# Patient Record
Sex: Female | Born: 1945
Health system: Southern US, Community
[De-identification: ages and names within clinical notes are randomized; demographics above are authoritative.]

## PROBLEM LIST (undated history)

## (undated) DIAGNOSIS — D751 Secondary polycythemia: Secondary | ICD-10-CM

## (undated) DIAGNOSIS — F329 Major depressive disorder, single episode, unspecified: Secondary | ICD-10-CM

## (undated) DIAGNOSIS — N1330 Unspecified hydronephrosis: Secondary | ICD-10-CM

## (undated) DIAGNOSIS — F419 Anxiety disorder, unspecified: Secondary | ICD-10-CM

## (undated) DIAGNOSIS — M549 Dorsalgia, unspecified: Secondary | ICD-10-CM

## (undated) DIAGNOSIS — M5020 Other cervical disc displacement, unspecified cervical region: Secondary | ICD-10-CM

## (undated) DIAGNOSIS — Z720 Tobacco use: Secondary | ICD-10-CM

## (undated) DIAGNOSIS — I10 Essential (primary) hypertension: Secondary | ICD-10-CM

## (undated) DIAGNOSIS — I519 Heart disease, unspecified: Secondary | ICD-10-CM

## (undated) DIAGNOSIS — I2510 Atherosclerotic heart disease of native coronary artery without angina pectoris: Secondary | ICD-10-CM

## (undated) DIAGNOSIS — I739 Peripheral vascular disease, unspecified: Secondary | ICD-10-CM

## (undated) DIAGNOSIS — E7800 Pure hypercholesterolemia, unspecified: Secondary | ICD-10-CM

## (undated) DIAGNOSIS — K635 Polyp of colon: Secondary | ICD-10-CM

## (undated) DIAGNOSIS — R7301 Impaired fasting glucose: Secondary | ICD-10-CM

## (undated) DIAGNOSIS — F32A Depression, unspecified: Secondary | ICD-10-CM

## (undated) DIAGNOSIS — K219 Gastro-esophageal reflux disease without esophagitis: Secondary | ICD-10-CM

## (undated) DIAGNOSIS — R7309 Other abnormal glucose: Secondary | ICD-10-CM

## (undated) DIAGNOSIS — B009 Herpesviral infection, unspecified: Secondary | ICD-10-CM

## (undated) HISTORY — DX: Unspecified hydronephrosis: N13.30

## (undated) HISTORY — PX: ABDOMINAL HYSTERECTOMY: SHX81

## (undated) HISTORY — DX: Heart disease, unspecified: I51.9

## (undated) HISTORY — DX: Anxiety disorder, unspecified: F41.9

## (undated) HISTORY — PX: PARTIAL HYSTERECTOMY: SHX80

## (undated) HISTORY — DX: Pure hypercholesterolemia, unspecified: E78.00

## (undated) HISTORY — DX: Essential (primary) hypertension: I10

## (undated) HISTORY — PX: CHOLECYSTECTOMY: SHX55

## (undated) HISTORY — DX: Dorsalgia, unspecified: M54.9

## (undated) HISTORY — DX: Polyp of colon: K63.5

## (undated) HISTORY — DX: Other abnormal glucose: R73.09

## (undated) HISTORY — DX: Major depressive disorder, single episode, unspecified: F32.9

## (undated) HISTORY — PX: OTHER SURGICAL HISTORY: SHX169

## (undated) HISTORY — DX: Secondary polycythemia: D75.1

## (undated) HISTORY — DX: Herpesviral infection, unspecified: B00.9

## (undated) HISTORY — DX: Atherosclerotic heart disease of native coronary artery without angina pectoris: I25.10

## (undated) HISTORY — DX: Impaired fasting glucose: R73.01

## (undated) HISTORY — DX: Other cervical disc displacement, unspecified cervical region: M50.20

## (undated) HISTORY — DX: Gastro-esophageal reflux disease without esophagitis: K21.9

## (undated) HISTORY — DX: Depression, unspecified: F32.A

## (undated) HISTORY — DX: Peripheral vascular disease, unspecified: I73.9

## (undated) HISTORY — DX: Tobacco use: Z72.0

## (undated) SURGERY — Surgical Case
Anesthesia: *Unknown

---

## 1973-03-25 HISTORY — PX: TOTAL ABDOMINAL HYSTERECTOMY W/ BILATERAL SALPINGOOPHORECTOMY: SHX83

## 1998-03-25 HISTORY — PX: OTHER SURGICAL HISTORY: SHX169

## 2000-10-08 ENCOUNTER — Emergency Department (HOSPITAL_COMMUNITY): Admission: EM | Admit: 2000-10-08 | Discharge: 2000-10-09 | Payer: Self-pay | Admitting: Emergency Medicine

## 2001-01-07 ENCOUNTER — Other Ambulatory Visit: Admission: RE | Admit: 2001-01-07 | Discharge: 2001-01-07 | Payer: Self-pay | Admitting: Family Medicine

## 2001-01-09 ENCOUNTER — Encounter: Payer: Self-pay | Admitting: Family Medicine

## 2001-01-09 ENCOUNTER — Ambulatory Visit (HOSPITAL_COMMUNITY): Admission: RE | Admit: 2001-01-09 | Discharge: 2001-01-09 | Payer: Self-pay | Admitting: Family Medicine

## 2001-03-18 ENCOUNTER — Emergency Department (HOSPITAL_COMMUNITY): Admission: EM | Admit: 2001-03-18 | Discharge: 2001-03-18 | Payer: Self-pay | Admitting: Emergency Medicine

## 2001-03-18 ENCOUNTER — Encounter: Payer: Self-pay | Admitting: Emergency Medicine

## 2001-07-05 ENCOUNTER — Emergency Department (HOSPITAL_COMMUNITY): Admission: EM | Admit: 2001-07-05 | Discharge: 2001-07-05 | Payer: Self-pay | Admitting: Emergency Medicine

## 2001-07-17 ENCOUNTER — Emergency Department (HOSPITAL_COMMUNITY): Admission: EM | Admit: 2001-07-17 | Discharge: 2001-07-17 | Payer: Self-pay | Admitting: Internal Medicine

## 2001-11-10 ENCOUNTER — Ambulatory Visit (HOSPITAL_COMMUNITY): Admission: RE | Admit: 2001-11-10 | Discharge: 2001-11-10 | Payer: Self-pay | Admitting: General Surgery

## 2001-11-15 ENCOUNTER — Emergency Department (HOSPITAL_COMMUNITY): Admission: EM | Admit: 2001-11-15 | Discharge: 2001-11-15 | Payer: Self-pay | Admitting: Internal Medicine

## 2002-03-22 ENCOUNTER — Encounter: Payer: Self-pay | Admitting: Family Medicine

## 2002-03-22 ENCOUNTER — Ambulatory Visit (HOSPITAL_COMMUNITY): Admission: RE | Admit: 2002-03-22 | Discharge: 2002-03-22 | Payer: Self-pay | Admitting: Family Medicine

## 2002-04-09 ENCOUNTER — Encounter: Payer: Self-pay | Admitting: Family Medicine

## 2002-04-09 ENCOUNTER — Ambulatory Visit (HOSPITAL_COMMUNITY): Admission: RE | Admit: 2002-04-09 | Discharge: 2002-04-09 | Payer: Self-pay | Admitting: Family Medicine

## 2002-05-04 ENCOUNTER — Emergency Department (HOSPITAL_COMMUNITY): Admission: EM | Admit: 2002-05-04 | Discharge: 2002-05-04 | Payer: Self-pay | Admitting: *Deleted

## 2002-05-04 ENCOUNTER — Encounter: Payer: Self-pay | Admitting: *Deleted

## 2002-05-05 ENCOUNTER — Emergency Department (HOSPITAL_COMMUNITY): Admission: EM | Admit: 2002-05-05 | Discharge: 2002-05-05 | Payer: Self-pay | Admitting: *Deleted

## 2002-05-05 ENCOUNTER — Encounter: Payer: Self-pay | Admitting: *Deleted

## 2002-07-19 ENCOUNTER — Encounter (HOSPITAL_COMMUNITY): Admission: RE | Admit: 2002-07-19 | Discharge: 2002-08-18 | Payer: Self-pay | Admitting: Family Medicine

## 2002-09-10 ENCOUNTER — Emergency Department (HOSPITAL_COMMUNITY): Admission: EM | Admit: 2002-09-10 | Discharge: 2002-09-10 | Payer: Self-pay | Admitting: *Deleted

## 2002-09-10 ENCOUNTER — Encounter: Payer: Self-pay | Admitting: Emergency Medicine

## 2002-09-12 ENCOUNTER — Encounter: Payer: Self-pay | Admitting: *Deleted

## 2002-09-12 ENCOUNTER — Emergency Department (HOSPITAL_COMMUNITY): Admission: EM | Admit: 2002-09-12 | Discharge: 2002-09-12 | Payer: Self-pay | Admitting: *Deleted

## 2002-09-17 ENCOUNTER — Ambulatory Visit (HOSPITAL_COMMUNITY): Admission: RE | Admit: 2002-09-17 | Discharge: 2002-09-17 | Payer: Self-pay | Admitting: Family Medicine

## 2002-09-17 ENCOUNTER — Encounter: Payer: Self-pay | Admitting: Family Medicine

## 2002-10-10 ENCOUNTER — Emergency Department (HOSPITAL_COMMUNITY): Admission: EM | Admit: 2002-10-10 | Discharge: 2002-10-10 | Payer: Self-pay | Admitting: Emergency Medicine

## 2002-10-10 ENCOUNTER — Encounter: Payer: Self-pay | Admitting: Emergency Medicine

## 2002-11-05 ENCOUNTER — Ambulatory Visit (HOSPITAL_COMMUNITY): Admission: RE | Admit: 2002-11-05 | Discharge: 2002-11-05 | Payer: Self-pay | Admitting: Urology

## 2002-11-05 ENCOUNTER — Encounter: Payer: Self-pay | Admitting: Urology

## 2003-02-14 ENCOUNTER — Inpatient Hospital Stay (HOSPITAL_COMMUNITY): Admission: EM | Admit: 2003-02-14 | Discharge: 2003-02-16 | Payer: Self-pay | Admitting: Emergency Medicine

## 2003-05-10 ENCOUNTER — Emergency Department (HOSPITAL_COMMUNITY): Admission: EM | Admit: 2003-05-10 | Discharge: 2003-05-10 | Payer: Self-pay | Admitting: Emergency Medicine

## 2003-07-28 ENCOUNTER — Ambulatory Visit (HOSPITAL_COMMUNITY): Admission: RE | Admit: 2003-07-28 | Discharge: 2003-07-28 | Payer: Self-pay | Admitting: Cardiology

## 2003-09-01 ENCOUNTER — Ambulatory Visit (HOSPITAL_COMMUNITY): Admission: RE | Admit: 2003-09-01 | Discharge: 2003-09-01 | Payer: Self-pay | Admitting: Family Medicine

## 2004-01-21 ENCOUNTER — Emergency Department (HOSPITAL_COMMUNITY): Admission: EM | Admit: 2004-01-21 | Discharge: 2004-01-21 | Payer: Self-pay | Admitting: Emergency Medicine

## 2004-03-06 ENCOUNTER — Ambulatory Visit: Payer: Self-pay | Admitting: Family Medicine

## 2004-04-07 ENCOUNTER — Emergency Department (HOSPITAL_COMMUNITY): Admission: EM | Admit: 2004-04-07 | Discharge: 2004-04-07 | Payer: Self-pay | Admitting: Emergency Medicine

## 2004-04-10 ENCOUNTER — Ambulatory Visit: Payer: Self-pay | Admitting: Family Medicine

## 2004-04-27 ENCOUNTER — Ambulatory Visit (HOSPITAL_COMMUNITY): Admission: RE | Admit: 2004-04-27 | Discharge: 2004-04-27 | Payer: Self-pay | Admitting: Family Medicine

## 2004-05-11 ENCOUNTER — Ambulatory Visit: Payer: Self-pay | Admitting: Family Medicine

## 2004-06-17 ENCOUNTER — Emergency Department (HOSPITAL_COMMUNITY): Admission: EM | Admit: 2004-06-17 | Discharge: 2004-06-17 | Payer: Self-pay | Admitting: Emergency Medicine

## 2004-07-10 ENCOUNTER — Ambulatory Visit: Payer: Self-pay | Admitting: Family Medicine

## 2004-07-30 ENCOUNTER — Emergency Department (HOSPITAL_COMMUNITY): Admission: EM | Admit: 2004-07-30 | Discharge: 2004-07-30 | Payer: Self-pay | Admitting: Emergency Medicine

## 2004-08-01 ENCOUNTER — Ambulatory Visit: Payer: Self-pay | Admitting: Family Medicine

## 2004-08-07 ENCOUNTER — Ambulatory Visit (HOSPITAL_COMMUNITY): Admission: RE | Admit: 2004-08-07 | Discharge: 2004-08-07 | Payer: Self-pay | Admitting: Family Medicine

## 2004-08-09 ENCOUNTER — Ambulatory Visit (HOSPITAL_COMMUNITY): Admission: RE | Admit: 2004-08-09 | Discharge: 2004-08-09 | Payer: Self-pay | Admitting: Family Medicine

## 2004-10-31 ENCOUNTER — Emergency Department (HOSPITAL_COMMUNITY): Admission: EM | Admit: 2004-10-31 | Discharge: 2004-10-31 | Payer: Self-pay | Admitting: Emergency Medicine

## 2004-12-13 ENCOUNTER — Ambulatory Visit: Payer: Self-pay | Admitting: Family Medicine

## 2005-02-04 ENCOUNTER — Ambulatory Visit: Payer: Self-pay | Admitting: Family Medicine

## 2005-03-01 ENCOUNTER — Ambulatory Visit: Payer: Self-pay | Admitting: Cardiology

## 2005-03-01 ENCOUNTER — Inpatient Hospital Stay (HOSPITAL_COMMUNITY): Admission: AD | Admit: 2005-03-01 | Discharge: 2005-03-02 | Payer: Self-pay | Admitting: Cardiology

## 2005-03-01 ENCOUNTER — Encounter: Payer: Self-pay | Admitting: Emergency Medicine

## 2005-03-06 ENCOUNTER — Ambulatory Visit: Payer: Self-pay | Admitting: Family Medicine

## 2005-04-05 ENCOUNTER — Ambulatory Visit: Payer: Self-pay | Admitting: Internal Medicine

## 2005-05-17 ENCOUNTER — Ambulatory Visit: Payer: Self-pay | Admitting: Family Medicine

## 2005-07-05 ENCOUNTER — Other Ambulatory Visit: Admission: RE | Admit: 2005-07-05 | Discharge: 2005-07-05 | Payer: Self-pay | Admitting: Family Medicine

## 2005-07-05 ENCOUNTER — Encounter: Payer: Self-pay | Admitting: Family Medicine

## 2005-07-05 ENCOUNTER — Ambulatory Visit: Payer: Self-pay | Admitting: Family Medicine

## 2005-08-05 ENCOUNTER — Ambulatory Visit (HOSPITAL_COMMUNITY): Admission: RE | Admit: 2005-08-05 | Discharge: 2005-08-05 | Payer: Self-pay | Admitting: Podiatry

## 2005-10-31 ENCOUNTER — Ambulatory Visit: Payer: Self-pay | Admitting: Family Medicine

## 2006-01-06 ENCOUNTER — Ambulatory Visit: Payer: Self-pay | Admitting: Family Medicine

## 2006-02-06 ENCOUNTER — Ambulatory Visit: Payer: Self-pay | Admitting: Family Medicine

## 2006-05-26 ENCOUNTER — Ambulatory Visit: Payer: Self-pay | Admitting: Family Medicine

## 2006-06-05 ENCOUNTER — Encounter: Payer: Self-pay | Admitting: Family Medicine

## 2006-06-05 ENCOUNTER — Ambulatory Visit (HOSPITAL_COMMUNITY): Admission: RE | Admit: 2006-06-05 | Discharge: 2006-06-05 | Payer: Self-pay | Admitting: Family Medicine

## 2006-06-05 LAB — CONVERTED CEMR LAB
Albumin: 4.5 g/dL (ref 3.5–5.2)
Alkaline Phosphatase: 115 units/L (ref 39–117)
BUN: 13 mg/dL (ref 6–23)
CO2: 27 meq/L (ref 19–32)
Total Bilirubin: 0.5 mg/dL (ref 0.3–1.2)
Triglycerides: 201 mg/dL — ABNORMAL HIGH (ref <150)

## 2006-06-23 ENCOUNTER — Ambulatory Visit (HOSPITAL_COMMUNITY): Admission: RE | Admit: 2006-06-23 | Discharge: 2006-06-23 | Payer: Self-pay | Admitting: Gastroenterology

## 2006-06-23 ENCOUNTER — Ambulatory Visit: Payer: Self-pay | Admitting: Gastroenterology

## 2006-06-23 HISTORY — PX: COLONOSCOPY: SHX174

## 2006-06-23 LAB — HM COLONOSCOPY: HM Colonoscopy: NORMAL

## 2006-09-29 ENCOUNTER — Ambulatory Visit: Payer: Self-pay | Admitting: Family Medicine

## 2006-10-13 ENCOUNTER — Ambulatory Visit (HOSPITAL_COMMUNITY): Admission: RE | Admit: 2006-10-13 | Discharge: 2006-10-13 | Payer: Self-pay | Admitting: Family Medicine

## 2006-11-28 ENCOUNTER — Encounter: Payer: Self-pay | Admitting: Family Medicine

## 2006-11-28 LAB — CONVERTED CEMR LAB
AST: 17 units/L (ref 0–37)
Albumin: 4.7 g/dL (ref 3.5–5.2)
Alkaline Phosphatase: 108 units/L (ref 39–117)
Bilirubin, Direct: 0.1 mg/dL (ref 0.0–0.3)
CO2: 25 meq/L (ref 19–32)
Cholesterol: 139 mg/dL (ref 0–200)
Creatinine, Ser: 0.92 mg/dL (ref 0.40–1.20)
Glucose, Bld: 98 mg/dL (ref 70–99)
Indirect Bilirubin: 0.4 mg/dL (ref 0.0–0.9)
Total Bilirubin: 0.5 mg/dL (ref 0.3–1.2)
Total CHOL/HDL Ratio: 3.7
Triglycerides: 254 mg/dL — ABNORMAL HIGH (ref <150)
VLDL: 51 mg/dL — ABNORMAL HIGH (ref 0–40)

## 2006-12-02 ENCOUNTER — Ambulatory Visit: Payer: Self-pay | Admitting: Family Medicine

## 2007-01-23 ENCOUNTER — Ambulatory Visit: Payer: Self-pay | Admitting: Family Medicine

## 2007-01-23 LAB — CONVERTED CEMR LAB: Troponin I: 0.04 ng/mL (ref <0.06)

## 2007-03-05 ENCOUNTER — Ambulatory Visit: Payer: Self-pay | Admitting: Family Medicine

## 2007-03-05 LAB — CONVERTED CEMR LAB
BUN: 12 mg/dL (ref 6–23)
CO2: 24 meq/L (ref 19–32)
Chloride: 102 meq/L (ref 96–112)
Cholesterol: 148 mg/dL (ref 0–200)
Glucose, Bld: 98 mg/dL (ref 70–99)
HDL: 54 mg/dL (ref >39)
LDL Cholesterol: 65 mg/dL (ref 0–99)
Potassium: 3.8 meq/L (ref 3.5–5.3)
Total Bilirubin: 0.5 mg/dL (ref 0.3–1.2)
Total CHOL/HDL Ratio: 2.7
Triglycerides: 147 mg/dL (ref <150)

## 2007-03-26 ENCOUNTER — Encounter: Payer: Self-pay | Admitting: Family Medicine

## 2007-06-03 ENCOUNTER — Ambulatory Visit: Payer: Self-pay | Admitting: Family Medicine

## 2007-06-03 LAB — CONVERTED CEMR LAB: Retic Ct Pct: 1.6 % (ref 0.4–3.1)

## 2007-06-12 ENCOUNTER — Encounter: Payer: Self-pay | Admitting: Family Medicine

## 2007-06-12 DIAGNOSIS — F329 Major depressive disorder, single episode, unspecified: Secondary | ICD-10-CM

## 2007-06-12 DIAGNOSIS — E785 Hyperlipidemia, unspecified: Secondary | ICD-10-CM

## 2007-06-12 DIAGNOSIS — M810 Age-related osteoporosis without current pathological fracture: Secondary | ICD-10-CM

## 2007-06-12 DIAGNOSIS — I10 Essential (primary) hypertension: Secondary | ICD-10-CM

## 2007-06-12 DIAGNOSIS — F419 Anxiety disorder, unspecified: Secondary | ICD-10-CM

## 2007-06-12 DIAGNOSIS — M549 Dorsalgia, unspecified: Secondary | ICD-10-CM | POA: Insufficient documentation

## 2007-09-07 ENCOUNTER — Ambulatory Visit: Payer: Self-pay | Admitting: Family Medicine

## 2007-09-07 ENCOUNTER — Encounter: Payer: Self-pay | Admitting: Family Medicine

## 2007-09-07 DIAGNOSIS — J301 Allergic rhinitis due to pollen: Secondary | ICD-10-CM | POA: Insufficient documentation

## 2007-09-07 LAB — CONVERTED CEMR LAB
ALT: 16 units/L (ref 0–35)
Albumin: 5 g/dL (ref 3.5–5.2)
Alkaline Phosphatase: 87 units/L (ref 39–117)
Chloride: 102 meq/L (ref 96–112)
Creatinine, Ser: 0.85 mg/dL (ref 0.40–1.20)
Glucose, Bld: 97 mg/dL (ref 70–99)
Indirect Bilirubin: 0.3 mg/dL (ref 0.0–0.9)
Potassium: 4.1 meq/L (ref 3.5–5.3)
Triglycerides: 203 mg/dL — ABNORMAL HIGH (ref <150)
VLDL: 41 mg/dL — ABNORMAL HIGH (ref 0–40)

## 2007-11-27 ENCOUNTER — Encounter: Payer: Self-pay | Admitting: Family Medicine

## 2007-12-08 ENCOUNTER — Ambulatory Visit: Payer: Self-pay | Admitting: Family Medicine

## 2007-12-09 ENCOUNTER — Encounter: Payer: Self-pay | Admitting: Family Medicine

## 2007-12-09 LAB — CONVERTED CEMR LAB
AST: 17 units/L (ref 0–37)
BUN: 13 mg/dL (ref 6–23)
Basophils Absolute: 0.1 10*3/uL (ref 0.0–0.1)
Basophils Relative: 1 % (ref 0–1)
Bilirubin, Direct: 0.1 mg/dL (ref 0.0–0.3)
CO2: 26 meq/L (ref 19–32)
Calcium: 11.3 mg/dL — ABNORMAL HIGH (ref 8.4–10.5)
Eosinophils Absolute: 0.2 10*3/uL (ref 0.0–0.7)
Eosinophils Relative: 2 % (ref 0–5)
Glucose, Bld: 108 mg/dL — ABNORMAL HIGH (ref 70–99)
HCT: 52.7 % — ABNORMAL HIGH (ref 36.0–46.0)
Hemoglobin: 17.5 g/dL — ABNORMAL HIGH (ref 12.0–15.0)
MCHC: 33.2 g/dL (ref 30.0–36.0)
MCV: 98.3 fL (ref 78.0–100.0)
Monocytes Absolute: 0.7 10*3/uL (ref 0.1–1.0)
Monocytes Relative: 6 % (ref 3–12)
Neutrophils Relative %: 56 % (ref 43–77)
RDW: 15 % (ref 11.5–15.5)
Sodium: 141 meq/L (ref 135–145)
Total Bilirubin: 0.5 mg/dL (ref 0.3–1.2)
Total CHOL/HDL Ratio: 6.9
WBC: 10.3 10*3/uL (ref 4.0–10.5)

## 2007-12-16 ENCOUNTER — Encounter: Payer: Self-pay | Admitting: Family Medicine

## 2007-12-16 ENCOUNTER — Ambulatory Visit: Payer: Self-pay | Admitting: Cardiology

## 2008-01-22 ENCOUNTER — Encounter: Payer: Self-pay | Admitting: Family Medicine

## 2008-01-25 ENCOUNTER — Telehealth: Payer: Self-pay | Admitting: Family Medicine

## 2008-04-12 ENCOUNTER — Ambulatory Visit: Payer: Self-pay | Admitting: Family Medicine

## 2008-04-13 ENCOUNTER — Encounter: Payer: Self-pay | Admitting: Family Medicine

## 2008-04-13 LAB — CONVERTED CEMR LAB
ALT: 17 units/L (ref 0–35)
AST: 19 units/L (ref 0–37)
Albumin: 4.6 g/dL (ref 3.5–5.2)
Alkaline Phosphatase: 126 units/L — ABNORMAL HIGH (ref 39–117)
BUN: 13 mg/dL (ref 6–23)
Bilirubin, Direct: 0.1 mg/dL (ref 0.0–0.3)
CO2: 25 meq/L (ref 19–32)
Calcium: 10.1 mg/dL (ref 8.4–10.5)
Cholesterol: 206 mg/dL — ABNORMAL HIGH (ref 0–200)
Total Bilirubin: 0.6 mg/dL (ref 0.3–1.2)
Triglycerides: 254 mg/dL — ABNORMAL HIGH (ref <150)

## 2008-04-18 ENCOUNTER — Encounter: Payer: Self-pay | Admitting: Family Medicine

## 2008-04-18 ENCOUNTER — Ambulatory Visit: Payer: Self-pay | Admitting: Cardiology

## 2008-04-25 ENCOUNTER — Ambulatory Visit: Payer: Self-pay

## 2008-04-25 ENCOUNTER — Encounter: Payer: Self-pay | Admitting: Family Medicine

## 2008-04-28 ENCOUNTER — Ambulatory Visit: Payer: Self-pay | Admitting: Cardiology

## 2008-04-28 ENCOUNTER — Encounter (HOSPITAL_COMMUNITY): Admission: RE | Admit: 2008-04-28 | Discharge: 2008-05-28 | Payer: Self-pay | Admitting: Cardiology

## 2008-04-28 ENCOUNTER — Encounter: Payer: Self-pay | Admitting: Cardiology

## 2008-05-10 ENCOUNTER — Ambulatory Visit: Payer: Self-pay | Admitting: Cardiology

## 2008-06-20 ENCOUNTER — Encounter: Payer: Self-pay | Admitting: Family Medicine

## 2008-06-21 ENCOUNTER — Encounter: Payer: Self-pay | Admitting: Family Medicine

## 2008-06-23 ENCOUNTER — Encounter: Payer: Self-pay | Admitting: Family Medicine

## 2008-07-12 ENCOUNTER — Ambulatory Visit: Payer: Self-pay | Admitting: Family Medicine

## 2008-07-12 ENCOUNTER — Telehealth: Payer: Self-pay | Admitting: Family Medicine

## 2008-07-18 ENCOUNTER — Encounter: Payer: Self-pay | Admitting: Family Medicine

## 2008-07-18 ENCOUNTER — Ambulatory Visit (HOSPITAL_COMMUNITY): Admission: RE | Admit: 2008-07-18 | Discharge: 2008-07-18 | Payer: Self-pay | Admitting: Family Medicine

## 2008-08-23 ENCOUNTER — Inpatient Hospital Stay (HOSPITAL_COMMUNITY): Admission: EM | Admit: 2008-08-23 | Discharge: 2008-08-26 | Payer: Self-pay | Admitting: Emergency Medicine

## 2008-08-23 ENCOUNTER — Ambulatory Visit: Payer: Self-pay | Admitting: Family Medicine

## 2008-09-01 ENCOUNTER — Ambulatory Visit: Payer: Self-pay | Admitting: Family Medicine

## 2008-09-05 ENCOUNTER — Ambulatory Visit: Payer: Self-pay | Admitting: Family Medicine

## 2008-09-05 ENCOUNTER — Telehealth: Payer: Self-pay | Admitting: Family Medicine

## 2008-09-12 ENCOUNTER — Encounter: Payer: Self-pay | Admitting: Family Medicine

## 2008-09-22 ENCOUNTER — Telehealth: Payer: Self-pay | Admitting: Family Medicine

## 2008-11-14 ENCOUNTER — Encounter: Payer: Self-pay | Admitting: Family Medicine

## 2008-12-15 ENCOUNTER — Encounter: Payer: Self-pay | Admitting: Family Medicine

## 2008-12-28 ENCOUNTER — Encounter (INDEPENDENT_AMBULATORY_CARE_PROVIDER_SITE_OTHER): Payer: Self-pay | Admitting: *Deleted

## 2008-12-28 ENCOUNTER — Ambulatory Visit: Payer: Self-pay | Admitting: Family Medicine

## 2008-12-28 LAB — CONVERTED CEMR LAB
AST: 19 units/L
Alkaline Phosphatase: 133 units/L
BUN: 15 mg/dL
Creatinine, Ser: 0.77 mg/dL
Eosinophils Absolute: 0.1 10*3/uL
Glucose, Bld: 94 mg/dL
HCT: 52 %
HDL: 44 mg/dL
Lymphocytes Relative: 43 %
MCV: 95.4 fL
Monocytes Absolute: 1 10*3/uL
Monocytes Relative: 7 %
Potassium: 5.2 meq/L
Sodium: 142 meq/L
Triglycerides: 290 mg/dL
WBC: 10.1 10*3/uL

## 2008-12-29 ENCOUNTER — Encounter: Payer: Self-pay | Admitting: Family Medicine

## 2008-12-30 HISTORY — DX: Hypercalcemia: E83.52

## 2008-12-30 LAB — CONVERTED CEMR LAB
Albumin: 4.7 g/dL (ref 3.5–5.2)
Alkaline Phosphatase: 133 units/L — ABNORMAL HIGH (ref 39–117)
CO2: 24 meq/L (ref 19–32)
Chloride: 104 meq/L (ref 96–112)
Creatinine, Ser: 0.77 mg/dL (ref 0.40–1.20)
Eosinophils Absolute: 0.1 10*3/uL (ref 0.0–0.7)
Eosinophils Relative: 1 % (ref 0–5)
HCT: 52 % — ABNORMAL HIGH (ref 36.0–46.0)
HDL: 44 mg/dL (ref >39)
Indirect Bilirubin: 0.3 mg/dL (ref 0.0–0.9)
Lymphocytes Relative: 43 % (ref 12–46)
Monocytes Absolute: 0.7 10*3/uL (ref 0.1–1.0)
Monocytes Relative: 7 % (ref 3–12)
Neutro Abs: 4.8 10*3/uL (ref 1.7–7.7)
Neutrophils Relative %: 48 % (ref 43–77)
RBC: 5.45 M/uL — ABNORMAL HIGH (ref 3.87–5.11)
RDW: 14.8 % (ref 11.5–15.5)
Total Bilirubin: 0.4 mg/dL (ref 0.3–1.2)
Total Protein: 7.8 g/dL (ref 6.0–8.3)
Triglycerides: 290 mg/dL — ABNORMAL HIGH (ref <150)
WBC: 10.1 10*3/uL (ref 4.0–10.5)

## 2009-01-04 LAB — CONVERTED CEMR LAB: PTH: 105.6 pg/mL — ABNORMAL HIGH (ref 14.0–72.0)

## 2009-01-06 ENCOUNTER — Telehealth: Payer: Self-pay | Admitting: Family Medicine

## 2009-01-20 ENCOUNTER — Ambulatory Visit: Payer: Self-pay | Admitting: Endocrinology

## 2009-01-20 DIAGNOSIS — E559 Vitamin D deficiency, unspecified: Secondary | ICD-10-CM

## 2009-01-20 DIAGNOSIS — E210 Primary hyperparathyroidism: Secondary | ICD-10-CM

## 2009-01-23 LAB — CONVERTED CEMR LAB: PTH: 78.4 pg/mL — ABNORMAL HIGH (ref 14.0–72.0)

## 2009-02-18 ENCOUNTER — Emergency Department (HOSPITAL_COMMUNITY): Admission: EM | Admit: 2009-02-18 | Discharge: 2009-02-18 | Payer: Self-pay | Admitting: Emergency Medicine

## 2009-04-07 ENCOUNTER — Ambulatory Visit: Payer: Self-pay | Admitting: Family Medicine

## 2009-04-07 DIAGNOSIS — R1310 Dysphagia, unspecified: Secondary | ICD-10-CM | POA: Insufficient documentation

## 2009-04-07 DIAGNOSIS — R1900 Intra-abdominal and pelvic swelling, mass and lump, unspecified site: Secondary | ICD-10-CM | POA: Insufficient documentation

## 2009-04-10 ENCOUNTER — Telehealth: Payer: Self-pay | Admitting: Family Medicine

## 2009-04-12 ENCOUNTER — Encounter (INDEPENDENT_AMBULATORY_CARE_PROVIDER_SITE_OTHER): Payer: Self-pay | Admitting: *Deleted

## 2009-04-12 LAB — CONVERTED CEMR LAB
ALT: 19 units/L
ALT: 19 units/L (ref 0–35)
Alkaline Phosphatase: 114 units/L
Alkaline Phosphatase: 114 units/L (ref 39–117)
BUN: 10 mg/dL
BUN: 10 mg/dL (ref 6–23)
Bilirubin, Direct: 0.1 mg/dL
Bilirubin, Direct: 0.1 mg/dL (ref 0.0–0.3)
CO2: 25 meq/L
CO2: 25 meq/L (ref 19–32)
Calcium: 9.9 mg/dL
Calcium: 9.9 mg/dL (ref 8.4–10.5)
Chloride: 106 meq/L (ref 96–112)
Cholesterol: 156 mg/dL (ref 0–200)
Creatinine, Ser: 0.79 mg/dL
LDL Cholesterol: 65 mg/dL (ref 0–99)
Potassium: 4 meq/L
Potassium: 4 meq/L (ref 3.5–5.3)
Total Bilirubin: 0.4 mg/dL (ref 0.3–1.2)
Triglycerides: 257 mg/dL
Triglycerides: 257 mg/dL — ABNORMAL HIGH (ref <150)
Vit D, 25-Hydroxy: 40 ng/mL (ref 30–89)

## 2009-06-10 ENCOUNTER — Emergency Department (HOSPITAL_COMMUNITY): Admission: EM | Admit: 2009-06-10 | Discharge: 2009-06-10 | Payer: Self-pay | Admitting: Emergency Medicine

## 2009-07-10 ENCOUNTER — Ambulatory Visit: Payer: Self-pay | Admitting: Family Medicine

## 2009-07-17 ENCOUNTER — Telehealth: Payer: Self-pay | Admitting: Family Medicine

## 2009-08-14 ENCOUNTER — Telehealth: Payer: Self-pay | Admitting: Family Medicine

## 2009-09-13 ENCOUNTER — Encounter (INDEPENDENT_AMBULATORY_CARE_PROVIDER_SITE_OTHER): Payer: Self-pay | Admitting: *Deleted

## 2009-09-17 DIAGNOSIS — K219 Gastro-esophageal reflux disease without esophagitis: Secondary | ICD-10-CM

## 2009-09-17 DIAGNOSIS — F17200 Nicotine dependence, unspecified, uncomplicated: Secondary | ICD-10-CM

## 2009-09-17 DIAGNOSIS — I739 Peripheral vascular disease, unspecified: Secondary | ICD-10-CM | POA: Insufficient documentation

## 2009-09-17 DIAGNOSIS — I2510 Atherosclerotic heart disease of native coronary artery without angina pectoris: Secondary | ICD-10-CM

## 2009-09-18 ENCOUNTER — Ambulatory Visit: Payer: Self-pay | Admitting: Cardiology

## 2009-09-18 DIAGNOSIS — R7309 Other abnormal glucose: Secondary | ICD-10-CM

## 2009-10-10 ENCOUNTER — Ambulatory Visit: Payer: Self-pay | Admitting: Family Medicine

## 2009-10-11 ENCOUNTER — Encounter: Payer: Self-pay | Admitting: Family Medicine

## 2009-10-12 ENCOUNTER — Encounter: Payer: Self-pay | Admitting: Family Medicine

## 2009-10-12 LAB — CONVERTED CEMR LAB
AST: 17 units/L (ref 0–37)
Albumin: 4.6 g/dL (ref 3.5–5.2)
BUN: 10 mg/dL (ref 6–23)
Cholesterol: 317 mg/dL — ABNORMAL HIGH (ref 0–200)
Creatinine, Ser: 0.83 mg/dL (ref 0.40–1.20)
Glucose, Bld: 104 mg/dL — ABNORMAL HIGH (ref 70–99)
Indirect Bilirubin: 0.3 mg/dL (ref 0.0–0.9)
Potassium: 4.3 meq/L (ref 3.5–5.3)
Sodium: 141 meq/L (ref 135–145)
Total CHOL/HDL Ratio: 7.7

## 2009-10-13 ENCOUNTER — Ambulatory Visit (HOSPITAL_COMMUNITY): Admission: RE | Admit: 2009-10-13 | Discharge: 2009-10-13 | Payer: Self-pay | Admitting: Family Medicine

## 2010-01-01 ENCOUNTER — Telehealth: Payer: Self-pay | Admitting: Family Medicine

## 2010-01-17 ENCOUNTER — Ambulatory Visit: Payer: Self-pay | Admitting: Family Medicine

## 2010-02-06 ENCOUNTER — Telehealth: Payer: Self-pay | Admitting: Family Medicine

## 2010-03-13 ENCOUNTER — Encounter (INDEPENDENT_AMBULATORY_CARE_PROVIDER_SITE_OTHER): Payer: Self-pay | Admitting: *Deleted

## 2010-03-13 LAB — CONVERTED CEMR LAB
AST: 21 units/L
Albumin: 4.7 g/dL
Alkaline Phosphatase: 127 units/L
Basophils Absolute: 0.1 10*3/uL
Bilirubin, Direct: 0.1 mg/dL
Cholesterol: 245 mg/dL
Eosinophils Relative: 1 %
Glucose, Bld: 107 mg/dL
HDL: 41 mg/dL
LDL Cholesterol: 157 mg/dL
Lymphocytes Relative: 46 %
Lymphs Abs: 3.3 10*3/uL
Monocytes Absolute: 0.5 10*3/uL
Monocytes Relative: 8 %
Platelets: 273 10*3/uL
Potassium: 4.1 meq/L
RBC: 5.42 M/uL
Sodium: 140 meq/L
Total Protein: 7.4 g/dL
Triglycerides: 235 mg/dL
WBC: 7.2 10*3/uL

## 2010-03-14 LAB — CONVERTED CEMR LAB
AST: 21 units/L (ref 0–37)
Alkaline Phosphatase: 127 units/L — ABNORMAL HIGH (ref 39–117)
Basophils Relative: 1 % (ref 0–1)
Bilirubin, Direct: 0.1 mg/dL (ref 0.0–0.3)
Chloride: 104 meq/L (ref 96–112)
Cholesterol: 245 mg/dL — ABNORMAL HIGH (ref 0–200)
Eosinophils Absolute: 0.1 10*3/uL (ref 0.0–0.7)
Eosinophils Relative: 1 % (ref 0–5)
HCT: 51.5 % — ABNORMAL HIGH (ref 36.0–46.0)
HDL: 41 mg/dL (ref >39)
LDL Cholesterol: 157 mg/dL — ABNORMAL HIGH (ref 0–99)
MCV: 95 fL (ref 78.0–100.0)
Monocytes Absolute: 0.5 10*3/uL (ref 0.1–1.0)
Monocytes Relative: 8 % (ref 3–12)
Potassium: 4.1 meq/L (ref 3.5–5.3)
RDW: 15 % (ref 11.5–15.5)
Sodium: 140 meq/L (ref 135–145)
TSH: 2.37 microintl units/mL (ref 0.350–4.500)
Total Bilirubin: 0.5 mg/dL (ref 0.3–1.2)
VLDL: 47 mg/dL — ABNORMAL HIGH (ref 0–40)

## 2010-04-14 ENCOUNTER — Encounter: Payer: Self-pay | Admitting: Family Medicine

## 2010-04-15 ENCOUNTER — Encounter: Payer: Self-pay | Admitting: Family Medicine

## 2010-04-15 ENCOUNTER — Encounter: Payer: Self-pay | Admitting: Neurology

## 2010-04-16 ENCOUNTER — Encounter: Payer: Self-pay | Admitting: Family Medicine

## 2010-04-24 NOTE — Letter (Signed)
Summary: PHONE NOTES  PHONE NOTES   Imported By: Lind Guest 09/01/2009 08:58:28  _____________________________________________________________________  External Attachment:    Type:   Image     Comment:   External Document

## 2010-04-24 NOTE — Letter (Signed)
Summary: HISTORY AND PHYSICAL  HISTORY AND PHYSICAL   Imported By: Lind Guest 09/01/2009 08:51:19  _____________________________________________________________________  External Attachment:    Type:   Image     Comment:   External Document

## 2010-04-24 NOTE — Progress Notes (Signed)
Summary: meds  Phone Note Call from Patient   Summary of Call: pt states she has appt with [facility] on the 09/18/2009. she needs her meds. [phone] Initial call taken by: Rudene Anda,  July 17, 2009 8:27 AM  Follow-up for Phone Call        She was wanting her crestor, went to the pharmacy and it wasn't there. Told her [facility] sent it in on the 20th and she said she'd call them back or call them if it wasn't there Follow-up by: Everitt Amber LPN,  July 17, 2009 10:08 AM

## 2010-04-24 NOTE — Letter (Signed)
Summary: OFFICE NOTES  OFFICE NOTES   Imported By: Lind Guest 09/01/2009 08:57:50  _____________________________________________________________________  External Attachment:    Type:   Image     Comment:   External Document

## 2010-04-24 NOTE — Letter (Signed)
Summary: CONSULTS  CONSULTS   Imported By: Lind Guest 09/01/2009 08:48:46  _____________________________________________________________________  External Attachment:    Type:   Image     Comment:   External Document

## 2010-04-24 NOTE — Progress Notes (Signed)
Summary: back pain  Phone Note Call from Patient   Summary of Call: the pain medicine that doc gave her for back isn't doing any good. would like to get something else called into pharm. [phone] Initial call taken by: Rudene Anda,  January 01, 2010 10:32 AM  Follow-up for Phone Call        I advised her that she was already on the highest dose of pain meds. 10/500mg  qid. She is taking them 4 times a day and during the night too. I told her she can come in to be seen because she was already on the max dose. She wants to know if she can have something stronger sent in. I tried to get her to schedule app earlier but she said she can't get up here. Is it Ok to tell her that nothing stronger can be given without OV period? Follow-up by: Everitt Amber LPN,  January 01, 2010 10:47 AM  Additional Follow-up for Phone Call Additional follow up Details #1::        yes, and actually irecommend she see ortho about the back pain,. because I have nothing more to offer, let me know which doc she will see so i can refer Additional Follow-up by: [name],  January 01, 2010 12:12 PM    Additional Follow-up for Phone Call Additional follow up Details #2::    Patient was asleep when I called and they will get her to call me back  Follow-up by: Everitt Amber LPN,  January 01, 2010 3:59 PM  Additional Follow-up for Phone Call Additional follow up Details #3:: Details for Additional Follow-up Action Taken: wants referral to ortho Additional Follow-up by: Everitt Amber LPN,  January 02, 2010 9:36 AM  pt caqlled back and stated that she wasn't going to do referral to ortho and she would just see [name] like she always does. I told her I would schedule and she said no. Rudene Anda  January 02, 2010 4:38 PM

## 2010-04-24 NOTE — Assessment & Plan Note (Signed)
Summary: OV   Vital Signs:  Patient profile:   65 year old female Menstrual status:  hysterectomy Height:      63 inches Weight:      133 pounds BMI:     23.65 O2 Sat:      96 % Pulse rate:   87 / minute Pulse rhythm:   regular Resp:     16 per minute BP sitting:   130 / 74 Cuff size:   regular  Vitals Entered By: Everitt Amber (April 07, 2009 10:18 AM) CC: right hip hurting her for a week now Pain Assessment Patient in pain? yes     Location: right hip pain Intensity: 8 Type: aching Onset of pain  a week , worse in the am   Primary Care Provider:  [name]  CC:  right hip hurting her for a week now.  History of Present Illness: increased low back pain radiating down the right posterior thigh in the past 1 week, no aggravating factor noted. Denies lower ext weakness or numbness. The pt is experiencing inc stress and depression since her sisiter who passed less than 5 months ago has a b/day tomorrow and she feels this will be hard for her.Still unwilling totake antidepressants, states she is "afraid of them" She reporrts concern over an epigastric swelling which she has noted in recent times. Her smoking is unchanged , she has no plans of quitting,and had a chronic smker's cough. She reports increased nasal congestion and postnasal drainage , but denies fevr or chills.   Preventive Screening-Counseling & Management  Alcohol-Tobacco     Smoking Cessation Counseling: yes  Current Medications (verified): 1)  Xanax 0.5 Mg  Tabs (Alprazolam) .... Take 1 Tablet By Mouth Four Times A Day 2)  Aspirin 81 Mg  Tbec (Aspirin) .... Take 1 Tablet By Mouth Once A Day 3)  Amlodipine Besylate 5 Mg  Tabs (Amlodipine Besylate) .... Take 1 Tab By Mouth At Bedtime 4)  Hydrocodone-Acetaminophen 10-500 Mg Tabs (Hydrocodone-Acetaminophen) .... One Tab By Mouth Qid Prn 5)  Crestor 40 Mg Tabs (Rosuvastatin Calcium) .Marland Kitchen.. 1 Once Daily 6)  Fosamax 70 Mg Tabs (Alendronate Sodium) ....  One Tab By Mouth Every Week 7)  Oscal 500/200 D-3 500-200 Mg-Unit Tabs (Calcium-Vitamin D) .... One Tab By Mouth Tid 8)  Vitamin D (Ergocalciferol) 50000 Unit Caps (Ergocalciferol) .... One Cap By Mouth Q Week 9)  Omega 3-6-9 1000 Mg 10)  Alprazolam 0.5 Mg Tabs (Alprazolam) .Marland Kitchen.. 1 Tab By Mouth 4 Times Daily As Needed For Anxiety  Allergies (verified): No Known Drug Allergies  Review of Systems      See HPI Eyes:  Denies blurring and discharge. CV:  Denies chest pain or discomfort, palpitations, and swelling of feet. Resp:  See HPI. GI:  Complains of abdominal pain; solid and liquid dyspahagia,uncontrolled gerD, AND AQBDOMINAL SWELLING LOCALISED, LIKE A MASS. GU:  Denies dysuria and urinary frequency. MS:  See HPI. Neuro:  Denies headaches, seizures, and sensation of room spinning. Endo:  Denies cold intolerance, excessive hunger, excessive thirst, excessive urination, heat intolerance, polyuria, and weight change. Heme:  Denies abnormal bruising and bleeding. Allergy:  Denies hives or rash and sneezing.  Physical Exam  General:  Well-developed,well-nourished,in no acute distress; alert,appropriate and cooperative throughout examination HEENT: No facial asymmetry,  EOMI, No sinus tenderness, TM's Clear, oropharynx  pink and moist. erythema and edma of nasal mucosa  Chest: Clear to auscultation bilaterally. decreased air entry bilaterally CVS: S1, S2, No murmurs,  No S3.   Abd: Soft, Nontender. epigastric mass, and possible ventral hernia MS: decreased ROM spine,adequate in  hips, shoulders and knees.  Ext: No edema.   CNS: CN 2-12 intact, power tone and sensation normal throughout.   Skin: Intact, no visible lesions or rashes.  Psych: Good eye contact, normal affect.  Memory intact, not anxious or depressed appearing.     Impression & Recommendations:  Problem # 1:  DYSPEPSIA (ICD-536.8) Assessment Deteriorated  Orders: TLB-H. Pylori Abs(Helicobacter Pylori)  ([id number])prevacid given to be followed by omeprazole  Problem # 2:  OTHER DYSPHAGIA (UEA-540.98) Assessment: Deteriorated  Orders: Gastroenterology Referral (GI)  Problem # 3:  ABDOMINAL MASS (ICD-789.30) Assessment: Comment Only  Orders: Radiology Referral (Radiology)  Problem # 4:  NICOTINE ADDICTION (ICD-305.1) Assessment: Unchanged  Encouraged smoking cessation and discussed different methods for smoking cessation.   Problem # 5:  HYPERTENSION (ICD-401.9) Assessment: Unchanged  Her updated medication list for this problem includes:    Amlodipine Besylate 5 Mg Tabs (Amlodipine besylate) .Marland Kitchen... Take 1 tab by mouth at bedtime  Orders: T-Basic Metabolic Panel ([id number])  BP today: 130/74 Prior BP: 132/64 (01/20/2009)  Labs Reviewed: K+: 5.2 (12/28/2008) Creat: : 0.77 (12/28/2008)   Chol: 248 (12/28/2008)   HDL: 44 (12/28/2008)   LDL: 146 (12/28/2008)   TG: 290 (12/28/2008)  Problem # 6:  HYPERLIPIDEMIA (ICD-272.4) Assessment: Comment Only  Her updated medication list for this problem includes:    Crestor 40 Mg Tabs (Rosuvastatin calcium) .Marland Kitchen... 1 once daily  Orders: T-Hepatic Function [id number]) T-Lipid Profile [id number])  Labs Reviewed: SGOT: 19 (12/28/2008)   SGPT: 18 (12/28/2008)   HDL:44 (12/28/2008), 35 (04/13/2008)  LDL:146 (12/28/2008), 120 (04/13/2008)  Chol:248 (12/28/2008), 206 (04/13/2008)  Trig:290 (12/28/2008), 254 (04/13/2008)  Complete Medication List: 1)  Xanax 0.5 Mg Tabs (Alprazolam) .... Take 1 tablet by mouth four times a day 2)  Aspirin 81 Mg Tbec (Aspirin) .... Take 1 tablet by mouth once a day 3)  Amlodipine Besylate 5 Mg Tabs (Amlodipine besylate) .... Take 1 tab by mouth at bedtime 4)  Hydrocodone-acetaminophen 10-500 Mg Tabs (Hydrocodone-acetaminophen) .... One tab by mouth qid prn 5)  Crestor 40 Mg Tabs (Rosuvastatin calcium) .Marland Kitchen.. 1 once daily 6)  Fosamax 70 Mg Tabs (Alendronate sodium) .... One tab by mouth every week 7)   Oscal 500/200 D-3 500-200 Mg-unit Tabs (Calcium-vitamin d) .... One tab by mouth tid 8)  Vitamin D (ergocalciferol) 50000 Unit Caps (Ergocalciferol) .... One cap by mouth q week 9)  Omega 3-6-9 1000 Mg  10)  Alprazolam 0.5 Mg Tabs (Alprazolam) .Marland Kitchen.. 1 tab by mouth 4 times daily as needed for anxiety 11)  Omeprazole 40 Mg Cpdr (Omeprazole) .... Take 1 capsule by mouth once a day 12)  Prevacid 30 Mg Cpdr (Lansoprazole) .... Take 1 tablet by mouth once a day  Other Orders: T-Vitamin D (25-Hydroxy) ([id number])  Patient Instructions: 1)  Please schedule a follow-up appointment in 3 .5months. 2)  Tobacco is very bad for your health and your loved ones! You Should stop smoking!. 3)  Stop Smoking Tips: Choose a Quit date. Cut down before the Quit date. decide what you will do as a substitute when you feel the urge to smoke(gum,toothpick,exercise). 4)  Hepatic Panel prior to visit, ICD-9: 5)  Lipid Panel prior to visit, ICD-9: 6)  H pylori 7)  vit D level  dyspepsia  fasting today. 8)  chem 7 9)  We will give you samples of meds for heartburn and also send in  omeprazole 10)  You will be referred to the GI doc for upper endo and also for a scan opf your abd Prescriptions: PREVACID 30 MG CPDR (LANSOPRAZOLE) Take 1 tablet by mouth once a day  #20 x 0   Entered by:   Everitt Amber   Authorized by:   [name]   Signed by:   [name] on 04/07/2009   Method used:   Samples Given   RxID:   [id number] HYDROCODONE-ACETAMINOPHEN 10-500 MG TABS (HYDROCODONE-ACETAMINOPHEN) one tab by mouth qid prn  #120 x 3   Entered by:   Everitt Amber   Authorized by:   [name]   Signed by:   Everitt Amber on 04/07/2009   Method used:   Printed then faxed to ...       [facility]* (retail)       [address]       [place], [place]  [address]       Ph: [phone]       Fax: [phone]   RxID:   [id number] AMLODIPINE BESYLATE 5 MG  TABS  (AMLODIPINE BESYLATE) Take 1 tab by mouth at bedtime  #90 x 3   Entered by:   Everitt Amber   Authorized by:   [name]   Signed by:   Everitt Amber on 04/07/2009   Method used:   Printed then faxed to ...       [facility] (retail)       [address]       [place], [place]  [address]       Ph: [phone]       Fax: [phone]   RxID:   [id number] OMEPRAZOLE 40 MG CPDR (OMEPRAZOLE) Take 1 capsule by mouth once a day  #30 x 3   Entered and Authorized by:   [name]   Signed by:   [name] on 04/07/2009   Method used:   Electronically to        [facility] (retail)       [address]       [place], [place]  [address]       Ph: [phone]       Fax: [phone]   RxID:   [id number]

## 2010-04-24 NOTE — Letter (Signed)
Summary: DEMO  DEMO   Imported By: Lind Guest 09/01/2009 08:50:44  _____________________________________________________________________  External Attachment:    Type:   Image     Comment:   External Document

## 2010-04-24 NOTE — Letter (Signed)
Summary: MISC  MISC   Imported By: Lind Guest 09/01/2009 08:56:54  _____________________________________________________________________  External Attachment:    Type:   Image     Comment:   External Document

## 2010-04-24 NOTE — Progress Notes (Signed)
Summary: tooth ache  Phone Note Call from Patient   Summary of Call: this pt needs the antibotic for her tooth ache. Sent over on wrong pt. [phone] [facility]  Initial call taken by: Rudene Anda,  Aug 14, 2009 2:36 PM  Follow-up for Phone Call        rx sent per dr simpson Follow-up by: Adella Hare LPN,  Aug 14, 2009 2:41 PM    New/Updated Medications: KEFLEX 500 MG CAPS (CEPHALEXIN) one cap by mouth once daily two times a day Prescriptions: KEFLEX 500 MG CAPS (CEPHALEXIN) one cap by mouth once daily two times a day  #20 x 0   Entered by:   Adella Hare LPN   Authorized by:   [name]   Signed by:   Adella Hare LPN on 45/40/9811   Method used:   Electronically to        [facility] (retail)       [address] [place], [place]  [address]       Ph: [phone]       Fax: [phone]   RxID:   [id number]

## 2010-04-24 NOTE — Progress Notes (Signed)
Summary: lab  Phone Note Call from Patient   Summary of Call: pt needs a lab order to have a creatine done for her CT scan on 04/13/09 9.00  Initial call taken by: Rudene Anda,  April 10, 2009 2:39 PM  Follow-up for Phone Call        lab ordered, called patient, left message Follow-up by: Worthy Keeler LPN,  April 10, 2009 3:22 PM  Additional Follow-up for Phone Call Additional follow up Details #1::        called patient, busy signal  called patient, busy signal Worthy Keeler LPN  April 11, 2009 1:14 PM  Additional Follow-up by: Worthy Keeler LPN,  April 11, 2009 11:04 AM    Additional Follow-up for Phone Call Additional follow up Details #2::    patient aware and is going to complete bloodwork, but she did have to cancel the ct scan and says she will call back when ready to reschedule Follow-up by: Worthy Keeler LPN,  April 12, 2009 9:04 AM

## 2010-04-24 NOTE — Assessment & Plan Note (Signed)
Summary: past due for f/u per pt phone call/tg   Referring Provider:  . Primary Provider:  [name]  CC:  fatigue and leg pain.  History of Present Illness: [name] returns to the office for continued assessment and treatment of coronary disease, peripheral vascular disease and multiple cardiovascular risk factors.  Since her last visit, she has been generally well.  She was seen in the emergency department on 2 occasions, once for obstipation and once for numerous tick bites.  She complains of generalized fatigue and poor exercise tolerance.  She notes no sleep disturbance.    She does have substantial life stress including the recent death of a sister, a second sister who is ill and requires her care and a nonworking husband.  She admits to depression and decreased appetite with weight loss.  She is strongly opposed to use of antidepressants.  She also notes easy bruising.  She has dyspnea on mild exertion.  She has intermittent chest discomfort that she attributes to indigestion.  She notes no orthopnea, PND nor peripheral edema.  Preventive Screening-Counseling & Management  Alcohol-Tobacco     Smoking Status: current     Smoking Cessation Counseling: yes     Smoke Cessation Stage: contemplative     Packs/Day: 0.25     Year Started: 1962     Pack years: 50-60  Current Medications (verified): 1)  Xanax 0.5 Mg  Tabs (Alprazolam) .... Take 1 Tablet By Mouth Four Times A Day 2)  Aspirin 81 Mg  Tbec (Aspirin) .... Take 1 Tablet By Mouth Once A Day 3)  Amlodipine Besylate 5 Mg  Tabs (Amlodipine Besylate) .... Take 1 Tab By Mouth At Bedtime 4)  Hydrocodone-Acetaminophen 10-500 Mg Tabs (Hydrocodone-Acetaminophen) .... One Tab By Mouth Qid Prn 5)  Crestor 40 Mg Tabs (Rosuvastatin Calcium) .Marland Kitchen.. 1 Once Daily 6)  Omeprazole 40 Mg Cpdr (Omeprazole) .... Take 1 Capsule By Mouth Once A Day  Allergies (verified): No Known Drug Allergies  Past History:  PMH, FH, and  Social History reviewed and updated.  Past Medical History: ASCVD: No critical dz on cath in 8/97; mild AI with trivial, if anyl AS; negative stress nuclear in 2010 Peripheral vascular disease-status post aortobifemoral graft Tobacco abuse/chronic bronchitis: Continuing at 1/2 pack per day in 2010 Hypertension HYPERLIPIDEMIA (ICD-272.4) Fasting hyperglycemia; mild elevation in hemoglobin A1c Hydronephrosis Gastroesophageal reflux disease BACK PAIN, CHRONIC (ICD-724.5) ANXIETY (ICD-300.00) DEPRESSION (ICD-311) Osteoporosis  EKG  Procedure date:  09/18/2009  Findings:      Rhythm Strip with 6 minute walk  Normal sinus rhythm present at rest and at a heart rate of 86. Oxygen saturation 96% at rest. Patient covered 1000 feet with fatigue but no dyspnea or chest discomfort. Heart rate increased only to 92 beats per minute. Oxygen saturation post exercise was 94%.  Good exercise tolerance without tachycardia or hypoxemia.   Social History: Packs/Day:  0.25 Pack years:  50-60  Review of Systems       See history of present illness.  Vital Signs:  Patient profile:   65 year old female Menstrual status:  hysterectomy Height:      62 inches Weight:      129 pounds BMI:     23.68 Pulse rate:   97 / minute Resp:     18 per minute BP sitting:   134 / 73  (left arm)  Vitals Entered By: Marrion Coy, CNA (September 18, 2009 12:55 PM)  Physical Exam  General:  Proportionate  weight and height; well developed; no acute distress:   Neck-No JVD; no carotid bruits: Lungs-No tachypnea, no rales; no rhonchi; no wheezes; decreased breath sounds in the bases; some prolongation of the expiratory phase. Cardiovascular-normal PMI; normal S1 and S2; grade 2/6 basilar systolic ejection murmur; diastolic murmur not appreciated. Abdomen-BS normal; soft and non-tender without masses or organomegaly:  Musculoskeletal-No deformities, no cyanosis or clubbing: Neurologic-Normal cranial nerves;  symmetric strength and tone:  Skin-Warm, no significant lesions: Extremities-Nl distal pulses except for mildly decreased right posterior tibial; no edema:     Impression & Recommendations:  Problem # 1:  PERIPHERAL VASCULAR DISEASE (ICD-443.9) Patient has done very well with respect to PVD since surgery more than a decade ago.  Issues with her legs are most likely related to physical deconditioning, and certainly are not caused by impaired circulation.  Problem # 2:  TOBACCO ABUSE (ICD-305.1) Patient admits to 1/4 packs per day.  We discussed the advisability of completely discontinuing tobacco use, but this has been amply addressed in the past without much improvement.  Problem # 3:  HYPERLIPIDEMIA (ICD-272.4) Lipid control is good with current medication, which will be continued.  Recent profile revealed total cholesterol of 156, HDL 40, LDL 65 and triglycerides of 257.  There is no data to verify that additional treatment to lower triglycerides would be to Ms. Brownstein' benefit.     Problem # 4:  HYPERTENSION (ICD-401.9) Blood pressure control is excellent; current medications will be continued.  Due to patient's nonspecific complaints of fatigue, a CBC and TSH level will be obtained.  I believe that her symptoms are likely related to depression and deconditioning.  Increased exercise is recommended.  She will be seeing you next month for further discussion of depression.  I will see this nice woman again in 8 months.  Patient Instructions: 1)  Your physician recommends that you schedule a follow-up appointment in: 8 months 2)  Your physician discussed the importance of regular exercise and recommended that you start or continue a regular exercise program for good health. walk like at the office 3 times a day

## 2010-04-24 NOTE — Assessment & Plan Note (Signed)
Summary: F UP   Vital Signs:  Patient profile:   65 year old female Menstrual status:  hysterectomy Height:      62 inches Weight:      132.75 pounds BMI:     24.37 O2 Sat:      98 % on Room air Pulse rate:   82 / minute Pulse rhythm:   regular BP sitting:   130 / 70  (right arm)  O2 Flow:  Room air CC: Carla [name] is here today for knee, arm and back aching.  Carla [name] states Carla [name] can't stand up to wash dishes without getting tired. Is Patient Diabetic? No Pain Assessment Patient in pain? no        Primary Care Provider:  [name]  CC:  Carla [name] is here today for knee and arm and back aching.  Carla [name] states Carla [name] can't stand up to wash dishes without getting tired.Marland Kitchen  History of Present Illness: Pt in c/o increased  arthritic pain involving back , knees, wrist. There is no recent trauma, states it is just associated with aging. Carla [name] expresses concern about inc forgefullnes also, and still smokes with no reduction in nicotine use. Carla [name] denies uncontrolled depressionor anxiety. Labsare reviewed , her cholesterol is better, but TG high, states Carla [name] has started eating daily apple pieand ice-cream, I advised her to d/c  Preventive Screening-Counseling & Management  Alcohol-Tobacco     Smoking Cessation Counseling: yes  Current Medications (verified): 1)  Xanax 0.5 Mg  Tabs (Alprazolam) .... Take 1 Tablet By Mouth Four Times A Day 2)  Aspirin 81 Mg  Tbec (Aspirin) .... Take 1 Tablet By Mouth Once A Day 3)  Amlodipine Besylate 5 Mg  Tabs (Amlodipine Besylate) .... Take 1 Tab By Mouth At Bedtime 4)  Hydrocodone-Acetaminophen 10-500 Mg Tabs (Hydrocodone-Acetaminophen) .... One Tab By Mouth Qid Prn 5)  Crestor 40 Mg Tabs (Rosuvastatin Calcium) .Marland Kitchen.. 1 Once Daily 6)  Vitamin D (Ergocalciferol) 50000 Unit Caps (Ergocalciferol) .... One Cap By Mouth Q Week 7)  Omega 3-6-9 1000 Mg 8)  Omeprazole 40 Mg Cpdr (Omeprazole) .... Take 1 Capsule By Mouth Once A Day 9)  Prevacid 30 Mg Cpdr (Lansoprazole)  .... Take 1 Tablet By Mouth Once A Day  Allergies (verified): No Known Drug Allergies  Review of Systems      See HPI General:  Complains of fatigue; denies chills and fever. Eyes:  Denies blurring and discharge. ENT:  Denies nasal congestion, sinus pressure, and sore throat. CV:  Denies chest pain or discomfort, palpitations, shortness of breath with exertion, and swelling of feet. Resp:  Complains of cough and shortness of breath; denies sputum productive and wheezing. GI:  Denies abdominal pain, constipation, diarrhea, nausea, and vomiting. GU:  Denies dysuria, incontinence, urinary frequency, and urinary hesitancy. MS:  Complains of joint pain, low back pain, mid back pain, muscle weakness, and stiffness; states her back is giving out, denies pain, but states Carla [name] has tosit even when washing dishes, after 15 mins has to sit, knees hurt alot, right more than left, also right wrist started hurting last week. Derm:  Denies itching and rash. Neuro:  Denies headaches, seizures, and sensation of room spinning. Psych:  Complains of anxiety and depression; denies mental problems, panic attacks, suicidal thoughts/plans, thoughts of violence, and unusual visions or sounds. Endo:  diid not f/u with endo regarding high pTYH, will rept this lab an contacty her, Carla [name] ias encouraged to return. Heme:  Denies abnormal bruising and bleeding. Allergy:  Complains of seasonal allergies.  Physical Exam  General:  Well-developed,well-nourished,in no acute distress; alert,appropriate and cooperative throughout examination. increased anxiety HEENT: No facial asymmetry,  EOMI, No sinus tenderness, TM's Clear, oropharynx  pink and moist. erythema and edma of nasal mucosa  Chest: Clear to auscultation bilaterally. decreased air entry bilaterally CVS: S1, S2, No murmurs, No S3.   Abd: Soft, Nontender. epigastric mass, and possible ventral hernia MS: decreased ROM spine,adequate in  hips, shoulders and knees.    Ext: No edema.   CNS: CN 2-12 intact, power tone and sensation normal throughout.   Skin: Intact, no visible lesions or rashes.  Psych: Good eye contact, normal affect.  Memory intact, not anxious or depressed appearing.     Impression & Recommendations:  Problem # 1:  DYSPEPSIA (ICD-536.8) Assessment Improved continue omeprazole  Problem # 2:  VITAMIN D DEFICIENCY (ICD-268.9) Assessment: Comment Only d/c vit D  , and take with calcium, Jan value was nl  Problem # 3:  PRIMARY HYPERPARATHYROIDISM (ICD-252.01) Assessment: Comment Only  Orders: T-Parathyroid Hormone, Intact ([id number]), pt encouraged to f/u with endo as was proposed at firstvisit last Oct  Problem # 4:  BACK PAIN, CHRONIC (ICD-724.5) Assessment: Deteriorated  Her updated medication list for this problem includes:    Aspirin 81 Mg Tbec (Aspirin) .Marland Kitchen... Take 1 tablet by mouth once a day    Hydrocodone-acetaminophen 10-500 Mg Tabs (Hydrocodone-acetaminophen) ..... One tab by mouth qid prn  Problem # 5:  HYPERTENSION (ICD-401.9) Assessment: Unchanged  Her updated medication list for this problem includes:    Amlodipine Besylate 5 Mg Tabs (Amlodipine besylate) .Marland Kitchen... Take 1 tab by mouth at bedtime  BP today: 130/70 Prior BP: 130/74 (04/07/2009)  Labs Reviewed: K+: 4.0 (04/12/2009) Creat: : 0.79 (04/12/2009)   Chol: 156 (04/12/2009)   HDL: 40 (04/12/2009)   LDL: 65 (04/12/2009)   TG: 257 (04/12/2009)  Problem # 6:  ANXIETY (ICD-300.00) Assessment: Deteriorated  The following medications were removed from the medication list:    Alprazolam 0.5 Mg Tabs (Alprazolam) .Marland Kitchen... 1 tab by mouth 4 times daily as needed for anxiety Her updated medication list for this problem includes:    Xanax 0.5 Mg Tabs (Alprazolam) .Marland Kitchen... Take 1 tablet by mouth four times a day  Problem # 7:  OTHER OSTEOPOROSIS (ICD-733.09) Assessment: Deteriorated  The following medications were removed from the medication list:    Fosamax 70  Mg Tabs (Alendronate sodium) ..... One tab by mouth every week Her updated medication list for this problem includes:    Alendronate Sodium 70 Mg Tabs (Alendronate sodium) ..... One tablet once weekly, take on an empty stomach with water , and remain upright for 30 minutes after takingpt had not been taking this dx confirmed since 2010  Problem # 8:  NICOTINE ADDICTION (ICD-305.1) Assessment: Unchanged  Encouraged smoking cessation and discussed different methods for smoking cessation.   Complete Medication List: 1)  Xanax 0.5 Mg Tabs (Alprazolam) .... Take 1 tablet by mouth four times a day 2)  Aspirin 81 Mg Tbec (Aspirin) .... Take 1 tablet by mouth once a day 3)  Amlodipine Besylate 5 Mg Tabs (Amlodipine besylate) .... Take 1 tab by mouth at bedtime 4)  Hydrocodone-acetaminophen 10-500 Mg Tabs (Hydrocodone-acetaminophen) .... One tab by mouth qid prn 5)  Crestor 40 Mg Tabs (Rosuvastatin calcium) .Marland Kitchen.. 1 once daily 6)  Omega 3-6-9 1000 Mg  7)  Omeprazole 40 Mg Cpdr (Omeprazole) .... Take 1 capsule by mouth once a day 8)  Alendronate  Sodium 70 Mg Tabs (Alendronate sodium) .... One tablet once weekly, take on an empty stomach with water , and remain upright for 30 minutes after taking  Other Orders: T- Hemoglobin A1C ([id number]) T-Basic Metabolic Panel ([id number]) T-Hepatic Function [phone]) T-Lipid Profile ([id number])  Patient Instructions: 1)  Please schedule a follow-up appointment in 3 months. 2)  Tobacco is very bad for your health and your loved ones! You Should stop smoking!. 3)  Stop Smoking Tips: Choose a Quit date. Cut down before the Quit date. decide what you will do as a substitute when you feel the urge to smoke(gum,toothpick,exercise). 4)  Hepatic Panel prior to visit, ICD-9:  fasting in 3 months 5)  Lipid Panel prior to visit, ICD-9: 6)  chem7, vit D level 7)  You can stop the vit Dprescription 8)  Start calcium with D gel capsule, 12000mg  calcium with  1000IU vit D one every day  9)  HBA1C, PTH level Prescriptions: ALENDRONATE SODIUM 70 MG TABS (ALENDRONATE SODIUM) one tablet once weekly, take on an empty stomach with water , and remain upright for 30 minutes after taking  #4 x 6   Entered and Authorized by:   [name]   Signed by:   [name] on 07/10/2009   Method used:   Electronically to        [facility]* (retail)       [address]       [place], [place]  [address]       Ph: [phone]       Fax: [phone]   RxID:   [id number]

## 2010-04-24 NOTE — Progress Notes (Signed)
Summary: COUGH  Phone Note Call from Patient   Summary of Call: GAVE HER SOME COUGH MEDICINE LAST YEAR AND SHE IS COUGHING AGAIN  AND WANTS TO KNOW WILL YOU CALL SOME FOR HER AT  [facility] CALL BACK AT  [phone] AND LEAVE A MESSAGE ON THE MACHINE Initial call taken by: Lind Guest,  February 06, 2010 1:04 PM  Follow-up for Phone Call         to pharmacy/call in script entered pls send iin apotheacary syrup Follow-up by: [name],  February 08, 2010 9:31 AM  Additional Follow-up for Phone Call Additional follow up Details #1::        med sent Additional Follow-up by: Adella Hare LPN,  February 08, 2010 10:40 AM    New/Updated Medications: * APOTHECARY COUGH SYRUP one teaspoon  3 times daily, as needed Prescriptions: APOTHECARY COUGH SYRUP one teaspoon  3 times daily, as needed  #8 ounces x 1   Entered and Authorized by:   [name]   Signed by:   [name] on 02/08/2010   Method used:   Printed then faxed to ...       [facility] (retail)       [address]       [place], [place]  [address]       Ph: [phone]       Fax: [phone]   RxID:   [id number]

## 2010-04-24 NOTE — Miscellaneous (Signed)
Summary: cbcd,bmp,lipid,liver,12/28/2008  Clinical Lists Changes  Observations: Added new observation of ABSOLUTE BAS: 0.1 K/uL (12/28/2008 16:36) Added new observation of BASOPHIL %: 1 % (12/28/2008 16:36) Added new observation of EOS ABSLT: 0.1 K/uL (12/28/2008 16:36) Added new observation of ABSOLUTE MON: 1 K/uL (12/28/2008 16:36) Added new observation of MONOCYTE %: 7 % (12/28/2008 16:36) Added new observation of ABS LYMPHOCY: 4.4 K/uL (12/28/2008 16:36) Added new observation of LYMPHS %: 43 % (12/28/2008 16:36) Added new observation of ALBUMIN: 4.7 g/dL (01/19/2535 64:40) Added new observation of PROTEIN, TOT: 7.8 g/dL (34/74/2595 63:87) Added new observation of SGPT (ALT): 18 units/L (12/28/2008 16:36) Added new observation of SGOT (AST): 19 units/L (12/28/2008 16:36) Added new observation of ALK PHOS: 133 units/L (12/28/2008 16:36) Added new observation of BILI DIRECT: 0.1 mg/dL (56/43/3295 18:84) Added new observation of GFR: 11.2 mL/min (12/28/2008 16:36) Added new observation of CREATININE: 0.77 mg/dL (16/60/6301 60:10) Added new observation of BUN: 15 mg/dL (93/23/5573 22:02) Added new observation of BG RANDOM: 94 mg/dL (54/27/0623 76:28) Added new observation of CO2 PLSM/SER: 24 meq/L (12/28/2008 16:36) Added new observation of CL SERUM: 104 meq/L (12/28/2008 16:36) Added new observation of K SERUM: 5.2 meq/L (12/28/2008 16:36) Added new observation of NA: 142 meq/L (12/28/2008 16:36) Added new observation of LDL: 146 mg/dL (31/51/7616 07:37) Added new observation of HDL: 44 mg/dL (10/62/6948 54:62) Added new observation of TRIGLYC TOT: 290 mg/dL (70/35/0093 81:82) Added new observation of CHOLESTEROL: 248 mg/dL (99/37/1696 78:93) Added new observation of PLATELETK/UL: 283 K/uL (12/28/2008 16:36) Added new observation of MCV: 95.4 fL (12/28/2008 16:36) Added new observation of HCT: 52.0 % (12/28/2008 16:36) Added new observation of HGB: 17.2 g/dL (81/03/7508 25:85) Added  new observation of WBC COUNT: 10.1 10*3/microliter (12/28/2008 16:36)

## 2010-04-24 NOTE — Assessment & Plan Note (Signed)
Summary: office visit   Vital Signs:  Patient profile:   65 year old female Menstrual status:  hysterectomy Height:      62 inches Weight:      129.25 pounds BMI:     23.73 O2 Sat:      96 % Pulse rate:   74 / minute Pulse rhythm:   regular Resp:     16 per minute BP sitting:   138 / 74  (left arm) Cuff size:   regular  Vitals Entered By: Everitt Amber LPN (October 10, 2009 11:09 AM) CC: felt good when she got up but now she feels nauseaous and was a little lightheaded earlier. She didn't eat this morning and thinks that may be it    Primary Care Provider:  [name]  CC:  felt good when she got up but now she feels nauseaous and was a little lightheaded earlier. She didn't eat this morning and thinks that may be it .  History of Present Illness: Reports  that she hjas been fairly well. Denies recent fever or chills. Denies sinus pressure, nasal congestion , ear pain or sore throat. Denies chest congestion, or cough productive of sputum.She continues to have a chronic smokers cough, and is experiencing increased dyspnea Denies chest pain, palpitations, PND, orthopnea or leg swelling. Denies abdominal pain, vomitting or  diarrhea  Denies change in bowel movements or bloody stool. Denies dysuria , frequency, incontinence or hesitancy. . Denies headaches, vertigo, seizures.  Denies  rash, lesions, or itch. she states her nicotine use is unchanged, and she has no quit date in mind     Preventive Screening-Counseling & Management  Alcohol-Tobacco     Smoking Cessation Counseling: yes  Allergies: No Known Drug Allergies  Review of Systems      See HPI General:  Complains of fatigue and sleep disorder; denies chills and fever. Eyes:  Denies blurring and discharge. GI:  Complains of constipation and nausea; denies abdominal pain, diarrhea, indigestion, and vomiting. MS:  Complains of low back pain and mid back pain. Psych:  Complains of anxiety and depression;  denies irritability, mental problems, suicidal thoughts/plans, thoughts of violence, and unusual visions or sounds. Endo:  Denies excessive thirst, excessive urination, and heat intolerance. Heme:  Denies abnormal bruising and bleeding. Allergy:  Denies hives or rash and itching eyes.  Physical Exam  General:  Well-developed,well-nourished,in no acute distress; alert,appropriate and cooperative throughout examination. HEENT: No facial asymmetry,  EOMI, No sinus tenderness, TM's Clear, oropharynx  pink and moist. erythema and edma of nasal mucosa  Chest: Clear to auscultation bilaterally. decreased air entry bilaterally CVS: S1, S2, No murmurs, No S3.   Abd: Soft, Nontender. positive bowel sounds MS: decreased ROM spine,adequate in  hips, shoulders and knees.  Ext: No edema.   CNS: CN 2-12 intact, power tone and sensation normal throughout.   Skin: Intact, no visible lesions or rashes.  Psych: Good eye contact, normal affect.  Memory intact, not anxious or depressed appearing.     Impression & Recommendations:  Problem # 1:  IMPAIRED FASTING GLUCOSE (ICD-790.21) Assessment Comment Only  Orders: T- Hemoglobin A1C ([id number]), pt encouraged to reduce carb and sweet intake  Problem # 2:  COUGH, CHRONIC (ICD-786.2) Assessment: Unchanged  Orders: CXR- 2view (CXR) nicotine cessation counselling done  Problem # 3:  TOBACCO ABUSE (ICD-305.1) Assessment: Unchanged  Encouraged smoking cessation and discussed different methods for smoking cessation.   Problem # 4:  HYPERTENSION (ICD-401.9) Assessment: Unchanged  Her  updated medication list for this problem includes:    Amlodipine Besylate 5 Mg Tabs (Amlodipine besylate) .Marland Kitchen... Take 1 tab by mouth at bedtime  BP today: 138/74 Prior BP: 134/73 (09/18/2009)  Labs Reviewed: K+: 4.0 (04/12/2009) Creat: : 0.79 (04/12/2009)   Chol: 156 (04/12/2009)   HDL: 40 (04/12/2009)   LDL: 65 (04/12/2009)   TG: 257 (04/12/2009)  Problem # 5:   HYPERLIPIDEMIA (ICD-272.4) Assessment: Comment Only  Her updated medication list for this problem includes:    Crestor 40 Mg Tabs (Rosuvastatin calcium) .Marland Kitchen... 1 once daily  Labs Reviewed: SGOT: 21 (04/12/2009)   SGPT: 19 (04/12/2009)   HDL:40 (04/12/2009), 40 (04/12/2009)  LDL:65 (04/12/2009), 65 (04/12/2009)  Chol:156 (04/12/2009), 156 (04/12/2009)  Trig:257 (04/12/2009), 257 (04/12/2009), rept labs due  Problem # 6:  BACK PAIN, CHRONIC (ICD-724.5) Assessment: Unchanged  Her updated medication list for this problem includes:    Aspirin 81 Mg Tbec (Aspirin) .Marland Kitchen... Take 1 tablet by mouth once a day    Hydrocodone-acetaminophen 10-500 Mg Tabs (Hydrocodone-acetaminophen) ..... One tab by mouth qid prn  Problem # 7:  ANXIETY (ICD-300.00) Assessment: Unchanged  Her updated medication list for this problem includes:    Xanax 0.5 Mg Tabs (Alprazolam) .Marland Kitchen... Take 1 tablet by mouth four times a day  Discussed medication use and relaxation techniques.   Problem # 8:  DEPRESSION (ICD-311) Assessment: Unchanged  Her updated medication list for this problem includes:    Xanax 0.5 Mg Tabs (Alprazolam) .Marland Kitchen... Take 1 tablet by mouth four times a day  Discussed treatment options, including trial of antidpressant medication. Will refer to behavioral health. Follow-up call in in 24-48 hours and recheck in 2 weeks, sooner as needed. Patient agrees to call if any worsening of symptoms or thoughts of doing harm arise. Verified that the patient has no suicidal ideation at this time.   Complete Medication List: 1)  Xanax 0.5 Mg Tabs (Alprazolam) .... Take 1 tablet by mouth four times a day 2)  Aspirin 81 Mg Tbec (Aspirin) .... Take 1 tablet by mouth once a day 3)  Amlodipine Besylate 5 Mg Tabs (Amlodipine besylate) .... Take 1 tab by mouth at bedtime 4)  Hydrocodone-acetaminophen 10-500 Mg Tabs (Hydrocodone-acetaminophen) .... One tab by mouth qid prn 5)  Crestor 40 Mg Tabs (Rosuvastatin calcium) .Marland Kitchen.. 1  once daily 6)  Omeprazole 40 Mg Cpdr (Omeprazole) .... Take 1 capsule by mouth once a day  Other Orders: Glucose, (CBG) ([address]109) Radiology Referral (Radiology)  Patient Instructions: 1)  Please schedule a follow-up appointment in 3 months. 2)  Tobacco is very bad for your health and your loved ones! You Should stop smoking!. 3)  Stop Smoking Tips: Choose a Quit date. Cut down before the Quit date. decide what you will do as a substitute when you feel the urge to smoke(gum,toothpick,exercise). 4)  HbgA1C prior to visit, ICD-9: 5)  cXr today if possible 6)  [place] will be scheduled  we will call you 7)  Start colace every day, one to four, this will help the constipation  Laboratory Results   Blood Tests     Glucose (fasting): 93 mg/dL   (Normal Range: 60-454)

## 2010-04-24 NOTE — Assessment & Plan Note (Signed)
Summary: F UP   Vital Signs:  Patient profile:   65 year old female Menstrual status:  hysterectomy Height:      62 inches Weight:      128.50 pounds BMI:     23.59 O2 Sat:      96 % on Room air Pulse rate:   91 / minute Pulse rhythm:   regular Resp:     16 per minute BP sitting:   122 / 68  (left arm)  Vitals Entered By: Mauricia Area CMA (January 17, 2010 11:32 AM)  O2 Flow:  Room air CC: Follow up. Sore throat as of yesterday.   Primary Care Provider:  [name]  CC:  Follow up. Sore throat as of yesterday.Marland Kitchen  History of Present Illness: Reports  that they are doing well. Denies recent fever or chills. Denies sinus pressure, nasal congestion , ear pain has a 2 day h/o sore throat. Denies chest congestion, or cough productive of sputum. Denies chest pain, palpitations, PND, orthopnea or leg swelling. Denies abdominal pain, nausea, vomitting, diarrhea or constipation. Denies change in bowel movements or bloody stool. Denies dysuria , frequency, incontinence or hesitancy. c/o increased back pain. Denies headaches, vertigo, seizures. Denies depression, anxiety or insomnia. Denies  rash, lesions, or itch.     Preventive Screening-Counseling & Management  Alcohol-Tobacco     Smoking Cessation Counseling: yes  Current Medications (verified): 1)  Xanax 0.5 Mg  Tabs (Alprazolam) .... Take 1 Tablet By Mouth Four Times A Day 2)  Aspirin 81 Mg  Tbec (Aspirin) .... Take 1 Tablet By Mouth Once A Day 3)  Amlodipine Besylate 5 Mg  Tabs (Amlodipine Besylate) .... Take 1 Tab By Mouth At Bedtime 4)  Hydrocodone-Acetaminophen 10-500 Mg Tabs (Hydrocodone-Acetaminophen) .... One Tab By Mouth Four Times A Day As Needed. 5)  Crestor 40 Mg Tabs (Rosuvastatin Calcium) .Marland Kitchen.. 1 Once Daily 6)  Omeprazole 40 Mg Cpdr (Omeprazole) .... Take 1 Capsule By Mouth Once A Day  Allergies (verified): No Known Drug Allergies  Review of Systems      See HPI General:  Complains of  fatigue, malaise, and sleep disorder. Eyes:  Denies blurring and discharge. Resp:  Complains of cough, shortness of breath, and wheezing; denies sputum productive. MS:  Complains of joint pain, low back pain, mid back pain, and stiffness. Psych:  Complains of anxiety and depression; denies mental problems, suicidal thoughts/plans, thoughts of violence, and unusual visions or sounds. Endo:  Denies cold intolerance, excessive hunger, excessive thirst, and heat intolerance. Heme:  Denies abnormal bruising and bleeding. Allergy:  Complains of seasonal allergies.  Physical Exam  General:  Well-developed,well-nourished,in no acute distress; alert,appropriate and cooperative throughout examination HEENT: No facial asymmetry,  EOMI, No sinus tenderness, TM's Clear, oropharynx  pink and moist.   Chest: Clear to auscultation bilaterally.  CVS: S1, S2, No murmurs, No S3.   Abd: Soft, Nontender.  ZO:XWRUEAVW  ROM spine, hips, shoulders and knees.  Ext: No edema.   CNS: CN 2-12 intact, power tone and sensation normal throughout.   Skin: Intact, no visible lesions or rashes.  Psych: Good eye contact, normal affect.  Memory intact, not anxious or depressed appearing.    Impression & Recommendations:  Problem # 1:  COUGH, CHRONIC (ICD-786.2) Assessment Unchanged pt n eeds to quit smoiking  Problem # 2:  BACK PAIN, CHRONIC (ICD-724.5) Assessment: Deteriorated  The following medications were removed from the medication list:    Hydrocodone-acetaminophen 10-500 Mg Tabs (Hydrocodone-acetaminophen) ..... One  tab by mouth qid prn Her updated medication list for this problem includes:    Aspirin 81 Mg Tbec (Aspirin) .Marland Kitchen... Take 1 tablet by mouth once a day    Hydrocodone-acetaminophen 10-500 Mg Tabs (Hydrocodone-acetaminophen) .Marland Kitchen... Take 1 tablet by mouth four times a day  Discussed use of moist heat or ice, modified activities, medications, and stretching/strengthening exercises. Back care  instructions given. To be seen in 2 weeks if no improvement; sooner if worsening of symptoms.   Problem # 3:  TOBACCO ABUSE (ICD-305.1) Assessment: Unchanged  Encouraged smoking cessation and discussed different methods for smoking cessation.   Problem # 4:  HYPERTENSION (ICD-401.9) Assessment: Improved  Her updated medication list for this problem includes:    Amlodipine Besylate 5 Mg Tabs (Amlodipine besylate) .Marland Kitchen... Take 1 tab by mouth at bedtime  Orders: T-Basic Metabolic Panel [phone])  BP today: 122/68 Prior BP: 138/74 (10/10/2009)  Labs Reviewed: K+: 4.3 (10/10/2009) Creat: : 0.83 (10/10/2009)   Chol: 317 (10/10/2009)   HDL: 41 (10/10/2009)   LDL: See Comment mg/dL (09/81/1914)   TG: 782 (10/10/2009)  Problem # 5:  HYPERLIPIDEMIA (ICD-272.4) Assessment: Comment Only  Her updated medication list for this problem includes:    Crestor 40 Mg Tabs (Rosuvastatin calcium) .Marland Kitchen... 1 once daily  Orders: T-Hepatic Function [id number]) T-Lipid Profile [phone])  Labs Reviewed: SGOT: 17 (10/10/2009)   SGPT: 12 (10/10/2009)   HDL:41 (10/10/2009), 40 (04/12/2009)  LDL:See Comment mg/dL (84/13/2440), 65 (01/19/2535)  Chol:317 (10/10/2009), 156 (04/12/2009)  Trig:441 (10/10/2009), 257 (04/12/2009)  Complete Medication List: 1)  Xanax 0.5 Mg Tabs (Alprazolam) .... Take 1 tablet by mouth four times a day 2)  Aspirin 81 Mg Tbec (Aspirin) .... Take 1 tablet by mouth once a day 3)  Amlodipine Besylate 5 Mg Tabs (Amlodipine besylate) .... Take 1 tab by mouth at bedtime 4)  Crestor 40 Mg Tabs (Rosuvastatin calcium) .Marland Kitchen.. 1 once daily 5)  Omeprazole 40 Mg Cpdr (Omeprazole) .... Take 1 capsule by mouth once a day 6)  Hydrocodone-acetaminophen 10-500 Mg Tabs (Hydrocodone-acetaminophen) .... Take 1 tablet by mouth four times a day  Other Orders: T- Hemoglobin A1C ([id number]) T-CBC w/Diff ([id number]) T-TSH ([id number]) Influenza Vaccine NON MCR (60630)  Patient  Instructions: 1)  Please schedule a follow-up appointment in 4 months. 2)  Tobacco is very bad for your health and your loved ones! You Should stop smoking!. 3)  Stop Smoking Tips: Choose a Quit date. Cut down before the Quit date. decide what you will do as a substitute when you feel the urge to smoke(gum,toothpick,exercise). 4)  BMP prior to visit, ICD-9: 5)  Hepatic Panel prior to visit, ICD-9: 6)  Lipid Panel prior to visit, ICD-9:  fasting 2nd week in November 7)  HbgA1C prior to visit, ICD-9: 8)  No change in meds at this time. 9)  Flu vaxc today Prescriptions: HYDROCODONE-ACETAMINOPHEN 10-500 MG TABS (HYDROCODONE-ACETAMINOPHEN) Take 1 tablet by mouth four times a day  #120 x 4   Entered by:   Adella Hare LPN   Authorized by:   [name]   Signed by:   Adella Hare LPN on 16/03/930   Method used:   Printed then faxed to ...       [facility] (retail)       [address]       [place], [place]  [address]       Ph: [phone]       Fax: [phone]  RxID:   [id number] HYDROCODONE-ACETAMINOPHEN 10-500 MG TABS (HYDROCODONE-ACETAMINOPHEN) Take 1 tablet by mouth four times a day  #120 x 4   Entered and Authorized by:   [name]   Signed by:   [name] on 01/17/2010   Method used:   Printed then faxed to ...       [facility]* (retail)       [address]       [place], [place]  [address]       Ph: [phone]       Fax: [phone]   RxID:   [id number]    Orders Added: 1)  Est. Patient Level IV [[address]214] 2)  T-Basic Metabolic Panel [phone] 3)  T-Hepatic Function [80076-22960] 4)  T-Lipid Profile [80061-22930] 5)  T- Hemoglobin A1C [83036-23375] 6)  T-CBC w/Diff [95638-75643] 7)  T-TSH [32951-88416] 8)  Influenza Vaccine NON MCR [00028]   Immunizations Administered:  Influenza Vaccine # 1:    Vaccine Type: Fluvax Non-MCR    Site: left deltoid     Mfr: novartis    Dose: 0.5 ml    Route: IM    Given by: Adella Hare LPN    Exp. Date: 07/2010    Lot #: [id number]    VIS given: 10/17/09 version given January 17, 2010.   Immunizations Administered:  Influenza Vaccine # 1:    Vaccine Type: Fluvax Non-MCR    Site: left deltoid    Mfr: novartis    Dose: 0.5 ml    Route: IM    Given by: Adella Hare LPN    Exp. Date: 07/2010    Lot #: [id number]    VIS given: 10/17/09 version given January 17, 2010.

## 2010-04-24 NOTE — Letter (Signed)
Summary: LABS  LABS   Imported By: Lind Guest 09/01/2009 08:56:09  _____________________________________________________________________  External Attachment:    Type:   Image     Comment:   External Document

## 2010-04-24 NOTE — Miscellaneous (Signed)
Summary: labs bmp,lipids,liver,04/12/2009  Clinical Lists Changes  Observations: Added new observation of CALCIUM: 9.9 mg/dL (45/40/9811 91:47) Added new observation of ALBUMIN: 7.7 g/dL (82/95/6213 08:65) Added new observation of SGPT (ALT): 19 units/L (04/12/2009 16:32) Added new observation of SGOT (AST): 21 units/L (04/12/2009 16:32) Added new observation of ALK PHOS: 114 units/L (04/12/2009 16:32) Added new observation of BILI DIRECT: 0.1 mg/dL (78/46/9629 52:84) Added new observation of CREATININE: 0.79 mg/dL (13/24/4010 27:25) Added new observation of BUN: 10 mg/dL (36/64/4034 74:25) Added new observation of BG RANDOM: 107 mg/dL (95/63/8756 43:32) Added new observation of CO2 PLSM/SER: 25 meq/L (04/12/2009 16:32) Added new observation of CL SERUM: 106 meq/L (04/12/2009 16:32) Added new observation of K SERUM: 4.0 meq/L (04/12/2009 16:32) Added new observation of NA: 141 meq/L (04/12/2009 16:32) Added new observation of LDL: 65 mg/dL (95/18/8416 60:63) Added new observation of HDL: 40 mg/dL (01/60/1093 23:55) Added new observation of TRIGLYC TOT: 257 mg/dL (73/22/0254 27:06) Added new observation of CHOLESTEROL: 156 mg/dL (23/76/2831 51:76)

## 2010-04-24 NOTE — Letter (Signed)
Summary: X RAYS  X RAYS   Imported By: Lind Guest 09/01/2009 08:59:14  _____________________________________________________________________  External Attachment:    Type:   Image     Comment:   External Document

## 2010-05-21 ENCOUNTER — Ambulatory Visit: Payer: Medicare Other | Admitting: Family Medicine

## 2010-05-22 ENCOUNTER — Encounter: Payer: Self-pay | Admitting: Family Medicine

## 2010-05-22 ENCOUNTER — Encounter (INDEPENDENT_AMBULATORY_CARE_PROVIDER_SITE_OTHER): Payer: Self-pay | Admitting: *Deleted

## 2010-05-23 ENCOUNTER — Ambulatory Visit (INDEPENDENT_AMBULATORY_CARE_PROVIDER_SITE_OTHER): Payer: PRIVATE HEALTH INSURANCE | Admitting: Cardiology

## 2010-05-23 ENCOUNTER — Encounter: Payer: Self-pay | Admitting: Cardiology

## 2010-05-23 ENCOUNTER — Encounter (INDEPENDENT_AMBULATORY_CARE_PROVIDER_SITE_OTHER): Payer: Self-pay | Admitting: *Deleted

## 2010-05-23 DIAGNOSIS — E782 Mixed hyperlipidemia: Secondary | ICD-10-CM

## 2010-05-23 DIAGNOSIS — I10 Essential (primary) hypertension: Secondary | ICD-10-CM

## 2010-05-23 DIAGNOSIS — I2510 Atherosclerotic heart disease of native coronary artery without angina pectoris: Secondary | ICD-10-CM

## 2010-05-31 ENCOUNTER — Telehealth (INDEPENDENT_AMBULATORY_CARE_PROVIDER_SITE_OTHER): Payer: Self-pay | Admitting: *Deleted

## 2010-05-31 NOTE — Miscellaneous (Signed)
Summary: chest xray 10/13/2009  Clinical Lists Changes  Observations: Added new observation of CXR RESULTS:   Clinical Data: Smoker, chronic cough, tobacco abuse    CHEST - 2 VIEW    Comparison: 03/01/2005    Findings:   Normal heart size, mediastinal contours, and pulmonary vascularity.   Emphysematous changes with minimal peribronchial thickening   compatible with COPD.   Atherosclerotic calcification aortic arch.   No pulmonary infiltrate, pleural effusion, or pneumothorax.   Bones appear demineralized.    IMPRESSION:   COPD.   No acute abnormalities.    Read By:  Lollie Marrow,  M.D.   Released By:  Lollie Marrow,  M.D.  Additional Information (10/13/2009 8:59)      CXR  Procedure date:  10/13/2009  Findings:        Clinical Data: Smoker, chronic cough, tobacco abuse    CHEST - 2 VIEW    Comparison: 03/01/2005    Findings:   Normal heart size, mediastinal contours, and pulmonary vascularity.   Emphysematous changes with minimal peribronchial thickening   compatible with COPD.   Atherosclerotic calcification aortic arch.   No pulmonary infiltrate, pleural effusion, or pneumothorax.   Bones appear demineralized.    IMPRESSION:   COPD.   No acute abnormalities.    Read By:  Lollie Marrow,  M.D.   Released By:  Lollie Marrow,  M.D.  Additional Information

## 2010-05-31 NOTE — Letter (Signed)
Summary: [facility], [facility] at [employer]  [address] [place] [address]   Phone: [phone]  Fax: [phone]     May 23, 2010 MRN: [id number]   Carla [name] [address] [place], [place]  [address]      YOUR LAB WORK IS DUE   June 21, 2010  Please go to [facility], located across the street from [facility] on the second floor.  Hours are Monday - Friday 7am until 7:30pm         Saturday 8am until 12noon    _X_  DO NOT EAT OR DRINK AFTER MIDNIGHT EVENING PRIOR TO LABWORK

## 2010-05-31 NOTE — Miscellaneous (Signed)
Summary: labs cbcd,bmp,lipid,liver,03/13/2010  Clinical Lists Changes  Observations: Added new observation of CALCIUM: 10.6 mg/dL (16/12/9602 5:40) Added new observation of ALBUMIN: 4.7 g/dL (98/01/9146 8:29) Added new observation of PROTEIN, TOT: 7.4 g/dL (56/21/3086 5:78) Added new observation of SGPT (ALT): 12 units/L (03/13/2010 8:51) Added new observation of SGOT (AST): 21 units/L (03/13/2010 8:51) Added new observation of ALK PHOS: 127 units/L (03/13/2010 8:51) Added new observation of BILI DIRECT: 0.1 mg/dL (46/96/2952 8:41) Added new observation of CREATININE: 0.81 mg/dL (32/44/0102 7:25) Added new observation of BUN: 11 mg/dL (36/64/4034 7:42) Added new observation of BG RANDOM: 107 mg/dL (59/56/3875 6:43) Added new observation of CO2 PLSM/SER: 24 meq/L (03/13/2010 8:51) Added new observation of CL SERUM: 104 meq/L (03/13/2010 8:51) Added new observation of K SERUM: 4.1 meq/L (03/13/2010 8:51) Added new observation of NA: 140 meq/L (03/13/2010 8:51) Added new observation of LDL: 157 mg/dL (32/95/1884 1:66) Added new observation of HDL: 41 mg/dL (09/22/1599 0:93) Added new observation of TRIGLYC TOT: 235 mg/dL (23/55/7322 0:25) Added new observation of CHOLESTEROL: 245 mg/dL (42/70/6237 6:28) Added new observation of TSH: 2.370 microintl units/mL (03/13/2010 8:51) Added new observation of HGBA1C: 6.2 % (03/13/2010 8:51) Added new observation of ABSOLUTE BAS: 0.1 K/uL (03/13/2010 8:51) Added new observation of BASOPHIL %: 1 % (03/13/2010 8:51) Added new observation of EOS ABSLT: 0.1 K/uL (03/13/2010 8:51) Added new observation of % EOS AUTO: 1 % (03/13/2010 8:51) Added new observation of ABSOLUTE MON: 0.5 K/uL (03/13/2010 8:51) Added new observation of MONOCYTE %: 8 % (03/13/2010 8:51) Added new observation of ABS LYMPHOCY: 3.3 K/uL (03/13/2010 8:51) Added new observation of LYMPHS %: 46 % (03/13/2010 8:51) Added new observation of PLATELETK/UL: 273 K/uL (03/13/2010  8:51) Added new observation of RDW: 15.0 % (03/13/2010 8:51) Added new observation of MCHC RBC: 32.8 g/dL (31/51/7616 0:73) Added new observation of MCV: 95.0 fL (03/13/2010 8:51) Added new observation of HCT: 51.5 % (03/13/2010 8:51) Added new observation of HGB: 16.9 g/dL (71/08/2692 8:54) Added new observation of RBC M/UL: 5.42 M/uL (03/13/2010 8:51) Added new observation of WBC COUNT: 7.2 10*3/microliter (03/13/2010 8:51)

## 2010-05-31 NOTE — Letter (Signed)
Summary: 1st no show letter  1st no show letter   Imported By: Lind Guest 05/22/2010 08:24:06  _____________________________________________________________________  External Attachment:    Type:   Image     Comment:   External Document

## 2010-06-01 ENCOUNTER — Other Ambulatory Visit: Payer: Self-pay | Admitting: Cardiology

## 2010-06-01 LAB — CONVERTED CEMR LAB
Cholesterol: 153 mg/dL (ref 0–200)
HDL: 49 mg/dL (ref >39)

## 2010-06-01 LAB — LIPID PANEL
HDL: 49 mg/dL (ref >39)
LDL Cholesterol: 71 mg/dL (ref 0–99)
Triglycerides: 166 mg/dL — ABNORMAL HIGH (ref <150)
VLDL: 33 mg/dL (ref 0–40)

## 2010-06-05 ENCOUNTER — Telehealth: Payer: Self-pay | Admitting: *Deleted

## 2010-06-05 NOTE — Progress Notes (Signed)
Summary: cost of cholesterol med  Phone Note From Pharmacy   Caller: [facility] Call For: cost of welchol  Summary of Call: S: pt was place on welchol at ov on 05/23/2010 B: welchol co-pay for pt is $100 A: pt is asking for something cheaper, she is already on crestor 40mg  daily R:  Initial call taken by: Teressa Lower RN,  May 31, 2010 10:12 AM  Follow-up for Phone Call        Take her off welchol if she is on Crestor and taking it.  She can have zetia 10mg  daily  if it is not too expensive on co-pay. Follow-up by: Joni Reining NP     Appended Document:  zetia will be 51.29 co pay at [facility], left message for pt to call back.  Appended Document: cost of cholesterol med pt states that husband has been laid off and she doesn't work, Andrey Campanile will be send all information to Blima Singer at [facility] . to try to get med assistance for this pt.

## 2010-06-12 NOTE — Progress Notes (Signed)
Summary: pain medicine  Phone Note Call from Patient   Summary of Call: wants to know if going to send in pain medicine to pharm. [phone] Initial call taken by: Rudene Anda,  June 05, 2010 9:45 AM  Follow-up for Phone Call        Pt aware its sent in Follow-up by: Everitt Amber LPN,  June 05, 2010 9:49 AM    Prescriptions: HYDROCODONE-ACETAMINOPHEN 10-500 MG TABS (HYDROCODONE-ACETAMINOPHEN) Take 1 tablet by mouth four times a day  #120 x 1   Entered by:   Everitt Amber LPN   Authorized by:   [name]   Signed by:   Everitt Amber LPN on 11/91/4782   Method used:   Printed then faxed to ...       [facility] (retail)       [address]       [place], [place]  [address]       Ph: [phone]       Fax: [phone]   RxID:   [id number]

## 2010-06-12 NOTE — Assessment & Plan Note (Signed)
Summary: 8 MTH F/U PER CHECKOUT ON 09/18/09/TG, SCH PER TP CALL, TMJ/TR   Visit Type:  Follow-up Referring Provider:  . Primary Provider:  [name]   History of Present Illness: [name] returns to the office as scheduled for continued assessment and treatment of coronary artery disease and peripheral vascular disease.  Since her last visit, she has continued to do well from a cardiology standpoint.  She denies chest pain, has chronic class II dyspnea on exertion, but  notes no orthopnea, PND, claudication or peripheral edema.  She has had no new health problems and continues to report decreased cigarette consumption, most recently one quarter pack per day.  She has had right neck pain radiating to the right arm intermittently.  Lifestyle is sedentary, but she experiences no symptoms with her usual daily activities.  Current Medications (verified): 1)  Xanax 0.5 Mg  Tabs (Alprazolam) .... Take 1 Tablet By Mouth Four Times A Day 2)  Aspirin 81 Mg  Tbec (Aspirin) .... Take 1 Tablet By Mouth Once A Day 3)  Amlodipine Besylate 5 Mg  Tabs (Amlodipine Besylate) .... Take 1 Tab By Mouth At Bedtime 4)  Crestor 40 Mg Tabs (Rosuvastatin Calcium) .Marland Kitchen.. 1 Once Daily 5)  Hydrocodone-Acetaminophen 10-500 Mg Tabs (Hydrocodone-Acetaminophen) .... Take 1 Tablet By Mouth Four Times A Day 6)  Zetia 10 Mg Tabs (Ezetimibe) .... Take 1 Tablet By Mouth Once A Day  Allergies (verified): No Known Drug Allergies  Comments:  Nurse/Medical Assistant: no meds no list we reviewed meds from previous ov she uses Estate agent  Past History:  PMH, FH, and Social History reviewed and updated.    Review of Systems       See history of present illness.  Vital Signs:  Patient profile:   65 year old female Menstrual status:  hysterectomy Weight:      130 pounds BMI:     23.86 Pulse rate:   86 / minute BP sitting:   150 / 79  (left arm)  Vitals Entered By: [name], CNA  (May 23, 2010 11:38 AM)  Physical Exam  General:  Proportionate height and weight; well developed; no acute distress:   Neck-No JVD; no carotid bruits: Lungs-few bibasilar rales; no rhonchi; no wheezes: Cardiovascular-normal PMI; normal S1 and S2; modest systolic ejection murmur at the cardiac base; fourth heart sound present. Abdomen-BS normal; soft and non-tender without masses or organomegaly:  Musculoskeletal-No deformities, no cyanosis or clubbing: Neurologic-Normal cranial nerves; symmetric strength and tone:  Skin-Warm, no significant lesions: Extremities-Excellent distal pulses except for the left dorsalis pedis, which is one plus.; trace edema:     Impression & Recommendations:  Problem # 1:  PERIPHERAL VASCULAR DISEASE (ICD-443.9) Results after surgery and subsequent percutaneous intervention appeared to be excellent.  We will attempt to optimally manage risk factors to minimize the likelihood of recurrence.  Problem # 2:  ATHEROSCLEROTIC CARDIOVASCULAR DISEASE (ICD-429.2) Course has been extremely benign since catheterization 14 years ago showed insignificant coronary disease.  Problem # 3:  TOBACCO ABUSE (ICD-305.1) Patient encouraged to minimize cigarette consumption with a view towards ultimate abstinence.  Problem # 4:  HYPERLIPIDEMIA (ICD-272.4) Most recent lipid profile was not terribly good despite a maximal dose of the most potent statin.  Compliance with prescription was checked through her pharmacy and appears to be excellent.  WelChol will be added to her regime with a repeat lipid profile in one month.  CHOL: 245 (03/13/2010)   LDL: 157 (03/13/2010)  HDL: 41 (03/13/2010)   TG: 235 (03/13/2010)  Problem # 5:  HYPERTENSION (ICD-401.9) Repeat blood pressure was 150/70.  Despite suboptimal measurements today, blood pressure control has been excellent at all previous visits.  She will collect additional values as an outpatient and return to see the cardiology  nurses in one month.  A future visit with me will be scheduled 10 months hence.  Other Orders: Future Orders: T-Lipid Profile ([id number]) ... 06/21/2010  Patient Instructions: 1)  Your physician recommends that you schedule a follow-up appointment in: 10  MONTHS 2)  Your physician recommends that you return for lab work in: 1 MONTH 3)  Your physician has recommended you make the following change in your medication: START WELCHOL 2 TABLETS BY MOUTH THREE TIMES A DAY WITH MEALS 4)  Your physician discussed the hazards of tobacco use.  Tobacco use cessation is recommended and techniques and options to help you quit were discussed. Prescriptions: ZETIA 10 MG TABS (EZETIMIBE) Take 1 tablet by mouth once a day  #30 x 3   Entered by:   Teressa Lower RN   Authorized by:   Kathlen Brunswick, MD, [facility]   Signed by:   Teressa Lower RN on 06/01/2010   Method used:   Electronically to        [facility] (retail)       [address]       [place], [place]  [address]       Ph: [phone]       Fax: [phone]   RxID:   [id number] WELCHOL 625 MG TABS (COLESEVELAM HCL) Take 2 tablets by mouth three times  day with meals  #180 x 3   Entered by:   Teressa Lower RN   Authorized by:   Kathlen Brunswick, MD, [facility]   Signed by:   Teressa Lower RN on 05/23/2010   Method used:   Electronically to        [facility] (retail)       [address]       [place], [place]  [address]       Ph: [phone]       Fax: [phone]   RxID:   [id number]

## 2010-06-18 ENCOUNTER — Ambulatory Visit (INDEPENDENT_AMBULATORY_CARE_PROVIDER_SITE_OTHER): Payer: Medicare Other

## 2010-06-18 ENCOUNTER — Encounter: Payer: Self-pay | Admitting: Family Medicine

## 2010-06-18 DIAGNOSIS — I10 Essential (primary) hypertension: Secondary | ICD-10-CM

## 2010-06-18 NOTE — Progress Notes (Signed)
S: 1 month nurse visit B: office visit on 05/23/2010, pt was to begin , welchol and zetia A: pt did not begin due to med cost, pt was given discount cards for zetia and crestor      Bp diary scanned into record R: await your response   06/26/10  Reschedule  Lipid profile for one month from now if she plans to take the medication prescribed for her. I cannot locate list of blood pressures.  Please put out a copy and provide to me before closing this note.  Draper Bing, M.D.

## 2010-06-19 ENCOUNTER — Encounter: Payer: Self-pay | Admitting: Family Medicine

## 2010-06-20 ENCOUNTER — Encounter: Payer: Self-pay | Admitting: Family Medicine

## 2010-06-20 ENCOUNTER — Ambulatory Visit (INDEPENDENT_AMBULATORY_CARE_PROVIDER_SITE_OTHER): Payer: PRIVATE HEALTH INSURANCE | Admitting: Family Medicine

## 2010-06-20 VITALS — BP 130/70 | HR 80 | Resp 16 | Ht 63.5 in | Wt 123.0 lb

## 2010-06-20 DIAGNOSIS — F329 Major depressive disorder, single episode, unspecified: Secondary | ICD-10-CM

## 2010-06-20 DIAGNOSIS — F419 Anxiety disorder, unspecified: Secondary | ICD-10-CM

## 2010-06-20 DIAGNOSIS — E785 Hyperlipidemia, unspecified: Secondary | ICD-10-CM

## 2010-06-20 DIAGNOSIS — I10 Essential (primary) hypertension: Secondary | ICD-10-CM

## 2010-06-20 DIAGNOSIS — F17200 Nicotine dependence, unspecified, uncomplicated: Secondary | ICD-10-CM

## 2010-06-20 DIAGNOSIS — M549 Dorsalgia, unspecified: Secondary | ICD-10-CM

## 2010-06-20 DIAGNOSIS — F411 Generalized anxiety disorder: Secondary | ICD-10-CM

## 2010-06-20 MED ORDER — AMLODIPINE BESYLATE 5 MG PO TABS: 5.0000 mg | ORAL_TABLET | Freq: Every day | ORAL | Status: DC

## 2010-06-20 NOTE — Patient Instructions (Signed)
F/U in 4 months.  Pls plan to quit smoking soon, cigarettes are not good for your health.  Blood pressure is great.  Repeat labs  Will be addressed abdominal tenderness next visit.

## 2010-06-24 NOTE — Progress Notes (Signed)
Subjective:    Patient ID: Carla [name], female    DOB: May 13, 1945, 65 y.o.   MRN: [id number]  HPI The PT is here for follow up and re-evaluation of chronic medical conditions, medication management and review of recent lab and radiology data.  Preventive health is updated, specifically  Cancer screening, Osteoporosis screening and Immunization.   Questions or concerns regarding consultations or procedures which the PT has had in the interim are  addressed. The PT denies any adverse reactions to current medications since the last visit.  There are no new concerns.  She continues to smoke, and has been trying to quit with no significant improvement. She c/o ongoing back pain, requesting increase in her medication, already at a maximum   Review of Systems  Constitutional: Negative for fever, chills, activity change, appetite change, fatigue and unexpected weight change.  HENT: Negative for hearing loss, ear pain, congestion, sore throat, rhinorrhea, sneezing, trouble swallowing, neck pain, neck stiffness, postnasal drip and sinus pressure.   Eyes: Negative for photophobia, pain, discharge, redness, itching and visual disturbance.  Respiratory: Positive for cough and shortness of breath. Negative for chest tightness and wheezing.        [Ongoing nicotine use Cardiovascular: Negative for chest pain, palpitations and leg swelling.  Gastrointestinal: Negative for nausea, vomiting, abdominal pain, diarrhea, constipation and blood in stool.  Genitourinary: Negative for dysuria, frequency, hematuria and flank pain.  Musculoskeletal: Positive for back pain. Negative for myalgias, joint swelling, arthralgias and gait problem.  Skin: Negative for rash and wound.  Neurological: Negative for dizziness, tremors, seizures, speech difficulty, weakness, numbness and headaches.  Hematological: Negative for adenopathy. Does not bruise/bleed easily.  Psychiatric/Behavioral: Positive for sleep disturbance.  Negative for suicidal ideas, hallucinations, behavioral problems, confusion and decreased concentration. The patient is not nervous/anxious and is not hyperactive.        [Chronic anxiety      Objective:   Physical Exam  [nursing notereviewed. Constitutional: She is oriented to person, place, and time. She appears well-developed and well-nourished.  HENT:  Head: Normocephalic.  Right Ear: External ear normal.  Left Ear: External ear normal.  Mouth/Throat: No oropharyngeal exudate.  Eyes: Conjunctivae and EOM are normal. Right eye exhibits no discharge. Left eye exhibits no discharge. No scleral icterus.  Neck: Normal range of motion. Neck supple. No JVD present. No tracheal deviation present. No thyromegaly present.  Cardiovascular: Normal rate, regular rhythm, normal heart sounds and intact distal pulses.   No murmur heard. Pulmonary/Chest: Effort normal. No stridor. No respiratory distress. She has wheezes. She has no rales. She exhibits no tenderness.       Decreased breath sounds, scattered wheezes  Abdominal: Soft. Bowel sounds are normal. There is no tenderness. There is no rebound and no guarding.  Musculoskeletal: She exhibits no edema.       Decreased ROM thoracolumbar spine  Lymphadenopathy:    She has no cervical adenopathy.  Neurological: She is alert and oriented to person, place, and time. No cranial nerve deficit. Coordination normal.  Skin: Skin is warm and dry. No rash noted. No erythema.  Psychiatric: She has a normal mood and affect. Her behavior is normal. Judgment and thought content normal.          Assessment & Plan:  1.Hypertension:Controlled, no changes in medication.  2.Hyperlipidemia: labs to be updated, low fat diet discussed and encouraged , continue meds. 3.anxiety; improved, encouraged to add SSRI's pt refuses still 4.nicotine : unchanged, cessation counseling done. Back pain; deteriorated,  no change in meds.

## 2010-06-27 LAB — URINALYSIS, ROUTINE W REFLEX MICROSCOPIC
Glucose, UA: NEGATIVE mg/dL
pH: 6 (ref 5.0–8.0)

## 2010-06-27 LAB — URINE MICROSCOPIC-ADD ON

## 2010-06-27 LAB — URINE CULTURE

## 2010-07-02 LAB — DIFFERENTIAL
Basophils Relative: 0 % (ref 0–1)
Eosinophils Absolute: 0 10*3/uL (ref 0.0–0.7)
Eosinophils Absolute: 0.1 10*3/uL (ref 0.0–0.7)
Eosinophils Absolute: 0.1 10*3/uL (ref 0.0–0.7)
Eosinophils Relative: 0 % (ref 0–5)
Eosinophils Relative: 2 % (ref 0–5)
Eosinophils Relative: 2 % (ref 0–5)
Lymphocytes Relative: 45 % (ref 12–46)
Lymphs Abs: 3.7 10*3/uL (ref 0.7–4.0)
Lymphs Abs: 4.5 10*3/uL — ABNORMAL HIGH (ref 0.7–4.0)
Monocytes Absolute: 0.4 10*3/uL (ref 0.1–1.0)
Monocytes Absolute: 0.5 10*3/uL (ref 0.1–1.0)
Monocytes Relative: 6 % (ref 3–12)
Monocytes Relative: 6 % (ref 3–12)
Monocytes Relative: 7 % (ref 3–12)

## 2010-07-02 LAB — URINE MICROSCOPIC-ADD ON

## 2010-07-02 LAB — CBC
HCT: 39.3 % (ref 36.0–46.0)
HCT: 40.2 % (ref 36.0–46.0)
Hemoglobin: 14.1 g/dL (ref 12.0–15.0)
Hemoglobin: 14.3 g/dL (ref 12.0–15.0)
MCHC: 34.7 g/dL (ref 30.0–36.0)
MCHC: 35 g/dL (ref 30.0–36.0)
MCHC: 35.6 g/dL (ref 30.0–36.0)
MCV: 92.9 fL (ref 78.0–100.0)
MCV: 92.9 fL (ref 78.0–100.0)
Platelets: 202 10*3/uL (ref 150–400)
RBC: 4.33 MIL/uL (ref 3.87–5.11)
RBC: 4.37 MIL/uL (ref 3.87–5.11)
RBC: 5.26 MIL/uL — ABNORMAL HIGH (ref 3.87–5.11)
RDW: 13.7 % (ref 11.5–15.5)
WBC: 7.8 10*3/uL (ref 4.0–10.5)

## 2010-07-02 LAB — COMPREHENSIVE METABOLIC PANEL
ALT: 14 U/L (ref 0–35)
AST: 20 U/L (ref 0–37)
CO2: 28 mEq/L (ref 19–32)
Calcium: 9.7 mg/dL (ref 8.4–10.5)
GFR calc Af Amer: 43 mL/min — ABNORMAL LOW (ref >60)
GFR calc non Af Amer: 36 mL/min — ABNORMAL LOW (ref >60)
Potassium: 3.7 mEq/L (ref 3.5–5.1)
Sodium: 138 mEq/L (ref 135–145)
Total Protein: 7 g/dL (ref 6.0–8.3)

## 2010-07-02 LAB — BASIC METABOLIC PANEL
BUN: 9 mg/dL (ref 6–23)
CO2: 29 mEq/L (ref 19–32)
CO2: 31 mEq/L (ref 19–32)
Calcium: 9.2 mg/dL (ref 8.4–10.5)
Chloride: 104 mEq/L (ref 96–112)
Chloride: 109 mEq/L (ref 96–112)
GFR calc Af Amer: >60 mL/min (ref >60)
GFR calc Af Amer: >60 mL/min (ref >60)
GFR calc non Af Amer: >60 mL/min (ref >60)
Glucose, Bld: 101 mg/dL — ABNORMAL HIGH (ref 70–99)
Potassium: 3.2 mEq/L — ABNORMAL LOW (ref 3.5–5.1)
Potassium: 3.3 mEq/L — ABNORMAL LOW (ref 3.5–5.1)
Sodium: 138 mEq/L (ref 135–145)

## 2010-07-02 LAB — URINALYSIS, ROUTINE W REFLEX MICROSCOPIC
Glucose, UA: NEGATIVE mg/dL
Hgb urine dipstick: NEGATIVE
Specific Gravity, Urine: >1.03 — ABNORMAL HIGH (ref 1.005–1.030)
Urobilinogen, UA: 1 mg/dL (ref 0.0–1.0)
pH: 5.5 (ref 5.0–8.0)

## 2010-07-02 LAB — PHOSPHORUS
Phosphorus: 2.7 mg/dL (ref 2.3–4.6)
Phosphorus: 3.1 mg/dL (ref 2.3–4.6)

## 2010-07-06 ENCOUNTER — Other Ambulatory Visit: Payer: Self-pay | Admitting: Family Medicine

## 2010-07-06 DIAGNOSIS — F419 Anxiety disorder, unspecified: Secondary | ICD-10-CM

## 2010-08-01 ENCOUNTER — Other Ambulatory Visit: Payer: Self-pay | Admitting: Family Medicine

## 2010-08-07 NOTE — Letter (Signed)
December 16, 2007    Milus Mallick. Lodema Hong, M.D.  [address], [address]  [place], [place] [address]   RE:  JACOYA, BAUMAN  MRN:  [id number]  /  DOB:  [birth year]   Dear Claris Che,   It is my pleasure evaluating Ms. Ipock in consultation in the office  today at your request.  As you know, I have followed this woman on a  number of occasions in the past, but she repeatedly failed to return for  followup.  She has premature atherosclerosis, having undergone  aortobifem surgery at an early age.  Since then, she has done  astoundingly well considering her noncompliance with medical regimes and  continued cigarette smoking.  She had insignificant coronary disease,  had catheterization in 1997.  Her most recent stress test was in 2006  and was negative.  She had carotid ultrasound approximately a year ago  that also showed insignificant atherosclerosis.  She has had untreated  hyperlipidemia recently due to failure to obtain her medicine.  She was  recently started on Vytorin, but cannot afford it.   Her other medications include hydrochlorothiazide 25 mg daily, aspirin  81 mg daily, amlodipine 5 mg daily, and cetirizine 10 mg daily.   Past medical, social and family history as well as review of systems  were updated.  Her most recent admission to the hospital seems to be in  2006.  She has had no recent surgery.  She performs chores at home  including feeding some pet animals and doing the housework.  She finds  that she is less capable of this than in past years.   She recently complained in your office of exertional dyspnea.  She has  some associated vague left chest or shoulder aching with this.  Symptoms  resolve promptly with rest.  There is no radiation.   PHYSICAL EXAMINATION:  GENERAL:  Pleasant woman with a raspy voice, in  no acute distress.  VITAL SIGNS:  Weight is 134, nine pounds less than in 1999.  Blood  pressure 120/70, heart rate 90 and regular, respirations 14.  NECK:  No jugular venous distention; normal carotid upstrokes without  bruits.  ENDOCRINE:  No thyromegaly.  HEMATOPOIETIC:  No adenopathy.  HEENT:  EOMs full; normal oral mucosa.  LUNGS:  Clear; some prolongation of the expiratory phase.  CARDIAC:  Normal first and second heart sounds; normal PMI.  ABDOMEN:  Soft and nontender; no organomegaly.  EXTREMITIES:  Distal pulses intact except for a decreased right  posterior tibial pulse.  NEUROLOGIC:  Symmetric strength and tone; normal cranial nerves.   EKG:  Normal sinus rhythm; abnormal R-wave progression suggestive of  prior anteroseptal myocardial infarction; left atrial abnormality.  When  compared to a previous tracing of July 19, 2003, there has been no  significant interval change.   IMPRESSION:  Ms. Gillies continues to fail to control CV risk factors.  Her dyspnea is likely related to physical deconditioning and chronic  lung disease.  Of course, coronary ischemia is a very significant  possibility.  Moreover, her EKG suggests scarring in the distribution of  the left anterior descending coronary artery.  We will further  investigate these issues with a stress echocardiogram.  If negative,  continued attention to risk factors will be the major approach.  I have  substituted simvastatin 80 mg for her Vytorin since she will probably  not take the latter due to its cost.  I will check a lipid profile in 2  months and see this nice woman again after her stress test has been  completed.  Thank you so much for sending her back to me.    Sincerely,      Gerrit Friends. Dietrich Pates, MD, [facility]  Electronically Signed    RMR/MedQ  DD: 12/16/2007  DT: 12/17/2007  Job #: [id number]

## 2010-08-07 NOTE — H&P (Signed)
NAME:  Carla [name], Carla [name]              ACCOUNT NO.:  [id number]   MEDICAL RECORD NO.:  [id number]          PATIENT TYPE:  INP   LOCATION:  A318                          FACILITY:  [facility]   PHYSICIAN:  Tilford Pillar, MD      DATE OF BIRTH:  12/11/1945   DATE OF ADMISSION:  08/23/2008  DATE OF DISCHARGE:  06/04/2010LH                              HISTORY & PHYSICAL   CHIEF COMPLAINT:  Abdominal pain, nausea, and vomiting.   HISTORY OF PRESENT ILLNESS:  The patient is a 65 year old female with a  history of atherosclerotic vascular disease and hypertension, who  presents with approximately 4 days of increasing abdominal pain.  This  has been constant, has been in the bilateral lower quadrants.  There is  no significant radiation.  She describes that it is colicky in nature.  She has had similar symptomatologies in the past.  She does not have any  current nausea and vomiting.  She has had flatus, but her last bowel  movement was approximately 5 days ago.  At that time, it was a normal  bowel movement.  No melena.  No hematochezia.  Prior to admission, she  has not had any recent emesis.  She has had no sick contacts.   PAST MEDICAL HISTORY:  Atherosclerotic vascular disease, peripheral  vascular disease, hypertension.   PAST SURGICAL HISTORY:  Cholecystectomy.  She has had previous aorta  bypass graft and a hysterectomy.   MEDICATIONS:  Pressor, Xanax.  She is on a blood pressure medication,  but does not remember the name of this.   ALLERGIES:  No known drug allergies.   SOCIAL HISTORY:  Positive tobacco use.  No alcohol use.  No recreational  drug use.   PHYSICAL EXAMINATION:  VITAL SIGNS:  Temperature is 97.0, heart rate 71,  respirations 18, blood pressure 106/56, she is 96% of 02 saturation on  room air.  GENERAL:  She is not in any acute distress.  She is alert and  oriented x3.  HEENT:  Scalp, no deformities, no masses.  Eyes, pupils are equal,  round, and reactive.   Extraocular movements are intact.  No scleral  icterus or conjunctival pallor is noted.  Oral mucosa is pink.  Normal  occlusion.  NECK:  Trachea is midline, no lymphadenopathy.  PULMONARY:  Unlabored respirations.  She is clear to auscultation  bilaterally.  CARDIOVASCULAR:  Regular rate and rhythm.  She has 2+ radial and 1+  dorsalis pedis pulses bilaterally.  ABDOMEN:  She does have diminished bowel sounds.  Abdomen is soft, flat.  She has moderate bilateral lower quadrant abdominal tenderness.  She has  no peritoneal signs.  She has no masses.  No hernias.  She has no  guarding.  EXTREMITIES:  Warm, dry.   PERTINENT RADIOGRAPHIC AND LABORATORY RESULTS:  CBC; white blood cell  count is 7.3; down from 13.0; hemoglobin 14.3, down from 17.0 on  admission in the emergency department; hematocrit 40.7; platelets 205.  Basic metabolic panel; sodium 138, potassium 3.4, chloride 104, bicarb  31, BUN 16, creatinine 104, blood glucose 99.  CT of  abdomen  demonstrates no evidence of any free air, there is a small amount of  free pelvic fluid.  She had loops of dilated small bowel.  There is no  clear areas of transition and no masses are appreciated.   ASSESSMENT AND PLAN:  Small bowel obstruction.  At this time, we will  continue with conservative management.  We will continue with IV fluid  hydration, analgesic control, and bowel rest.  Should her nausea and  vomiting return, a nasogastric decompression will be performed.  At this  time, we will hold on the placement of the nasogastric tube.  Signs,  symptoms, and indications before proceeding to the operating room were  discussed at length with the patient including increasing white blood  cells, increasing abdominal pain, change in heart rate and blood  pressure despite the conservative management.  She does understand this  and wishes to proceed with conservative management.  Should she fail  with conservative management, plan is to  proceed to the operating room  will be initiated.  She understands this and I will continue to follow  her closely.       Tilford Pillar, MD  Electronically Signed     BZ/MEDQ  D:  09/01/2008  T:  09/02/2008  Job:  [id number]   cc:   Primary Care Physician

## 2010-08-07 NOTE — Discharge Summary (Signed)
NAME:  Carla [name], Carla [name]              ACCOUNT NO.:  [id number]   MEDICAL RECORD NO.:  [id number]          PATIENT TYPE:  INP   LOCATION:  A318                          FACILITY:  [facility]   PHYSICIAN:  Tilford Pillar, MD      DATE OF BIRTH:  [birth year]   DATE OF ADMISSION:  08/23/2008  DATE OF DISCHARGE:  06/04/2010LH                               DISCHARGE SUMMARY   ADMISSION DIAGNOSIS:  Small bowel obstruction.   DISCHARGE DIAGNOSES:  Resolution of small bowel obstruction,  atherosclerotic vascular disease, peripheral vascular disease, and  hypertension.   DISPOSITION:  Home.   ADMITTING SURGEON:  Tilford Pillar, MD   BRIEF HISTORY AND PHYSICAL:  Please see the admission history and  physical for the complete H and P.  The patient is a 65 year old female  with previous abdominal surgery, who presents with nausea and vomiting.  Workup was consistent with small bowel obstruction.  She was admitted  for continued management and intervention.   HOSPITAL COURSE:  The patient was admitted on August 23, 2008, for  conservative management of her small bowel obstruction.  She was treated  with IV fluid hydration and bowel rest.  On the day 1 of hospital stay,  she did develop increasing flatus with an increase in her resolution of  the symptomatology.  She was slowly advanced on clear liquids and during  her hospital course, she did resume bowel function.  With increasing  bowel function, her diet was advanced.  On August 26, 2008, the patient was  tolerating her regular diet.  She was ambulatory and had return of  normal bowel function; therefore, it was discussed with the patient at  this time.  Plan is for discharge and she is to be discharged home.   DISCHARGE INSTRUCTIONS:  The patient was instructed to resume a normal  diet, avoid high residual foods such as red meats and bread.  She may  resume normal activities.  She is to resume all previously prescribed  home medications.  She is to call  should she develop any  symptomatologies.  She is to return to see me on an as needed basis.      Tilford Pillar, MD  Electronically Signed     BZ/MEDQ  D:  09/01/2008  T:  09/02/2008  Job:  [id number]

## 2010-08-07 NOTE — Letter (Signed)
May 10, 2008    Milus Mallick. Lodema Hong, MD  [address]  [place], [place] [address]   RE:  SHRESHTA, MEDLEY  MRN:  [id number]  /  DOB:  18-Apr-1945   Dear Claris Che,   Ms. Goh returns to the office for continued assessment and  treatment of coronary artery disease and evaluation of her recent  episode of syncope.  Since her last visit, she has done fine.  She feels  as if she is back to baseline.  She has had no episodes of dizziness, no  falling, and certainly no loss of consciousness.   The only change in her medication has been the substitution of  rosuvastatin 40 mg daily for simvastatin 80 mg daily.   PHYSICAL EXAMINATION:  GENERAL:  Pleasant woman in no acute distress.  VITAL SIGNS:  The weight is 136, 2 pounds more than at her last visit.  Blood pressure 120/70, heart rate 85 and regular, respirations 12 and  unlabored.  NECK:  No jugular venous distention; no carotid bruits.  LUNGS:  Inspiratory and expiratory rhonchi; prolonged expiratory phase.  CARDIAC:  Normal first and second heart sounds; minimal basilar systolic  ejection murmur.  ABDOMEN:  Soft and nontender; normal bowel sounds; no bruits; no  organomegaly.  EXTREMITIES:  2+ dorsalis pedis on the left; 1+ on the right; no edema.   Echocardiogram showed mild LVH with normal LV systolic function.  She  has a sclerotic aortic valve with minimal, if any, stenosis.  Her  pharmacologic stress nuclear study was essentially normal.  There was no  evidence for significant obstructive coronary disease.  LV systolic  function was again normal.  Her event recorder has not provided much  information.  She has had some difficulty in using it appropriately.   IMPRESSION:  Ms. Deacon is doing well on all fronts except for  continued cigarette smoking.  She was once again advised to discontinue  use of tobacco products.  She does not appear to have critical coronary  disease.  There has been no progression of  peripheral vascular disease.  We will assure excellent control of hyperlipidemia, continue to monitor  blood pressure, and plan a return office visit in 6 months.  A lipid  profile and chemistry profile will be obtained in 2-week.    Sincerely,      Gerrit Friends. Dietrich Pates, MD, [facility]  Electronically Signed    RMR/MedQ  DD: 05/10/2008  DT: 05/11/2008  Job #: [id number]

## 2010-08-07 NOTE — Assessment & Plan Note (Signed)
[facility]                       [place] CARDIOLOGY OFFICE NOTE   NAME:[name], [name]                     MRN:          [id number]  DATE:04/18/2008                            DOB:          02-16-1946    PRIMARY CARDIOLOGIST:  Gerrit Friends. Dietrich Pates, MD, [facility].   PRIMARY CARE PHYSICIAN:  Milus Mallick. Lodema Hong, M.D.   REASON FOR VISIT:  Syncope and chest pain.   HISTORY OF PRESENT ILLNESS:  Carla [name] is a 65 year old female  patient with a history of mild-to-moderate nonobstructive coronary  disease by cardiac catheterization in 1997 as well as peripheral  arterial disease status post aortobifemoral bypass in 1995 who is  referred back to our office today by [name] for evaluation of  syncope and chest pain.  The patient notes development of chest pain  over the last couple of months.  This is a cramping like sensation in  her epigastric and lower sternal region as well as the left side of her  chest.  She notes symptoms with exertion as well as without exertion.  It lasts for just a few seconds.  She denies any associated shortness of  breath, nausea, diaphoresis or radiating symptoms.  She does have  chronic dyspnea with exertion.  She describes NYHA class II B symptoms.  There has been no significant change.  She denies orthopnea, PND or  pedal edema.  She noted an episode of syncope about a month ago.  At  that time she was washing dishes.  She had no warning or prodromal  symptoms.  She woke up on the floor when her grandchildren were picking  her up to her feet.  She denies any significant injuries.  She has had  no recurrent symptoms since that time.   PAST MEDICAL HISTORY:  Is as outlined above.  She also has:  1. A history of hypertension.  2. Hyperlipidemia.  3. Aortic insufficiency.  4. Chronic low back pain.  5. She is status post total abdominal hysterectomy and bilateral      salpingo-oophorectomy.  6. Adhesiolysis in 2000.  7. Status post cholecystectomy.   MEDICATIONS:  1. HCTZ 25 mg daily.  2. Aspirin  81 mg daily.  3. Amlodipine 5 mg nightly.  4. Cetirizine 10 mg daily p.r.n.  5. Simvastatin  80 mg daily.  6. Xanax p.r.n.  7. Claritin-D p.r.n.   ALLERGIES:  No known drug allergies.   SOCIAL HISTORY:  She continues to smoke cigarettes at about a half pack  per day.   FAMILY HISTORY:  Significant for CAD.   REVIEW OF SYSTEMS:  Please see HPI.  Denies any fever, chills, cough,  melena, hematuria, dysuria.  Rest of the review of systems is negative.   Electrocardiogram reveals sinus rhythm with a heart rate of 90, normal  axis, nonspecific ST-T wave changes, poor R-wave progression, RV  conduction delay.  No significant change since prior tracing dated  December 16, 2007.   LABORATORIES:  Obtained through [name]:  Sodium 140, potassium 4,  glucose 110, BUN 13, creatinine 0.82, total cholesterol 206,  triglycerides 254, HDL 35, LDL  120, alkaline phosphatase 126, AST 19,  ALT 17.   PHYSICAL EXAM:  ORTHOSTATIC VITAL SIGNS:  In the office today, blood  pressure lying 144/79 with a pulse of 90; sitting 139/78 with a pulse of  91.  Standing 148/78 with a pulse of 91, after 2 minutes 149/83 with a  pulse of 97.  After 5 minutes 144/83 with a pulse of 104.  HEENT:  normal  NECK: without JVD  CARDIAC:  Normal S1, S2.  Regular rate and rhythm.  2/6 systolic  ejection murmur best heard at the sternal border.  LUNGS:  Clear  ABDOMEN:  soft  EXTREMITIES:  No edema  NEURO:  CN's II-XII intact  SKIN:  warm and dry   ASSESSMENT/PLAN:  1. Syncope.  [name] presents with one episode of unexplained      syncope.  She had no prodromal symptoms.  She has also had some      atypical sounding chest pain.  She certainly has risk factors for      coronary artery disease.  I discussed her case further with Dr.      Dietrich Pates today.  At this point in time we plan to proceed with an      echocardiogram  to assess her left ventricular function as well as      her valvular status.  She does have a murmur on exam.  We will also      proceed with a stress Myoview study to rule out the possibility of      ischemia.  She will also be placed on a 21-day event monitor to      rule out the possibility of arrhythmia.  The patient has been      advised to do no driving until further notice.  I did review her      electrocardiogram today with Dr. Dietrich Pates.  At this point in time      it does not appear that she has Brugada-like changes.  These      changes have been noted in the past.  There is no evidence of pre-      excitation on electrocardiogram as well.  She will return in      several weeks for further recommendations.  2. Mild-to-moderate nonobstructive coronary disease by catheterization      in 1997.  As noted above, she has had some chest pain.  We will      proceed with a stress Myoview study.  3. Dyslipidemia.  Her recent lipid panel is significantly abnormal.      Her LDL really should be around 70 given her history of peripheral      arterial disease and coronary artery disease.  She has been taking      simvastatin 80 mg a day since she last saw Dr. Dietrich Pates in      September 2009.  I have asked her to switch to Crestor 40 mg a day.      If her insurance will cover it to the point where she can afford      it, she will let Carla [name] know.  We will recheck lipids and liver      function studies in 3 months.  If not, we will discuss this further      when she returns for followup.  4. Tobacco abuse.  She has been advised to quit.   DISPOSITION:  The patient will follow up with Dr. Dietrich Pates in the next 3-  4 weeks or sooner p.r.n.      Tereso Newcomer, PA-C  Electronically Signed      Gerrit Friends. Dietrich Pates, MD, [facility]  Electronically Signed   SW/MedQ  DD: 04/18/2008  DT: 04/18/2008  Job #: [id number]   cc:   Milus Mallick. Lodema Hong, M.D.

## 2010-08-10 NOTE — Discharge Summary (Signed)
NAMEQUIERRA, Carla [name]              ACCOUNT NO.:  [id number]   MEDICAL RECORD NO.:  [id number]          PATIENT TYPE:  INP   LOCATION:  2905                         FACILITY:  [facility]   PHYSICIAN:  Jesse Sans. Wall, M.D.   DATE OF BIRTH:  Jan 06, 1946   DATE OF ADMISSION:  03/01/2005  DATE OF DISCHARGE:  03/02/2005                                 DISCHARGE SUMMARY   CARDIOLOGIST:  Ravenna Bing, M.D. [facility].   PRIMARY CARE DOCTOR:  Milus Mallick. Lodema Hong, M.D.   DISCHARGING DIAGNOSIS:  1.  Chest pain, atypical.  2.  Negative cardiac enzymes.  3.  Status post exercise Myoview.  4.  Ejection fraction 75%, negative for ischemia.   PAST MEDICAL HISTORY:  1.  Peripheral vascular disease status post aortobifem, status post femoral      artery stents.  2.  Hypertension.  3.  Hyperlipidemia.  4.  Anxiety disorder.  5.  Degenerative joint disease/chronic pain.  6.  Cardiac catheterization in 1994 showing nonobstructive coronary artery      disease.  7.  Last stress Myoview prior to this admission was in November of 2004, EF      normal, negative ischemia.  8.  Last echo November, 2004, mild aortic insufficiency, antibiotic      prophylaxis.  9.  Carotid Dopplers May, 2005, mild plaque.  10. Ongoing tobacco abuse.   [name] is a 65 year old Caucasian female with history as stated above  who presented to [facility] with complaints of left sided chest  pain with shaking and shooting pains into left side, patient states this was  not similar to her anxiety attacks except for the shaking.  Patient  transferred to [facility] for further evaluation.  Cardiac enzymes are  negative.  Patient for gated Myoview, tolerated procedure without  complications.  Results as stated above.  The patient being discharged home  to followup with [name], in [place], and Dr. Dietrich Pates within the  next 2 months.  At discharge, hemoglobin 14.3, cardiac enzymes negative x2,  EKG normal sinus rhythm,  afebrile, blood pressure stable.  Patient being  discharged home.  Medications as previous, including Vytorin 10/80 mg daily,  enalapril 10 mg daily,  Effexor XR 75 mg daily, HCTZ 25 mg daily.  I have  given patient a prescription  for Prevacid 30 mg daily which she has used in the past.  She is instructed  to resume her alprazolam, fentanyl patch, Vicodin, __________ as previously  used or prescribed.  Followup with [name] on December 13th, the patient  already has an appointment scheduled, Dr. Dietrich Pates in the next 2 months,  patient to call for appointment.      Dorian Pod, NP      Jesse Sans. Wall, M.D.  Electronically Signed    MB/MEDQ  D:  03/02/2005  T:  03/03/2005  Job:  [id number]   cc:   [place] Bing, M.D. [facility]  [address]  [address]  [place]  [place] [address]   Milus Mallick. Lodema Hong, M.D.  Fax: [phone]

## 2010-08-10 NOTE — Op Note (Signed)
NAME:  Carla [name], Carla [name]              ACCOUNT NO.:  [id number]   MEDICAL RECORD NO.:  [id number]          PATIENT TYPE:  AMB   LOCATION:  DAY                           FACILITY:  [facility]   PHYSICIAN:  Kassie Mends, M.D.      DATE OF BIRTH:  1945/08/15   DATE OF PROCEDURE:  06/23/2006  DATE OF DISCHARGE:                               OPERATIVE REPORT   PROCEDURE:  Colonoscopy.   INDICATIONS FOR PROCEDURE:  [name] is a 65 year old female with a  personal history of polyps.  She presents for colon cancer screening.   FINDINGS:  1. Normal colon without evidence of polyps, masses, inflammatory      changes, diverticula or AVMs.  2. Normal retroflex view of the rectum.   RECOMMENDATIONS:  1. Screening colonoscopy in five years with a two day MiraLAX prep.      [name] had a significant amount of retained liquid stool      throughout the colon.  The liquid stool had to be aspirated, but      adequate visualization of the colonic mucosa was obtained.  2. High fiber diet.  Handout was given on high fiber diet.  3. Follow up with Dr. [name].   MEDICATIONS:  1. Demerol 100 mg IV.  2. Versed 4 mg IV.  3. Phenergan 25 mg IV.   PROCEDURE TECHNIQUE:  Physical exam was performed and informed consent  was obtained from the patient after explaining the benefits, risks and  alternatives to the procedure.  The patient was connected to the monitor  and placed in the left lateral position.  Continuous oxygen was provided  by nasal cannula and IV medications administered through an indwelling  cannula.  After administration of sedation, and rectal exam, the scope  was  advanced under direct visualization to the cecum.  The scope was  subsequently removed slowly by carefully examining the ccolor, anatomy,  texture, and integrity of the mucosa on the way out.  The patient was  recovered in endoscopy suite and discharged to home in satisfactory  condition.      Kassie Mends,  M.D.  Electronically Signed     SM/MEDQ  D:  06/23/2006  T:  06/23/2006  Job:  [id number]   cc:   Milus Mallick. Lodema Hong, M.D.  Fax: [phone]

## 2010-08-10 NOTE — H&P (Signed)
NAME:  Carla [name], Carla [name]                        ACCOUNT NO.:  [id number]   MEDICAL RECORD NO.:  [id number]                   PATIENT TYPE:  INP   LOCATION:  IC03                                 FACILITY:  [facility]   PHYSICIAN:  Hanley Hays. Dechurch, M.D.           DATE OF BIRTH:  05/07/1945   DATE OF ADMISSION:  02/14/2003  DATE OF DISCHARGE:                                HISTORY & PHYSICAL   HISTORY OF PRESENT ILLNESS:  A 65 year old Caucasian female followed by  Milus Mallick. Lodema Hong, M.D., with a history of vascular disease, status post  what sounds like aortofemoral bypass in 1994, hyperlipidemia, history of  nonobstructive coronary artery disease, but no records are available, and  ongoing tobacco abuse, who presents with chest pain since Friday.  However,  she had chest pain on Friday after eating and egg salad sandwich, which  sounded typical for a GI etiology.  She actually took an over the counter  Prilosec and the pain eventually subsided.  However, the following day she  had some substernal burning.  Today the patient noted about two hours after  drinking some coffee that she had some substernal pressure, aching-type  sensation which was very different from the sensations she has had in the  past.  She has never had any pain of this description.  The patient actually  went about doing some housework, i.e. making beds, and the pain did not wax  or wane.  She had no associated symptoms with the exception of some mild  nausea.  She did have an episode of emesis x 1.  She described white phlegm,  but no blood.  She has had no change in her bowel habits.  The patient notes  not change in her exercise tolerance and no dyspnea on exertion out of the  ordinary.  She has had no palpitations, dizziness, or other symptoms.  She  notes fatigue with activity, but this is variable and has been so for many  years.   FAMILY MEDICAL HISTORY:  Pertinent for heart problems in that a sister had  bypass or similar operation in her 12s.  Her mother died in her 23s with the  same.  Her father was 67 at the time of his death with a CVA.  She has one  son with hypertension.   SOCIAL HISTORY:  She is married.  She has three sons.  No alcohol abuse.  She smokes half of a pack per day for many years.  She actually quit for a  short time around her vascular procedure, but then resumed.   MEDICATIONS:  1. Vasotec 10 mg daily.  2. Plavix 75 mg daily.  3. Hydrochlorothiazide 25 mg daily.  4. Lipitor 40 mg daily.  5. Zetia 10 mg daily.  6. Xanax 0.5 mg t.i.d.  7. Vicodin 10/650 mg which she takes three to four times daily.   She has no history of over the  counter drug use with the exception of an  occasional Tylenol.  She denies any nonsteroidals or other medications.   ALLERGIES:  She has no known drug allergies.   REVIEW OF SYSTEMS:  Pertinent for chronic constipation and sluggish bowel  for which she uses p.r.n. Zelnorm.  She has pain in her legs, which is  chronic, for which she uses Vicodin.  She also has a history of bulging disk  and chronic pain in her back and neck.  She actually went to therapy for a  short time due to increasing pain and discontinued.  She has chronic anxiety  and chronic reflux per her description.  She has occasional coughing at  night, raising the question of reflux.  She has bronchitis twice a day.  She  has an obvious smoker's cough.  Her aspirin was stopped by her primary M.D.  secondary to bruising of her extremities.   PAST SURGICAL HISTORY:  1. The patient states vascular stents, but it sounds like she has had an     aortobifemoral bypass in 1994 at [facility].  2. She is status post total abdominal hysterectomy.  3. Status post cholecystectomy.  4. She is status post excision of a mass on her back, apparently benign.   PAST MEDICAL HISTORY:  1. History of degenerative joint disease.  2. Anxiety disorder.   PHYSICAL EXAMINATION:  GENERAL  APPEARANCE:  A well-developed, well-nourished  female who appears her stated age.  VITAL SIGNS:  The blood pressure is 130/60.  The pulse is in the 60s and  regular.  Respirations are unlabored with an occasional dry cough.  NECK:  Supple.  There are no bruits and no adenopathy.  HEENT:  The oropharynx is moist.  The teeth are in fair repair.  LUNGS:  Diminished.  No rales or rhonchi are present.  HEART:  Regular rate and rhythm.  No murmurs, rubs, or gallops.  ABDOMEN:  Soft and nontender.  Normoactive bowel sounds.  No masses present.  EXTREMITIES:  Without cyanosis, clubbing, or edema.  Pulses are present,  slightly diminished, but equal bilaterally.  NEUROLOGIC:  Completely intact.   LABORATORY DATA:  Normal chest x-ray with mild bronchitic changes.  EKG is  normal sinus rhythm.  Small inferior Qs in 3 and AVF, but no acute ischemic  changes.  The laboratories are otherwise normal with the exception of a  hemoglobin of 17 and a hematocrit of 56.   ASSESSMENT AND PLAN:  1. Chest pain in a 65 year old with known vascular disease and multiple risk     factors, with no recent evaluation with a chest pain that is different     than her usual reflux-type symptoms.  She certainly warrants ruling out     acute coronary syndrome.  If this is unremarkable, then certainly further     evaluation as an outpatient would be reasonable.  2. Gastrointestinal reflux disease.  Certainly this is playing a role in     some of her symptomatology.  We will begin Protonix and a single dose of     Pepcid this evening.  3. Erythrocytosis in a smoker.  Hemoglobin 17.2 and hematocrit 50.3, which     will be further evaluated with an arterial blood gas.  Again, this     possibly could be playing a role in her symptomatology, but further     evaluation is needed.  4. Hypertension under reasonable control on this current dose.  No changes  at present. 5. Hyperlipidemia.  She had a lipid profile in October  of this year with a     cholesterol of 175, triglycerides 148,     LDL about 99, and VLDL 30.  6. Anxiety disorder.  Will continue the patient's Xanax during this hospital     stay.  7. Chronic syndrome on hydrocodone, which will be continued.     ___________________________________________                                         Hanley Hays Josefine Class, M.D.   FED/MEDQ  D:  02/14/2003  T:  02/14/2003  Job:  [id number]

## 2010-08-10 NOTE — H&P (Signed)
NAMESUZANNA, [name]              ACCOUNT NO.:  [id number]   MEDICAL RECORD NO.:  [id number]          PATIENT TYPE:  INP   LOCATION:  2905                         FACILITY:  [facility]   PHYSICIAN:  Jesse Sans. Wall, M.D.   DATE OF BIRTH:  [birth year]   DATE OF ADMISSION:  03/01/2005  DATE OF DISCHARGE:                                HISTORY & PHYSICAL   PRIMARY CARE PHYSICIAN:  [name].   CARDIOLOGIST:  Dr. Dietrich Pates.   HISTORY OF PRESENT ILLNESS:  Carla [name] is a 65 year old white female who  was transferred from [facility] ER for cardiac evaluation secondary to chest  discomfort. She stated that today around 11:00 a.m. while driving she  gradually developed an anterior left-sided chest lower sternal pain which  she described as shooting. She had some mild dizziness associated with this  and gave it a 6 on a scale of  zero to 10. She denied any associated  shortness of breath, nausea, vomiting or diaphoresis. She stopped her car  and rested for a few minutes and her symptoms resolved in less than 5  minutes. She resumed driving and her symptoms gradually returned at a 5 on a  scale  zero to 10. She drove to [facility] emergency room where she states  she began shaking and then developed shooting pains that went around to both  sides. She stated that this episode lasted several seconds and she has not  had any further episode. She feels that the above is more like an anxiety  attack and she states that her heart is fine. However, she clarifies that  the shaking resembles her anxiety attacks but the discomfort is different.  She states that they told her at [facility] that she could be discharged  tomorrow morning and she is insistent upon that because she states that her  husband is supposed to leave for [place] tomorrow at 1 p.m. for work. In the  emergency room she was placed on IV heparin and nitroglycerin and is now  complaining of a headache.   PAST MEDICAL HISTORY:   ALLERGIES:  No known drug allergies.   MEDICATIONS PRIOR TO ADMISSION:  The patient does not know. After reviewing  a list from pharmacy, patient states that she is not taking multiple  medications that were listed and is difficult to ascertain which medication  she is taking. She is pretty sure she has taken alprazolam 0.5 mg t.i.d.,  Vytorin 10/80 daily, she is not sure if she is taking enalapril or HCTZ, is  unsure of dose. She occasionally takes potassium, unknown dose p.r.n.,  Lortab 10 mg t.i.d.   PAST MEDICAL HISTORY:  1.  Hypertension.  2.  Hyperlipidemia.  3.  Catheterization in 1994 supposedly showed nonobstructive coronary artery      disease. Last stress Myoview on February 16, 2003 showed EF of 62%, no      ischemia. However, she had poor exercise tolerance. Her last      echocardiogram on February 15, 2003 showed mild AI and she was      recommended antibiotic prophylaxis.  4.  She also has a history of peripheral vascular disease. She states she      has had femoral artery stent. However, on physical exam, it appears that      she may have had an aortobifemoral. It is noted the medical records has      been called in regards to microfilm records on her catheterization and      peripheral vascular disease.  5.  She also has a history of anxiety disorder.  6.  Chronic bronchitis.  7.  Degenerative joint disease and chronic pain.  8.  Carotid Doppler's in May of  2005 showed mild plaque.  9.  Status post TAB.  10. Status post cholecystectomy.  11. Status post laparotomy with adhesions in 2000.  12. Status post removal of a mass on her back which was benign.   SOCIAL HISTORY:  She resides in [place] with her husband. She is on  disability. Prior to that she worked  [employer]. She has one son deceased at age 80 with a myocardial infarction.  She has two remaining sons. She has seven grandchildren and one expected  great-grandchild. She smokes less than 10 cigarettes per  day and has been  doing at least this for 30 years. She denies any alcohol, drugs, herbal  medications. She states that she does not add salt to her diet. She does not  exercise.   FAMILY HISTORY:  Notable for the death of her mother in her 62s from bypass  complications. Her father died at 73 with a CVA. She has one brother alive.  Four sisters alive. One sister had bypass surgery at age 31.   REVIEW OF SYSTEMS:  Review of systems is notable for sinus congestion.  Glasses. Upper dentures. Chronic dyspnea on exertion which has not changed,  occasional shortness of breath. Claudication all over her legs although not  specifically relieved with rest. Chronic cough without production.  Postmenopausal. Anxiety, last attack is unknown. Back -  arthralgias. GERD,  constipation. Positive snoring.   PHYSICAL EXAMINATION:  GENERAL:  Well-nourished well-developed, pleasant  white female in no apparent distress.  VITAL SIGNS: Blood pressure is 114/60, pulse is 72 and regular, respirations  18, saturations 99% on 2.5 liters.  HEENT:  Unremarkable except for upper dentures.  NECK: Supple without thyromegaly, adenopathy, JVD or carotid bruits.  CHEST: Symmetrical excursion, diminished breath sounds but I did not hear  any  rales, rhonchi or wheezing.  HEART:  PMI is not displaced. Regular rate and rhythm. She does have a soft  2/3 systolic ejection murmur. Pulses are symmetrical and intact without  femoral bruits or abdominal bruits.  SKIN:  Integument was intact.  ABDOMEN:  Soft. Bowel sounds present without organomegaly, masses or  tenderness. EXTREMITIES:  Negative cyanosis, clubbing or edema. Peripheral  pulses are symmetrical and intact.  MUSCULOSKELETAL/NEURO:  Unremarkable.   CHEST X-RAY:  At [facility] showed no active disease. EKG showed normal  sinus rhythm, normal intervals with a rate of 81; no old EKGs are available for comparison. Some slight ST-segment depression inferolaterally.  H&H is  15.9 and 47.0, normal indices, platelets 340,000. WBC is 9.9 thousand.  Sodium 137, potassium 3.9, BUN 10, creatinine 0.8, glucose 110, PTT 31. PT  12.4. ER marker negative times one.   IMPRESSION:  Atypical chest discomfort, however, she has multiple cardiac  risk factors as per listed in her past medical history and abnormal EKG.  However, there is no old EKG for comparison.  History per past medical  history.   DISPOSITION:  Will admit her to rule out myocardial infarction. If her  second enzymes are negative we will discontinue her IV nitroglycerin, given  her headaches. Tobacco cessation was discussed. She also received  information in regards to hyperlipidemia and tobacco cessation. Watching the  videos was encouraged. I have called medical records for 1994 information in  regards to her peripheral vascular disease and prior cardiac  catheterization.  In the morning she will have an adenosine Myoview if her enzymes are  negative, to determine if this is a cardiac etiology for her atypical  discomfort. If her adenosine Cardiolite is normal for a low risk study then  she may be discharged home with outpatient follow-up and aggressive cardiac  risk factor modification.      Joellyn Rued, P.A. [facility]      Thomas C. Wall, M.D.  Electronically Signed    EW/MEDQ  D:  03/01/2005  T:  03/01/2005  Job:  [id number]   cc:   Milus Mallick. Lodema Hong, M.D.  Fax: [phone]   Crosby Bing, M.D. [facility]  [address]  [address]  [place]  [place] [address]

## 2010-08-10 NOTE — Procedures (Signed)
NAME:  Carla [name], Carla [name]                        ACCOUNT NO.:  [id number]   MEDICAL RECORD NO.:  [id number]                   PATIENT TYPE:  INP   LOCATION:  A207                                 FACILITY:  [facility]   PHYSICIAN:  Vida Roller, M.D.                DATE OF BIRTH:  Jun 27, 1945   DATE OF PROCEDURE:  DATE OF DISCHARGE:  02/16/2003                                    STRESS TEST   INDICATION:  The patient is a 65 year old female with nonobstructive  coronary artery disease by heart catheterization in 1994 who now presents  with complaints of chest discomfort.  She has had three sets of cardiac  enzymes which were negative for acute myocardial infarction.  She does have  continued cardiac risk factors including hyperlipidemia, hypertension and  tobacco abuse.  She also has a past medical history significant for  peripheral vascular disease, status post aortobifemoral bypass graft.   BASELINE DATA:  EKG revealed sinus rhythm at 89 beats per minute with poor R  wave progression, nonspecific ST abnormalities.  Blood pressure 148/88.  The  patient exercised for a total of 5 minutes, 11 seconds to Bruce protocol  stage 2 and 7.0 mets.  Maximum heart rate achieved was 154 beats per minute  which is 94% of predicted maximum.  The maximum blood pressure is 198/92  which recovered down to 128/78 in recovery.  The patient reported shortness  of breath during exercise.  She denied any chest discomfort.  The test was  stopped secondary to dyspnea and fatigue.   EKG revealed no arrhythmias and no ischemic changes.   Final images and results are pending M.D. review.     ________________________________________  ___________________________________________  Jae Dire, P.A. [facility]                      Vida Roller, M.D.   AB/MEDQ  D:  02/16/2003  T:  02/16/2003  Job:  [id number]

## 2010-08-10 NOTE — H&P (Signed)
   NAME:  Carla [name], Carla [name]                        ACCOUNT NO.:  [id number]   MEDICAL RECORD NO.:  [id number]                   PATIENT TYPE:  AMB   LOCATION:  DAY                                  FACILITY:  [facility]   PHYSICIAN:  Jerolyn Shin C. Katrinka Blazing, M.D.                DATE OF BIRTH:  December 10, 1945   DATE OF PROCEDURE:  DATE OF DISCHARGE:                      STAT - MUST CHANGE TO CORRECT WORK TYPE   HISTORY OF PRESENT ILLNESS:  Carla [name] is a 65 year old female with a 7  x 8 cm mass of the left upper back, which is gradually increased in size.  She has had some discomfort and ________.   PAST MEDICAL HISTORY:  1. She has chronic low back pain.  2. Anxiety disorder.  3. Nonobstructive coronary artery disease.  4. Hypertension.  5. Peripheral vascular disease.   PAST SURGICAL HISTORY:  1. Hysterectomy.  2. Bilateral vascular stents.  3. Exploratory laparotomy with adhesiolysis.  4. Cholecystectomy.  5. She is status post aortofemoral bypass graft.   MEDICATIONS:  1. Enalapril 10 mg q.d.  2. Hydrochlorothiazide 25 mg q.d.  3. Plavix 75 mg q.d.  4. Lortab 10/500 q.i.d.  5. Lipitor 60 mg q.d.   PHYSICAL EXAMINATION:  VITAL SIGNS:  Blood pressure 116/72, pulse 84,  respirations 18, weight 135 pounds.  HEENT:  Unremarkable.  NECK:  Supple without JVD or bruit.  CHEST:  Clear to auscultation.  HEART:  Regular rate and rhythm without murmur, gallop, or rub.  ABDOMEN:  Soft, nontender, no masses.  EXTREMITIES:  A 5 x 8 cm mass of left upper back over the shoulder.  NEUROLOGIC:  No focal, motor, sensory, or cerebellar deficit.   IMPRESSION:  1. Soft tissue mass, left upper back.  2. Hypertension.  3. Peripheral vascular disease.  4. Atherosclerotic heart disease.  5. Chronic low back pain.   PLAN:  Excision of mass of left upper back under anesthesia.                                                   Dirk Dress. Katrinka Blazing, M.D.    LCS/MEDQ  D:  11/09/2001  T:  11/09/2001  Job:   [id number]

## 2010-08-10 NOTE — Consult Note (Signed)
NAME:  Carla [name], Carla [name]                        ACCOUNT NO.:  [id number]   MEDICAL RECORD NO.:  [id number]                   PATIENT TYPE:  INP   LOCATION:  IC03                                 FACILITY:  [facility]   PHYSICIAN:  Vida Roller, M.D.                DATE OF BIRTH:  1945/11/06   DATE OF CONSULTATION:  02/15/2003  DATE OF DISCHARGE:                                   CONSULTATION   PRIMARY CARE Carla [name]:  Carla [name]. Carla [name], M.D. in [place], [place]  [place].   REFERRING PHYSICIAN:  [facility] at [facility].  She does  not have a cardiologist.   HISTORY OF PRESENT ILLNESS:  Carla [name] is a 65 year old woman with  peripheral vascular disease status post aortobifemoral bypass in 1994 who  presents to the emergency room yesterday complaining of chest discomfort  which had been waxing and waning since Friday.  She states that the onset of  the discomfort was at rest.  It is described as an achiness in the  substernal area.  It does not radiate.  It is not associated with shortness  of breath.  She has had no diaphoresis, or PND, or orthopnea.  She has no  radiation of the pain.  She is currently pain free and has been since she  was admitted from the ER.   PAST MEDICAL HISTORY:  Her past medical history is significant for  degenerative joint disease.  She has lumbar and cervical spine disease.  She  had excision of a mass from her back.  She has peripheral vascular disease,  status post an aortobifemoral graft in 1995 and a history of stents in her  femoral arteries, although there is no record of this. She had a  cholecystectomy and total abdominal hysterectomy.  She has chronic pain in  her back and legs.  She has anxiety, gastroesophageal reflux disease, and  hyperlipidemia, recently, aggressively treated on Lipitor and Zetia.   SOCIAL HISTORY:  She has ongoing tobacco abuse.  She lives in [place] with  her husband.  She has 3 grown children.  She has 7  grandchildren and she is  disabled.  She currently smokes and has about a 55-pack-year smoking  history.  No alcohol. No drugs.   FAMILY HISTORY:  Her mother died in her late 57s of complications from a  bypass surgery.  Her father died in his late 36s with a CVA.  She has 1  brother and 4 sisters several of whom have coronary disease.  Her children  and her grandchildren are healthy.   REVIEW OF SYSTEMS:  Her review of systems is essentially noncontributory,  aside from that mentioned in the history of present illness.   MEDICATIONS PRIOR TO ADMISSION:  1. Vasotec 10 mg a day.  2. Plavix 75 mg a day.  3. Hydrochlorothiazide 25 mg a day.  4. Lipitor 40 mg a day.  5.  Zetia 10 mg a day.  6. Xanax 0.5 mg 3 times a day.  7. Vicodin 10 and 650 three times to 4 times a day for pain.  8. She is also on Zelnorm.   MEDICATIONS IN THE HOSPITAL:  In the hospital she is on all those  medications with the exception of Zocor 80 mg as opposed to the Lipitor 40;  and she is also on a nicotine patch.   PHYSICAL EXAMINATION:  GENERAL:  On physical exam she is a well-developed,  well-nourished, white female who looks much older than her stated age.  VITAL SIGNS:  Her blood pressure is 133/75; heart rate is 86, respiratory  rate is 18 and she is afebrile.  HEENT:  Examination of the head, ears, eyes, nose, and throat is  unremarkable.  NECK:  The neck is supple. There is no jugular venous distention and no  carotid bruits are noted.  She has no lymphadenopathy.  CHEST:  Her chest has mild rhonchi throughout, but no rales.  CARDIAC:  Her cardiac exam is a regular rate and rhythm with no murmurs,  rubs, or gallops.  ABDOMEN:  Abdomen is soft, nontender, normoactive bowel sounds.  GENITOURINARY AND RECTAL:  Exams were deferred.  EXTREMITIES:  Her extremities were without clubbing, cyanosis, or edema with  1+ to 2+ pulses throughout her lower extremities.  NEUROLOGIC:  Neurologic exam is  nonfocal.   Chest x-ray shows mild changes consistent with COPD with no other acute  abnormalities.  Her electrocardiogram shows sinus rhythm at a rate of 77  with a normal axes, normal intervals.  She has poor R wave progression with  evidence of Q waves in the interseptal leads which are new from a previous  EKG done back in the early 1990s. She has no acute ST-T wave changes and  there is no left ventricular hypertrophy.  White blood cell count of 9.7,  H&H of 17 and 50, probably mildly heme concentrated with a platelet count of  344.  Sodium of 141, potassium of 4.1, chloride of 104, bicarbonate of 28,  BUN of 16 and creatinine 1.0 with a blood sugar of 99.  Her liver function  studies are all within normal limits.  Total protein is 8.1, albumin is 4.4.  Her 3 sets of cardiac enzymes are inconsistent with acute myocardial  infarction.  TSH is 1.0 for four.  INR, PT, and PTT are all normal.  Blood  gas shows a pH of 7.44 with pCO2 of 35, and pO2 of 87 on room air.   ASSESSMENT:  So essentially we have a woman with chest pain with multiple  cardiac risk factors and known lower extremity vascular disease.  Her  cardiac enzymes are negative and her electrocardiogram is mildly abnormal.  She has hyperlipidemia and she is an active smoker.   PLAN:  My plan is to get an echocardiogram to assess her left ventricular  function.  If that is normal, we will proceed with an exercise Cardiolite.  She is to stop smoking.  If the echocardiogram is abnormal, then we will  proceed with a heart catheterization.      ___________________________________________                                            Vida Roller, M.D.   JH/MEDQ  D:  02/15/2003  T:  02/15/2003  Job:  [id number]

## 2010-08-10 NOTE — Procedures (Signed)
NAME:  Carla [name], Carla [name]                        ACCOUNT NO.:  [id number]   MEDICAL RECORD NO.:  [id number]                   PATIENT TYPE:  INP   LOCATION:  A207                                 FACILITY:  [facility]   PHYSICIAN:  Vida Roller, M.D.                DATE OF BIRTH:  Dec 12, 1945   DATE OF PROCEDURE:  02/15/2003  DATE OF DISCHARGE:                                  ECHOCARDIOGRAM   TAPE NUMBER:  LB - [id number].   TAPE COUNT:  0 - 445.   INDICATIONS FOR PROCEDURE:  This is a 65 year old woman with known vascular  disease and chest discomfort. Last echocardiogram was done in 1999.   TECHNICAL QUALITY:  Adequate.   M-MODE TRACINGS:  The aorta is 33 mm.   The left atrium is 29 mm.   The septum is 11 mm.   Posterior wall is 11 mm.   The left ventricular diastolic dimension is 33 mm.   The left ventricular systolic dimension is 21 mm.   2-D AND DOPPLER IMAGING:  The left ventricle is normal size with super  normal left ventricular systolic function. There is no obvious wall motion  abnormality. Diastolic function was not assessed due to the hyperdynamic  left ventricular function.   The right ventricle is normal size with normal systolic function.   The atria both appear to be normal size.   The aortic valve is sclerotic with no evidence of stenosis. There is mild  insufficiency seen.   The mitral valve is morphologically unremarkable with trace insufficiency.  No stenosis is seen.   The tricuspid valve is morphologically unremarkable with trace  insufficiency. No stenosis is seen.   The pulmonic valve was not well seen.   The pericardial structures are normal.   The inferior vena cava is normal.   The ascending aorta was not well seen.      ___________________________________________                                            Vida Roller, M.D.   JH/MEDQ  D:  02/15/2003  T:  02/15/2003  Job:  [id number]

## 2010-08-10 NOTE — Op Note (Signed)
   NAME:  Carla [name], Carla [name]                        ACCOUNT NO.:  [id number]   MEDICAL RECORD NO.:  [id number]                   PATIENT TYPE:  AMB   LOCATION:  DAY                                  FACILITY:  [facility]   PHYSICIAN:  Jerolyn Shin C. Katrinka Blazing, M.D.                DATE OF BIRTH:  03-Aug-1945   DATE OF PROCEDURE:  DATE OF DISCHARGE:  11/10/2001                                 OPERATIVE REPORT   PREOPERATIVE DIAGNOSIS:  Mass left upper back.   POSTOPERATIVE DIAGNOSIS:  Mass left upper back.   PROCEDURE:  Wide excision of mass of left upper back, 8 cm x 6 cm x 2 cm.   SURGEON:  Dirk Dress. Katrinka Blazing, M.D.   INDICATIONS:  This is a 64 year old female with a history of a growing soft  tissue mass of her left upper back over her shoulder.   DESCRIPTION OF PROCEDURE:  Under general LMA anesthesia the patient was  turned in the right lateral decubitus position.  Transverse incision was  made over the mass.  The incision extended down into the deep subcutaneous  tissue.  The mass appeared to be adherent to the fascia.  Using  electrocautery the mass was totally circumscribed and excised down to the  fascia.  It was totally excised.  It measured 8 x 6 x 2 cm.  The large skin  flap was sutured down to the fascia with 2-0 Biosyn.  Subcutaneous tissue  was then closed with 3-0 Biosyn.  Skin was closed with subcuticular 4-0  Dexon.  The patient tolerated the procedure well.  A sterile dressing was  placed.  She was awakened from anesthesia uneventfully and transferred to a  bed and taken to the postanesthetic care unit.                                               Dirk Dress. Katrinka Blazing, M.D.    LCS/MEDQ  D:  11/10/2001  T:  11/12/2001  Job:  [id number]

## 2010-08-10 NOTE — Progress Notes (Addendum)
LVM- for pt to return call about medication she was to have began in March, zetia and welchol  Pt is not taking zetia or welchol is on crestor 40mg  daily, can not afford these medications Needs one that is cheap and effective.

## 2010-08-10 NOTE — Discharge Summary (Signed)
NAME:  Carla [name], Carla [name]                        ACCOUNT NO.:  [id number]   MEDICAL RECORD NO.:  [id number]                   PATIENT TYPE:  INP   LOCATION:  A207                                 FACILITY:  [facility]   PHYSICIAN:  Hanley Hays. [place], M.D.           DATE OF BIRTH:  22-Apr-1945   DATE OF ADMISSION:  02/14/2003  DATE OF DISCHARGE:  02/16/2003                                 DISCHARGE SUMMARY   DIAGNOSES:  1. Atypical chest pain.  2. Multiple cardiac risk factors including hypertension,     hypercholesterolemia, and tobacco abuse.  3. Gastroesophageal reflux.  4. Erythrocytosis with normal arterial blood gas on room air.  5. Peripheral vascular disease, status post aortofemoral-bifemoral graft and     bilateral femoral stents.  6. Status post cholecystectomy.  7. Status post abdominal hysterectomy in 1995 secondary to claudication.  8. Coronary artery disease though noted to be nonobstructive at that time,     catheterization November 1994.  9. Chronic obstructive pulmonary disease with ongoing tobacco abuse.  10.      Congenital right ureteropelvic junction obstruction.  11.      Anxiety disorder.  12.      Chronic neck and back pain.   DISPOSITION:  The patient is discharged to home.   FOLLOW-UP:  [name] as scheduled February 23, 2003.  Dr. Dorethea Clan to be  arranged.   MEDICATIONS:  1. Enalapril 10 mg daily.  2. Hydrochlorothiazide 25 mg daily.  3. Prilosec OTC 20 mg daily.  4. Xanax 0.5 three times daily.  5. Zetia 10 mg daily.  6. Lipitor 40 mg daily.  7. Enteric-coated aspirin 81 mg daily.  8. Plavix 75 mg daily.  9. Tylenol as needed for breakthrough pain.  10.      Hydrocodone/APAP 10/650 q.6h. p.r.n. pain.   HOSPITAL COURSE:  This is a 65 year old female with known peripheral  vascular disease who presented to the emergency room with a two-day history  of intermittent substernal chest burning which improved after Prilosec.  However, on the day of  admission she awakened and noted some substernal  heaviness and chronic aching-type pain which was different than her reflux.  Her EKG revealed some subtle changes with lateral ST though no evidence of  acute MI.  Her enzymes remained normal.  Echocardiogram revealed no wall  motion abnormalities or significant abnormality, with normal LV function and  actually some supernormal LV function according to the report though  diastolic function was not assessed due to the hyperdynamic LV.  In any  event, it was recommended by cardiology that she undergo inpatient  Cardiolite stress testing, which revealed the patient to be able to walk  only to Bruce stage II at 7 METS, stopped secondary to shortness of breath  though no chest pain.  She had no arrhythmias or ischemic changes.  Perfusion study revealed a normal LVEF and normal cardiac perfusion.  The  patient  was also noted to have some erythrocytosis at the time of admission  with a hemoglobin of 17.2 and hematocrit of 50.3.  Arterial blood gas  revealed a pO2 of 87.6.  It was felt that this could be followed up as an  outpatient and further workup as indicated by her primary M.D.  The patient  remained clinically stable.  Smoking cessation was discussed with the  patient at length, and recommendations were made for follow-up.  Again, this  will be deferred to the outpatient arena.  She is stable at the time of  discharge with a regimen as noted above.  She was instructed to return to  the emergency room should she have persistent chest discomfort or any other  associated signs.  She seemed to have a good understanding.     ___________________________________________                                         Hanley Hays. Josefine Class, M.D.   FED/MEDQ  D:  02/16/2003  T:  02/16/2003  Job:  [id number]   cc:   Milus Mallick. Lodema Hong, M.D.  [address]  [place], [place] [address]  Fax: [phone]

## 2010-08-16 NOTE — Progress Notes (Signed)
Sent bp diary to [facility] to be scanned into record at the beginning of Epic  No record of her bp record found now Please address any changes and route back to me Thank you

## 2010-08-17 ENCOUNTER — Other Ambulatory Visit: Payer: Self-pay | Admitting: Cardiology

## 2010-08-21 NOTE — Progress Notes (Signed)
08/21/10  Start over again.  Have patient collect home blood pressures and return to [place].  Lane Bing, M.D.

## 2010-10-29 ENCOUNTER — Other Ambulatory Visit: Payer: Self-pay | Admitting: Family Medicine

## 2010-11-14 ENCOUNTER — Encounter: Payer: Self-pay | Admitting: Family Medicine

## 2010-11-15 ENCOUNTER — Encounter: Payer: Self-pay | Admitting: Family Medicine

## 2010-11-15 ENCOUNTER — Ambulatory Visit (INDEPENDENT_AMBULATORY_CARE_PROVIDER_SITE_OTHER): Payer: BC Managed Care – PPO | Admitting: Family Medicine

## 2010-11-15 VITALS — BP 132/78 | HR 87 | Resp 16 | Ht 63.5 in | Wt 124.4 lb

## 2010-11-15 DIAGNOSIS — Z23 Encounter for immunization: Secondary | ICD-10-CM

## 2010-11-15 DIAGNOSIS — F17200 Nicotine dependence, unspecified, uncomplicated: Secondary | ICD-10-CM

## 2010-11-15 DIAGNOSIS — H6690 Otitis media, unspecified, unspecified ear: Secondary | ICD-10-CM

## 2010-11-15 DIAGNOSIS — H6692 Otitis media, unspecified, left ear: Secondary | ICD-10-CM | POA: Insufficient documentation

## 2010-11-15 DIAGNOSIS — I10 Essential (primary) hypertension: Secondary | ICD-10-CM

## 2010-11-15 DIAGNOSIS — Z2911 Encounter for prophylactic immunotherapy for respiratory syncytial virus (RSV): Secondary | ICD-10-CM

## 2010-11-15 DIAGNOSIS — F419 Anxiety disorder, unspecified: Secondary | ICD-10-CM

## 2010-11-15 DIAGNOSIS — F411 Generalized anxiety disorder: Secondary | ICD-10-CM

## 2010-11-15 DIAGNOSIS — E785 Hyperlipidemia, unspecified: Secondary | ICD-10-CM

## 2010-11-15 DIAGNOSIS — M549 Dorsalgia, unspecified: Secondary | ICD-10-CM

## 2010-11-15 DIAGNOSIS — R7301 Impaired fasting glucose: Secondary | ICD-10-CM

## 2010-11-15 LAB — HEPATIC FUNCTION PANEL
ALT: 15 U/L (ref 0–35)
AST: 23 U/L (ref 0–37)
Alkaline Phosphatase: 120 U/L — ABNORMAL HIGH (ref 39–117)
Bilirubin, Direct: 0.1 mg/dL (ref 0.0–0.3)
Indirect Bilirubin: 0.3 mg/dL (ref 0.0–0.9)

## 2010-11-15 LAB — LIPID PANEL
HDL: 54 mg/dL (ref >39)
LDL Cholesterol: 64 mg/dL (ref 0–99)
Total CHOL/HDL Ratio: 2.8 Ratio
VLDL: 35 mg/dL (ref 0–40)

## 2010-11-15 LAB — BASIC METABOLIC PANEL
BUN: 13 mg/dL (ref 6–23)
Creat: 0.91 mg/dL (ref 0.50–1.10)
Glucose, Bld: 85 mg/dL (ref 70–99)
Potassium: 4.8 mEq/L (ref 3.5–5.3)

## 2010-11-15 LAB — HEMOGLOBIN A1C: Hgb A1c MFr Bld: 6.3 % — ABNORMAL HIGH (ref <5.7)

## 2010-11-15 MED ORDER — ROSUVASTATIN CALCIUM 40 MG PO TABS: 40.0000 mg | ORAL_TABLET | Freq: Every day | ORAL | Status: DC

## 2010-11-15 MED ORDER — ALPRAZOLAM 0.5 MG PO TABS: 0.5000 mg | ORAL_TABLET | Freq: Four times a day (QID) | ORAL | Status: DC | PRN

## 2010-11-15 MED ORDER — HYDROCODONE-ACETAMINOPHEN 10-500 MG PO TABS: 1.0000 | ORAL_TABLET | Freq: Four times a day (QID) | ORAL | Status: DC | PRN

## 2010-11-15 MED ORDER — AMLODIPINE BESYLATE 5 MG PO TABS: 5.0000 mg | ORAL_TABLET | Freq: Every day | ORAL | Status: DC

## 2010-11-15 MED ORDER — PENICILLIN V POTASSIUM 500 MG PO TABS: 500.0000 mg | ORAL_TABLET | Freq: Three times a day (TID) | ORAL | Status: AC

## 2010-11-15 NOTE — Patient Instructions (Signed)
F/u in Decmeber, 4 months.  Mammogram and labs are past due.   You need to stop smoking

## 2010-11-18 NOTE — Assessment & Plan Note (Signed)
Cutting back, but unwilling to quit at this time

## 2010-11-18 NOTE — Assessment & Plan Note (Signed)
Controlled, no change in medication  

## 2010-11-18 NOTE — Progress Notes (Signed)
  Subjective:    Patient ID: Carla [name], female    DOB: Jan 22, 1946, 65 y.o.   MRN: [id number]  HPI    Review of Systems  Constitutional: Positive for fatigue. Negative for fever.  HENT: Positive for mouth sores.   Eyes: Negative.   Respiratory: Positive for cough and shortness of breath. Negative for wheezing.   Cardiovascular: Positive for orthopnea. Negative for chest pain, palpitations and leg swelling.  Gastrointestinal: Negative.   Genitourinary: Negative for dysuria and frequency.  Musculoskeletal: Positive for back pain.  Neurological: Negative for focal weakness.  Psychiatric/Behavioral: The patient is nervous/anxious.        Objective:   Physical Exam  Constitutional: She is oriented to person, place, and time. She appears well-nourished.  HENT:  Head: Normocephalic.  Right Ear: External ear normal.  Left Ear: External ear normal.  Mouth/Throat: Oropharynx is clear and moist. No oropharyngeal exudate.  Eyes: EOM are normal. Pupils are equal, round, and reactive to light.  Neck: Normal range of motion. No thyromegaly present.  Cardiovascular: Normal rate.   Pulmonary/Chest: Effort normal. She has no wheezes. She exhibits no tenderness.  Abdominal: Soft. There is no tenderness.  Musculoskeletal: Normal range of motion.  Lymphadenopathy:    She has no cervical adenopathy.  Neurological: She is alert and oriented to person, place, and time.  Skin: Skin is warm.          Assessment & Plan:  Mouth Lesions  The current episode started 5 to 7 days ago. The problem occurs occasionally. The problem has been unchanged. The problem is moderate. The symptoms are relieved by nothing. The symptoms are aggravated by nothing. Associated symptoms include orthopnea, mouth sores and cough. Pertinent negatives include no fever, no muscle aches, no URI and no wheezing.  Hyperlipidemia This is a chronic problem. The problem is controlled. Recent lipid tests were reviewed and  are normal. Associated symptoms include shortness of breath. Pertinent negatives include no chest pain or focal weakness. The current treatment provides significant improvement of lipids. Risk factors for coronary artery disease include hypertension and a sedentary lifestyle.  Hypertension Associated symptoms include orthopnea and shortness of breath. Pertinent negatives include no chest pain or palpitations. There are no associated agents to hypertension. Risk factors for coronary artery disease include dyslipidemia and smoking/tobacco exposure. The current treatment provides significant improvement. There are no compliance problems.

## 2010-11-18 NOTE — Assessment & Plan Note (Signed)
Unchanged, continue meds 

## 2010-11-29 ENCOUNTER — Encounter: Payer: Self-pay | Admitting: Family Medicine

## 2010-11-29 ENCOUNTER — Ambulatory Visit (INDEPENDENT_AMBULATORY_CARE_PROVIDER_SITE_OTHER): Payer: BC Managed Care – PPO | Admitting: Family Medicine

## 2010-11-29 VITALS — BP 138/80 | HR 88 | Resp 22 | Ht 63.5 in | Wt 124.4 lb

## 2010-11-29 DIAGNOSIS — R079 Chest pain, unspecified: Secondary | ICD-10-CM | POA: Insufficient documentation

## 2010-11-29 DIAGNOSIS — F411 Generalized anxiety disorder: Secondary | ICD-10-CM

## 2010-11-29 DIAGNOSIS — I10 Essential (primary) hypertension: Secondary | ICD-10-CM

## 2010-11-29 DIAGNOSIS — F17200 Nicotine dependence, unspecified, uncomplicated: Secondary | ICD-10-CM

## 2010-11-29 DIAGNOSIS — E785 Hyperlipidemia, unspecified: Secondary | ICD-10-CM

## 2010-11-29 DIAGNOSIS — R5383 Other fatigue: Secondary | ICD-10-CM

## 2010-11-29 NOTE — Patient Instructions (Addendum)
F/u as before.  You need a blood test today, and you will be referred to cardiology as soon as possible for further evauation of your symptoms.  YOU need to stop smoking.  If you experience worsening symptoms of fatigue  With activity or chest discomfort go directly to the ED

## 2010-11-29 NOTE — Progress Notes (Signed)
  Subjective:    Patient ID: Carla [name], female    DOB: 09-21-1945, 65 y.o.   MRN: [id number]  HPI 1 week h/o increased chest discomfort, and shortness of breath with minimal activity. Pt "feels scared" and has stopped taking her xanax, and reports feeling increasingly anxious also. She denies PND, orthopnea or leg swelling. She continues to smoke uith no quit date in mind.   Review of Systems See HPI Denies recent fever or chills. Denies sinus pressure, nasal congestion, ear pain or sore throat. Denies chest congestion, productive cough, but has a chronic smoker's cough, no  wheezing.  Denies abdominal pain, nausea, vomiting,diarrhea or constipation.   Denies dysuria, frequency, hesitancy or incontinence. Chronic back pain Denies headaches, seizures, numbness, or tinglin Chronic  Depression and  anxiety  Have worsened, still refuses antidepressant, but states she knows her anxiety is out of control. Already on the maximum amt of xanax I will prescribe. Not suicidal or homicidal. Denies skin break down or rash.        Objective:   Physical Exam Patient alert and oriented and in no cardiopulmonary distress.Extremely anxious  HEENT: No facial asymmetry, EOMI, no sinus tenderness,  oropharynx pink and moist.  Neck supple no adenopathy.  Chest: Clear to auscultation bilaterally.Decreased air entry throughout  CVS: S1, S2 no murmurs, no S3.  ABD: Soft non tender. Bowel sounds normal.  Ext: No edema  MS: Adequate ROM spine, shoulders, hips and knees.  Skin: Intact, no ulcerations or rash noted.  Psych: Good eye contact, . Memory intact  anxious   CNS: CN 2-12 intact, power, tone and sensation normal throughout.        Assessment & Plan:

## 2010-11-30 ENCOUNTER — Encounter (HOSPITAL_COMMUNITY): Payer: Self-pay | Admitting: Emergency Medicine

## 2010-11-30 ENCOUNTER — Telehealth: Payer: Self-pay

## 2010-11-30 ENCOUNTER — Emergency Department (HOSPITAL_COMMUNITY)
Admission: EM | Admit: 2010-11-30 | Discharge: 2010-11-30 | Disposition: A | Discharge status: Home / Self Care | Payer: BC Managed Care – PPO | Attending: Emergency Medicine | Admitting: Emergency Medicine

## 2010-11-30 ENCOUNTER — Other Ambulatory Visit: Payer: Self-pay

## 2010-11-30 ENCOUNTER — Emergency Department (HOSPITAL_COMMUNITY): Payer: BC Managed Care – PPO

## 2010-11-30 DIAGNOSIS — F17200 Nicotine dependence, unspecified, uncomplicated: Secondary | ICD-10-CM | POA: Insufficient documentation

## 2010-11-30 DIAGNOSIS — J40 Bronchitis, not specified as acute or chronic: Secondary | ICD-10-CM | POA: Insufficient documentation

## 2010-11-30 DIAGNOSIS — Z7982 Long term (current) use of aspirin: Secondary | ICD-10-CM | POA: Insufficient documentation

## 2010-11-30 DIAGNOSIS — I2510 Atherosclerotic heart disease of native coronary artery without angina pectoris: Secondary | ICD-10-CM | POA: Insufficient documentation

## 2010-11-30 DIAGNOSIS — R0602 Shortness of breath: Secondary | ICD-10-CM | POA: Insufficient documentation

## 2010-11-30 NOTE — Telephone Encounter (Signed)
Patient wants to know if you can change her nerve pill. She is still very anxious and breathing heavily and her breathing is making her very tired. Wants to know if something can be sent into [place]. Advised if she felt like she needed to be seen before Monday to go to the urgent care or ER

## 2010-11-30 NOTE — ED Notes (Signed)
Pt reports sob x 1 week. Worsens upon exertion. Seen by PCP yesterday. Was told if condtion worsened to go to ER.

## 2010-11-30 NOTE — ED Notes (Signed)
Pt complain of non specific complaint of sob. States she was a her pcp yesterday for same. States her pcp told her to come here if she started having chest pain. Pt denies chest pain but states she was sob earlier.

## 2010-11-30 NOTE — ED Notes (Signed)
Waiting to be reeval and disposition 

## 2010-11-30 NOTE — ED Provider Notes (Signed)
History     CSN: [id number] Arrival date & time: 11/30/2010  4:32 PM Pt seen at 1700 Chief Complaint  Patient presents with  . Shortness of Breath   HPI Comments: Pt reports for past month has had generalized fatigue No vomiting/diarrhea No rectal/vag bleeding No significant wt gain or wt loss No falls No CP No abd pain No focal weakness  Reports that for past week has felt SOB but "tired when I breathe"  Reports some worsening with walking and lying supine and with talking.   Activity level is unchanged this past week No diaphoresis Seen by PCP, but no improvement Took xanax today and that seemed to ease her symptoms Distant h/o CAD No h/o Pe/DVT Not on estrogen  Patient is a 65 y.o. female presenting with shortness of breath. The history is provided by the patient.  Shortness of Breath  The current episode started 5 to 7 days ago. The onset was gradual. The problem occurs rarely. The problem has been gradually improving. The problem is mild. The symptoms are relieved by nothing. The symptoms are aggravated by activity and a supine position (talking). Associated symptoms include orthopnea, cough and shortness of breath. Pertinent negatives include no chest pain, no chest pressure and no wheezing.    Past Medical History  Diagnosis Date  . ASCVD (arteriosclerotic cardiovascular disease)     No critical disease on cath in 8/97: mild AI with trival, if any l AS; negative stress nuclear in 2010  . Peripheral vascular disease     stauts post aortabifemoral graft    . Tobacco abuse 2010    Continue at 1/2 pack per day   . Chronic bronchitis   . Hypertension   . Fasting hyperglycemia   . Elevated hemoglobin A1c   . Hydronephrosis   . GERD (gastroesophageal reflux disease)   . Back pain   . Depression   . Osteoporosis     Past Surgical History  Procedure Date  . Cholecystectomy   . Aorta bifem graft   . Right kidney surgery following damage during arterial surgery   .  Partial hysterectomy   . Total abdominal hysterectomy w/ bilateral salpingoophorectomy 1975  . Lysis of adhesions 2000    Family History  Problem Relation Age of Onset  . Heart attack Mother   . Heart attack Father   . COPD Sister   . Diabetes Sister   . Diabetes Sister   . Coronary artery disease Sister   . Diabetes Sister   . Heart disease Sister   . COPD Sister     History  Substance Use Topics  . Smoking status: Current Everyday Smoker -- 0.5 packs/day    Types: Cigarettes  . Smokeless tobacco: Not on file  . Alcohol Use: No    OB History    Grav Para Term Preterm Abortions TAB SAB Ect Mult Living                  Review of Systems  Respiratory: Positive for cough and shortness of breath. Negative for wheezing.   Cardiovascular: Positive for orthopnea. Negative for chest pain.  All other systems reviewed and are negative.    Physical Exam  BP 133/81  Pulse 89  Temp(Src) 97.7 F (36.5 C) (Oral)  Resp 24  Ht 5\' 3"  (1.6 m)  Wt 124 lb (56.246 kg)  BMI 21.97 kg/m2  SpO2 100%  Physical Exam  CONSTITUTIONAL: Well developed/well nourished HEAD AND FACE: Normocephalic/atraumatic EYES: EOMI/PERRL, conjunctiva pink  ENMT: Mucous membranes moist NECK: supple no meningeal signs CV: S1/S2 noted, no murmurs/rubs/gallops noted LUNGS: Lungs are clear to auscultation bilaterally, no apparent distress Pt is able to speak to me clearly without any issue ABDOMEN: soft, nontender, no rebound or guarding NEURO: Pt is awake/alert, moves all extremitiesx4, gait normal EXTREMITIES: pulses normal, full ROM SKIN: warm, color normal PSYCH: no abnormalities of mood noted   ED Course  Procedures  MDM Previous records reviewed and considered Nursing notes reviewed and considered in documentation xrays reviewed and considered    Date: 11/30/2010  Rate: 72  Rhythm: normal sinus rhythm  QRS Axis: normal  Intervals: normal  ST/T Wave abnormalities: nonspecific ST  changes  Conduction Disutrbances:nonspecific intraventricular conduction delay  Narrative Interpretation:   Old EKG Reviewed: none available    Pt well appearing, walked around ED in no distress She had no dyspnea on exertion here in the ED My suspicion for ACS/PE/CHF is very low at this time.   She has outpatient followup arranged    Joya Gaskins, MD 11/30/10 2209

## 2010-11-30 NOTE — ED Notes (Signed)
Ambulated pt around the nurses desk at this time pt was able to keep a conversation going without any sob noted. Sats were above 95% the entire time. Carla [name]

## 2010-12-01 NOTE — Telephone Encounter (Signed)
IFor her anxiety until she sees mental health i suggest she add paxil 10mg  one daily if she agrees pls erx #30 refill 1 only, she can see me in f/u 6 weeks after starting the medication

## 2010-12-01 NOTE — Telephone Encounter (Signed)
She needs to go to mental health for any change in her nerve pill. She needs to see cardiology about her increased fatigue or go to the ED if she feels worse. An appt with me on Monday will not be useful

## 2010-12-04 MED ORDER — PAROXETINE HCL 10 MG PO TABS: 10.0000 mg | ORAL_TABLET | ORAL | Status: DC

## 2010-12-04 NOTE — Telephone Encounter (Signed)
Patient aware.

## 2010-12-07 ENCOUNTER — Encounter: Payer: Self-pay | Admitting: Family Medicine

## 2010-12-16 NOTE — Assessment & Plan Note (Signed)
Unchanged and unwilling to quit

## 2010-12-16 NOTE — Assessment & Plan Note (Signed)
Repeat lab data past due ,low fat diet stressed

## 2010-12-16 NOTE — Assessment & Plan Note (Signed)
Increased and uncontrolled, hopefully pt will actually take the paxil now prescribed to assist with symptoms

## 2010-12-16 NOTE — Assessment & Plan Note (Signed)
Controlled, no change in medication  

## 2010-12-16 NOTE — Assessment & Plan Note (Signed)
C/o recent chest pain with symptoms concerning for CAD, office EKG chows no acute ischemia, however, based on risk profile and new complaint, will refer for cardiology eval

## 2010-12-18 ENCOUNTER — Ambulatory Visit (INDEPENDENT_AMBULATORY_CARE_PROVIDER_SITE_OTHER): Payer: BC Managed Care – PPO | Admitting: Cardiology

## 2010-12-18 ENCOUNTER — Encounter: Payer: Self-pay | Admitting: Cardiology

## 2010-12-18 DIAGNOSIS — R0989 Other specified symptoms and signs involving the circulatory and respiratory systems: Secondary | ICD-10-CM

## 2010-12-18 DIAGNOSIS — I2510 Atherosclerotic heart disease of native coronary artery without angina pectoris: Secondary | ICD-10-CM

## 2010-12-18 DIAGNOSIS — R0609 Other forms of dyspnea: Secondary | ICD-10-CM

## 2010-12-18 DIAGNOSIS — Z7901 Long term (current) use of anticoagulants: Secondary | ICD-10-CM

## 2010-12-18 DIAGNOSIS — F329 Major depressive disorder, single episode, unspecified: Secondary | ICD-10-CM

## 2010-12-18 DIAGNOSIS — R0602 Shortness of breath: Secondary | ICD-10-CM

## 2010-12-18 DIAGNOSIS — F419 Anxiety disorder, unspecified: Secondary | ICD-10-CM

## 2010-12-18 DIAGNOSIS — F341 Dysthymic disorder: Secondary | ICD-10-CM

## 2010-12-18 DIAGNOSIS — E210 Primary hyperparathyroidism: Secondary | ICD-10-CM

## 2010-12-18 NOTE — Assessment & Plan Note (Signed)
Patient presents with dyspnea that is not exclusively related to exertion and not classic for cardiopulmonary disease.  Nonetheless, she has known vascular disease and a remote history of noncritical coronary disease and certainly is a candidate to develop significant heart or lung disease.  To exclude those possibilities, a stress echocardiogram and PFTs will be performed.  Laboratory studies including a CBC, TSH level, d-dimer and BNP level will be obtained.  While her current symptoms could reflect anxiety and/or depression, those conditions are chronic, but her current problems are acute.  This testing proves unrevealing, referral to a psychologist or psychiatrist may be desirable.

## 2010-12-18 NOTE — Patient Instructions (Signed)
Your physician has requested that you have a stress echocardiogram. For further information please visit [email or web address] Please follow instruction sheet as given.  Your physician recommends that you return for lab work in: today  Your physician has recommended that you have a pulmonary function test. Pulmonary Function Tests are a group of tests that measure how well air moves in and out of your lungs.  Your physician recommends that you schedule a follow-up appointment in: after test

## 2010-12-18 NOTE — Progress Notes (Signed)
HPI : Carla [name] is seen at the request of [name] for evaluation of dyspnea and fatigue.  This nice woman has a history of insignificant coronary disease and chronic obstructive pulmonary disease caused by tobacco abuse which continues.  Over the past month, she has noticed episodes of tremulousness that improved with benzodiazepines.  She has been somewhat anxious, but not dramatically more than in the past.  She has a lot of stresses, but also not dramatically increased.  She reports chronic stable depression with some sadness, occasional tearfulness but no suicidal ideation.  She has no history of major phobias.  She has never been treated by a psychiatrist or psychologist.  She also describes breathing difficulties, which are not entirely clear.  She notes dyspnea with activity, but only certain kinds of effort.  She has had excessive fatigue.  He has not been sleeping well, but benzodiazepines help.  She is recently started Paxil without any change in her symptoms.  0.5 mg of alprazolam provides relief for approximately 2 hours.  She has had no chest discomfort, orthopnea or PND.  She notes no change in weight.  She has had no pedal edema.  Current Outpatient Prescriptions on File Prior to Visit  Medication Sig Dispense Refill  . ALPRAZolam (XANAX) 0.5 MG tablet Take 1 tablet (0.5 mg total) by mouth 4 (four) times daily as needed for sleep.  120 tablet  3  . amLODipine (NORVASC) 5 MG tablet Take 5 mg by mouth at bedtime.        Marland Kitchen aspirin (ASPIRIN LOW DOSE) 81 MG EC tablet Take 81 mg by mouth daily. Take one tablet by mouth once daily       . HYDROcodone-acetaminophen (LORTAB 10) 10-500 MG per tablet Take 1 tablet by mouth 4 (four) times daily as needed for pain.  120 tablet  3  . PARoxetine (PAXIL) 10 MG tablet Take 1 tablet (10 mg total) by mouth every morning.  30 tablet  1  . rosuvastatin (CRESTOR) 40 MG tablet Take 40 mg by mouth at bedtime.           No Known Allergies    Past  medical history, social history, and family history reviewed and updated.  ROS: See history of present illness.  PHYSICAL EXAM: BP 130/67  Pulse 78  Resp 18  Ht 5\' 2"  (1.575 m)  Wt 125 lb (56.7 kg)  BMI 22.86 kg/m2  General-Well developed; no acute distress Body habitus-proportionate weight and height Neck-No JVD; no carotid bruits Lungs-clear lung fields; resonant to percussion Cardiovascular-normal PMI; normal S1 and S2; minimal systolic ejection murmur; + S4 Abdomen-normal bowel sounds; soft and non-tender without masses or organomegaly Musculoskeletal-No deformities, no cyanosis or clubbing Neurologic-Normal cranial nerves; symmetric strength and tone Skin-Warm, no significant lesions Extremities-1-2+ distal pulses; no edema  ASSESSMENT AND PLAN:

## 2010-12-19 LAB — CBC WITH DIFFERENTIAL/PLATELET
Basophils Absolute: 0 10*3/uL (ref 0.0–0.1)
Eosinophils Relative: 1 % (ref 0–5)
HCT: 50.6 % — ABNORMAL HIGH (ref 36.0–46.0)
Lymphocytes Relative: 42 % (ref 12–46)
MCH: 32.1 pg (ref 26.0–34.0)
MCV: 97.3 fL (ref 78.0–100.0)
Monocytes Absolute: 0.5 10*3/uL (ref 0.1–1.0)
RDW: 15.5 % (ref 11.5–15.5)
WBC: 10.3 10*3/uL (ref 4.0–10.5)

## 2010-12-19 LAB — D-DIMER, QUANTITATIVE: D-Dimer, Quant: 1.01 ug/mL-FEU — ABNORMAL HIGH (ref 0.00–0.48)

## 2010-12-25 ENCOUNTER — Encounter: Payer: Self-pay | Admitting: *Deleted

## 2011-01-02 ENCOUNTER — Encounter (HOSPITAL_COMMUNITY): Payer: Self-pay | Admitting: Cardiology

## 2011-01-02 ENCOUNTER — Ambulatory Visit (HOSPITAL_COMMUNITY)
Admission: RE | Admit: 2011-01-02 | Discharge: 2011-01-02 | Disposition: A | Discharge status: Home / Self Care | Payer: BC Managed Care – PPO | Source: Ambulatory Visit | Attending: Cardiology | Admitting: Cardiology

## 2011-01-02 DIAGNOSIS — R0609 Other forms of dyspnea: Secondary | ICD-10-CM

## 2011-01-02 DIAGNOSIS — R0989 Other specified symptoms and signs involving the circulatory and respiratory systems: Secondary | ICD-10-CM | POA: Insufficient documentation

## 2011-01-02 DIAGNOSIS — R0602 Shortness of breath: Secondary | ICD-10-CM

## 2011-01-02 DIAGNOSIS — J449 Chronic obstructive pulmonary disease, unspecified: Secondary | ICD-10-CM | POA: Insufficient documentation

## 2011-01-02 DIAGNOSIS — J4489 Other specified chronic obstructive pulmonary disease: Secondary | ICD-10-CM | POA: Insufficient documentation

## 2011-01-02 DIAGNOSIS — I2510 Atherosclerotic heart disease of native coronary artery without angina pectoris: Secondary | ICD-10-CM

## 2011-01-02 LAB — BLOOD GAS, ARTERIAL
Drawn by: 22874
FIO2: 0.21 %
TCO2: 21.1 mmol/L (ref 0–100)
pCO2 arterial: 38.3 mmHg (ref 35.0–45.0)
pH, Arterial: 7.422 — ABNORMAL HIGH (ref 7.350–7.400)

## 2011-01-02 LAB — PULMONARY FUNCTION TEST

## 2011-01-02 MED ORDER — ALBUTEROL SULFATE (5 MG/ML) 0.5% IN NEBU
2.5000 mg | INHALATION_SOLUTION | Freq: Once | RESPIRATORY_TRACT | Status: AC
  Administered 2011-01-02: 2.5 mg via RESPIRATORY_TRACT

## 2011-01-02 NOTE — Progress Notes (Addendum)
Stress Lab Nurses Notes - Carla [name]  Carla [name] 01/02/2011  Reason for doing test: Dyspnea and Fatigue  Type of test: Stress Echo  Nurse performing test: Marlena Clipper, RN  Nuclear Medicine Tech: Not Applicable  Echo Tech: Karrie Doffing  MD performing test: R. Dietrich Pates  Family MD: [name]  Test explained and consent signed: yes  IV started: No IV started  Symptoms: Sob  And fatigue  Treatment/Intervention: None  Reason test stopped: fatigue and SOB  After recovery IV was: no Iv started  Patient to return to Nuc. Med at :  Patient discharged: Home  Patient's Condition upon discharge was: stable  Comments: O2 sat to start was 95% and lowest was 94%. Rest BP130/60 HR85 and peak BP 140/88 and hr116.   Marlena Clipper L  Please see full report of Stress Echocardiogram.

## 2011-01-02 NOTE — Progress Notes (Signed)
*  PRELIMINARY RESULTS* Echocardiogram Echocardiogram Stress Test has been performed.  Carla [name] 01/02/2011, 10:45 AM

## 2011-01-03 NOTE — Procedures (Signed)
NAME:  GODDESS, GEBBIA              ACCOUNT NO.:  [id number]  MEDICAL RECORD NO.:  [id number]  LOCATION:  CARDIOPU                      FACILITY:  [facility]  PHYSICIAN:  Jovin Fester L. Juanetta Gosling, M.D.DATE OF BIRTH:  Sep 01, 1945  DATE OF PROCEDURE: DATE OF DISCHARGE:  01/02/2011                           PULMONARY FUNCTION TEST   Reason for pulmonary function testing is shortness of breath. 1. Spirometry shows a mild-to-moderate ventilatory defect with     evidence of airflow obstruction.  2.  Lung volumes show no evidence     of restrictive change but do show air trapping. 2. DLCO is moderately reduced and corrects somewhat for volume. 3. Arterial blood gas is normal. 4. There is no significant bronchodilator improvement. 5. This is consistent with COPD.     Uriyah Massimo L. Juanetta Gosling, M.D.     ELH/MEDQ  D:  01/03/2011  T:  01/03/2011  Job:  [id number]  cc:   Gerrit Friends. Dietrich Pates, MD, [facility] [address] [place], [place] [address]

## 2011-01-04 ENCOUNTER — Telehealth: Payer: Self-pay | Admitting: *Deleted

## 2011-01-04 NOTE — Telephone Encounter (Signed)
Advised patient of normal ECHO result

## 2011-01-07 ENCOUNTER — Telehealth: Payer: Self-pay | Admitting: *Deleted

## 2011-01-07 NOTE — Telephone Encounter (Signed)
Message copied by Gaynelle Adu on Mon Jan 07, 2011  4:14 PM ------      Message from: Kathlen Brunswick      Created: Sat Jan 05, 2011  8:00 PM       Diagnostic testing reviewed; Normal or stable results.      No change in medical therapy.

## 2011-01-09 ENCOUNTER — Telehealth: Payer: Self-pay | Admitting: Family Medicine

## 2011-01-10 NOTE — Telephone Encounter (Signed)
There is no cough med on her list. Needs OV to address concerns if med is needed

## 2011-01-10 NOTE — Telephone Encounter (Signed)
Will try some otc medicine

## 2011-01-11 ENCOUNTER — Encounter: Payer: Self-pay | Admitting: Cardiology

## 2011-01-11 ENCOUNTER — Encounter (HOSPITAL_COMMUNITY): Payer: Self-pay | Admitting: Cardiology

## 2011-01-14 ENCOUNTER — Telehealth: Payer: Self-pay | Admitting: *Deleted

## 2011-01-14 NOTE — Telephone Encounter (Signed)
Message copied by Gaynelle Adu on Mon Jan 14, 2011  8:52 AM ------      Message from: Kathlen Brunswick      Created: Sun Jan 13, 2011  2:06 PM       Diagnostic testing reviewed; Mildly to moderately abnormal.      No change in medical therapy.

## 2011-01-14 NOTE — Telephone Encounter (Signed)
Pt called with normal PFT results

## 2011-01-17 ENCOUNTER — Encounter: Payer: Self-pay | Admitting: *Deleted

## 2011-01-17 ENCOUNTER — Encounter: Payer: Self-pay | Admitting: Cardiology

## 2011-01-17 ENCOUNTER — Ambulatory Visit (INDEPENDENT_AMBULATORY_CARE_PROVIDER_SITE_OTHER): Payer: BC Managed Care – PPO | Admitting: Cardiology

## 2011-01-17 DIAGNOSIS — M810 Age-related osteoporosis without current pathological fracture: Secondary | ICD-10-CM

## 2011-01-17 DIAGNOSIS — M549 Dorsalgia, unspecified: Secondary | ICD-10-CM

## 2011-01-17 DIAGNOSIS — E210 Primary hyperparathyroidism: Secondary | ICD-10-CM

## 2011-01-17 DIAGNOSIS — E785 Hyperlipidemia, unspecified: Secondary | ICD-10-CM

## 2011-01-17 DIAGNOSIS — F341 Dysthymic disorder: Secondary | ICD-10-CM

## 2011-01-17 DIAGNOSIS — I739 Peripheral vascular disease, unspecified: Secondary | ICD-10-CM

## 2011-01-17 DIAGNOSIS — I10 Essential (primary) hypertension: Secondary | ICD-10-CM

## 2011-01-17 DIAGNOSIS — J301 Allergic rhinitis due to pollen: Secondary | ICD-10-CM

## 2011-01-17 DIAGNOSIS — J449 Chronic obstructive pulmonary disease, unspecified: Secondary | ICD-10-CM

## 2011-01-17 DIAGNOSIS — I2510 Atherosclerotic heart disease of native coronary artery without angina pectoris: Secondary | ICD-10-CM

## 2011-01-17 DIAGNOSIS — F419 Anxiety disorder, unspecified: Secondary | ICD-10-CM

## 2011-01-17 MED ORDER — IPRATROPIUM-ALBUTEROL 18-103 MCG/ACT IN AERO: 2.0000 | INHALATION_SPRAY | Freq: Three times a day (TID) | RESPIRATORY_TRACT | Status: DC

## 2011-01-17 MED ORDER — PAROXETINE HCL 20 MG PO TABS: 20.0000 mg | ORAL_TABLET | ORAL | Status: DC

## 2011-01-17 MED ORDER — NICOTINE 21 MG/24HR TD PT24: 1.0000 | MEDICATED_PATCH | TRANSDERMAL | Status: AC

## 2011-01-17 NOTE — Assessment & Plan Note (Signed)
Patient has never seriously considered discontinuing tobacco use.  I suggested that she obtain nicotine patches and start immediately and have provided her with a prescription for Chantix in case nicotine replacement therapy is ineffective.  I stressed the importance of tobacco cessation for her long-term health and functional status.

## 2011-01-17 NOTE — Progress Notes (Signed)
HPI : Ms. Fuhriman returns to the office as scheduled for reevaluation of symptoms including dyspnea and fatigue and discussion of her evaluation to date.  She reports some improvement in her sense of well-being, but has continuing malaise and fatigue.  She believes her low dose of Paxil has somewhat improved her mood.  She believes that she maintains a high level of activity including doing house and yard work.  She denies edema, orthopnea, PND and weight gain.  Current Outpatient Prescriptions on File Prior to Visit  Medication Sig Dispense Refill  . ALPRAZolam (XANAX) 0.5 MG tablet Take 1 tablet (0.5 mg total) by mouth 4 (four) times daily as needed for sleep.  120 tablet  3  . amLODipine (NORVASC) 5 MG tablet Take 5 mg by mouth at bedtime.        Marland Kitchen aspirin (ASPIRIN LOW DOSE) 81 MG EC tablet Take 81 mg by mouth daily. Take one tablet by mouth once daily       . HYDROcodone-acetaminophen (LORTAB 10) 10-500 MG per tablet Take 1 tablet by mouth 4 (four) times daily as needed for pain.  120 tablet  3  . PARoxetine (PAXIL) 10 MG tablet Take 1 tablet (10 mg total) by mouth every morning.  30 tablet  1  . rosuvastatin (CRESTOR) 40 MG tablet Take 40 mg by mouth at bedtime.           No Known Allergies    Past medical history, social history, and family history reviewed and updated.  ROS: See history of present illness  PHYSICAL EXAM: BP 120/62  Pulse 80  Resp 16  Ht 5\' 2"  (1.575 m)  Wt 127 lb (57.607 kg)  BMI 23.23 kg/m2  General-Well developed; no acute distress; raspy voice Body habitus-thin Neck-No JVD; no carotid bruits Lungs-expiratory rhonchi, clear with cough; resonant to percussion; prolonged expiratory phase Cardiovascular-normal PMI; normal S1 and S2 Abdomen-normal bowel sounds; soft and non-tender without masses or organomegaly Musculoskeletal-No deformities, no cyanosis or clubbing Neurologic-Normal cranial nerves; symmetric strength and tone Skin-Warm, no significant  lesions Extremities-distal pulses intact; no edema  ASSESSMENT AND PLAN:

## 2011-01-17 NOTE — Patient Instructions (Signed)
Your physician has recommended you make the following change in your medication:   1- Nicotine Patches 21mg  daily (if this is unsuccessful, call the office and we will start Chantix instead.)  STOP smoking 2 - Combivent inhaler 2 puffs 3 times daily 3 - Increase Paxil to 20mg  daily  Your physician recommends that you schedule a follow-up appointment in: 9 months with Dr Dietrich Pates.  You will receive a letter in the mail.

## 2011-01-17 NOTE — Assessment & Plan Note (Signed)
PFTs show mild to moderate obstructive disease and a normal room air arterial blood gas consistent with predominant emphysema.  A Combivent inhaler was added to her medical regime although her response to bronchodilators was not impressive.  She might benefit from concurrent care by a pulmonologist.  [name] will determine if this is warranted.

## 2011-01-17 NOTE — Assessment & Plan Note (Signed)
Stress echocardiogram was negative for ischemia, but demonstrated very poor exercise tolerance.  Despite the patient's protestations, I suggested that she is physically deconditioned and should be walking on a regular basis.

## 2011-01-17 NOTE — Assessment & Plan Note (Signed)
Patient has improved during treatment with low-dose Paxil.  I have recommended that she increase to 20 mg per day pending reevaluation by [name] in a few weeks.

## 2011-01-17 NOTE — Assessment & Plan Note (Signed)
Lipid profile in 10/2010:153, 177, 54, 64.  Response to simvastatin is excellent.

## 2011-01-17 NOTE — Assessment & Plan Note (Signed)
Distal pulses are intact.  She does not appear to have significant impairment of flow to the lower extremities.

## 2011-01-17 NOTE — Assessment & Plan Note (Signed)
Blood pressure control is excellent; current medications will be continued. 

## 2011-02-19 ENCOUNTER — Telehealth: Payer: Self-pay | Admitting: Cardiology

## 2011-02-19 NOTE — Telephone Encounter (Signed)
Patient wants to go back on Paroxetine 10 mg, as previously ordered by Dr Lodema Hong, due to nausea.  Advised her to continue to follow up with Dr Lodema Hong regarding management of this medication in the future.  Pt verbalized understanding.

## 2011-02-19 NOTE — Telephone Encounter (Signed)
Patient wants to discuss dosage for Paroxetine 20mg .  States that is causing her to be nauseated.  Please return call. / tg

## 2011-02-28 ENCOUNTER — Encounter: Payer: Self-pay | Admitting: Family Medicine

## 2011-03-05 ENCOUNTER — Encounter: Payer: Self-pay | Admitting: Family Medicine

## 2011-03-05 ENCOUNTER — Ambulatory Visit (INDEPENDENT_AMBULATORY_CARE_PROVIDER_SITE_OTHER): Payer: BC Managed Care – PPO | Admitting: Family Medicine

## 2011-03-05 VITALS — BP 130/68 | HR 77 | Resp 18 | Ht 63.5 in | Wt 128.0 lb

## 2011-03-05 DIAGNOSIS — F329 Major depressive disorder, single episode, unspecified: Secondary | ICD-10-CM

## 2011-03-05 DIAGNOSIS — F17200 Nicotine dependence, unspecified, uncomplicated: Secondary | ICD-10-CM

## 2011-03-05 DIAGNOSIS — E210 Primary hyperparathyroidism: Secondary | ICD-10-CM

## 2011-03-05 DIAGNOSIS — F3289 Other specified depressive episodes: Secondary | ICD-10-CM

## 2011-03-05 DIAGNOSIS — I10 Essential (primary) hypertension: Secondary | ICD-10-CM

## 2011-03-05 DIAGNOSIS — J329 Chronic sinusitis, unspecified: Secondary | ICD-10-CM

## 2011-03-05 DIAGNOSIS — M549 Dorsalgia, unspecified: Secondary | ICD-10-CM

## 2011-03-05 DIAGNOSIS — F341 Dysthymic disorder: Secondary | ICD-10-CM

## 2011-03-05 DIAGNOSIS — J40 Bronchitis, not specified as acute or chronic: Secondary | ICD-10-CM

## 2011-03-05 DIAGNOSIS — F32A Depression, unspecified: Secondary | ICD-10-CM

## 2011-03-05 DIAGNOSIS — R7309 Other abnormal glucose: Secondary | ICD-10-CM

## 2011-03-05 DIAGNOSIS — E785 Hyperlipidemia, unspecified: Secondary | ICD-10-CM

## 2011-03-05 MED ORDER — HYDROCODONE-ACETAMINOPHEN 10-500 MG PO TABS: 1.0000 | ORAL_TABLET | Freq: Four times a day (QID) | ORAL | Status: DC | PRN

## 2011-03-05 MED ORDER — ROSUVASTATIN CALCIUM 40 MG PO TABS: 40.0000 mg | ORAL_TABLET | Freq: Every day | ORAL | Status: DC

## 2011-03-05 MED ORDER — AMLODIPINE BESYLATE 5 MG PO TABS: 5.0000 mg | ORAL_TABLET | Freq: Every day | ORAL | Status: DC

## 2011-03-05 MED ORDER — PAROXETINE HCL 10 MG PO TABS: 10.0000 mg | ORAL_TABLET | ORAL | Status: DC

## 2011-03-05 MED ORDER — AZITHROMYCIN 250 MG PO TABS: ORAL_TABLET | ORAL | Status: AC

## 2011-03-05 MED ORDER — CHLORPHENIRAMINE-HYDROCODONE 8-10 MG/5ML PO LQCR: 5.0000 mL | Freq: Two times a day (BID) | ORAL | Status: DC | PRN

## 2011-03-05 NOTE — Patient Instructions (Addendum)
F/u in 4 months.  I am glad you feelbetter.  You are being treated for bronchitis and sinusitis . Return next Wednesday for flu vaccine.  Reduced dose of paxil to 10mg    Cut back cigarettes every 2 weeks start with 9 next week  HBA1C today.  Fasting lipid, chem 7, hepatic , HBa1C  In 4 months

## 2011-03-05 NOTE — Assessment & Plan Note (Signed)
hBA1C today

## 2011-03-05 NOTE — Progress Notes (Signed)
  Subjective:    Patient ID: Carla [name], female    DOB: [birth year], 65 y.o.   MRN: [id number]  HPI The PT is here for follow up and re-evaluation of chronic medical conditions, medication management and review of any available recent lab and radiology data.  Preventive health is updated, specifically  Cancer screening and Immunization.   Questions or concerns regarding consultations or procedures which the PT has had in the interim are  addressed. The PT denies any adverse reactions to current medications since the last visit. States her anxiety has improved significantly on paxil, card had tried to increase the dose but she did not tolerate that   1 week h/o increased head and chest congestion intermittent chills and yellow green nasal d/c and sputum     Review of Systems See HPI Denies chest pains, palpitations and leg swelling Denies abdominal pain, nausea, vomiting,diarrhea or constipation.   Denies dysuria, frequency, hesitancy or incontinence. Chronic back pain unchanged Denies headaches, seizures, numbness, or tingling. Denies depression, anxiety or insomnia. Denies skin break down or rash.        Objective:   Physical Exam Patient alert and oriented and in no cardiopulmonary distress.  HEENT: No facial asymmetry, EOMI, maxillary  sinus tenderness,  oropharynx pink and moist.  Neck supple no adenopathy.  Chest: Scattered crackles,Decreased air entr  CVS: S1, S2 no murmurs, no S3.  ABD: Soft non tender. Bowel sounds normal.  Ext: No edema  MS: Adequate ROM spine, shoulders, hips and knees.  Skin: Intact, no ulcerations or rash noted.  Psych: Good eye contact, normal affect. Memory intact not anxious or depressed appearing.  CNS: CN 2-12 intact, power, tone and sensation normal throughout.        Assessment & Plan:

## 2011-03-06 NOTE — Assessment & Plan Note (Signed)
Antibiotics prescribed 

## 2011-03-06 NOTE — Assessment & Plan Note (Signed)
Antibiotic , decongestant and cough suppressant prescribed 

## 2011-03-06 NOTE — Assessment & Plan Note (Signed)
Current 10/day, will attempt to taper every 2 weeks

## 2011-03-06 NOTE — Assessment & Plan Note (Signed)
Marked improvement on paxil, pt to continue same, dose reduced to 10mg  by pt due to intolerance to the 20mg  dose

## 2011-03-06 NOTE — Assessment & Plan Note (Signed)
Controlled, no change in medication  

## 2011-03-06 NOTE — Assessment & Plan Note (Signed)
Unchanged, med refilled.  

## 2011-03-06 NOTE — Assessment & Plan Note (Signed)
Hyperlipidemia:Low fat diet discussed and encouraged.  No med change at this time     

## 2011-03-11 ENCOUNTER — Ambulatory Visit (INDEPENDENT_AMBULATORY_CARE_PROVIDER_SITE_OTHER): Payer: BC Managed Care – PPO

## 2011-03-11 DIAGNOSIS — Z23 Encounter for immunization: Secondary | ICD-10-CM

## 2011-03-12 ENCOUNTER — Ambulatory Visit: Payer: BC Managed Care – PPO

## 2011-03-21 ENCOUNTER — Other Ambulatory Visit: Payer: Self-pay | Admitting: Family Medicine

## 2011-03-22 ENCOUNTER — Other Ambulatory Visit: Payer: Self-pay

## 2011-03-22 DIAGNOSIS — F419 Anxiety disorder, unspecified: Secondary | ICD-10-CM

## 2011-03-22 MED ORDER — ALPRAZOLAM 0.5 MG PO TABS: 0.5000 mg | ORAL_TABLET | Freq: Four times a day (QID) | ORAL | Status: DC | PRN

## 2011-04-16 ENCOUNTER — Other Ambulatory Visit: Payer: Self-pay | Admitting: Family Medicine

## 2011-05-13 ENCOUNTER — Other Ambulatory Visit: Payer: Self-pay | Admitting: Family Medicine

## 2011-05-15 ENCOUNTER — Encounter: Payer: Self-pay | Admitting: Gastroenterology

## 2011-06-10 ENCOUNTER — Other Ambulatory Visit: Payer: Self-pay | Admitting: Family Medicine

## 2011-06-19 ENCOUNTER — Telehealth: Payer: Self-pay | Admitting: Family Medicine

## 2011-06-20 NOTE — Telephone Encounter (Signed)
Ibuprofen or tylenol till she sees dentist

## 2011-06-20 NOTE — Telephone Encounter (Signed)
Patient aware.

## 2011-07-04 ENCOUNTER — Ambulatory Visit (INDEPENDENT_AMBULATORY_CARE_PROVIDER_SITE_OTHER): Payer: BC Managed Care – PPO | Admitting: Family Medicine

## 2011-07-04 ENCOUNTER — Encounter: Payer: Self-pay | Admitting: Family Medicine

## 2011-07-04 VITALS — BP 110/78 | HR 88 | Resp 16 | Ht 63.5 in | Wt 128.8 lb

## 2011-07-04 DIAGNOSIS — F329 Major depressive disorder, single episode, unspecified: Secondary | ICD-10-CM

## 2011-07-04 DIAGNOSIS — F17200 Nicotine dependence, unspecified, uncomplicated: Secondary | ICD-10-CM

## 2011-07-04 DIAGNOSIS — E559 Vitamin D deficiency, unspecified: Secondary | ICD-10-CM

## 2011-07-04 DIAGNOSIS — F341 Dysthymic disorder: Secondary | ICD-10-CM

## 2011-07-04 DIAGNOSIS — I10 Essential (primary) hypertension: Secondary | ICD-10-CM

## 2011-07-04 DIAGNOSIS — R7309 Other abnormal glucose: Secondary | ICD-10-CM

## 2011-07-04 DIAGNOSIS — E210 Primary hyperparathyroidism: Secondary | ICD-10-CM

## 2011-07-04 DIAGNOSIS — M549 Dorsalgia, unspecified: Secondary | ICD-10-CM

## 2011-07-04 DIAGNOSIS — E785 Hyperlipidemia, unspecified: Secondary | ICD-10-CM

## 2011-07-04 MED ORDER — PAROXETINE HCL 20 MG PO TABS: 20.0000 mg | ORAL_TABLET | Freq: Every day | ORAL | Status: DC

## 2011-07-04 MED ORDER — AMLODIPINE BESYLATE 5 MG PO TABS: 5.0000 mg | ORAL_TABLET | Freq: Every day | ORAL | Status: DC

## 2011-07-04 NOTE — Patient Instructions (Addendum)
F/u in 4 month  Pls cut back each month by 1 cigarette, call Dr Dietrich Pates for the script you talked about from him please.  Increase paroxetine 20mg  daily. Start 10mg  TWO daily till done   Fasting lipid, cmp,calcium and magnesium, hBA1C asap  HBA1C in 4 month

## 2011-07-04 NOTE — Progress Notes (Signed)
  Subjective:    Patient ID: Carla [name], female    DOB: Mar 04, 1946, 66 y.o.   MRN: [id number]  HPI The PT is here for follow up and re-evaluation of chronic medical conditions, medication management and review of any available recent lab and radiology data.  Preventive health is updated, specifically  Cancer screening and Immunization.   Questions or concerns regarding consultations or procedures which the PT has had in the interim are  addressed. The PT denies any adverse reactions to current medications since the last visit.  There are no new concerns.  There are no specific complaints       Review of Systems    See HPI Denies recent fever or chills. Denies sinus pressure, nasal congestion, ear pain or sore throat. Denies chest congestion,chronic smoker's cough, no wheezing. Denies chest pains, palpitations and leg swelling Denies abdominal pain, nausea, vomiting,diarrhea or constipation.   Denies dysuria, frequency, hesitancy or incontinence. Chronic back pain unchanged Denies headaches, seizures, numbness, or tingling. Denies uncontrolled  Depression,also has  anxiety , denies  insomnia. Denies skin break down or rash.     Objective:   Physical Exam Patient alert and oriented and in no cardiopulmonary distress.  HEENT: No facial asymmetry, EOMI, no sinus tenderness,  oropharynx pink and moist.  Neck supple no adenopathy.  Chest: Clear to auscultation bilaterally.Decreased  Bilateral whezes , no crackles  CVS: S1, S2 no murmurs, no S3.  ABD: Soft non tender. Bowel sounds normal.  Ext: No edema  MS: decreased  ROM spine,adequatein  shoulders, hips and knees.  Skin: Intact, no ulcerations or rash noted.  Psych: Good eye contact, normal affect. Memory intact,anxious and mildly  depressed appearing.  CNS: CN 2-12 intact, power, tone and sensation normal throughout.        Assessment & Plan:

## 2011-07-07 NOTE — Assessment & Plan Note (Signed)
Unchanged, pt to cut back gradually on nicotine use

## 2011-07-07 NOTE — Assessment & Plan Note (Signed)
Controlled, no change in medication  

## 2011-07-07 NOTE — Assessment & Plan Note (Signed)
Hyperlipidemia:Low fat diet discussed and encouraged.  Updated lab data needed

## 2011-07-07 NOTE — Assessment & Plan Note (Signed)
Improved , though still uncontrolled, dose increase in paxil

## 2011-07-07 NOTE — Assessment & Plan Note (Signed)
Unchanged, no change in [employer]

## 2011-07-08 ENCOUNTER — Other Ambulatory Visit: Payer: Self-pay | Admitting: Family Medicine

## 2011-07-10 LAB — COMPREHENSIVE METABOLIC PANEL
Alkaline Phosphatase: 107 U/L (ref 39–117)
BUN: 13 mg/dL (ref 6–23)
CO2: 27 mEq/L (ref 19–32)
Creat: 0.78 mg/dL (ref 0.50–1.10)
Glucose, Bld: 118 mg/dL — ABNORMAL HIGH (ref 70–99)
Sodium: 139 mEq/L (ref 135–145)
Total Bilirubin: 0.4 mg/dL (ref 0.3–1.2)
Total Protein: 7.6 g/dL (ref 6.0–8.3)

## 2011-07-10 LAB — LIPID PANEL
Cholesterol: 152 mg/dL (ref 0–200)
HDL: 44 mg/dL (ref >39)
Total CHOL/HDL Ratio: 3.5 Ratio
Triglycerides: 153 mg/dL — ABNORMAL HIGH (ref <150)
VLDL: 31 mg/dL (ref 0–40)

## 2011-08-02 ENCOUNTER — Other Ambulatory Visit: Payer: Self-pay | Admitting: Family Medicine

## 2011-08-14 ENCOUNTER — Telehealth: Payer: Self-pay

## 2011-08-14 NOTE — Telephone Encounter (Signed)
Patient called in with sore throat and nasal/chest congestion x 1 week. I advised no antibiotic without visit. States she wanted to see if she could get some cough/congestion med sent in because she did not feel like coming in. I told her more than likely not but she waned me to send the message

## 2011-08-15 ENCOUNTER — Telehealth: Payer: Self-pay | Admitting: Family Medicine

## 2011-08-15 MED ORDER — GUAIFENESIN-DM 100-10 MG/5ML PO SYRP: 5.0000 mL | ORAL_SOLUTION | Freq: Three times a day (TID) | ORAL | Status: AC | PRN

## 2011-08-15 MED ORDER — CHLORPHENIRAMINE-HYDROCODONE 8-10 MG/5ML PO LQCR: 5.0000 mL | Freq: Two times a day (BID) | ORAL | Status: DC | PRN

## 2011-08-15 NOTE — Telephone Encounter (Signed)
Tussionex to be faxed, pt had a few months ago

## 2011-08-17 ENCOUNTER — Encounter (HOSPITAL_COMMUNITY): Payer: Self-pay | Admitting: *Deleted

## 2011-08-17 ENCOUNTER — Emergency Department (HOSPITAL_COMMUNITY)
Admission: EM | Admit: 2011-08-17 | Discharge: 2011-08-17 | Disposition: A | Discharge status: Home / Self Care | Payer: Medicare Other | Attending: Emergency Medicine | Admitting: Emergency Medicine

## 2011-08-17 DIAGNOSIS — J441 Chronic obstructive pulmonary disease with (acute) exacerbation: Secondary | ICD-10-CM | POA: Insufficient documentation

## 2011-08-17 DIAGNOSIS — I10 Essential (primary) hypertension: Secondary | ICD-10-CM | POA: Insufficient documentation

## 2011-08-17 DIAGNOSIS — K219 Gastro-esophageal reflux disease without esophagitis: Secondary | ICD-10-CM | POA: Insufficient documentation

## 2011-08-17 DIAGNOSIS — M810 Age-related osteoporosis without current pathological fracture: Secondary | ICD-10-CM | POA: Insufficient documentation

## 2011-08-17 DIAGNOSIS — I2510 Atherosclerotic heart disease of native coronary artery without angina pectoris: Secondary | ICD-10-CM | POA: Insufficient documentation

## 2011-08-17 DIAGNOSIS — F17200 Nicotine dependence, unspecified, uncomplicated: Secondary | ICD-10-CM | POA: Insufficient documentation

## 2011-08-17 DIAGNOSIS — Z79899 Other long term (current) drug therapy: Secondary | ICD-10-CM | POA: Insufficient documentation

## 2011-08-17 MED ORDER — PREDNISONE 20 MG PO TABS: ORAL_TABLET | ORAL | Status: AC

## 2011-08-17 MED ORDER — SULFAMETHOXAZOLE-TRIMETHOPRIM 800-160 MG PO TABS: 1.0000 | ORAL_TABLET | Freq: Two times a day (BID) | ORAL | Status: AC

## 2011-08-17 MED ORDER — ALBUTEROL SULFATE HFA 108 (90 BASE) MCG/ACT IN AERS: 2.0000 | INHALATION_SPRAY | RESPIRATORY_TRACT | Status: DC | PRN

## 2011-08-17 NOTE — Discharge Instructions (Signed)
Chronic Obstructive Pulmonary Disease Chronic obstructive pulmonary disease (COPD) is a lung disease. The lungs become damaged, making it hard to get air in and out of your lungs. The damage to your lungs cannot be changed.  HOME CARE  Stop smoking if you smoke. Avoid secondhand smoke.   Only take medicine as told by your doctor.   Talk to your doctor about using cough syrup or over-the-counter medicines.   Drink enough fluids to keep your pee (urine) clear or pale yellow.   Use a humidifier or vaporizer. This may help loosen the thick spit (mucus).   Talk to your doctor about vaccines that help prevent other lung problems (pneumonia and flu vaccines).   Use home oxygen as told by your doctor.   Stay active and exercise.   Eat healthy foods.  GET HELP RIGHT AWAY IF:   Your heart is beating fast.   You become disturbed, confused, shake, or are dazed.   You have trouble breathing.   You have chest pain.   You have a fever.   You cough up thick spit that is yellowish-white or green.   Your breathing becomes worse when you exercise.   You are running out of the medicine you take for your breathing.  MAKE SURE YOU:   Understand these instructions.   Will watch your condition.   Will get help right away if you are not doing well or get worse.  Document Released: 08/28/2007 Document Revised: 02/28/2011 Document Reviewed: 05/11/2010 [facility] Patient Information 2012 [place], [place].   RETURN IMMEDIATELY IF you develop shortness of breath, confusion or altered mental status, a new rash, become dizzy, faint, or poorly responsive, or are unable to be cared for at home.

## 2011-08-17 NOTE — ED Provider Notes (Signed)
History   This chart was scribed for No att. providers found by Toya Smothers. The patient was seen in room APFT22/APFT22. Patient's care was started at 1832.  CSN: [id number]  Arrival date & time 08/17/11  4098   First MD Initiated Contact with Patient 08/17/11 1912     Chief Complaint  Patient presents with  . Cough    Patient is a 66 y.o. female presenting with cough.  Cough Associated symptoms include shortness of breath and wheezing. Pertinent negatives include no chest pain, no headaches, no sore throat and no eye redness.    LATOI GIRALDO is a 66 y.o. female who presents to the Emergency Department complaining of gradual onset moderate severe constant cough onset one week ago with associate wheezing, congestion, and mild SOB, denying fever, sore throat, and HA. Pt list COPD and a h/o everyday smoking.   Past Medical History  Diagnosis Date  . ASCVD (arteriosclerotic cardiovascular disease)     No critical disease on cath in 8/97: mild AI with trival, if any l AS; negative stress nuclear in 2010  . Peripheral vascular disease     stauts post aortabifemoral graft    . Tobacco abuse 2010    Continue at 1/2 pack per day   . Chronic bronchitis   . Hypertension   . Fasting hyperglycemia   . Elevated hemoglobin A1c   . Hydronephrosis   . GERD (gastroesophageal reflux disease)   . Back pain   . Depression   . Osteoporosis     Past Surgical History  Procedure Date  . Cholecystectomy   . Aorta bifem graft   . Right kidney surgery following damage during arterial surgery   . Partial hysterectomy   . Total abdominal hysterectomy w/ bilateral salpingoophorectomy 1975  . Lysis of adhesions 2000    Family History  Problem Relation Age of Onset  . Heart attack Mother   . Heart attack Father   . COPD Sister   . Diabetes Sister   . Diabetes Sister   . Coronary artery disease Sister   . Diabetes Sister   . Heart disease Sister   . COPD Sister     History  Substance  Use Topics  . Smoking status: Current Everyday Smoker -- 0.5 packs/day    Types: Cigarettes  . Smokeless tobacco: Not on file  . Alcohol Use: No   Review of Systems  Constitutional: Negative for fever.       10 Systems reviewed and are negative for acute change except as noted in the HPI.  HENT: Positive for congestion. Negative for sore throat and neck pain.   Eyes: Negative for discharge and redness.  Respiratory: Positive for cough, shortness of breath and wheezing. Negative for chest tightness.   Cardiovascular: Negative for chest pain.  Gastrointestinal: Negative for vomiting and abdominal pain.  Musculoskeletal: Negative for back pain.  Skin: Negative for rash.  Neurological: Negative for syncope, numbness and headaches.  Psychiatric/Behavioral: Negative for confusion.       No behavior change.    Allergies  Review of patient's allergies indicates no known allergies.  Home Medications   Current Outpatient Rx  Name Route Sig Dispense Refill  . ALBUTEROL SULFATE HFA 108 (90 BASE) MCG/ACT IN AERS Inhalation Inhale 2 puffs into the lungs every 2 (two) hours as needed for wheezing or shortness of breath (cough). 1 Inhaler 0  . AMLODIPINE BESYLATE 5 MG PO TABS Oral Take 1 tablet (5 mg total) by mouth daily.  90 tablet 1  . ASPIRIN 81 MG PO TBEC Oral Take 81 mg by mouth daily. Take one tablet by mouth once daily     . CHLORPHENIRAMINE-HYDROCODONE 8-10 MG/5ML PO LQCR Oral Take 5 mLs by mouth every 12 (twelve) hours as needed for cough. 240 mL 0  . GUAIFENESIN-DM 100-10 MG/5ML PO SYRP Oral Take 5 mLs by mouth 3 (three) times daily as needed for cough. 240 mL 0  . LORTAB 10-500 MG PO TABS  TAKE (1) TABLET BY MOUTH FOUR TIMES DAILY AS NEEDED FOR PAIN. 120 each 1  . PAROXETINE HCL 20 MG PO TABS Oral Take 1 tablet (20 mg total) by mouth daily. 30 tablet 2    Dose increase effective 07/04/2011  . PREDNISONE 20 MG PO TABS  3 tabs po day one, then 2 po daily x 4 days 11 tablet 0  .  ROSUVASTATIN CALCIUM 40 MG PO TABS Oral Take 1 tablet (40 mg total) by mouth at bedtime. 30 tablet 5  . SULFAMETHOXAZOLE-TRIMETHOPRIM 800-160 MG PO TABS Oral Take 1 tablet by mouth 2 (two) times daily. One po bid x 7 days 14 tablet 0  . XANAX 0.5 MG PO TABS  TAKE (1) TABLET BY MOUTH (4) TIMES DAILY AS NEEDED FOR ANXIETY. 120 each 4    BP 124/63  Pulse 87  Temp 98.2 F (36.8 C)  Resp 20  Ht 5\' 2"  (1.575 m)  Wt 128 lb (58.06 kg)  BMI 23.41 kg/m2  SpO2 95%  Physical Exam  Nursing note and vitals reviewed. Constitutional:       Awake, alert, nontoxic appearance.  HENT:  Head: Normocephalic and atraumatic.  Mouth/Throat: Oropharynx is clear and moist.  Eyes: Right eye exhibits no discharge. Left eye exhibits no discharge.  Neck: Neck supple.  Pulmonary/Chest: Effort normal. She has wheezes (mild difuse). She exhibits no tenderness.       No fluid crackles  Abdominal: Soft. There is no tenderness. There is no rebound.  Musculoskeletal: She exhibits no tenderness.       Baseline ROM, no obvious new focal weakness.  Neurological:       Mental status and motor strength appears baseline for patient and situation.  Skin: No rash noted.  Psychiatric: She has a normal mood and affect.    ED Course  Procedures (including critical care time)  DIAGNOSTIC STUDIES: Oxygen Saturation is 95% on room air, adequate by my interpretation.    COORDINATION OF CARE: 7:18PM- Discussed wheezing and the need for inhaler   1. COPD with acute exacerbation      MDM  I personally performed the services described in this documentation, which was scribed in my presence. The recorded information has been reviewed and considered. Pt stable in ED with no significant deterioration in condition.Patient / Family / Caregiver informed of clinical course, understand medical decision-making process, and agree with plan.I doubt any other EMC precluding discharge at this time including, but not necessarily  limited to the following:sepsis.   Hurman Horn, MD 08/19/11 2229

## 2011-08-17 NOTE — ED Notes (Signed)
Pt c/o cough and non productive cough x 1 week. Denies fever.

## 2011-09-30 ENCOUNTER — Other Ambulatory Visit: Payer: Self-pay | Admitting: Family Medicine

## 2011-09-30 ENCOUNTER — Other Ambulatory Visit: Payer: Self-pay

## 2011-09-30 MED ORDER — HYDROCODONE-ACETAMINOPHEN 10-500 MG PO TABS: ORAL_TABLET | ORAL | Status: DC

## 2011-10-15 ENCOUNTER — Encounter: Payer: Self-pay | Admitting: Family Medicine

## 2011-10-15 ENCOUNTER — Ambulatory Visit (INDEPENDENT_AMBULATORY_CARE_PROVIDER_SITE_OTHER): Payer: Medicare Other | Admitting: Family Medicine

## 2011-10-15 VITALS — BP 118/60 | HR 76 | Resp 18 | Ht 63.5 in

## 2011-10-15 DIAGNOSIS — F329 Major depressive disorder, single episode, unspecified: Secondary | ICD-10-CM

## 2011-10-15 DIAGNOSIS — F419 Anxiety disorder, unspecified: Secondary | ICD-10-CM

## 2011-10-15 DIAGNOSIS — I10 Essential (primary) hypertension: Secondary | ICD-10-CM

## 2011-10-15 DIAGNOSIS — M549 Dorsalgia, unspecified: Secondary | ICD-10-CM | POA: Insufficient documentation

## 2011-10-15 DIAGNOSIS — F17200 Nicotine dependence, unspecified, uncomplicated: Secondary | ICD-10-CM

## 2011-10-15 DIAGNOSIS — F341 Dysthymic disorder: Secondary | ICD-10-CM

## 2011-10-15 DIAGNOSIS — E785 Hyperlipidemia, unspecified: Secondary | ICD-10-CM

## 2011-10-15 MED ORDER — KETOROLAC TROMETHAMINE 60 MG/2ML IJ SOLN
60.0000 mg | Freq: Once | INTRAMUSCULAR | Status: AC
  Administered 2011-10-15: 60 mg via INTRAMUSCULAR

## 2011-10-15 MED ORDER — FENTANYL 25 MCG/HR TD PT72: 1.0000 | MEDICATED_PATCH | TRANSDERMAL | Status: DC

## 2011-10-15 MED ORDER — IBUPROFEN 800 MG PO TABS: 800.0000 mg | ORAL_TABLET | Freq: Three times a day (TID) | ORAL | Status: AC

## 2011-10-15 MED ORDER — PREDNISONE (PAK) 10 MG PO TABS: 10.0000 mg | ORAL_TABLET | Freq: Every day | ORAL | Status: AC

## 2011-10-15 MED ORDER — METHYLPREDNISOLONE ACETATE 80 MG/ML IJ SUSP
80.0000 mg | Freq: Once | INTRAMUSCULAR | Status: AC
  Administered 2011-10-15: 80 mg via INTRAMUSCULAR

## 2011-10-15 MED ORDER — CYCLOBENZAPRINE HCL 10 MG PO TABS: 10.0000 mg | ORAL_TABLET | Freq: Three times a day (TID) | ORAL | Status: DC | PRN

## 2011-10-15 NOTE — Progress Notes (Signed)
  Subjective:    Patient ID: Carla [name], female    DOB: 04-Oct-1945, 66 y.o.   MRN: [id number]  HPI 3 day h/o severe back pain radiating to right leg to toes, states she turned to right and then pain started, no incontinence of stool or urine , slight reduction in sensation noted. No other new issues.Has not been able to safely walk unassisted since this began  Still smokes and is unwilling to set quit date   Review of Systems See HPI Denies recent fever or chills. Denies sinus pressure, nasal congestion, ear pain or sore throat. Denies chest congestion, productive cough or wheezing. Denies chest pains, palpitations and leg swelling Denies abdominal pain, nausea, vomiting,diarrhea or constipation.   Denies dysuria, frequency, hesitancy or incontinence.  Denies headaches, seizures, numbness, or tingling. Denies depression, anxiety or insomnia. Denies skin break down or rash.        Objective:   Physical Exam Patient alert and oriented and in no cardiopulmonary distress.Pt in pain  HEENT: No facial asymmetry, EOMI, no sinus tenderness,  oropharynx pink and moist.  Neck supple no adenopathy.  Chest: Clear to auscultation bilaterally.Decreased air entry throughout  CVS: S1, S2 no murmurs, no S3.  ABD: Soft non tender. Bowel sounds normal.  Ext: No edema  MS: decreased  ROM spine, spt non ambulatory at thsi visit  Skin: Intact, no ulcerations or rash noted.  Psych: Good eye contact, normal affect. Memory intact not anxious or depressed appearing.  CNS: CN 2-12 intact, power, and sensation decreased in right lowe extremity       Assessment & Plan:

## 2011-10-15 NOTE — Patient Instructions (Addendum)
Annual wellness exam at next vist in 3 months if none scheduled.   Toradol 60mg  and depo medrol 80mg  iM  In office, ibuprofen, anti inflammatories and muscle relaxant prescribed, and duragesic pain patch prescribed for use for the next 15 days  Call in 2 days if no better for mRI spine to be ordered.  You will get a script for a cane  You are referred for chest ct and need to stop smoking   Please think about quitting smoking.  This is very important for your health.  Consider setting a quit date, then cutting back or switching brands to prepare to stop.  Also think of the money you will save every day by not smoking.  Quick Tips to Quit Smoking: Fix a date i.e. keep a date in mind from when you would not touch a tobacco product to smoke  Keep yourself busy and block your mind with work loads or reading books or watching movies in malls where smoking is not allowed  Vanish off the things which reminds you about smoking for example match box, or your favorite lighter, or the pipe you used for smoking, or your favorite jeans and shirt with which you used to enjoy smoking, or the club where you used to do smoking  Try to avoid certain people places and incidences where and with whom smoking is a common factor to add on  Praise yourself with some token gifts from the money you saved by stopping smoking  Anti Smoking teams are there to help you. Join their programs  Anti-smoking Gums are there in many medical shops. Try them to quit smoking   Side-effects of Smoking: Disease caused by smoking cigarettes are emphysema, bronchitis, heart failures  Premature death  Cancer is the major side effect of smoking  Heart attacks and strokes are the quick effects of smoking causing sudden death  Some smokers lives end up with limbs amputated  Breathing problem or fast breathing is another side effect of smoking  Due to more intakes of smokes, carbon mono-oxide goes into your brain and other muscles of  the body which leads to swelling of the veins and blockage to the air passage to lungs  Carbon monoxide blocks blood vessels which leads to blockage in the flow of blood to different major body organs like heart lungs and thus leads to attacks and deaths  During pregnancy smoking is very harmful and leads to premature birth of the infant, spontaneous abortions, low weight of the infant during birth  Fat depositions to narrow and blocked blood vessels causing heart attacks  In many cases cigarette smoking caused infertility in men

## 2011-10-17 ENCOUNTER — Telehealth: Payer: Self-pay | Admitting: Family Medicine

## 2011-10-17 ENCOUNTER — Ambulatory Visit (HOSPITAL_COMMUNITY)
Admission: RE | Admit: 2011-10-17 | Discharge: 2011-10-17 | Disposition: A | Discharge status: Home / Self Care | Payer: Medicare Other | Source: Ambulatory Visit | Attending: Family Medicine | Admitting: Family Medicine

## 2011-10-17 DIAGNOSIS — M545 Low back pain, unspecified: Secondary | ICD-10-CM | POA: Insufficient documentation

## 2011-10-17 DIAGNOSIS — M51379 Other intervertebral disc degeneration, lumbosacral region without mention of lumbar back pain or lower extremity pain: Secondary | ICD-10-CM | POA: Insufficient documentation

## 2011-10-17 DIAGNOSIS — M5126 Other intervertebral disc displacement, lumbar region: Secondary | ICD-10-CM | POA: Insufficient documentation

## 2011-10-17 DIAGNOSIS — M549 Dorsalgia, unspecified: Secondary | ICD-10-CM

## 2011-10-17 DIAGNOSIS — M79609 Pain in unspecified limb: Secondary | ICD-10-CM | POA: Insufficient documentation

## 2011-10-17 DIAGNOSIS — M5137 Other intervertebral disc degeneration, lumbosacral region: Secondary | ICD-10-CM | POA: Insufficient documentation

## 2011-10-17 NOTE — Telephone Encounter (Signed)
pls order MRI of lumbar spine I am putting in the referral

## 2011-10-18 ENCOUNTER — Other Ambulatory Visit: Payer: Self-pay | Admitting: Family Medicine

## 2011-10-18 MED ORDER — HYDROCODONE-ACETAMINOPHEN 10-500 MG PO TABS: ORAL_TABLET | ORAL | Status: DC

## 2011-10-25 ENCOUNTER — Other Ambulatory Visit: Payer: Self-pay | Admitting: Family Medicine

## 2011-10-28 ENCOUNTER — Telehealth: Payer: Self-pay | Admitting: Orthopedic Surgery

## 2011-10-28 ENCOUNTER — Ambulatory Visit (INDEPENDENT_AMBULATORY_CARE_PROVIDER_SITE_OTHER): Payer: Medicare Other | Admitting: Family Medicine

## 2011-10-28 ENCOUNTER — Other Ambulatory Visit: Payer: Self-pay | Admitting: Family Medicine

## 2011-10-28 ENCOUNTER — Encounter: Payer: Self-pay | Admitting: Family Medicine

## 2011-10-28 VITALS — BP 128/60 | HR 99 | Resp 18 | Ht 63.5 in | Wt 120.1 lb

## 2011-10-28 DIAGNOSIS — R2240 Localized swelling, mass and lump, unspecified lower limb: Secondary | ICD-10-CM

## 2011-10-28 DIAGNOSIS — I10 Essential (primary) hypertension: Secondary | ICD-10-CM

## 2011-10-28 DIAGNOSIS — M549 Dorsalgia, unspecified: Secondary | ICD-10-CM

## 2011-10-28 DIAGNOSIS — R229 Localized swelling, mass and lump, unspecified: Secondary | ICD-10-CM

## 2011-10-28 NOTE — Telephone Encounter (Signed)
Received a referral from [name] for Carla [name] to see Dr. Romeo Apple for back pain.  The patient said her back is not hurting now  And she did not want to schedule the appointment.

## 2011-10-28 NOTE — Patient Instructions (Addendum)
F/u as before.  You ar referred for an ultrasound of the swelling behind your knee.   You need to stop smoking

## 2011-10-29 ENCOUNTER — Ambulatory Visit: Payer: Medicare Other | Admitting: Family Medicine

## 2011-10-29 ENCOUNTER — Inpatient Hospital Stay (HOSPITAL_COMMUNITY): Admission: RE | Admit: 2011-10-29 | Payer: Medicare Other | Source: Ambulatory Visit

## 2011-10-29 ENCOUNTER — Other Ambulatory Visit: Payer: Self-pay | Admitting: Family Medicine

## 2011-10-29 NOTE — Assessment & Plan Note (Signed)
Improved, no cause for recent acute decompensation seen on updated MRI lumbar spine

## 2011-10-29 NOTE — Progress Notes (Signed)
  Subjective:    Patient ID: Carla [name], female    DOB: 1945/12/09, 66 y.o.   MRN: [id number]  HPI Pt in today, stating that her back pain is much better, however, she now has concerns about a painful lump behind her right knee. Unsure how long she has had this for, no trauma to knee. She had been referred to ortho about the back pain , but elected to come here instead with the knee complaint. Recent MRI lumbar spine was fairly normal   Review of Systems    See HPI Denies recent fever or chills. Denies sinus pressure, nasal congestion, ear pain or sore throat.  Denies chest pains, palpitations and leg swelling Denies abdominal pain, nausea, vomiting,diarrhea or constipation.   Denies dysuria, frequency, hesitancy or incontinence. . Denies headaches, seizures, numbness, or tingling. Denies uncontrolled  depression, anxiety or insomnia.    Objective:   Physical Exam  Patient alert and oriented and in no cardiopulmonary distress.  HEENT: No facial asymmetry, EOMI, no sinus tenderness,  oropharynx pink and moist.  Neck supple no adenopathy.  Chest: Clear to auscultation bilaterally.decreased air entry, scattered crackles and few wheezes  CVS: S1, S2 no murmurs, no S3.  ABD: Soft non tender. Bowel sounds normal.  Ext: No edema  MS: Adequate ROM spine, shoulders, hips and knees.  Skin: Intact, Mildly tender lump on back of right  knee, not mobile, no erythema or warmth  Psych: Good eye contact, normal affect. Memory intact not anxious or depressed appearing.  CNS: CN 2-12 intact, power, tone and sensation normal throughout.       Assessment & Plan:

## 2011-10-29 NOTE — Assessment & Plan Note (Signed)
Controlled, no change in medication  

## 2011-10-29 NOTE — Assessment & Plan Note (Addendum)
Tender swelling behind right  Knee, seems too be a lipoma, will refer for [place]

## 2011-10-30 ENCOUNTER — Telehealth: Payer: Self-pay | Admitting: Family Medicine

## 2011-10-30 MED ORDER — HYDROCODONE-ACETAMINOPHEN 10-500 MG PO TABS: ORAL_TABLET | ORAL | Status: DC

## 2011-10-30 NOTE — Telephone Encounter (Signed)
Printed for Dr to sign  

## 2011-10-31 ENCOUNTER — Other Ambulatory Visit: Payer: Self-pay

## 2011-10-31 MED ORDER — HYDROCODONE-ACETAMINOPHEN 10-500 MG PO TABS: ORAL_TABLET | ORAL | Status: DC

## 2011-11-02 NOTE — Assessment & Plan Note (Signed)
Controlled, no change in medication  

## 2011-11-02 NOTE — Assessment & Plan Note (Addendum)
New severe uncontrolled pain, aggravating factor identified, will treat aggresively with ant inflammatories, add fentanyl

## 2011-11-02 NOTE — Assessment & Plan Note (Signed)
Hyperlipidemia:Low fat diet discussed and encouraged.  Updated labs needed 

## 2011-11-07 ENCOUNTER — Ambulatory Visit: Payer: BC Managed Care – PPO | Admitting: Family Medicine

## 2011-11-21 ENCOUNTER — Telehealth: Payer: Self-pay | Admitting: Family Medicine

## 2011-11-21 NOTE — Telephone Encounter (Signed)
Called to notify patient that she needs to have an office visit.  No answering machine.

## 2011-11-22 ENCOUNTER — Encounter (HOSPITAL_COMMUNITY): Payer: Self-pay

## 2011-11-22 ENCOUNTER — Emergency Department (HOSPITAL_COMMUNITY)
Admission: EM | Admit: 2011-11-22 | Discharge: 2011-11-22 | Disposition: A | Discharge status: Home / Self Care | Payer: Medicare Other | Attending: Emergency Medicine | Admitting: Emergency Medicine

## 2011-11-22 ENCOUNTER — Telehealth: Payer: Self-pay | Admitting: Family Medicine

## 2011-11-22 DIAGNOSIS — I10 Essential (primary) hypertension: Secondary | ICD-10-CM | POA: Insufficient documentation

## 2011-11-22 DIAGNOSIS — I70209 Unspecified atherosclerosis of native arteries of extremities, unspecified extremity: Secondary | ICD-10-CM | POA: Insufficient documentation

## 2011-11-22 DIAGNOSIS — N390 Urinary tract infection, site not specified: Secondary | ICD-10-CM | POA: Insufficient documentation

## 2011-11-22 DIAGNOSIS — K219 Gastro-esophageal reflux disease without esophagitis: Secondary | ICD-10-CM | POA: Insufficient documentation

## 2011-11-22 DIAGNOSIS — M810 Age-related osteoporosis without current pathological fracture: Secondary | ICD-10-CM | POA: Insufficient documentation

## 2011-11-22 DIAGNOSIS — F3289 Other specified depressive episodes: Secondary | ICD-10-CM | POA: Insufficient documentation

## 2011-11-22 DIAGNOSIS — F329 Major depressive disorder, single episode, unspecified: Secondary | ICD-10-CM | POA: Insufficient documentation

## 2011-11-22 DIAGNOSIS — I2510 Atherosclerotic heart disease of native coronary artery without angina pectoris: Secondary | ICD-10-CM | POA: Insufficient documentation

## 2011-11-22 LAB — URINALYSIS, ROUTINE W REFLEX MICROSCOPIC
Bilirubin Urine: NEGATIVE
Ketones, ur: NEGATIVE mg/dL
Protein, ur: 100 mg/dL — AB
Urobilinogen, UA: 0.2 mg/dL (ref 0.0–1.0)

## 2011-11-22 MED ORDER — LIDOCAINE HCL (PF) 1 % IJ SOLN
INTRAMUSCULAR | Status: AC
  Administered 2011-11-22: 11:00:00
  Filled 2011-11-22: qty 5

## 2011-11-22 MED ORDER — PHENAZOPYRIDINE HCL 200 MG PO TABS: 200.0000 mg | ORAL_TABLET | Freq: Three times a day (TID) | ORAL | Status: AC

## 2011-11-22 MED ORDER — ONDANSETRON 4 MG PO TBDP
4.0000 mg | ORAL_TABLET | Freq: Once | ORAL | Status: AC
  Administered 2011-11-22: 4 mg via ORAL
  Filled 2011-11-22: qty 1

## 2011-11-22 MED ORDER — CEPHALEXIN 500 MG PO CAPS: 500.0000 mg | ORAL_CAPSULE | Freq: Four times a day (QID) | ORAL | Status: AC

## 2011-11-22 MED ORDER — CEFTRIAXONE SODIUM 250 MG IJ SOLR
250.0000 mg | Freq: Once | INTRAMUSCULAR | Status: AC
  Administered 2011-11-22: 250 mg via INTRAMUSCULAR
  Filled 2011-11-22: qty 250

## 2011-11-22 NOTE — ED Provider Notes (Signed)
History    This chart was scribed for Carla Jakes, MD, MD by Smitty Pluck. The patient was seen in room APA04 and the patient's care was started at 10:16AM.   CSN: [id number]  Arrival date & time 11/22/11  0929   First MD Initiated Contact with Patient 11/22/11 [phone]      Chief Complaint  Patient presents with  . Back Pain  . Urinary Frequency    (Consider location/radiation/quality/duration/timing/severity/associated sxs/prior treatment) Patient is a 66 y.o. female presenting with back pain and frequency. The history is provided by the patient.  Back Pain  This is a new problem. The current episode started yesterday. The problem occurs constantly. The problem has not changed since onset.The pain is associated with no known injury. The pain is at a severity of 6/10. The pain is moderate. Associated symptoms include dysuria.  Urinary Frequency This is a new problem. The current episode started more than 2 days ago.   BRENDALY TOWNSEL is a 66 y.o. female who presents to the Emergency Department complaining of moderate lower back pain, right flank pain onset 1 day ago and dysuria onset 3 days ago. Pt reports that symptoms are constant. Pain is rated at 6/10. Pt reports having nausea this AM. Pt denies fever, vomiting and chest pain.   PCP is [name]   Past Medical History  Diagnosis Date  . ASCVD (arteriosclerotic cardiovascular disease)     No critical disease on cath in 8/97: mild AI with trival, if any l AS; negative stress nuclear in 2010  . Peripheral vascular disease     stauts post aortabifemoral graft    . Tobacco abuse 2010    Continue at 1/2 pack per day   . Chronic bronchitis   . Hypertension   . Fasting hyperglycemia   . Elevated hemoglobin A1c   . Hydronephrosis   . GERD (gastroesophageal reflux disease)   . Back pain   . Depression   . Osteoporosis     Past Surgical History  Procedure Date  . Cholecystectomy   . Aorta bifem graft   . Right kidney  surgery following damage during arterial surgery   . Partial hysterectomy   . Total abdominal hysterectomy w/ bilateral salpingoophorectomy 1975  . Lysis of adhesions 2000  . Abdominal hysterectomy     Family History  Problem Relation Age of Onset  . Heart attack Mother   . Heart attack Father   . COPD Sister   . Diabetes Sister   . Diabetes Sister   . Coronary artery disease Sister   . Diabetes Sister   . Heart disease Sister   . COPD Sister     History  Substance Use Topics  . Smoking status: Current Everyday Smoker -- 0.5 packs/day    Types: Cigarettes  . Smokeless tobacco: Not on file  . Alcohol Use: No    OB History    Grav Para Term Preterm Abortions TAB SAB Ect Mult Living                  Review of Systems  Genitourinary: Positive for dysuria and frequency.  Musculoskeletal: Positive for back pain.  All other systems reviewed and are negative.    Allergies  Review of patient's allergies indicates no known allergies.  Home Medications   Current Outpatient Rx  Name Route Sig Dispense Refill  . AMLODIPINE BESYLATE 5 MG PO TABS Oral Take 1 tablet (5 mg total) by mouth daily. 90 tablet 1  .  ASPIRIN 81 MG PO TBEC Oral Take 81 mg by mouth daily. Take one tablet by mouth once daily     . CRESTOR 40 MG PO TABS  TAKE (1) TABLET BY MOUTH DAILY AT BEDTIME. 30 each 4  . HYDROCODONE-ACETAMINOPHEN 10-500 MG PO TABS  TAKE (1) TABLET BY MOUTH FOUR TIMES DAILY AS NEEDED FOR PAIN. 120 tablet 1  . PAROXETINE HCL 20 MG PO TABS Oral Take 1 tablet (20 mg total) by mouth daily. 30 tablet 2    Dose increase effective 07/04/2011  . XANAX 0.5 MG PO TABS  TAKE (1) TABLET BY MOUTH (4) TIMES DAILY AS NEEDED FOR ANXIETY. 120 each 4  . CEPHALEXIN 500 MG PO CAPS Oral Take 1 capsule (500 mg total) by mouth 4 (four) times daily. 28 capsule 0  . PHENAZOPYRIDINE HCL 200 MG PO TABS Oral Take 1 tablet (200 mg total) by mouth 3 (three) times daily. 6 tablet 0    BP 141/71  Pulse 86  Temp  98.3 F (36.8 C) (Oral)  Resp 20  Ht 5\' 2"  (1.575 m)  Wt 125 lb (56.7 kg)  BMI 22.86 kg/m2  SpO2 100%  Physical Exam  Nursing note and vitals reviewed. Constitutional: She is oriented to person, place, and time. She appears well-developed and well-nourished. No distress.  HENT:  Head: Normocephalic and atraumatic.  Cardiovascular: Normal rate, regular rhythm and normal heart sounds.   No murmur heard. Pulmonary/Chest: Effort normal and breath sounds normal. No respiratory distress. She has no wheezes. She has no rales.  Abdominal: Bowel sounds are decreased. There is no tenderness.  Musculoskeletal: Normal range of motion. She exhibits no edema.  Neurological: She is alert and oriented to person, place, and time. No cranial nerve deficit.  Skin: Skin is warm and dry.  Psychiatric: She has a normal mood and affect. Her behavior is normal.    ED Course  Procedures (including critical care time) DIAGNOSTIC STUDIES: Oxygen Saturation is 100% on room air, normal by my interpretation.    COORDINATION OF CARE:    Labs Reviewed  URINALYSIS, ROUTINE W REFLEX MICROSCOPIC - Abnormal; Notable for the following:    Hgb urine dipstick LARGE (*)     Protein, ur 100 (*)     Nitrite POSITIVE (*)     Leukocytes, UA MODERATE (*)     All other components within normal limits  URINE MICROSCOPIC-ADD ON - Abnormal; Notable for the following:    Bacteria, UA MANY (*)     All other components within normal limits  URINE CULTURE   No results found. Results for orders placed during the hospital encounter of 11/22/11  URINALYSIS, ROUTINE W REFLEX MICROSCOPIC      Component Value Range   Color, Urine YELLOW  YELLOW   APPearance CLEAR  CLEAR   Specific Gravity, Urine 1.020  1.005 - 1.030   pH 6.0  5.0 - 8.0   Glucose, UA NEGATIVE  NEGATIVE mg/dL   Hgb urine dipstick LARGE (*) NEGATIVE   Bilirubin Urine NEGATIVE  NEGATIVE   Ketones, ur NEGATIVE  NEGATIVE mg/dL   Protein, ur 161 (*) NEGATIVE  mg/dL   Urobilinogen, UA 0.2  0.0 - 1.0 mg/dL   Nitrite POSITIVE (*) NEGATIVE   Leukocytes, UA MODERATE (*) NEGATIVE  URINE MICROSCOPIC-ADD ON      Component Value Range   WBC, UA TOO NUMEROUS TO COUNT  <3 WBC/hpf   RBC / HPF TOO NUMEROUS TO COUNT  <3 RBC/hpf   Bacteria,  UA MANY (*) RARE     1. Urinary tract infection       MDM  Patient's symptoms are consistent with urinary tract infection perhaps some early pyelonephritis. UA very consistent with urinary tract infection will treat with Rocephin IM in the emergency department and sent home with a prescription for Keflex) DM. Patient knows that she should be better in 2 days if not she will need to followup or follow up earlier if she is worse.   I personally performed the services described in this documentation, which was scribed in my presence. The recorded information has been reviewed and considered.        Carla Jakes, MD 11/22/11 [phone]

## 2011-11-22 NOTE — ED Notes (Signed)
Pt reports right flank pain and low back pain that started 2 days ago, pain got better then returned during the night. Unable to sleep and freq. Urination.

## 2011-11-22 NOTE — ED Notes (Signed)
Patient with no complaints at this time. Respirations even and unlabored. Skin warm/dry. Discharge instructions reviewed with patient at this time. Patient given opportunity to voice concerns/ask questions. Patient discharged at this time and left Emergency Department with steady gait.   

## 2011-11-25 LAB — URINE CULTURE: Colony Count: >=100000

## 2011-11-26 NOTE — Telephone Encounter (Signed)
This requires appt

## 2011-11-26 NOTE — Telephone Encounter (Signed)
Pt went to er and she did have a uti. Doesn't need to come in now.

## 2011-11-26 NOTE — ED Notes (Signed)
+  Urine Patient treated Keflex-sensitive to same-chart appended per protocol MD.

## 2011-11-28 ENCOUNTER — Other Ambulatory Visit: Payer: Self-pay | Admitting: Family Medicine

## 2011-12-24 ENCOUNTER — Other Ambulatory Visit: Payer: Self-pay | Admitting: Family Medicine

## 2011-12-30 ENCOUNTER — Other Ambulatory Visit: Payer: Self-pay | Admitting: Family Medicine

## 2012-01-13 ENCOUNTER — Ambulatory Visit: Payer: Medicare Other | Admitting: Family Medicine

## 2012-01-22 ENCOUNTER — Ambulatory Visit: Payer: Medicare Other | Admitting: Family Medicine

## 2012-01-23 ENCOUNTER — Other Ambulatory Visit: Payer: Self-pay | Admitting: Family Medicine

## 2012-01-23 ENCOUNTER — Ambulatory Visit (INDEPENDENT_AMBULATORY_CARE_PROVIDER_SITE_OTHER): Payer: Medicare Other

## 2012-01-23 ENCOUNTER — Ambulatory Visit: Payer: Medicare Other | Admitting: Family Medicine

## 2012-01-23 DIAGNOSIS — Z23 Encounter for immunization: Secondary | ICD-10-CM

## 2012-01-24 ENCOUNTER — Other Ambulatory Visit: Payer: Self-pay | Admitting: Family Medicine

## 2012-02-22 ENCOUNTER — Other Ambulatory Visit: Payer: Self-pay | Admitting: Family Medicine

## 2012-02-24 ENCOUNTER — Other Ambulatory Visit: Payer: Self-pay | Admitting: Family Medicine

## 2012-03-27 ENCOUNTER — Telehealth: Payer: Self-pay | Admitting: Family Medicine

## 2012-03-27 ENCOUNTER — Other Ambulatory Visit: Payer: Self-pay

## 2012-03-27 MED ORDER — HYDROCODONE-ACETAMINOPHEN 10-325 MG PO TABS: 1.0000 | ORAL_TABLET | Freq: Four times a day (QID) | ORAL | Status: DC | PRN

## 2012-03-27 NOTE — Telephone Encounter (Signed)
Med refilled and awaiting signature  

## 2012-03-27 NOTE — Telephone Encounter (Signed)
Med recently faxed.

## 2012-04-13 ENCOUNTER — Other Ambulatory Visit: Payer: Self-pay | Admitting: Family Medicine

## 2012-04-13 ENCOUNTER — Encounter: Payer: Self-pay | Admitting: Family Medicine

## 2012-04-13 ENCOUNTER — Ambulatory Visit (INDEPENDENT_AMBULATORY_CARE_PROVIDER_SITE_OTHER): Payer: Medicare Other | Admitting: Family Medicine

## 2012-04-13 VITALS — BP 146/68 | HR 80 | Resp 18 | Ht 63.5 in | Wt 120.0 lb

## 2012-04-13 DIAGNOSIS — F341 Dysthymic disorder: Secondary | ICD-10-CM

## 2012-04-13 DIAGNOSIS — J40 Bronchitis, not specified as acute or chronic: Secondary | ICD-10-CM

## 2012-04-13 DIAGNOSIS — R7301 Impaired fasting glucose: Secondary | ICD-10-CM

## 2012-04-13 DIAGNOSIS — F32A Depression, unspecified: Secondary | ICD-10-CM

## 2012-04-13 DIAGNOSIS — E210 Primary hyperparathyroidism: Secondary | ICD-10-CM

## 2012-04-13 DIAGNOSIS — E559 Vitamin D deficiency, unspecified: Secondary | ICD-10-CM

## 2012-04-13 DIAGNOSIS — F329 Major depressive disorder, single episode, unspecified: Secondary | ICD-10-CM

## 2012-04-13 DIAGNOSIS — Z139 Encounter for screening, unspecified: Secondary | ICD-10-CM

## 2012-04-13 DIAGNOSIS — E785 Hyperlipidemia, unspecified: Secondary | ICD-10-CM

## 2012-04-13 DIAGNOSIS — M549 Dorsalgia, unspecified: Secondary | ICD-10-CM

## 2012-04-13 DIAGNOSIS — Z0000 Encounter for general adult medical examination without abnormal findings: Secondary | ICD-10-CM

## 2012-04-13 DIAGNOSIS — R0989 Other specified symptoms and signs involving the circulatory and respiratory systems: Secondary | ICD-10-CM | POA: Insufficient documentation

## 2012-04-13 DIAGNOSIS — H9190 Unspecified hearing loss, unspecified ear: Secondary | ICD-10-CM

## 2012-04-13 DIAGNOSIS — I10 Essential (primary) hypertension: Secondary | ICD-10-CM

## 2012-04-13 MED ORDER — PROMETHAZINE-DM 6.25-15 MG/5ML PO SYRP: ORAL_SOLUTION | ORAL | Status: AC

## 2012-04-13 MED ORDER — HYDROCODONE-ACETAMINOPHEN 10-325 MG PO TABS: ORAL_TABLET | ORAL | Status: DC

## 2012-04-13 MED ORDER — ALPRAZOLAM 0.5 MG PO TABS: ORAL_TABLET | ORAL | Status: DC

## 2012-04-13 NOTE — Progress Notes (Signed)
Subjective:    Patient ID: Carla [name], female    DOB: 1945-08-19, 67 y.o.   MRN: [id number]  HPI Preventive Screening-Counseling & Management   Patient present here today for a Medicare annual wellness visit.   Current Problems (verified)   Medications Prior to Visit Allergies (verified)   PAST HISTORY  Family History: 6 siblings, 2 deceased, family h/o diabetes,  No MI, no stroke or cancer  Social History Married, mother of 3 living children, one deceased MI in his late 54's Current nicotine 10 per day, no alcohol or street drug use Worked in [employer] x 10 years, has been disabled x approx 7 years   Risk Factors  Current exercise habits:  Inconsistent, needs to change this  Dietary issues discussed:Low fat, low carb diet rich in vegetable   Cardiac risk factors:   Depression Screen  (Note: if answer to either of the following is "Yes", a more complete depression screening is indicated)   Over the past two weeks, have you felt down, depressed or hopeless? No  Over the past two weeks, have you felt little interest or pleasure in doing things? No  Have you lost interest or pleasure in daily life? No  Do you often feel hopeless? No  Do you cry easily over simple problems? No   Activities of Daily Living  In your present state of health, do you have any difficulty performing the following activities?  Driving?: No Managing money?: No Feeding yourself?:No Getting from bed to chair?:No Climbing a flight of stairs?:No Preparing food and eating?:No Bathing or showering?:No Getting dressed?:No Getting to the toilet?:No Using the toilet?:No Moving around from place to place?: No  Fall Risk Assessment In the past year have you fallen or had a near fall?:No Are you currently taking any medications that make you dizziness?:No   Hearing Difficulties: No Do you often ask people to speak up or repeat themselves?:yes Do you experience ringing or  noises in your ears?:yes Do you have difficulty understanding soft or whispered voices?:yes Cognitive Testing  Alert? Yes Normal Appearance?Yes  Oriented to person? Yes Place? Yes  Time? Yes  Displays appropriate judgment?Yes  Can read the correct time from a watch face? yes Are you having problems remembering things?No  Advanced Directives have been discussed with the patient?Yes , full code   List the Names of Other Physician/Practitioners you currently use: Dr Orest Dikes any recent Medical Services you may have received from other than [facility] providers in the past year (date may be approximate).   Assessment:    Annual Wellness Exam   Plan:    During the course of the visit the patient was educated and counseled about appropriate screening and preventive services including:  A healthy diet is rich in fruit, vegetables and whole grains. Poultry fish, nuts and beans are a healthy choice for protein rather then red meat. A low sodium diet and drinking 64 ounces of water daily is generally recommended. Oils and sweet should be limited. Carbohydrates especially for those who are diabetic or overweight, should be limited to 30-45 gram per meal. It is important to eat on a regular schedule, at least 3 times daily. Snacks should be primarily fruits, vegetables or nuts. It is important that you exercise regularly at least 30 minutes 5 times a week. If you develop chest pain, have severe difficulty breathing, or feel very tired, stop exercising immediately and seek medical attention  Immunization reviewed and updated. Cancer screening reviewed  and updated    Patient Instructions (the written plan) was given to the patient.  Medicare Attestation  I have personally reviewed:  The patient's medical and social history  Their use of alcohol, tobacco or illicit drugs  Their current medications and supplements  The patient's functional ability including ADLs,fall risks, home safety  risks, cognitive, and hearing and visual impairment  Diet and physical activities  Evidence for depression or mood disorders  The patient's weight, height, BMI, and visual acuity have been recorded in the chart. I have made referrals, counseling, and provided education to the patient based on review of the above and I have provided the patient with a written personalized care plan for preventive services.      Review of Systems     Objective:   Physical Exam        Assessment & Plan:

## 2012-04-13 NOTE — Patient Instructions (Addendum)
F/U in 4 month, pls call if you need me before  You NEED fasting labs as soon as possible, they are past due, cbc, cmp, hBa1C, TSH, vit , lipids. You NEED Mammogram, this needs to be scheduled at checkout You are reffer for hearing evalaution also for carotid doppler study.  You NEED to stop smoking , as well as to follow a diet with less sweets and carbs as well as low in fatty foods  Reduced dosage to 3 times daily on xanax and pain meds, starting today. as discussed, pharmacy will be notified  You are referred for hearing evaluation and for doppler ultrasound

## 2012-04-14 DIAGNOSIS — G8929 Other chronic pain: Secondary | ICD-10-CM | POA: Insufficient documentation

## 2012-04-14 NOTE — Assessment & Plan Note (Signed)
Annual wellness completed as documented. Reports hearing loss and lightheadedness, referred for hearing eval and carotid doppler. Willing to reduce nicotine, wants to quit. Reduction in pain and anxiety meds discussed and agreed upon, potential adverse side effects were discussed also Mammogram and labs need to be done

## 2012-04-16 LAB — LIPID PANEL
HDL: 62 mg/dL (ref >39)
LDL Cholesterol: 80 mg/dL (ref 0–99)
Triglycerides: 130 mg/dL (ref <150)
VLDL: 26 mg/dL (ref 0–40)

## 2012-04-16 LAB — COMPREHENSIVE METABOLIC PANEL
AST: 19 U/L (ref 0–37)
Albumin: 5.1 g/dL (ref 3.5–5.2)
Alkaline Phosphatase: 107 U/L (ref 39–117)
BUN: 9 mg/dL (ref 6–23)
Creat: 0.74 mg/dL (ref 0.50–1.10)
Glucose, Bld: 107 mg/dL — ABNORMAL HIGH (ref 70–99)
Total Bilirubin: 0.4 mg/dL (ref 0.3–1.2)

## 2012-04-16 LAB — CBC
HCT: 51.7 % — ABNORMAL HIGH (ref 36.0–46.0)
Hemoglobin: 17.8 g/dL — ABNORMAL HIGH (ref 12.0–15.0)
MCH: 30.9 pg (ref 26.0–34.0)
MCHC: 34.4 g/dL (ref 30.0–36.0)
MCV: 89.8 fL (ref 78.0–100.0)
RDW: 14.5 % (ref 11.5–15.5)

## 2012-04-17 ENCOUNTER — Other Ambulatory Visit: Payer: Self-pay | Admitting: Family Medicine

## 2012-04-17 DIAGNOSIS — R7989 Other specified abnormal findings of blood chemistry: Secondary | ICD-10-CM

## 2012-04-20 ENCOUNTER — Ambulatory Visit (HOSPITAL_COMMUNITY)
Admission: RE | Admit: 2012-04-20 | Discharge: 2012-04-20 | Disposition: A | Discharge status: Home / Self Care | Payer: Medicare Other | Source: Ambulatory Visit | Attending: Family Medicine | Admitting: Family Medicine

## 2012-04-20 DIAGNOSIS — I6529 Occlusion and stenosis of unspecified carotid artery: Secondary | ICD-10-CM | POA: Insufficient documentation

## 2012-04-20 DIAGNOSIS — R0989 Other specified symptoms and signs involving the circulatory and respiratory systems: Secondary | ICD-10-CM

## 2012-04-20 DIAGNOSIS — F17200 Nicotine dependence, unspecified, uncomplicated: Secondary | ICD-10-CM | POA: Insufficient documentation

## 2012-04-20 DIAGNOSIS — Z139 Encounter for screening, unspecified: Secondary | ICD-10-CM

## 2012-04-20 DIAGNOSIS — I10 Essential (primary) hypertension: Secondary | ICD-10-CM | POA: Insufficient documentation

## 2012-04-20 DIAGNOSIS — Z1231 Encounter for screening mammogram for malignant neoplasm of breast: Secondary | ICD-10-CM | POA: Insufficient documentation

## 2012-04-23 ENCOUNTER — Ambulatory Visit (INDEPENDENT_AMBULATORY_CARE_PROVIDER_SITE_OTHER): Payer: Medicare Other | Admitting: Otolaryngology

## 2012-04-23 ENCOUNTER — Telehealth: Payer: Self-pay

## 2012-04-23 ENCOUNTER — Other Ambulatory Visit: Payer: Self-pay

## 2012-04-23 DIAGNOSIS — H903 Sensorineural hearing loss, bilateral: Secondary | ICD-10-CM

## 2012-04-23 DIAGNOSIS — H9319 Tinnitus, unspecified ear: Secondary | ICD-10-CM

## 2012-04-23 MED ORDER — ERGOCALCIFEROL 1.25 MG (50000 UT) PO CAPS: 50000.0000 [IU] | ORAL_CAPSULE | ORAL | Status: DC

## 2012-04-23 NOTE — Telephone Encounter (Signed)
pls advise her she will need to stretch out the meds and take as prescribed which is three per day, no early refills will be sent in, she needs to work on this as we had discussed

## 2012-04-24 NOTE — Telephone Encounter (Signed)
Patient aware.

## 2012-04-27 ENCOUNTER — Encounter (HOSPITAL_COMMUNITY): Payer: Medicare Other | Attending: Oncology | Admitting: Oncology

## 2012-04-27 ENCOUNTER — Encounter (HOSPITAL_COMMUNITY): Payer: Self-pay | Admitting: Oncology

## 2012-04-27 VITALS — BP 140/77 | HR 82 | Temp 97.6°F | Resp 18 | Ht 61.75 in | Wt 119.3 lb

## 2012-04-27 DIAGNOSIS — I10 Essential (primary) hypertension: Secondary | ICD-10-CM | POA: Insufficient documentation

## 2012-04-27 DIAGNOSIS — D751 Secondary polycythemia: Secondary | ICD-10-CM | POA: Insufficient documentation

## 2012-04-27 DIAGNOSIS — M810 Age-related osteoporosis without current pathological fracture: Secondary | ICD-10-CM

## 2012-04-27 DIAGNOSIS — R634 Abnormal weight loss: Secondary | ICD-10-CM

## 2012-04-27 DIAGNOSIS — J438 Other emphysema: Secondary | ICD-10-CM | POA: Insufficient documentation

## 2012-04-27 DIAGNOSIS — D582 Other hemoglobinopathies: Secondary | ICD-10-CM

## 2012-04-27 DIAGNOSIS — I2510 Atherosclerotic heart disease of native coronary artery without angina pectoris: Secondary | ICD-10-CM | POA: Insufficient documentation

## 2012-04-27 DIAGNOSIS — I739 Peripheral vascular disease, unspecified: Secondary | ICD-10-CM | POA: Insufficient documentation

## 2012-04-27 DIAGNOSIS — J449 Chronic obstructive pulmonary disease, unspecified: Secondary | ICD-10-CM

## 2012-04-27 LAB — CBC WITH DIFFERENTIAL/PLATELET
Hemoglobin: 17 g/dL — ABNORMAL HIGH (ref 12.0–15.0)
Lymphs Abs: 4.4 10*3/uL — ABNORMAL HIGH (ref 0.7–4.0)
Monocytes Relative: 6 % (ref 3–12)
Neutro Abs: 3.9 10*3/uL (ref 1.7–7.7)
Neutrophils Relative %: 44 % (ref 43–77)
RBC: 5.43 MIL/uL — ABNORMAL HIGH (ref 3.87–5.11)

## 2012-04-27 LAB — URINALYSIS, ROUTINE W REFLEX MICROSCOPIC
Glucose, UA: NEGATIVE mg/dL
Ketones, ur: NEGATIVE mg/dL
Leukocytes, UA: NEGATIVE
Protein, ur: NEGATIVE mg/dL

## 2012-04-27 LAB — COMPREHENSIVE METABOLIC PANEL
Albumin: 4.4 g/dL (ref 3.5–5.2)
Alkaline Phosphatase: 99 U/L (ref 39–117)
BUN: 15 mg/dL (ref 6–23)
Chloride: 103 mEq/L (ref 96–112)
Glucose, Bld: 102 mg/dL — ABNORMAL HIGH (ref 70–99)
Potassium: 4.4 mEq/L (ref 3.5–5.1)
Total Bilirubin: 0.3 mg/dL (ref 0.3–1.2)

## 2012-04-27 LAB — BLOOD GAS, ARTERIAL
pCO2 arterial: 38 mmHg (ref 35.0–45.0)
pO2, Arterial: 67.8 mmHg — ABNORMAL LOW (ref 80.0–100.0)

## 2012-04-27 LAB — URINE MICROSCOPIC-ADD ON

## 2012-04-27 NOTE — Patient Instructions (Signed)
[facility] Discharge Instructions  RECOMMENDATIONS MADE BY THE CONSULTANT AND ANY TEST RESULTS WILL BE SENT TO YOUR REFERRING PHYSICIAN.  EXAM FINDINGS BY THE PHYSICIAN TODAY AND SIGNS OR SYMPTOMS TO REPORT TO CLINIC OR PRIMARY PHYSICIAN: Exam and discussion by MD.  We need to do some additional tests to see what is going on.  We will check some blood work and will do some scans.  MEDICATIONS PRESCRIBED:  none  INSTRUCTIONS GIVEN AND DISCUSSED: CT instructions given  SPECIAL INSTRUCTIONS/FOLLOW-UP: Return in 2 weeks for follow-up.  Thank you for choosing [facility] to provide your oncology and hematology care.  To afford each patient quality time with our providers, please arrive at least 15 minutes before your scheduled appointment time.  With your help, our goal is to use those 15 minutes to complete the necessary work-up to ensure our physicians have the information they need to help with your evaluation and healthcare recommendations.    Effective January 1st, 2014, we ask that you re-schedule your appointment with our physicians should you arrive 10 or more minutes late for your appointment.  We strive to give you quality time with our providers, and arriving late affects you and other patients whose appointments are after yours.    Again, thank you for choosing [facility]  Our hope is that these requests will decrease the amount of time that you wait before being seen by our physicians.       _____________________________________________________________  Should you have questions after your visit to [facility], please contact our office at [phone] between the hours of 8:30 a.m. and 5:00 p.m.  Voicemails left after 4:30 p.m. will not be returned until the following business day.  For prescription refill requests, have your pharmacy contact our office with your prescription refill request.

## 2012-04-27 NOTE — Progress Notes (Signed)
Carla [name] presented for labwork. Labs per MD order drawn via Peripheral Line 23 gauge needle inserted in left AC  - 4 tubes drawn,  Line clotted then right AC accessed and 2 tubes drawn  Good blood return present. Procedure without incident.  Needle removed intact. Patient tolerated procedure well.

## 2012-04-27 NOTE — Progress Notes (Signed)
Problem number 1  elevation in her hemoglobin to a value of 17.8 g and a hematocrit of 51.7% as of 04/16/2012. Her hemoglobin however has been elevated for at least 3 years going back to 12/28/2008 with a level at that time of 17.2 g. Her white count and platelets remain in the normal range. Problem #2 weight loss of greater than 10% of her body weight within the last 6-8 months. She would 135 pounds in the past and now weighs 119 pounds. She has a decrease her appetite as well. Problem #3 ASCVD with peripheral vascular disease in the past status post arterial stents placed many years ago Problem #4 hypercalcemia mild not on oral calcium but she does take vitamin D Problem #5 hyperlipidemia Problem #6 hypertension Problem #7 COPD having smoked three fourths of a cigarette pack per day starting at age 81. She quit for 5-6 months 20 years ago. Problem #8 osteoporosis  Pleasant 67 year old woman whose retired. She's been a smoker for many years and gets short of breath walking well under a half mile. She cannot exercise well. She gets short winded doing her housework at times. She also gets short winded laying down at night. She is not cough up blood. She does not think she is coughing more. Her weight is however down 16 pounds in 6-8 months. Appetite is also diminished. She is not aware of lumps or bumps anywhere. She is up-to-date on mammography and colonoscopy she states.  She has no blood in her stools but is chronically constipated. She has no urinary symptoms. She has no history of heart disease. She's not had melanoma is removed but the scar left upper back is from an infected sebaceous cyst it sounds like.  Oncology review of systems otherwise noncontributory. She has not had fevers chills or night sweats.  She is accompanied by her sister-in-law Carla [name] who is our patient and a niece who lives in [place].  BP 140/77  Pulse 82  Temp 97.6 F (36.4 C) (Oral)  Resp 18  Ht 5' 1.75" (1.568  m)  Wt 119 lb 4.8 oz (54.114 kg)  BMI 22.00 kg/m2  She is in no acute distress but has skin changes on her face consistent with out of an active smoker. She has tobacco on her breath. She has an upper dental plate. Pupils are equally round and reactive to light. There appear to be early cataracts bilaterally. EOMs appear intact. Throat is slightly hyperemic as is her tongue. She has no obvious thyromegaly. She has no obvious lymphadenopathy in cervical, supraclavicular, infraclavicular, axillary or inguinal areas. She has not had epitrochlear lymphadenopathy. Lungs show hyperresonance to percussion throughout and markedly diminished breath sounds and rhonchi bilaterally worse on the right than the left. There no rales or rubs. Heart shows a regular rhythm and rate with distant heart sounds. Breast exam is negative for masses. Abdomen does not reveal hepatosplenomegaly or masses. She has numerous scars her abdomen. All are well-healed. Bowel sounds are diminished. She has no peripheral edema but decreased pulses in both feet. She is no skin lesions suggestive of skin cancer   She needs an extensive workup for the weight loss and the elevated hemoglobin. I suspect that her elevated hemoglobin revolves around her lung disease but I am not sure what is the cause of her weight loss. See her back after a number of tests including erythropoietin level, CAT scans, urinalysis, blood gases and point function studies.  ADD: Her blood gases are now back  and revealed a PO2 of 67.8 mm mercury and a PCO2 38 mm mercury with a pH of 7.39. Her carbon monoxide hemoglobin value is very high at 7.2%.!

## 2012-04-28 LAB — ERYTHROPOIETIN: Erythropoietin: 10.4 m[IU]/mL (ref 2.6–18.5)

## 2012-04-30 ENCOUNTER — Ambulatory Visit (HOSPITAL_COMMUNITY)
Admission: RE | Admit: 2012-04-30 | Discharge: 2012-04-30 | Disposition: A | Discharge status: Home / Self Care | Payer: Medicare Other | Source: Ambulatory Visit | Attending: Oncology | Admitting: Oncology

## 2012-04-30 DIAGNOSIS — R0602 Shortness of breath: Secondary | ICD-10-CM | POA: Insufficient documentation

## 2012-04-30 DIAGNOSIS — R634 Abnormal weight loss: Secondary | ICD-10-CM

## 2012-04-30 DIAGNOSIS — D582 Other hemoglobinopathies: Secondary | ICD-10-CM

## 2012-04-30 LAB — JAK2 GENOTYPR: JAK2 GenotypR: NOT DETECTED

## 2012-04-30 MED ORDER — ALBUTEROL SULFATE (5 MG/ML) 0.5% IN NEBU
2.5000 mg | INHALATION_SOLUTION | Freq: Once | RESPIRATORY_TRACT | Status: AC
  Administered 2012-04-30: 2.5 mg via RESPIRATORY_TRACT

## 2012-05-01 ENCOUNTER — Ambulatory Visit (HOSPITAL_COMMUNITY)
Admission: RE | Admit: 2012-05-01 | Discharge: 2012-05-01 | Disposition: A | Discharge status: Home / Self Care | Payer: Medicare Other | Source: Ambulatory Visit | Attending: Oncology | Admitting: Oncology

## 2012-05-01 ENCOUNTER — Other Ambulatory Visit (HOSPITAL_COMMUNITY): Payer: Self-pay | Admitting: Oncology

## 2012-05-01 DIAGNOSIS — D582 Other hemoglobinopathies: Secondary | ICD-10-CM

## 2012-05-01 DIAGNOSIS — J4489 Other specified chronic obstructive pulmonary disease: Secondary | ICD-10-CM | POA: Insufficient documentation

## 2012-05-01 DIAGNOSIS — J449 Chronic obstructive pulmonary disease, unspecified: Secondary | ICD-10-CM | POA: Insufficient documentation

## 2012-05-01 DIAGNOSIS — R7989 Other specified abnormal findings of blood chemistry: Secondary | ICD-10-CM | POA: Insufficient documentation

## 2012-05-01 DIAGNOSIS — R634 Abnormal weight loss: Secondary | ICD-10-CM

## 2012-05-01 MED ORDER — IOHEXOL 300 MG/ML  SOLN: 100.0000 mL | Freq: Once | INTRAMUSCULAR | Status: AC | PRN

## 2012-05-02 NOTE — Procedures (Signed)
NAME:  Carla [name], Carla [name]              ACCOUNT NO.:  [id number]  MEDICAL RECORD NO.:  [id number]  LOCATION:  RAD                           FACILITY:  [facility]  PHYSICIAN:  Donda Friedli L. Juanetta Gosling, M.D.DATE OF BIRTH:  09-26-1945  DATE OF PROCEDURE:  05/01/2012 DATE OF DISCHARGE:  05/01/2012                           PULMONARY FUNCTION TEST   REASON FOR PULMONARY FUNCTION TESTING:  Elevated hemoglobin and shortness of breath.  1. Spirometry shows a mild-to-moderate ventilatory defect with     evidence of airflow obstruction at the level of the smaller     airways. 2. Lung volumes show mild air trapping. 3. DLCO is moderately reduced, but does correct somewhat when     ventilation is taken into account. 4. Airway resistance is normal, suggesting the airflow obstruction is     not significant. 5. There is no significant bronchodilator improvement.     Daryl Beehler L. Juanetta Gosling, M.D.     ELH/MEDQ  D:  05/02/2012  T:  05/02/2012  Job:  [id number]

## 2012-05-09 ENCOUNTER — Other Ambulatory Visit: Payer: Self-pay | Admitting: Family Medicine

## 2012-05-11 ENCOUNTER — Telehealth: Payer: Self-pay | Admitting: Family Medicine

## 2012-05-11 ENCOUNTER — Encounter (HOSPITAL_BASED_OUTPATIENT_CLINIC_OR_DEPARTMENT_OTHER): Payer: Medicare Other | Admitting: Oncology

## 2012-05-11 VITALS — BP 175/69 | HR 80 | Temp 97.9°F | Resp 20 | Wt 123.2 lb

## 2012-05-11 DIAGNOSIS — J449 Chronic obstructive pulmonary disease, unspecified: Secondary | ICD-10-CM

## 2012-05-11 DIAGNOSIS — F17200 Nicotine dependence, unspecified, uncomplicated: Secondary | ICD-10-CM

## 2012-05-11 DIAGNOSIS — D751 Secondary polycythemia: Secondary | ICD-10-CM

## 2012-05-11 LAB — PULMONARY FUNCTION TEST

## 2012-05-11 MED ORDER — NICOTINE 21 MG/24HR TD PT24: 1.0000 | MEDICATED_PATCH | TRANSDERMAL | Status: DC

## 2012-05-11 MED ORDER — ROSUVASTATIN CALCIUM 40 MG PO TABS: ORAL_TABLET | ORAL | Status: DC

## 2012-05-11 NOTE — Progress Notes (Signed)
Diagnosis number 1 secondary erythrocytosis due to COPD. She has early emphysema but no cancer seen on her CAT scans. Her carbon monoxide hemoglobin level was quite high. She still had an elevated erythropoietin level relative to her hemoglobin value. She was Jak-2 negative. She needs to quit smoking and wants to try the Nicoderm patches which we have called in to her pharmacy. She will start with 21 mg for one to 2 months and then will contact us about the 14 mg patches for one to 2 months and then will followup with [place] for the 7 mg patches for one to 2 months. She may even need 7 mg patches every other day for a while if need be.  The good news is that her husband does not smoke. She is accompanied today by her sister-in-law  We need to check her blood counts in 3 months and we'll see how she is doing at that time with the Nicoderm patches. I think we also need to ask her about her followup of her parathyroid hormone level since it has not been evaluated that we could find since 2011.

## 2012-05-11 NOTE — Patient Instructions (Addendum)
[facility] Discharge Instructions  RECOMMENDATIONS MADE BY THE CONSULTANT AND ANY TEST RESULTS WILL BE SENT TO YOUR REFERRING PHYSICIAN.  EXAM FINDINGS BY THE PHYSICIAN TODAY AND SIGNS OR SYMPTOMS TO REPORT TO CLINIC OR PRIMARY PHYSICIAN: Discussion by MD.  Scans were good but you need to stop smoking.   MEDICATIONS PRESCRIBED:  Nicoderm patch - use as directed.     SPECIAL INSTRUCTIONS/FOLLOW-UP: Return for blood work and follow-up in 3 months.  Thank you for choosing [facility] to provide your oncology and hematology care.  To afford each patient quality time with our providers, please arrive at least 15 minutes before your scheduled appointment time.  With your help, our goal is to use those 15 minutes to complete the necessary work-up to ensure our physicians have the information they need to help with your evaluation and healthcare recommendations.    Effective January 1st, 2014, we ask that you re-schedule your appointment with our physicians should you arrive 10 or more minutes late for your appointment.  We strive to give you quality time with our providers, and arriving late affects you and other patients whose appointments are after yours.    Again, thank you for choosing [facility].  Our hope is that these requests will decrease the amount of time that you wait before being seen by our physicians.       _____________________________________________________________  Should you have questions after your visit to [facility], please contact our office at [phone] between the hours of 8:30 a.m. and 5:00 p.m.  Voicemails left after 4:30 p.m. will not be returned until the following business day.  For prescription refill requests, have your pharmacy contact our office with your prescription refill request.

## 2012-05-11 NOTE — Telephone Encounter (Signed)
Refill sent in

## 2012-05-12 ENCOUNTER — Telehealth: Payer: Self-pay | Admitting: Family Medicine

## 2012-05-12 DIAGNOSIS — E210 Primary hyperparathyroidism: Secondary | ICD-10-CM

## 2012-05-12 NOTE — Telephone Encounter (Signed)
Please order PTH level, let pt know she needs this , send a copy to Dr Mariel Sleet , her level was elevated in the past thanks

## 2012-05-13 NOTE — Addendum Note (Signed)
Addended by: Abner Greenspan on: 05/13/2012 03:38 PM   Modules accepted: Orders

## 2012-05-13 NOTE — Telephone Encounter (Signed)
Patient aware and will have labs done  

## 2012-05-20 LAB — PTH, INTACT AND CALCIUM: Calcium, Total (PTH): 10.3 mg/dL (ref 8.4–10.5)

## 2012-05-21 ENCOUNTER — Other Ambulatory Visit: Payer: Self-pay | Admitting: Family Medicine

## 2012-05-21 DIAGNOSIS — E215 Disorder of parathyroid gland, unspecified: Secondary | ICD-10-CM

## 2012-06-11 ENCOUNTER — Telehealth: Payer: Self-pay | Admitting: Family Medicine

## 2012-06-11 ENCOUNTER — Other Ambulatory Visit: Payer: Self-pay | Admitting: Family Medicine

## 2012-06-11 MED ORDER — PANTOPRAZOLE SODIUM 20 MG PO TBEC: 20.0000 mg | DELAYED_RELEASE_TABLET | Freq: Every day | ORAL | Status: DC

## 2012-06-11 MED ORDER — BUDESONIDE-FORMOTEROL FUMARATE 80-4.5 MCG/ACT IN AERO: 2.0000 | INHALATION_SPRAY | Freq: Two times a day (BID) | RESPIRATORY_TRACT | Status: DC

## 2012-06-11 NOTE — Telephone Encounter (Signed)
Pls find out if she has sputum production and record, also fever or chills. If neither, cough likely due to irritation from uncontrolled allergy symptoms or reflux No reflux med noted on her record. She needs one or the other or both. Needs to sched appt to be seen next week to further address, non available for this week Meds are being sent in for reflux and COPD pls let her know

## 2012-06-11 NOTE — Telephone Encounter (Signed)
Patient presented in office complaining of cough x 1 month.  States that this started when she stopped smoking.  She states that cough is productive.  Please advise.

## 2012-06-11 NOTE — Telephone Encounter (Signed)
Patient reports that she only has white sputum to yellow tinged. She scheduled appointment to come in on next week and is aware of new medications sent.

## 2012-06-17 ENCOUNTER — Ambulatory Visit: Payer: Medicare Other | Admitting: Family Medicine

## 2012-06-20 ENCOUNTER — Encounter (HOSPITAL_COMMUNITY): Payer: Self-pay | Admitting: *Deleted

## 2012-06-20 ENCOUNTER — Emergency Department (HOSPITAL_COMMUNITY): Payer: Medicare Other

## 2012-06-20 ENCOUNTER — Emergency Department (HOSPITAL_COMMUNITY)
Admission: EM | Admit: 2012-06-20 | Discharge: 2012-06-20 | Disposition: A | Discharge status: Home / Self Care | Payer: Medicare Other | Attending: Emergency Medicine | Admitting: Emergency Medicine

## 2012-06-20 DIAGNOSIS — F3289 Other specified depressive episodes: Secondary | ICD-10-CM | POA: Insufficient documentation

## 2012-06-20 DIAGNOSIS — Z79899 Other long term (current) drug therapy: Secondary | ICD-10-CM | POA: Insufficient documentation

## 2012-06-20 DIAGNOSIS — I10 Essential (primary) hypertension: Secondary | ICD-10-CM | POA: Insufficient documentation

## 2012-06-20 DIAGNOSIS — Z8739 Personal history of other diseases of the musculoskeletal system and connective tissue: Secondary | ICD-10-CM | POA: Insufficient documentation

## 2012-06-20 DIAGNOSIS — R059 Cough, unspecified: Secondary | ICD-10-CM | POA: Insufficient documentation

## 2012-06-20 DIAGNOSIS — Z8639 Personal history of other endocrine, nutritional and metabolic disease: Secondary | ICD-10-CM | POA: Insufficient documentation

## 2012-06-20 DIAGNOSIS — R509 Fever, unspecified: Secondary | ICD-10-CM | POA: Insufficient documentation

## 2012-06-20 DIAGNOSIS — Z87891 Personal history of nicotine dependence: Secondary | ICD-10-CM | POA: Insufficient documentation

## 2012-06-20 DIAGNOSIS — R05 Cough: Secondary | ICD-10-CM | POA: Insufficient documentation

## 2012-06-20 DIAGNOSIS — K219 Gastro-esophageal reflux disease without esophagitis: Secondary | ICD-10-CM | POA: Insufficient documentation

## 2012-06-20 DIAGNOSIS — Z8709 Personal history of other diseases of the respiratory system: Secondary | ICD-10-CM | POA: Insufficient documentation

## 2012-06-20 DIAGNOSIS — I739 Peripheral vascular disease, unspecified: Secondary | ICD-10-CM | POA: Insufficient documentation

## 2012-06-20 DIAGNOSIS — Z7982 Long term (current) use of aspirin: Secondary | ICD-10-CM | POA: Insufficient documentation

## 2012-06-20 DIAGNOSIS — Z862 Personal history of diseases of the blood and blood-forming organs and certain disorders involving the immune mechanism: Secondary | ICD-10-CM | POA: Insufficient documentation

## 2012-06-20 DIAGNOSIS — F329 Major depressive disorder, single episode, unspecified: Secondary | ICD-10-CM | POA: Insufficient documentation

## 2012-06-20 DIAGNOSIS — I2510 Atherosclerotic heart disease of native coronary artery without angina pectoris: Secondary | ICD-10-CM | POA: Insufficient documentation

## 2012-06-20 MED ORDER — ZOLPIDEM TARTRATE 5 MG PO TABS: 5.0000 mg | ORAL_TABLET | Freq: Every evening | ORAL | Status: DC | PRN

## 2012-06-20 MED ORDER — PREDNISONE 20 MG PO TABS: ORAL_TABLET | ORAL | Status: DC

## 2012-06-20 MED ORDER — HYDROCODONE-HOMATROPINE 5-1.5 MG/5ML PO SYRP: 5.0000 mL | ORAL_SOLUTION | Freq: Four times a day (QID) | ORAL | Status: DC | PRN

## 2012-06-20 MED ORDER — AZITHROMYCIN 250 MG PO TABS: ORAL_TABLET | ORAL | Status: DC

## 2012-06-20 NOTE — ED Notes (Signed)
Pt c/o for the past few weeks with mucus production color is sometimes white and sometimes yellow. Denies any fever, cough became worse last night.

## 2012-06-20 NOTE — ED Provider Notes (Signed)
History  This chart was scribed for Donnetta Hutching, MD by Erskine Emery, ED Scribe. This patient was seen in room [facility]18[facility] and the patient's care was started at 12:15.   CSN: [id number]  Arrival date & time 06/20/12  1129   First MD Initiated Contact with Patient 06/20/12 1215      Chief Complaint  Patient presents with  . Cough    (Consider location/radiation/quality/duration/timing/severity/associated sxs/prior treatment) The history is provided by the patient. No language interpreter was used.  Carla [name] is a 67 y.o. female who presents to the Emergency Department complaining of  constant coughing and mild chest tenderness for the past month and a half, since she quit smoking. Pt reports some associated hot flashes and sleep disturbance from coughing. Pt reports she quit smoking because she had a lung CT that showed a non-cancerous spot on her lung. Pt reports she is on a blood pressure pill. Pt does not work anymore.  [name] is the pt's PCP.  Past Medical History  Diagnosis Date  . ASCVD (arteriosclerotic cardiovascular disease)     No critical disease on cath in 8/97: mild AI with trival, if any l AS; negative stress nuclear in 2010  . Peripheral vascular disease     stauts post aortabifemoral graft    . Tobacco abuse 2010    Continue at 1/2 pack per day   . Chronic bronchitis   . Hypertension   . Fasting hyperglycemia   . Elevated hemoglobin A1c   . Hydronephrosis   . GERD (gastroesophageal reflux disease)   . Back pain   . Depression   . Osteoporosis     Past Surgical History  Procedure Laterality Date  . Cholecystectomy    . Aorta bifem graft    . Right kidney surgery following damage during arterial surgery    . Partial hysterectomy    . Total abdominal hysterectomy w/ bilateral salpingoophorectomy  1975  . Lysis of adhesions  2000  . Abdominal hysterectomy      Family History  Problem Relation Age of Onset  . Heart attack Mother   . Heart  attack Father   . COPD Sister   . Diabetes Sister   . Diabetes Sister   . Coronary artery disease Sister   . Diabetes Sister   . Heart disease Sister   . COPD Sister     History  Substance Use Topics  . Smoking status: Former Smoker -- 0.50 packs/day    Types: Cigarettes  . Smokeless tobacco: Never Used  . Alcohol Use: No    OB History   Grav Para Term Preterm Abortions TAB SAB Ect Mult Living                  Review of Systems A complete 10 system review of systems was obtained and all systems are negative except as noted in the HPI and PMH.    Allergies  Review of patient's allergies indicates no known allergies.  Home Medications   Current Outpatient Rx  Name  Route  Sig  Dispense  Refill  . ALPRAZolam (XANAX) 0.5 MG tablet      Dose reduction effective 04/13/2012 One tablet three times daily   90 tablet   3   . amLODipine (NORVASC) 5 MG tablet      TAKE (1) TABLET BY MOUTH DAILY.   90 tablet   3   . aspirin (ASPIRIN LOW DOSE) 81 MG EC tablet   Oral  Take 81 mg by mouth daily. Take one tablet by mouth once daily          . budesonide-formoterol (SYMBICORT) 80-4.5 MCG/ACT inhaler   Inhalation   Inhale 2 puffs into the lungs 2 (two) times daily.   1 Inhaler   2   . Docusate Calcium (STOOL SOFTENER PO)   Oral   Take 1 capsule by mouth 2 (two) times daily.         . ergocalciferol (VITAMIN D2) 50000 UNITS capsule   Oral   Take 1 capsule (50,000 Units total) by mouth once a week.   4 capsule   6   . HYDROcodone-acetaminophen (NORCO) 10-325 MG per tablet      One tablet every 8 hours, as needed, for back pain. Reduced dose effective 04/13/2012   90 tablet   3   . nicotine (NICODERM CQ) 21 mg/24hr patch   Transdermal   Place 1 patch onto the skin daily.   28 patch   1   . pantoprazole (PROTONIX) 20 MG tablet   Oral   Take 1 tablet (20 mg total) by mouth daily.   30 tablet   1   . PAXIL 20 MG tablet      TAKE 1 TABLET BY MOUTH  DAILY.   30 tablet   4   . promethazine-dextromethorphan (PROMETHAZINE-DM) 6.25-15 MG/5ML syrup      One teaspoon at bedtime ,a s needed, for chronic cough   240 mL   0   . rosuvastatin (CRESTOR) 40 MG tablet      TAKE (1) TABLET BY MOUTH DAILY AT BEDTIME.   30 tablet   4     Triage Vitals: BP 141/61  Pulse 99  Temp(Src) 98.3 F (36.8 C) (Oral)  Resp 20  Ht 5\' 3"  (1.6 m)  Wt 125 lb (56.7 kg)  BMI 22.15 kg/m2  SpO2 96%  Physical Exam  Nursing note and vitals reviewed. Constitutional: She is oriented to person, place, and time. She appears well-developed and well-nourished.  HENT:  Head: Normocephalic and atraumatic.  Eyes: Conjunctivae and EOM are normal. Pupils are equal, round, and reactive to light.  Neck: Normal range of motion. Neck supple.  Cardiovascular: Normal rate, regular rhythm and normal heart sounds.   Pulmonary/Chest: Effort normal and breath sounds normal.  Abdominal: Soft. Bowel sounds are normal.  Musculoskeletal: Normal range of motion.  Neurological: She is alert and oriented to person, place, and time.  Skin: Skin is warm and dry.  Psychiatric: She has a normal mood and affect.    ED Course  Procedures (including critical care time) DIAGNOSTIC STUDIES: Oxygen Saturation is 96% on room air, adequate by my interpretation.    COORDINATION OF CARE: 12:46--I evaluated the patient and we discussed a treatment plan including Prednisone, cough medicine, and antibiotic to which the pt agreed. Pt requests some sleep medication.  Results for orders placed in visit on 05/12/12  PTH, INTACT AND CALCIUM      Result Value Range   PTH 130.2 (*) 14.0 - 72.0 pg/mL   Calcium, Total (PTH) 10.3  8.4 - 10.5 mg/dL   Dg Chest 2 View  1/61/0960  *RADIOLOGY REPORT*  Clinical Data: Cough, fever, shortness of breath, chills.  Ex- smoker.  CHEST - 2 VIEW  Comparison: Previous examinations.  Findings: The heart remains normal in size.  The lungs remain hyperexpanded  and clear with minimally prominent interstitial markings.  Diffuse osteopenia.  Mild thoracic spine degenerative changes and  minimal scoliosis.  Upper abdominal surgical clips compatible with previous cholecystectomy.  IMPRESSION: Stable changes of COPD.  No acute abnormality.   Original Report Authenticated By: Beckie Salts, M.D.       No diagnosis found.    MDM  Patient is hemodynamically stable. Patient could have mycoplasma.   Rx Z-Pak, prednisone, Hycodan cough syrup, Ambien for sleep      I personally performed the services described in this documentation, which was scribed in my presence. The recorded information has been reviewed and is accurate.    Donnetta Hutching, MD 06/20/12 1336

## 2012-06-25 ENCOUNTER — Other Ambulatory Visit (HOSPITAL_COMMUNITY): Payer: Self-pay | Admitting: "Endocrinology

## 2012-06-25 DIAGNOSIS — E349 Endocrine disorder, unspecified: Secondary | ICD-10-CM

## 2012-06-25 DIAGNOSIS — Z13820 Encounter for screening for osteoporosis: Secondary | ICD-10-CM

## 2012-07-01 ENCOUNTER — Other Ambulatory Visit (HOSPITAL_COMMUNITY): Payer: Medicare Other

## 2012-07-08 ENCOUNTER — Telehealth: Payer: Self-pay | Admitting: Family Medicine

## 2012-07-08 NOTE — Telephone Encounter (Signed)
Pt only prediabetic. Does she need to be testing her sugar?

## 2012-07-08 NOTE — Telephone Encounter (Signed)
pls ex[plain since she is not diabetic , insurance will not cover this so she can buy oTC meter and strips, no need for prescription,. She does need to keep regular appts in the office and get the HBA1C done every 4 months however, pls remiind her of this

## 2012-07-15 NOTE — Telephone Encounter (Signed)
Letter sent.

## 2012-08-03 ENCOUNTER — Other Ambulatory Visit (HOSPITAL_COMMUNITY): Payer: Medicare Other

## 2012-08-05 ENCOUNTER — Encounter (HOSPITAL_COMMUNITY): Payer: Medicare Other | Attending: Oncology | Admitting: Oncology

## 2012-08-05 ENCOUNTER — Other Ambulatory Visit (HOSPITAL_COMMUNITY): Payer: Self-pay | Admitting: Oncology

## 2012-08-05 ENCOUNTER — Encounter (HOSPITAL_COMMUNITY): Payer: Self-pay | Admitting: Oncology

## 2012-08-05 VITALS — BP 144/60 | HR 81 | Temp 97.5°F | Resp 18 | Wt 132.0 lb

## 2012-08-05 DIAGNOSIS — R05 Cough: Secondary | ICD-10-CM

## 2012-08-05 DIAGNOSIS — F17200 Nicotine dependence, unspecified, uncomplicated: Secondary | ICD-10-CM | POA: Insufficient documentation

## 2012-08-05 DIAGNOSIS — J984 Other disorders of lung: Secondary | ICD-10-CM | POA: Insufficient documentation

## 2012-08-05 DIAGNOSIS — J4489 Other specified chronic obstructive pulmonary disease: Secondary | ICD-10-CM | POA: Insufficient documentation

## 2012-08-05 DIAGNOSIS — D751 Secondary polycythemia: Secondary | ICD-10-CM | POA: Insufficient documentation

## 2012-08-05 DIAGNOSIS — J449 Chronic obstructive pulmonary disease, unspecified: Secondary | ICD-10-CM

## 2012-08-05 DIAGNOSIS — R059 Cough, unspecified: Secondary | ICD-10-CM

## 2012-08-05 HISTORY — DX: Secondary polycythemia: D75.1

## 2012-08-05 LAB — CBC WITH DIFFERENTIAL/PLATELET
Eosinophils Absolute: 0.2 10*3/uL (ref 0.0–0.7)
Lymphocytes Relative: 39 % (ref 12–46)
Lymphs Abs: 3.1 10*3/uL (ref 0.7–4.0)
Neutro Abs: 3.9 10*3/uL (ref 1.7–7.7)
Neutrophils Relative %: 50 % (ref 43–77)
Platelets: 227 10*3/uL (ref 150–400)
RBC: 4.87 MIL/uL (ref 3.87–5.11)
WBC: 7.8 10*3/uL (ref 4.0–10.5)

## 2012-08-05 MED ORDER — FLUTICASONE-SALMETEROL 250-50 MCG/DOSE IN AEPB: 1.0000 | INHALATION_SPRAY | Freq: Two times a day (BID) | RESPIRATORY_TRACT | Status: DC

## 2012-08-05 NOTE — Patient Instructions (Addendum)
.  [facility] Discharge Instructions  RECOMMENDATIONS MADE BY THE CONSULTANT AND ANY TEST RESULTS WILL BE SENT TO YOUR REFERRING PHYSICIAN.  EXAM FINDINGS BY THE PHYSICIAN TODAY AND SIGNS OR SYMPTOMS TO REPORT TO CLINIC OR PRIMARY PHYSICIAN: education given today on erythrocytosis    INSTRUCTIONS GIVEN AND DISCUSSED: Labs today and then we will call you if we need to do phlebotomy  SPECIAL INSTRUCTIONS/FOLLOW-UP: 3 month f/u  Thank you for choosing [facility] to provide your oncology and hematology care.  To afford each patient quality time with our providers, please arrive at least 15 minutes before your scheduled appointment time.  With your help, our goal is to use those 15 minutes to complete the necessary work-up to ensure our physicians have the information they need to help with your evaluation and healthcare recommendations.    Effective January 1st, 2014, we ask that you re-schedule your appointment with our physicians should you arrive 10 or more minutes late for your appointment.  We strive to give you quality time with our providers, and arriving late affects you and other patients whose appointments are after yours.    Again, thank you for choosing [facility].  Our hope is that these requests will decrease the amount of time that you wait before being seen by our physicians.       _____________________________________________________________  Should you have questions after your visit to [facility], please contact our office at [phone] between the hours of 8:30 a.m. and 5:00 p.m.  Voicemails left after 4:30 p.m. will not be returned until the following business day.  For prescription refill requests, have your pharmacy contact our office with your prescription refill request.

## 2012-08-05 NOTE — Progress Notes (Signed)
Carla Overman, MD [address] [place] [address]  Secondary erythrocytosis - Plan: CBC with Differential  Emphysema with chronic bronchitis - Plan: CBC with Differential, Fluticasone-Salmeterol (ADVAIR DISKUS) 250-50 MCG/DOSE AEPB  TOBACCO ABUSE - Plan: CBC with Differential, Fluticasone-Salmeterol (ADVAIR DISKUS) 250-50 MCG/DOSE AEPB  Erythrocytosis due to pulmonary disease - Plan: CBC with Differential  CURRENT THERAPY: Work-up.  INTERVAL HISTORY: Carla [name] 67 y.o. female returns for  regular  visit for followup of secondary erythrocytosis due to COPD   Carla [name] is doing well.  She quit smoking 4 months ago.  She reports that she feels better since quitting smoking.  She reports that she continues to have cravings and has been able to subdue the cravings thus far.  Hopefully that improves as time continues and she is able to refrain from smoking.  She was congratulated on this accomplishment which is huge for her.   I provided the patient education regarding her diagnosis of secondary erythrocytosis.  We discussed the etiology and pathophysiology of this disorder.  We discussed that with her erythrocytosis she is at increased risk of cardiovascular events.  As a result, we discussed treatment which will include therapeutic phlebotomies per lab results.  I provided her with so much information today, that will defer treatment goals to our next visit.    She reports a cough that is productive of clear/yellow sputum.  She denies any fevers or chills,  She denies any other URI-like symptoms.  As a result, I will provide her with an Advair diskus.  I do not know the cost of this and she was asked to not pick up the Rx if it is too expensive and if that is the case, she was recommended to follow-up with PCP.  The diskus was chosen because the patient admits to difficulty with inhalers. She was offered a device to help with proper inhalation, namely an inhaler chamber and she  reports that er difficulty is with the inhalation process often cause her to cough and therefore she does not get all of the medication appropriately.  Thus, a diskus is a better choice for her.  She reports that she is following up with Dr. Fransico Him, Endocrinologist, who follows her hyperparathyroid level.  Hematologically, she denies any complaints and ROS questioning is negative.   Past Medical History  Diagnosis Date  . ASCVD (arteriosclerotic cardiovascular disease)     No critical disease on cath in 8/97: mild AI with trival, if any l AS; negative stress nuclear in 2010  . Peripheral vascular disease     stauts post aortabifemoral graft    . Tobacco abuse 2010    Continue at 1/2 pack per day   . Chronic bronchitis   . Hypertension   . Fasting hyperglycemia   . Elevated hemoglobin A1c   . Hydronephrosis   . GERD (gastroesophageal reflux disease)   . Back pain   . Depression   . Osteoporosis   . Secondary erythrocytosis 08/05/2012    Secondary to COPD    has Primary hyperparathyroidism; VITAMIN D DEFICIENCY; HYPERLIPIDEMIA; HYPERCALCEMIA; Anxiety and depression; TOBACCO ABUSE; HYPERTENSION; Arteriosclerotic cardiovascular disease (ASCVD); PERIPHERAL VASCULAR DISEASE; ALLERGIC RHINITIS, SEASONAL; GASTROESOPHAGEAL REFLUX DISEASE; BACK PAIN, CHRONIC; OSTEOPOROSIS; FASTING HYPERGLYCEMIA; Emphysema with chronic bronchitis; Sinusitis; Bronchitis; Back pain with radiation; Hearing loss; Carotid bruit; Routine general medical examination at a health care facility; and Secondary erythrocytosis on her problem list.     has No Known Allergies.  [name] had no medications  administered during this visit.  Past Surgical History  Procedure Laterality Date  . Cholecystectomy    . Aorta bifem graft    . Right kidney surgery following damage during arterial surgery    . Partial hysterectomy    . Total abdominal hysterectomy w/ bilateral salpingoophorectomy  1975  . Lysis of adhesions  2000   . Abdominal hysterectomy      Denies any headaches, dizziness, double vision, fevers, chills, night sweats, nausea, vomiting, diarrhea, constipation, chest pain, heart palpitations, shortness of breath, blood in stool, black tarry stool, urinary pain, urinary burning, urinary frequency, hematuria.   PHYSICAL EXAMINATION  ECOG PERFORMANCE STATUS: 1 - Symptomatic but completely ambulatory  Filed Vitals:   08/05/12 1126  BP: 144/60  Pulse: 81  Temp: 97.5 F (36.4 C)  Resp: 18    GENERAL:alert, no distress, well nourished, well developed, comfortable, cooperative, smiling and chronically ill appearing. SKIN: skin color, texture, turgor are normal, no rashes or significant lesions HEAD: Normocephalic, No masses, lesions, tenderness or abnormalities EYES: normal, Conjunctiva are pink and non-injected EARS: External ears normal OROPHARYNX:mucous membranes are moist  NECK: supple, no adenopathy, thyroid normal size, non-tender, without nodularity, no stridor, non-tender, trachea midline LYMPH:  no palpable lymphadenopathy BREAST:not examined LUNGS: clear to auscultation and percussion, decreased breath sounds HEART: regular rate & rhythm, no murmurs, no gallops, S1 normal and S2 normal ABDOMEN:abdomen soft, non-tender and normal bowel sounds BACK: Back symmetric, no curvature. EXTREMITIES:less then 2 second capillary refill, no joint deformities, effusion, or inflammation, no edema, no skin discoloration, no clubbing, no cyanosis  NEURO: alert & oriented x 3 with fluent speech, no focal motor/sensory deficits, gait normal    LABORATORY DATA: CBC    Component Value Date/Time   WBC 9.0 04/27/2012 1633   RBC 5.43* 04/27/2012 1633   HGB 17.0* 04/27/2012 1633   HCT 50.2* 04/27/2012 1633   PLT 215 04/27/2012 1633   MCV 92.4 04/27/2012 1633   MCH 31.3 04/27/2012 1633   MCHC 33.9 04/27/2012 1633   RDW 14.5 04/27/2012 1633   LYMPHSABS 4.4* 04/27/2012 1633   MONOABS 0.6 04/27/2012 1633   EOSABS 0.1  04/27/2012 1633   BASOSABS 0.1 04/27/2012 1633     RADIOGRAPHIC STUDIES:  05/01/2012  *RADIOLOGY REPORT*  Clinical Data: Weight loss with elevated serum hemoglobin levels.  History of COPD and peripheral vascular disease. Evaluate for  occult malignancy.  CT CHEST, ABDOMEN AND PELVIS WITHOUT CONTRAST  Technique: Multidetector CT imaging of the chest, abdomen and  pelvis was performed following the standard protocol without IV  contrast. Contrast was not administered as IV access could not be  obtained.  Comparison: Chest CT 07/30/2004. Abdominal pelvic CT 08/23/2008.  CT CHEST  Findings: There are no enlarged mediastinal, hilar or axillary  lymph nodes. The thyroid gland appears unremarkable. There is  diffuse atherosclerosis of the aorta, great vessels and coronary  arteries.  There is no pleural or pericardial effusion. Tiny right lung  nodules are noted within the middle lobe (image 40) and the lower  lobe (image 31). These are unchanged from the prior study. No new  or enlarging pulmonary nodules are identified. Mild emphysematous  changes are noted. There are no worrisome osseous findings.  IMPRESSION:  1. No evidence of thoracic malignancy.  2. Stable atherosclerosis, mild emphysema and small right lung  nodules from 2006.  CT ABDOMEN AND PELVIS  Findings: As evaluated in the noncontrast state, the liver,  spleen, pancreas and adrenal glands appear unremarkable. There  is  stable biliary dilatation status post cholecystectomy. There is a  stable extrarenal pelvis on the right and mild renal cortical  thinning. No focal renal lesion is identified.  There are stable postsurgical changes status post aortobifemoral  grafting. No enlarged abdominal pelvic lymph nodes are identified.  The stomach is poorly distended without apparent abnormality. No  abnormalities of the small bowel or colon are seen. The bladder is  nearly empty without demonstrated abnormality. The uterus is   surgically absent. There is no adnexal mass.  There are no worrisome osseous findings.  IMPRESSION:  1. Stable abdominal pelvic CT. No evidence of malignancy or acute  process.  2. Stable biliary dilatation status post cholecystectomy.  3. Stable renal cortical thinning.  Original Report Authenticated By: Carey Bullocks, M.D.     ASSESSMENT:  1. Secondary erythrocytosis due to COPD, Jak2 negative.   2. COPD, with elevated carbon monoxide hemoglobin level. 3. Elevated erythropoetin level, relative to Hemoglobin level 4. Tobacco abuse, negative CT of chest looking for malignancy.  Quite Jan 2014. 5. Cough, secondary to #4. 6. Hyperparathyroidism, followed by Endocrinologist, Dr. Fransico Him.  Patient Active Problem List   Diagnosis Date Noted  . Secondary erythrocytosis 08/05/2012  . Routine general medical examination at a health care facility 04/14/2012  . Hearing loss 04/13/2012  . Carotid bruit 04/13/2012  . Back pain with radiation 10/15/2011  . Sinusitis 03/05/2011  . Bronchitis 03/05/2011  . Emphysema with chronic bronchitis 01/17/2011  . FASTING HYPERGLYCEMIA 09/18/2009  . TOBACCO ABUSE 09/17/2009  . Arteriosclerotic cardiovascular disease (ASCVD) 09/17/2009  . PERIPHERAL VASCULAR DISEASE 09/17/2009  . GASTROESOPHAGEAL REFLUX DISEASE 09/17/2009  . Primary hyperparathyroidism 01/20/2009  . VITAMIN D DEFICIENCY 01/20/2009  . HYPERCALCEMIA 12/30/2008  . ALLERGIC RHINITIS, SEASONAL 09/07/2007  . HYPERLIPIDEMIA 06/12/2007  . Anxiety and depression 06/12/2007  . HYPERTENSION 06/12/2007  . BACK PAIN, CHRONIC 06/12/2007  . OSTEOPOROSIS 06/12/2007     PLAN:  1. I personally reviewed and went over laboratory results with the patient. 2. I personally reviewed and went over radiographic studies with the patient. 3. Labs today: CBC diff 4. Depending on labs result, will schedule for phlebotomy. 5. Will develop lab schedule pending results from today 6. Patient encouraged to  continuing smoking cessation 7. Patient education regarding her diagnosis and its treatment. 8. Rx for Advair diskus.  If too expensive, recommend follow-up with PCP.  Recommend future refills from PCP or other treatment options. Future treatment and follow-up of COPD will be deferred to PCP. 9. Will discuss treatment goals on next follow-up appointment.  10. Return in 3 months for follow-up.   All questions were answered. The patient knows to call the clinic with any problems, questions or concerns. We can certainly see the patient much sooner if necessary.  The patient and plan discussed with Glenford Peers, MD and he is in agreement with the aforementioned.  I spent 25 minutes counseling the patient face to face. The total time spent in the appointment was 35 minutes.  Corwyn Vora

## 2012-08-11 ENCOUNTER — Encounter: Payer: Self-pay | Admitting: Family Medicine

## 2012-08-11 ENCOUNTER — Ambulatory Visit (INDEPENDENT_AMBULATORY_CARE_PROVIDER_SITE_OTHER): Payer: Medicare Other | Admitting: Family Medicine

## 2012-08-11 VITALS — BP 122/70 | HR 97 | Resp 16 | Ht 61.75 in | Wt 132.0 lb

## 2012-08-11 DIAGNOSIS — F17200 Nicotine dependence, unspecified, uncomplicated: Secondary | ICD-10-CM

## 2012-08-11 DIAGNOSIS — I10 Essential (primary) hypertension: Secondary | ICD-10-CM

## 2012-08-11 DIAGNOSIS — F419 Anxiety disorder, unspecified: Secondary | ICD-10-CM

## 2012-08-11 DIAGNOSIS — F32A Depression, unspecified: Secondary | ICD-10-CM

## 2012-08-11 DIAGNOSIS — M549 Dorsalgia, unspecified: Secondary | ICD-10-CM

## 2012-08-11 DIAGNOSIS — F341 Dysthymic disorder: Secondary | ICD-10-CM

## 2012-08-11 DIAGNOSIS — E785 Hyperlipidemia, unspecified: Secondary | ICD-10-CM

## 2012-08-11 DIAGNOSIS — J40 Bronchitis, not specified as acute or chronic: Secondary | ICD-10-CM

## 2012-08-11 MED ORDER — AMLODIPINE BESYLATE 5 MG PO TABS: ORAL_TABLET | ORAL | Status: DC

## 2012-08-11 MED ORDER — PENICILLIN V POTASSIUM 500 MG PO TABS: 500.0000 mg | ORAL_TABLET | Freq: Three times a day (TID) | ORAL | Status: DC

## 2012-08-11 MED ORDER — ROSUVASTATIN CALCIUM 40 MG PO TABS: ORAL_TABLET | ORAL | Status: DC

## 2012-08-11 MED ORDER — BENZONATATE 100 MG PO CAPS: 100.0000 mg | ORAL_CAPSULE | Freq: Four times a day (QID) | ORAL | Status: DC | PRN

## 2012-08-11 MED ORDER — ALPRAZOLAM 0.5 MG PO TABS: ORAL_TABLET | ORAL | Status: DC

## 2012-08-11 MED ORDER — HYDROCODONE-ACETAMINOPHEN 10-325 MG PO TABS: ORAL_TABLET | ORAL | Status: DC

## 2012-08-11 MED ORDER — BUPROPION HCL ER (SR) 150 MG PO TB12: 150.0000 mg | ORAL_TABLET | Freq: Two times a day (BID) | ORAL | Status: DC

## 2012-08-11 NOTE — Patient Instructions (Addendum)
CPE in 3 month, call if you need me before.  Please cut back on paxil 20mg  , break in half take half tablet once daily for 1 week, then half tablet 3 times per week for 1 week , then stop. Start wellbutrin one twice daily for depression and anxiety and to help with smoking cessation.Otto Herb on smoking cessation.You have reduced your risk of every type of cancer as wekll as heart disease and stroke  Medication is prescribed for bronchitis.  Fasting lipid and cmp in 3 month  No other changes in your chronic medication, please bring all medication to next visit

## 2012-08-11 NOTE — Progress Notes (Signed)
  Subjective:    Patient ID: Carla [name], female    DOB: 1945/05/25, 67 y.o.   MRN: [id number]  HPI The PT is here for follow up and re-evaluation of chronic medical conditions, medication management and review of any available recent lab and radiology data.  Preventive health is updated, specifically  Cancer screening and Immunization.   Questions or concerns regarding consultations or procedures which the PT has had in the interim are  addressed. The PT denies any adverse reactions to current medications since the last visit.  Recently quit smoking, but has c/o chronic fatigue, wants help with this. Not suicidal or homicidal, just feels low C/o chest congestion with increased sputum x 1 week, occasional chills    Review of Systems See HPI Denies recent fever or chills. Denies sinus pressure, nasal congestion, ear pain or sore throat.  Denies chest pains, palpitations and leg swelling Denies abdominal pain, nausea, vomiting,diarrhea or constipation.   Denies dysuria, frequency, hesitancy or incontinence. .chronic back pain unchanged Denies headaches, seizures, numbness, or tingling. Denies skin break down or rash.        Objective:   Physical Exam  Patient alert and oriented and in no cardiopulmonary distress.  HEENT: No facial asymmetry, EOMI, no sinus tenderness,  oropharynx pink and moist.  Neck supple no adenopathy.  Chest: decreased though adequate air entry, scattered crackles, few wheezes  CVS: S1, S2 no murmurs, no S3.  ABD: Soft non tender. Bowel sounds normal.  Ext: No edema  MS: Adequate though decreased  ROM spine, shoulders, hips and knees.  Skin: Intact, no ulcerations or rash noted.  Psych: Good eye contact, normal affect. Memory intact not anxious or depressed appearing.  CNS: CN 2-12 intact, power, tone and sensation normal throughout.       Assessment & Plan:

## 2012-09-02 ENCOUNTER — Other Ambulatory Visit (HOSPITAL_COMMUNITY): Payer: Medicare Other

## 2012-09-06 NOTE — Assessment & Plan Note (Signed)
Pt to change from paxil to wellbutrin , to help with nicotine cravings now that she has quit, a well as to  Treat her depression, which is sub optimally controlled at this time. He is not suicidal or homicidal, c/o chronic fatigue

## 2012-09-06 NOTE — Assessment & Plan Note (Signed)
Unchanged, continue chronic pain meds as before 

## 2012-09-06 NOTE — Assessment & Plan Note (Signed)
Controlled, no change in medication DASH diet and commitment to daily physical activity for a minimum of 30 minutes discussed and encouraged, as a part of hypertension management. The importance of attaining a healthy weight is also discussed.  

## 2012-09-06 NOTE — Assessment & Plan Note (Signed)
Decongestant and antibiotic prescribed 

## 2012-09-06 NOTE — Assessment & Plan Note (Addendum)
Updated lab is past due , pt educated once more about the imporance of getting labs ordered, she is on a statin, and has CVD  Uncontrolled in January  Hyperlipidemia:Low fat diet discussed and encouraged.

## 2012-09-09 ENCOUNTER — Telehealth: Payer: Self-pay | Admitting: Family Medicine

## 2012-09-09 NOTE — Telephone Encounter (Signed)
Noted and agree with decisions taken

## 2012-09-09 NOTE — Telephone Encounter (Signed)
Spoke with patient and since she was unable to come in Friday for ab appt that was offered, I told her that if somebody called and cancelled tomorrow then we would call her to schedule and that Dr Lodema Hong was going to be out of the office next week

## 2012-09-09 NOTE — Telephone Encounter (Signed)
Want to work her in Thursday?

## 2012-09-15 ENCOUNTER — Emergency Department (HOSPITAL_COMMUNITY): Payer: Medicare Other

## 2012-09-15 ENCOUNTER — Emergency Department (HOSPITAL_COMMUNITY)
Admission: EM | Admit: 2012-09-15 | Discharge: 2012-09-15 | Disposition: A | Discharge status: Home / Self Care | Payer: Medicare Other | Attending: Emergency Medicine | Admitting: Emergency Medicine

## 2012-09-15 ENCOUNTER — Encounter (HOSPITAL_COMMUNITY): Payer: Self-pay

## 2012-09-15 DIAGNOSIS — R05 Cough: Secondary | ICD-10-CM | POA: Insufficient documentation

## 2012-09-15 DIAGNOSIS — F329 Major depressive disorder, single episode, unspecified: Secondary | ICD-10-CM | POA: Insufficient documentation

## 2012-09-15 DIAGNOSIS — Z8679 Personal history of other diseases of the circulatory system: Secondary | ICD-10-CM | POA: Insufficient documentation

## 2012-09-15 DIAGNOSIS — R079 Chest pain, unspecified: Secondary | ICD-10-CM | POA: Insufficient documentation

## 2012-09-15 DIAGNOSIS — M810 Age-related osteoporosis without current pathological fracture: Secondary | ICD-10-CM | POA: Insufficient documentation

## 2012-09-15 DIAGNOSIS — F3289 Other specified depressive episodes: Secondary | ICD-10-CM | POA: Insufficient documentation

## 2012-09-15 DIAGNOSIS — Z8639 Personal history of other endocrine, nutritional and metabolic disease: Secondary | ICD-10-CM | POA: Insufficient documentation

## 2012-09-15 DIAGNOSIS — Z79899 Other long term (current) drug therapy: Secondary | ICD-10-CM | POA: Insufficient documentation

## 2012-09-15 DIAGNOSIS — G8929 Other chronic pain: Secondary | ICD-10-CM | POA: Insufficient documentation

## 2012-09-15 DIAGNOSIS — Z8719 Personal history of other diseases of the digestive system: Secondary | ICD-10-CM | POA: Insufficient documentation

## 2012-09-15 DIAGNOSIS — R52 Pain, unspecified: Secondary | ICD-10-CM | POA: Insufficient documentation

## 2012-09-15 DIAGNOSIS — J4489 Other specified chronic obstructive pulmonary disease: Secondary | ICD-10-CM | POA: Insufficient documentation

## 2012-09-15 DIAGNOSIS — Z87891 Personal history of nicotine dependence: Secondary | ICD-10-CM | POA: Insufficient documentation

## 2012-09-15 DIAGNOSIS — I10 Essential (primary) hypertension: Secondary | ICD-10-CM | POA: Insufficient documentation

## 2012-09-15 DIAGNOSIS — Z87448 Personal history of other diseases of urinary system: Secondary | ICD-10-CM | POA: Insufficient documentation

## 2012-09-15 DIAGNOSIS — J449 Chronic obstructive pulmonary disease, unspecified: Secondary | ICD-10-CM | POA: Insufficient documentation

## 2012-09-15 DIAGNOSIS — Z7982 Long term (current) use of aspirin: Secondary | ICD-10-CM | POA: Insufficient documentation

## 2012-09-15 DIAGNOSIS — Z862 Personal history of diseases of the blood and blood-forming organs and certain disorders involving the immune mechanism: Secondary | ICD-10-CM | POA: Insufficient documentation

## 2012-09-15 DIAGNOSIS — M25511 Pain in right shoulder: Secondary | ICD-10-CM

## 2012-09-15 DIAGNOSIS — M549 Dorsalgia, unspecified: Secondary | ICD-10-CM | POA: Insufficient documentation

## 2012-09-15 DIAGNOSIS — R059 Cough, unspecified: Secondary | ICD-10-CM | POA: Insufficient documentation

## 2012-09-15 DIAGNOSIS — M25519 Pain in unspecified shoulder: Secondary | ICD-10-CM | POA: Insufficient documentation

## 2012-09-15 MED ORDER — METHOCARBAMOL 500 MG PO TABS: ORAL_TABLET | ORAL | Status: DC

## 2012-09-15 MED ORDER — IBUPROFEN 800 MG PO TABS
800.0000 mg | ORAL_TABLET | Freq: Once | ORAL | Status: AC
  Administered 2012-09-15: 800 mg via ORAL
  Filled 2012-09-15: qty 1

## 2012-09-15 MED ORDER — CYCLOBENZAPRINE HCL 10 MG PO TABS
10.0000 mg | ORAL_TABLET | Freq: Once | ORAL | Status: DC
  Filled 2012-09-15: qty 1

## 2012-09-15 NOTE — ED Provider Notes (Signed)
History  This chart was scribed for Carla Givens, MD by [name], ED Scribe. This patient was seen in room APA11/[facility]11 and the patient's care was started at 4:41 PM.  CSN: [id number] Arrival date & time 09/15/12  1422   First MD Initiated Contact with Patient 09/15/12 1641     Chief Complaint  Patient presents with  . Chest Pain  . Cough    Patient is a 67 y.o. female presenting with shoulder pain. The history is provided by the patient. No language interpreter was used.  Shoulder Pain This is a new problem. The current episode started more than 2 days ago. Episode frequency: intermittently. The problem has not changed since onset.Pertinent negatives include no chest pain and no shortness of breath. Exacerbated by: movement of right arm. Nothing relieves the symptoms. Treatments tried: pain medication. The treatment provided no relief.    HPI Comments: Carla [name] is a 67 y.o. female who presents to the Emergency Department complaining of one week of sudden onset, intermittently felt right posterior shoulder pain. Movement of the right arm and sudden movements aggravate the pain. She denies any recent injury or alleviating factors. She reports that she took "one of my husband's pain pills" which allowed her to sleep but she is also on hydrocodone pills for chronic back pain with no improvement. Pt states that the pain would "move around" occasionally after stopping suddenly. She states that the pain has been felt in her right leg, left shoulder and right arm as well but is mostly center in the right shoulder. Pt denies having prior episodes of similar symptoms  Or prior arthritis diagnoses. She reports a family h/o arthritis with her mother but denies RA. She also reports has had a productive cough with yellowish colored sputum since stopping smoking 5 months ago. She denies SOB, fevers, joint swelling, CP and nausea as associated symptoms. She denies alcohol use. She denies any new  trauma or injury  PCP is [name] Pt denies seeing any other specialists   Past Medical History  Diagnosis Date  . ASCVD (arteriosclerotic cardiovascular disease)     No critical disease on cath in 8/97: mild AI with trival, if any l AS; negative stress nuclear in 2010  . Peripheral vascular disease     stauts post aortabifemoral graft    . Tobacco abuse 2010    Continue at 1/2 pack per day   . Chronic bronchitis   . Hypertension   . Fasting hyperglycemia   . Elevated hemoglobin A1c   . Hydronephrosis   . GERD (gastroesophageal reflux disease)   . Back pain   . Depression   . Osteoporosis   . Secondary erythrocytosis 08/05/2012    Secondary to COPD   Past Surgical History  Procedure Laterality Date  . Cholecystectomy    . Aorta bifem graft    . Right kidney surgery following damage during arterial surgery    . Partial hysterectomy    . Total abdominal hysterectomy w/ bilateral salpingoophorectomy  1975  . Lysis of adhesions  2000  . Abdominal hysterectomy     Family History  Problem Relation Age of Onset  . Heart attack Mother   . Heart attack Father   . COPD Sister   . Diabetes Sister   . Diabetes Sister   . Coronary artery disease Sister   . Diabetes Sister   . Heart disease Sister   . COPD Sister    History  Substance Use Topics  .  Smoking status: Former Smoker -- 0.50 packs/day    Types: Cigarettes  . Smokeless tobacco: Never Used  . 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  Review of Systems  Constitutional: Negative for fever.  Respiratory: Positive for cough. Negative for shortness of breath.   Cardiovascular: Negative for chest pain.  Gastrointestinal: Negative for nausea.  Musculoskeletal: Positive for back pain (chronic) and arthralgias (right shoulder). Negative for joint swelling.  All other systems reviewed and are negative.    Allergies  Review of patient's allergies indicates no known allergies.  Home Medications    Current Outpatient Rx  Name  Route  Sig  Dispense  Refill  . ALPRAZolam (XANAX) 0.5 MG tablet   Oral   Take 0.5 mg by mouth 3 (three) times daily as needed for anxiety.         Marland Kitchen amLODipine (NORVASC) 5 MG tablet   Oral   Take 5 mg by mouth daily.         Marland Kitchen aspirin (ASPIRIN LOW DOSE) 81 MG EC tablet   Oral   Take 81 mg by mouth daily.          Marland Kitchen buPROPion (WELLBUTRIN SR) 150 MG 12 hr tablet   Oral   Take 1 tablet (150 mg total) by mouth 2 (two) times daily.   60 tablet   4   . docusate sodium (COLACE) 100 MG capsule   Oral   Take 100 mg by mouth 2 (two) times daily.         . ergocalciferol (VITAMIN D2) 50000 UNITS capsule   Oral   Take 1 capsule (50,000 Units total) by mouth once a week.   4 capsule   6   . HYDROcodone-acetaminophen (NORCO) 10-325 MG per tablet   Oral   Take 1 tablet by mouth every 8 (eight) hours as needed for pain. One tablet every 8 hours, as needed, for back pain. Reduced dose effective 04/13/2012         . Multiple Vitamin (MULTIVITAMIN WITH MINERALS) TABS   Oral   Take 1 tablet by mouth every evening. MULTIVITAMIN-OTC to promote LOWER CHOLESTEROL         . rosuvastatin (CRESTOR) 40 MG tablet   Oral   Take 40 mg by mouth at bedtime. TAKE (1) TABLET BY MOUTH DAILY AT BEDTIME.          Triage Vitals: BP 161/76  Pulse 87  Temp(Src) 97.2 F (36.2 C) (Oral)  Resp 20  Ht 5\' 4"  (1.626 m)  Wt 131 lb (59.421 kg)  BMI 22.47 kg/m2  SpO2 98%  Vital signs normal    Physical Exam  Nursing note and vitals reviewed. Constitutional: She is oriented to person, place, and time. She appears well-developed and well-nourished.  Non-toxic appearance. She does not appear ill. No distress.  HENT:  Head: Normocephalic and atraumatic.  Right Ear: External ear normal.  Left Ear: External ear normal.  Nose: Nose normal. No mucosal edema or rhinorrhea.  Mouth/Throat: Oropharynx is clear and moist and mucous membranes are normal. No dental  abscesses or edematous.  Eyes: Conjunctivae and EOM are normal. Pupils are equal, round, and reactive to light.  Neck: Normal range of motion and full passive range of motion without pain. Neck supple.  Non-tender trapezius and c-spine  Cardiovascular: Normal rate, regular rhythm and normal heart sounds.  Exam reveals no gallop and no friction rub.   No murmur heard. Pulmonary/Chest: Effort normal and  breath sounds normal. No respiratory distress. She has no wheezes. She has no rhonchi. She has no rales. She exhibits no tenderness and no crepitus.  non-tender clavicles  Abdominal: Soft. Normal appearance and bowel sounds are normal. She exhibits no distension. There is no tenderness. There is no rebound and no guarding.  Musculoskeletal: She exhibits no edema.       Back:  Tender at the proximal lateral scapula at the posterior right shoulder, pain with abduction of right arm, good distal pulses, neurovascularly intact  Neurological: She is alert and oriented to person, place, and time. She has normal strength. No cranial nerve deficit.  Skin: Skin is warm, dry and intact. No rash noted. No erythema. No pallor.  Psychiatric: She has a normal mood and affect. Her speech is normal and behavior is normal. Her mood appears not anxious.    ED Course  Procedures (including critical care time)  Medications  ibuprofen (ADVIL,MOTRIN) tablet 800 mg (800 mg Oral Given 09/15/12 1742)    DIAGNOSTIC STUDIES: Oxygen Saturation is 98% on room air, normal by my interpretation.    COORDINATION OF CARE: 5:15 PM-Informed pt of CXR results. Discussed treatment plan which includes xray of right shoulder with pt at bedside and pt agreed to plan.   6:27 PM-Informed pt of radiology results showing arthritis. Discussed discharge plan which includes ice packs, flexeril and OTC medications for pain with pt and pt agreed to plan. Also advised pt to follow up with ortho referral as needed and pt agreed. Addressed  symptoms to return for with pt.   Pharmacy called after discharge, robaxin is $21 dollars, which is her copay, states her insurance doesn't cover flexeril either.   Dg Chest 2 View  09/15/2012   *RADIOLOGY REPORT*  Clinical Data: Chest pain, cough  CHEST - 2 VIEW  Comparison: June 20, 2012.  Findings: Hyperexpansion of the lungs is noted consistent with chronic obstructive pulmonary disease.  Cardiomediastinal silhouette appears normal.  No acute pulmonary disease is noted. No pleural effusion or pneumothorax is noted.  IMPRESSION: Findings consistent with chronic obstructive pulmonary disease.  No acute abnormality seen.   Original Report Authenticated By: Lupita Raider.,  M.D.   Dg Shoulder Right  09/15/2012   *RADIOLOGY REPORT*  Clinical Data: Pain without trauma.  Question rotator cuff injury.  RIGHT SHOULDER - 2+ VIEW  Comparison: None.  Findings: Mild degenerative irregularity of the undersurface of the acromioclavicular joint.  No acute fracture or dislocation.  Visualized portion of the right hemithorax is normal.  IMPRESSION: Degenerative change, without acute osseous finding.   Original Report Authenticated By: Jeronimo Greaves, M.D.    Date: 09/16/2012  Rate: 75  Rhythm: normal sinus rhythm  QRS Axis: normal  Intervals: normal  ST/T Wave abnormalities: NSTWC  Conduction Disutrbances:IRBBB  Narrative Interpretation: PRWP  Old EKG Reviewed: unchanged from 11/30/2010    1. Shoulder pain, acute, right   2. Chronic cough     Discharge Medication List as of 09/15/2012  6:15 PM    START taking these medications   Details  methocarbamol (ROBAXIN) 500 MG tablet Take 1 or 2 po Q 6hrs for pain, Print        Plan discharge  Devoria Albe, MD, FACEP   MDM   I personally performed the services described in this documentation, which was scribed in my presence. The recorded information has been reviewed and considered.  Devoria Albe, MD, FACEP    Carla Givens, MD 09/16/12 [phone]

## 2012-09-15 NOTE — ED Notes (Signed)
Pt reports r sided chest pain that radiates into r shoulder since last week.  Pt says when she moves her arm a certain way the chest pain is worse.  Denies injury.  Also reports has had a productive cough with yellowish colored sputum since stopping smoking 6 months ago.  Denies any SOB or nausea.

## 2012-09-15 NOTE — Discharge Instructions (Signed)
Ice packs to your painful shoulder. Take the robaxin with ibuprofen 600 mg 4 times a day with your hydrocodone you already have. Call Dr Mort Sawyers office to get an appointment to have him recheck your shoulder.  Shoulder Pain The shoulder is the joint that connects your arms to your body. The bones that form the shoulder joint include the upper arm bone (humerus), the shoulder blade (scapula), and the collarbone (clavicle). The top of the humerus is shaped like a ball and fits into a rather flat socket on the scapula (glenoid cavity). A combination of muscles and strong, fibrous tissues that connect muscles to bones (tendons) support your shoulder joint and hold the ball in the socket. Small, fluid-filled sacs (bursae) are located in different areas of the joint. They act as cushions between the bones and the overlying soft tissues and help reduce friction between the gliding tendons and the bone as you move your arm. Your shoulder joint allows a wide range of motion in your arm. This range of motion allows you to do things like scratch your back or throw a ball. However, this range of motion also makes your shoulder more prone to pain from overuse and injury. Causes of shoulder pain can originate from both injury and overuse and usually can be grouped in the following four categories:  Redness, swelling, and pain (inflammation) of the tendon (tendinitis) or the bursae (bursitis).  Instability, such as a dislocation of the joint.  Inflammation of the joint (arthritis).  Broken bone (fracture). HOME CARE INSTRUCTIONS   Apply ice to the sore area.  Put ice in a plastic bag.  Place a towel between your skin and the bag.  Leave the ice on for 15-20 minutes, 3-4 times per day for the first 2 days.  Stop using cold packs if they do not help with the pain.  If you have a shoulder sling or immobilizer, wear it as long as your caregiver instructs. Only remove it to shower or bathe. Move your arm as  little as possible, but keep your hand moving to prevent swelling.  Squeeze a soft ball or foam pad as much as possible to help prevent swelling.  Only take over-the-counter or prescription medicines for pain, discomfort, or fever as directed by your caregiver. SEEK MEDICAL CARE IF:   Your shoulder pain increases, or new pain develops in your arm, hand, or fingers.  Your hand or fingers become cold and numb.  Your pain is not relieved with medicines. SEEK IMMEDIATE MEDICAL CARE IF:   Your arm, hand, or fingers are numb or tingling.  Your arm, hand, or fingers are significantly swollen or turn white or blue. MAKE SURE YOU:   Understand these instructions.  Will watch your condition.  Will get help right away if you are not doing well or get worse. Document Released: 12/19/2004 Document Revised: 12/04/2011 Document Reviewed: 02/23/2011 [facility] Patient Information 2014 [place], [place].

## 2012-09-30 ENCOUNTER — Other Ambulatory Visit (HOSPITAL_COMMUNITY): Payer: Medicare Other

## 2012-10-28 ENCOUNTER — Encounter (HOSPITAL_COMMUNITY): Payer: Medicare Other | Attending: Oncology

## 2012-10-28 DIAGNOSIS — D751 Secondary polycythemia: Secondary | ICD-10-CM | POA: Insufficient documentation

## 2012-10-28 LAB — CBC
HCT: 45.4 % (ref 36.0–46.0)
MCH: 30.5 pg (ref 26.0–34.0)
MCHC: 32.6 g/dL (ref 30.0–36.0)
MCV: 93.4 fL (ref 78.0–100.0)
RDW: 13.9 % (ref 11.5–15.5)

## 2012-10-28 NOTE — Progress Notes (Signed)
Labs drawn today for cbc 

## 2012-11-03 ENCOUNTER — Encounter: Payer: Self-pay | Admitting: Family Medicine

## 2012-11-03 ENCOUNTER — Ambulatory Visit (INDEPENDENT_AMBULATORY_CARE_PROVIDER_SITE_OTHER): Payer: Medicare Other | Admitting: Family Medicine

## 2012-11-03 VITALS — BP 146/80 | HR 84 | Resp 18 | Ht 61.75 in | Wt 137.0 lb

## 2012-11-03 DIAGNOSIS — I10 Essential (primary) hypertension: Secondary | ICD-10-CM

## 2012-11-03 DIAGNOSIS — M5090 Cervical disc disorder, unspecified, unspecified cervical region: Secondary | ICD-10-CM | POA: Insufficient documentation

## 2012-11-03 DIAGNOSIS — F329 Major depressive disorder, single episode, unspecified: Secondary | ICD-10-CM

## 2012-11-03 DIAGNOSIS — F341 Dysthymic disorder: Secondary | ICD-10-CM

## 2012-11-03 DIAGNOSIS — E785 Hyperlipidemia, unspecified: Secondary | ICD-10-CM

## 2012-11-03 MED ORDER — GABAPENTIN 300 MG PO CAPS: ORAL_CAPSULE | ORAL | Status: DC

## 2012-11-03 MED ORDER — ALPRAZOLAM 0.5 MG PO TABS: 0.5000 mg | ORAL_TABLET | Freq: Three times a day (TID) | ORAL | Status: DC | PRN

## 2012-11-03 MED ORDER — HYDROCODONE-ACETAMINOPHEN 10-325 MG PO TABS: 1.0000 | ORAL_TABLET | Freq: Three times a day (TID) | ORAL | Status: DC | PRN

## 2012-11-03 MED ORDER — PREDNISONE 5 MG PO TABS: ORAL_TABLET | ORAL | Status: DC

## 2012-11-03 NOTE — Patient Instructions (Addendum)
F/u end September, please call if you need me before  I am proud of you and happy that you have not started smoking again, keep it up!  The pain you are having is from problems in your neck, which we have known about since 2006.  You are referred for a rept MRI of your neck, and  You will be referred for evaluation by a specialist following this  Today you will get toradol, 60mg  im and depo medrol 80 mg im in the office. New medication to help wit pain, is prednisone for 9 days, follow directions, and gabapentin at bedtime  Fasting lipid, cmp  3 days before next visit please

## 2012-11-03 NOTE — Progress Notes (Signed)
  Subjective:    Patient ID: Carla [name], female    DOB: 1946/01/23, 67 y.o.   MRN: [id number]  HPI Pt in for f/u of June 2014 ED visit for left neck pain radiating to left shoulder and left upper arm, inner aspect. Pain is unrelenting and constant, unable to get any rest due to pain. No c/o upper extremity weakness or sensory loss. Known disc disease since 2006, no images since that time, no recent neck trauma   Review of Systems See HPI Denies recent fever or chills. Denies sinus pressure, nasal congestion, ear pain or sore throat. Denies chest congestion, productive cough or wheezing. Denies chest pains, palpitations and leg swelling Denies abdominal pain, nausea, vomiting,diarrhea or constipation.   Denies dysuria, frequency, hesitancy or incontinence. Denies headaches, seizures. Denies depression or  anxiety has  Insomnia due too uncontrolled pain Denies skin break down or rash.        Objective:   Physical Exam  Patient alert and oriented and in no cardiopulmonary distress.  HEENT: No facial asymmetry, EOMI, no sinus tenderness,  oropharynx pink and moist.  Neck decreased ROM with left trapezius spasm no adenopathy.  Chest: Clear to auscultation bilaterally.Decfreased though adequate air entry  CVS: S1, S2 no murmurs, no S3.  ABD: Soft non tender. Bowel sounds normal.  Ext: No edema  MS: Adequate ROM spine, shoulders, hips and knees.  Skin: Intact, no ulcerations or rash noted.  Psych: Good eye contact, normal affect. Memory intact  anxious not  depressed appearing.  CNS: CN 2-12 intact, power, tone and sensation normal throughout.       Assessment & Plan:

## 2012-11-04 ENCOUNTER — Other Ambulatory Visit: Payer: Self-pay | Admitting: Family Medicine

## 2012-11-04 ENCOUNTER — Encounter (HOSPITAL_COMMUNITY): Payer: Self-pay | Admitting: Oncology

## 2012-11-04 ENCOUNTER — Encounter (HOSPITAL_BASED_OUTPATIENT_CLINIC_OR_DEPARTMENT_OTHER): Payer: Medicare Other | Admitting: Oncology

## 2012-11-04 VITALS — BP 129/82 | HR 98 | Temp 98.2°F | Resp 16 | Wt 136.3 lb

## 2012-11-04 DIAGNOSIS — J449 Chronic obstructive pulmonary disease, unspecified: Secondary | ICD-10-CM

## 2012-11-04 DIAGNOSIS — M5090 Cervical disc disorder, unspecified, unspecified cervical region: Secondary | ICD-10-CM

## 2012-11-04 DIAGNOSIS — J438 Other emphysema: Secondary | ICD-10-CM

## 2012-11-04 DIAGNOSIS — D751 Secondary polycythemia: Secondary | ICD-10-CM

## 2012-11-04 DIAGNOSIS — Z87891 Personal history of nicotine dependence: Secondary | ICD-10-CM

## 2012-11-04 DIAGNOSIS — F17200 Nicotine dependence, unspecified, uncomplicated: Secondary | ICD-10-CM

## 2012-11-04 MED ORDER — METHYLPREDNISOLONE ACETATE 80 MG/ML IJ SUSP
80.0000 mg | Freq: Once | INTRAMUSCULAR | Status: AC
  Administered 2012-11-03: 80 mg via INTRAMUSCULAR

## 2012-11-04 MED ORDER — KETOROLAC TROMETHAMINE 60 MG/2ML IJ SOLN
60.0000 mg | Freq: Once | INTRAMUSCULAR | Status: AC
  Administered 2012-11-03: 60 mg via INTRAMUSCULAR

## 2012-11-04 NOTE — Patient Instructions (Addendum)
[facility] Discharge Instructions  RECOMMENDATIONS MADE BY THE CONSULTANT AND ANY TEST RESULTS WILL BE SENT TO YOUR REFERRING PHYSICIAN.  EXAM FINDINGS BY THE PHYSICIAN TODAY AND SIGNS OR SYMPTOMS TO REPORT TO CLINIC OR PRIMARY PHYSICIAN: exam and discussion by PA.  MEDICATIONS PRESCRIBED:  none  SPECIAL INSTRUCTIONS/FOLLOW-UP: Blood work in 3 and 6 months and follow-up in 6 months.  Thank you for choosing [facility] to provide your oncology and hematology care.  To afford each patient quality time with our providers, please arrive at least 15 minutes before your scheduled appointment time.  With your help, our goal is to use those 15 minutes to complete the necessary work-up to ensure our physicians have the information they need to help with your evaluation and healthcare recommendations.    Effective January 1st, 2014, we ask that you re-schedule your appointment with our physicians should you arrive 10 or more minutes late for your appointment.  We strive to give you quality time with our providers, and arriving late affects you and other patients whose appointments are after yours.    Again, thank you for choosing [facility].  Our hope is that these requests will decrease the amount of time that you wait before being seen by our physicians.       _____________________________________________________________  Should you have questions after your visit to [facility], please contact our office at [phone] between the hours of 8:30 a.m. and 5:00 p.m.  Voicemails left after 4:30 p.m. will not be returned until the following business day.  For prescription refill requests, have your pharmacy contact our office with your prescription refill request.

## 2012-11-04 NOTE — Progress Notes (Signed)
Carla Overman, MD [address] [place] [address]  Secondary erythrocytosis  TOBACCO ABUSE  Emphysema with chronic bronchitis  CURRENT THERAPY: Observation  INTERVAL HISTORY: Carla [name] 67 y.o. female returns for  regular  visit for followup of secondary erythrocytosis due to COPD, Jak2 negative.  She has quit smoking altogether for the last 6 months.  She denies relapsing even 1 time.  I congratulated her on this accomplishment as it is very difficult to quit smoking.  I have strongly encouraged her to continue to refrain from smoking moving forward.   Since she has quit smoking, her erythrocytosis and hemoglobinemia has resoled.  I personally reviewed and went over laboratory results with the patient.  Her Hgb on 10/28/2012 was 14.8 g/dL and HCT is 60.4%.  She does not meet requirement for phlebotomy.   With this information, we will repeat labs in 3 months and 6 months and see her back in 6 months for follow-up.  She reports right shoulder pain.  She is following up with this discomfort with her PCP, [name].  She was recently given Hydroconde 10 mg tablets by [name], but she reports that it is not helping with her shoulder pain.  She has an upcoming MRI of spine on 11/10/2012.  She requests a more powerful pain medication and I have declined.  I will defer treatment and evaluation to her PCP or specialist pending MRI results.   Hematologically, she denies any complaints and ROS questioning is negative.    Past Medical History  Diagnosis Date  . ASCVD (arteriosclerotic cardiovascular disease)     No critical disease on cath in 8/97: mild AI with trival, if any l AS; negative stress nuclear in 2010  . Peripheral vascular disease     stauts post aortabifemoral graft    . Tobacco abuse 2010    Continue at 1/2 pack per day   . Chronic bronchitis   . Hypertension   . Fasting hyperglycemia   . Elevated hemoglobin A1c   . Hydronephrosis   . GERD  (gastroesophageal reflux disease)   . Back pain   . Depression   . Osteoporosis   . Secondary erythrocytosis 08/05/2012    Secondary to COPD    has Primary hyperparathyroidism; VITAMIN D DEFICIENCY; HYPERLIPIDEMIA; HYPERCALCEMIA; Anxiety and depression; TOBACCO ABUSE; HYPERTENSION; Arteriosclerotic cardiovascular disease (ASCVD); PERIPHERAL VASCULAR DISEASE; ALLERGIC RHINITIS, SEASONAL; GASTROESOPHAGEAL REFLUX DISEASE; BACK PAIN, CHRONIC; OSTEOPOROSIS; FASTING HYPERGLYCEMIA; Emphysema with chronic bronchitis; Sinusitis; Bronchitis; Back pain with radiation; Hearing loss; Carotid bruit; Routine general medical examination at a health care facility; Secondary erythrocytosis; and Cervical neck pain with evidence of disc disease on her problem list.     has No Known Allergies.  Ms. Dwiggins had no medications administered during this visit.  Past Surgical History  Procedure Laterality Date  . Cholecystectomy    . Aorta bifem graft    . Right kidney surgery following damage during arterial surgery    . Partial hysterectomy    . Total abdominal hysterectomy w/ bilateral salpingoophorectomy  1975  . Lysis of adhesions  2000  . Abdominal hysterectomy      Denies any headaches, dizziness, double vision, fevers, chills, night sweats, nausea, vomiting, diarrhea, constipation, chest pain, heart palpitations, shortness of breath, blood in stool, black tarry stool, urinary pain, urinary burning, urinary frequency, hematuria.   PHYSICAL EXAMINATION  ECOG PERFORMANCE STATUS: 0 - Asymptomatic  Filed Vitals:   11/04/12 1155  BP: 129/82  Pulse: 98  Temp: 98.2 F (36.8 C)  Resp: 16    GENERAL:alert, no distress, well nourished, well developed, comfortable, cooperative, smiling and chronically ill appearing SKIN: skin color, texture, turgor are normal, no rashes or significant lesions HEAD: Normocephalic, No masses, lesions, tenderness or abnormalities EYES: normal, PERRLA, EOMI, Conjunctiva are  pink and non-injected EARS: External ears normal OROPHARYNX:mucous membranes are moist  NECK: supple, no adenopathy, thyroid normal size, non-tender, without nodularity, no stridor, non-tender, trachea midline LYMPH:  no palpable lymphadenopathy, no hepatosplenomegaly BREAST:not examined LUNGS: clear to auscultation and percussion, decreased breath sounds HEART: regular rate & rhythm, no murmurs, no gallops, S1 normal and S2 normal ABDOMEN:abdomen soft, non-tender, normal bowel sounds, no masses or organomegaly and no hepatosplenomegaly BACK: Back symmetric, no curvature. EXTREMITIES:less then 2 second capillary refill, no joint deformities, effusion, or inflammation, no skin discoloration, no clubbing, no cyanosis  NEURO: alert & oriented x 3 with fluent speech, no focal motor/sensory deficits, gait normal    LABORATORY DATA: CBC    Component Value Date/Time   WBC 6.8 10/28/2012 0914   RBC 4.86 10/28/2012 0914   HGB 14.8 10/28/2012 0914   HCT 45.4 10/28/2012 0914   PLT 240 10/28/2012 0914   MCV 93.4 10/28/2012 0914   MCH 30.5 10/28/2012 0914   MCHC 32.6 10/28/2012 0914   RDW 13.9 10/28/2012 0914   LYMPHSABS 3.1 08/05/2012 1214   MONOABS 0.6 08/05/2012 1214   EOSABS 0.2 08/05/2012 1214   BASOSABS 0.0 08/05/2012 1214      ASSESSMENT:  1. Secondary erythrocytosis due to COPD, Jak2 negative.  2. COPD, with elevated carbon monoxide hemoglobin level.  3. Elevated erythropoetin level, relative to Hemoglobin level  4. H/O Tobacco abuse, negative CT of chest looking for malignancy. Quite smoking in Jan 2014.  5. Cough, secondary to #4.  6. Hyperparathyroidism, followed by Endocrinologist, Dr. Fransico Him.  Patient Active Problem List   Diagnosis Date Noted  . Cervical neck pain with evidence of disc disease 11/03/2012  . Secondary erythrocytosis 08/05/2012  . Routine general medical examination at a health care facility 04/14/2012  . Hearing loss 04/13/2012  . Carotid bruit 04/13/2012  . Back pain with  radiation 10/15/2011  . Sinusitis 03/05/2011  . Bronchitis 03/05/2011  . Emphysema with chronic bronchitis 01/17/2011  . FASTING HYPERGLYCEMIA 09/18/2009  . TOBACCO ABUSE 09/17/2009  . Arteriosclerotic cardiovascular disease (ASCVD) 09/17/2009  . PERIPHERAL VASCULAR DISEASE 09/17/2009  . GASTROESOPHAGEAL REFLUX DISEASE 09/17/2009  . Primary hyperparathyroidism 01/20/2009  . VITAMIN D DEFICIENCY 01/20/2009  . HYPERCALCEMIA 12/30/2008  . ALLERGIC RHINITIS, SEASONAL 09/07/2007  . HYPERLIPIDEMIA 06/12/2007  . Anxiety and depression 06/12/2007  . HYPERTENSION 06/12/2007  . BACK PAIN, CHRONIC 06/12/2007  . OSTEOPOROSIS 06/12/2007      PLAN:  1. I personally reviewed and went over laboratory results with the patient. 2. Encouraged continued smoking cessation 3. Defer right shoulder pain to PCP 4. Refused writing new pain medication 5. Labs in 3 months: CBC, JAK2 exon 12 and 13 6. Labs in 6 months: CBC 7. Return in 6 months for follow-up   THERAPY PLAN:  Her labs have improved since quitting smoking.  She does not meet parameters for phlebotomy or other intervention at this time. We will monitor her labs as described above.  If over the next 12-24 months her labs remain stable will consider releasing from clinic.  All questions were answered. The patient knows to call the clinic with any problems, questions or concerns. We can certainly see the  patient much sooner if necessary.  Patient and plan discussed with Dr. Benita Gutter and he is in agreement with the aforementioned.   Aashi Derrington

## 2012-11-04 NOTE — Telephone Encounter (Signed)
Patient decided to remain with [facility].

## 2012-11-09 ENCOUNTER — Other Ambulatory Visit: Payer: Self-pay | Admitting: Family Medicine

## 2012-11-09 ENCOUNTER — Telehealth: Payer: Self-pay | Admitting: Family Medicine

## 2012-11-09 ENCOUNTER — Ambulatory Visit (HOSPITAL_COMMUNITY)
Admission: RE | Admit: 2012-11-09 | Discharge: 2012-11-09 | Disposition: A | Discharge status: Home / Self Care | Payer: Medicare Other | Source: Ambulatory Visit | Attending: Family Medicine | Admitting: Family Medicine

## 2012-11-09 DIAGNOSIS — M542 Cervicalgia: Secondary | ICD-10-CM | POA: Insufficient documentation

## 2012-11-09 DIAGNOSIS — M47812 Spondylosis without myelopathy or radiculopathy, cervical region: Secondary | ICD-10-CM | POA: Insufficient documentation

## 2012-11-09 DIAGNOSIS — M5020 Other cervical disc displacement, unspecified cervical region: Secondary | ICD-10-CM | POA: Insufficient documentation

## 2012-11-09 DIAGNOSIS — M5380 Other specified dorsopathies, site unspecified: Secondary | ICD-10-CM | POA: Insufficient documentation

## 2012-11-09 DIAGNOSIS — M5090 Cervical disc disorder, unspecified, unspecified cervical region: Secondary | ICD-10-CM

## 2012-11-09 NOTE — Assessment & Plan Note (Addendum)
Uncontrolled severe neck, chest and left upper extremity pain x 2 month. Needs MRI c spine to further eval Toradol and depo medrol in office , followed by short course of prednisone Add gabapentin at bedtime

## 2012-11-09 NOTE — Assessment & Plan Note (Signed)
Controlled, no change in medication Hyperlipidemia:Low fat diet discussed and encouraged.  \ 

## 2012-11-09 NOTE — Telephone Encounter (Signed)
I Spoke directly with oon call re abnormal MRI of neck. Dr Wynetta Emery reviewed the films and stated that he will have his office directly contact the pt tomorrow for an appointment, I gave home tele info to him I then called the pt, Carla [name], and advised her to expect a call from the office of the neurosurgeon tomorrow for an appointment to be seen

## 2012-11-09 NOTE — Assessment & Plan Note (Signed)
Controlled, no change in medication DASH diet and commitment to daily physical activity for a minimum of 30 minutes discussed and encouraged, as a part of hypertension management. The importance of attaining a healthy weight is also discussed.  

## 2012-11-09 NOTE — Assessment & Plan Note (Addendum)
Controlled continue wellbutrin, to also help with smoking cessation, and xanax

## 2012-11-10 ENCOUNTER — Telehealth: Payer: Self-pay | Admitting: Family Medicine

## 2012-11-10 NOTE — Telephone Encounter (Signed)
Advised she already gets hydrocodone but she said she only takes it twice per day and I advised that with a quantity of 90 that she could take it three times daily and she said that is what she will do

## 2012-12-17 LAB — LIPID PANEL
LDL Cholesterol: 91 mg/dL (ref 0–99)
VLDL: 44 mg/dL — ABNORMAL HIGH (ref 0–40)

## 2012-12-17 LAB — COMPREHENSIVE METABOLIC PANEL
ALT: 13 U/L (ref 0–35)
AST: 22 U/L (ref 0–37)
CO2: 28 mEq/L (ref 19–32)
Calcium: 10.3 mg/dL (ref 8.4–10.5)
Chloride: 104 mEq/L (ref 96–112)
Sodium: 138 mEq/L (ref 135–145)
Total Protein: 7.4 g/dL (ref 6.0–8.3)

## 2012-12-22 ENCOUNTER — Ambulatory Visit: Payer: Medicare Other | Admitting: Family Medicine

## 2012-12-22 ENCOUNTER — Encounter: Payer: Medicare Other | Admitting: Family Medicine

## 2013-01-02 ENCOUNTER — Other Ambulatory Visit: Payer: Self-pay | Admitting: Family Medicine

## 2013-01-04 ENCOUNTER — Telehealth: Payer: Self-pay | Admitting: Family Medicine

## 2013-01-04 ENCOUNTER — Other Ambulatory Visit: Payer: Self-pay | Admitting: Family Medicine

## 2013-01-04 NOTE — Telephone Encounter (Signed)
Pls deny hydrocodne, Pt will need OV with me to further asddress if she calls back, pls ask pharmacy to print her oxycodone fills and leave on my desk

## 2013-01-04 NOTE — Telephone Encounter (Signed)
noted 

## 2013-01-04 NOTE — Telephone Encounter (Signed)
Patient is coming in 10/14 to discuss

## 2013-01-04 NOTE — Telephone Encounter (Signed)
Patient states that the oxycodone makes her sick and she cannot take it.

## 2013-01-05 ENCOUNTER — Ambulatory Visit (INDEPENDENT_AMBULATORY_CARE_PROVIDER_SITE_OTHER): Payer: Medicare Other | Admitting: Family Medicine

## 2013-01-05 ENCOUNTER — Encounter: Payer: Self-pay | Admitting: Family Medicine

## 2013-01-05 VITALS — BP 138/72 | HR 82 | Resp 18 | Ht 61.75 in | Wt 138.0 lb

## 2013-01-05 DIAGNOSIS — F329 Major depressive disorder, single episode, unspecified: Secondary | ICD-10-CM

## 2013-01-05 DIAGNOSIS — M5090 Cervical disc disorder, unspecified, unspecified cervical region: Secondary | ICD-10-CM

## 2013-01-05 DIAGNOSIS — Z79899 Other long term (current) drug therapy: Secondary | ICD-10-CM

## 2013-01-05 DIAGNOSIS — Z23 Encounter for immunization: Secondary | ICD-10-CM

## 2013-01-05 DIAGNOSIS — F341 Dysthymic disorder: Secondary | ICD-10-CM

## 2013-01-05 MED ORDER — HYDROCODONE-ACETAMINOPHEN 10-325 MG PO TABS: ORAL_TABLET | ORAL | Status: DC

## 2013-01-05 NOTE — Patient Instructions (Addendum)
F/u in 2 month  Flu vaccine today  Please ensure you do not break this pain contract, because if you do I will no longer prescribe pain medication for you. You will receive a copy to keep   Xanax is reduced to one daily, next fill due in 49 days , which is the end of Novemebr.  I will inform the neurosurgeon of the situation with your pain medication so no more is prescribed by him until you have surgery at which time he will be responsible till he releases you

## 2013-01-07 ENCOUNTER — Telehealth: Payer: Self-pay | Admitting: Family Medicine

## 2013-01-07 NOTE — Telephone Encounter (Signed)
Left message that she can not go with her nerve pills 1 a day please call back

## 2013-01-08 NOTE — Telephone Encounter (Signed)
She will need to work at this as this is what she has been taking. I am very willing to get her help with a psychiatrist for her nerves  , she needs to let me know if I should refer her

## 2013-01-10 DIAGNOSIS — Z79899 Other long term (current) drug therapy: Secondary | ICD-10-CM | POA: Insufficient documentation

## 2013-01-10 NOTE — Assessment & Plan Note (Signed)
Pt to be maintained on med from this office until she has surgery, will  Make neurosurg aware of the situation

## 2013-01-10 NOTE — Assessment & Plan Note (Signed)
Reduce dose to what pt is actually taking based on pill count in office which is one tablet once daily

## 2013-01-10 NOTE — Assessment & Plan Note (Signed)
Face to face encounter with pt to discuss and sign pain contract as she has been recently obtaining pain meds from neurosurgeon, despite not having had surgery yet, and also states the med prescribed was ineffective though she had filled twice , and now reports "flushing"

## 2013-01-10 NOTE — Progress Notes (Signed)
  Subjective:    Patient ID: JALEISA BROSE, female    DOB: Feb 15, 1946, 67 y.o.   MRN: [id number]  HPI  Pt specifically asked to come in because I recently realized that hse had filled two prescription fro narcotic pain medication from the neurosurgeon who I referred her to regarding her neck pain. Hs not yet had the surgery, initially stated she was waiting till her return from the beach, now states her spouse is ill. Pt reports that though the oxycodone she was prescribed did not help her like her regular vicodin, she still collected a 2nd prescription. Also alluded to being unaware that she should not fiill the script as was on regular vicodin from this office, however she chose to fill the 2nd script at another pharmacy, [facility], not her regular pharmacy Collected oxycodone within the last week, has not brought in today stating she dumped it since it did not work Has her meds, and count of the xanax , shows ans excess of pills in the bottle, clearly has been taking one daily while insisting that she needs 3 daily. I advised his would end effective today Tearful ,and  stated she "was sorry' that she had filled the other pain script I advised I would make neurosurgeon aware that she is on a pain contract here  Review of Systems .c  c/o pain, anxiety and stress Objective:   Physical Exam Tearful female in no c/p distress. No exam done at visit , as this is for a medication consultation only, and no complaints were voiced       Assessment & Plan:

## 2013-01-20 NOTE — Telephone Encounter (Signed)
Called and spoke with patient.  She was given advise to which she responded that she does not want to go to psychiatry and she will get the xanax some way.  No further advice given.

## 2013-02-03 ENCOUNTER — Other Ambulatory Visit: Payer: Self-pay

## 2013-02-03 DIAGNOSIS — M5090 Cervical disc disorder, unspecified, unspecified cervical region: Secondary | ICD-10-CM

## 2013-02-03 MED ORDER — HYDROCODONE-ACETAMINOPHEN 10-325 MG PO TABS: ORAL_TABLET | ORAL | Status: DC

## 2013-02-04 ENCOUNTER — Other Ambulatory Visit (HOSPITAL_COMMUNITY): Payer: Medicare Other

## 2013-02-08 ENCOUNTER — Other Ambulatory Visit: Payer: Self-pay | Admitting: Family Medicine

## 2013-02-15 ENCOUNTER — Telehealth: Payer: Self-pay

## 2013-02-15 DIAGNOSIS — N3000 Acute cystitis without hematuria: Secondary | ICD-10-CM

## 2013-02-15 LAB — POCT URINALYSIS DIPSTICK
Ketones, UA: NEGATIVE
Nitrite, UA: POSITIVE
Protein, UA: 100
pH, UA: 6.5

## 2013-02-15 MED ORDER — CIPROFLOXACIN HCL 500 MG PO TABS: 500.0000 mg | ORAL_TABLET | Freq: Two times a day (BID) | ORAL | Status: AC

## 2013-02-15 NOTE — Telephone Encounter (Signed)
Patient states that she has intense burning with urination.   No appointments available for today.  Advised to come in to office to submit urine sample.

## 2013-02-15 NOTE — Telephone Encounter (Signed)
Patient in and submitted urine.  ABT Cipro 500mg  1 po twice daily x 5 days prescribed.  Urine sent for culture.

## 2013-02-15 NOTE — Telephone Encounter (Signed)
Case was discussed with me, i gave the orders which were carried out

## 2013-02-18 LAB — URINE CULTURE

## 2013-02-19 ENCOUNTER — Other Ambulatory Visit: Payer: Self-pay | Admitting: Family Medicine

## 2013-02-22 ENCOUNTER — Other Ambulatory Visit: Payer: Self-pay

## 2013-02-22 DIAGNOSIS — F329 Major depressive disorder, single episode, unspecified: Secondary | ICD-10-CM

## 2013-02-22 MED ORDER — ALPRAZOLAM 0.5 MG PO TABS: ORAL_TABLET | ORAL | Status: DC

## 2013-02-24 ENCOUNTER — Other Ambulatory Visit: Payer: Self-pay

## 2013-02-24 DIAGNOSIS — M5090 Cervical disc disorder, unspecified, unspecified cervical region: Secondary | ICD-10-CM

## 2013-02-24 MED ORDER — HYDROCODONE-ACETAMINOPHEN 10-325 MG PO TABS: ORAL_TABLET | ORAL | Status: DC

## 2013-03-02 ENCOUNTER — Ambulatory Visit: Payer: Medicare Other | Admitting: Family Medicine

## 2013-03-30 ENCOUNTER — Encounter (INDEPENDENT_AMBULATORY_CARE_PROVIDER_SITE_OTHER): Payer: Self-pay

## 2013-03-30 ENCOUNTER — Ambulatory Visit (INDEPENDENT_AMBULATORY_CARE_PROVIDER_SITE_OTHER): Payer: Medicare Other | Admitting: Family Medicine

## 2013-03-30 ENCOUNTER — Other Ambulatory Visit: Payer: Self-pay | Admitting: Family Medicine

## 2013-03-30 ENCOUNTER — Encounter: Payer: Self-pay | Admitting: Family Medicine

## 2013-03-30 VITALS — BP 146/78 | HR 100 | Resp 18 | Ht 61.75 in | Wt 138.1 lb

## 2013-03-30 DIAGNOSIS — Z13820 Encounter for screening for osteoporosis: Secondary | ICD-10-CM

## 2013-03-30 DIAGNOSIS — F341 Dysthymic disorder: Secondary | ICD-10-CM

## 2013-03-30 DIAGNOSIS — I798 Other disorders of arteries, arterioles and capillaries in diseases classified elsewhere: Secondary | ICD-10-CM

## 2013-03-30 DIAGNOSIS — J309 Allergic rhinitis, unspecified: Secondary | ICD-10-CM

## 2013-03-30 DIAGNOSIS — I10 Essential (primary) hypertension: Secondary | ICD-10-CM

## 2013-03-30 DIAGNOSIS — R7309 Other abnormal glucose: Secondary | ICD-10-CM

## 2013-03-30 DIAGNOSIS — M549 Dorsalgia, unspecified: Secondary | ICD-10-CM

## 2013-03-30 DIAGNOSIS — E1159 Type 2 diabetes mellitus with other circulatory complications: Secondary | ICD-10-CM

## 2013-03-30 DIAGNOSIS — M5090 Cervical disc disorder, unspecified, unspecified cervical region: Secondary | ICD-10-CM

## 2013-03-30 DIAGNOSIS — E785 Hyperlipidemia, unspecified: Secondary | ICD-10-CM

## 2013-03-30 DIAGNOSIS — E210 Primary hyperparathyroidism: Secondary | ICD-10-CM

## 2013-03-30 DIAGNOSIS — J302 Other seasonal allergic rhinitis: Secondary | ICD-10-CM | POA: Insufficient documentation

## 2013-03-30 DIAGNOSIS — Z1239 Encounter for other screening for malignant neoplasm of breast: Secondary | ICD-10-CM

## 2013-03-30 DIAGNOSIS — F419 Anxiety disorder, unspecified: Principal | ICD-10-CM

## 2013-03-30 DIAGNOSIS — E1151 Type 2 diabetes mellitus with diabetic peripheral angiopathy without gangrene: Secondary | ICD-10-CM

## 2013-03-30 DIAGNOSIS — F329 Major depressive disorder, single episode, unspecified: Secondary | ICD-10-CM

## 2013-03-30 MED ORDER — ALPRAZOLAM 0.5 MG PO TABS: 0.5000 mg | ORAL_TABLET | Freq: Two times a day (BID) | ORAL | Status: DC

## 2013-03-30 MED ORDER — MIRTAZAPINE 7.5 MG PO TABS: 7.5000 mg | ORAL_TABLET | Freq: Every day | ORAL | Status: DC

## 2013-03-30 MED ORDER — FLUTICASONE PROPIONATE 50 MCG/ACT NA SUSP: 2.0000 | Freq: Every day | NASAL | Status: DC

## 2013-03-30 MED ORDER — HYDROCODONE-ACETAMINOPHEN 10-325 MG PO TABS: ORAL_TABLET | ORAL | Status: DC

## 2013-03-30 NOTE — Patient Instructions (Addendum)
Pelvic and breast in 3 month, call if you need me before  HBA1C, and chem 7 and calcium  today  Fasting lipid and  hepatic, in 3 month, before next visit.  Mammogram and bone density scan to be scheduled at checkout  Increase in xanax to twice daily effective 03/30/2013  No change in pain medication dosage   Additional medication for sleep and depression, remeron at bedtime  Flonase for allergies

## 2013-03-30 NOTE — Progress Notes (Signed)
   Subjective:    Patient ID: Carla [name], female    DOB: [birth year], 68 y.o.   MRN: [id number]  HPI The PT is here for follow up and re-evaluation of chronic medical conditions, medication management and review of any available recent lab and radiology data.  Preventive health is updated, specifically  Cancer screening and Immunization.  Mammogram, pelvic and dexa are all past due  The PT denies any adverse reactions to current medications since the last visit.  C/o increased and uncontrolled allergy symptoms with nasal congestion and runny nose and sneezing in past 2 to 3 weeks C/o increased anxiety and poor sleep due to poor family dynamics and her ill spouse  Ongoing neck and back pain, still deferring surgery, too much going on in the family      Review of Systems See HPI Denies recent fever or chills. Denies ear pain or sore throat. Denies chest congestion, productive cough or wheezing. Denies chest pains, palpitations and leg swelling Denies abdominal pain, nausea, vomiting,diarrhea or constipation.   Denies dysuria, frequency, hesitancy or incontinence. . Denies headaches, seizures, numbness, or tingling.  Denies skin break down or rash.        Objective:   Physical Exam BP 146/78  Pulse 100  Resp 18  Ht 5' 1.75" (1.568 m)  Wt 138 lb 1.3 oz (62.633 kg)  BMI 25.47 kg/m2  SpO2 96% Patient alert and oriented and in no cardiopulmonary distress.  HEENT: No facial asymmetry, EOMI, no sinus tenderness,  oropharynx pink and moist.  Neck decreased ROM, no adenopathy.TM clear bilaterally, nasal mucosa edenmatous, excessive clear nasal drainage and sneezing at visit  Chest: Clear to auscultation bilaterally.Decreased though adequate air entry  CVS: S1, S2 no murmurs, no S3.  ABD: Soft non tender. Bowel sounds normal.  Ext: No edema  MS: Adequate though reduced  ROM spine,  Adequate in shoulders, hips and knees.  Skin: Intact, no ulcerations or rash  noted.  Psych: Good eye contact, normal affect. Memory intact anxious and mildly depressed appearing.Tearful at times  CNS: CN 2-12 intact, power, tone and sensation normal throughout.        Assessment & Plan:  Anxiety and depression Not suicidal or homicidal, but reports increased stress, her spouse is sick, and the relationship with her sons and grand chi;ldren is strained, she states "because they wont do right" remeron added to help with both depression and sleep, and xanax dose is increased  BACK PAIN, CHRONIC Unchnaged, surgery for neck has been recommended since last year, has put it off to present due to family issues  Pain management will remain the same  HYPERTENSION Controlled, no change in medication DASH diet and commitment to daily physical activity for a minimum of 30 minutes discussed and encouraged, as a part of hypertension management. The importance of attaining a healthy weight is also discussed.   Seasonal allergies Uncontrolled, needs to commit to daily med  Well controlled type 2 diabetes mellitus with peripheral circulatory disorder Pt to start metformin Patient advised to reduce carb and sweets, commit to regular physical activity, take meds as prescribed, test blood as directed, and attempt to lose weight, to improve blood sugar control.   HYPERLIPIDEMIA Uncontrolled Hyperlipidemia:Low fat diet discussed and encouraged.  Updated lab needed at/ before next visit.

## 2013-03-31 DIAGNOSIS — E1151 Type 2 diabetes mellitus with diabetic peripheral angiopathy without gangrene: Secondary | ICD-10-CM | POA: Insufficient documentation

## 2013-03-31 LAB — BASIC METABOLIC PANEL
BUN: 13 mg/dL (ref 6–23)
CO2: 29 mEq/L (ref 19–32)
CREATININE: 1.14 mg/dL — AB (ref 0.50–1.10)
Calcium: 10.2 mg/dL (ref 8.4–10.5)
Chloride: 103 mEq/L (ref 96–112)
Glucose, Bld: 85 mg/dL (ref 70–99)
POTASSIUM: 4.5 meq/L (ref 3.5–5.3)
Sodium: 141 mEq/L (ref 135–145)

## 2013-03-31 LAB — CALCIUM: Calcium: 10.2 mg/dL (ref 8.4–10.5)

## 2013-03-31 LAB — HEMOGLOBIN A1C
HEMOGLOBIN A1C: 6.7 % — AB (ref <5.7)
MEAN PLASMA GLUCOSE: 146 mg/dL — AB (ref <117)

## 2013-04-01 ENCOUNTER — Telehealth: Payer: Self-pay | Admitting: Family Medicine

## 2013-04-01 LAB — CREATININE WITH EST GFR
Creat: 1.23 mg/dL — ABNORMAL HIGH (ref 0.50–1.10)
GFR, Est African American: 52 mL/min — ABNORMAL LOW
GFR, Est Non African American: 45 mL/min — ABNORMAL LOW

## 2013-04-02 ENCOUNTER — Other Ambulatory Visit: Payer: Self-pay

## 2013-04-02 DIAGNOSIS — E1151 Type 2 diabetes mellitus with diabetic peripheral angiopathy without gangrene: Secondary | ICD-10-CM

## 2013-04-02 MED ORDER — METFORMIN HCL ER 500 MG PO TB24: 500.0000 mg | ORAL_TABLET | Freq: Every day | ORAL | Status: DC

## 2013-04-02 NOTE — Telephone Encounter (Signed)
Will call patient back and address concern.

## 2013-04-07 LAB — MICROALBUMIN / CREATININE URINE RATIO
Creatinine, Urine: 105.7 mg/dL
MICROALB/CREAT RATIO: 235.4 mg/g — AB (ref 0.0–30.0)
Microalb, Ur: 24.88 mg/dL — ABNORMAL HIGH (ref 0.00–1.89)

## 2013-04-12 ENCOUNTER — Telehealth: Payer: Self-pay

## 2013-04-12 MED ORDER — PENICILLIN V POTASSIUM 500 MG PO TABS: 500.0000 mg | ORAL_TABLET | Freq: Three times a day (TID) | ORAL | Status: DC

## 2013-04-12 MED ORDER — BENZONATATE 100 MG PO CAPS: 100.0000 mg | ORAL_CAPSULE | Freq: Three times a day (TID) | ORAL | Status: DC | PRN

## 2013-04-12 NOTE — Addendum Note (Signed)
Addended by: Denman George B on: 04/12/2013 04:45 PM   Modules accepted: Orders

## 2013-04-12 NOTE — Telephone Encounter (Signed)
Patient states she was here last week and has developed a cough x 3 days. Feels like there is congestion in chest but she is only able to cough up small amount of phlegm and it is green. No fever chills or bodyaches. Has not tried anything OTC. Please advise  [phone]

## 2013-04-12 NOTE — Telephone Encounter (Signed)
Patient aware and med sent to pharmacy.  

## 2013-04-12 NOTE — Telephone Encounter (Signed)
pls advise and send in pen v 500mg  one tab 3 times daily #21 and tessalon perles 100mg  one 3 times daiyl as needed #14

## 2013-04-23 ENCOUNTER — Ambulatory Visit (HOSPITAL_COMMUNITY)
Admission: RE | Admit: 2013-04-23 | Discharge: 2013-04-23 | Disposition: A | Discharge status: Home / Self Care | Payer: Medicare Other | Source: Ambulatory Visit | Attending: Family Medicine | Admitting: Family Medicine

## 2013-04-23 DIAGNOSIS — Z1231 Encounter for screening mammogram for malignant neoplasm of breast: Secondary | ICD-10-CM | POA: Insufficient documentation

## 2013-04-23 DIAGNOSIS — Z13820 Encounter for screening for osteoporosis: Secondary | ICD-10-CM

## 2013-04-23 DIAGNOSIS — M818 Other osteoporosis without current pathological fracture: Secondary | ICD-10-CM | POA: Insufficient documentation

## 2013-04-23 DIAGNOSIS — Z780 Asymptomatic menopausal state: Secondary | ICD-10-CM | POA: Insufficient documentation

## 2013-04-23 DIAGNOSIS — Z1239 Encounter for other screening for malignant neoplasm of breast: Secondary | ICD-10-CM

## 2013-04-26 ENCOUNTER — Other Ambulatory Visit: Payer: Self-pay

## 2013-04-26 DIAGNOSIS — M5090 Cervical disc disorder, unspecified, unspecified cervical region: Secondary | ICD-10-CM

## 2013-04-26 MED ORDER — HYDROCODONE-ACETAMINOPHEN 10-325 MG PO TABS: ORAL_TABLET | ORAL | Status: DC

## 2013-04-27 ENCOUNTER — Encounter: Payer: Self-pay | Admitting: Family Medicine

## 2013-05-04 ENCOUNTER — Telehealth: Payer: Self-pay | Admitting: *Deleted

## 2013-05-04 NOTE — Telephone Encounter (Signed)
pls let her know she has been set uip for surgery which is what will helpm her. If she still wants to hold off on the surgery offer referral to pain clinic for epidural , please

## 2013-05-04 NOTE — Telephone Encounter (Signed)
Pt called saying she has a bad disk in her back and Dr. Moshe Cipro gave her hydrocodone and it is not helping, pt would like to try something else, pt's back hurts so bad she cannot sleep. Please advise [phone]

## 2013-05-07 ENCOUNTER — Other Ambulatory Visit (HOSPITAL_COMMUNITY): Payer: Medicare Other

## 2013-05-09 NOTE — Progress Notes (Signed)
-   No show, letter sent- Opie Fanton   

## 2013-05-10 ENCOUNTER — Ambulatory Visit (HOSPITAL_COMMUNITY): Payer: Medicare Other | Admitting: Oncology

## 2013-05-15 NOTE — Assessment & Plan Note (Signed)
Unchnaged, surgery for neck has been recommended since last year, has put it off to present due to family issues  Pain management will remain the same

## 2013-05-15 NOTE — Assessment & Plan Note (Addendum)
Not suicidal or homicidal, but reports increased stress, her spouse is sick, and the relationship with her sons and grand chi;ldren is strained, she states "because they wont do right" remeron added to help with both depression and sleep, and xanax dose is increased

## 2013-05-15 NOTE — Assessment & Plan Note (Signed)
Uncontrolled. Hyperlipidemia:Low fat diet discussed and encouraged.  Updated lab needed at/ before next visit.  

## 2013-05-15 NOTE — Assessment & Plan Note (Signed)
Pt to start metformin Patient advised to reduce carb and sweets, commit to regular physical activity, take meds as prescribed, test blood as directed, and attempt to lose weight, to improve blood sugar control.

## 2013-05-15 NOTE — Assessment & Plan Note (Signed)
Controlled, no change in medication DASH diet and commitment to daily physical activity for a minimum of 30 minutes discussed and encouraged, as a part of hypertension management. The importance of attaining a healthy weight is also discussed.  

## 2013-05-15 NOTE — Assessment & Plan Note (Signed)
Uncontrolled, needs to commit to daily med 

## 2013-05-17 ENCOUNTER — Other Ambulatory Visit: Payer: Self-pay

## 2013-05-17 MED ORDER — ALENDRONATE SODIUM 70 MG PO TABS: 70.0000 mg | ORAL_TABLET | ORAL | Status: DC

## 2013-05-28 ENCOUNTER — Other Ambulatory Visit: Payer: Self-pay

## 2013-05-28 DIAGNOSIS — M5090 Cervical disc disorder, unspecified, unspecified cervical region: Secondary | ICD-10-CM

## 2013-05-28 MED ORDER — HYDROCODONE-ACETAMINOPHEN 10-325 MG PO TABS: ORAL_TABLET | ORAL | Status: DC

## 2013-06-05 ENCOUNTER — Other Ambulatory Visit: Payer: Self-pay | Admitting: Family Medicine

## 2013-06-07 ENCOUNTER — Encounter (HOSPITAL_COMMUNITY): Payer: Self-pay | Admitting: Oncology

## 2013-06-14 NOTE — Progress Notes (Signed)
Carla Nakayama, MD [address] [place] [address]  Secondary erythrocytosis - Plan: CBC with Differential  Superficial bruising - Plan: CBC with Differential, Platelet function assay, Von Willebrand multimeric, Protime-INR, APTT  CURRENT THERAPY:Observation  INTERVAL HISTORY: Carla [name] 68 y.o. female returns for  regular  visit for followup of secondary erythrocytosis due to COPD, Jak2 negative.  I personally reviewed and went over laboratory results with the patient.  The results are noted within this dictation.  She quit smoking last summer and repeat labs demonstrated normalization of Hgb on 10/28/12.  She therefore has demonstrated normalization of Hgb with smoking cessation.  I personally reviewed and went over radiographic studies with the patient.  The results are noted within this dictation.    Mammogram from 04/26/2013 was BIRADS 1 and she will be due for a follow-up mammogram annually in Feb 2016.   She notes that her UE are bruising much easier than before.  She admits that mild- minimal trauma.  She is noted to be on ASA daily and this is likely the contributor to her bruising as ASA interferes with platelet activation, therefore causing bruising.  However, we will rule out other possible causes with labs.  Otherwise hematologically, she denies any complaints and ROS questioning is negative.    Past Medical History  Diagnosis Date  . ASCVD (arteriosclerotic cardiovascular disease)     No critical disease on cath in 8/97: mild AI with trival, if any l AS; negative stress nuclear in 2010  . Peripheral vascular disease     stauts post aortabifemoral graft    . Tobacco abuse 2010    Continue at 1/2 pack per day   . Chronic bronchitis   . Hypertension   . Fasting hyperglycemia   . Elevated hemoglobin A1c   . Hydronephrosis   . GERD (gastroesophageal reflux disease)   . Back pain   . Depression   . Osteoporosis   . Secondary erythrocytosis  08/05/2012    Secondary to COPD  . Cervical disc herniation     has Primary hyperparathyroidism; VITAMIN D DEFICIENCY; HYPERLIPIDEMIA; HYPERCALCEMIA; Anxiety and depression; HYPERTENSION; Arteriosclerotic cardiovascular disease (ASCVD); PERIPHERAL VASCULAR DISEASE; GASTROESOPHAGEAL REFLUX DISEASE; BACK PAIN, CHRONIC; OSTEOPOROSIS; FASTING HYPERGLYCEMIA; Emphysema with chronic bronchitis; Bronchitis; Back pain with radiation; Hearing loss; Carotid bruit; Secondary erythrocytosis; Cervical neck pain with evidence of disc disease; Seasonal allergies; and Well controlled type 2 diabetes mellitus with peripheral circulatory disorder on her problem list.     has No Known Allergies.  Carla [name] had no medications administered during this visit.  Past Surgical History  Procedure Laterality Date  . Cholecystectomy    . Aorta bifem graft    . Right kidney surgery following damage during arterial surgery    . Partial hysterectomy    . Total abdominal hysterectomy w/ bilateral salpingoophorectomy  1975  . Lysis of adhesions  2000  . Abdominal hysterectomy      Denies any headaches, dizziness, double vision, fevers, chills, night sweats, nausea, vomiting, diarrhea, constipation, chest pain, heart palpitations, shortness of breath, blood in stool, black tarry stool, urinary pain, urinary burning, urinary frequency, hematuria.   PHYSICAL EXAMINATION  ECOG PERFORMANCE STATUS: 1 - Symptomatic but completely ambulatory  Filed Vitals:   06/15/13 0948  BP: 131/72  Pulse: 69  Temp: 97.7 F (36.5 C)  Resp: 16    GENERAL:alert, no distress, well nourished, well developed, comfortable, cooperative and smiling SKIN: skin color, texture, turgor are normal  HEAD: Normocephalic, No masses, lesions, tenderness or abnormalities, thickened facial skin from smoking in past. EYES: normal, PERRLA, EOMI, Conjunctiva are pink and non-injected EARS: External ears normal OROPHARYNX:mucous membranes are moist    NECK: supple, trachea midline LYMPH:  not examined BREAST:not examined LUNGS: not examined HEART: not examined ABDOMEN:not examined BACK: Back symmetric, no curvature. EXTREMITIES:less then 2 second capillary refill, no skin discoloration, no cyanosis  NEURO: alert & oriented x 3 with fluent speech, no focal motor/sensory deficits, gait normal   LABORATORY DATA: CBC    Component Value Date/Time   WBC 6.8 10/28/2012 0914   RBC 4.86 10/28/2012 0914   HGB 14.8 10/28/2012 0914   HCT 45.4 10/28/2012 0914   PLT 240 10/28/2012 0914   MCV 93.4 10/28/2012 0914   MCH 30.5 10/28/2012 0914   MCHC 32.6 10/28/2012 0914   RDW 13.9 10/28/2012 0914   LYMPHSABS 3.1 08/05/2012 1214   MONOABS 0.6 08/05/2012 1214   EOSABS 0.2 08/05/2012 1214   BASOSABS 0.0 08/05/2012 1214      ASSESSMENT:  1. Secondary erythrocytosis due to COPD, Jak2 negative. Normalized in August 2014 with cessation of tobacco use. 2. COPD, with elevated carbon monoxide hemoglobin level.  3. Elevated erythropoetin level, relative to Hemoglobin level  4. H/O Tobacco abuse, negative CT of chest looking for malignancy. Quite smoking in Jan 2014.  5. Cough, secondary to #4.  6. Hyperparathyroidism, followed by Endocrinologist, Dr. Dorris Fetch. 7. Increased bruising of UE bilaterally, likely from ASA therapy.  Patient Active Problem List   Diagnosis Date Noted  . Well controlled type 2 diabetes mellitus with peripheral circulatory disorder 03/31/2013  . Seasonal allergies 03/30/2013  . Cervical neck pain with evidence of disc disease 11/03/2012  . Secondary erythrocytosis 08/05/2012  . Hearing loss 04/13/2012  . Carotid bruit 04/13/2012  . Back pain with radiation 10/15/2011  . Bronchitis 03/05/2011  . Emphysema with chronic bronchitis 01/17/2011  . FASTING HYPERGLYCEMIA 09/18/2009  . Arteriosclerotic cardiovascular disease (ASCVD) 09/17/2009  . PERIPHERAL VASCULAR DISEASE 09/17/2009  . GASTROESOPHAGEAL REFLUX DISEASE 09/17/2009  . Primary  hyperparathyroidism 01/20/2009  . VITAMIN D DEFICIENCY 01/20/2009  . HYPERCALCEMIA 12/30/2008  . HYPERLIPIDEMIA 06/12/2007  . Anxiety and depression 06/12/2007  . HYPERTENSION 06/12/2007  . BACK PAIN, CHRONIC 06/12/2007  . OSTEOPOROSIS 06/12/2007     PLAN:  1. I personally reviewed and went over laboratory results with the patient.  The results are noted within this dictation. 2. Encouraged continued smoking cessation.  3. Labs today: CBC diff, PT/INR, aPTT, platelet function assay, von Willebrand multimer 4. Recommend Senokot-S up to 8 per day. 5. Return in 2 weeks for follow-up   THERAPY PLAN:  Since she reports easy bruising, we will work that up.  If negative, we will release the patient from the clinic with follow-up by her PCP.  All questions were answered. The patient knows to call the clinic with any problems, questions or concerns. We can certainly see the patient much sooner if necessary.  Patient and plan discussed with Dr. Farrel Gobble and he is in agreement with the aforementioned.   Carla [name] 06/15/2013

## 2013-06-15 ENCOUNTER — Encounter (HOSPITAL_BASED_OUTPATIENT_CLINIC_OR_DEPARTMENT_OTHER): Payer: Medicare Other

## 2013-06-15 ENCOUNTER — Telehealth (HOSPITAL_COMMUNITY): Payer: Self-pay

## 2013-06-15 ENCOUNTER — Encounter (HOSPITAL_COMMUNITY): Payer: Self-pay | Admitting: Oncology

## 2013-06-15 ENCOUNTER — Telehealth: Payer: Self-pay | Admitting: *Deleted

## 2013-06-15 ENCOUNTER — Encounter (HOSPITAL_COMMUNITY): Payer: Medicare Other | Attending: Oncology | Admitting: Oncology

## 2013-06-15 VITALS — BP 131/72 | HR 69 | Temp 97.7°F | Resp 16 | Wt 136.5 lb

## 2013-06-15 DIAGNOSIS — M5020 Other cervical disc displacement, unspecified cervical region: Secondary | ICD-10-CM | POA: Insufficient documentation

## 2013-06-15 DIAGNOSIS — I2510 Atherosclerotic heart disease of native coronary artery without angina pectoris: Secondary | ICD-10-CM | POA: Insufficient documentation

## 2013-06-15 DIAGNOSIS — Z87891 Personal history of nicotine dependence: Secondary | ICD-10-CM | POA: Insufficient documentation

## 2013-06-15 DIAGNOSIS — D751 Secondary polycythemia: Secondary | ICD-10-CM | POA: Insufficient documentation

## 2013-06-15 DIAGNOSIS — K219 Gastro-esophageal reflux disease without esophagitis: Secondary | ICD-10-CM | POA: Insufficient documentation

## 2013-06-15 DIAGNOSIS — M810 Age-related osteoporosis without current pathological fracture: Secondary | ICD-10-CM | POA: Insufficient documentation

## 2013-06-15 DIAGNOSIS — I739 Peripheral vascular disease, unspecified: Secondary | ICD-10-CM | POA: Insufficient documentation

## 2013-06-15 DIAGNOSIS — J449 Chronic obstructive pulmonary disease, unspecified: Secondary | ICD-10-CM | POA: Insufficient documentation

## 2013-06-15 DIAGNOSIS — I10 Essential (primary) hypertension: Secondary | ICD-10-CM | POA: Insufficient documentation

## 2013-06-15 DIAGNOSIS — J4489 Other specified chronic obstructive pulmonary disease: Secondary | ICD-10-CM | POA: Insufficient documentation

## 2013-06-15 DIAGNOSIS — E119 Type 2 diabetes mellitus without complications: Secondary | ICD-10-CM | POA: Insufficient documentation

## 2013-06-15 DIAGNOSIS — Z09 Encounter for follow-up examination after completed treatment for conditions other than malignant neoplasm: Secondary | ICD-10-CM | POA: Insufficient documentation

## 2013-06-15 DIAGNOSIS — T148XXA Other injury of unspecified body region, initial encounter: Secondary | ICD-10-CM

## 2013-06-15 LAB — CBC WITH DIFFERENTIAL/PLATELET
BASOS PCT: 1 % (ref 0–1)
Basophils Absolute: 0 10*3/uL (ref 0.0–0.1)
Eosinophils Absolute: 0.1 10*3/uL (ref 0.0–0.7)
Eosinophils Relative: 2 % (ref 0–5)
HCT: 43.6 % (ref 36.0–46.0)
Hemoglobin: 14.3 g/dL (ref 12.0–15.0)
LYMPHS ABS: 2.1 10*3/uL (ref 0.7–4.0)
Lymphocytes Relative: 36 % (ref 12–46)
MCH: 30.4 pg (ref 26.0–34.0)
MCHC: 32.8 g/dL (ref 30.0–36.0)
MCV: 92.6 fL (ref 78.0–100.0)
Monocytes Absolute: 0.4 10*3/uL (ref 0.1–1.0)
Monocytes Relative: 8 % (ref 3–12)
NEUTROS PCT: 54 % (ref 43–77)
Neutro Abs: 3.1 10*3/uL (ref 1.7–7.7)
PLATELETS: 238 10*3/uL (ref 150–400)
RBC: 4.71 MIL/uL (ref 3.87–5.11)
RDW: 14.1 % (ref 11.5–15.5)
WBC: 5.8 10*3/uL (ref 4.0–10.5)

## 2013-06-15 LAB — PLATELET FUNCTION ASSAY: COLLAGEN / ADP: 99 s (ref 0–108)

## 2013-06-15 LAB — PROTIME-INR
INR: 0.88 (ref 0.00–1.49)
Prothrombin Time: 11.8 seconds (ref 11.6–15.2)

## 2013-06-15 LAB — APTT: APTT: 29 s (ref 24–37)

## 2013-06-15 NOTE — Telephone Encounter (Signed)
CRITICAL VALUE ALERT Critical value received:  EPI greater than 300 Date of notification:  06/15/13 Time of notification: 12 Critical value read back:  yes Nurse who received alert:  Sue Venita Seng, RN Thomas Kefalas, PA- C 

## 2013-06-15 NOTE — Patient Instructions (Signed)
[facility] Discharge Instructions  RECOMMENDATIONS MADE BY THE CONSULTANT AND ANY TEST RESULTS WILL BE SENT TO YOUR REFERRING PHYSICIAN.  EXAM FINDINGS BY THE PHYSICIAN TODAY AND SIGNS OR SYMPTOMS TO REPORT TO CLINIC OR PRIMARY PHYSICIAN: Exam and findings as discussed by Robynn Pane, PA-C.  Will check some labs today and bring you back in 2 weeks to discuss.  MEDICATIONS PRESCRIBED:  Senna - S start with 2 twice daily then can increase up to 8 tablets daily  INSTRUCTIONS/FOLLOW-UP: 2 weeks.  Thank you for choosing [facility] to provide your oncology and hematology care.  To afford each patient quality time with our providers, please arrive at least 15 minutes before your scheduled appointment time.  With your help, our goal is to use those 15 minutes to complete the necessary work-up to ensure our physicians have the information they need to help with your evaluation and healthcare recommendations.    Effective January 1st, 2014, we ask that you re-schedule your appointment with our physicians should you arrive 10 or more minutes late for your appointment.  We strive to give you quality time with our providers, and arriving late affects you and other patients whose appointments are after yours.    Again, thank you for choosing [facility].  Our hope is that these requests will decrease the amount of time that you wait before being seen by our physicians.       _____________________________________________________________  Should you have questions after your visit to [facility], please contact our office at [phone] between the hours of 8:30 a.m. and 5:00 p.m.  Voicemails left after 4:30 p.m. will not be returned until the following business day.  For prescription refill requests, have your pharmacy contact our office with your prescription refill request.

## 2013-06-15 NOTE — Progress Notes (Signed)
Labs drawn today for pt/ptt, von willebrand,platelet function assay,cbc/diff

## 2013-06-15 NOTE — Telephone Encounter (Signed)
Pt called saying she has started the first box of sodium aldndrote and it has made her feet and ankles swell, pt said she had serve heartburn so bad she could not swallow, pt could not eat. Pt states she is not taking anymore of this medication. Please advise [phone] or [phone]

## 2013-06-15 NOTE — Telephone Encounter (Signed)
There is an o[ption of injection once yearly for bone building , will discuss further at next visit,

## 2013-06-15 NOTE — Telephone Encounter (Signed)
She did take with a glass of water and sat upright for 30 minutes but still gave her bad heartburn and ankles swelled. Is not going to take this anymore and will continue to take her calcium/vit d

## 2013-06-15 NOTE — Telephone Encounter (Signed)
Noted  

## 2013-06-18 ENCOUNTER — Other Ambulatory Visit: Payer: Self-pay

## 2013-06-18 DIAGNOSIS — M5090 Cervical disc disorder, unspecified, unspecified cervical region: Secondary | ICD-10-CM

## 2013-06-18 MED ORDER — HYDROCODONE-ACETAMINOPHEN 10-325 MG PO TABS: ORAL_TABLET | ORAL | Status: DC

## 2013-06-22 LAB — VON WILLEBRAND FACTOR MULTIMER
Factor-VIII Activity: 68 % (ref 50–180)
Ristocetin Co-Factor: 78 % (ref 42–200)
Von Willebrand Factor Ag: 91 % (ref 50–217)

## 2013-06-26 ENCOUNTER — Other Ambulatory Visit: Payer: Self-pay | Admitting: Family Medicine

## 2013-06-26 NOTE — Progress Notes (Signed)
No show- release from [facility] to follow-up with PCP.  Please see letter dictated on 09/13/2013  [facility] 09/13/2013

## 2013-06-29 ENCOUNTER — Ambulatory Visit (HOSPITAL_COMMUNITY): Payer: Medicare Other | Admitting: Oncology

## 2013-06-29 ENCOUNTER — Other Ambulatory Visit: Payer: Self-pay

## 2013-06-29 MED ORDER — ALPRAZOLAM 0.5 MG PO TABS: ORAL_TABLET | ORAL | Status: DC

## 2013-07-02 ENCOUNTER — Telehealth: Payer: Self-pay

## 2013-07-02 NOTE — Telephone Encounter (Signed)
fyi

## 2013-07-07 ENCOUNTER — Emergency Department (HOSPITAL_COMMUNITY): Payer: Medicare Other

## 2013-07-07 ENCOUNTER — Encounter (HOSPITAL_COMMUNITY): Payer: Self-pay | Admitting: Emergency Medicine

## 2013-07-07 ENCOUNTER — Inpatient Hospital Stay (HOSPITAL_COMMUNITY)
Admission: EM | Admit: 2013-07-07 | Discharge: 2013-07-09 | DRG: 392 | Disposition: A | Discharge status: Home / Self Care | Payer: Medicare Other | Attending: Internal Medicine | Admitting: Internal Medicine

## 2013-07-07 DIAGNOSIS — E210 Primary hyperparathyroidism: Secondary | ICD-10-CM

## 2013-07-07 DIAGNOSIS — R7309 Other abnormal glucose: Secondary | ICD-10-CM

## 2013-07-07 DIAGNOSIS — E876 Hypokalemia: Secondary | ICD-10-CM | POA: Diagnosis present

## 2013-07-07 DIAGNOSIS — F419 Anxiety disorder, unspecified: Secondary | ICD-10-CM

## 2013-07-07 DIAGNOSIS — Z833 Family history of diabetes mellitus: Secondary | ICD-10-CM

## 2013-07-07 DIAGNOSIS — E559 Vitamin D deficiency, unspecified: Secondary | ICD-10-CM

## 2013-07-07 DIAGNOSIS — K529 Noninfective gastroenteritis and colitis, unspecified: Secondary | ICD-10-CM | POA: Insufficient documentation

## 2013-07-07 DIAGNOSIS — M810 Age-related osteoporosis without current pathological fracture: Secondary | ICD-10-CM | POA: Diagnosis present

## 2013-07-07 DIAGNOSIS — J4489 Other specified chronic obstructive pulmonary disease: Secondary | ICD-10-CM | POA: Diagnosis present

## 2013-07-07 DIAGNOSIS — I2510 Atherosclerotic heart disease of native coronary artery without angina pectoris: Secondary | ICD-10-CM | POA: Diagnosis present

## 2013-07-07 DIAGNOSIS — N179 Acute kidney failure, unspecified: Secondary | ICD-10-CM | POA: Diagnosis present

## 2013-07-07 DIAGNOSIS — E119 Type 2 diabetes mellitus without complications: Secondary | ICD-10-CM | POA: Diagnosis present

## 2013-07-07 DIAGNOSIS — R1032 Left lower quadrant pain: Secondary | ICD-10-CM

## 2013-07-07 DIAGNOSIS — G8929 Other chronic pain: Secondary | ICD-10-CM | POA: Diagnosis present

## 2013-07-07 DIAGNOSIS — E785 Hyperlipidemia, unspecified: Secondary | ICD-10-CM

## 2013-07-07 DIAGNOSIS — F329 Major depressive disorder, single episode, unspecified: Secondary | ICD-10-CM

## 2013-07-07 DIAGNOSIS — Z87891 Personal history of nicotine dependence: Secondary | ICD-10-CM

## 2013-07-07 DIAGNOSIS — I10 Essential (primary) hypertension: Secondary | ICD-10-CM | POA: Diagnosis present

## 2013-07-07 DIAGNOSIS — I739 Peripheral vascular disease, unspecified: Secondary | ICD-10-CM

## 2013-07-07 DIAGNOSIS — J40 Bronchitis, not specified as acute or chronic: Secondary | ICD-10-CM

## 2013-07-07 DIAGNOSIS — M549 Dorsalgia, unspecified: Secondary | ICD-10-CM | POA: Diagnosis present

## 2013-07-07 DIAGNOSIS — K219 Gastro-esophageal reflux disease without esophagitis: Secondary | ICD-10-CM | POA: Diagnosis present

## 2013-07-07 DIAGNOSIS — D751 Secondary polycythemia: Secondary | ICD-10-CM

## 2013-07-07 DIAGNOSIS — R0989 Other specified symptoms and signs involving the circulatory and respiratory systems: Secondary | ICD-10-CM

## 2013-07-07 DIAGNOSIS — Z7982 Long term (current) use of aspirin: Secondary | ICD-10-CM

## 2013-07-07 DIAGNOSIS — E869 Volume depletion, unspecified: Secondary | ICD-10-CM | POA: Diagnosis present

## 2013-07-07 DIAGNOSIS — M5090 Cervical disc disorder, unspecified, unspecified cervical region: Secondary | ICD-10-CM

## 2013-07-07 DIAGNOSIS — Z8249 Family history of ischemic heart disease and other diseases of the circulatory system: Secondary | ICD-10-CM

## 2013-07-07 DIAGNOSIS — J449 Chronic obstructive pulmonary disease, unspecified: Secondary | ICD-10-CM | POA: Diagnosis present

## 2013-07-07 DIAGNOSIS — R109 Unspecified abdominal pain: Secondary | ICD-10-CM | POA: Diagnosis present

## 2013-07-07 DIAGNOSIS — E1151 Type 2 diabetes mellitus with diabetic peripheral angiopathy without gangrene: Secondary | ICD-10-CM

## 2013-07-07 DIAGNOSIS — K5289 Other specified noninfective gastroenteritis and colitis: Principal | ICD-10-CM | POA: Diagnosis present

## 2013-07-07 DIAGNOSIS — J302 Other seasonal allergic rhinitis: Secondary | ICD-10-CM

## 2013-07-07 DIAGNOSIS — R112 Nausea with vomiting, unspecified: Secondary | ICD-10-CM

## 2013-07-07 DIAGNOSIS — R1031 Right lower quadrant pain: Secondary | ICD-10-CM | POA: Diagnosis present

## 2013-07-07 DIAGNOSIS — F3289 Other specified depressive episodes: Secondary | ICD-10-CM | POA: Diagnosis present

## 2013-07-07 DIAGNOSIS — E7800 Pure hypercholesterolemia, unspecified: Secondary | ICD-10-CM | POA: Diagnosis present

## 2013-07-07 DIAGNOSIS — F411 Generalized anxiety disorder: Secondary | ICD-10-CM | POA: Diagnosis present

## 2013-07-07 DIAGNOSIS — H9190 Unspecified hearing loss, unspecified ear: Secondary | ICD-10-CM

## 2013-07-07 LAB — COMPREHENSIVE METABOLIC PANEL
ALK PHOS: 88 U/L (ref 39–117)
ALT: 18 U/L (ref 0–35)
AST: 27 U/L (ref 0–37)
Albumin: 3.7 g/dL (ref 3.5–5.2)
BUN: 40 mg/dL — ABNORMAL HIGH (ref 6–23)
CO2: 26 meq/L (ref 19–32)
Calcium: 9.9 mg/dL (ref 8.4–10.5)
Chloride: 100 mEq/L (ref 96–112)
Creatinine, Ser: 2.19 mg/dL — ABNORMAL HIGH (ref 0.50–1.10)
GFR calc non Af Amer: 22 mL/min — ABNORMAL LOW (ref >90)
GFR, EST AFRICAN AMERICAN: 25 mL/min — AB (ref >90)
Glucose, Bld: 122 mg/dL — ABNORMAL HIGH (ref 70–99)
POTASSIUM: 3.1 meq/L — AB (ref 3.7–5.3)
SODIUM: 140 meq/L (ref 137–147)
TOTAL PROTEIN: 7.9 g/dL (ref 6.0–8.3)
Total Bilirubin: 0.6 mg/dL (ref 0.3–1.2)

## 2013-07-07 LAB — URINALYSIS, ROUTINE W REFLEX MICROSCOPIC
Bilirubin Urine: NEGATIVE
Glucose, UA: NEGATIVE mg/dL
KETONES UR: NEGATIVE mg/dL
Leukocytes, UA: NEGATIVE
NITRITE: NEGATIVE
Protein, ur: 100 mg/dL — AB
Specific Gravity, Urine: 1.025 (ref 1.005–1.030)
Urobilinogen, UA: 0.2 mg/dL (ref 0.0–1.0)
pH: 5.5 (ref 5.0–8.0)

## 2013-07-07 LAB — CBC WITH DIFFERENTIAL/PLATELET
Basophils Absolute: 0 10*3/uL (ref 0.0–0.1)
Basophils Relative: 0 % (ref 0–1)
Eosinophils Absolute: 0 10*3/uL (ref 0.0–0.7)
Eosinophils Relative: 0 % (ref 0–5)
HCT: 45.4 % (ref 36.0–46.0)
Hemoglobin: 14.9 g/dL (ref 12.0–15.0)
LYMPHS ABS: 2.5 10*3/uL (ref 0.7–4.0)
Lymphocytes Relative: 20 % (ref 12–46)
MCH: 29.8 pg (ref 26.0–34.0)
MCHC: 32.8 g/dL (ref 30.0–36.0)
MCV: 90.8 fL (ref 78.0–100.0)
Monocytes Absolute: 1.1 10*3/uL — ABNORMAL HIGH (ref 0.1–1.0)
Monocytes Relative: 9 % (ref 3–12)
Neutro Abs: 8.9 10*3/uL — ABNORMAL HIGH (ref 1.7–7.7)
PLATELETS: 246 10*3/uL (ref 150–400)
RBC: 5 MIL/uL (ref 3.87–5.11)
RDW: 14.6 % (ref 11.5–15.5)
WBC: 12.5 10*3/uL — ABNORMAL HIGH (ref 4.0–10.5)

## 2013-07-07 LAB — TROPONIN I: Troponin I: <0.3 ng/mL (ref <0.30)

## 2013-07-07 LAB — LACTIC ACID, PLASMA: LACTIC ACID, VENOUS: 1 mmol/L (ref 0.5–2.2)

## 2013-07-07 LAB — URINE MICROSCOPIC-ADD ON

## 2013-07-07 LAB — LIPASE, BLOOD: Lipase: 31 U/L (ref 11–59)

## 2013-07-07 MED ORDER — LEVOFLOXACIN IN D5W 500 MG/100ML IV SOLN
500.0000 mg | Freq: Once | INTRAVENOUS | Status: AC
  Administered 2013-07-07: 500 mg via INTRAVENOUS
  Filled 2013-07-07: qty 100

## 2013-07-07 MED ORDER — IOHEXOL 300 MG/ML  SOLN
50.0000 mL | Freq: Once | INTRAMUSCULAR | Status: AC | PRN
  Administered 2013-07-07: 50 mL via ORAL

## 2013-07-07 MED ORDER — METRONIDAZOLE IN NACL 5-0.79 MG/ML-% IV SOLN
500.0000 mg | Freq: Once | INTRAVENOUS | Status: AC
  Administered 2013-07-07: 500 mg via INTRAVENOUS
  Filled 2013-07-07: qty 100

## 2013-07-07 MED ORDER — GI COCKTAIL ~~LOC~~
30.0000 mL | Freq: Once | ORAL | Status: AC
  Administered 2013-07-07: 30 mL via ORAL
  Filled 2013-07-07: qty 30

## 2013-07-07 MED ORDER — SODIUM CHLORIDE 0.9 % IJ SOLN
INTRAMUSCULAR | Status: AC
  Filled 2013-07-07: qty 400

## 2013-07-07 MED ORDER — SODIUM CHLORIDE 0.9 % IV BOLUS (SEPSIS)
1000.0000 mL | Freq: Once | INTRAVENOUS | Status: AC
  Administered 2013-07-07: 1000 mL via INTRAVENOUS

## 2013-07-07 MED ORDER — DIPHENOXYLATE-ATROPINE 2.5-0.025 MG PO TABS
2.0000 | ORAL_TABLET | Freq: Once | ORAL | Status: AC
  Administered 2013-07-07: 2 via ORAL
  Filled 2013-07-07: qty 2

## 2013-07-07 MED ORDER — MORPHINE SULFATE 4 MG/ML IJ SOLN
4.0000 mg | INTRAMUSCULAR | Status: DC | PRN
  Administered 2013-07-07: 4 mg via INTRAVENOUS
  Filled 2013-07-07: qty 1

## 2013-07-07 MED ORDER — ONDANSETRON HCL 4 MG/2ML IJ SOLN
4.0000 mg | Freq: Once | INTRAMUSCULAR | Status: AC
  Administered 2013-07-07: 4 mg via INTRAVENOUS
  Filled 2013-07-07: qty 2

## 2013-07-07 NOTE — ED Provider Notes (Signed)
CSN: [id number]     Arrival date & time 07/07/13  1333 History  This chart was scribed for Rolland Porter, MD by Danella Maiers, ED Scribe. This patient was seen in room APA18/APA18 and the patient's care was started at 3:33 PM.    Chief Complaint  Patient presents with  . Abdominal Pain   The history is provided by the patient. No language interpreter was used.   HPI Comments: Carla [name] is a 68 y.o. female with a h/o HTN and hypercholesterolemia who presents to the Emergency Department complaining of intermittent left-sided abdominal pain onset 2 days ago with nausea vomiting and diarrhea. She states all the symptoms started at the same time. She denies prior h/o similar symptoms. She states she has been unable to eat solid food but has kept down a small amount of Gatorade since the onset of symptoms. She reports weakness "proabably due to not eating".  She reports one episode of bloody diarrhea last night and states the contents were all blood, no stool. She denies fever headaches lightheadedness dizziness or body aches. She reports SOB at baseline but denies changes in breathing this week. She has had a colonoscopy that was normal.    Past Medical History  Diagnosis Date  . ASCVD (arteriosclerotic cardiovascular disease)     No critical disease on cath in 8/97: mild AI with trival, if any l AS; negative stress nuclear in 2010  . Peripheral vascular disease     stauts post aortabifemoral graft    . Tobacco abuse 2010    Continue at 1/2 pack per day   . Chronic bronchitis   . Hypertension   . Fasting hyperglycemia   . Elevated hemoglobin A1c   . Hydronephrosis   . GERD (gastroesophageal reflux disease)   . Back pain   . Depression   . Osteoporosis   . Secondary erythrocytosis 08/05/2012    Secondary to COPD  . Cervical disc herniation    Past Surgical History  Procedure Laterality Date  . Cholecystectomy    . Aorta bifem graft    . Right kidney surgery following damage during  arterial surgery    . Partial hysterectomy    . Total abdominal hysterectomy w/ bilateral salpingoophorectomy  1975  . Lysis of adhesions  2000  . Abdominal hysterectomy     Family History  Problem Relation Age of Onset  . Heart attack Mother   . Heart attack Father   . COPD Sister   . Diabetes Sister   . Diabetes Sister   . Coronary artery disease Sister   . Diabetes Sister   . Heart disease Sister   . COPD Sister    History  Substance Use Topics  . Smoking status: Former Smoker -- 0.50 packs/day    Types: Cigarettes  . Smokeless tobacco: Former Neurosurgeon    Quit date: 05/12/2012  . Alcohol Use: No   OB History   Grav Para Term Preterm Abortions TAB SAB Ect Mult Living                 Review of Systems  Constitutional: Negative for fever, chills, diaphoresis, appetite change and fatigue.  HENT: Negative for mouth sores, sore throat and trouble swallowing.   Eyes: Negative for visual disturbance.  Respiratory: Positive for shortness of breath. Negative for cough, chest tightness and wheezing.   Cardiovascular: Negative for chest pain.  Gastrointestinal: Positive for nausea, vomiting, abdominal pain, diarrhea and blood in stool. Negative for abdominal distention.  Endocrine: Negative for polydipsia, polyphagia and polyuria.  Genitourinary: Negative for dysuria, frequency and hematuria.  Musculoskeletal: Negative for gait problem.  Skin: Negative for color change, pallor and rash.  Neurological: Positive for weakness. Negative for dizziness, syncope, light-headedness and headaches.  Hematological: Does not bruise/bleed easily.  Psychiatric/Behavioral: Negative for behavioral problems and confusion.      Allergies  Review of patient's allergies indicates no known allergies.  Home Medications   Prior to Admission medications   Medication Sig Start Date End Date Taking? Authorizing Provider  alendronate (FOSAMAX) 70 MG tablet Take 1 tablet (70 mg total) by mouth every 7  (seven) days. Take with a full glass of water on an empty stomach. 05/17/13   Fayrene Helper, MD  ALPRAZolam Duanne Moron) 0.5 MG tablet TAKE ONE TABLET BY MOUTH TWICE DAILY. 06/29/13   Fayrene Helper, MD  amLODipine (NORVASC) 5 MG tablet Take 5 mg by mouth daily. 08/11/12   Fayrene Helper, MD  aspirin (ASPIRIN LOW DOSE) 81 MG EC tablet Take 81 mg by mouth daily.     Historical Provider, MD  benzonatate (TESSALON) 100 MG capsule Take 1 capsule (100 mg total) by mouth 3 (three) times daily as needed for cough. 04/12/13   Fayrene Helper, MD  Bisacodyl (CORRECTOL PO) Take 3 tablets by mouth as needed.    Historical Provider, MD  buPROPion (WELLBUTRIN SR) 150 MG 12 hr tablet Take 1 tablet (150 mg total) by mouth 2 (two) times daily. 08/11/12 08/11/13  Fayrene Helper, MD  CRESTOR 40 MG tablet TAKE (1) TABLET BY MOUTH DAILY AT BEDTIME.    Fayrene Helper, MD  docusate sodium (COLACE) 100 MG capsule Take 100 mg by mouth daily as needed.     Historical Provider, MD  fluticasone (FLONASE) 50 MCG/ACT nasal spray Place 2 sprays into both nostrils daily. 03/30/13 03/30/14  Fayrene Helper, MD  HYDROcodone-acetaminophen [facility]) 10-325 MG per tablet One three times daily 06/18/13 07/19/13  Fayrene Helper, MD  metFORMIN (GLUCOPHAGE-XR) 500 MG 24 hr tablet Take 1 tablet (500 mg total) by mouth daily with breakfast. 04/02/13   Fayrene Helper, MD  mirtazapine (REMERON) 7.5 MG tablet Take 1 tablet (7.5 mg total) by mouth at bedtime. 03/30/13 06/29/14  Fayrene Helper, MD  Multiple Vitamin (MULTIVITAMIN WITH MINERALS) TABS Take 1 tablet by mouth every evening. MULTIVITAMIN-OTC to promote LOWER CHOLESTEROL    Historical Provider, MD   BP 137/74  Pulse 109  Temp(Src) 98 F (36.7 C) (Oral)  Resp 17  Ht 5\' 3"  (1.6 m)  Wt 120 lb (54.432 kg)  BMI 21.26 kg/m2  SpO2 100% Physical Exam  Constitutional: She is oriented to person, place, and time. She appears well-developed and well-nourished. No distress.   HENT:  Head: Normocephalic.  Dry mucous membranes  Eyes: Conjunctivae are normal. Pupils are equal, round, and reactive to light. No scleral icterus.  Neck: Normal range of motion. Neck supple. No thyromegaly present.  Cardiovascular: Normal rate and regular rhythm.  Exam reveals no gallop and no friction rub.   No murmur heard. Pulmonary/Chest: Effort normal and breath sounds normal. No respiratory distress. She has no wheezes. She has no rales.  Tenderness in the left lower ribs and breast  Abdominal: Soft. Bowel sounds are normal. She exhibits no distension. There is tenderness (RLQ and suprapubic). There is no rebound.  Musculoskeletal: Normal range of motion.  Neurological: She is alert and oriented to person, place, and time.  Skin: Skin  is warm and dry. No rash noted.  Psychiatric: She has a normal mood and affect. Her behavior is normal.    ED Course  Procedures (including critical care time) Medications  morphine 4 MG/ML injection 4 mg (4 mg Intravenous Given 07/07/13 1639)  sodium chloride 0.9 % injection (not administered)  metroNIDAZOLE (FLAGYL) IVPB 500 mg (not administered)  levofloxacin (LEVAQUIN) IVPB 500 mg (500 mg Intravenous New Bag/Given 07/07/13 2038)  ondansetron (ZOFRAN) injection 4 mg (4 mg Intravenous Given 07/07/13 1639)  diphenoxylate-atropine (LOMOTIL) 2.5-0.025 MG per tablet 2 tablet (2 tablets Oral Given 07/07/13 1639)  sodium chloride 0.9 % bolus 1,000 mL (0 mLs Intravenous Stopped 07/07/13 1759)  iohexol (OMNIPAQUE) 300 MG/ML solution 50 mL (50 mLs Oral Contrast Given 07/07/13 1631)  gi cocktail (Maalox,Lidocaine,Donnatal) (30 mLs Oral Given 07/07/13 1829)  sodium chloride 0.9 % bolus 1,000 mL (1,000 mLs Intravenous New Bag/Given 07/07/13 2038)    DIAGNOSTIC STUDIES: Oxygen Saturation is 100% on RA, normal by my interpretation.    COORDINATION OF CARE: 4:18 PM- Discussed treatment plan with pt which includes IV fluids, pain medication, CT abdomen, blood  work, and UA. Pt agrees to plan.    Labs Review Labs Reviewed  CBC WITH DIFFERENTIAL - Abnormal; Notable for the following:    WBC 12.5 (*)    Neutro Abs 8.9 (*)    Monocytes Absolute 1.1 (*)    All other components within normal limits  COMPREHENSIVE METABOLIC PANEL - Abnormal; Notable for the following:    Potassium 3.1 (*)    Glucose, Bld 122 (*)    BUN 40 (*)    Creatinine, Ser 2.19 (*)    GFR calc non Af Amer 22 (*)    GFR calc Af Amer 25 (*)    All other components within normal limits  URINALYSIS, ROUTINE W REFLEX MICROSCOPIC - Abnormal; Notable for the following:    APPearance HAZY (*)    Hgb urine dipstick LARGE (*)    Protein, ur 100 (*)    All other components within normal limits  URINE MICROSCOPIC-ADD ON - Abnormal; Notable for the following:    Squamous Epithelial / LPF MANY (*)    Bacteria, UA FEW (*)    Casts GRANULAR CAST (*)    All other components within normal limits  LIPASE, BLOOD  TROPONIN I  LACTIC ACID, PLASMA    Imaging Review Ct Abdomen Pelvis Wo Contrast  07/07/2013   CLINICAL DATA:  Abdominal pain and nausea.  EXAM: CT ABDOMEN AND PELVIS WITHOUT CONTRAST  TECHNIQUE: Multidetector CT imaging of the abdomen and pelvis was performed following the standard protocol without intravenous contrast.  COMPARISON:  05/01/2012  FINDINGS: The lung bases are clear. The heart is normal in size. No pericardial effusion. Coronary artery calcifications are noted. The distal esophagus is grossly normal. Moderate aortic calcifications.  The unenhanced appearance of the liver is grossly normal. There is moderate common bile duct dilatation likely due to prior cholecystectomy. This is a stable finding. It measures a maximum of 15 mm. The pancreas is unremarkable. The spleen is normal in size. No focal lesions. The adrenal glands and kidneys are unremarkable except for chronic right UPJ obstruction and moderate hydronephrosis. Surgical clips are noted in the right  retroperitoneum.  The stomach, duodenum and small bowel are unremarkable. The terminal ileum is normal. The right in transverse colon are dilated and stool filled. The left colon demonstrates moderate diffuse wall thickening consistent with colitis. This could be inflammatory, infectious or ischemic.  No pneumatosis. The sigmoid colon and rectum are grossly normal.  There are not dense atherosclerotic calcifications involving the upper abdominal aorta. There is an aortoiliac bypass graft which appears normal.  The bladder is unremarkable. The uterus is surgically absent. There is a small amount of free pelvic fluid. No pelvic mass or adenopathy. No inguinal mass or adenopathy.  The bony structures are unremarkable.  IMPRESSION: Left-sided colonic wall thickening likely due to an infectious or inflammatory process. Ischemic colitis is also possible.  Stable common bile duct dilatation.  Stable right UPJ obstruction   Electronically Signed   By: Kalman Jewels M.D.   On: 07/07/2013 18:59     EKG Interpretation None      MDM   Final diagnoses:  Colitis    DD shows thickening. Bloody stools yesterday. Her left lower quadrant. Sentences with colitis. Pain is easy to control and not out of proportion. No history of significant cardiovascular disease, thus I doubt ischemic colitis. She is showing signs of acute kidney injury. Is very likely prerenal secondary to her fluid loss with vomiting and diarrhea. His Levaquin and Flagyl IV because or vomiting. Care discussed with Dr.David.  Plan is admission.  I personally performed the services described in this documentation, which was scribed in my presence. The recorded information has been reviewed and is accurate.   Tanna Furry, MD 07/07/13 2131

## 2013-07-07 NOTE — H&P (Signed)
PCP:   Tula Nakayama, MD   Chief Complaint: n /v/d abd pain  HPI: 68 yo female with 2 days of n/v/d with worsening llq abd pain.  She had over ten episodes of vomiting yesterday and the day before but this is better today.  Also with several days of a lot of diarrhea with some blood in it today.  Diarrhea is also improving.  She has not been able to hold anything down.  No fevers.  Feeling awful.  abd pain in llq.  Never had colitis before.  Is feeling better since arrival to ED with ivf, zofran and morphine.    Review of Systems:  Positive and negative as per HPI otherwise all other systems are negative  Past Medical History: Past Medical History  Diagnosis Date  . ASCVD (arteriosclerotic cardiovascular disease)     No critical disease on cath in 8/97: mild AI with trival, if any l AS; negative stress nuclear in 2010  . Peripheral vascular disease     stauts post aortabifemoral graft    . Tobacco abuse 2010    Continue at 1/2 pack per day   . Chronic bronchitis   . Hypertension   . Fasting hyperglycemia   . Elevated hemoglobin A1c   . Hydronephrosis   . GERD (gastroesophageal reflux disease)   . Back pain   . Depression   . Osteoporosis   . Secondary erythrocytosis 08/05/2012    Secondary to COPD  . Cervical disc herniation    Past Surgical History  Procedure Laterality Date  . Cholecystectomy    . Aorta bifem graft    . Right kidney surgery following damage during arterial surgery    . Partial hysterectomy    . Total abdominal hysterectomy w/ bilateral salpingoophorectomy  1975  . Lysis of adhesions  2000  . Abdominal hysterectomy      Medications: Prior to Admission medications   Medication Sig Start Date End Date Taking? Authorizing Provider  alendronate (FOSAMAX) 70 MG tablet Take 70 mg by mouth every Sunday. Take with a full glass of water on an empty stomach.   Yes Historical Provider, MD  amLODipine (NORVASC) 5 MG tablet Take 5 mg by mouth daily. 08/11/12   Yes Fayrene Helper, MD  aspirin EC 81 MG tablet Take 81 mg by mouth daily.   Yes Historical Provider, MD  CALCIUM PO Take 1 tablet by mouth daily.   Yes Historical Provider, MD  Cholecalciferol (VITAMIN D) 2000 UNITS CAPS Take 1 capsule by mouth daily.   Yes Historical Provider, MD  docusate sodium (CORRECTOL EXTRA GENTLE) 100 MG capsule Take 300 mg by mouth daily as needed for mild constipation or moderate constipation.   Yes Historical Provider, MD  HYDROcodone-acetaminophen (NORCO) 10-325 MG per tablet Take 1 tablet by mouth 3 (three) times daily.   Yes Historical Provider, MD  metFORMIN (GLUCOPHAGE-XR) 500 MG 24 hr tablet Take 500 mg by mouth daily with breakfast.   Yes Historical Provider, MD  Multiple Vitamin (MULTIVITAMIN WITH MINERALS) TABS Take 1 tablet by mouth every evening. MULTIVITAMIN-OTC to promote LOWER CHOLESTEROL   Yes Historical Provider, MD  Omega-3 Fatty Acids (FISH OIL PO) Take 1 capsule by mouth daily.   Yes Historical Provider, MD  rosuvastatin (CRESTOR) 40 MG tablet Take 40 mg by mouth at bedtime.   Yes Historical Provider, MD    Allergies:  No Known Allergies  Social History:  reports that she has quit smoking. Her smoking use included Cigarettes. She  smoked 0.50 packs per day. She quit smokeless tobacco use about 13 months ago. She reports that she does not drink alcohol or use illicit drugs.  Family History: Family History  Problem Relation Age of Onset  . Heart attack Mother   . Heart attack Father   . COPD Sister   . Diabetes Sister   . Diabetes Sister   . Coronary artery disease Sister   . Diabetes Sister   . Heart disease Sister   . COPD Sister     Physical Exam: Filed Vitals:   07/07/13 1649 07/07/13 1700 07/07/13 1800 07/07/13 1900  BP: 132/70 125/62 144/68 126/56  Pulse: 83 85  74  Temp:      TempSrc:      Resp: 18 17  17   Height:      Weight:      SpO2: 96% 100%  99%   General appearance: alert, cooperative and no distress Head:  Normocephalic, without obvious abnormality, atraumatic Eyes: negative Nose: Nares normal. Septum midline. Mucosa normal. No drainage or sinus tenderness. Neck: no JVD and supple, symmetrical, trachea midline Lungs: clear to auscultation bilaterally Heart: regular rate and rhythm, S1, S2 normal, no murmur, click, rub or gallop Abdomen: soft, llq ttp, mild distention, pos bs no r/g nonacute abd Extremities: extremities normal, atraumatic, no cyanosis or edema Pulses: 2+ and symmetric Skin: Skin color, texture, turgor normal. No rashes or lesions Neurologic: Grossly normal    Labs on Admission:   Recent Labs  07/07/13 1622  NA 140  K 3.1*  CL 100  CO2 26  GLUCOSE 122*  BUN 40*  CREATININE 2.19*  CALCIUM 9.9    Recent Labs  07/07/13 1622  AST 27  ALT 18  ALKPHOS 88  BILITOT 0.6  PROT 7.9  ALBUMIN 3.7    Recent Labs  07/07/13 1622  LIPASE 31    Recent Labs  07/07/13 1622  WBC 12.5*  NEUTROABS 8.9*  HGB 14.9  HCT 45.4  MCV 90.8  PLT 246    Recent Labs  07/07/13 1916  TROPONINI <0.30   Radiological Exams on Admission: Ct Abdomen Pelvis Wo Contrast  07/07/2013   CLINICAL DATA:  Abdominal pain and nausea.  EXAM: CT ABDOMEN AND PELVIS WITHOUT CONTRAST  TECHNIQUE: Multidetector CT imaging of the abdomen and pelvis was performed following the standard protocol without intravenous contrast.  COMPARISON:  05/01/2012  FINDINGS: The lung bases are clear. The heart is normal in size. No pericardial effusion. Coronary artery calcifications are noted. The distal esophagus is grossly normal. Moderate aortic calcifications.  The unenhanced appearance of the liver is grossly normal. There is moderate common bile duct dilatation likely due to prior cholecystectomy. This is a stable finding. It measures a maximum of 15 mm. The pancreas is unremarkable. The spleen is normal in size. No focal lesions. The adrenal glands and kidneys are unremarkable except for chronic right UPJ  obstruction and moderate hydronephrosis. Surgical clips are noted in the right retroperitoneum.  The stomach, duodenum and small bowel are unremarkable. The terminal ileum is normal. The right in transverse colon are dilated and stool filled. The left colon demonstrates moderate diffuse wall thickening consistent with colitis. This could be inflammatory, infectious or ischemic. No pneumatosis. The sigmoid colon and rectum are grossly normal.  There are not dense atherosclerotic calcifications involving the upper abdominal aorta. There is an aortoiliac bypass graft which appears normal.  The bladder is unremarkable. The uterus is surgically absent. There is a small amount  of free pelvic fluid. No pelvic mass or adenopathy. No inguinal mass or adenopathy.  The bony structures are unremarkable.  IMPRESSION: Left-sided colonic wall thickening likely due to an infectious or inflammatory process. Ischemic colitis is also possible.  Stable common bile duct dilatation.  Stable right UPJ obstruction   Electronically Signed   By: Kalman Jewels M.D.   On: 07/07/2013 18:59    Assessment/Plan  68 yo female with acute colitis and AKI from volume depletion  Principal Problem:   Acute colitis-  Place on iv levaquin and flagyl.  Ck stool cx.  abd exam benign.  Clear liq diet.  Should improve over next couple of days with iv abx.  Iv zofran and morphine prn.  Active Problems:   Anxiety and depression   HYPERTENSION   BACK PAIN, CHRONIC   Emphysema with chronic bronchitis   Abdominal pain, acute, left lower quadrant   Nausea & vomiting   Acute renal failure-  Likely prerenal from volume losses.  Ivf.  Repeat in am.  Admit to med bed.  Full code.  Rachal A Shanon Brow 07/07/2013, 9:10 PM

## 2013-07-07 NOTE — ED Notes (Signed)
Pt co lt sided abdominal pain since Sunday, N/V/D x3 days, denies emesis at this time but still co nausea.

## 2013-07-08 DIAGNOSIS — N179 Acute kidney failure, unspecified: Secondary | ICD-10-CM

## 2013-07-08 DIAGNOSIS — R52 Pain, unspecified: Secondary | ICD-10-CM

## 2013-07-08 DIAGNOSIS — K5289 Other specified noninfective gastroenteritis and colitis: Principal | ICD-10-CM

## 2013-07-08 DIAGNOSIS — R1032 Left lower quadrant pain: Secondary | ICD-10-CM

## 2013-07-08 DIAGNOSIS — F341 Dysthymic disorder: Secondary | ICD-10-CM

## 2013-07-08 LAB — GLUCOSE, CAPILLARY
Glucose-Capillary: 102 mg/dL — ABNORMAL HIGH (ref 70–99)
Glucose-Capillary: 101 mg/dL — ABNORMAL HIGH (ref 70–99)
Glucose-Capillary: 105 mg/dL — ABNORMAL HIGH (ref 70–99)

## 2013-07-08 LAB — CBC
HCT: 39.4 % (ref 36.0–46.0)
HEMOGLOBIN: 12.6 g/dL (ref 12.0–15.0)
MCH: 29.5 pg (ref 26.0–34.0)
MCHC: 32 g/dL (ref 30.0–36.0)
MCV: 92.3 fL (ref 78.0–100.0)
Platelets: 178 10*3/uL (ref 150–400)
RBC: 4.27 MIL/uL (ref 3.87–5.11)
RDW: 14.5 % (ref 11.5–15.5)
WBC: 6 10*3/uL (ref 4.0–10.5)

## 2013-07-08 LAB — BASIC METABOLIC PANEL
BUN: 36 mg/dL — ABNORMAL HIGH (ref 6–23)
CO2: 24 mEq/L (ref 19–32)
Calcium: 8.4 mg/dL (ref 8.4–10.5)
Chloride: 103 mEq/L (ref 96–112)
Creatinine, Ser: 1.96 mg/dL — ABNORMAL HIGH (ref 0.50–1.10)
GFR, EST AFRICAN AMERICAN: 29 mL/min — AB (ref >90)
GFR, EST NON AFRICAN AMERICAN: 25 mL/min — AB (ref >90)
Glucose, Bld: 110 mg/dL — ABNORMAL HIGH (ref 70–99)
POTASSIUM: 3.6 meq/L — AB (ref 3.7–5.3)
Sodium: 139 mEq/L (ref 137–147)

## 2013-07-08 LAB — HEMOGLOBIN A1C
HEMOGLOBIN A1C: 6.5 % — AB (ref <5.7)
MEAN PLASMA GLUCOSE: 140 mg/dL — AB (ref <117)

## 2013-07-08 MED ORDER — ALUM & MAG HYDROXIDE-SIMETH 200-200-20 MG/5ML PO SUSP
30.0000 mL | Freq: Four times a day (QID) | ORAL | Status: DC | PRN
  Administered 2013-07-08: 30 mL via ORAL
  Filled 2013-07-08: qty 30

## 2013-07-08 MED ORDER — LEVOFLOXACIN IN D5W 250 MG/50ML IV SOLN
250.0000 mg | INTRAVENOUS | Status: DC
  Administered 2013-07-08: 250 mg via INTRAVENOUS
  Filled 2013-07-08: qty 50

## 2013-07-08 MED ORDER — MORPHINE SULFATE 2 MG/ML IJ SOLN: 2.0000 mg | INTRAMUSCULAR | Status: DC | PRN

## 2013-07-08 MED ORDER — POTASSIUM CHLORIDE IN NACL 20-0.9 MEQ/L-% IV SOLN
INTRAVENOUS | Status: DC
  Administered 2013-07-08 – 2013-07-09 (×2): via INTRAVENOUS

## 2013-07-08 MED ORDER — ONDANSETRON HCL 4 MG PO TABS: 4.0000 mg | ORAL_TABLET | Freq: Four times a day (QID) | ORAL | Status: DC | PRN

## 2013-07-08 MED ORDER — INSULIN ASPART 100 UNIT/ML ~~LOC~~ SOLN: 0.0000 [IU] | Freq: Every day | SUBCUTANEOUS | Status: DC

## 2013-07-08 MED ORDER — ONDANSETRON HCL 4 MG/2ML IJ SOLN: 4.0000 mg | Freq: Four times a day (QID) | INTRAMUSCULAR | Status: DC | PRN

## 2013-07-08 MED ORDER — ASPIRIN EC 81 MG PO TBEC
81.0000 mg | DELAYED_RELEASE_TABLET | Freq: Every day | ORAL | Status: DC
  Administered 2013-07-08 – 2013-07-09 (×2): 81 mg via ORAL
  Filled 2013-07-08 (×2): qty 1

## 2013-07-08 MED ORDER — AMLODIPINE BESYLATE 5 MG PO TABS
5.0000 mg | ORAL_TABLET | Freq: Every day | ORAL | Status: DC
  Administered 2013-07-08 – 2013-07-09 (×2): 5 mg via ORAL
  Filled 2013-07-08 (×2): qty 1

## 2013-07-08 MED ORDER — INSULIN ASPART 100 UNIT/ML ~~LOC~~ SOLN: 0.0000 [IU] | Freq: Three times a day (TID) | SUBCUTANEOUS | Status: DC

## 2013-07-08 MED ORDER — LEVOFLOXACIN IN D5W 500 MG/100ML IV SOLN: 500.0000 mg | INTRAVENOUS | Status: DC

## 2013-07-08 MED ORDER — ALPRAZOLAM 0.5 MG PO TABS
0.5000 mg | ORAL_TABLET | Freq: Once | ORAL | Status: AC
  Administered 2013-07-08: 0.5 mg via ORAL
  Filled 2013-07-08: qty 1

## 2013-07-08 MED ORDER — ALPRAZOLAM 1 MG PO TABS
1.0000 mg | ORAL_TABLET | Freq: Once | ORAL | Status: AC
  Administered 2013-07-08: 1 mg via ORAL
  Filled 2013-07-08: qty 1

## 2013-07-08 MED ORDER — CALCIUM CARBONATE ANTACID 500 MG PO CHEW
1.0000 | CHEWABLE_TABLET | Freq: Two times a day (BID) | ORAL | Status: DC | PRN
  Administered 2013-07-08: 200 mg via ORAL
  Filled 2013-07-08: qty 1

## 2013-07-08 MED ORDER — POTASSIUM CHLORIDE IN NACL 20-0.9 MEQ/L-% IV SOLN
INTRAVENOUS | Status: DC
  Administered 2013-07-08: 02:00:00 via INTRAVENOUS

## 2013-07-08 MED ORDER — METRONIDAZOLE IN NACL 5-0.79 MG/ML-% IV SOLN
500.0000 mg | Freq: Three times a day (TID) | INTRAVENOUS | Status: DC
  Administered 2013-07-08 – 2013-07-09 (×4): 500 mg via INTRAVENOUS
  Filled 2013-07-08 (×4): qty 100

## 2013-07-08 NOTE — Progress Notes (Signed)
Patient ID: Carla [name]  female  UYQ:[id number]    DOB: [birth year]    DOA: 07/07/2013  PCP: Carla Nakayama, MD  Assessment/Plan: Principal Problem:   Acute colitis: Presenting with nausea, vomiting, left lower quadrant abdominal pain - Improving, started on full liquid diet today - Continue IV fluids, pain control, advanced as tolerated to solids - Continue IV and Flagyl  Active Problems:   Diabetes mellitus - Placed on sliding scale insulin while inpatient    HYPERTENSION - Continue Norvasc    BACK PAIN, CHRONIC -Currently stable, continue pain control     Emphysema with chronic bronchitis - Currently stable    Acute renal failureWith hypokalemia - Continue IV fluid hydration for at least another 24 hours   DVT Prophylaxis: SCD  Code Status:  Family Communication:  Disposition:Hopefully tomorrow  Consultants:  None  Procedures:  None  Antibiotics:  IV Levaquin and Flagyl 3/15    Subjective: States that nausea vomiting and abdominal pain has significantly improved from admission, wants to drink Carla [name]  Objective: Weight change:  No intake or output data in the 24 hours ending 07/08/13 1033 Blood pressure 110/78, pulse 84, temperature 97.8 F (36.6 C), temperature source Oral, resp. rate 18, height 5\' 3"  (1.6 m), weight 61.9 kg (136 lb 7.4 oz), SpO2 94.00%.  Physical Exam: General: Alert and awake, oriented x3, not in any acute distress. CVS: S1-S2 clear, no murmur rubs or gallops Chest: clear to auscultation bilaterally, no wheezing, rales or rhonchi Abdomen: soft  minimal tenderness in the left lower quadrant , mild distended, normal bowel sounds  Extremities: no cyanosis, clubbing or edema noted bilaterally Neuro: Cranial nerves II-XII intact, no focal neurological deficits  Lab Results: Basic Metabolic Panel:  Recent Labs Lab 07/07/13 1622 07/08/13 0428  NA 140 139  K 3.1* 3.6*  CL 100 103  CO2 26 24  GLUCOSE 122* 110*    BUN 40* 36*  CREATININE 2.19* 1.96*  CALCIUM 9.9 8.4   Liver Function Tests:  Recent Labs Lab 07/07/13 1622  AST 27  ALT 18  ALKPHOS 88  BILITOT 0.6  PROT 7.9  ALBUMIN 3.7    Recent Labs Lab 07/07/13 1622  LIPASE 31   No results found for this basename: AMMONIA,  in the last 168 hours CBC:  Recent Labs Lab 07/07/13 1622 07/08/13 0428  WBC 12.5* 6.0  NEUTROABS 8.9*  --   HGB 14.9 12.6  HCT 45.4 39.4  MCV 90.8 92.3  PLT 246 178   Cardiac Enzymes:  Recent Labs Lab 07/07/13 1916  TROPONINI <0.30   BNP: No components found with this basename: POCBNP,  CBG: No results found for this basename: GLUCAP,  in the last 168 hours   Micro Results: No results found for this or any previous visit (from the past 240 hour(s)).  Studies/Results: Ct Abdomen Pelvis Wo Contrast  07/07/2013   CLINICAL DATA:  Abdominal pain and nausea.  EXAM: CT ABDOMEN AND PELVIS WITHOUT CONTRAST  TECHNIQUE: Multidetector CT imaging of the abdomen and pelvis was performed following the standard protocol without intravenous contrast.  COMPARISON:  05/01/2012  FINDINGS: The lung bases are clear. The heart is normal in size. No pericardial effusion. Coronary artery calcifications are noted. The distal esophagus is grossly normal. Moderate aortic calcifications.  The unenhanced appearance of the liver is grossly normal. There is moderate common bile duct dilatation likely due to prior cholecystectomy. This is a stable finding. It measures a maximum of 15 mm. The  pancreas is unremarkable. The spleen is normal in size. No focal lesions. The adrenal glands and kidneys are unremarkable except for chronic right UPJ obstruction and moderate hydronephrosis. Surgical clips are noted in the right retroperitoneum.  The stomach, duodenum and small bowel are unremarkable. The terminal ileum is normal. The right in transverse colon are dilated and stool filled. The left colon demonstrates moderate diffuse wall  thickening consistent with colitis. This could be inflammatory, infectious or ischemic. No pneumatosis. The sigmoid colon and rectum are grossly normal.  There are not dense atherosclerotic calcifications involving the upper abdominal aorta. There is an aortoiliac bypass graft which appears normal.  The bladder is unremarkable. The uterus is surgically absent. There is a small amount of free pelvic fluid. No pelvic mass or adenopathy. No inguinal mass or adenopathy.  The bony structures are unremarkable.  IMPRESSION: Left-sided colonic wall thickening likely due to an infectious or inflammatory process. Ischemic colitis is also possible.  Stable common bile duct dilatation.  Stable right UPJ obstruction   Electronically Signed   By: Carla [name] M.D.   On: 07/07/2013 18:59    Medications: Scheduled Meds: . amLODipine  5 mg Oral Daily  . aspirin EC  81 mg Oral Daily  . levofloxacin (LEVAQUIN) IV  500 mg Intravenous Q24H  . metronidazole  500 mg Intravenous Q8H      LOS: 1 day   Carla [name] Carla [name] M.D. [facility] 07/08/2013, 10:33 AM Pager: [phone]  If 7PM-7AM, please contact night-coverage [email or web address] Password [email or web address]  **Disclaimer: This note was dictated with voice recognition software. Similar sounding words can inadvertently be transcribed and this note may contain transcription errors which may not have been corrected upon publication of note.**

## 2013-07-08 NOTE — Progress Notes (Signed)
68 yo F started on Levaquin & Flagyl for colitis.  Patient has nausea/vomiting.   Chronic kidney injury noted.  Estimated CrCl ~ 20-81ml/min.   Levaquin dose adjusted to 250mg  IV daily for CrCl 20-54ml/min per manufacturer recommendations.  Change to po once appropriate.

## 2013-07-09 LAB — GLUCOSE, CAPILLARY: GLUCOSE-CAPILLARY: 108 mg/dL — AB (ref 70–99)

## 2013-07-09 LAB — BASIC METABOLIC PANEL
BUN: 27 mg/dL — ABNORMAL HIGH (ref 6–23)
CO2: 21 meq/L (ref 19–32)
Calcium: 8.7 mg/dL (ref 8.4–10.5)
Chloride: 111 mEq/L (ref 96–112)
Creatinine, Ser: 1.83 mg/dL — ABNORMAL HIGH (ref 0.50–1.10)
GFR calc Af Amer: 32 mL/min — ABNORMAL LOW (ref >90)
GFR calc non Af Amer: 27 mL/min — ABNORMAL LOW (ref >90)
GLUCOSE: 115 mg/dL — AB (ref 70–99)
POTASSIUM: 4 meq/L (ref 3.7–5.3)
SODIUM: 142 meq/L (ref 137–147)

## 2013-07-09 MED ORDER — METRONIDAZOLE 500 MG PO TABS: 500.0000 mg | ORAL_TABLET | Freq: Three times a day (TID) | ORAL | Status: DC

## 2013-07-09 MED ORDER — PROMETHAZINE HCL 12.5 MG PO TABS: 12.5000 mg | ORAL_TABLET | Freq: Four times a day (QID) | ORAL | Status: DC | PRN

## 2013-07-09 MED ORDER — LEVOFLOXACIN 250 MG PO TABS: 250.0000 mg | ORAL_TABLET | Freq: Every day | ORAL | Status: DC

## 2013-07-09 MED ORDER — LEVOFLOXACIN 250 MG PO TABS
250.0000 mg | ORAL_TABLET | Freq: Every day | ORAL | Status: DC
  Administered 2013-07-09: 250 mg via ORAL
  Filled 2013-07-09: qty 1

## 2013-07-09 MED ORDER — CIPROFLOXACIN HCL 250 MG PO TABS: 500.0000 mg | ORAL_TABLET | Freq: Two times a day (BID) | ORAL | Status: DC

## 2013-07-09 NOTE — Discharge Summary (Signed)
Physician Discharge Summary  Patient ID: Carla [name] MRN: [id number] DOB/AGE: 11/05/1945 68 y.o.  Admit date: 07/07/2013 Discharge date: 07/09/2013  Primary Care Physician:  Tula Nakayama, MD  Discharge Diagnoses:   . Acute colitis . Acute kidney injury  . HYPERTENSION . Emphysema with chronic bronchitis . BACK PAIN, CHRONIC . Anxiety and depression . Abdominal pain, acute, left lower quadrant . Nausea & vomiting   Consults: None   Recommendations for Outpatient Follow-up:  Please obtain BMET at the time of follow up for creatinine function  Allergies:  No Known Allergies   Discharge Medications:   Medication List         alendronate 70 MG tablet  Commonly known as:  FOSAMAX  Take 70 mg by mouth every Sunday. Take with a full glass of water on an empty stomach.     amLODipine 5 MG tablet  Commonly known as:  NORVASC  Take 5 mg by mouth daily.     aspirin EC 81 MG tablet  Take 81 mg by mouth daily.     CALCIUM PO  Take 1 tablet by mouth daily.     CORRECTOL EXTRA GENTLE 100 MG capsule  Generic drug:  docusate sodium  Take 300 mg by mouth daily as needed for mild constipation or moderate constipation.     FISH OIL PO  Take 1 capsule by mouth daily.     HYDROcodone-acetaminophen 10-325 MG per tablet  Commonly known as:  NORCO  Take 1 tablet by mouth 3 (three) times daily.     levofloxacin 250 MG tablet  Commonly known as:  LEVAQUIN  Take 1 tablet (250 mg total) by mouth daily. X 10days     metFORMIN 500 MG 24 hr tablet  Commonly known as:  GLUCOPHAGE-XR  Take 500 mg by mouth daily with breakfast.     metroNIDAZOLE 500 MG tablet  Commonly known as:  FLAGYL  Take 1 tablet (500 mg total) by mouth 3 (three) times daily. X 10days     multivitamin with minerals Tabs tablet  Take 1 tablet by mouth every evening. MULTIVITAMIN-OTC to promote LOWER CHOLESTEROL     promethazine 12.5 MG tablet  Commonly known as:  PHENERGAN  Take 1 tablet (12.5 mg  total) by mouth every 6 (six) hours as needed for nausea or vomiting.     rosuvastatin 40 MG tablet  Commonly known as:  CRESTOR  Take 40 mg by mouth at bedtime.     Vitamin D 2000 UNITS Caps  Take 1 capsule by mouth daily.         Brief H and P: For complete details please refer to admission H and P, but in brief 68 yo female with 2 days of n/v/d with worsening llq abd pain. She had over ten episodes of vomiting a day prior to admission and the day before but was improving at the time of admission. Also with several days of a lot of diarrhea with some blood in it. Diarrhea is also improving. She has not been able to hold anything down. No fevers. Feeling awful. abd pain in llq. Never had colitis before. Is feeling better since arrival to ED with ivf, zofran and morphine.   Hospital Course:  Acute colitis: She presented with nausea and vomiting with diarrhea, left lower quadrant abdominal pain. CT of the pelvis showed left-sided colonic wall thickening due to infectious or inflammatory process. She was placed on  a liquid diet and advanced to solids, which she is  tolerating at discharge. She was placed on IV fluids, pain control, IV Levaquin and Flagyl which were transitioned to oral antibiotics to continue for 10 more days   Diabetes mellitus - Placed on sliding scale insulin while inpatient   HYPERTENSION - Continue Norvasc   BACK PAIN, CHRONIC -Currently stable, continue pain control   Day of Discharge BP 111/66  Pulse 78  Temp(Src) 97.6 F (36.4 C) (Oral)  Resp 20  Ht 5\' 3"  (1.6 m)  Wt 61.9 kg (136 lb 7.4 oz)  BMI 24.18 kg/m2  SpO2 96%  Physical Exam: General: Alert and awake oriented x3 not in any acute distress. CVS: S1-S2 clear no murmur rubs or gallops Chest: clear to auscultation bilaterally, no wheezing rales or rhonchi Abdomen: soft nontender, nondistended, normal bowel sounds Extremities: no cyanosis, clubbing or edema noted bilaterally    The results of  significant diagnostics from this hospitalization (including imaging, microbiology, ancillary and laboratory) are listed below for reference.    LAB RESULTS: Basic Metabolic Panel:  Recent Labs Lab 07/08/13 0428 07/09/13 0532  NA 139 142  K 3.6* 4.0  CL 103 111  CO2 24 21  GLUCOSE 110* 115*  BUN 36* 27*  CREATININE 1.96* 1.83*  CALCIUM 8.4 8.7   Liver Function Tests:  Recent Labs Lab 07/07/13 1622  AST 27  ALT 18  ALKPHOS 88  BILITOT 0.6  PROT 7.9  ALBUMIN 3.7    Recent Labs Lab 07/07/13 1622  LIPASE 31   No results found for this basename: AMMONIA,  in the last 168 hours CBC:  Recent Labs Lab 07/07/13 1622 07/08/13 0428  WBC 12.5* 6.0  NEUTROABS 8.9*  --   HGB 14.9 12.6  HCT 45.4 39.4  MCV 90.8 92.3  PLT 246 178   Cardiac Enzymes:  Recent Labs Lab 07/07/13 1916  TROPONINI <0.30   BNP: No components found with this basename: POCBNP,  CBG:  Recent Labs Lab 07/08/13 2126 07/09/13 0715  GLUCAP 105* 108*    Significant Diagnostic Studies:  Ct Abdomen Pelvis Wo Contrast  07/07/2013   CLINICAL DATA:  Abdominal pain and nausea.  EXAM: CT ABDOMEN AND PELVIS WITHOUT CONTRAST  TECHNIQUE: Multidetector CT imaging of the abdomen and pelvis was performed following the standard protocol without intravenous contrast.  COMPARISON:  05/01/2012  FINDINGS: The lung bases are clear. The heart is normal in size. No pericardial effusion. Coronary artery calcifications are noted. The distal esophagus is grossly normal. Moderate aortic calcifications.  The unenhanced appearance of the liver is grossly normal. There is moderate common bile duct dilatation likely due to prior cholecystectomy. This is a stable finding. It measures a maximum of 15 mm. The pancreas is unremarkable. The spleen is normal in size. No focal lesions. The adrenal glands and kidneys are unremarkable except for chronic right UPJ obstruction and moderate hydronephrosis. Surgical clips are noted in the  right retroperitoneum.  The stomach, duodenum and small bowel are unremarkable. The terminal ileum is normal. The right in transverse colon are dilated and stool filled. The left colon demonstrates moderate diffuse wall thickening consistent with colitis. This could be inflammatory, infectious or ischemic. No pneumatosis. The sigmoid colon and rectum are grossly normal.  There are not dense atherosclerotic calcifications involving the upper abdominal aorta. There is an aortoiliac bypass graft which appears normal.  The bladder is unremarkable. The uterus is surgically absent. There is a small amount of free pelvic fluid. No pelvic mass or adenopathy. No inguinal mass or adenopathy.  The bony structures are unremarkable.  IMPRESSION: Left-sided colonic wall thickening likely due to an infectious or inflammatory process. Ischemic colitis is also possible.  Stable common bile duct dilatation.  Stable right UPJ obstruction   Electronically Signed   By: Kalman Jewels M.D.   On: 07/07/2013 18:59       Disposition and Follow-up: Discharge Orders   Future Appointments Provider Department Dept Phone   07/19/2013 1:00 PM Fayrene Helper, MD [facility] [phone]   Patient should bring all necessary paperwork to be completed.  Arrive 15 minutes prior to the appointment.   07/29/2013 3:45 PM Fayrene Helper, MD [facility] [phone]   Future Orders Complete By Expires   Diet Carb Modified  As directed    Increase activity slowly  As directed        DISPOSITION:  Home   DIET: carb modified    DISCHARGE FOLLOW-UP Follow-up Information   Follow up with Tula Nakayama, MD On 07/29/2013. (at 3:45 pm)    Specialty:  Family Medicine   Contact information:   [address], [name] [address] [phone]       Time spent on Discharge: 35 mins  Signed:   Mendel Corning M.D. [facility] 07/09/2013, 10:02 AM Pager:  [phone]   **Disclaimer: This note was dictated with voice recognition software. Similar sounding words can inadvertently be transcribed and this note may contain transcription errors which may not have been corrected upon publication of note.**

## 2013-07-09 NOTE — Progress Notes (Signed)
Patient being d/c home with prescriptions. IV cath removed and intact. No pain/swelling at site. Patient has minimal amount of pain prior to discharge. Verbalizes understanding of instructions and importance of prescriptions.

## 2013-07-14 ENCOUNTER — Emergency Department (HOSPITAL_COMMUNITY)
Admission: EM | Admit: 2013-07-14 | Discharge: 2013-07-14 | Disposition: A | Discharge status: Home / Self Care | Payer: Medicare Other | Attending: Emergency Medicine | Admitting: Emergency Medicine

## 2013-07-14 ENCOUNTER — Emergency Department (HOSPITAL_COMMUNITY): Payer: Medicare Other

## 2013-07-14 ENCOUNTER — Encounter (HOSPITAL_COMMUNITY): Payer: Self-pay | Admitting: Emergency Medicine

## 2013-07-14 ENCOUNTER — Ambulatory Visit: Payer: Medicare Other | Admitting: Gastroenterology

## 2013-07-14 DIAGNOSIS — Z792 Long term (current) use of antibiotics: Secondary | ICD-10-CM | POA: Insufficient documentation

## 2013-07-14 DIAGNOSIS — Z87448 Personal history of other diseases of urinary system: Secondary | ICD-10-CM | POA: Insufficient documentation

## 2013-07-14 DIAGNOSIS — M810 Age-related osteoporosis without current pathological fracture: Secondary | ICD-10-CM | POA: Insufficient documentation

## 2013-07-14 DIAGNOSIS — Z8659 Personal history of other mental and behavioral disorders: Secondary | ICD-10-CM | POA: Diagnosis not present

## 2013-07-14 DIAGNOSIS — Z79899 Other long term (current) drug therapy: Secondary | ICD-10-CM | POA: Diagnosis not present

## 2013-07-14 DIAGNOSIS — E876 Hypokalemia: Secondary | ICD-10-CM | POA: Diagnosis not present

## 2013-07-14 DIAGNOSIS — M1990 Unspecified osteoarthritis, unspecified site: Secondary | ICD-10-CM

## 2013-07-14 DIAGNOSIS — Z951 Presence of aortocoronary bypass graft: Secondary | ICD-10-CM | POA: Diagnosis not present

## 2013-07-14 DIAGNOSIS — J4489 Other specified chronic obstructive pulmonary disease: Secondary | ICD-10-CM | POA: Insufficient documentation

## 2013-07-14 DIAGNOSIS — Z7982 Long term (current) use of aspirin: Secondary | ICD-10-CM | POA: Insufficient documentation

## 2013-07-14 DIAGNOSIS — Z87891 Personal history of nicotine dependence: Secondary | ICD-10-CM | POA: Diagnosis not present

## 2013-07-14 DIAGNOSIS — J449 Chronic obstructive pulmonary disease, unspecified: Secondary | ICD-10-CM | POA: Diagnosis not present

## 2013-07-14 DIAGNOSIS — M064 Inflammatory polyarthropathy: Secondary | ICD-10-CM | POA: Diagnosis not present

## 2013-07-14 DIAGNOSIS — K219 Gastro-esophageal reflux disease without esophagitis: Secondary | ICD-10-CM | POA: Diagnosis not present

## 2013-07-14 DIAGNOSIS — M25519 Pain in unspecified shoulder: Secondary | ICD-10-CM | POA: Diagnosis present

## 2013-07-14 DIAGNOSIS — Z862 Personal history of diseases of the blood and blood-forming organs and certain disorders involving the immune mechanism: Secondary | ICD-10-CM | POA: Diagnosis not present

## 2013-07-14 DIAGNOSIS — I10 Essential (primary) hypertension: Secondary | ICD-10-CM | POA: Diagnosis not present

## 2013-07-14 LAB — CBC WITH DIFFERENTIAL/PLATELET
BASOS ABS: 0 10*3/uL (ref 0.0–0.1)
Basophils Relative: 0 % (ref 0–1)
EOS ABS: 0.2 10*3/uL (ref 0.0–0.7)
Eosinophils Relative: 3 % (ref 0–5)
HCT: 41.7 % (ref 36.0–46.0)
Hemoglobin: 13.4 g/dL (ref 12.0–15.0)
LYMPHS PCT: 29 % (ref 12–46)
Lymphs Abs: 2.2 10*3/uL (ref 0.7–4.0)
MCH: 29.2 pg (ref 26.0–34.0)
MCHC: 32.1 g/dL (ref 30.0–36.0)
MCV: 90.8 fL (ref 78.0–100.0)
MONO ABS: 0.6 10*3/uL (ref 0.1–1.0)
Monocytes Relative: 8 % (ref 3–12)
Neutro Abs: 4.6 10*3/uL (ref 1.7–7.7)
Neutrophils Relative %: 60 % (ref 43–77)
PLATELETS: 300 10*3/uL (ref 150–400)
RBC: 4.59 MIL/uL (ref 3.87–5.11)
RDW: 14.4 % (ref 11.5–15.5)
WBC: 7.6 10*3/uL (ref 4.0–10.5)

## 2013-07-14 LAB — COMPREHENSIVE METABOLIC PANEL
ALBUMIN: 3.6 g/dL (ref 3.5–5.2)
ALT: 43 U/L — AB (ref 0–35)
AST: 36 U/L (ref 0–37)
Alkaline Phosphatase: 128 U/L — ABNORMAL HIGH (ref 39–117)
BUN: 14 mg/dL (ref 6–23)
CALCIUM: 10.2 mg/dL (ref 8.4–10.5)
CO2: 32 meq/L (ref 19–32)
CREATININE: 1.67 mg/dL — AB (ref 0.50–1.10)
Chloride: 100 mEq/L (ref 96–112)
GFR calc Af Amer: 35 mL/min — ABNORMAL LOW (ref >90)
GFR, EST NON AFRICAN AMERICAN: 30 mL/min — AB (ref >90)
Glucose, Bld: 110 mg/dL — ABNORMAL HIGH (ref 70–99)
Potassium: 2.5 mEq/L — CL (ref 3.7–5.3)
Sodium: 145 mEq/L (ref 137–147)
TOTAL PROTEIN: 7.4 g/dL (ref 6.0–8.3)
Total Bilirubin: 0.3 mg/dL (ref 0.3–1.2)

## 2013-07-14 LAB — POTASSIUM: Potassium: 2.8 mEq/L — CL (ref 3.7–5.3)

## 2013-07-14 LAB — TROPONIN I: Troponin I: <0.3 ng/mL (ref <0.30)

## 2013-07-14 LAB — MAGNESIUM: MAGNESIUM: 2.2 mg/dL (ref 1.5–2.5)

## 2013-07-14 LAB — SEDIMENTATION RATE: Sed Rate: 42 mm/hr — ABNORMAL HIGH (ref 0–22)

## 2013-07-14 LAB — LIPASE, BLOOD: LIPASE: 28 U/L (ref 11–59)

## 2013-07-14 MED ORDER — POTASSIUM CHLORIDE CRYS ER 20 MEQ PO TBCR
40.0000 meq | EXTENDED_RELEASE_TABLET | Freq: Once | ORAL | Status: AC
  Administered 2013-07-14: 40 meq via ORAL
  Filled 2013-07-14: qty 2

## 2013-07-14 MED ORDER — POTASSIUM CHLORIDE ER 10 MEQ PO TBCR: 10.0000 meq | EXTENDED_RELEASE_TABLET | Freq: Two times a day (BID) | ORAL | Status: DC

## 2013-07-14 MED ORDER — IBUPROFEN 800 MG PO TABS: 800.0000 mg | ORAL_TABLET | Freq: Three times a day (TID) | ORAL | Status: DC

## 2013-07-14 MED ORDER — PREDNISONE 50 MG PO TABS: ORAL_TABLET | ORAL | Status: DC

## 2013-07-14 MED ORDER — IBUPROFEN 800 MG PO TABS
800.0000 mg | ORAL_TABLET | Freq: Once | ORAL | Status: AC
  Administered 2013-07-14: 800 mg via ORAL
  Filled 2013-07-14: qty 1

## 2013-07-14 MED ORDER — POTASSIUM CHLORIDE 10 MEQ/100ML IV SOLN
10.0000 meq | INTRAVENOUS | Status: AC
  Administered 2013-07-14 (×2): 10 meq via INTRAVENOUS
  Filled 2013-07-14 (×2): qty 100

## 2013-07-14 NOTE — Discharge Instructions (Signed)
Shoulder Immobilizer Followup with Dr. Moshe Cipro for a recheck of your potassium before the end of the week. Followup with Dr. Aline Brochure regarding her shoulder pain. Take medications as prescribed. Return to the ED if you develop new or worsening symptoms. Your doctor has given you a shoulder immobilizer. This can be used to treat shoulder fractures and dislocations. It keeps the arm supported next to the body, and prevents it from swinging loose and from further injury or pain. Shoulder fractures and dislocations usually take 4-6 weeks to heal. HOME CARE INSTRUCTIONS  To reduce irritation in your armpit, use powder or pads to absorb any sweat.  Your immobilizer may be removed and washed as directed, but do not use your arm for any work out of the immobilizer unless your doctor approves.  Always wear your immobilizer at night.  Call your doctor if you have any questions about your injury or how to use this device. Document Released: 04/18/2004 Document Revised: 06/03/2011 Document Reviewed: 02/26/2007 [facility] Patient Information 2014 [place], [place].   Hypokalemia Hypokalemia means that the amount of potassium in the blood is lower than normal.Potassium is a chemical, called an electrolyte, that helps regulate the amount of fluid in the body. It also stimulates muscle contraction and helps nerves function properly.Most of the body's potassium is inside of cells, and only a very small amount is in the blood. Because the amount in the blood is so small, minor changes can be life-threatening. CAUSES  Antibiotics.  Diarrhea or vomiting.  Using laxatives too much, which can cause diarrhea.  Chronic kidney disease.  Water pills (diuretics).  Eating disorders (bulimia).  Low magnesium level.  Sweating a lot. SIGNS AND SYMPTOMS  Weakness.  Constipation.  Fatigue.  Muscle cramps.  Mental confusion.  Skipped heartbeats or irregular heartbeat (palpitations).  Tingling or  numbness. DIAGNOSIS  Your health care provider can diagnose hypokalemia with blood tests. In addition to checking your potassium level, your health care provider may also check other lab tests. TREATMENT Hypokalemia can be treated with potassium supplements taken by mouth or adjustments in your current medicines. If your potassium level is very low, you may need to get potassium through a vein (IV) and be monitored in the hospital. A diet high in potassium is also helpful. Foods high in potassium are:  Nuts, such as peanuts and pistachios.  Seeds, such as sunflower seeds and pumpkin seeds.  Peas, lentils, and lima beans.  Whole grain and bran cereals and breads.  Fresh fruit and vegetables, such as apricots, avocado, bananas, cantaloupe, kiwi, oranges, tomatoes, asparagus, and potatoes.  Orange and tomato juices.  Red meats.  Fruit yogurt. HOME CARE INSTRUCTIONS  Take all medicines as prescribed by your health care provider.  Maintain a healthy diet by including nutritious food, such as fruits, vegetables, nuts, whole grains, and lean meats.  If you are taking a laxative, be sure to follow the directions on the label. SEEK MEDICAL CARE IF:  Your weakness gets worse.  You feel your heart pounding or racing.  You are vomiting or having diarrhea.  You are diabetic and having trouble keeping your blood glucose in the normal range. SEEK IMMEDIATE MEDICAL CARE IF:  You have chest pain, shortness of breath, or dizziness.  You are vomiting or having diarrhea for more than 2 days.  You faint. MAKE SURE YOU:   Understand these instructions.  Will watch your condition.  Will get help right away if you are not doing well or get worse. Document  Released: 03/11/2005 Document Revised: 12/30/2012 Document Reviewed: 09/11/2012 [facility] Patient Information 2014 [place].

## 2013-07-14 NOTE — ED Provider Notes (Signed)
CSN: [id number]     Arrival date & time 07/14/13  1438 History   First MD Initiated Contact with Patient 07/14/13 1458     Chief Complaint  Patient presents with  . Shoulder Pain     (Consider location/radiation/quality/duration/timing/severity/associated sxs/prior Treatment) HPI Comments: Patient presents with progressively worsening left shoulder pain that has been constant since she left the hospital 5 days ago. She states the pain started during her last night in the hospital on April 16. She denies any falls or injuries. She is not taking anything at home for the pain. It only hurts when she moves it. She is unable to range her shoulder as usual. She denies any weakness, numbness or tingling. She denies any previous shoulder injuries. She is not taking anything for the pain. She denies any chest pain, back pain, abdominal pain or vomiting. Her abdominal pain has improved. She is still taking antibiotics for colitis.  The history is provided by the patient.    Past Medical History  Diagnosis Date  . ASCVD (arteriosclerotic cardiovascular disease)     No critical disease on cath in 8/97: mild AI with trival, if any l AS; negative stress nuclear in 2010  . Peripheral vascular disease     stauts post aortabifemoral graft    . Tobacco abuse 2010    Continue at 1/2 pack per day   . Chronic bronchitis   . Hypertension   . Fasting hyperglycemia   . Elevated hemoglobin A1c   . Hydronephrosis   . GERD (gastroesophageal reflux disease)   . Back pain   . Depression   . Osteoporosis   . Secondary erythrocytosis 08/05/2012    Secondary to COPD  . Cervical disc herniation    Past Surgical History  Procedure Laterality Date  . Cholecystectomy    . Aorta bifem graft    . Right kidney surgery following damage during arterial surgery    . Partial hysterectomy    . Total abdominal hysterectomy w/ bilateral salpingoophorectomy  1975  . Lysis of adhesions  2000  . Abdominal hysterectomy      Family History  Problem Relation Age of Onset  . Heart attack Mother   . Heart attack Father   . COPD Sister   . Diabetes Sister   . Diabetes Sister   . Coronary artery disease Sister   . Diabetes Sister   . Heart disease Sister   . COPD Sister    History  Substance Use Topics  . Smoking status: Former Smoker -- 0.50 packs/day    Types: Cigarettes  . Smokeless tobacco: Former Systems developer    Quit date: 05/12/2012  . Alcohol Use: No   OB History   Grav Para Term Preterm Abortions TAB SAB Ect Mult Living                 Review of Systems  Constitutional: Negative for fever, activity change and appetite change.  HENT: Negative for congestion, rhinorrhea and tinnitus.   Respiratory: Negative for cough, chest tightness and shortness of breath.   Cardiovascular: Negative for chest pain.  Gastrointestinal: Negative for nausea, vomiting and abdominal pain.  Genitourinary: Negative for dysuria and hematuria.  Musculoskeletal: Positive for arthralgias and myalgias. Negative for back pain, neck pain and neck stiffness.  Neurological: Negative for dizziness, weakness and headaches.  A complete 10 system review of systems was obtained and all systems are negative except as noted in the HPI and PMH.      Allergies  Review of patient's allergies indicates no known allergies.  Home Medications   Prior to Admission medications   Medication Sig Start Date End Date Taking? Authorizing Provider  alendronate (FOSAMAX) 70 MG tablet Take 70 mg by mouth every Sunday. Take with a full glass of water on an empty stomach.    Historical Provider, MD  amLODipine (NORVASC) 5 MG tablet Take 5 mg by mouth daily. 08/11/12   Fayrene Helper, MD  aspirin EC 81 MG tablet Take 81 mg by mouth daily.    Historical Provider, MD  CALCIUM PO Take 1 tablet by mouth daily.    Historical Provider, MD  Cholecalciferol (VITAMIN D) 2000 UNITS CAPS Take 1 capsule by mouth daily.    Historical Provider, MD  docusate  sodium (CORRECTOL EXTRA GENTLE) 100 MG capsule Take 300 mg by mouth daily as needed for mild constipation or moderate constipation.    Historical Provider, MD  HYDROcodone-acetaminophen (NORCO) 10-325 MG per tablet Take 1 tablet by mouth 3 (three) times daily.    Historical Provider, MD  levofloxacin (LEVAQUIN) 250 MG tablet Take 1 tablet (250 mg total) by mouth daily. X 10days 07/09/13   Ripudeep Krystal Eaton, MD  metFORMIN (GLUCOPHAGE-XR) 500 MG 24 hr tablet Take 500 mg by mouth daily with breakfast.    Historical Provider, MD  metroNIDAZOLE (FLAGYL) 500 MG tablet Take 1 tablet (500 mg total) by mouth 3 (three) times daily. X 10days 07/09/13   Ripudeep Krystal Eaton, MD  Multiple Vitamin (MULTIVITAMIN WITH MINERALS) TABS Take 1 tablet by mouth every evening. MULTIVITAMIN-OTC to promote LOWER CHOLESTEROL    Historical Provider, MD  Omega-3 Fatty Acids (FISH OIL PO) Take 1 capsule by mouth daily.    Historical Provider, MD  promethazine (PHENERGAN) 12.5 MG tablet Take 1 tablet (12.5 mg total) by mouth every 6 (six) hours as needed for nausea or vomiting. 07/09/13   Ripudeep Krystal Eaton, MD  rosuvastatin (CRESTOR) 40 MG tablet Take 40 mg by mouth at bedtime.    Historical Provider, MD   BP 167/72  Pulse 90  Temp(Src) 97.6 F (36.4 C) (Oral)  Resp 16  SpO2 97% Physical Exam  Constitutional: She is oriented to person, place, and time. She appears well-developed and well-nourished. No distress.  HENT:  Head: Normocephalic and atraumatic.  Mouth/Throat: Oropharynx is clear and moist. No oropharyngeal exudate.  Eyes: Conjunctivae and EOM are normal. Pupils are equal, round, and reactive to light. Right eye exhibits no discharge. Left eye exhibits no discharge.  Neck: Normal range of motion. Neck supple.  No C spine tenderness. No paraspinal or trapezius tenderness  Cardiovascular: Normal rate, regular rhythm and normal heart sounds.   No murmur heard. Pulmonary/Chest: Effort normal and breath sounds normal. No  respiratory distress.  Abdominal: Soft. There is no tenderness. There is no rebound and no guarding.  Musculoskeletal: She exhibits tenderness. She exhibits no edema.  Tenderness to palpation to left anterior glenohumeral joint line and posterior scapula. Pain with range of motion. Able to abduct to horizontal. Intact radial pulse, equal grip strengths. No warmth or erythema.   Neurological: She is alert and oriented to person, place, and time. No cranial nerve deficit. She exhibits normal muscle tone. Coordination normal.  CN 2-12 intact, no ataxia on finger to nose, no nystagmus, 5/5 strength throughout, no pronator drift, Romberg negative, normal gait.   Skin: Skin is warm.    ED Course  Procedures (including critical care time) Labs Review Labs Reviewed  COMPREHENSIVE METABOLIC PANEL -  Abnormal; Notable for the following:    Potassium 2.5 (*)    Glucose, Bld 110 (*)    Creatinine, Ser 1.67 (*)    ALT 43 (*)    Alkaline Phosphatase 128 (*)    GFR calc non Af Amer 30 (*)    GFR calc Af Amer 35 (*)    All other components within normal limits  POTASSIUM - Abnormal; Notable for the following:    Potassium 2.8 (*)    All other components within normal limits  SEDIMENTATION RATE - Abnormal; Notable for the following:    Sed Rate 42 (*)    All other components within normal limits  CBC WITH DIFFERENTIAL  TROPONIN I  LIPASE, BLOOD  MAGNESIUM  TROPONIN I  C-REACTIVE PROTEIN    Imaging Review Dg Cervical Spine Complete  07/14/2013   CLINICAL DATA:  Left shoulder pain.  Neck pain.  EXAM: CERVICAL SPINE  4+ VIEWS  COMPARISON:  MRI cervical spine 11/09/2012.  FINDINGS: Cervical spine is visualized and skullbase through C7. The cervicothoracic junction is visualized on the swimmer's view an is grossly intact. Bone detail is limited.  Vertebral body heights and alignment are maintained. The prevertebral soft tissues are within normal limits. Endplate changes are again seen at C5-6.  Bilateral uncovertebral and foraminal narrowing is present at C5-6 is well. This is compatible with the patient's known large disc osteophyte complex and uncovertebral spurring.  The lung apices are clear.  IMPRESSION: 1. Moderate spondylosis is again seen at C5-6. 2. No acute abnormality.   Electronically Signed   By: Gennette Pac M.D.   On: 07/14/2013 15:57   Mr Cervical Spine Wo Contrast  07/14/2013   CLINICAL DATA:  Left neck pain.  Left shoulder pain  EXAM: MRI CERVICAL SPINE WITHOUT CONTRAST  TECHNIQUE: Multiplanar, multisequence MR imaging was performed. No intravenous contrast was administered.  COMPARISON:  Cervical MRI 11/09/2012  FINDINGS: Normal cervical alignment. Negative for fracture or mass lesion. Spinal cord signal is normal. Craniocervical junction is normal.  C2-3:  Negative  C3-4: Mild disc bulging and mild narrowing of the spinal canal. Facet hypertrophy bilaterally.  C4-5: Disc degeneration with mild spondylosis. Bilateral facet hypertrophy and mild spinal stenosis.  C5-6: Marked improvement in the large central disc protrusion seen previously. There is disc degeneration and spondylosis. There is flattening of the cord with moderate spinal stenosis. Neural foraminal encroachment bilaterally due to spurring.  C6-7:  Negative  C7-T1:  Negative  IMPRESSION: Mild spinal stenosis at C3-4 and C4-5 secondary to spondylosis  At C5-6, there is marked improvement in the large central and right-sided disc protrusion seen on 11/09/2012. There is moderate spondylosis and moderate spinal stenosis at this level. No cord signal abnormality.   Electronically Signed   By: Marlan Palau M.D.   On: 07/14/2013 17:55   Mr Shoulder Left Wo Contrast  07/14/2013   CLINICAL DATA:  Left-sided neck pain. Left shoulder pain. Onset 07/07/2013.  EXAM: MRI OF THE LEFT SHOULDER WITHOUT CONTRAST  TECHNIQUE: Multiplanar, multisequence MR imaging of the shoulder was performed. No intravenous contrast was administered.   COMPARISON:  DG SHOULDER*L* dated 07/14/2013  FINDINGS: Rotator cuff: Supraspinatus tendinopathy is present without tear extending to the surface of the tendon. Tiny intrasubstance tear is present at the anterior insertion, best seen on the coronal images. Infraspinatus tendon appears intact. Teres minor tendon appears normal. Subscapularis tendon normal.  Muscles: There is edema radiating from the glenohumeral joint along the subscapularis and to a lesser  extent along supraspinatus and infraspinatus. This is favored to be reactive to inflammation within the joint. No fatty atrophy of the rotator cuff musculature.  Biceps long head: The shoulder is internally rotated at the time of imaging. Biceps long head tendon appears intact with tendinopathy in the rotator interval. Biceps long head tenosynovitis.  Acromioclavicular Joint: Type 2 acromion. AC joint appears normal. Subacromial bursitis is present.  Glenohumeral Joint: Glenohumeral effusion is present with debris/ synovitis in the axillary pouch anteriorly. Heterogeneous signal mass is present in the axillary pouch measuring 2 cm AP x 1 cm transverse. This is difficult to fully visualize the other imaging planes.  Labrum:  Grossly intact.  Bones: Bone marrow edema is present in the anterior glenoid at the 3 o'clock position. Although the bone marrow is subchondral and along the anterior margin of the labrum, the cause is not clear.  IMPRESSION: 1. Diffuse inflammatory changes of the glenohumeral joint with radiating edema from the joint extending into the rotator cuff musculature. Amorphous mass in the axillary pouch may represent clot or synovitis. There is no given history of trauma. In the absence of trauma, these findings are often associated with inflammatory arthritis such as rheumatoid. Laboratory screening for rheumatoid arthritis is recommended. Infection is unlikely. 2. Subacromial bursitis and biceps long head tenosynovitis. 3. Rotator cuff tendinopathy  with tiny intrasubstance tear at the anterior insertion of supraspinatus.   Electronically Signed   By: Dereck Ligas M.D.   On: 07/14/2013 20:21   Dg Shoulder Left  07/14/2013   CLINICAL DATA:  Left shoulder pain from the neck down. No known injury.  EXAM: LEFT SHOULDER - 2+ VIEW  COMPARISON:  None.  FINDINGS: There is no evidence of fracture or dislocation. There is no evidence of arthropathy or other focal bone abnormality. Soft tissues are unremarkable.  IMPRESSION: Negative.   Electronically Signed   By: Kathreen Devoid   On: 07/14/2013 15:55     EKG Interpretation   Date/Time:  Wednesday July 14 2013 15:51:22 EDT Ventricular Rate:  77 PR Interval:  194 QRS Duration: 76 QT Interval:  356 QTC Calculation: 402 R Axis:   66 Text Interpretation:  Normal sinus rhythm Anteroseptal infarct (cited on  or before 15-Sep-2012) Abnormal ECG When compared with ECG of 07-Jul-2013  18:25, Nonspecific T wave abnormality no longer evident in Inferior leads  No significant change was found Confirmed by Wyvonnia Dusky  MD, Marilou Barnfield [phone])  on 07/14/2013 3:59:33 PM      MDM   Final diagnoses:  Inflammatory arthritis  Hypokalemia   6 day history of left shoulder pain, worse with movement. Decreased range of motion. No weakness or paresthesias. No edema. Patient is able to range the joint. Low suspicion for septic joint. No fever. No trauma.  X-ray shows spondylosis at C5-C6. Suspect patient shoulder pain may be secondary to cervical radiculopathy.  Creatinine continues to improve on blood work. Hypokalemia 2.5 noted. EKG unchanged.  Troponin negative x 2.  MRI of C-spine shows spinal stenosis with improvement in disc bulging. No cord abnormality.  \ Potassium improved to 2.8 on recheck. Inflammatory changes on shoulder MRI results discussed with Dr. Aline Brochure. He agrees low suspicion for septic joint. We'll place an shoulder immobilizer, provide anti-inflammatories and steroids. Followup this  week.  Medication list reviewed. No diuretics on her list. We'll give short course of potassium by she needs potassium recheck this week. Patient wishes to go home and doesn't want to be admitted. Agrees for follow up for  recheck of potassium with Dr. Moshe Cipro.   BP 167/72  Pulse 90  Temp(Src) 97.6 F (36.4 C) (Oral)  Resp 16  SpO2 97%   Ezequiel Essex, MD 07/15/13 1330

## 2013-07-14 NOTE — ED Notes (Signed)
CRITICAL VALUE ALERT  Critical value received:  Potassium - 2.8  Date of notification:  07/14/2013  Time of notification:  2109  Critical value read back: yes  Nurse who received alert:  Barbaraann Faster RN  MD notified (1st page):  Dr Wyvonnia Dusky  Time of first page:  2109  MD notified (2nd page):  Time of second page:  Responding MD:  Dr Wyvonnia Dusky  Time MD responded: 2110

## 2013-07-14 NOTE — ED Notes (Signed)
CRITICAL VALUE ALERT  Critical value received:  Potassium 2.5  Date of notification:  07/14/13  Time of notification:  1650  Critical value read back:yes  Nurse who received alert:  c Chanel Mckesson rn  MD notified (1st page):    Time of first page:    MD notified (2nd page):  Time of second page:  Responding MD:  Dr Wyvonnia Dusky  Time MD responded:  1050

## 2013-07-14 NOTE — ED Notes (Signed)
Pt c/o left shoulder pain x1 week. Pt denies injury. Pt states "it all started last week when they admitted me for stomach problems".

## 2013-07-14 NOTE — ED Notes (Signed)
Pt still in MRI 

## 2013-07-15 LAB — C-REACTIVE PROTEIN: CRP: 1.4 mg/dL — ABNORMAL HIGH (ref <0.60)

## 2013-07-19 ENCOUNTER — Telehealth: Payer: Self-pay

## 2013-07-19 ENCOUNTER — Encounter: Payer: Self-pay | Admitting: Family Medicine

## 2013-07-19 ENCOUNTER — Encounter (INDEPENDENT_AMBULATORY_CARE_PROVIDER_SITE_OTHER): Payer: Self-pay

## 2013-07-19 ENCOUNTER — Ambulatory Visit (INDEPENDENT_AMBULATORY_CARE_PROVIDER_SITE_OTHER): Payer: Medicare Other | Admitting: Family Medicine

## 2013-07-19 VITALS — BP 134/78 | HR 76 | Resp 18 | Ht 61.75 in | Wt 136.1 lb

## 2013-07-19 DIAGNOSIS — M25519 Pain in unspecified shoulder: Secondary | ICD-10-CM

## 2013-07-19 DIAGNOSIS — E1151 Type 2 diabetes mellitus with diabetic peripheral angiopathy without gangrene: Secondary | ICD-10-CM

## 2013-07-19 DIAGNOSIS — E1159 Type 2 diabetes mellitus with other circulatory complications: Secondary | ICD-10-CM

## 2013-07-19 DIAGNOSIS — M25512 Pain in left shoulder: Secondary | ICD-10-CM

## 2013-07-19 DIAGNOSIS — M5090 Cervical disc disorder, unspecified, unspecified cervical region: Secondary | ICD-10-CM

## 2013-07-19 DIAGNOSIS — I798 Other disorders of arteries, arterioles and capillaries in diseases classified elsewhere: Secondary | ICD-10-CM

## 2013-07-19 DIAGNOSIS — F329 Major depressive disorder, single episode, unspecified: Secondary | ICD-10-CM

## 2013-07-19 DIAGNOSIS — K5909 Other constipation: Secondary | ICD-10-CM

## 2013-07-19 DIAGNOSIS — F341 Dysthymic disorder: Secondary | ICD-10-CM

## 2013-07-19 DIAGNOSIS — N179 Acute kidney failure, unspecified: Secondary | ICD-10-CM

## 2013-07-19 DIAGNOSIS — E876 Hypokalemia: Secondary | ICD-10-CM

## 2013-07-19 DIAGNOSIS — F419 Anxiety disorder, unspecified: Secondary | ICD-10-CM

## 2013-07-19 DIAGNOSIS — K5900 Constipation, unspecified: Secondary | ICD-10-CM | POA: Insufficient documentation

## 2013-07-19 DIAGNOSIS — E785 Hyperlipidemia, unspecified: Secondary | ICD-10-CM

## 2013-07-19 DIAGNOSIS — I10 Essential (primary) hypertension: Secondary | ICD-10-CM

## 2013-07-19 MED ORDER — LINACLOTIDE 145 MCG PO CAPS: 145.0000 ug | ORAL_CAPSULE | Freq: Every day | ORAL | Status: DC

## 2013-07-19 NOTE — Telephone Encounter (Signed)
Called and left message for patient to return call for medication review.

## 2013-07-19 NOTE — Telephone Encounter (Addendum)
No prednisone, will know about the potassium tomorrow after she has labs done, since she no longer has vomiting or diareah or abdominal pain, no fever or chills no levaquin at this time. Let her know I am sending in the linzesss (NEW) discussed at visit for chronic constipation,oif not successful she will need to see GI specialist for this

## 2013-07-19 NOTE — Patient Instructions (Addendum)
Pelvic and breast exam in 6 weeks, call if you need me before  You are referred to orthopedic Doc in [place], Mardelle Matte re left shoulder pain due to bursitis and tendinopathy  Labs today and we will call about potassium and the medication you take for diabetes, metformin, CMP and EGFR  No metformin until we let you know that it is safe to resume this.  No ibuprofen due to spiling protein excess protein in your urine  You will need one new medication as a diabetic to help to protect your kidneys, this will be started after I  Review your labs

## 2013-07-20 ENCOUNTER — Telehealth: Payer: Self-pay | Admitting: Family Medicine

## 2013-07-20 LAB — COMPLETE METABOLIC PANEL WITH GFR
ALT: 22 U/L (ref 0–35)
AST: 25 U/L (ref 0–37)
Albumin: 4.3 g/dL (ref 3.5–5.2)
Alkaline Phosphatase: 98 U/L (ref 39–117)
BUN: 14 mg/dL (ref 6–23)
CALCIUM: 10.2 mg/dL (ref 8.4–10.5)
CHLORIDE: 100 meq/L (ref 96–112)
CO2: 31 mEq/L (ref 19–32)
Creat: 1.27 mg/dL — ABNORMAL HIGH (ref 0.50–1.10)
GFR, EST AFRICAN AMERICAN: 50 mL/min — AB
GFR, Est Non African American: 43 mL/min — ABNORMAL LOW
Glucose, Bld: 123 mg/dL — ABNORMAL HIGH (ref 70–99)
Potassium: 3.4 mEq/L — ABNORMAL LOW (ref 3.5–5.3)
Sodium: 144 mEq/L (ref 135–145)
Total Bilirubin: 0.4 mg/dL (ref 0.2–1.2)
Total Protein: 7 g/dL (ref 6.0–8.3)

## 2013-07-20 NOTE — Telephone Encounter (Signed)
Called left message again please call back

## 2013-07-20 NOTE — Telephone Encounter (Signed)
Called and left message for patient to return call.  

## 2013-07-21 ENCOUNTER — Other Ambulatory Visit: Payer: Self-pay | Admitting: Family Medicine

## 2013-07-21 MED ORDER — LUBIPROSTONE 24 MCG PO CAPS: 24.0000 ug | ORAL_CAPSULE | Freq: Every day | ORAL | Status: DC

## 2013-07-21 MED ORDER — POTASSIUM CHLORIDE ER 10 MEQ PO TBCR: 10.0000 meq | EXTENDED_RELEASE_TABLET | Freq: Every day | ORAL | Status: DC

## 2013-07-21 NOTE — Telephone Encounter (Signed)
Multiple attempts made to reach patient by both nurses.  Messages left on home and cell phone numbers listed.   No further phone attempts to be made.  Asked that patient stop by office to receive an update on medications.   Will also send correspondence by mail.

## 2013-07-21 NOTE — Telephone Encounter (Signed)
Multiple attempts by both nurses made to reach patient.  Last message left was for patient to come by office in person to receive an update on medications.

## 2013-07-23 NOTE — Telephone Encounter (Signed)
Spoke with patient and husband

## 2013-07-26 ENCOUNTER — Other Ambulatory Visit: Payer: Self-pay

## 2013-07-26 DIAGNOSIS — M5090 Cervical disc disorder, unspecified, unspecified cervical region: Secondary | ICD-10-CM

## 2013-07-26 MED ORDER — HYDROCODONE-ACETAMINOPHEN 10-325 MG PO TABS: ORAL_TABLET | ORAL | Status: DC

## 2013-07-26 MED ORDER — ALPRAZOLAM 0.5 MG PO TABS: ORAL_TABLET | ORAL | Status: DC

## 2013-07-29 ENCOUNTER — Ambulatory Visit: Payer: Medicare Other | Admitting: Family Medicine

## 2013-08-05 ENCOUNTER — Telehealth: Payer: Self-pay

## 2013-08-05 NOTE — Telephone Encounter (Signed)
Needs to see neurosurgeon re spinal problems causing shoulder pain  Or ortho re shoulder pain I will refer tio ortho of her choice for eval of shoulder ,since she has genuine pain from her neck problems, until she decides to have the surgery I strongly recommend she sees pain specialist for pain managemnt, Dr Lyla Son or Merlene Laughter are local, offer referral and I will sign.

## 2013-08-06 NOTE — Telephone Encounter (Signed)
Patient would like to hold on any referrals at the time.

## 2013-08-07 LAB — BASIC METABOLIC PANEL WITH GFR
BUN: 11 mg/dL (ref 6–23)
CALCIUM: 10.8 mg/dL — AB (ref 8.4–10.5)
CHLORIDE: 104 meq/L (ref 96–112)
CO2: 24 meq/L (ref 19–32)
Creat: 1.05 mg/dL (ref 0.50–1.10)
GFR, Est African American: 63 mL/min
GFR, Est Non African American: 55 mL/min — ABNORMAL LOW
Glucose, Bld: 107 mg/dL — ABNORMAL HIGH (ref 70–99)
POTASSIUM: 4.4 meq/L (ref 3.5–5.3)
SODIUM: 140 meq/L (ref 135–145)

## 2013-08-12 ENCOUNTER — Ambulatory Visit (INDEPENDENT_AMBULATORY_CARE_PROVIDER_SITE_OTHER): Payer: Medicare Other | Admitting: Gastroenterology

## 2013-08-12 ENCOUNTER — Encounter: Payer: Self-pay | Admitting: Gastroenterology

## 2013-08-12 ENCOUNTER — Encounter (INDEPENDENT_AMBULATORY_CARE_PROVIDER_SITE_OTHER): Payer: Self-pay

## 2013-08-12 VITALS — BP 146/77 | HR 97 | Temp 98.2°F | Ht 62.0 in | Wt 130.6 lb

## 2013-08-12 DIAGNOSIS — K625 Hemorrhage of anus and rectum: Secondary | ICD-10-CM | POA: Insufficient documentation

## 2013-08-12 DIAGNOSIS — K5900 Constipation, unspecified: Secondary | ICD-10-CM

## 2013-08-12 MED ORDER — LINACLOTIDE 290 MCG PO CAPS: 290.0000 ug | ORAL_CAPSULE | Freq: Every day | ORAL | Status: DC

## 2013-08-12 NOTE — Assessment & Plan Note (Addendum)
68 year old female with recent admission in April 2015 for acute onset of abdominal pain, rectal bleeding, and CT findings of left-sided colitis. Question ischemic colitis; improved with supportive measures and has completed empiric course of abx. No further diarrhea, rectal bleeding, or significant abdominal pain. Dealing more with constipation currently. Last colonoscopy in 2008 with poor prep and a remote hx of colonic polyps; due for surveillance now.  Proceed with colonoscopy with Dr. Oneida Alar in the near future. The risks, benefits, and alternatives have been discussed in detail with the patient. They state understanding and desire to proceed.  2 day Miralax prep per last recommendations.

## 2013-08-12 NOTE — Patient Instructions (Signed)
For constipation: Just take Linzess 1 capsule each morning, 30 minutes before breakfast. I have provided a voucher for you.  We have set you up for a colonoscopy with Dr. Oneida Alar in the near future.

## 2013-08-12 NOTE — Assessment & Plan Note (Signed)
Start Linzess 290 mcg daily. Amitiza has been trialed without much success.

## 2013-08-12 NOTE — Progress Notes (Signed)
Primary Care Physician:  Tula Nakayama, MD Primary Gastroenterologist:  Dr. Oneida Alar  Chief Complaint  Patient presents with  . Rectal Bleeding    not now  . Constipation    HPI:   Carla [name] presents today in follow-up after recent hospital admission with N/V/D and rectal bleeding. She improved with symptomatic treatment and empiric abx. No stool studies obtained, as diarrhea had improved at admission. Mucus, bloody stools prior to admission April 2015. Resolved now. CT at that time showed left-sided colonic wall thickening likely due to infectious/inflammatory process, possible ischemic colitis.   Feels constipated all the time. Notes lower abdominal discomfort, associated with constipation. Taking Correctol, Amitiza 24 mcg once daily recently started but without good results. No significant BM in a week. Quit smoking 1.5 years ago. No significant weight changes. Appetite waxes and wanes. Hx of chronic constipation. No FH of colon cancer. No further rectal bleeding since hospitalized. Last colonoscopy in 2008 without polyps but poor prep. Hx of polyps in remote past, with need for surveillance now per plan.   Past Medical History  Diagnosis Date  . ASCVD (arteriosclerotic cardiovascular disease)     No critical disease on cath in 8/97: mild AI with trival, if any l AS; negative stress nuclear in 2010  . Peripheral vascular disease     stauts post aortabifemoral graft    . Tobacco abuse     has stopped  . Chronic bronchitis   . Hypertension   . Fasting hyperglycemia   . Elevated hemoglobin A1c   . Hydronephrosis   . GERD (gastroesophageal reflux disease)   . Back pain   . Depression   . Osteoporosis   . Secondary erythrocytosis 08/05/2012    Secondary to COPD  . Cervical disc herniation   . Colon polyps     Past Surgical History  Procedure Laterality Date  . Cholecystectomy    . Aorta bifem graft    . Right kidney surgery following damage during arterial  surgery    . Partial hysterectomy    . Total abdominal hysterectomy w/ bilateral salpingoophorectomy  1975  . Lysis of adhesions  2000  . Abdominal hysterectomy    . Colonoscopy  06/23/2006    Dr. Fields:Normal colon without evidence of polyps, masses, inflammatory changes/Normal retroflex view of the rectum    Current Outpatient Prescriptions  Medication Sig Dispense Refill  . ALPRAZolam (XANAX) 0.5 MG tablet TAKE ONE TABLET BY MOUTH TWICE DAILY.  60 tablet  3  . amLODipine (NORVASC) 5 MG tablet Take 5 mg by mouth daily.      Marland Kitchen aspirin EC 81 MG tablet Take 81 mg by mouth daily.      Marland Kitchen CALCIUM PO Take 1 tablet by mouth daily.      . Cholecalciferol (VITAMIN D) 2000 UNITS CAPS Take 1 capsule by mouth daily.      Marland Kitchen docusate sodium (CORRECTOL EXTRA GENTLE) 100 MG capsule Take 300 mg by mouth daily as needed for mild constipation or moderate constipation.      Marland Kitchen HYDROcodone-acetaminophen (NORCO) 10-325 MG per tablet Take 1 tablet by mouth 3 (three) times daily.      Marland Kitchen HYDROcodone-acetaminophen (NORCO) 10-325 MG per tablet One three times daily  90 tablet  0  . lubiprostone (AMITIZA) 24 MCG capsule Take 1 capsule (24 mcg total) by mouth daily with breakfast.  30 capsule  4  . Multiple Vitamin (MULTIVITAMIN WITH MINERALS) TABS Take 1 tablet by mouth  every evening.       . Omega-3 Fatty Acids (FISH OIL PO) Take 1 capsule by mouth daily.      . potassium chloride (K-DUR) 10 MEQ tablet Take 1 tablet (10 mEq total) by mouth 2 (two) times daily.  10 tablet  0  . potassium chloride (KLOR-CON 10) 10 MEQ tablet Take 1 tablet (10 mEq total) by mouth daily.  30 tablet  3  . promethazine (PHENERGAN) 12.5 MG tablet Take 1 tablet (12.5 mg total) by mouth every 6 (six) hours as needed for nausea or vomiting.  30 tablet  0  . rosuvastatin (CRESTOR) 40 MG tablet Take 40 mg by mouth at bedtime.      . Linaclotide (LINZESS) 290 MCG CAPS capsule Take 1 capsule (290 mcg total) by mouth daily.  30 capsule  5   No  current facility-administered medications for this visit.    Allergies as of 08/12/2013  . (No Known Allergies)    Family History  Problem Relation Age of Onset  . Heart attack Mother   . Heart attack Father   . COPD Sister   . Diabetes Sister   . Diabetes Sister   . Coronary artery disease Sister   . Diabetes Sister   . Heart disease Sister   . COPD Sister   . Colon cancer Neg Hx     History   Social History  . Marital Status: Married    Spouse Name: N/A    Number of Children: 3  . Years of Education: N/A   Occupational History  . disabled     Social History Main Topics  . Smoking status: Former Smoker -- 0.50 packs/day    Types: Cigarettes  . Smokeless tobacco: Former Systems developer    Quit date: 05/12/2012  . Alcohol Use: No  . Drug Use: No  . Sexual Activity: No   Other Topics Concern  . Not on file   Social History Narrative   2 Children living 1 deceased     Review of Systems: Gen: see HPI CV: Denies chest pain, heart palpitations, peripheral edema, syncope.  Resp: cough, allergies GI: see HPI GU : Denies urinary burning, urinary frequency, urinary hesitancy MS: shoulder pain Derm: Denies rash, itching, dry skin Psych: Denies depression, anxiety, memory loss, and confusion Heme: Denies bruising, bleeding, and enlarged lymph nodes.  Physical Exam: BP 146/77  Pulse 97  Temp(Src) 98.2 F (36.8 C) (Oral)  Ht 5\' 2"  (1.575 m)  Wt 130 lb 9.6 oz (59.24 kg)  BMI 23.88 kg/m2 General:   Alert and oriented. Pleasant and cooperative. Well-nourished and well-developed.  Head:  Normocephalic and atraumatic. Eyes:  Without icterus, sclera clear and conjunctiva pink.  Ears:  Normal auditory acuity. Nose:  No deformity, discharge,  or lesions. Mouth:  No deformity or lesions, oral mucosa pink.  Lungs:  Clear to auscultation bilaterally. No wheezes, rales, or rhonchi. No distress.  Heart:  S1, S2 present without murmurs appreciated.  Abdomen:  +BS, soft, non-tender  and non-distended. No HSM noted. Small ventral hernia easily reducible.  Rectal:  Deferred  Msk:  Symmetrical without gross deformities. Normal posture. Extremities:  Without clubbing or edema. Neurologic:  Alert and  oriented x4;  grossly normal neurologically. Skin:  Intact without significant lesions or rashes. Psych:  Alert and cooperative. Normal mood and affect.   Lab Results  Component Value Date   WBC 7.6 07/14/2013   HGB 13.4 07/14/2013   HCT 41.7 07/14/2013   MCV 90.8  07/14/2013   PLT 300 07/14/2013   Lab Results  Component Value Date   ALT 22 07/19/2013   AST 25 07/19/2013   ALKPHOS 98 07/19/2013   BILITOT 0.4 07/19/2013

## 2013-08-17 ENCOUNTER — Telehealth: Payer: Self-pay | Admitting: Family Medicine

## 2013-08-17 NOTE — Telephone Encounter (Signed)
appt scheduled for tomorrow

## 2013-08-18 ENCOUNTER — Encounter (INDEPENDENT_AMBULATORY_CARE_PROVIDER_SITE_OTHER): Payer: Self-pay

## 2013-08-18 ENCOUNTER — Ambulatory Visit (INDEPENDENT_AMBULATORY_CARE_PROVIDER_SITE_OTHER): Payer: Medicare Other | Admitting: Family Medicine

## 2013-08-18 ENCOUNTER — Encounter: Payer: Self-pay | Admitting: Family Medicine

## 2013-08-18 VITALS — BP 140/78 | HR 88 | Temp 98.6°F | Resp 18 | Ht 61.75 in | Wt 130.0 lb

## 2013-08-18 DIAGNOSIS — I10 Essential (primary) hypertension: Secondary | ICD-10-CM

## 2013-08-18 DIAGNOSIS — E559 Vitamin D deficiency, unspecified: Secondary | ICD-10-CM

## 2013-08-18 DIAGNOSIS — E785 Hyperlipidemia, unspecified: Secondary | ICD-10-CM

## 2013-08-18 DIAGNOSIS — E210 Primary hyperparathyroidism: Secondary | ICD-10-CM

## 2013-08-18 DIAGNOSIS — B351 Tinea unguium: Secondary | ICD-10-CM

## 2013-08-18 DIAGNOSIS — J209 Acute bronchitis, unspecified: Secondary | ICD-10-CM | POA: Insufficient documentation

## 2013-08-18 DIAGNOSIS — M5090 Cervical disc disorder, unspecified, unspecified cervical region: Secondary | ICD-10-CM

## 2013-08-18 DIAGNOSIS — R7309 Other abnormal glucose: Secondary | ICD-10-CM

## 2013-08-18 DIAGNOSIS — F411 Generalized anxiety disorder: Secondary | ICD-10-CM

## 2013-08-18 MED ORDER — TERBINAFINE HCL 250 MG PO TABS: 250.0000 mg | ORAL_TABLET | Freq: Every day | ORAL | Status: DC

## 2013-08-18 MED ORDER — AZITHROMYCIN 250 MG PO TABS: ORAL_TABLET | ORAL | Status: DC

## 2013-08-18 MED ORDER — BENAZEPRIL HCL 10 MG PO TABS: 10.0000 mg | ORAL_TABLET | Freq: Every day | ORAL | Status: DC

## 2013-08-18 NOTE — Patient Instructions (Signed)
F/u Jul;y 18 or after  Zpack for chest congestion, new additional pill for blood pressure to help kidneys, lotensin, and also new additional pill for fungal toenails, terbinafine  Fasting labs July 16 or after but at least 2 days before f/u   Your diabetes is managed by diet alone at this time

## 2013-08-18 NOTE — Progress Notes (Signed)
cc'd to pcp 

## 2013-08-19 ENCOUNTER — Other Ambulatory Visit: Payer: Self-pay

## 2013-08-19 DIAGNOSIS — M5090 Cervical disc disorder, unspecified, unspecified cervical region: Secondary | ICD-10-CM

## 2013-08-19 MED ORDER — HYDROCODONE-ACETAMINOPHEN 10-325 MG PO TABS: ORAL_TABLET | ORAL | Status: DC

## 2013-08-23 ENCOUNTER — Telehealth: Payer: Self-pay

## 2013-08-23 ENCOUNTER — Telehealth: Payer: Self-pay | Admitting: Family Medicine

## 2013-08-23 NOTE — Telephone Encounter (Signed)
Pt is calling because her left shoulder is hurting(roate cuff) and she is afraid that they will hurt her. I told her that I would make a note and call the hospital to inform them. Pamala Hurry is aware at the hospital.

## 2013-08-24 ENCOUNTER — Ambulatory Visit (HOSPITAL_COMMUNITY)
Admission: RE | Admit: 2013-08-24 | Discharge: 2013-08-24 | Disposition: A | Discharge status: Home / Self Care | Payer: Medicare Other | Source: Ambulatory Visit | Attending: Gastroenterology | Admitting: Gastroenterology

## 2013-08-24 ENCOUNTER — Encounter (HOSPITAL_COMMUNITY)
Admission: RE | Disposition: A | Discharge status: Home / Self Care | Payer: Self-pay | Source: Ambulatory Visit | Attending: Gastroenterology

## 2013-08-24 ENCOUNTER — Encounter (HOSPITAL_COMMUNITY): Payer: Self-pay | Admitting: *Deleted

## 2013-08-24 DIAGNOSIS — Z7982 Long term (current) use of aspirin: Secondary | ICD-10-CM | POA: Diagnosis not present

## 2013-08-24 DIAGNOSIS — K219 Gastro-esophageal reflux disease without esophagitis: Secondary | ICD-10-CM | POA: Insufficient documentation

## 2013-08-24 DIAGNOSIS — F3289 Other specified depressive episodes: Secondary | ICD-10-CM | POA: Insufficient documentation

## 2013-08-24 DIAGNOSIS — M810 Age-related osteoporosis without current pathological fracture: Secondary | ICD-10-CM | POA: Diagnosis not present

## 2013-08-24 DIAGNOSIS — K620 Anal polyp: Secondary | ICD-10-CM | POA: Insufficient documentation

## 2013-08-24 DIAGNOSIS — K5730 Diverticulosis of large intestine without perforation or abscess without bleeding: Secondary | ICD-10-CM | POA: Insufficient documentation

## 2013-08-24 DIAGNOSIS — K625 Hemorrhage of anus and rectum: Secondary | ICD-10-CM | POA: Insufficient documentation

## 2013-08-24 DIAGNOSIS — I2510 Atherosclerotic heart disease of native coronary artery without angina pectoris: Secondary | ICD-10-CM | POA: Insufficient documentation

## 2013-08-24 DIAGNOSIS — K6389 Other specified diseases of intestine: Secondary | ICD-10-CM | POA: Insufficient documentation

## 2013-08-24 DIAGNOSIS — K5289 Other specified noninfective gastroenteritis and colitis: Secondary | ICD-10-CM | POA: Diagnosis not present

## 2013-08-24 DIAGNOSIS — K5900 Constipation, unspecified: Secondary | ICD-10-CM | POA: Insufficient documentation

## 2013-08-24 DIAGNOSIS — F329 Major depressive disorder, single episode, unspecified: Secondary | ICD-10-CM | POA: Diagnosis not present

## 2013-08-24 DIAGNOSIS — D126 Benign neoplasm of colon, unspecified: Secondary | ICD-10-CM | POA: Insufficient documentation

## 2013-08-24 DIAGNOSIS — Q438 Other specified congenital malformations of intestine: Secondary | ICD-10-CM | POA: Insufficient documentation

## 2013-08-24 DIAGNOSIS — Z87891 Personal history of nicotine dependence: Secondary | ICD-10-CM | POA: Insufficient documentation

## 2013-08-24 DIAGNOSIS — K621 Rectal polyp: Secondary | ICD-10-CM | POA: Diagnosis not present

## 2013-08-24 DIAGNOSIS — I10 Essential (primary) hypertension: Secondary | ICD-10-CM | POA: Insufficient documentation

## 2013-08-24 DIAGNOSIS — D751 Secondary polycythemia: Secondary | ICD-10-CM | POA: Insufficient documentation

## 2013-08-24 DIAGNOSIS — Z79899 Other long term (current) drug therapy: Secondary | ICD-10-CM | POA: Diagnosis not present

## 2013-08-24 DIAGNOSIS — I739 Peripheral vascular disease, unspecified: Secondary | ICD-10-CM | POA: Insufficient documentation

## 2013-08-24 HISTORY — PX: COLONOSCOPY: SHX5424

## 2013-08-24 SURGERY — COLONOSCOPY
Anesthesia: Moderate Sedation

## 2013-08-24 MED ORDER — SODIUM CHLORIDE 0.9 % IJ SOLN
INTRAMUSCULAR | Status: AC
  Filled 2013-08-24: qty 10

## 2013-08-24 MED ORDER — MEPERIDINE HCL 100 MG/ML IJ SOLN
INTRAMUSCULAR | Status: AC
  Filled 2013-08-24: qty 2

## 2013-08-24 MED ORDER — MIDAZOLAM HCL 5 MG/5ML IJ SOLN
INTRAMUSCULAR | Status: AC
  Filled 2013-08-24: qty 10

## 2013-08-24 MED ORDER — MEPERIDINE HCL 100 MG/ML IJ SOLN
INTRAMUSCULAR | Status: DC | PRN
  Administered 2013-08-24: 25 mg via INTRAVENOUS
  Administered 2013-08-24: 50 mg via INTRAVENOUS

## 2013-08-24 MED ORDER — PROMETHAZINE HCL 25 MG/ML IJ SOLN
INTRAMUSCULAR | Status: AC
  Filled 2013-08-24: qty 1

## 2013-08-24 MED ORDER — STERILE WATER FOR IRRIGATION IR SOLN
Status: DC | PRN
  Administered 2013-08-24: 10:00:00

## 2013-08-24 MED ORDER — SODIUM CHLORIDE 0.9 % IV SOLN
INTRAVENOUS | Status: DC
  Administered 2013-08-24: 09:00:00 via INTRAVENOUS

## 2013-08-24 MED ORDER — PROMETHAZINE HCL 25 MG/ML IJ SOLN
12.5000 mg | Freq: Once | INTRAMUSCULAR | Status: AC
  Administered 2013-08-24: 12.5 mg via INTRAVENOUS

## 2013-08-24 MED ORDER — MIDAZOLAM HCL 5 MG/5ML IJ SOLN
INTRAMUSCULAR | Status: DC | PRN
  Administered 2013-08-24: 2 mg via INTRAVENOUS
  Administered 2013-08-24 (×2): 1 mg via INTRAVENOUS

## 2013-08-24 NOTE — Progress Notes (Signed)
REVIEWED.  

## 2013-08-24 NOTE — H&P (Signed)
Primary Care Physician:  Tula Nakayama, MD Primary Gastroenterologist:  Dr. Oneida Alar  Pre-Procedure History & Physical: HPI:  Carla [name] is a 68 y.o. female here for  BRBPR.  Past Medical History  Diagnosis Date  . ASCVD (arteriosclerotic cardiovascular disease)     No critical disease on cath in 8/97: mild AI with trival, if any l AS; negative stress nuclear in 2010  . Peripheral vascular disease     stauts post aortabifemoral graft    . Tobacco abuse     has stopped  . Chronic bronchitis   . Hypertension   . Fasting hyperglycemia   . Elevated hemoglobin A1c   . Hydronephrosis   . GERD (gastroesophageal reflux disease)   . Back pain   . Depression   . Osteoporosis   . Secondary erythrocytosis 08/05/2012    Secondary to COPD  . Cervical disc herniation   . Colon polyps     Past Surgical History  Procedure Laterality Date  . Cholecystectomy    . Aorta bifem graft    . Right kidney surgery following damage during arterial surgery    . Partial hysterectomy    . Total abdominal hysterectomy w/ bilateral salpingoophorectomy  1975  . Lysis of adhesions  2000  . Abdominal hysterectomy    . Colonoscopy  06/23/2006    Dr. Kelsei Defino:Normal colon without evidence of polyps, masses, inflammatory changes/Normal retroflex view of the rectum    Prior to Admission medications   Medication Sig Start Date End Date Taking? Authorizing Provider  ALPRAZolam (XANAX) 0.5 MG tablet TAKE ONE TABLET BY MOUTH TWICE DAILY. 07/26/13  Yes Fayrene Helper, MD  amLODipine (NORVASC) 5 MG tablet Take 5 mg by mouth daily. 08/11/12  Yes Fayrene Helper, MD  aspirin EC 81 MG tablet Take 81 mg by mouth daily.   Yes Historical Provider, MD  azithromycin (ZITHROMAX) 250 MG tablet Two tablets on day one , then one tablet once daily for an additional 4 days 08/18/13  Yes Fayrene Helper, MD  benazepril (LOTENSIN) 10 MG tablet Take 1 tablet (10 mg total) by mouth daily. 08/18/13  Yes Fayrene Helper, MD  CALCIUM PO Take 1 tablet by mouth daily.   Yes Historical Provider, MD  Cholecalciferol (VITAMIN D) 2000 UNITS CAPS Take 1 capsule by mouth daily.   Yes Historical Provider, MD  docusate sodium (CORRECTOL EXTRA GENTLE) 100 MG capsule Take 300 mg by mouth daily as needed for mild constipation or moderate constipation.   Yes Historical Provider, MD  HYDROcodone-acetaminophen [facility]) 10-325 MG per tablet One three times daily 08/19/13 09/19/13 Yes Fayrene Helper, MD  Linaclotide [facility]) 290 MCG CAPS capsule Take 1 capsule (290 mcg total) by mouth daily. 08/12/13  Yes Orvil Feil, NP  lubiprostone (AMITIZA) 24 MCG capsule Take 1 capsule (24 mcg total) by mouth daily with breakfast. 07/21/13  Yes Fayrene Helper, MD  Multiple Vitamin (MULTIVITAMIN WITH MINERALS) TABS Take 1 tablet by mouth every evening.    Yes Historical Provider, MD  Omega-3 Fatty Acids (FISH OIL PO) Take 1 capsule by mouth daily.   Yes Historical Provider, MD  potassium chloride (K-DUR) 10 MEQ tablet Take 1 tablet (10 mEq total) by mouth 2 (two) times daily. 07/14/13  Yes Ezequiel Essex, MD  potassium chloride (KLOR-CON 10) 10 MEQ tablet Take 1 tablet (10 mEq total) by mouth daily. 07/21/13  Yes Fayrene Helper, MD  rosuvastatin (CRESTOR) 40 MG tablet Take 40 mg by mouth  at bedtime.   Yes Historical Provider, MD  terbinafine (LAMISIL) 250 MG tablet Take 1 tablet (250 mg total) by mouth daily. 08/18/13  Yes Fayrene Helper, MD    Allergies as of 08/12/2013  . (No Known Allergies)    Family History  Problem Relation Age of Onset  . Heart attack Mother   . Heart attack Father   . COPD Sister   . Diabetes Sister   . Diabetes Sister   . Coronary artery disease Sister   . Diabetes Sister   . Heart disease Sister   . COPD Sister   . Colon cancer Neg Hx     History   Social History  . Marital Status: Married    Spouse Name: N/A    Number of Children: 3  . Years of Education: N/A   Occupational  History  . disabled     Social History Main Topics  . Smoking status: Former Smoker -- 0.50 packs/day    Types: Cigarettes  . Smokeless tobacco: Former Systems developer    Quit date: 05/12/2012  . Alcohol Use: No  . Drug Use: No  . Sexual Activity: No   Other Topics Concern  . Not on file   Social History Narrative   2 Children living 1 deceased     Review of Systems: See HPI, otherwise negative ROS   Physical Exam: BP 127/70  Pulse 82  Temp(Src) 98 F (36.7 C) (Oral)  Resp 16  SpO2 99% General:   Alert,  pleasant and cooperative in NAD Head:  Normocephalic and atraumatic. Neck:  Supple; Lungs:  Clear throughout to auscultation.    Heart:  Regular rate and rhythm. Abdomen:  Soft, nontender and nondistended. Normal bowel sounds, without guarding, and without rebound.   Neurologic:  Alert and  oriented x4;  grossly normal neurologically.  Impression/Plan:    BRBPR  PLAN: TCS TODAY

## 2013-08-24 NOTE — Telephone Encounter (Signed)
Called and left message for patient to return call.  

## 2013-08-24 NOTE — Telephone Encounter (Signed)
REVIEWED.  

## 2013-08-24 NOTE — Op Note (Addendum)
[facility] [address] [place], [address]   COLONOSCOPY PROCEDURE REPORT  PATIENT: Carla [name], Carla [name]  MR#: [id number] BIRTHDATE: [birth year] , 68  yrs. old GENDER: Female ENDOSCOPIST: Barney Drain, MD REFERRED IR:[id number] Moshe Cipro, M.D. PROCEDURE DATE:  08/24/2013 PROCEDURE:   Colonoscopy with snare polypectomy and Colonoscopy with cold biopsy polypectomy INDICATIONS:Rectal Bleeding. MEDICATIONS: Demerol 75 mg IV, Versed 4 mg IV, and PREOP: Promethazine (Phenergan) 12.5mg  IV  DESCRIPTION OF PROCEDURE:    Physical exam was performed.  Informed consent was obtained from the patient after explaining the benefits, risks, and alternatives to procedure.  The patient was connected to monitor and placed in left lateral position. Continuous oxygen was provided by nasal cannula and IV medicine administered through an indwelling cannula.  After administration of sedation and rectal exam, the patients rectum was intubated and the EC-[id number] ([id number])  colonoscope was advanced under direct visualization to the ileum.  The scope was removed slowly by carefully examining the color, texture, anatomy, and integrity mucosa on the way out.  The patient was recovered in endoscopy and discharged home in satisfactory condition.    COLON FINDINGS: The mucosa appeared normal in the terminal ileum.  , Two sessile polyps measuring 3-4 mm in size were found in the rectum and distal transverse colon.  A polypectomy was performed with cold forceps.  MILD ERYTHEMA WITH MUCOSAL SPARING. COLD FORCEPS BIOPSIES OBTAINED. A sessile polyp measuring 6 mm in size was found in the sigmoid colon.  A polypectomy was performed using snare cautery.  , There was mild diverticulosis noted in the sigmoid colon with associated angulation, & The colon IS redundant. Manual abdominal counter-pressure was used to reach the cecum. The patient was moved on to their back to reach the cecum.  PREP QUALITY:  excellent.  CECAL W/D TIME: 19 minutes COMPLICATIONS: None  ENDOSCOPIC IMPRESSION: 1.   Normal mucosa in the terminal ileum 2.   THREE COLON POLYPS REMOVED 3.   Mild diverticulosis in the sigmoid colon 4.   The LEFT colon IS ANGULATED AND redundant  RECOMMENDATIONS: [place] WATER EAT FIBER CONTINUE LINZESS. TAKE WITH A MEAL IF NEEDED TO ACHIEVE 1-2 BMs A WEEK. FOLLOW A HIGH FIBER DIET.  AVOID ITEMS THAT CAUSE BLOATING. BIOPSY RESULTS SHOULD BE BACK IN 7 DAYS.  Next colonoscopy in 5-10 years. CONSIDER PEDS COLONOSCOPE.     _______________________________ Lorrin MaisBarney Drain, MD 08/24/2013 11:38 AM Revised: 08/24/2013 11:38 AM

## 2013-08-24 NOTE — H&P (Deleted)
Primary Care Physician:  Tula Nakayama, MD Primary Gastroenterologist:  Dr. Oneida Alar  Pre-Procedure History & Physical: HPI:  Carla [name] is a 68 y.o. female here for   BRBPR after constipation/laxatives. NL Hb.  Past Medical History  Diagnosis Date  . ASCVD (arteriosclerotic cardiovascular disease)     No critical disease on cath in 8/97: mild AI with trival, if any l AS; negative stress nuclear in 2010  . Peripheral vascular disease     stauts post aortabifemoral graft    . Tobacco abuse     has stopped  . Chronic bronchitis   . Hypertension   . Fasting hyperglycemia   . Elevated hemoglobin A1c   . Hydronephrosis   . GERD (gastroesophageal reflux disease)   . Back pain   . Depression   . Osteoporosis   . Secondary erythrocytosis 08/05/2012    Secondary to COPD  . Cervical disc herniation   . Colon polyps     Past Surgical History  Procedure Laterality Date  . Cholecystectomy    . Aorta bifem graft    . Right kidney surgery following damage during arterial surgery    . Partial hysterectomy    . Total abdominal hysterectomy w/ bilateral salpingoophorectomy  1975  . Lysis of adhesions  2000  . Abdominal hysterectomy    . Colonoscopy  06/23/2006    Dr. Chigozie Basaldua:Normal colon without evidence of polyps, masses, inflammatory changes/Normal retroflex view of the rectum    Prior to Admission medications   Medication Sig Start Date End Date Taking? Authorizing Provider  ALPRAZolam (XANAX) 0.5 MG tablet TAKE ONE TABLET BY MOUTH TWICE DAILY. 07/26/13  Yes Fayrene Helper, MD  amLODipine (NORVASC) 5 MG tablet Take 5 mg by mouth daily. 08/11/12  Yes Fayrene Helper, MD  aspirin EC 81 MG tablet Take 81 mg by mouth daily.   Yes Historical Provider, MD  azithromycin (ZITHROMAX) 250 MG tablet Two tablets on day one , then one tablet once daily for an additional 4 days 08/18/13  Yes Fayrene Helper, MD  benazepril (LOTENSIN) 10 MG tablet Take 1 tablet (10 mg total) by mouth  daily. 08/18/13  Yes Fayrene Helper, MD  CALCIUM PO Take 1 tablet by mouth daily.   Yes Historical Provider, MD  Cholecalciferol (VITAMIN D) 2000 UNITS CAPS Take 1 capsule by mouth daily.   Yes Historical Provider, MD  docusate sodium (CORRECTOL EXTRA GENTLE) 100 MG capsule Take 300 mg by mouth daily as needed for mild constipation or moderate constipation.   Yes Historical Provider, MD  HYDROcodone-acetaminophen [facility]) 10-325 MG per tablet One three times daily 08/19/13 09/19/13 Yes Fayrene Helper, MD  Linaclotide [facility]) 290 MCG CAPS capsule Take 1 capsule (290 mcg total) by mouth daily. 08/12/13  Yes Orvil Feil, NP  lubiprostone (AMITIZA) 24 MCG capsule Take 1 capsule (24 mcg total) by mouth daily with breakfast. 07/21/13  Yes Fayrene Helper, MD  Multiple Vitamin (MULTIVITAMIN WITH MINERALS) TABS Take 1 tablet by mouth every evening.    Yes Historical Provider, MD  Omega-3 Fatty Acids (FISH OIL PO) Take 1 capsule by mouth daily.   Yes Historical Provider, MD  potassium chloride (K-DUR) 10 MEQ tablet Take 1 tablet (10 mEq total) by mouth 2 (two) times daily. 07/14/13  Yes Ezequiel Essex, MD  potassium chloride (KLOR-CON 10) 10 MEQ tablet Take 1 tablet (10 mEq total) by mouth daily. 07/21/13  Yes Fayrene Helper, MD  rosuvastatin (CRESTOR) 40 MG tablet  Take 40 mg by mouth at bedtime.   Yes Historical Provider, MD  terbinafine (LAMISIL) 250 MG tablet Take 1 tablet (250 mg total) by mouth daily. 08/18/13  Yes Fayrene Helper, MD    Allergies as of 08/12/2013  . (No Known Allergies)    Family History  Problem Relation Age of Onset  . Heart attack Mother   . Heart attack Father   . COPD Sister   . Diabetes Sister   . Diabetes Sister   . Coronary artery disease Sister   . Diabetes Sister   . Heart disease Sister   . COPD Sister   . Colon cancer Neg Hx     History   Social History  . Marital Status: Married    Spouse Name: N/A    Number of Children: 3  . Years of  Education: N/A   Occupational History  . disabled     Social History Main Topics  . Smoking status: Former Smoker -- 0.50 packs/day    Types: Cigarettes  . Smokeless tobacco: Former Systems developer    Quit date: 05/12/2012  . Alcohol Use: No  . Drug Use: No  . Sexual Activity: No   Other Topics Concern  . Not on file   Social History Narrative   2 Children living 1 deceased     Review of Systems: See HPI, otherwise negative ROS   Physical Exam: BP 119/40  Pulse 80  Temp(Src) 98 F (36.7 C) (Oral)  Resp 13  SpO2 100% General:   Alert,  pleasant and cooperative in NAD Head:  Normocephalic and atraumatic. Neck:  Supple; Lungs:  Clear throughout to auscultation.    Heart:  Regular rate and rhythm. Abdomen:  Soft, nontender and nondistended. Normal bowel sounds, without guarding, and without rebound.   Neurologic:  Alert and  oriented x4;  grossly normal neurologically.  Impression/Plan:    BRBPR.  PLAN: TCS TODAY. PT DECLINED IH BANDING AT THIS TIME.

## 2013-08-24 NOTE — Discharge Instructions (Signed)
You had 3 polyps removed. You have small internal hemorrhoids and diverticulosis IN YOUR LEFT COLON.    DRINK WATER TO KEEP YOUR URINE LIGHT YELLOW.  CONTINUE LINZESS. TAKE 30 MINS PRIOR TO A MEAL IF AFTER 2 WEEKS YOU ARE NOT HAVING A BM EVERY 1 TO 2 DAYS THEN YOU SHOULD TAKE LINZESS WITH MEALS.  FOLLOW A HIGH FIBER DIET. AVOID ITEMS THAT CAUSE BLOATING. SEE INFO BELOW.  YOUR BIOPSY RESULTS SHOULD BE BACK IN 7 DAYS.  Next colonoscopy in 5-10 years.   Colonoscopy Care After Read the instructions outlined below and refer to this sheet in the next week. These discharge instructions provide you with general information on caring for yourself after you leave the hospital. While your treatment has been planned according to the most current medical practices available, unavoidable complications occasionally occur. If you have any problems or questions after discharge, call DR. Fawna Cranmer, [phone].  ACTIVITY  You may resume your regular activity, but move at a slower pace for the next 24 hours.   Take frequent rest periods for the next 24 hours.   Walking will help get rid of the air and reduce the bloated feeling in your belly (abdomen).   No driving for 24 hours (because of the medicine (anesthesia) used during the test).   You may shower.   Do not sign any important legal documents or operate any machinery for 24 hours (because of the anesthesia used during the test).    NUTRITION  Drink plenty of fluids.   You may resume your normal diet as instructed by your doctor.   Begin with a light meal and progress to your normal diet. Heavy or fried foods are harder to digest and may make you feel sick to your stomach (nauseated).   Avoid alcoholic beverages for 24 hours or as instructed.    MEDICATIONS  You may resume your normal medications.   WHAT YOU CAN EXPECT TODAY  Some feelings of bloating in the abdomen.   Passage of more gas than usual.   Spotting of blood in  your stool or on the toilet paper  .  IF YOU HAD POLYPS REMOVED DURING THE COLONOSCOPY:  Eat a soft diet IF YOU HAVE NAUSEA, BLOATING, ABDOMINAL PAIN, OR VOMITING.    FINDING OUT THE RESULTS OF YOUR TEST Not all test results are available during your visit. DR. Oneida Alar WILL CALL YOU WITHIN 7 DAYS OF YOUR PROCEDUE WITH YOUR RESULTS. Do not assume everything is normal if you have not heard from DR. Lorena Clearman IN ONE WEEK, CALL HER OFFICE AT [phone].  SEEK IMMEDIATE MEDICAL ATTENTION AND CALL THE OFFICE: [phone] IF:  You have more than a spotting of blood in your stool.   Your belly is swollen (abdominal distention).   You are nauseated or vomiting.   You have a temperature over 101F.   You have abdominal pain or discomfort that is severe or gets worse throughout the day.  Polyps, Colon  A polyp is extra tissue that grows inside your body. Colon polyps grow in the large intestine. The large intestine, also called the colon, is part of your digestive system. It is a long, hollow tube at the end of your digestive tract where your body makes and stores stool. Most polyps are not dangerous. They are benign. This means they are not cancerous. But over time, some types of polyps can turn into cancer. Polyps that are smaller than a pea are usually not harmful. But larger polyps could  someday become or may already be cancerous. To be safe, doctors remove all polyps and test them.   WHO GETS POLYPS? Anyone can get polyps, but certain people are more likely than others. You may have a greater chance of getting polyps if:  You are over 50.   You have had polyps before.   Someone in your family has had polyps.   Someone in your family has had cancer of the large intestine.   Find out if someone in your family has had polyps. You may also be more likely to get polyps if you:   Eat a lot of fatty foods   Smoke   Drink alcohol   Do not exercise  Eat too much   TREATMENT  The  caregiver will remove the polyp during sigmoidoscopy or colonoscopy.  PREVENTION There is not one sure way to prevent polyps. You might be able to lower your risk of getting them if you:  Eat more fruits and vegetables and less fatty food.   Do not smoke.   Avoid alcohol.   Exercise every day.   Lose weight if you are overweight.   Eating more calcium and folate can also lower your risk of getting polyps. Some foods that are rich in calcium are milk, cheese, and broccoli. Some foods that are rich in folate are chickpeas, kidney beans, and spinach.   High-Fiber Diet A high-fiber diet changes your normal diet to include more whole grains, legumes, fruits, and vegetables. Changes in the diet involve replacing refined carbohydrates with unrefined foods. The calorie level of the diet is essentially unchanged. The Dietary Reference Intake (recommended amount) for adult males is 38 grams per day. For adult females, it is 25 grams per day. Pregnant and lactating women should consume 28 grams of fiber per day. Fiber is the intact part of a plant that is not broken down during digestion. Functional fiber is fiber that has been isolated from the plant to provide a beneficial effect in the body. PURPOSE  Increase stool bulk.   Ease and regulate bowel movements.   Lower cholesterol.  INDICATIONS THAT YOU NEED MORE FIBER  Constipation and hemorrhoids.   Uncomplicated diverticulosis (intestine condition) and irritable bowel syndrome.   Weight management.   As a protective measure against hardening of the arteries (atherosclerosis), diabetes, and cancer.   GUIDELINES FOR INCREASING FIBER IN THE DIET  Start adding fiber to the diet slowly. A gradual increase of about 5 more grams (2 slices of whole-wheat bread, 2 servings of most fruits or vegetables, or 1 bowl of high-fiber cereal) per day is best. Too rapid an increase in fiber may result in constipation, flatulence, and bloating.   Drink  enough water and fluids to keep your urine clear or pale yellow. Water, juice, or caffeine-free drinks are recommended. Not drinking enough fluid may cause constipation.   Eat a variety of high-fiber foods rather than one type of fiber.   Try to increase your intake of fiber through using high-fiber foods rather than fiber pills or supplements that contain small amounts of fiber.   The goal is to change the types of food eaten. Do not supplement your present diet with high-fiber foods, but replace foods in your present diet.  INCLUDE A VARIETY OF FIBER SOURCES  Replace refined and processed grains with whole grains, canned fruits with fresh fruits, and incorporate other fiber sources. White rice, white breads, and most bakery goods contain little or no fiber.   [place]  whole-grain rice, buckwheat oats, and many fruits and vegetables are all good sources of fiber. These include: broccoli, Brussels sprouts, cabbage, cauliflower, beets, sweet potatoes, white potatoes (skin on), carrots, tomatoes, eggplant, squash, berries, fresh fruits, and dried fruits.   Cereals appear to be the richest source of fiber. Cereal fiber is found in whole grains and bran. Bran is the fiber-rich outer coat of cereal grain, which is largely removed in refining. In whole-grain cereals, the bran remains. In breakfast cereals, the largest amount of fiber is found in those with "bran" in their names. The fiber content is sometimes indicated on the label.   You may need to include additional fruits and vegetables each day.   In baking, for 1 cup white flour, you may use the following substitutions:   1 cup whole-wheat flour minus 2 tablespoons.   1/2 cup white flour plus 1/2 cup whole-wheat flour.   Diverticulosis Diverticulosis is a common condition that develops when small pouches (diverticula) form in the wall of the colon. The risk of diverticulosis increases with age. It happens more often in people who eat a low-fiber  diet. Most individuals with diverticulosis have no symptoms. Those individuals with symptoms usually experience belly (abdominal) pain, constipation, or loose stools (diarrhea).  HOME CARE INSTRUCTIONS  Increase the amount of fiber in your diet as directed by your caregiver or dietician. This may reduce symptoms of diverticulosis.   Drink at least 6 to 8 glasses of water each day to prevent constipation.   Try not to strain when you have a bowel movement.   Avoiding nuts and seeds to prevent complications is still an uncertain benefit.       FOODS HAVING HIGH FIBER CONTENT INCLUDE:  Fruits. Apple, peach, pear, tangerine, raisins, prunes.   Vegetables. Brussels sprouts, asparagus, broccoli, cabbage, carrot, cauliflower, romaine lettuce, spinach, summer squash, tomato, winter squash, zucchini.   Starchy Vegetables. Baked beans, kidney beans, lima beans, split peas, lentils, potatoes (with skin).   Grains. Whole wheat bread, brown rice, bran flake cereal, plain oatmeal, white rice, shredded wheat, bran muffins.    SEEK IMMEDIATE MEDICAL CARE IF:  You develop increasing pain or severe bloating.   You have an oral temperature above 101F.   You develop vomiting or bowel movements that are bloody or black.   Hemorrhoids Hemorrhoids are dilated (enlarged) veins around the rectum. Sometimes clots will form in the veins. This makes them swollen and painful. These are called thrombosed hemorrhoids. Causes of hemorrhoids include:  Constipation.   Straining to have a bowel movement.   HEAVY LIFTING HOME CARE INSTRUCTIONS  Eat a well balanced diet and drink 6 to 8 glasses of water every day to avoid constipation. You may also use a bulk laxative.   Avoid straining to have bowel movements.   Keep anal area dry and clean.   Do not use a donut shaped pillow or sit on the toilet for long periods. This increases blood pooling and pain.   Move your bowels when your body has the urge;  this will require less straining and will decrease pain and pressure.

## 2013-08-25 ENCOUNTER — Encounter (HOSPITAL_COMMUNITY): Payer: Self-pay | Admitting: Gastroenterology

## 2013-09-09 ENCOUNTER — Telehealth: Payer: Self-pay | Admitting: Gastroenterology

## 2013-09-09 NOTE — Telephone Encounter (Signed)
Reminder in EPIC 

## 2013-09-09 NOTE — Telephone Encounter (Signed)
Please call pt. She had SERRATED AND A simple adenomas removed from her colon.    DRINK WATER TO KEEP YOUR URINE LIGHT YELLOW.  CONTINUE LINZESS. TAKE 30 MINS PRIOR TO A MEAL.  FOLLOW A HIGH FIBER DIET. AVOID ITEMS THAT CAUSE BLOATING.   FOLLOW UP IN 3 MOS.  E30 AUG 2015 CONSTIPATION, RECTAL BLEEDING  Next colonoscopy in 5 years.

## 2013-09-10 NOTE — Telephone Encounter (Signed)
LMOM to call.

## 2013-09-10 NOTE — Telephone Encounter (Signed)
Pt called and was informed.  

## 2013-09-13 ENCOUNTER — Other Ambulatory Visit: Payer: Self-pay | Admitting: Family Medicine

## 2013-09-15 ENCOUNTER — Telehealth: Payer: Self-pay | Admitting: *Deleted

## 2013-09-15 NOTE — Telephone Encounter (Signed)
REMINDER IN EPIC °

## 2013-09-15 NOTE — Telephone Encounter (Signed)
Pt called stating Dr. Oneida Alar told her to call back about her colonoscopy. Please advise [phone] or [phone]

## 2013-09-16 ENCOUNTER — Other Ambulatory Visit: Payer: Self-pay | Admitting: Family Medicine

## 2013-09-20 NOTE — Telephone Encounter (Signed)
Pt called stating Linzess is to high it is 70$ pt would like to take the [place], pt said Dr. Griffin Dakin nurse told her it would be okay and to call here, pt would like to know if it is okay to take. Please advise

## 2013-09-21 NOTE — Telephone Encounter (Signed)
Called and spoke with pt. She cannot afford linzess. Advised her that whichever medication for constipation worked better for her, either linzess or amitiza was ok to take, but to not take both. She is aware to take [place] with food. She already has rx for amitiza from Dr. Moshe Cipro.

## 2013-09-22 ENCOUNTER — Other Ambulatory Visit: Payer: Self-pay

## 2013-09-22 DIAGNOSIS — M5090 Cervical disc disorder, unspecified, unspecified cervical region: Secondary | ICD-10-CM

## 2013-09-22 MED ORDER — HYDROCODONE-ACETAMINOPHEN 10-325 MG PO TABS: ORAL_TABLET | ORAL | Status: DC

## 2013-09-25 ENCOUNTER — Encounter: Payer: Self-pay | Admitting: Family Medicine

## 2013-09-25 ENCOUNTER — Telehealth: Payer: Self-pay | Admitting: Family Medicine

## 2013-09-25 DIAGNOSIS — E559 Vitamin D deficiency, unspecified: Secondary | ICD-10-CM

## 2013-09-25 DIAGNOSIS — E785 Hyperlipidemia, unspecified: Secondary | ICD-10-CM

## 2013-09-25 DIAGNOSIS — I10 Essential (primary) hypertension: Secondary | ICD-10-CM

## 2013-09-25 DIAGNOSIS — E1151 Type 2 diabetes mellitus with diabetic peripheral angiopathy without gangrene: Secondary | ICD-10-CM

## 2013-09-25 NOTE — Assessment & Plan Note (Signed)
Controlled,  Diet only at this time Patient advised to reduce carb and sweets, commit to regular physical activity, needs rept lab in 4 month

## 2013-09-25 NOTE — Assessment & Plan Note (Signed)
rept labs to ensure resolution, pt developed this due to acute illness, metformin d/c as a result

## 2013-09-25 NOTE — Assessment & Plan Note (Signed)
Continue xanax as before, denies depression,, and has d/c antidepressant medication

## 2013-09-25 NOTE — Progress Notes (Signed)
   Subjective:    Patient ID: Carla [name], female    DOB: 1945-04-07, 68 y.o.   MRN: [id number]  HPI Pt in for f/u of recent hospitalization for acute colitis, and has colonoscopy planned in the near future. Denies any current abdominal pain or bloody stool, no fever or chills. First episode. C/O uncontrolled left shoulder and upper ext pain, has established cervical disc disease, and has been putting off surgery since last Summer due to other health issues in her immediate family   Review of Systems See HPI Denies recent fever or chills. Denies sinus pressure, nasal congestion, ear pain or sore throat. Denies chest congestion, productive cough or wheezing. Denies chest pains, palpitations and leg swelling Denies abdominal pain, nausea, vomiting,diarrhea or constipation.   Denies dysuria, frequency, hesitancy or incontinence.  Denies headaches, seizures, numbness, or tingling. Denies uncontrolled  depression, anxiety or insomnia. Denies skin break down or rash.        Objective:   Physical Exam BP 134/78  Pulse 76  Resp 18  Ht 5' 1.75" (1.568 m)  Wt 136 lb 1.3 oz (61.725 kg)  BMI 25.11 kg/m2  SpO2 97% Patient alert and oriented and in no cardiopulmonary distress.  HEENT: No facial asymmetry, EOMI,   oropharynx pink and moist.  Neck decreased though adequate ROM no JVD, no mass.  Chest: Clear to auscultation bilaterally.decdreased though adequate air entry   CVS: S1, S2 no murmurs, no S3.Regular rate  ABD: Soft non tender. Bowel sounds normal  Ext: No edema  MS: Adequate though reduced  ROM cervical and thoracic  spine, decreased ROM left shoulder, normal ROM  hips and knees.  Skin: Intact, no ulcerations or rash noted.  Psych: Good eye contact, normal affect. Memory intact not anxious or depressed appearing.  CNS: CN 2-12 intact, power,  normal throughout.no focal deficits noted.        Assessment & Plan:  Well controlled type 2 diabetes mellitus with  peripheral circulatory disorder Controlled,  Diet only at this time Patient advised to reduce carb and sweets, commit to regular physical activity, needs rept lab in 4 month  Acute renal failure rept labs to ensure resolution, pt developed this due to acute illness, metformin d/c as a result  Shoulder pain, left C/o worsened pain and limitation in mobility, refer to ortho  Anxiety and depression Continue xanax as before, denies depression,, and has d/c antidepressant medication  HYPERLIPIDEMIA Updated lab needed, will order and contact pt. Hyperlipidemia:Low fat diet discussed and encouraged.    Cervical neck pain with evidence of disc disease Chronic pain medication as before  Constipation Will need to change med due to formulary coverage High fiber diet, regular exercise and adequate water intake discussed and encouraged also

## 2013-09-25 NOTE — Telephone Encounter (Signed)
Pls contact pt, she needs fasting lipid, cmp and EGFr, hBa1C and vit D mid August, pls order also, thanks

## 2013-09-25 NOTE — Assessment & Plan Note (Signed)
Updated lab needed, will order and contact pt. Hyperlipidemia:Low fat diet discussed and encouraged.

## 2013-09-25 NOTE — Assessment & Plan Note (Signed)
Chronic pain medication as before

## 2013-09-25 NOTE — Assessment & Plan Note (Signed)
Will need to change med due to formulary coverage High fiber diet, regular exercise and adequate water intake discussed and encouraged also

## 2013-09-25 NOTE — Assessment & Plan Note (Signed)
C/o worsened pain and limitation in mobility, refer to ortho

## 2013-09-28 NOTE — Addendum Note (Signed)
Addended by: Denman George B on: 09/28/2013 10:51 AM   Modules accepted: Orders

## 2013-09-28 NOTE — Telephone Encounter (Signed)
Lab order and letter mailed to patient with request.

## 2013-09-29 NOTE — Telephone Encounter (Signed)
REVIEWED.  

## 2013-10-18 ENCOUNTER — Telehealth: Payer: Self-pay

## 2013-10-18 NOTE — Telephone Encounter (Signed)
Patient states that since she quit smoking last year she has been coughing up whitish phlegm that gets in her throat. I told her it was probably from the emphysema/chronic bronchitis but I would check and see if anything could be recommended.

## 2013-10-19 ENCOUNTER — Other Ambulatory Visit: Payer: Self-pay

## 2013-10-19 DIAGNOSIS — M5090 Cervical disc disorder, unspecified, unspecified cervical region: Secondary | ICD-10-CM

## 2013-10-19 MED ORDER — HYDROCODONE-ACETAMINOPHEN 10-325 MG PO TABS: ORAL_TABLET | ORAL | Status: DC

## 2013-10-19 NOTE — Telephone Encounter (Signed)
Also  possible that she has uncontrolled allergies causing post nasal drainagewith cough.needs to use daily flonase 2 puffs per nostril and /or daily claritin 10mg  one daily, if she agrees pls send in one /boht both

## 2013-10-20 ENCOUNTER — Other Ambulatory Visit: Payer: Self-pay

## 2013-10-20 MED ORDER — FLUTICASONE PROPIONATE 50 MCG/ACT NA SUSP: 2.0000 | Freq: Every day | NASAL | Status: DC

## 2013-10-20 MED ORDER — LORATADINE 10 MG PO TABS: 10.0000 mg | ORAL_TABLET | Freq: Every day | ORAL | Status: DC

## 2013-10-20 NOTE — Telephone Encounter (Signed)
Pt aware and meds sent in for her

## 2013-11-01 ENCOUNTER — Encounter: Payer: Self-pay | Admitting: Gastroenterology

## 2013-11-11 ENCOUNTER — Other Ambulatory Visit: Payer: Self-pay

## 2013-11-11 DIAGNOSIS — M5090 Cervical disc disorder, unspecified, unspecified cervical region: Secondary | ICD-10-CM

## 2013-11-11 MED ORDER — HYDROCODONE-ACETAMINOPHEN 10-325 MG PO TABS: ORAL_TABLET | ORAL | Status: DC

## 2013-11-12 LAB — COMPLETE METABOLIC PANEL WITH GFR
ALBUMIN: 4.8 g/dL (ref 3.5–5.2)
ALT: 15 U/L (ref 0–35)
AST: 21 U/L (ref 0–37)
Alkaline Phosphatase: 64 U/L (ref 39–117)
BUN: 18 mg/dL (ref 6–23)
CO2: 28 meq/L (ref 19–32)
Calcium: 10.4 mg/dL (ref 8.4–10.5)
Chloride: 105 mEq/L (ref 96–112)
Creat: 1.17 mg/dL — ABNORMAL HIGH (ref 0.50–1.10)
GFR, EST AFRICAN AMERICAN: 55 mL/min — AB
GFR, EST NON AFRICAN AMERICAN: 48 mL/min — AB
GLUCOSE: 97 mg/dL (ref 70–99)
POTASSIUM: 4.7 meq/L (ref 3.5–5.3)
Sodium: 141 mEq/L (ref 135–145)
Total Bilirubin: 0.4 mg/dL (ref 0.2–1.2)
Total Protein: 7.5 g/dL (ref 6.0–8.3)

## 2013-11-12 LAB — LIPID PANEL
Cholesterol: 172 mg/dL (ref 0–200)
HDL: 54 mg/dL (ref >39)
LDL Cholesterol: 77 mg/dL (ref 0–99)
TRIGLYCERIDES: 206 mg/dL — AB (ref <150)
Total CHOL/HDL Ratio: 3.2 Ratio
VLDL: 41 mg/dL — ABNORMAL HIGH (ref 0–40)

## 2013-11-13 LAB — VITAMIN D 25 HYDROXY (VIT D DEFICIENCY, FRACTURES): Vit D, 25-Hydroxy: 47 ng/mL (ref 30–89)

## 2013-11-13 LAB — HEMOGLOBIN A1C
Hgb A1c MFr Bld: 6.3 % — ABNORMAL HIGH (ref <5.7)
Mean Plasma Glucose: 134 mg/dL — ABNORMAL HIGH (ref <117)

## 2013-11-19 ENCOUNTER — Other Ambulatory Visit: Payer: Self-pay

## 2013-11-19 MED ORDER — ALPRAZOLAM 0.5 MG PO TABS: ORAL_TABLET | ORAL | Status: DC

## 2013-11-24 ENCOUNTER — Other Ambulatory Visit: Payer: Self-pay | Admitting: Family Medicine

## 2013-11-26 ENCOUNTER — Telehealth: Payer: Self-pay

## 2013-11-26 MED ORDER — PREDNISONE 5 MG PO KIT: PACK | ORAL | Status: DC

## 2013-11-26 NOTE — Addendum Note (Signed)
Addended by: Denman George B on: 11/26/2013 01:23 PM   Modules accepted: Orders

## 2013-11-26 NOTE — Telephone Encounter (Signed)
pls send pred 5 mg dose pack # 21 and let her know 

## 2013-11-26 NOTE — Telephone Encounter (Signed)
Patient aware to check with pharmacy after 1 per previous call.

## 2013-11-27 ENCOUNTER — Encounter (HOSPITAL_COMMUNITY): Payer: Self-pay | Admitting: Emergency Medicine

## 2013-11-27 ENCOUNTER — Emergency Department (HOSPITAL_COMMUNITY)
Admission: EM | Admit: 2013-11-27 | Discharge: 2013-11-27 | Disposition: A | Discharge status: Home / Self Care | Payer: Medicare Other | Attending: Emergency Medicine | Admitting: Emergency Medicine

## 2013-11-27 ENCOUNTER — Emergency Department (HOSPITAL_COMMUNITY): Payer: Medicare Other

## 2013-11-27 DIAGNOSIS — Z87448 Personal history of other diseases of urinary system: Secondary | ICD-10-CM | POA: Insufficient documentation

## 2013-11-27 DIAGNOSIS — D751 Secondary polycythemia: Secondary | ICD-10-CM | POA: Insufficient documentation

## 2013-11-27 DIAGNOSIS — Z860100 Personal history of colon polyps, unspecified: Secondary | ICD-10-CM | POA: Insufficient documentation

## 2013-11-27 DIAGNOSIS — I739 Peripheral vascular disease, unspecified: Secondary | ICD-10-CM | POA: Diagnosis not present

## 2013-11-27 DIAGNOSIS — K219 Gastro-esophageal reflux disease without esophagitis: Secondary | ICD-10-CM | POA: Diagnosis not present

## 2013-11-27 DIAGNOSIS — R05 Cough: Secondary | ICD-10-CM | POA: Insufficient documentation

## 2013-11-27 DIAGNOSIS — M810 Age-related osteoporosis without current pathological fracture: Secondary | ICD-10-CM | POA: Diagnosis not present

## 2013-11-27 DIAGNOSIS — Z8739 Personal history of other diseases of the musculoskeletal system and connective tissue: Secondary | ICD-10-CM | POA: Insufficient documentation

## 2013-11-27 DIAGNOSIS — Z79899 Other long term (current) drug therapy: Secondary | ICD-10-CM | POA: Insufficient documentation

## 2013-11-27 DIAGNOSIS — IMO0002 Reserved for concepts with insufficient information to code with codable children: Secondary | ICD-10-CM | POA: Diagnosis not present

## 2013-11-27 DIAGNOSIS — J40 Bronchitis, not specified as acute or chronic: Secondary | ICD-10-CM

## 2013-11-27 DIAGNOSIS — F3289 Other specified depressive episodes: Secondary | ICD-10-CM | POA: Insufficient documentation

## 2013-11-27 DIAGNOSIS — I10 Essential (primary) hypertension: Secondary | ICD-10-CM | POA: Diagnosis not present

## 2013-11-27 DIAGNOSIS — J441 Chronic obstructive pulmonary disease with (acute) exacerbation: Secondary | ICD-10-CM | POA: Insufficient documentation

## 2013-11-27 DIAGNOSIS — R059 Cough, unspecified: Secondary | ICD-10-CM | POA: Diagnosis present

## 2013-11-27 DIAGNOSIS — Z7982 Long term (current) use of aspirin: Secondary | ICD-10-CM | POA: Diagnosis not present

## 2013-11-27 DIAGNOSIS — F329 Major depressive disorder, single episode, unspecified: Secondary | ICD-10-CM | POA: Diagnosis not present

## 2013-11-27 DIAGNOSIS — Z87891 Personal history of nicotine dependence: Secondary | ICD-10-CM | POA: Diagnosis not present

## 2013-11-27 LAB — CBC WITH DIFFERENTIAL/PLATELET
BASOS ABS: 0 10*3/uL (ref 0.0–0.1)
Basophils Relative: 1 % (ref 0–1)
EOS ABS: 0 10*3/uL (ref 0.0–0.7)
Eosinophils Relative: 1 % (ref 0–5)
HCT: 38.3 % (ref 36.0–46.0)
HEMOGLOBIN: 12.8 g/dL (ref 12.0–15.0)
Lymphocytes Relative: 33 % (ref 12–46)
Lymphs Abs: 2.1 10*3/uL (ref 0.7–4.0)
MCH: 30.2 pg (ref 26.0–34.0)
MCHC: 33.4 g/dL (ref 30.0–36.0)
MCV: 90.3 fL (ref 78.0–100.0)
MONOS PCT: 7 % (ref 3–12)
Monocytes Absolute: 0.4 10*3/uL (ref 0.1–1.0)
NEUTROS PCT: 60 % (ref 43–77)
Neutro Abs: 3.7 10*3/uL (ref 1.7–7.7)
PLATELETS: 269 10*3/uL (ref 150–400)
RBC: 4.24 MIL/uL (ref 3.87–5.11)
RDW: 13.8 % (ref 11.5–15.5)
WBC: 6.3 10*3/uL (ref 4.0–10.5)

## 2013-11-27 LAB — BASIC METABOLIC PANEL
Anion gap: 11 (ref 5–15)
BUN: 15 mg/dL (ref 6–23)
CO2: 27 mEq/L (ref 19–32)
Calcium: 10.9 mg/dL — ABNORMAL HIGH (ref 8.4–10.5)
Chloride: 104 mEq/L (ref 96–112)
Creatinine, Ser: 1.1 mg/dL (ref 0.50–1.10)
GFR calc non Af Amer: 50 mL/min — ABNORMAL LOW (ref >90)
GFR, EST AFRICAN AMERICAN: 58 mL/min — AB (ref >90)
Glucose, Bld: 154 mg/dL — ABNORMAL HIGH (ref 70–99)
Potassium: 3.6 mEq/L — ABNORMAL LOW (ref 3.7–5.3)
Sodium: 142 mEq/L (ref 137–147)

## 2013-11-27 MED ORDER — AZITHROMYCIN 250 MG PO TABS: ORAL_TABLET | ORAL | Status: DC

## 2013-11-27 MED ORDER — IPRATROPIUM BROMIDE 0.02 % IN SOLN
0.5000 mg | Freq: Once | RESPIRATORY_TRACT | Status: DC
  Filled 2013-11-27: qty 2.5

## 2013-11-27 MED ORDER — IPRATROPIUM-ALBUTEROL 0.5-2.5 (3) MG/3ML IN SOLN
3.0000 mL | Freq: Once | RESPIRATORY_TRACT | Status: AC
  Administered 2013-11-27: 3 mL via RESPIRATORY_TRACT

## 2013-11-27 MED ORDER — IPRATROPIUM-ALBUTEROL 0.5-2.5 (3) MG/3ML IN SOLN
RESPIRATORY_TRACT | Status: AC
  Filled 2013-11-27: qty 3

## 2013-11-27 MED ORDER — ALBUTEROL SULFATE (2.5 MG/3ML) 0.083% IN NEBU
5.0000 mg | INHALATION_SOLUTION | Freq: Once | RESPIRATORY_TRACT | Status: AC
  Administered 2013-11-27: 2.5 mg via RESPIRATORY_TRACT
  Filled 2013-11-27: qty 6

## 2013-11-27 MED ORDER — ALBUTEROL SULFATE HFA 108 (90 BASE) MCG/ACT IN AERS
2.0000 | INHALATION_SPRAY | RESPIRATORY_TRACT | Status: DC | PRN
  Filled 2013-11-27: qty 6.7

## 2013-11-27 MED ORDER — ALBUTEROL SULFATE (2.5 MG/3ML) 0.083% IN NEBU
2.5000 mg | INHALATION_SOLUTION | Freq: Once | RESPIRATORY_TRACT | Status: AC
  Administered 2013-11-27: 2.5 mg via RESPIRATORY_TRACT
  Filled 2013-11-27: qty 3

## 2013-11-27 MED ORDER — ALBUTEROL SULFATE HFA 108 (90 BASE) MCG/ACT IN AERS: 1.0000 | INHALATION_SPRAY | Freq: Four times a day (QID) | RESPIRATORY_TRACT | Status: DC | PRN

## 2013-11-27 MED ORDER — ALBUTEROL SULFATE (2.5 MG/3ML) 0.083% IN NEBU
INHALATION_SOLUTION | RESPIRATORY_TRACT | Status: AC
  Filled 2013-11-27: qty 3

## 2013-11-27 MED ORDER — AEROCHAMBER Z-STAT PLUS/MEDIUM MISC
1.0000 | Freq: Once | Status: AC
  Administered 2013-11-27: 1

## 2013-11-27 MED ORDER — ALBUTEROL SULFATE (2.5 MG/3ML) 0.083% IN NEBU: 5.0000 mg | INHALATION_SOLUTION | Freq: Once | RESPIRATORY_TRACT | Status: DC

## 2013-11-27 MED ORDER — IPRATROPIUM-ALBUTEROL 0.5-2.5 (3) MG/3ML IN SOLN
RESPIRATORY_TRACT | Status: AC
  Administered 2013-11-27: 3 mL
  Filled 2013-11-27: qty 3

## 2013-11-27 MED ORDER — PREDNISONE 50 MG PO TABS
60.0000 mg | ORAL_TABLET | Freq: Once | ORAL | Status: AC
  Administered 2013-11-27: 60 mg via ORAL
  Filled 2013-11-27 (×2): qty 1

## 2013-11-27 MED ORDER — PREDNISONE 20 MG PO TABS: ORAL_TABLET | ORAL | Status: DC

## 2013-11-27 NOTE — ED Notes (Signed)
Pulse 92 and SATs 96% while walking.  tolerated well.

## 2013-11-27 NOTE — ED Provider Notes (Signed)
CSN: [id number]     Arrival date & time 11/27/13  1441 History   First MD Initiated Contact with Patient 11/27/13 1459     Chief Complaint  Patient presents with  . Cough     (Consider location/radiation/quality/duration/timing/severity/associated sxs/prior Treatment) HPI Patient reports she used to smoke. She quit almost 2 years ago. She has had coughing off and on however the last 3 days her cough has gotten worse. She also is coughing up a small amount of white sputum. She states however her concern is for the first time she has started having wheezing. She denies any fever. Patient states that she has tried to use inhalers or nebulizers in the past that she could "never used them". She however denies having wheezing before. She has a sore throat from coughing, she is a small amount of clear rhinorrhea. She denies nausea, vomiting, or diarrhea. She states she feels short of breath. She states her doctor told her if she quit smoking "you will never coughed again". She also states she was told her lungs were "90% normal".   PCP Dr Moshe Cipro  Past Medical History  Diagnosis Date  . ASCVD (arteriosclerotic cardiovascular disease)     No critical disease on cath in 8/97: mild AI with trival, if any l AS; negative stress nuclear in 2010  . Peripheral vascular disease     stauts post aortabifemoral graft    . Tobacco abuse     has stopped  . Chronic bronchitis   . Hypertension   . Fasting hyperglycemia   . Elevated hemoglobin A1c   . Hydronephrosis   . GERD (gastroesophageal reflux disease)   . Back pain   . Depression   . Osteoporosis   . Secondary erythrocytosis 08/05/2012    Secondary to COPD  . Cervical disc herniation   . Colon polyps    Past Surgical History  Procedure Laterality Date  . Cholecystectomy    . Aorta bifem graft    . Right kidney surgery following damage during arterial surgery    . Partial hysterectomy    . Total abdominal hysterectomy w/ bilateral  salpingoophorectomy  1975  . Lysis of adhesions  2000  . Abdominal hysterectomy    . Colonoscopy  06/23/2006    Dr. Fields:Normal colon without evidence of polyps, masses, inflammatory changes/Normal retroflex view of the rectum  . Colonoscopy N/A 08/24/2013    Procedure: COLONOSCOPY;  Surgeon: Danie Binder, MD;  Location: [facility];  Service: Endoscopy;  Laterality: N/A;  10:30-moved to [place] notified pt   Family History  Problem Relation Age of Onset  . Heart attack Mother   . Heart attack Father   . COPD Sister   . Diabetes Sister   . Diabetes Sister   . Coronary artery disease Sister   . Diabetes Sister   . Heart disease Sister   . COPD Sister   . Colon cancer Neg Hx    History  Substance Use Topics  . Smoking status: Former Smoker -- 0.50 packs/day    Types: Cigarettes  . Smokeless tobacco: Former Systems developer    Quit date: 05/12/2012  . Alcohol Use: No   Quit smoking  Lives with husband, no second hand smoke  OB History   Grav Para Term Preterm Abortions TAB SAB Ect Mult Living                 Review of Systems  All other systems reviewed and are negative.  Allergies  Review of patient's allergies indicates no known allergies.  Home Medications   Prior to Admission medications   Medication Sig Start Date End Date Taking? Authorizing Provider  ALPRAZolam Duanne Moron) 0.5 MG tablet Take 0.5 mg by mouth 2 (two) times daily.   Yes Historical Provider, MD  amLODipine (NORVASC) 5 MG tablet Take 5 mg by mouth daily.   Yes Historical Provider, MD  benazepril (LOTENSIN) 10 MG tablet Take 10 mg by mouth daily.   Yes Historical Provider, MD  fluticasone (FLONASE) 50 MCG/ACT nasal spray Place 2 sprays into both nostrils daily.   Yes Historical Provider, MD  HYDROcodone-acetaminophen (NORCO) 10-325 MG per tablet Take 1 tablet by mouth 3 (three) times daily.   Yes Historical Provider, MD  Linaclotide (LINZESS) 290 MCG CAPS capsule Take 290 mcg by mouth daily.   Yes  Historical Provider, MD  potassium chloride (K-DUR) 10 MEQ tablet Take 10 mEq by mouth daily.   Yes Historical Provider, MD  predniSONE (STERAPRED UNI-PAK) 5 MG TABS tablet Take 5-30 mg by mouth as directed. 6 day course as directed per package instructions (6,5,4,3,2,1) starting on 11/26/2013   Yes Historical Provider, MD  rosuvastatin (CRESTOR) 40 MG tablet Take 40 mg by mouth daily.   Yes Historical Provider, MD  terbinafine (LAMISIL) 250 MG tablet Take 250 mg by mouth daily.   Yes Historical Provider, MD  aspirin EC 81 MG tablet Take 81 mg by mouth daily.    Historical Provider, MD  CALCIUM PO Take 1 tablet by mouth daily.    Historical Provider, MD  Cholecalciferol (VITAMIN D) 2000 UNITS CAPS Take 1 capsule by mouth daily.    Historical Provider, MD  docusate sodium (CORRECTOL EXTRA GENTLE) 100 MG capsule Take 300 mg by mouth daily as needed for mild constipation or moderate constipation.    Historical Provider, MD  loratadine (CLARITIN) 10 MG tablet Take 1 tablet (10 mg total) by mouth daily. 10/20/13   Fayrene Helper, MD  Multiple Vitamin (MULTIVITAMIN WITH MINERALS) TABS Take 1 tablet by mouth every evening.     Historical Provider, MD  Omega-3 Fatty Acids (FISH OIL PO) Take 1 capsule by mouth daily.    Historical Provider, MD   BP 163/74  Pulse 91  Temp(Src) 97.6 F (36.4 C) (Oral)  Resp 18  Ht 5\' 2"  (1.575 m)  Wt 108 lb (48.988 kg)  BMI 19.75 kg/m2  SpO2 94%  Vital signs normal   Physical Exam  Nursing note and vitals reviewed. Constitutional: She is oriented to person, place, and time. She appears well-developed and well-nourished.  Non-toxic appearance. She does not appear ill. No distress.  HENT:  Head: Normocephalic and atraumatic.  Right Ear: External ear normal.  Left Ear: External ear normal.  Nose: Nose normal. No mucosal edema or rhinorrhea.  Mouth/Throat: Oropharynx is clear and moist and mucous membranes are normal. No dental abscesses or uvula swelling.  Eyes:  Conjunctivae and EOM are normal. Pupils are equal, round, and reactive to light.  Neck: Normal range of motion and full passive range of motion without pain. Neck supple.  Cardiovascular: Normal rate, regular rhythm and normal heart sounds.  Exam reveals no gallop and no friction rub.   No murmur heard. Pulmonary/Chest: Accessory muscle usage present. Tachypnea noted. No respiratory distress. She has decreased breath sounds. She has wheezes. She has no rhonchi. She has no rales. She exhibits no tenderness and no crepitus.  Audible wheezing at times, patient has diffuse wheezing in all  lung fields.  Abdominal: Soft. Normal appearance and bowel sounds are normal. She exhibits no distension. There is no tenderness. There is no rebound and no guarding.  Musculoskeletal: Normal range of motion. She exhibits no edema and no tenderness.  Moves all extremities well.   Neurological: She is alert and oriented to person, place, and time. She has normal strength. No cranial nerve deficit.  Skin: Skin is warm, dry and intact. No rash noted. No erythema. No pallor.  Psychiatric: She has a normal mood and affect. Her speech is normal and behavior is normal. Her mood appears not anxious.    ED Course  Procedures (including critical care time) Medications  ipratropium (ATROVENT) nebulizer solution 0.5 mg (not administered)  aerochamber Z-Stat Plus/medium 1 each (not administered)  ipratropium-albuterol (DUONEB) 0.5-2.5 (3) MG/3ML nebulizer solution (not administered)  albuterol (PROVENTIL) (2.5 MG/3ML) 0.083% nebulizer solution (not administered)  albuterol (PROVENTIL HFA;VENTOLIN HFA) 108 (90 BASE) MCG/ACT inhaler 2 puff (not administered)  predniSONE (DELTASONE) tablet 60 mg (60 mg Oral Given 11/27/13 1530)  ipratropium-albuterol (DUONEB) 0.5-2.5 (3) MG/3ML nebulizer solution 3 mL ( Nebulization Canceled Entry 11/27/13 1632)  albuterol (PROVENTIL) (2.5 MG/3ML) 0.083% nebulizer solution 2.5 mg (2.5 mg  Nebulization Given 11/27/13 1552)  ipratropium-albuterol (DUONEB) 0.5-2.5 (3) MG/3ML nebulizer solution (3 mLs  Given 11/27/13 1745)  albuterol (PROVENTIL) (2.5 MG/3ML) 0.083% nebulizer solution 5 mg (2.5 mg Nebulization Given 11/27/13 1745)   Recheck 15:55 Pt no longer has audible wheezing, however on lung exam she has clear upper lungs but diffuse lower pitched wheezing/rhonchi of both lung bases. Pt states she never could learn how to use an inhaler, will give a spacer to use. Will repeat her nebulizer.   Recheck 18:00 after second nebulizer, has rare rhonchi at bases. Nurses ambulated patient and her pulse ox remained 96 % on RA with pulse of 92. Pt was given a spacer to use with her inhaler, she states she may be able to use it.   Labs Review Results for orders placed during the hospital encounter of 11/27/13  CBC WITH DIFFERENTIAL      Result Value Ref Range   WBC 6.3  4.0 - 10.5 K/uL   RBC 4.24  3.87 - 5.11 MIL/uL   Hemoglobin 12.8  12.0 - 15.0 g/dL   HCT 38.3  36.0 - 46.0 %   MCV 90.3  78.0 - 100.0 fL   MCH 30.2  26.0 - 34.0 pg   MCHC 33.4  30.0 - 36.0 g/dL   RDW 13.8  11.5 - 15.5 %   Platelets 269  150 - 400 K/uL   Neutrophils Relative % 60  43 - 77 %   Neutro Abs 3.7  1.7 - 7.7 K/uL   Lymphocytes Relative 33  12 - 46 %   Lymphs Abs 2.1  0.7 - 4.0 K/uL   Monocytes Relative 7  3 - 12 %   Monocytes Absolute 0.4  0.1 - 1.0 K/uL   Eosinophils Relative 1  0 - 5 %   Eosinophils Absolute 0.0  0.0 - 0.7 K/uL   Basophils Relative 1  0 - 1 %   Basophils Absolute 0.0  0.0 - 0.1 K/uL  BASIC METABOLIC PANEL      Result Value Ref Range   Sodium 142  137 - 147 mEq/L   Potassium 3.6 (*) 3.7 - 5.3 mEq/L   Chloride 104  96 - 112 mEq/L   CO2 27  19 - 32 mEq/L   Glucose, Bld 154 (*)  70 - 99 mg/dL   BUN 15  6 - 23 mg/dL   Creatinine, Ser 1.10  0.50 - 1.10 mg/dL   Calcium 10.9 (*) 8.4 - 10.5 mg/dL   GFR calc non Af Amer 50 (*) >90 mL/min   GFR calc Af Amer 58 (*) >90 mL/min   Anion gap 11  5  - 15   Laboratory interpretation all normal except mild hypokalemia, mild hypercalcemia     Imaging Review Dg Chest 2 View  11/27/2013   CLINICAL DATA:  Wheezing cough for 2 days.  Ex-smoker.  EXAM: CHEST  2 VIEW  COMPARISON:  09/15/2012  FINDINGS: Hyperinflation. Midline trachea. Normal heart size with atherosclerosis in the transverse aorta. No pleural effusion or pneumothorax. Clear lungs.  IMPRESSION: Hyperinflation/COPD, without acute superimposed process.  Aortic atherosclerosis.   Electronically Signed   By: Abigail Miyamoto M.D.   On: 11/27/2013 16:06     EKG Interpretation None      MDM   Final diagnoses:  COPD exacerbation  Bronchitis    New Prescriptions   ALBUTEROL (PROVENTIL HFA;VENTOLIN HFA) 108 (90 BASE) MCG/ACT INHALER    Inhale 1-2 puffs into the lungs every 6 (six) hours as needed for wheezing or shortness of breath.   AZITHROMYCIN (ZITHROMAX Z-PAK) 250 MG TABLET    Take 2 po the first day then once a day for the next 4 days.   PREDNISONE (DELTASONE) 20 MG TABLET    Take 3 po QD x 2d starting tomorrow, September 6 then 2 po QD x 3d then 1 po QD x 3d    Plan discharge  Rolland Porter, MD, FACEP   Janice Norrie, MD 11/27/13 [phone]

## 2013-11-27 NOTE — Discharge Instructions (Signed)
Drink plenty of fluids. Use the inhaler with the spacer who were given today. Take the antibiotics and prednisone until gone. You can use mucinex DM OTC for cough or cough drops. Recheck if you get a fever, struggle to breathe or feel worse again.    Metered Dose Inhaler with Spacer Inhaled medicines are the basis of treatment of asthma and other breathing problems. Inhaled medicine can only be effective if used properly. Good technique assures that the medicine reaches the lungs. Your health care provider has asked you to use a spacer with your inhaler to help you take the medicine more effectively. A spacer is a plastic tube with a mouthpiece on one end and an opening that connects to the inhaler on the other end. Metered dose inhalers (MDIs) are used to deliver a variety of inhaled medicines. These include quick relief or rescue medicines (such as bronchodilators) and controller medicines (such as corticosteroids). The medicine is delivered by pushing down on a metal canister to release a set amount of spray. If you are using different kinds of inhalers, use your quick relief medicine to open the airways 10-15 minutes before using a steroid if instructed to do so by your health care provider. If you are unsure which inhalers to use and the order of using them, ask your health care provider, nurse, or respiratory therapist. HOW TO USE THE INHALER WITH A SPACER 1. Remove cap from inhaler. 2. If you are using the inhaler for the first time, you will need to prime it. Shake the inhaler for 5 seconds and release four puffs into the air, away from your face. Ask your health care provider or pharmacist if you have questions about priming your inhaler. 3. Shake inhaler for 5 seconds before each breath in (inhalation). 4. Place the open end of the spacer onto the mouthpiece of the inhaler. 5. Position the inhaler so that the top of the canister faces up and the spacer mouthpiece faces you. 6. Put your index  finger on the top of the medicine canister. Your thumb supports the bottom of the inhaler and the spacer. 7. Breathe out (exhale) normally and as completely as possible. 8. Immediately after exhaling, place the spacer between your teeth and into your mouth. Close your mouth tightly around the spacer. 9. Press the canister down with the index finger to release the medicine. 10. At the same time as the canister is pressed, inhale deeply and slowly until the lungs are completely filled. This should take 4-6 seconds. Keep your tongue down and out of the way. 11. Hold the medicine in your lungs for 5-10 seconds (10 seconds is best). This helps the medicine get into the small airways of your lungs. Exhale. 12. Repeat inhaling deeply through the spacer mouthpiece. Again hold that breath for up to 10 seconds (10 seconds is best). Exhale slowly. If it is difficult to take this second deep breath through the spacer, breathe normally several times through the spacer. Remove the spacer from your mouth. 13. Wait at least 15-30 seconds between puffs. Continue with the above steps until you have taken the number of puffs your health care provider has ordered. Do not use the inhaler more than your health care provider directs you to. 14. Remove spacer from the inhaler and place cap on inhaler. 15. Follow the directions from your health care provider or the inhaler insert for cleaning the inhaler and spacer. If you are using a steroid inhaler, rinse your mouth with water  after your last puff, gargle, and spit out the water. Do not swallow the water. AVOID:  Inhaling before or after starting the spray of medicine. It takes practice to coordinate your breathing with triggering the spray.  Inhaling through the nose (rather than the mouth) when triggering the spray. HOW TO DETERMINE IF YOUR INHALER IS FULL OR NEARLY EMPTY You cannot know when an inhaler is empty by shaking it. A few inhalers are now being made with dose  counters. Ask your health care provider for a prescription that has a dose counter if you feel you need that extra help. If your inhaler does not have a counter, ask your health care provider to help you determine the date you need to refill your inhaler. Write the refill date on a calendar or your inhaler canister. Refill your inhaler 7-10 days before it runs out. Be sure to keep an adequate supply of medicine. This includes making sure it is not expired, and you have a spare inhaler.  SEEK MEDICAL CARE IF:   Symptoms are only partially relieved with your inhaler.  You are having trouble using your inhaler.  You experience some increase in phlegm. SEEK IMMEDIATE MEDICAL CARE IF:   You feel little or no relief with your inhalers. You are still wheezing and are feeling shortness of breath or tightness in your chest or both.  You have dizziness, headaches, or fast heart rate.  You have chills, fever, or night sweats.  There is a noticeable increase in phlegm production, or there is blood in the phlegm. Document Released: 03/11/2005 Document Revised: 07/26/2013 Document Reviewed: 08/27/2012 [facility] Patient Information 2015 [place], [place]. This information is not intended to replace advice given to you by your health care provider. Make sure you discuss any questions you have with your health care provider.  Chronic Obstructive Pulmonary Disease Exacerbation Chronic obstructive pulmonary disease (COPD) is a common lung condition in which airflow from the lungs is limited. COPD is a general term that can be used to describe many different lung problems that limit airflow, including chronic bronchitis and emphysema. COPD exacerbations are episodes when breathing symptoms become much worse and require extra treatment. Without treatment, COPD exacerbations can be life threatening, and frequent COPD exacerbations can cause further damage to your lungs. CAUSES   Respiratory infections.   Exposure  to smoke.   Exposure to air pollution, chemical fumes, or dust. Sometimes there is no apparent cause or trigger. RISK FACTORS  Smoking cigarettes.  Older age.  Frequent prior COPD exacerbations. SIGNS AND SYMPTOMS   Increased coughing.   Increased thick spit (sputum) production.   Increased wheezing.   Increased shortness of breath.   Rapid breathing.   Chest tightness. DIAGNOSIS  Your medical history, a physical exam, and tests will help your health care provider make a diagnosis. Tests may include:  A chest X-ray.  Basic lab tests.  Sputum testing.  An arterial blood gas test. TREATMENT  Depending on the severity of your COPD exacerbation, you may need to be admitted to a hospital for treatment. Some of the treatments commonly used to treat COPD exacerbations are:   Antibiotic medicines.   Bronchodilators. These are drugs that expand the air passages. They may be given with an inhaler or nebulizer. Spacer devices may be needed to help improve drug delivery.  Corticosteroid medicines.  Supplemental oxygen therapy.  HOME CARE INSTRUCTIONS   Do not smoke. Quitting smoking is very important to prevent COPD from getting worse and exacerbations  from happening as often.  Avoid exposure to all substances that irritate the airway, especially to tobacco smoke.   If you were prescribed an antibiotic medicine, finish it all even if you start to feel better.  Take all medicines as directed by your health care provider.It is important to use correct technique with inhaled medicines.  Drink enough fluids to keep your urine clear or pale yellow (unless you have a medical condition that requires fluid restriction).  Use a cool mist vaporizer. This makes it easier to clear your chest when you cough.   If you have a home nebulizer and oxygen, continue to use them as directed.   Maintain all necessary vaccinations to prevent infections.   Exercise regularly.    Eat a healthy diet.   Keep all follow-up appointments as directed by your health care provider. SEEK IMMEDIATE MEDICAL CARE IF:  You have worsening shortness of breath.   You have trouble talking.   You have severe chest pain.  You have blood in your sputum.  You have a fever.  You have weakness, vomit repeatedly, or faint.   You feel confused.   You continue to get worse. MAKE SURE YOU:   Understand these instructions.  Will watch your condition.  Will get help right away if you are not doing well or get worse. Document Released: 01/06/2007 Document Revised: 07/26/2013 Document Reviewed: 11/13/2012 [facility] Patient Information 2015 [place], [place]. This information is not intended to replace advice given to you by your health care provider. Make sure you discuss any questions you have with your health care provider.

## 2013-11-27 NOTE — ED Notes (Signed)
Coughing for one week.  Having wheezing but no productive cough.

## 2013-11-27 NOTE — Progress Notes (Signed)
Patient instructed on use of Albuterol MDI with spacer.  She was able to demonstrate her understanding, with good effort and technique. Patient also requested instructions be written down for its use, RT obliged.

## 2013-11-27 NOTE — ED Notes (Signed)
Wheezing and coughing times 2 days.

## 2013-12-03 ENCOUNTER — Other Ambulatory Visit: Payer: Self-pay

## 2013-12-03 DIAGNOSIS — M5090 Cervical disc disorder, unspecified, unspecified cervical region: Secondary | ICD-10-CM

## 2013-12-03 MED ORDER — HYDROCODONE-ACETAMINOPHEN 10-325 MG PO TABS: ORAL_TABLET | ORAL | Status: DC

## 2013-12-22 ENCOUNTER — Emergency Department (HOSPITAL_COMMUNITY): Payer: Medicare Other

## 2013-12-22 ENCOUNTER — Emergency Department (HOSPITAL_COMMUNITY)
Admission: EM | Admit: 2013-12-22 | Discharge: 2013-12-22 | Disposition: A | Discharge status: Home / Self Care | Payer: Medicare Other | Attending: Emergency Medicine | Admitting: Emergency Medicine

## 2013-12-22 ENCOUNTER — Encounter (HOSPITAL_COMMUNITY): Payer: Self-pay | Admitting: Emergency Medicine

## 2013-12-22 DIAGNOSIS — IMO0002 Reserved for concepts with insufficient information to code with codable children: Secondary | ICD-10-CM | POA: Diagnosis not present

## 2013-12-22 DIAGNOSIS — F3289 Other specified depressive episodes: Secondary | ICD-10-CM | POA: Diagnosis not present

## 2013-12-22 DIAGNOSIS — Z87891 Personal history of nicotine dependence: Secondary | ICD-10-CM | POA: Diagnosis not present

## 2013-12-22 DIAGNOSIS — Z8739 Personal history of other diseases of the musculoskeletal system and connective tissue: Secondary | ICD-10-CM | POA: Diagnosis not present

## 2013-12-22 DIAGNOSIS — S60229A Contusion of unspecified hand, initial encounter: Secondary | ICD-10-CM | POA: Diagnosis not present

## 2013-12-22 DIAGNOSIS — Z87448 Personal history of other diseases of urinary system: Secondary | ICD-10-CM | POA: Diagnosis not present

## 2013-12-22 DIAGNOSIS — J449 Chronic obstructive pulmonary disease, unspecified: Secondary | ICD-10-CM | POA: Insufficient documentation

## 2013-12-22 DIAGNOSIS — Z23 Encounter for immunization: Secondary | ICD-10-CM | POA: Diagnosis not present

## 2013-12-22 DIAGNOSIS — S60222A Contusion of left hand, initial encounter: Secondary | ICD-10-CM

## 2013-12-22 DIAGNOSIS — J4489 Other specified chronic obstructive pulmonary disease: Secondary | ICD-10-CM | POA: Insufficient documentation

## 2013-12-22 DIAGNOSIS — Z860100 Personal history of colon polyps, unspecified: Secondary | ICD-10-CM | POA: Insufficient documentation

## 2013-12-22 DIAGNOSIS — Y92009 Unspecified place in unspecified non-institutional (private) residence as the place of occurrence of the external cause: Secondary | ICD-10-CM | POA: Diagnosis not present

## 2013-12-22 DIAGNOSIS — S61209A Unspecified open wound of unspecified finger without damage to nail, initial encounter: Secondary | ICD-10-CM | POA: Diagnosis not present

## 2013-12-22 DIAGNOSIS — Z792 Long term (current) use of antibiotics: Secondary | ICD-10-CM | POA: Insufficient documentation

## 2013-12-22 DIAGNOSIS — K219 Gastro-esophageal reflux disease without esophagitis: Secondary | ICD-10-CM | POA: Insufficient documentation

## 2013-12-22 DIAGNOSIS — I10 Essential (primary) hypertension: Secondary | ICD-10-CM | POA: Insufficient documentation

## 2013-12-22 DIAGNOSIS — Z79899 Other long term (current) drug therapy: Secondary | ICD-10-CM | POA: Diagnosis not present

## 2013-12-22 DIAGNOSIS — Y9389 Activity, other specified: Secondary | ICD-10-CM | POA: Diagnosis not present

## 2013-12-22 DIAGNOSIS — F329 Major depressive disorder, single episode, unspecified: Secondary | ICD-10-CM | POA: Insufficient documentation

## 2013-12-22 DIAGNOSIS — Z862 Personal history of diseases of the blood and blood-forming organs and certain disorders involving the immune mechanism: Secondary | ICD-10-CM | POA: Insufficient documentation

## 2013-12-22 DIAGNOSIS — R296 Repeated falls: Secondary | ICD-10-CM | POA: Insufficient documentation

## 2013-12-22 DIAGNOSIS — S61215A Laceration without foreign body of left ring finger without damage to nail, initial encounter: Secondary | ICD-10-CM

## 2013-12-22 MED ORDER — HYDROCODONE-ACETAMINOPHEN 5-325 MG PO TABS: 1.0000 | ORAL_TABLET | ORAL | Status: DC | PRN

## 2013-12-22 MED ORDER — TETANUS-DIPHTH-ACELL PERTUSSIS 5-2.5-18.5 LF-MCG/0.5 IM SUSP
0.5000 mL | Freq: Once | INTRAMUSCULAR | Status: AC
  Administered 2013-12-22: 0.5 mL via INTRAMUSCULAR
  Filled 2013-12-22: qty 0.5

## 2013-12-22 MED ORDER — LIDOCAINE HCL (PF) 1 % IJ SOLN
5.0000 mL | Freq: Once | INTRAMUSCULAR | Status: AC
  Administered 2013-12-22: 5 mL via INTRADERMAL
  Filled 2013-12-22: qty 5

## 2013-12-22 MED ORDER — HYDROCODONE-ACETAMINOPHEN 5-325 MG PO TABS
1.0000 | ORAL_TABLET | ORAL | Status: DC | PRN
Start: 2013-12-22 — End: 2014-02-13

## 2013-12-22 MED ORDER — IBUPROFEN 800 MG PO TABS: 800.0000 mg | ORAL_TABLET | Freq: Three times a day (TID) | ORAL | Status: DC | PRN

## 2013-12-22 NOTE — ED Notes (Signed)
Pt fell last night, lac to left ring finger with swelling, unable to get ring off, skin tear to left elbow; pt working on bushes outside yesterday evening and fell against porch railing, denies hitting her head

## 2013-12-22 NOTE — ED Provider Notes (Signed)
This chart was scribed for [place], DO by Lowella Petties, ED Scribe. The patient was seen in room APA19/APA19. Patient's care was started at 4:45 PM.  CHIEF COMPLAINT: Fall  HPI:  Carla [name] is a right hand dominant  68 y.o. female with a history of COPD, HTN, ASCVD, and peripheral vascular disease who presents to the Emergency Department after a fall last night. She reports that she was tired at night doing yard work when she lost her footing and fell forward.  When she tripped and caught herself against the railing her porch. She reports small abrasion to her left elbow, as well as constant swelling and sharp pain to her left ring finger. She was wearing rings at the time of the injury that she is concerned will be difficult to remove due to the swelling.She denies any other injuries or associated symptoms.  She denies any head injury or LOC. She is not on anticoagulation. She is unsure if her tetanus is UTD. Denies that she had any preceding chest pain, palpitations, lightheadedness that led to her fall.   ROS: See HPI Constitutional: no fever  Eyes: no drainage  ENT: no runny nose   Cardiovascular:  no chest pain  Resp: no SOB  GI: no vomiting GU: no dysuria Integumentary: no rash; left arm laceration Allergy: no hives  Musculoskeletal: no leg swelling  Neurological: no slurred speech ROS otherwise negative  PAST MEDICAL HISTORY/PAST SURGICAL HISTORY:  Past Medical History  Diagnosis Date  . ASCVD (arteriosclerotic cardiovascular disease)     No critical disease on cath in 8/97: mild AI with trival, if any l AS; negative stress nuclear in 2010  . Peripheral vascular disease     stauts post aortabifemoral graft    . Tobacco abuse     has stopped  . Chronic bronchitis   . Hypertension   . Fasting hyperglycemia   . Elevated hemoglobin A1c   . Hydronephrosis   . GERD (gastroesophageal reflux disease)   . Back pain   . Depression   . Osteoporosis   . Secondary  erythrocytosis 08/05/2012    Secondary to COPD  . Cervical disc herniation   . Colon polyps     MEDICATIONS:  Prior to Admission medications   Medication Sig Start Date End Date Taking? Authorizing Provider  albuterol (PROVENTIL HFA;VENTOLIN HFA) 108 (90 BASE) MCG/ACT inhaler Inhale 1-2 puffs into the lungs every 6 (six) hours as needed for wheezing or shortness of breath. 11/27/13   Janice Norrie, MD  ALPRAZolam Duanne Moron) 0.5 MG tablet Take 0.5 mg by mouth 2 (two) times daily.    Historical Provider, MD  amLODipine (NORVASC) 5 MG tablet Take 5 mg by mouth daily.    Historical Provider, MD  azithromycin (ZITHROMAX Z-PAK) 250 MG tablet Take 2 po the first day then once a day for the next 4 days. 11/27/13   Janice Norrie, MD  benazepril (LOTENSIN) 10 MG tablet Take 10 mg by mouth daily.    Historical Provider, MD  CALCIUM PO Take 1 tablet by mouth daily.    Historical Provider, MD  Cholecalciferol (VITAMIN D) 2000 UNITS CAPS Take 1 capsule by mouth daily.    Historical Provider, MD  HYDROcodone-acetaminophen (NORCO) 10-325 MG per tablet Take 1 tablet by mouth 3 (three) times daily.    Historical Provider, MD  HYDROcodone-acetaminophen [facility]) 10-325 MG per tablet One three times daily 12/03/13 01/03/14  Fayrene Helper, MD  Linaclotide [facility]) 290 MCG  CAPS capsule Take 290 mcg by mouth daily.    Historical Provider, MD  loratadine (CLARITIN) 10 MG tablet Take 1 tablet (10 mg total) by mouth daily. 10/20/13   Fayrene Helper, MD  Multiple Vitamin (MULTIVITAMIN WITH MINERALS) TABS Take 1 tablet by mouth every evening.     Historical Provider, MD  Omega-3 Fatty Acids (FISH OIL PO) Take 1 capsule by mouth daily.    Historical Provider, MD  potassium chloride (K-DUR) 10 MEQ tablet Take 10 mEq by mouth daily.    Historical Provider, MD  predniSONE (DELTASONE) 20 MG tablet Take 3 po QD x 2d starting tomorrow, September 6 then 2 po QD x 3d then 1 po QD x 3d 11/27/13   Janice Norrie, MD  predniSONE (STERAPRED  UNI-PAK) 5 MG TABS tablet Take 5-30 mg by mouth as directed. 6 day course as directed per package instructions [phone]) starting on 11/26/2013    Historical Provider, MD  rosuvastatin (CRESTOR) 40 MG tablet Take 40 mg by mouth daily.    Historical Provider, MD    ALLERGIES:  No Known Allergies  SOCIAL HISTORY:  History  Substance Use Topics  . Smoking status: Former Smoker -- 0.50 packs/day    Types: Cigarettes  . Smokeless tobacco: Former Systems developer    Quit date: 05/12/2012  . Alcohol Use: No    FAMILY HISTORY: Family History  Problem Relation Age of Onset  . Heart attack Mother   . Heart attack Father   . COPD Sister   . Diabetes Sister   . Diabetes Sister   . Coronary artery disease Sister   . Diabetes Sister   . Heart disease Sister   . COPD Sister   . Colon cancer Neg Hx     EXAM: Triage Vitals: BP 139/105  Pulse 89  Temp(Src) 98.1 F (36.7 C) (Oral)  Resp 18  Ht 5\' 3"  (1.6 m)  Wt 129 lb (58.514 kg)  BMI 22.86 kg/m2  SpO2 99% CONSTITUTIONAL: Alert and oriented and responds appropriately to questions. Well-appearing; well-nourished; GCS 15 HEAD: Normocephalic; atraumatic EYES: Conjunctivae clear, PERRL, EOMI ENT: normal nose; no rhinorrhea; moist mucous membranes; pharynx without lesions noted; no dental injury; no septal hematoma NECK: Supple, no meningismus, no LAD; no midline spinal tenderness, step-off or deformity CARD: RRR; S1 and S2 appreciated; no murmurs, no clicks, no rubs, no gallops RESP: Normal chest excursion without splinting or tachypnea; breath sounds clear and equal bilaterally; no wheezes, no rhonchi, no rales; chest wall stable, nontender to palpation ABD/GI: Normal bowel sounds; non-distended; soft, non-tender, no rebound, no guarding PELVIS:  stable, nontender to palpation BACK:  The back appears normal and is non-tender to palpation, there is no CVA tenderness; no midline spinal tenderness, step-off or deformity EXT: Normal ROM in all  joints; non-tender to palpation; no edema; normal capillary refill; no cyanosis    SKIN: Normal color for age and race; warm; 1CM skin tear to the left dorsal proximal forearm; 1 CM skin flap just proximal to the left fourth PIP with associated ecchymosis and swelling, unable to remove the rings on her left fourth finger secondary to swelling she has normal capillary refill and sensation distally, 2+ radial pulses bilaterally, normal grip strength, patient is has bony tenderness over the left fourth digit NEURO: Moves all extremities equally, sensation to light touch intact diffusely, cranial nerves II through XII intact, normal gait PSYCH: The patient's mood and manner are appropriate. Grooming and personal hygiene are appropriate.  MEDICAL DECISION MAKING:  Patient here with 2 small lacerations that occurred last night, swelling of the fourth left digit, unable to remove her readings secondary swelling. Had patient elevate her hand, apply ice and compression and then used a digital block to remove her ring successfully. X-ray shows no acute fracture of the left fourth digit. Discussed with patient that I cannot repair the laceration on her hand because his been greater than 12 hours ago. We'll have her clean this area with warm soap and water at home and use over-the-counter Neosporin. We'll discharge with Vicodin for pain control. Discussed return precautions. Tetanus has been updated. Patient verbalizes understanding and is comfortable with plan.       [place], DO 12/22/13 1913

## 2013-12-22 NOTE — ED Notes (Signed)
Wrapped finger left ring finger with coban tightly and raised above the head. Placed ice pack on finger as ordered by Dr. Leonides Schanz. Patient tolerated well.

## 2013-12-22 NOTE — Discharge Instructions (Signed)
Contusion °A contusion is a deep bruise. Contusions are the result of an injury that caused bleeding under the skin. The contusion may turn blue, purple, or yellow. Minor injuries will give you a painless contusion, but more severe contusions may stay painful and swollen for a few weeks.  °CAUSES  °A contusion is usually caused by a blow, trauma, or direct force to an area of the body. °SYMPTOMS  °· Swelling and redness of the injured area. °· Bruising of the injured area. °· Tenderness and soreness of the injured area. °· Pain. °DIAGNOSIS  °The diagnosis can be made by taking a history and physical exam. An X-ray, CT scan, or MRI may be needed to determine if there were any associated injuries, such as fractures. °TREATMENT  °Specific treatment will depend on what area of the body was injured. In general, the best treatment for a contusion is resting, icing, elevating, and applying cold compresses to the injured area. Over-the-counter medicines may also be recommended for pain control. Ask your caregiver what the best treatment is for your contusion. °HOME CARE INSTRUCTIONS  °· Put ice on the injured area. °· Put ice in a plastic bag. °· Place a towel between your skin and the bag. °· Leave the ice on for 15-20 minutes, 3-4 times a day, or as directed by your health care provider. °· Only take over-the-counter or prescription medicines for pain, discomfort, or fever as directed by your caregiver. Your caregiver may recommend avoiding anti-inflammatory medicines (aspirin, ibuprofen, and naproxen) for 48 hours because these medicines may increase bruising. °· Rest the injured area. °· If possible, elevate the injured area to reduce swelling. °SEEK IMMEDIATE MEDICAL CARE IF:  °· You have increased bruising or swelling. °· You have pain that is getting worse. °· Your swelling or pain is not relieved with medicines. °MAKE SURE YOU:  °· Understand these instructions. °· Will watch your condition. °· Will get help right  away if you are not doing well or get worse. °Document Released: 12/19/2004 Document Revised: 03/16/2013 Document Reviewed: 01/14/2011 °ExitCare® Patient Information ©2015 [employer]. This information is not intended to replace advice given to you by your health care provider. Make sure you discuss any questions you have with your health care provider. ° °Laceration Care, Adult °A laceration is a cut or lesion that goes through all layers of the skin and into the tissue just beneath the skin. °TREATMENT  °Some lacerations may not require closure. Some lacerations may not be able to be closed due to an increased risk of infection. It is important to see your caregiver as soon as possible after an injury to minimize the risk of infection and maximize the opportunity for successful closure. °If closure is appropriate, pain medicines may be given, if needed. The wound will be cleaned to help prevent infection. Your caregiver will use stitches (sutures), staples, wound glue (adhesive), or skin adhesive strips to repair the laceration. These tools bring the skin edges together to allow for faster healing and a better cosmetic outcome. However, all wounds will heal with a scar. Once the wound has healed, scarring can be minimized by covering the wound with sunscreen during the day for 1 full year. °HOME CARE INSTRUCTIONS  °For sutures or staples: °· Keep the wound clean and dry. °· If you were given a bandage (dressing), you should change it at least once a day. Also, change the dressing if it becomes wet or dirty, or as directed by your caregiver. °·   Wash the wound with soap and water 2 times a day. Rinse the wound off with water to remove all soap. Pat the wound dry with a clean towel. °· After cleaning, apply a thin layer of the antibiotic ointment as recommended by your caregiver. This will help prevent infection and keep the dressing from sticking. °· You may shower as usual after the first 24 hours. Do not soak  the wound in water until the sutures are removed. °· Only take over-the-counter or prescription medicines for pain, discomfort, or fever as directed by your caregiver. °· Get your sutures or staples removed as directed by your caregiver. °For skin adhesive strips: °· Keep the wound clean and dry. °· Do not get the skin adhesive strips wet. You may bathe carefully, using caution to keep the wound dry. °· If the wound gets wet, pat it dry with a clean towel. °· Skin adhesive strips will fall off on their own. You may trim the strips as the wound heals. Do not remove skin adhesive strips that are still stuck to the wound. They will fall off in time. °For wound adhesive: °· You may briefly wet your wound in the shower or bath. Do not soak or scrub the wound. Do not swim. Avoid periods of heavy perspiration until the skin adhesive has fallen off on its own. After showering or bathing, gently pat the wound dry with a clean towel. °· Do not apply liquid medicine, cream medicine, or ointment medicine to your wound while the skin adhesive is in place. This may loosen the film before your wound is healed. °· If a dressing is placed over the wound, be careful not to apply tape directly over the skin adhesive. This may cause the adhesive to be pulled off before the wound is healed. °· Avoid prolonged exposure to sunlight or tanning lamps while the skin adhesive is in place. Exposure to ultraviolet light in the first year will darken the scar. °· The skin adhesive will usually remain in place for 5 to 10 days, then naturally fall off the skin. Do not pick at the adhesive film. °You may need a tetanus shot if: °· You cannot remember when you had your last tetanus shot. °· You have never had a tetanus shot. °If you get a tetanus shot, your arm may swell, get red, and feel warm to the touch. This is common and not a problem. If you need a tetanus shot and you choose not to have one, there is a rare chance of getting tetanus.  Sickness from tetanus can be serious. °SEEK MEDICAL CARE IF:  °· You have redness, swelling, or increasing pain in the wound. °· You see a red line that goes away from the wound. °· You have yellowish-white fluid (pus) coming from the wound. °· You have a fever. °· You notice a bad smell coming from the wound or dressing. °· Your wound breaks open before or after sutures have been removed. °· You notice something coming out of the wound such as wood or glass. °· Your wound is on your hand or foot and you cannot move a finger or toe. °SEEK IMMEDIATE MEDICAL CARE IF:  °· Your pain is not controlled with prescribed medicine. °· You have severe swelling around the wound causing pain and numbness or a change in color in your arm, hand, leg, or foot. °· Your wound splits open and starts bleeding. °· You have worsening numbness, weakness, or loss of function of any   joint around or beyond the wound. °· You develop painful lumps near the wound or on the skin anywhere on your body. °MAKE SURE YOU:  °· Understand these instructions. °· Will watch your condition. °· Will get help right away if you are not doing well or get worse. °Document Released: 03/11/2005 Document Revised: 06/03/2011 Document Reviewed: 09/04/2010 °ExitCare® Patient Information ©2015 [employer]. This information is not intended to replace advice given to you by your health care provider. Make sure you discuss any questions you have with your health care provider. ° ° °RICE: Routine Care for Injuries °The routine care of many injuries includes Rest, Ice, Compression, and Elevation (RICE). °HOME CARE INSTRUCTIONS °· Rest is needed to allow your body to heal. Routine activities can usually be resumed when comfortable. Injured tendons and bones can take up to 6 weeks to heal. Tendons are the cord-like structures that attach muscle to bone. °· Ice following an injury helps keep the swelling down and reduces pain. °¨ Put ice in a plastic bag. °¨ Place a towel  between your skin and the bag. °¨ Leave the ice on for 15-20 minutes, 3-4 times a day, or as directed by your health care provider. Do this while awake, for the first 24 to 48 hours. After that, continue as directed by your caregiver. °· Compression helps keep swelling down. It also gives support and helps with discomfort. If an elastic bandage has been applied, it should be removed and reapplied every 3 to 4 hours. It should not be applied tightly, but firmly enough to keep swelling down. Watch fingers or toes for swelling, bluish discoloration, coldness, numbness, or excessive pain. If any of these problems occur, remove the bandage and reapply loosely. Contact your caregiver if these problems continue. °· Elevation helps reduce swelling and decreases pain. With extremities, such as the arms, hands, legs, and feet, the injured area should be placed near or above the level of the heart, if possible. °SEEK IMMEDIATE MEDICAL CARE IF: °· You have persistent pain and swelling. °· You develop redness, numbness, or unexpected weakness. °· Your symptoms are getting worse rather than improving after several days. °These symptoms may indicate that further evaluation or further X-rays are needed. Sometimes, X-rays may not show a small broken bone (fracture) until 1 week or 10 days later. Make a follow-up appointment with your caregiver. Ask when your X-ray results will be ready. Make sure you get your X-ray results. °Document Released: 06/23/2000 Document Revised: 03/16/2013 Document Reviewed: 08/10/2010 °ExitCare® Patient Information ©2015 [employer]. This information is not intended to replace advice given to you by your health care provider. Make sure you discuss any questions you have with your health care provider. ° °

## 2014-01-03 MED FILL — Hydrocodone-Acetaminophen Tab 5-325 MG: ORAL | Qty: 6 | Status: AC

## 2014-01-10 ENCOUNTER — Other Ambulatory Visit: Payer: Self-pay

## 2014-01-10 DIAGNOSIS — M5090 Cervical disc disorder, unspecified, unspecified cervical region: Secondary | ICD-10-CM

## 2014-01-10 MED ORDER — HYDROCODONE-ACETAMINOPHEN 10-325 MG PO TABS: ORAL_TABLET | ORAL | Status: DC

## 2014-01-19 ENCOUNTER — Telehealth: Payer: Self-pay | Admitting: *Deleted

## 2014-01-19 NOTE — Telephone Encounter (Signed)
Left shoulder pain due to herniated disc. Been hurting for 3 weeks now. Wants a muscle relaxer sent in to her pharmacy

## 2014-01-19 NOTE — Telephone Encounter (Signed)
Pt called stating she needs a muscle relaxer the pain pills aren't doing any good. Please advise [phone].

## 2014-01-20 ENCOUNTER — Other Ambulatory Visit: Payer: Self-pay

## 2014-01-20 MED ORDER — CYCLOBENZAPRINE HCL 10 MG PO TABS: 10.0000 mg | ORAL_TABLET | Freq: Every day | ORAL | Status: DC

## 2014-01-20 MED ORDER — GABAPENTIN 300 MG PO CAPS: 300.0000 mg | ORAL_CAPSULE | Freq: Every day | ORAL | Status: DC

## 2014-01-20 NOTE — Telephone Encounter (Addendum)
Pls Advise and send in gabapentin 300mg  one at bedtime for uncontrolled nerve pain,#30 refill 2 This is for pain, if she has muscle spasm, then short term muscle relaxer flexeril 10mg  one at bedtime 310 only.Ensure her visits are not past due also pls

## 2014-01-25 ENCOUNTER — Encounter: Payer: Self-pay | Admitting: *Deleted

## 2014-01-25 ENCOUNTER — Encounter: Payer: Medicare Other | Admitting: Family Medicine

## 2014-02-03 ENCOUNTER — Other Ambulatory Visit: Payer: Self-pay

## 2014-02-03 ENCOUNTER — Other Ambulatory Visit: Payer: Self-pay | Admitting: Family Medicine

## 2014-02-03 DIAGNOSIS — M5090 Cervical disc disorder, unspecified, unspecified cervical region: Secondary | ICD-10-CM

## 2014-02-03 MED ORDER — HYDROCODONE-ACETAMINOPHEN 10-325 MG PO TABS: ORAL_TABLET | ORAL | Status: DC

## 2014-02-07 ENCOUNTER — Telehealth: Payer: Self-pay

## 2014-02-07 NOTE — Telephone Encounter (Signed)
Patient aware.

## 2014-02-07 NOTE — Telephone Encounter (Signed)
Has appt on 11/19 will discuss then

## 2014-02-07 NOTE — Telephone Encounter (Signed)
Cannot afford the crestor. Is $110 this month- had been being $30. Needs it changed to something else. Please advise

## 2014-02-10 ENCOUNTER — Ambulatory Visit (INDEPENDENT_AMBULATORY_CARE_PROVIDER_SITE_OTHER): Payer: Medicare Other

## 2014-02-10 ENCOUNTER — Encounter: Payer: Self-pay | Admitting: Family Medicine

## 2014-02-10 ENCOUNTER — Ambulatory Visit (INDEPENDENT_AMBULATORY_CARE_PROVIDER_SITE_OTHER): Payer: Medicare Other | Admitting: Family Medicine

## 2014-02-10 VITALS — BP 144/80 | HR 84 | Resp 16 | Ht 62.0 in | Wt 137.1 lb

## 2014-02-10 DIAGNOSIS — Z23 Encounter for immunization: Secondary | ICD-10-CM

## 2014-02-10 DIAGNOSIS — E1151 Type 2 diabetes mellitus with diabetic peripheral angiopathy without gangrene: Secondary | ICD-10-CM

## 2014-02-10 DIAGNOSIS — M549 Dorsalgia, unspecified: Secondary | ICD-10-CM

## 2014-02-10 DIAGNOSIS — I10 Essential (primary) hypertension: Secondary | ICD-10-CM

## 2014-02-10 DIAGNOSIS — E1159 Type 2 diabetes mellitus with other circulatory complications: Secondary | ICD-10-CM

## 2014-02-10 DIAGNOSIS — E785 Hyperlipidemia, unspecified: Secondary | ICD-10-CM

## 2014-02-10 DIAGNOSIS — F411 Generalized anxiety disorder: Secondary | ICD-10-CM

## 2014-02-10 MED ORDER — ATORVASTATIN CALCIUM 40 MG PO TABS: 40.0000 mg | ORAL_TABLET | Freq: Every day | ORAL | Status: DC

## 2014-02-10 NOTE — Progress Notes (Signed)
   Subjective:    Patient ID: Carla [name], female    DOB: 01-11-1946, 68 y.o.   MRN: [id number]  HPI The PT is here for follow up and re-evaluation of chronic medical conditions, medication management and review of any available recent lab and radiology data.  Preventive health is updated, specifically  Cancer screening and Immunization.  Still refuses full exam, accepts flu vaccine The PT denies any adverse reactions to current medications since the last visit.  There are no new concerns.  There are no specific complaints       Review of Systems See HPI Denies recent fever or chills. Denies sinus pressure, nasal congestion, ear pain or sore throat. Denies chest congestion, productive cough or wheezing. Denies chest pains, palpitations and leg swelling Denies abdominal pain, nausea, vomiting,diarrhea or constipation.   Denies dysuria, frequency, hesitancy or incontinence. C/o chronic and mildly increased joint pain, espescially low back Denies headaches, seizures, numbness, or tingling. Denies depression, uncontrolled  anxiety or insomnia. Denies skin break down or rash.        Objective:   Physical Exam BP 144/80 mmHg  Pulse 84  Resp 16  Ht 5\' 2"  (1.575 m)  Wt 137 lb 1.9 oz (62.197 kg)  BMI 25.07 kg/m2  SpO2 96% Patient alert and oriented and in no cardiopulmonary distress.  HEENT: No facial asymmetry, EOMI,   oropharynx pink and moist.  Neck supple no JVD, no mass.  Chest: Clear to auscultation bilaterally.Decreased thoughdequate air entry  CVS: S1, S2 no murmurs, no S3.Regular rate.  ABD: Soft non tender.   Ext: No edema  MS: Adequate ROM spine, shoulders, hips and knees.  Skin: Intact, no ulcerations or rash noted.  Psych: Good eye contact, normal affect. Memory intact not anxious or depressed appearing.  CNS: CN 2-12 intact, power,  normal throughout.no focal deficits noted.        Assessment & Plan:  Essential hypertension Not at goal DASH  diet and commitment to daily physical activity for a minimum of 30 minutes discussed and encouraged, as a part of hypertension management. The importance of attaining a healthy weight is also discussed.  No med change today  Well controlled type 2 diabetes mellitus with peripheral circulatory disorder Has bene off medication, has started to increase ice cream intake , counseled against this Updated lab needed  Still needs eye exam, states she will arrange to have this done   Back pain with radiation Unchanged, despite attempt to get increased paiun  Medication, pt will remain on current dose of medication  Hyperlipidemia LDL goal <100 Updated lab needed at/ before next visit. Hyperlipidemia:Low fat diet discussed and encouraged.  Elevatted TG when last checked, unfortunately based on diet and weight gain I suspect this has worsened  Need for prophylactic vaccination and inoculation against influenza Vaccine administered at visit.   GAD (generalized anxiety disorder) Improved and stable on current medication , continue same

## 2014-02-10 NOTE — Patient Instructions (Addendum)
Annual wellness in mid to end January, call if you need me before  Flu vaccine today  New for choletstrol is lipitor 40 mg daily in place of crestor due to cost  Reduce Nutty Buddy to ONE at night, sometimes none at night, instead and apple!    CONGRATS on remaining nicotine  Free   Fasting lipid cmp and Egfr, HBA1ac, TSH before next visit, 5 days approx

## 2014-02-11 ENCOUNTER — Other Ambulatory Visit: Payer: Self-pay | Admitting: Family Medicine

## 2014-02-11 ENCOUNTER — Other Ambulatory Visit: Payer: Self-pay

## 2014-02-11 MED ORDER — ALPRAZOLAM 0.5 MG PO TABS: 0.5000 mg | ORAL_TABLET | Freq: Two times a day (BID) | ORAL | Status: DC

## 2014-02-13 DIAGNOSIS — Z23 Encounter for immunization: Secondary | ICD-10-CM | POA: Insufficient documentation

## 2014-02-13 DIAGNOSIS — F411 Generalized anxiety disorder: Secondary | ICD-10-CM | POA: Insufficient documentation

## 2014-02-13 NOTE — Assessment & Plan Note (Signed)
Vaccine administered at visit.  

## 2014-02-13 NOTE — Assessment & Plan Note (Signed)
Updated lab needed at/ before next visit. Hyperlipidemia:Low fat diet discussed and encouraged.  Elevatted TG when last checked, unfortunately based on diet and weight gain I suspect this has worsened

## 2014-02-13 NOTE — Assessment & Plan Note (Signed)
Not at goal DASH diet and commitment to daily physical activity for a minimum of 30 minutes discussed and encouraged, as a part of hypertension management. The importance of attaining a healthy weight is also discussed.  No med change today

## 2014-02-13 NOTE — Assessment & Plan Note (Signed)
Unchanged, despite attempt to get increased paiun  Medication, pt will remain on current dose of medication

## 2014-02-13 NOTE — Assessment & Plan Note (Addendum)
Has bene off medication, has started to increase ice cream intake , counseled against this Updated lab needed  Still needs eye exam, states she will arrange to have this done

## 2014-02-13 NOTE — Assessment & Plan Note (Signed)
Improved and stable on current medication , continue same

## 2014-02-22 ENCOUNTER — Ambulatory Visit: Payer: Medicare Other | Admitting: Family Medicine

## 2014-03-04 ENCOUNTER — Other Ambulatory Visit: Payer: Self-pay

## 2014-03-04 DIAGNOSIS — M5090 Cervical disc disorder, unspecified, unspecified cervical region: Secondary | ICD-10-CM

## 2014-03-04 MED ORDER — HYDROCODONE-ACETAMINOPHEN 10-325 MG PO TABS: ORAL_TABLET | ORAL | Status: DC

## 2014-03-19 ENCOUNTER — Other Ambulatory Visit: Payer: Self-pay | Admitting: Family Medicine

## 2014-03-23 ENCOUNTER — Emergency Department (HOSPITAL_COMMUNITY)
Admission: EM | Admit: 2014-03-23 | Discharge: 2014-03-23 | Disposition: A | Discharge status: Home / Self Care | Payer: Medicare Other | Attending: Emergency Medicine | Admitting: Emergency Medicine

## 2014-03-23 ENCOUNTER — Encounter (HOSPITAL_COMMUNITY): Payer: Self-pay | Admitting: Emergency Medicine

## 2014-03-23 DIAGNOSIS — F329 Major depressive disorder, single episode, unspecified: Secondary | ICD-10-CM | POA: Insufficient documentation

## 2014-03-23 DIAGNOSIS — Z862 Personal history of diseases of the blood and blood-forming organs and certain disorders involving the immune mechanism: Secondary | ICD-10-CM | POA: Diagnosis not present

## 2014-03-23 DIAGNOSIS — Z87448 Personal history of other diseases of urinary system: Secondary | ICD-10-CM | POA: Insufficient documentation

## 2014-03-23 DIAGNOSIS — Z87891 Personal history of nicotine dependence: Secondary | ICD-10-CM | POA: Insufficient documentation

## 2014-03-23 DIAGNOSIS — M62838 Other muscle spasm: Secondary | ICD-10-CM | POA: Diagnosis not present

## 2014-03-23 DIAGNOSIS — Z86010 Personal history of colonic polyps: Secondary | ICD-10-CM | POA: Insufficient documentation

## 2014-03-23 DIAGNOSIS — Z8709 Personal history of other diseases of the respiratory system: Secondary | ICD-10-CM | POA: Diagnosis not present

## 2014-03-23 DIAGNOSIS — M542 Cervicalgia: Secondary | ICD-10-CM | POA: Diagnosis present

## 2014-03-23 DIAGNOSIS — Z79899 Other long term (current) drug therapy: Secondary | ICD-10-CM | POA: Diagnosis not present

## 2014-03-23 MED ORDER — DIAZEPAM 5 MG PO TABS: 5.0000 mg | ORAL_TABLET | Freq: Four times a day (QID) | ORAL | Status: DC | PRN

## 2014-03-23 NOTE — Discharge Instructions (Signed)

## 2014-03-23 NOTE — ED Provider Notes (Signed)
CSN: [id number]     Arrival date & time 03/23/14  1543 History   First MD Initiated Contact with Patient 03/23/14 1604     Chief Complaint  Patient presents with  . Neck Pain     (Consider location/radiation/quality/duration/timing/severity/associated sxs/prior Treatment) Patient is a 68 y.o. female presenting with neck pain. The history is provided by the patient.  Neck Pain Associated symptoms: no chest pain, no fever, no numbness and no weakness    patient with pain in her right neck that began yesterday. It comes and goes. States was a swelling in the back of her neck happened. Pain starts at the back of her head and works its way down her neck. No numbness weakness. No confusion. She has a dull headache with it. No trauma. Husband states he thought it was a "crick" in her neck. No episodes like this before. She does have some cervical disc problems. There is redness on her neck. She states she did have a heating blanket on it but does not think she burned it. No fevers.  Past Medical History  Diagnosis Date  . ASCVD (arteriosclerotic cardiovascular disease)     No critical disease on cath in 8/97: mild AI with trival, if any l AS; negative stress nuclear in 2010  . Peripheral vascular disease     stauts post aortabifemoral graft    . Tobacco abuse     has stopped  . Chronic bronchitis   . Hypertension   . Fasting hyperglycemia   . Elevated hemoglobin A1c   . Hydronephrosis   . GERD (gastroesophageal reflux disease)   . Back pain   . Depression   . Osteoporosis   . Secondary erythrocytosis 08/05/2012    Secondary to COPD  . Cervical disc herniation   . Colon polyps    Past Surgical History  Procedure Laterality Date  . Cholecystectomy    . Aorta bifem graft    . Right kidney surgery following damage during arterial surgery    . Partial hysterectomy    . Total abdominal hysterectomy w/ bilateral salpingoophorectomy  1975  . Lysis of adhesions  2000  . Abdominal  hysterectomy    . Colonoscopy  06/23/2006    Dr. Fields:Normal colon without evidence of polyps, masses, inflammatory changes/Normal retroflex view of the rectum  . Colonoscopy N/A 08/24/2013    Procedure: COLONOSCOPY;  Surgeon: Danie Binder, MD;  Location: [facility];  Service: Endoscopy;  Laterality: N/A;  10:30-moved to [place] notified pt   Family History  Problem Relation Age of Onset  . Heart attack Mother   . Heart attack Father   . COPD Sister   . Diabetes Sister   . Diabetes Sister   . Coronary artery disease Sister   . Diabetes Sister   . Heart disease Sister   . COPD Sister   . Colon cancer Neg Hx    History  Substance Use Topics  . Smoking status: Former Smoker -- 0.50 packs/day    Types: Cigarettes  . Smokeless tobacco: Former Systems developer    Quit date: 05/12/2012  . Alcohol Use: No   OB History    No data available     Review of Systems  Constitutional: Negative for fever and activity change.  Respiratory: Negative for shortness of breath.   Cardiovascular: Negative for chest pain.  Gastrointestinal: Negative for abdominal pain.  Musculoskeletal: Positive for neck pain. Negative for back pain.  Skin: Positive for rash.  Neurological: Negative  for weakness and numbness.      Allergies  Review of patient's allergies indicates no known allergies.  Home Medications   Prior to Admission medications   Medication Sig Start Date End Date Taking? Authorizing Provider  albuterol (PROVENTIL HFA;VENTOLIN HFA) 108 (90 BASE) MCG/ACT inhaler Inhale 1-2 puffs into the lungs every 6 (six) hours as needed for wheezing or shortness of breath. 11/27/13   Janice Norrie, MD  ALPRAZolam Duanne Moron) 0.5 MG tablet Take 1 tablet (0.5 mg total) by mouth 2 (two) times daily. 02/11/14   Fayrene Helper, MD  amLODipine (NORVASC) 5 MG tablet TAKE (1) TABLET BY MOUTH DAILY. 03/21/14   Fayrene Helper, MD  atorvastatin (LIPITOR) 40 MG tablet Take 1 tablet (40 mg total) by mouth  daily. 02/10/14   Fayrene Helper, MD  benazepril (LOTENSIN) 10 MG tablet Take 10 mg by mouth daily.    Historical Provider, MD  CALCIUM PO Take 1 tablet by mouth daily.    Historical Provider, MD  Cholecalciferol (VITAMIN D) 2000 UNITS CAPS Take 1 capsule by mouth daily.    Historical Provider, MD  diazepam (VALIUM) 5 MG tablet Take 1 tablet (5 mg total) by mouth every 6 (six) hours as needed for muscle spasms. 03/23/14   Jasper Riling. Nadina Fomby, MD  gabapentin (NEURONTIN) 300 MG capsule Take 1 capsule (300 mg total) by mouth at bedtime. 01/20/14   Fayrene Helper, MD  HYDROcodone-acetaminophen [facility]) 10-325 MG per tablet One three times daily 03/04/14 04/04/14  Fayrene Helper, MD  Linaclotide [facility]) 290 MCG CAPS capsule Take 290 mcg by mouth daily.    Historical Provider, MD  loratadine (CLARITIN) 10 MG tablet Take 1 tablet (10 mg total) by mouth daily. 10/20/13   Fayrene Helper, MD  Multiple Vitamin (MULTIVITAMIN WITH MINERALS) TABS Take 1 tablet by mouth every evening.     Historical Provider, MD  Omega-3 Fatty Acids (FISH OIL PO) Take 1 capsule by mouth daily.    Historical Provider, MD  potassium chloride (K-DUR) 10 MEQ tablet Take 10 mEq by mouth daily.    Historical Provider, MD   BP 167/80 mmHg  Pulse 96  Temp(Src) 98.2 F (36.8 C) (Oral)  Resp 18  Ht 5\' 2"  (1.575 m)  Wt 139 lb (63.05 kg)  BMI 25.42 kg/m2  SpO2 97% Physical Exam  Constitutional: She appears well-developed.  Neck:  Some tenderness over spinal musculature on right side medially on her neck. There is a rash on her lateral neck going down. It is red without raising. No induration. Pulse intact in right wrist.  Cardiovascular: Normal rate.   Pulmonary/Chest: Effort normal.  Musculoskeletal: She exhibits tenderness.  Neurological: She is alert.    ED Course  Procedures (including critical care time) Labs Review Labs Reviewed - No data to display  Imaging Review No results found.   EKG  Interpretation None      MDM   Final diagnoses:  Muscle spasms of neck    Pain in right neck. Musculoskeletal. Doubt vascular cause, although patient is worried that it is a plugged of vessel. Rashes of unknown significance. Does not appear to be cellulitic or zoster. Will discharge home with muscle relaxer.    Jasper Riling. Alvino Chapel, MD 03/23/14 [phone]

## 2014-03-23 NOTE — ED Notes (Signed)
Patient complaining of right-sided neck pain since yesterday. States "it looks like my arteries swell up on that side and it hurts real bad, then it goes back down and stops hurting." Noted redness to area and warm to touch at triage.

## 2014-03-28 ENCOUNTER — Other Ambulatory Visit: Payer: Self-pay | Admitting: Family Medicine

## 2014-03-29 ENCOUNTER — Emergency Department (HOSPITAL_COMMUNITY)
Admission: EM | Admit: 2014-03-29 | Discharge: 2014-03-30 | Disposition: A | Discharge status: Home / Self Care | Payer: PPO | Attending: Emergency Medicine | Admitting: Emergency Medicine

## 2014-03-29 ENCOUNTER — Encounter (HOSPITAL_COMMUNITY): Payer: Self-pay | Admitting: Emergency Medicine

## 2014-03-29 DIAGNOSIS — I10 Essential (primary) hypertension: Secondary | ICD-10-CM | POA: Insufficient documentation

## 2014-03-29 DIAGNOSIS — S46811A Strain of other muscles, fascia and tendons at shoulder and upper arm level, right arm, initial encounter: Secondary | ICD-10-CM | POA: Insufficient documentation

## 2014-03-29 DIAGNOSIS — Z79899 Other long term (current) drug therapy: Secondary | ICD-10-CM | POA: Diagnosis not present

## 2014-03-29 DIAGNOSIS — J449 Chronic obstructive pulmonary disease, unspecified: Secondary | ICD-10-CM | POA: Diagnosis not present

## 2014-03-29 DIAGNOSIS — F329 Major depressive disorder, single episode, unspecified: Secondary | ICD-10-CM | POA: Diagnosis not present

## 2014-03-29 DIAGNOSIS — K219 Gastro-esophageal reflux disease without esophagitis: Secondary | ICD-10-CM | POA: Insufficient documentation

## 2014-03-29 DIAGNOSIS — Z87891 Personal history of nicotine dependence: Secondary | ICD-10-CM | POA: Diagnosis not present

## 2014-03-29 DIAGNOSIS — Z86010 Personal history of colonic polyps: Secondary | ICD-10-CM | POA: Insufficient documentation

## 2014-03-29 DIAGNOSIS — S46811D Strain of other muscles, fascia and tendons at shoulder and upper arm level, right arm, subsequent encounter: Secondary | ICD-10-CM

## 2014-03-29 DIAGNOSIS — X58XXXA Exposure to other specified factors, initial encounter: Secondary | ICD-10-CM | POA: Diagnosis not present

## 2014-03-29 DIAGNOSIS — I2510 Atherosclerotic heart disease of native coronary artery without angina pectoris: Secondary | ICD-10-CM | POA: Insufficient documentation

## 2014-03-29 DIAGNOSIS — M8580 Other specified disorders of bone density and structure, unspecified site: Secondary | ICD-10-CM | POA: Diagnosis not present

## 2014-03-29 DIAGNOSIS — Y939 Activity, unspecified: Secondary | ICD-10-CM | POA: Diagnosis not present

## 2014-03-29 DIAGNOSIS — M542 Cervicalgia: Secondary | ICD-10-CM | POA: Diagnosis present

## 2014-03-29 DIAGNOSIS — Y999 Unspecified external cause status: Secondary | ICD-10-CM | POA: Diagnosis not present

## 2014-03-29 DIAGNOSIS — Z87448 Personal history of other diseases of urinary system: Secondary | ICD-10-CM | POA: Insufficient documentation

## 2014-03-29 DIAGNOSIS — Y929 Unspecified place or not applicable: Secondary | ICD-10-CM | POA: Insufficient documentation

## 2014-03-29 NOTE — ED Notes (Signed)
Pt has right side neck pain x 2 weeks, seen previously in ED, given valium for pain, hasn't been able to get appointment with doctor's office for follow-up.

## 2014-03-29 NOTE — ED Provider Notes (Signed)
CSN: [id number]     Arrival date & time 03/29/14  2304 History  This chart was scribed for Janice Norrie, MD by Rayfield Citizen, ED Scribe. This patient was seen in room [facility]17[facility] and the patient's care was started at 11:57 PM.    Chief Complaint  Patient presents with  . Neck Pain   The history is provided by the patient. No language interpreter was used.     HPI Comments: Carla [name] is a right-hand dominant 69 y.o. female who presents to the Emergency Department complaining of two weeks of intermittent right-sided neck pain running down her right upper back (episodes lasting approximately 3 seconds at a time, 15-20x per day). She denies recent trauma or injury. She states she sees a bulging in her right posterior neck and then it gets painful. Her pain worsens with changing positions. She was seen here two weeks PTA and told her pain was due to a muscle spasm; she was given Valium which she took without relief. She and her family also note however that she's are on a benzodiazepine and they did not feel like it would work. They were advised that value is a muscle relaxer and her Xanax isn't. She states she's been using heat and ice without improvement. She denies headache, blurred or double vision, numbness in fingers or toes, weakness in hands. She states she's been calling her PCP but is unable to get a follow-up appointment. She does have appointment on January 27.  Patient has "a few" hydrocodones left from a previous prescription by Dr. Moshe Cipro.   PCP is Tula Nakayama, MD.  Patient has never seen an orthopedic doctor.    Past Medical History  Diagnosis Date  . ASCVD (arteriosclerotic cardiovascular disease)     No critical disease on cath in 8/97: mild AI with trival, if any l AS; negative stress nuclear in 2010  . Peripheral vascular disease     stauts post aortabifemoral graft    . Tobacco abuse     has stopped  . Chronic bronchitis   . Hypertension   . Fasting hyperglycemia    . Elevated hemoglobin A1c   . Hydronephrosis   . GERD (gastroesophageal reflux disease)   . Back pain   . Depression   . Osteoporosis   . Secondary erythrocytosis 08/05/2012    Secondary to COPD  . Cervical disc herniation   . Colon polyps    Past Surgical History  Procedure Laterality Date  . Cholecystectomy    . Aorta bifem graft    . Right kidney surgery following damage during arterial surgery    . Partial hysterectomy    . Total abdominal hysterectomy w/ bilateral salpingoophorectomy  1975  . Lysis of adhesions  2000  . Abdominal hysterectomy    . Colonoscopy  06/23/2006    Dr. Fields:Normal colon without evidence of polyps, masses, inflammatory changes/Normal retroflex view of the rectum  . Colonoscopy N/A 08/24/2013    Procedure: COLONOSCOPY;  Surgeon: Danie Binder, MD;  Location: [facility];  Service: Endoscopy;  Laterality: N/A;  10:30-moved to [place] notified pt   Family History  Problem Relation Age of Onset  . Heart attack Mother   . Heart attack Father   . COPD Sister   . Diabetes Sister   . Diabetes Sister   . Coronary artery disease Sister   . Diabetes Sister   . Heart disease Sister   . COPD Sister   . Colon cancer  Neg Hx    History  Substance Use Topics  . Smoking status: Former Smoker -- 0.50 packs/day    Types: Cigarettes  . Smokeless tobacco: Former Systems developer    Quit date: 05/12/2012  . Alcohol Use: No   She is a nonsmoker with no EtOH use.  She is retired at present.    OB History    No data available     Review of Systems  All other systems reviewed and are negative.  Allergies  Review of patient's allergies indicates no known allergies.  Home Medications   Prior to Admission medications   Medication Sig Start Date End Date Taking? Authorizing Provider  ALPRAZolam Duanne Moron) 0.5 MG tablet Take 1 tablet (0.5 mg total) by mouth 2 (two) times daily. 02/11/14  Yes Fayrene Helper, MD  amLODipine (NORVASC) 5 MG tablet TAKE (1)  TABLET BY MOUTH DAILY. 03/21/14  Yes Fayrene Helper, MD  atorvastatin (LIPITOR) 40 MG tablet Take 1 tablet (40 mg total) by mouth daily. 02/10/14  Yes Fayrene Helper, MD  benazepril (LOTENSIN) 10 MG tablet Take 10 mg by mouth daily.   Yes Historical Provider, MD  CALCIUM PO Take 1 tablet by mouth daily.   Yes Historical Provider, MD  HYDROcodone-acetaminophen [facility]) 10-325 MG per tablet One three times daily 03/04/14 04/04/14 Yes Fayrene Helper, MD  Linaclotide [facility]) 290 MCG CAPS capsule Take 290 mcg by mouth daily.   Yes Historical Provider, MD  loratadine (CLARITIN) 10 MG tablet Take 1 tablet (10 mg total) by mouth daily. 10/20/13  Yes Fayrene Helper, MD  Multiple Vitamin (MULTIVITAMIN WITH MINERALS) TABS Take 1 tablet by mouth every evening.    Yes Historical Provider, MD  Omega-3 Fatty Acids (FISH OIL PO) Take 1 capsule by mouth daily.   Yes Historical Provider, MD  potassium chloride (K-DUR) 10 MEQ tablet Take 10 mEq by mouth daily.   Yes Historical Provider, MD  albuterol (PROVENTIL HFA;VENTOLIN HFA) 108 (90 BASE) MCG/ACT inhaler Inhale 1-2 puffs into the lungs every 6 (six) hours as needed for wheezing or shortness of breath. 11/27/13   Janice Norrie, MD  Cholecalciferol (VITAMIN D) 2000 UNITS CAPS Take 1 capsule by mouth daily.    Historical Provider, MD  diazepam (VALIUM) 5 MG tablet Take 1 tablet (5 mg total) by mouth every 6 (six) hours as needed for muscle spasms. 03/23/14   Jasper Riling. Pickering, MD  gabapentin (NEURONTIN) 300 MG capsule Take 1 capsule (300 mg total) by mouth at bedtime. 01/20/14   Fayrene Helper, MD   BP 150/80 mmHg  Pulse 79  Temp(Src) 97.5 F (36.4 C) (Oral)  Resp 18  Ht 5\' 3"  (1.6 m)  Wt 139 lb (63.05 kg)  BMI 24.63 kg/m2  SpO2 100%  Vital signs normal   Physical Exam  Constitutional: She is oriented to person, place, and time. She appears well-developed and well-nourished.  Non-toxic appearance. She does not appear ill. No distress.   HENT:  Head: Normocephalic and atraumatic.  Right Ear: External ear normal.  Left Ear: External ear normal.  Nose: Nose normal. No mucosal edema or rhinorrhea.  Mouth/Throat: Oropharynx is clear and moist and mucous membranes are normal. No dental abscesses or uvula swelling.  Eyes: Conjunctivae and EOM are normal. Pupils are equal, round, and reactive to light.  Neck: Normal range of motion and full passive range of motion without pain. Neck supple.    Discomfort when looking from left to right in her right  posterior neck; no pain with forward or downward gaze. Tender along superior medial trapezius muscle.   Pulmonary/Chest: Effort normal. No respiratory distress. She has no rhonchi. She exhibits no crepitus.  Abdominal: Normal appearance.  Musculoskeletal: Normal range of motion. She exhibits no edema or tenderness.  Moves all extremities well.   Neurological: She is alert and oriented to person, place, and time. She has normal strength. No cranial nerve deficit.  Equal grips, normal upper motor strength  Skin: Skin is warm, dry and intact. No rash noted. No erythema. No pallor.  Psychiatric: She has a normal mood and affect. Her speech is normal and behavior is normal. Her mood appears not anxious.  Nursing note and vitals reviewed.   ED Course  Procedures  Medications  naproxen (NAPROSYN) tablet 500 mg (500 mg Oral Given 03/30/14 0031)  cyclobenzaprine (FLEXERIL) tablet 10 mg (10 mg Oral Given 03/30/14 0031)     DIAGNOSTIC STUDIES: Oxygen Saturation is 100% on RA, normal by my interpretation.    COORDINATION OF CARE: 12:02 AM Discussed treatment plan with pt at bedside and pt agreed to plan.  Review of the [place] shows patient gets #90 hydrocodone 10/325 monthly from Dr. Moshe Cipro. Her last prescription was filled on December 16. She also gets #60 alprazolam 0.5 mg tablets, last filled December 24. Patient states she has not run out of either.   Labs  Review Labs Reviewed - No data to display  Imaging Review No results found.   EKG Interpretation None      MDM   Final diagnoses:  Trapezius strain, right, subsequent encounter    Discharge Medication List as of 03/30/2014 12:24 AM    START taking these medications   Details  naproxen (NAPROSYN) 500 MG tablet Take 1 tablet (500 mg total) by mouth 2 (two) times daily., Starting 03/30/2014, Until Discontinued, Print        Plan discharge  Rolland Porter, MD, FACEP   I personally performed the services described in this documentation, which was scribed in my presence. The recorded information has been reviewed and considered.  Rolland Porter, MD, Abram Sander      Janice Norrie, MD 03/30/14 [phone]

## 2014-03-30 ENCOUNTER — Telehealth: Payer: Self-pay | Admitting: Family Medicine

## 2014-03-30 MED ORDER — NAPROXEN 250 MG PO TABS
500.0000 mg | ORAL_TABLET | Freq: Once | ORAL | Status: AC
  Administered 2014-03-30: 500 mg via ORAL
  Filled 2014-03-30: qty 2

## 2014-03-30 MED ORDER — NAPROXEN 500 MG PO TABS: 500.0000 mg | ORAL_TABLET | Freq: Two times a day (BID) | ORAL | Status: DC

## 2014-03-30 MED ORDER — CYCLOBENZAPRINE HCL 10 MG PO TABS
10.0000 mg | ORAL_TABLET | Freq: Once | ORAL | Status: AC
  Administered 2014-03-30: 10 mg via ORAL
  Filled 2014-03-30: qty 1

## 2014-03-30 MED ORDER — CYCLOBENZAPRINE HCL 10 MG PO TABS: 10.0000 mg | ORAL_TABLET | Freq: Three times a day (TID) | ORAL | Status: DC | PRN

## 2014-03-30 NOTE — Discharge Instructions (Signed)
Use ice and heat to the painful area. Take the medications as prescribed. Follow up with Dr Moshe Cipro if not improving in the next week.

## 2014-03-30 NOTE — Telephone Encounter (Signed)
Pls contact pt and let her know that i am concerned re her need to go to the eD twice in the past week for neck pain She was referred for neck surgery in the past , and declined then,  IO I recommend referal at least to a pain clinic as  She may benefit from epidural injections and improved pain managemnt, pls let  Me know what she wishes, pls enter referrals decided on , I will sign

## 2014-03-31 NOTE — Telephone Encounter (Signed)
Attempted to reach patient by phone.  Message left for patient to return call

## 2014-04-01 ENCOUNTER — Other Ambulatory Visit: Payer: Self-pay

## 2014-04-01 DIAGNOSIS — M5090 Cervical disc disorder, unspecified, unspecified cervical region: Secondary | ICD-10-CM

## 2014-04-01 MED ORDER — HYDROCODONE-ACETAMINOPHEN 10-325 MG PO TABS: ORAL_TABLET | ORAL | Status: DC

## 2014-04-05 NOTE — Telephone Encounter (Signed)
Patient in to office to collect script.  She will await any referrals until she come for her appt here.

## 2014-04-06 LAB — COMPLETE METABOLIC PANEL WITH GFR
ALT: 26 U/L (ref 0–35)
AST: 30 U/L (ref 0–37)
Albumin: 4.7 g/dL (ref 3.5–5.2)
Alkaline Phosphatase: 102 U/L (ref 39–117)
BILIRUBIN TOTAL: 0.6 mg/dL (ref 0.2–1.2)
BUN: 12 mg/dL (ref 6–23)
CO2: 30 meq/L (ref 19–32)
CREATININE: 0.92 mg/dL (ref 0.50–1.10)
Calcium: 11.4 mg/dL — ABNORMAL HIGH (ref 8.4–10.5)
Chloride: 101 mEq/L (ref 96–112)
GFR, EST AFRICAN AMERICAN: 73 mL/min
GFR, EST NON AFRICAN AMERICAN: 64 mL/min
GLUCOSE: 88 mg/dL (ref 70–99)
Potassium: 4.1 mEq/L (ref 3.5–5.3)
Sodium: 142 mEq/L (ref 135–145)
TOTAL PROTEIN: 8 g/dL (ref 6.0–8.3)

## 2014-04-06 LAB — TSH: TSH: 1.778 u[IU]/mL (ref 0.350–4.500)

## 2014-04-06 LAB — HEMOGLOBIN A1C
HEMOGLOBIN A1C: 6.4 % — AB (ref <5.7)
MEAN PLASMA GLUCOSE: 137 mg/dL — AB (ref <117)

## 2014-04-06 LAB — LIPID PANEL
Cholesterol: 230 mg/dL — ABNORMAL HIGH (ref 0–200)
HDL: 46 mg/dL (ref >39)
LDL Cholesterol: 122 mg/dL — ABNORMAL HIGH (ref 0–99)
Total CHOL/HDL Ratio: 5 Ratio
Triglycerides: 311 mg/dL — ABNORMAL HIGH (ref <150)
VLDL: 62 mg/dL — ABNORMAL HIGH (ref 0–40)

## 2014-04-16 DIAGNOSIS — J209 Acute bronchitis, unspecified: Secondary | ICD-10-CM | POA: Insufficient documentation

## 2014-04-16 NOTE — Assessment & Plan Note (Signed)
Oral meds prescribed for 3 month

## 2014-04-16 NOTE — Assessment & Plan Note (Signed)
Continue chronic pain management , pt has decided to delay surgery at this time

## 2014-04-16 NOTE — Assessment & Plan Note (Signed)
Additional tablet ACe inhibitor added for renal protection

## 2014-04-16 NOTE — Assessment & Plan Note (Signed)
z pack prescribed 

## 2014-04-16 NOTE — Assessment & Plan Note (Signed)
Controlled, no change in medication  

## 2014-04-16 NOTE — Progress Notes (Signed)
   Subjective:    Patient ID: Carla [name], female    DOB: [birth year], 69 y.o.   MRN: [id number]  HPI 1 week h/o excessive chest congestion, cough productive of thick yellow sputum, fever and chills.Chest sore from excessive cough. Increasing malaise , poor sleep due to excessive cough, and reduced  appetite. Denies sinus pressure, nasal congestion or drainage,  ear pain or sore throat. No benefit from OTC medication used.     Review of Systems See HPI C/o increased , nasal congestion, denies ear pain or sore throat. . Denies chest pains, palpitations and leg swelling Denies abdominal pain, nausea, vomiting,diarrhea or constipation.   Denies dysuria, frequency, hesitancy or incontinence. Increased  joint pain, with excess cough and illness C/o  Headache with cough, denies  seizures, numbness, or tingling. Denies  Uncontrolled depression, anxiety or insomnia. C/o thickened toenails, wants treatment.        Objective:   Physical Exam BP 140/78 mmHg  Pulse 88  Temp(Src) 98.6 F (37 C)  Resp 18  Ht 5' 1.75" (1.568 m)  Wt 130 lb 0.6 oz (58.986 kg)  BMI 23.99 kg/m2  SpO2 92% Patient alert and oriented and in no cardiopulmonary distress.  HEENT: No facial asymmetry, EOMI,   oropharynx pink and moist.  Neck decreased ROM no JVD, no mass.TM clear, no sinus tenderness  Chest: Decreased air entry bilateral crackles CVS: S1, S2 no murmurs, no S3.Regular rate.  ABD: Soft non tender.   Ext: No edema  MS: Decreased  ROM spine, shoulders, hips and knees.  Skin: Intact, no ulcerations or rash noted.  Psych: Good eye contact, normal affect. Memory intact not anxious or depressed appearing.  CNS: CN 2-12 intact, power,  normal throughout.no focal deficits noted.        Assessment & Plan:  Essential hypertension Additional tablet ACe inhibitor added for renal protection   Acute bronchitis z pack prescribed   Cervical neck pain with evidence of disc  disease Continue chronic pain management , pt has decided to delay surgery at this time   Onychomycosis Oral meds prescribed for 3 month   GAD (generalized anxiety disorder) Controlled, no change in medication

## 2014-04-20 ENCOUNTER — Encounter: Payer: Self-pay | Admitting: Family Medicine

## 2014-04-20 ENCOUNTER — Telehealth: Payer: Self-pay

## 2014-04-20 ENCOUNTER — Other Ambulatory Visit: Payer: Self-pay | Admitting: Family Medicine

## 2014-04-20 ENCOUNTER — Ambulatory Visit (INDEPENDENT_AMBULATORY_CARE_PROVIDER_SITE_OTHER): Payer: PPO | Admitting: Family Medicine

## 2014-04-20 VITALS — BP 140/78 | HR 96 | Resp 16 | Ht 62.0 in | Wt 139.8 lb

## 2014-04-20 DIAGNOSIS — Z0000 Encounter for general adult medical examination without abnormal findings: Secondary | ICD-10-CM

## 2014-04-20 DIAGNOSIS — Z23 Encounter for immunization: Secondary | ICD-10-CM

## 2014-04-20 DIAGNOSIS — H547 Unspecified visual loss: Secondary | ICD-10-CM

## 2014-04-20 DIAGNOSIS — E1151 Type 2 diabetes mellitus with diabetic peripheral angiopathy without gangrene: Secondary | ICD-10-CM

## 2014-04-20 DIAGNOSIS — E785 Hyperlipidemia, unspecified: Secondary | ICD-10-CM

## 2014-04-20 DIAGNOSIS — M810 Age-related osteoporosis without current pathological fracture: Secondary | ICD-10-CM

## 2014-04-20 DIAGNOSIS — M62838 Other muscle spasm: Secondary | ICD-10-CM

## 2014-04-20 DIAGNOSIS — E210 Primary hyperparathyroidism: Secondary | ICD-10-CM

## 2014-04-20 MED ORDER — BENZONATATE 100 MG PO CAPS: 100.0000 mg | ORAL_CAPSULE | Freq: Three times a day (TID) | ORAL | Status: DC | PRN

## 2014-04-20 MED ORDER — DIAZEPAM 5 MG PO TABS: ORAL_TABLET | ORAL | Status: DC

## 2014-04-20 NOTE — Patient Instructions (Addendum)
F/u in 4.5 month, call if you need me before Prevnar today  Fasting labs in 4 month  We will attempt to get you back on crestor since your cholesterol has increased and is too high  Eye exam for March as requested, very poor vision in right eye  Mammogram will be scheduled for  Feb  I will send 10 tabs for neck spasm in case this recurrs  Congrats on 1 year NOT smoking

## 2014-04-20 NOTE — Telephone Encounter (Signed)
Pt forgot to tell you she needs something for a cough. Has been coughing off and on since she quit smoking but here recently she will cough up white phlegm and feels like its stuck in her chest. Robitussin not helping. Please advise. Patient wants to wait in lobby to hear answer.

## 2014-04-20 NOTE — Telephone Encounter (Signed)
pls let her know tessalon perles called in

## 2014-04-20 NOTE — Progress Notes (Signed)
Subjective:    Patient ID: Carla [name], female    DOB: [birth year], 69 y.o.   MRN: [id number]  HPI Preventive Screening-Counseling & Management   Patient present here today for a Medicare annual wellness visit.   Current Problems (verified)   Medications Prior to Visit Allergies (verified)   PAST HISTORY  Family History (verified)   Social History Married 3 boys, 1 deceased, quit smoking almost 2 years ago. 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 Importance of limiting her fried fatty foods and eating more fruits and vegetables and trying to limit red meat to twice a week    Cardiac risk factors: ASCVD  Depression Screen  (Note: if answer to either of the following is "Yes", a more complete depression screening is indicated)   Over the past two weeks, have you felt down, depressed or hopeless? Some depression because she can't do a lot during the winter  Over the past two weeks, have you felt little interest or pleasure in doing things? No  Have you lost interest or pleasure in daily life? No  Do you often feel hopeless? No  Do you cry easily over simple problems? No   Activities of Daily Living  In your present state of health, do you have any difficulty performing the following activities?  Driving?: No Managing money?: No Feeding yourself?:No Getting from bed to chair?:No Climbing a flight of stairs?: no  Preparing food and eating?:No Bathing or showering?:No Getting dressed?:No Getting to the toilet?:No Using the toilet?:No Moving around from place to place?: No  Fall Risk Assessment In the past year have you fallen or had a near fall?:No Are you currently taking any medications that make you dizzy?:No   Hearing Difficulties: No Do you often ask people to speak up or  repeat themselves?:No Do you experience ringing or noises in your ears?:No Do you have difficulty understanding soft or whispered voices?:No  Cognitive Testing  Alert? Yes Normal Appearance?Yes  Oriented to person? Yes Place? Yes  Time? Yes  Displays appropriate judgment?Yes  Can read the correct time from a watch face? yes Are you having problems remembering things?No  Advanced Directives have been discussed with the patient?Yes, no living will, brochure given , full code at this time   List the Names of Other Physician/Practitioners you currently use:    Indicate any recent Medical Services you may have received from other than [facility] providers in the past year (date may be approximate).   Assessment:    Annual Wellness Exam   Plan:      Medicare Attestation  I have personally reviewed:  The patient's medical and social history  Their use of alcohol, tobacco or illicit drugs  Their current medications and supplements  The patient's functional ability including ADLs,fall risks, home safety risks, cognitive, and hearing and visual impairment  Diet and physical activities  Evidence for depression or mood disorders  The patient's weight, height, BMI, and visual acuity have been recorded in the chart. I have made referrals, counseling, and provided education to the patient based on review of the above and I have provided the patient with a written personalized care plan for preventive services.      Review of Systems     Objective:   Physical Exam BP 140/78 mmHg  Pulse 96  Resp 16  Ht 5\' 2"  (1.575 m)  Wt 139 lb 12.8 oz (63.413 kg)  BMI 25.56 kg/m2  SpO2 97%        Assessment & Plan:  Need for vaccination with 13-polyvalent pneumococcal conjugate vaccine Vaccine administered at visit.    Medicare annual wellness visit, subsequent Annual exam as documented. Counseling done  re healthy lifestyle involving commitment to 150 minutes exercise per week, heart  healthy diet, and attaining healthy weight.The importance of adequate sleep also discussed. Regular seat belt use and home safety, is also discussed. Changes in health habits are decided on by the patient with goals and time frames  set for achieving them. Immunization and cancer screening needs are specifically addressed at this visit. Prevnar is administered   Trapezius muscle spasm Some improvement since Ed visit, however, still gets intermittent muscle spasm, Initially valium sent but pt states does nothing, had received flexeril in the ED which worked, same prescribed   Hyperlipidemia LDL goal <100 Deteriroated, liupitor is not as effective as crestor in this pt wqith ASCVD, she needs med thatr is effective to lower chol Hyperlipidemia:Low fat diet discussed and encouraged.     Reduced vision 20/2000 in right eye referred to opthalmology

## 2014-04-20 NOTE — Telephone Encounter (Signed)
Patient aware.

## 2014-04-21 ENCOUNTER — Telehealth: Payer: Self-pay | Admitting: *Deleted

## 2014-04-21 NOTE — Telephone Encounter (Signed)
Pt called requesting a nurse to call her back. Please advise [phone]

## 2014-04-21 NOTE — Telephone Encounter (Signed)
Spoke with patient and confirmed with her that controlled rx is not due yet.

## 2014-04-22 ENCOUNTER — Other Ambulatory Visit: Payer: Self-pay

## 2014-04-22 MED ORDER — CYCLOBENZAPRINE HCL 10 MG PO TABS: 10.0000 mg | ORAL_TABLET | Freq: Three times a day (TID) | ORAL | Status: DC | PRN

## 2014-04-23 ENCOUNTER — Telehealth: Payer: Self-pay | Admitting: Family Medicine

## 2014-04-23 DIAGNOSIS — M62838 Other muscle spasm: Secondary | ICD-10-CM | POA: Insufficient documentation

## 2014-04-23 DIAGNOSIS — H547 Unspecified visual loss: Secondary | ICD-10-CM | POA: Insufficient documentation

## 2014-04-23 DIAGNOSIS — Z23 Encounter for immunization: Secondary | ICD-10-CM | POA: Insufficient documentation

## 2014-04-23 HISTORY — DX: Other muscle spasm: M62.838

## 2014-04-23 NOTE — Assessment & Plan Note (Signed)
Annual exam as documented. Counseling done  re healthy lifestyle involving commitment to 150 minutes exercise per week, heart healthy diet, and attaining healthy weight.The importance of adequate sleep also discussed. Regular seat belt use and home safety, is also discussed. Changes in health habits are decided on by the patient with goals and time frames  set for achieving them. Immunization and cancer screening needs are specifically addressed at this visit. Prevnar is administered

## 2014-04-23 NOTE — Assessment & Plan Note (Signed)
Deteriroated, liupitor is not as effective as crestor in this pt wqith ASCVD, she needs med thatr is effective to lower chol Hyperlipidemia:Low fat diet discussed and encouraged.

## 2014-04-23 NOTE — Assessment & Plan Note (Signed)
Vaccine administered at visit.  

## 2014-04-23 NOTE — Telephone Encounter (Signed)
Pls call pt, let her know I have referred her BACK to Dr Dorris Fetch, who she saw in 2014 due to high calcium levels, they are now higher, she needs to return to endo who she saw in 2014, her calcium level has risen, I have already entered the referral ALSO advise since  She has osteoporsis, she should be taking the once weekly bone builder fosmax (or alendronate, whichever is covered) I see where it was sent in in 2015 when her dexa was reported osteoporsis, not sure why not on active med list , I entered pls send after you spk wih her, explain value of strengthening bones therefore lowering fracture risk  She is also NOT to take supplemental calcium since she has hypercalcemia  ?? pls ask!

## 2014-04-23 NOTE — Assessment & Plan Note (Signed)
Some improvement since Ed visit, however, still gets intermittent muscle spasm, Initially valium sent but pt states does nothing, had received flexeril in the ED which worked, same prescribed

## 2014-04-23 NOTE — Assessment & Plan Note (Signed)
20/2000 in right eye referred to opthalmology

## 2014-04-25 ENCOUNTER — Other Ambulatory Visit: Payer: Self-pay

## 2014-04-25 DIAGNOSIS — M810 Age-related osteoporosis without current pathological fracture: Secondary | ICD-10-CM

## 2014-04-25 MED ORDER — ALENDRONATE SODIUM 70 MG PO TABS: 70.0000 mg | ORAL_TABLET | ORAL | Status: DC

## 2014-04-25 NOTE — Telephone Encounter (Signed)
Pt aware and will stop multivitamin since it contains calcium and referred to dr nida

## 2014-04-28 LAB — HM DIABETES EYE EXAM

## 2014-04-29 ENCOUNTER — Other Ambulatory Visit: Payer: Self-pay

## 2014-04-29 DIAGNOSIS — M5090 Cervical disc disorder, unspecified, unspecified cervical region: Secondary | ICD-10-CM

## 2014-04-29 MED ORDER — HYDROCODONE-ACETAMINOPHEN 10-325 MG PO TABS: ORAL_TABLET | ORAL | Status: DC

## 2014-05-11 ENCOUNTER — Telehealth: Payer: Self-pay | Admitting: Family Medicine

## 2014-05-11 ENCOUNTER — Other Ambulatory Visit: Payer: Self-pay

## 2014-05-11 DIAGNOSIS — E785 Hyperlipidemia, unspecified: Secondary | ICD-10-CM

## 2014-05-11 MED ORDER — ROSUVASTATIN CALCIUM 20 MG PO TABS: 20.0000 mg | ORAL_TABLET | Freq: Every day | ORAL | Status: DC

## 2014-05-11 NOTE — Telephone Encounter (Signed)
Crestor sent to pharmacy.  Will call and check price.

## 2014-05-12 NOTE — Telephone Encounter (Signed)
Patient aware that she can collect Crestor with a $7 copay

## 2014-05-13 ENCOUNTER — Telehealth: Payer: Self-pay | Admitting: Family Medicine

## 2014-05-13 DIAGNOSIS — L989 Disorder of the skin and subcutaneous tissue, unspecified: Secondary | ICD-10-CM

## 2014-05-13 DIAGNOSIS — L299 Pruritus, unspecified: Secondary | ICD-10-CM

## 2014-05-13 NOTE — Telephone Encounter (Signed)
Please advise if she can be referred.

## 2014-05-15 NOTE — Telephone Encounter (Signed)
Referral entered pls sched and ;let her know

## 2014-05-16 ENCOUNTER — Other Ambulatory Visit (HOSPITAL_COMMUNITY): Payer: Self-pay | Admitting: "Endocrinology

## 2014-05-16 DIAGNOSIS — M810 Age-related osteoporosis without current pathological fracture: Secondary | ICD-10-CM

## 2014-05-23 ENCOUNTER — Ambulatory Visit (HOSPITAL_COMMUNITY)
Admission: RE | Admit: 2014-05-23 | Discharge: 2014-05-23 | Disposition: A | Discharge status: Home / Self Care | Payer: PPO | Source: Ambulatory Visit | Attending: "Endocrinology | Admitting: "Endocrinology

## 2014-05-23 DIAGNOSIS — M810 Age-related osteoporosis without current pathological fracture: Secondary | ICD-10-CM | POA: Diagnosis not present

## 2014-05-25 ENCOUNTER — Other Ambulatory Visit (HOSPITAL_COMMUNITY): Payer: Self-pay | Admitting: "Endocrinology

## 2014-05-25 ENCOUNTER — Other Ambulatory Visit: Payer: Self-pay | Admitting: Family Medicine

## 2014-06-01 ENCOUNTER — Encounter (HOSPITAL_COMMUNITY): Payer: PPO

## 2014-06-03 ENCOUNTER — Other Ambulatory Visit: Payer: Self-pay

## 2014-06-03 DIAGNOSIS — M5090 Cervical disc disorder, unspecified, unspecified cervical region: Secondary | ICD-10-CM

## 2014-06-03 MED ORDER — HYDROCODONE-ACETAMINOPHEN 10-325 MG PO TABS: ORAL_TABLET | ORAL | Status: DC

## 2014-06-11 ENCOUNTER — Other Ambulatory Visit: Payer: Self-pay | Admitting: Family Medicine

## 2014-06-14 ENCOUNTER — Other Ambulatory Visit: Payer: Self-pay | Admitting: Family Medicine

## 2014-06-14 ENCOUNTER — Encounter (HOSPITAL_COMMUNITY)
Admission: RE | Admit: 2014-06-14 | Discharge: 2014-06-14 | Disposition: A | Discharge status: Home / Self Care | Payer: PPO | Source: Ambulatory Visit | Attending: "Endocrinology | Admitting: "Endocrinology

## 2014-06-14 ENCOUNTER — Encounter (HOSPITAL_COMMUNITY): Payer: Self-pay

## 2014-06-14 MED ORDER — TECHNETIUM TC 99M SESTAMIBI GENERIC - CARDIOLITE: 25.0000 | Freq: Once | INTRAVENOUS | Status: AC | PRN

## 2014-06-15 ENCOUNTER — Ambulatory Visit (INDEPENDENT_AMBULATORY_CARE_PROVIDER_SITE_OTHER): Payer: PPO | Admitting: Family Medicine

## 2014-06-15 ENCOUNTER — Ambulatory Visit (HOSPITAL_COMMUNITY)
Admission: RE | Admit: 2014-06-15 | Discharge: 2014-06-15 | Disposition: A | Discharge status: Home / Self Care | Payer: PPO | Source: Ambulatory Visit | Attending: Family Medicine | Admitting: Family Medicine

## 2014-06-15 ENCOUNTER — Encounter: Payer: Self-pay | Admitting: Family Medicine

## 2014-06-15 VITALS — BP 148/72 | HR 78 | Resp 18 | Wt 137.0 lb

## 2014-06-15 DIAGNOSIS — I10 Essential (primary) hypertension: Secondary | ICD-10-CM

## 2014-06-15 DIAGNOSIS — M25571 Pain in right ankle and joints of right foot: Secondary | ICD-10-CM

## 2014-06-15 DIAGNOSIS — M549 Dorsalgia, unspecified: Secondary | ICD-10-CM

## 2014-06-15 DIAGNOSIS — E1159 Type 2 diabetes mellitus with other circulatory complications: Secondary | ICD-10-CM

## 2014-06-15 DIAGNOSIS — R21 Rash and other nonspecific skin eruption: Secondary | ICD-10-CM | POA: Diagnosis not present

## 2014-06-15 DIAGNOSIS — M79642 Pain in left hand: Secondary | ICD-10-CM | POA: Diagnosis not present

## 2014-06-15 DIAGNOSIS — M25532 Pain in left wrist: Secondary | ICD-10-CM

## 2014-06-15 DIAGNOSIS — E1151 Type 2 diabetes mellitus with diabetic peripheral angiopathy without gangrene: Secondary | ICD-10-CM

## 2014-06-15 DIAGNOSIS — E785 Hyperlipidemia, unspecified: Secondary | ICD-10-CM

## 2014-06-15 MED ORDER — PREDNISONE 5 MG PO TABS: 5.0000 mg | ORAL_TABLET | Freq: Two times a day (BID) | ORAL | Status: AC

## 2014-06-15 NOTE — Patient Instructions (Addendum)
F/u in May as before  Xray of left hand and forearm today and right ankle\  Prednisone prescribed for 5 days , call back if still hurting a lot  You are referred to Dr Nevada Crane re rash on arms for past 3 weeks  Blod pressure is higher than it should be, commit tio regular exercise, low salt intake and take meds every day on schedule  Microalb today

## 2014-06-16 LAB — MICROALBUMIN / CREATININE URINE RATIO
Creatinine, Urine: 139.5 mg/dL
MICROALB/CREAT RATIO: 17.2 mg/g (ref 0.0–30.0)
Microalb, Ur: 2.4 mg/dL — ABNORMAL HIGH (ref <2.0)

## 2014-07-01 ENCOUNTER — Other Ambulatory Visit: Payer: Self-pay

## 2014-07-01 DIAGNOSIS — M5090 Cervical disc disorder, unspecified, unspecified cervical region: Secondary | ICD-10-CM

## 2014-07-01 MED ORDER — HYDROCODONE-ACETAMINOPHEN 10-325 MG PO TABS: ORAL_TABLET | ORAL | Status: DC

## 2014-07-28 ENCOUNTER — Other Ambulatory Visit: Payer: Self-pay

## 2014-07-28 DIAGNOSIS — M5090 Cervical disc disorder, unspecified, unspecified cervical region: Secondary | ICD-10-CM

## 2014-07-28 MED ORDER — HYDROCODONE-ACETAMINOPHEN 10-325 MG PO TABS: ORAL_TABLET | ORAL | Status: DC

## 2014-08-02 LAB — HEMOGLOBIN A1C
Hgb A1c MFr Bld: 6.6 % — ABNORMAL HIGH (ref <5.7)
Mean Plasma Glucose: 143 mg/dL — ABNORMAL HIGH (ref <117)

## 2014-08-02 LAB — LIPID PANEL
CHOL/HDL RATIO: 3 ratio
CHOLESTEROL: 149 mg/dL (ref 0–200)
HDL: 49 mg/dL (ref >=46)
LDL CALC: 56 mg/dL (ref 0–99)
Triglycerides: 219 mg/dL — ABNORMAL HIGH (ref <150)
VLDL: 44 mg/dL — AB (ref 0–40)

## 2014-08-02 LAB — COMPLETE METABOLIC PANEL WITH GFR
ALBUMIN: 4.3 g/dL (ref 3.5–5.2)
ALT: 23 U/L (ref 0–35)
AST: 25 U/L (ref 0–37)
Alkaline Phosphatase: 88 U/L (ref 39–117)
BUN: 13 mg/dL (ref 6–23)
CO2: 27 mEq/L (ref 19–32)
Calcium: 10 mg/dL (ref 8.4–10.5)
Chloride: 104 mEq/L (ref 96–112)
Creat: 0.96 mg/dL (ref 0.50–1.10)
GFR, EST AFRICAN AMERICAN: 70 mL/min
GFR, Est Non African American: 61 mL/min
Glucose, Bld: 115 mg/dL — ABNORMAL HIGH (ref 70–99)
POTASSIUM: 4.3 meq/L (ref 3.5–5.3)
SODIUM: 143 meq/L (ref 135–145)
TOTAL PROTEIN: 7.2 g/dL (ref 6.0–8.3)
Total Bilirubin: 0.7 mg/dL (ref 0.2–1.2)

## 2014-08-02 LAB — CALCIUM: Calcium: 10 mg/dL (ref 8.4–10.5)

## 2014-08-12 ENCOUNTER — Telehealth: Payer: Self-pay | Admitting: Family Medicine

## 2014-08-15 NOTE — Telephone Encounter (Signed)
Patient states she has been coughing up yellow phlegm for the past 1-2 weeks. A lot worse at night. No fever. Has tried robitussin DM and mucinex with no relief. Please advise

## 2014-08-15 NOTE — Telephone Encounter (Signed)
pls send z pack x 1 and let her know also tessalon perles 100mg  one 3 tiomes daily as needed #30 also phenergan DM one tsp at bedtime, as needed x 120 cc, will need to go to UC or ED if worsens, or not better in next 5 days

## 2014-08-16 MED ORDER — BENZONATATE 100 MG PO CAPS: 100.0000 mg | ORAL_CAPSULE | Freq: Three times a day (TID) | ORAL | Status: DC

## 2014-08-16 MED ORDER — PROMETHAZINE-DM 6.25-15 MG/5ML PO SYRP: 5.0000 mL | ORAL_SOLUTION | Freq: Every evening | ORAL | Status: DC | PRN

## 2014-08-16 MED ORDER — AZITHROMYCIN 250 MG PO TABS: ORAL_TABLET | ORAL | Status: DC

## 2014-08-16 NOTE — Telephone Encounter (Signed)
Message left that meds had been sent in and to call back if no better

## 2014-08-16 NOTE — Addendum Note (Signed)
Addended by: Eual Fines on: 08/16/2014 07:52 AM   Modules accepted: Orders

## 2014-08-17 ENCOUNTER — Ambulatory Visit (INDEPENDENT_AMBULATORY_CARE_PROVIDER_SITE_OTHER): Payer: PPO | Admitting: Family Medicine

## 2014-08-17 ENCOUNTER — Encounter: Payer: Self-pay | Admitting: Family Medicine

## 2014-08-17 VITALS — BP 128/78 | HR 89 | Resp 16 | Ht 62.0 in | Wt 134.0 lb

## 2014-08-17 DIAGNOSIS — I10 Essential (primary) hypertension: Secondary | ICD-10-CM

## 2014-08-17 DIAGNOSIS — M79645 Pain in left finger(s): Secondary | ICD-10-CM

## 2014-08-17 DIAGNOSIS — M79642 Pain in left hand: Secondary | ICD-10-CM

## 2014-08-17 DIAGNOSIS — J449 Chronic obstructive pulmonary disease, unspecified: Secondary | ICD-10-CM

## 2014-08-17 DIAGNOSIS — Z1231 Encounter for screening mammogram for malignant neoplasm of breast: Secondary | ICD-10-CM | POA: Diagnosis not present

## 2014-08-17 DIAGNOSIS — M810 Age-related osteoporosis without current pathological fracture: Secondary | ICD-10-CM

## 2014-08-17 DIAGNOSIS — F411 Generalized anxiety disorder: Secondary | ICD-10-CM

## 2014-08-17 DIAGNOSIS — E785 Hyperlipidemia, unspecified: Secondary | ICD-10-CM

## 2014-08-17 DIAGNOSIS — M25532 Pain in left wrist: Secondary | ICD-10-CM | POA: Diagnosis not present

## 2014-08-17 NOTE — Progress Notes (Signed)
   Subjective:    Patient ID: Carla [name], female    DOB: 1946-02-27, 69 y.o.   MRN: [id number]  HPI The PT is here for follow up and re-evaluation of chronic medical conditions, medication management and review of any available recent lab and radiology data.  Preventive health is updated, specifically  Cancer screening and Immunization.   Questions or concerns regarding consultations or procedures which the PT has had in the interim are  addressed. The PT denies any adverse reactions to current medications since the last visit.  C/o worsening wrist pain and debility Still has 3 week cough and congestion but just started medication 1 day ago       Review of Systems See HPI Denies recent fever or chills. Denies sinus pressure, nasal congestion, ear pain or sore throat. Denies chest pains, palpitations and leg swelling Denies abdominal pain, nausea, vomiting,diarrhea or constipation.   Denies dysuria, frequency, hesitancy or incontinence. oint paDenies jin, swelling and limitation in mobility. Denies headaches, seizures, numbness, or tingling. Denies  Uncontrolled  depression, anxiety or insomnia.        Objective:   Physical Exam BP 128/78 mmHg  Pulse 89  Resp 16  Ht 5\' 2"  (1.575 m)  Wt 134 lb (60.782 kg)  BMI 24.50 kg/m2  SpO2 92% Patient alert and oriented and in no cardiopulmonary distress.  HEENT: No facial asymmetry, EOMI,   oropharynx pink and moist.  Neck supple no JVD, no mass.  Chest: Decreased air entry bilateral crackles, no wheezes CVS: S1, S2 no murmurs, no S3.Regular rate.  ABD: Soft non tender.   Ext: No edema  MS: Adequate ROM spine, shoulders, hips and knees.Decreased ROM right wrist, tender along tendon  Skin: Intact, no ulcerations or rash noted.  Psych: Good eye contact, normal affect. Memory intact not anxious or depressed appearing.  CNS: CN 2-12 intact, power,  normal throughout.no focal deficits noted.        Assessment & Plan:   Wrist pain, left Unimproved  and worsening, likely tenosynovitis, refer to ortho for management   Hyperlipidemia LDL goal <100 Hyperlipidemia:Low fat diet discussed and encouraged. Marked improvement, elevated TG persist, dietary management t, no med chnage   Lipid Panel  Lab Results  Component Value Date   CHOL 149 08/01/2014   HDL 49 08/01/2014   LDLCALC 56 08/01/2014   LDLDIRECT 166* 10/11/2009   TRIG 219* 08/01/2014   CHOLHDL 3.0 08/01/2014         Essential hypertension Controlled, no change in medication DASH diet and commitment to daily physical activity for a minimum of 30 minutes discussed and encouraged, as a part of hypertension management. The importance of attaining a healthy weight is also discussed.  BP/Weight 08/17/2014 06/15/2014 04/20/2014 03/29/2014 03/23/2014 02/10/2014 2/99/3716  Systolic BP 967 893 810 175 102 585 277  Diastolic BP 78 72 78 80 80 80 75  Wt. (Lbs) 134 137.04 139.8 139 139 137.12 129  BMI 24.5 25.06 25.56 24.63 25.42 25.07 22.86         Emphysema with chronic bronchitis Started z pack prescribed one day ago along with other meds prescribed of 3 week increased congestion and cough, I have advised her to complete course of therapy   GAD (generalized anxiety disorder) Controlled, no change in medication    Osteoporosis Continue fosamax, calcium and vit D  And daily weight bearing exercise

## 2014-08-17 NOTE — Patient Instructions (Signed)
Annual physical exam in 4 month, call if you need me before  You are referred to Dr Luna Glasgow we will call today  You are referred for a mammogram  Cholesterol better, cut back on cheese, butter, and eggs, and red meat

## 2014-08-18 ENCOUNTER — Other Ambulatory Visit: Payer: Self-pay | Admitting: Family Medicine

## 2014-08-24 ENCOUNTER — Other Ambulatory Visit: Payer: Self-pay

## 2014-08-24 DIAGNOSIS — M5090 Cervical disc disorder, unspecified, unspecified cervical region: Secondary | ICD-10-CM

## 2014-08-24 MED ORDER — HYDROCODONE-ACETAMINOPHEN 10-325 MG PO TABS: ORAL_TABLET | ORAL | Status: DC

## 2014-08-30 DIAGNOSIS — R21 Rash and other nonspecific skin eruption: Secondary | ICD-10-CM | POA: Insufficient documentation

## 2014-08-30 DIAGNOSIS — M25571 Pain in right ankle and joints of right foot: Secondary | ICD-10-CM | POA: Insufficient documentation

## 2014-08-30 NOTE — Assessment & Plan Note (Signed)
Less well Controlled, no change in medicatioanagement  Patient educated about the importance of limiting  Carbohydrate intake , the need to commit to daily physical activity for a minimum of 30 minutes , and to commit weight loss.    Diabetic Labs Latest Ref Rng 08/01/2014 06/15/2014 04/05/2014 11/27/2013 11/12/2013  HbA1c <5.7 % 6.6(H) - 6.4(H) - 6.3(H)  Microalbumin <2.0 mg/dL - 2.4(H) - - -  Micro/Creat Ratio 0.0 - 30.0 mg/g - 17.2 - - -  Chol 0 - 200 mg/dL 149 - 230(H) - 172  HDL >=46 mg/dL 49 - 46 - 54  Calc LDL 0 - 99 mg/dL 56 - 122(H) - 77  Triglycerides <150 mg/dL 219(H) - 311(H) - 206(H)  Creatinine 0.50 - 1.10 mg/dL 0.96 - 0.92 1.10 1.17(H)   BP/Weight 08/17/2014 06/15/2014 04/20/2014 03/29/2014 03/23/2014 02/10/2014 7/35/3299  Systolic BP 242 683 419 622 297 989 211  Diastolic BP 78 72 78 80 80 80 75  Wt. (Lbs) 134 137.04 139.8 139 139 137.12 129  BMI 24.5 25.06 25.56 24.63 25.42 25.07 22.86   Foot/eye exam completion dates Latest Ref Rng 04/28/2014  Eye Exam No Retinopathy No Retinopathy  Foot Form Completion - -

## 2014-08-30 NOTE — Assessment & Plan Note (Signed)
Unchanged, continue chronic meds 

## 2014-08-30 NOTE — Assessment & Plan Note (Signed)
UnControlled, no change in medication, re evaluate prior to med adjustment. DASH diet and commitment to daily physical activity for a minimum of 30 minutes discussed and encouraged, as a part of hypertension management. The importance of attaining a healthy weight is also discussed.  BP/Weight 08/17/2014 06/15/2014 04/20/2014 03/29/2014 03/23/2014 02/10/2014 0/11/2328  Systolic BP 076 226 333 545 625 638 937  Diastolic BP 78 72 78 80 80 80 75  Wt. (Lbs) 134 137.04 139.8 139 139 137.12 129  BMI 24.5 25.06 25.56 24.63 25.42 25.07 22.86

## 2014-08-30 NOTE — Assessment & Plan Note (Signed)
Refer dermatology

## 2014-08-30 NOTE — Assessment & Plan Note (Signed)
Hyperlipidemia:Low fat diet discussed and encouraged. Updated lab needed at/ before next visit.    Lipid Panel  Lab Results  Component Value Date   CHOL 149 08/01/2014   HDL 49 08/01/2014   LDLCALC 56 08/01/2014   LDLDIRECT 166* 10/11/2009   TRIG 219* 08/01/2014   CHOLHDL 3.0 08/01/2014

## 2014-08-30 NOTE — Progress Notes (Signed)
Subjective:    Patient ID: Carla [name], female    DOB: May 25, 1945, 69 y.o.   MRN: [id number]  HPI The PT is here for follow up and re-evaluation of chronic medical conditions, medication management and review of any available recent lab and radiology data.  Preventive health is updated, specifically  Cancer screening and Immunization.   Questions or concerns regarding consultations or procedures which the PT has had in the interim are  addressed. The PT denies any adverse reactions to current medications since the last visit.  C/o rash x 3 weeks, both forearms, worsening,  wrist pain and ankle pain      Review of Systems See HPI Denies recent fever or chills. Denies sinus pressure, nasal congestion, ear pain or sore throat. Denies chest congestion, productive cough or wheezing. Denies chest pains, palpitations and leg swelling Denies abdominal pain, nausea, vomiting,diarrhea or constipation.   Denies dysuria, frequency, hesitancy or incontinence.  Denies headaches, seizures, numbness, or tingling. Denies uncontrolled  depression, anxiety or insomnia.       Objective:   Physical Exam  BP 148/72 mmHg  Pulse 78  Resp 18  Wt 137 lb 0.6 oz (62.161 kg)  SpO2 96% Patient alert and oriented and in no cardiopulmonary distress.  HEENT: No facial asymmetry, EOMI,   oropharynx pink and moist.  Neck supple no JVD, no mass.  Chest: Clear to auscultation bilaterally.  CVS: S1, S2 no murmurs, no S3.Regular rate.  ABD: Soft non tender.   Ext: No edema  MS: Adequate ROM spine, shoulders, hips and knees.Decreased ROM wrist Skin: Intact,  rash noted.on both upper extremities, erythematous, macular  Psych: Good eye contact, normal affect. Memory intact not anxious or depressed appearing.  CNS: CN 2-12 intact, power,  normal throughout.no focal deficits noted.       Assessment & Plan:  Hand pain, left Acute pain with reduced mobility, xray today   Wrist pain,  left Acute pain with reduced mobility, xray today, and short course of prednisone   Rash and nonspecific skin eruption Refer dermatology   Essential hypertension UnControlled, no change in medication, re evaluate prior to med adjustment. DASH diet and commitment to daily physical activity for a minimum of 30 minutes discussed and encouraged, as a part of hypertension management. The importance of attaining a healthy weight is also discussed.  BP/Weight 08/17/2014 06/15/2014 04/20/2014 03/29/2014 03/23/2014 02/10/2014 04/26/5425  Systolic BP 062 376 283 151 761 607 371  Diastolic BP 78 72 78 80 80 80 75  Wt. (Lbs) 134 137.04 139.8 139 139 137.12 129  BMI 24.5 25.06 25.56 24.63 25.42 25.07 22.86         Well controlled type 2 diabetes mellitus with peripheral circulatory disorder Less well Controlled, no change in medicatioanagement  Patient educated about the importance of limiting  Carbohydrate intake , the need to commit to daily physical activity for a minimum of 30 minutes , and to commit weight loss.    Diabetic Labs Latest Ref Rng 08/01/2014 06/15/2014 04/05/2014 11/27/2013 11/12/2013  HbA1c <5.7 % 6.6(H) - 6.4(H) - 6.3(H)  Microalbumin <2.0 mg/dL - 2.4(H) - - -  Micro/Creat Ratio 0.0 - 30.0 mg/g - 17.2 - - -  Chol 0 - 200 mg/dL 149 - 230(H) - 172  HDL >=46 mg/dL 49 - 46 - 54  Calc LDL 0 - 99 mg/dL 56 - 122(H) - 77  Triglycerides <150 mg/dL 219(H) - 311(H) - 206(H)  Creatinine 0.50 - 1.10 mg/dL 0.96 -  0.92 1.10 1.17(H)   BP/Weight 08/17/2014 06/15/2014 04/20/2014 03/29/2014 03/23/2014 02/10/2014 1/68/3729  Systolic BP 021 115 520 802 233 612 244  Diastolic BP 78 72 78 80 80 80 75  Wt. (Lbs) 134 137.04 139.8 139 139 137.12 129  BMI 24.5 25.06 25.56 24.63 25.42 25.07 22.86   Foot/eye exam completion dates Latest Ref Rng 04/28/2014  Eye Exam No Retinopathy No Retinopathy  Foot Form Completion - -         Back pain with radiation Unchanged , continue chronic  meds   Hyperlipidemia LDL goal <100 Hyperlipidemia:Low fat diet discussed and encouraged. Updated lab needed at/ before next visit.    Lipid Panel  Lab Results  Component Value Date   CHOL 149 08/01/2014   HDL 49 08/01/2014   LDLCALC 56 08/01/2014   LDLDIRECT 166* 10/11/2009   TRIG 219* 08/01/2014   CHOLHDL 3.0 08/01/2014         Right ankle pain X ray to further evaluate increased pain x 1 month

## 2014-08-30 NOTE — Assessment & Plan Note (Signed)
Unimproved  and worsening, likely tenosynovitis, refer to ortho for management

## 2014-08-30 NOTE — Assessment & Plan Note (Addendum)
Acute pain with reduced mobility, xray today, and short course of prednisone

## 2014-08-30 NOTE — Assessment & Plan Note (Signed)
Acute pain with reduced mobility, xray today

## 2014-08-30 NOTE — Assessment & Plan Note (Signed)
X ray to further evaluate increased pain x 1 month

## 2014-08-30 NOTE — Assessment & Plan Note (Signed)
Continue fosamax, calcium and vit D  And daily weight bearing exercise

## 2014-08-30 NOTE — Assessment & Plan Note (Signed)
Controlled, no change in medication  

## 2014-08-30 NOTE — Assessment & Plan Note (Addendum)
Hyperlipidemia:Low fat diet discussed and encouraged. Marked improvement, elevated TG persist, dietary management t, no med chnage   Lipid Panel  Lab Results  Component Value Date   CHOL 149 08/01/2014   HDL 49 08/01/2014   LDLCALC 56 08/01/2014   LDLDIRECT 166* 10/11/2009   TRIG 219* 08/01/2014   CHOLHDL 3.0 08/01/2014

## 2014-08-30 NOTE — Assessment & Plan Note (Signed)
Controlled, no change in medication DASH diet and commitment to daily physical activity for a minimum of 30 minutes discussed and encouraged, as a part of hypertension management. The importance of attaining a healthy weight is also discussed.  BP/Weight 08/17/2014 06/15/2014 04/20/2014 03/29/2014 03/23/2014 02/10/2014 0/06/5995  Systolic BP 741 423 953 202 334 356 861  Diastolic BP 78 72 78 80 80 80 75  Wt. (Lbs) 134 137.04 139.8 139 139 137.12 129  BMI 24.5 25.06 25.56 24.63 25.42 25.07 22.86

## 2014-08-30 NOTE — Assessment & Plan Note (Addendum)
Started z pack prescribed one day ago along with other meds prescribed of 3 week increased congestion and cough, I have advised her to complete course of therapy

## 2014-09-05 ENCOUNTER — Ambulatory Visit (HOSPITAL_COMMUNITY): Payer: PPO

## 2014-09-07 ENCOUNTER — Other Ambulatory Visit: Payer: Self-pay | Admitting: Family Medicine

## 2014-09-09 ENCOUNTER — Other Ambulatory Visit: Payer: Self-pay | Admitting: Family Medicine

## 2014-09-12 ENCOUNTER — Other Ambulatory Visit: Payer: Self-pay | Admitting: Family Medicine

## 2014-09-23 ENCOUNTER — Other Ambulatory Visit: Payer: Self-pay

## 2014-09-23 DIAGNOSIS — M5090 Cervical disc disorder, unspecified, unspecified cervical region: Secondary | ICD-10-CM

## 2014-09-23 MED ORDER — HYDROCODONE-ACETAMINOPHEN 10-325 MG PO TABS: ORAL_TABLET | ORAL | Status: DC

## 2014-09-27 ENCOUNTER — Telehealth: Payer: Self-pay

## 2014-09-27 ENCOUNTER — Other Ambulatory Visit: Payer: Self-pay

## 2014-09-27 MED ORDER — PREDNISONE 5 MG (21) PO TBPK: ORAL_TABLET | ORAL | Status: DC

## 2014-09-27 NOTE — Telephone Encounter (Signed)
pls send pred 5 mg dose pack # 21 and let her know

## 2014-09-27 NOTE — Telephone Encounter (Signed)
Med sent to pharmacy.  Will notify when in to collect narcotic.

## 2014-10-07 ENCOUNTER — Other Ambulatory Visit: Payer: Self-pay | Admitting: Family Medicine

## 2014-10-07 ENCOUNTER — Telehealth: Payer: Self-pay

## 2014-10-07 DIAGNOSIS — N760 Acute vaginitis: Secondary | ICD-10-CM

## 2014-10-07 NOTE — Telephone Encounter (Signed)
Vaginal itching and burning x 2 days. No discharge. Has tried otc monistat with no relief. Please advise

## 2014-10-07 NOTE — Telephone Encounter (Signed)
Patient declines gyne referral but will go to get lab work

## 2014-10-07 NOTE — Addendum Note (Signed)
Addended by: Eual Fines on: 10/07/2014 01:05 PM   Modules accepted: Orders

## 2014-10-07 NOTE — Telephone Encounter (Signed)
Pt has repeatedly refused physical exam.   Pls order hSV2 blood test, as this may be the issue. I also STRONGLY recommend  gyne eval  As there are mny reasons for vaginal itching ( most serious ould be vginal cancer) I will refer [facility] to [facility], if she agrees pls enter I will sign

## 2014-10-10 LAB — HSV 2 ANTIBODY, IGG: HSV 2 GLYCOPROTEIN G AB, IGG: 6.58 IV — AB

## 2014-10-12 LAB — HIV ANTIBODY (ROUTINE TESTING W REFLEX): HIV 1&2 Ab, 4th Generation: NONREACTIVE

## 2014-10-13 ENCOUNTER — Other Ambulatory Visit: Payer: Self-pay

## 2014-10-13 ENCOUNTER — Telehealth: Payer: Self-pay | Admitting: *Deleted

## 2014-10-13 MED ORDER — ACYCLOVIR 800 MG PO TABS: 800.0000 mg | ORAL_TABLET | Freq: Every day | ORAL | Status: DC

## 2014-10-13 NOTE — Telephone Encounter (Signed)
Pt called and is wondering about her test results. Pt is requesting a call back [phone].

## 2014-10-14 NOTE — Telephone Encounter (Signed)
Patient called back stating she has been up crying all night, can't sleep. Has insisted her husband go get checked next week and if he is positive she is going to kick him out. Wants to be retested to be certain and also I offered to refer her to therapist but she said she would call me back.

## 2014-10-24 ENCOUNTER — Other Ambulatory Visit: Payer: Self-pay

## 2014-10-24 DIAGNOSIS — M5090 Cervical disc disorder, unspecified, unspecified cervical region: Secondary | ICD-10-CM

## 2014-10-24 MED ORDER — HYDROCODONE-ACETAMINOPHEN 10-325 MG PO TABS: ORAL_TABLET | ORAL | Status: DC

## 2014-10-27 ENCOUNTER — Other Ambulatory Visit: Payer: Self-pay

## 2014-10-27 ENCOUNTER — Telehealth: Payer: Self-pay | Admitting: *Deleted

## 2014-10-27 DIAGNOSIS — F329 Major depressive disorder, single episode, unspecified: Secondary | ICD-10-CM

## 2014-10-27 DIAGNOSIS — F419 Anxiety disorder, unspecified: Principal | ICD-10-CM

## 2014-10-27 MED ORDER — ACYCLOVIR 800 MG PO TABS: 800.0000 mg | ORAL_TABLET | Freq: Two times a day (BID) | ORAL | Status: DC

## 2014-10-27 NOTE — Telephone Encounter (Signed)
Patient states she is burning so bad in her vaginal area she can't stand it. The 5 pills a day cleared it up about a week now its right back. She is going through extreme stress with this and I will be referring her to psych and therapy. She wants to know if you will increase her xanax until she sees psych because her nerves are absolutely shot. Please advise. I will give her rx for mainatence dose of acyclovir per verbal order

## 2014-10-27 NOTE — Telephone Encounter (Signed)
Pt called stating she is having a burning in her brain and it is worrying her to death, pt said she needs something for it, pt is wanting a call back [phone]

## 2014-10-27 NOTE — Telephone Encounter (Signed)
I am sending in acyclovir 800 mg twice daily She needs to see psychfor stress, no change in the xanaxx dose  I recommend gyne eval due to the burning itching as I have recommended before , she has refused pelvic exam for years NEEDS to see gyne also, pls refer if she agrees I will sign

## 2014-10-28 ENCOUNTER — Other Ambulatory Visit: Payer: Self-pay

## 2014-10-28 DIAGNOSIS — F419 Anxiety disorder, unspecified: Principal | ICD-10-CM

## 2014-10-28 DIAGNOSIS — F329 Major depressive disorder, single episode, unspecified: Secondary | ICD-10-CM

## 2014-10-28 NOTE — Telephone Encounter (Signed)
Called patient and left message for them to return call at the office   

## 2014-11-01 ENCOUNTER — Ambulatory Visit: Payer: PPO

## 2014-11-01 VITALS — BP 120/80

## 2014-11-01 DIAGNOSIS — N3001 Acute cystitis with hematuria: Secondary | ICD-10-CM

## 2014-11-01 MED ORDER — CIPROFLOXACIN HCL 500 MG PO TABS: 500.0000 mg | ORAL_TABLET | Freq: Two times a day (BID) | ORAL | Status: DC

## 2014-11-01 NOTE — Telephone Encounter (Signed)
Med sent for UA and will wait until she finishes med and she will let me know if she wants the referral

## 2014-11-01 NOTE — Progress Notes (Signed)
Dysuria and pelvic pressure with stinging x 1 week. Scared to urinate it hurts so bad. UA positive for infection. WIll give cipro since so symptomatic and send for culture

## 2014-11-04 ENCOUNTER — Telehealth (HOSPITAL_COMMUNITY): Payer: Self-pay | Admitting: *Deleted

## 2014-11-04 LAB — URINE CULTURE

## 2014-11-17 ENCOUNTER — Other Ambulatory Visit: Payer: Self-pay | Admitting: Family Medicine

## 2014-11-18 ENCOUNTER — Other Ambulatory Visit: Payer: Self-pay

## 2014-11-18 DIAGNOSIS — M5090 Cervical disc disorder, unspecified, unspecified cervical region: Secondary | ICD-10-CM

## 2014-11-18 MED ORDER — HYDROCODONE-ACETAMINOPHEN 10-325 MG PO TABS: ORAL_TABLET | ORAL | Status: DC

## 2014-11-25 ENCOUNTER — Other Ambulatory Visit: Payer: Self-pay | Admitting: Family Medicine

## 2014-11-30 ENCOUNTER — Telehealth: Payer: Self-pay | Admitting: Family Medicine

## 2014-11-30 NOTE — Telephone Encounter (Signed)
Patient is asking for a refill on her HYDROcodone-acetaminophen (NORCO) 10-325 MG per tablet andALPRAZolam (XANAX) 0.5 MG tablet and wants to pick up today, please advise?

## 2014-11-30 NOTE — Telephone Encounter (Signed)
meds are ready

## 2014-12-09 ENCOUNTER — Other Ambulatory Visit: Payer: Self-pay | Admitting: Family Medicine

## 2014-12-15 ENCOUNTER — Ambulatory Visit (INDEPENDENT_AMBULATORY_CARE_PROVIDER_SITE_OTHER): Payer: PPO

## 2014-12-15 VITALS — BP 170/82

## 2014-12-15 DIAGNOSIS — I10 Essential (primary) hypertension: Secondary | ICD-10-CM

## 2014-12-15 DIAGNOSIS — Z23 Encounter for immunization: Secondary | ICD-10-CM

## 2014-12-15 MED ORDER — AMLODIPINE BESYLATE 10 MG PO TABS: 10.0000 mg | ORAL_TABLET | Freq: Every day | ORAL | Status: DC

## 2014-12-15 NOTE — Progress Notes (Signed)
Will increase her amlodipine to 10mg  per dr and to call back with any concerns

## 2014-12-15 NOTE — Patient Instructions (Signed)
Follow up as directed  Increase amlodipine to 10mg  daily. Ok to take 2 of the 5mg  daily until you run out

## 2014-12-23 ENCOUNTER — Other Ambulatory Visit: Payer: Self-pay

## 2014-12-23 DIAGNOSIS — M5090 Cervical disc disorder, unspecified, unspecified cervical region: Secondary | ICD-10-CM

## 2014-12-23 MED ORDER — HYDROCODONE-ACETAMINOPHEN 10-325 MG PO TABS: ORAL_TABLET | ORAL | Status: DC

## 2015-01-12 ENCOUNTER — Other Ambulatory Visit: Payer: Self-pay | Admitting: Family Medicine

## 2015-01-13 ENCOUNTER — Other Ambulatory Visit: Payer: Self-pay | Admitting: Family Medicine

## 2015-01-20 ENCOUNTER — Other Ambulatory Visit: Payer: Self-pay

## 2015-01-20 DIAGNOSIS — M5090 Cervical disc disorder, unspecified, unspecified cervical region: Secondary | ICD-10-CM

## 2015-01-20 MED ORDER — HYDROCODONE-ACETAMINOPHEN 10-325 MG PO TABS: ORAL_TABLET | ORAL | Status: DC

## 2015-02-02 ENCOUNTER — Ambulatory Visit (INDEPENDENT_AMBULATORY_CARE_PROVIDER_SITE_OTHER): Payer: PPO | Admitting: Family Medicine

## 2015-02-02 ENCOUNTER — Encounter: Payer: Self-pay | Admitting: Family Medicine

## 2015-02-02 VITALS — BP 140/74 | HR 91 | Resp 16 | Ht 62.0 in | Wt 137.0 lb

## 2015-02-02 DIAGNOSIS — I10 Essential (primary) hypertension: Secondary | ICD-10-CM

## 2015-02-02 DIAGNOSIS — B009 Herpesviral infection, unspecified: Secondary | ICD-10-CM

## 2015-02-02 DIAGNOSIS — Z1231 Encounter for screening mammogram for malignant neoplasm of breast: Secondary | ICD-10-CM

## 2015-02-02 DIAGNOSIS — I739 Peripheral vascular disease, unspecified: Secondary | ICD-10-CM

## 2015-02-02 DIAGNOSIS — M549 Dorsalgia, unspecified: Secondary | ICD-10-CM

## 2015-02-02 DIAGNOSIS — Z87891 Personal history of nicotine dependence: Secondary | ICD-10-CM | POA: Diagnosis not present

## 2015-02-02 DIAGNOSIS — E1151 Type 2 diabetes mellitus with diabetic peripheral angiopathy without gangrene: Secondary | ICD-10-CM

## 2015-02-02 DIAGNOSIS — F411 Generalized anxiety disorder: Secondary | ICD-10-CM

## 2015-02-02 DIAGNOSIS — E785 Hyperlipidemia, unspecified: Secondary | ICD-10-CM

## 2015-02-02 LAB — HEMOGLOBIN A1C
Hgb A1c MFr Bld: 6.5 % — ABNORMAL HIGH (ref <5.7)
Mean Plasma Glucose: 140 mg/dL — ABNORMAL HIGH (ref <117)

## 2015-02-02 NOTE — Patient Instructions (Signed)
F/u in 4 month, call if you need me sooner  Labs today  You NEED mammogram and chest scan, PLEASE keep appts  Foot exam today shows reduced sensation and circulation, you are referred  To vascular specialist in [facility] to re evaluate your circulation  No changes in medication doses at this time

## 2015-02-02 NOTE — Progress Notes (Signed)
Subjective:    Patient ID: Carla [name], female    DOB: 1946-03-05, 69 y.o.   MRN: [id number]  HPI   Carla [name]     MRN: [id number]      DOB: 1945/05/14   HPI Carla [name] is here for follow up and re-evaluation of chronic medical conditions, medication management and review of any available recent lab and radiology data.  Preventive health is updated, specifically  Cancer screening and Immunization.   . The PT denies any adverse reactions to current medications since the last visit.  C/o increased and back and lower extremity pain C/o increased stress and anxiety with new dx of hSV2 infection  ROS Denies recent fever or chills. Denies sinus pressure, nasal congestion, ear pain or sore throat. Denies chest congestion, productive cough or wheezing. Denies chest pains, palpitations and leg swelling Denies abdominal pain, nausea, vomiting,diarrhea or constipation.   Denies dysuria, frequency, hesitancy or incontinence.  Denies headaches, seizures, numbness, or tingling.  Denies skin break down or rash.   PE  BP 140/74 mmHg  Pulse 91  Resp 16  Ht 5\' 2"  (1.575 m)  Wt 137 lb (62.143 kg)  BMI 25.05 kg/m2  SpO2 98%  Patient alert and oriented and in no cardiopulmonary distress.  HEENT: No facial asymmetry, EOMI,   oropharynx pink and moist.  Neck supple no JVD, no mass.  Chest: Clear to auscultation bilaterally.  CVS: S1, S2 no murmurs, no S3.Regular rate.  ABD: Soft non tender.   Ext: No edema  MS: Adequate though reduced  ROM spine normal in  shoulders, hips and knees.  Skin: Intact, no ulcerations or rash noted.  Psych: Good eye contact, tearful  affect. Memory intact  anxious and  depressed appearing.  CNS: CN 2-12 intact, power,  normal throughout.no focal deficits noted.   Assessment & Plan   Well controlled type 2 diabetes mellitus with peripheral circulatory disorder Controlled, no change in management Carla [name] is reminded of the  importance of commitment to daily physical activity for 30 minutes or more, as able and the need to limit carbohydrate intake to 30 to 60 grams per meal to help with blood sugar control.    Carla [name] is reminded of the importance of daily foot exam, annual eye examination, and good blood sugar, blood pressure and cholesterol control.  Diabetic Labs Latest Ref Rng 02/02/2015 08/01/2014 06/15/2014 04/05/2014 11/27/2013  HbA1c <5.7 % 6.5(H) 6.6(H) - 6.4(H) -  Microalbumin <2.0 mg/dL - - 2.4(H) - -  Micro/Creat Ratio 0.0 - 30.0 mg/g - - 17.2 - -  Chol 0 - 200 mg/dL - 149 - 230(H) -  HDL >=46 mg/dL - 49 - 46 -  Calc LDL 0 - 99 mg/dL - 56 - 122(H) -  Triglycerides <150 mg/dL - 219(H) - 311(H) -  Creatinine 0.50 - 0.99 mg/dL 0.98 0.96 - 0.92 1.10   BP/Weight 02/02/2015 12/15/2014 11/01/2014 08/17/2014 06/15/2014 [id number]  Systolic BP XX123456 [id number] 123456 0000000 123456 [id number] [id number]  Diastolic BP 74 82 80 78 72 78 80  Wt. (Lbs) 137 - - 134 137.04 139.8 139  BMI 25.05 - - 24.5 25.06 25.56 24.63   Foot/eye exam completion dates Latest Ref Rng 02/02/2015 04/28/2014  Eye Exam No Retinopathy - No Retinopathy  Foot Form Completion - Done -         Back pain with radiation Reports worsened ;lower extremity pain , will have vascular re evaluate her as she has significant disease  and has had bilateral stent  placement  GAD (generalized anxiety disorder) Tearful and stressed about recent hSV 2 diagnosis, needs to seek therapy as she has internalized this and takes very personally  PERIPHERAL VASCULAR DISEASE Needs re evaluation of lower extremeties by vascular surgery,mincreased lower ext pain  Essential hypertension Controlled, no change in medication DASH diet and commitment to daily physical activity for a minimum of 30 minutes discussed and encouraged, as a part of hypertension management. The importance of attaining a healthy weight is also discussed.  BP/Weight 02/02/2015 12/15/2014 11/01/2014 08/17/2014  06/15/2014 [id number]  Systolic BP XX123456 123XX123 123456 0000000 123456 XX123456 Q000111Q  Diastolic BP 74 82 80 78 72 78 80  Wt. (Lbs) 137 - - 134 137.04 139.8 139  BMI 25.05 - - 24.5 25.06 25.56 24.63        Hyperlipidemia LDL goal <100 Elevated TG, needs to lower fat intake Updated lab needed at/ before next visit. Hyperlipidemia:Low fat diet discussed and encouraged.   Lipid Panel  Lab Results  Component Value Date   CHOL 149 08/01/2014   HDL 49 08/01/2014   LDLCALC 56 08/01/2014   LDLDIRECT 166* 10/11/2009   TRIG 219* 08/01/2014   CHOLHDL 3.0 08/01/2014        Western blot positive HSV2 Current sourse of stress , anxiety and depression which will leessen over time, dx is new , within the past 3 months, and pt is totally unaccepting of the implication       Review of Systems     Objective:   Physical Exam        Assessment & Plan:

## 2015-02-03 LAB — COMPLETE METABOLIC PANEL WITH GFR
ALBUMIN: 4.7 g/dL (ref 3.6–5.1)
ALK PHOS: 85 U/L (ref 33–130)
ALT: 25 U/L (ref 6–29)
AST: 30 U/L (ref 10–35)
BUN: 11 mg/dL (ref 7–25)
CO2: 27 mmol/L (ref 20–31)
Calcium: 11.1 mg/dL — ABNORMAL HIGH (ref 8.6–10.4)
Chloride: 103 mmol/L (ref 98–110)
Creat: 0.98 mg/dL (ref 0.50–0.99)
GFR, Est African American: 68 mL/min (ref >=60)
GFR, Est Non African American: 59 mL/min — ABNORMAL LOW (ref >=60)
GLUCOSE: 95 mg/dL (ref 65–99)
POTASSIUM: 4.4 mmol/L (ref 3.5–5.3)
SODIUM: 140 mmol/L (ref 135–146)
TOTAL PROTEIN: 7.6 g/dL (ref 6.1–8.1)
Total Bilirubin: 0.5 mg/dL (ref 0.2–1.2)

## 2015-02-08 ENCOUNTER — Ambulatory Visit (HOSPITAL_COMMUNITY)
Admission: RE | Admit: 2015-02-08 | Discharge: 2015-02-08 | Disposition: A | Discharge status: Home / Self Care | Payer: PPO | Source: Ambulatory Visit | Attending: Family Medicine | Admitting: Family Medicine

## 2015-02-08 DIAGNOSIS — Z87891 Personal history of nicotine dependence: Secondary | ICD-10-CM | POA: Diagnosis not present

## 2015-02-08 DIAGNOSIS — Z122 Encounter for screening for malignant neoplasm of respiratory organs: Secondary | ICD-10-CM | POA: Diagnosis present

## 2015-02-12 DIAGNOSIS — B009 Herpesviral infection, unspecified: Secondary | ICD-10-CM | POA: Insufficient documentation

## 2015-02-12 HISTORY — DX: Herpesviral infection, unspecified: B00.9

## 2015-02-12 NOTE — Assessment & Plan Note (Signed)
Controlled, no change in medication DASH diet and commitment to daily physical activity for a minimum of 30 minutes discussed and encouraged, as a part of hypertension management. The importance of attaining a healthy weight is also discussed.  BP/Weight 02/02/2015 12/15/2014 11/01/2014 08/17/2014 06/15/2014 [id number]  Systolic BP XX123456 123XX123 123456 0000000 123456 XX123456 Q000111Q  Diastolic BP 74 82 80 78 72 78 80  Wt. (Lbs) 137 - - 134 137.04 139.8 139  BMI 25.05 - - 24.5 25.06 25.56 24.63

## 2015-02-12 NOTE — Assessment & Plan Note (Signed)
Current sourse of stress , anxiety and depression which will leessen over time, dx is new , within the past 3 months, and pt is totally unaccepting of the implication

## 2015-02-12 NOTE — Assessment & Plan Note (Signed)
Elevated TG, needs to lower fat intake Updated lab needed at/ before next visit. Hyperlipidemia:Low fat diet discussed and encouraged.   Lipid Panel  Lab Results  Component Value Date   CHOL 149 08/01/2014   HDL 49 08/01/2014   LDLCALC 56 08/01/2014   LDLDIRECT 166* 10/11/2009   TRIG 219* 08/01/2014   CHOLHDL 3.0 08/01/2014

## 2015-02-12 NOTE — Assessment & Plan Note (Signed)
Reports worsened ;lower extremity pain , will have vascular re evaluate her as she has significant disease and has had bilateral stent  placement

## 2015-02-12 NOTE — Assessment & Plan Note (Signed)
Controlled, no change in management Carla [name] is reminded of the importance of commitment to daily physical activity for 30 minutes or more, as able and the need to limit carbohydrate intake to 30 to 60 grams per meal to help with blood sugar control.    Carla [name] is reminded of the importance of daily foot exam, annual eye examination, and good blood sugar, blood pressure and cholesterol control.  Diabetic Labs Latest Ref Rng 02/02/2015 08/01/2014 06/15/2014 04/05/2014 11/27/2013  HbA1c <5.7 % 6.5(H) 6.6(H) - 6.4(H) -  Microalbumin <2.0 mg/dL - - 2.4(H) - -  Micro/Creat Ratio 0.0 - 30.0 mg/g - - 17.2 - -  Chol 0 - 200 mg/dL - 149 - 230(H) -  HDL >=46 mg/dL - 49 - 46 -  Calc LDL 0 - 99 mg/dL - 56 - 122(H) -  Triglycerides <150 mg/dL - 219(H) - 311(H) -  Creatinine 0.50 - 0.99 mg/dL 0.98 0.96 - 0.92 1.10   BP/Weight 02/02/2015 12/15/2014 11/01/2014 08/17/2014 06/15/2014 [id number]  Systolic BP XX123456 123XX123 123456 0000000 123456 XX123456 Q000111Q  Diastolic BP 74 82 80 78 72 78 80  Wt. (Lbs) 137 - - 134 137.04 139.8 139  BMI 25.05 - - 24.5 25.06 25.56 24.63   Foot/eye exam completion dates Latest Ref Rng 02/02/2015 04/28/2014  Eye Exam No Retinopathy - No Retinopathy  Foot Form Completion - Done -

## 2015-02-12 NOTE — Assessment & Plan Note (Signed)
Needs re evaluation of lower extremeties by vascular surgery,mincreased lower ext pain

## 2015-02-12 NOTE — Assessment & Plan Note (Signed)
Tearful and stressed about recent hSV 2 diagnosis, needs to seek therapy as she has internalized this and takes very personally

## 2015-02-13 ENCOUNTER — Other Ambulatory Visit: Payer: Self-pay | Admitting: Family Medicine

## 2015-02-13 DIAGNOSIS — Z1231 Encounter for screening mammogram for malignant neoplasm of breast: Secondary | ICD-10-CM

## 2015-02-20 ENCOUNTER — Other Ambulatory Visit: Payer: Self-pay

## 2015-02-20 ENCOUNTER — Ambulatory Visit (HOSPITAL_COMMUNITY): Payer: PPO

## 2015-02-20 DIAGNOSIS — M5090 Cervical disc disorder, unspecified, unspecified cervical region: Secondary | ICD-10-CM

## 2015-02-20 MED ORDER — HYDROCODONE-ACETAMINOPHEN 10-325 MG PO TABS: ORAL_TABLET | ORAL | Status: DC

## 2015-03-08 ENCOUNTER — Telehealth: Payer: Self-pay | Admitting: Family Medicine

## 2015-03-08 MED ORDER — BENZONATATE 100 MG PO CAPS: 100.0000 mg | ORAL_CAPSULE | Freq: Two times a day (BID) | ORAL | Status: DC | PRN

## 2015-03-08 NOTE — Addendum Note (Signed)
Addended by: Denman George B on: 03/08/2015 04:46 PM   Modules accepted: Orders

## 2015-03-08 NOTE — Telephone Encounter (Signed)
Patient aware and medication sent to pharmacy #[id number] that is on $4 list at [facility]

## 2015-03-08 NOTE — Telephone Encounter (Signed)
Patient complaining of non productive cough x 1 week.  No fever.  Do you agree with tessalon perles?

## 2015-03-08 NOTE — Telephone Encounter (Signed)
Patient is asking if Dr. Moshe Cipro would call her in something for cough, congestion and hoarseness, please advise?

## 2015-03-08 NOTE — Telephone Encounter (Signed)
Yes [pls send #20

## 2015-03-21 ENCOUNTER — Other Ambulatory Visit: Payer: Self-pay | Admitting: Family Medicine

## 2015-03-22 ENCOUNTER — Other Ambulatory Visit: Payer: Self-pay

## 2015-03-22 DIAGNOSIS — M5090 Cervical disc disorder, unspecified, unspecified cervical region: Secondary | ICD-10-CM

## 2015-03-22 MED ORDER — HYDROCODONE-ACETAMINOPHEN 10-325 MG PO TABS: ORAL_TABLET | ORAL | Status: DC

## 2015-04-12 ENCOUNTER — Other Ambulatory Visit: Payer: Self-pay

## 2015-04-12 DIAGNOSIS — M5090 Cervical disc disorder, unspecified, unspecified cervical region: Secondary | ICD-10-CM

## 2015-04-12 MED ORDER — HYDROCODONE-ACETAMINOPHEN 10-325 MG PO TABS: ORAL_TABLET | ORAL | Status: DC

## 2015-04-14 ENCOUNTER — Other Ambulatory Visit: Payer: Self-pay | Admitting: Family Medicine

## 2015-04-24 ENCOUNTER — Other Ambulatory Visit: Payer: Self-pay | Admitting: Family Medicine

## 2015-05-01 ENCOUNTER — Encounter: Payer: Self-pay | Admitting: Family Medicine

## 2015-05-01 ENCOUNTER — Other Ambulatory Visit: Payer: Self-pay | Admitting: Family Medicine

## 2015-05-01 ENCOUNTER — Ambulatory Visit (INDEPENDENT_AMBULATORY_CARE_PROVIDER_SITE_OTHER): Payer: PPO | Admitting: Family Medicine

## 2015-05-01 VITALS — BP 120/80 | HR 76 | Resp 18 | Ht 62.0 in | Wt 138.1 lb

## 2015-05-01 DIAGNOSIS — E210 Primary hyperparathyroidism: Secondary | ICD-10-CM

## 2015-05-01 DIAGNOSIS — E559 Vitamin D deficiency, unspecified: Secondary | ICD-10-CM

## 2015-05-01 DIAGNOSIS — E785 Hyperlipidemia, unspecified: Secondary | ICD-10-CM | POA: Diagnosis not present

## 2015-05-01 DIAGNOSIS — I10 Essential (primary) hypertension: Secondary | ICD-10-CM

## 2015-05-01 DIAGNOSIS — M5090 Cervical disc disorder, unspecified, unspecified cervical region: Secondary | ICD-10-CM | POA: Diagnosis not present

## 2015-05-01 DIAGNOSIS — Z1231 Encounter for screening mammogram for malignant neoplasm of breast: Secondary | ICD-10-CM

## 2015-05-01 DIAGNOSIS — Z1159 Encounter for screening for other viral diseases: Secondary | ICD-10-CM

## 2015-05-01 DIAGNOSIS — F411 Generalized anxiety disorder: Secondary | ICD-10-CM

## 2015-05-01 DIAGNOSIS — M549 Dorsalgia, unspecified: Secondary | ICD-10-CM

## 2015-05-01 DIAGNOSIS — E1151 Type 2 diabetes mellitus with diabetic peripheral angiopathy without gangrene: Secondary | ICD-10-CM

## 2015-05-01 LAB — CBC
HCT: 45.4 % (ref 36.0–46.0)
Hemoglobin: 15.1 g/dL — ABNORMAL HIGH (ref 12.0–15.0)
MCH: 30.9 pg (ref 26.0–34.0)
MCHC: 33.3 g/dL (ref 30.0–36.0)
MCV: 93 fL (ref 78.0–100.0)
MPV: 9.8 fL (ref 8.6–12.4)
PLATELETS: 290 10*3/uL (ref 150–400)
RBC: 4.88 MIL/uL (ref 3.87–5.11)
RDW: 14.4 % (ref 11.5–15.5)
WBC: 8.1 10*3/uL (ref 4.0–10.5)

## 2015-05-01 LAB — COMPLETE METABOLIC PANEL WITH GFR
ALBUMIN: 4.6 g/dL (ref 3.6–5.1)
ALT: 39 U/L — AB (ref 6–29)
AST: 41 U/L — ABNORMAL HIGH (ref 10–35)
Alkaline Phosphatase: 75 U/L (ref 33–130)
BILIRUBIN TOTAL: 0.5 mg/dL (ref 0.2–1.2)
BUN: 10 mg/dL (ref 7–25)
CALCIUM: 9.9 mg/dL (ref 8.6–10.4)
CO2: 27 mmol/L (ref 20–31)
CREATININE: 0.87 mg/dL (ref 0.60–0.93)
Chloride: 103 mmol/L (ref 98–110)
GFR, EST AFRICAN AMERICAN: 78 mL/min (ref >=60)
GFR, Est Non African American: 68 mL/min (ref >=60)
Glucose, Bld: 109 mg/dL — ABNORMAL HIGH (ref 65–99)
Potassium: 4.1 mmol/L (ref 3.5–5.3)
Sodium: 141 mmol/L (ref 135–146)
TOTAL PROTEIN: 7.4 g/dL (ref 6.1–8.1)

## 2015-05-01 LAB — LIPID PANEL
Cholesterol: 159 mg/dL (ref 125–200)
HDL: 55 mg/dL (ref >=46)
LDL CALC: 64 mg/dL (ref <130)
TRIGLYCERIDES: 200 mg/dL — AB (ref <150)
Total CHOL/HDL Ratio: 2.9 Ratio (ref <=5.0)
VLDL: 40 mg/dL — ABNORMAL HIGH (ref <30)

## 2015-05-01 LAB — TSH: TSH: 1.63 mIU/L

## 2015-05-01 MED ORDER — GABAPENTIN 100 MG PO CAPS: 100.0000 mg | ORAL_CAPSULE | Freq: Three times a day (TID) | ORAL | Status: DC

## 2015-05-01 NOTE — Patient Instructions (Signed)
Annual exam in 4.5 month, call if you need me sooner    New for management of pain due to nerve irritation is gabapentin 100mg .  Start one capsule at bedtime, after 3 to 5 days increase , if needed, to two capsules at bedtime, and after an additional 5 days to 3 capsules at bedtime if needed , for pain control.  Please note, the script is written as one three times daily, DO NOT take like this, instead take only at bedtime as above.  Labs todday  Schedule mammogram today pls go to hospital to make appt , this is past due!  You are referred to Dr Gershon Crane for eye exam, make and keep appt pls

## 2015-05-01 NOTE — Progress Notes (Signed)
Subjective:    Patient ID: Carla [name], female    DOB: 07/06/1945, 70 y.o.   MRN: [id number]  HPI   SHRADDHA HORRY     MRN: [id number]      DOB: 1945/10/29   HPI Carla [name] is here for follow up and re-evaluation of chronic medical conditions, medication management and review of any available recent lab and radiology data.  Preventive health is updated, specifically  Cancer screening and Immunization.   Questions or concerns regarding consultations or procedures which the PT has had in the interim are  addressed. The PT denies any adverse reactions to current medications since the last visit.  C/o increased neck and back pain, no new trauma  ROS Denies recent fever or chills. Denies sinus pressure, nasal congestion, ear pain or sore throat. Denies chest congestion, productive cough or wheezing. Denies chest pains, palpitations and leg swelling Denies abdominal pain, nausea, vomiting,diarrhea or constipation.   Denies dysuria, frequency, hesitancy or incontinence. . Denies headaches, seizures, numbness, or tingling. Denies depression, anxiety or insomnia. Denies skin break down or rash.   PE  BP 120/80 mmHg  Pulse 76  Resp 18  Ht 5\' 2"  (1.575 m)  Wt 138 lb 1.9 oz (62.651 kg)  BMI 25.26 kg/m2  SpO2 94%  Patient alert and oriented and in no cardiopulmonary distress.  HEENT: No facial asymmetry, EOMI,   oropharynx pink and moist.  Neck decreased ROM no JVD, no mass.  Chest: Clear to auscultation bilaterally.  CVS: S1, S2 no murmurs, no S3.Regular rate.  ABD: Soft non tender.   Ext: No edema  MS: decreased  ROM spine, shoulders, hips and knees.  Skin: Intact, no ulcerations or rash noted.  Psych: Good eye contact, normal affect. Memory intact not anxious or depressed appearing.  CNS: CN 2-12 intact, power,  normal throughout.no focal deficits noted.   Assessment & Plan   Cervical neck pain with evidence of disc disease Uncontrolled star gabapentin  at bedtime , titrate up as tolerated  Back pain with radiation No change in current meds, add gabapentin  Hyperlipidemia LDL goal <100 Hyperlipidemia:Low fat diet discussed and encouraged.   Lipid Panel  Lab Results  Component Value Date   CHOL 159 05/01/2015   HDL 55 05/01/2015   LDLCALC 64 05/01/2015   LDLDIRECT 166* 10/11/2009   TRIG 200* 05/01/2015   CHOLHDL 2.9 05/01/2015   Uncontrolled need to change diet and commit to medication     GAD (generalized anxiety disorder) Improved, continue current med  Essential hypertension Controlled, no change in medication DASH diet and commitment to daily physical activity for a minimum of 30 minutes discussed and encouraged, as a part of hypertension management. The importance of attaining a healthy weight is also discussed.  BP/Weight 05/01/2015 02/02/2015 12/15/2014 11/01/2014 08/17/2014 06/15/2014 [id number]  Systolic BP 123456 XX123456 123XX123 123456 0000000 123456 XX123456  Diastolic BP 80 74 82 80 78 72 78  Wt. (Lbs) 138.12 137 - - 134 137.04 139.8  BMI 25.26 25.05 - - 24.5 25.06 25.56        Well controlled type 2 diabetes mellitus with peripheral circulatory disorder Carla [name] is reminded of the importance of commitment to daily physical activity for 30 minutes or more, as able and the need to limit carbohydrate intake to 30 to 60 grams per meal to help with blood sugar control.   The need to take medication as prescribed, test blood sugar as directed, and to call between visits if  there is a concern that blood sugar is uncontrolled is also discussed.   Carla [name] is reminded of the importance of daily foot exam, annual eye examination, and good blood sugar, blood pressure and cholesterol control. Needs eye exam  Diabetic Labs Latest Ref Rng 05/01/2015 02/02/2015 08/01/2014 06/15/2014 04/05/2014  HbA1c <5.7 % - 6.5(H) 6.6(H) - 6.4(H)  Microalbumin <2.0 mg/dL - - - 2.4(H) -  Micro/Creat Ratio 0.0 - 30.0 mg/g - - - 17.2 -  Chol 125 - 200 mg/dL 159 -  149 - 230(H)  HDL >=46 mg/dL 55 - 49 - 46  Calc LDL <130 mg/dL 64 - 56 - 122(H)  Triglycerides <150 mg/dL 200(H) - 219(H) - 311(H)  Creatinine 0.60 - 0.93 mg/dL 0.87 0.98 0.96 - 0.92   BP/Weight 05/01/2015 02/02/2015 12/15/2014 11/01/2014 08/17/2014 06/15/2014 [id number]  Systolic BP 123456 XX123456 123XX123 123456 0000000 123456 XX123456  Diastolic BP 80 74 82 80 78 72 78  Wt. (Lbs) 138.12 137 - - 134 137.04 139.8  BMI 25.26 25.05 - - 24.5 25.06 25.56   Foot/eye exam completion dates Latest Ref Rng 02/02/2015 04/28/2014  Eye Exam No Retinopathy - No Retinopathy  Foot Form Completion - Done -              Review of Systems     Objective:   Physical Exam        Assessment & Plan:

## 2015-05-02 ENCOUNTER — Other Ambulatory Visit: Payer: Self-pay

## 2015-05-02 DIAGNOSIS — M5090 Cervical disc disorder, unspecified, unspecified cervical region: Secondary | ICD-10-CM

## 2015-05-02 LAB — VITAMIN D 25 HYDROXY (VIT D DEFICIENCY, FRACTURES): Vit D, 25-Hydroxy: 45 ng/mL (ref 30–100)

## 2015-05-02 LAB — HEPATITIS C ANTIBODY: HCV Ab: NEGATIVE

## 2015-05-02 MED ORDER — HYDROCODONE-ACETAMINOPHEN 10-325 MG PO TABS: ORAL_TABLET | ORAL | Status: AC

## 2015-05-04 ENCOUNTER — Inpatient Hospital Stay (HOSPITAL_COMMUNITY): Admission: RE | Admit: 2015-05-04 | Payer: PPO | Source: Ambulatory Visit

## 2015-05-08 ENCOUNTER — Telehealth: Payer: Self-pay | Admitting: Family Medicine

## 2015-05-08 NOTE — Assessment & Plan Note (Signed)
Controlled, no change in medication DASH diet and commitment to daily physical activity for a minimum of 30 minutes discussed and encouraged, as a part of hypertension management. The importance of attaining a healthy weight is also discussed.  BP/Weight 05/01/2015 02/02/2015 12/15/2014 11/01/2014 08/17/2014 06/15/2014 [id number]  Systolic BP 123456 XX123456 123XX123 123456 0000000 123456 XX123456  Diastolic BP 80 74 82 80 78 72 78  Wt. (Lbs) 138.12 137 - - 134 137.04 139.8  BMI 25.26 25.05 - - 24.5 25.06 25.56

## 2015-05-08 NOTE — Assessment & Plan Note (Signed)
Hyperlipidemia:Low fat diet discussed and encouraged.   Lipid Panel  Lab Results  Component Value Date   CHOL 159 05/01/2015   HDL 55 05/01/2015   LDLCALC 64 05/01/2015   LDLDIRECT 166* 10/11/2009   TRIG 200* 05/01/2015   CHOLHDL 2.9 05/01/2015   Uncontrolled need to change diet and commit to medication

## 2015-05-08 NOTE — Assessment & Plan Note (Signed)
Ms. Brieger is reminded of the importance of commitment to daily physical activity for 30 minutes or more, as able and the need to limit carbohydrate intake to 30 to 60 grams per meal to help with blood sugar control.   The need to take medication as prescribed, test blood sugar as directed, and to call between visits if there is a concern that blood sugar is uncontrolled is also discussed.   Ms. Henion is reminded of the importance of daily foot exam, annual eye examination, and good blood sugar, blood pressure and cholesterol control. Needs eye exam  Diabetic Labs Latest Ref Rng 05/01/2015 02/02/2015 08/01/2014 06/15/2014 04/05/2014  HbA1c <5.7 % - 6.5(H) 6.6(H) - 6.4(H)  Microalbumin <2.0 mg/dL - - - 2.4(H) -  Micro/Creat Ratio 0.0 - 30.0 mg/g - - - 17.2 -  Chol 125 - 200 mg/dL 159 - 149 - 230(H)  HDL >=46 mg/dL 55 - 49 - 46  Calc LDL <130 mg/dL 64 - 56 - 122(H)  Triglycerides <150 mg/dL 200(H) - 219(H) - 311(H)  Creatinine 0.60 - 0.93 mg/dL 0.87 0.98 0.96 - 0.92   BP/Weight 05/01/2015 02/02/2015 12/15/2014 11/01/2014 08/17/2014 06/15/2014 [id number]  Systolic BP 123456 XX123456 123XX123 123456 0000000 123456 XX123456  Diastolic BP 80 74 82 80 78 72 78  Wt. (Lbs) 138.12 137 - - 134 137.04 139.8  BMI 25.26 25.05 - - 24.5 25.06 25.56   Foot/eye exam completion dates Latest Ref Rng 02/02/2015 04/28/2014  Eye Exam No Retinopathy - No Retinopathy  Foot Form Completion - Done -

## 2015-05-08 NOTE — Assessment & Plan Note (Signed)
Improved, continue current med 

## 2015-05-08 NOTE — Telephone Encounter (Signed)
Patient states that she cant take the gabapentin (NEURONTIN) 100 MG capsule  Please advise?

## 2015-05-08 NOTE — Telephone Encounter (Signed)
States it makes her sick on her stomach and causes headaches States she is going to stop the medication and call back when Dr returns if she is still having the pain to see if anything else can be tried

## 2015-05-08 NOTE — Assessment & Plan Note (Signed)
No change in current meds, add gabapentin

## 2015-05-08 NOTE — Assessment & Plan Note (Signed)
Uncontrolled star gabapentin at bedtime , titrate up as tolerated

## 2015-05-09 NOTE — Addendum Note (Signed)
Addended by: Denman George B on: 05/09/2015 09:02 AM   Modules accepted: Orders

## 2015-05-24 ENCOUNTER — Telehealth: Payer: Self-pay | Admitting: Family Medicine

## 2015-05-24 NOTE — Telephone Encounter (Signed)
Patient is stating that her left foot is swollen and burning and stinging from the ankle up to almost her knee, she stated that she couldn't take the medication that was prescribed before it made he sick, she is asking if Dr. Moshe Cipro could call her in something else

## 2015-05-25 ENCOUNTER — Other Ambulatory Visit: Payer: Self-pay

## 2015-05-25 MED ORDER — PREGABALIN 25 MG PO CAPS: 25.0000 mg | ORAL_CAPSULE | Freq: Every day | ORAL | Status: DC

## 2015-05-25 NOTE — Telephone Encounter (Signed)
Patient aware and medication sent to pharmacy.  She does not want to go to pain clinic.

## 2015-05-25 NOTE — Telephone Encounter (Signed)
Patient is asking is there any alternative for Gabapentin.  States that it caused her to be nauseous and have headaches.  Please advise.

## 2015-05-25 NOTE — Telephone Encounter (Addendum)
I recommend trial of lyrica and will enter , if this does not work , I recommend pain specialist eval Francesco Runner) as her symptoms are due to arhtritis in her spine causing nerve irritation Lyrica entered

## 2015-05-30 ENCOUNTER — Other Ambulatory Visit: Payer: Self-pay

## 2015-05-30 ENCOUNTER — Emergency Department (HOSPITAL_COMMUNITY): Payer: PPO

## 2015-05-30 ENCOUNTER — Encounter (HOSPITAL_COMMUNITY): Payer: Self-pay | Admitting: Emergency Medicine

## 2015-05-30 ENCOUNTER — Telehealth: Payer: Self-pay | Admitting: Family Medicine

## 2015-05-30 ENCOUNTER — Observation Stay (HOSPITAL_COMMUNITY)
Admission: EM | Admit: 2015-05-30 | Discharge: 2015-05-31 | Disposition: A | Discharge status: Home / Self Care | Payer: PPO | Attending: Internal Medicine | Admitting: Internal Medicine

## 2015-05-30 DIAGNOSIS — I739 Peripheral vascular disease, unspecified: Secondary | ICD-10-CM | POA: Insufficient documentation

## 2015-05-30 DIAGNOSIS — I10 Essential (primary) hypertension: Secondary | ICD-10-CM | POA: Diagnosis present

## 2015-05-30 DIAGNOSIS — Z87891 Personal history of nicotine dependence: Secondary | ICD-10-CM | POA: Insufficient documentation

## 2015-05-30 DIAGNOSIS — I2510 Atherosclerotic heart disease of native coronary artery without angina pectoris: Secondary | ICD-10-CM | POA: Insufficient documentation

## 2015-05-30 DIAGNOSIS — F329 Major depressive disorder, single episode, unspecified: Secondary | ICD-10-CM | POA: Insufficient documentation

## 2015-05-30 DIAGNOSIS — E785 Hyperlipidemia, unspecified: Secondary | ICD-10-CM | POA: Diagnosis present

## 2015-05-30 DIAGNOSIS — E1151 Type 2 diabetes mellitus with diabetic peripheral angiopathy without gangrene: Secondary | ICD-10-CM | POA: Diagnosis present

## 2015-05-30 DIAGNOSIS — Z79899 Other long term (current) drug therapy: Secondary | ICD-10-CM | POA: Insufficient documentation

## 2015-05-30 DIAGNOSIS — R079 Chest pain, unspecified: Secondary | ICD-10-CM | POA: Diagnosis not present

## 2015-05-30 DIAGNOSIS — F411 Generalized anxiety disorder: Secondary | ICD-10-CM | POA: Diagnosis not present

## 2015-05-30 DIAGNOSIS — R0789 Other chest pain: Secondary | ICD-10-CM | POA: Diagnosis not present

## 2015-05-30 LAB — CBC WITH DIFFERENTIAL/PLATELET
Basophils Absolute: 0.1 10*3/uL (ref 0.0–0.1)
Basophils Relative: 1 %
EOS ABS: 0.3 10*3/uL (ref 0.0–0.7)
EOS PCT: 2 %
HCT: 46.9 % — ABNORMAL HIGH (ref 36.0–46.0)
Hemoglobin: 16.1 g/dL — ABNORMAL HIGH (ref 12.0–15.0)
LYMPHS ABS: 4.4 10*3/uL — AB (ref 0.7–4.0)
Lymphocytes Relative: 38 %
MCH: 32.1 pg (ref 26.0–34.0)
MCHC: 34.3 g/dL (ref 30.0–36.0)
MCV: 93.4 fL (ref 78.0–100.0)
MONOS PCT: 7 %
Monocytes Absolute: 0.9 10*3/uL (ref 0.1–1.0)
Neutro Abs: 5.8 10*3/uL (ref 1.7–7.7)
Neutrophils Relative %: 51 %
PLATELETS: 326 10*3/uL (ref 150–400)
RBC: 5.02 MIL/uL (ref 3.87–5.11)
RDW: 13.9 % (ref 11.5–15.5)
WBC: 11.5 10*3/uL — AB (ref 4.0–10.5)

## 2015-05-30 LAB — BASIC METABOLIC PANEL
Anion gap: 12 (ref 5–15)
BUN: 14 mg/dL (ref 6–20)
CALCIUM: 10.2 mg/dL (ref 8.9–10.3)
CHLORIDE: 102 mmol/L (ref 101–111)
CO2: 24 mmol/L (ref 22–32)
CREATININE: 1.09 mg/dL — AB (ref 0.44–1.00)
GFR calc non Af Amer: 50 mL/min — ABNORMAL LOW (ref >60)
GFR, EST AFRICAN AMERICAN: 58 mL/min — AB (ref >60)
GLUCOSE: 133 mg/dL — AB (ref 65–99)
Potassium: 3.4 mmol/L — ABNORMAL LOW (ref 3.5–5.1)
Sodium: 138 mmol/L (ref 135–145)

## 2015-05-30 LAB — TROPONIN I: Troponin I: <0.03 ng/mL (ref <0.031)

## 2015-05-30 MED ORDER — PROMETHAZINE-DM 6.25-15 MG/5ML PO SYRP: ORAL_SOLUTION | ORAL | Status: DC

## 2015-05-30 MED ORDER — AMLODIPINE BESYLATE 5 MG PO TABS
10.0000 mg | ORAL_TABLET | Freq: Every day | ORAL | Status: DC
  Administered 2015-05-30 – 2015-05-31 (×2): 10 mg via ORAL
  Filled 2015-05-30 (×2): qty 2

## 2015-05-30 MED ORDER — POTASSIUM CHLORIDE CRYS ER 10 MEQ PO TBCR
10.0000 meq | EXTENDED_RELEASE_TABLET | Freq: Every day | ORAL | Status: DC
  Administered 2015-05-30 – 2015-05-31 (×2): 10 meq via ORAL
  Filled 2015-05-30 (×2): qty 1

## 2015-05-30 MED ORDER — GUAIFENESIN ER 600 MG PO TB12
600.0000 mg | ORAL_TABLET | Freq: Two times a day (BID) | ORAL | Status: DC
Start: 2015-05-30 — End: 2015-05-31
  Administered 2015-05-30 – 2015-05-31 (×2): 600 mg via ORAL
  Filled 2015-05-30 (×2): qty 1

## 2015-05-30 MED ORDER — VITAMIN D 1000 UNITS PO TABS
2000.0000 [IU] | ORAL_TABLET | Freq: Every day | ORAL | Status: DC
  Administered 2015-05-30 – 2015-05-31 (×2): 2000 [IU] via ORAL
  Filled 2015-05-30 (×2): qty 2

## 2015-05-30 MED ORDER — ADULT MULTIVITAMIN W/MINERALS CH
1.0000 | ORAL_TABLET | Freq: Every evening | ORAL | Status: DC
Start: 2015-05-30 — End: 2015-05-31
  Administered 2015-05-30: 1 via ORAL
  Filled 2015-05-30: qty 1

## 2015-05-30 MED ORDER — ASPIRIN 81 MG PO CHEW
324.0000 mg | CHEWABLE_TABLET | Freq: Once | ORAL | Status: AC
  Administered 2015-05-30: 324 mg via ORAL
  Filled 2015-05-30: qty 4

## 2015-05-30 MED ORDER — GI COCKTAIL ~~LOC~~
30.0000 mL | Freq: Four times a day (QID) | ORAL | Status: DC | PRN
  Administered 2015-05-31: 30 mL via ORAL
  Filled 2015-05-30: qty 30

## 2015-05-30 MED ORDER — MORPHINE SULFATE (PF) 2 MG/ML IV SOLN: 2.0000 mg | INTRAVENOUS | Status: DC | PRN

## 2015-05-30 MED ORDER — PREDNISONE 10 MG PO TABS
5.0000 mg | ORAL_TABLET | Freq: Two times a day (BID) | ORAL | Status: DC
  Administered 2015-05-30 – 2015-05-31 (×2): 5 mg via ORAL
  Filled 2015-05-30 (×2): qty 1

## 2015-05-30 MED ORDER — POTASSIUM CHLORIDE IN NACL 20-0.9 MEQ/L-% IV SOLN
Freq: Once | INTRAVENOUS | Status: AC
  Administered 2015-05-30: 17:00:00 via INTRAVENOUS

## 2015-05-30 MED ORDER — PREDNISONE 5 MG PO TABS: 5.0000 mg | ORAL_TABLET | Freq: Two times a day (BID) | ORAL | Status: DC

## 2015-05-30 MED ORDER — HYDROCODONE-ACETAMINOPHEN 10-325 MG PO TABS: 1.0000 | ORAL_TABLET | Freq: Three times a day (TID) | ORAL | Status: DC | PRN

## 2015-05-30 MED ORDER — PREDNISONE 5 MG PO TABS: 5.0000 mg | ORAL_TABLET | Freq: Two times a day (BID) | ORAL | Status: AC

## 2015-05-30 MED ORDER — ENOXAPARIN SODIUM 40 MG/0.4ML ~~LOC~~ SOLN
40.0000 mg | SUBCUTANEOUS | Status: DC
  Administered 2015-05-30: 40 mg via SUBCUTANEOUS
  Filled 2015-05-30: qty 0.4

## 2015-05-30 MED ORDER — ONDANSETRON HCL 4 MG/2ML IJ SOLN: 4.0000 mg | Freq: Four times a day (QID) | INTRAMUSCULAR | Status: DC | PRN

## 2015-05-30 MED ORDER — BENZONATATE 100 MG PO CAPS
200.0000 mg | ORAL_CAPSULE | Freq: Two times a day (BID) | ORAL | Status: DC | PRN
  Administered 2015-05-31: 200 mg via ORAL
  Filled 2015-05-30: qty 2

## 2015-05-30 MED ORDER — ACETAMINOPHEN 325 MG PO TABS: 650.0000 mg | ORAL_TABLET | ORAL | Status: DC | PRN

## 2015-05-30 MED ORDER — ROSUVASTATIN CALCIUM 20 MG PO TABS
20.0000 mg | ORAL_TABLET | Freq: Every day | ORAL | Status: DC
  Administered 2015-05-30: 20 mg via ORAL
  Filled 2015-05-30: qty 1

## 2015-05-30 MED ORDER — ALPRAZOLAM 0.5 MG PO TABS
0.5000 mg | ORAL_TABLET | Freq: Two times a day (BID) | ORAL | Status: DC
  Administered 2015-05-30 – 2015-05-31 (×2): 0.5 mg via ORAL
  Filled 2015-05-30 (×2): qty 1

## 2015-05-30 MED ORDER — ACYCLOVIR 800 MG PO TABS
800.0000 mg | ORAL_TABLET | Freq: Two times a day (BID) | ORAL | Status: DC
  Administered 2015-05-30 – 2015-05-31 (×2): 800 mg via ORAL
  Filled 2015-05-30 (×6): qty 1

## 2015-05-30 MED ORDER — DIAZEPAM 5 MG PO TABS
5.0000 mg | ORAL_TABLET | Freq: Two times a day (BID) | ORAL | Status: DC | PRN
  Administered 2015-05-31: 5 mg via ORAL
  Filled 2015-05-30: qty 1

## 2015-05-30 MED ORDER — CYCLOBENZAPRINE HCL 10 MG PO TABS: 10.0000 mg | ORAL_TABLET | Freq: Three times a day (TID) | ORAL | Status: DC | PRN

## 2015-05-30 NOTE — Telephone Encounter (Signed)
Patient is complaining of sinus infection x's 4 days and she is asking for something to be called in

## 2015-05-30 NOTE — Telephone Encounter (Signed)
Patient states that she has had cough/congestion x 4 days.  No noted fever.  C/o scratchy throat.  Advised that she starts daily otc allergy medication like claritin or zyrtec.  She also states that the [employer] do not help much with congestion.  Please advise.  Would like any prescriptions sent to [facility].

## 2015-05-30 NOTE — Telephone Encounter (Signed)
Medication cancelled and sent to [facility].  Patient currently in the ED.  Walked into office c/o acute chest pain.   Advised to go back to the ED.

## 2015-05-30 NOTE — ED Notes (Signed)
Pt states she had a sharp shooting chest pain that went down her left arm earlier today.  Is pain free currently and had the same pain last week without radiation to arm.  Has had cough and congestion for the past week or so.

## 2015-05-30 NOTE — ED Provider Notes (Signed)
CSN: [id number]     Arrival date & time 05/30/15  1434 History   First MD Initiated Contact with Patient 05/30/15 1503     Chief Complaint  Patient presents with  . Chest Pain    Patient is a 70 y.o. female presenting with chest pain. The history is provided by the patient.  Chest Pain Pain location:  L chest Pain quality: sharp   Pain radiates to:  L shoulder and L arm Pain severity:  Moderate Onset quality:  Sudden Duration:  3 minutes Timing:  Intermittent Progression:  Worsening Chronicity:  New Relieved by:  None tried Worsened by:  Nothing tried Associated symptoms: shortness of breath   Associated symptoms: no back pain, no cough, no fever and not vomiting   Risk factors: no prior DVT/PE   patient reports multiple episodes of left sided CP that radiates into left arm This has occurred multiple times this past week No current CP at this time No pleuritic CP reported  Family history - positive for CAD Past Medical History  Diagnosis Date  . ASCVD (arteriosclerotic cardiovascular disease)     No critical disease on cath in 8/97: mild AI with trival, if any l AS; negative stress nuclear in 2010  . Peripheral vascular disease ([place])     stauts post aortabifemoral graft    . Tobacco abuse     has stopped  . Chronic bronchitis   . Hypertension   . Fasting hyperglycemia   . Elevated hemoglobin A1c   . Hydronephrosis   . GERD (gastroesophageal reflux disease)   . Back pain   . Depression   . Osteoporosis   . Secondary erythrocytosis 08/05/2012    Secondary to COPD  . Cervical disc herniation   . Colon polyps    Past Surgical History  Procedure Laterality Date  . Cholecystectomy    . Aorta bifem graft    . Right kidney surgery following damage during arterial surgery    . Partial hysterectomy    . Total abdominal hysterectomy w/ bilateral salpingoophorectomy  1975  . Lysis of adhesions  2000  . Abdominal hysterectomy    . Colonoscopy  06/23/2006    Dr.  Fields:Normal colon without evidence of polyps, masses, inflammatory changes/Normal retroflex view of the rectum  . Colonoscopy N/A 08/24/2013    Procedure: COLONOSCOPY;  Surgeon: Danie Binder, MD;  [facility];  Service: Endoscopy;  Laterality: N/A;  10:30-moved to [place] notified pt   Family History  Problem Relation Age of Onset  . Heart attack Father   . COPD Sister   . Heart disease Sister   . Diabetes Sister   . Coronary artery disease Sister   . Cancer Sister 61    liver  . Diabetes Sister   . Diabetes Sister   . COPD Sister   . Colon cancer Neg Hx    Social History  Substance Use Topics  . Smoking status: Former Smoker -- 0.50 packs/day    Types: Cigarettes  . Smokeless tobacco: Former Systems developer    Quit date: 05/12/2012  . Alcohol Use: No   OB History    No data available     Review of Systems  Constitutional: Negative for fever.  Respiratory: Positive for shortness of breath. Negative for cough.   Cardiovascular: Positive for chest pain.  Gastrointestinal: Negative for vomiting.  Musculoskeletal: Negative for back pain and neck pain.  All other systems reviewed and are negative.  Allergies  Calcium-containing compounds  Home Medications   Prior to Admission medications   Medication Sig Start Date End Date Taking? Authorizing Provider  acyclovir (ZOVIRAX) 800 MG tablet Take 1 tablet (800 mg total) by mouth 2 (two) times daily. 10/27/14  Yes Fayrene Helper, MD  ALPRAZolam Duanne Moron) 0.5 MG tablet TAKE ONE TABLET BY MOUTH TWICE DAILY. 04/17/15  Yes Fayrene Helper, MD  amLODipine (NORVASC) 10 MG tablet Take 1 tablet (10 mg total) by mouth daily. 12/15/14  Yes Fayrene Helper, MD  benzonatate (TESSALON) 100 MG capsule TAKE ONE CAPSULE BY MOUTH TWICE DAILY AS NEEDED FOR COUGH 04/17/15  Yes Fayrene Helper, MD  Cholecalciferol (VITAMIN D) 2000 UNITS CAPS Take 1 capsule by mouth daily.   Yes Historical Provider, MD  cyclobenzaprine  (FLEXERIL) 10 MG tablet TAKE ONE TABLET BY MOUTH 3 TIMES DAILY AS NEEDED FOR MUSCLE SPASM(S). 05/26/14  Yes Fayrene Helper, MD  diazepam (VALIUM) 5 MG tablet TAKE (1) TABLET BY MOUTH TWICE DAILY AS NEEDED. 06/14/14  Yes Fayrene Helper, MD  HYDROcodone-acetaminophen [facility]) 10-325 MG tablet One three times daily 05/02/15 06/02/15 Yes Fayrene Helper, MD  Multiple Vitamin (MULTIVITAMIN WITH MINERALS) TABS Take 1 tablet by mouth every evening.    Yes Historical Provider, MD  Omega-3 Fatty Acids (FISH OIL PO) Take 1 capsule by mouth daily.   Yes Historical Provider, MD  potassium chloride (K-DUR) 10 MEQ tablet Take 10 mEq by mouth daily.   Yes Historical Provider, MD  predniSONE (DELTASONE) 5 MG tablet Take 1 tablet (5 mg total) by mouth 2 (two) times daily. 05/30/15 06/03/15 Yes Fayrene Helper, MD  rosuvastatin (CRESTOR) 20 MG tablet TAKE 1 TABLET BY MOUTH ONCE DAILY. 04/24/15  Yes Fayrene Helper, MD  alendronate (FOSAMAX) 70 MG tablet Take 1 tablet (70 mg total) by mouth every 7 (seven) days. Take with a full glass of water on an empty stomach. Patient taking differently: Take 70 mg by mouth every Saturday. Take with a full glass of water on an empty stomach. 04/25/14   Fayrene Helper, MD  gabapentin (NEURONTIN) 100 MG capsule Take 1 capsule (100 mg total) by mouth 3 (three) times daily. Patient not taking: Reported on 05/30/2015 05/01/15   Fayrene Helper, MD  pregabalin (LYRICA) 25 MG capsule Take 1 capsule (25 mg total) by mouth daily. Patient not taking: Reported on 05/30/2015 05/25/15   Fayrene Helper, MD  promethazine-dextromethorphan (PROMETHAZINE-DM) 6.25-15 MG/5ML syrup One teaspoon at bedtime , as needed, for excessive cough Patient not taking: Reported on 05/30/2015 05/30/15   Fayrene Helper, MD   BP 122/79 mmHg  Pulse 100  Temp(Src) 98 F (36.7 C) (Oral)  Resp 15  Ht 5\' 3"  (1.6 m)  Wt 65.772 kg  BMI 25.69 kg/m2  SpO2 96% Physical Exam CONSTITUTIONAL: Well developed/well  nourished HEAD: Normocephalic/atraumatic EYES: EOMI ENMT: Mucous membranes moist NECK: supple no meningeal signs SPINE/BACK:entire spine nontender CV: S1/S2 noted, no murmurs/rubs/gallops noted LUNGS: Lungs are clear to auscultation bilaterally, no apparent distress ABDOMEN: soft, nontender, no rebound or guarding, bowel sounds noted throughout abdomen NEURO: Pt is awake/alert/appropriate, moves all extremitiesx4.  No facial droop.   EXTREMITIES: pulses normal/equal, full ROM, no calf tenderness, no significant LE edema SKIN: warm, color normal PSYCH: no abnormalities of mood noted, alert and oriented to situation  ED Course  Procedures  3:45 PM Pt reports multiple episodes of CP EKG has some worsened ST depression in inferior leads HEART score  5 Will need admission No pleuritic pain.  I doubt PE/Dissection at this time 4:19 PM D/w dr [place] for admission to [place] Reviewed  CBC WITH DIFFERENTIAL/PLATELET - Abnormal; Notable for the following:    WBC 11.5 (*)    Hemoglobin 16.1 (*)    HCT 46.9 (*)    Lymphs Abs 4.4 (*)    All other components within normal limits  BASIC METABOLIC PANEL - Abnormal; Notable for the following:    Potassium 3.4 (*)    Glucose, Bld 133 (*)    Creatinine, Ser 1.09 (*)    GFR calc non Af Amer 50 (*)    GFR calc Af Amer 58 (*)    All other components within normal limits  TROPONIN I    Imaging Review Dg Chest Portable 1 View  05/30/2015  CLINICAL DATA:  Left-sided chest pain EXAM: PORTABLE CHEST 1 VIEW COMPARISON:  11/27/2013 FINDINGS: Normal heart size. Lungs clear. No pneumothorax. No pleural effusion. IMPRESSION: No active disease. Electronically Signed   By: Marybelle Killings M.D.   On: 05/30/2015 15:00   I have personally reviewed and evaluated these images and lab results as part of my medical decision-making.   EKG Interpretation   Date/Time:  Tuesday May 30 2015 14:41:39 EST Ventricular Rate:  105 PR Interval:   170 QRS Duration: 86 QT Interval:  339 QTC Calculation: 448 R Axis:   85 Text Interpretation:  Sinus tachycardia Anterior infarct, age  indeterminate ST depression in Inferior injury pattern suggests right  ventricular involvement, recommend adding leads V3r and V4r to confirm  Confirmed by Christy Gentles  MD, Nasif Bos ([id number]) on 05/30/2015 3:06:09 PM      MDM   Final diagnoses:  Chest pain, rule out acute myocardial infarction    Nursing notes including past medical history and social history reviewed and considered in documentation Labs/vital reviewed myself and considered during evaluation xrays/imaging reviewed by myself and considered during evaluation     Ripley Fraise, MD 05/30/15 1619

## 2015-05-30 NOTE — H&P (Signed)
[facility] History and Physical  Carla [name] [id number] DOB: Mar 17, 1946 DOA: 05/30/2015  Referring physician: Christy Gentles PCP: Tula Nakayama, MD   Chief Complaint: Chest pain  HPI:  [name] is a 70 year old female with a past medical history significant for HTN, peripheral vascular disease, tobacco abuse (quit 3 years ago); who presents with complaints of intermittent chest pain over the last 2-3 days. Reports it as a sharp pain on the left lateral aspect of her chest that radiates into her left arm. Feels as though it shoots across her chest. Symptoms lasted approximately 2 minutes or so, and then self resolve.  Nothing aggravates or relieves symptoms. Denies trying anything to make symptoms better. Denies any palpitations, shortness of breath, diaphoresis, abdominal pain, nausea, or vomiting  with these episodes. This afternoon the pain was significantly worse and rates it a 6 out of 10 on the pain scale. Symptoms now resolved and denies any chest pain symptoms. She also complains of having a "cold" for the last one and half weeks. The cough is only mildly productive. Reports using DayQuil with some mild relief of symptoms. Denies any fever or chills.  Upon admission patient is evaluated and her EKG has what appears to be ST depressions worsened and previous EKGs. Initial troponin was negative. [facility] consulted to admit for chest pain rule out.   Review of Systems  Constitutional: Positive for malaise/fatigue. Negative for fever, chills and weight loss.  HENT: Negative for ear discharge and nosebleeds.   Eyes: Negative for photophobia and pain.  Respiratory: Positive for cough and sputum production.   Cardiovascular: Positive for chest pain. Negative for palpitations.  Gastrointestinal: Negative for nausea, vomiting and abdominal pain.  Genitourinary: Negative for urgency and frequency.  Musculoskeletal: Negative for back pain and neck pain.  Skin: Negative for  itching and rash.  Neurological: Negative for focal weakness and seizures.  Endo/Heme/Allergies: Negative for environmental allergies. Does not bruise/bleed easily.  Psychiatric/Behavioral: Negative for suicidal ideas and substance abuse.     Past Medical History  Diagnosis Date  . ASCVD (arteriosclerotic cardiovascular disease)     No critical disease on cath in 8/97: mild AI with trival, if any l AS; negative stress nuclear in 2010  . Peripheral vascular disease ([place])     stauts post aortabifemoral graft    . Tobacco abuse     has stopped  . Chronic bronchitis   . Hypertension   . Fasting hyperglycemia   . Elevated hemoglobin A1c   . Hydronephrosis   . GERD (gastroesophageal reflux disease)   . Back pain   . Depression   . Osteoporosis   . Secondary erythrocytosis 08/05/2012    Secondary to COPD  . Cervical disc herniation   . Colon polyps      Past Surgical History  Procedure Laterality Date  . Cholecystectomy    . Aorta bifem graft    . Right kidney surgery following damage during arterial surgery    . Partial hysterectomy    . Total abdominal hysterectomy w/ bilateral salpingoophorectomy  1975  . Lysis of adhesions  2000  . Abdominal hysterectomy    . Colonoscopy  06/23/2006    Dr. Fields:Normal colon without evidence of polyps, masses, inflammatory changes/Normal retroflex view of the rectum  . Colonoscopy N/A 08/24/2013    Procedure: COLONOSCOPY;  Surgeon: Danie Binder, MD;  Location: [facility];  Service: Endoscopy;  Laterality: N/A;  10:30-moved to [place] notified pt  Social History:  reports that she has quit smoking. Her smoking use included Cigarettes. She smoked 0.50 packs per day. She quit smokeless tobacco use about 3 years ago. She reports that she does not drink alcohol or use illicit drugs. Where does patient live--home  and with whom if at home?Husband Can patient participate in ADLs? Yes  Allergies  Allergen Reactions  .  Calcium-Containing Compounds Other (See Comments)    Pt has hyperparathyroidism which results in hypercalcemia    Family History  Problem Relation Age of Onset  . Heart attack Father   . COPD Sister   . Heart disease Sister   . Diabetes Sister   . Coronary artery disease Sister   . Cancer Sister 41    liver  . Diabetes Sister   . Diabetes Sister   . COPD Sister   . Colon cancer Neg Hx         Prior to Admission medications   Medication Sig Start Date End Date Taking? Authorizing Provider  acyclovir (ZOVIRAX) 800 MG tablet Take 1 tablet (800 mg total) by mouth 2 (two) times daily. 10/27/14  Yes Fayrene Helper, MD  ALPRAZolam Duanne Moron) 0.5 MG tablet TAKE ONE TABLET BY MOUTH TWICE DAILY. 04/17/15  Yes Fayrene Helper, MD  amLODipine (NORVASC) 10 MG tablet Take 1 tablet (10 mg total) by mouth daily. 12/15/14  Yes Fayrene Helper, MD  benzonatate (TESSALON) 100 MG capsule TAKE ONE CAPSULE BY MOUTH TWICE DAILY AS NEEDED FOR COUGH 04/17/15  Yes Fayrene Helper, MD  Cholecalciferol (VITAMIN D) 2000 UNITS CAPS Take 1 capsule by mouth daily.   Yes Historical Provider, MD  cyclobenzaprine (FLEXERIL) 10 MG tablet TAKE ONE TABLET BY MOUTH 3 TIMES DAILY AS NEEDED FOR MUSCLE SPASM(S). 05/26/14  Yes Fayrene Helper, MD  diazepam (VALIUM) 5 MG tablet TAKE (1) TABLET BY MOUTH TWICE DAILY AS NEEDED. 06/14/14  Yes Fayrene Helper, MD  HYDROcodone-acetaminophen [facility]) 10-325 MG tablet One three times daily 05/02/15 06/02/15 Yes Fayrene Helper, MD  Multiple Vitamin (MULTIVITAMIN WITH MINERALS) TABS Take 1 tablet by mouth every evening.    Yes Historical Provider, MD  Omega-3 Fatty Acids (FISH OIL PO) Take 1 capsule by mouth daily.   Yes Historical Provider, MD  potassium chloride (K-DUR) 10 MEQ tablet Take 10 mEq by mouth daily.   Yes Historical Provider, MD  rosuvastatin (CRESTOR) 20 MG tablet TAKE 1 TABLET BY MOUTH ONCE DAILY. 04/24/15  Yes Fayrene Helper, MD  alendronate (FOSAMAX) 70  MG tablet Take 1 tablet (70 mg total) by mouth every 7 (seven) days. Take with a full glass of water on an empty stomach. Patient taking differently: Take 70 mg by mouth every Saturday. Take with a full glass of water on an empty stomach. 04/25/14   Fayrene Helper, MD  gabapentin (NEURONTIN) 100 MG capsule Take 1 capsule (100 mg total) by mouth 3 (three) times daily. Patient not taking: Reported on 05/30/2015 05/01/15   Fayrene Helper, MD  predniSONE (DELTASONE) 5 MG tablet Take 1 tablet (5 mg total) by mouth 2 (two) times daily. 05/30/15 06/03/15  Fayrene Helper, MD  pregabalin (LYRICA) 25 MG capsule Take 1 capsule (25 mg total) by mouth daily. Patient not taking: Reported on 05/30/2015 05/25/15   Fayrene Helper, MD  promethazine-dextromethorphan (PROMETHAZINE-DM) 6.25-15 MG/5ML syrup One teaspoon at bedtime , as needed, for excessive cough 05/30/15   Fayrene Helper, MD     Physical Exam: Danley Danker  Vitals:   05/30/15 1444 05/30/15 1445 05/30/15 1448 05/30/15 1454  BP: 122/79     Pulse: 98 98 101 100  Temp: 98 F (36.7 C)     TempSrc: Oral     Resp: 18 16 13 15   Height: 5\' 3"  (1.6 m)     Weight: 65.772 kg (145 lb)     SpO2: 100% 97% 100% 96%    Constitutional: Vital signs reviewed. Patient is a well-developed and well-nourished in no acute distress and cooperative with exam. Alert and oriented x3.  Head: Normocephalic and atraumatic  Ear: TM normal bilaterally  Mouth: no erythema or exudates, MMM  Eyes: PERRL, EOMI, conjunctivae normal, No scleral icterus.  Neck: Supple, Trachea midline normal ROM, No JVD, mass, thyromegaly, or carotid bruit present.  Cardiovascular: RRR, S1 normal, S2 normal, no MRG, pulses symmetric and intact bilaterally  Pulmonary/Chest: CTAB, no wheezes. Some mild rales heard on the left lung base. Patient intermittently coughs during exam. Abdomen: Non-tender, non-distended, bowel sounds are normal, no masses, organomegaly, or guarding present.  GU: no CVA  tenderness Musculoskeletal: No joint deformities, erythema, or stiffness, ROM full and no nontender Ext: no edema and no cyanosis, pulses palpable bilaterally (DP and PT)  Hematology: no cervical, inginal, or axillary adenopathy.  Neurological: A&O x3, Strenght is normal and symmetric bilaterally, cranial nerve II-XII are grossly intact, no focal motor deficit, sensory intact to light touch bilaterally.  Skin: Warm, dry and intact. No rash, cyanosis, or clubbing.  Psychiatric: Normal mood and affect. speech and behavior is normal. Judgment and thought content normal. Cognition and memory are normal.      Data Review   Micro Results No results found for this or any previous visit (from the past 240 hour(s)).  Radiology Reports Dg Chest Portable 1 View  05/30/2015  CLINICAL DATA:  Left-sided chest pain EXAM: PORTABLE CHEST 1 VIEW COMPARISON:  11/27/2013 FINDINGS: Normal heart size. Lungs clear. No pneumothorax. No pleural effusion. IMPRESSION: No active disease. Electronically Signed   By: Marybelle Killings M.D.   On: 05/30/2015 15:00     CBC  Recent Labs Lab 05/30/15 1445  WBC 11.5*  HGB 16.1*  HCT 46.9*  PLT 326  MCV 93.4  MCH 32.1  MCHC 34.3  RDW 13.9  LYMPHSABS 4.4*  MONOABS 0.9  EOSABS 0.3  BASOSABS 0.1    Chemistries   Recent Labs Lab 05/30/15 1445  NA 138  K 3.4*  CL 102  CO2 24  GLUCOSE 133*  BUN 14  CREATININE 1.09*  CALCIUM 10.2   ------------------------------------------------------------------------------------------------------------------ estimated creatinine clearance is 43.8 mL/min (by C-G formula based on Cr of 1.09). ------------------------------------------------------------------------------------------------------------------ No results for input(s): HGBA1C in the last 72 hours. ------------------------------------------------------------------------------------------------------------------ No results for input(s): CHOL, HDL, LDLCALC, TRIG,  CHOLHDL, LDLDIRECT in the last 72 hours. ------------------------------------------------------------------------------------------------------------------ No results for input(s): TSH, T4TOTAL, T3FREE, THYROIDAB in the last 72 hours.  Invalid input(s): FREET3 ------------------------------------------------------------------------------------------------------------------ No results for input(s): VITAMINB12, FOLATE, FERRITIN, TIBC, IRON, RETICCTPCT in the last 72 hours.  Coagulation profile No results for input(s): INR, PROTIME in the last 168 hours.  No results for input(s): DDIMER in the last 72 hours.  Cardiac Enzymes  Recent Labs Lab 05/30/15 1445  TROPONINI <0.03   ------------------------------------------------------------------------------------------------------------------ Invalid input(s): POCBNP   CBG: No results for input(s): GLUCAP in the last 168 hours.     EKG: Independently reviewed. Sinus tachycardia with ST depressions noted in 2, 3 and aVF  that appear slightly deepened compared to previous EKG done  and 07/15/2013   Assessment/Plan  Chest pain, rule out acute myocardial infarction: Patient reporting intermittent bouts of left-sided sharp shooting like chest pain radiating to her left arm. Patient with multiple risk factors including age, sex, hypertension, PVD, previous tobacco abuse history. Heart score is at least 5. EKG showing questionable ST depression worsened than previous EKG. Initial troponins negative. - Admit to a telemetry bed - Cardiac troponins 3  - Repeat EKG at 6 a.m.  - Echocardiogram - Heart healthy diet for now, then NPO after 4 a.m. - Consult cardiology in a.m.  Abnormal EKG: As seen above  Essential hypertension - Continue amlodipine  Leukocytosis: Acute.WBC elevated at 11.5 on admission. This could be secondary to what is presumed to be a viral illness. - Check urinalysis  Suspected Hemoconcentration versus secondary  polycythemia:  hemoglobin elevated at 16.1 on admission, possible causes include but are not limited to history of smoking as versus acute viral illness and possible dehydration. - Gentle IV normal saline at 100 mL per hour for 1 L  - Recheck CBC in a.m.  Possible upper respiratory infection/viral syndrome: Patient with reports of cough and sputum production over the last one half weeks.  -  - Mucinex for congestion - Tessalon Perles prn cough   Renal insufficiency: Creatinine found to be slightly elevated compared to patient's previous baseline creatinine which appears to be less than 1 - Continue to monitor  Hypokalemia: Potassium just mildly low at 3.4 on admission:  - replaced with 20 mEq of potassium - Continue to monitor and replace as needed  History of diabetes mellitus type 2: Last hemoglobin A1c 6.5 in 01/2015 - Continue to monitor  Hyperlipidemia - Check lipid panel in a.m. - Continue Crestor   Anxiety history  - Changed home Xanax/Valium to prn as it appeared like it was scheduled per medication reconciliation   Lovenox for DVT prophylaxis  Code Status:   full Family Communication: bedside Disposition Plan: admit   Total time spent 55 minutes.Greater than 50% of this time was spent in counseling, explanation of diagnosis, planning of further management, and coordination of care  [facility] Pager [phone]  If 7PM-7AM, please contact night-coverage [email or web address] Password [facility] 05/30/2015, 4:43 PM

## 2015-05-30 NOTE — Telephone Encounter (Signed)
Phenergan dm and prednisone sent to [place]

## 2015-05-31 ENCOUNTER — Encounter (HOSPITAL_COMMUNITY): Payer: Self-pay

## 2015-05-31 ENCOUNTER — Observation Stay (HOSPITAL_COMMUNITY): Payer: PPO

## 2015-05-31 ENCOUNTER — Observation Stay (HOSPITAL_BASED_OUTPATIENT_CLINIC_OR_DEPARTMENT_OTHER): Payer: PPO

## 2015-05-31 DIAGNOSIS — R079 Chest pain, unspecified: Secondary | ICD-10-CM

## 2015-05-31 DIAGNOSIS — I10 Essential (primary) hypertension: Secondary | ICD-10-CM | POA: Diagnosis not present

## 2015-05-31 LAB — BASIC METABOLIC PANEL
ANION GAP: 8 (ref 5–15)
BUN: 17 mg/dL (ref 6–20)
CHLORIDE: 106 mmol/L (ref 101–111)
CO2: 26 mmol/L (ref 22–32)
CREATININE: 0.91 mg/dL (ref 0.44–1.00)
Calcium: 9.8 mg/dL (ref 8.9–10.3)
GFR calc non Af Amer: >60 mL/min (ref >60)
Glucose, Bld: 189 mg/dL — ABNORMAL HIGH (ref 65–99)
POTASSIUM: 4.1 mmol/L (ref 3.5–5.1)
SODIUM: 140 mmol/L (ref 135–145)

## 2015-05-31 LAB — NM MYOCAR MULTI W/SPECT W/WALL MOTION / EF
CHL CUP MPHR: 150 {beats}/min
CHL CUP NUCLEAR SSS: 5
CHL CUP RESTING HR STRESS: 80 {beats}/min
CHL RATE OF PERCEIVED EXERTION: 17
CSEPEDS: 42 s
CSEPHR: 88 %
Estimated workload: 4.6 METS
Exercise duration (min): 4 min
LV dias vol: 49 mL (ref 46–106)
LV sys vol: 9 mL
NUC STRESS TID: 0.94
Peak HR: 133 {beats}/min
RATE: 0.3
SDS: 3
SRS: 4

## 2015-05-31 LAB — LIPID PANEL
CHOLESTEROL: 136 mg/dL (ref 0–200)
HDL: 41 mg/dL (ref >40)
LDL Cholesterol: 42 mg/dL (ref 0–99)
TRIGLYCERIDES: 263 mg/dL — AB (ref <150)
Total CHOL/HDL Ratio: 3.3 RATIO
VLDL: 53 mg/dL — AB (ref 0–40)

## 2015-05-31 LAB — CBC WITH DIFFERENTIAL/PLATELET
BASOS ABS: 0 10*3/uL (ref 0.0–0.1)
BASOS PCT: 1 %
EOS ABS: 0.1 10*3/uL (ref 0.0–0.7)
Eosinophils Relative: 1 %
HEMATOCRIT: 42.4 % (ref 36.0–46.0)
HEMOGLOBIN: 14.4 g/dL (ref 12.0–15.0)
Lymphocytes Relative: 24 %
Lymphs Abs: 1.5 10*3/uL (ref 0.7–4.0)
MCH: 31.9 pg (ref 26.0–34.0)
MCHC: 34 g/dL (ref 30.0–36.0)
MCV: 94 fL (ref 78.0–100.0)
Monocytes Absolute: 0.2 10*3/uL (ref 0.1–1.0)
Monocytes Relative: 3 %
NEUTROS ABS: 4.7 10*3/uL (ref 1.7–7.7)
NEUTROS PCT: 71 %
Platelets: 292 10*3/uL (ref 150–400)
RBC: 4.51 MIL/uL (ref 3.87–5.11)
RDW: 13.8 % (ref 11.5–15.5)
WBC: 6.5 10*3/uL (ref 4.0–10.5)

## 2015-05-31 LAB — ECHOCARDIOGRAM COMPLETE
HEIGHTINCHES: 63 in
WEIGHTICAEL: 2320 [oz_av]

## 2015-05-31 LAB — TROPONIN I: Troponin I: <0.03 ng/mL (ref <0.031)

## 2015-05-31 MED ORDER — TECHNETIUM TC 99M SESTAMIBI - CARDIOLITE
10.0000 | Freq: Once | INTRAVENOUS | Status: AC | PRN
  Administered 2015-05-31: 9 via INTRAVENOUS

## 2015-05-31 MED ORDER — REGADENOSON 0.4 MG/5ML IV SOLN
INTRAVENOUS | Status: AC
  Filled 2015-05-31: qty 5

## 2015-05-31 MED ORDER — SODIUM CHLORIDE 0.9% FLUSH
INTRAVENOUS | Status: AC
  Administered 2015-05-31: 10 mL via INTRAVENOUS
  Filled 2015-05-31: qty 10

## 2015-05-31 MED ORDER — TECHNETIUM TC 99M SESTAMIBI GENERIC - CARDIOLITE
30.0000 | Freq: Once | INTRAVENOUS | Status: AC | PRN
  Administered 2015-05-31: 28 via INTRAVENOUS

## 2015-05-31 NOTE — Progress Notes (Signed)
Back from stress test, telemetry in place. No diet ordered.

## 2015-05-31 NOTE — Care Management Obs Status (Signed)
MEDICARE OBSERVATION STATUS NOTIFICATION   Patient Details  Name: Carla [name] MRN: [id number] Date of Birth: 1946-02-02   Medicare Observation Status Notification Given:  Yes    Alvie Heidelberg, RN 05/31/2015, 11:26 AM

## 2015-05-31 NOTE — Progress Notes (Signed)
PAtient anxious and complains of chest pain 3 out of 10 but verbalizes not wanting to take pain medicine.  RN offered the GI cocktail that she had ordered for the chest pain and she agreed.

## 2015-05-31 NOTE — Consult Note (Signed)
Reason for Consult: Chest pain Referring Physician: PTH Primary care physician Dr. Moshe Cipro Cardiologist prior patient of Dr. Lattie [name] now Dr. Daphine [name] is an 70 y.o. [name].  HPI: This is a 70 year old [name] patient admitted with chest pain. She has been seen in the past by Dr. Lattie [name] last in 2012. At that time she had a normal stress echo with very poor exercise tolerance, nonobstructive CAD on cath in 1997.Marland Kitchen She also has COPD(quit smoking 3 yrs ago), hypertension, hyperlipidemia, PVD status post aortobifem graft, anxiety depression.  Patient complains of 1-2 month history of tingling chest pain radiating to left elbow. Occurs at rest and lasts only a few seconds. Happens once every 1-2 weeks. Denies chest tightness or pressure.No associated dyspnea, diaphoresis, nausea, dizziness, palpitations or presyncope. Not related to food, no GI symptoms. Works in [employer], [employer] without any exertional symptoms. Yesterdays episode was stronger than others so came to ER but pain gone when she got here. Had another episode last night that woke her from sleep but eased before NTG was given.  Troponins negative 3, EKG sinus tachycardia at 105 bpm with question of ST depression inferiorly poor baseline EKG  Past Medical History  Diagnosis Date  . ASCVD (arteriosclerotic cardiovascular disease)     No critical disease on cath in 8/97: mild AI with trival, if any l AS; negative stress nuclear in 2010  . Peripheral vascular disease ([place])     stauts post aortabifemoral graft    . Tobacco abuse     has stopped  . Chronic bronchitis   . Hypertension   . Fasting hyperglycemia   . Elevated hemoglobin A1c   . Hydronephrosis   . GERD (gastroesophageal reflux disease)   . Back pain   . Depression   . Osteoporosis   . Secondary erythrocytosis 08/05/2012    Secondary to COPD  . Cervical disc herniation   . Colon polyps     Past Surgical History  Procedure Laterality Date  . Cholecystectomy     . Aorta bifem graft    . Right kidney surgery following damage during arterial surgery    . Partial hysterectomy    . Total abdominal hysterectomy w/ bilateral salpingoophorectomy  1975  . Lysis of adhesions  2000  . Abdominal hysterectomy    . Colonoscopy  06/23/2006    Dr. Fields:Normal colon without evidence of polyps, masses, inflammatory changes/Normal retroflex view of the rectum  . Colonoscopy N/A 08/24/2013    Procedure: COLONOSCOPY;  Surgeon: Danie Binder, MD;  Location: [facility];  Service: Endoscopy;  Laterality: N/A;  10:30-moved to [place] notified pt    Family History  Problem Relation Age of Onset  . Heart attack Father   . COPD Sister   . Heart disease Sister   . Diabetes Sister   . Coronary artery disease Sister   . Cancer Sister 62    liver  . Diabetes Sister   . Diabetes Sister   . COPD Sister   . Colon cancer Neg Hx     Social History:  reports that she has quit smoking. Her smoking use included Cigarettes. She smoked 0.50 packs per day. She quit smokeless tobacco use about 3 years ago. She reports that she does not drink alcohol or use illicit drugs.  Allergies:  Allergies  Allergen Reactions  . Calcium-Containing Compounds Other (See Comments)    Pt has hyperparathyroidism which results in hypercalcemia    Medications:  Scheduled Meds: .  acyclovir  800 mg Oral BID  . ALPRAZolam  0.5 mg Oral BID  . amLODipine  10 mg Oral Daily  . cholecalciferol  2,000 Units Oral Daily  . enoxaparin (LOVENOX) injection  40 mg Subcutaneous Q24H  . guaiFENesin  600 mg Oral BID  . multivitamin with minerals  1 tablet Oral QPM  . potassium chloride  10 mEq Oral Daily  . predniSONE  5 mg Oral BID  . rosuvastatin  20 mg Oral q1800   Continuous Infusions:  PRN Meds:.acetaminophen, benzonatate, cyclobenzaprine, diazepam, gi cocktail, HYDROcodone-acetaminophen, morphine injection, ondansetron (ZOFRAN) IV   Results for orders placed or performed during the  hospital encounter of 05/30/15 (from the past 48 hour(s))  CBC with Differential     Status: Abnormal   Collection Time: 05/30/15  2:45 PM  Result Value Ref Range   WBC 11.5 (H) 4.0 - 10.5 K/uL   RBC 5.02 3.87 - 5.11 MIL/uL   Hemoglobin 16.1 (H) 12.0 - 15.0 g/dL   HCT 46.9 (H) 36.0 - 46.0 %   MCV 93.4 78.0 - 100.0 fL   MCH 32.1 26.0 - 34.0 pg   MCHC 34.3 30.0 - 36.0 g/dL   RDW 13.9 11.5 - 15.5 %   Platelets 326 150 - 400 K/uL   Neutrophils Relative % 51 %   Neutro Abs 5.8 1.7 - 7.7 K/uL   Lymphocytes Relative 38 %   Lymphs Abs 4.4 (H) 0.7 - 4.0 K/uL   Monocytes Relative 7 %   Monocytes Absolute 0.9 0.1 - 1.0 K/uL   Eosinophils Relative 2 %   Eosinophils Absolute 0.3 0.0 - 0.7 K/uL   Basophils Relative 1 %   Basophils Absolute 0.1 0.0 - 0.1 K/uL   WBC Morphology ATYPICAL LYMPHOCYTES   Troponin I     Status: None   Collection Time: 05/30/15  2:45 PM  Result Value Ref Range   Troponin I <0.03 <0.031 ng/mL    Comment:        NO INDICATION OF MYOCARDIAL INJURY.   Basic metabolic panel     Status: Abnormal   Collection Time: 05/30/15  2:45 PM  Result Value Ref Range   Sodium 138 135 - 145 mmol/L   Potassium 3.4 (L) 3.5 - 5.1 mmol/L   Chloride 102 101 - 111 mmol/L   CO2 24 22 - 32 mmol/L   Glucose, Bld 133 (H) 65 - 99 mg/dL   BUN 14 6 - 20 mg/dL   Creatinine, Ser 1.09 (H) 0.44 - 1.00 mg/dL   Calcium 10.2 8.9 - 10.3 mg/dL   GFR calc non Af Amer 50 (L) >60 mL/min   GFR calc Af Amer 58 (L) >60 mL/min    Comment: (NOTE) The eGFR has been calculated using the CKD EPI equation. This calculation has not been validated in all clinical situations. eGFR's persistently <60 mL/min signify possible Chronic Kidney Disease.    Anion gap 12 5 - 15  Troponin I-serum (0, 3, 6 hours)     Status: None   Collection Time: 05/30/15  5:45 PM  Result Value Ref Range   Troponin I <0.03 <0.031 ng/mL    Comment:        NO INDICATION OF MYOCARDIAL INJURY.   Troponin I-serum (0, 3, 6 hours)      Status: None   Collection Time: 05/30/15  8:45 PM  Result Value Ref Range   Troponin I <0.03 <0.031 ng/mL    Comment:  NO INDICATION OF MYOCARDIAL INJURY.   Troponin I-serum (0, 3, 6 hours)     Status: None   Collection Time: 05/31/15  1:39 AM  Result Value Ref Range   Troponin I <0.03 <0.031 ng/mL    Comment:        NO INDICATION OF MYOCARDIAL INJURY.   CBC with Differential/Platelet     Status: None   Collection Time: 05/31/15  1:39 AM  Result Value Ref Range   WBC 6.5 4.0 - 10.5 K/uL   RBC 4.51 3.87 - 5.11 MIL/uL   Hemoglobin 14.4 12.0 - 15.0 g/dL   HCT 42.4 36.0 - 46.0 %   MCV 94.0 78.0 - 100.0 fL   MCH 31.9 26.0 - 34.0 pg   MCHC 34.0 30.0 - 36.0 g/dL   RDW 13.8 11.5 - 15.5 %   Platelets 292 150 - 400 K/uL   Neutrophils Relative % 71 %   Neutro Abs 4.7 1.7 - 7.7 K/uL   Lymphocytes Relative 24 %   Lymphs Abs 1.5 0.7 - 4.0 K/uL   Monocytes Relative 3 %   Monocytes Absolute 0.2 0.1 - 1.0 K/uL   Eosinophils Relative 1 %   Eosinophils Absolute 0.1 0.0 - 0.7 K/uL   Basophils Relative 1 %   Basophils Absolute 0.0 0.0 - 0.1 K/uL  Basic metabolic panel     Status: Abnormal   Collection Time: 05/31/15  1:39 AM  Result Value Ref Range   Sodium 140 135 - 145 mmol/L   Potassium 4.1 3.5 - 5.1 mmol/L    Comment: DELTA CHECK NOTED   Chloride 106 101 - 111 mmol/L   CO2 26 22 - 32 mmol/L   Glucose, Bld 189 (H) 65 - 99 mg/dL   BUN 17 6 - 20 mg/dL   Creatinine, Ser 0.91 0.44 - 1.00 mg/dL   Calcium 9.8 8.9 - 10.3 mg/dL   GFR calc non Af Amer >60 >60 mL/min   GFR calc Af Amer >60 >60 mL/min    Comment: (NOTE) The eGFR has been calculated using the CKD EPI equation. This calculation has not been validated in all clinical situations. eGFR's persistently <60 mL/min signify possible Chronic Kidney Disease.    Anion gap 8 5 - 15  Lipid panel     Status: Abnormal   Collection Time: 05/31/15  1:39 AM  Result Value Ref Range   Cholesterol 136 0 - 200 mg/dL   Triglycerides  263 (H) <150 mg/dL   HDL 41 >40 mg/dL   Total CHOL/HDL Ratio 3.3 RATIO   VLDL 53 (H) 0 - 40 mg/dL   LDL Cholesterol 42 0 - 99 mg/dL    Comment:        Total Cholesterol/HDL:CHD Risk Coronary Heart Disease Risk Table                     Men   Women  1/2 Average Risk   3.4   3.3  Average Risk       5.0   4.4  2 X Average Risk   9.6   7.1  3 X Average Risk  23.4   11.0        Use the calculated Patient Ratio above and the CHD Risk Table to determine the patient's CHD Risk.        ATP III CLASSIFICATION (LDL):  <100     mg/dL   Optimal  100-129  mg/dL   Near or Above  Optimal  130-159  mg/dL   Borderline  160-189  mg/dL   High  >190     mg/dL   Very High     Dg Chest Portable 1 View  05/30/2015  CLINICAL DATA:  Left-sided chest pain EXAM: PORTABLE CHEST 1 VIEW COMPARISON:  11/27/2013 FINDINGS: Normal heart size. Lungs clear. No pneumothorax. No pleural effusion. IMPRESSION: No active disease. Electronically Signed   By: Marybelle Killings M.D.   On: 05/30/2015 15:00    ROS  See HPI Eyes: Negative Ears:Negative for hearing loss, tinnitus Cardiovascular: Negative for palpitations,irregular heartbeat, dyspnea,  near-syncope, orthopnea, paroxysmal nocturnal dyspnea and syncope,edema, claudication, cyanosis,.  Respiratory:   Negative for cough, hemoptysis, shortness of breath, sleep disturbances due to breathing, sputum production and wheezing.   Endocrine: Negative for cold intolerance and heat intolerance.  Hematologic/Lymphatic: Negative for adenopathy and bleeding problem. Does not bruise/bleed easily.  Musculoskeletal: Negative.   Gastrointestinal: Negative for nausea, vomiting, reflux, abdominal pain, diarrhea, constipation.   Genitourinary: Negative for bladder incontinence, dysuria, flank pain, frequency, hematuria, hesitancy, nocturia and urgency.  Neurological: Negative.  Allergic/Immunologic: Negative for environmental allergies.  Blood pressure 112/94,  pulse 75, temperature 97.6 F (36.4 C), temperature source Oral, resp. rate 20, height _0  (1.6 m), weight 145 lb (65.772 kg), SpO2 92 %. Physical Exam PHYSICAL EXAM: Well-nournished, in no acute distress. Neck: No JVD, HJR, Bruit, or thyroid enlargement Lungs: Decreased breath sounds but No tachypnea, clear without wheezing, rales, or rhonchi Cardiovascular: RRR, PMI not displaced,S4 1/6 systolic murmur LSB, no bruit, thrill, or heave. Abdomen: BS normal. Soft without organomegaly, masses, lesions or tenderness. Extremities: without cyanosis, clubbing or edema. Good distal pulses bilateral SKin: Warm, no lesions or rashes  Musculoskeletal: No deformities Neuro: no focal signs  IMPRESSION: Mild spinal stenosis at C3-4 and C4-5 secondary to spondylosis   At C5-6, there is marked improvement in the large central and right-sided disc protrusion seen on 11/09/2012. There is moderate spondylosis and moderate spinal stenosis at this level. No cord signal abnormality.     Electronically Signed   By: Franchot Gallo M.D.   On: 07/14/2013 17:55   Musculoskeletal: Mild spondylosis identified within the thoracic spine. No aggressive lytic or sclerotic bone lesions identified.   IMPRESSION: 1. Lung-RADS Category 2, benign appearance or behavior. Continue annual screening with low-dose chest CT without contrast in 12 months 2. Aortic atherosclerosis and multi vessel coronary artery calcification 3. Emphysema.     Electronically Signed   By: Kerby Moors M.D.   On: 02/09/2015 09:07   Assessment/Plan: Chest pain troponins negative 3 normal cath in 1997, normal stress echo 2012. Repeat EKG this am-NSR with nonspecific ST changes. Chest pain atypical, but multiple cardiac risk factors. Probably evaluate with exercise myoview. May need lexiscan as she's had trouble walking on GXT in past.multivessel coronary artery calcification on CT 01/2015. Current symptoms could be coming from  cervical disc disease and spondylosis.  Hypertension controlled on norvasc  Hyperlipidemia on crestor triglycerides 263.  Elevated Hbg A1C-diet controlled.  COPD former smoker  PVD status post aortobifem graft, carotid Dopplers less than 50% bilateral ICA in 2014  Cervical disc disease on imaging 2015-see above  Ermalinda Barrios 05/31/2015, 8:06 AM     Patient seen and discussed with PA Bonnell Public, I agree with her documentation above. 70 yo [name] history of COPD, HTN, hyperlipidemia, tobacco abuse, PAD, and CAD by 01/2015 chest CT but no ischemia by last stress test in 2012 admitted with chest pain.  K 3.4, Cr 1.09, Hgb 16.1, WBC 11.5, Plt 326, trop neg x 4, LDL 42, TG 263,  CXR no acute process EKG NSR   Somewhat atypical symptoms, EKG and enzymes negative for ACS. She has multiple CAD risk factors including noted multivessel CAD by CT scan 01/2015. We will plan for an exercise nuclear stress test to further evaluate.   Zandra Abts MD

## 2015-05-31 NOTE — Discharge Summary (Signed)
Physician Discharge Summary  Carla [name] [id number] DOB: 1945/12/08 DOA: 05/30/2015  PCP: Tula Nakayama, MD  Admit date: 05/30/2015 Discharge date: 05/31/2015  Time spent: 45 minutes  Recommendations for Outpatient Follow-up:  -We'll be discharged home today. -Has follow-up appointment with cardiology on 3/31.   Discharge Diagnoses:  Principal Problem:   Chest pain, rule out acute myocardial infarction Active Problems:   Hyperlipidemia LDL goal <100   Essential hypertension   Well controlled type 2 diabetes mellitus with peripheral circulatory disorder (HCC)   GAD (generalized anxiety disorder)   Discharge Condition: Stable and improved  Filed Weights   05/30/15 1444 05/30/15 1713  Weight: 65.772 kg (145 lb) 65.772 kg (145 lb)    History of present illness:  As per Dr. Tamala Julian on 3/7: Carla [name] is a 70 year old female with a past medical history significant for HTN, peripheral vascular disease, tobacco abuse (quit 3 years ago); who presents with complaints of intermittent chest pain over the last 2-3 days. Reports it as a sharp pain on the left lateral aspect of her chest that radiates into her left arm. Feels as though it shoots across her chest. Symptoms lasted approximately 2 minutes or so, and then self resolve. Nothing aggravates or relieves symptoms. Denies trying anything to make symptoms better. Denies any palpitations, shortness of breath, diaphoresis, abdominal pain, nausea, or vomiting with these episodes. This afternoon the pain was significantly worse and rates it a 6 out of 10 on the pain scale. Symptoms now resolved and denies any chest pain symptoms. She also complains of having a "cold" for the last one and half weeks. The cough is only mildly productive. Reports using DayQuil with some mild relief of symptoms. Denies any fever or chills.  Upon admission patient is evaluated and her EKG has what appears to be ST depressions worsened and previous EKGs.  Initial troponin was negative. [facility] consulted to admit for chest pain rule out.  Hospital Course:   Chest pain -Ruled out for acute coronary syndrome. -Given her significant coronary artery disease risk factors including multivessel coronary artery disease by CT scan, cardiology decided to perform an exercise nuclear stress test which has resulted negative for inducible ischemia.  -No further cardiac workup plan for at this time.  Rest of chronic medical conditions have been stable and home medications have not changed  Procedures:  None   Consultations:  None  Discharge Instructions  Discharge Instructions    Diet - low sodium heart healthy    Complete by:  As directed      Increase activity slowly    Complete by:  As directed             Medication List    STOP taking these medications        gabapentin 100 MG capsule  Commonly known as:  NEURONTIN     pregabalin 25 MG capsule  Commonly known as:  LYRICA      TAKE these medications        acyclovir 800 MG tablet  Commonly known as:  ZOVIRAX  Take 1 tablet (800 mg total) by mouth 2 (two) times daily.     alendronate 70 MG tablet  Commonly known as:  FOSAMAX  Take 1 tablet (70 mg total) by mouth every 7 (seven) days. Take with a full glass of water on an empty stomach.     ALPRAZolam 0.5 MG tablet  Commonly known as:  XANAX  TAKE ONE TABLET BY  MOUTH TWICE DAILY.     amLODipine 10 MG tablet  Commonly known as:  NORVASC  Take 1 tablet (10 mg total) by mouth daily.     benzonatate 100 MG capsule  Commonly known as:  TESSALON  TAKE ONE CAPSULE BY MOUTH TWICE DAILY AS NEEDED FOR COUGH     cyclobenzaprine 10 MG tablet  Commonly known as:  FLEXERIL  TAKE ONE TABLET BY MOUTH 3 TIMES DAILY AS NEEDED FOR MUSCLE SPASM(S).     diazepam 5 MG tablet  Commonly known as:  VALIUM  TAKE (1) TABLET BY MOUTH TWICE DAILY AS NEEDED.     FISH OIL PO  Take 1 capsule by mouth daily.      HYDROcodone-acetaminophen 10-325 MG tablet  Commonly known as:  NORCO  One three times daily     multivitamin with minerals Tabs tablet  Take 1 tablet by mouth every evening.     potassium chloride 10 MEQ tablet  Commonly known as:  K-DUR  Take 10 mEq by mouth daily.     predniSONE 5 MG tablet  Commonly known as:  DELTASONE  Take 1 tablet (5 mg total) by mouth 2 (two) times daily.     promethazine-dextromethorphan 6.25-15 MG/5ML syrup  Commonly known as:  PROMETHAZINE-DM  One teaspoon at bedtime , as needed, for excessive cough     rosuvastatin 20 MG tablet  Commonly known as:  CRESTOR  TAKE 1 TABLET BY MOUTH ONCE DAILY.     Vitamin D 2000 units Caps  Take 1 capsule by mouth daily.       Allergies  Allergen Reactions  . Calcium-Containing Compounds Other (See Comments)    Pt has hyperparathyroidism which results in hypercalcemia       Follow-up Information    Follow up with Jory Sims, NP On 06/23/2015.   Specialties:  Nurse Practitioner, Radiology, Cardiology   Why:  Appointment with Cardiology Friday June 23, 2015 at 2:30pm   Contact information:   [place] [id number] [phone]        The results of significant diagnostics from this hospitalization (including imaging, microbiology, ancillary and laboratory) are listed below for reference.    Significant Diagnostic Studies: Nm Myocar Multi W/spect W/wall Motion / Ef  05/31/2015   Blood pressure demonstrated a hypertensive response to exercise.  Baseline 0.5 mm horizontal ST depressions inferior leads at rest. Horizontal ST segment depression 59mm depression was noted during stress in the II, III and aVF leads. Nonspecific change from baseline  The study is normal. No perfusion defects consistent with prior infarct or current ischemia.  This is a low risk study.  The left ventricular ejection fraction is hyperdynamic (>65%).    Dg Chest Portable 1 View  05/30/2015  CLINICAL DATA:   Left-sided chest pain EXAM: PORTABLE CHEST 1 VIEW COMPARISON:  11/27/2013 FINDINGS: Normal heart size. Lungs clear. No pneumothorax. No pleural effusion. IMPRESSION: No active disease. Electronically Signed   By: Marybelle Killings M.D.   On: 05/30/2015 15:00    Microbiology: No results found for this or any previous visit (from the past 240 hour(s)).   Labs: Basic Metabolic Panel:  Recent Labs Lab 05/30/15 1445 05/31/15 0139  NA 138 140  K 3.4* 4.1  CL 102 106  CO2 24 26  GLUCOSE 133* 189*  BUN 14 17  CREATININE 1.09* 0.91  CALCIUM 10.2 9.8   Liver Function Tests: No results for input(s): AST, ALT, ALKPHOS, BILITOT, PROT, ALBUMIN in the  last 168 hours. No results for input(s): LIPASE, AMYLASE in the last 168 hours. No results for input(s): AMMONIA in the last 168 hours. CBC:  Recent Labs Lab 05/30/15 1445 05/31/15 0139  WBC 11.5* 6.5  NEUTROABS 5.8 4.7  HGB 16.1* 14.4  HCT 46.9* 42.4  MCV 93.4 94.0  PLT 326 292   Cardiac Enzymes:  Recent Labs Lab 05/30/15 1445 05/30/15 1745 05/30/15 2045 05/31/15 0139  TROPONINI <0.03 <0.03 <0.03 <0.03   BNP: BNP (last 3 results) No results for input(s): BNP in the last 8760 hours.  ProBNP (last 3 results) No results for input(s): PROBNP in the last 8760 hours.  CBG: No results for input(s): GLUCAP in the last 168 hours.     SignedLelon Frohlich  [facility] Pager: [phone] 05/31/2015, 4:51 PM

## 2015-05-31 NOTE — Progress Notes (Signed)
Patient ID: Carla [name], female   DOB: [birth year], 70 y.o.   MRN: [id number]   Normal nuclear stress test, no further cardiac testing planned, we will sign off. She will need f/u in 4-6 weeks after discharge.   Zandra Abts MD

## 2015-05-31 NOTE — Care Management Note (Signed)
Case Management Note  Patient Details  Name: Carla [name] MRN: [id number] Date of Birth: 08-Aug-1945  Subjective/Objective:      Spoke with patient who is from home with spouse. Patient states she is fairly independent and does drive. Patient has a walker at home and denies issues with obtaining home medications.   No home o2 or other DME.  No CM needs identified.      Action/Plan: Home with self care.   Expected Discharge Date:                  Expected Discharge Plan:  Home/Self Care  In-House Referral:     Discharge planning Services  CM Consult  Post Acute Care Choice:    Choice offered to:     DME Arranged:    DME Agency:     HH Arranged:    [place] Agency:     Status of Service:  Completed, signed off  Medicare Important Message Given:    Date Medicare IM Given:    Medicare IM give by:    Date Additional Medicare IM Given:    Additional Medicare Important Message give by:     If discussed at [place] of Stay Meetings, dates discussed:    Additional Comments:  Alvie Heidelberg, RN 05/31/2015, 10:18 AM

## 2015-05-31 NOTE — Progress Notes (Signed)
Central telemetry notified of patient's discharge, telemetry removed.  IV access removed.  Discharge instructions reviewed with patient, questions answered, understanding verbalized.  Discharged via wheelchair accompanied by staff for discharge home in care of family.  Stable at discharge.

## 2015-06-01 LAB — HEMOGLOBIN A1C
Hgb A1c MFr Bld: 6.5 % — ABNORMAL HIGH (ref 4.8–5.6)
Mean Plasma Glucose: 140 mg/dL

## 2015-06-05 ENCOUNTER — Other Ambulatory Visit: Payer: Self-pay | Admitting: Family Medicine

## 2015-06-05 ENCOUNTER — Ambulatory Visit: Payer: PPO | Admitting: Family Medicine

## 2015-06-06 ENCOUNTER — Other Ambulatory Visit: Payer: Self-pay

## 2015-06-06 DIAGNOSIS — M5090 Cervical disc disorder, unspecified, unspecified cervical region: Secondary | ICD-10-CM

## 2015-06-06 MED ORDER — HYDROCODONE-ACETAMINOPHEN 10-325 MG PO TABS: ORAL_TABLET | ORAL | Status: DC

## 2015-06-10 ENCOUNTER — Other Ambulatory Visit: Payer: Self-pay | Admitting: Family Medicine

## 2015-06-19 ENCOUNTER — Other Ambulatory Visit: Payer: Self-pay | Admitting: Family Medicine

## 2015-06-22 ENCOUNTER — Other Ambulatory Visit: Payer: Self-pay | Admitting: Family Medicine

## 2015-06-23 ENCOUNTER — Encounter: Payer: Self-pay | Admitting: Adult Health

## 2015-06-23 ENCOUNTER — Ambulatory Visit (INDEPENDENT_AMBULATORY_CARE_PROVIDER_SITE_OTHER): Payer: PPO | Admitting: Adult Health

## 2015-06-23 VITALS — BP 142/68 | HR 92 | Ht 62.0 in | Wt 138.0 lb

## 2015-06-23 DIAGNOSIS — R609 Edema, unspecified: Secondary | ICD-10-CM | POA: Diagnosis not present

## 2015-06-23 DIAGNOSIS — R52 Pain, unspecified: Secondary | ICD-10-CM | POA: Diagnosis not present

## 2015-06-23 NOTE — Progress Notes (Deleted)
Name: Carla [name]    DOB: Apr 29, 1945  Age: 70 y.o.  MR#: [id number]       PCP:  Tula Nakayama, MD      Insurance: Payor: Tenn[employer] Must / Plan: Tennis Must / Product Type: *No Product type* /   CC:   No chief complaint on file.   VS Filed Vitals:   06/23/15 1438  BP: 142/68  Pulse: 92  Height: 5\' 2"  (1.575 m)  Weight: 138 lb (62.596 kg)  SpO2: 97%    Weights Current Weight  06/23/15 138 lb (62.596 kg)  05/30/15 145 lb (65.772 kg)  05/01/15 138 lb 1.9 oz (62.651 kg)    Blood Pressure  BP Readings from Last 3 Encounters:  06/23/15 142/68  05/31/15 139/66  05/01/15 120/80     Admit date:  (Not on file) Last encounter with RMR:  Visit date not found   Allergy Calcium-containing compounds  Current Outpatient Prescriptions  Medication Sig Dispense Refill  . acyclovir (ZOVIRAX) 800 MG tablet Take 1 tablet (800 mg total) by mouth 2 (two) times daily. 60 tablet 5  . alendronate (FOSAMAX) 70 MG tablet TAKE 1 TABLET EACH WEEK 30 MIN BEFORE BREAKFAST WITH 8 OUNCES OF WATER FOR OSTEOPOROSIS. 4 tablet 4  . ALPRAZolam (XANAX) 0.5 MG tablet TAKE ONE TABLET BY MOUTH TWICE DAILY. 60 tablet 2  . amLODipine (NORVASC) 10 MG tablet TAKE (1) TABLET BY MOUTH DAILY. 30 tablet 0  . benzonatate (TESSALON) 100 MG capsule TAKE ONE CAPSULE BY MOUTH TWICE DAILY AS NEEDED FOR COUGH 14 capsule 0  . Cholecalciferol (VITAMIN D) 2000 UNITS CAPS Take 1 capsule by mouth daily.    . cyclobenzaprine (FLEXERIL) 10 MG tablet TAKE ONE TABLET BY MOUTH 3 TIMES DAILY AS NEEDED FOR MUSCLE SPASM(S). 30 tablet 2  . diazepam (VALIUM) 5 MG tablet TAKE (1) TABLET BY MOUTH TWICE DAILY AS NEEDED. 10 tablet 0  . HYDROcodone-acetaminophen (NORCO) 10-325 MG tablet One three times daily 90 tablet 0  . Multiple Vitamin (MULTIVITAMIN WITH MINERALS) TABS Take 1 tablet by mouth every evening.     . Omega-3 Fatty Acids (FISH OIL PO) Take 1 capsule by mouth daily.    . potassium chloride (K-DUR) 10 MEQ tablet  Take 10 mEq by mouth daily.    . promethazine-dextromethorphan (PROMETHAZINE-DM) 6.25-15 MG/5ML syrup One teaspoon at bedtime , as needed, for excessive cough 180 mL 0  . rosuvastatin (CRESTOR) 20 MG tablet TAKE 1 TABLET BY MOUTH ONCE DAILY. 30 tablet 2   No current facility-administered medications for this visit.    Discontinued Meds:   There are no discontinued medications.  Patient Active Problem List   Diagnosis Date Noted  . Chest pain, rule out acute myocardial infarction 05/30/2015  . Chest pain 05/30/2015  . Western blot positive HSV2 02/12/2015  . Rash and nonspecific skin eruption 08/30/2014  . Right ankle pain 08/30/2014  . Wrist pain, left 08/17/2014  . Hand pain, left 06/15/2014  . Reduced vision 04/23/2014  . GAD (generalized anxiety disorder) 02/13/2014  . Constipation 07/19/2013  . Well controlled type 2 diabetes mellitus with peripheral circulatory disorder ([place]) 03/31/2013  . Seasonal allergies 03/30/2013  . Cervical neck pain with evidence of disc disease 11/03/2012  . Secondary erythrocytosis 08/05/2012  . Hearing loss 04/13/2012  . Carotid bruit 04/13/2012  . Back pain with radiation 10/15/2011  . Emphysema with chronic bronchitis ([place]) 01/17/2011  . Arteriosclerotic cardiovascular disease (ASCVD) 09/17/2009  . PERIPHERAL VASCULAR DISEASE 09/17/2009  . GASTROESOPHAGEAL  REFLUX DISEASE 09/17/2009  . Primary hyperparathyroidism ([place]) 01/20/2009  . Vitamin D deficiency 01/20/2009  . HYPERCALCEMIA 12/30/2008  . Hyperlipidemia LDL goal <100 06/12/2007  . Essential hypertension 06/12/2007  . Osteoporosis 06/12/2007    LABS    Component Value Date/Time   NA 140 05/31/2015 0139   NA 138 05/30/2015 1445   NA 141 05/01/2015 1359   K 4.1 05/31/2015 0139   K 3.4* 05/30/2015 1445   K 4.1 05/01/2015 1359   CL 106 05/31/2015 0139   CL 102 05/30/2015 1445   CL 103 05/01/2015 1359   CO2 26 05/31/2015 0139   CO2 24 05/30/2015 1445   CO2 27 05/01/2015 1359    GLUCOSE 189* 05/31/2015 0139   GLUCOSE 133* 05/30/2015 1445   GLUCOSE 109* 05/01/2015 1359   BUN 17 05/31/2015 0139   BUN 14 05/30/2015 1445   BUN 10 05/01/2015 1359   CREATININE 0.91 05/31/2015 0139   CREATININE 1.09* 05/30/2015 1445   CREATININE 0.87 05/01/2015 1359   CREATININE 0.98 02/02/2015 1537   CREATININE 0.96 08/01/2014 1011   CREATININE 1.10 11/27/2013 1523   CALCIUM 9.8 05/31/2015 0139   CALCIUM 10.2 05/30/2015 1445   CALCIUM 9.9 05/01/2015 1359   CALCIUM 10.3 05/13/2012 1538   CALCIUM 10.3 07/10/2009 2300   CALCIUM 10.2 01/20/2009 2221   GFRNONAA >60 05/31/2015 0139   GFRNONAA 50* 05/30/2015 1445   GFRNONAA 68 05/01/2015 1359   GFRNONAA 59* 02/02/2015 1537   GFRNONAA 61 08/01/2014 1011   GFRNONAA 50* 11/27/2013 1523   GFRAA >60 05/31/2015 0139   GFRAA 58* 05/30/2015 1445   GFRAA 78 05/01/2015 1359   GFRAA 68 02/02/2015 1537   GFRAA 70 08/01/2014 1011   GFRAA 58* 11/27/2013 1523   CMP     Component Value Date/Time   NA 140 05/31/2015 0139   K 4.1 05/31/2015 0139   CL 106 05/31/2015 0139   CO2 26 05/31/2015 0139   GLUCOSE 189* 05/31/2015 0139   BUN 17 05/31/2015 0139   CREATININE 0.91 05/31/2015 0139   CREATININE 0.87 05/01/2015 1359   CALCIUM 9.8 05/31/2015 0139   CALCIUM 10.3 05/13/2012 1538   PROT 7.4 05/01/2015 1359   ALBUMIN 4.6 05/01/2015 1359   AST 41* 05/01/2015 1359   ALT 39* 05/01/2015 1359   ALKPHOS 75 05/01/2015 1359   BILITOT 0.5 05/01/2015 1359   GFRNONAA >60 05/31/2015 0139   GFRNONAA 68 05/01/2015 1359   GFRAA >60 05/31/2015 0139   GFRAA 78 05/01/2015 1359       Component Value Date/Time   WBC 6.5 05/31/2015 0139   WBC 11.5* 05/30/2015 1445   WBC 8.1 05/01/2015 1359   HGB 14.4 05/31/2015 0139   HGB 16.1* 05/30/2015 1445   HGB 15.1* 05/01/2015 1359   HCT 42.4 05/31/2015 0139   HCT 46.9* 05/30/2015 1445   HCT 45.4 05/01/2015 1359   MCV 94.0 05/31/2015 0139   MCV 93.4 05/30/2015 1445   MCV 93.0 05/01/2015 1359    Lipid  Panel     Component Value Date/Time   CHOL 136 05/31/2015 0139   TRIG 263* 05/31/2015 0139   HDL 41 05/31/2015 0139   CHOLHDL 3.3 05/31/2015 0139   VLDL 53* 05/31/2015 0139   LDLCALC 42 05/31/2015 0139   LDLDIRECT 166* 10/11/2009 1129    ABG    Component Value Date/Time   PHART 7.398 04/27/2012 1615   PCO2ART 38.0 04/27/2012 1615   PO2ART 67.8* 04/27/2012 1615   HCO3 22.9 04/27/2012 1615  TCO2 19.2 04/27/2012 1615   ACIDBASEDEF 1.0 04/27/2012 1615   O2SAT 93.6 04/27/2012 1615     Lab Results  Component Value Date   TSH 1.63 05/01/2015   BNP (last 3 results) No results for input(s): BNP in the last 8760 hours.  ProBNP (last 3 results) No results for input(s): PROBNP in the last 8760 hours.  Cardiac Panel (last 3 results) No results for input(s): CKTOTAL, CKMB, TROPONINI, RELINDX in the last 72 hours.  Iron/TIBC/Ferritin/ %Sat No results found for: IRON, TIBC, FERRITIN, IRONPCTSAT   EKG Orders placed or performed during the hospital encounter of 05/30/15  . ED EKG  . ED EKG  . EKG 12-Lead  . EKG 12-Lead  . EKG 12-Lead (at 6am)  . EKG 12-Lead (at 6am)  . EKG 12-Lead  . EKG 12-Lead  . EKG     Prior Assessment and Plan Problem List as of 06/23/2015      Cardiovascular and Mediastinum   Essential hypertension   Last Assessment & Plan 05/01/2015 Office Visit Written 05/08/2015 11:18 AM by Fayrene Helper, MD    Controlled, no change in medication DASH diet and commitment to daily physical activity for a minimum of 30 minutes discussed and encouraged, as a part of hypertension management. The importance of attaining a healthy weight is also discussed.  BP/Weight 05/01/2015 02/02/2015 12/15/2014 11/01/2014 08/17/2014 06/15/2014 [id number]  Systolic BP 123456 [id number] 123456 [id number] 123456 [id number]  Diastolic BP 80 74 82 80 78 72 78  Wt. (Lbs) 138.12 137 - - 134 137.04 139.8  BMI 25.26 25.05 - - 24.5 25.06 25.56            Arteriosclerotic cardiovascular disease (ASCVD)   Last  Assessment & Plan 01/17/2011 Office Visit Written 01/17/2011  2:10 PM by Yehuda Savannah, MD    Stress echocardiogram was negative for ischemia, but demonstrated very poor exercise tolerance.  Despite the patient's protestations, I suggested that she is physically deconditioned and should be walking on a regular basis.      PERIPHERAL VASCULAR DISEASE   Last Assessment & Plan 02/02/2015 Office Visit Written 02/12/2015  8:54 AM by Fayrene Helper, MD    Needs re evaluation of lower extremeties by vascular surgery,mincreased lower ext pain      Well controlled type 2 diabetes mellitus with peripheral circulatory disorder [facility])   Last Assessment & Plan 05/01/2015 Office Visit Written 05/08/2015 11:18 AM by Fayrene Helper, MD    Ms. Joy is reminded of the importance of commitment to daily physical activity for 30 minutes or more, as able and the need to limit carbohydrate intake to 30 to 60 grams per meal to help with blood sugar control.   The need to take medication as prescribed, test blood sugar as directed, and to call between visits if there is a concern that blood sugar is uncontrolled is also discussed.   Ms. Canan is reminded of the importance of daily foot exam, annual eye examination, and good blood sugar, blood pressure and cholesterol control. Needs eye exam  Diabetic Labs Latest Ref Rng 05/01/2015 02/02/2015 08/01/2014 06/15/2014 04/05/2014  HbA1c <5.7 % - 6.5(H) 6.6(H) - 6.4(H)  Microalbumin <2.0 mg/dL - - - 2.4(H) -  Micro/Creat Ratio 0.0 - 30.0 mg/g - - - 17.2 -  Chol 125 - 200 mg/dL 159 - 149 - 230(H)  HDL >=46 mg/dL 55 - 49 - 46  Calc LDL <130 mg/dL 64 - 56 - 122(H)  Triglycerides <150 mg/dL  200(H) - 219(H) - 311(H)  Creatinine 0.60 - 0.93 mg/dL 0.87 0.98 0.96 - 0.92   BP/Weight 05/01/2015 02/02/2015 12/15/2014 11/01/2014 08/17/2014 06/15/2014 [id number]  Systolic BP 123456 [id number] 123456 0000000 123456 [id number]  Diastolic BP 80 74 82 80 78 72 78  Wt. (Lbs) 138.12 137 - - 134 137.04 139.8   BMI 25.26 25.05 - - 24.5 25.06 25.56   Foot/eye exam completion dates Latest Ref Rng 02/02/2015 04/28/2014  Eye Exam No Retinopathy - No Retinopathy  Foot Form Completion - Done -               Respiratory   Emphysema with chronic bronchitis [facility])   Last Assessment & Plan 08/17/2014 Office Visit Edited 08/30/2014 12:43 PM by Fayrene Helper, MD    Started z pack prescribed one day ago along with other meds prescribed of 3 week increased congestion and cough, I have advised her to complete course of therapy      Seasonal allergies   Last Assessment & Plan 03/30/2013 Office Visit Written 05/15/2013 10:18 PM by Fayrene Helper, MD    Uncontrolled, needs to commit to daily med        Digestive   GASTROESOPHAGEAL REFLUX DISEASE   Constipation   Last Assessment & Plan 07/19/2013 Office Visit Written 09/25/2013  2:49 PM by Fayrene Helper, MD    Will need to change med due to formulary coverage High fiber diet, regular exercise and adequate water intake discussed and encouraged also        Endocrine   Primary hyperparathyroidism ([place])     Nervous and Auditory   Hearing loss     Musculoskeletal and Integument   Osteoporosis   Last Assessment & Plan 08/17/2014 Office Visit Written 08/30/2014 12:48 PM by Fayrene Helper, MD    Continue fosamax, calcium and vit D  And daily weight bearing exercise      Cervical neck pain with evidence of disc disease   Last Assessment & Plan 05/01/2015 Office Visit Written 05/08/2015 11:15 AM by Fayrene Helper, MD    Uncontrolled star gabapentin at bedtime , titrate up as tolerated      Rash and nonspecific skin eruption   Last Assessment & Plan 06/15/2014 Office Visit Written 08/30/2014 10:23 AM by Fayrene Helper, MD    Refer dermatology        Other   Vitamin D deficiency   Hyperlipidemia LDL goal <100   Last Assessment & Plan 05/01/2015 Office Visit Written 05/08/2015 11:17 AM by Fayrene Helper, MD    Hyperlipidemia:Low fat  diet discussed and encouraged.   Lipid Panel  Lab Results  Component Value Date   CHOL 159 05/01/2015   HDL 55 05/01/2015   LDLCALC 64 05/01/2015   LDLDIRECT 166* 10/11/2009   TRIG 200* 05/01/2015   CHOLHDL 2.9 05/01/2015   Uncontrolled need to change diet and commit to medication         HYPERCALCEMIA   Back pain with radiation   Last Assessment & Plan 05/01/2015 Office Visit Written 05/08/2015 11:16 AM by Fayrene Helper, MD    No change in current meds, add gabapentin      Carotid bruit   Secondary erythrocytosis   GAD (generalized anxiety disorder)   Last Assessment & Plan 05/01/2015 Office Visit Written 05/08/2015 11:17 AM by Fayrene Helper, MD    Improved, continue current med      Reduced vision   Last Assessment & Plan 04/20/2014  Office Visit Written 04/23/2014 10:32 PM by Fayrene Helper, MD    20/2000 in right eye referred to opthalmology      Hand pain, left   Last Assessment & Plan 06/15/2014 Office Visit Written 08/30/2014 10:17 AM by Fayrene Helper, MD    Acute pain with reduced mobility, xray today      Wrist pain, left   Last Assessment & Plan 08/17/2014 Office Visit Written 08/30/2014 12:36 PM by Fayrene Helper, MD    Unimproved  and worsening, likely tenosynovitis, refer to ortho for management      Right ankle pain   Last Assessment & Plan 06/15/2014 Office Visit Written 08/30/2014 10:29 AM by Fayrene Helper, MD    X ray to further evaluate increased pain x 1 month      Western blot positive HSV2   Last Assessment & Plan 02/02/2015 Office Visit Written 02/12/2015  9:01 AM by Fayrene Helper, MD    Current sourse of stress , anxiety and depression which will leessen over time, dx is new , within the past 3 months, and pt is totally unaccepting of the implication      Chest pain, rule out acute myocardial infarction   Chest pain       Imaging: Nm Myocar Multi W/spect W/wall Motion / Ef  05/31/2015   Blood pressure demonstrated a  hypertensive response to exercise.  Baseline 0.5 mm horizontal ST depressions inferior leads at rest. Horizontal ST segment depression 47mm depression was noted during stress in the II, III and aVF leads. Nonspecific change from baseline  The study is normal. No perfusion defects consistent with prior infarct or current ischemia.  This is a low risk study.  The left ventricular ejection fraction is hyperdynamic (>65%).    Dg Chest Portable 1 View  05/30/2015  CLINICAL DATA:  Left-sided chest pain EXAM: PORTABLE CHEST 1 VIEW COMPARISON:  11/27/2013 FINDINGS: Normal heart size. Lungs clear. No pneumothorax. No pleural effusion. IMPRESSION: No active disease. Electronically Signed   By: Marybelle Killings M.D.   On: 05/30/2015 15:00

## 2015-06-23 NOTE — Progress Notes (Signed)
Cardiology Office Note   Date:  06/23/2015   ID:  Carla, [name] [birth year], MRN [id number]  PCP:  Carla Nakayama, MD  Cardiologist: Carla Spring, NP   Chief Complaint  Patient presents with  . Edema      History of Present Illness: Carla [name] is a 70 y.o. female who presents for ongoing assessment and management of chest pain, history of nonobstructive CAD per cath in 1997, hypertension, hyperlipidemia, peripheral vascular disease, status post aortobifem graft, other history including COPD, anxiety, and depression.  She was recently admitted to the hospital in March of 2017 with recurrent chest pain, radiating to the left elbow.we saw her on consult and had a stress test completed, which revealed normal nuclear stress test.  No further cardiac testing was planned.  She comes today with complaints of lower extremity edema, left greater than right, with left ankle pain, and edema.  She denies injury to the ankle, varicosities, or history of DVT.  She denies recurrent chest pain.  She has chronic dyspnea with COPD.   Past Medical History  Diagnosis Date  . ASCVD (arteriosclerotic cardiovascular disease)     No critical disease on cath in 8/97: mild AI with trival, if any l AS; negative stress nuclear in 2010  . Peripheral vascular disease ([place])     stauts post aortabifemoral graft    . Tobacco abuse     has stopped  . Chronic bronchitis   . Hypertension   . Fasting hyperglycemia   . Elevated hemoglobin A1c   . Hydronephrosis   . GERD (gastroesophageal reflux disease)   . Back pain   . Depression   . Osteoporosis   . Secondary erythrocytosis 08/05/2012    Secondary to COPD  . Cervical disc herniation   . Colon polyps     Past Surgical History  Procedure Laterality Date  . Cholecystectomy    . Aorta bifem graft    . Right kidney surgery following damage during arterial surgery    . Partial hysterectomy    . Total abdominal hysterectomy w/  bilateral salpingoophorectomy  1975  . Lysis of adhesions  2000  . Abdominal hysterectomy    . Colonoscopy  06/23/2006    Dr. Fields:Normal colon without evidence of polyps, masses, inflammatory changes/Normal retroflex view of the rectum  . Colonoscopy N/A 08/24/2013    Procedure: COLONOSCOPY;  Surgeon: Danie Binder, MD;  Location: [facility];  Service: Endoscopy;  Laterality: N/A;  10:30-moved to [place] notified pt     Current Outpatient Prescriptions  Medication Sig Dispense Refill  . alendronate (FOSAMAX) 70 MG tablet TAKE 1 TABLET EACH WEEK 30 MIN BEFORE BREAKFAST WITH 8 OUNCES OF WATER FOR OSTEOPOROSIS. 4 tablet 4  . ALPRAZolam (XANAX) 0.5 MG tablet TAKE ONE TABLET BY MOUTH TWICE DAILY. 60 tablet 2  . amLODipine (NORVASC) 10 MG tablet TAKE (1) TABLET BY MOUTH DAILY. 30 tablet 0  . benzonatate (TESSALON) 100 MG capsule TAKE ONE CAPSULE BY MOUTH TWICE DAILY AS NEEDED FOR COUGH 14 capsule 0  . Cholecalciferol (VITAMIN D) 2000 UNITS CAPS Take 1 capsule by mouth daily.    . cyclobenzaprine (FLEXERIL) 10 MG tablet TAKE ONE TABLET BY MOUTH 3 TIMES DAILY AS NEEDED FOR MUSCLE SPASM(S). 30 tablet 2  . diazepam (VALIUM) 5 MG tablet TAKE (1) TABLET BY MOUTH TWICE DAILY AS NEEDED. 10 tablet 0  . HYDROcodone-acetaminophen (NORCO) 10-325 MG tablet One three times daily 90 tablet 0  .  Multiple Vitamin (MULTIVITAMIN WITH MINERALS) TABS Take 1 tablet by mouth every evening.     . Omega-3 Fatty Acids (FISH OIL PO) Take 1 capsule by mouth daily.    . potassium chloride (K-DUR) 10 MEQ tablet Take 10 mEq by mouth daily.    . promethazine-dextromethorphan (PROMETHAZINE-DM) 6.25-15 MG/5ML syrup One teaspoon at bedtime , as needed, for excessive cough 180 mL 0  . rosuvastatin (CRESTOR) 20 MG tablet TAKE 1 TABLET BY MOUTH ONCE DAILY. 30 tablet 2  . acyclovir (ZOVIRAX) 800 MG tablet TAKE ONE TABLET BY MOUTH 2 TIMES DAILY. 60 tablet 0   No current facility-administered medications for this visit.     Allergies:   Calcium-containing compounds    Social History:  The patient  reports that she has quit smoking. Her smoking use included Cigarettes. She smoked 0.50 packs per day. She quit smokeless tobacco use about 3 years ago. She reports that she does not drink alcohol or use illicit drugs.   Family History:  The patient's family history includes COPD in her sister and sister; Cancer (age of onset: 23) in her sister; Coronary artery disease in her sister; Diabetes in her sister, sister, and sister; Heart attack in her father; Heart disease in her sister. There is no history of Colon cancer.    ROS: All other systems are reviewed and negative. Unless otherwise mentioned in H&P    PHYSICAL EXAM: VS:  BP 142/68 mmHg  Pulse 92  Ht 5\' 2"  (1.575 m)  Wt 138 lb (62.596 kg)  BMI 25.23 kg/m2  SpO2 97% , BMI Body mass index is 25.23 kg/(m^2). GEN: Well nourished, well developed, in no acute distress HEENT: normal Neck: no JVD, carotid bruits, or masses Cardiac: RRR; no murmurs, rubs, or gallops,no edema  Respiratory:  Diminished in the bases, with mild wheezing. GI: soft, nontender, nondistended, + BS MS: no deformity or atrophyleft ankle, sore to touch with range of motion, mild nonpitting edema of the left leg, does appear larger than the right.  She has good pulses.  It is noted that she does have some thickened nails on the great toe with discoloration. Skin: warm and dry, no rash Neuro:  Strength and sensation are intact Psych: euthymic mood, full affect    Recent Labs: 05/01/2015: ALT 39*; TSH 1.63 05/31/2015: BUN 17; Creatinine, Ser 0.91; Hemoglobin 14.4; Platelets 292; Potassium 4.1; Sodium 140    Lipid Panel    Component Value Date/Time   CHOL 136 05/31/2015 0139   TRIG 263* 05/31/2015 0139   HDL 41 05/31/2015 0139   CHOLHDL 3.3 05/31/2015 0139   VLDL 53* 05/31/2015 0139   LDLCALC 42 05/31/2015 0139   LDLDIRECT 166* 10/11/2009 1129      Wt Readings from Last 3  Encounters:  06/23/15 138 lb (62.596 kg)  05/30/15 145 lb (65.772 kg)  05/01/15 138 lb 1.9 oz (62.651 kg)     ASSESSMENT AND PLAN:  1. Left leg pain, with mild edema: Him to order a vascular Doppler ultrasound, unilateral on the left to evaluate for vascular insufficiency.  Will also do a left ankle x-ray to evaluate for a stress fracture versus arthritis.  These will be sent to her primary care physician to treat.  She requests pain medication, which I will not prescribe.  She can have Tylenol to assist with pain control.  2. Hypertension: blood pressure is well-controlled.  Will not make any changes in her medication regimen at this time.  She does take amlodipine, which  can cause dependent edema, which I have explained to her.   Current medicines are reviewed at length with the patient today.    Labs/ tests ordered today include: vascular ultrasound of the left leg with a left ankle x-ray.  Orders Placed This Encounter  Procedures  . DG Ankle Complete Left     Disposition:   FU with  6 months unless symptomatic. Signed, Jory Sims, NP  06/23/2015 4:08 PM    [facility] [address], [place], [place] [address] Phone: [phone]; Fax: [phone]

## 2015-06-23 NOTE — Patient Instructions (Signed)
Your physician wants you to follow-up in: 6 Months.  You will receive a reminder letter in the mail two months in advance. If you don't receive a letter, please call our office to schedule the follow-up appointment.  Your physician recommends that you continue on your current medications as directed. Please refer to the Current Medication list given to you today.  Your physician has requested that you have a lower or upper extremity venous duplex. This test is an ultrasound of the veins in the legs or arms. It looks at venous blood flow that carries blood from the heart to the legs or arms. Allow one hour for a Lower Venous exam. Allow thirty minutes for an Upper Venous exam. There are no restrictions or special instructions.   If you need a refill on your cardiac medications before your next appointment, please call your pharmacy.  Thank you for choosing [facility]!

## 2015-06-26 ENCOUNTER — Other Ambulatory Visit: Payer: Self-pay | Admitting: *Deleted

## 2015-06-26 DIAGNOSIS — M79605 Pain in left leg: Secondary | ICD-10-CM

## 2015-06-28 ENCOUNTER — Ambulatory Visit (HOSPITAL_COMMUNITY)
Admission: RE | Admit: 2015-06-28 | Discharge: 2015-06-28 | Disposition: A | Discharge status: Home / Self Care | Payer: PPO | Source: Ambulatory Visit | Attending: Adult Health | Admitting: Adult Health

## 2015-06-28 DIAGNOSIS — M79605 Pain in left leg: Secondary | ICD-10-CM | POA: Diagnosis not present

## 2015-06-28 DIAGNOSIS — M7989 Other specified soft tissue disorders: Secondary | ICD-10-CM | POA: Diagnosis not present

## 2015-06-29 ENCOUNTER — Telehealth: Payer: Self-pay | Admitting: *Deleted

## 2015-06-29 NOTE — Telephone Encounter (Signed)
Called patient with test results. No answer. Left message to call back.  

## 2015-06-29 NOTE — Telephone Encounter (Signed)
-----   Message from Lendon Colonel, NP sent at 06/29/2015  7:09 AM EDT ----- No evidence of DVT or other abnormalities in left lower extremity. No changes in her regimen. May be normal variant. No need to do any further testing.

## 2015-07-06 ENCOUNTER — Encounter: Payer: Self-pay | Admitting: Family Medicine

## 2015-07-06 ENCOUNTER — Ambulatory Visit (INDEPENDENT_AMBULATORY_CARE_PROVIDER_SITE_OTHER): Payer: PPO | Admitting: Family Medicine

## 2015-07-06 ENCOUNTER — Other Ambulatory Visit: Payer: Self-pay

## 2015-07-06 VITALS — BP 120/68 | HR 97 | Resp 16 | Ht 62.0 in | Wt 137.0 lb

## 2015-07-06 DIAGNOSIS — I10 Essential (primary) hypertension: Secondary | ICD-10-CM

## 2015-07-06 DIAGNOSIS — B351 Tinea unguium: Secondary | ICD-10-CM

## 2015-07-06 DIAGNOSIS — E785 Hyperlipidemia, unspecified: Secondary | ICD-10-CM | POA: Diagnosis not present

## 2015-07-06 DIAGNOSIS — E1151 Type 2 diabetes mellitus with diabetic peripheral angiopathy without gangrene: Secondary | ICD-10-CM | POA: Diagnosis not present

## 2015-07-06 DIAGNOSIS — B009 Herpesviral infection, unspecified: Secondary | ICD-10-CM

## 2015-07-06 DIAGNOSIS — M549 Dorsalgia, unspecified: Secondary | ICD-10-CM | POA: Diagnosis not present

## 2015-07-06 MED ORDER — ROSUVASTATIN CALCIUM 20 MG PO TABS: 20.0000 mg | ORAL_TABLET | Freq: Every day | ORAL | Status: DC

## 2015-07-06 MED ORDER — GABAPENTIN 100 MG PO CAPS: 100.0000 mg | ORAL_CAPSULE | Freq: Every day | ORAL | Status: DC

## 2015-07-06 MED ORDER — HYDROCODONE-ACETAMINOPHEN 10-325 MG PO TABS: ORAL_TABLET | ORAL | Status: DC

## 2015-07-06 MED ORDER — TERBINAFINE HCL 250 MG PO TABS: 250.0000 mg | ORAL_TABLET | Freq: Every day | ORAL | Status: DC

## 2015-07-06 NOTE — Progress Notes (Signed)
Subjective:    Patient ID: Carla [name], female    DOB: 17-Nov-1945, 70 y.o.   MRN: [id number]  HPI   Carla [name]     MRN: [id number]      DOB: 1946-01-30   HPI Carla [name] is here for follow up and re-evaluation of chronic medical conditions, medication management and review of any available recent lab and radiology data.  Preventive health is updated, specifically  Cancer screening and Immunization.   Questions or concerns regarding consultations or procedures which the PT has had in the interim are  Addressed.Has had full cardiac clearance The PT denies any adverse reactions to current medications since the last visitStates hydrocodone not effective in treating her back pain, wants a change C/o leg swelling and thickened toenails   ROS Denies recent fever or chills. Denies sinus pressure, nasal congestion, ear pain or sore throat. Denies chest congestion, productive cough or wheezing. Denies chest pains, palpitations and leg swelling Denies abdominal pain, nausea, vomiting,diarrhea or constipation.   Denies dysuria, frequency, hesitancy or incontinence.  Denies headaches, seizures, numbness, or tingling. Denies depression, c/o anand r insomnia.  PE  BP 120/68 mmHg  Pulse 97  Resp 16  Ht 5\' 2"  (1.575 m)  Wt 137 lb (62.143 kg)  BMI 25.05 kg/m2  SpO2 96%  Patient alert and oriented and in no cardiopulmonary distress.  HEENT: No facial asymmetry, EOMI,   oropharynx pink and moist.  Neck supple no JVD, no mass.  Chest: Clear to auscultation bilaterally.  CVS: S1, S2 no murmurs, no S3.Regular rate.  ABD: Soft non tender.   Ext: No edema  MS: Adequate ROM spine, shoulders, hips and knees.  Skin: Intact, no ulcerations or rash noted.bilateral onychomycosis  Psych: Good eye contact, normal affect. Memory intact mildly anxious or depressed appearing.  CNS: CN 2-12 intact, power,  normal throughout.no focal deficits noted.   Assessment & Plan   Essential  hypertension Controlled, no change in medication DASH diet and commitment to daily physical activity for a minimum of 30 minutes discussed and encouraged, as a part of hypertension management. The importance of attaining a healthy weight is also discussed.  BP/Weight 07/06/2015 06/23/2015 05/31/2015 05/30/2015 05/01/2015 02/02/2015 0000000  Systolic BP 123456 [id number] - 123456 [id number]  Diastolic BP 68 68 66 - 80 74 82  Wt. (Lbs) 137 138 - 145 138.12 137 -  BMI 25.05 25.23 - 25.69 25.26 25.05 -        Well controlled type 2 diabetes mellitus with peripheral circulatory disorder (HCC) Controlled, no change in medication Carla [name] is reminded of the importance of commitment to daily physical activity for 30 minutes or more, as able and the need to limit carbohydrate intake to 30 to 60 grams per meal to help with blood sugar control.   .   Carla [name] is reminded of the importance of daily foot exam, annual eye examination, and good blood sugar, blood pressure and cholesterol control.  Diabetic Labs Latest Ref Rng 05/31/2015 05/30/2015 05/01/2015 02/02/2015 08/01/2014  HbA1c 4.8 - 5.6 % 6.5(H) - - 6.5(H) 6.6(H)  Microalbumin <2.0 mg/dL - - - - -  Micro/Creat Ratio 0.0 - 30.0 mg/g - - - - -  Chol 0 - 200 mg/dL 136 - 159 - 149  HDL >40 mg/dL 41 - 55 - 49  Calc LDL 0 - 99 mg/dL 42 - 64 - 56  Triglycerides <150 mg/dL 263(H) - 200(H) - 219(H)  Creatinine 0.44 - 1.00  mg/dL 0.91 1.09(H) 0.87 0.98 0.96   BP/Weight 07/06/2015 06/23/2015 05/31/2015 05/30/2015 05/01/2015 02/02/2015 0000000  Systolic BP 123456 [id number] - 123456 [id number]  Diastolic BP 68 68 66 - 80 74 82  Wt. (Lbs) 137 138 - 145 138.12 137 -  BMI 25.05 25.23 - 25.69 25.26 25.05 -   Foot/eye exam completion dates Latest Ref Rng 02/02/2015 04/28/2014  Eye Exam No Retinopathy - No Retinopathy  Foot Form Completion - Done -         Hyperlipidemia LDL goal <100 Controlled, no change in medication Hyperlipidemia:Low fat diet discussed and  encouraged.   Elevated TG needs to reduce fat intake      Western blot positive HSV2 Still a cause of anxiety and personal stress, takes daily acyclovir  Back pain with radiation Reports poor control and states hydrocodone of no benefit. Opportunity siezed to reduce hydrocodone and start gabapentin with a lot of pt ed done as to the benfit of this approach, goal is to d/c hydrocodone entirely, she has been on this for over 20 years so gradual taper planned  Onychomycosis Terbinafine prescribed for 3 months       Review of Systems     Objective:   Physical Exam        Assessment & Plan:

## 2015-07-06 NOTE — Patient Instructions (Addendum)
Wellness in 4.5 month, call if you need me sooner  Excellent labs  Change in pain medication,, 2 pain pills daily and one new one, gabapentin at bedtime   KEEP working on [facility]!  Terbinafine sent for fungal toenail infection  Thank you  for choosing [facility]. We consider it a privelige to serve you.  Delivering excellent health care in a caring and  compassionate way is our goal.  Partnering with you,  so that together we can achieve this goal is our strategy.

## 2015-07-07 ENCOUNTER — Ambulatory Visit (HOSPITAL_COMMUNITY)
Admission: RE | Admit: 2015-07-07 | Discharge: 2015-07-07 | Disposition: A | Discharge status: Home / Self Care | Payer: PPO | Source: Ambulatory Visit | Attending: Family Medicine | Admitting: Family Medicine

## 2015-07-07 DIAGNOSIS — Z1231 Encounter for screening mammogram for malignant neoplasm of breast: Secondary | ICD-10-CM | POA: Insufficient documentation

## 2015-07-08 DIAGNOSIS — B351 Tinea unguium: Secondary | ICD-10-CM | POA: Insufficient documentation

## 2015-07-08 HISTORY — DX: Tinea unguium: B35.1

## 2015-07-08 NOTE — Assessment & Plan Note (Signed)
Controlled, no change in medication Carla [name] is reminded of the importance of commitment to daily physical activity for 30 minutes or more, as able and the need to limit carbohydrate intake to 30 to 60 grams per meal to help with blood sugar control.   .   Carla [name] is reminded of the importance of daily foot exam, annual eye examination, and good blood sugar, blood pressure and cholesterol control.  Diabetic Labs Latest Ref Rng 05/31/2015 05/30/2015 05/01/2015 02/02/2015 08/01/2014  HbA1c 4.8 - 5.6 % 6.5(H) - - 6.5(H) 6.6(H)  Microalbumin <2.0 mg/dL - - - - -  Micro/Creat Ratio 0.0 - 30.0 mg/g - - - - -  Chol 0 - 200 mg/dL 136 - 159 - 149  HDL >40 mg/dL 41 - 55 - 49  Calc LDL 0 - 99 mg/dL 42 - 64 - 56  Triglycerides <150 mg/dL 263(H) - 200(H) - 219(H)  Creatinine 0.44 - 1.00 mg/dL 0.91 1.09(H) 0.87 0.98 0.96   BP/Weight 07/06/2015 06/23/2015 05/31/2015 05/30/2015 05/01/2015 02/02/2015 0000000  Systolic BP 123456 [id number] - 123456 [id number]  Diastolic BP 68 68 66 - 80 74 82  Wt. (Lbs) 137 138 - 145 138.12 137 -  BMI 25.05 25.23 - 25.69 25.26 25.05 -   Foot/eye exam completion dates Latest Ref Rng 02/02/2015 04/28/2014  Eye Exam No Retinopathy - No Retinopathy  Foot Form Completion - Done -

## 2015-07-08 NOTE — Assessment & Plan Note (Addendum)
Controlled, no change in medication Hyperlipidemia:Low fat diet discussed and encouraged.   Elevated TG needs to reduce fat intake

## 2015-07-08 NOTE — Assessment & Plan Note (Signed)
Terbinafine prescribed for 3 months

## 2015-07-08 NOTE — Assessment & Plan Note (Signed)
Reports poor control and states hydrocodone of no benefit. Opportunity siezed to reduce hydrocodone and start gabapentin with a lot of pt ed done as to the benfit of this approach, goal is to d/c hydrocodone entirely, she has been on this for over 20 years so gradual taper planned

## 2015-07-08 NOTE — Assessment & Plan Note (Signed)
Controlled, no change in medication DASH diet and commitment to daily physical activity for a minimum of 30 minutes discussed and encouraged, as a part of hypertension management. The importance of attaining a healthy weight is also discussed.  BP/Weight 07/06/2015 06/23/2015 05/31/2015 05/30/2015 05/01/2015 02/02/2015 0000000  Systolic BP 123456 [id number] - 123456 [id number]  Diastolic BP 68 68 66 - 80 74 82  Wt. (Lbs) 137 138 - 145 138.12 137 -  BMI 25.05 25.23 - 25.69 25.26 25.05 -

## 2015-07-08 NOTE — Assessment & Plan Note (Signed)
Still a cause of anxiety and personal stress, takes daily acyclovir

## 2015-07-10 ENCOUNTER — Other Ambulatory Visit: Payer: Self-pay | Admitting: Family Medicine

## 2015-07-11 ENCOUNTER — Other Ambulatory Visit: Payer: Self-pay | Admitting: Family Medicine

## 2015-07-11 LAB — MICROALBUMIN / CREATININE URINE RATIO
CREATININE, URINE: 544 mg/dL — AB (ref 20–320)
Microalb Creat Ratio: 48 mcg/mg creat — ABNORMAL HIGH (ref <30)
Microalb, Ur: 25.9 mg/dL

## 2015-07-13 ENCOUNTER — Telehealth: Payer: Self-pay

## 2015-07-13 ENCOUNTER — Other Ambulatory Visit: Payer: Self-pay

## 2015-07-13 MED ORDER — HYDROCORTISONE 2.5 % RE CREA: TOPICAL_CREAM | RECTAL | Status: DC

## 2015-07-13 MED ORDER — HYDROCORTISONE ACE-PRAMOXINE 1-1 % RE CREA: TOPICAL_CREAM | RECTAL | Status: DC

## 2015-07-13 NOTE — Telephone Encounter (Signed)
States she wants a cream to rub on the outside of her vagina because it itches everyday in the evenings and at night and it keeps her from sleeping and the pills don't help

## 2015-07-13 NOTE — Telephone Encounter (Signed)
Medication sent.

## 2015-07-27 ENCOUNTER — Telehealth: Payer: Self-pay | Admitting: Family Medicine

## 2015-07-27 ENCOUNTER — Other Ambulatory Visit: Payer: Self-pay

## 2015-07-27 DIAGNOSIS — M549 Dorsalgia, unspecified: Secondary | ICD-10-CM

## 2015-07-27 MED ORDER — GABAPENTIN 100 MG PO CAPS: 200.0000 mg | ORAL_CAPSULE | Freq: Every day | ORAL | Status: DC

## 2015-07-27 NOTE — Telephone Encounter (Signed)
Patient aware and will increase medication.   Dose increase sent to pharmacy.

## 2015-07-27 NOTE — Telephone Encounter (Signed)
Gabapentin prescribed at 100 mg one at bedtime, increase her gabapentin to TWO at bedtime this WILL address the problem

## 2015-07-27 NOTE — Telephone Encounter (Signed)
Patient is calling stating that since the dosage of her pain medication was lowered she cant sleep at all due to hurting, please advise?

## 2015-08-04 ENCOUNTER — Other Ambulatory Visit: Payer: Self-pay

## 2015-08-04 DIAGNOSIS — M549 Dorsalgia, unspecified: Secondary | ICD-10-CM

## 2015-08-04 MED ORDER — HYDROCODONE-ACETAMINOPHEN 10-325 MG PO TABS: ORAL_TABLET | ORAL | Status: DC

## 2015-08-05 ENCOUNTER — Other Ambulatory Visit: Payer: Self-pay | Admitting: Family Medicine

## 2015-08-31 ENCOUNTER — Other Ambulatory Visit: Payer: Self-pay | Admitting: Family Medicine

## 2015-09-01 ENCOUNTER — Other Ambulatory Visit: Payer: Self-pay

## 2015-09-01 DIAGNOSIS — M549 Dorsalgia, unspecified: Secondary | ICD-10-CM

## 2015-09-01 MED ORDER — HYDROCODONE-ACETAMINOPHEN 10-325 MG PO TABS: ORAL_TABLET | ORAL | Status: DC

## 2015-09-08 ENCOUNTER — Other Ambulatory Visit: Payer: Self-pay | Admitting: Family Medicine

## 2015-09-27 ENCOUNTER — Encounter (HOSPITAL_COMMUNITY): Payer: Self-pay | Admitting: Emergency Medicine

## 2015-09-27 ENCOUNTER — Ambulatory Visit (INDEPENDENT_AMBULATORY_CARE_PROVIDER_SITE_OTHER): Payer: PPO | Admitting: Family Medicine

## 2015-09-27 ENCOUNTER — Emergency Department (HOSPITAL_COMMUNITY): Payer: PPO

## 2015-09-27 ENCOUNTER — Emergency Department (HOSPITAL_COMMUNITY)
Admission: EM | Admit: 2015-09-27 | Discharge: 2015-09-27 | Disposition: A | Discharge status: Home / Self Care | Payer: PPO | Attending: Emergency Medicine | Admitting: Emergency Medicine

## 2015-09-27 ENCOUNTER — Encounter: Payer: Self-pay | Admitting: Family Medicine

## 2015-09-27 ENCOUNTER — Other Ambulatory Visit: Payer: Self-pay

## 2015-09-27 VITALS — BP 100/68 | HR 80 | Resp 18 | Wt 137.0 lb

## 2015-09-27 DIAGNOSIS — M810 Age-related osteoporosis without current pathological fracture: Secondary | ICD-10-CM | POA: Diagnosis not present

## 2015-09-27 DIAGNOSIS — Z79899 Other long term (current) drug therapy: Secondary | ICD-10-CM | POA: Insufficient documentation

## 2015-09-27 DIAGNOSIS — I739 Peripheral vascular disease, unspecified: Secondary | ICD-10-CM | POA: Insufficient documentation

## 2015-09-27 DIAGNOSIS — I2510 Atherosclerotic heart disease of native coronary artery without angina pectoris: Secondary | ICD-10-CM | POA: Insufficient documentation

## 2015-09-27 DIAGNOSIS — R05 Cough: Secondary | ICD-10-CM | POA: Diagnosis not present

## 2015-09-27 DIAGNOSIS — F411 Generalized anxiety disorder: Secondary | ICD-10-CM | POA: Diagnosis not present

## 2015-09-27 DIAGNOSIS — I10 Essential (primary) hypertension: Secondary | ICD-10-CM

## 2015-09-27 DIAGNOSIS — Z7982 Long term (current) use of aspirin: Secondary | ICD-10-CM | POA: Insufficient documentation

## 2015-09-27 DIAGNOSIS — R079 Chest pain, unspecified: Secondary | ICD-10-CM

## 2015-09-27 DIAGNOSIS — Z87891 Personal history of nicotine dependence: Secondary | ICD-10-CM | POA: Diagnosis not present

## 2015-09-27 DIAGNOSIS — F329 Major depressive disorder, single episode, unspecified: Secondary | ICD-10-CM | POA: Diagnosis not present

## 2015-09-27 LAB — CBC
HEMATOCRIT: 42.5 % (ref 36.0–46.0)
HEMOGLOBIN: 14.3 g/dL (ref 12.0–15.0)
MCH: 32.1 pg (ref 26.0–34.0)
MCHC: 33.6 g/dL (ref 30.0–36.0)
MCV: 95.5 fL (ref 78.0–100.0)
Platelets: 237 10*3/uL (ref 150–400)
RBC: 4.45 MIL/uL (ref 3.87–5.11)
RDW: 13.3 % (ref 11.5–15.5)
WBC: 10.4 10*3/uL (ref 4.0–10.5)

## 2015-09-27 LAB — BASIC METABOLIC PANEL
ANION GAP: 8 (ref 5–15)
BUN: 14 mg/dL (ref 6–20)
CHLORIDE: 102 mmol/L (ref 101–111)
CO2: 27 mmol/L (ref 22–32)
Calcium: 9.8 mg/dL (ref 8.9–10.3)
Creatinine, Ser: 0.96 mg/dL (ref 0.44–1.00)
GFR calc Af Amer: >60 mL/min (ref >60)
GFR calc non Af Amer: 59 mL/min — ABNORMAL LOW (ref >60)
GLUCOSE: 156 mg/dL — AB (ref 65–99)
POTASSIUM: 3.8 mmol/L (ref 3.5–5.1)
Sodium: 137 mmol/L (ref 135–145)

## 2015-09-27 LAB — I-STAT TROPONIN, ED: Troponin i, poc: 0 ng/mL (ref 0.00–0.08)

## 2015-09-27 NOTE — ED Notes (Addendum)
Patient complaining of chest pain starting at 1030 today. States pain has resolved since arriving to ER.

## 2015-09-27 NOTE — Discharge Instructions (Signed)
Nonspecific Chest Pain  °Chest pain can be caused by many different conditions. There is always a chance that your pain could be related to something serious, such as a heart attack or a blood clot in your lungs. Chest pain can also be caused by conditions that are not life-threatening. If you have chest pain, it is very important to follow up with your health care provider. °CAUSES  °Chest pain can be caused by: °· Heartburn. °· Pneumonia or bronchitis. °· Anxiety or stress. °· Inflammation around your heart (pericarditis) or lung (pleuritis or pleurisy). °· A blood clot in your lung. °· A collapsed lung (pneumothorax). It can develop suddenly on its own (spontaneous pneumothorax) or from trauma to the chest. °· Shingles infection (varicella-zoster virus). °· Heart attack. °· Damage to the bones, muscles, and cartilage that make up your chest wall. This can include: °¨ Bruised bones due to injury. °¨ Strained muscles or cartilage due to frequent or repeated coughing or overwork. °¨ Fracture to one or more ribs. °¨ Sore cartilage due to inflammation (costochondritis). °RISK FACTORS  °Risk factors for chest pain may include: °· Activities that increase your risk for trauma or injury to your chest. °· Respiratory infections or conditions that cause frequent coughing. °· Medical conditions or overeating that can cause heartburn. °· Heart disease or family history of heart disease. °· Conditions or health behaviors that increase your risk of developing a blood clot. °· Having had chicken pox (varicella zoster). °SIGNS AND SYMPTOMS °Chest pain can feel like: °· Burning or tingling on the surface of your chest or deep in your chest. °· Crushing, pressure, aching, or squeezing pain. °· Dull or sharp pain that is worse when you move, cough, or take a deep breath. °· Pain that is also felt in your back, neck, shoulder, or arm, or pain that spreads to any of these areas. °Your chest pain may come and go, or it may stay  constant. °DIAGNOSIS °Lab tests or other studies may be needed to find the cause of your pain. Your health care provider may have you take a test called an ambulatory ECG (electrocardiogram). An ECG records your heartbeat patterns at the time the test is performed. You may also have other tests, such as: °· Transthoracic echocardiogram (TTE). During echocardiography, sound waves are used to create a picture of all of the heart structures and to look at how blood flows through your heart. °· Transesophageal echocardiogram (TEE). This is a more advanced imaging test that obtains images from inside your body. It allows your health care provider to see your heart in finer detail. °· Cardiac monitoring. This allows your health care provider to monitor your heart rate and rhythm in real time. °· Holter monitor. This is a portable device that records your heartbeat and can help to diagnose abnormal heartbeats. It allows your health care provider to track your heart activity for several days, if needed. °· Stress tests. These can be done through exercise or by taking medicine that makes your heart beat more quickly. °· Blood tests. °· Imaging tests. °TREATMENT  °Your treatment depends on what is causing your chest pain. Treatment may include: °· Medicines. These may include: °¨ Acid blockers for heartburn. °¨ Anti-inflammatory medicine. °¨ Pain medicine for inflammatory conditions. °¨ Antibiotic medicine, if an infection is present. °¨ Medicines to dissolve blood clots. °¨ Medicines to treat coronary artery disease. °· Supportive care for conditions that do not require medicines. This may include: °¨ Resting. °¨ Applying heat   or cold packs to injured areas. °¨ Limiting activities until pain decreases. °HOME CARE INSTRUCTIONS °· If you were prescribed an antibiotic medicine, finish it all even if you start to feel better. °· Avoid any activities that bring on chest pain. °· Do not use any tobacco products, including  cigarettes, chewing tobacco, or electronic cigarettes. If you need help quitting, ask your health care provider. °· Do not drink alcohol. °· Take medicines only as directed by your health care provider. °· Keep all follow-up visits as directed by your health care provider. This is important. This includes any further testing if your chest pain does not go away. °· If heartburn is the cause for your chest pain, you may be told to keep your head raised (elevated) while sleeping. This reduces the chance that acid will go from your stomach into your esophagus. °· Make lifestyle changes as directed by your health care provider. These may include: °¨ Getting regular exercise. Ask your health care provider to suggest some activities that are safe for you. °¨ Eating a heart-healthy diet. A registered dietitian can help you to learn healthy eating options. °¨ Maintaining a healthy weight. °¨ Managing diabetes, if necessary. °¨ Reducing stress. °SEEK MEDICAL CARE IF: °· Your chest pain does not go away after treatment. °· You have a rash with blisters on your chest. °· You have a fever. °SEEK IMMEDIATE MEDICAL CARE IF:  °· Your chest pain is worse. °· You have an increasing cough, or you cough up blood. °· You have severe abdominal pain. °· You have severe weakness. °· You faint. °· You have chills. °· You have sudden, unexplained chest discomfort. °· You have sudden, unexplained discomfort in your arms, back, neck, or jaw. °· You have shortness of breath at any time. °· You suddenly start to sweat, or your skin gets clammy. °· You feel nauseous or you vomit. °· You suddenly feel light-headed or dizzy. °· Your heart begins to beat quickly, or it feels like it is skipping beats. °These symptoms may represent a serious problem that is an emergency. Do not wait to see if the symptoms will go away. Get medical help right away. Call your local emergency services (911 in the [place] Do not drive yourself to the hospital. °  °This  information is not intended to replace advice given to you by your health care provider. Make sure you discuss any questions you have with your health care provider. °  °Document Released: 12/19/2004 Document Revised: 04/01/2014 Document Reviewed: 10/15/2013 °Elsevier Interactive Patient Education ©2016 [employer] ° °

## 2015-09-27 NOTE — Patient Instructions (Signed)
You need to go directly to the ED for further evaluation, I have spoken with the Doctor there  EKG here shows no heart damage, however , because of your symptoms you need further evaluation  F/u early August, appointment will be mailed to you

## 2015-09-27 NOTE — Progress Notes (Signed)
   Subjective:    Patient ID: Carla [name], female    DOB: 01/25/1946, 70 y.o.   MRN: [id number]  HPI Pt was in her regular state of health up until this morning, woke up feeling well.Around midday, felt like a big blow in the epigastric area, she started experincing burning pain which has perisisted and escalated up to an 8 to 10. States she feels as though she will apss out, does not feel well, and though ghjas c/o of chest pain in the past, states she has " never felt like this before" Pain is non radiating, mild nausea, no seating Was actually hospitalized in March for chest pain and amanged by cardiology at that time Has chronic anxiety and depression which is inadequately treated as she refuses medication offered and therapy Review of Systems See HPI Denies recent fever or chills. Denies sinus pressure, nasal congestion, ear pain or sore throat. Denies chest congestion, productive cough or wheezing.  Denies abdominal pain, nausea, vomiting,diarrhea or constipation.   Denies dysuria, frequency, hesitancy or incontinence. C/o chronic back pain Denies headaches, seizures, numbness, or tingling.  Denies skin break down or rash.        Objective:   Physical Exam BP 100/68 mmHg  Pulse 80  Resp 18  Wt 137 lb (62.143 kg)  SpO2 98% Patient alert and oriented and in no cardiopulmonary distress.  HEENT: No facial asymmetry, EOMI,   oropharynx pink and moist.  Neck supple no JVD, no mass.  Chest: Clear to auscultation bilaterally.Dcreased though adequate air entry  No reproducible chest wall tenderness  CVS: S1, S2 no murmurs, no S3.Regular rate. EKG : NSR, no ischemia , no LVH ABD: Soft non tender.   Ext: No edema  MS: Adequate ROM spine, shoulders, hips and knees.  Skin: Intact, no ulcerations or rash noted.  Psych: Good eye contact, normal affect. Memory intact anxiousnot depressed. CNS: CN 2-12 intact, power,  normal throughout.no focal deficits noted.         Assessment & Plan:  Chest pain Acute chest pain x 1 hour, rated at an 8, associated with symptom of light headedness and nausea as if she will [pass out, states she "has never felt this way before" Office EKG shows no ischemia , LVH or irregular heart rate , it is normal, however based on symptoms reported , pt directed to ED for further eval, MD physician aware  Essential hypertension Controlled, no change in medication   GAD (generalized anxiety disorder) Uncontrolled, , needs to commit to medication for symptom control other than benzos, has resisted psych management, but it is increasingly evident that she needs this, will re address at next visit

## 2015-09-27 NOTE — ED Provider Notes (Signed)
CSN: [id number]     Arrival date & time 09/27/15  1341 History   First MD Initiated Contact with Patient 09/27/15 1351     Chief Complaint  Patient presents with  . Chest Pain     HPI Patient presents with chest pain. It is in her mid chest. It is sharp. Then going since this morning. Sharp. States that it is gone now. She's had a cough for 4 years which is very much unchanged. No nausea vomiting diaphoresis. Not associated with exertion. No change with eating. She had admission to hospital around 4 months ago for chest pain rule out had a negative stress test that time. Has had a heart cath but never a stent. No swelling or legs. No recent travel.   Past Medical History  Diagnosis Date  . ASCVD (arteriosclerotic cardiovascular disease)     No critical disease on cath in 8/97: mild AI with trival, if any l AS; negative stress nuclear in 2010  . Peripheral vascular disease ([place])     stauts post aortabifemoral graft    . Tobacco abuse     has stopped  . Chronic bronchitis   . Hypertension   . Fasting hyperglycemia   . Elevated hemoglobin A1c   . Hydronephrosis   . GERD (gastroesophageal reflux disease)   . Back pain   . Depression   . Osteoporosis   . Secondary erythrocytosis 08/05/2012    Secondary to COPD  . Cervical disc herniation   . Colon polyps    Past Surgical History  Procedure Laterality Date  . Cholecystectomy    . Aorta bifem graft    . Right kidney surgery following damage during arterial surgery    . Partial hysterectomy    . Total abdominal hysterectomy w/ bilateral salpingoophorectomy  1975  . Lysis of adhesions  2000  . Abdominal hysterectomy    . Colonoscopy  06/23/2006    Dr. Fields:Normal colon without evidence of polyps, masses, inflammatory changes/Normal retroflex view of the rectum  . Colonoscopy N/A 08/24/2013    Procedure: COLONOSCOPY;  Surgeon: Danie Binder, MD;  Location: [facility];  Service: Endoscopy;  Laterality: N/A;  10:30-moved to [place] notified pt   Family History  Problem Relation Age of Onset  . Heart attack Father   . COPD Sister   . Heart disease Sister   . Diabetes Sister   . Coronary artery disease Sister   . Cancer Sister 66    liver  . Diabetes Sister   . Diabetes Sister   . COPD Sister   . Colon cancer Neg Hx    Social History  Substance Use Topics  . Smoking status: Former Smoker -- 0.50 packs/day    Types: Cigarettes  . Smokeless tobacco: Former Systems developer    Quit date: 05/12/2012  . Alcohol Use: No   OB History    No data available     Review of Systems  Constitutional: Negative for activity change and appetite change.  HENT: Negative for trouble swallowing.   Eyes: Negative for pain.  Respiratory: Positive for cough. Negative for chest tightness and shortness of breath.   Cardiovascular: Positive for chest pain. Negative for leg swelling.  Gastrointestinal: Negative for nausea, abdominal pain and diarrhea.  Genitourinary: Negative for flank pain.  Musculoskeletal: Negative for back pain and neck stiffness.  Skin: Negative for rash.  Neurological: Negative for weakness, numbness and headaches.  Psychiatric/Behavioral: Negative for behavioral problems.  Allergies  Calcium-containing compounds  Home Medications   Prior to Admission medications   Medication Sig Start Date End Date Taking? Authorizing Provider  acyclovir (ZOVIRAX) 800 MG tablet TAKE ONE TABLET BY MOUTH 2 TIMES DAILY. 06/23/15   Fayrene Helper, MD  alendronate (FOSAMAX) 70 MG tablet TAKE 1 TABLET EACH WEEK 30 MIN BEFORE BREAKFAST WITH 8 OUNCES OF WATER FOR OSTEOPOROSIS. 06/05/15   Fayrene Helper, MD  ALPRAZolam Duanne Moron) 0.5 MG tablet TAKE ONE TABLET BY MOUTH TWICE DAILY. 09/08/15   Fayrene Helper, MD  amLODipine (NORVASC) 10 MG tablet TAKE (1) TABLET BY MOUTH DAILY. 07/11/15   Fayrene Helper, MD  Cholecalciferol (VITAMIN D) 2000 UNITS CAPS Take 1 capsule by mouth daily.    Historical Provider, MD   cyclobenzaprine (FLEXERIL) 10 MG tablet TAKE ONE TABLET BY MOUTH 3 TIMES DAILY AS NEEDED FOR MUSCLE SPASM(S). 05/26/14   Fayrene Helper, MD  gabapentin (NEURONTIN) 100 MG capsule Take 2 capsules (200 mg total) by mouth at bedtime. 07/27/15   Fayrene Helper, MD  HYDROcodone-acetaminophen [facility]) 10-325 MG tablet One tablet twice daily 09/01/15   Fayrene Helper, MD  hydrocortisone (ANUSOL-HC) 2.5 % rectal cream APPLY SPARINGLY TO VAGINAL AREA TWICE WEEKLY AS NEEDED FOR ITCHING. 08/31/15   Fayrene Helper, MD  Multiple Vitamin (MULTIVITAMIN WITH MINERALS) TABS Take 1 tablet by mouth every evening.     Historical Provider, MD  Omega-3 Fatty Acids (FISH OIL PO) Take 1 capsule by mouth daily.    Historical Provider, MD  potassium chloride (K-DUR) 10 MEQ tablet Take 10 mEq by mouth daily.    Historical Provider, MD  rosuvastatin (CRESTOR) 20 MG tablet Take 1 tablet (20 mg total) by mouth daily. 07/06/15   Fayrene Helper, MD  terbinafine (LAMISIL) 250 MG tablet Take 1 tablet (250 mg total) by mouth daily. 07/06/15   Fayrene Helper, MD   BP 148/95 mmHg  Pulse 69  Temp(Src) 97.6 F (36.4 C) (Oral)  Resp 18  Ht 5\' 1"  (1.549 m)  Wt 137 lb (62.143 kg)  BMI 25.90 kg/m2  SpO2 99% Physical Exam  Constitutional: She appears well-developed.  HENT:  Head: Atraumatic.  Eyes: EOM are normal.  Neck: No JVD present.  Cardiovascular: Normal rate and regular rhythm.   Pulmonary/Chest: Effort normal. No respiratory distress.  Abdominal: Soft.  Musculoskeletal: She exhibits no edema.  Neurological: She is alert.  Skin: Skin is warm. No erythema.  Psychiatric: She has a normal mood and affect.    ED Course  Procedures (including critical care time) Labs Review Labs Reviewed  CBC  BASIC METABOLIC PANEL  Randolm Idol, ED    Imaging Review Dg Chest 2 View  09/27/2015  CLINICAL DATA:  Chest pain.  Difficulty swallowing. EXAM: CHEST  2 VIEW COMPARISON:  05/30/2015 FINDINGS: There is  hyperinflation of the lungs compatible with COPD. Heart and mediastinal contours are within normal limits. No focal opacities or effusions. No acute bony abnormality. IMPRESSION: COPD.  No active disease. Electronically Signed   By: Rolm Baptise M.D.   On: 09/27/2015 14:21   I have personally reviewed and evaluated these images and lab results as part of my medical decision-making.   EKG Interpretation   Date/Time:  Wednesday September 27 2015 13:54:07 EDT Ventricular Rate:  68 PR Interval:  196 QRS Duration: 80 QT Interval:  386 QTC Calculation: 410 R Axis:   54 Text Interpretation:  Normal sinus rhythm Anteroseptal infarct , age  undetermined  Abnormal ECG no change from 50 seconds earlier Confirmed by  Alvino Chapel  MD, Ovid Curd [phone]) on 09/27/2015 2:15:09 PM      MDM   Final diagnoses:  None    Patient with chest pain. Recent negative stress echo. Doubt cardiac cause. Doubt PE. Lab work x-ray and EKG reassuring. Discharge home.    Davonna Belling, MD 09/27/15 [phone]

## 2015-09-29 ENCOUNTER — Other Ambulatory Visit: Payer: Self-pay

## 2015-09-29 DIAGNOSIS — M549 Dorsalgia, unspecified: Secondary | ICD-10-CM

## 2015-09-29 MED ORDER — HYDROCODONE-ACETAMINOPHEN 10-325 MG PO TABS: ORAL_TABLET | ORAL | Status: DC

## 2015-10-10 ENCOUNTER — Ambulatory Visit: Payer: PPO | Admitting: Family Medicine

## 2015-10-10 ENCOUNTER — Ambulatory Visit (HOSPITAL_COMMUNITY)
Admission: RE | Admit: 2015-10-10 | Discharge: 2015-10-10 | Disposition: A | Discharge status: Home / Self Care | Payer: PPO | Source: Ambulatory Visit | Attending: Family Medicine | Admitting: Family Medicine

## 2015-10-10 ENCOUNTER — Other Ambulatory Visit: Payer: Self-pay | Admitting: Family Medicine

## 2015-10-10 ENCOUNTER — Telehealth: Payer: Self-pay

## 2015-10-10 DIAGNOSIS — M25571 Pain in right ankle and joints of right foot: Secondary | ICD-10-CM | POA: Diagnosis not present

## 2015-10-10 DIAGNOSIS — M79672 Pain in left foot: Secondary | ICD-10-CM

## 2015-10-10 NOTE — Telephone Encounter (Signed)
Noted and agree, will f/u x ray report

## 2015-10-10 NOTE — Assessment & Plan Note (Signed)
Uncontrolled, , needs to commit to medication for symptom control other than benzos, has resisted psych management, but it is increasingly evident that she needs this, will re address at next visit

## 2015-10-10 NOTE — Progress Notes (Signed)
Patient is aware and referral has been made to [facility]

## 2015-10-10 NOTE — Assessment & Plan Note (Signed)
Acute chest pain x 1 hour, rated at an 8, associated with symptom of light headedness and nausea as if she will [pass out, states she "has never felt this way before" Office EKG shows no ischemia , LVH or irregular heart rate , it is normal, however based on symptoms reported , pt directed to ED for further eval, MD physician aware

## 2015-10-10 NOTE — Assessment & Plan Note (Signed)
Controlled, no change in medication  

## 2015-10-11 ENCOUNTER — Telehealth: Payer: Self-pay | Admitting: Family Medicine

## 2015-10-11 NOTE — Telephone Encounter (Signed)
OPENED IN ERROR

## 2015-10-12 ENCOUNTER — Ambulatory Visit (INDEPENDENT_AMBULATORY_CARE_PROVIDER_SITE_OTHER): Payer: PPO | Admitting: Orthopedic Surgery

## 2015-10-12 VITALS — BP 124/74 | HR 77 | Ht 61.0 in | Wt 134.0 lb

## 2015-10-12 DIAGNOSIS — S90211A Contusion of right great toe with damage to nail, initial encounter: Secondary | ICD-10-CM

## 2015-10-12 NOTE — Patient Instructions (Signed)
Epsom salt soaks 20 minutes 3 times a day  Heart sole shoe for ambulation

## 2015-10-12 NOTE — Progress Notes (Signed)
Chief Complaint  Patient presents with  . new problem    Right foot box felll on it 09/5032 DOI    70 year old female was shopping at [facility] and a box fell on her right great toe. She went to her doctor she Center for x-ray x-rays show no fracture but patient complains of pain swelling and erythema of the right great toe  Date of injury as Monday, July 17  Patient complains of mild to moderate pain morning and night burning sharp pain over the great toe  She's using peroxide and soaking it also using alcohol.  Review of systems no fever  Examination.vs BP 124/74 mmHg  Pulse 77  Ht 5\' 1"  (1.549 m)  Wt 134 lb (60.782 kg)  BMI 25.33 kg/m2 Altered gait painful gait right leg oriented 3 mood pleasant toe swollen tender the nail was lifted up skin is a little erythematous dorsal pulses are normal capillary refill is excellent sensation is normal  Impression x-rays negative  Contusion right great toe  Recommend continue soaks  Heart sole shoe  Recheck 3 weeks

## 2015-10-27 ENCOUNTER — Other Ambulatory Visit: Payer: Self-pay

## 2015-10-27 DIAGNOSIS — M549 Dorsalgia, unspecified: Secondary | ICD-10-CM

## 2015-10-27 MED ORDER — HYDROCODONE-ACETAMINOPHEN 10-325 MG PO TABS: ORAL_TABLET | ORAL | 0 refills | Status: DC

## 2015-10-30 ENCOUNTER — Ambulatory Visit (INDEPENDENT_AMBULATORY_CARE_PROVIDER_SITE_OTHER): Payer: PPO | Admitting: Family Medicine

## 2015-10-30 ENCOUNTER — Encounter (INDEPENDENT_AMBULATORY_CARE_PROVIDER_SITE_OTHER): Payer: Self-pay

## 2015-10-30 ENCOUNTER — Encounter: Payer: Self-pay | Admitting: Family Medicine

## 2015-10-30 VITALS — BP 138/74 | HR 72 | Resp 16 | Ht 61.0 in | Wt 135.0 lb

## 2015-10-30 DIAGNOSIS — Z87891 Personal history of nicotine dependence: Secondary | ICD-10-CM

## 2015-10-30 DIAGNOSIS — E785 Hyperlipidemia, unspecified: Secondary | ICD-10-CM | POA: Diagnosis not present

## 2015-10-30 DIAGNOSIS — Z1211 Encounter for screening for malignant neoplasm of colon: Secondary | ICD-10-CM | POA: Diagnosis not present

## 2015-10-30 DIAGNOSIS — I10 Essential (primary) hypertension: Secondary | ICD-10-CM

## 2015-10-30 DIAGNOSIS — E1151 Type 2 diabetes mellitus with diabetic peripheral angiopathy without gangrene: Secondary | ICD-10-CM

## 2015-10-30 DIAGNOSIS — F411 Generalized anxiety disorder: Secondary | ICD-10-CM

## 2015-10-30 LAB — POC HEMOCCULT BLD/STL (OFFICE/1-CARD/DIAGNOSTIC): FECAL OCCULT BLD: NEGATIVE

## 2015-10-30 MED ORDER — AZELASTINE HCL 0.1 % NA SOLN: 2.0000 | Freq: Two times a day (BID) | NASAL | 12 refills | Status: DC

## 2015-10-30 MED ORDER — MONTELUKAST SODIUM 10 MG PO TABS: 10.0000 mg | ORAL_TABLET | Freq: Every day | ORAL | 3 refills | Status: DC

## 2015-10-30 NOTE — Patient Instructions (Addendum)
Annual wellness in 4 month, call if you need me sooner  Fasting labs this week please  Rectal exam shows no blood in stool  We will refer you for low dose chest scan due to long smoking history over 30 pack years , once you do quualify  New is singulair and astelin for allergies and cough  Thank you  for choosing [facility]. We consider it a privelige to serve you.  Delivering excellent health care in a caring and  compassionate way is our goal.  Partnering with you,  so that together we can achieve this goal is our strategy.

## 2015-11-02 ENCOUNTER — Ambulatory Visit: Payer: PPO | Admitting: Orthopedic Surgery

## 2015-11-03 ENCOUNTER — Ambulatory Visit (HOSPITAL_COMMUNITY): Payer: PPO

## 2015-11-05 NOTE — Assessment & Plan Note (Signed)
Controlled, no change in medication DASH diet and commitment to daily physical activity for a minimum of 30 minutes discussed and encouraged, as a part of hypertension management. The importance of attaining a healthy weight is also discussed.  BP/Weight 10/30/2015 10/12/2015 09/27/2015 09/27/2015 07/06/2015 123XX123 Q000111Q  Systolic BP 0000000 A999333 Q000111Q 123XX123 [id number]  Diastolic BP 74 74 85 68 68 68 66  Wt. (Lbs) 135 134 137 137 137 138 -  BMI 25.51 25.33 25.9 25.9 25.05 25.23 -

## 2015-11-05 NOTE — Assessment & Plan Note (Signed)
Hyperlipidemia:Low fat diet discussed and encouraged.   Lipid Panel  Lab Results  Component Value Date   CHOL 136 05/31/2015   HDL 41 05/31/2015   LDLCALC 42 05/31/2015   LDLDIRECT 166 (H) 10/11/2009   TRIG 263 (H) 05/31/2015   CHOLHDL 3.3 05/31/2015   Uncontrolled needs to reduce fried and fatty foods

## 2015-11-05 NOTE — Assessment & Plan Note (Signed)
Controlled, no change in medication  

## 2015-11-05 NOTE — Progress Notes (Signed)
Carla [name]     MRN: [id number]      DOB: 03/25/1946   HPI Carla [name] is here for follow up and re-evaluation of chronic medical conditions, medication management and review of any available recent lab and radiology data.  Preventive health is updated, specifically  Cancer screening and Immunization.   Questions or concerns regarding consultations or procedures which the PT has had in the interim are  addressed. The PT denies any adverse reactions to current medications since the last visit.  Has an over 30 pack year smoking history , hence needs screening , she agrees to tis, quit several years ago ROS Denies recent fever or chills. Denies sinus pressure c/o increased , nasal congestion, clear PND, denies  ear pain or sore throat. Denies chest congestion, productive cough or wheezing. Denies chest pains, palpitations and leg swelling Denies abdominal pain, nausea, vomiting,diarrhea or constipation.   Denies dysuria, frequency, hesitancy or incontinence. Denies uncontrolled  joint pain, swelling and limitation in mobility. Denies headaches, seizures, numbness, or tingling. Denies depression, uncontrolled  anxiety or insomnia. Denies skin break down or rash.   PE  BP 138/74   Pulse 72   Resp 16   Ht 5\' 1"  (1.549 m)   Wt 135 lb (61.2 kg)   SpO2 98%   BMI 25.51 kg/m   Patient alert and oriented and in no cardiopulmonary distress.  HEENT: No facial asymmetry, EOMI,   oropharynx pink and moist.  Neck supple no JVD, no mass.  Chest: Clear to auscultation bilaterally.  CVS: S1, S2 no murmurs, no S3.Regular rate.  ABD: Soft non tender. No organomegaly or mass, normal BS Rectal: no mass, heme negative stool  Ext: No edema   MS: Adequate though reduced   ROM spine, shoulders, hips and knees.  Skin: Intact, no ulcerations or rash noted.  Psych: Good eye contact, normal affect. Memory intact not anxious or depressed appearing.  CNS: CN 2-12 intact, power,  normal  throughout.no focal deficits noted.   Assessment & Plan  Essential hypertension Controlled, no change in medication DASH diet and commitment to daily physical activity for a minimum of 30 minutes discussed and encouraged, as a part of hypertension management. The importance of attaining a healthy weight is also discussed.  BP/Weight 10/30/2015 10/12/2015 09/27/2015 09/27/2015 07/06/2015 123XX123 Q000111Q  Systolic BP 0000000 A999333 Q000111Q [id number] [id number]  Diastolic BP 74 74 85 68 68 68 66  Wt. (Lbs) 135 134 137 137 137 138 -  BMI 25.51 25.33 25.9 25.9 25.05 25.23 -       Well controlled type 2 diabetes mellitus with peripheral circulatory disorder (HCC) Controlled, no change in medication Carla [name] is reminded of the importance of commitment to daily physical activity for 30 minutes or more, as able and the need to limit carbohydrate intake to 30 to 60 grams per meal to help with blood sugar control.   The need to take medication as prescribed, test blood sugar as directed, and to call between visits if there is a concern that blood sugar is uncontrolled is also discussed.   Carla [name] is reminded of the importance of daily foot exam, annual eye examination, and good blood sugar, blood pressure and cholesterol control.  Diabetic Labs Latest Ref Rng & Units 09/27/2015 07/11/2015 05/31/2015 05/30/2015 05/01/2015  HbA1c 4.8 - 5.6 % - - 6.5(H) - -  Microalbumin Not estab mg/dL - 25.9 - - -  Micro/Creat Ratio <30 mcg/mg creat - 48(H) - - -  Chol 0 - 200 mg/dL - - 136 - 159  HDL >40 mg/dL - - 41 - 55  Calc LDL 0 - 99 mg/dL - - 42 - 64  Triglycerides <150 mg/dL - - 263(H) - 200(H)  Creatinine 0.44 - 1.00 mg/dL 0.96 - 0.91 1.09(H) 0.87   BP/Weight 10/30/2015 10/12/2015 09/27/2015 09/27/2015 07/06/2015 123XX123 Q000111Q  Systolic BP 0000000 A999333 Q000111Q 123XX123 [id number]  Diastolic BP 74 74 85 68 68 68 66  Wt. (Lbs) 135 134 137 137 137 138 -  BMI 25.51 25.33 25.9 25.9 25.05 25.23 -   Foot/eye exam completion dates  Latest Ref Rng & Units 02/02/2015 04/28/2014  Eye Exam No Retinopathy - No Retinopathy  Foot Form Completion - Done -        GAD (generalized anxiety disorder) Controlled, no change in medication   Hyperlipidemia LDL goal <100 Hyperlipidemia:Low fat diet discussed and encouraged.   Lipid Panel  Lab Results  Component Value Date   CHOL 136 05/31/2015   HDL 41 05/31/2015   LDLCALC 42 05/31/2015   LDLDIRECT 166 (H) 10/11/2009   TRIG 263 (H) 05/31/2015   CHOLHDL 3.3 05/31/2015   Uncontrolled needs to reduce fried and fatty foods

## 2015-11-05 NOTE — Assessment & Plan Note (Signed)
Controlled, no change in medication Carla [name] is reminded of the importance of commitment to daily physical activity for 30 minutes or more, as able and the need to limit carbohydrate intake to 30 to 60 grams per meal to help with blood sugar control.   The need to take medication as prescribed, test blood sugar as directed, and to call between visits if there is a concern that blood sugar is uncontrolled is also discussed.   Carla [name] is reminded of the importance of daily foot exam, annual eye examination, and good blood sugar, blood pressure and cholesterol control.  Diabetic Labs Latest Ref Rng & Units 09/27/2015 07/11/2015 05/31/2015 05/30/2015 05/01/2015  HbA1c 4.8 - 5.6 % - - 6.5(H) - -  Microalbumin Not estab mg/dL - 25.9 - - -  Micro/Creat Ratio <30 mcg/mg creat - 48(H) - - -  Chol 0 - 200 mg/dL - - 136 - 159  HDL >40 mg/dL - - 41 - 55  Calc LDL 0 - 99 mg/dL - - 42 - 64  Triglycerides <150 mg/dL - - 263(H) - 200(H)  Creatinine 0.44 - 1.00 mg/dL 0.96 - 0.91 1.09(H) 0.87   BP/Weight 10/30/2015 10/12/2015 09/27/2015 09/27/2015 07/06/2015 123XX123 Q000111Q  Systolic BP 0000000 A999333 Q000111Q 123XX123 [id number]  Diastolic BP 74 74 85 68 68 68 66  Wt. (Lbs) 135 134 137 137 137 138 -  BMI 25.51 25.33 25.9 25.9 25.05 25.23 -   Foot/eye exam completion dates Latest Ref Rng & Units 02/02/2015 04/28/2014  Eye Exam No Retinopathy - No Retinopathy  Foot Form Completion - Done -

## 2015-11-08 ENCOUNTER — Telehealth: Payer: Self-pay

## 2015-11-08 NOTE — Telephone Encounter (Signed)
pls see if  geen eric lipitor 40 mg is more asffordable and send in place of crestor, if not pls let me know which are the lower cosr statins she can try

## 2015-11-08 NOTE — Telephone Encounter (Signed)
pls see message sent re liptor ionstead of crestor

## 2015-11-09 MED ORDER — ATORVASTATIN CALCIUM 40 MG PO TABS: 40.0000 mg | ORAL_TABLET | Freq: Every day | ORAL | 3 refills | Status: DC

## 2015-11-09 NOTE — Telephone Encounter (Signed)
Sent change in medication to pharmacy.

## 2015-11-09 NOTE — Telephone Encounter (Signed)
Patient made aware via voicemail.

## 2015-11-16 ENCOUNTER — Ambulatory Visit (HOSPITAL_COMMUNITY): Admission: RE | Admit: 2015-11-16 | Payer: PPO | Source: Ambulatory Visit

## 2015-11-24 ENCOUNTER — Other Ambulatory Visit: Payer: Self-pay

## 2015-11-24 DIAGNOSIS — M549 Dorsalgia, unspecified: Secondary | ICD-10-CM

## 2015-11-24 MED ORDER — HYDROCODONE-ACETAMINOPHEN 10-325 MG PO TABS
ORAL_TABLET | ORAL | 0 refills | Status: DC
Start: 2015-11-24 — End: 2015-12-13

## 2015-12-01 ENCOUNTER — Other Ambulatory Visit: Payer: Self-pay | Admitting: Family Medicine

## 2015-12-05 ENCOUNTER — Ambulatory Visit (HOSPITAL_COMMUNITY): Payer: PPO | Attending: Family Medicine

## 2015-12-13 ENCOUNTER — Other Ambulatory Visit: Payer: Self-pay

## 2015-12-13 DIAGNOSIS — M549 Dorsalgia, unspecified: Secondary | ICD-10-CM

## 2015-12-13 MED ORDER — HYDROCODONE-ACETAMINOPHEN 10-325 MG PO TABS: ORAL_TABLET | ORAL | 0 refills | Status: DC

## 2015-12-20 ENCOUNTER — Other Ambulatory Visit: Payer: Self-pay | Admitting: Family Medicine

## 2016-01-26 ENCOUNTER — Other Ambulatory Visit: Payer: Self-pay

## 2016-01-26 DIAGNOSIS — M549 Dorsalgia, unspecified: Secondary | ICD-10-CM

## 2016-01-26 MED ORDER — HYDROCODONE-ACETAMINOPHEN 10-325 MG PO TABS: ORAL_TABLET | ORAL | 0 refills | Status: DC

## 2016-02-01 ENCOUNTER — Other Ambulatory Visit: Payer: Self-pay | Admitting: Family Medicine

## 2016-02-02 ENCOUNTER — Other Ambulatory Visit: Payer: Self-pay | Admitting: Family Medicine

## 2016-02-22 ENCOUNTER — Other Ambulatory Visit: Payer: Self-pay

## 2016-02-22 DIAGNOSIS — M549 Dorsalgia, unspecified: Secondary | ICD-10-CM

## 2016-02-22 MED ORDER — HYDROCODONE-ACETAMINOPHEN 10-325 MG PO TABS: ORAL_TABLET | ORAL | 0 refills | Status: DC

## 2016-02-29 ENCOUNTER — Encounter: Payer: PPO | Admitting: Family Medicine

## 2016-02-29 ENCOUNTER — Ambulatory Visit (INDEPENDENT_AMBULATORY_CARE_PROVIDER_SITE_OTHER): Payer: PPO

## 2016-02-29 VITALS — BP 136/74 | HR 86 | Resp 18 | Ht 61.0 in | Wt 135.1 lb

## 2016-02-29 DIAGNOSIS — Z0000 Encounter for general adult medical examination without abnormal findings: Secondary | ICD-10-CM | POA: Diagnosis not present

## 2016-02-29 DIAGNOSIS — Z79899 Other long term (current) drug therapy: Secondary | ICD-10-CM | POA: Diagnosis not present

## 2016-02-29 DIAGNOSIS — N3000 Acute cystitis without hematuria: Secondary | ICD-10-CM | POA: Diagnosis not present

## 2016-02-29 LAB — POCT URINALYSIS DIPSTICK
Glucose, UA: NEGATIVE
Ketones, UA: 15
Leukocytes, UA: NEGATIVE
NITRITE UA: NEGATIVE
RBC UA: NEGATIVE
SPEC GRAV UA: 1.025
UROBILINOGEN UA: 0.2
pH, UA: 5.5

## 2016-02-29 MED ORDER — ALPRAZOLAM 0.5 MG PO TABS: 0.5000 mg | ORAL_TABLET | Freq: Two times a day (BID) | ORAL | 2 refills | Status: DC

## 2016-02-29 MED ORDER — ACYCLOVIR 800 MG PO TABS: ORAL_TABLET | ORAL | 2 refills | Status: DC

## 2016-02-29 NOTE — Progress Notes (Signed)
Subjective:   Carla [name] is a 70 y.o. female who presents for Medicare Annual (Subsequent) preventive examination.  Review of Systems:   Cardiac Risk Factors include: advanced age (>38men, >57 women);smoking/ tobacco exposure;dyslipidemia;diabetes mellitus;hypertension     Objective:     Vitals: BP 136/74   Pulse 86   Resp 18   Ht 5\' 1"  (1.549 m)   Wt 135 lb 1.9 oz (61.3 kg)   SpO2 97%   BMI 25.53 kg/m   Body mass index is 25.53 kg/m.   Tobacco History  Smoking Status  . Former Smoker  . Packs/day: 0.50  . Types: Cigarettes  Smokeless Tobacco  . Former Systems developer  . Quit date: 05/12/2012     Counseling given: Not Answered   Past Medical History:  Diagnosis Date  . ASCVD (arteriosclerotic cardiovascular disease)    No critical disease on cath in 8/97: mild AI with trival, if any l AS; negative stress nuclear in 2010  . Back pain   . Cervical disc herniation   . Chronic bronchitis   . Colon polyps   . Depression   . Elevated hemoglobin A1c   . Fasting hyperglycemia   . GERD (gastroesophageal reflux disease)   . Hydronephrosis   . Hypertension   . Osteoporosis   . Peripheral vascular disease ([place])    stauts post aortabifemoral graft    . Secondary erythrocytosis 08/05/2012   Secondary to COPD  . Tobacco abuse    has stopped   Past Surgical History:  Procedure Laterality Date  . ABDOMINAL HYSTERECTOMY    . aorta bifem graft    . CHOLECYSTECTOMY    . COLONOSCOPY  06/23/2006   Dr. Fields:Normal colon without evidence of polyps, masses, inflammatory changes/Normal retroflex view of the rectum  . COLONOSCOPY N/A 08/24/2013   Procedure: COLONOSCOPY;  Surgeon: Danie Binder, MD;  [facility];  Service: Endoscopy;  Laterality: N/A;  10:30-moved to [place] notified pt  . lysis of adhesions  2000  . PARTIAL HYSTERECTOMY    . right kidney surgery following damage during arterial surgery    . TOTAL ABDOMINAL HYSTERECTOMY W/ BILATERAL  SALPINGOOPHORECTOMY  1975   Family History  Problem Relation Age of Onset  . Heart attack Father   . COPD Sister   . Heart disease Sister   . Diabetes Sister   . Coronary artery disease Sister   . Cancer Sister 25    liver  . Diabetes Sister   . Diabetes Sister   . COPD Sister   . Colon cancer Neg Hx    History  Sexual Activity  . Sexual activity: No    Outpatient Encounter Prescriptions as of 02/29/2016  Medication Sig  . acyclovir (ZOVIRAX) 800 MG tablet TAKE ONE TABLET BY MOUTH 2 TIMES DAILY.  Marland Kitchen alendronate (FOSAMAX) 70 MG tablet TAKE 1 TABLET EACH WEEK 30 MIN BEFORE BREAKFAST WITH 8 OUNCES OF WATER FOR OSTEOPOROSIS.  Marland Kitchen ALPRAZolam (XANAX) 0.5 MG tablet Take 1 tablet (0.5 mg total) by mouth 2 (two) times daily.  Marland Kitchen amLODipine (NORVASC) 10 MG tablet TAKE (1) TABLET BY MOUTH DAILY.  Marland Kitchen aspirin EC 81 MG tablet Take 81 mg by mouth daily.  Marland Kitchen atorvastatin (LIPITOR) 40 MG tablet Take 1 tablet (40 mg total) by mouth daily.  Marland Kitchen azelastine (ASTELIN) 0.1 % nasal spray Place 2 sprays into both nostrils 2 (two) times daily. Use in each nostril as directed  . Cholecalciferol (VITAMIN D) 2000 UNITS CAPS Take  1 capsule by mouth daily.  . cyclobenzaprine (FLEXERIL) 10 MG tablet TAKE ONE TABLET BY MOUTH 3 TIMES DAILY AS NEEDED FOR MUSCLE SPASM(S).  Marland Kitchen gabapentin (NEURONTIN) 100 MG capsule Take 2 capsules (200 mg total) by mouth at bedtime. (Patient taking differently: Take 100 mg by mouth at bedtime. )  . Ginkgo Biloba (GINKOBA PO) Take 2 tablets by mouth at bedtime.  Marland Kitchen HYDROcodone-acetaminophen (NORCO) 10-325 MG tablet One tablet twice daily  . hydrocortisone (ANUSOL-HC) 2.5 % rectal cream APPLY SPARINGLY TO VAGINAL AREA TWICE WEEKLY AS NEEDED FOR ITCHING.  . montelukast (SINGULAIR) 10 MG tablet Take 1 tablet (10 mg total) by mouth at bedtime.  . Multiple Vitamin (MULTIVITAMIN WITH MINERALS) TABS Take 1 tablet by mouth every evening.   . Omega-3 Fatty Acids (FISH OIL PO) Take 1 capsule by mouth  daily.  . [DISCONTINUED] acyclovir (ZOVIRAX) 800 MG tablet TAKE ONE TABLET BY MOUTH 2 TIMES DAILY.  . [DISCONTINUED] ALPRAZolam (XANAX) 0.5 MG tablet TAKE ONE TABLET BY MOUTH TWICE DAILY.  . [DISCONTINUED] terbinafine (LAMISIL) 250 MG tablet Take 1 tablet (250 mg total) by mouth daily.   No facility-administered encounter medications on file as of 02/29/2016.     Activities of Daily Living In your present state of health, do you have any difficulty performing the following activities: 02/29/2016 05/30/2015  Hearing? N N  Vision? N Y  Difficulty concentrating or making decisions? Y N  Walking or climbing stairs? N N  Dressing or bathing? N N  Doing errands, shopping? N N  Preparing Food and eating ? N -  Using the Toilet? N -  In the past six months, have you accidently leaked urine? N -  Do you have problems with loss of bowel control? N -  Managing your Medications? N -  Managing your Finances? N -  Housekeeping or managing your Housekeeping? N -  Some recent data might be hidden    Patient Care Team: Fayrene Helper, MD as PCP - [place], MD as Consulting Physician (Gastroenterology)    Assessment:     Exercise Activities and Dietary recommendations Current Exercise Habits: The patient does not participate in regular exercise at present  Goals    None     Fall Risk Fall Risk  02/29/2016 10/30/2015 09/27/2015 06/15/2014 04/20/2014  Falls in the past year? No No No No No  Number falls in past yr: - - - - -  Injury with Fall? - - - - -   Depression Screen PHQ 2/9 Scores 02/29/2016 10/30/2015 03/30/2013  PHQ - 2 Score 0 3 4  PHQ- 9 Score - 12 10     Cognitive Function MMSE - Mini Mental State Exam 10/30/2015 07/06/2015  Orientation to time 4 5  Orientation to Place 5 5  Registration 3 3  Attention/ Calculation 5 5  Recall 0 0  Language- name 2 objects 2 2  Language- repeat 1 1  Language- follow 3 step command 3 3  Language- read & follow direction 1 1  Write  a sentence 1 1  Copy design 1 0  Total score 26 26        Immunization History  Administered Date(s) Administered  . Influenza Split 03/11/2011, 01/23/2012  . Influenza Whole 01/23/2007, 12/28/2008, 01/17/2010  . Influenza,inj,Quad PF,36+ Mos 01/05/2013, 02/10/2014, 12/15/2014  . Pneumococcal Conjugate-13 04/20/2014  . Pneumococcal Polysaccharide-23 11/19/2010  . Td 04/05/2003  . Tdap 12/22/2013  . Zoster 11/19/2010   Screening Tests Health Maintenance  Topic Date Due  . OPHTHALMOLOGY EXAM  04/29/2015  . HEMOGLOBIN A1C  12/01/2015  . FOOT EXAM  02/12/2016  . INFLUENZA VACCINE  06/22/2016 (Originally 10/24/2015)  . URINE MICROALBUMIN  07/10/2016  . MAMMOGRAM  07/06/2017  . COLONOSCOPY  08/25/2023  . TETANUS/TDAP  12/23/2023  . DEXA SCAN  Completed  . ZOSTAVAX  Completed  . Hepatitis C Screening  Completed  . PNA vac Low Risk Adult  Completed      Plan:   Patient will work to get recommended eye exam some barriers due to cost.  During the course of the visit the patient was educated and counseled about the following appropriate screening and preventive services:   Vaccines to include Pneumoccal, Influenza, Hepatitis B, Td, Zostavax, HCV  Electrocardiogram  Cardiovascular Disease  Colorectal cancer screening  Bone density screening  Diabetes screening  Glaucoma screening  Mammography/PAP  Nutrition counseling   Patient Instructions (the written plan) was given to the patient.   Denman George [place], [place]  [phone]

## 2016-02-29 NOTE — Patient Instructions (Signed)
Thank you for choosing [facility] for your health care needs  The Annual Wellness Visit is designed to allow [place] the chance to assist you in preserving and improving you health.   Dr. Moshe Cipro will see you back in January or February (your appointment is attached)  Your lab order will be mailed to you to have done  I will be in touch with you in regards to your urine check

## 2016-03-01 ENCOUNTER — Other Ambulatory Visit (HOSPITAL_COMMUNITY)
Admission: RE | Admit: 2016-03-01 | Discharge: 2016-03-01 | Disposition: A | Discharge status: Home / Self Care | Payer: PPO | Source: Other Acute Inpatient Hospital | Attending: Family Medicine | Admitting: Family Medicine

## 2016-03-01 DIAGNOSIS — Z79899 Other long term (current) drug therapy: Secondary | ICD-10-CM | POA: Insufficient documentation

## 2016-03-07 ENCOUNTER — Other Ambulatory Visit: Payer: Self-pay | Admitting: Family Medicine

## 2016-03-08 LAB — MISC LABCORP TEST (SEND OUT): Labcorp test code: 738526

## 2016-03-16 ENCOUNTER — Other Ambulatory Visit: Payer: Self-pay | Admitting: Family Medicine

## 2016-03-22 ENCOUNTER — Other Ambulatory Visit: Payer: Self-pay

## 2016-03-22 DIAGNOSIS — M549 Dorsalgia, unspecified: Secondary | ICD-10-CM

## 2016-03-22 MED ORDER — HYDROCODONE-ACETAMINOPHEN 10-325 MG PO TABS: ORAL_TABLET | ORAL | 0 refills | Status: DC

## 2016-04-01 ENCOUNTER — Ambulatory Visit (INDEPENDENT_AMBULATORY_CARE_PROVIDER_SITE_OTHER): Payer: PPO | Admitting: Family Medicine

## 2016-04-01 ENCOUNTER — Encounter: Payer: Self-pay | Admitting: Family Medicine

## 2016-04-01 ENCOUNTER — Other Ambulatory Visit: Payer: Self-pay | Admitting: Family Medicine

## 2016-04-01 VITALS — BP 126/70 | HR 80 | Resp 18 | Ht 61.0 in | Wt 134.1 lb

## 2016-04-01 DIAGNOSIS — B009 Herpesviral infection, unspecified: Secondary | ICD-10-CM

## 2016-04-01 DIAGNOSIS — F3289 Other specified depressive episodes: Secondary | ICD-10-CM

## 2016-04-01 DIAGNOSIS — G8929 Other chronic pain: Secondary | ICD-10-CM

## 2016-04-01 DIAGNOSIS — M549 Dorsalgia, unspecified: Secondary | ICD-10-CM

## 2016-04-01 DIAGNOSIS — M545 Low back pain, unspecified: Secondary | ICD-10-CM

## 2016-04-01 DIAGNOSIS — Z113 Encounter for screening for infections with a predominantly sexual mode of transmission: Secondary | ICD-10-CM | POA: Diagnosis not present

## 2016-04-01 DIAGNOSIS — E785 Hyperlipidemia, unspecified: Secondary | ICD-10-CM

## 2016-04-01 MED ORDER — HYDROCORTISONE 2.5 % RE CREA: TOPICAL_CREAM | RECTAL | 0 refills | Status: DC

## 2016-04-01 MED ORDER — FLUOXETINE HCL 10 MG PO TABS: 10.0000 mg | ORAL_TABLET | Freq: Every day | ORAL | 5 refills | Status: DC

## 2016-04-01 MED ORDER — HYDROCODONE-ACETAMINOPHEN 10-325 MG PO TABS: ORAL_TABLET | ORAL | 0 refills | Status: DC

## 2016-04-01 NOTE — Patient Instructions (Signed)
F/u in 3 month, call if you need me before  X ray of low back this week.  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 We consider it a privelige to serve you.  Delivering excellent health care in a caring and  compassionate way is our goal.  Partnering with you,  so that together we can achieve this goal is our strategy.

## 2016-04-02 LAB — HSV 2 ANTIBODY, IGG: HSV 2 Glycoprotein G Ab, IgG: 3.62 Index — ABNORMAL HIGH (ref <0.90)

## 2016-04-05 ENCOUNTER — Encounter: Payer: Self-pay | Admitting: Family Medicine

## 2016-04-05 NOTE — Assessment & Plan Note (Signed)
Hyperlipidemia:Low fat diet discussed and encouraged.   Lipid Panel  Lab Results  Component Value Date   CHOL 136 05/31/2015   HDL 41 05/31/2015   LDLCALC 42 05/31/2015   LDLDIRECT 166 (H) 10/11/2009   TRIG 263 (H) 05/31/2015   CHOLHDL 3.3 05/31/2015     Updated lab needed at/ before next visit.

## 2016-04-05 NOTE — Assessment & Plan Note (Signed)
Pain management policy reviewed including the fact that 3 month supplies of medication are being directly handed to patient. Despite the fact that she c/o increased [ppain, non compliant with gabapentin anSSRI which shill benefit from both, will start those medications Pain contract signed

## 2016-04-05 NOTE — Assessment & Plan Note (Signed)
Reports still feeling very stressed about this, and requests re test despite lack of current flare, also agrees finally to see a therapist, which she has needed for a long time

## 2016-04-05 NOTE — Progress Notes (Signed)
   Carla [name]     MRN: [id number]      DOB: 09/24/1945   HPI  in particular chronic pain management and new policy which is being implemented.  medication managemeQuestions or concerns regarding consultations or procedures which the PT has had in the interim are  addressed. The PT denies any advreports chronic anxiety, wants re testing for HSV 2  C/o feeling depressed all the time,  Not suicidal or homicidal    ROS Denies recent fever or chills. Denies sinus pressure, nasal congestion, ear pain or sore throat. Denies chest congestion, productive cough or wheezing. Denies chest pains, palpitations and leg swelling Denies abdominal pain, nausea, vomiting,diarrhea or constipation.   Denies dysuria, frequency, hesitancy or incontinence.  Denies skin break down or rash.   PE  BP 126/70   Pulse 80   Resp 18   Ht 5\' 1"  (1.549 m)   Wt 134 lb 1.9 oz (60.8 kg)   SpO2 92%   BMI 25.34 kg/m   Patient alert and oriented and in no cardiopulmonary distress.  HEENT: No facial asymmetry, EOMI,   oropharynx pink and moist.  Neck supple no JVD, no mass.  Chest: Clear to auscultation bilaterally.  CVS: S1, S2 no murmurs, no S3.Regular rate.  ABD: Soft non tender.   Ext: No edema  MS: deceased though Adequate ROM spine,  Normal in shoulders, hips and knees.  Skin: Intact, no ulcerations or rash noted.  Psych: Good eye contact, flat  affect. Memory intact mildly  anxious , tearful and  depressed appearing.  CNS: CN 2-12 intact, power,  normal throughout.no focal deficits noted.   Assessment & Plan  Encounter for chronic pain management Pain management policy reviewed including the fact that 3 month supplies of medication are being directly handed to patient. Despite the fact that she c/o increased [ppain, non compliant with gabapentin anSSRI which shill benefit from both, will start those medications Pain contract signed  Western blot positive HSV2 Reports still feeling  very stressed about this, and requests re test despite lack of current flare, also agrees finally to see a therapist, which she has needed for a long time    Hyperlipidemia LDL goal <100 Hyperlipidemia:Low fat diet discussed and encouraged.   Lipid Panel  Lab Results  Component Value Date   CHOL 136 05/31/2015   HDL 41 05/31/2015   LDLCALC 42 05/31/2015   LDLDIRECT 166 (H) 10/11/2009   TRIG 263 (H) 05/31/2015   CHOLHDL 3.3 05/31/2015     Updated lab needed at/ before next visit.   Back pain with radiation Chronic and reportedly worsening pain, pt to employ alternate more appropriate modalities for chronic pain management

## 2016-04-05 NOTE — Assessment & Plan Note (Signed)
Chronic and reportedly worsening pain, pt to employ alternate more appropriate modalities for chronic pain management

## 2016-04-09 ENCOUNTER — Telehealth: Payer: Self-pay

## 2016-04-09 ENCOUNTER — Other Ambulatory Visit: Payer: Self-pay | Admitting: Family Medicine

## 2016-04-09 DIAGNOSIS — N949 Unspecified condition associated with female genital organs and menstrual cycle: Secondary | ICD-10-CM

## 2016-04-09 DIAGNOSIS — N760 Acute vaginitis: Secondary | ICD-10-CM

## 2016-04-09 MED ORDER — ACYCLOVIR 400 MG PO TABS: 400.0000 mg | ORAL_TABLET | Freq: Three times a day (TID) | ORAL | 0 refills | Status: DC

## 2016-04-09 NOTE — Telephone Encounter (Signed)
Noted  

## 2016-04-09 NOTE — Telephone Encounter (Signed)
Needs gyne eval and I have referred her. Will also send in treatment for acute flare

## 2016-04-12 IMAGING — CR DG CERVICAL SPINE COMPLETE 4+V
6 series · 6 of 6 positions shown · non-contrast
Comparison: MRI cervical spine 11/09/2012.

CLINICAL DATA: Left shoulder pain.  Neck pain.

EXAM:
CERVICAL SPINE  4+ VIEWS

[view not recorded (1 of 6)]
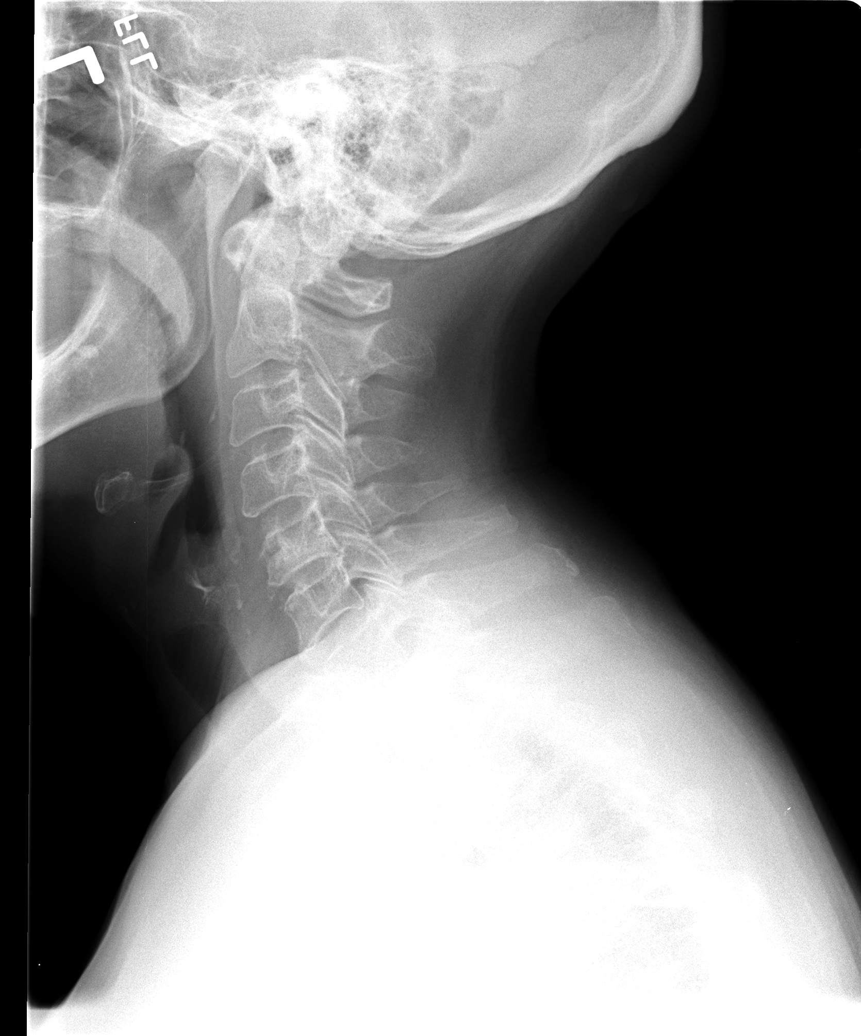

[view not recorded (2 of 6)]
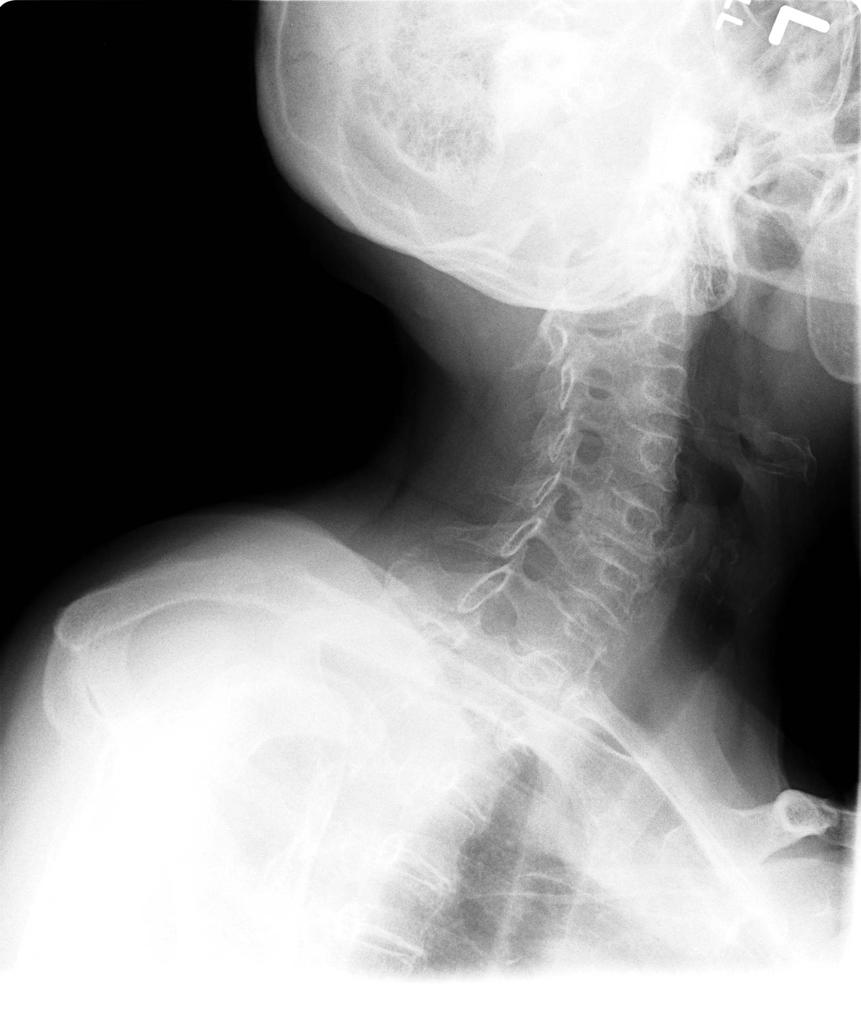

[view not recorded (3 of 6)]
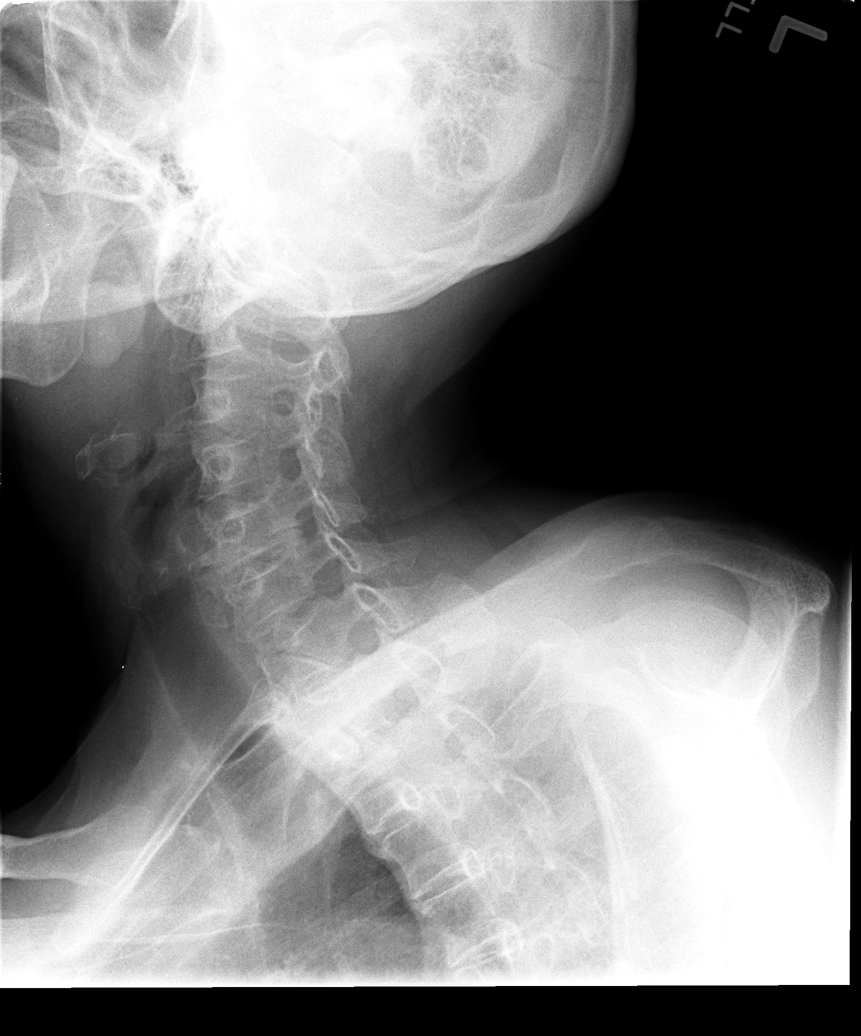

[view not recorded (4 of 6)]
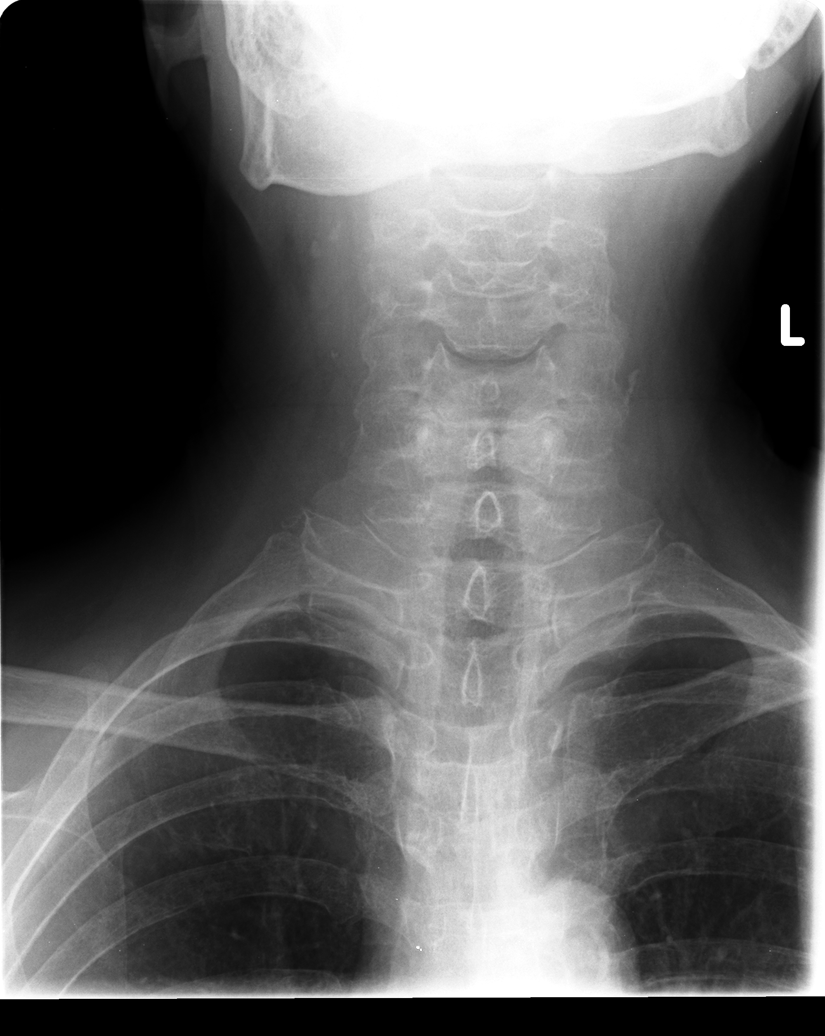

[view not recorded (5 of 6)]
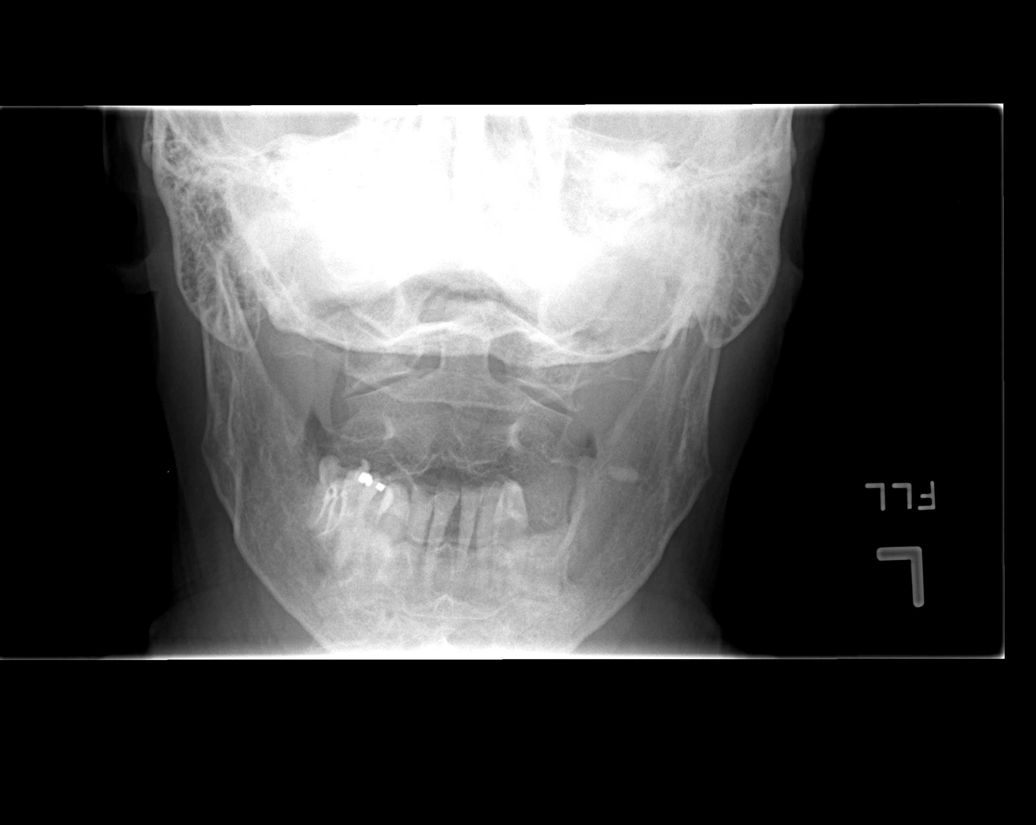

[view not recorded (6 of 6)]
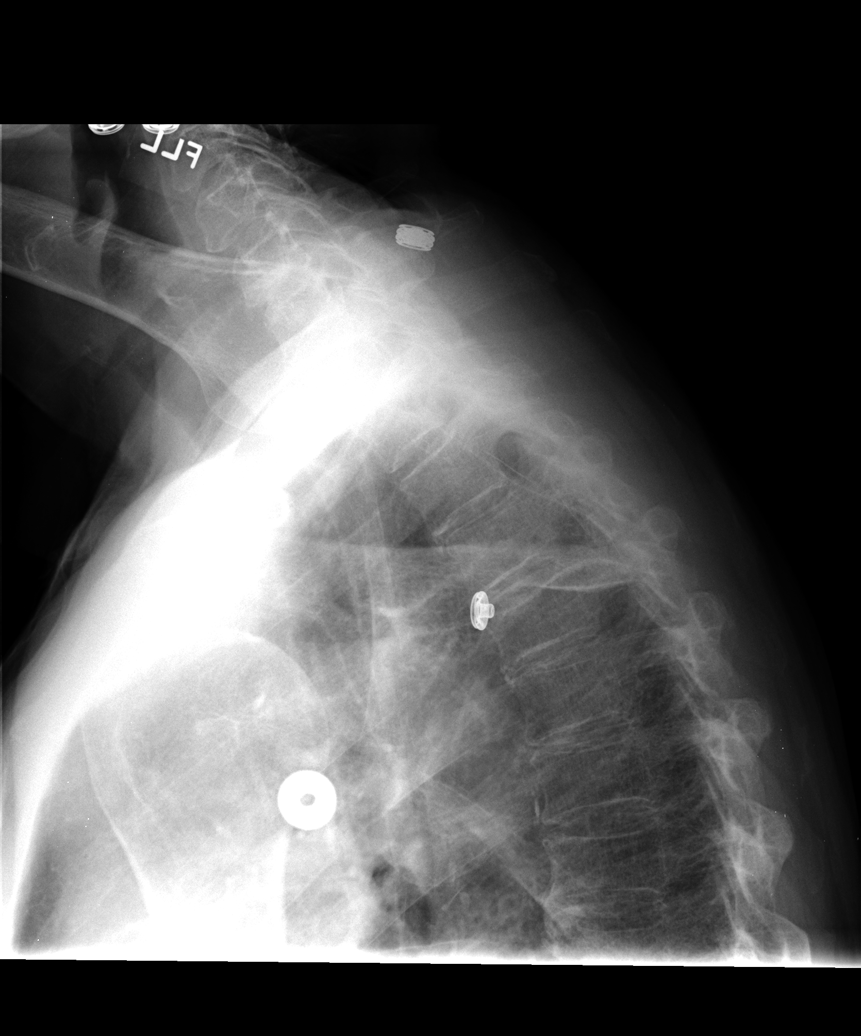

[6 of 6 positions shown; findings below may reference images not displayed]

FINDINGS: Cervical spine is visualized and skullbase through C7. The
cervicothoracic junction is visualized on the swimmer's view an is
grossly intact. Bone detail is limited.

Vertebral body heights and alignment are maintained. The
prevertebral soft tissues are within normal limits. Endplate changes
are again seen at C5-6. Bilateral uncovertebral and foraminal
narrowing is present at C5-6 is well. This is compatible with the
patient's known large disc osteophyte complex and uncovertebral
spurring.

The lung apices are clear.
IMPRESSION: 1. Moderate spondylosis is again seen at C5-6.
2. No acute abnormality.

## 2016-04-12 IMAGING — CR DG SHOULDER 2+V*L*
3 series · 3 of 3 positions shown · non-contrast
Comparison: None.

CLINICAL DATA: Left shoulder pain from the neck down. No known
injury.

EXAM:
LEFT SHOULDER - 2+ VIEW

[view not recorded (1 of 3)]
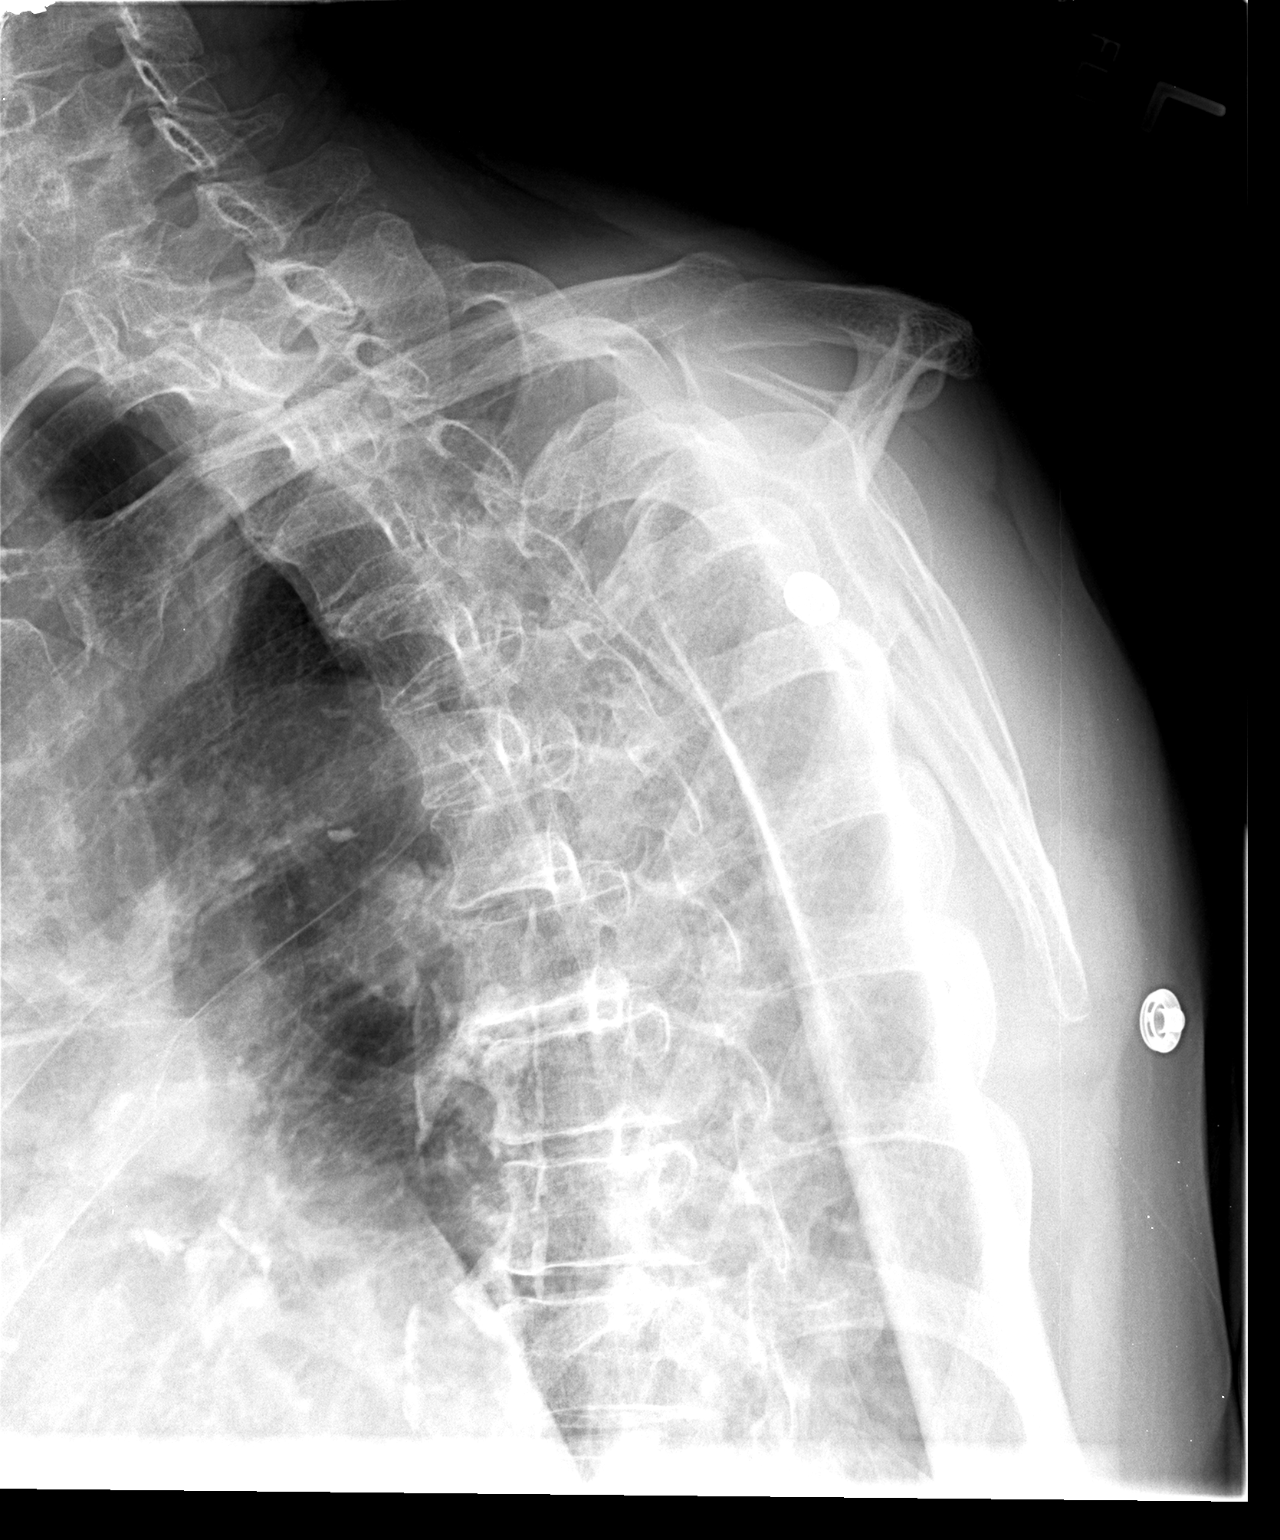

[view not recorded (2 of 3)]
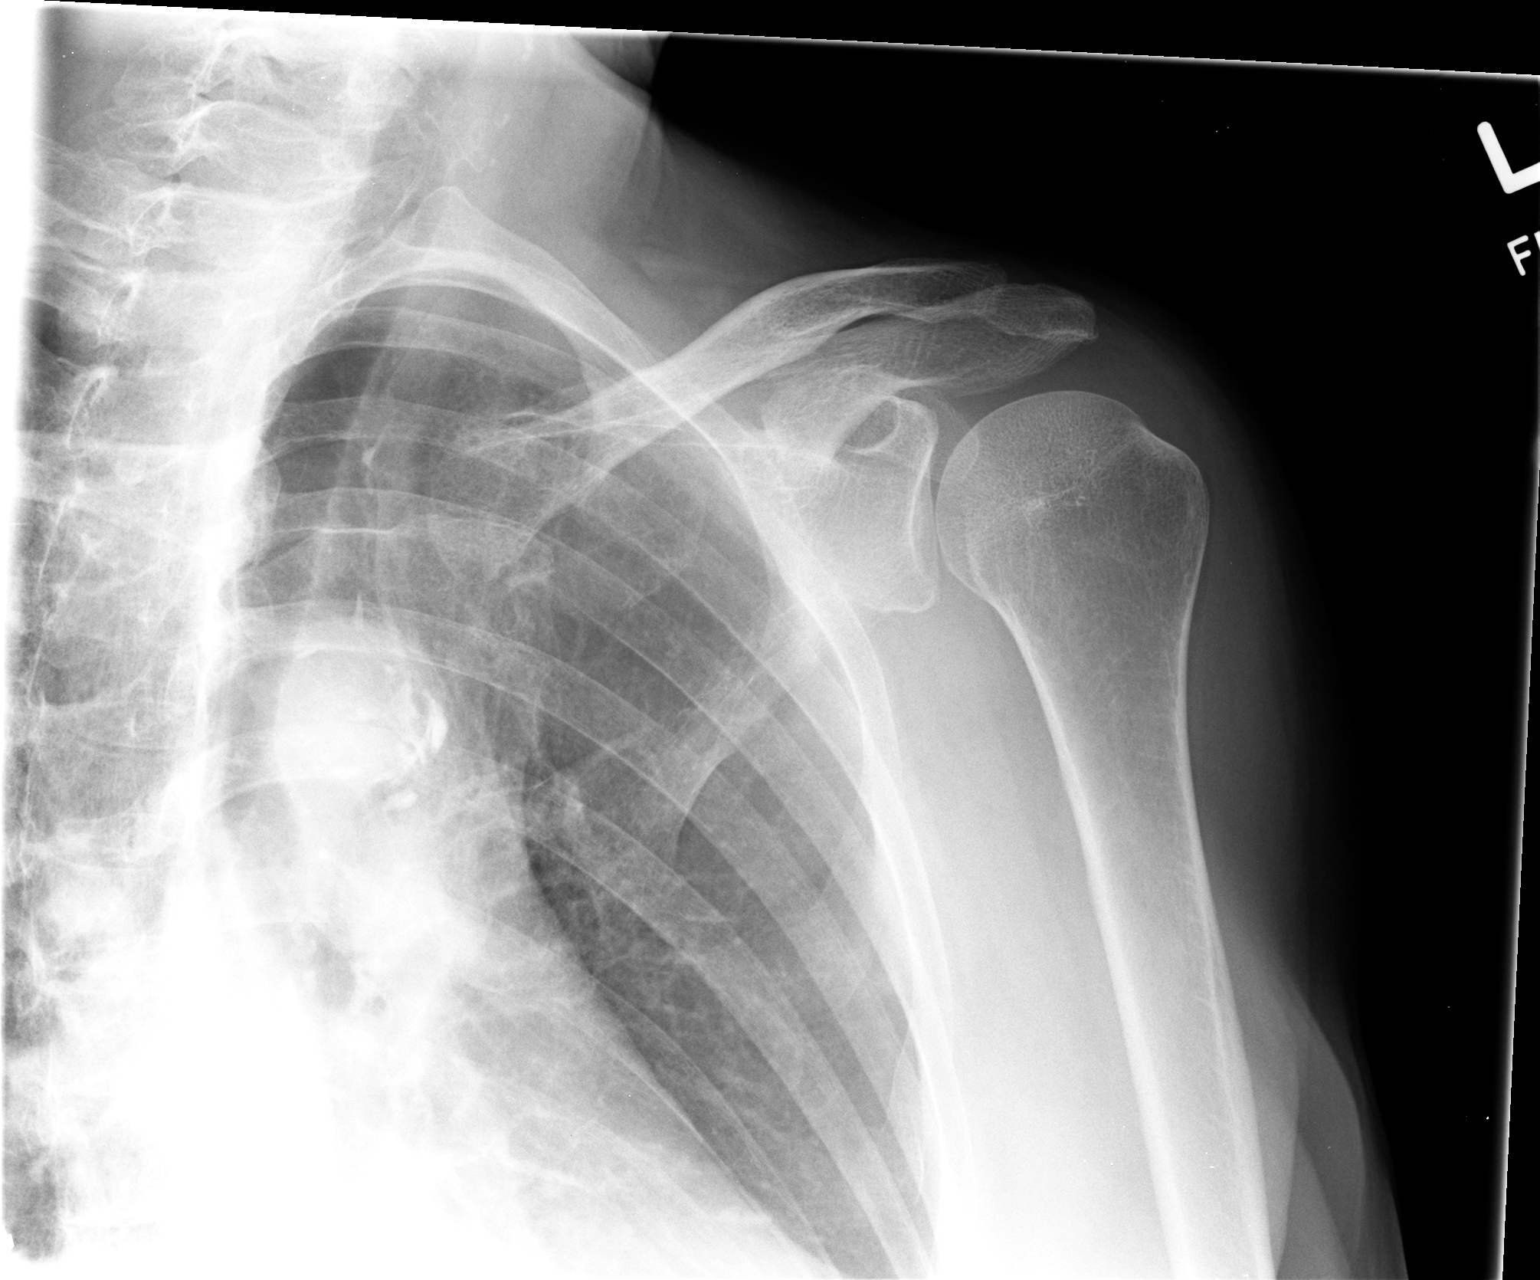

[view not recorded (3 of 3)]
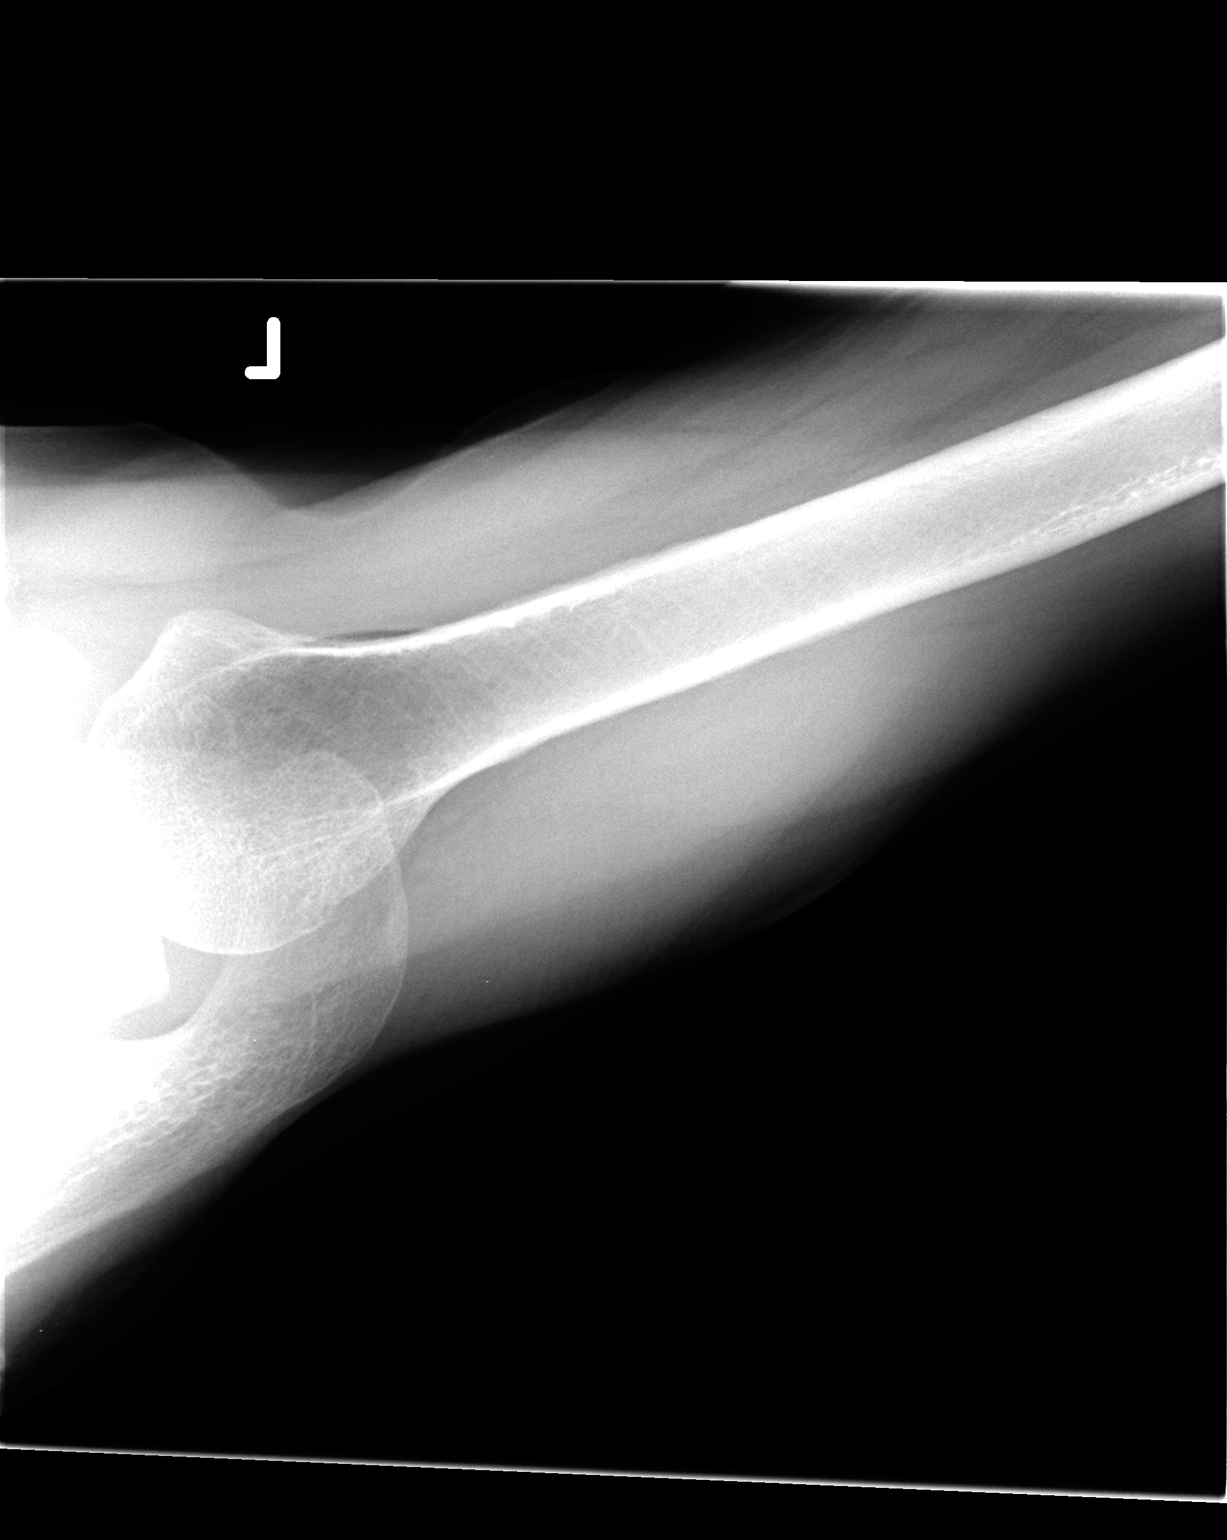

[3 of 3 positions shown; findings below may reference images not displayed]

FINDINGS: There is no evidence of fracture or dislocation. There is no
evidence of arthropathy or other focal bone abnormality. Soft
tissues are unremarkable.
IMPRESSION: Negative.

## 2016-04-12 IMAGING — MR MR CERVICAL SPINE W/O CM
4 of 6 series · 15 of 48 positions shown · non-contrast
Comparison: Cervical MRI 11/09/2012

CLINICAL DATA: Left neck pain.  Left shoulder pain

EXAM:
MRI CERVICAL SPINE WITHOUT CONTRAST
TECHNIQUE: Multiplanar, multisequence MR imaging was performed. No intravenous
contrast was administered.

[Series 3: T2 · sagittal · 3.0mm · 0.38mm/px · 4 of 13 slices shown (1 of 2)]
[im 1/13]
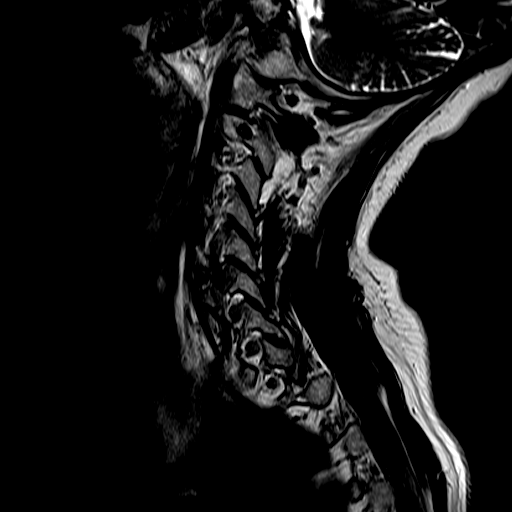
[im 5/13]
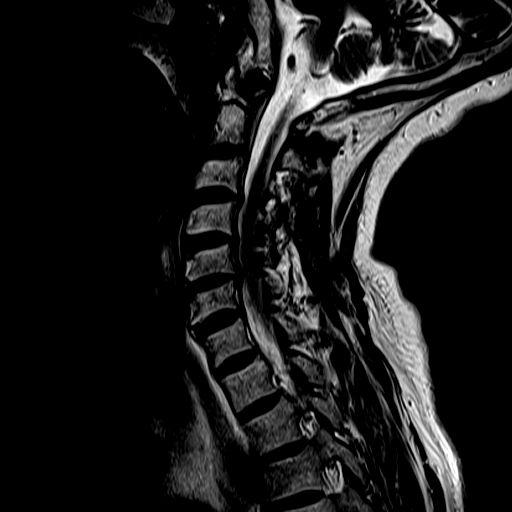
[im 9/13]
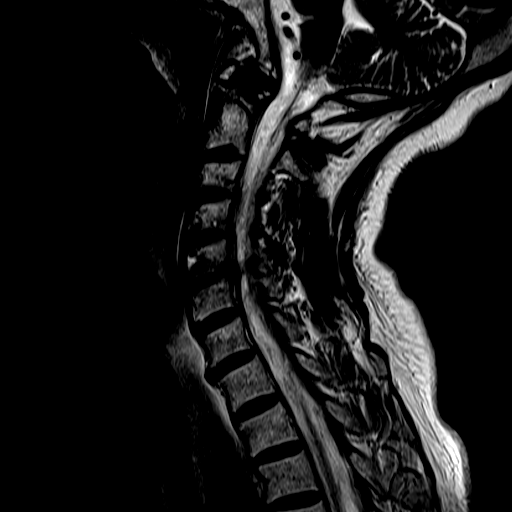
[im 13/13]
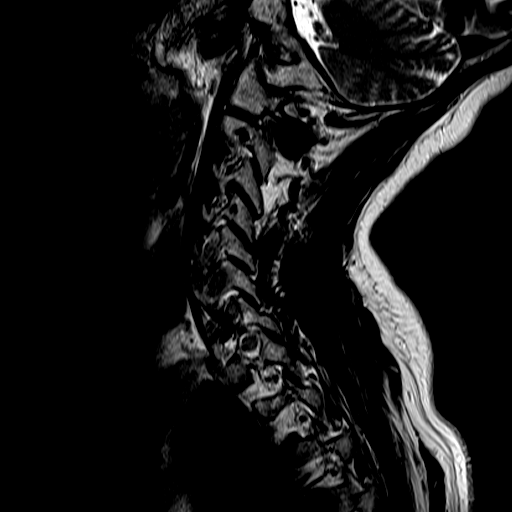

[Series 4: FLAIR · sagittal · 3.0mm · 0.43mm/px · 3 of 13 slices shown]
[im 1/13]
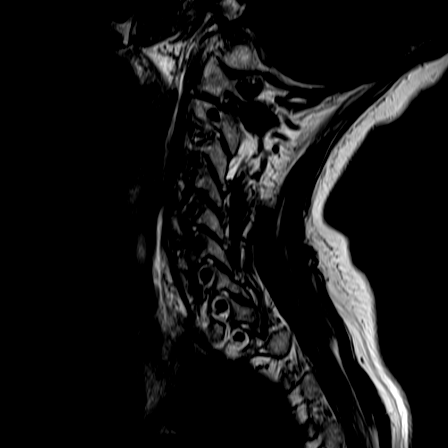
[im 9/13]
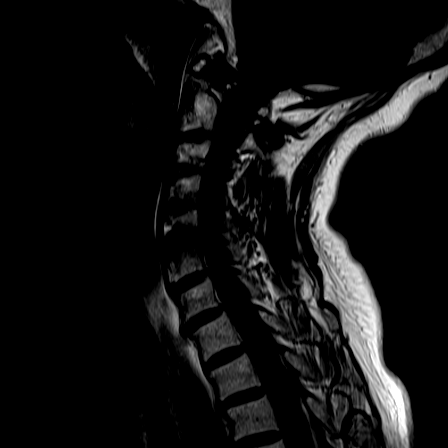
[im 13/13]
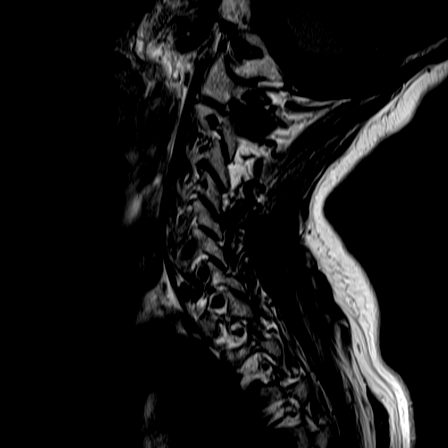

[Series 5: ir sagital · sagittal · 3.0mm · 0.22mm/px · 3 of 13 slices shown]
[im 1/13]
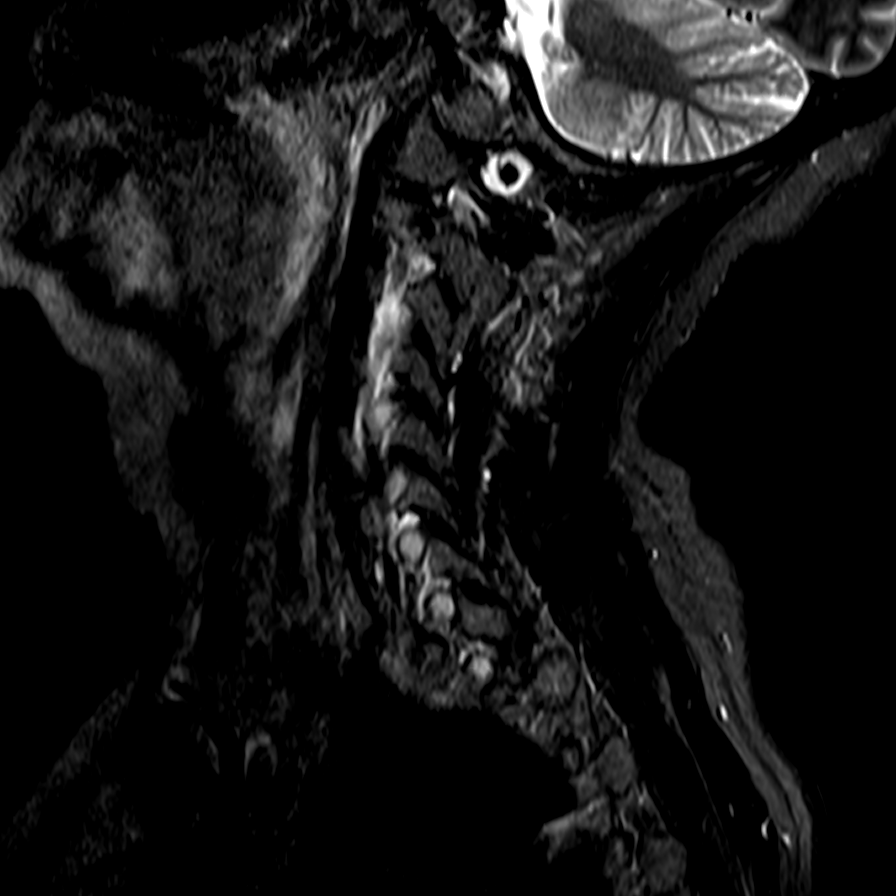
[im 9/13]
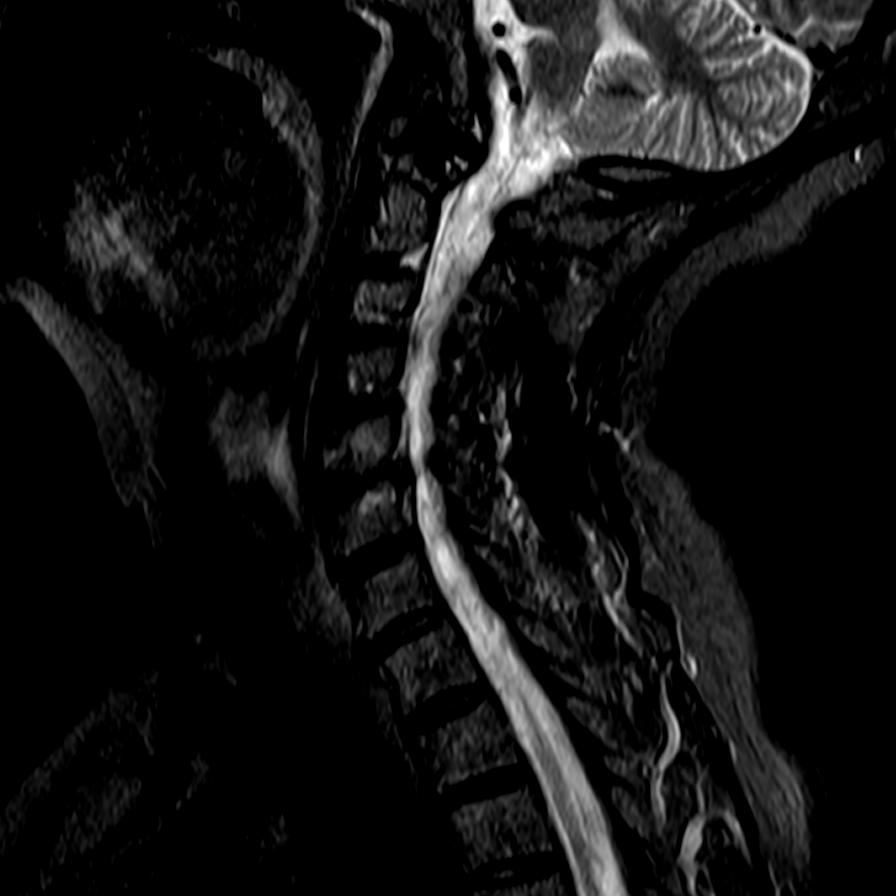
[im 13/13]
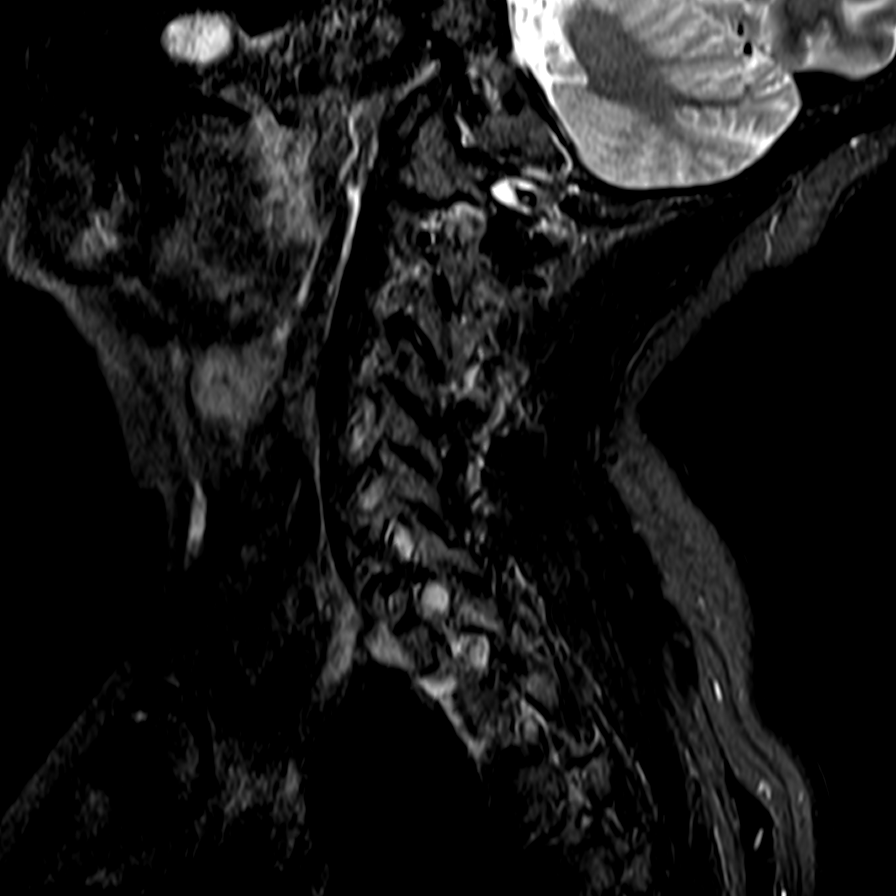

[Series 7: T2 · axial · 3.0mm · 0.22mm/px · z∈[-88,+9]mm · 5 of 36 slices shown (2 of 2)]
[im 1/36]
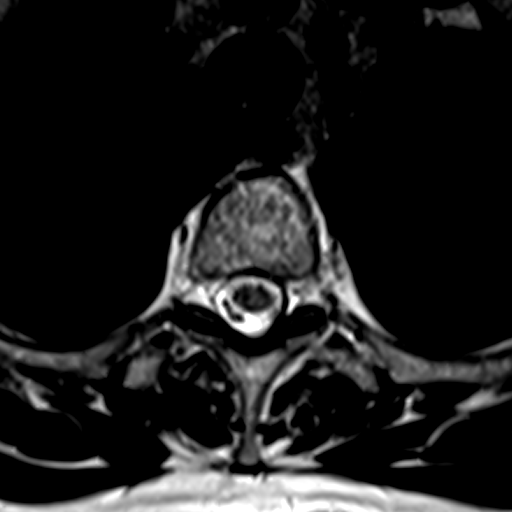
[im 7/36]
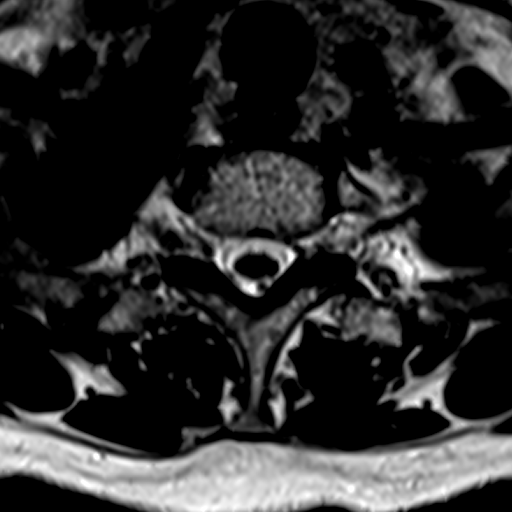
[im 10/36]
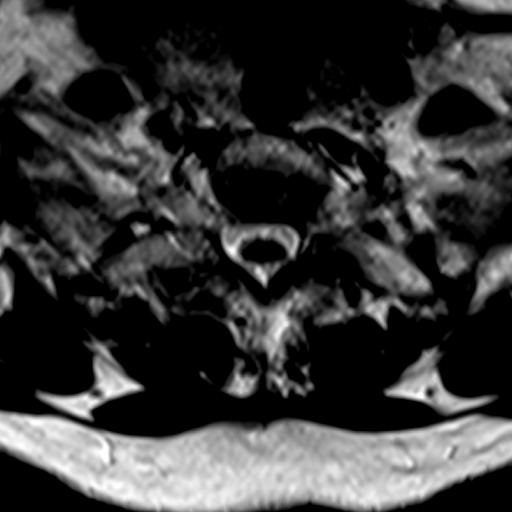
[im 20/36]
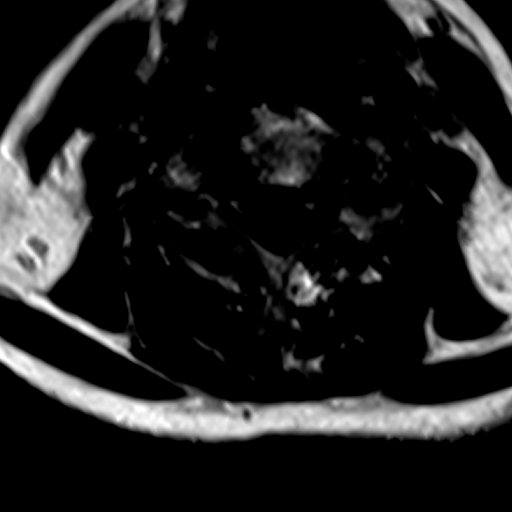
[im 32/36]
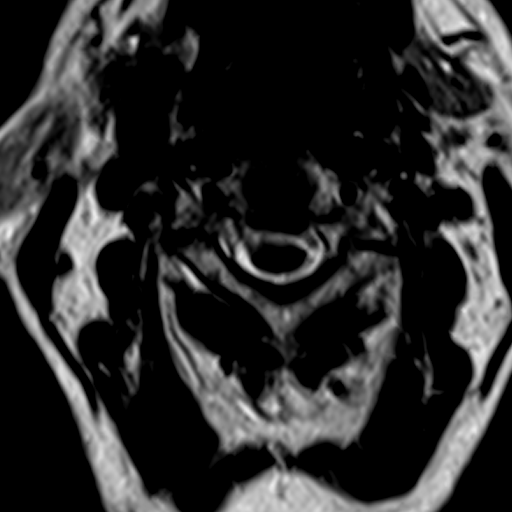

[15 of 48 positions shown; findings below may reference images not displayed]

FINDINGS: Normal cervical alignment. Negative for fracture or mass lesion.
Spinal cord signal is normal. Craniocervical junction is normal.

C2-3:  Negative

C3-4: Mild disc bulging and mild narrowing of the spinal canal.
Facet hypertrophy bilaterally.

C4-5: Disc degeneration with mild spondylosis. Bilateral facet
hypertrophy and mild spinal stenosis.

C5-6: Marked improvement in the large central disc protrusion seen
previously. There is disc degeneration and spondylosis. There is
flattening of the cord with moderate spinal stenosis. Neural
foraminal encroachment bilaterally due to spurring.

C6-7:  Negative

C7-T1:  Negative
IMPRESSION: Mild spinal stenosis at C3-4 and C4-5 secondary to spondylosis

At C5-6, there is marked improvement in the large central and
right-sided disc protrusion seen on 11/09/2012. There is moderate
spondylosis and moderate spinal stenosis at this level. No cord
signal abnormality.

## 2016-04-18 ENCOUNTER — Encounter: Payer: Self-pay | Admitting: Obstetrics and Gynecology

## 2016-04-18 ENCOUNTER — Ambulatory Visit (INDEPENDENT_AMBULATORY_CARE_PROVIDER_SITE_OTHER): Payer: PPO | Admitting: Obstetrics and Gynecology

## 2016-04-18 ENCOUNTER — Encounter (INDEPENDENT_AMBULATORY_CARE_PROVIDER_SITE_OTHER): Payer: Self-pay

## 2016-04-18 VITALS — BP 130/70 | HR 80 | Ht 61.0 in | Wt 136.8 lb

## 2016-04-18 DIAGNOSIS — N952 Postmenopausal atrophic vaginitis: Secondary | ICD-10-CM

## 2016-04-18 DIAGNOSIS — B0089 Other herpesviral infection: Secondary | ICD-10-CM | POA: Diagnosis not present

## 2016-04-18 DIAGNOSIS — R300 Dysuria: Secondary | ICD-10-CM | POA: Diagnosis not present

## 2016-04-18 HISTORY — DX: Postmenopausal atrophic vaginitis: N95.2

## 2016-04-18 LAB — POCT URINALYSIS DIPSTICK
Blood, UA: NEGATIVE
Glucose, UA: NEGATIVE
Ketones, UA: NEGATIVE
Leukocytes, UA: NEGATIVE
NITRITE UA: NEGATIVE
PROTEIN UA: NEGATIVE

## 2016-04-18 MED ORDER — ESTRADIOL 10 MCG VA TABS: 1.0000 | ORAL_TABLET | Freq: Every day | VAGINAL | 12 refills | Status: DC

## 2016-04-18 NOTE — Progress Notes (Signed)
[facility] Visit  04/18/16          Patient name: Carla [name] MRN [id number]  Date of birth: November 29, 1945  CC & HPI:  Carla [name] is a 71 y.o. female presenting today for dysuria onset 6 months ago. Pt notes that she had a blood test completed twice with her PCP that determined that she was HSV+. Pt was Rx acyclovir for her symptoms. Pt notes that she has been married for the past 36 years and she has only been sexually active with her husband. Pt denies any other symptoms. Pt notes that she has 3 children. Pt has had a hysterectomy. Denies hx of breast CA or blood clots.    ROS:  ROS +dysuria  Pertinent History Reviewed:   Reviewed: Significant for HSV 2+ Medical         Past Medical History:  Diagnosis Date  . ASCVD (arteriosclerotic cardiovascular disease)    No critical disease on cath in 8/97: mild AI with trival, if any l AS; negative stress nuclear in 2010  . Back pain   . Cervical disc herniation   . Chronic bronchitis   . Colon polyps   . Depression   . Elevated hemoglobin A1c   . Fasting hyperglycemia   . GERD (gastroesophageal reflux disease)   . HSV (herpes simplex virus) infection   . Hydronephrosis   . Hypertension   . Osteoporosis   . Peripheral vascular disease ([place])    stauts post aortabifemoral graft    . Secondary erythrocytosis 08/05/2012   Secondary to COPD  . Tobacco abuse    has stopped                              Surgical Hx:    Past Surgical History:  Procedure Laterality Date  . ABDOMINAL HYSTERECTOMY    . aorta bifem graft    . CHOLECYSTECTOMY    . COLONOSCOPY  06/23/2006   Dr. Fields:Normal colon without evidence of polyps, masses, inflammatory changes/Normal retroflex view of the rectum  . COLONOSCOPY N/A 08/24/2013   Procedure: COLONOSCOPY;  Surgeon: Danie Binder, MD;  [facility];  Service: Endoscopy;  Laterality: N/A;  10:30-moved to [place] notified pt  . lysis of adhesions  2000  . PARTIAL  HYSTERECTOMY    . right kidney surgery following damage during arterial surgery    . TOTAL ABDOMINAL HYSTERECTOMY W/ BILATERAL SALPINGOOPHORECTOMY  1975   Medications: Reviewed & Updated - see associated section                       Current Outpatient Prescriptions:  .  acyclovir (ZOVIRAX) 800 MG tablet, TAKE ONE TABLET BY MOUTH 2 TIMES DAILY., Disp: 60 tablet, Rfl: 2 .  alendronate (FOSAMAX) 70 MG tablet, TAKE 1 TABLET EACH WEEK 30 MIN BEFORE BREAKFAST WITH 8 OUNCES OF WATER FOR OSTEOPOROSIS., Disp: 4 tablet, Rfl: 4 .  ALPRAZolam (XANAX) 0.5 MG tablet, Take 1 tablet (0.5 mg total) by mouth 2 (two) times daily., Disp: 60 tablet, Rfl: 2 .  amLODipine (NORVASC) 10 MG tablet, TAKE (1) TABLET BY MOUTH DAILY., Disp: 90 tablet, Rfl: 1 .  aspirin EC 81 MG tablet, Take 81 mg by mouth daily., Disp: , Rfl:  .  atorvastatin (LIPITOR) 40 MG tablet, TAKE 1 TABLET BY MOUTH ONCE DAILY., Disp: 90 tablet, Rfl: 1 .  Cholecalciferol (VITAMIN D)  2000 UNITS CAPS, Take 1 capsule by mouth daily., Disp: , Rfl:  .  cyclobenzaprine (FLEXERIL) 10 MG tablet, TAKE ONE TABLET BY MOUTH 3 TIMES DAILY AS NEEDED FOR MUSCLE SPASM(S)., Disp: 30 tablet, Rfl: 2 .  FLUoxetine (PROZAC) 10 MG tablet, Take 1 tablet (10 mg total) by mouth daily., Disp: 30 tablet, Rfl: 5 .  gabapentin (NEURONTIN) 100 MG capsule, Take 2 capsules (200 mg total) by mouth at bedtime. (Patient taking differently: Take 100 mg by mouth at bedtime. ), Disp: 60 capsule, Rfl: 4 .  Ginkgo Biloba (GINKOBA PO), Take 2 tablets by mouth at bedtime., Disp: , Rfl:  .  [START ON 06/27/2016] HYDROcodone-acetaminophen (NORCO) 10-325 MG tablet, One tablet twice daily, Disp: 60 tablet, Rfl: 0 .  montelukast (SINGULAIR) 10 MG tablet, TAKE (1) TABLET BY MOUTH AT BEDTIME., Disp: 90 tablet, Rfl: 1 .  Multiple Vitamin (MULTIVITAMIN WITH MINERALS) TABS, Take 1 tablet by mouth every evening. , Disp: , Rfl:  .  Omega-3 Fatty Acids (FISH OIL PO), Take 1 capsule by mouth daily., Disp: ,  Rfl:  .  acyclovir (ZOVIRAX) 400 MG tablet, Take 1 tablet (400 mg total) by mouth 3 (three) times daily. (Patient not taking: Reported on 04/18/2016), Disp: 30 tablet, Rfl: 0 .  azelastine (ASTELIN) 0.1 % nasal spray, Place 2 sprays into both nostrils 2 (two) times daily. Use in each nostril as directed (Patient not taking: Reported on 04/18/2016), Disp: 30 mL, Rfl: 12 .  hydrocortisone 2.5 % cream, APPLY SPARINGLY TO VAGINAL AREA TWICE WEEKLY AS NEEDED FOR ITCHING. (Patient not taking: Reported on 04/18/2016), Disp: 30 g, Rfl: 0   Social History: Reviewed -  reports that she has quit smoking. Her smoking use included Cigarettes. She smoked 0.50 packs per day. She quit smokeless tobacco use about 3 years ago.  Objective Findings:  Vitals: Blood pressure 130/70, pulse 80, height 5\' 1"  (1.549 m), weight 136 lb 12.8 oz (62.1 kg).  Physical Examination: Pelvic - normal external genitalia, vulva, vagina, cervix, uterus and adnexa,  VULVA: normal appearing vulva with no masses, tenderness or lesions, no evidence of HSV 2+ at this time.  VAGINA: normal appearing vagina with normal color and discharge, no lesions, atrophic,  CERVIX: surgically absent,  UTERUS: surgically absent, vaginal cuff well healed,   Discussion: 1. Discussed with pt risks and benefits of HSV 2+ antibody without evidence of HSV 2+   At end of discussion, pt had opportunity to ask questions and has no further questions at this time.   Specific discussion of HSV 2+ antibody without evidence of HSV 2+ as noted above. Greater than 50% was spent in counseling and coordination of care with the patient.   Total time greater than: 25 minutes.    Assessment & Plan:   A:  1. Atrophic vaginal tissue 2 no active herpes infection, only HSV antibody + of unknown duration P:  1. vagifem tablets Rx hs  2. Follow up in 6 weeks   By signing my name below, I, Carla [name], attest that this documentation has been prepared under the  direction and in the presence of Jonnie Kind, MD. Electronically Signed: Soijett [name], ED Scribe. 04/18/16. 4:29 PM.  I personally performed the services described in this documentation, which was SCRIBED in my presence. The recorded information has been reviewed and considered accurate. It has been edited as necessary during review. Jonnie Kind, MD

## 2016-04-19 ENCOUNTER — Telehealth: Payer: Self-pay | Admitting: Obstetrics and Gynecology

## 2016-04-19 NOTE — Telephone Encounter (Signed)
Pt called stating the Estradiol 85mcg prescribed to her yesterday is $150 and she cannot afford it. Is there anything else that she could try that may be cheaper. Please advise.

## 2016-04-24 ENCOUNTER — Telehealth: Payer: Self-pay | Admitting: *Deleted

## 2016-04-24 MED ORDER — ESTROGENS, CONJUGATED 0.625 MG/GM VA CREA: 1.0000 | TOPICAL_CREAM | VAGINAL | 2 refills | Status: DC

## 2016-04-24 NOTE — Telephone Encounter (Signed)
Patient informed that vaginal cream was changed to Premarin. Pt stated she had already purchased the other and would try this next time.

## 2016-05-23 ENCOUNTER — Telehealth (HOSPITAL_COMMUNITY): Payer: Self-pay | Admitting: *Deleted

## 2016-05-23 NOTE — Telephone Encounter (Signed)
phone call, spoke with patient, she said she didn't know anything about a referral.   She said look like the doctor would have told her something about this.  She did not schedule appointment.

## 2016-05-24 ENCOUNTER — Other Ambulatory Visit: Payer: Self-pay | Admitting: Family Medicine

## 2016-05-30 ENCOUNTER — Ambulatory Visit: Payer: PPO | Admitting: Obstetrics and Gynecology

## 2016-06-04 ENCOUNTER — Ambulatory Visit: Payer: PPO | Admitting: Obstetrics and Gynecology

## 2016-06-12 ENCOUNTER — Ambulatory Visit: Payer: PPO | Admitting: Obstetrics and Gynecology

## 2016-06-26 ENCOUNTER — Encounter: Payer: Self-pay | Admitting: Obstetrics and Gynecology

## 2016-06-26 ENCOUNTER — Ambulatory Visit (INDEPENDENT_AMBULATORY_CARE_PROVIDER_SITE_OTHER): Payer: PPO | Admitting: Obstetrics and Gynecology

## 2016-06-26 VITALS — BP 122/70 | HR 83 | Wt 135.0 lb

## 2016-06-26 DIAGNOSIS — N952 Postmenopausal atrophic vaginitis: Secondary | ICD-10-CM

## 2016-06-26 MED ORDER — ESTRADIOL 10 MCG VA TABS: 1.0000 | ORAL_TABLET | VAGINAL | 3 refills | Status: DC

## 2016-06-26 NOTE — Patient Instructions (Signed)
Atrophic Vaginitis Atrophic vaginitis is when the tissues that line the vagina become dry and thin. This is caused by a drop in estrogen. Estrogen helps:  To keep the vagina moist.  To make a clear fluid that helps:  To lubricate the vagina for sex.  To protect the vagina from infection. If the lining of the vagina is dry and thin, it may:  Make sex painful. It may also cause bleeding.  Cause a feeling of:  Burning.  Irritation.  Itchiness.  Make an exam of your vagina painful. It may also cause bleeding.  Make you lose interest in sex.  Cause a burning feeling when you pee.  Make your vaginal fluid (discharge) brown or yellow. For some women, there are no symptoms. This condition is most common in women who do not get their regular menstrual periods anymore (menopause). This often starts when a woman is 24-40 years old. Follow these instructions at home:  Take medicines only as told by your doctor. Do not use any herbal or alternative medicines unless your doctor says it is okay.  Use over-the-counter products for dryness only as told by your doctor. These include:  Creams.  Lubricants.  Moisturizers.  Do not douche.  Do not use products that can make your vagina dry. These include:  Scented feminine sprays.  Scented tampons.  Scented soaps.  If it hurts to have sex, tell your sexual partner. Contact a doctor if:  Your discharge looks different than normal.  Your vagina has an unusual smell.  You have new symptoms.  Your symptoms do not get better with treatment.  Your symptoms get worse. This information is not intended to replace advice given to you by your health care provider. Make sure you discuss any questions you have with your health care provider. Document Released: 08/28/2007 Document Revised: 08/17/2015 Document Reviewed: 03/02/2014 Elsevier Interactive Patient Education  2017 [employer].

## 2016-06-26 NOTE — Progress Notes (Signed)
[facility] Clinic Visit  @DATE @            Patient name: Carla [name] MRN [id number]  Date of birth: [birth year]  CC & HPI:  Carla [name] is a 71 y.o. female presenting today for vulvar itching and burning for the last few days. She is prescribed Premarin cream by her PCP which she states is no longer working. She states the itching and burning is worsened with application of the Premarin. She states the vagifem has been helpful but has been expensive. Pt agrees to continue it 2x/wk or weekly as it helps  She is no longer sexually active. She has a history of HSV 2 from a blood test but no evidence of HSV 2 outbreak.   ROS:  ROS +dysuria  +vulvar itching/burning   Pertinent History Reviewed:   Reviewed: Significant for TAH with [facility]         Past Medical History:  Diagnosis Date  . ASCVD (arteriosclerotic cardiovascular disease)    No critical disease on cath in 8/97: mild AI with trival, if any l AS; negative stress nuclear in 2010  . Back pain   . Cervical disc herniation   . Chronic bronchitis   . Colon polyps   . Depression   . Elevated hemoglobin A1c   . Fasting hyperglycemia   . GERD (gastroesophageal reflux disease)   . HSV (herpes simplex virus) infection   . Hydronephrosis   . Hypertension   . Osteoporosis   . Peripheral vascular disease ([place])    stauts post aortabifemoral graft    . Secondary erythrocytosis 08/05/2012   Secondary to COPD  . Tobacco abuse    has stopped                              Surgical Hx:    Past Surgical History:  Procedure Laterality Date  . ABDOMINAL HYSTERECTOMY    . aorta bifem graft    . CHOLECYSTECTOMY    . COLONOSCOPY  06/23/2006   Dr. Fields:Normal colon without evidence of polyps, masses, inflammatory changes/Normal retroflex view of the rectum  . COLONOSCOPY N/A 08/24/2013   Procedure: COLONOSCOPY;  Surgeon: Danie Binder, MD;  Location: [facility];  Service: Endoscopy;  Laterality: N/A;  10:30-moved to  [place] notified pt  . lysis of adhesions  2000  . PARTIAL HYSTERECTOMY    . right kidney surgery following damage during arterial surgery    . TOTAL ABDOMINAL HYSTERECTOMY W/ BILATERAL SALPINGOOPHORECTOMY  1975   Medications: Reviewed & Updated - see associated section                       Current Outpatient Prescriptions:  .  acyclovir (ZOVIRAX) 400 MG tablet, Take 1 tablet (400 mg total) by mouth 3 (three) times daily., Disp: 30 tablet, Rfl: 0 .  alendronate (FOSAMAX) 70 MG tablet, TAKE 1 TABLET EACH WEEK 30 MIN BEFORE BREAKFAST WITH 8 OUNCES OF WATER FOR OSTEOPOROSIS., Disp: 4 tablet, Rfl: 4 .  ALPRAZolam (XANAX) 0.5 MG tablet, TAKE ONE TABLET BY MOUTH TWICE DAILY., Disp: 60 tablet, Rfl: 0 .  amLODipine (NORVASC) 10 MG tablet, TAKE (1) TABLET BY MOUTH DAILY., Disp: 90 tablet, Rfl: 1 .  aspirin EC 81 MG tablet, Take 81 mg by mouth daily., Disp: , Rfl:  .  atorvastatin (LIPITOR) 40 MG tablet, TAKE 1 TABLET BY MOUTH ONCE  DAILY., Disp: 90 tablet, Rfl: 1 .  Cholecalciferol (VITAMIN D) 2000 UNITS CAPS, Take 1 capsule by mouth daily., Disp: , Rfl:  .  conjugated estrogens (PREMARIN) vaginal cream, Place 1 Applicatorful vaginally 3 (three) times a week. For vaginal thinning and irritation, Disp: 42.5 g, Rfl: 2 .  cyclobenzaprine (FLEXERIL) 10 MG tablet, TAKE ONE TABLET BY MOUTH 3 TIMES DAILY AS NEEDED FOR MUSCLE SPASM(S)., Disp: 30 tablet, Rfl: 2 .  FLUoxetine (PROZAC) 10 MG tablet, Take 1 tablet (10 mg total) by mouth daily., Disp: 30 tablet, Rfl: 5 .  gabapentin (NEURONTIN) 100 MG capsule, Take 2 capsules (200 mg total) by mouth at bedtime. (Patient taking differently: Take 100 mg by mouth at bedtime. ), Disp: 60 capsule, Rfl: 4 .  Ginkgo Biloba (GINKOBA PO), Take 2 tablets by mouth at bedtime., Disp: , Rfl:  .  [START ON 06/27/2016] HYDROcodone-acetaminophen (NORCO) 10-325 MG tablet, One tablet twice daily, Disp: 60 tablet, Rfl: 0 .  hydrocortisone 2.5 % cream, APPLY SPARINGLY TO VAGINAL  AREA TWICE WEEKLY AS NEEDED FOR ITCHING., Disp: 30 g, Rfl: 0 .  Multiple Vitamin (MULTIVITAMIN WITH MINERALS) TABS, Take 1 tablet by mouth every evening. , Disp: , Rfl:  .  Omega-3 Fatty Acids (FISH OIL PO), Take 1 capsule by mouth daily., Disp: , Rfl:  .  acyclovir (ZOVIRAX) 800 MG tablet, TAKE ONE TABLET BY MOUTH 2 TIMES DAILY. (Patient not taking: Reported on 06/26/2016), Disp: 60 tablet, Rfl: 2 .  montelukast (SINGULAIR) 10 MG tablet, TAKE (1) TABLET BY MOUTH AT BEDTIME. (Patient not taking: Reported on 06/26/2016), Disp: 90 tablet, Rfl: 1   Social History: Reviewed -  reports that she has quit smoking. Her smoking use included Cigarettes. She smoked 0.50 packs per day. She quit smokeless tobacco use about 4 years ago.  Objective Findings:  Vitals: Blood pressure 122/70, pulse 83, weight 135 lb (61.2 kg).  Physical Examination: General appearance - alert, well appearing, and in no distress Mental status - alert, oriented to person, place, and time Pelvic - patient deferred , making assessment more challenging.  The provider spent over 25 minutes with the visit , including previsit review, and documentation,with >than 50% spent in counseling and coordination of care. Specific discussion of vagifem vs premarin and cost vs benefit of each.    Assessment & Plan:   A:  1. Postmenopausal atrophic vaginitis    P:  1. Renew vagifem to use once weekly  2. Follow up in 1 year or PRN    By signing my name below, I, Sonum Patel, attest that this documentation has been prepared under the direction and in the presence of Jonnie Kind, MD. Electronically Signed: Sonum Patel, Education administrator. 06/26/16. 12:06 PM.  I personally performed the services described in this documentation, which was SCRIBED in my presence. The recorded information has been reviewed and considered accurate. It has been edited as necessary during review. Jonnie Kind, MD

## 2016-07-01 ENCOUNTER — Encounter: Payer: Self-pay | Admitting: Family Medicine

## 2016-07-01 ENCOUNTER — Ambulatory Visit: Payer: PPO | Admitting: Family Medicine

## 2016-07-08 ENCOUNTER — Telehealth: Payer: Self-pay | Admitting: Family Medicine

## 2016-07-08 NOTE — Telephone Encounter (Signed)
Patient called re appt on 4/9 that she was unable to keep because her husband was in the hospital.  She was scheduled for a 42month f/u.  Patient was given an appt for 05/23 but feels like this is too long of a wait.  I offered to put her on a cancellation list.

## 2016-07-22 NOTE — Telephone Encounter (Signed)
Has appt

## 2016-07-23 ENCOUNTER — Encounter: Payer: Self-pay | Admitting: Family Medicine

## 2016-07-23 ENCOUNTER — Ambulatory Visit (INDEPENDENT_AMBULATORY_CARE_PROVIDER_SITE_OTHER): Payer: PPO | Admitting: Family Medicine

## 2016-07-23 VITALS — BP 118/68 | HR 82 | Resp 16 | Ht 61.0 in | Wt 136.1 lb

## 2016-07-23 DIAGNOSIS — F329 Major depressive disorder, single episode, unspecified: Secondary | ICD-10-CM

## 2016-07-23 DIAGNOSIS — M549 Dorsalgia, unspecified: Secondary | ICD-10-CM | POA: Diagnosis not present

## 2016-07-23 DIAGNOSIS — E559 Vitamin D deficiency, unspecified: Secondary | ICD-10-CM | POA: Diagnosis not present

## 2016-07-23 DIAGNOSIS — F411 Generalized anxiety disorder: Secondary | ICD-10-CM

## 2016-07-23 DIAGNOSIS — I2510 Atherosclerotic heart disease of native coronary artery without angina pectoris: Secondary | ICD-10-CM

## 2016-07-23 DIAGNOSIS — E785 Hyperlipidemia, unspecified: Secondary | ICD-10-CM | POA: Diagnosis not present

## 2016-07-23 DIAGNOSIS — N952 Postmenopausal atrophic vaginitis: Secondary | ICD-10-CM

## 2016-07-23 DIAGNOSIS — E1151 Type 2 diabetes mellitus with diabetic peripheral angiopathy without gangrene: Secondary | ICD-10-CM | POA: Diagnosis not present

## 2016-07-23 DIAGNOSIS — G8929 Other chronic pain: Secondary | ICD-10-CM | POA: Diagnosis not present

## 2016-07-23 DIAGNOSIS — I10 Essential (primary) hypertension: Secondary | ICD-10-CM

## 2016-07-23 DIAGNOSIS — F32A Depression, unspecified: Secondary | ICD-10-CM

## 2016-07-23 LAB — COMPLETE METABOLIC PANEL WITH GFR
ALT: 24 U/L (ref 6–29)
AST: 39 U/L — AB (ref 10–35)
Albumin: 4.2 g/dL (ref 3.6–5.1)
Alkaline Phosphatase: 96 U/L (ref 33–130)
BILIRUBIN TOTAL: 0.4 mg/dL (ref 0.2–1.2)
BUN: 11 mg/dL (ref 7–25)
CHLORIDE: 104 mmol/L (ref 98–110)
CO2: 27 mmol/L (ref 20–31)
Calcium: 9.4 mg/dL (ref 8.6–10.4)
Creat: 1.02 mg/dL — ABNORMAL HIGH (ref 0.60–0.93)
GFR, Est African American: 64 mL/min (ref >=60)
GFR, Est Non African American: 55 mL/min — ABNORMAL LOW (ref >=60)
GLUCOSE: 124 mg/dL — AB (ref 65–99)
Potassium: 3.6 mmol/L (ref 3.5–5.3)
SODIUM: 140 mmol/L (ref 135–146)
Total Protein: 7.1 g/dL (ref 6.1–8.1)

## 2016-07-23 LAB — LIPID PANEL
Cholesterol: 196 mg/dL (ref <200)
HDL: 49 mg/dL — ABNORMAL LOW (ref >50)
LDL Cholesterol: 97 mg/dL (ref <100)
TRIGLYCERIDES: 249 mg/dL — AB (ref <150)
Total CHOL/HDL Ratio: 4 Ratio (ref <5.0)
VLDL: 50 mg/dL — AB (ref <30)

## 2016-07-23 LAB — TSH: TSH: 3.95 m[IU]/L

## 2016-07-23 MED ORDER — HYDROCODONE-ACETAMINOPHEN 10-325 MG PO TABS: ORAL_TABLET | ORAL | 0 refills | Status: DC

## 2016-07-23 MED ORDER — ALPRAZOLAM 0.5 MG PO TABS
0.5000 mg | ORAL_TABLET | Freq: Two times a day (BID) | ORAL | 3 refills | Status: DC
Start: 2016-07-23 — End: 2016-07-23

## 2016-07-23 MED ORDER — ALPRAZOLAM 0.5 MG PO TABS
0.5000 mg | ORAL_TABLET | Freq: Two times a day (BID) | ORAL | 2 refills | Status: DC
Start: 2016-07-23 — End: 2016-09-13

## 2016-07-23 NOTE — Assessment & Plan Note (Addendum)
Updated lab today. Carla [name] is reminded of the importance of commitment to daily physical activity for 30 minutes or more, as able and the need to limit carbohydrate intake to 30 to 60 grams per meal to help with blood sugar control.   The need to take medication as prescribed, test blood sugar as directed, and to call between visits if there is a concern that blood sugar is uncontrolled is also discussed.   Carla [name] is reminded of the importance of daily foot exam, annual eye examination, and good blood sugar, blood pressure and cholesterol control.  Diabetic Labs Latest Ref Rng & Units 09/27/2015 07/11/2015 05/31/2015 05/30/2015 05/01/2015  HbA1c 4.8 - 5.6 % - - 6.5(H) - -  Microalbumin Not estab mg/dL - 25.9 - - -  Micro/Creat Ratio <30 mcg/mg creat - 48(H) - - -  Chol 0 - 200 mg/dL - - 136 - 159  HDL >40 mg/dL - - 41 - 55  Calc LDL 0 - 99 mg/dL - - 42 - 64  Triglycerides <150 mg/dL - - 263(H) - 200(H)  Creatinine 0.44 - 1.00 mg/dL 0.96 - 0.91 1.09(H) 0.87   BP/Weight 07/23/2016 06/26/2016 04/18/2016 04/01/2016 02/29/2016 10/30/2015 0/12/2723  Systolic BP 366 440 347 425 956 387 564  Diastolic BP 68 70 70 70 74 74 74  Wt. (Lbs) 136.12 135 136.8 134.12 135.12 135 134  BMI 25.72 25.51 25.85 25.34 25.53 25.51 25.33   Foot/eye exam completion dates Latest Ref Rng & Units 07/23/2016 02/02/2015  Eye Exam No Retinopathy - -  Foot Form Completion - Done Done

## 2016-07-23 NOTE — Assessment & Plan Note (Signed)
Reports adequate pain control on current regime.Pt is able to function Registry reviewed at visit. Twelve weeks of medication is pescribed

## 2016-07-23 NOTE — Assessment & Plan Note (Signed)
No change in medication management though she reports anxiety

## 2016-07-23 NOTE — Assessment & Plan Note (Signed)
Controlled, no change in medication DASH diet and commitment to daily physical activity for a minimum of 30 minutes discussed and encouraged, as a part of hypertension management. The importance of attaining a healthy weight is also discussed.  BP/Weight 07/23/2016 06/26/2016 04/18/2016 04/01/2016 02/29/2016 10/30/2015 8/33/3832  Systolic BP 919 166 060 045 997 741 423  Diastolic BP 68 70 70 70 74 74 74  Wt. (Lbs) 136.12 135 136.8 134.12 135.12 135 134  BMI 25.72 25.51 25.85 25.34 25.53 25.51 25.33

## 2016-07-23 NOTE — Assessment & Plan Note (Signed)
Excellent response to topical estrogen from gyne

## 2016-07-23 NOTE — Assessment & Plan Note (Signed)
Controlled, no change in medication  

## 2016-07-23 NOTE — Patient Instructions (Signed)
f/u July 24 call if you need me sooner, cancel sooner appt  Microalb today  Lipid, cmp and eGFR, hBA1cand TSH and vit D today   You are referred to the heart specialist due to new concern of exertional fatigue and intermittent chest pain for the past 6 to 8 weeks 

## 2016-07-23 NOTE — Assessment & Plan Note (Signed)
Hyperlipidemia:Low fat diet discussed and encouraged.   Lipid Panel  Lab Results  Component Value Date   CHOL 136 05/31/2015   HDL 41 05/31/2015   LDLCALC 42 05/31/2015   LDLDIRECT 166 (H) 10/11/2009   TRIG 263 (H) 05/31/2015   CHOLHDL 3.3 05/31/2015   Uncontrolled, updated lab today

## 2016-07-23 NOTE — Assessment & Plan Note (Signed)
8 week h/o intermittent chest pain radiating to neck at times associated with activity, will refer to cardiology for evaluation, she has hyperlipidemia, is a diet controlled diabetic, has h/o bilateral iliac stents, and past h/o nicotine use

## 2016-07-23 NOTE — Progress Notes (Signed)
WRENLEY Carla [name]     MRN: [id number]      DOB: 1945/10/18   HPI Carla [name] is here for follow up and re-evaluation of chronic medical conditions, medication management and review of any available recent lab and radiology data.  Chronic pain management is specifically addressed at the visit, states she has been having increased chest pain radiating to neck intermittently for the past 6 to 8 weeks , sometimes associated with activity, this occurred this morning , but she reports being pain free at this time.Has had bilateral iliac stents and is concerned about possible blockage in her heart States vaginal itch is much improved with use of topical estrogen per gyne though the medication is expensive Preventive health is updated, specifically  Cancer screening and Immunization.   The PT denies any adverse reactions to current medications since the last visit.    ROS Denies recent fever or chills. Denies sinus pressure, nasal congestion, ear pain or sore throat. Denies chest congestion, productive cough or wheezing. Denies , palpitations and leg swelling Denies abdominal pain, nausea, vomiting,diarrhea or constipation.   Denies dysuria, frequency, hesitancy or incontinence. Denies  swelling and limitation in mobility. Denies headaches, seizures, numbness, or tingling. Denies depression, anxiety or insomnia. Denies skin break down or rash.   PE  BP 118/68   Pulse 82   Resp 16   Ht 5\' 1"  (1.549 m)   Wt 136 lb 1.9 oz (61.7 kg)   SpO2 98%   BMI 25.72 kg/m   Patient alert and oriented and in no cardiopulmonary distress.  HEENT: No facial asymmetry, EOMI,   oropharynx pink and moist.  Neck decreased though adequate rOM no JVD, no mass.  Chest: Clear to auscultation bilaterally.  CVS: S1, S2 no murmurs, no S3.Regular rate.  ABD: Soft non tender.   Ext: No edema  MS: Adequate ROM spine, shoulders, hips and knees.  Skin: Intact, no ulcerations or rash noted.  Psych: Good eye  contact, normal affect. Memory intact not anxious or depressed appearing.  CNS: CN 2-12 intact, power,  normal throughout.no focal deficits noted.   Assessment & Plan  Arteriosclerotic cardiovascular disease (ASCVD) 8 week h/o intermittent chest pain radiating to neck at times associated with activity, will refer to cardiology for evaluation, she has hyperlipidemia, is a diet controlled diabetic, has h/o bilateral iliac stents, and past h/o nicotine use  Essential hypertension Controlled, no change in medication DASH diet and commitment to daily physical activity for a minimum of 30 minutes discussed and encouraged, as a part of hypertension management. The importance of attaining a healthy weight is also discussed.  BP/Weight 07/23/2016 06/26/2016 04/18/2016 04/01/2016 02/29/2016 10/30/2015 7/42/5956  Systolic BP 387 564 332 951 884 166 063  Diastolic BP 68 70 70 70 74 74 74  Wt. (Lbs) 136.12 135 136.8 134.12 135.12 135 134  BMI 25.72 25.51 25.85 25.34 25.53 25.51 25.33       Well controlled type 2 diabetes mellitus with peripheral circulatory disorder ([place]) Updated lab today. [name] is reminded of the importance of commitment to daily physical activity for 30 minutes or more, as able and the need to limit carbohydrate intake to 30 to 60 grams per meal to help with blood sugar control.   The need to take medication as prescribed, test blood sugar as directed, and to call between visits if there is a concern that blood sugar is uncontrolled is also discussed.   [name] is reminded of the importance of  daily foot exam, annual eye examination, and good blood sugar, blood pressure and cholesterol control.  Diabetic Labs Latest Ref Rng & Units 09/27/2015 07/11/2015 05/31/2015 05/30/2015 05/01/2015  HbA1c 4.8 - 5.6 % - - 6.5(H) - -  Microalbumin Not estab mg/dL - 25.9 - - -  Micro/Creat Ratio <30 mcg/mg creat - 48(H) - - -  Chol 0 - 200 mg/dL - - 136 - 159  HDL >40 mg/dL - - 41 - 55  Calc  LDL 0 - 99 mg/dL - - 42 - 64  Triglycerides <150 mg/dL - - 263(H) - 200(H)  Creatinine 0.44 - 1.00 mg/dL 0.96 - 0.91 1.09(H) 0.87   BP/Weight 07/23/2016 06/26/2016 04/18/2016 04/01/2016 02/29/2016 10/30/2015 3/66/4403  Systolic BP 474 259 563 875 643 329 518  Diastolic BP 68 70 70 70 74 74 74  Wt. (Lbs) 136.12 135 136.8 134.12 135.12 135 134  BMI 25.72 25.51 25.85 25.34 25.53 25.51 25.33   Foot/eye exam completion dates Latest Ref Rng & Units 07/23/2016 02/02/2015  Eye Exam No Retinopathy - -  Foot Form Completion - Done Done        Depression Controlled, no change in medication   Encounter for chronic pain management Reports adequate pain control on current regime.Pt is able to function Registry reviewed at visit. Twelve weeks of medication is pescribed  GAD (generalized anxiety disorder) No change in medication management though she reports anxiety  Hyperlipidemia LDL goal <100 Hyperlipidemia:Low fat diet discussed and encouraged.   Lipid Panel  Lab Results  Component Value Date   CHOL 136 05/31/2015   HDL 41 05/31/2015   LDLCALC 42 05/31/2015   LDLDIRECT 166 (H) 10/11/2009   TRIG 263 (H) 05/31/2015   CHOLHDL 3.3 05/31/2015   Uncontrolled, updated lab today   Atrophic vaginitis Excellent response to topical estrogen from gyne

## 2016-07-24 LAB — VITAMIN D 25 HYDROXY (VIT D DEFICIENCY, FRACTURES): VIT D 25 HYDROXY: 30 ng/mL (ref 30–100)

## 2016-07-24 LAB — HEMOGLOBIN A1C
Hgb A1c MFr Bld: 6.1 % — ABNORMAL HIGH (ref <5.7)
Mean Plasma Glucose: 128 mg/dL

## 2016-07-30 ENCOUNTER — Encounter: Payer: Self-pay | Admitting: Cardiology

## 2016-08-14 ENCOUNTER — Ambulatory Visit: Payer: PPO | Admitting: Family Medicine

## 2016-08-30 ENCOUNTER — Ambulatory Visit: Payer: PPO | Admitting: Cardiovascular Disease

## 2016-09-13 ENCOUNTER — Other Ambulatory Visit: Payer: Self-pay

## 2016-09-13 ENCOUNTER — Telehealth: Payer: Self-pay

## 2016-09-13 MED ORDER — ALPRAZOLAM 0.5 MG PO TABS: 0.5000 mg | ORAL_TABLET | Freq: Two times a day (BID) | ORAL | 1 refills | Status: DC

## 2016-09-13 NOTE — Telephone Encounter (Signed)
Pt called and said her nerve med has been cut in 1/2 and she is not able to deal with this.  Please call her next week.  [phone]

## 2016-09-13 NOTE — Telephone Encounter (Signed)
Corrected the quantity at the pharmacy

## 2016-10-11 ENCOUNTER — Other Ambulatory Visit: Payer: Self-pay | Admitting: Family Medicine

## 2016-10-14 ENCOUNTER — Other Ambulatory Visit (HOSPITAL_COMMUNITY)
Admission: AD | Admit: 2016-10-14 | Discharge: 2016-10-14 | Disposition: A | Discharge status: Home / Self Care | Payer: PPO | Source: Skilled Nursing Facility | Attending: Family Medicine | Admitting: Family Medicine

## 2016-10-14 ENCOUNTER — Ambulatory Visit (INDEPENDENT_AMBULATORY_CARE_PROVIDER_SITE_OTHER): Payer: PPO | Admitting: Family Medicine

## 2016-10-14 ENCOUNTER — Encounter: Payer: Self-pay | Admitting: Family Medicine

## 2016-10-14 VITALS — BP 138/70 | HR 73 | Resp 16 | Ht 61.0 in | Wt 133.0 lb

## 2016-10-14 DIAGNOSIS — E785 Hyperlipidemia, unspecified: Secondary | ICD-10-CM

## 2016-10-14 DIAGNOSIS — E559 Vitamin D deficiency, unspecified: Secondary | ICD-10-CM | POA: Diagnosis not present

## 2016-10-14 DIAGNOSIS — M549 Dorsalgia, unspecified: Secondary | ICD-10-CM | POA: Diagnosis not present

## 2016-10-14 DIAGNOSIS — G8929 Other chronic pain: Secondary | ICD-10-CM | POA: Diagnosis not present

## 2016-10-14 DIAGNOSIS — F411 Generalized anxiety disorder: Secondary | ICD-10-CM | POA: Diagnosis not present

## 2016-10-14 DIAGNOSIS — M654 Radial styloid tenosynovitis [de Quervain]: Secondary | ICD-10-CM

## 2016-10-14 DIAGNOSIS — E1151 Type 2 diabetes mellitus with diabetic peripheral angiopathy without gangrene: Secondary | ICD-10-CM | POA: Insufficient documentation

## 2016-10-14 DIAGNOSIS — I10 Essential (primary) hypertension: Secondary | ICD-10-CM

## 2016-10-14 HISTORY — DX: Radial styloid tenosynovitis (de quervain): M65.4

## 2016-10-14 LAB — COMPLETE METABOLIC PANEL WITH GFR
ALT: 16 U/L (ref 6–29)
AST: 23 U/L (ref 10–35)
Albumin: 4.6 g/dL (ref 3.6–5.1)
Alkaline Phosphatase: 108 U/L (ref 33–130)
BUN: 18 mg/dL (ref 7–25)
CALCIUM: 10.4 mg/dL (ref 8.6–10.4)
CO2: 28 mmol/L (ref 20–31)
Chloride: 102 mmol/L (ref 98–110)
Creat: 0.98 mg/dL — ABNORMAL HIGH (ref 0.60–0.93)
GFR, EST NON AFRICAN AMERICAN: 58 mL/min — AB (ref >=60)
GFR, Est African American: 67 mL/min (ref >=60)
Glucose, Bld: 100 mg/dL — ABNORMAL HIGH (ref 65–99)
Potassium: 5.3 mmol/L (ref 3.5–5.3)
SODIUM: 139 mmol/L (ref 135–146)
Total Bilirubin: 0.5 mg/dL (ref 0.2–1.2)
Total Protein: 7.4 g/dL (ref 6.1–8.1)

## 2016-10-14 LAB — LIPID PANEL
CHOL/HDL RATIO: 6.7 ratio — AB (ref <5.0)
Cholesterol: 303 mg/dL — ABNORMAL HIGH (ref <200)
HDL: 45 mg/dL — ABNORMAL LOW (ref >50)
LDL Cholesterol: 192 mg/dL — ABNORMAL HIGH (ref <100)
Triglycerides: 330 mg/dL — ABNORMAL HIGH (ref <150)
VLDL: 66 mg/dL — AB (ref <30)

## 2016-10-14 MED ORDER — HYDROCODONE-ACETAMINOPHEN 10-325 MG PO TABS: ORAL_TABLET | ORAL | 0 refills | Status: DC

## 2016-10-14 NOTE — Assessment & Plan Note (Addendum)
One month h/o acute pain and swelling,  no trauma, refer to ortho, tender on exam with reduced ROM of thumb

## 2016-10-14 NOTE — Patient Instructions (Addendum)
F/u week of October 29, with MMSE,call if you need me before.  No change in medication  Please cut back on fried and fatty foods   Microalb today from office    Fasting lipid, cmp and EGFr, hBa1C  1 week before f/u  You are referred to Dr Luna Glasgow re wrist , right   Thank you  for choosing [facility]. We consider it a privelige to serve you.  Delivering excellent health care in a caring and  compassionate way is our goal.  Partnering with you,  so that together we can achieve this goal is our strategy.

## 2016-10-15 LAB — MICROALBUMIN / CREATININE URINE RATIO
CREATININE, UR: 94.9 mg/dL
MICROALB/CREAT RATIO: 13.1 mg/g{creat} (ref 0.0–30.0)
Microalb, Ur: 12.4 ug/mL — ABNORMAL HIGH

## 2016-10-15 LAB — HEMOGLOBIN A1C
HEMOGLOBIN A1C: 6 % — AB (ref <5.7)
Mean Plasma Glucose: 126 mg/dL

## 2016-10-16 ENCOUNTER — Ambulatory Visit: Payer: PPO | Admitting: Family Medicine

## 2016-10-24 ENCOUNTER — Other Ambulatory Visit: Payer: Self-pay | Admitting: Family Medicine

## 2016-10-27 ENCOUNTER — Encounter: Payer: Self-pay | Admitting: Family Medicine

## 2016-10-27 NOTE — Progress Notes (Signed)
Carla [name]     MRN: [id number]      DOB: 07/05/1945   HPI Carla [name] is here for follow up and re-evaluation of chronic medical conditions, medication management, in particular pain management and review of any available recent lab and radiology data.  Preventive health is updated, specifically  Cancer screening and Immunization.   C/o right wrist pain with reduced mobility x 1 month, no trauma  ROS Denies recent fever or chills. Denies sinus pressure, nasal congestion, ear pain or sore throat. Denies chest congestion, productive cough or wheezing. Denies chest pains, palpitations and leg swelling Denies abdominal pain, nausea, vomiting,diarrhea or constipation.   Denies dysuria, frequency, hesitancy or incontinence. Denies headaches, seizures, numbness, or tingling. Denies depression, uncontrolled  anxiety or insomnia. Denies skin break down or rash.   PE  BP 138/70   Pulse 73   Resp 16   Ht 5\' 1"  (1.549 m)   Wt 133 lb (60.3 kg)   SpO2 97%   BMI 25.13 kg/m   Patient alert and oriented and in no cardiopulmonary distress.  HEENT: No facial asymmetry, EOMI,   oropharynx pink and moist.  Neck supple no JVD, no mass.  Chest: Clear to auscultation bilaterally.Decreased though adequate air entry CVS: S1, S2 no murmurs, no S3.Regular rate.  ABD: Soft non tender.   Ext: No edema  MS: Adequate  though reduced  ROM lumbar  spine, shoulders, hips and knees. Decreased ROM right thumb with tenderness on palpation   Skin: Intact, no ulcerations or rash noted.  Psych: Good eye contact, normal affect. Memory intact not anxious or depressed appearing.  CNS: CN 2-12 intact, power,  normal throughout.no focal deficits noted.   Assessment & Plan  De Quervain's disease (radial styloid tenosynovitis) One month h/o acute pain and swelling,  no trauma, refer to ortho, tender on exam with reduced ROM of thumb  Encounter for chronic pain management The patient's Controlled  Substance registry is reviewed and compliance confirmed. Adequacy of  Pain control and level of function is assessed. Medication dosing is adjusted as deemed appropriate. Twelve weeks of medication is prescribed , patient signs for the script and is provided with a follow up appointment between 11 to 12 weeks .   GAD (generalized anxiety disorder) Controlled, no change in medication   Hyperlipidemia LDL goal <100 Hyperlipidemia:Low fat diet discussed and encouraged.   Lipid Panel  Lab Results  Component Value Date   CHOL 303 (H) 10/14/2016   HDL 45 (L) 10/14/2016   LDLCALC 192 (H) 10/14/2016   LDLDIRECT 166 (H) 10/11/2009   TRIG 330 (H) 10/14/2016   CHOLHDL 6.7 (H) 10/14/2016    Uncontrolled needs to reduce fried and fatty foods and take meds prescribed  Well controlled type 2 diabetes mellitus with peripheral circulatory disorder ([place]) Carla [name] is reminded of the importance of commitment to daily physical activity for 30 minutes or more, as able and the need to limit carbohydrate intake to 30 to 60 grams per meal to help with blood sugar control.   The need to take medication as prescribed, test blood sugar as directed, and to call between visits if there is a concern that blood sugar is uncontrolled is also discussed.   Carla [name] is reminded of the importance of daily foot exam, annual eye examination, and good blood sugar, blood pressure and cholesterol control. Controlled, no change in medication   Diabetic Labs Latest Ref Rng & Units 10/14/2016 07/23/2016 09/27/2015 07/11/2015 05/31/2015  HbA1c <5.7 % 6.0(H) 6.1(H) - - 6.5(H)  Microalbumin Not Estab. ug/mL 12.4(H) - - 25.9 -  Micro/Creat Ratio 0.0 - 30.0 mg/g creat 13.1 - - 48(H) -  Chol <200 mg/dL 303(H) 196 - - 136  HDL >50 mg/dL 45(L) 49(L) - - 41  Calc LDL <100 mg/dL 192(H) 97 - - 42  Triglycerides <150 mg/dL 330(H) 249(H) - - 263(H)  Creatinine 0.60 - 0.93 mg/dL 0.98(H) 1.02(H) 0.96 - 0.91   BP/Weight 10/14/2016  07/23/2016 06/26/2016 04/18/2016 04/01/2016 86/10/2572 11/26/5519  Systolic BP 747 159 539 672 897 915 041  Diastolic BP 70 68 70 70 70 74 74  Wt. (Lbs) 133 136.12 135 136.8 134.12 135.12 135  BMI 25.13 25.72 25.51 25.85 25.34 25.53 25.51   Foot/eye exam completion dates Latest Ref Rng & Units 07/23/2016 02/02/2015  Eye Exam No Retinopathy - -  Foot Form Completion - Done Done

## 2016-10-27 NOTE — Assessment & Plan Note (Signed)
Carla [name] is reminded of the importance of commitment to daily physical activity for 30 minutes or more, as able and the need to limit carbohydrate intake to 30 to 60 grams per meal to help with blood sugar control.   The need to take medication as prescribed, test blood sugar as directed, and to call between visits if there is a concern that blood sugar is uncontrolled is also discussed.   Carla [name] is reminded of the importance of daily foot exam, annual eye examination, and good blood sugar, blood pressure and cholesterol control. Controlled, no change in medication   Diabetic Labs Latest Ref Rng & Units 10/14/2016 07/23/2016 09/27/2015 07/11/2015 05/31/2015  HbA1c <5.7 % 6.0(H) 6.1(H) - - 6.5(H)  Microalbumin Not Estab. ug/mL 12.4(H) - - 25.9 -  Micro/Creat Ratio 0.0 - 30.0 mg/g creat 13.1 - - 48(H) -  Chol <200 mg/dL 303(H) 196 - - 136  HDL >50 mg/dL 45(L) 49(L) - - 41  Calc LDL <100 mg/dL 192(H) 97 - - 42  Triglycerides <150 mg/dL 330(H) 249(H) - - 263(H)  Creatinine 0.60 - 0.93 mg/dL 0.98(H) 1.02(H) 0.96 - 0.91   BP/Weight 10/14/2016 07/23/2016 06/26/2016 04/18/2016 04/01/2016 60/08/7701 4/0/3524  Systolic BP 818 590 931 121 624 469 507  Diastolic BP 70 68 70 70 70 74 74  Wt. (Lbs) 133 136.12 135 136.8 134.12 135.12 135  BMI 25.13 25.72 25.51 25.85 25.34 25.53 25.51   Foot/eye exam completion dates Latest Ref Rng & Units 07/23/2016 02/02/2015  Eye Exam No Retinopathy - -  Foot Form Completion - Done Done

## 2016-10-27 NOTE — Assessment & Plan Note (Signed)
The patient's Controlled Substance registry is reviewed and compliance confirmed. Adequacy of  Pain control and level of function is assessed. Medication dosing is adjusted as deemed appropriate. Twelve weeks of medication is prescribed , patient signs for the script and is provided with a follow up appointment between 11 to 12 weeks .  

## 2016-10-27 NOTE — Assessment & Plan Note (Signed)
Controlled, no change in medication  

## 2016-10-27 NOTE — Assessment & Plan Note (Signed)
Hyperlipidemia:Low fat diet discussed and encouraged.   Lipid Panel  Lab Results  Component Value Date   CHOL 303 (H) 10/14/2016   HDL 45 (L) 10/14/2016   LDLCALC 192 (H) 10/14/2016   LDLDIRECT 166 (H) 10/11/2009   TRIG 330 (H) 10/14/2016   CHOLHDL 6.7 (H) 10/14/2016    Uncontrolled needs to reduce fried and fatty foods and take meds prescribed

## 2016-10-29 ENCOUNTER — Encounter: Payer: Self-pay | Admitting: Family Medicine

## 2016-11-20 ENCOUNTER — Other Ambulatory Visit: Payer: Self-pay | Admitting: Family Medicine

## 2016-11-20 NOTE — Telephone Encounter (Signed)
Patient called regarding her Prescriptions, patient states pharmacy does not have them, patient states she cannot remember which ones. Please advise [phone]

## 2016-12-10 ENCOUNTER — Other Ambulatory Visit: Payer: Self-pay | Admitting: Family Medicine

## 2016-12-11 ENCOUNTER — Ambulatory Visit (INDEPENDENT_AMBULATORY_CARE_PROVIDER_SITE_OTHER): Payer: PPO

## 2016-12-11 VITALS — BP 142/70 | HR 76 | Temp 98.7°F | Ht 61.0 in | Wt 134.1 lb

## 2016-12-11 DIAGNOSIS — E1151 Type 2 diabetes mellitus with diabetic peripheral angiopathy without gangrene: Secondary | ICD-10-CM | POA: Diagnosis not present

## 2016-12-11 DIAGNOSIS — F32A Depression, unspecified: Secondary | ICD-10-CM

## 2016-12-11 DIAGNOSIS — F329 Major depressive disorder, single episode, unspecified: Secondary | ICD-10-CM | POA: Diagnosis not present

## 2016-12-11 DIAGNOSIS — N952 Postmenopausal atrophic vaginitis: Secondary | ICD-10-CM

## 2016-12-11 DIAGNOSIS — Z1231 Encounter for screening mammogram for malignant neoplasm of breast: Secondary | ICD-10-CM | POA: Diagnosis not present

## 2016-12-11 DIAGNOSIS — Z1239 Encounter for other screening for malignant neoplasm of breast: Secondary | ICD-10-CM

## 2016-12-11 DIAGNOSIS — Z135 Encounter for screening for eye and ear disorders: Secondary | ICD-10-CM

## 2016-12-11 DIAGNOSIS — Z0000 Encounter for general adult medical examination without abnormal findings: Secondary | ICD-10-CM | POA: Diagnosis not present

## 2016-12-11 DIAGNOSIS — Z23 Encounter for immunization: Secondary | ICD-10-CM

## 2016-12-11 MED ORDER — ESTRADIOL 10 MCG VA TABS: 1.0000 | ORAL_TABLET | VAGINAL | 1 refills | Status: DC

## 2016-12-11 MED ORDER — ESTROGENS, CONJUGATED 0.625 MG/GM VA CREA: 1.0000 | TOPICAL_CREAM | VAGINAL | 2 refills | Status: DC

## 2016-12-11 MED ORDER — FLUOXETINE HCL 10 MG PO TABS: 10.0000 mg | ORAL_TABLET | Freq: Every day | ORAL | 2 refills | Status: DC

## 2016-12-11 NOTE — Progress Notes (Signed)
Subjective:   Carla [name] is a 71 y.o. female who presents for Medicare Annual (Subsequent) preventive examination.  Review of Systems:  Cardiac Risk Factors include: advanced age (>45men, >56 women);diabetes mellitus;dyslipidemia;hypertension;sedentary lifestyle     Objective:     Vitals: BP (!) 142/70   Pulse 76   Temp 98.7 F (37.1 C) (Temporal)   Ht 5\' 1"  (1.549 m)   Wt 134 lb 1.9 oz (60.8 kg)   BMI 25.34 kg/m   Body mass index is 25.34 kg/m.   Tobacco History  Smoking Status  . Former Smoker  . Packs/day: 3.00  . Years: 50.00  . Types: Cigarettes  . Quit date: 12/12/2010  Smokeless Tobacco  . Never Used     Counseling given: Not Answered   Past Medical History:  Diagnosis Date  . ASCVD (arteriosclerotic cardiovascular disease)    No critical disease on cath in 8/97: mild AI with trival, if any l AS; negative stress nuclear in 2010  . Back pain   . Cervical disc herniation   . Chronic bronchitis   . Colon polyps   . Depression   . Elevated hemoglobin A1c   . Fasting hyperglycemia   . GERD (gastroesophageal reflux disease)   . HSV (herpes simplex virus) infection   . Hydronephrosis   . Hypertension   . Osteoporosis   . Peripheral vascular disease ([place])    stauts post aortabifemoral graft    . Secondary erythrocytosis 08/05/2012   Secondary to COPD  . Tobacco abuse    has stopped   Past Surgical History:  Procedure Laterality Date  . ABDOMINAL HYSTERECTOMY    . aorta bifem graft    . CHOLECYSTECTOMY    . COLONOSCOPY  06/23/2006   Dr. Fields:Normal colon without evidence of polyps, masses, inflammatory changes/Normal retroflex view of the rectum  . COLONOSCOPY N/A 08/24/2013   Procedure: COLONOSCOPY;  Surgeon: Danie Binder, MD;  Location: [facility];  Service: Endoscopy;  Laterality: N/A;  10:30-moved to [place] notified pt  . lysis of adhesions  2000  . PARTIAL HYSTERECTOMY    . right kidney surgery following damage during  arterial surgery    . TOTAL ABDOMINAL HYSTERECTOMY W/ BILATERAL SALPINGOOPHORECTOMY  1975   Family History  Problem Relation Age of Onset  . Heart attack Father   . COPD Sister   . Heart disease Sister   . Diabetes Sister   . Coronary artery disease Sister   . Liver cancer Sister 13  . Diabetes Sister   . Diabetes Sister   . COPD Sister   . Colon cancer Neg Hx    History  Sexual Activity  . Sexual activity: Yes  . Birth control/ protection: Surgical    Outpatient Encounter Prescriptions as of 12/11/2016  Medication Sig  . alendronate (FOSAMAX) 70 MG tablet TAKE 1 TABLET EACH WEEK 30 MIN BEFORE BREAKFAST WITH 8 OUNCES OF WATER FOR OSTEOPOROSIS.  Marland Kitchen ALPRAZolam (XANAX) 0.5 MG tablet TAKE ONE TABLET BY MOUTH TWICE DAILY.  Marland Kitchen amLODipine (NORVASC) 10 MG tablet TAKE (1) TABLET BY MOUTH DAILY.  Marland Kitchen aspirin EC 81 MG tablet Take 81 mg by mouth daily.  Marland Kitchen atorvastatin (LIPITOR) 40 MG tablet TAKE 1 TABLET BY MOUTH ONCE DAILY.  Marland Kitchen HYDROcodone-acetaminophen (NORCO) 10-325 MG tablet One tablet twice daily  . Multiple Vitamin (MULTIVITAMIN WITH MINERALS) TABS Take 1 tablet by mouth every evening.   . Omega-3 Fatty Acids (FISH OIL PO) Take 1 capsule by mouth daily.  . [  DISCONTINUED] Ginkgo Biloba (GINKOBA PO) Take 2 tablets by mouth at bedtime.  Marland Kitchen acyclovir (ZOVIRAX) 400 MG tablet Take 1 tablet (400 mg total) by mouth 3 (three) times daily. (Patient not taking: Reported on 12/11/2016)  . acyclovir (ZOVIRAX) 800 MG tablet TAKE ONE TABLET BY MOUTH 2 TIMES DAILY. (Patient not taking: Reported on 12/11/2016)  . conjugated estrogens (PREMARIN) vaginal cream Place 1 Applicatorful vaginally 3 (three) times a week. For vaginal thinning and irritation  . [START ON 12/12/2016] Estradiol 10 MCG TABS vaginal tablet Place 1 tablet (10 mcg total) vaginally 2 (two) times a week.  Marland Kitchen FLUoxetine (PROZAC) 10 MG tablet Take 1 tablet (10 mg total) by mouth daily.  . [DISCONTINUED] conjugated estrogens (PREMARIN) vaginal cream  Place 1 Applicatorful vaginally 3 (three) times a week. For vaginal thinning and irritation (Patient not taking: Reported on 12/11/2016)  . [DISCONTINUED] Estradiol 10 MCG TABS vaginal tablet Place 1 tablet (10 mcg total) vaginally 2 (two) times a week. (Patient not taking: Reported on 12/11/2016)  . [DISCONTINUED] FLUoxetine (PROZAC) 10 MG tablet Take 1 tablet (10 mg total) by mouth daily. (Patient not taking: Reported on 12/11/2016)  . [DISCONTINUED] hydrocortisone 2.5 % cream APPLY SPARINGLY TO VAGINAL AREA TWICE WEEKLY AS NEEDED FOR ITCHING.  . [DISCONTINUED] montelukast (SINGULAIR) 10 MG tablet TAKE (1) TABLET BY MOUTH AT BEDTIME.   No facility-administered encounter medications on file as of 12/11/2016.     Activities of Daily Living In your present state of health, do you have any difficulty performing the following activities: 12/11/2016 02/29/2016  Hearing? N N  Vision? Y N  Comment Recommend yearly eye exams -  Difficulty concentrating or making decisions? N Y  Walking or climbing stairs? N N  Dressing or bathing? N N  Doing errands, shopping? N N  Preparing Food and eating ? N N  Using the Toilet? N N  In the past six months, have you accidently leaked urine? N N  Do you have problems with loss of bowel control? N N  Managing your Medications? N N  Managing your Finances? N N  Housekeeping or managing your Housekeeping? N N  Some recent data might be hidden    Patient Care Team: Fayrene Helper, MD as PCP - General Danie Binder, MD as Consulting Physician (Gastroenterology)    Assessment:    Exercise Activities and Dietary recommendations Current Exercise Habits: The patient does not participate in regular exercise at present, Exercise limited by: None identified  Goals    . Exercise 3x per week (30 min per time)          Recommend starting a routine exercise program at least 3 days a week for 30-45 minutes at a time as tolerated.        Fall Risk Fall Risk   12/11/2016 02/29/2016 10/30/2015 09/27/2015 06/15/2014  Falls in the past year? No No No No No  Number falls in past yr: - - - - -  Injury with Fall? - - - - -   Depression Screen PHQ 2/9 Scores 12/11/2016 04/01/2016 02/29/2016 10/30/2015  PHQ - 2 Score 1 6 0 3  PHQ- 9 Score 2 17 - 12     Cognitive Function: Normal MMSE - Mini Mental State Exam 10/30/2015 07/06/2015  Orientation to time 4 5  Orientation to Place 5 5  Registration 3 3  Attention/ Calculation 5 5  Recall 0 0  Language- name 2 objects 2 2  Language- repeat 1 1  Language-  follow 3 step command 3 3  Language- read & follow direction 1 1  Write a sentence 1 1  Copy design 1 0  Total score 26 26     6CIT Screen 12/11/2016  What Year? 0 points  What month? 0 points  What time? 0 points  Count back from 20 0 points  Months in reverse 0 points  Repeat phrase 0 points  Total Score 0    Immunization History  Administered Date(s) Administered  . Influenza Split 03/11/2011, 01/23/2012  . Influenza Whole 01/23/2007, 12/28/2008, 01/17/2010  . Influenza,inj,Quad PF,6+ Mos 01/05/2013, 02/10/2014, 12/15/2014, 12/11/2016  . Pneumococcal Conjugate-13 04/20/2014  . Pneumococcal Polysaccharide-23 11/19/2010  . Td 04/05/2003  . Tdap 12/22/2013  . Zoster 11/19/2010   Screening Tests Health Maintenance  Topic Date Due  . OPHTHALMOLOGY EXAM  04/29/2015  . HEMOGLOBIN A1C  04/16/2017  . MAMMOGRAM  07/06/2017  . FOOT EXAM  07/23/2017  . URINE MICROALBUMIN  10/14/2017  . COLONOSCOPY  08/25/2023  . TETANUS/TDAP  12/23/2023  . INFLUENZA VACCINE  Completed  . DEXA SCAN  Completed  . Hepatitis C Screening  Completed  . PNA vac Low Risk Adult  Completed      Plan:   I have personally reviewed and noted the following in the patient's chart:   . Medical and social history . Use of alcohol, tobacco or illicit drugs  . Current medications and supplements . Functional ability and status . Nutritional status . Physical  activity . Advanced directives . List of other physicians . Hospitalizations, surgeries, and ER visits in previous 12 months . Vitals . Screenings to include cognitive, depression, and falls . Referrals and appointments: Mammogram ordered today, referral sent in for diabetic eye exam and glaucoma screening. Community resource referral also sent in today.   In addition, I have reviewed and discussed with patient certain preventive protocols, quality metrics, and best practice recommendations. A written personalized care plan for preventive services as well as general preventive health recommendations were provided to patient.     Stormy Fabian, LPN  3/32/[id number]

## 2016-12-11 NOTE — Patient Instructions (Addendum)
Carla [name] , Thank you for taking time to come for your Medicare Wellness Visit. I appreciate your ongoing commitment to your health goals. Please review the following plan we discussed and let me know if I can assist you in the future.   Screening recommendations/referrals: Colonoscopy: Up to date and no longer required Mammogram: Due, ordered today. We will call and schedule this for you. Bone Density: Up to date Diabetic Eye Exam: Due, referral sent today Recommended yearly dental visit for hygiene and checkup  Vaccinations: Influenza vaccine: Administered today Pneumococcal vaccine: Up to date Tdap vaccine: Up to date, next due 11/2023 Shingles vaccine: Done    Advanced directives: Advance directive discussed with you today. I have provided a copy for you to complete at home and have notarized. Once this is complete please bring a copy in to our office so we can scan it into your chart.  Conditions/risks identified: Pre-obese, recommend starting a routine exercise program at least 3 days a week for 30-45 minutes at a time as tolerated.   Next appointment: Follow up with Dr. Moshe Cipro on 01/23/2017 at 11:00 am. Follow up in 1 year for your annual wellness visit.   Preventive Care 39 Years and Older, Female Preventive care refers to lifestyle choices and visits with your health care provider that can promote health and wellness. What does preventive care include?  A yearly physical exam. This is also called an annual well check.  Dental exams once or twice a year.  Routine eye exams. Ask your health care provider how often you should have your eyes checked.  Personal lifestyle choices, including:  Daily care of your teeth and gums.  Regular physical activity.  Eating a healthy diet.  Avoiding tobacco and drug use.  Limiting alcohol use.  Practicing safe sex.  Taking low-dose aspirin every day.  Taking vitamin and mineral supplements as recommended by your health care  provider. What happens during an annual well check? The services and screenings done by your health care provider during your annual well check will depend on your age, overall health, lifestyle risk factors, and family history of disease. Counseling  Your health care provider may ask you questions about your:  Alcohol use.  Tobacco use.  Drug use.  Emotional well-being.  Home and relationship well-being.  Sexual activity.  Eating habits.  History of falls.  Memory and ability to understand (cognition).  Work and work Statistician.  Reproductive health. Screening  You may have the following tests or measurements:  Height, weight, and BMI.  Blood pressure.  Lipid and cholesterol levels. These may be checked every 5 years, or more frequently if you are over 61 years old.  Skin check.  Lung cancer screening. You may have this screening every year starting at age 55 if you have a 30-pack-year history of smoking and currently smoke or have quit within the past 15 years.  Fecal occult blood test (FOBT) of the stool. You may have this test every year starting at age 42.  Flexible sigmoidoscopy or colonoscopy. You may have a sigmoidoscopy every 5 years or a colonoscopy every 10 years starting at age 20.  Hepatitis C blood test.  Hepatitis B blood test.  Sexually transmitted disease (STD) testing.  Diabetes screening. This is done by checking your blood sugar (glucose) after you have not eaten for a while (fasting). You may have this done every 1-3 years.  Bone density scan. This is done to screen for osteoporosis. You may have this  done starting at age 37.  Mammogram. This may be done every 1-2 years. Talk to your health care provider about how often you should have regular mammograms. Talk with your health care provider about your test results, treatment options, and if necessary, the need for more tests. Vaccines  Your health care provider may recommend certain  vaccines, such as:  Influenza vaccine. This is recommended every year.  Tetanus, diphtheria, and acellular pertussis (Tdap, Td) vaccine. You may need a Td booster every 10 years.  Zoster vaccine. You may need this after age 67.  Pneumococcal 13-valent conjugate (PCV13) vaccine. One dose is recommended after age 52.  Pneumococcal polysaccharide (PPSV23) vaccine. One dose is recommended after age 37. Talk to your health care provider about which screenings and vaccines you need and how often you need them. This information is not intended to replace advice given to you by your health care provider. Make sure you discuss any questions you have with your health care provider. Document Released: 04/07/2015 Document Revised: 11/29/2015 Document Reviewed: 01/10/2015 Elsevier Interactive Patient Education  2017 [employer] Prevention in the Home Falls can cause injuries. They can happen to people of all ages. There are many things you can do to make your home safe and to help prevent falls. What can I do on the outside of my home?  Regularly fix the edges of walkways and driveways and fix any cracks.  Remove anything that might make you trip as you walk through a door, such as a raised step or threshold.  Trim any bushes or trees on the path to your home.  Use bright outdoor lighting.  Clear any walking paths of anything that might make someone trip, such as rocks or tools.  Regularly check to see if handrails are loose or broken. Make sure that both sides of any steps have handrails.  Any raised decks and porches should have guardrails on the edges.  Have any leaves, snow, or ice cleared regularly.  Use sand or salt on walking paths during winter.  Clean up any spills in your garage right away. This includes oil or grease spills. What can I do in the bathroom?  Use night lights.  Install grab bars by the toilet and in the tub and shower. Do not use towel bars as grab  bars.  Use non-skid mats or decals in the tub or shower.  If you need to sit down in the shower, use a plastic, non-slip stool.  Keep the floor dry. Clean up any water that spills on the floor as soon as it happens.  Remove soap buildup in the tub or shower regularly.  Attach bath mats securely with double-sided non-slip rug tape.  Do not have throw rugs and other things on the floor that can make you trip. What can I do in the bedroom?  Use night lights.  Make sure that you have a light by your bed that is easy to reach.  Do not use any sheets or blankets that are too big for your bed. They should not hang down onto the floor.  Have a firm chair that has side arms. You can use this for support while you get dressed.  Do not have throw rugs and other things on the floor that can make you trip. What can I do in the kitchen?  Clean up any spills right away.  Avoid walking on wet floors.  Keep items that you use a lot in easy-to-reach places.  If you need to reach something above you, use a strong step stool that has a grab bar.  Keep electrical cords out of the way.  Do not use floor polish or wax that makes floors slippery. If you must use wax, use non-skid floor wax.  Do not have throw rugs and other things on the floor that can make you trip. What can I do with my stairs?  Do not leave any items on the stairs.  Make sure that there are handrails on both sides of the stairs and use them. Fix handrails that are broken or loose. Make sure that handrails are as long as the stairways.  Check any carpeting to make sure that it is firmly attached to the stairs. Fix any carpet that is loose or worn.  Avoid having throw rugs at the top or bottom of the stairs. If you do have throw rugs, attach them to the floor with carpet tape.  Make sure that you have a light switch at the top of the stairs and the bottom of the stairs. If you do not have them, ask someone to add them for  you. What else can I do to help prevent falls?  Wear shoes that:  Do not have high heels.  Have rubber bottoms.  Are comfortable and fit you well.  Are closed at the toe. Do not wear sandals.  If you use a stepladder:  Make sure that it is fully opened. Do not climb a closed stepladder.  Make sure that both sides of the stepladder are locked into place.  Ask someone to hold it for you, if possible.  Clearly mark and make sure that you can see:  Any grab bars or handrails.  First and last steps.  Where the edge of each step is.  Use tools that help you move around (mobility aids) if they are needed. These include:  Canes.  Walkers.  Scooters.  Crutches.  Turn on the lights when you go into a dark area. Replace any light bulbs as soon as they burn out.  Set up your furniture so you have a clear path. Avoid moving your furniture around.  If any of your floors are uneven, fix them.  If there are any pets around you, be aware of where they are.  Review your medicines with your doctor. Some medicines can make you feel dizzy. This can increase your chance of falling. Ask your doctor what other things that you can do to help prevent falls. This information is not intended to replace advice given to you by your health care provider. Make sure you discuss any questions you have with your health care provider. Document Released: 01/05/2009 Document Revised: 08/17/2015 Document Reviewed: 04/15/2014 Elsevier Interactive Patient Education  2017 [employer].

## 2016-12-13 ENCOUNTER — Other Ambulatory Visit: Payer: Self-pay | Admitting: Family Medicine

## 2016-12-23 ENCOUNTER — Other Ambulatory Visit: Payer: Self-pay | Admitting: Family Medicine

## 2016-12-25 NOTE — Telephone Encounter (Signed)
Seen [phone] 

## 2017-01-01 ENCOUNTER — Other Ambulatory Visit: Payer: Self-pay | Admitting: Family Medicine

## 2017-01-01 ENCOUNTER — Other Ambulatory Visit: Payer: Self-pay

## 2017-01-01 MED ORDER — ALPRAZOLAM 0.5 MG PO TABS: 0.5000 mg | ORAL_TABLET | Freq: Two times a day (BID) | ORAL | 0 refills | Status: DC

## 2017-01-06 ENCOUNTER — Other Ambulatory Visit: Payer: Self-pay

## 2017-01-06 MED ORDER — ALPRAZOLAM 0.5 MG PO TABS: 0.5000 mg | ORAL_TABLET | Freq: Two times a day (BID) | ORAL | 0 refills | Status: DC

## 2017-01-07 ENCOUNTER — Telehealth: Payer: Self-pay

## 2017-01-07 NOTE — Telephone Encounter (Signed)
Left vm for pt to discuss community resource referral. Pt can reach me at number below or will try again this week.   Josepha Pigg, B.A.  Care Guide [phone]

## 2017-01-08 ENCOUNTER — Other Ambulatory Visit: Payer: Self-pay

## 2017-01-09 ENCOUNTER — Other Ambulatory Visit: Payer: Self-pay | Admitting: Family Medicine

## 2017-01-14 ENCOUNTER — Telehealth: Payer: Self-pay

## 2017-01-14 NOTE — Telephone Encounter (Signed)
Telephone outreach to patient to give her community resource information for free and affordable eye glasses and exams.    Josepha Pigg, B.A.  Care Guide [phone]

## 2017-01-23 ENCOUNTER — Encounter: Payer: Self-pay | Admitting: Family Medicine

## 2017-01-23 ENCOUNTER — Ambulatory Visit (INDEPENDENT_AMBULATORY_CARE_PROVIDER_SITE_OTHER): Payer: PPO | Admitting: Family Medicine

## 2017-01-23 VITALS — BP 118/78 | HR 74 | Resp 16 | Ht 61.0 in | Wt 135.0 lb

## 2017-01-23 DIAGNOSIS — M810 Age-related osteoporosis without current pathological fracture: Secondary | ICD-10-CM | POA: Diagnosis not present

## 2017-01-23 DIAGNOSIS — I10 Essential (primary) hypertension: Secondary | ICD-10-CM

## 2017-01-23 DIAGNOSIS — E785 Hyperlipidemia, unspecified: Secondary | ICD-10-CM

## 2017-01-23 DIAGNOSIS — G3184 Mild cognitive impairment, so stated: Secondary | ICD-10-CM | POA: Diagnosis not present

## 2017-01-23 DIAGNOSIS — E1151 Type 2 diabetes mellitus with diabetic peripheral angiopathy without gangrene: Secondary | ICD-10-CM | POA: Diagnosis not present

## 2017-01-23 DIAGNOSIS — G8929 Other chronic pain: Secondary | ICD-10-CM

## 2017-01-23 DIAGNOSIS — M549 Dorsalgia, unspecified: Secondary | ICD-10-CM | POA: Diagnosis not present

## 2017-01-23 MED ORDER — HYDROCODONE-ACETAMINOPHEN 10-325 MG PO TABS: ORAL_TABLET | ORAL | 0 refills | Status: DC

## 2017-01-23 MED ORDER — ATORVASTATIN CALCIUM 80 MG PO TABS: 80.0000 mg | ORAL_TABLET | Freq: Every day | ORAL | 3 refills | Status: DC

## 2017-01-23 NOTE — Patient Instructions (Addendum)
F/yu in 12 weeks, call if you need me sooner  Please get mammogram done before next appoinrtment , overdue  No change in pain sand anxiety medication   Start atorvastatin 80 mg one at bedtime and reduce fried and fatty foods  Fasting lipid, cmp and EGFr and hBa1C 1 week before next visit  Thank you  for choosing [facility]. We consider it a privelige to serve you.  Delivering excellent health care in a caring and  compassionate way is our goal.  Partnering with you,  so that together we can achieve this goal is our strategy.

## 2017-01-25 ENCOUNTER — Other Ambulatory Visit: Payer: Self-pay | Admitting: Family Medicine

## 2017-01-27 ENCOUNTER — Telehealth: Payer: Self-pay

## 2017-01-27 DIAGNOSIS — G3184 Mild cognitive impairment, so stated: Secondary | ICD-10-CM | POA: Insufficient documentation

## 2017-01-27 MED ORDER — DONEPEZIL HCL 5 MG PO TABS: 5.0000 mg | ORAL_TABLET | Freq: Every day | ORAL | 2 refills | Status: DC

## 2017-01-27 NOTE — Assessment & Plan Note (Signed)
Carla [name] is reminded of the importance of commitment to daily physical activity for 30 minutes or more, as able and the need to limit carbohydrate intake to 30 to 60 grams per meal to help with blood sugar control.  Updated lab needed at/ before next visit.  Carla [name] is reminded of the importance of daily foot exam, annual eye examination, and good blood sugar, blood pressure and cholesterol control.  Diabetic Labs Latest Ref Rng & Units 10/14/2016 07/23/2016 09/27/2015 07/11/2015 05/31/2015  HbA1c <5.7 % 6.0(H) 6.1(H) - - 6.5(H)  Microalbumin Not Estab. ug/mL 12.4(H) - - 25.9 -  Micro/Creat Ratio 0.0 - 30.0 mg/g creat 13.1 - - 48(H) -  Chol <200 mg/dL 303(H) 196 - - 136  HDL >50 mg/dL 45(L) 49(L) - - 41  Calc LDL <100 mg/dL 192(H) 97 - - 42  Triglycerides <150 mg/dL 330(H) 249(H) - - 263(H)  Creatinine 0.60 - 0.93 mg/dL 0.98(H) 1.02(H) 0.96 - 0.91   BP/Weight 01/23/2017 12/11/2016 10/14/2016 07/23/2016 06/26/2016 06/18/6145 0/11/2955  Systolic BP 473 403 709 643 838 184 037  Diastolic BP 78 70 70 68 70 70 70  Wt. (Lbs) 135 134.12 133 136.12 135 136.8 134.12  BMI 25.51 25.34 25.13 25.72 25.51 25.85 25.34   Foot/eye exam completion dates Latest Ref Rng & Units 07/23/2016 02/02/2015  Eye Exam No Retinopathy - -  Foot Form Completion - Done Done

## 2017-01-27 NOTE — Telephone Encounter (Signed)
Pls let her know that is have sent in aricept ( generic) 5 mg one at bedtime for memory , since her score shows mild impairment

## 2017-01-27 NOTE — Assessment & Plan Note (Signed)
Controlled, no change in medication DASH diet and commitment to daily physical activity for a minimum of 30 minutes discussed and encouraged, as a part of hypertension management. The importance of attaining a healthy weight is also discussed.  BP/Weight 01/23/2017 12/11/2016 10/14/2016 07/23/2016 06/26/2016 07/27/1362 05/30/3777  Systolic BP 396 886 484 720 [phone]  Diastolic BP 78 70 70 68 70 70 70  Wt. (Lbs) 135 134.12 133 136.12 135 136.8 134.12  BMI 25.51 25.34 25.13 25.72 25.51 25.85 25.34

## 2017-01-27 NOTE — Assessment & Plan Note (Signed)
The patient's Controlled Substance registry is reviewed and compliance confirmed. Adequacy of  Pain control and level of function is assessed. Medication dosing is adjusted as deemed appropriate. Twelve weeks of medication is prescribed , patient signs for the script and is provided with a follow up appointment between 11 to 12 weeks .  

## 2017-01-27 NOTE — Assessment & Plan Note (Signed)
Continue weekly fosamax and regular weight bearing activity

## 2017-01-27 NOTE — Assessment & Plan Note (Signed)
Pt to start aricept

## 2017-01-27 NOTE — Progress Notes (Signed)
Carla [name]     MRN: [id number]      DOB: Sep 17, 1945   HPI Carla [name] is here for follow up and re-evaluation of chronic medical conditions, medication management in particular chronic pain management and review of any available recent lab and radiology data.  Preventive health is updated, specifically  Cancer screening and Immunization.   The PT denies any adverse reactions to current medications since the last visit.  Concerned about memory loss and called back after the visit to be started on medication for this C/o anxiety and stress, requesting increased dose of medication for anxiety which is denied esp in light of memory loss  ROS Denies recent fever or chills. Denies sinus pressure, nasal congestion, ear pain or sore throat. Denies chest congestion, productive cough or wheezing. Denies chest pains, palpitations and leg swelling Denies abdominal pain, nausea, vomiting,diarrhea or constipation.   Denies dysuria, frequency, hesitancy or incontinence. Denies uncontrolled  joint pain, swelling and limitation in mobility. Denies headaches, seizures, numbness, or tingling. Denies depression,  C/o anxiety denies  insomnia. Denies skin break down or rash.   PE  BP 118/78   Pulse 74   Resp 16   Ht 5\' 1"  (1.549 m)   Wt 135 lb (61.2 kg)   SpO2 95%   BMI 25.51 kg/m   Patient alert and oriented and in no cardiopulmonary distress.  HEENT: No facial asymmetry, EOMI,   oropharynx pink and moist.  Neck supple no JVD, no mass.  Chest: Clear to auscultation bilaterally.  CVS: S1, S2 no murmurs, no S3.Regular rate.  ABD: Soft non tender.   Ext: No edema  MS: Adequate ROM spine, shoulders, hips and knees.  Skin: Intact, no ulcerations or rash noted.  Psych: Good eye contact, normal affect. Memory intact not anxious or depressed appearing.  CNS: CN 2-12 intact, power,  normal throughout.no focal deficits noted.   Assessment & Plan  Essential  hypertension Controlled, no change in medication DASH diet and commitment to daily physical activity for a minimum of 30 minutes discussed and encouraged, as a part of hypertension management. The importance of attaining a healthy weight is also discussed.  BP/Weight 01/23/2017 12/11/2016 10/14/2016 07/23/2016 06/26/2016 9/83/3825 0/07/3974  Systolic BP 734 193 790 240 973 532 992  Diastolic BP 78 70 70 68 70 70 70  Wt. (Lbs) 135 134.12 133 136.12 135 136.8 134.12  BMI 25.51 25.34 25.13 25.72 25.51 25.85 25.34       Encounter for chronic pain management The patient's Controlled Substance registry is reviewed and compliance confirmed. Adequacy of  Pain control and level of function is assessed. Medication dosing is adjusted as deemed appropriate. Twelve weeks of medication is prescribed , patient signs for the script and is provided with a follow up appointment between 11 to 12 weeks .   Hyperlipidemia LDL goal <100 Uncontrolled Hyperlipidemia:Low fat diet discussed and encouraged.   Lipid Panel  Lab Results  Component Value Date   CHOL 303 (H) 10/14/2016   HDL 45 (L) 10/14/2016   LDLCALC 192 (H) 10/14/2016   LDLDIRECT 166 (H) 10/11/2009   TRIG 330 (H) 10/14/2016   CHOLHDL 6.7 (H) 10/14/2016     Increase lipitor to 80 mg   Osteoporosis Continue weekly fosamax and regular weight bearing activity  Well controlled type 2 diabetes mellitus with peripheral circulatory disorder ([place]) Carla [name] is reminded of the importance of commitment to daily physical activity for 30 minutes or more, as able and the need  to limit carbohydrate intake to 30 to 60 grams per meal to help with blood sugar control.  Updated lab needed at/ before next visit.  Carla [name] is reminded of the importance of daily foot exam, annual eye examination, and good blood sugar, blood pressure and cholesterol control.  Diabetic Labs Latest Ref Rng & Units 10/14/2016 07/23/2016 09/27/2015 07/11/2015 05/31/2015  HbA1c  <5.7 % 6.0(H) 6.1(H) - - 6.5(H)  Microalbumin Not Estab. ug/mL 12.4(H) - - 25.9 -  Micro/Creat Ratio 0.0 - 30.0 mg/g creat 13.1 - - 48(H) -  Chol <200 mg/dL 303(H) 196 - - 136  HDL >50 mg/dL 45(L) 49(L) - - 41  Calc LDL <100 mg/dL 192(H) 97 - - 42  Triglycerides <150 mg/dL 330(H) 249(H) - - 263(H)  Creatinine 0.60 - 0.93 mg/dL 0.98(H) 1.02(H) 0.96 - 0.91   BP/Weight 01/23/2017 12/11/2016 10/14/2016 07/23/2016 06/26/2016 09/25/5007 05/31/1827  Systolic BP 937 169 678 938 101 751 025  Diastolic BP 78 70 70 68 70 70 70  Wt. (Lbs) 135 134.12 133 136.12 135 136.8 134.12  BMI 25.51 25.34 25.13 25.72 25.51 25.85 25.34   Foot/eye exam completion dates Latest Ref Rng & Units 07/23/2016 02/02/2015  Eye Exam No Retinopathy - -  Foot Form Completion - Done Done        MCI (mild cognitive impairment) with memory loss Pt to start aricept

## 2017-01-27 NOTE — Telephone Encounter (Signed)
MMSE was done at last visit and patient is requesting med for memory loss.

## 2017-01-27 NOTE — Assessment & Plan Note (Signed)
Uncontrolled Hyperlipidemia:Low fat diet discussed and encouraged.   Lipid Panel  Lab Results  Component Value Date   CHOL 303 (H) 10/14/2016   HDL 45 (L) 10/14/2016   LDLCALC 192 (H) 10/14/2016   LDLDIRECT 166 (H) 10/11/2009   TRIG 330 (H) 10/14/2016   CHOLHDL 6.7 (H) 10/14/2016     Increase lipitor to 80 mg

## 2017-01-28 NOTE — Telephone Encounter (Signed)
Patient notified

## 2017-01-29 NOTE — Telephone Encounter (Signed)
Seen 04/04/16

## 2017-01-30 ENCOUNTER — Telehealth: Payer: Self-pay | Admitting: *Deleted

## 2017-01-30 NOTE — Telephone Encounter (Signed)
Patient called and left 3 messages on voicemail stating the medication Dr Moshe Cipro has put her on makes her poop on herself before getting up in the mornings and she cannot get to the bathroom fast enough. Patient stated it is a medication for her memory. Please call patient [phone]  I called patient, patient states the medicine really works and helps her.

## 2017-01-31 NOTE — Telephone Encounter (Signed)
Wants to know if this is a side effect of med and if it will go away or if she should stop it

## 2017-01-31 NOTE — Telephone Encounter (Signed)
Patient aware.

## 2017-01-31 NOTE — Telephone Encounter (Signed)
Advise her to add benefiber to her food to see if this will help with the problem for the next 3 to 5 days, if not then call back , I will try to get the topical form of the medication , aricept patch Yes this may be a s/e but try to work with it and it should get better

## 2017-02-03 ENCOUNTER — Other Ambulatory Visit: Payer: Self-pay | Admitting: Family Medicine

## 2017-02-04 ENCOUNTER — Other Ambulatory Visit: Payer: Self-pay

## 2017-02-04 MED ORDER — ALPRAZOLAM 0.5 MG PO TABS: 0.5000 mg | ORAL_TABLET | Freq: Two times a day (BID) | ORAL | 3 refills | Status: DC

## 2017-02-17 DIAGNOSIS — E1151 Type 2 diabetes mellitus with diabetic peripheral angiopathy without gangrene: Secondary | ICD-10-CM | POA: Diagnosis not present

## 2017-02-17 DIAGNOSIS — E785 Hyperlipidemia, unspecified: Secondary | ICD-10-CM | POA: Diagnosis not present

## 2017-02-18 ENCOUNTER — Other Ambulatory Visit: Payer: Self-pay | Admitting: Family Medicine

## 2017-02-18 LAB — COMPLETE METABOLIC PANEL WITH GFR
AG RATIO: 1.4 (calc) (ref 1.0–2.5)
ALBUMIN MSPROF: 4.3 g/dL (ref 3.6–5.1)
ALKALINE PHOSPHATASE (APISO): 116 U/L (ref 33–130)
ALT: 19 U/L (ref 6–29)
AST: 21 U/L (ref 10–35)
BUN: 10 mg/dL (ref 7–25)
CHLORIDE: 103 mmol/L (ref 98–110)
CO2: 29 mmol/L (ref 20–32)
Calcium: 9.9 mg/dL (ref 8.6–10.4)
Creat: 0.68 mg/dL (ref 0.60–0.93)
GFR, Est African American: 102 mL/min/{1.73_m2} (ref >=60)
GFR, Est Non African American: 88 mL/min/{1.73_m2} (ref >=60)
GLUCOSE: 110 mg/dL — AB (ref 65–99)
Globulin: 3 g/dL (calc) (ref 1.9–3.7)
POTASSIUM: 3.7 mmol/L (ref 3.5–5.3)
SODIUM: 138 mmol/L (ref 135–146)
Total Bilirubin: 0.5 mg/dL (ref 0.2–1.2)
Total Protein: 7.3 g/dL (ref 6.1–8.1)

## 2017-02-18 LAB — HEMOGLOBIN A1C
Hgb A1c MFr Bld: 6.1 % of total Hgb — ABNORMAL HIGH (ref <5.7)
Mean Plasma Glucose: 128 (calc)
eAG (mmol/L): 7.1 (calc)

## 2017-02-18 LAB — LIPID PANEL
Cholesterol: 171 mg/dL (ref <200)
HDL: 58 mg/dL (ref >50)
LDL CHOLESTEROL (CALC): 86 mg/dL
Non-HDL Cholesterol (Calc): 113 mg/dL (calc) (ref <130)
TRIGLYCERIDES: 172 mg/dL — AB (ref <150)
Total CHOL/HDL Ratio: 2.9 (calc) (ref <5.0)

## 2017-03-19 ENCOUNTER — Other Ambulatory Visit: Payer: Self-pay | Admitting: Family Medicine

## 2017-03-24 ENCOUNTER — Other Ambulatory Visit: Payer: Self-pay | Admitting: Family Medicine

## 2017-03-31 ENCOUNTER — Telehealth: Payer: Self-pay | Admitting: Family Medicine

## 2017-03-31 ENCOUNTER — Other Ambulatory Visit: Payer: Self-pay | Admitting: Family Medicine

## 2017-03-31 DIAGNOSIS — M549 Dorsalgia, unspecified: Secondary | ICD-10-CM

## 2017-03-31 NOTE — Telephone Encounter (Signed)
Patient is requesting medication for ear ache, stopped up so bad she cant hear [employer]

## 2017-04-01 ENCOUNTER — Telehealth: Payer: Self-pay | Admitting: Family Medicine

## 2017-04-01 ENCOUNTER — Other Ambulatory Visit: Payer: Self-pay | Admitting: Family Medicine

## 2017-04-01 DIAGNOSIS — H6123 Impacted cerumen, bilateral: Secondary | ICD-10-CM

## 2017-04-01 DIAGNOSIS — H9190 Unspecified hearing loss, unspecified ear: Secondary | ICD-10-CM

## 2017-04-01 NOTE — Telephone Encounter (Signed)
States she can hear popping noises every time she swallows and the right one feels a little sore and maybe fluid in it. Wants to know what you recommend she get for it or if something can be sent in for it

## 2017-04-01 NOTE — Telephone Encounter (Signed)
Message left for patient

## 2017-04-01 NOTE — Telephone Encounter (Signed)
Please explain she may use oTC wax softener and try to flush her ears with oTC product NOT q tip, since states can't hear , if this not successful, I am willing to refer her to eNT for ear irrigation and hearing eval, please k let me know if she wants to go I will enter referral to Dr Benjamine Mola, locally

## 2017-04-01 NOTE — Telephone Encounter (Signed)
Noted and referred

## 2017-04-01 NOTE — Telephone Encounter (Signed)
Patient states she has tried OTC products. Wishes to see Teoh.

## 2017-04-01 NOTE — Telephone Encounter (Signed)
I have referred her to Dr Benjamine Mola for evaluation

## 2017-04-01 NOTE — Telephone Encounter (Signed)
Ears are popping everytime she swallows  Both ears.. And the right one is more sore, can you call her in something or does... You have tried Prexoside and sweet oil and nothing is helping. Has not been sick, other than nose runny...[phone]

## 2017-04-08 ENCOUNTER — Telehealth: Payer: Self-pay | Admitting: Family Medicine

## 2017-04-08 NOTE — Telephone Encounter (Signed)
Patient cant be seen with Dr.Teoh's office until 04/17/17, she is requesting an prescription for help with the cough and popping in her ears. Pharmacy: [facility]

## 2017-04-09 ENCOUNTER — Other Ambulatory Visit: Payer: Self-pay | Admitting: Family Medicine

## 2017-04-09 MED ORDER — AZELASTINE HCL 0.1 % NA SOLN: 2.0000 | Freq: Two times a day (BID) | NASAL | 1 refills | Status: DC

## 2017-04-09 MED ORDER — PREDNISONE 5 MG PO TABS: ORAL_TABLET | ORAL | 0 refills | Status: DC

## 2017-04-09 NOTE — Telephone Encounter (Signed)
I recommend and will send in if she agrees astellin nose spray to help decongest sinus and therefore the feeling of fullness in her ears, she NEEDS to take the singulair every day, and for the cough this will; help reduce the drainage and cough also. 5 days of prednisone twice daily will help both as wellI am sending both in

## 2017-04-09 NOTE — Telephone Encounter (Signed)
Called and left message that meds were sent in

## 2017-04-09 NOTE — Progress Notes (Signed)
Excess allergy symptoms

## 2017-04-14 ENCOUNTER — Encounter: Payer: Self-pay | Admitting: Family Medicine

## 2017-04-14 ENCOUNTER — Ambulatory Visit (INDEPENDENT_AMBULATORY_CARE_PROVIDER_SITE_OTHER): Payer: PPO | Admitting: Family Medicine

## 2017-04-14 VITALS — BP 140/80 | HR 76 | Resp 16 | Ht 61.0 in | Wt 130.0 lb

## 2017-04-14 DIAGNOSIS — E785 Hyperlipidemia, unspecified: Secondary | ICD-10-CM | POA: Diagnosis not present

## 2017-04-14 DIAGNOSIS — M549 Dorsalgia, unspecified: Secondary | ICD-10-CM

## 2017-04-14 DIAGNOSIS — F411 Generalized anxiety disorder: Secondary | ICD-10-CM

## 2017-04-14 DIAGNOSIS — J302 Other seasonal allergic rhinitis: Secondary | ICD-10-CM | POA: Diagnosis not present

## 2017-04-14 DIAGNOSIS — G8929 Other chronic pain: Secondary | ICD-10-CM | POA: Diagnosis not present

## 2017-04-14 DIAGNOSIS — H9190 Unspecified hearing loss, unspecified ear: Secondary | ICD-10-CM | POA: Diagnosis not present

## 2017-04-14 MED ORDER — HYDROCODONE-ACETAMINOPHEN 10-325 MG PO TABS: ORAL_TABLET | ORAL | 0 refills | Status: DC

## 2017-04-14 MED ORDER — BENZONATATE 100 MG PO CAPS: 100.0000 mg | ORAL_CAPSULE | Freq: Two times a day (BID) | ORAL | 0 refills | Status: DC | PRN

## 2017-04-14 MED ORDER — MONTELUKAST SODIUM 10 MG PO TABS: 10.0000 mg | ORAL_TABLET | Freq: Every day | ORAL | 3 refills | Status: DC

## 2017-04-14 NOTE — Progress Notes (Signed)
Fill Date ID Written Drug Qty Days Prescriber Rx # Pharmacy Refill Daily Dose * Pymt Type PMP  04/01/2017  1   02/04/2017  Alprazolam 0.5 MG Tablet  60 30 Ma Sim  [id number] Car ([id number])  2 2.00 LME  Comm Ins  [place]  03/31/2017  1   01/23/2017  Hydrocodone-Acetamin 10-325 MG  60 30 Ma Sim  [id number] Car ([id number])  0 20.00 MME  Comm Ins  [place]  03/04/2017  1   02/04/2017  Alprazolam 0.5 MG Tablet  60 30 Ma Sim  [id number] Car ([id number])  1 2.00 LME  Comm Ins  [place]  02/25/2017  1   01/23/2017  Hydrocodone-Acetamin 10-325 MG  60 30 Ma Sim  [id number] Car ([id number])  0 20.00 MME  Comm Ins  [place]  02/04/2017  1   02/04/2017  Alprazolam 0.5 MG Tablet  60 30 Ma Sim  [id number] Car ([id number])  0 2.00 LME  Comm Ins  [place]  01/26/2017  1   01/23/2017  Hydrocodone-Acetamin 10-325 MG  60 30 Ma Sim  [id number] Car ([id number])  0 20.00 MME  Comm Ins  [place]  01/06/2017  1   01/06/2017  Alprazolam 0.5 MG Tablet  60 30 Ma Sim  [id number] Car ([id number])  0 2.00 LME  Comm Ins    12/27/2016  1   10/14/2016  Hydrocodone-Acetamin 10-325 MG  60 30 Ma Sim  [id number] Car ([id number])  0 20.00 MME  Comm Ins    11/27/2016  1   10/14/2016  Hydrocodone-Acetamin 10-325 MG  60           Reviewed and unchanged

## 2017-04-14 NOTE — Patient Instructions (Signed)
F/u week of March 18, call if you need me sooner  You need to get mammogram scheduled at checkout and get it done also   Pls fill tessalon perle, singulair and nose spray for ear  Fullness, allergies and cough and keep appt with Dr Benjamine Mola for ear evaluation and hearing loss  All the best for 2019!  Pls bring all meds to next visit

## 2017-04-17 ENCOUNTER — Ambulatory Visit (INDEPENDENT_AMBULATORY_CARE_PROVIDER_SITE_OTHER): Payer: PPO | Admitting: Otolaryngology

## 2017-04-17 DIAGNOSIS — H903 Sensorineural hearing loss, bilateral: Secondary | ICD-10-CM

## 2017-04-17 DIAGNOSIS — H6983 Other specified disorders of Eustachian tube, bilateral: Secondary | ICD-10-CM

## 2017-04-21 ENCOUNTER — Encounter: Payer: Self-pay | Admitting: Family Medicine

## 2017-04-21 NOTE — Assessment & Plan Note (Signed)
Unchanged and adequately controlled on current regime 

## 2017-04-21 NOTE — Assessment & Plan Note (Signed)
Unchanged, no change in medication

## 2017-04-21 NOTE — Assessment & Plan Note (Signed)
Hyperlipidemia:Low fat diet discussed and encouraged.   Lipid Panel  Lab Results  Component Value Date   CHOL 171 02/17/2017   HDL 58 02/17/2017   LDLCALC 192 (H) 10/14/2016   LDLDIRECT 166 (H) 10/11/2009   TRIG 172 (H) 02/17/2017   CHOLHDL 2.9 02/17/2017   Uncontrolled Updated lab needed and is past due

## 2017-04-21 NOTE — Assessment & Plan Note (Signed)
Uncontrolled, needs o be committed to medication to improve ear fullness and discomfort as well as allergy symptoms, educated and medication prescribed

## 2017-04-21 NOTE — Assessment & Plan Note (Signed)
The patient's Controlled Substance registry is reviewed and compliance confirmed. Adequacy of  Pain control and level of function is assessed. Medication dosing is adjusted as deemed appropriate. Twelve weeks of medication is prescribed , patient signs for the script and is provided with a follow up appointment between 11 to 12 weeks .  

## 2017-04-21 NOTE — Progress Notes (Signed)
   Carla [name]     MRN: [id number]      DOB: 05/27/1945   HPI Carla [name] is here for follow up and re-evaluation of chronic medical conditions, medication management specifically for chronic pain, and and review of any available recent lab and radiology data.  Preventive health is updated, specifically  Cancer screening and Immunization.   She has an upcoming ENT appt which she needs to keep, as she continues to c/o hearing loss and fullness in her ears, she definitely does have hearing loss The PT denies any adverse reactions to current medications since the last visit.  C/o anxiety and stress, as tussal tries to request increase in xanax,but quickly back off stating that she has to have her pain medication  ROS Denies recent fever or chills. Denies sinus pressure, nasal congestion, sore throat. Denies chest congestion, productive cough or wheezing. Denies chest pains, palpitations and leg swelling Denies abdominal pain, nausea, vomiting,diarrhea or constipation.   Denies dysuria, frequency, hesitancy or incontinence. Denies uncontrolled  joint pain, swelling and limitation in mobility. Denies headaches, seizures, numbness, or tingling. Denies depression,  C/o anxiety and mild  insomnia. Denies skin break down or rash.   PE  BP 140/80   Pulse 76   Resp 16   Ht 5\' 1"  (1.549 m)   Wt 130 lb (59 kg)   SpO2 93%   BMI 24.56 kg/m   Patient alert and oriented and in no cardiopulmonary distress.  HEENT: No facial asymmetry, EOMI,   oropharynx pink and moist.  Neck supple no JVD, no mass.TM dull, no cerumen, fsir light reflex  Chest: Clear to auscultation bilaterally.Decreased air entry CVS: S1, S2 no murmurs, no S3.Regular rate.  ABD: Soft non tender.   Ext: No edema  MS: Adequate ROM spine, shoulders, hips and knees.  Skin: Intact, no ulcerations or rash noted.  Psych: Good eye contact, normal affect. Memory intact not anxious or depressed appearing.  CNS: CN 2-12  intact, power,  normal throughout.no focal deficits noted.   Assessment & Plan Encounter for chronic pain management The patient's Controlled Substance registry is reviewed and compliance confirmed. Adequacy of  Pain control and level of function is assessed. Medication dosing is adjusted as deemed appropriate. Twelve weeks of medication is prescribed , patient signs for the script and is provided with a follow up appointment between 11 to 12 weeks .   Hearing loss Importance of ENT re evaluation is stressed  Hyperlipidemia LDL goal <100 Hyperlipidemia:Low fat diet discussed and encouraged.   Lipid Panel  Lab Results  Component Value Date   CHOL 171 02/17/2017   HDL 58 02/17/2017   LDLCALC 192 (H) 10/14/2016   LDLDIRECT 166 (H) 10/11/2009   TRIG 172 (H) 02/17/2017   CHOLHDL 2.9 02/17/2017   Uncontrolled Updated lab needed and is past due   Back pain with radiation Unchanged and adequately controlled on current regime  GAD (generalized anxiety disorder) Unchanged, no change in medication  Seasonal allergies Uncontrolled, needs o be committed to medication to improve ear fullness and discomfort as well as allergy symptoms, educated and medication prescribed

## 2017-04-21 NOTE — Assessment & Plan Note (Signed)
Importance of ENT re evaluation is stressed

## 2017-05-02 ENCOUNTER — Other Ambulatory Visit: Payer: Self-pay | Admitting: Family Medicine

## 2017-05-13 ENCOUNTER — Other Ambulatory Visit: Payer: Self-pay | Admitting: Family Medicine

## 2017-05-29 ENCOUNTER — Ambulatory Visit (INDEPENDENT_AMBULATORY_CARE_PROVIDER_SITE_OTHER): Payer: PPO | Admitting: Otolaryngology

## 2017-05-31 ENCOUNTER — Other Ambulatory Visit: Payer: Self-pay | Admitting: Family Medicine

## 2017-06-10 ENCOUNTER — Ambulatory Visit: Payer: PPO | Admitting: Family Medicine

## 2017-06-12 ENCOUNTER — Encounter: Payer: Self-pay | Admitting: Family Medicine

## 2017-06-12 ENCOUNTER — Telehealth: Payer: Self-pay

## 2017-06-12 ENCOUNTER — Other Ambulatory Visit: Payer: Self-pay

## 2017-06-12 ENCOUNTER — Ambulatory Visit (INDEPENDENT_AMBULATORY_CARE_PROVIDER_SITE_OTHER): Payer: PPO | Admitting: Family Medicine

## 2017-06-12 VITALS — BP 126/50 | HR 81 | Temp 98.1°F | Resp 12 | Ht 62.0 in | Wt 130.1 lb

## 2017-06-12 DIAGNOSIS — G8929 Other chronic pain: Secondary | ICD-10-CM

## 2017-06-12 DIAGNOSIS — E785 Hyperlipidemia, unspecified: Secondary | ICD-10-CM

## 2017-06-12 DIAGNOSIS — M549 Dorsalgia, unspecified: Secondary | ICD-10-CM | POA: Diagnosis not present

## 2017-06-12 DIAGNOSIS — F329 Major depressive disorder, single episode, unspecified: Secondary | ICD-10-CM

## 2017-06-12 DIAGNOSIS — I10 Essential (primary) hypertension: Secondary | ICD-10-CM

## 2017-06-12 DIAGNOSIS — F32A Depression, unspecified: Secondary | ICD-10-CM

## 2017-06-12 MED ORDER — HYDROCODONE-ACETAMINOPHEN 10-325 MG PO TABS: ORAL_TABLET | ORAL | 0 refills | Status: DC

## 2017-06-12 MED ORDER — ALPRAZOLAM 0.5 MG PO TABS: 0.5000 mg | ORAL_TABLET | Freq: Two times a day (BID) | ORAL | 4 refills | Status: DC

## 2017-06-12 NOTE — Patient Instructions (Addendum)
F/u as f/u in  12 to 13 weeks, call if you need me before  No change in medication however definitely start therapy with Ava, and DO keep visiting family   [facility]

## 2017-06-12 NOTE — Progress Notes (Signed)
Fill Date ID Written Drug Qty Days Prescriber Rx # Pharmacy Refill Daily Dose * Pymt Type PMP  06/02/2017  1  06/02/2017  Alprazolam 0.5 Mg Tablet  60 30 Ma Sim  [id number] Car ([id number])  0 2.00 LME Comm Ins  [place]  05/29/2017  1  04/14/2017  Hydrocodone-Acetamin 10-325 Mg  60 30 Ma Sim  [id number] Car ([id number])  0 20.00 MME Comm Ins  [place]  05/01/2017  1  04/14/2017  Hydrocodone-Acetamin 10-325 Mg  60 30 Ma Sim  [id number] Car ([id number])  0 20.00 MME Comm Ins  [place]  05/01/2017  1  02/04/2017  Alprazolam 0.5 Mg Tablet  60 30 Ma Sim  [id number] Car ([id number])  3 2.00 LME Comm Ins  [place]  04/01/2017  1  02/04/2017  Alprazolam 0.5 Mg Tablet  60 30 Ma Sim  [id number] Car ([id number])  2 2.00 LME Comm Ins  [place]  03/31/2017  1  01/23/2017  Hydrocodone-Acetamin 10-325 Mg  60 30 Ma Sim  [id number] Car ([id number])  0 20.00 MME Comm Ins  [place]  03/04/2017  1  02/04/2017  Alprazolam 0.5 Mg Tablet  60 30 Ma Sim  [id number] Car ([id number])  1 2.00 LME Comm Ins  [place]  02/25/2017  1  01/23/2017  Hydrocodone-Acetamin 10-325 Mg  60 30 Ma Sim  [id number] Car ([id number])  0 20.00 MME Comm Ins  [place]  02/04/2017  1  02/04/2017  Alprazolam 0.5 Mg Tablet  60 30 Ma Sim  [id number] Car ([id number])  0 2.00 LME Comm Ins  [place]  01/26/2017  1  01/23/2017  Hydrocodone-Acetamin 10-325 Mg  60 30 Ma Sim  [id number] Car ([id number])  0 20.00 MME Comm Ins  [place]  01/06/2017  1  01/06/2017  Alprazolam 0.5 Mg Tablet  60 30 Ma Sim  [id number] Car ([id number])  0 2.00 LME Comm Ins  [place]  12/27/2016  1  10/14/2016  Hydrocodone-Acetamin 10-325 Mg            Reviewed at visit

## 2017-06-12 NOTE — Telephone Encounter (Signed)
[facility] - left message.  

## 2017-06-15 ENCOUNTER — Encounter: Payer: Self-pay | Admitting: Family Medicine

## 2017-06-15 NOTE — Assessment & Plan Note (Signed)
The patient's Controlled Substance registry is reviewed and compliance confirmed. Adequacy of  Pain control and level of function is assessed. Medication dosing is adjusted as deemed appropriate. Twelve weeks of medication is prescribed , patient signs for the script and is provided with a follow up appointment between 11 to 12 weeks .  

## 2017-06-15 NOTE — Assessment & Plan Note (Signed)
Hyperlipidemia:Low fat diet discussed and encouraged.   Lipid Panel  Lab Results  Component Value Date   CHOL 171 02/17/2017   HDL 58 02/17/2017   LDLCALC 86 02/17/2017   LDLDIRECT 166 (H) 10/11/2009   TRIG 172 (H) 02/17/2017   CHOLHDL 2.9 02/17/2017   Updated lab needed at/ before next visit. Not at goal

## 2017-06-15 NOTE — Assessment & Plan Note (Signed)
Controlled, no change in medication DASH diet and commitment to daily physical activity for a minimum of 30 minutes discussed and encouraged, as a part of hypertension management. The importance of attaining a healthy weight is also discussed.  BP/Weight 06/12/2017 04/14/2017 01/23/2017 12/11/2016 10/14/2016 0/03/1001 07/02/6114  Systolic BP 435 391 225 834 621 947 125  Diastolic BP 50 80 78 70 70 68 70  Wt. (Lbs) 130.12 130 135 134.12 133 136.12 135  BMI 23.8 24.56 25.51 25.34 25.13 25.72 25.51

## 2017-06-15 NOTE — Progress Notes (Signed)
Carla [name]     MRN: [id number]      DOB: January 31, 1946   HPI Carla [name] is here for follow up and re-evaluation of chronic medical conditions and  medication managementThe PT denies any adverse reactions to current medications since the last visit.  Tearful and extremely anxious at visit, was initially angry , states that her life home is awful. Spouse will go for an entire day without speaking to her after 37 years of marriage. This is painful for her. She  says that she absolutely enjoyed her day yesterday with her grandchildren , thought nothing of pain , depression or anxiety. Initially , she was requesting dose increases in both pain medication and anxiety medication, and after speaking with me for a while she herself concluded that her real problem was her relationship with her husband and that she will benefit from therapy  I also encouraged her to develop a life of her own where she creates her own happiness outside of home instead of waiting on her husbands to provide this as no one should  She states she has never thought of things this way and that it is a good idea I believe and am hopeful that she will do well  ROS Denies recent fever or chills. Denies sinus pressure, nasal congestion, ear pain or sore throat. Denies chest congestion, productive cough or wheezing. Denies chest pains, palpitations and leg swelling Denies abdominal pain, nausea, vomiting,diarrhea or constipation.   Denies dysuria, frequency, hesitancy or incontinence. Chronic  Back pain with mild limitation in mobility. Denies headaches, seizures, c./o lower extremity  numbness, and  tingling. . Denies skin break down or rash.   PE  BP (!) 126/50   Pulse 81   Temp 98.1 F (36.7 C)   Resp 12   Ht 5\' 2"  (1.575 m)   Wt 130 lb 1.9 oz (59 kg)   SpO2 96%   BMI 23.80 kg/m   Patient alert and oriented and in no cardiopulmonary distress.Tearful, and anxious,  HEENT: No facial asymmetry, EOMI,    oropharynx pink and moist.  Neck supple no JVD, no mass.  Chest: Clear to auscultation bilaterally.  CVS: S1, S2 no murmurs, no S3.Regular rate.  ABD: Soft non tender.   Ext: No edema  MS: Adequate ROM spine, shoulders, hips and knees.  Skin: Intact, no ulcerations or rash noted.  Psych: Good eye contact, . Memory intact  CNS: CN 2-12 intact, power,  normal throughout.no focal deficits noted.   Assessment & Plan  Encounter for chronic pain management The patient's Controlled Substance registry is reviewed and compliance confirmed. Adequacy of  Pain control and level of function is assessed. Medication dosing is adjusted as deemed appropriate. Twelve weeks of medication is prescribed , patient signs for the script and is provided with a follow up appointment between 11 to 12 weeks .   Essential hypertension Controlled, no change in medication DASH diet and commitment to daily physical activity for a minimum of 30 minutes discussed and encouraged, as a part of hypertension management. The importance of attaining a healthy weight is also discussed.  BP/Weight 06/12/2017 04/14/2017 01/23/2017 12/11/2016 10/14/2016 10/24/4233 05/28/1441  Systolic BP 154 008 676 195 093 267 124  Diastolic BP 50 80 78 70 70 68 70  Wt. (Lbs) 130.12 130 135 134.12 133 136.12 135  BMI 23.8 24.56 25.51 25.34 25.13 25.72 25.51       Hyperlipidemia LDL goal <100 Hyperlipidemia:Low fat diet discussed and encouraged.  Lipid Panel  Lab Results  Component Value Date   CHOL 171 02/17/2017   HDL 58 02/17/2017   LDLCALC 86 02/17/2017   LDLDIRECT 166 (H) 10/11/2009   TRIG 172 (H) 02/17/2017   CHOLHDL 2.9 02/17/2017   Updated lab needed at/ before next visit. Not at goal

## 2017-06-18 ENCOUNTER — Other Ambulatory Visit: Payer: Self-pay | Admitting: Family Medicine

## 2017-06-18 ENCOUNTER — Ambulatory Visit (HOSPITAL_COMMUNITY): Payer: PPO

## 2017-06-24 ENCOUNTER — Telehealth: Payer: Self-pay

## 2017-06-24 DIAGNOSIS — F32A Depression, unspecified: Secondary | ICD-10-CM

## 2017-06-24 DIAGNOSIS — F329 Major depressive disorder, single episode, unspecified: Secondary | ICD-10-CM

## 2017-06-24 NOTE — BH Specialist Note (Signed)
[place] Initial Clinical Assessment  MRN: [id number] NAME: Carla [name] Date: 06/24/17  Total time: 30 minutes  Type of Contact: Type of Contact: Phone Call Initial Contact Patient consent obtained: Patient consent obtained for Virtual Visit: (NA) Reason for Visit today: Reason for Your Call/Visit Today: VBH Initial Assessment   Treatment History Patient recently received Inpatient Treatment: Have You Recently Been in Any Inpatient Treatment (Hospital/Detox/Crisis Center/28-Day Program)?: No  Facility/Program:   NA  Date of discharge:  NA  Patient currently being seen by therapist/psychiatrist: Do You Currently Have a Therapist/Psychiatrist?: No Patient currently receiving the following services: Patient Currently Receiving the Following Services:: (None Reported )  Past Psychiatric History/Hospitalization(s):NA Anxiety: Yes Bipolar Disorder: No Depression: Yes Mania: No Psychosis: No Schizophrenia: No Personality Disorder: No Hospitalization for psychiatric illness: No History of Electroconvulsive Shock Therapy: No Prior Suicide Attempts: No  Clinical Assessment:  PHQ-9 Assessments: Depression screen [facility] 2/9 06/24/2017 06/12/2017 06/12/2017 12/11/2016 04/01/2016  Decreased Interest 3 1 0 0 3  Down, Depressed, Hopeless 3 2 0 1 3  PHQ - 2 Score 6 3 0 1 6  Altered sleeping 2 0 - 0 0  Tired, decreased energy 1 0 - 0 0  Change in appetite 0 2 - 0 2  Feeling bad or failure about yourself  1 2 - 1 3  Trouble concentrating 0 3 - 0 3  Moving slowly or fidgety/restless 0 2 - 0 3  Suicidal thoughts 0 0 - 0 0  PHQ-9 Score 10 12 - 2 17  Difficult doing work/chores - - - Not difficult at all -  Some recent data might be hidden     GAD-7 Assessments: GAD 7 : Generalized Anxiety Score 06/24/2017  Nervous, Anxious, on Edge 2  Control/stop worrying 2  Worry too much - different things 1  Trouble relaxing 1  Restless 0  Easily annoyed or irritable 1  Afraid - awful  might happen 0  Total GAD 7 Score 7       Social Functioning Social maturity: Social Maturity: Responsible Social judgement: Social Judgement: Normal  Stress Current stressors:  Is not communicating well with her husband, pt feels as if he is not speaking to her.  Her son died 5 years ago and she still has unresolved feelings of grief.  Familial stressors:  None reorted Sleep:  Not sleeping at night, wakes up frequently Appetite:  Denies any issues Coping ability:  Overwhelmed Patient taking medications as prescribed:  Yes - does not think that the medication is strong enough.   Current medications:  Outpatient Encounter Medications as of 06/24/2017  Medication Sig  . acyclovir (ZOVIRAX) 400 MG tablet Take 1 tablet (400 mg total) by mouth 3 (three) times daily. (Patient not taking: Reported on 06/12/2017)  . acyclovir (ZOVIRAX) 800 MG tablet TAKE ONE TABLET BY MOUTH 2 TIMES DAILY.  Marland Kitchen alendronate (FOSAMAX) 70 MG tablet TAKE 1 TABLET EACH WEEK 30 MIN BEFORE BREAKFAST WITH 8 OUNCES OF WATER FOR OSTEOPOROSIS.  Marland Kitchen ALPRAZolam (XANAX) 0.5 MG tablet Take 1 tablet (0.5 mg total) by mouth 2 (two) times daily.  Marland Kitchen amLODipine (NORVASC) 10 MG tablet TAKE ONE TABLET BY MOUTH ONCE DAILY.  Marland Kitchen aspirin EC 81 MG tablet Take 81 mg by mouth daily.  Marland Kitchen atorvastatin (LIPITOR) 80 MG tablet Take 1 tablet (80 mg total) by mouth daily.  Marland Kitchen azelastine (ASTELIN) 0.1 % nasal spray Place 2 sprays into both nostrils 2 (two) times daily. Use in each nostril as  directed (Patient not taking: Reported on 06/12/2017)  . benzonatate (TESSALON) 100 MG capsule Take 1 capsule (100 mg total) by mouth 2 (two) times daily as needed for cough. (Patient not taking: Reported on 06/12/2017)  . conjugated estrogens (PREMARIN) vaginal cream Place 1 Applicatorful vaginally 3 (three) times a week. For vaginal thinning and irritation (Patient not taking: Reported on 06/12/2017)  . donepezil (ARICEPT) 5 MG tablet TAKE (1) TABLET BY MOUTH AT BEDTIME.   . Estradiol 10 MCG TABS vaginal tablet Place 1 tablet (10 mcg total) vaginally 2 (two) times a week. (Patient not taking: Reported on 06/12/2017)  . FLUoxetine (PROZAC) 10 MG tablet Take 1 tablet (10 mg total) by mouth daily.  Marland Kitchen gabapentin (NEURONTIN) 100 MG capsule TAKE (2) CAPSULES BY MOUTH AT BEDTIME.  Marland Kitchen HYDROcodone-acetaminophen (NORCO) 10-325 MG tablet One tablet twice daily  . montelukast (SINGULAIR) 10 MG tablet Take 1 tablet (10 mg total) by mouth at bedtime.  . Multiple Vitamin (MULTIVITAMIN WITH MINERALS) TABS Take 1 tablet by mouth every evening.   . Omega-3 Fatty Acids (FISH OIL PO) Take 1 capsule by mouth daily.  . predniSONE (DELTASONE) 5 MG tablet One tablet twice daily for 5 days (Patient not taking: Reported on 06/12/2017)   No facility-administered encounter medications on file as of 06/24/2017.     Self-harm Behaviors Risk Assessment Self-harm risk factors:  None Reported  Patient endorses recent thoughts of harming self:  No   Danger to Others Risk Assessment Danger to others risk factors:  No Patient endorses recent thoughts of harming others:  No     Goals, Interventions and Follow-up Plan Goals: Increase healthy adjustment to current life circumstances Interventions: Motivational Interviewing, Supportive Counseling and Sleep Hygiene Follow-up Plan: VBH Phone Follow Up   Summary of Clinical Assessment Summary:   Patient is a 72 year old married female that reports increased depression and anxiety due to her husband not speaking to her.  Patient reports that her husband, "is a drinker" but he has significantly decreased the amount he drinks in recent years.  Patient reports that she has been married for 53 years.   Patient reports  unresoved feelings of grief associated with the death of her son five years ago. Patient reports that she needs an increase in her pain and anxiety medication because she is not able to sleep at night.  Patient reports that her medication  is working; however,it just needs to be stronger.    Patient denies a history of mania.  Patient denies a decreased need for sleep.  Patient denies euphoria.  Patient denies reports a past suicide attempt.  Patient denies any past or present self-injurious behaviors.  Patient denies a family history of mental illness.  Patient denies a family history of substance abuse.  Patient denies substance abuse.  Patient denies a family history of suicide. Patient denies DUI.    Patient reports that she enjoys spending time with her grand children. 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        Graciella Freer [place], LCAS-A

## 2017-06-25 ENCOUNTER — Encounter (HOSPITAL_COMMUNITY): Payer: Self-pay | Admitting: Psychiatry

## 2017-06-26 ENCOUNTER — Telehealth: Payer: Self-pay

## 2017-06-26 DIAGNOSIS — F329 Major depressive disorder, single episode, unspecified: Secondary | ICD-10-CM

## 2017-06-26 DIAGNOSIS — F32A Depression, unspecified: Secondary | ICD-10-CM

## 2017-06-26 NOTE — BH Specialist Note (Signed)
Virtual Behavioral Health Treatment Plan Team Note  MRN: [id number] NAME: Carla [name]  DATE: 06/26/17   Total time: 15 minutes Total number of Virtual [place] Treatment Team Plan encounters: 1/4  Treatment Team Attendees: Graciella Freer and Dr. Modesta Messing   Screenings PHQ-9 Assessments:  Depression screen [facility] 2/9 06/24/2017 06/12/2017 06/12/2017  Decreased Interest 3 1 0  Down, Depressed, Hopeless 3 2 0  PHQ - 2 Score 6 3 0  Altered sleeping 2 0 -  Tired, decreased energy 1 0 -  Change in appetite 0 2 -  Feeling bad or failure about yourself  1 2 -  Trouble concentrating 0 3 -  Moving slowly or fidgety/restless 0 2 -  Suicidal thoughts 0 0 -  PHQ-9 Score 10 12 -  Difficult doing work/chores - - -  Some recent data might be hidden   GAD-7 Assessments:  GAD 7 : Generalized Anxiety Score 06/24/2017  Nervous, Anxious, on Edge 2  Control/stop worrying 2  Worry too much - different things 1  Trouble relaxing 1  Restless 0  Easily annoyed or irritable 1  Afraid - awful might happen 0  Total GAD 7 Score 7    Presenting Problem/Current Symptoms: Depression   Diagnoses: No diagnosis found.  Psychiatric History  Past Psychiatric History/Hospitalization(s): Anxiety: Yes Bipolar Disorder: No Depression: NA Mania: No Psychosis: No Schizophrenia: No Personality Disorder: No Hospitalization for psychiatric illness: No History of Electroconvulsive Shock Therapy: No Prior Suicide Attempts: No   Stress     Self-harm Behaviors Risk Assessment    Allergies:  Allergies as of 06/26/2017 - Review Complete 06/15/2017  Allergen Reaction Noted  . Calcium-containing compounds Other (See Comments) 04/23/2014   Medication History Current medications:  Outpatient Encounter Medications as of 06/26/2017  Medication Sig  . acyclovir (ZOVIRAX) 400 MG tablet Take 1 tablet (400 mg total) by mouth 3 (three) times daily. (Patient not taking: Reported on 06/12/2017)  . acyclovir (ZOVIRAX) 800  MG tablet TAKE ONE TABLET BY MOUTH 2 TIMES DAILY.  Marland Kitchen alendronate (FOSAMAX) 70 MG tablet TAKE 1 TABLET EACH WEEK 30 MIN BEFORE BREAKFAST WITH 8 OUNCES OF WATER FOR OSTEOPOROSIS.  Marland Kitchen ALPRAZolam (XANAX) 0.5 MG tablet Take 1 tablet (0.5 mg total) by mouth 2 (two) times daily.  Marland Kitchen amLODipine (NORVASC) 10 MG tablet TAKE ONE TABLET BY MOUTH ONCE DAILY.  Marland Kitchen aspirin EC 81 MG tablet Take 81 mg by mouth daily.  Marland Kitchen atorvastatin (LIPITOR) 80 MG tablet Take 1 tablet (80 mg total) by mouth daily.  Marland Kitchen azelastine (ASTELIN) 0.1 % nasal spray Place 2 sprays into both nostrils 2 (two) times daily. Use in each nostril as directed (Patient not taking: Reported on 06/12/2017)  . benzonatate (TESSALON) 100 MG capsule Take 1 capsule (100 mg total) by mouth 2 (two) times daily as needed for cough. (Patient not taking: Reported on 06/12/2017)  . conjugated estrogens (PREMARIN) vaginal cream Place 1 Applicatorful vaginally 3 (three) times a week. For vaginal thinning and irritation (Patient not taking: Reported on 06/12/2017)  . donepezil (ARICEPT) 5 MG tablet TAKE (1) TABLET BY MOUTH AT BEDTIME.  . Estradiol 10 MCG TABS vaginal tablet Place 1 tablet (10 mcg total) vaginally 2 (two) times a week. (Patient not taking: Reported on 06/12/2017)  . FLUoxetine (PROZAC) 10 MG tablet Take 1 tablet (10 mg total) by mouth daily.  Marland Kitchen gabapentin (NEURONTIN) 100 MG capsule TAKE (2) CAPSULES BY MOUTH AT BEDTIME.  Marland Kitchen HYDROcodone-acetaminophen (NORCO) 10-325 MG tablet One tablet twice daily  .  montelukast (SINGULAIR) 10 MG tablet Take 1 tablet (10 mg total) by mouth at bedtime.  . Multiple Vitamin (MULTIVITAMIN WITH MINERALS) TABS Take 1 tablet by mouth every evening.   . Omega-3 Fatty Acids (FISH OIL PO) Take 1 capsule by mouth daily.  . predniSONE (DELTASONE) 5 MG tablet One tablet twice daily for 5 days (Patient not taking: Reported on 06/12/2017)   No facility-administered encounter medications on file as of 06/26/2017.     Psychotropic Medication  Management:   1. Medication: See Addendum   Indication:   Date started:   Date(s) changed:   Taking as prescribed:   Positive effects/relief of symptoms:   Negative side effects:  Medication Management Recommendations:  See Addendum   Goals, Interventions and Follow-up Plan Goals: Increase healthy adjustment to current life circumstances Interventions: Motivational Interviewing Supportive Counseling Sleep Hygiene Follow-up Plan: VBH Phone Follow Up   Scribe for Treatment Team: Rene Paci, LCAS-A

## 2017-06-26 NOTE — Telephone Encounter (Signed)
This encounter was created in error - please disregard.

## 2017-06-26 NOTE — Progress Notes (Signed)
Virtual behavioral Health Initiative ([place]) Psychiatric Consultant Case Review   Summary Carla [name] is a 72 y.o. year old female with history of depression, anxiety, MCI, hypertension, hyperlipidemia, ASCVD.  Complains of pain and anxiety in the setting of marriage discordance.   Functional Impairment: n/a Psychosocial factors: loss of her son five years ago, marriage discordance   Current Medications Current Outpatient Medications on File Prior to Visit  Medication Sig Dispense Refill  . acyclovir (ZOVIRAX) 400 MG tablet Take 1 tablet (400 mg total) by mouth 3 (three) times daily. (Patient not taking: Reported on 06/12/2017) 30 tablet 0  . acyclovir (ZOVIRAX) 800 MG tablet TAKE ONE TABLET BY MOUTH 2 TIMES DAILY. 60 tablet 0  . alendronate (FOSAMAX) 70 MG tablet TAKE 1 TABLET EACH WEEK 30 MIN BEFORE BREAKFAST WITH 8 OUNCES OF WATER FOR OSTEOPOROSIS. 4 tablet 4  . ALPRAZolam (XANAX) 0.5 MG tablet Take 1 tablet (0.5 mg total) by mouth 2 (two) times daily. 60 tablet 4  . amLODipine (NORVASC) 10 MG tablet TAKE ONE TABLET BY MOUTH ONCE DAILY. 90 tablet 0  . aspirin EC 81 MG tablet Take 81 mg by mouth daily.    Marland Kitchen atorvastatin (LIPITOR) 80 MG tablet Take 1 tablet (80 mg total) by mouth daily. 90 tablet 3  . azelastine (ASTELIN) 0.1 % nasal spray Place 2 sprays into both nostrils 2 (two) times daily. Use in each nostril as directed (Patient not taking: Reported on 06/12/2017) 30 mL 1  . benzonatate (TESSALON) 100 MG capsule Take 1 capsule (100 mg total) by mouth 2 (two) times daily as needed for cough. (Patient not taking: Reported on 06/12/2017) 14 capsule 0  . conjugated estrogens (PREMARIN) vaginal cream Place 1 Applicatorful vaginally 3 (three) times a week. For vaginal thinning and irritation (Patient not taking: Reported on 06/12/2017) 42.5 g 2  . donepezil (ARICEPT) 5 MG tablet TAKE (1) TABLET BY MOUTH AT BEDTIME. 30 tablet 0  . Estradiol 10 MCG TABS vaginal tablet Place 1 tablet (10 mcg  total) vaginally 2 (two) times a week. (Patient not taking: Reported on 06/12/2017) 90 tablet 1  . FLUoxetine (PROZAC) 10 MG tablet Take 1 tablet (10 mg total) by mouth daily. 90 tablet 2  . gabapentin (NEURONTIN) 100 MG capsule TAKE (2) CAPSULES BY MOUTH AT BEDTIME. 60 capsule 0  . HYDROcodone-acetaminophen (NORCO) 10-325 MG tablet One tablet twice daily 60 tablet 0  . montelukast (SINGULAIR) 10 MG tablet Take 1 tablet (10 mg total) by mouth at bedtime. 30 tablet 3  . Multiple Vitamin (MULTIVITAMIN WITH MINERALS) TABS Take 1 tablet by mouth every evening.     . Omega-3 Fatty Acids (FISH OIL PO) Take 1 capsule by mouth daily.    . predniSONE (DELTASONE) 5 MG tablet One tablet twice daily for 5 days (Patient not taking: Reported on 06/12/2017) 10 tablet 0   No current facility-administered medications on file prior to visit.      Past psychiatry history Outpatient: denies Psychiatry admission: denies Previous suicide attempt: denies Past trials of medication: fluoxetine, gabapentin History of violence: denies  Current measures Depression screen [facility] 2/9 06/24/2017 06/12/2017 06/12/2017 12/11/2016 04/01/2016  Decreased Interest 3 1 0 0 3  Down, Depressed, Hopeless 3 2 0 1 3  PHQ - 2 Score 6 3 0 1 6  Altered sleeping 2 0 - 0 0  Tired, decreased energy 1 0 - 0 0  Change in appetite 0 2 - 0 2  Feeling bad or failure about yourself  1 2 - 1 3  Trouble concentrating 0 3 - 0 3  Moving slowly or fidgety/restless 0 2 - 0 3  Suicidal thoughts 0 0 - 0 0  PHQ-9 Score 10 12 - 2 17  Difficult doing work/chores - - - Not difficult at all -  Some recent data might be hidden   GAD 7 : Generalized Anxiety Score 06/24/2017  Nervous, Anxious, on Edge 2  Control/stop worrying 2  Worry too much - different things 1  Trouble relaxing 1  Restless 0  Easily annoyed or irritable 1  Afraid - awful might happen 0  Total GAD 7 Score 7    Goals (patient centered) Increase healthy adjustment to current life  circumstances   Assessment/Provisional Diagnosis # Unspecified anxiety disorder # MDD She will benefit from supportive therapy/CBT to target anxiety and pain. May consider uptitration of gabapentin for pain and anxiety or fluoxetine for anxiety if any worsening in her symptoms.   Recommendation - Continue gabapentin 200 mg qhs. Consider uptitration to 300 mg qhs  - Continue fluoxetine 10 mg daily. Consider uptitration to 20 mg daily  - patient is on donepezil (MMSE is 23/30 in 01/23/2017 per chart)  - patient is on xanax  - [place] specialist to provide supportive therapy, behavioral activation  Thank you for your consult. We will continue to follow the patient. Please contact Jakin  for any questions or concerns.   The above treatment considerations and suggestions are based on consultation with the [facility] specialist and/or PCP and a review of information available in the shared registry and the patient's [place] Record (EHR). I have not personally examined the patient. All recommendations should be implemented with consideration of the patient's relevant prior history and current clinical status. Please feel free to call me with any questions about the care of this patient.

## 2017-07-07 ENCOUNTER — Other Ambulatory Visit: Payer: Self-pay | Admitting: Family Medicine

## 2017-07-10 ENCOUNTER — Telehealth: Payer: Self-pay | Admitting: Family Medicine

## 2017-07-10 NOTE — Telephone Encounter (Signed)
Memory pills are giving her diarrhea--can you call her

## 2017-07-14 NOTE — Telephone Encounter (Signed)
Wants her aricept changed becauses it causing diarrhea everytime she takes it. Please advise

## 2017-07-16 ENCOUNTER — Other Ambulatory Visit: Payer: Self-pay | Admitting: Family Medicine

## 2017-07-16 ENCOUNTER — Telehealth: Payer: Self-pay

## 2017-07-16 NOTE — Progress Notes (Unsigned)
aricept

## 2017-07-16 NOTE — Telephone Encounter (Signed)
VBH -  Left message  

## 2017-07-18 ENCOUNTER — Other Ambulatory Visit: Payer: Self-pay | Admitting: Family Medicine

## 2017-07-18 MED ORDER — RIVASTIGMINE 4.6 MG/24HR TD PT24: 4.6000 mg | MEDICATED_PATCH | Freq: Every day | TRANSDERMAL | 3 refills | Status: DC

## 2017-07-18 NOTE — Telephone Encounter (Signed)
Patient aware.

## 2017-07-18 NOTE — Telephone Encounter (Signed)
pls advise we may try exelon patch which works like aricept , I will send in for the lower dose only, and discontinue the aricept due to intolerance from diarrhea pls also tell her sorry about the delay in response

## 2017-07-21 ENCOUNTER — Other Ambulatory Visit: Payer: Self-pay | Admitting: Family Medicine

## 2017-07-27 ENCOUNTER — Other Ambulatory Visit: Payer: Self-pay | Admitting: Family Medicine

## 2017-08-18 ENCOUNTER — Other Ambulatory Visit: Payer: Self-pay | Admitting: Family Medicine

## 2017-08-25 ENCOUNTER — Other Ambulatory Visit: Payer: Self-pay | Admitting: Family Medicine

## 2017-08-29 ENCOUNTER — Telehealth: Payer: Self-pay | Admitting: Clinical

## 2017-08-29 NOTE — Telephone Encounter (Signed)
This BH intern left message to call back with name and contact information. ° °Sudheera Ranaweera  °Behavioral Health Intern  °

## 2017-09-04 ENCOUNTER — Ambulatory Visit: Payer: PPO | Admitting: Family Medicine

## 2017-09-08 ENCOUNTER — Telehealth: Payer: Self-pay | Admitting: Family Medicine

## 2017-09-08 NOTE — Telephone Encounter (Signed)
Tomorrow AM is the only time to work in

## 2017-09-08 NOTE — Telephone Encounter (Signed)
Patient states she has an "infection on her bottom" its hurting and burning. She is requesting an appt asap.

## 2017-09-08 NOTE — Telephone Encounter (Signed)
Please advise if you can work in the pt this week, --I didn't see anything --she missed her appt last week.

## 2017-09-09 NOTE — Telephone Encounter (Signed)
Worked in Architectural technologist

## 2017-09-09 NOTE — Telephone Encounter (Signed)
She couldn't make it today

## 2017-09-10 ENCOUNTER — Ambulatory Visit (INDEPENDENT_AMBULATORY_CARE_PROVIDER_SITE_OTHER): Payer: PPO | Admitting: Family Medicine

## 2017-09-10 ENCOUNTER — Encounter: Payer: Self-pay | Admitting: Family Medicine

## 2017-09-10 VITALS — BP 120/76 | HR 84 | Resp 16 | Ht 62.0 in | Wt 132.0 lb

## 2017-09-10 DIAGNOSIS — F411 Generalized anxiety disorder: Secondary | ICD-10-CM

## 2017-09-10 DIAGNOSIS — Z1231 Encounter for screening mammogram for malignant neoplasm of breast: Secondary | ICD-10-CM | POA: Diagnosis not present

## 2017-09-10 DIAGNOSIS — Z780 Asymptomatic menopausal state: Secondary | ICD-10-CM | POA: Diagnosis not present

## 2017-09-10 DIAGNOSIS — R300 Dysuria: Secondary | ICD-10-CM | POA: Diagnosis not present

## 2017-09-10 DIAGNOSIS — G8929 Other chronic pain: Secondary | ICD-10-CM | POA: Diagnosis not present

## 2017-09-10 DIAGNOSIS — N3001 Acute cystitis with hematuria: Secondary | ICD-10-CM | POA: Diagnosis not present

## 2017-09-10 DIAGNOSIS — E785 Hyperlipidemia, unspecified: Secondary | ICD-10-CM | POA: Diagnosis not present

## 2017-09-10 DIAGNOSIS — I10 Essential (primary) hypertension: Secondary | ICD-10-CM

## 2017-09-10 DIAGNOSIS — E559 Vitamin D deficiency, unspecified: Secondary | ICD-10-CM

## 2017-09-10 DIAGNOSIS — E1151 Type 2 diabetes mellitus with diabetic peripheral angiopathy without gangrene: Secondary | ICD-10-CM | POA: Diagnosis not present

## 2017-09-10 LAB — POCT URINALYSIS DIPSTICK
Appearance: NORMAL
Bilirubin, UA: NEGATIVE
GLUCOSE UA: NEGATIVE
KETONES UA: NEGATIVE
NITRITE UA: NEGATIVE
Odor: NORMAL
Protein, UA: NEGATIVE
SPEC GRAV UA: 1.025 (ref 1.010–1.025)
Urobilinogen, UA: 0.2 E.U./dL
pH, UA: 5.5 (ref 5.0–8.0)

## 2017-09-10 NOTE — Patient Instructions (Addendum)
Wellness visit in august  mD follow up in 12 weeks  All labs today  pass back when you have had labs drawn to get your mammogram and dexa appointents please  Thank you  for choosing [facility]. We consider it a privelige to serve you.  Delivering excellent health care in a caring and  compassionate way is our goal.  Partnering with you,  so that together we can achieve this goal is our strategy.

## 2017-09-10 NOTE — Progress Notes (Signed)
  Fill Date ID Written Drug Qty Days Prescriber Rx # Pharmacy Refill Daily Dose * Pymt Type PMP  08/27/2017  1  06/12/2017  Alprazolam 0.5 Mg Tablet  60 30 Ma Sim  [id number]  Car ([id number])  2 2.00 LME Comm Ins  [place]  08/27/2017  1  06/12/2017  Hydrocodone-Acetamin 10-325 Mg  60 30 Ma Sim  [id number]  Car ([id number])  0 20.00 MME Comm Ins  [place]  07/26/2017  1  06/12/2017  Alprazolam 0.5 Mg Tablet  60 30 Ma Sim  [id number]  Car ([id number])  1 2.00 LME Comm Ins  [place]  07/26/2017  1  06/12/2017  Hydrocodone-Acetamin 10-325 Mg  60 30 Ma Sim  [id number]  Car ([id number])  0 20.00 MME Comm Ins  [place]  06/29/2017  1  06/12/2017  Alprazolam 0.5 Mg Tablet  60 30 Ma Sim  [id number]  Car ([id number])  0 2.00 LME Comm Ins  [place]  06/28/2017  1  04/14/2017  Hydrocodone-Acetamin 10-325 Mg  60 30 Ma Sim  [id number]  Car ([id number])  0 20.00 MME Comm Ins  [place]  06/02/2017  1  06/02/2017  Alprazolam 0.5 Mg Tablet  60 30 Ma Sim  [id number]  Car ([id number])  0 2.00 LME Comm Ins  [place]  05/29/2017  1  04/14/2017  Hydrocodone-Acetamin 10-325 Mg  60 30 Ma Sim  [id number]  Car ([id number])  0 20.00 MME Comm Ins  [place]  05/01/2017  1  04/14/2017  Hydrocodone-Acetamin 10-325 Mg  60 30 Ma Sim  [id number]  Car ([id number])  0 20.00 MME Comm Ins  [place]  05/01/2017  1  02/04/2017  Alprazolam 0.5 Mg Tablet  60 30 Ma Sim  [id number]  Car ([id number])  3 2.00 LME Comm Ins  [place]  04/01/2017  1  02/04/2017  Alprazolam 0.5 Mg Tablet  60 30 Ma Sim  [id number]  Car ([id number])  2 2.00 LME Comm Ins  [place]  03/31/2017  1  01/23/2017  Hydrocodone-Acetamin 10-325 Mg  60 30 Ma Sim  [id number]  Car ([id number])  0 20.00 MME Comm Ins  [place]  03/04/2017  1  02/04/2017  Alprazolam 0.5 Mg Tablet  60 30 Ma Sim  [id number]  Car ([id number])  1 2.00 LME Comm Ins  [place]  02/04/2017  1  02/04/2017  Alprazolam 0.5 Mg Tablet  60 30 Ma Sim  [id number]  Car ([id number])  0 2.00 LME Comm Ins    01/26/2017  1  01/23/2017  Hydrocodone-Acetamin 10-325 Mg

## 2017-09-11 LAB — LIPID PANEL
CHOLESTEROL: 202 mg/dL — AB (ref <200)
HDL: 45 mg/dL — AB (ref >50)
LDL Cholesterol (Calc): 116 mg/dL (calc) — ABNORMAL HIGH
Non-HDL Cholesterol (Calc): 157 mg/dL (calc) — ABNORMAL HIGH (ref <130)
Total CHOL/HDL Ratio: 4.5 (calc) (ref <5.0)
Triglycerides: 307 mg/dL — ABNORMAL HIGH (ref <150)

## 2017-09-11 LAB — COMPLETE METABOLIC PANEL WITH GFR
AG RATIO: 1.4 (calc) (ref 1.0–2.5)
ALBUMIN MSPROF: 4.6 g/dL (ref 3.6–5.1)
ALKALINE PHOSPHATASE (APISO): 121 U/L (ref 33–130)
ALT: 16 U/L (ref 6–29)
AST: 21 U/L (ref 10–35)
BILIRUBIN TOTAL: 0.6 mg/dL (ref 0.2–1.2)
BUN: 13 mg/dL (ref 7–25)
CHLORIDE: 105 mmol/L (ref 98–110)
CO2: 32 mmol/L (ref 20–32)
Calcium: 10.8 mg/dL — ABNORMAL HIGH (ref 8.6–10.4)
Creat: 0.87 mg/dL (ref 0.60–0.93)
GFR, Est African American: 77 mL/min/{1.73_m2} (ref >=60)
GFR, Est Non African American: 67 mL/min/{1.73_m2} (ref >=60)
GLOBULIN: 3.2 g/dL (ref 1.9–3.7)
Glucose, Bld: 126 mg/dL — ABNORMAL HIGH (ref 65–99)
POTASSIUM: 4.3 mmol/L (ref 3.5–5.3)
SODIUM: 141 mmol/L (ref 135–146)
Total Protein: 7.8 g/dL (ref 6.1–8.1)

## 2017-09-11 LAB — HEMOGLOBIN A1C
EAG (MMOL/L): 7.4 (calc)
Hgb A1c MFr Bld: 6.3 % of total Hgb — ABNORMAL HIGH (ref <5.7)
MEAN PLASMA GLUCOSE: 134 (calc)

## 2017-09-11 LAB — CBC
HEMATOCRIT: 44.1 % (ref 35.0–45.0)
Hemoglobin: 15.1 g/dL (ref 11.7–15.5)
MCH: 30 pg (ref 27.0–33.0)
MCHC: 34.2 g/dL (ref 32.0–36.0)
MCV: 87.5 fL (ref 80.0–100.0)
MPV: 9.7 fL (ref 7.5–12.5)
Platelets: 344 10*3/uL (ref 140–400)
RBC: 5.04 10*6/uL (ref 3.80–5.10)
RDW: 12.8 % (ref 11.0–15.0)
WBC: 9.9 10*3/uL (ref 3.8–10.8)

## 2017-09-11 LAB — TSH: TSH: 1.92 mIU/L (ref 0.40–4.50)

## 2017-09-11 LAB — VITAMIN D 25 HYDROXY (VIT D DEFICIENCY, FRACTURES): Vit D, 25-Hydroxy: 34 ng/mL (ref 30–100)

## 2017-09-11 NOTE — BH Specialist Note (Signed)
A user error has taken place: encounter opened in error, closed for administrative reasons.

## 2017-09-11 NOTE — Telephone Encounter (Signed)
A user error has taken place: encounter opened in error, closed for administrative reasons.

## 2017-09-12 ENCOUNTER — Encounter: Payer: Self-pay | Admitting: Family Medicine

## 2017-09-12 ENCOUNTER — Other Ambulatory Visit: Payer: Self-pay

## 2017-09-12 ENCOUNTER — Other Ambulatory Visit: Payer: Self-pay | Admitting: Family Medicine

## 2017-09-12 MED ORDER — HYDROCODONE-ACETAMINOPHEN 10-325 MG PO TABS: ORAL_TABLET | ORAL | 0 refills | Status: DC

## 2017-09-12 MED ORDER — HYDROCODONE-ACETAMINOPHEN 10-325 MG PO TABS: ORAL_TABLET | ORAL | 0 refills | Status: AC

## 2017-09-12 MED ORDER — ROSUVASTATIN CALCIUM 40 MG PO TABS: 40.0000 mg | ORAL_TABLET | Freq: Every day | ORAL | 5 refills | Status: DC

## 2017-09-12 NOTE — Assessment & Plan Note (Signed)
Uncontrolled, needs to change diet, reduce fat, will change to crestor also Hyperlipidemia:Low fat diet discussed and encouraged.   Lipid Panel  Lab Results  Component Value Date   CHOL 202 (H) 09/10/2017   HDL 45 (L) 09/10/2017   LDLCALC 116 (H) 09/10/2017   LDLDIRECT 166 (H) 10/11/2009   TRIG 307 (H) 09/10/2017   CHOLHDL 4.5 09/10/2017

## 2017-09-12 NOTE — Assessment & Plan Note (Signed)
Controlled, no change in medication DASH diet and commitment to daily physical activity for a minimum of 30 minutes discussed and encouraged, as a part of hypertension management. The importance of attaining a healthy weight is also discussed.  BP/Weight 09/10/2017 06/12/2017 04/14/2017 01/23/2017 12/11/2016 9/57/4734 0/05/7094  Systolic BP 438 381 840 375 436 067 703  Diastolic BP 76 50 80 78 70 70 68  Wt. (Lbs) 132 130.12 130 135 134.12 133 136.12  BMI 24.14 23.8 24.56 25.51 25.34 25.13 25.72

## 2017-09-12 NOTE — Assessment & Plan Note (Signed)
Carla [name] is reminded of the importance of commitment to daily physical activity for 30 minutes or more, as able and the need to limit carbohydrate intake to 30 to 60 grams per meal to help with blood sugar control.  DIET CONTROLLED  Carla [name] is reminded of the importance of daily foot exam, annual eye examination, and good blood sugar, blood pressure and cholesterol control.  Diabetic Labs Latest Ref Rng & Units 09/10/2017 02/17/2017 10/14/2016 07/23/2016 09/27/2015  HbA1c <5.7 % of total Hgb 6.3(H) 6.1(H) 6.0(H) 6.1(H) -  Microalbumin Not Estab. ug/mL - - 12.4(H) - -  Micro/Creat Ratio 0.0 - 30.0 mg/g creat - - 13.1 - -  Chol <200 mg/dL 202(H) 171 303(H) 196 -  HDL >50 mg/dL 45(L) 58 45(L) 49(L) -  Calc LDL mg/dL (calc) 116(H) 86 192(H) 97 -  Triglycerides <150 mg/dL 307(H) 172(H) 330(H) 249(H) -  Creatinine 0.60 - 0.93 mg/dL 0.87 0.68 0.98(H) 1.02(H) 0.96   BP/Weight 09/10/2017 06/12/2017 04/14/2017 01/23/2017 12/11/2016 0/37/5436 0/08/7701  Systolic BP 403 524 818 590 931 121 624  Diastolic BP 76 50 80 78 70 70 68  Wt. (Lbs) 132 130.12 130 135 134.12 133 136.12  BMI 24.14 23.8 24.56 25.51 25.34 25.13 25.72   Foot/eye exam completion dates Latest Ref Rng & Units 09/10/2017 07/23/2016  Eye Exam No Retinopathy - -  Foot Form Completion - Done Done

## 2017-09-12 NOTE — Progress Notes (Signed)
Carla [name]     MRN: [id number]      DOB: [birth year]   HPI Carla [name] is here for follow up and re-evaluation of chronic medical conditions, medication management and review of any available recent lab and radiology data.  Preventive health is updated, specifically  Cancer screening and Immunization.    The PT denies any adverse reactions to current medications since the last visit.  C/o tress and anxiety, continues to feel overwhelmed and be stressed over Carla [name]'s past infidelity, and get depressed about it, has not followed through on telepsychiatry, prefers to come into the office and talk with me about it from time to time Enjoys spending time with Carla grandchild however  3 day  H/o dysuria and frequency  ROS Denies recent fever or chills. Denies sinus pressure, nasal congestion, ear pain or sore throat. Denies chest congestion, productive cough or wheezing. Denies chest pains, palpitations and leg swelling Denies abdominal pain, nausea, vomiting,diarrhea or constipation.    C/o chronic   pain,  and limitation in mobility. Denies headaches, seizures, numbness, or tingling. c/o depression, anxiety and insomnia.Not suicidal or homicidal Denies skin break down or rash.   PE  BP 120/76   Pulse 84   Resp 16   Ht 5\' 2"  (1.575 m)   Wt 132 lb (59.9 kg)   SpO2 98%   BMI 24.14 kg/m   Patient alert and oriented and in no cardiopulmonary distress.  HEENT: No facial asymmetry, EOMI,   oropharynx pink and moist.  Neck supple no JVD, no mass.  Chest: Clear to auscultation bilaterally.Decreased air entry  CVS: S1, S2 no murmurs, no S3.Regular rate.  ABD: Soft non tender.   Ext: No edema  MS: decreased  ROM spine, shoulders, hips and knees.  Skin: Intact, no ulcerations or rash noted.  Psych: Good eye contact, normal at times tearful affect. Memory intact  Anxious at timesr depressed appearing.  CNS: CN 2-12 intact, power,  normal throughout.no focal deficits  noted.   Assessment & Plan  Essential hypertension Controlled, no change in medication DASH diet and commitment to daily physical activity for a minimum of 30 minutes discussed and encouraged, as a part of hypertension management. The importance of attaining a healthy weight is also discussed.  BP/Weight 09/10/2017 06/12/2017 04/14/2017 01/23/2017 12/11/2016 08/02/2583 05/01/7822  Systolic BP 235 361 443 154 008 676 195  Diastolic BP 76 50 80 78 70 70 68  Wt. (Lbs) 132 130.12 130 135 134.12 133 136.12  BMI 24.14 23.8 24.56 25.51 25.34 25.13 25.72       Well controlled type 2 diabetes mellitus with peripheral circulatory disorder ([place]) Carla [name] is reminded of the importance of commitment to daily physical activity for 30 minutes or more, as able and the need to limit carbohydrate intake to 30 to 60 grams per meal to help with blood sugar control.  DIET CONTROLLED  Carla [name] is reminded of the importance of daily foot exam, annual eye examination, and good blood sugar, blood pressure and cholesterol control.  Diabetic Labs Latest Ref Rng & Units 09/10/2017 02/17/2017 10/14/2016 07/23/2016 09/27/2015  HbA1c <5.7 % of total Hgb 6.3(H) 6.1(H) 6.0(H) 6.1(H) -  Microalbumin Not Estab. ug/mL - - 12.4(H) - -  Micro/Creat Ratio 0.0 - 30.0 mg/g creat - - 13.1 - -  Chol <200 mg/dL 202(H) 171 303(H) 196 -  HDL >50 mg/dL 45(L) 58 45(L) 49(L) -  Calc LDL mg/dL (calc) 116(H) 86 192(H) 97 -  Triglycerides <150 mg/dL 307(H) 172(H) 330(H) 249(H) -  Creatinine 0.60 - 0.93 mg/dL 0.87 0.68 0.98(H) 1.02(H) 0.96   BP/Weight 09/10/2017 06/12/2017 04/14/2017 01/23/2017 12/11/2016 2/35/5732 2/0/2542  Systolic BP 706 237 628 315 176 160 737  Diastolic BP 76 50 80 78 70 70 68  Wt. (Lbs) 132 130.12 130 135 134.12 133 136.12  BMI 24.14 23.8 24.56 25.51 25.34 25.13 25.72   Foot/eye exam completion dates Latest Ref Rng & Units 09/10/2017 07/23/2016  Eye Exam No Retinopathy - -  Foot Form Completion - Done Done          Hyperlipidemia LDL goal <100 Uncontrolled, needs to change diet, reduce fat, will change to [place] also Hyperlipidemia:Low fat diet discussed and encouraged.   Lipid Panel  Lab Results  Component Value Date   CHOL 202 (H) 09/10/2017   HDL 45 (L) 09/10/2017   LDLCALC 116 (H) 09/10/2017   LDLDIRECT 166 (H) 10/11/2009   TRIG 307 (H) 09/10/2017   CHOLHDL 4.5 09/10/2017       Encounter for chronic pain management The patient's Controlled Substance registry is reviewed and compliance confirmed. Adequacy of  Pain control and level of function is assessed. Medication dosing is adjusted as deemed appropriate. Twelve weeks of medication is prescribed , patient signs for the script and is provided with a follow up appointment between 11 to 12 weeks .   GAD (generalized anxiety disorder) unchanged , needs therapy but resitant  Dysuria Symptomatic with abn UA, will await c/s , no fever or flank pain

## 2017-09-12 NOTE — Assessment & Plan Note (Signed)
The patient's Controlled Substance registry is reviewed and compliance confirmed. Adequacy of  Pain control and level of function is assessed. Medication dosing is adjusted as deemed appropriate. Twelve weeks of medication is prescribed , patient signs for the script and is provided with a follow up appointment between 11 to 12 weeks .  

## 2017-09-12 NOTE — Assessment & Plan Note (Signed)
Symptomatic with abn UA, will await c/s , no fever or flank pain

## 2017-09-12 NOTE — Assessment & Plan Note (Signed)
unchanged , needs therapy but resitant

## 2017-09-22 ENCOUNTER — Ambulatory Visit: Payer: PPO | Admitting: Family Medicine

## 2017-09-29 ENCOUNTER — Other Ambulatory Visit: Payer: Self-pay | Admitting: Family Medicine

## 2017-10-03 ENCOUNTER — Other Ambulatory Visit: Payer: Self-pay | Admitting: Family Medicine

## 2017-10-06 ENCOUNTER — Telehealth: Payer: Self-pay | Admitting: Family Medicine

## 2017-10-06 NOTE — Telephone Encounter (Signed)
Patient came by to say that she went to [place] to pickup her refill of rivastigmine. The pharmacy told her she already picked it up. But she doesn't remember picking it up & she doesn't have it. She is not sure what she should do. She has been out for 1 week. Cb#: [id number]/ [id number]

## 2017-10-06 NOTE — Telephone Encounter (Signed)
Discussed with the patient in the office. She will have to speak with her insurance company and the pharmacy. We have no control over this.

## 2017-10-15 ENCOUNTER — Other Ambulatory Visit: Payer: Self-pay | Admitting: Family Medicine

## 2017-10-20 ENCOUNTER — Other Ambulatory Visit: Payer: Self-pay | Admitting: Family Medicine

## 2017-11-01 ENCOUNTER — Emergency Department (HOSPITAL_COMMUNITY): Payer: PPO

## 2017-11-01 ENCOUNTER — Inpatient Hospital Stay (HOSPITAL_COMMUNITY)
Admission: EM | Admit: 2017-11-01 | Discharge: 2017-11-02 | DRG: 389 | Disposition: A | Discharge status: Home / Self Care | Payer: PPO | Attending: Internal Medicine | Admitting: Internal Medicine

## 2017-11-01 ENCOUNTER — Encounter (HOSPITAL_COMMUNITY): Payer: Self-pay

## 2017-11-01 ENCOUNTER — Inpatient Hospital Stay (HOSPITAL_COMMUNITY): Payer: PPO

## 2017-11-01 ENCOUNTER — Other Ambulatory Visit: Payer: Self-pay

## 2017-11-01 DIAGNOSIS — E785 Hyperlipidemia, unspecified: Secondary | ICD-10-CM | POA: Diagnosis present

## 2017-11-01 DIAGNOSIS — Z8249 Family history of ischemic heart disease and other diseases of the circulatory system: Secondary | ICD-10-CM

## 2017-11-01 DIAGNOSIS — R197 Diarrhea, unspecified: Secondary | ICD-10-CM | POA: Diagnosis not present

## 2017-11-01 DIAGNOSIS — K219 Gastro-esophageal reflux disease without esophagitis: Secondary | ICD-10-CM | POA: Diagnosis not present

## 2017-11-01 DIAGNOSIS — K529 Noninfective gastroenteritis and colitis, unspecified: Secondary | ICD-10-CM

## 2017-11-01 DIAGNOSIS — Z833 Family history of diabetes mellitus: Secondary | ICD-10-CM

## 2017-11-01 DIAGNOSIS — R112 Nausea with vomiting, unspecified: Secondary | ICD-10-CM

## 2017-11-01 DIAGNOSIS — I2510 Atherosclerotic heart disease of native coronary artery without angina pectoris: Secondary | ICD-10-CM | POA: Diagnosis not present

## 2017-11-01 DIAGNOSIS — Z825 Family history of asthma and other chronic lower respiratory diseases: Secondary | ICD-10-CM

## 2017-11-01 DIAGNOSIS — N1330 Unspecified hydronephrosis: Secondary | ICD-10-CM | POA: Diagnosis not present

## 2017-11-01 DIAGNOSIS — K56691 Other complete intestinal obstruction: Secondary | ICD-10-CM | POA: Diagnosis not present

## 2017-11-01 DIAGNOSIS — R1111 Vomiting without nausea: Secondary | ICD-10-CM | POA: Diagnosis not present

## 2017-11-01 DIAGNOSIS — Z87891 Personal history of nicotine dependence: Secondary | ICD-10-CM

## 2017-11-01 DIAGNOSIS — E876 Hypokalemia: Secondary | ICD-10-CM | POA: Diagnosis present

## 2017-11-01 DIAGNOSIS — J4489 Other specified chronic obstructive pulmonary disease: Secondary | ICD-10-CM | POA: Diagnosis present

## 2017-11-01 DIAGNOSIS — Z86010 Personal history of colonic polyps: Secondary | ICD-10-CM

## 2017-11-01 DIAGNOSIS — F329 Major depressive disorder, single episode, unspecified: Secondary | ICD-10-CM

## 2017-11-01 DIAGNOSIS — R1110 Vomiting, unspecified: Secondary | ICD-10-CM | POA: Diagnosis not present

## 2017-11-01 DIAGNOSIS — Z4682 Encounter for fitting and adjustment of non-vascular catheter: Secondary | ICD-10-CM | POA: Diagnosis not present

## 2017-11-01 DIAGNOSIS — I10 Essential (primary) hypertension: Secondary | ICD-10-CM | POA: Diagnosis not present

## 2017-11-01 DIAGNOSIS — E1151 Type 2 diabetes mellitus with diabetic peripheral angiopathy without gangrene: Secondary | ICD-10-CM | POA: Diagnosis not present

## 2017-11-01 DIAGNOSIS — K56609 Unspecified intestinal obstruction, unspecified as to partial versus complete obstruction: Secondary | ICD-10-CM | POA: Diagnosis not present

## 2017-11-01 DIAGNOSIS — Z800 Family history of malignant neoplasm of digestive organs: Secondary | ICD-10-CM | POA: Diagnosis not present

## 2017-11-01 DIAGNOSIS — E1165 Type 2 diabetes mellitus with hyperglycemia: Secondary | ICD-10-CM | POA: Diagnosis present

## 2017-11-01 DIAGNOSIS — J449 Chronic obstructive pulmonary disease, unspecified: Secondary | ICD-10-CM

## 2017-11-01 DIAGNOSIS — M810 Age-related osteoporosis without current pathological fracture: Secondary | ICD-10-CM | POA: Diagnosis present

## 2017-11-01 DIAGNOSIS — K2970 Gastritis, unspecified, without bleeding: Secondary | ICD-10-CM | POA: Diagnosis not present

## 2017-11-01 DIAGNOSIS — Z7983 Long term (current) use of bisphosphonates: Secondary | ICD-10-CM | POA: Diagnosis not present

## 2017-11-01 DIAGNOSIS — Z7982 Long term (current) use of aspirin: Secondary | ICD-10-CM

## 2017-11-01 DIAGNOSIS — F32A Depression, unspecified: Secondary | ICD-10-CM | POA: Diagnosis present

## 2017-11-01 LAB — CBC WITH DIFFERENTIAL/PLATELET
BASOS ABS: 0 10*3/uL (ref 0.0–0.1)
Basophils Relative: 0 %
EOS PCT: 0 %
Eosinophils Absolute: 0 10*3/uL (ref 0.0–0.7)
HEMATOCRIT: 42 % (ref 36.0–46.0)
Hemoglobin: 13.6 g/dL (ref 12.0–15.0)
LYMPHS PCT: 10 %
Lymphs Abs: 1.1 10*3/uL (ref 0.7–4.0)
MCH: 30.2 pg (ref 26.0–34.0)
MCHC: 32.4 g/dL (ref 30.0–36.0)
MCV: 93.1 fL (ref 78.0–100.0)
MONOS PCT: 3 %
Monocytes Absolute: 0.4 10*3/uL (ref 0.1–1.0)
Neutro Abs: 9.8 10*3/uL — ABNORMAL HIGH (ref 1.7–7.7)
Neutrophils Relative %: 87 %
PLATELETS: 232 10*3/uL (ref 150–400)
RBC: 4.51 MIL/uL (ref 3.87–5.11)
RDW: 13.5 % (ref 11.5–15.5)
WBC: 11.3 10*3/uL — ABNORMAL HIGH (ref 4.0–10.5)

## 2017-11-01 LAB — GLUCOSE, CAPILLARY
GLUCOSE-CAPILLARY: 122 mg/dL — AB (ref 70–99)
Glucose-Capillary: 137 mg/dL — ABNORMAL HIGH (ref 70–99)
Glucose-Capillary: 87 mg/dL (ref 70–99)

## 2017-11-01 LAB — COMPREHENSIVE METABOLIC PANEL
ALBUMIN: 3.8 g/dL (ref 3.5–5.0)
ALT: 16 U/L (ref 0–44)
AST: 25 U/L (ref 15–41)
Alkaline Phosphatase: 81 U/L (ref 38–126)
Anion gap: 9 (ref 5–15)
BILIRUBIN TOTAL: 0.6 mg/dL (ref 0.3–1.2)
BUN: 18 mg/dL (ref 8–23)
CO2: 24 mmol/L (ref 22–32)
CREATININE: 0.97 mg/dL (ref 0.44–1.00)
Calcium: 9.1 mg/dL (ref 8.9–10.3)
Chloride: 109 mmol/L (ref 98–111)
GFR calc Af Amer: >60 mL/min (ref >60)
GFR, EST NON AFRICAN AMERICAN: 57 mL/min — AB (ref >60)
GLUCOSE: 165 mg/dL — AB (ref 70–99)
Potassium: 3.1 mmol/L — ABNORMAL LOW (ref 3.5–5.1)
Sodium: 142 mmol/L (ref 135–145)
TOTAL PROTEIN: 6.8 g/dL (ref 6.5–8.1)

## 2017-11-01 LAB — MAGNESIUM: Magnesium: 2.3 mg/dL (ref 1.7–2.4)

## 2017-11-01 LAB — URINALYSIS, ROUTINE W REFLEX MICROSCOPIC
BACTERIA UA: NONE SEEN
Bilirubin Urine: NEGATIVE
GLUCOSE, UA: NEGATIVE mg/dL
Ketones, ur: NEGATIVE mg/dL
LEUKOCYTES UA: NEGATIVE
Nitrite: NEGATIVE
PROTEIN: NEGATIVE mg/dL
SPECIFIC GRAVITY, URINE: 1.012 (ref 1.005–1.030)
pH: 9 — ABNORMAL HIGH (ref 5.0–8.0)

## 2017-11-01 LAB — LIPASE, BLOOD: LIPASE: 27 U/L (ref 11–51)

## 2017-11-01 LAB — LACTIC ACID, PLASMA: LACTIC ACID, VENOUS: 1.7 mmol/L (ref 0.5–1.9)

## 2017-11-01 LAB — PHOSPHORUS: PHOSPHORUS: 2.7 mg/dL (ref 2.5–4.6)

## 2017-11-01 LAB — TSH: TSH: 0.851 u[IU]/mL (ref 0.350–4.500)

## 2017-11-01 MED ORDER — DICYCLOMINE HCL 10 MG/ML IM SOLN
20.0000 mg | Freq: Once | INTRAMUSCULAR | Status: AC
  Administered 2017-11-01: 20 mg via INTRAMUSCULAR
  Filled 2017-11-01: qty 2

## 2017-11-01 MED ORDER — PROMETHAZINE HCL 25 MG/ML IJ SOLN
INTRAMUSCULAR | Status: AC
  Filled 2017-11-01: qty 1

## 2017-11-01 MED ORDER — INSULIN ASPART 100 UNIT/ML ~~LOC~~ SOLN
0.0000 [IU] | Freq: Four times a day (QID) | SUBCUTANEOUS | Status: DC
  Administered 2017-11-01: 2 [IU] via SUBCUTANEOUS
  Administered 2017-11-01 – 2017-11-02 (×2): 1 [IU] via SUBCUTANEOUS

## 2017-11-01 MED ORDER — LIDOCAINE HCL URETHRAL/MUCOSAL 2 % EX GEL
CUTANEOUS | Status: AC
  Filled 2017-11-01: qty 10

## 2017-11-01 MED ORDER — HYDRALAZINE HCL 20 MG/ML IJ SOLN: 10.0000 mg | Freq: Three times a day (TID) | INTRAMUSCULAR | Status: DC | PRN

## 2017-11-01 MED ORDER — PROMETHAZINE HCL 25 MG/ML IJ SOLN
12.5000 mg | Freq: Once | INTRAMUSCULAR | Status: AC
  Administered 2017-11-01: 12.5 mg via INTRAVENOUS

## 2017-11-01 MED ORDER — SODIUM CHLORIDE 0.9 % IV BOLUS
1000.0000 mL | Freq: Once | INTRAVENOUS | Status: AC
  Administered 2017-11-01: 1000 mL via INTRAVENOUS

## 2017-11-01 MED ORDER — IPRATROPIUM-ALBUTEROL 0.5-2.5 (3) MG/3ML IN SOLN
3.0000 mL | Freq: Four times a day (QID) | RESPIRATORY_TRACT | Status: DC | PRN
Start: 2017-11-01 — End: 2017-11-02

## 2017-11-01 MED ORDER — METRONIDAZOLE IN NACL 5-0.79 MG/ML-% IV SOLN
500.0000 mg | Freq: Once | INTRAVENOUS | Status: AC
  Administered 2017-11-01: 500 mg via INTRAVENOUS
  Filled 2017-11-01: qty 100

## 2017-11-01 MED ORDER — FAMOTIDINE IN NACL 20-0.9 MG/50ML-% IV SOLN
20.0000 mg | INTRAVENOUS | Status: DC
  Administered 2017-11-01: 20 mg via INTRAVENOUS
  Filled 2017-11-01 (×2): qty 50

## 2017-11-01 MED ORDER — SODIUM CHLORIDE 0.9% FLUSH: 3.0000 mL | Freq: Two times a day (BID) | INTRAVENOUS | Status: DC

## 2017-11-01 MED ORDER — SODIUM CHLORIDE 0.9 % IV SOLN: INTRAVENOUS | Status: DC

## 2017-11-01 MED ORDER — BENZOCAINE 20 % MT AERO
INHALATION_SPRAY | OROMUCOSAL | Status: AC
  Filled 2017-11-01: qty 57

## 2017-11-01 MED ORDER — IOPAMIDOL (ISOVUE-300) INJECTION 61%
100.0000 mL | Freq: Once | INTRAVENOUS | Status: AC | PRN
  Administered 2017-11-01: 100 mL via INTRAVENOUS

## 2017-11-01 MED ORDER — SODIUM CHLORIDE 0.9 % IV BOLUS
500.0000 mL | Freq: Once | INTRAVENOUS | Status: AC
  Administered 2017-11-01: 500 mL via INTRAVENOUS

## 2017-11-01 MED ORDER — HEPARIN SODIUM (PORCINE) 5000 UNIT/ML IJ SOLN
5000.0000 [IU] | Freq: Three times a day (TID) | INTRAMUSCULAR | Status: DC
  Administered 2017-11-01 – 2017-11-02 (×3): 5000 [IU] via SUBCUTANEOUS
  Filled 2017-11-01 (×3): qty 1

## 2017-11-01 MED ORDER — POTASSIUM CHLORIDE IN NACL 40-0.9 MEQ/L-% IV SOLN
INTRAVENOUS | Status: DC
  Administered 2017-11-01 – 2017-11-02 (×2): 75 mL/h via INTRAVENOUS
  Filled 2017-11-01 (×5): qty 1000

## 2017-11-01 MED ORDER — PHENOL 1.4 % MT LIQD
1.0000 | OROMUCOSAL | Status: DC | PRN
Start: 2017-11-01 — End: 2017-11-02
  Administered 2017-11-01: 1 via OROMUCOSAL
  Filled 2017-11-01: qty 177

## 2017-11-01 MED ORDER — LIDOCAINE VISCOUS HCL 2 % MT SOLN
OROMUCOSAL | Status: AC
  Administered 2017-11-01: 15 mL via OROMUCOSAL
  Filled 2017-11-01: qty 15

## 2017-11-01 MED ORDER — METRONIDAZOLE IN NACL 5-0.79 MG/ML-% IV SOLN
500.0000 mg | Freq: Three times a day (TID) | INTRAVENOUS | Status: DC
  Administered 2017-11-01 – 2017-11-02 (×2): 500 mg via INTRAVENOUS
  Filled 2017-11-01 (×4): qty 100

## 2017-11-01 MED ORDER — ONDANSETRON HCL 4 MG/2ML IJ SOLN
4.0000 mg | Freq: Once | INTRAMUSCULAR | Status: AC
  Administered 2017-11-01: 4 mg via INTRAVENOUS
  Filled 2017-11-01: qty 2

## 2017-11-01 MED ORDER — INSULIN ASPART 100 UNIT/ML ~~LOC~~ SOLN: 0.0000 [IU] | Freq: Four times a day (QID) | SUBCUTANEOUS | Status: DC

## 2017-11-01 MED ORDER — ONDANSETRON HCL 4 MG PO TABS: 4.0000 mg | ORAL_TABLET | Freq: Four times a day (QID) | ORAL | Status: DC | PRN

## 2017-11-01 MED ORDER — CIPROFLOXACIN IN D5W 400 MG/200ML IV SOLN
400.0000 mg | Freq: Two times a day (BID) | INTRAVENOUS | Status: DC
  Administered 2017-11-01 (×2): 400 mg via INTRAVENOUS
  Filled 2017-11-01 (×3): qty 200

## 2017-11-01 MED ORDER — FENTANYL CITRATE (PF) 100 MCG/2ML IJ SOLN
50.0000 ug | Freq: Once | INTRAMUSCULAR | Status: AC
  Administered 2017-11-01: 50 ug via INTRAVENOUS
  Filled 2017-11-01: qty 2

## 2017-11-01 MED ORDER — CIPROFLOXACIN IN D5W 400 MG/200ML IV SOLN
400.0000 mg | Freq: Once | INTRAVENOUS | Status: DC
  Filled 2017-11-01: qty 200

## 2017-11-01 MED ORDER — LIDOCAINE VISCOUS HCL 2 % MT SOLN
15.0000 mL | Freq: Once | OROMUCOSAL | Status: AC
  Administered 2017-11-01: 15 mL via OROMUCOSAL

## 2017-11-01 MED ORDER — POTASSIUM CHLORIDE 10 MEQ/100ML IV SOLN: 10.0000 meq | INTRAVENOUS | Status: AC

## 2017-11-01 MED ORDER — ONDANSETRON HCL 4 MG/2ML IJ SOLN: 4.0000 mg | Freq: Four times a day (QID) | INTRAMUSCULAR | Status: DC | PRN

## 2017-11-01 NOTE — ED Triage Notes (Signed)
Pt in by [facility] for abd pain x 12 hours with n/v/d.

## 2017-11-01 NOTE — H&P (Signed)
History and Physical    Carla [name] QQV:[id number] DOB: [birth year] DOA: 11/01/2017  Referring MD/NP/PA: Dr. Tomi Bamberger PCP: Fayrene Helper, MD  Patient coming from: home  Chief Complaint: Abdominal pain, nausea and vomiting.  HPI: Carla [name] is a 72 y.o. female with a past medical history significant for diabetes, gastroesophageal reflux disease, depression, hyperlipidemia, history of cholecystectomy and hysterectomy, and prior history of tobacco abuse with subsequent emphysema and chronic bronchitis; who presented to the emergency department with complaints of abdominal pain, nausea and vomiting.  Patient reports mid abdominal discomfort, crampy, 5 out of 10 in intensity, present for the last 6 to 8 weeks, worsening and with association of nausea.  No radiation nothing makes the pain better or worse.  2 days prior to admission she reported that this abdominal discomfort has intensified and she is having now also vomiting.  Last bowel movements was 2 days ago.  There has not been any chest pain, shortness of breath, hematuria, dysuria, melena, hematochezia, hematemesis, focal weakness, palpitations, fever, chills or any other complaints.  In the ED work-up demonstrated hypokalemia and findings suggestive of SBO with enteritis.  WBCs mildly elevated.  Patient receive 1.5 L of IV fluids, antiemetics and analgesics medication and 1 dose of Cipro/Flagyl.  General surgery was consulted recommended to place an NG tube and they will see patient in consultation as there is a transition point on CT scan of her abdomen.  TRH has been called to place patient in the hospital for further evaluation and treatment.  Past Medical/Surgical History: Past Medical History:  Diagnosis Date  . ASCVD (arteriosclerotic cardiovascular disease)    No critical disease on cath in 8/97: mild AI with trival, if any l AS; negative stress nuclear in 2010  . Back pain   . Cervical disc herniation   . Chronic  bronchitis   . Colon polyps   . Depression   . Elevated hemoglobin A1c   . Fasting hyperglycemia   . GERD (gastroesophageal reflux disease)   . HSV (herpes simplex virus) infection   . Hydronephrosis   . Hypertension   . Osteoporosis   . Peripheral vascular disease ([place])    stauts post aortabifemoral graft    . Secondary erythrocytosis 08/05/2012   Secondary to COPD  . Tobacco abuse    has stopped    Past Surgical History:  Procedure Laterality Date  . ABDOMINAL HYSTERECTOMY    . aorta bifem graft    . CHOLECYSTECTOMY    . COLONOSCOPY  06/23/2006   Dr. Fields:Normal colon without evidence of polyps, masses, inflammatory changes/Normal retroflex view of the rectum  . COLONOSCOPY N/A 08/24/2013   Procedure: COLONOSCOPY;  Surgeon: Danie Binder, MD;  Location: [facility];  Service: Endoscopy;  Laterality: N/A;  10:30-moved to [place] notified pt  . lysis of adhesions  2000  . PARTIAL HYSTERECTOMY    . right kidney surgery following damage during arterial surgery    . TOTAL ABDOMINAL HYSTERECTOMY W/ BILATERAL SALPINGOOPHORECTOMY  1975    Social History:  reports that she quit smoking about 6 years ago. Her smoking use included cigarettes. She has a 150.00 pack-year smoking history. She has never used smokeless tobacco. She reports that she does not drink alcohol or use drugs.  Allergies: Allergies  Allergen Reactions  . Aricept [Donepezil Hcl]     diarrheah  . Calcium-Containing Compounds Other (See Comments)    Pt has hyperparathyroidism which results in hypercalcemia    Family  History:  Family History  Problem Relation Age of Onset  . Heart attack Father   . COPD Sister   . Heart disease Sister   . Diabetes Sister   . Coronary artery disease Sister   . Liver cancer Sister 40  . Diabetes Sister   . Diabetes Sister   . COPD Sister   . Colon cancer Neg Hx     Prior to Admission medications   Medication Sig Start Date End Date Taking? Authorizing Provider   acyclovir (ZOVIRAX) 800 MG tablet TAKE ONE TABLET BY MOUTH 2 TIMES DAILY. 03/20/17   Fayrene Helper, MD  alendronate (FOSAMAX) 70 MG tablet TAKE 1 TABLET EACH WEEK 30 MIN BEFORE BREAKFAST WITH 8 OUNCES OF WATER FOR OSTEOPOROSIS. 06/05/15   Fayrene Helper, MD  ALPRAZolam Duanne Moron) 0.5 MG tablet Take 1 tablet (0.5 mg total) by mouth 2 (two) times daily. 06/12/17   Fayrene Helper, MD  amLODipine (NORVASC) 10 MG tablet TAKE ONE TABLET BY MOUTH ONCE DAILY. 10/16/17   Fayrene Helper, MD  aspirin EC 81 MG tablet Take 81 mg by mouth daily.    [provider]  benzonatate (TESSALON) 100 MG capsule TAKE 1 CAPSULE BY MOUTH TWICE DAILY AS NEEDED FOR COUGH. 09/15/17   Fayrene Helper, MD  benzonatate (TESSALON) 100 MG capsule TAKE 1 CAPSULE BY MOUTH TWICE DAILY AS NEEDED FOR COUGH. 09/29/17   Fayrene Helper, MD  FLUoxetine (PROZAC) 10 MG tablet Take 1 tablet (10 mg total) by mouth daily. 12/11/16   Fayrene Helper, MD  gabapentin (NEURONTIN) 100 MG capsule TAKE (2) CAPSULES BY MOUTH AT BEDTIME. 09/29/17   Fayrene Helper, MD  HYDROcodone-acetaminophen [facility]) 10-325 MG tablet One tablet twice daily 06/12/17   Fayrene Helper, MD  HYDROcodone-acetaminophen [facility]) 10-325 MG tablet One tablet two times daily for back pain 10/27/17 11/26/17  Fayrene Helper, MD  HYDROcodone-acetaminophen [facility]) 10-325 MG tablet One tablet two times daily for pain 11/27/17 12/27/17  Fayrene Helper, MD  montelukast (SINGULAIR) 10 MG tablet TAKE (1) TABLET BY MOUTH AT BEDTIME. 08/26/17   Fayrene Helper, MD  Multiple Vitamin (MULTIVITAMIN WITH MINERALS) TABS Take 1 tablet by mouth every evening.     [provider]  rivastigmine (EXELON) 4.6 mg/24hr APPLY 1 PATCH TO THE SKIN DAILY. DISCARD USED PATCH. 10/03/17   Fayrene Helper, MD  rosuvastatin (CRESTOR) 40 MG tablet Take 1 tablet (40 mg total) by mouth daily. 09/12/17   Fayrene Helper, MD    Review of Systems:  Negative  except as otherwise mentioned in HPI.  Physical Exam: Vitals:   11/01/17 0430 11/01/17 0500 11/01/17 0701 11/01/17 0828  BP: 136/74 135/79 (!) 158/89 139/89  Pulse: 83 82 91 79  Resp:    16  Temp:      TempSrc:      SpO2: 96% 96% 93% 91%  Weight:      Height:        Constitutional: NAD, having active nausea/vomiting and reporting mid right side abdomen discomfort. Eyes: PERRL, lids and conjunctivae normal, no icterus, no nystagmus. ENMT: Mucous membranes are moist. Posterior pharynx clear of any exudate or lesions.fair dentition; no thrush. Neck: normal, supple, no masses, no thyromegaly; no JVD Respiratory: clear to auscultation bilaterally, no wheezing, no crackles. Normal respiratory effort. No accessory muscle use.  Positive scattered rhonchi. Cardiovascular: Regular rate and rhythm, no murmurs / rubs / gallops. No extremity edema. 2+ pedal pulses. No carotid bruits.  Abdomen: Tender to palpation mid abdomen and right mid lateral area; sluggish bowel sounds on auscultation, mild distension. Musculoskeletal: no clubbing / cyanosis. No joint deformity upper and lower extremities. Good ROM, no contractures. Normal muscle tone.  Skin: no rashes, lesions, ulcers. No induration Neurologic: CN 2-12 grossly intact. Sensation intact, DTR normal. Strength 5/5 in all 4.  Psychiatric: Normal judgment and insight. Alert and oriented x 3. Normal mood.    Labs on Admission: I have personally reviewed the following labs and imaging studies  CBC: Recent Labs  Lab 11/01/17 0526  WBC 11.3*  NEUTROABS 9.8*  HGB 13.6  HCT 42.0  MCV 93.1  PLT 130   Basic Metabolic Panel: Recent Labs  Lab 11/01/17 0526  NA 142  K 3.1*  CL 109  CO2 24  GLUCOSE 165*  BUN 18  CREATININE 0.97  CALCIUM 9.1  MG 2.3   GFR: Estimated Creatinine Clearance: 43.4 mL/min (by C-G formula based on SCr of 0.97 mg/dL).   Liver Function Tests: Recent Labs  Lab 11/01/17 0526  AST 25  ALT 16  ALKPHOS 81    BILITOT 0.6  PROT 6.8  ALBUMIN 3.8   Recent Labs  Lab 11/01/17 0526  LIPASE 27   Urine analysis:    Component Value Date/Time   COLORURINE COLORLESS (A) 11/01/2017 0700   APPEARANCEUR CLEAR 11/01/2017 0700   LABSPEC 1.012 11/01/2017 0700   PHURINE 9.0 (H) 11/01/2017 0700   GLUCOSEU NEGATIVE 11/01/2017 0700   HGBUR SMALL (A) 11/01/2017 0700   BILIRUBINUR NEGATIVE 11/01/2017 0700   BILIRUBINUR neg 09/10/2017 1007   KETONESUR NEGATIVE 11/01/2017 0700   PROTEINUR NEGATIVE 11/01/2017 0700   UROBILINOGEN 0.2 09/10/2017 1007   UROBILINOGEN 0.2 07/07/2013 1550   NITRITE NEGATIVE 11/01/2017 0700   LEUKOCYTESUR NEGATIVE 11/01/2017 0700    Radiological Exams on Admission: Ct Abdomen Pelvis W Contrast  Result Date: 11/01/2017 CLINICAL DATA:  Nausea, vomiting common diarrhea. Lower abdominal distention. EXAM: CT ABDOMEN AND PELVIS WITH CONTRAST TECHNIQUE: Multidetector CT imaging of the abdomen and pelvis was performed using the standard protocol following bolus administration of intravenous contrast. CONTRAST:  139mL ISOVUE-300 IOPAMIDOL (ISOVUE-300) INJECTION 61% COMPARISON:  July 07, 2013 FINDINGS: Lower chest: There is a tiny nodule in the right lung on series 4, image 2, unchanged since 2015, of no significance. Lung bases are otherwise normal. There is a small fluid containing hiatal hernia. The lower chest is otherwise unremarkable. Hepatobiliary: Previous cholecystectomy. Common bile duct dilatation is stable since 2015 with no internal filling defects identified. Portal vein is patent. No liver masses noted. Pancreas: Unremarkable. No pancreatic ductal dilatation or surrounding inflammatory changes. Spleen: Normal in size without focal abnormality. Adrenals/Urinary Tract: Adrenal glands are normal. Right hydronephrosis due to a UPJ obstruction is stable since 2015. A fat containing mass in the inferior right kidney is consistent with an angio myelolipoma. No suspicious masses on the  right. There is mild pelvicaliectasis on the left which is more prominent since 2015. However, the left ureter is normal in caliber along its entire length with no stones. The bladder is normal. Stomach/Bowel: The stomach is fluid-filled and distended. Most of the jejunum is distended consistent with obstruction. The transition point is seen on axial image 62 and coronal image 45. There is fecal material in the small bowel just proximal to the obstruction. Several loops of small bowel distal to the obstruction are thick walled with adjacent stranding. The most distal small bowel is decompressed and normal in appearance. Colonic diverticulosis  is seen without diverticulitis. No definitive colonic abnormality is noted. The appendix is not visualized but there is no secondary evidence of appendicitis. Vascular/Lymphatic: Atherosclerotic changes are seen within the nonaneurysmal aorta and its branching vessels. No adenopathy. Reproductive: Status post hysterectomy. No adnexal masses. Other: There is a small amount of free fluid in the pelvis and mesentery, likely reactive to the bowel obstruction and inflamed loops of small bowel. No free air or pneumatosis identified. Musculoskeletal: No acute or significant osseous findings. IMPRESSION: 1. There is a small bowel obstruction. The transition point is visualized within the central pelvis either in the proximal ileum or distal jejunum. Multiple loops of small bowel distal to the obstruction are thick walled with adjacent stranding/inflammation. The thick walled loops of small bowel likely represent an underlying enteritis which could be infectious, inflammatory, or ischemic. Recommend clinical correlation. 2. Mild left pelvicaliectasis, increased since 2015 with no underlying cause identified. No ureteral stones. 3. Persistent moderate right hydronephrosis due to a right UVJ obstruction. 4. Atherosclerotic changes in the aorta and branching vessels. Electronically Signed    By: Dorise Bullion III M.D   On: 11/01/2017 07:16    EKG: none.  Assessment/Plan 1-abd pain, nausea/vomiting: in the setting of SBO (small bowel obstruction) (HCC) and enteritis.  -patient with prior history of abdominal surgery and presented with abdominal pain, nausea and vomiting.  Last bowel movement was 48 hours ago she is now passing gas. -The CT scan demonstrated small bowel obstruction with transition point and also swelling/inflammation suggesting enteritis in the intestinal loops that follows transition point. -General surgery has been made aware and will see patient in consultation. -Patient has been placed n.p.o., will use as needed antiemetics and analgesics and will provide fluid resuscitation and electrolytes repletion. -Ciprofloxacin and Flagyl empirically has been started for enteritis. -Will follow clinical response. -NGT ordered and will check lactic acid.  2-hypokalemia: due to GI loses and poor PO intake. -will check Mg and PO4 -replete electrolytes as needed -follow renal function trend   3-Hyperlipidemia LDL goal <100 -will hold statins while NPO  4-type 2 diabetes with hyperglycemia: well controlled and following diet only. -Patient will be started on a sliding scale insulin with CBGs check every 6 hours while n.p.o. -Will check A1c.  5-depression -No suicidal ideation or hallucinations. -Will resume antidepressant home regimen while taking n.p.o.  6-essential hypertension -Will follow vital signs -IV hydralazine as needed while n.p.o. to control blood pressure.  7-GASTROESOPHAGEAL REFLUX DISEASE -Started on IV Pepcid  8-Emphysema with chronic bronchitis (HCC) -No shortness of breath and good oxygen saturation on room air. -Will follow breathing status and use as needed DuoNeb.    9-Hydronephrosis of right kidney -chronic and stable -no dysuria or signs elevated in Cr.   DVT prophylaxis: heparin   Code Status: Full Family Communication: no  family at bedside  Disposition Plan: anticipate discharge back home once SBO resolved and able to tolerate PO's.  Consults called: general surgery  Admission status: inpatient, LOS > 2 midnights, med-surg    Time Spent: 70 minutes  Barton Dubois MD [facility] Pager [phone]  If 7PM-7AM, please contact night-coverage [email or web address] Password [facility]  11/01/2017, 9:34 AM

## 2017-11-01 NOTE — ED Provider Notes (Signed)
[facility] EMERGENCY DEPARTMENT Provider Note   CSN: [id number] Arrival date & time: 11/01/17  0408  Time seen 04:30 AM   History   Chief Complaint Chief Complaint  Patient presents with  . Abdominal Pain    HPI Carla [name] is a 72 y.o. female.  HPI patient is here for her husband.  When I asked her when her abdomen started hurting she stated "most of the time" and "a pretty good while".  They then said 1 week and later on said it has been there at least 3 months.  She states she has never discussed it with her doctor.  Today they state it got worse after lunch.  They indicated they did not eat today.  She states the pain is in her left lower abdomen.  During the course of my interview she yelled at her husband that "you do not have to tell her everything".  He indicates she had nausea and vomiting at least 3-4 times that started about 9:30 PM and the last episode was about 30 minutes prior to EMS arrival.  He states it was brown liquid.  They deny diarrhea or fever.  Patient has had prior abdominal surgeries.  She states she has abdominal pain all the time and describes it is aching.  She denies abdominal bloating, dysuria, and may be has some frequency.  Husband states they had company this afternoon and she seemed fine.  PCP Fayrene Helper, MD   Past Medical History:  Diagnosis Date  . ASCVD (arteriosclerotic cardiovascular disease)    No critical disease on cath in 8/97: mild AI with trival, if any l AS; negative stress nuclear in 2010  . Back pain   . Cervical disc herniation   . Chronic bronchitis   . Colon polyps   . Depression   . Elevated hemoglobin A1c   . Fasting hyperglycemia   . GERD (gastroesophageal reflux disease)   . HSV (herpes simplex virus) infection   . Hydronephrosis   . Hypertension   . Osteoporosis   . Peripheral vascular disease ([place])    stauts post aortabifemoral graft    . Secondary erythrocytosis 08/05/2012   Secondary to COPD  .  Tobacco abuse    has stopped    Patient Active Problem List   Diagnosis Date Noted  . MCI (mild cognitive impairment) with memory loss 01/27/2017  . De Quervain's disease (radial styloid tenosynovitis) 10/14/2016  . Atrophic vaginitis 04/18/2016  . Dysuria 04/18/2016  . Onychomycosis 07/08/2015  . Western blot positive HSV2 02/12/2015  . GAD (generalized anxiety disorder) 02/13/2014  . Well controlled type 2 diabetes mellitus with peripheral circulatory disorder ([place]) 03/31/2013  . Seasonal allergies 03/30/2013  . Cervical neck pain with evidence of disc disease 11/03/2012  . Secondary erythrocytosis 08/05/2012  . Encounter for chronic pain management 04/14/2012  . Hearing loss 04/13/2012  . Carotid bruit 04/13/2012  . Back pain with radiation 10/15/2011  . Emphysema with chronic bronchitis ([place]) 01/17/2011  . Arteriosclerotic cardiovascular disease (ASCVD) 09/17/2009  . PERIPHERAL VASCULAR DISEASE 09/17/2009  . GASTROESOPHAGEAL REFLUX DISEASE 09/17/2009  . Primary hyperparathyroidism ([place]) 01/20/2009  . Vitamin D deficiency 01/20/2009  . HYPERCALCEMIA 12/30/2008  . Hyperlipidemia LDL goal <100 06/12/2007  . Depression 06/12/2007  . Essential hypertension 06/12/2007  . Osteoporosis 06/12/2007    Past Surgical History:  Procedure Laterality Date  . ABDOMINAL HYSTERECTOMY    . aorta bifem graft    . CHOLECYSTECTOMY    . COLONOSCOPY  06/23/2006   Dr. Fields:Normal colon without evidence of polyps, masses, inflammatory changes/Normal retroflex view of the rectum  . COLONOSCOPY N/A 08/24/2013   Procedure: COLONOSCOPY;  Surgeon: Danie Binder, MD;  Location: [facility] ENDO SUITE;  Service: Endoscopy;  Laterality: N/A;  10:30-moved to [place] notified pt  . lysis of adhesions  2000  . PARTIAL HYSTERECTOMY    . right kidney surgery following damage during arterial surgery    . TOTAL ABDOMINAL HYSTERECTOMY W/ BILATERAL SALPINGOOPHORECTOMY  1975     OB History   None       Home Medications    Prior to Admission medications   Medication Sig Start Date End Date Taking? Authorizing Provider  acyclovir (ZOVIRAX) 800 MG tablet TAKE ONE TABLET BY MOUTH 2 TIMES DAILY. 03/20/17   Fayrene Helper, MD  alendronate (FOSAMAX) 70 MG tablet TAKE 1 TABLET EACH WEEK 30 MIN BEFORE BREAKFAST WITH 8 OUNCES OF WATER FOR OSTEOPOROSIS. 06/05/15   Fayrene Helper, MD  ALPRAZolam Duanne Moron) 0.5 MG tablet Take 1 tablet (0.5 mg total) by mouth 2 (two) times daily. 06/12/17   Fayrene Helper, MD  amLODipine (NORVASC) 10 MG tablet TAKE ONE TABLET BY MOUTH ONCE DAILY. 10/16/17   Fayrene Helper, MD  aspirin EC 81 MG tablet Take 81 mg by mouth daily.    [provider]  benzonatate (TESSALON) 100 MG capsule TAKE 1 CAPSULE BY MOUTH TWICE DAILY AS NEEDED FOR COUGH. 09/15/17   Fayrene Helper, MD  benzonatate (TESSALON) 100 MG capsule TAKE 1 CAPSULE BY MOUTH TWICE DAILY AS NEEDED FOR COUGH. 09/29/17   Fayrene Helper, MD  FLUoxetine (PROZAC) 10 MG tablet Take 1 tablet (10 mg total) by mouth daily. 12/11/16   Fayrene Helper, MD  gabapentin (NEURONTIN) 100 MG capsule TAKE (2) CAPSULES BY MOUTH AT BEDTIME. 09/29/17   Fayrene Helper, MD  HYDROcodone-acetaminophen [facility]) 10-325 MG tablet One tablet twice daily 06/12/17   Fayrene Helper, MD  HYDROcodone-acetaminophen [facility]) 10-325 MG tablet One tablet two times daily for back pain 10/27/17 11/26/17  Fayrene Helper, MD  HYDROcodone-acetaminophen [facility]) 10-325 MG tablet One tablet two times daily for pain 11/27/17 12/27/17  Fayrene Helper, MD  montelukast (SINGULAIR) 10 MG tablet TAKE (1) TABLET BY MOUTH AT BEDTIME. 08/26/17   Fayrene Helper, MD  Multiple Vitamin (MULTIVITAMIN WITH MINERALS) TABS Take 1 tablet by mouth every evening.     [provider]  rivastigmine (EXELON) 4.6 mg/24hr APPLY 1 PATCH TO THE SKIN DAILY. DISCARD USED PATCH. 10/03/17   Fayrene Helper, MD  rosuvastatin (CRESTOR)  40 MG tablet Take 1 tablet (40 mg total) by mouth daily. 09/12/17   Fayrene Helper, MD    Family History Family History  Problem Relation Age of Onset  . Heart attack Father   . COPD Sister   . Heart disease Sister   . Diabetes Sister   . Coronary artery disease Sister   . Liver cancer Sister 39  . Diabetes Sister   . Diabetes Sister   . COPD Sister   . Colon cancer Neg Hx     Social History Social History   Tobacco Use  . Smoking status: Former Smoker    Packs/day: 3.00    Years: 50.00    Pack years: 150.00    Types: Cigarettes    Last attempt to quit: 12/12/2010    Years since quitting: 6.8  . Smokeless tobacco: Never Used  Substance  Use Topics  . Alcohol use: No  . Drug use: No  lives at home Lives with spouse   Allergies   Aricept [donepezil hcl] and Calcium-containing compounds   Review of Systems Review of Systems  All other systems reviewed and are negative.    Physical Exam Updated Vital Signs BP (!) 158/89   Pulse 91   Temp 98.6 F (37 C) (Oral)   Resp 18   Ht 5\' 3"  (1.6 m)   Wt 61.2 kg   SpO2 93%   BMI 23.91 kg/m   Physical Exam  Constitutional: She is oriented to person, place, and time. She appears well-developed and well-nourished.  Non-toxic appearance. She does not appear ill. No distress.  HENT:  Head: Normocephalic and atraumatic.  Right Ear: External ear normal.  Left Ear: External ear normal.  Nose: Nose normal. No mucosal edema or rhinorrhea.  Mouth/Throat: Oropharynx is clear and moist and mucous membranes are normal. No dental abscesses or uvula swelling.  Eyes: Pupils are equal, round, and reactive to light. Conjunctivae and EOM are normal.  Neck: Normal range of motion and full passive range of motion without pain. Neck supple.  Cardiovascular: Normal rate, regular rhythm and normal heart sounds. Exam reveals no gallop and no friction rub.  No murmur heard. Pulmonary/Chest: Effort normal and breath sounds normal. No  respiratory distress. She has no wheezes. She has no rhonchi. She has no rales. She exhibits no tenderness and no crepitus.  Abdominal: Soft. Normal appearance and bowel sounds are normal. She exhibits no distension. There is no tenderness. There is no rebound and no guarding.    Although patient points to her left lower quadrant as to being where her pain is located she appears to be most painful in the suprapubic area on exam.  Musculoskeletal: Normal range of motion. She exhibits no edema or tenderness.  Moves all extremities well.   Neurological: She is alert and oriented to person, place, and time. She has normal strength. No cranial nerve deficit.  Skin: Skin is warm, dry and intact. No rash noted. No erythema. No pallor.  Psychiatric: Her speech is normal. Her affect is labile. She is agitated.  Nursing note and vitals reviewed.    ED Treatments / Results  Labs (all labs ordered are listed, but only abnormal results are displayed) Results for orders placed or performed during the hospital encounter of 11/01/17  Comprehensive metabolic panel  Result Value Ref Range   Sodium 142 135 - 145 mmol/L   Potassium 3.1 (L) 3.5 - 5.1 mmol/L   Chloride 109 98 - 111 mmol/L   CO2 24 22 - 32 mmol/L   Glucose, Bld 165 (H) 70 - 99 mg/dL   BUN 18 8 - 23 mg/dL   Creatinine, Ser 0.97 0.44 - 1.00 mg/dL   Calcium 9.1 8.9 - 10.3 mg/dL   Total Protein 6.8 6.5 - 8.1 g/dL   Albumin 3.8 3.5 - 5.0 g/dL   AST 25 15 - 41 U/L   ALT 16 0 - 44 U/L   Alkaline Phosphatase 81 38 - 126 U/L   Total Bilirubin 0.6 0.3 - 1.2 mg/dL   GFR calc non Af Amer 57 (L) >60 mL/min   GFR calc Af Amer >60 >60 mL/min   Anion gap 9 5 - 15  Lipase, blood  Result Value Ref Range   Lipase 27 11 - 51 U/L  CBC with Differential  Result Value Ref Range   WBC 11.3 (H) 4.0 -  10.5 K/uL   RBC 4.51 3.87 - 5.11 MIL/uL   Hemoglobin 13.6 12.0 - 15.0 g/dL   HCT 42.0 36.0 - 46.0 %   MCV 93.1 78.0 - 100.0 fL   MCH 30.2 26.0 - 34.0 pg    MCHC 32.4 30.0 - 36.0 g/dL   RDW 13.5 11.5 - 15.5 %   Platelets 232 150 - 400 K/uL   Neutrophils Relative % 87 %   Neutro Abs 9.8 (H) 1.7 - 7.7 K/uL   Lymphocytes Relative 10 %   Lymphs Abs 1.1 0.7 - 4.0 K/uL   Monocytes Relative 3 %   Monocytes Absolute 0.4 0.1 - 1.0 K/uL   Eosinophils Relative 0 %   Eosinophils Absolute 0.0 0.0 - 0.7 K/uL   Basophils Relative 0 %   Basophils Absolute 0.0 0.0 - 0.1 K/uL   Laboratory interpretation all normal except leukocytosis, hypokalemia    EKG None  Radiology Ct Abdomen Pelvis W Contrast  Result Date: 11/01/2017 CLINICAL DATA:  Nausea, vomiting common diarrhea. Lower abdominal distention. EXAM: CT ABDOMEN AND PELVIS WITH CONTRAST TECHNIQUE: Multidetector CT imaging of the abdomen and pelvis was performed using the standard protocol following bolus administration of intravenous contrast. CONTRAST:  193mL ISOVUE-300 IOPAMIDOL (ISOVUE-300) INJECTION 61% COMPARISON:  July 07, 2013 FINDINGS: Lower chest: There is a tiny nodule in the right lung on series 4, image 2, unchanged since 2015, of no significance. Lung bases are otherwise normal. There is a small fluid containing hiatal hernia. The lower chest is otherwise unremarkable. Hepatobiliary: Previous cholecystectomy. Common bile duct dilatation is stable since 2015 with no internal filling defects identified. Portal vein is patent. No liver masses noted. Pancreas: Unremarkable. No pancreatic ductal dilatation or surrounding inflammatory changes. Spleen: Normal in size without focal abnormality. Adrenals/Urinary Tract: Adrenal glands are normal. Right hydronephrosis due to a UPJ obstruction is stable since 2015. A fat containing mass in the inferior right kidney is consistent with an angio myelolipoma. No suspicious masses on the right. There is mild pelvicaliectasis on the left which is more prominent since 2015. However, the left ureter is normal in caliber along its entire length with no stones. The  bladder is normal. Stomach/Bowel: The stomach is fluid-filled and distended. Most of the jejunum is distended consistent with obstruction. The transition point is seen on axial image 62 and coronal image 45. There is fecal material in the small bowel just proximal to the obstruction. Several loops of small bowel distal to the obstruction are thick walled with adjacent stranding. The most distal small bowel is decompressed and normal in appearance. Colonic diverticulosis is seen without diverticulitis. No definitive colonic abnormality is noted. The appendix is not visualized but there is no secondary evidence of appendicitis. Vascular/Lymphatic: Atherosclerotic changes are seen within the nonaneurysmal aorta and its branching vessels. No adenopathy. Reproductive: Status post hysterectomy. No adnexal masses. Other: There is a small amount of free fluid in the pelvis and mesentery, likely reactive to the bowel obstruction and inflamed loops of small bowel. No free air or pneumatosis identified. Musculoskeletal: No acute or significant osseous findings. IMPRESSION: 1. There is a small bowel obstruction. The transition point is visualized within the central pelvis either in the proximal ileum or distal jejunum. Multiple loops of small bowel distal to the obstruction are thick walled with adjacent stranding/inflammation. The thick walled loops of small bowel likely represent an underlying enteritis which could be infectious, inflammatory, or ischemic. Recommend clinical correlation. 2. Mild left pelvicaliectasis, increased since 2015 with no  underlying cause identified. No ureteral stones. 3. Persistent moderate right hydronephrosis due to a right UVJ obstruction. 4. Atherosclerotic changes in the aorta and branching vessels. Electronically Signed   By: Dorise Bullion III M.D   On: 11/01/2017 07:16    Procedures Procedures (including critical care time)  Medications Ordered in ED Medications  fentaNYL  (SUBLIMAZE) injection 50 mcg (has no administration in time range)  ciprofloxacin (CIPRO) IVPB 400 mg (has no administration in time range)  metroNIDAZOLE (FLAGYL) IVPB 500 mg (has no administration in time range)  lidocaine (XYLOCAINE) 2 % jelly (has no administration in time range)  ondansetron (ZOFRAN) injection 4 mg (4 mg Intravenous Given 11/01/17 0455)  sodium chloride 0.9 % bolus 1,000 mL (0 mLs Intravenous Stopped 11/01/17 0556)  sodium chloride 0.9 % bolus 500 mL (0 mLs Intravenous Stopped 11/01/17 0618)  dicyclomine (BENTYL) injection 20 mg (20 mg Intramuscular Given 11/01/17 0454)  iopamidol (ISOVUE-300) 61 % injection 100 mL (100 mLs Intravenous Contrast Given 11/01/17 0640)     Initial Impression / Assessment and Plan / ED Course  I have reviewed the triage vital signs and the nursing notes.  Pertinent labs & imaging results that were available during my care of the patient were reviewed by me and considered in my medical decision making (see chart for details).   I reviewed patient's prior imaging studies her last CT the abdomen pelvis was in April 2015 when she had colitis with inflammation of the left colon.  She states this feels the same.  Patient was given IV fluids, nausea medication and given Bentyl IM.  CTs of the abdomen and pelvis was ordered.  Patient was complaining of pain of her legs and was wanting pain medication for that.  She did not request pain medicine for her abdominal pain.  When patient CT scan resulted showing small bowel obstruction, general surgery was consulted.   07:30 AM patient and her husband were informed of her CT results and need for admission.  Although I had thoroughly explained to them that she would need to have bowel rest to hopefully prevent her having to have surgery patient started repeatedly asking me for something to drink.  Nursing staff is placing her NG tube.  07:52 AM Dr Dyann Kief, hospitalist, will admit.   Review of the [facility] shows patient gets #60 hydrocodone 10/325 monthly last filled August 5 and #60 alprazolam 0.5 mg tablets monthly last filled August 2 from her primary care doctor.   Final Clinical Impressions(s) / ED Diagnoses   Final diagnoses:  SBO (small bowel obstruction) (H[facility])  Enteritis  Nausea and vomiting, intractability of vomiting not specified, unspecified vomiting type    Plan admission  Rolland Porter, MD, Barbette Or, MD 11/01/17 [id number]

## 2017-11-01 NOTE — ED Notes (Signed)
Multiple attempts to start NG tube. Patient has not tolerated placement. Nasal passages are swollen and inflamed.   Was able to start OG tube. Patient tolerating right now. Have advised Floor nurse that patient will need to have NG placement once nasal inflammation has resolved.

## 2017-11-01 NOTE — Progress Notes (Signed)
Patients husband brought in medications from home :  Singlulair, amlodipine, gabapentin, rosuvastatin, and exelon patch.  Each of these meds and doses are already listed in PTA list correctly.

## 2017-11-01 NOTE — Consult Note (Addendum)
[facility] Consult  Reason for Consult: SBO, thickened bowel ? Enteritis  Referring Physician:  Dr. Tomi Bamberger and Dr. Dyann Kief   Chief Complaint    Abdominal Pain      TANNER VIGNA is a 72 y.o. female.  HPI: Ms. Lague is a 72 yo who was admitted to the hospital with concern for SBO with thickened small bowel concerning for infectious/ inflammatory process.  She is not the best historian and is accompanied by her sister in law.  She reports a 3 month+ history of some abdominal pain that has been generalized in nature. She says she has some issues with constipation and has taken stool softeners in the past, and that this last week she actually had BMs daily without issue. She denies any prior mucus or blood in her stools or prior, and had a colonoscopy in 2015 that demonstrated some polyps and an angulated sigmoid colon.  She denies any personal or family history of Crohn's / IBD. Her last sick contact was over 1 month ago when her grandchildren were sick with a GI bug.   She says that she has never had a SBO prior, but on reviewing her chart, there is a CT from 2010 where she had a SBO with again thickened small bowel.  She has had multiple abdominal procedures including a open aortobifemoral bypass, a open cholecystectomy, an open appy based on her scar (and lack of appendix on CT), and an open hysterectomy.    She reports that her last BM was Thursday and that she had some flatus today.  She had some nausea prior to coming to the ED but denies vomiting. She did vomit however with the NG attempts in the ER, and has actually had an OG placed and has minimal output at this time in the canister.   Past Medical History:  Diagnosis Date  . ASCVD (arteriosclerotic cardiovascular disease)    No critical disease on cath in 8/97: mild AI with trival, if any l AS; negative stress nuclear in 2010  . Back pain   . Cervical disc herniation   . Chronic bronchitis   . Colon polyps   .  Depression   . Elevated hemoglobin A1c   . Fasting hyperglycemia   . GERD (gastroesophageal reflux disease)   . HSV (herpes simplex virus) infection   . Hydronephrosis   . Hypertension   . Osteoporosis   . Peripheral vascular disease ([place])    stauts post aortabifemoral graft    . Secondary erythrocytosis 08/05/2012   Secondary to COPD  . Tobacco abuse    has stopped    Past Surgical History:  Procedure Laterality Date  . ABDOMINAL HYSTERECTOMY    . aorta bifem graft    . CHOLECYSTECTOMY    . COLONOSCOPY  06/23/2006   Dr. Fields:Normal colon without evidence of polyps, masses, inflammatory changes/Normal retroflex view of the rectum  . COLONOSCOPY N/A 08/24/2013   Procedure: COLONOSCOPY;  Surgeon: Danie Binder, MD;  Location: [facility];  Service: Endoscopy;  Laterality: N/A;  10:30-moved to [place] notified pt  . lysis of adhesions  2000  . PARTIAL HYSTERECTOMY    . right kidney surgery following damage during arterial surgery    . TOTAL ABDOMINAL HYSTERECTOMY W/ BILATERAL SALPINGOOPHORECTOMY  1975    Family History  Problem Relation Age of Onset  . Heart attack Father   . COPD Sister   . Heart disease Sister   . Diabetes Sister   .  Coronary artery disease Sister   . Liver cancer Sister 73  . Diabetes Sister   . Diabetes Sister   . COPD Sister   . Colon cancer Neg Hx     Social History   Tobacco Use  . Smoking status: Former Smoker    Packs/day: 3.00    Years: 50.00    Pack years: 150.00    Types: Cigarettes    Last attempt to quit: 12/12/2010    Years since quitting: 6.8  . Smokeless tobacco: Never Used  Substance Use Topics  . Alcohol use: No  . Drug use: No    Medications:  I have reviewed the patient's current medications. Prior to Admission:  Medications Prior to Admission  Medication Sig Dispense Refill Last Dose  . acyclovir (ZOVIRAX) 800 MG tablet TAKE ONE TABLET BY MOUTH 2 TIMES DAILY. 60 tablet 0 Taking  . alendronate (FOSAMAX) 70 MG  tablet TAKE 1 TABLET EACH WEEK 30 MIN BEFORE BREAKFAST WITH 8 OUNCES OF WATER FOR OSTEOPOROSIS. 4 tablet 4 Taking  . ALPRAZolam (XANAX) 0.5 MG tablet Take 1 tablet (0.5 mg total) by mouth 2 (two) times daily. 60 tablet 4 Taking  . amLODipine (NORVASC) 10 MG tablet TAKE ONE TABLET BY MOUTH ONCE DAILY. 90 tablet 0   . aspirin EC 81 MG tablet Take 81 mg by mouth daily.   Taking  . benzonatate (TESSALON) 100 MG capsule TAKE 1 CAPSULE BY MOUTH TWICE DAILY AS NEEDED FOR COUGH. 14 capsule 0   . benzonatate (TESSALON) 100 MG capsule TAKE 1 CAPSULE BY MOUTH TWICE DAILY AS NEEDED FOR COUGH. 14 capsule 0   . FLUoxetine (PROZAC) 10 MG tablet Take 1 tablet (10 mg total) by mouth daily. 90 tablet 2 Taking  . gabapentin (NEURONTIN) 100 MG capsule TAKE (2) CAPSULES BY MOUTH AT BEDTIME. 60 capsule 0   . HYDROcodone-acetaminophen (NORCO) 10-325 MG tablet One tablet twice daily 60 tablet 0 Taking  . HYDROcodone-acetaminophen (NORCO) 10-325 MG tablet One tablet two times daily for back pain 60 tablet 0   . [START ON 11/27/2017] HYDROcodone-acetaminophen (NORCO) 10-325 MG tablet One tablet two times daily for pain 60 tablet 0   . montelukast (SINGULAIR) 10 MG tablet TAKE (1) TABLET BY MOUTH AT BEDTIME. 30 tablet 0 Taking  . Multiple Vitamin (MULTIVITAMIN WITH MINERALS) TABS Take 1 tablet by mouth every evening.    Taking  . rivastigmine (EXELON) 4.6 mg/24hr APPLY 1 PATCH TO THE SKIN DAILY. DISCARD USED PATCH. 30 patch 3   . rosuvastatin (CRESTOR) 40 MG tablet Take 1 tablet (40 mg total) by mouth daily. 30 tablet 5    Scheduled: . Benzocaine      . heparin  5,000 Units Subcutaneous Q8H  . insulin aspart  0-9 Units Subcutaneous Q6H  . lidocaine      . lidocaine      . sodium chloride flush  3 mL Intravenous Q12H   Continuous: . 0.9 % NaCl with KCl 40 mEq / L 75 mL/hr (11/01/17 1236)  . ciprofloxacin 400 mg (11/01/17 1237)  . famotidine (PEPCID) IV 20 mg (11/01/17 1236)  . metronidazole 500 mg (11/01/17 1238)    DEY:[id number], ipratropium-albuterol, ondansetron **OR** ondansetron (ZOFRAN) IV, phenol  Allergies  Allergen Reactions  . Aricept [Donepezil Hcl]     diarrheah  . Calcium-Containing Compounds Other (See Comments)    Pt has hyperparathyroidism which results in hypercalcemia     ROS:  A comprehensive review of systems was negative except for: Gastrointestinal: positive for  abdominal pain, constipation, nausea and no mucus or blood in stools  Blood pressure (!) 142/81, pulse 88, temperature 98.9 F (37.2 C), temperature source Oral, resp. rate 16, height '5\' 3"'  (1.6 m), weight 60 kg, SpO2 93 %. Physical Exam  Constitutional: She is oriented to person, place, and time. She appears well-developed and well-nourished.  HENT:  Head: Normocephalic and atraumatic.  OG in place, difficult for patient to speak and uncomfortable, minimal output  Eyes: Pupils are equal, round, and reactive to light.  Cardiovascular: Normal rate.  Pulmonary/Chest: Effort normal.  Abdominal: Normal appearance. There is generalized tenderness. There is no rigidity, no rebound and no guarding.  Minimal generalized tenderness with deep palpation, well healed midline scar, well healed open chole and open appy scars  Musculoskeletal: Normal range of motion.  No edema  Neurological: She is alert and oriented to person, place, and time.  Skin: Skin is warm and dry.  Psychiatric: She has a normal mood and affect. Her behavior is normal.  Vitals reviewed.   Results: Results for orders placed or performed during the hospital encounter of 11/01/17 (from the past 48 hour(s))  Comprehensive metabolic panel     Status: Abnormal   Collection Time: 11/01/17  5:26 AM  Result Value Ref Range   Sodium 142 135 - 145 mmol/L   Potassium 3.1 (L) 3.5 - 5.1 mmol/L   Chloride 109 98 - 111 mmol/L   CO2 24 22 - 32 mmol/L   Glucose, Bld 165 (H) 70 - 99 mg/dL   BUN 18 8 - 23 mg/dL   Creatinine, Ser 0.97 0.44 - 1.00 mg/dL    Calcium 9.1 8.9 - 10.3 mg/dL   Total Protein 6.8 6.5 - 8.1 g/dL   Albumin 3.8 3.5 - 5.0 g/dL   AST 25 15 - 41 U/L   ALT 16 0 - 44 U/L   Alkaline Phosphatase 81 38 - 126 U/L   Total Bilirubin 0.6 0.3 - 1.2 mg/dL   GFR calc non Af Amer 57 (L) >60 mL/min   GFR calc Af Amer >60 >60 mL/min    Comment: (NOTE) The eGFR has been calculated using the CKD EPI equation. This calculation has not been validated in all clinical situations. eGFR's persistently <60 mL/min signify possible Chronic Kidney Disease.    Anion gap 9 5 - 15    Comment: Performed at [facility], [address], [place], [place] [address]  Lipase, blood     Status: None   Collection Time: 11/01/17  5:26 AM  Result Value Ref Range   Lipase 27 11 - 51 U/L    Comment: Performed at [facility], [address] [place],  [address]  CBC with Differential     Status: Abnormal   Collection Time: 11/01/17  5:26 AM  Result Value Ref Range   WBC 11.3 (H) 4.0 - 10.5 K/uL   RBC 4.51 3.87 - 5.11 MIL/uL   Hemoglobin 13.6 12.0 - 15.0 g/dL   HCT 42.0 36.0 - 46.0 %   MCV 93.1 78.0 - 100.0 fL   MCH 30.2 26.0 - 34.0 pg   MCHC 32.4 30.0 - 36.0 g/dL   RDW 13.5 11.5 - 15.5 %   Platelets 232 150 - 400 K/uL   Neutrophils Relative % 87 %   Neutro Abs 9.8 (H) 1.7 - 7.7 K/uL   Lymphocytes Relative 10 %   Lymphs Abs 1.1 0.7 - 4.0 K/uL   Monocytes Relative 3 %   Monocytes Absolute 0.4  0.1 - 1.0 K/uL   Eosinophils Relative 0 %   Eosinophils Absolute 0.0 0.0 - 0.7 K/uL   Basophils Relative 0 %   Basophils Absolute 0.0 0.0 - 0.1 K/uL    Comment: Performed at [facility], [address], [place], [place] [address]  Magnesium     Status: None   Collection Time: 11/01/17  5:26 AM  Result Value Ref Range   Magnesium 2.3 1.7 - 2.4 mg/dL    Comment: Performed at [facility], [address], [place], [place] [address]  Urinalysis, Routine w reflex microscopic     Status: Abnormal   Collection Time: 11/01/17  7:00 AM  Result Value Ref  Range   Color, Urine COLORLESS (A) YELLOW   APPearance CLEAR CLEAR   Specific Gravity, Urine 1.012 1.005 - 1.030   pH 9.0 (H) 5.0 - 8.0   Glucose, UA NEGATIVE NEGATIVE mg/dL   Hgb urine dipstick SMALL (A) NEGATIVE   Bilirubin Urine NEGATIVE NEGATIVE   Ketones, ur NEGATIVE NEGATIVE mg/dL   Protein, ur NEGATIVE NEGATIVE mg/dL   Nitrite NEGATIVE NEGATIVE   Leukocytes, UA NEGATIVE NEGATIVE   RBC / HPF 0-5 0 - 5 RBC/hpf   WBC, UA 0-5 0 - 5 WBC/hpf   Bacteria, UA NONE SEEN NONE SEEN    Comment: Performed at [facility], [address], [place], [place] [address]  Glucose, capillary     Status: Abnormal   Collection Time: 11/01/17 11:36 AM  Result Value Ref Range   Glucose-Capillary 137 (H) 70 - 99 mg/dL  Lactic acid, plasma     Status: None   Collection Time: 11/01/17 11:56 AM  Result Value Ref Range   Lactic Acid, Venous 1.7 0.5 - 1.9 mmol/L    Comment: Performed at [facility], [address] [place], [place] [address]  Phosphorus     Status: None   Collection Time: 11/01/17 11:56 AM  Result Value Ref Range   Phosphorus 2.7 2.5 - 4.6 mg/dL    Comment: Performed at [facility], [address] [place], [place] [address]    Personally reviewed CT and also reviewed prior CT - esp one from 2010  -SBO with fecalized SB that is dilated proximally to area of thickened bowel with tapered transition, similar to 2010 but can see oral  Contrast in 2010   Ct Abdomen Pelvis W Contrast  Result Date: 11/01/2017 CLINICAL DATA:  Nausea, vomiting common diarrhea. Lower abdominal distention. EXAM: CT ABDOMEN AND PELVIS WITH CONTRAST TECHNIQUE: Multidetector CT imaging of the abdomen and pelvis was performed using the standard protocol following bolus administration of intravenous contrast. CONTRAST:  167m ISOVUE-300 IOPAMIDOL (ISOVUE-300) INJECTION 61% COMPARISON:  July 07, 2013 FINDINGS: Lower chest: There is a tiny nodule in the right lung on series 4, image 2, unchanged since 2015, of no  significance. Lung bases are otherwise normal. There is a small fluid containing hiatal hernia. The lower chest is otherwise unremarkable. Hepatobiliary: Previous cholecystectomy. Common bile duct dilatation is stable since 2015 with no internal filling defects identified. Portal vein is patent. No liver masses noted. Pancreas: Unremarkable. No pancreatic ductal dilatation or surrounding inflammatory changes. Spleen: Normal in size without focal abnormality. Adrenals/Urinary Tract: Adrenal glands are normal. Right hydronephrosis due to a UPJ obstruction is stable since 2015. A fat containing mass in the inferior right kidney is consistent with an angio myelolipoma. No suspicious masses on the right. There is mild pelvicaliectasis on the left which is more prominent since 2015. However, the  left ureter is normal in caliber along its entire length with no stones. The bladder is normal. Stomach/Bowel: The stomach is fluid-filled and distended. Most of the jejunum is distended consistent with obstruction. The transition point is seen on axial image 62 and coronal image 45. There is fecal material in the small bowel just proximal to the obstruction. Several loops of small bowel distal to the obstruction are thick walled with adjacent stranding. The most distal small bowel is decompressed and normal in appearance. Colonic diverticulosis is seen without diverticulitis. No definitive colonic abnormality is noted. The appendix is not visualized but there is no secondary evidence of appendicitis. Vascular/Lymphatic: Atherosclerotic changes are seen within the nonaneurysmal aorta and its branching vessels. No adenopathy. Reproductive: Status post hysterectomy. No adnexal masses. Other: There is a small amount of free fluid in the pelvis and mesentery, likely reactive to the bowel obstruction and inflamed loops of small bowel. No free air or pneumatosis identified. Musculoskeletal: No acute or significant osseous findings.  IMPRESSION: 1. There is a small bowel obstruction. The transition point is visualized within the central pelvis either in the proximal ileum or distal jejunum. Multiple loops of small bowel distal to the obstruction are thick walled with adjacent stranding/inflammation. The thick walled loops of small bowel likely represent an underlying enteritis which could be infectious, inflammatory, or ischemic. Recommend clinical correlation. 2. Mild left pelvicaliectasis, increased since 2015 with no underlying cause identified. No ureteral stones. 3. Persistent moderate right hydronephrosis due to a right UVJ obstruction. 4. Atherosclerotic changes in the aorta and branching vessels. Electronically Signed   By: Dorise Bullion III M.D   On: 11/01/2017 07:16   Dg Chest Portable 1 View  Result Date: 11/01/2017 CLINICAL DATA:  OG tube placement EXAM: PORTABLE CHEST 1 VIEW COMPARISON:  September 27, 2015 FINDINGS: The OG tube terminates in the region of the distal stomach. The heart, hila, mediastinum, lungs, and pleura are otherwise normal. IMPRESSION: OG tube terminates in the region of the distal stomach. Electronically Signed   By: Dorise Bullion III M.D   On: 11/01/2017 09:43    Assessment & Plan:  ZORIYAH SCHEIDEGGER is a 72 y.o. female with SBO that looks more chronic given the fecalization and her history of pain for three months and prior constipation issues, and also associated with thickened small bowel. She has had prior thickening of the SB in 2010 on CT.  She does not recall this admission and documentation in Epic is not dated back that far.  She has had a colonoscopy in 2015 but no visualization of the small bowel or reported need for it at that time.  Difficult to say if this is infectious versus inflammatory but given the similarities from 2010 I worry that inflammatory is more possible.  I removed her OG tube.  Her abdominal exam is reassuring and her labs are reassuring.   -NPO, can have a medicine cup of  ice / hour -Agree with antibiotics for now for the possible enteritis? But would get GI to weigh in on thoughts regarding IBD and additional workup in the future needed  -She would need at the least a follow up CT in the future to prove that this has resolved, and if does not follow up with GI can get with her PCP  -Multiple prior abdominal surgeries, has a reason for adhesive disease but this CT does not have that appearance given the thickened bowel, an operation for her would extensive given the history and  we would want to avoid given the other possible etiologies of the thickened bowel causing the SBO  -Will follow   All questions were answered to the satisfaction of the patient and family. Told RN removed OG. Would not replace unless vomits and can get an NG.   Discussed with Dr. Dyann Kief. Discussed with patient and sister in law.   Virl Cagey 11/01/2017, 12:40 PM

## 2017-11-01 NOTE — ED Notes (Signed)
Unable to pass nasal septum on both sides without significant resistance.  OG tube inserted without difficulty.  Pt tolerated well.

## 2017-11-02 DIAGNOSIS — K56609 Unspecified intestinal obstruction, unspecified as to partial versus complete obstruction: Principal | ICD-10-CM

## 2017-11-02 DIAGNOSIS — R112 Nausea with vomiting, unspecified: Secondary | ICD-10-CM

## 2017-11-02 LAB — GLUCOSE, CAPILLARY
GLUCOSE-CAPILLARY: 108 mg/dL — AB (ref 70–99)
Glucose-Capillary: 141 mg/dL — ABNORMAL HIGH (ref 70–99)

## 2017-11-02 LAB — BASIC METABOLIC PANEL
ANION GAP: 5 (ref 5–15)
BUN: 12 mg/dL (ref 8–23)
CHLORIDE: 114 mmol/L — AB (ref 98–111)
CO2: 24 mmol/L (ref 22–32)
Calcium: 8.8 mg/dL — ABNORMAL LOW (ref 8.9–10.3)
Creatinine, Ser: 0.83 mg/dL (ref 0.44–1.00)
GFR calc Af Amer: >60 mL/min (ref >60)
GFR calc non Af Amer: >60 mL/min (ref >60)
Glucose, Bld: 110 mg/dL — ABNORMAL HIGH (ref 70–99)
POTASSIUM: 3.7 mmol/L (ref 3.5–5.1)
SODIUM: 143 mmol/L (ref 135–145)

## 2017-11-02 LAB — CBC
HEMATOCRIT: 41.3 % (ref 36.0–46.0)
HEMOGLOBIN: 13 g/dL (ref 12.0–15.0)
MCH: 30 pg (ref 26.0–34.0)
MCHC: 31.5 g/dL (ref 30.0–36.0)
MCV: 95.2 fL (ref 78.0–100.0)
Platelets: 212 10*3/uL (ref 150–400)
RBC: 4.34 MIL/uL (ref 3.87–5.11)
RDW: 13.9 % (ref 11.5–15.5)
WBC: 8 10*3/uL (ref 4.0–10.5)

## 2017-11-02 MED ORDER — CIPROFLOXACIN HCL 500 MG PO TABS: 500.0000 mg | ORAL_TABLET | Freq: Two times a day (BID) | ORAL | 0 refills | Status: DC

## 2017-11-02 MED ORDER — SACCHAROMYCES BOULARDII 250 MG PO CAPS: 250.0000 mg | ORAL_CAPSULE | Freq: Two times a day (BID) | ORAL | 0 refills | Status: DC

## 2017-11-02 MED ORDER — METRONIDAZOLE IN NACL 5-0.79 MG/ML-% IV SOLN: 500.0000 mg | Freq: Three times a day (TID) | INTRAVENOUS | Status: DC

## 2017-11-02 MED ORDER — METRONIDAZOLE 500 MG PO TABS
500.0000 mg | ORAL_TABLET | Freq: Three times a day (TID) | ORAL | Status: DC
  Administered 2017-11-02: 500 mg via ORAL
  Filled 2017-11-02: qty 1

## 2017-11-02 MED ORDER — HYDROCODONE-ACETAMINOPHEN 10-325 MG PO TABS: 1.0000 | ORAL_TABLET | Freq: Two times a day (BID) | ORAL | Status: DC | PRN

## 2017-11-02 MED ORDER — METRONIDAZOLE 500 MG PO TABS: 500.0000 mg | ORAL_TABLET | Freq: Three times a day (TID) | ORAL | 0 refills | Status: AC

## 2017-11-02 MED ORDER — CIPROFLOXACIN HCL 250 MG PO TABS
500.0000 mg | ORAL_TABLET | Freq: Two times a day (BID) | ORAL | Status: DC
  Administered 2017-11-02: 500 mg via ORAL
  Filled 2017-11-02: qty 2

## 2017-11-02 NOTE — Progress Notes (Signed)
Patient had large loose BM last night, states she feels much better this AM.  Abdomen appears less distended and is soft, non-tender

## 2017-11-02 NOTE — Discharge Instructions (Signed)
Small Bowel Obstruction °A small bowel obstruction means that something is blocking the small bowel. The small bowel is also called the small intestine. It is the long tube that connects the stomach to the colon. An obstruction will stop food and fluids from passing through the small bowel. Treatment depends on what is causing the problem and how bad the problem is. °Follow these instructions at home: °· Get a lot of rest. °· Follow your diet as told by your doctor. You may need to: °? Only drink clear liquids until you start to get better. °? Avoid solid foods as told by your doctor. °· Take over-the-counter and prescription medicines only as told by your doctor. °· Keep all follow-up visits as told by your doctor. This is important. °Contact a doctor if: °· You have a fever. °· You have chills. °Get help right away if: °· You have pain or cramps that get worse. °· You throw up (vomit) blood. °· You have a feeling of being sick to your stomach (nausea) that does not go away. °· You cannot stop throwing up. °· You cannot drink fluids. °· You feel confused. °· You feel dry or thirsty (dehydrated). °· Your belly gets more bloated. °· You feel weak or you pass out (faint). °This information is not intended to replace advice given to you by your health care provider. Make sure you discuss any questions you have with your health care provider. °Document Released: 04/18/2004 Document Revised: 11/06/2015 Document Reviewed: 05/05/2014 °Elsevier Interactive Patient Education © 2018 [employer] ° °

## 2017-11-02 NOTE — Progress Notes (Signed)
Patient discharged home.  AVS reviewed with patient and husband.  Patient to call GI to schedule apt in AM.  Verbalizes understanding. No questions at this time.  Emphasized importance of completing abx.  Assisted off unit in NAD

## 2017-11-02 NOTE — Discharge Summary (Signed)
Physician Discharge Summary  Carla [name] BJY:[id number] DOB: 1945-06-04 DOA: 11/01/2017  PCP: Fayrene Helper, MD  Admit date: 11/01/2017 Discharge date: 11/02/2017  Time spent: 35 minutes  Recommendations for Outpatient Follow-up:  1. Repeat basic metabolic panel to follow electrolytes and renal function 2. Follow CBGs and A1c (which is pending at discharge) and determine the need to start hypoglycemic regimen.   Discharge Diagnoses:  Principal Problem:   SBO (small bowel obstruction) (HCC) Active Problems:   Hyperlipidemia LDL goal <100   Depression   Essential hypertension   GASTROESOPHAGEAL REFLUX DISEASE   Emphysema with chronic bronchitis (HCC)   Nausea and vomiting   Hypokalemia   Type 2 diabetes mellitus with hyperglycemia (HCC)   Hydronephrosis of right kidney   Enteritis   Discharge Condition: Stable and improved.  Patient will be discharged home with instruction to follow-up with PCP in 10 days and also to arrange outpatient follow-up with gastroenterology in 4 weeks.  Diet recommendation: heart healthy and modified carbohydrates diet.  Filed Weights   11/01/17 0409 11/01/17 0941  Weight: 61.2 kg 60 kg    History of present illness:  72 y.o. female with a past medical history significant for diabetes, gastroesophageal reflux disease, depression, hyperlipidemia, history of cholecystectomy and hysterectomy, and prior history of tobacco abuse with subsequent emphysema and chronic bronchitis; who presented to the emergency department with complaints of abdominal pain, nausea and vomiting.  Patient reports mid abdominal discomfort, crampy, 5 out of 10 in intensity, present for the last 6 to 8 weeks, worsening and with association of nausea.  No radiation nothing makes the pain better or worse.  2 days prior to admission she reported that this abdominal discomfort has intensified and she is having now also vomiting.  Last bowel movements was 2 days ago.  There has  not been any chest pain, shortness of breath, hematuria, dysuria, melena, hematochezia, hematemesis, focal weakness, palpitations, fever, chills or any other complaints.  Hospital Course:  1-abdominal pain, nausea/vomiting: In the setting of small bowel obstruction and enteritis. -Patient ended improvement with a hollow faster than anticipated and is at this moment asking to be discharged home. -Case has been discussed with general surgery who agrees that with patient improvement no surgery is anticipated at this moment. -Soft diet has been recommended and a course of 10 days of Cipro and Flagyl will be provided. -Patient will benefit of outpatient follow-up with gastroenterology to perform colonoscopy to further assess inflamed area seen on her CT scan (4 weeks, or so). -Keep herself well-hydrated and to increase fiber in her diet.  2-hypokalemia: -Magnesium within normal limits. -Electrolyte has been repleted and within normal range at discharge -Recommend basic metabolic panel to follow electrolytes trend  3-hyperlipidemia with an LDL goal of less than 100 -Will resume the use of statins. -will recommend Outpatient follow-up to lipid profile and LFTs  4-type 2 diabetes with hyperglycemia well-controlled currently following diet only.  Continue modified carbohydrate diet at discharge -A1c is still pending, outpatient follow-up for CBGs and A1c to further determine the need hypoglycemic regimen.  5-depression -No suicidal ideation or hallucination. -Continue home antidepressant/anxiolytic regimen.  6-essential hypertension -Blood pressure stable -Continue home antihypertensive regimen. -Patient advised to follow heart healthy diet.  7-gastroesophageal reflux disease -Will continue PPI.  8-emphysema with chronic bronchitis. -Good oxygen saturation on room air, no shortness of breath. -Patient is currently no wheezing. -Continue as needed inhaler.  9-hydronephrosis of right  kidney -This appears to be chronic and is  stable. -Patient denies any dysuria or any signs of elevated creatinine of her most recent blood work. -will recommend outpatient follow up.   Procedures:  See below for x-ray reports.  Consultations:  General surgery  Discharge Exam: Vitals:   11/02/17 0604 11/02/17 1349  BP: 126/64 (!) 130/58  Pulse: 69 68  Resp:  16  Temp: 98.3 F (36.8 C) 98.8 F (37.1 C)  SpO2: 96% 95%    General: Afebrile, no chest pain, no shortness of breath, no nausea, no vomiting.  Patient had large loose bowel movement last night and states that she feels a hollow better this morning.  She has been able to tolerate a full liquid diet and also soft diet.  Patient wants to go home. Cardiovascular: S1 and S2, no rubs, no gallops, no murmurs. Respiratory: Scattered rhonchi, no wheezing, no crackles. Abdomen: Soft, nontender, no distention, positive bowel sounds. Extremities: no edema, no cyanosis, no clubbing. Neurologic exam: No focal deficits, cranial nerve intact, following commands appropriately.  Discharge Instructions   Discharge Instructions    Diet - low sodium heart healthy   Complete by:  As directed    Discharge instructions   Complete by:  As directed    Keep yourself well-hydrated Follow a soft low residue diet  Arrange follow-up with PCP in 10 days Arrange follow-up with gastroenterology service in the next 2 to 4 weeks.     Allergies as of 11/02/2017      Reactions   Aricept [donepezil Hcl]    diarrheah   Calcium-containing Compounds Other (See Comments)   Pt has hyperparathyroidism which results in hypercalcemia      Medication List    TAKE these medications   alendronate 70 MG tablet Commonly known as:  FOSAMAX TAKE 1 TABLET EACH WEEK 30 MIN BEFORE BREAKFAST WITH 8 OUNCES OF WATER FOR OSTEOPOROSIS. What changed:  See the new instructions.   ALPRAZolam 0.5 MG tablet Commonly known as:  XANAX Take 1 tablet (0.5 mg total) by  mouth 2 (two) times daily.   amLODipine 10 MG tablet Commonly known as:  NORVASC TAKE ONE TABLET BY MOUTH ONCE DAILY.   aspirin EC 81 MG tablet Take 81 mg by mouth daily.   ciprofloxacin 500 MG tablet Commonly known as:  CIPRO Take 1 tablet (500 mg total) by mouth 2 (two) times daily.   gabapentin 100 MG capsule Commonly known as:  NEURONTIN TAKE (2) CAPSULES BY MOUTH AT BEDTIME. What changed:  See the new instructions.   HYDROcodone-acetaminophen 10-325 MG tablet Commonly known as:  NORCO Take 1-2 tablets by mouth every 12 (twelve) hours as needed for severe pain. One tablet two times daily for pain What changed:    how much to take  how to take this  when to take this  reasons to take this   metroNIDAZOLE 500 MG tablet Commonly known as:  FLAGYL Take 1 tablet (500 mg total) by mouth every 8 (eight) hours for 30 doses.   montelukast 10 MG tablet Commonly known as:  SINGULAIR TAKE (1) TABLET BY MOUTH AT BEDTIME. What changed:  See the new instructions.   multivitamin with minerals Tabs tablet Take 1 tablet by mouth every evening.   rivastigmine 4.6 mg/24hr Commonly known as:  EXELON APPLY 1 PATCH TO THE SKIN DAILY. DISCARD USED PATCH. What changed:  See the new instructions.   rosuvastatin 40 MG tablet Commonly known as:  CRESTOR Take 1 tablet (40 mg total) by mouth daily.   saccharomyces boulardii  250 MG capsule Commonly known as:  FLORASTOR Take 1 capsule (250 mg total) by mouth 2 (two) times daily.      Allergies  Allergen Reactions  . Aricept [Donepezil Hcl]     diarrheah  . Calcium-Containing Compounds Other (See Comments)    Pt has hyperparathyroidism which results in hypercalcemia   Follow-up Information    Fayrene Helper, MD. Schedule an appointment as soon as possible for a visit in 10 day(s).   Specialty:  Family Medicine Contact information: [address] [place] [address] [phone]        Danie Binder,  MD. Call.   Specialty:  Gastroenterology Why:  to set up appointment in 2-4 weeks  Contact information: [address] [place] [address] [phone]           The results of significant diagnostics from this hospitalization (including imaging, microbiology, ancillary and laboratory) are listed below for reference.    Significant Diagnostic Studies: Ct Abdomen Pelvis W Contrast  Result Date: 11/01/2017 CLINICAL DATA:  Nausea, vomiting common diarrhea. Lower abdominal distention. EXAM: CT ABDOMEN AND PELVIS WITH CONTRAST TECHNIQUE: Multidetector CT imaging of the abdomen and pelvis was performed using the standard protocol following bolus administration of intravenous contrast. CONTRAST:  169mL ISOVUE-300 IOPAMIDOL (ISOVUE-300) INJECTION 61% COMPARISON:  July 07, 2013 FINDINGS: Lower chest: There is a tiny nodule in the right lung on series 4, image 2, unchanged since 2015, of no significance. Lung bases are otherwise normal. There is a small fluid containing hiatal hernia. The lower chest is otherwise unremarkable. Hepatobiliary: Previous cholecystectomy. Common bile duct dilatation is stable since 2015 with no internal filling defects identified. Portal vein is patent. No liver masses noted. Pancreas: Unremarkable. No pancreatic ductal dilatation or surrounding inflammatory changes. Spleen: Normal in size without focal abnormality. Adrenals/Urinary Tract: Adrenal glands are normal. Right hydronephrosis due to a UPJ obstruction is stable since 2015. A fat containing mass in the inferior right kidney is consistent with an angio myelolipoma. No suspicious masses on the right. There is mild pelvicaliectasis on the left which is more prominent since 2015. However, the left ureter is normal in caliber along its entire length with no stones. The bladder is normal. Stomach/Bowel: The stomach is fluid-filled and distended. Most of the jejunum is distended consistent with obstruction. The transition  point is seen on axial image 62 and coronal image 45. There is fecal material in the small bowel just proximal to the obstruction. Several loops of small bowel distal to the obstruction are thick walled with adjacent stranding. The most distal small bowel is decompressed and normal in appearance. Colonic diverticulosis is seen without diverticulitis. No definitive colonic abnormality is noted. The appendix is not visualized but there is no secondary evidence of appendicitis. Vascular/Lymphatic: Atherosclerotic changes are seen within the nonaneurysmal aorta and its branching vessels. No adenopathy. Reproductive: Status post hysterectomy. No adnexal masses. Other: There is a small amount of free fluid in the pelvis and mesentery, likely reactive to the bowel obstruction and inflamed loops of small bowel. No free air or pneumatosis identified. Musculoskeletal: No acute or significant osseous findings. IMPRESSION: 1. There is a small bowel obstruction. The transition point is visualized within the central pelvis either in the proximal ileum or distal jejunum. Multiple loops of small bowel distal to the obstruction are thick walled with adjacent stranding/inflammation. The thick walled loops of small bowel likely represent an underlying enteritis which could be infectious, inflammatory, or ischemic. Recommend clinical  correlation. 2. Mild left pelvicaliectasis, increased since 2015 with no underlying cause identified. No ureteral stones. 3. Persistent moderate right hydronephrosis due to a right UVJ obstruction. 4. Atherosclerotic changes in the aorta and branching vessels. Electronically Signed   By: Dorise Bullion III M.D   On: 11/01/2017 07:16   Dg Chest Portable 1 View  Result Date: 11/01/2017 CLINICAL DATA:  OG tube placement EXAM: PORTABLE CHEST 1 VIEW COMPARISON:  September 27, 2015 FINDINGS: The OG tube terminates in the region of the distal stomach. The heart, hila, mediastinum, lungs, and pleura are otherwise  normal. IMPRESSION: OG tube terminates in the region of the distal stomach. Electronically Signed   By: Dorise Bullion III M.D   On: 11/01/2017 09:43   Labs: Basic Metabolic Panel: Recent Labs  Lab 11/01/17 0526 11/01/17 1156 11/02/17 0602  NA 142  --  143  K 3.1*  --  3.7  CL 109  --  114*  CO2 24  --  24  GLUCOSE 165*  --  110*  BUN 18  --  12  CREATININE 0.97  --  0.83  CALCIUM 9.1  --  8.8*  MG 2.3  --   --   PHOS  --  2.7  --    Liver Function Tests: Recent Labs  Lab 11/01/17 0526  AST 25  ALT 16  ALKPHOS 81  BILITOT 0.6  PROT 6.8  ALBUMIN 3.8   Recent Labs  Lab 11/01/17 0526  LIPASE 27   CBC: Recent Labs  Lab 11/01/17 0526 11/02/17 0602  WBC 11.3* 8.0  NEUTROABS 9.8*  --   HGB 13.6 13.0  HCT 42.0 41.3  MCV 93.1 95.2  PLT 232 212    CBG: Recent Labs  Lab 11/01/17 1136 11/01/17 1726 11/01/17 2345 11/02/17 0604 11/02/17 1129  GLUCAP 137* 87 122* 108* 141*    Signed:  Barton Dubois MD.  [facility] 11/02/2017, 1:54 PM

## 2017-11-03 ENCOUNTER — Ambulatory Visit (INDEPENDENT_AMBULATORY_CARE_PROVIDER_SITE_OTHER): Payer: PPO

## 2017-11-03 ENCOUNTER — Telehealth: Payer: Self-pay

## 2017-11-03 VITALS — BP 120/70 | HR 80 | Resp 16 | Ht 62.0 in | Wt 133.0 lb

## 2017-11-03 DIAGNOSIS — Z0000 Encounter for general adult medical examination without abnormal findings: Secondary | ICD-10-CM | POA: Diagnosis not present

## 2017-11-03 LAB — HEMOGLOBIN A1C
Hgb A1c MFr Bld: 6.5 % — ABNORMAL HIGH (ref 4.8–5.6)
Mean Plasma Glucose: 140 mg/dL

## 2017-11-03 NOTE — Telephone Encounter (Signed)
Transition Care Management Follow-up Telephone Call   Date discharged?   11/02/2017             How have you been since you were released from the hospital? better. Still hasn't picked up the rx's which I advised her to do so   Do you understand why you were in the hospital? Bowel blockage    Do you understand the discharge instructions? Yes. Take all her antibiotics until complete and return to ER if symptoms come back    Where were you discharged to? Home    Items Reviewed:  Medications reviewed: yes  Allergies reviewed: yes  Dietary changes reviewed: yes  Referrals reviewed: yes, see GI in 4 weeks   Functional Questionnaire:   Activities of Daily Living (ADLs):  can do herself but husband is there if she needs help     Any transportation issues/concerns?: no    Any patient concerns? no   Confirmed importance and date/time of follow-up visits scheduled yes, advised will schedule follow up within 14 days of discharge      Confirmed with patient if condition begins to worsen call PCP or go to the ER.  Patient was given the office number and encouraged to call back with question or concerns.  : yes

## 2017-11-03 NOTE — Progress Notes (Signed)
Subjective:   Carla [name] is a 72 y.o. female who presents for Medicare Annual (Subsequent) preventive examination.  Review of Systems:   Cardiac Risk Factors include: advanced age (>81men, >61 women);diabetes mellitus;dyslipidemia;hypertension;sedentary lifestyle     Objective:     Vitals: BP 120/70   Pulse 80   Resp 16   Ht 5\' 2"  (1.575 m)   Wt 133 lb (60.3 kg)   SpO2 98%   BMI 24.33 kg/m   Body mass index is 24.33 kg/m.  Advanced Directives 11/03/2017 11/01/2017 11/01/2017 12/11/2016 09/27/2015 05/30/2015 05/30/2015  Does Patient Have a Medical Advance Directive? No No No No No No No  Would patient like information on creating a medical advance directive? Yes (ED - Information included in AVS) No - Patient declined No - Patient declined Yes (MAU/Ambulatory/Procedural Areas - Information given) No - patient declined information No - patient declined information No - patient declined information  Pre-existing out of facility DNR order (yellow form or pink MOST form) - - - - - - -    Tobacco Social History   Tobacco Use  Smoking Status Former Smoker  . Packs/day: 3.00  . Years: 50.00  . Pack years: 150.00  . Types: Cigarettes  . Last attempt to quit: 12/12/2010  . Years since quitting: 6.8  Smokeless Tobacco Never Used     Counseling given: Not Answered   Clinical Intake:     Pain : 0-10 Pain Score: 2  Pain Location: Abdomen     Diabetes: Yes CBG done?: No Did pt. bring in CBG monitor from home?: No  How often do you need to have someone help you when you read instructions, pamphlets, or other written materials from your doctor or pharmacy?: 4 - Often What is the last grade level you completed in school?: 8th grade  Interpreter Needed?: No  Information entered by :: Wrenn Willcox LPN   Past Medical History:  Diagnosis Date  . ASCVD (arteriosclerotic cardiovascular disease)    No critical disease on cath in 8/97: mild AI with trival, if any l AS; negative  stress nuclear in 2010  . Back pain   . Cervical disc herniation   . Chronic bronchitis   . Colon polyps   . Depression   . Elevated hemoglobin A1c   . Fasting hyperglycemia   . GERD (gastroesophageal reflux disease)   . HSV (herpes simplex virus) infection   . Hydronephrosis   . Hypertension   . Osteoporosis   . Peripheral vascular disease ([place])    stauts post aortabifemoral graft    . Secondary erythrocytosis 08/05/2012   Secondary to COPD  . Tobacco abuse    has stopped   Past Surgical History:  Procedure Laterality Date  . ABDOMINAL HYSTERECTOMY    . aorta bifem graft    . CHOLECYSTECTOMY    . COLONOSCOPY  06/23/2006   Dr. Fields:Normal colon without evidence of polyps, masses, inflammatory changes/Normal retroflex view of the rectum  . COLONOSCOPY N/A 08/24/2013   Procedure: COLONOSCOPY;  Surgeon: Danie Binder, MD;  Location: [facility];  Service: Endoscopy;  Laterality: N/A;  10:30-moved to [facility] notified pt  . lysis of adhesions  2000  . PARTIAL HYSTERECTOMY    . right kidney surgery following damage during arterial surgery    . TOTAL ABDOMINAL HYSTERECTOMY W/ BILATERAL SALPINGOOPHORECTOMY  1975   Family History  Problem Relation Age of Onset  . Heart attack Father   . COPD Sister   .  Heart disease Sister   . Diabetes Sister   . Coronary artery disease Sister   . Liver cancer Sister 70  . Diabetes Sister   . Diabetes Sister   . COPD Sister   . Colon cancer Neg Hx    Social History   Socioeconomic History  . Marital status: Married    Spouse name: Not on file  . Number of children: 3  . Years of education: Not on file  . Highest education level: Not on file  Occupational History  . Occupation: disabled   Social Needs  . Financial resource strain: Not hard at all  . Food insecurity:    Worry: Never true    Inability: Never true  . Transportation needs:    Medical: No    Non-medical: No  Tobacco Use  . Smoking status: Former Smoker     Packs/day: 3.00    Years: 50.00    Pack years: 150.00    Types: Cigarettes    Last attempt to quit: 12/12/2010    Years since quitting: 6.8  . Smokeless tobacco: Never Used  Substance and Sexual Activity  . Alcohol use: No  . Drug use: No  . Sexual activity: Yes    Birth control/protection: Surgical  Lifestyle  . Physical activity:    Days per week: 0 days    Minutes per session: 0 min  . Stress: Only a little  Relationships  . Social connections:    Talks on phone: Twice a week    Gets together: Twice a week    Attends religious service: 1 to 4 times per year    Active member of club or organization: No    Attends meetings of clubs or organizations: Never    Relationship status: Married  Other Topics Concern  . Not on file  Social History Narrative   2 Children living 1 deceased     Outpatient Encounter Medications as of 11/03/2017  Medication Sig  . alendronate (FOSAMAX) 70 MG tablet TAKE 1 TABLET EACH WEEK 30 MIN BEFORE BREAKFAST WITH 8 OUNCES OF WATER FOR OSTEOPOROSIS. (Patient taking differently: Take 70 mg by mouth once a week. )  . ALPRAZolam (XANAX) 0.5 MG tablet Take 1 tablet (0.5 mg total) by mouth 2 (two) times daily.  Marland Kitchen amLODipine (NORVASC) 10 MG tablet TAKE ONE TABLET BY MOUTH ONCE DAILY.  Marland Kitchen aspirin EC 81 MG tablet Take 81 mg by mouth daily.  . ciprofloxacin (CIPRO) 500 MG tablet Take 1 tablet (500 mg total) by mouth 2 (two) times daily.  Marland Kitchen gabapentin (NEURONTIN) 100 MG capsule TAKE (2) CAPSULES BY MOUTH AT BEDTIME. (Patient taking differently: Take 200 mg by mouth at bedtime. )  . HYDROcodone-acetaminophen (NORCO) 10-325 MG tablet Take 1-2 tablets by mouth every 12 (twelve) hours as needed for severe pain. One tablet two times daily for pain  . metroNIDAZOLE (FLAGYL) 500 MG tablet Take 1 tablet (500 mg total) by mouth every 8 (eight) hours for 30 doses.  . montelukast (SINGULAIR) 10 MG tablet TAKE (1) TABLET BY MOUTH AT BEDTIME. (Patient taking differently: Take  10 mg by mouth at bedtime. )  . Multiple Vitamin (MULTIVITAMIN WITH MINERALS) TABS Take 1 tablet by mouth every evening.   . rivastigmine (EXELON) 4.6 mg/24hr APPLY 1 PATCH TO THE SKIN DAILY. DISCARD USED PATCH. (Patient taking differently: Place 4.6 mg onto the skin at bedtime. )  . rosuvastatin (CRESTOR) 40 MG tablet Take 1 tablet (40 mg total) by mouth daily.  Marland Kitchen  saccharomyces boulardii (FLORASTOR) 250 MG capsule Take 1 capsule (250 mg total) by mouth 2 (two) times daily.   No facility-administered encounter medications on file as of 11/03/2017.     Activities of Daily Living In your present state of health, do you have any difficulty performing the following activities: 11/03/2017 11/01/2017  Hearing? N N  Vision? N N  Comment - -  Difficulty concentrating or making decisions? N N  Walking or climbing stairs? N N  Dressing or bathing? N Y  Doing errands, shopping? N N  Preparing Food and eating ? N -  Using the Toilet? N -  In the past six months, have you accidently leaked urine? N -  Do you have problems with loss of bowel control? N -  Managing your Medications? N -  Managing your Finances? N -  Housekeeping or managing your Housekeeping? N -  Some recent data might be hidden    Patient Care Team: Fayrene Helper, MD as PCP - General Fields, Marga Melnick, MD as Consulting Physician (Gastroenterology)    Assessment:   This is a routine wellness examination for Anastasija.  Exercise Activities and Dietary recommendations Current Exercise Habits: The patient does not participate in regular exercise at present, Exercise limited by: None identified  Goals    . Exercise 3x per week (30 min per time)     Recommend starting a routine exercise program at least 3 days a week for 30-45 minutes at a time as tolerated.         Fall Risk Fall Risk  11/03/2017 09/10/2017 06/12/2017 12/11/2016 02/29/2016  Falls in the past year? No No No No No  Number falls in past yr: - - - - -  Injury  with Fall? - - - - -   Is the patient's home free of loose throw rugs in walkways, pet beds, electrical cords, etc?   yes      Grab bars in the bathroom? yes      Handrails on the stairs?   yes      Adequate lighting?   yes  Timed Get Up and Go performed:   Depression Screen PHQ 2/9 Scores 11/03/2017 09/10/2017 06/24/2017 06/12/2017  PHQ - 2 Score 0 3 6 3   PHQ- 9 Score 0 4 10 12      Cognitive Function MMSE - Mini Mental State Exam 01/23/2017 10/30/2015 07/06/2015  Orientation to time 2 4 5   Orientation to Place 5 5 5   Registration 3 3 3   Attention/ Calculation 5 5 5   Recall 0 0 0  Language- name 2 objects 2 2 2   Language- repeat 1 1 1   Language- follow 3 step command 3 3 3   Language- read & follow direction 1 1 1   Write a sentence 1 1 1   Copy design 0 1 0  Total score 23 26 26      6CIT Screen 11/03/2017 11/03/2017 12/11/2016  What Year? 4 points 4 points 0 points  What month? 3 points 3 points 0 points  What time? 0 points 0 points 0 points  Count back from 20 2 points - 0 points  Months in reverse 4 points 4 points 0 points  Repeat phrase 10 points - 0 points  Total Score 23 - 0    Immunization History  Administered Date(s) Administered  . Influenza Split 03/11/2011, 01/23/2012  . Influenza Whole 01/23/2007, 12/28/2008, 01/17/2010  . Influenza,inj,Quad PF,6+ Mos 01/05/2013, 02/10/2014, 12/15/2014, 12/11/2016  . Pneumococcal Conjugate-13 04/20/2014  .  Pneumococcal Polysaccharide-23 11/19/2010  . Td 04/05/2003  . Tdap 12/22/2013  . Zoster 11/19/2010    Qualifies for Shingles Vaccine?  Screening Tests Health Maintenance  Topic Date Due  . MAMMOGRAM  07/06/2017  . OPHTHALMOLOGY EXAM  09/09/2017  . URINE MICROALBUMIN  10/14/2017  . INFLUENZA VACCINE  10/23/2017  . HEMOGLOBIN A1C  03/12/2018  . FOOT EXAM  09/13/2018  . COLONOSCOPY  08/25/2023  . TETANUS/TDAP  12/23/2023  . DEXA SCAN  Completed  . Hepatitis C Screening  Completed  . PNA vac Low Risk Adult  Completed      Cancer Screenings: Lung: Low Dose CT Chest recommended if Age 32-80 years, 30 pack-year currently smoking OR have quit w/in 15years. Patient does qualify. Breast:  Up to date on Mammogram? No   Up to date of Bone Density/Dexa? No Colorectal: up to date   Additional Screenings: Hepatitis C Screening:      Plan:     I have personally reviewed and noted the following in the patient's chart:   . Medical and social history . Use of alcohol, tobacco or illicit drugs  . Current medications and supplements . Functional ability and status . Nutritional status . Physical activity . Advanced directives . List of other physicians . Hospitalizations, surgeries, and ER visits in previous 12 months . Vitals . Screenings to include cognitive, depression, and falls . Referrals and appointments  In addition, I have reviewed and discussed with patient certain preventive protocols, quality metrics, and best practice recommendations. A written personalized care plan for preventive services as well as general preventive health recommendations were provided to patient.     Kate Sable, LPN, LPN  [id number]

## 2017-11-03 NOTE — Patient Instructions (Signed)
Carla [name] , Thank you for taking time to come for your Medicare Wellness Visit. I appreciate your ongoing commitment to your health goals. Please review the following plan we discussed and let me know if I can assist you in the future.   Need to schedule TOC visit within 14 days  Need to get mammo appt and bone density at checkout    Screening recommendations/referrals: Colonoscopy: up to date  Mammogram: will schedule  Bone Density: will schedule  Recommended yearly ophthalmology/optometry visit for glaucoma screening and checkup Recommended yearly dental visit for hygiene and checkup  Vaccinations: Influenza vaccine: Due in the fall Pneumococcal vaccine: up to date  Tdap vaccine: up to date  Shingles vaccine: ask insurance if shingrix covered    Advanced directives: given   Conditions/risks identified: done   Next appointment: need to schedule    Preventive Care 72 Years and Older, Female Preventive care refers to lifestyle choices and visits with your health care provider that can promote health and wellness. What does preventive care include?  A yearly physical exam. This is also called an annual well check.  Dental exams once or twice a year.  Routine eye exams. Ask your health care provider how often you should have your eyes checked.  Personal lifestyle choices, including:  Daily care of your teeth and gums.  Regular physical activity.  Eating a healthy diet.  Avoiding tobacco and drug use.  Limiting alcohol use.  Practicing safe sex.  Taking low-dose aspirin every day.  Taking vitamin and mineral supplements as recommended by your health care provider. What happens during an annual well check? The services and screenings done by your health care provider during your annual well check will depend on your age, overall health, lifestyle risk factors, and family history of disease. Counseling  Your health care provider may ask you questions about  your:  Alcohol use.  Tobacco use.  Drug use.  Emotional well-being.  Home and relationship well-being.  Sexual activity.  Eating habits.  History of falls.  Memory and ability to understand (cognition).  Work and work Statistician.  Reproductive health. Screening  You may have the following tests or measurements:  Height, weight, and BMI.  Blood pressure.  Lipid and cholesterol levels. These may be checked every 5 years, or more frequently if you are over 10 years old.  Skin check.  Lung cancer screening. You may have this screening every year starting at age 72 if you have a 30-pack-year history of smoking and currently smoke or have quit within the past 15 years.  Fecal occult blood test (FOBT) of the stool. You may have this test every year starting at age 72.  Flexible sigmoidoscopy or colonoscopy. You may have a sigmoidoscopy every 5 years or a colonoscopy every 10 years starting at age 72.  Hepatitis C blood test.  Hepatitis B blood test.  Sexually transmitted disease (STD) testing.  Diabetes screening. This is done by checking your blood sugar (glucose) after you have not eaten for a while (fasting). You may have this done every 1-3 years.  Bone density scan. This is done to screen for osteoporosis. You may have this done starting at age 72.  Mammogram. This may be done every 1-2 years. Talk to your health care provider about how often you should have regular mammograms. Talk with your health care provider about your test results, treatment options, and if necessary, the need for more tests. Vaccines  Your health care provider may recommend  certain vaccines, such as:  Influenza vaccine. This is recommended every year.  Tetanus, diphtheria, and acellular pertussis (Tdap, Td) vaccine. You may need a Td booster every 10 years.  Zoster vaccine. You may need this after age 65.  Pneumococcal 13-valent conjugate (PCV13) vaccine. One dose is recommended  after age 26.  Pneumococcal polysaccharide (PPSV23) vaccine. One dose is recommended after age 61. Talk to your health care provider about which screenings and vaccines you need and how often you need them. This information is not intended to replace advice given to you by your health care provider. Make sure you discuss any questions you have with your health care provider. Document Released: 04/07/2015 Document Revised: 11/29/2015 Document Reviewed: 01/10/2015 Elsevier Interactive Patient Education  2017 [place] Prevention in the Home Falls can cause injuries. They can happen to people of all ages. There are many things you can do to make your home safe and to help prevent falls. What can I do on the outside of my home?  Regularly fix the edges of walkways and driveways and fix any cracks.  Remove anything that might make you trip as you walk through a door, such as a raised step or threshold.  Trim any bushes or trees on the path to your home.  Use bright outdoor lighting.  Clear any walking paths of anything that might make someone trip, such as rocks or tools.  Regularly check to see if handrails are loose or broken. Make sure that both sides of any steps have handrails.  Any raised decks and porches should have guardrails on the edges.  Have any leaves, snow, or ice cleared regularly.  Use sand or salt on walking paths during winter.  Clean up any spills in your garage right away. This includes oil or grease spills. What can I do in the bathroom?  Use night lights.  Install grab bars by the toilet and in the tub and shower. Do not use towel bars as grab bars.  Use non-skid mats or decals in the tub or shower.  If you need to sit down in the shower, use a plastic, non-slip stool.  Keep the floor dry. Clean up any water that spills on the floor as soon as it happens.  Remove soap buildup in the tub or shower regularly.  Attach bath mats securely with  double-sided non-slip rug tape.  Do not have throw rugs and other things on the floor that can make you trip. What can I do in the bedroom?  Use night lights.  Make sure that you have a light by your bed that is easy to reach.  Do not use any sheets or blankets that are too big for your bed. They should not hang down onto the floor.  Have a firm chair that has side arms. You can use this for support while you get dressed.  Do not have throw rugs and other things on the floor that can make you trip. What can I do in the kitchen?  Clean up any spills right away.  Avoid walking on wet floors.  Keep items that you use a lot in easy-to-reach places.  If you need to reach something above you, use a strong step stool that has a grab bar.  Keep electrical cords out of the way.  Do not use floor polish or wax that makes floors slippery. If you must use wax, use non-skid floor wax.  Do not have throw rugs and other  things on the floor that can make you trip. What can I do with my stairs?  Do not leave any items on the stairs.  Make sure that there are handrails on both sides of the stairs and use them. Fix handrails that are broken or loose. Make sure that handrails are as long as the stairways.  Check any carpeting to make sure that it is firmly attached to the stairs. Fix any carpet that is loose or worn.  Avoid having throw rugs at the top or bottom of the stairs. If you do have throw rugs, attach them to the floor with carpet tape.  Make sure that you have a light switch at the top of the stairs and the bottom of the stairs. If you do not have them, ask someone to add them for you. What else can I do to help prevent falls?  Wear shoes that:  Do not have high heels.  Have rubber bottoms.  Are comfortable and fit you well.  Are closed at the toe. Do not wear sandals.  If you use a stepladder:  Make sure that it is fully opened. Do not climb a closed stepladder.  Make  sure that both sides of the stepladder are locked into place.  Ask someone to hold it for you, if possible.  Clearly mark and make sure that you can see:  Any grab bars or handrails.  First and last steps.  Where the edge of each step is.  Use tools that help you move around (mobility aids) if they are needed. These include:  Canes.  Walkers.  Scooters.  Crutches.  Turn on the lights when you go into a dark area. Replace any light bulbs as soon as they burn out.  Set up your furniture so you have a clear path. Avoid moving your furniture around.  If any of your floors are uneven, fix them.  If there are any pets around you, be aware of where they are.  Review your medicines with your doctor. Some medicines can make you feel dizzy. This can increase your chance of falling. Ask your doctor what other things that you can do to help prevent falls. This information is not intended to replace advice given to you by your health care provider. Make sure you discuss any questions you have with your health care provider. Document Released: 01/05/2009 Document Revised: 08/17/2015 Document Reviewed: 04/15/2014 Elsevier Interactive Patient Education  2017 [employer].

## 2017-11-04 ENCOUNTER — Telehealth: Payer: Self-pay

## 2017-11-04 NOTE — Telephone Encounter (Signed)
Has appt for TOC 8/21. Was in yesterday for her wellness. States she hasn't had BM since before she was in the hospital and they didn't give her anything at discharge and wants you to send her something in. Per hospital note- she had a large BM on 8/10 but pt states this is not true. She is waiting in the lobby for recommendation

## 2017-11-04 NOTE — Telephone Encounter (Signed)
Milk of molasses enem check at [facility], you may send in please Needs to commit to oTC stool softeners colace 100 mg one 2 to 3 times daily, and milk of magnesia or oTC laxative like Mg citrate every 3 days if no bM

## 2017-11-05 ENCOUNTER — Ambulatory Visit: Payer: PPO | Admitting: Family Medicine

## 2017-11-05 ENCOUNTER — Other Ambulatory Visit (HOSPITAL_COMMUNITY)
Admission: RE | Admit: 2017-11-05 | Discharge: 2017-11-05 | Disposition: A | Discharge status: Home / Self Care | Payer: PPO | Source: Ambulatory Visit | Attending: Family Medicine | Admitting: Family Medicine

## 2017-11-05 ENCOUNTER — Ambulatory Visit (INDEPENDENT_AMBULATORY_CARE_PROVIDER_SITE_OTHER): Payer: PPO | Admitting: Family Medicine

## 2017-11-05 ENCOUNTER — Ambulatory Visit (HOSPITAL_COMMUNITY)
Admission: RE | Admit: 2017-11-05 | Discharge: 2017-11-05 | Disposition: A | Discharge status: Home / Self Care | Payer: PPO | Source: Ambulatory Visit | Attending: Family Medicine | Admitting: Family Medicine

## 2017-11-05 ENCOUNTER — Encounter: Payer: Self-pay | Admitting: Family Medicine

## 2017-11-05 VITALS — BP 130/70 | HR 92 | Temp 98.0°F | Resp 16 | Ht 62.0 in | Wt 135.4 lb

## 2017-11-05 DIAGNOSIS — K6389 Other specified diseases of intestine: Secondary | ICD-10-CM | POA: Insufficient documentation

## 2017-11-05 DIAGNOSIS — K56609 Unspecified intestinal obstruction, unspecified as to partial versus complete obstruction: Secondary | ICD-10-CM

## 2017-11-05 DIAGNOSIS — R1084 Generalized abdominal pain: Secondary | ICD-10-CM | POA: Diagnosis not present

## 2017-11-05 DIAGNOSIS — Z1211 Encounter for screening for malignant neoplasm of colon: Secondary | ICD-10-CM | POA: Diagnosis not present

## 2017-11-05 DIAGNOSIS — R935 Abnormal findings on diagnostic imaging of other abdominal regions, including retroperitoneum: Secondary | ICD-10-CM | POA: Diagnosis not present

## 2017-11-05 DIAGNOSIS — Z09 Encounter for follow-up examination after completed treatment for conditions other than malignant neoplasm: Secondary | ICD-10-CM | POA: Diagnosis not present

## 2017-11-05 DIAGNOSIS — R140 Abdominal distension (gaseous): Secondary | ICD-10-CM | POA: Diagnosis not present

## 2017-11-05 LAB — BASIC METABOLIC PANEL
Anion gap: 7 (ref 5–15)
BUN: 13 mg/dL (ref 8–23)
CHLORIDE: 106 mmol/L (ref 98–111)
CO2: 27 mmol/L (ref 22–32)
CREATININE: 1.08 mg/dL — AB (ref 0.44–1.00)
Calcium: 9.8 mg/dL (ref 8.9–10.3)
GFR calc Af Amer: 58 mL/min — ABNORMAL LOW (ref >60)
GFR calc non Af Amer: 50 mL/min — ABNORMAL LOW (ref >60)
GLUCOSE: 106 mg/dL — AB (ref 70–99)
POTASSIUM: 3.9 mmol/L (ref 3.5–5.1)
Sodium: 140 mmol/L (ref 135–145)

## 2017-11-05 NOTE — Telephone Encounter (Signed)
Has appt today

## 2017-11-05 NOTE — Patient Instructions (Signed)
F/u as before , cal;l if you need me sooner  Pleas get X ray at the hospital after you leave, I will; call this afternoon with report. If it says obstruction , you will need to return to hospital and I will call the ED Doctor  ( [phone])  After X ray , pleaswe get blood work at the hospital, chem 7 and EGFR  I am sending a Milkof molasses enema  To [facility]  Need to take OTC stool softener one 3 times every day  I am going top refer you to a GI Doc when settled for evalluation

## 2017-11-06 LAB — POC HEMOCCULT BLD/STL (OFFICE/1-CARD/DIAGNOSTIC): FECAL OCCULT BLD: NEGATIVE

## 2017-11-07 ENCOUNTER — Telehealth: Payer: Self-pay | Admitting: Family Medicine

## 2017-11-07 ENCOUNTER — Other Ambulatory Visit: Payer: Self-pay | Admitting: Family Medicine

## 2017-11-07 NOTE — Telephone Encounter (Signed)
Called patient to let her know previous message details, she found her medications

## 2017-11-07 NOTE — Telephone Encounter (Signed)
I see that pt reports she still feels terrible and agree that she needs to return to the hospital

## 2017-11-07 NOTE — Telephone Encounter (Signed)
Fill Date ID Written Drug Qty Days Prescriber Rx # Pharmacy Refill Daily Dose * Pymt Type PMP  10/27/2017  1  09/12/2017  Hydrocodone-Acetamin 10-325 Mg  60.00 30 Ma Sim  [id number]  Car ([id number])  1/1 20.00 MME Comm Ins  [place]  10/24/2017  1  06/12/2017  Alprazolam 0.5 Mg Tablet  60.00 30 Ma Sim  [id number]  Car ([id number])  5/4 2.00 LME Comm Ins  [place]  09/26/2017  1  09/12/2017  Hydrocodone-Acetamin 10-325 Mg  60.00 30 Ma Sim  [id number]  Car ([id number])  1/1 20.00 MME Comm Ins  [place]  09/26/2017  1  06/12/2017  Alprazolam 0.5 Mg Tablet  60.00 30 Ma Sim  [id number]  Car ([id number])  4/4 2.00 LME Comm Ins  [place]  08/27/2017  1  06/12/2017  Hydrocodone-Acetamin 10-325 Mg  60.00 30 Ma Sim  [id number]  Car ([id number])  1/1 20.00 MME Comm Ins  [place]  08/27/2017  1  06/12/2017  Alprazolam 0.5 Mg Tablet  60.00 30 Ma Sim  [id number]  Car ([id number])  3/4 2.00 LME Comm Ins  [place]  07/26/2017  1  06/12/2017  Hydrocodone-Acetamin 10-325 Mg  60.00 30 Ma Sim  [id number]  Car ([id number])  1/1 20.00 MME Comm Ins  [place]  07/26/2017  1  06/12/2017  Alprazolam 0.5 Mg Tablet  60.00 30 Ma Sim  [id number]  Car ([id number])  2/4 2.00 LME Comm Ins  [place]  06/29/2017  1  06/12/2017  Alprazolam 0.5 Mg Tablet  60.00 30 Ma Sim  [id number]  Car ([id number])  1/4 2.00 LME Comm Ins    06/28/2017  1  04/14/2017  Hydrocodone-Acetamin 10-325 Mg  60.00 30 Ma Sim

## 2017-11-07 NOTE — Telephone Encounter (Signed)
Pt aware and still feeling terrible and was advised that if she still feeling terrible as she states then she needs to go back to the ER. Pt agrees

## 2017-11-07 NOTE — Telephone Encounter (Signed)
She collected an rx on 8/5 from [facility] for 60 tabs. Please let her know

## 2017-11-07 NOTE — Telephone Encounter (Signed)
Patient left voicemail requesting pain medication for back pain.

## 2017-11-09 ENCOUNTER — Encounter: Payer: Self-pay | Admitting: Family Medicine

## 2017-11-09 DIAGNOSIS — Z09 Encounter for follow-up examination after completed treatment for conditions other than malignant neoplasm: Secondary | ICD-10-CM | POA: Insufficient documentation

## 2017-11-09 NOTE — Progress Notes (Signed)
   AMA MCMASTER     MRN: [id number]      DOB: 12/28/1945   HPI [name] is here for follow up and re-evaluation following hospitalization from 8/10 po 11/02/2017, when she was  Admitted with a partial SBO  Patient states she still feels ill and weak, states no flatus since discharge and no good bowel movement. States she is experiencing ongoing abdominal pain and distension, and feels lousy. Discharge note states pt had a large BM prior to d/c but pt states otherwise. Of note, the MD reports that pt left sooner than anticipated as a BM was reported, and she asked to be d/c home Repeat labs and GI eval to further as ess colon is recommended and referral entered. Currently on a 10 day course of antibiotics her spouse verifies For the first time ,her spouse accompanies her in as she is somewhat drowsy , anxious and has poor recall of her illness and the management ROS Denies recent fever or chills. Denies sinus pressure, nasal congestion, ear pain or sore throat. Denies chest congestion, productive cough or wheezing. Denies chest pains, palpitations and leg swelling  Denies dysuria, frequency, hesitancy or incontinence. Chronic joint pain,  and limitation in mobility.opioid dependent Denies headaches, seizures, numbness, or tingling. Denies depression,uncontrolled  anxiety or insomnia. Denies skin break down or rash.   PE  BP 130/70   Pulse 92   Temp 98 F (36.7 C) (Oral)   Resp 16   Ht 5\' 2"  (1.575 m)   Wt 135 lb 6.4 oz (61.4 kg)   SpO2 95%   BMI 24.76 kg/m   Patient drowsy and in no cardiopulmonary distress.  HEENT: No facial asymmetry, EOMI,   oropharynx pink and moist.  Neck supple no JVD, no mass.  Chest: Clear to auscultation bilaterally.dewcreased air entry  CVS: S1, S2 no murmurs, no S3.Regular rate.  ABD: distended, diffusely tender , no guarding or rebound, hypo and hypoactive Bowel sounds, maximal tenderness in lower quadrants, right more than left Rectal  exam: no stool visible on glove, rectum empty Ext: No edema  MS: Adequate ROM spine, shoulders, hips and knees.  Skin: Intact, no ulcerations or rash noted.  Psych: Good eye contact, drowsy. Memory impaired mildly  anxious not  depressed appearing.  CNS: CN 2-12 intact, power,  normal throughout.no focal deficits noted.   [facility] discharge follow-up [place] UP DISCUSSED WITH PT AND HER SPOUSE. MS Rhodus DOES NOT FEEL WELL AND UNFORTUNATELY STILL C/O ABDOMINAL DISTENSION AND SYMPTOMS OF OBSTRUCTION AS IN LACK OF FLATUS OR BM. I confirmed that she is indeed taking the antibiotics she was discharged on as the scan suggested inflammation/ infection of a segment of her colon She is also being referred to gI for f/u as colonoscopy may be indicated. Rectal exam today is negative for stool and her abdomen is distended with reduced BS I recommend liquid diet and MOM enema and twice daily stool softeners, she wil need to return to the hospital if she continues to worsen, abdominal xray also ordered and is inconclusive for  obstruction  SBO (small bowel obstruction) ([place]) Exam and history at the hospital follow up visit are highly suggestive of obstruction, repeat X ray is not definitive, she is advised to monitor symptoms and if she worsens or does no improve , she will need re admission for obstruction

## 2017-11-09 NOTE — Assessment & Plan Note (Signed)
HOSPITAL COURSE AND NECESSARY FOLLOW UP DISCUSSED WITH PT AND HER SPOUSE. Carla [name] DOES NOT FEEL WELL AND UNFORTUNATELY STILL C/O ABDOMINAL DISTENSION AND SYMPTOMS OF OBSTRUCTION AS IN LACK OF FLATUS OR BM. I confirmed that she is indeed taking the antibiotics she was discharged on as the scan suggested inflammation/ infection of a segment of her colon She is also being referred to gI for f/u as colonoscopy may be indicated. Rectal exam today is negative for stool and her abdomen is distended with reduced BS I recommend liquid diet and MOM enema and twice daily stool softeners, she wil need to return to the hospital if she continues to worsen, abdominal xray also ordered and is inconclusive for  obstruction

## 2017-11-09 NOTE — Assessment & Plan Note (Signed)
Exam and history at the hospital follow up visit are highly suggestive of obstruction, repeat X ray is not definitive, she is advised to monitor symptoms and if she worsens or does no improve , she will need re admission for obstruction

## 2017-11-10 ENCOUNTER — Encounter: Payer: Self-pay | Admitting: Gastroenterology

## 2017-11-10 ENCOUNTER — Telehealth: Payer: Self-pay | Admitting: Family Medicine

## 2017-11-10 NOTE — Telephone Encounter (Signed)
After advising the pt Friday to go to the ER- she did not and her husband has stopped her antibiotics and is asking for a "diet" for her blockage instead and pt still hasn't had a bowel movement

## 2017-11-10 NOTE — Telephone Encounter (Signed)
I tried speaking with her and message machine came on, I did not leave a message  Diet rich in fiber, fruit , vegetables and water is healthy HOWEVER review of her records and the fact that she still has no BM, She NEEDS to go to the emergency room

## 2017-11-10 NOTE — Telephone Encounter (Signed)
Patients husband left voicemail requesting advise for a diet due to patients bacterial blockage. She is not using the restroom at all. He states he stopped her antibiotics because it was making her confused and irritable.  [phone]/ [phone]

## 2017-11-12 ENCOUNTER — Ambulatory Visit: Payer: PPO | Admitting: Family Medicine

## 2017-11-12 ENCOUNTER — Ambulatory Visit (HOSPITAL_COMMUNITY)
Admission: RE | Admit: 2017-11-12 | Discharge: 2017-11-12 | Disposition: A | Discharge status: Home / Self Care | Payer: PPO | Source: Ambulatory Visit | Attending: Family Medicine | Admitting: Family Medicine

## 2017-11-12 DIAGNOSIS — Z13820 Encounter for screening for osteoporosis: Secondary | ICD-10-CM | POA: Insufficient documentation

## 2017-11-12 DIAGNOSIS — Z1231 Encounter for screening mammogram for malignant neoplasm of breast: Secondary | ICD-10-CM | POA: Diagnosis not present

## 2017-11-12 DIAGNOSIS — M810 Age-related osteoporosis without current pathological fracture: Secondary | ICD-10-CM | POA: Insufficient documentation

## 2017-11-12 DIAGNOSIS — Z1239 Encounter for other screening for malignant neoplasm of breast: Secondary | ICD-10-CM

## 2017-11-12 DIAGNOSIS — Z780 Asymptomatic menopausal state: Secondary | ICD-10-CM | POA: Insufficient documentation

## 2017-11-13 ENCOUNTER — Other Ambulatory Visit: Payer: Self-pay | Admitting: Pharmacist

## 2017-11-13 ENCOUNTER — Ambulatory Visit: Payer: Self-pay | Admitting: Pharmacist

## 2017-11-13 NOTE — Patient Outreach (Addendum)
[facility]) Care Management  11/13/2017  Carla [name] 1945-06-30 [id number]   72 year old female requiring medication reconciliation post discharge (30 day post discharge).  PMH significant for, but not limited to: HTN, PVD, HLD, GERD, DMT2 (recent A1c 6.5), primary hyperparathyroidism, osteoporosis, vitD deficiency and recent SBO resulting in inpatient admission on .  SUBJECTIVE: Successful call placed to Carla [name] today to perform medication reconciliation post discharge (30-day post discharge) with HIPAA identifiers verified x3.  Carla [name] stated she did not feel well and "nothing had changed since her hospitalization".  She goes on to state she did have an antibiotic course (10-day) that was prescribed on discharge, but is now completed in full. No new medications have been prescribed since this recent admission.  Patient denies any changes to medication therapy as well.  Patient uses [facility] in [place] as her pharmacy.  OBJECTIVE: Medications Reviewed Today    Reviewed by Lavera Guise, [place] (Pharmacist) on 11/13/17 at 43  Med List Status: <None>  Medication Order Taking? Sig Documenting Provider Last Dose Status Informant  alendronate (FOSAMAX) 70 MG tablet [id number] Yes TAKE 1 TABLET EACH WEEK 30 MIN BEFORE BREAKFAST WITH 8 OUNCES OF WATER FOR OSTEOPOROSIS.  Patient taking differently:  Take 70 mg by mouth once a week.    Fayrene Helper, MD Taking Active Spouse/Significant Other  ALPRAZolam Duanne Moron) 0.5 MG tablet [id number] Yes Take 1 tablet (0.5 mg total) by mouth 2 (two) times daily. Fayrene Helper, MD Taking Active Spouse/Significant Other  amLODipine (NORVASC) 10 MG tablet [id number] Yes TAKE ONE TABLET BY MOUTH ONCE DAILY. Fayrene Helper, MD Taking Active Spouse/Significant Other  aspirin EC 81 MG tablet [id number] Yes Take 81 mg by mouth daily. [provider] Taking Active Spouse/Significant Other         Discontinued 11/13/17 1059 (Completed Course)   gabapentin (NEURONTIN) 100 MG capsule [id number] Yes TAKE (2) CAPSULES BY MOUTH AT BEDTIME.  Patient taking differently:  Take 200 mg by mouth at bedtime.    Fayrene Helper, MD Taking Active Spouse/Significant Other  HYDROcodone-acetaminophen [facility]) 10-325 MG tablet [id number] Yes Take 1-2 tablets by mouth every 12 (twelve) hours as needed for severe pain. One tablet two times daily for pain Barton Dubois, MD Taking Active   montelukast (SINGULAIR) 10 MG tablet [id number] Yes TAKE (1) TABLET BY MOUTH AT BEDTIME. Fayrene Helper, MD Taking Active   Multiple Vitamin (MULTIVITAMIN WITH MINERALS) TABS [id number] Yes Take 1 tablet by mouth every evening.  [provider] Taking Active Spouse/Significant Other  rivastigmine (EXELON) 4.6 mg/24hr [id number] Yes APPLY 1 PATCH TO THE SKIN DAILY. DISCARD USED PATCH.  Patient taking differently:  Place 4.6 mg onto the skin at bedtime.    Fayrene Helper, MD Taking Active Spouse/Significant Other  rosuvastatin (CRESTOR) 40 MG tablet [id number] Yes Take 1 tablet (40 mg total) by mouth daily. Fayrene Helper, MD Taking Active Spouse/Significant Other  saccharomyces boulardii (FLORASTOR) 250 MG capsule [id number] Yes Take 1 capsule (250 mg total) by mouth 2 (two) times daily. Barton Dubois, MD Taking Active         ASSESSMENT: Date Discharged from Hospital: 11/02/17 Date Medication Reconciliation Performed: 11/13/2017  Medications Discontinued at Discharge: n/a  New Medications at Discharge:   Ciprofloxacin (10-day course)  Patient was recently discharged from hospital and all medications have been reviewed   Drugs sorted by system:  Neurologic/Psychologic: alprazolam, gabapentin  Cardiovascular: amlodipine, aspirin, rosuvastatin  Pulmonary/Allergy: montelukast  Gastrointestinal:  Florastor Field seismologist)  Endocrine: alendronate  Topical: rivastigmine patch  Pain:  hydrocodone/apap PRN  Vitamins/Minerals: MVI  Infectious Diseases: ciprofloxaxin  Medications to avoid in the elderly:  Alprazolam: This drug is identified in the Beers Criteria as a potentially inappropriate medication to be avoided in patients 65 years and older (independent of diagnosis or condition) due to increased risk of impaired cognition, delirium, falls, fractures, and motor vehicle accidents with benzodiazepine use. Per Beers list, this medication should be avoided in elderly patients with dementia or cognitive impairment because of adverse CNS effects. Per [employer]  PLAN: -Instructed patient to continue taking medications as prescribed.  -I will route medication reconciliation note to PCP    Regina Eck, PharmD, [place]  [phone]

## 2017-11-24 ENCOUNTER — Other Ambulatory Visit: Payer: Self-pay | Admitting: Family Medicine

## 2017-11-25 ENCOUNTER — Other Ambulatory Visit: Payer: Self-pay | Admitting: Family Medicine

## 2017-11-28 ENCOUNTER — Other Ambulatory Visit: Payer: Self-pay | Admitting: Family Medicine

## 2017-12-03 ENCOUNTER — Telehealth: Payer: Self-pay | Admitting: Family Medicine

## 2017-12-03 ENCOUNTER — Ambulatory Visit: Payer: PPO | Admitting: Family Medicine

## 2017-12-03 ENCOUNTER — Encounter: Payer: Self-pay | Admitting: Family Medicine

## 2017-12-03 NOTE — Telephone Encounter (Signed)
Mailed no show letter

## 2017-12-22 ENCOUNTER — Other Ambulatory Visit: Payer: Self-pay | Admitting: Family Medicine

## 2017-12-24 ENCOUNTER — Other Ambulatory Visit: Payer: Self-pay | Admitting: Family Medicine

## 2017-12-27 ENCOUNTER — Other Ambulatory Visit: Payer: Self-pay | Admitting: Family Medicine

## 2017-12-30 ENCOUNTER — Ambulatory Visit (INDEPENDENT_AMBULATORY_CARE_PROVIDER_SITE_OTHER): Payer: PPO | Admitting: Family Medicine

## 2017-12-30 ENCOUNTER — Encounter: Payer: Self-pay | Admitting: Family Medicine

## 2017-12-30 VITALS — BP 120/74 | HR 86 | Resp 16 | Ht 62.0 in | Wt 127.0 lb

## 2017-12-30 DIAGNOSIS — E1151 Type 2 diabetes mellitus with diabetic peripheral angiopathy without gangrene: Secondary | ICD-10-CM | POA: Diagnosis not present

## 2017-12-30 DIAGNOSIS — E1165 Type 2 diabetes mellitus with hyperglycemia: Secondary | ICD-10-CM | POA: Diagnosis not present

## 2017-12-30 DIAGNOSIS — E785 Hyperlipidemia, unspecified: Secondary | ICD-10-CM | POA: Diagnosis not present

## 2017-12-30 DIAGNOSIS — G8929 Other chronic pain: Secondary | ICD-10-CM

## 2017-12-30 DIAGNOSIS — I10 Essential (primary) hypertension: Secondary | ICD-10-CM

## 2017-12-30 DIAGNOSIS — Z23 Encounter for immunization: Secondary | ICD-10-CM | POA: Diagnosis not present

## 2017-12-30 MED ORDER — ALPRAZOLAM ER 0.5 MG PO TB24: ORAL_TABLET | ORAL | 3 refills | Status: DC

## 2017-12-30 MED ORDER — HYDROCODONE-ACETAMINOPHEN 7.5-325 MG PO TABS: ORAL_TABLET | ORAL | 0 refills | Status: AC

## 2017-12-30 NOTE — Patient Instructions (Signed)
F/u in 11 weeks , call if you need me before  Flu vaccine today  REDUCED dose of hydrocodone as we discussed , but still two times daily  Plkease get fasting lipid, cmp and eGFr and hBSA1c 1 week before next visit  Thank you  for choosing [facility]. We consider it a privelige to serve you.  Delivering excellent health care in a caring and  compassionate way is our goal.  Partnering with you,  so that together we can achieve this goal is our strategy.   Marland Kitchen

## 2017-12-30 NOTE — Progress Notes (Signed)
Fill Date ID Written Drug Qty Days Prescriber Rx # Pharmacy Refill Daily Dose * Pymt Type PMP  12/01/2017  1  12/01/2017  Alprazolam 0.5 Mg Tablet  60.00 30 Ma Sim  [id number]  Car ([id number])  0/3 2.00 LME Comm Ins  [place]  11/25/2017  1  09/12/2017  Hydrocodone-Acetamin 10-325 Mg  60.00 30 Ma Sim  [id number]  Car ([id number])  0/0 20.00 MME Comm Ins  [place]  10/27/2017  1  09/12/2017  Hydrocodone-Acetamin 10-325 Mg  60.00 30 Ma Sim  [id number]  Car ([id number])  0/0 20.00 MME Comm Ins  [place]  10/24/2017  1  06/12/2017  Alprazolam 0.5 Mg Tablet  60.00 30 Ma Sim  [id number]  Car ([id number])  4/4 2.00 LME Comm Ins  [place]  09/26/2017  1  09/12/2017  Hydrocodone-Acetamin 10-325 Mg  60.00 30 Ma Sim  [id number]  Car ([id number])  0/0 20.00 MME Comm Ins  [place]  09/26/2017  1  06/12/2017  Alprazolam 0.5 Mg Tablet  60.00 30 Ma Sim  [id number]  Car ([id number])  3/4 2.00 LME Comm Ins  [place]  08/27/2017  1  06/12/2017  Hydrocodone-Acetamin 10-325 Mg  60.00 30 Ma Sim  [id number]  Car ([id number])  0/0 20.00 MME Comm Ins  [place]  08/27/2017  1  06/12/2017  Alprazolam 0.5 Mg Tablet  60.00 30 Ma Sim  [id number]  Car ([id number])  2/4 2.00 LME Comm Ins  [place]  07/26/2017  1  06/12/2017  Hydrocodone-Acetamin 10-325 Mg  60.00 30 Ma Sim  [id number]  Car ([id number])  0/0 20.00 MME Comm Ins  [place]  07/26/2017  1  06/12/2017  Alprazolam 0.5 Mg Tablet  60.00 30 Ma Sim  [id number]  Car ([id number])  1/4 2.00 LME Comm Ins  [place]  06/29/2017  1  06/12/2017  Alprazolam 0.5 Mg Tablet  60.00 30 Ma Sim  [id number]  Car ([id number])  0/4 2.00 LME Comm Ins  [place]  06/28/2017  1  04/14/2017  Hydrocodone-Acetamin 10-325 Mg  60.00 30 Ma Sim  [id number]  Car ([id number])  0/0 20.00 MME Comm Ins  [place]  06/02/2017  1  06/02/2017  Alprazolam 0.5 Mg Tablet  60.00 30 Ma Sim  [id number]  Car ([id number])  0/0 2.00 LME Comm Ins  [place]  05/29/2017  1  04/14/2017  Hydrocodone-Acetamin 10-325 Mg  60.00 30 Ma Sim  [id number]  Car ([id number])  0/0 20.00 MME Comm Ins    05/01/2017  1  04/14/2017  Hydrocodone-Acetamin 10-325 Mg  60.00 30

## 2018-01-02 ENCOUNTER — Encounter: Payer: Self-pay | Admitting: Family Medicine

## 2018-01-02 MED ORDER — HYDROCODONE-ACETAMINOPHEN 7.5-325 MG PO TABS: ORAL_TABLET | ORAL | 0 refills | Status: AC

## 2018-01-02 MED ORDER — HYDROCODONE-ACETAMINOPHEN 7.5-325 MG PO TABS: ORAL_TABLET | ORAL | 0 refills | Status: DC

## 2018-01-02 NOTE — Assessment & Plan Note (Signed)
Controlled, no change in medication DASH diet and commitment to daily physical activity for a minimum of 30 minutes discussed and encouraged, as a part of hypertension management. The importance of attaining a healthy weight is also discussed.  BP/Weight 12/30/2017 11/05/2017 11/03/2017 11/02/2017 11/01/2017 09/10/2017 8/33/7445  Systolic BP 146 047 998 721 - 587 276  Diastolic BP 74 70 70 58 - 76 50  Wt. (Lbs) 127 135.4 133 - 132.28 132 130.12  BMI 23.23 24.76 24.33 - 23.43 24.14 23.8

## 2018-01-02 NOTE — Assessment & Plan Note (Signed)
The patient's Controlled Substance registry is reviewed and compliance confirmed. Adequacy of  Pain control and level of function is assessed. Medication dosing is adjusted as deemed appropriate.Now decreasing dose of hydrocodone, plan is to quitg Twelve weeks of medication is prescribed , patient signs for the script and is provided with a follow up appointment between 11 to 12 weeks .

## 2018-01-02 NOTE — Progress Notes (Signed)
Carla [name]     MRN: [id number]      DOB: Apr 08, 1945   HPI Carla [name] is here for follow up and re-evaluation of chronic medical conditions, medication management and review of any available recent lab and radiology data.  Preventive health is updated, specifically  Cancer screening and Immunization.    The PT denies any adverse reactions to current medications since the last visit.  There are no new concerns.  There are no specific complaints   ROS Denies recent fever or chills. Denies sinus pressure, nasal congestion, ear pain or sore throat. Denies chest congestion, productive cough or wheezing. Denies chest pains, palpitations and leg swelling Denies abdominal pain, nausea, vomiting,diarrhea or constipation.   Denies dysuria, frequency, hesitancy or incontinence. Denies uncontrolled  joint pain, swelling and limitation in mobility. Denies headaches, seizures, numbness, or tingling. Denies depression, does have anxiety and mild  insomnia. Denies skin break down or rash.   PE  BP 120/74   Pulse 86   Resp 16   Ht 5\' 2"  (1.575 m)   Wt 127 lb (57.6 kg)   SpO2 93%   BMI 23.23 kg/m   Patient alert and oriented and in no cardiopulmonary distress.  HEENT: No facial asymmetry, EOMI,   oropharynx pink and moist.  Neck supple no JVD, no mass.  Chest: Clear to auscultation bilaterally.Decreased air entry  CVS: S1, S2 no murmurs, no S3.Regular rate.  ABD: Soft non tender.   Ext: No edema  MS: Adequate ROM spine, shoulders, hips and knees.  Skin: Intact, no ulcerations or rash noted.  Psych: Good eye contact, normal affect. Memory intact not anxious or depressed appearing.  CNS: CN 2-12 intact, power,  normal throughout.no focal deficits noted.   Assessment & Plan  Well controlled type 2 diabetes mellitus with peripheral circulatory disorder (HCC) Controlled, no change in medication Carla [name] is reminded of the importance of commitment to daily physical  activity for 30 minutes or more, as able and the need to limit carbohydrate intake to 30 to 60 grams per meal to help with blood sugar control.      Carla [name] is reminded of the importance of daily foot exam, annual eye examination, and good blood sugar, blood pressure and cholesterol control.  Diabetic Labs Latest Ref Rng & Units 11/05/2017 11/02/2017 11/01/2017 09/10/2017 02/17/2017  HbA1c 4.8 - 5.6 % - - 6.5(H) 6.3(H) 6.1(H)  Microalbumin Not Estab. ug/mL - - - - -  Micro/Creat Ratio 0.0 - 30.0 mg/g creat - - - - -  Chol <200 mg/dL - - - 202(H) 171  HDL >50 mg/dL - - - 45(L) 58  Calc LDL mg/dL (calc) - - - 116(H) 86  Triglycerides <150 mg/dL - - - 307(H) 172(H)  Creatinine 0.44 - 1.00 mg/dL 1.08(H) 0.83 0.97 0.87 0.68   BP/Weight 12/30/2017 11/05/2017 11/03/2017 11/02/2017 11/01/2017 09/10/2017 06/20/9240  Systolic BP 683 419 622 297 - 989 211  Diastolic BP 74 70 70 58 - 76 50  Wt. (Lbs) 127 135.4 133 - 132.28 132 130.12  BMI 23.23 24.76 24.33 - 23.43 24.14 23.8   Foot/eye exam completion dates Latest Ref Rng & Units 09/10/2017 07/23/2016  Eye Exam No Retinopathy - -  Foot Form Completion - Done Done        Encounter for chronic pain management The patient's Controlled Substance registry is reviewed and compliance confirmed. Adequacy of  Pain control and level of function is assessed. Medication dosing is adjusted as deemed  appropriate.Now decreasing dose of hydrocodone, plan is to quitg Twelve weeks of medication is prescribed , patient signs for the script and is provided with a follow up appointment between 11 to 12 weeks .   Essential hypertension Controlled, no change in medication DASH diet and commitment to daily physical activity for a minimum of 30 minutes discussed and encouraged, as a part of hypertension management. The importance of attaining a healthy weight is also discussed.  BP/Weight 12/30/2017 11/05/2017 11/03/2017 11/02/2017 11/01/2017 09/10/2017 3/97/6734  Systolic  BP 193 790 240 973 - 532 992  Diastolic BP 74 70 70 58 - 76 50  Wt. (Lbs) 127 135.4 133 - 132.28 132 130.12  BMI 23.23 24.76 24.33 - 23.43 24.14 23.8       Hyperlipidemia LDL goal <100 Hyperlipidemia:Low fat diet discussed and encouraged. Uncontrolled, needs to reduce fried and fatty food intake   Lipid Panel  Lab Results  Component Value Date   CHOL 202 (H) 09/10/2017   HDL 45 (L) 09/10/2017   LDLCALC 116 (H) 09/10/2017   LDLDIRECT 166 (H) 10/11/2009   TRIG 307 (H) 09/10/2017   CHOLHDL 4.5 09/10/2017

## 2018-01-02 NOTE — Assessment & Plan Note (Signed)
Hyperlipidemia:Low fat diet discussed and encouraged. Uncontrolled, needs to reduce fried and fatty food intake   Lipid Panel  Lab Results  Component Value Date   CHOL 202 (H) 09/10/2017   HDL 45 (L) 09/10/2017   LDLCALC 116 (H) 09/10/2017   LDLDIRECT 166 (H) 10/11/2009   TRIG 307 (H) 09/10/2017   CHOLHDL 4.5 09/10/2017

## 2018-01-02 NOTE — Assessment & Plan Note (Addendum)
Controlled, no change in medication Carla [name] is reminded of the importance of commitment to daily physical activity for 30 minutes or more, as able and the need to limit carbohydrate intake to 30 to 60 grams per meal to help with blood sugar control.      Carla [name] is reminded of the importance of daily foot exam, annual eye examination, and good blood sugar, blood pressure and cholesterol control.  Diabetic Labs Latest Ref Rng & Units 11/05/2017 11/02/2017 11/01/2017 09/10/2017 02/17/2017  HbA1c 4.8 - 5.6 % - - 6.5(H) 6.3(H) 6.1(H)  Microalbumin Not Estab. ug/mL - - - - -  Micro/Creat Ratio 0.0 - 30.0 mg/g creat - - - - -  Chol <200 mg/dL - - - 202(H) 171  HDL >50 mg/dL - - - 45(L) 58  Calc LDL mg/dL (calc) - - - 116(H) 86  Triglycerides <150 mg/dL - - - 307(H) 172(H)  Creatinine 0.44 - 1.00 mg/dL 1.08(H) 0.83 0.97 0.87 0.68   BP/Weight 12/30/2017 11/05/2017 11/03/2017 11/02/2017 11/01/2017 09/10/2017 9/79/8921  Systolic BP 194 174 081 448 - 185 631  Diastolic BP 74 70 70 58 - 76 50  Wt. (Lbs) 127 135.4 133 - 132.28 132 130.12  BMI 23.23 24.76 24.33 - 23.43 24.14 23.8   Foot/eye exam completion dates Latest Ref Rng & Units 09/10/2017 07/23/2016  Eye Exam No Retinopathy - -  Foot Form Completion - Done Done

## 2018-01-19 ENCOUNTER — Other Ambulatory Visit: Payer: Self-pay | Admitting: Family Medicine

## 2018-01-25 ENCOUNTER — Other Ambulatory Visit: Payer: Self-pay | Admitting: Family Medicine

## 2018-01-26 ENCOUNTER — Other Ambulatory Visit: Payer: Self-pay | Admitting: Family Medicine

## 2018-02-05 ENCOUNTER — Other Ambulatory Visit: Payer: Self-pay | Admitting: Family Medicine

## 2018-02-13 ENCOUNTER — Telehealth: Payer: Self-pay | Admitting: Gastroenterology

## 2018-02-13 ENCOUNTER — Ambulatory Visit: Payer: PPO | Admitting: Gastroenterology

## 2018-02-13 ENCOUNTER — Encounter: Payer: Self-pay | Admitting: Gastroenterology

## 2018-02-13 NOTE — Telephone Encounter (Signed)
PATIENT WAS A NO SHOW AND LETTER SENT  °

## 2018-02-13 NOTE — Telephone Encounter (Signed)
FYI Dr. Moshe Cipro.

## 2018-02-20 ENCOUNTER — Other Ambulatory Visit: Payer: Self-pay

## 2018-02-20 ENCOUNTER — Encounter (HOSPITAL_COMMUNITY): Payer: Self-pay | Admitting: Emergency Medicine

## 2018-02-20 ENCOUNTER — Emergency Department (HOSPITAL_COMMUNITY)
Admission: EM | Admit: 2018-02-20 | Discharge: 2018-02-20 | Disposition: A | Discharge status: Home / Self Care | Payer: PPO | Attending: Emergency Medicine | Admitting: Emergency Medicine

## 2018-02-20 ENCOUNTER — Emergency Department (HOSPITAL_COMMUNITY): Payer: PPO

## 2018-02-20 DIAGNOSIS — S199XXA Unspecified injury of neck, initial encounter: Secondary | ICD-10-CM | POA: Diagnosis present

## 2018-02-20 DIAGNOSIS — Y999 Unspecified external cause status: Secondary | ICD-10-CM | POA: Diagnosis not present

## 2018-02-20 DIAGNOSIS — I10 Essential (primary) hypertension: Secondary | ICD-10-CM | POA: Insufficient documentation

## 2018-02-20 DIAGNOSIS — E119 Type 2 diabetes mellitus without complications: Secondary | ICD-10-CM | POA: Insufficient documentation

## 2018-02-20 DIAGNOSIS — M542 Cervicalgia: Secondary | ICD-10-CM | POA: Diagnosis not present

## 2018-02-20 DIAGNOSIS — Y929 Unspecified place or not applicable: Secondary | ICD-10-CM | POA: Diagnosis not present

## 2018-02-20 DIAGNOSIS — Z79899 Other long term (current) drug therapy: Secondary | ICD-10-CM | POA: Diagnosis not present

## 2018-02-20 DIAGNOSIS — Y939 Activity, unspecified: Secondary | ICD-10-CM | POA: Diagnosis not present

## 2018-02-20 DIAGNOSIS — Z87891 Personal history of nicotine dependence: Secondary | ICD-10-CM | POA: Insufficient documentation

## 2018-02-20 DIAGNOSIS — X58XXXA Exposure to other specified factors, initial encounter: Secondary | ICD-10-CM | POA: Diagnosis not present

## 2018-02-20 DIAGNOSIS — Z7982 Long term (current) use of aspirin: Secondary | ICD-10-CM | POA: Insufficient documentation

## 2018-02-20 DIAGNOSIS — S161XXA Strain of muscle, fascia and tendon at neck level, initial encounter: Secondary | ICD-10-CM | POA: Insufficient documentation

## 2018-02-20 MED ORDER — LIDOCAINE 5 % EX PTCH
1.0000 | MEDICATED_PATCH | CUTANEOUS | Status: DC
  Administered 2018-02-20: 1 via TRANSDERMAL
  Filled 2018-02-20 (×4): qty 1

## 2018-02-20 MED ORDER — LIDOCAINE 5 % EX PTCH: 1.0000 | MEDICATED_PATCH | CUTANEOUS | 0 refills | Status: DC

## 2018-02-20 MED ORDER — LIDOCAINE 5 % EX PTCH
1.0000 | MEDICATED_PATCH | CUTANEOUS | Status: DC
  Filled 2018-02-20 (×4): qty 1

## 2018-02-20 NOTE — ED Triage Notes (Signed)
Pt reports neck pain for last several days. Pt denies any known injury. Airway patent.

## 2018-02-20 NOTE — Discharge Instructions (Addendum)
You have been diagnosed today with muscle strain of the neck.  At this time there does not appear to be the presence of an emergent medical condition, however there is always the potential for conditions to change. Please read and follow the below instructions.  Please return to the Emergency Department immediately for any new or worsening symptoms or if your symptoms do not improve within 5 days Please be sure to follow up with your Primary Care Provider on Monday regarding your visit today; please call their office to schedule an appointment even if you are feeling better for a follow-up visit. You may use the Lidoderm patch as prescribed to help with your neck pain.  You may also use heating pads and rest to help with your symptoms.  Return to the emergency department if: You have increasing pain or swelling in the injured area. You have numbness, tingling, or a significant loss of strength in the injured area. You have dizziness, headache or trouble seeing You have trouble breathing. You have trouble swallowing. You have muscle pain along with a stiff neck, fever, and vomiting. You have severe muscle weakness or cannot move part of your body.  Please read the additional information packets attached to your discharge summary.  Do not take your medicine if  develop an itchy rash, swelling in your mouth or lips, or difficulty breathing.

## 2018-02-20 NOTE — ED Provider Notes (Signed)
[facility] EMERGENCY DEPARTMENT Provider Note   CSN: [id number] Arrival date & time: 02/20/18  1112     History   Chief Complaint Chief Complaint  Patient presents with  . Neck Pain    HPI Carla [name] is a 72 y.o. female presents today for right-sided neck pain that began 2 days ago.  Patient denies injury, fall or trauma.  She states that she awoke with the neck pain 2 days ago, throbbing right sided intermittent and moderate in intensity.  Patient states that at rest she does not have any pain however with turning her head to the right or shrugging the right shoulder she develops pain.  Patient states that she has been otherwise feeling well, denies history of fever.  Patient denies numbness/tingling or weakness of the extremities.  Denies headache, saddle area paresthesias, bowel/bladder incontinence.  Patient denies dizziness, confusion or visual changes.  She is husband at bedside corroborates her story.  HPI  Past Medical History:  Diagnosis Date  . ASCVD (arteriosclerotic cardiovascular disease)    No critical disease on cath in 8/97: mild AI with trival, if any l AS; negative stress nuclear in 2010  . Back pain   . Cervical disc herniation   . Chronic bronchitis   . Colon polyps   . Depression   . Elevated hemoglobin A1c   . Fasting hyperglycemia   . GERD (gastroesophageal reflux disease)   . HSV (herpes simplex virus) infection   . Hydronephrosis   . Hypertension   . Osteoporosis   . Peripheral vascular disease ([place])    stauts post aortabifemoral graft    . Secondary erythrocytosis 08/05/2012   Secondary to COPD  . Tobacco abuse    has stopped    Patient Active Problem List   Diagnosis Date Noted  . Type 2 diabetes mellitus with hyperglycemia ([place]) 11/01/2017  . Hydronephrosis of right kidney 11/01/2017  . MCI (mild cognitive impairment) with memory loss 01/27/2017  . De Quervain's disease (radial styloid tenosynovitis) 10/14/2016  . Atrophic  vaginitis 04/18/2016  . Onychomycosis 07/08/2015  . Western blot positive HSV2 02/12/2015  . GAD (generalized anxiety disorder) 02/13/2014  . Well controlled type 2 diabetes mellitus with peripheral circulatory disorder ([place]) 03/31/2013  . Seasonal allergies 03/30/2013  . Cervical neck pain with evidence of disc disease 11/03/2012  . Secondary erythrocytosis 08/05/2012  . Encounter for chronic pain management 04/14/2012  . Hearing loss 04/13/2012  . Carotid bruit 04/13/2012  . Back pain with radiation 10/15/2011  . Emphysema with chronic bronchitis ([place]) 01/17/2011  . Arteriosclerotic cardiovascular disease (ASCVD) 09/17/2009  . PERIPHERAL VASCULAR DISEASE 09/17/2009  . GASTROESOPHAGEAL REFLUX DISEASE 09/17/2009  . Primary hyperparathyroidism ([place]) 01/20/2009  . Vitamin D deficiency 01/20/2009  . HYPERCALCEMIA 12/30/2008  . Hyperlipidemia LDL goal <100 06/12/2007  . Depression 06/12/2007  . Essential hypertension 06/12/2007  . Osteoporosis 06/12/2007    Past Surgical History:  Procedure Laterality Date  . ABDOMINAL HYSTERECTOMY    . aorta bifem graft    . CHOLECYSTECTOMY    . COLONOSCOPY  06/23/2006   Dr. Fields:Normal colon without evidence of polyps, masses, inflammatory changes/Normal retroflex view of the rectum  . COLONOSCOPY N/A 08/24/2013   Procedure: COLONOSCOPY;  Surgeon: Danie Binder, MD;  Location: [facility] SUITE;  Service: Endoscopy;  Laterality: N/A;  10:30-moved to [place] notified pt  . lysis of adhesions  2000  . PARTIAL HYSTERECTOMY    . right kidney surgery following damage during arterial surgery    .  TOTAL ABDOMINAL HYSTERECTOMY W/ BILATERAL SALPINGOOPHORECTOMY  1975     OB History   None      Home Medications    Prior to Admission medications   Medication Sig Start Date End Date Taking? Authorizing Provider  alendronate (FOSAMAX) 70 MG tablet TAKE 1 TABLET EACH WEEK 30 MIN BEFORE BREAKFAST WITH 8 OUNCES OF WATER FOR OSTEOPOROSIS. Patient  taking differently: Take 70 mg by mouth once a week.  06/05/15   Fayrene Helper, MD  ALPRAZolam (XANAX XR) 0.5 MG 24 hr tablet Take one tablet two times daily for anxiety 12/30/17   Fayrene Helper, MD  amLODipine (NORVASC) 10 MG tablet TAKE ONE TABLET BY MOUTH ONCE DAILY. 01/29/18   Fayrene Helper, MD  aspirin EC 81 MG tablet Take 81 mg by mouth daily.    [provider]  benzonatate (TESSALON) 100 MG capsule TAKE 1 CAPSULE BY MOUTH TWICE DAILY AS NEEDED FOR COUGH. 02/06/18   Fayrene Helper, MD  gabapentin (NEURONTIN) 100 MG capsule TAKE (2) CAPSULES BY MOUTH AT BEDTIME. 01/29/18   Fayrene Helper, MD  HYDROcodone-acetaminophen [facility]) 7.5-325 MG tablet Take one tablet two times daily for back pain 01/29/18 02/28/18  Fayrene Helper, MD  HYDROcodone-acetaminophen [facility]) 7.5-325 MG tablet Take one tablet two times daily for back pain 02/28/18 03/30/18  Fayrene Helper, MD  montelukast (SINGULAIR) 10 MG tablet TAKE (1) TABLET BY MOUTH AT BEDTIME. 11/07/17   Fayrene Helper, MD  Multiple Vitamin (MULTIVITAMIN WITH MINERALS) TABS Take 1 tablet by mouth every evening.     [provider]  rivastigmine (EXELON) 4.6 mg/24hr APPLY 1 PATCH TO THE SKIN DAILY. DISCARD USED PATCH. Patient taking differently: Place 4.6 mg onto the skin at bedtime.  10/03/17   Fayrene Helper, MD  rosuvastatin (CRESTOR) 40 MG tablet Take 1 tablet (40 mg total) by mouth daily. 09/12/17   Fayrene Helper, MD  saccharomyces boulardii (FLORASTOR) 250 MG capsule Take 1 capsule (250 mg total) by mouth 2 (two) times daily. 11/02/17   Barton Dubois, MD    Family History Family History  Problem Relation Age of Onset  . Heart attack Father   . COPD Sister   . Heart disease Sister   . Diabetes Sister   . Coronary artery disease Sister   . Liver cancer Sister 46  . Diabetes Sister   . Diabetes Sister   . COPD Sister   . Colon cancer Neg Hx     Social History Social History    Tobacco Use  . Smoking status: Former Smoker    Packs/day: 3.00    Years: 50.00    Pack years: 150.00    Types: Cigarettes    Last attempt to quit: 12/12/2010    Years since quitting: 7.1  . Smokeless tobacco: Never Used  Substance Use Topics  . Alcohol use: No  . Drug use: No     Allergies   Aricept [donepezil hcl] and Calcium-containing compounds   Review of Systems Review of Systems  Constitutional: Negative.  Negative for chills and fever.  Eyes: Negative.  Negative for visual disturbance.  Musculoskeletal: Positive for neck pain. Negative for back pain and neck stiffness.  Skin: Negative.  Negative for color change and wound.  Neurological: Negative.  Negative for dizziness, syncope, weakness, light-headedness and headaches.   Physical Exam Updated Vital Signs BP 111/66 (BP Location: Right Arm)   Pulse 76   Temp 97.9 F (36.6 C) (Oral)  Resp 16   Ht 5\' 2"  (1.575 m)   Wt 59 kg   SpO2 96%   BMI 23.78 kg/m   Physical Exam  Constitutional: She appears well-developed and well-nourished. No distress.  HENT:  Head: Normocephalic and atraumatic.  Right Ear: External ear normal.  Left Ear: External ear normal.  Nose: Nose normal.  Eyes: Pupils are equal, round, and reactive to light. EOM are normal.  Neck: Trachea normal, normal range of motion and phonation normal. Neck supple. Muscular tenderness present. No tracheal tenderness and no spinous process tenderness present. No tracheal deviation present.    Cardiovascular: Normal rate and regular rhythm.  Pulses:      Radial pulses are 2+ on the right side, and 2+ on the left side.       Dorsalis pedis pulses are 2+ on the right side, and 2+ on the left side.       Posterior tibial pulses are 2+ on the right side, and 2+ on the left side.  Pulmonary/Chest: Effort normal. No respiratory distress. She exhibits no tenderness, no crepitus and no deformity.  Abdominal: Soft. There is no tenderness. There is no  rigidity, no rebound and no guarding.  Musculoskeletal: Normal range of motion.       Right lower leg: Normal.       Left lower leg: Normal.  No midline spinal tenderness to palpation.  No crepitus step-off or deformity of the spine.  No paraspinal thoracic or lumbar muscular tenderness to palpation.  No signs of injury to the back or neck.  With mild right-sided cervical muscular tenderness to palpation.  No sign of injury to this area.  Patient's pain is increased with shrugging the right shoulder and turning her head to the right.  She does have full range of motion however with some increased pain.  Feet:  Right Foot:  Protective Sensation: 3 sites tested. 3 sites sensed.  Left Foot:  Protective Sensation: 3 sites tested. 3 sites sensed.  Neurological: She is alert. GCS eye subscore is 4. GCS verbal subscore is 5. GCS motor subscore is 6.  Speech is clear and goal oriented, follows commands Major Cranial nerves without deficit, no facial droop Normal strength in upper and lower extremities bilaterally including dorsiflexion and plantar flexion, strong and equal grip strength Sensation normal to light touch Moves extremities without ataxia, coordination intact Normal finger to nose and rapid alternating movements No pronator drift, negative Romberg  Normal gait  Skin: Skin is warm and dry.  Psychiatric: She has a normal mood and affect. Her behavior is normal.   ED Treatments / Results  Labs (all labs ordered are listed, but only abnormal results are displayed) Labs Reviewed - No data to display  EKG None  Radiology Dg Cervical Spine Complete  Result Date: 02/20/2018 CLINICAL DATA:  72 year old female with a history of cervical spine pain EXAM: CERVICAL SPINE - COMPLETE 4+ VIEW COMPARISON:  None. FINDINGS: Cervical Spine: Cervical elements maintain relative anatomic alignment from the level of C1-T1. Unremarkable appearance of the craniocervical junction. No subluxation,  anterolisthesis, retrolisthesis. No acute fracture line identified. Vertebral body heights relatively maintained. Degenerative disc space narrowing with endplate sclerosis and anterior osteophyte production is most pronounced at C4-C5, C5-C6, C6-C7. Oblique images demonstrate right greater than left foraminal encroachment secondary to uncovertebral joint disease at C5-C6. Developing facet hypertrophy bilaterally. Open mouth odontoid view unremarkable. Prevertebral soft tissues within normal limits. IMPRESSION: Negative for acute fracture malalignment of the cervical spine. Bilateral right  greater than left foraminal encroachment at C5-C6 secondary to degenerative changes. Electronically Signed   By: Corrie Mckusick D.O.   On: 02/20/2018 12:40    Procedures Procedures (including critical care time)  Medications Ordered in ED Medications  lidocaine (LIDODERM) 5 % 1 patch (1 patch Transdermal Patch Applied 02/20/18 1239)     Initial Impression / Assessment and Plan / ED Course  I have reviewed the triage vital signs and the nursing notes.  Pertinent labs & imaging results that were available during my care of the patient were reviewed by me and considered in my medical decision making (see chart for details).    72 year old female with history of disc herniation presented today for 2 days of right-sided neck pain without known injury.  No other complaints. No neurological deficits and normal neuro exam.  No midline tenderness.  Patient's pain is over the musculature of the right neck.  No tracheal pain or pain around the carotids.  No complaint of headache, dizziness or visual changes.  Patient denies loss of bowel/bladder control or saddle area paresthesias.  No concern for cauda equina.  Imaging today negative for acute fracture malalignment of the cervical spine.  Imaging does show bilateral right greater than left foraminal encroachment at C5-C6 secondary to degenerative changes, patient was  informed of imaging findings and need for PCP follow-up. Patient endorses some relief of pain following Lidoderm patch given in here in the emergency department.  Rest, ice/heat encouraged.  At discharge patient afebrile, not tachycardic, not hypotensive, well-appearing and in no acute distress.  At this time there does not appear to be any evidence of an acute emergency medical condition and the patient appears stable for discharge with appropriate outpatient follow up. Diagnosis was discussed with patient who verbalizes understanding of care plan and is agreeable to discharge. I have discussed return precautions with patient and her husband at bedside who verbalize understanding of return precautions. Patient strongly encouraged to follow-up with their PCP on Monday. All questions answered.  Patient was also seen and evaluated by Dr. Lacinda Axon during this visit who agrees with plan of care, discharge with PCP follow-up at this time.  Note: Portions of this report may have been transcribed using voice recognition software. Every effort was made to ensure accuracy; however, inadvertent computerized transcription errors may still be present. Final Clinical Impressions(s) / ED Diagnoses   Final diagnoses:  Neck muscle strain, initial encounter    ED Discharge Orders    None       Gari Crown 02/20/18 1752    Carla Christen, MD 02/24/18 [phone]

## 2018-02-23 ENCOUNTER — Telehealth: Payer: Self-pay | Admitting: *Deleted

## 2018-02-23 NOTE — Telephone Encounter (Signed)
No appts available until the once that was offered tomorrow. If can't wait until then will have to return to ER

## 2018-02-23 NOTE — Telephone Encounter (Signed)
Pt stated she was seen in ER Friday. She has had neck swelling and is in a lot of pain. Wanted to know if she could be seen today. Advised Dr. Moshe Cipro had an appt tomorrow. She stated she needed to be seen TODAY.

## 2018-02-26 ENCOUNTER — Ambulatory Visit (INDEPENDENT_AMBULATORY_CARE_PROVIDER_SITE_OTHER): Payer: PPO | Admitting: Family Medicine

## 2018-02-26 ENCOUNTER — Encounter: Payer: Self-pay | Admitting: Family Medicine

## 2018-02-26 VITALS — BP 122/70 | HR 87 | Resp 15 | Ht 62.0 in | Wt 131.1 lb

## 2018-02-26 DIAGNOSIS — F32A Depression, unspecified: Secondary | ICD-10-CM

## 2018-02-26 DIAGNOSIS — F329 Major depressive disorder, single episode, unspecified: Secondary | ICD-10-CM | POA: Diagnosis not present

## 2018-02-26 DIAGNOSIS — M62838 Other muscle spasm: Secondary | ICD-10-CM

## 2018-02-26 DIAGNOSIS — M5090 Cervical disc disorder, unspecified, unspecified cervical region: Secondary | ICD-10-CM

## 2018-02-26 DIAGNOSIS — I10 Essential (primary) hypertension: Secondary | ICD-10-CM

## 2018-02-26 MED ORDER — PREDNISONE 10 MG PO TABS: ORAL_TABLET | ORAL | 0 refills | Status: AC

## 2018-02-26 MED ORDER — TIZANIDINE HCL 4 MG PO CAPS: ORAL_CAPSULE | ORAL | 0 refills | Status: AC

## 2018-02-26 NOTE — Patient Instructions (Addendum)
Keep appt as before, 03/04/2018 at 10:40 am  Fasting lab sheet will be handed to you before you leave, and get this no later than next Monday  Muscle relaxant and prednisone short term are sent today for rigth neck spasm and pain

## 2018-03-01 NOTE — Assessment & Plan Note (Signed)
Acute right trapezius spasm, Zanaflex prescribed for bedtime use

## 2018-03-01 NOTE — Progress Notes (Signed)
   Carla [name]     MRN: [id number]      DOB: 1945-06-26   HPI Carla [name] is here for follow up and re-evaluation of neck pain and spasm, was seen in the ED 1 week ago and reports no improvement. No recent trauma, pain radiates from neck to both shoulders, left less  than right and she is experiencing neck spasm. She awoke with the pain ROS Denies recent fever or chills. Denies sinus pressure, nasal congestion, ear pain or sore throat. Denies chest congestion, productive cough or wheezing. Denies chest pains, palpitations and leg swelling Denies abdominal pain, nausea, vomiting,diarrhea or constipation.   Denies dysuria, frequency, hesitancy or incontinence.  Denies depression, anxiety or insomnia.This has improved and she is enjoying her grandchildren Denies skin break down or rash.   PE  BP 122/70   Pulse 87   Resp 15   Ht 5\' 2"  (1.575 m)   Wt 131 lb 1.9 oz (59.5 kg)   SpO2 96%   BMI 23.98 kg/m   Patient alert and oriented and in no cardiopulmonary distress.  HEENT: No facial asymmetry, EOMI,   oropharynx pink and moist.  Neck decreased ROM with right  trapezius spasm no JVD, no mass.  Chest: Clear to auscultation bilaterally.  CVS: S1, S2 no murmurs, no S3.Regular rate.  ABD: Soft non tender.   Ext: No edema  MS: Adequate ROM spine, shoulders, hips and knees.  Skin: Intact, no ulcerations or rash noted.  Psych: Good eye contact, normal affect. Memory intact not anxious or depressed appearing.  CNS: CN 2-12 intact, power,  normal throughout.no focal deficits noted.   Assessment & Plan  Cervical neck pain with evidence of disc disease Current flare , prednisone prescribed for short term use  Neck muscle spasm Acute right trapezius spasm, Zanaflex prescribed for bedtime use  Essential hypertension Controlled, no change in medication DASH diet and commitment to daily physical activity for a minimum of 30 minutes discussed and encouraged, as a part of  hypertension management. The importance of attaining a healthy weight is also discussed.  BP/Weight 02/26/2018 02/20/2018 12/30/2017 11/05/2017 11/03/2017 11/02/2017 06/21/760  Systolic BP 263 335 456 256 389 373 -  Diastolic BP 70 66 74 70 70 58 -  Wt. (Lbs) 131.12 130 127 135.4 133 - 132.28  BMI 23.98 23.78 23.23 24.76 24.33 - 23.43       Depression Improved, though on no anti depressant, reports improved interaction with her children and grand children

## 2018-03-01 NOTE — Assessment & Plan Note (Signed)
Controlled, no change in medication DASH diet and commitment to daily physical activity for a minimum of 30 minutes discussed and encouraged, as a part of hypertension management. The importance of attaining a healthy weight is also discussed.  BP/Weight 02/26/2018 02/20/2018 12/30/2017 11/05/2017 11/03/2017 11/02/2017 12/02/2888  Systolic BP 228 406 986 148 307 354 -  Diastolic BP 70 66 74 70 70 58 -  Wt. (Lbs) 131.12 130 127 135.4 133 - 132.28  BMI 23.98 23.78 23.23 24.76 24.33 - 23.43

## 2018-03-01 NOTE — Assessment & Plan Note (Signed)
Current flare , prednisone prescribed for short term use

## 2018-03-01 NOTE — Assessment & Plan Note (Signed)
Improved, though on no anti depressant, reports improved interaction with her children and grand children

## 2018-03-02 ENCOUNTER — Telehealth: Payer: Self-pay | Admitting: *Deleted

## 2018-03-02 NOTE — Telephone Encounter (Signed)
Pt was seen for her neck. Insurance wouldn't cover medication that was prescribed. The pharmacy faxed over a new order but the pt said she checked with the pharmacy and they still don't have a new order. Pt uses [facility].

## 2018-03-03 NOTE — Telephone Encounter (Signed)
New script send in to change from capsules to tablets.

## 2018-03-04 ENCOUNTER — Other Ambulatory Visit (HOSPITAL_COMMUNITY)
Admission: AD | Admit: 2018-03-04 | Discharge: 2018-03-04 | Disposition: A | Discharge status: Home / Self Care | Payer: PPO | Source: Other Acute Inpatient Hospital | Attending: Family Medicine | Admitting: Family Medicine

## 2018-03-04 ENCOUNTER — Ambulatory Visit (INDEPENDENT_AMBULATORY_CARE_PROVIDER_SITE_OTHER): Payer: PPO | Admitting: Family Medicine

## 2018-03-04 ENCOUNTER — Ambulatory Visit: Payer: PPO | Admitting: Family Medicine

## 2018-03-04 ENCOUNTER — Encounter: Payer: Self-pay | Admitting: Family Medicine

## 2018-03-04 VITALS — BP 118/70 | HR 82 | Resp 15 | Ht 62.0 in | Wt 132.0 lb

## 2018-03-04 DIAGNOSIS — E1165 Type 2 diabetes mellitus with hyperglycemia: Secondary | ICD-10-CM | POA: Insufficient documentation

## 2018-03-04 DIAGNOSIS — F411 Generalized anxiety disorder: Secondary | ICD-10-CM | POA: Diagnosis not present

## 2018-03-04 DIAGNOSIS — G3184 Mild cognitive impairment, so stated: Secondary | ICD-10-CM | POA: Diagnosis not present

## 2018-03-04 DIAGNOSIS — E785 Hyperlipidemia, unspecified: Secondary | ICD-10-CM | POA: Diagnosis not present

## 2018-03-04 DIAGNOSIS — I10 Essential (primary) hypertension: Secondary | ICD-10-CM

## 2018-03-04 DIAGNOSIS — G8929 Other chronic pain: Secondary | ICD-10-CM

## 2018-03-04 MED ORDER — HYDROCODONE-ACETAMINOPHEN 7.5-325 MG PO TABS: ORAL_TABLET | ORAL | 0 refills | Status: AC

## 2018-03-04 MED ORDER — RIVASTIGMINE 9.5 MG/24HR TD PT24: 9.5000 mg | MEDICATED_PATCH | Freq: Every day | TRANSDERMAL | 12 refills | Status: DC

## 2018-03-04 NOTE — Assessment & Plan Note (Signed)
Increase dose of exelon prescribed for next fill and MMSE to be repeated at follow up

## 2018-03-04 NOTE — Assessment & Plan Note (Signed)
Improved aand controlled on current medication

## 2018-03-04 NOTE — Patient Instructions (Addendum)
F/U first week in April, with MMSE and for pain management, call if you need me before  Labs today at [facility]   Dose increase in exelon patch when next fill  No change in pain management  Urine testing from office microalb, and screening   Thank you  for choosing [facility]. We consider it a privelige to serve you.  Delivering excellent health care in a caring and  compassionate way is our goal.  Partnering with you,  so that together we can achieve this goal is our strategy.

## 2018-03-04 NOTE — Progress Notes (Signed)
Fill Date ID Written Drug Qty Days Prescriber Rx # Pharmacy Refill Daily Dose * Pymt Type PMP  03/02/2018  1   12/30/2017  Alprazolam Er 0.5 Mg Tablet  60.00 30 Ma Sim  [id number]  Car ([id number])  1/3 2.00 LME Comm Ins  [place]  02/28/2018  1   01/02/2018  Hydrocodone-Acetamin 7.5-325  60.00 30 Ma Sim  [id number]  Car ([id number])  0/0 15.00 MME Comm Ins  [place]  01/30/2018  1   12/30/2017  Alprazolam Er 0.5 Mg Tablet  60.00 30 Ma Sim  [id number]  Car ([id number])  0/3 2.00 LME Comm Ins  [place]  01/29/2018  1   01/02/2018  Hydrocodone-Acetamin 7.5-325  60.00 30 Ma Sim  [id number]  Car ([id number])  0/0 15.00 MME Comm Ins  [place]  12/30/2017  1   12/30/2017  Hydrocodone-Acetamin 7.5-325  60.00 30 Ma Sim  [id number]  Car ([id number])  0/0 15.00 MME Comm Ins  [place]  12/26/2017  1   12/10/2017  Alprazolam 0.5 Mg Tablet  60.00 30 Ma Sim  [id number]  Car ([id number])  0/0 2.00 LME Comm Ins  [place]  12/01/2017  1   12/01/2017  Alprazolam 0.5 Mg Tablet  60.00 30 Ma Sim  [id number]  Car ([id number])  0/3 2.00 LME Comm Ins  [place]  11/25/2017  1   09/12/2017  Hydrocodone-Acetamin 10-325 Mg  60.00 30 Ma Sim  [id number]  Car ([id number])  0/0 20.00 MME Comm Ins  [place]  10/24/2017  1   06/12/2017  Alprazolam 0.5 Mg Tablet  60.00 30 Ma Sim  [id number]  Car ([id number])  4/4 2.00 LME Comm Ins  [place]  09/26/2017  1   09/12/2017  Hydrocodone-Acetamin 10-325 Mg  60.00 30 Ma Sim  [id number]  Car ([id number])  0/0 20.00 MME Comm Ins  [place]  09/26/2017  1   06/12/2017  Alprazolam 0.5 Mg Tablet  60.00 30 Ma Sim  [id number]  Car ([id number])  3/4 2.00 LME Comm Ins  [place]  08/27/2017  1   06/12/2017  Hydrocodone-Acetamin 10-325 Mg  60.00 30 Ma Sim  [id number]  Car ([id number])  0/0 20.00 MME Comm Ins    08/27/2017  1   06/12/2017  Alprazolam 0.5 Mg Tablet  60.00 30 Ma Sim  [id number]  Car ([id number])  2/4

## 2018-03-04 NOTE — Assessment & Plan Note (Signed)
The patient's Controlled Substance registry is reviewed and compliance confirmed. Adequacy of  Pain control and level of function is assessed. Medication dosing is adjusted as deemed appropriate. Twelve weeks of medication is prescribed , patient signs for the script and is provided with a follow up appointment between 11 to 12 weeks .  

## 2018-03-04 NOTE — Assessment & Plan Note (Signed)
Hyperlipidemia:Low fat diet discussed and encouraged.   Lipid Panel  Lab Results  Component Value Date   CHOL 183 03/04/2018   HDL 70 03/04/2018   LDLCALC 79 03/04/2018   LDLDIRECT 166 (H) 10/11/2009   TRIG 246 (H) 03/04/2018   CHOLHDL 2.6 03/04/2018   Uncontrolled , needs to reduce fried and fatty foods ,

## 2018-03-04 NOTE — Assessment & Plan Note (Signed)
Controlled, no change in medication DASH diet and commitment to daily physical activity for a minimum of 30 minutes discussed and encouraged, as a part of hypertension management. The importance of attaining a healthy weight is also discussed.  BP/Weight 03/04/2018 02/26/2018 02/20/2018 12/30/2017 11/05/2017 11/03/2017 06/07/4006  Systolic BP 676 195 093 267 124 580 998  Diastolic BP 70 70 66 74 70 70 58  Wt. (Lbs) 132 131.12 130 127 135.4 133 -  BMI 24.14 23.98 23.78 23.23 24.76 24.33 -

## 2018-03-04 NOTE — Progress Notes (Signed)
   Carla [name]     MRN: [id number]      DOB: Jul 17, 1945   HPI Carla [name] is here for follow up and re-evaluation of chronic medical conditions, medication management and review of any available recent lab and radiology data.  Preventive health is updated, specifically  Cancer screening and Immunization.   Questions or concerns regarding consultations or procedures which the PT has had in the interim are  addressed. The PT denies any adverse reactions to current medications since the last visit.  There are no new concerns.  There are no specific complaints   ROS Denies recent fever or chills. Denies sinus pressure, nasal congestion, ear pain or sore throat. Denies chest congestion, productive cough or wheezing. Denies chest pains, palpitations and leg swelling Denies abdominal pain, nausea, vomiting,diarrhea or constipation.   Denies dysuria, frequency, hesitancy or incontinence. Denies uncontrolled  joint pain, swelling and limitation in mobility. Denies headaches, seizures, numbness, or tingling. Denies depression, anxiety or insomnia. Denies skin break down or rash.   PE  BP 118/70   Pulse 82   Resp 15   Ht 5\' 2"  (1.575 m)   Wt 132 lb (59.9 kg)   SpO2 97%   BMI 24.14 kg/m   Patient alert and oriented and in no cardiopulmonary distress.  HEENT: No facial asymmetry, EOMI,   oropharynx pink and moist.  Neck supple no JVD, no mass.  Chest: Clear to auscultation bilaterally.  CVS: S1, S2 no murmurs, no S3.Regular rate.  ABD: Soft non tender.   Ext: No edema  MS: Adequate ROM spine, shoulders, hips and knees.  Skin: Intact, no ulcerations or rash noted.  Psych: Good eye contact, normal affect. Memory intact not anxious or depressed appearing.  CNS: CN 2-12 intact, power,  normal throughout.no focal deficits noted.   Assessment & Plan  Encounter for chronic pain management The patient's Controlled Substance registry is reviewed and compliance  confirmed. Adequacy of  Pain control and level of function is assessed. Medication dosing is adjusted as deemed appropriate. Twelve weeks of medication is prescribed , patient signs for the script and is provided with a follow up appointment between 11 to 12 weeks .   Essential hypertension Controlled, no change in medication DASH diet and commitment to daily physical activity for a minimum of 30 minutes discussed and encouraged, as a part of hypertension management. The importance of attaining a healthy weight is also discussed.  BP/Weight 03/04/2018 02/26/2018 02/20/2018 12/30/2017 11/05/2017 11/03/2017 0/53/9767  Systolic BP 341 937 902 409 735 329 924  Diastolic BP 70 70 66 74 70 70 58  Wt. (Lbs) 132 131.12 130 127 135.4 133 -  BMI 24.14 23.98 23.78 23.23 24.76 24.33 -       GAD (generalized anxiety disorder) Improved aand controlled on current medication  MCI (mild cognitive impairment) with memory loss Increase dose of exelon prescribed for next fill and MMSE to be repeated at follow up  Hyperlipidemia LDL goal <100 Hyperlipidemia:Low fat diet discussed and encouraged.   Lipid Panel  Lab Results  Component Value Date   CHOL 183 03/04/2018   HDL 70 03/04/2018   LDLCALC 79 03/04/2018   LDLDIRECT 166 (H) 10/11/2009   TRIG 246 (H) 03/04/2018   CHOLHDL 2.6 03/04/2018   Uncontrolled , needs to reduce fried and fatty foods ,

## 2018-03-05 LAB — COMPLETE METABOLIC PANEL WITH GFR
AG RATIO: 1.6 (calc) (ref 1.0–2.5)
ALBUMIN MSPROF: 4.9 g/dL (ref 3.6–5.1)
ALT: 19 U/L (ref 6–29)
AST: 20 U/L (ref 10–35)
Alkaline phosphatase (APISO): 107 U/L (ref 33–130)
BUN/Creatinine Ratio: 16 (calc) (ref 6–22)
BUN: 16 mg/dL (ref 7–25)
CALCIUM: 11.2 mg/dL — AB (ref 8.6–10.4)
CHLORIDE: 103 mmol/L (ref 98–110)
CO2: 30 mmol/L (ref 20–32)
Creat: 1.02 mg/dL — ABNORMAL HIGH (ref 0.60–0.93)
GFR, EST AFRICAN AMERICAN: 64 mL/min/{1.73_m2} (ref >=60)
GFR, EST NON AFRICAN AMERICAN: 55 mL/min/{1.73_m2} — AB (ref >=60)
GLUCOSE: 102 mg/dL — AB (ref 65–99)
Globulin: 3 g/dL (calc) (ref 1.9–3.7)
POTASSIUM: 4.6 mmol/L (ref 3.5–5.3)
Sodium: 142 mmol/L (ref 135–146)
TOTAL PROTEIN: 7.9 g/dL (ref 6.1–8.1)
Total Bilirubin: 0.5 mg/dL (ref 0.2–1.2)

## 2018-03-05 LAB — LIPID PANEL
CHOL/HDL RATIO: 2.6 (calc) (ref <5.0)
CHOLESTEROL: 183 mg/dL (ref <200)
HDL: 70 mg/dL (ref >50)
LDL Cholesterol (Calc): 79 mg/dL (calc)
Non-HDL Cholesterol (Calc): 113 mg/dL (calc) (ref <130)
Triglycerides: 246 mg/dL — ABNORMAL HIGH (ref <150)

## 2018-03-05 LAB — HEMOGLOBIN A1C
HEMOGLOBIN A1C: 6.4 %{Hb} — AB (ref <5.7)
MEAN PLASMA GLUCOSE: 137 (calc)
eAG (mmol/L): 7.6 (calc)

## 2018-03-05 LAB — MICROALBUMIN, URINE: Microalb, Ur: 46 ug/mL — ABNORMAL HIGH

## 2018-03-09 ENCOUNTER — Other Ambulatory Visit: Payer: Self-pay | Admitting: Family Medicine

## 2018-03-20 ENCOUNTER — Other Ambulatory Visit: Payer: Self-pay | Admitting: Family Medicine

## 2018-03-30 ENCOUNTER — Other Ambulatory Visit: Payer: Self-pay | Admitting: Family Medicine

## 2018-04-01 ENCOUNTER — Other Ambulatory Visit: Payer: Self-pay | Admitting: Family Medicine

## 2018-04-06 ENCOUNTER — Other Ambulatory Visit: Payer: Self-pay | Admitting: Family Medicine

## 2018-04-17 ENCOUNTER — Other Ambulatory Visit: Payer: Self-pay | Admitting: Family Medicine

## 2018-04-28 ENCOUNTER — Telehealth: Payer: Self-pay | Admitting: Family Medicine

## 2018-04-28 NOTE — Telephone Encounter (Signed)
Carla [name]- looked and PT is eligible for RX tomorrow, advised patient. However she said she was hurting.

## 2018-04-28 NOTE — Telephone Encounter (Signed)
Pt stopped by and would like her pain medicine sent in--[phone], please call her Cell phone

## 2018-04-28 NOTE — Telephone Encounter (Signed)
pld call and explain she will be able to fill ON THE DUE dATE, that is what a pain contract means

## 2018-05-25 ENCOUNTER — Other Ambulatory Visit: Payer: Self-pay | Admitting: Family Medicine

## 2018-05-28 ENCOUNTER — Other Ambulatory Visit: Payer: Self-pay | Admitting: Family Medicine

## 2018-05-28 DIAGNOSIS — F411 Generalized anxiety disorder: Secondary | ICD-10-CM

## 2018-06-05 ENCOUNTER — Other Ambulatory Visit: Payer: Self-pay | Admitting: Family Medicine

## 2018-06-11 ENCOUNTER — Other Ambulatory Visit: Payer: Self-pay | Admitting: Family Medicine

## 2018-06-29 ENCOUNTER — Ambulatory Visit (INDEPENDENT_AMBULATORY_CARE_PROVIDER_SITE_OTHER): Payer: PPO | Admitting: Family Medicine

## 2018-06-29 ENCOUNTER — Encounter: Payer: Self-pay | Admitting: Family Medicine

## 2018-06-29 ENCOUNTER — Other Ambulatory Visit: Payer: Self-pay

## 2018-06-29 VITALS — BP 120/70 | Ht 62.0 in | Wt 132.0 lb

## 2018-06-29 DIAGNOSIS — J302 Other seasonal allergic rhinitis: Secondary | ICD-10-CM

## 2018-06-29 DIAGNOSIS — I10 Essential (primary) hypertension: Secondary | ICD-10-CM | POA: Diagnosis not present

## 2018-06-29 DIAGNOSIS — G8929 Other chronic pain: Secondary | ICD-10-CM

## 2018-06-29 DIAGNOSIS — E1151 Type 2 diabetes mellitus with diabetic peripheral angiopathy without gangrene: Secondary | ICD-10-CM

## 2018-06-29 DIAGNOSIS — G3184 Mild cognitive impairment, so stated: Secondary | ICD-10-CM | POA: Diagnosis not present

## 2018-06-29 DIAGNOSIS — F411 Generalized anxiety disorder: Secondary | ICD-10-CM | POA: Diagnosis not present

## 2018-06-29 MED ORDER — MONTELUKAST SODIUM 10 MG PO TABS: 10.0000 mg | ORAL_TABLET | Freq: Every day | ORAL | 3 refills | Status: DC

## 2018-06-29 MED ORDER — HYDROCODONE-ACETAMINOPHEN 7.5-325 MG PO TABS: ORAL_TABLET | ORAL | 0 refills | Status: AC

## 2018-06-29 MED ORDER — ROSUVASTATIN CALCIUM 40 MG PO TABS: 40.0000 mg | ORAL_TABLET | Freq: Every day | ORAL | 3 refills | Status: DC

## 2018-06-29 MED ORDER — ALPRAZOLAM 0.5 MG PO TABS: 0.5000 mg | ORAL_TABLET | Freq: Two times a day (BID) | ORAL | 5 refills | Status: DC

## 2018-06-29 NOTE — Assessment & Plan Note (Signed)
Intolerant of exelon patch and aricept by mouth, will re address at f/u currently on no medication effective today

## 2018-06-29 NOTE — Assessment & Plan Note (Signed)
Controlled, no change in medication  

## 2018-06-29 NOTE — Assessment & Plan Note (Addendum)
Carla [name] is reminded of the importance of commitment to daily physical activity for 30 minutes or more, as able and the need to limit carbohydrate intake to 30 to 60 grams per meal to help with blood sugar control.   Carla [name] is reminded of the importance of daily foot exam, annual eye examination, and good blood sugar, blood pressure and cholesterol control. Updated lab needed at/ before next visit.   Diabetic Labs Latest Ref Rng & Units 03/04/2018 11/05/2017 11/02/2017 11/01/2017 09/10/2017  HbA1c <5.7 % of total Hgb 6.4(H) - - 6.5(H) 6.3(H)  Microalbumin Not Estab. ug/mL 46.0(H) - - - -  Micro/Creat Ratio 0.0 - 30.0 mg/g creat - - - - -  Chol <200 mg/dL 183 - - - 202(H)  HDL >50 mg/dL 70 - - - 45(L)  Calc LDL mg/dL (calc) 79 - - - 116(H)  Triglycerides <150 mg/dL 246(H) - - - 307(H)  Creatinine 0.60 - 0.93 mg/dL 1.02(H) 1.08(H) 0.83 0.97 0.87   BP/Weight 06/29/2018 03/04/2018 02/26/2018 02/20/2018 12/30/2017 11/05/2017 9/52/8413  Systolic BP 244 010 272 536 644 034 742  Diastolic BP 70 70 70 66 74 70 70  Wt. (Lbs) 132 132 131.12 130 127 135.4 133  BMI 24.14 24.14 23.98 23.78 23.23 24.76 24.33   Foot/eye exam completion dates Latest Ref Rng & Units 09/10/2017 07/23/2016  Eye Exam No Retinopathy - -  Foot Form Completion - Done Done

## 2018-06-29 NOTE — Assessment & Plan Note (Signed)
The patient's Controlled Substance registry is reviewed and compliance confirmed. Adequacy of  Pain control and level of function is assessed. Medication dosing is adjusted as deemed appropriate. Twelve weeks of medication is prescribed , patient signs for the script and is provided with a follow up appointment between 11 to 12 weeks .  

## 2018-06-29 NOTE — Assessment & Plan Note (Signed)
Increased symptoms , needs to commit to daily Singulair and allergy avoidance as able

## 2018-06-29 NOTE — Progress Notes (Signed)
Virtual Visit via Telephone Note  I connected with Carla [name] on 06/29/18 at  1:40 PM EDT by telephone and verified that I am speaking with the correct person using two identifiers.   I discussed the limitations, risks, security and privacy concerns of performing an evaluation and management service by telephone and the availability of in person appointments. I also discussed with the patient that there may be a patient responsible charge related to this service. The patient expressed understanding and agreed to proceed. Patient was in her car, had tried to get into the office, and I am in my home Facial contact desired, but unfortunately, not possible   History of Present Illness: C/o rash where she uses patch for memory and was intolerant of Aricept by mouth , will re assess at follow up C/o increased allergic cough x 1 week,  Denies fever , chills or sputum production Needs refill on medications Denies recent fever or chills. Denies sinus pressure, , ear pain or sore throat. Denies chest congestion, productive cough or wheezing. Denies chest pains, palpitations and leg swelling Denies abdominal pain, nausea, vomiting,diarrhea or constipation.   Denies dysuria, frequency, hesitancy or incontinence. Denies uncontrolled joint pain, swelling and limitation in mobility. Denies headaches, seizures, numbness, or tingling. Denies depression, anxiety or insomnia.       Observations/Objective: BP 120/70   Ht 5\' 2"  (1.575 m)   Wt 132 lb (59.9 kg)   BMI 24.14 kg/m    Assessment and Plan: Encounter for chronic pain management The patient's Controlled Substance registry is reviewed and compliance confirmed. Adequacy of  Pain control and level of function is assessed. Medication dosing is adjusted as deemed appropriate. Twelve weeks of medication is prescribed , patient signs for the script and is provided with a follow up appointment between 11 to 12 weeks .   GAD (generalized  anxiety disorder) Controlled, no change in medication   Well controlled type 2 diabetes mellitus with peripheral circulatory disorder ([place]) Carla [name] is reminded of the importance of commitment to daily physical activity for 30 minutes or more, as able and the need to limit carbohydrate intake to 30 to 60 grams per meal to help with blood sugar control.   Carla [name] is reminded of the importance of daily foot exam, annual eye examination, and good blood sugar, blood pressure and cholesterol control. Updated lab needed at/ before next visit.   Diabetic Labs Latest Ref Rng & Units 03/04/2018 11/05/2017 11/02/2017 11/01/2017 09/10/2017  HbA1c <5.7 % of total Hgb 6.4(H) - - 6.5(H) 6.3(H)  Microalbumin Not Estab. ug/mL 46.0(H) - - - -  Micro/Creat Ratio 0.0 - 30.0 mg/g creat - - - - -  Chol <200 mg/dL 183 - - - 202(H)  HDL >50 mg/dL 70 - - - 45(L)  Calc LDL mg/dL (calc) 79 - - - 116(H)  Triglycerides <150 mg/dL 246(H) - - - 307(H)  Creatinine 0.60 - 0.93 mg/dL 1.02(H) 1.08(H) 0.83 0.97 0.87   BP/Weight 06/29/2018 03/04/2018 02/26/2018 02/20/2018 12/30/2017 11/05/2017 04/12/1476  Systolic BP 295 621 308 657 846 962 952  Diastolic BP 70 70 70 66 74 70 70  Wt. (Lbs) 132 132 131.12 130 127 135.4 133  BMI 24.14 24.14 23.98 23.78 23.23 24.76 24.33   Foot/eye exam completion dates Latest Ref Rng & Units 09/10/2017 07/23/2016  Eye Exam No Retinopathy - -  Foot Form Completion - Done Done        Seasonal allergies Increased symptoms , needs to commit  to daily Singulair and allergy avoidance as able  MCI (mild cognitive impairment) with memory loss Intolerant of exelon patch and aricept by mouth, will re address at f/u currently on no medication effective today  Essential hypertension Controlled, no change in medication DASH diet and commitment to daily physical activity for a minimum of 30 minutes discussed and encouraged, as a part of hypertension management. The importance of attaining a  healthy weight is also discussed.  BP/Weight 06/29/2018 03/04/2018 02/26/2018 02/20/2018 12/30/2017 11/05/2017 0/81/4481  Systolic BP 856 314 970 263 785 885 027  Diastolic BP 70 70 70 66 74 70 70  Wt. (Lbs) 132 132 131.12 130 127 135.4 133  BMI 24.14 24.14 23.98 23.78 23.23 24.76 24.33         Follow Up Instructions:    I discussed the assessment and treatment plan with the patient. The patient was provided an opportunity to ask questions and all were answered. The patient agreed with the plan and demonstrated an understanding of the instructions.   The patient was advised to call back or seek an in-person evaluation if the symptoms worsen or if the condition fails to improve as anticipated.  I provided 25 minutes of non-face-to-face time during this encounter.   Tula Nakayama, MD

## 2018-06-29 NOTE — Patient Instructions (Addendum)
F/U with MMSE and chronic pain and disease management last week in June, call if you need me sooner  Medications are prescribed as discussed.  Please stop the exelon patch since you report skin irritation  Please get labs drawn fasting,  1 week before next visit, fasting lipid, cmp and EGFr, HBa1C   Thanks for choosing [facility], we consider it a privelige to serve you.

## 2018-06-29 NOTE — Assessment & Plan Note (Signed)
Controlled, no change in medication DASH diet and commitment to daily physical activity for a minimum of 30 minutes discussed and encouraged, as a part of hypertension management. The importance of attaining a healthy weight is also discussed.  BP/Weight 06/29/2018 03/04/2018 02/26/2018 02/20/2018 12/30/2017 11/05/2017 7/89/3810  Systolic BP 175 102 585 277 824 235 361  Diastolic BP 70 70 70 66 74 70 70  Wt. (Lbs) 132 132 131.12 130 127 135.4 133  BMI 24.14 24.14 23.98 23.78 23.23 24.76 24.33

## 2018-06-29 NOTE — Addendum Note (Signed)
Addended by: Eual Fines on: 06/29/2018 03:05 PM   Modules accepted: Orders

## 2018-07-25 ENCOUNTER — Other Ambulatory Visit: Payer: Self-pay | Admitting: Family Medicine

## 2018-08-04 ENCOUNTER — Encounter: Payer: Self-pay | Admitting: *Deleted

## 2018-08-26 ENCOUNTER — Encounter: Payer: Self-pay | Admitting: Gastroenterology

## 2018-09-15 ENCOUNTER — Telehealth: Payer: Self-pay | Admitting: Family Medicine

## 2018-09-15 NOTE — Telephone Encounter (Signed)
Patient needs a refill on Gabapentin. Can this be refilled?

## 2018-09-15 NOTE — Telephone Encounter (Signed)
Please call the patient regarding Gabapentin & Acyclovir

## 2018-09-15 NOTE — Telephone Encounter (Signed)
Returned patients call. No answer. Left generic message requesting call back.  

## 2018-09-15 NOTE — Telephone Encounter (Signed)
Yes if due

## 2018-09-16 ENCOUNTER — Other Ambulatory Visit: Payer: Self-pay

## 2018-09-16 MED ORDER — GABAPENTIN 100 MG PO CAPS: ORAL_CAPSULE | ORAL | 3 refills | Status: DC

## 2018-09-16 NOTE — Telephone Encounter (Signed)
Medication refilled

## 2018-09-21 ENCOUNTER — Encounter (HOSPITAL_COMMUNITY): Payer: Self-pay | Admitting: Emergency Medicine

## 2018-09-21 ENCOUNTER — Other Ambulatory Visit: Payer: Self-pay

## 2018-09-21 ENCOUNTER — Ambulatory Visit: Payer: PPO | Admitting: Family Medicine

## 2018-09-21 ENCOUNTER — Emergency Department (HOSPITAL_COMMUNITY)
Admission: EM | Admit: 2018-09-21 | Discharge: 2018-09-21 | Disposition: A | Discharge status: Home / Self Care | Payer: PPO | Attending: Emergency Medicine | Admitting: Emergency Medicine

## 2018-09-21 ENCOUNTER — Encounter (INDEPENDENT_AMBULATORY_CARE_PROVIDER_SITE_OTHER): Payer: Self-pay

## 2018-09-21 ENCOUNTER — Ambulatory Visit (INDEPENDENT_AMBULATORY_CARE_PROVIDER_SITE_OTHER): Payer: PPO | Admitting: Family Medicine

## 2018-09-21 ENCOUNTER — Encounter: Payer: Self-pay | Admitting: Family Medicine

## 2018-09-21 VITALS — BP 100/42 | HR 81 | Temp 98.5°F | Resp 18 | Ht 62.0 in | Wt 129.0 lb

## 2018-09-21 DIAGNOSIS — R6883 Chills (without fever): Secondary | ICD-10-CM | POA: Diagnosis not present

## 2018-09-21 DIAGNOSIS — R531 Weakness: Secondary | ICD-10-CM | POA: Diagnosis not present

## 2018-09-21 DIAGNOSIS — F411 Generalized anxiety disorder: Secondary | ICD-10-CM | POA: Diagnosis not present

## 2018-09-21 DIAGNOSIS — Z5321 Procedure and treatment not carried out due to patient leaving prior to being seen by health care provider: Secondary | ICD-10-CM | POA: Diagnosis not present

## 2018-09-21 DIAGNOSIS — F410 Panic disorder [episodic paroxysmal anxiety] without agoraphobia: Secondary | ICD-10-CM | POA: Diagnosis not present

## 2018-09-21 DIAGNOSIS — R0602 Shortness of breath: Secondary | ICD-10-CM | POA: Insufficient documentation

## 2018-09-21 LAB — CBC
HCT: 44.3 % (ref 36.0–46.0)
Hemoglobin: 14.3 g/dL (ref 12.0–15.0)
MCH: 29.7 pg (ref 26.0–34.0)
MCHC: 32.3 g/dL (ref 30.0–36.0)
MCV: 91.9 fL (ref 80.0–100.0)
Platelets: 364 10*3/uL (ref 150–400)
RBC: 4.82 MIL/uL (ref 3.87–5.11)
RDW: 13.9 % (ref 11.5–15.5)
WBC: 9 10*3/uL (ref 4.0–10.5)
nRBC: 0 % (ref 0.0–0.2)

## 2018-09-21 LAB — BASIC METABOLIC PANEL
Anion gap: 12 (ref 5–15)
BUN: 18 mg/dL (ref 8–23)
CO2: 25 mmol/L (ref 22–32)
Calcium: 10.3 mg/dL (ref 8.9–10.3)
Chloride: 104 mmol/L (ref 98–111)
Creatinine, Ser: 1.34 mg/dL — ABNORMAL HIGH (ref 0.44–1.00)
GFR calc Af Amer: 45 mL/min — ABNORMAL LOW (ref >60)
GFR calc non Af Amer: 39 mL/min — ABNORMAL LOW (ref >60)
Glucose, Bld: 116 mg/dL — ABNORMAL HIGH (ref 70–99)
Potassium: 3.6 mmol/L (ref 3.5–5.1)
Sodium: 141 mmol/L (ref 135–145)

## 2018-09-21 NOTE — ED Notes (Signed)
Pt was called for room twice.  Pt not present in waiting room

## 2018-09-21 NOTE — Assessment & Plan Note (Signed)
2 day h/o acute onset generalized weakness Office EKG , sinus rhythm ,no acute ischemia, non specific t wave abn, reassuring that symptoms are not due to underlying CVD, however , will refer  pt to ED for further eval

## 2018-09-21 NOTE — Assessment & Plan Note (Signed)
Severe anxiety, and fear with new onset dyspnea, cough and chills by pt reporting, mental componenet to her symptoms is  High on list of differentials

## 2018-09-21 NOTE — Patient Instructions (Signed)
Please go directly to the eD today because of your symptoms. I w have discussed with the Doctor that you are on your way for further eva;luation   I hope that you feel better soon

## 2018-09-21 NOTE — Progress Notes (Signed)
   Carla [name]     MRN: [id number]      DOB: 06-07-1945   HPI Carla [name] is here 2 day h./o shortness of breath,posterior neck pain, chills  And cough. States unable to breathe and feels very weak. No known sick contact.Also new is headache , denies any new weakness or numbness. ROS Denies chest pains,  and leg swelling Denies abdominal pain, nausea, vomiting,diarrhea or constipation.   Denies dysuria, frequency, hesitancy or incontinence. . Denies skin break down or rash.   PE  BP (!) 100/42   Pulse 81   Temp 98.5 F (36.9 C) (Oral)   Resp 18   Ht 5\' 2"  (1.575 m)   Wt 129 lb (58.5 kg)   SpO2 98%   BMI 23.59 kg/m    Patient alert and oriented and in no cardiopulmonary distress.Extremely anxious  HEENT: No facial asymmetry, EOMI,   oropharynx pink and moist.  Neck supple no JVD, no mass.  Chest: Clear to auscultation bilaterally.  CVS: S1, S2 no murmurs, no S3.Regular rate.  ABD: Soft non tender.   Ext: No edema  MS: Adequate ROM spine, shoulders, hips and knees.  Skin: Intact, no ulcerations or rash noted.  Psych: Good eye contact,  anxious not  depressed appearing.  CNS: CN 2-12 intact, power,  normal throughout.no focal deficits noted.   Assessment & Plan Anxiety attack Severe anxiety, and fear with new onset dyspnea, cough and chills by pt reporting, mental componenet to her symptoms is  High on list of differentials  GAD (generalized anxiety disorder) Possibly over corrected, hypotensive with c/o weakness and fatigue, will address at next visit  Generalized weakness 2 day h/o acute onset generalized weakness Office EKG , sinus rhythm ,no acute ischemia, non specific t wave abn, reassuring that symptoms are not due to underlying CVD, however , will refer  pt to ED for further eval

## 2018-09-21 NOTE — Assessment & Plan Note (Signed)
Possibly over corrected, hypotensive with c/o weakness and fatigue, will address at next visit

## 2018-09-21 NOTE — ED Triage Notes (Signed)
Pt c/o of weakness x 3 days.  Denies any sob, cough or fever

## 2018-09-22 ENCOUNTER — Emergency Department (HOSPITAL_COMMUNITY)
Admission: EM | Admit: 2018-09-22 | Discharge: 2018-09-22 | Disposition: A | Discharge status: Home / Self Care | Payer: PPO | Attending: Emergency Medicine | Admitting: Emergency Medicine

## 2018-09-22 ENCOUNTER — Emergency Department (HOSPITAL_COMMUNITY): Payer: PPO

## 2018-09-22 ENCOUNTER — Other Ambulatory Visit: Payer: Self-pay

## 2018-09-22 ENCOUNTER — Encounter (HOSPITAL_COMMUNITY): Payer: Self-pay | Admitting: *Deleted

## 2018-09-22 ENCOUNTER — Telehealth: Payer: Self-pay | Admitting: *Deleted

## 2018-09-22 DIAGNOSIS — M545 Low back pain, unspecified: Secondary | ICD-10-CM

## 2018-09-22 DIAGNOSIS — E876 Hypokalemia: Secondary | ICD-10-CM | POA: Diagnosis not present

## 2018-09-22 DIAGNOSIS — E119 Type 2 diabetes mellitus without complications: Secondary | ICD-10-CM | POA: Insufficient documentation

## 2018-09-22 DIAGNOSIS — I10 Essential (primary) hypertension: Secondary | ICD-10-CM | POA: Diagnosis not present

## 2018-09-22 DIAGNOSIS — Z87891 Personal history of nicotine dependence: Secondary | ICD-10-CM | POA: Diagnosis not present

## 2018-09-22 DIAGNOSIS — I2510 Atherosclerotic heart disease of native coronary artery without angina pectoris: Secondary | ICD-10-CM | POA: Insufficient documentation

## 2018-09-22 DIAGNOSIS — J439 Emphysema, unspecified: Secondary | ICD-10-CM | POA: Diagnosis not present

## 2018-09-22 DIAGNOSIS — N1339 Other hydronephrosis: Secondary | ICD-10-CM | POA: Insufficient documentation

## 2018-09-22 DIAGNOSIS — M542 Cervicalgia: Secondary | ICD-10-CM | POA: Diagnosis not present

## 2018-09-22 DIAGNOSIS — N3001 Acute cystitis with hematuria: Secondary | ICD-10-CM | POA: Insufficient documentation

## 2018-09-22 DIAGNOSIS — Z7982 Long term (current) use of aspirin: Secondary | ICD-10-CM | POA: Insufficient documentation

## 2018-09-22 DIAGNOSIS — Z79899 Other long term (current) drug therapy: Secondary | ICD-10-CM | POA: Diagnosis not present

## 2018-09-22 DIAGNOSIS — K5792 Diverticulitis of intestine, part unspecified, without perforation or abscess without bleeding: Secondary | ICD-10-CM | POA: Insufficient documentation

## 2018-09-22 DIAGNOSIS — N1330 Unspecified hydronephrosis: Secondary | ICD-10-CM | POA: Diagnosis not present

## 2018-09-22 LAB — URINALYSIS, ROUTINE W REFLEX MICROSCOPIC
Bilirubin Urine: NEGATIVE
Glucose, UA: NEGATIVE mg/dL
Ketones, ur: NEGATIVE mg/dL
Nitrite: POSITIVE — AB
Protein, ur: 30 mg/dL — AB
Specific Gravity, Urine: 1.01 (ref 1.005–1.030)
WBC, UA: >50 WBC/hpf — ABNORMAL HIGH (ref 0–5)
pH: 5 (ref 5.0–8.0)

## 2018-09-22 LAB — CBC WITH DIFFERENTIAL/PLATELET
Abs Immature Granulocytes: 0.03 10*3/uL (ref 0.00–0.07)
Basophils Absolute: 0.1 10*3/uL (ref 0.0–0.1)
Basophils Relative: 1 %
Eosinophils Absolute: 0.1 10*3/uL (ref 0.0–0.5)
Eosinophils Relative: 1 %
HCT: 41.2 % (ref 36.0–46.0)
Hemoglobin: 13.5 g/dL (ref 12.0–15.0)
Immature Granulocytes: 0 %
Lymphocytes Relative: 27 %
Lymphs Abs: 2.4 10*3/uL (ref 0.7–4.0)
MCH: 29.9 pg (ref 26.0–34.0)
MCHC: 32.8 g/dL (ref 30.0–36.0)
MCV: 91.2 fL (ref 80.0–100.0)
Monocytes Absolute: 0.5 10*3/uL (ref 0.1–1.0)
Monocytes Relative: 6 %
Neutro Abs: 5.9 10*3/uL (ref 1.7–7.7)
Neutrophils Relative %: 65 %
Platelets: 347 10*3/uL (ref 150–400)
RBC: 4.52 MIL/uL (ref 3.87–5.11)
RDW: 13.6 % (ref 11.5–15.5)
WBC: 9 10*3/uL (ref 4.0–10.5)
nRBC: 0 % (ref 0.0–0.2)

## 2018-09-22 LAB — BASIC METABOLIC PANEL
Anion gap: 8 (ref 5–15)
BUN: 19 mg/dL (ref 8–23)
CO2: 25 mmol/L (ref 22–32)
Calcium: 9.8 mg/dL (ref 8.9–10.3)
Chloride: 106 mmol/L (ref 98–111)
Creatinine, Ser: 1.24 mg/dL — ABNORMAL HIGH (ref 0.44–1.00)
GFR calc Af Amer: 50 mL/min — ABNORMAL LOW (ref >60)
GFR calc non Af Amer: 43 mL/min — ABNORMAL LOW (ref >60)
Glucose, Bld: 110 mg/dL — ABNORMAL HIGH (ref 70–99)
Potassium: 2.9 mmol/L — ABNORMAL LOW (ref 3.5–5.1)
Sodium: 139 mmol/L (ref 135–145)

## 2018-09-22 MED ORDER — NITROFURANTOIN MONOHYD MACRO 100 MG PO CAPS: 100.0000 mg | ORAL_CAPSULE | Freq: Two times a day (BID) | ORAL | 0 refills | Status: DC

## 2018-09-22 MED ORDER — NITROFURANTOIN MONOHYD MACRO 100 MG PO CAPS
100.0000 mg | ORAL_CAPSULE | Freq: Once | ORAL | Status: AC
  Administered 2018-09-22: 100 mg via ORAL
  Filled 2018-09-22: qty 1

## 2018-09-22 MED ORDER — POTASSIUM CHLORIDE CRYS ER 20 MEQ PO TBCR
40.0000 meq | EXTENDED_RELEASE_TABLET | Freq: Once | ORAL | Status: AC
  Administered 2018-09-22: 40 meq via ORAL
  Filled 2018-09-22: qty 2

## 2018-09-22 MED ORDER — POTASSIUM CHLORIDE CRYS ER 20 MEQ PO TBCR: 20.0000 meq | EXTENDED_RELEASE_TABLET | Freq: Two times a day (BID) | ORAL | 0 refills | Status: DC

## 2018-09-22 NOTE — Telephone Encounter (Signed)
She reported being extremely weak and having breathing difficulties, it is VITAL shehave complete evaluation which is not possible in the office with symptoms that she is having, needs to return to ED

## 2018-09-22 NOTE — Telephone Encounter (Signed)
Unable to reach patient by phone. Left message with dr simpsons recommendation

## 2018-09-22 NOTE — Discharge Instructions (Signed)
As discussed you have a urinary infection which can make you feel pretty bad.  You need to complete the entire antibiotic prescription.  Make sure you are drinking plenty of fluids to help flush this infection out.  Get rechecked if you develop fevers, vomiting or worsening weakness, as these can be signs your infection is not getting better.  Your xrays are negative for a kidney infection or kidney stone.  You have a stricture in your right ureter (kidney tube) which has been present and unchanged since at least the past 5 years so this is unlikely to be the source of your pain.  Plan a follow up with your doctor in 10 days for a repeat urinalysis.  Your potassium is low today, please take the supplement prescribed for the next week.

## 2018-09-22 NOTE — Telephone Encounter (Signed)
Pt called stated she was in the office yesterday went to the ER. She could not wait that long at ER so she left without being seen. Wanted to know if Dr. Moshe Cipro would see her again? Spoke with Velna Hatchet who advised she go back to ER to be evaluated. She stated she could not go to ER and she wanted to be seen by Dr. Moshe Cipro this week in the office as she could not wait in the ER. Requesting a call back.

## 2018-09-22 NOTE — Telephone Encounter (Signed)
Please advise. I tried to get her to return to the ER to be evaluated as you recommended yesterday but per [facility], pt wanting to be seen here again

## 2018-09-22 NOTE — ED Notes (Signed)
Patient transported to CT 

## 2018-09-22 NOTE — ED Triage Notes (Addendum)
Pt c/o right sided neck pain that radiates into the shoulder and down her back x 1 week. Pt denies numbness/tingling in her arm. Pt reports pain is worse with movement. Denies chest pain, SOB.

## 2018-09-23 ENCOUNTER — Telehealth (HOSPITAL_COMMUNITY): Payer: Self-pay | Admitting: Emergency Medicine

## 2018-09-23 MED ORDER — METRONIDAZOLE 500 MG PO TABS: 500.0000 mg | ORAL_TABLET | Freq: Three times a day (TID) | ORAL | 0 refills | Status: AC

## 2018-09-23 MED ORDER — CIPROFLOXACIN HCL 500 MG PO TABS: 500.0000 mg | ORAL_TABLET | Freq: Two times a day (BID) | ORAL | 0 refills | Status: DC

## 2018-09-23 NOTE — Telephone Encounter (Signed)
With completion of note, Ct imaging suggestive of diverticulitis without abscess.  Change in antibiotic, pt asked to dc macrobid, cipro and flagyl in its place to cover both infections.  Pt was informed of this change and new scripts sent to pharmacy.

## 2018-09-23 NOTE — ED Provider Notes (Signed)
[facility] EMERGENCY DEPARTMENT Provider Note   CSN: [id number] Arrival date & time: 09/22/18  1022    History   Chief Complaint Chief Complaint  Patient presents with   Neck Pain    HPI Carla [name] is a 73 y.o. female with a history of CAD, known cervical disc herniation with chronic neck and back pain, depression, GERD, HTN, DM, chronic right hydronephrosis presenting with intermittent stabs of sharp right lower back pain which radiates into her right neck/shoulder area.  Her symptoms are not triggered by movement, stating she can be sitting still and have an episode, which generally lasts for a minute or less.  She was seen by her pcp yesterday at which time she described neck pain radiating down, but also accompanied by sob, cough and generalized weakness which she says is no longer present today.  She denies fevers or chills, also no weakness or numbness in her arms or legs.  She denies cp, abdominal pain or distention. No hematuria or dysuria, no hx of kidney stones.  She reports an episode of pain just prior to my entering the room but is currently pain free.     The history is provided by the patient.    Past Medical History:  Diagnosis Date   ASCVD (arteriosclerotic cardiovascular disease)    No critical disease on cath in 8/97: mild AI with trival, if any l AS; negative stress nuclear in 2010   Back pain    Cervical disc herniation    Chronic bronchitis    Colon polyps    Depression    Elevated hemoglobin A1c    Fasting hyperglycemia    GERD (gastroesophageal reflux disease)    HSV (herpes simplex virus) infection    Hydronephrosis    Hypertension    Osteoporosis    Peripheral vascular disease ([place])    stauts post aortabifemoral graft     Secondary erythrocytosis 08/05/2012   Secondary to COPD   Tobacco abuse    has stopped    Patient Active Problem List   Diagnosis Date Noted   Weakness 09/21/2018   Chills (without fever) 09/21/2018    Shortness of breath 09/21/2018   Anxiety attack 09/21/2018   Generalized weakness 09/21/2018   Type 2 diabetes mellitus with hyperglycemia ([place]) 11/01/2017   Hydronephrosis of right kidney 11/01/2017   MCI (mild cognitive impairment) with memory loss 01/27/2017   Tennis Must Quervain's disease (radial styloid tenosynovitis) 10/14/2016   Atrophic vaginitis 04/18/2016   Onychomycosis 07/08/2015   Western blot positive HSV2 02/12/2015   Neck muscle spasm 04/23/2014   GAD (generalized anxiety disorder) 02/13/2014   Well controlled type 2 diabetes mellitus with peripheral circulatory disorder ([place]) 03/31/2013   Seasonal allergies 03/30/2013   Cervical neck pain with evidence of disc disease 11/03/2012   Secondary erythrocytosis 08/05/2012   Encounter for chronic pain management 04/14/2012   Hearing loss 04/13/2012   Carotid bruit 04/13/2012   Back pain with radiation 10/15/2011   Emphysema with chronic bronchitis ([place]) 01/17/2011   Arteriosclerotic cardiovascular disease (ASCVD) 09/17/2009   PERIPHERAL VASCULAR DISEASE 09/17/2009   GASTROESOPHAGEAL REFLUX DISEASE 09/17/2009   Primary hyperparathyroidism ([place]) 01/20/2009   Vitamin D deficiency 01/20/2009   HYPERCALCEMIA 12/30/2008   Hyperlipidemia LDL goal <100 06/12/2007   Depression 06/12/2007   Essential hypertension 06/12/2007   Osteoporosis 06/12/2007    Past Surgical History:  Procedure Laterality Date   ABDOMINAL HYSTERECTOMY     aorta bifem graft     CHOLECYSTECTOMY  COLONOSCOPY  06/23/2006   Dr. Fields:Normal colon without evidence of polyps, masses, inflammatory changes/Normal retroflex view of the rectum   COLONOSCOPY N/A 08/24/2013   Procedure: COLONOSCOPY;  Surgeon: Danie Binder, MD;  Location: [facility] ENDO SUITE;  Service: Endoscopy;  Laterality: N/A;  10:30-moved to [place] notified pt   lysis of adhesions  2000   PARTIAL HYSTERECTOMY     right kidney surgery following damage  during arterial surgery     TOTAL ABDOMINAL HYSTERECTOMY W/ BILATERAL SALPINGOOPHORECTOMY  1975     OB History   No obstetric history on file.      Home Medications    Prior to Admission medications   Medication Sig Start Date End Date Taking? Authorizing Provider  alendronate (FOSAMAX) 70 MG tablet TAKE 1 TABLET EACH WEEK 30 MIN BEFORE BREAKFAST WITH 8 OUNCES OF WATER FOR OSTEOPOROSIS. Patient taking differently: Take 70 mg by mouth once a week.  06/05/15  Yes Fayrene Helper, MD  ALPRAZolam Duanne Moron) 0.5 MG tablet Take 1 tablet (0.5 mg total) by mouth 2 (two) times daily. 06/29/18 12/29/18 Yes Fayrene Helper, MD  amLODipine (NORVASC) 10 MG tablet TAKE ONE TABLET BY MOUTH ONCE DAILY. 07/27/18  Yes Fayrene Helper, MD  aspirin EC 81 MG tablet Take 81 mg by mouth daily.   Yes [provider]  gabapentin (NEURONTIN) 100 MG capsule TAKE (2) CAPSULES BY MOUTH AT BEDTIME. 09/16/18  Yes Fayrene Helper, MD  montelukast (SINGULAIR) 10 MG tablet Take 1 tablet (10 mg total) by mouth at bedtime. 06/29/18  Yes Fayrene Helper, MD  Multiple Vitamin (MULTIVITAMIN WITH MINERALS) TABS Take 1 tablet by mouth every evening.    Yes [provider]  rosuvastatin (CRESTOR) 40 MG tablet Take 1 tablet (40 mg total) by mouth daily. 06/29/18  Yes Fayrene Helper, MD  saccharomyces boulardii (FLORASTOR) 250 MG capsule Take 1 capsule (250 mg total) by mouth 2 (two) times daily. 11/02/17  Yes Barton Dubois, MD  ciprofloxacin (CIPRO) 500 MG tablet Take 1 tablet (500 mg total) by mouth every 12 (twelve) hours. 09/23/18   Evalee Jefferson, PA-C  HYDROcodone-acetaminophen (NORCO) 7.5-325 MG tablet Take one tablet two times daily, by mouth for pain Patient not taking: Reported on 09/22/2018 08/29/18 09/28/18  Fayrene Helper, MD  metroNIDAZOLE (FLAGYL) 500 MG tablet Take 1 tablet (500 mg total) by mouth 3 (three) times daily for 7 days. 09/23/18 09/30/18  Evalee Jefferson, PA-C  potassium chloride SA  (K-DUR) 20 MEQ tablet Take 1 tablet (20 mEq total) by mouth 2 (two) times daily. 09/22/18   Evalee Jefferson, PA-C    Family History Family History  Problem Relation Age of Onset   Heart attack Father    COPD Sister    Heart disease Sister    Diabetes Sister    Coronary artery disease Sister    Liver cancer Sister 1   Diabetes Sister    Diabetes Sister    COPD Sister    Colon cancer Neg Hx     Social History Social History   Tobacco Use   Smoking status: Former Smoker    Packs/day: 3.00    Years: 50.00    Pack years: 150.00    Types: Cigarettes    Quit date: 12/12/2010    Years since quitting: 7.7   Smokeless tobacco: Never Used  Substance Use Topics   Alcohol use: No   Drug use: No     Allergies   Aricept Reather Littler  hcl], Calcium-containing compounds, and Exelon [rivastigmine tartrate]   Review of Systems Review of Systems  Constitutional: Negative for chills and fever.  HENT: Negative for congestion and sore throat.   Eyes: Negative.   Respiratory: Negative for chest tightness and shortness of breath.   Cardiovascular: Negative for chest pain.  Gastrointestinal: Negative for abdominal pain, nausea and vomiting.  Genitourinary: Negative.  Negative for dysuria and hematuria.  Musculoskeletal: Positive for back pain and neck pain. Negative for arthralgias, joint swelling and neck stiffness.  Skin: Negative.  Negative for rash and wound.  Neurological: Negative for dizziness, weakness, light-headedness, numbness and headaches.  Psychiatric/Behavioral: Negative.      Physical Exam Updated Vital Signs BP 133/73    Pulse 69    Temp 98.1 F (36.7 C) (Oral)    Resp 17    Ht 5\' 1"  (1.549 m)    Wt 59 kg    SpO2 96%    BMI 24.56 kg/m   Physical Exam Vitals signs and nursing note reviewed.  Constitutional:      Appearance: She is well-developed.  HENT:     Head: Normocephalic and atraumatic.     Mouth/Throat:     Mouth: Mucous membranes are moist.    Eyes:     Conjunctiva/sclera: Conjunctivae normal.  Neck:     Musculoskeletal: Normal range of motion.  Cardiovascular:     Rate and Rhythm: Normal rate and regular rhythm.     Heart sounds: Normal heart sounds.  Pulmonary:     Effort: Pulmonary effort is normal. No respiratory distress.     Breath sounds: Normal breath sounds. No wheezing or rhonchi.  Chest:     Chest wall: No tenderness.  Abdominal:     General: Bowel sounds are normal.     Palpations: Abdomen is soft. There is no mass.     Tenderness: There is no abdominal tenderness. There is no right CVA tenderness or left CVA tenderness.  Musculoskeletal: Normal range of motion.     Cervical back: She exhibits no bony tenderness.     Thoracic back: Normal.     Lumbar back: She exhibits no bony tenderness, no swelling and no edema.       Back:     Comments: ttp right trapezius without spasm.  Pt displays FROM of c spine without elicitation of pain  Skin:    General: Skin is warm and dry.     Comments: No rash on trunk or back.  Neurological:     Mental Status: She is alert.      ED Treatments / Results  Labs (all labs ordered are listed, but only abnormal results are displayed) Labs Reviewed  URINALYSIS, ROUTINE W REFLEX MICROSCOPIC - Abnormal; Notable for the following components:      Result Value   APPearance CLOUDY (*)    Hgb urine dipstick MODERATE (*)    Protein, ur 30 (*)    Nitrite POSITIVE (*)    Leukocytes,Ua MODERATE (*)    WBC, UA >50 (*)    Bacteria, UA MANY (*)    All other components within normal limits  BASIC METABOLIC PANEL - Abnormal; Notable for the following components:   Potassium 2.9 (*)    Glucose, Bld 110 (*)    Creatinine, Ser 1.24 (*)    GFR calc non Af Amer 43 (*)    GFR calc Af Amer 50 (*)    All other components within normal limits  URINE CULTURE  CBC WITH DIFFERENTIAL/PLATELET  EKG EKG Interpretation  Date/Time:  Tuesday September 22 2018 10:46:19 EDT Ventricular Rate:   88 PR Interval:    QRS Duration: 88 QT Interval:  336 QTC Calculation: 407 R Axis:   92 Text Interpretation:  Sinus rhythm Anterior infarct, age indeterminate Baseline wander Artifact When compared with ECG of 09/21/2018 No significant change was found Confirmed by Francine Graven [phone]) on 09/22/2018 12:03:16 PM   Radiology Dg Chest 2 View  Result Date: 09/22/2018 CLINICAL DATA:  Right shoulder and neck pain for 1 week. EXAM: CHEST - 2 VIEW COMPARISON:  Chest x-ray 11/01/2017 FINDINGS: The cardiac silhouette, mediastinal and hilar contours are within normal limits and stable. Mild tortuosity and calcification of the thoracic aorta. The lungs are clear of an acute process. No infiltrates or effusions. Stable emphysematous changes. No worrisome pulmonary lesions. The bony thorax is intact. IMPRESSION: Mild stable emphysematous changes but no acute overlying pulmonary findings. Electronically Signed   By: Marijo Sanes M.D.   On: 09/22/2018 14:20   Dg Cervical Spine Complete  Result Date: 09/22/2018 CLINICAL DATA:  Right-sided neck pain for 1 week, no known injury, initial encounter EXAM: CERVICAL SPINE - COMPLETE 4+ VIEW COMPARISON:  02/20/2018 FINDINGS: Seven cervical segments are well visualized. Vertebral body height is well maintained. Mild osteophytic changes are noted from C4-C7 stable from the previous exam. Neural foraminal narrowing is noted bilaterally at C5-6 slightly worse on the right than the left. Carotid calcifications are seen. Facet hypertrophic changes are noted. IMPRESSION: Degenerative change with bilateral neural foraminal narrowing at C5-6. Electronically Signed   By: Inez Catalina M.D.   On: 09/22/2018 14:21   Dg Lumbar Spine Complete  Result Date: 09/22/2018 CLINICAL DATA:  Low back pain for 1 week, no known injury, initial encounter EXAM: LUMBAR SPINE - COMPLETE 4+ VIEW COMPARISON:  None. FINDINGS: Five lumbar type vertebral bodies are well visualized. Vertebral body  height is well maintained. No anterolisthesis is noted. Mild osteophytic changes are seen. Aortic calcifications are noted. No soft tissue abnormality is seen. IMPRESSION: Mild degenerative change without acute abnormality. Electronically Signed   By: Inez Catalina M.D.   On: 09/22/2018 14:23   Ct Renal Stone Study  Result Date: 09/22/2018 CLINICAL DATA:  Flank pain, hematuria EXAM: CT ABDOMEN AND PELVIS WITHOUT CONTRAST TECHNIQUE: Multidetector CT imaging of the abdomen and pelvis was performed following the standard protocol without IV contrast. COMPARISON:  11/01/2017, 07/07/2013 FINDINGS: Lower chest: No acute abnormality.  Coronary artery calcifications. Hepatobiliary: No focal liver abnormality is seen. Status post cholecystectomy. Postoperative biliary ductal dilatation. Pancreas: Unremarkable. No pancreatic ductal dilatation or surrounding inflammatory changes. Spleen: Normal in size without significant abnormality. Adrenals/Urinary Tract: Adrenal glands are unremarkable. Redemonstrated severe right hydronephrosis and proximal hydroureter, with focal narrowing and return to normal caliber at the level of the iliac vessels. Bladder is unremarkable. Stomach/Bowel: Stomach is within normal limits. Large burden of stool in the colon. Sigmoid diverticulosis. There is mild fat stranding about the distal sigmoid (series 2, image 61, series 5, image 83). Vascular/Lymphatic: Aortic atherosclerosis with aortobifemoral bypass graft repair. No enlarged abdominal or pelvic lymph nodes. Reproductive: Status post hysterectomy. Other: No abdominal wall hernia or abnormality. No abdominopelvic ascites. Musculoskeletal: No acute or significant osseous findings. IMPRESSION: 1. Redemonstrated severe right hydronephrosis and proximal hydroureter, with focal narrowing and return to normal caliber at the level of the iliac vessels. This may reflect chronic stricture. There is no evidence of urinary tract calculus. Ureteral  stricture may be further evaluated by  nuclear scintigraphic Lasix renogram if desired. 2. Large burden of stool in the colon. Sigmoid diverticulosis. There is mild fat stranding about the distal sigmoid (series 2, image 61, series 5, image 83), suggestive of acute diverticulitis. No evidence of perforation or abscess. 3. Other chronic, incidental, and postoperative findings, including aortobifemoral bypass graft, as detailed above. Electronically Signed   By: Eddie Candle M.D.   On: 09/22/2018 16:42    Procedures Procedures (including critical care time)  Medications Ordered in ED Medications  potassium chloride SA (K-DUR) CR tablet 40 mEq (40 mEq Oral Given 09/22/18 1459)  nitrofurantoin (macrocrystal-monohydrate) (MACROBID) capsule 100 mg (100 mg Oral Given 09/22/18 1459)     Initial Impression / Assessment and Plan / ED Course  I have reviewed the triage vital signs and the nursing notes.  Pertinent labs & imaging results that were available during my care of the patient were reviewed by me and considered in my medical decision making (see chart for details).        Pt with uti with no evidence of right sided pyelonephritis.  Review of chart indicating right hydronephrosis is a chronic finding.  Creatinine elevated but stable.  Sx may be partially from her chronic neck and back problems, she is neurologically intact on exam, no concerns for emergent c spine or l spine impingement or cauda equina.  CT abd/pelvis  She was treated for uti, urine cx sent.  Potassium is low which may be the source of weakness she reported ytd at pcp visit.  This was supplemented. She was advised close f/u with pcp, if sx improved, repeat UA after abx completed.   Upon closer review of CT findings, suggestion of distal diverticulitis without abscess.  Additional abx will be called to pharmacy. Advised RN to notify patient of this abx change, including stopping the macrobid, instead starting cipro/flagyl.  Final  Clinical Impressions(s) / ED Diagnoses   Final diagnoses:  Acute right-sided low back pain without sciatica  Acute cystitis with hematuria  Neck pain  Hypokalemia  Diverticulitis    ED Discharge Orders         Ordered    nitrofurantoin, macrocrystal-monohydrate, (MACROBID) 100 MG capsule  2 times daily,   Status:  Discontinued     09/22/18 1711    potassium chloride SA (K-DUR) 20 MEQ tablet  2 times daily     09/22/18 1711           Evalee Jefferson, PA-C 09/23/18 [place], [place], DO 09/26/18 [phone]

## 2018-09-25 LAB — URINE CULTURE
Culture: >=100000 — AB
Special Requests: NORMAL

## 2018-09-26 NOTE — Telephone Encounter (Signed)
Post ED Visit - Positive Culture Follow-up  Culture report reviewed by antimicrobial stewardship pharmacist: [facility] Team []  Elenor Quinones, Pharm.D. []  Heide Guile, Pharm.D., BCPS AQ-ID []  Parks Neptune, Pharm.D., BCPS []  Alycia Rossetti, Pharm.D., BCPS []  [place], Pharm.D., BCPS, AAHIVP []  Legrand Como, Pharm.D., BCPS, AAHIVP [x]  Salome Arnt, PharmD, BCPS []  Johnnette Gourd, PharmD, BCPS []  Hughes Better, PharmD, BCPS []  Leeroy Cha, PharmD []  Laqueta Linden, PharmD, BCPS []  Albertina Parr, PharmD  [place] Team []  Leodis Sias, PharmD []  Lindell Spar, PharmD []  Royetta Asal, PharmD []  Graylin Shiver, Rph []  Rema Fendt) Glennon Mac, PharmD []  Arlyn Dunning, PharmD []  Netta Cedars, PharmD []  Dia Sitter, PharmD []  Leone Haven, PharmD []  Gretta Arab, PharmD []  Theodis Shove, PharmD []  Peggyann Juba, PharmD []  Reuel Boom, PharmD   Positive urine culture Treated with Nitrofurantoin, organism sensitive to the same and no further patient follow-up is required at this time.  Sandi Raveling Yolanda Huffstetler 09/26/2018, 12:43 PM

## 2018-09-28 ENCOUNTER — Other Ambulatory Visit: Payer: Self-pay | Admitting: Family Medicine

## 2018-09-28 NOTE — Telephone Encounter (Signed)
Routing to Dr Moshe Cipro

## 2018-09-29 ENCOUNTER — Other Ambulatory Visit: Payer: Self-pay | Admitting: Family Medicine

## 2018-09-29 ENCOUNTER — Telehealth: Payer: Self-pay | Admitting: Family Medicine

## 2018-09-29 MED ORDER — HYDROCODONE-ACETAMINOPHEN 7.5-325 MG PO TABS: ORAL_TABLET | ORAL | 0 refills | Status: DC

## 2018-09-29 NOTE — Telephone Encounter (Signed)
Pt stopped by to get pain management prescription, I advised that I would send a note to Dr Moshe Cipro, and we would call her back

## 2018-09-29 NOTE — Telephone Encounter (Signed)
pls let her know medication is sent for 1 month, needs appt in 3.5 weeks also, thanks

## 2018-09-30 ENCOUNTER — Other Ambulatory Visit: Payer: Self-pay

## 2018-09-30 NOTE — Telephone Encounter (Signed)
Left message requesting call back. Made appt for 11/22/18 at 10:20 for 3.5 wk follow up per Dr.Simpson

## 2018-10-05 ENCOUNTER — Other Ambulatory Visit: Payer: Self-pay | Admitting: Family Medicine

## 2018-10-19 ENCOUNTER — Other Ambulatory Visit: Payer: Self-pay | Admitting: Family Medicine

## 2018-10-22 ENCOUNTER — Ambulatory Visit: Payer: PPO | Admitting: Family Medicine

## 2018-10-22 ENCOUNTER — Encounter: Payer: Self-pay | Admitting: Family Medicine

## 2018-10-24 ENCOUNTER — Other Ambulatory Visit: Payer: Self-pay | Admitting: Family Medicine

## 2018-10-26 ENCOUNTER — Other Ambulatory Visit: Payer: Self-pay | Admitting: Family Medicine

## 2018-10-27 ENCOUNTER — Encounter: Payer: Self-pay | Admitting: Family Medicine

## 2018-10-27 ENCOUNTER — Ambulatory Visit (INDEPENDENT_AMBULATORY_CARE_PROVIDER_SITE_OTHER): Payer: PPO | Admitting: Family Medicine

## 2018-10-27 ENCOUNTER — Other Ambulatory Visit: Payer: Self-pay

## 2018-10-27 ENCOUNTER — Encounter (INDEPENDENT_AMBULATORY_CARE_PROVIDER_SITE_OTHER): Payer: Self-pay

## 2018-10-27 VITALS — BP 110/56 | HR 86 | Resp 12 | Ht 63.0 in | Wt 111.1 lb

## 2018-10-27 DIAGNOSIS — G8929 Other chronic pain: Secondary | ICD-10-CM

## 2018-10-27 DIAGNOSIS — E785 Hyperlipidemia, unspecified: Secondary | ICD-10-CM | POA: Diagnosis not present

## 2018-10-27 DIAGNOSIS — I10 Essential (primary) hypertension: Secondary | ICD-10-CM

## 2018-10-27 DIAGNOSIS — F411 Generalized anxiety disorder: Secondary | ICD-10-CM | POA: Diagnosis not present

## 2018-10-27 DIAGNOSIS — E1151 Type 2 diabetes mellitus with diabetic peripheral angiopathy without gangrene: Secondary | ICD-10-CM | POA: Diagnosis not present

## 2018-10-27 MED ORDER — HYDROCODONE-ACETAMINOPHEN 7.5-325 MG PO TABS: ORAL_TABLET | ORAL | 0 refills | Status: DC

## 2018-10-27 MED ORDER — ALPRAZOLAM 0.5 MG PO TABS: ORAL_TABLET | ORAL | 5 refills | Status: DC

## 2018-10-27 NOTE — Patient Instructions (Signed)
F/U in 12 weeks with MD, pain management and MMSE at visit  Fasting lipid,cmp and EGFr, hBA1C today or this week please ([facility])]  Pain and anxiety medications are sent   No medication change , especially since you are no longer a young lady:-)  Keep a positive , smiling attitude, and try NOT to worry

## 2018-11-01 ENCOUNTER — Encounter: Payer: Self-pay | Admitting: Family Medicine

## 2018-11-01 MED ORDER — HYDROCODONE-ACETAMINOPHEN 7.5-325 MG PO TABS: ORAL_TABLET | ORAL | 0 refills | Status: AC

## 2018-11-01 NOTE — Assessment & Plan Note (Signed)
Hyperlipidemia:Low fat diet discussed and encouraged.   Lipid Panel  Lab Results  Component Value Date   CHOL 183 03/04/2018   HDL 70 03/04/2018   LDLCALC 79 03/04/2018   LDLDIRECT 166 (H) 10/11/2009   TRIG 246 (H) 03/04/2018   CHOLHDL 2.6 03/04/2018     Uncontrolled,  Updated lab needed at/ before next visit.

## 2018-11-01 NOTE — Progress Notes (Signed)
ALEXAH KIVETT     MRN: [id number]      DOB: [birth year]   HPI Ms. Mazzoni is here for follow up and re-evaluation of chronic medical conditions, medication management and review of any available recent lab and radiology data.  Preventive health is updated, specifically  Cancer screening and Immunization.   Questions or concerns regarding consultations or procedures which the PT has had in the interim are  addressed. The PT denies any adverse reactions to current medications since the last visit.  There are no new concerns.  There are no specific complaints   ROS Denies recent fever or chills. Denies sinus pressure, nasal congestion, ear pain or sore throat. Denies chest congestion, productive cough or wheezing. Denies chest pains, palpitations and leg swelling Denies abdominal pain, nausea, vomiting,diarrhea or constipation.   Denies dysuria, frequency, hesitancy or incontinence. Denies joint pain, swelling and limitation in mobility. Denies headaches, seizures, numbness, or tingling. Denies uncontrolled depression, anxiety or insomnia. Denies skin break down or rash.   PE  BP (!) 110/56   Pulse 86   Resp 12   Ht 5\' 3"  (1.6 m)   Wt 111 lb 1.3 oz (50.4 kg)   SpO2 95%   BMI 19.68 kg/m   Patient alert and oriented and in no cardiopulmonary distress.  HEENT: No facial asymmetry, EOMI,   oropharynx pink and moist.  Neck supple no JVD, no mass.  Chest: Clear to auscultation bilaterally.  CVS: S1, S2 no murmurs, no S3.Regular rate.  ABD: Soft non tender.   Ext: No edema  MS: Adequate ROM spine, shoulders, hips and knees.  Skin: Intact, no ulcerations or rash noted.  Psych: Good eye contact, normal affect. Memory intact not anxious or depressed appearing.  CNS: CN 2-12 intact, power,  normal throughout.no focal deficits noted.   Assessment & Plan  Essential hypertension Controlled, no change in medication DASH diet and commitment to daily physical activity for  a minimum of 30 minutes discussed and encouraged, as a part of hypertension management. The importance of attaining a healthy weight is also discussed.  BP/Weight 10/27/2018 09/22/2018 09/21/2018 09/21/2018 06/29/2018 03/04/2018 52/10/4130  Systolic BP 440 102 725 366 440 347 425  Diastolic BP 56 73 70 42 70 70 70  Wt. (Lbs) 111.08 130 125 129 132 132 131.12  BMI 19.68 24.56 23.62 23.59 24.14 24.14 23.98       Encounter for chronic pain management The patient's Controlled Substance registry is reviewed and compliance confirmed. Adequacy of  Pain control and level of function is assessed. Medication dosing is adjusted as deemed appropriate. Twelve weeks of medication is prescribed , patient signs for the script and is provided with a follow up appointment between 11 to 12 weeks .   GAD (generalized anxiety disorder) Controlled, no change in medication   Hyperlipidemia LDL goal <100 Hyperlipidemia:Low fat diet discussed and encouraged.   Lipid Panel  Lab Results  Component Value Date   CHOL 183 03/04/2018   HDL 70 03/04/2018   LDLCALC 79 03/04/2018   LDLDIRECT 166 (H) 10/11/2009   TRIG 246 (H) 03/04/2018   CHOLHDL 2.6 03/04/2018     Uncontrolled,  Updated lab needed at/ before next visit.   Well controlled type 2 diabetes mellitus with peripheral circulatory disorder [facility]) Ms. Flinders is reminded of the importance of commitment to daily physical activity for 30 minutes or more, as able and the need to limit carbohydrate intake to 30 to 60 grams per meal to help  with blood sugar control.   The need to take medication as prescribed, test blood sugar as directed, and to call between visits if there is a concern that blood sugar is uncontrolled is also discussed.   Ms. Lasseigne is reminded of the importance of daily foot exam, annual eye examination, and good blood sugar, blood pressure and cholesterol control. Updated lab needed at/ before next visit.  Diabetic Labs Latest Ref  Rng & Units 09/22/2018 09/21/2018 03/04/2018 11/05/2017 11/02/2017  HbA1c <5.7 % of total Hgb - - 6.4(H) - -  Microalbumin Not Estab. ug/mL - - 46.0(H) - -  Micro/Creat Ratio 0.0 - 30.0 mg/g creat - - - - -  Chol <200 mg/dL - - 183 - -  HDL >50 mg/dL - - 70 - -  Calc LDL mg/dL (calc) - - 79 - -  Triglycerides <150 mg/dL - - 246(H) - -  Creatinine 0.44 - 1.00 mg/dL 1.24(H) 1.34(H) 1.02(H) 1.08(H) 0.83   BP/Weight 10/27/2018 09/22/2018 09/21/2018 09/21/2018 06/29/2018 03/04/2018 09/28/8086  Systolic BP 110 315 945 859 292 446 286  Diastolic BP 56 73 70 42 70 70 70  Wt. (Lbs) 111.08 130 125 129 132 132 131.12  BMI 19.68 24.56 23.62 23.59 24.14 24.14 23.98   Foot/eye exam completion dates Latest Ref Rng & Units 09/10/2017 07/23/2016  Eye Exam No Retinopathy - -  Foot Form Completion - Done Done

## 2018-11-01 NOTE — Assessment & Plan Note (Signed)
Carla [name] is reminded of the importance of commitment to daily physical activity for 30 minutes or more, as able and the need to limit carbohydrate intake to 30 to 60 grams per meal to help with blood sugar control.   The need to take medication as prescribed, test blood sugar as directed, and to call between visits if there is a concern that blood sugar is uncontrolled is also discussed.   Carla [name] is reminded of the importance of daily foot exam, annual eye examination, and good blood sugar, blood pressure and cholesterol control. Updated lab needed at/ before next visit.  Diabetic Labs Latest Ref Rng & Units 09/22/2018 09/21/2018 03/04/2018 11/05/2017 11/02/2017  HbA1c <5.7 % of total Hgb - - 6.4(H) - -  Microalbumin Not Estab. ug/mL - - 46.0(H) - -  Micro/Creat Ratio 0.0 - 30.0 mg/g creat - - - - -  Chol <200 mg/dL - - 183 - -  HDL >50 mg/dL - - 70 - -  Calc LDL mg/dL (calc) - - 79 - -  Triglycerides <150 mg/dL - - 246(H) - -  Creatinine 0.44 - 1.00 mg/dL 1.24(H) 1.34(H) 1.02(H) 1.08(H) 0.83   BP/Weight 10/27/2018 09/22/2018 09/21/2018 09/21/2018 06/29/2018 03/04/2018 84/04/1029  Systolic BP 281 188 677 373 668 159 470  Diastolic BP 56 73 70 42 70 70 70  Wt. (Lbs) 111.08 130 125 129 132 132 131.12  BMI 19.68 24.56 23.62 23.59 24.14 24.14 23.98   Foot/eye exam completion dates Latest Ref Rng & Units 09/10/2017 07/23/2016  Eye Exam No Retinopathy - -  Foot Form Completion - Done Done

## 2018-11-01 NOTE — Assessment & Plan Note (Signed)
Controlled, no change in medication  

## 2018-11-01 NOTE — Assessment & Plan Note (Signed)
The patient's Controlled Substance registry is reviewed and compliance confirmed. Adequacy of  Pain control and level of function is assessed. Medication dosing is adjusted as deemed appropriate. Twelve weeks of medication is prescribed , patient signs for the script and is provided with a follow up appointment between 11 to 12 weeks .  

## 2018-11-01 NOTE — Assessment & Plan Note (Signed)
Controlled, no change in medication DASH diet and commitment to daily physical activity for a minimum of 30 minutes discussed and encouraged, as a part of hypertension management. The importance of attaining a healthy weight is also discussed.  BP/Weight 10/27/2018 09/22/2018 09/21/2018 09/21/2018 06/29/2018 03/04/2018 35/06/6566  Systolic BP 127 517 001 749 449 675 916  Diastolic BP 56 73 70 42 70 70 70  Wt. (Lbs) 111.08 130 125 129 132 132 131.12  BMI 19.68 24.56 23.62 23.59 24.14 24.14 23.98

## 2018-11-04 ENCOUNTER — Encounter: Payer: PPO | Admitting: Family Medicine

## 2018-11-05 ENCOUNTER — Ambulatory Visit: Payer: PPO

## 2018-11-09 ENCOUNTER — Ambulatory Visit: Payer: PPO

## 2018-11-11 ENCOUNTER — Encounter: Payer: Self-pay | Admitting: Family Medicine

## 2018-11-11 ENCOUNTER — Ambulatory Visit (INDEPENDENT_AMBULATORY_CARE_PROVIDER_SITE_OTHER): Payer: PPO | Admitting: Family Medicine

## 2018-11-11 ENCOUNTER — Other Ambulatory Visit: Payer: Self-pay

## 2018-11-11 VITALS — BP 110/56 | HR 86 | Resp 12 | Ht 63.0 in | Wt 111.0 lb

## 2018-11-11 DIAGNOSIS — Z0000 Encounter for general adult medical examination without abnormal findings: Secondary | ICD-10-CM | POA: Diagnosis not present

## 2018-11-11 NOTE — Progress Notes (Signed)
Subjective:   Carla [name] is a 73 y.o. female who presents for Medicare Annual (Subsequent) preventive examination.  Location of Patient: Home Location of Provider: Telehealth Consent was obtain for visit to be over via telehealth. I verified that I am speaking with the correct person using two identifiers.  Review of Systems:    Cardiac Risk Factors include: advanced age (>29men, >74 women);diabetes mellitus;dyslipidemia;hypertension     Objective:     Vitals: BP (!) 110/56   Pulse 86   Resp 12   Ht 5\' 3"  (1.6 m)   Wt 111 lb (50.3 kg)   BMI 19.66 kg/m   Body mass index is 19.66 kg/m.  Advanced Directives 09/22/2018 09/21/2018 02/20/2018 11/03/2017 11/01/2017 11/01/2017 12/11/2016  Does Patient Have a Medical Advance Directive? No No No No No No No  Would patient like information on creating a medical advance directive? No - Patient declined - - Yes (ED - Information included in AVS) No - Patient declined No - Patient declined Yes (MAU/Ambulatory/Procedural Areas - Information given)  Pre-existing out of facility DNR order (yellow form or pink MOST form) - - - - - - -    Tobacco Social History   Tobacco Use  Smoking Status Former Smoker  . Packs/day: 3.00  . Years: 50.00  . Pack years: 150.00  . Types: Cigarettes  . Quit date: 12/12/2010  . Years since quitting: 7.9  Smokeless Tobacco Never Used     Counseling given: Yes   Clinical Intake:  Pre-visit preparation completed: Yes  Pain : 0-10 Pain Score: 4  Pain Type: Chronic pain Pain Location: Back Pain Orientation: Lower Pain Descriptors / Indicators: Sharp Pain Onset: In the past 7 days Pain Frequency: Constant     BMI - recorded: 19.66 Nutritional Status: BMI of 19-24  Normal Nutritional Risks: None Diabetes: Yes CBG done?: No Did pt. bring in CBG monitor from home?: No  How often do you need to have someone help you when you read instructions, pamphlets, or other written materials from your  doctor or pharmacy?: 1 - Never What is the last grade level you completed in school?: 9  Interpreter Needed?: No     Past Medical History:  Diagnosis Date  . ASCVD (arteriosclerotic cardiovascular disease)    No critical disease on cath in 8/97: mild AI with trival, if any l AS; negative stress nuclear in 2010  . Back pain   . Cervical disc herniation   . Chronic bronchitis   . Colon polyps   . Depression   . Elevated hemoglobin A1c   . Fasting hyperglycemia   . GERD (gastroesophageal reflux disease)   . HSV (herpes simplex virus) infection   . Hydronephrosis   . Hypertension   . Osteoporosis   . Peripheral vascular disease ([place])    stauts post aortabifemoral graft    . Secondary erythrocytosis 08/05/2012   Secondary to COPD  . Tobacco abuse    has stopped   Past Surgical History:  Procedure Laterality Date  . ABDOMINAL HYSTERECTOMY    . aorta bifem graft    . CHOLECYSTECTOMY    . COLONOSCOPY  06/23/2006   Dr. Fields:Normal colon without evidence of polyps, masses, inflammatory changes/Normal retroflex view of the rectum  . COLONOSCOPY N/A 08/24/2013   Procedure: COLONOSCOPY;  Surgeon: Danie Binder, MD;  Location: [facility];  Service: Endoscopy;  Laterality: N/A;  10:30-moved to [place] notified pt  . lysis of adhesions  2000  .  PARTIAL HYSTERECTOMY    . right kidney surgery following damage during arterial surgery    . TOTAL ABDOMINAL HYSTERECTOMY W/ BILATERAL SALPINGOOPHORECTOMY  1975   Family History  Problem Relation Age of Onset  . Heart attack Father   . COPD Sister   . Heart disease Sister   . Diabetes Sister   . Coronary artery disease Sister   . Liver cancer Sister 71  . Diabetes Sister   . Diabetes Sister   . COPD Sister   . Colon cancer Neg Hx    Social History   Socioeconomic History  . Marital status: Married    Spouse name: Not on file  . Number of children: 3  . Years of education: Not on file  . Highest education level: Not on  file  Occupational History  . Occupation: disabled   Social Needs  . Financial resource strain: Not hard at all  . Food insecurity    Worry: Never true    Inability: Never true  . Transportation needs    Medical: No    Non-medical: No  Tobacco Use  . Smoking status: Former Smoker    Packs/day: 3.00    Years: 50.00    Pack years: 150.00    Types: Cigarettes    Quit date: 12/12/2010    Years since quitting: 7.9  . Smokeless tobacco: Never Used  Substance and Sexual Activity  . Alcohol use: No  . Drug use: No  . Sexual activity: Yes    Birth control/protection: Surgical  Lifestyle  . Physical activity    Days per week: 0 days    Minutes per session: 0 min  . Stress: Only a little  Relationships  . Social Herbalist on phone: Twice a week    Gets together: Twice a week    Attends religious service: 1 to 4 times per year    Active member of club or organization: No    Attends meetings of clubs or organizations: Never    Relationship status: Married  Other Topics Concern  . Not on file  Social History Narrative   2 Children living 1 deceased     Outpatient Encounter Medications as of 11/11/2018  Medication Sig  . alendronate (FOSAMAX) 70 MG tablet TAKE 1 TABLET EACH WEEK 30 MIN BEFORE BREAKFAST WITH 8 OUNCES OF WATER FOR OSTEOPOROSIS. (Patient taking differently: Take 70 mg by mouth once a week. )  . ALPRAZolam (XANAX) 0.5 MG tablet Take one tablet two times daily by mouth for anxiety  . amLODipine (NORVASC) 10 MG tablet TAKE ONE TABLET BY MOUTH ONCE DAILY.  Marland Kitchen aspirin EC 81 MG tablet Take 81 mg by mouth daily.  Marland Kitchen gabapentin (NEURONTIN) 100 MG capsule TAKE (2) CAPSULES BY MOUTH AT BEDTIME.  Marland Kitchen HYDROcodone-acetaminophen (NORCO) 7.5-325 MG tablet Take one tablet two times daily by mouth, for pain  . [START ON 11/26/2018] HYDROcodone-acetaminophen (NORCO) 7.5-325 MG tablet Take 1 tablet by mouth 2 times daily for chronic back pain  . [START ON 12/26/2018]  HYDROcodone-acetaminophen (NORCO) 7.5-325 MG tablet Take 1 tablet by mouth 2 times daily for chronic back pain  . montelukast (SINGULAIR) 10 MG tablet TAKE (1) TABLET BY MOUTH AT BEDTIME.  . Multiple Vitamin (MULTIVITAMIN WITH MINERALS) TABS Take 1 tablet by mouth every evening.   . potassium chloride SA (K-DUR) 20 MEQ tablet Take 1 tablet (20 mEq total) by mouth 2 (two) times daily.  . rosuvastatin (CRESTOR) 40 MG tablet Take 1 tablet (  40 mg total) by mouth daily.  Marland Kitchen saccharomyces boulardii (FLORASTOR) 250 MG capsule Take 1 capsule (250 mg total) by mouth 2 (two) times daily.   No facility-administered encounter medications on file as of 11/11/2018.     Activities of Daily Living In your present state of health, do you have any difficulty performing the following activities: 11/11/2018  Hearing? N  Difficulty concentrating or making decisions? Y  Walking or climbing stairs? N  Dressing or bathing? N  Doing errands, shopping? N  Preparing Food and eating ? N  Using the Toilet? N  In the past six months, have you accidently leaked urine? N  Do you have problems with loss of bowel control? N  Managing your Medications? N  Managing your Finances? N  Housekeeping or managing your Housekeeping? N  Some recent data might be hidden    Patient Care Team: Fayrene Helper, MD as PCP - [facility], Marga Melnick, MD as Consulting Physician (Gastroenterology)    Assessment:   This is a routine wellness examination for Carla [name].  Exercise Activities and Dietary recommendations Current Exercise Habits: The patient does not participate in regular exercise at present, Exercise limited by: orthopedic condition(s)  Goals    . Exercise 3x per week (30 min per time)     Recommend starting a routine exercise program at least 3 days a week for 30-45 minutes at a time as tolerated.         Fall Risk Fall Risk  11/11/2018 10/27/2018 09/21/2018 03/04/2018 02/26/2018  Falls in the past year? 0 0 0 0 0   Number falls in past yr: 0 - - - -  Injury with Fall? 0 0 0 - -   Is the patient's home free of loose throw rugs in walkways, pet beds, electrical cords, etc?   yes      Grab bars in the bathroom? yes      Handrails on the stairs?   yes      Adequate lighting?   yes    Depression Screen PHQ 2/9 Scores 11/11/2018 10/27/2018 09/21/2018 03/04/2018  PHQ - 2 Score 1 2 0 0  PHQ- 9 Score - 8 - -     Cognitive Function MMSE - Mini Mental State Exam 01/23/2017 10/30/2015 07/06/2015  Orientation to time 2 4 5   Orientation to Place 5 5 5   Registration 3 3 3   Attention/ Calculation 5 5 5   Recall 0 0 0  Language- name 2 objects 2 2 2   Language- repeat 1 1 1   Language- follow 3 step command 3 3 3   Language- read & follow direction 1 1 1   Write a sentence 1 1 1   Copy design 0 1 0  Total score 23 26 26      6CIT Screen 11/11/2018 11/03/2017 11/03/2017 12/11/2016  What Year? 4 points 4 points 4 points 0 points  What month? 3 points 3 points 3 points 0 points  What time? 0 points 0 points 0 points 0 points  Count back from 20 0 points 2 points - 0 points  Months in reverse 0 points 4 points 4 points 0 points  Repeat phrase 0 points 10 points - 0 points  Total Score 7 23 - 0    Immunization History  Administered Date(s) Administered  . Influenza Split 03/11/2011, 01/23/2012  . Influenza Whole 01/23/2007, 12/28/2008, 01/17/2010  . Influenza, High Dose Seasonal PF 12/30/2017  . Influenza,inj,Quad PF,6+ Mos 01/05/2013, 02/10/2014, 12/15/2014, 12/11/2016  . Pneumococcal Conjugate-13  04/20/2014  . Pneumococcal Polysaccharide-23 11/19/2010  . Td 04/05/2003  . Tdap 12/22/2013  . Zoster 11/19/2010    Qualifies for Shingles Vaccine? completed  Screening Tests Health Maintenance  Topic Date Due  . OPHTHALMOLOGY EXAM  09/09/2017  . HEMOGLOBIN A1C  09/03/2018  . FOOT EXAM  09/13/2018  . INFLUENZA VACCINE  10/24/2018  . URINE MICROALBUMIN  03/05/2019  . MAMMOGRAM  11/13/2019  . COLONOSCOPY   08/25/2023  . TETANUS/TDAP  12/23/2023  . DEXA SCAN  Completed  . Hepatitis C Screening  Completed  . PNA vac Low Risk Adult  Completed    Cancer Screenings: Lung: Low Dose CT Chest recommended if Age 63-80 years, 30 pack-year currently smoking OR have quit w/in 15years. Patient does qualify. Breast:  Up to date on Mammogram? Yes   Up to date of Bone Density/Dexa? Yes Colorectal:  Due Fall 2025  Additional Screenings:  Hepatitis C Screening: completed     Plan:   1. Encounter for Medicare annual wellness exam  I have personally reviewed and noted the following in the patient's chart:   . Medical and social history . Use of alcohol, tobacco or illicit drugs  . Current medications and supplements . Functional ability and status . Nutritional status . Physical activity . Advanced directives . List of other physicians . Hospitalizations, surgeries, and ER visits in previous 12 months . Vitals . Screenings to include cognitive, depression, and falls . Referrals and appointments  In addition, I have reviewed and discussed with patient certain preventive protocols, quality metrics, and best practice recommendations. A written personalized care plan for preventive services as well as general preventive health recommendations were provided to patient.     I provided 20 minutes of non-face-to-face time during this encounter.   Perlie Mayo, NP  11/11/2018

## 2018-11-11 NOTE — Patient Instructions (Signed)
Carla [name] , Thank you for taking time to come for your Medicare Wellness Visit. I appreciate your ongoing commitment to your health goals. Please review the following plan we discussed and let me know if I can assist you in the future.   Please continue to practice social distancing to keep you, your family, and our community safe.  If you must go out, please wear a Mask and practice good handwashing.  Screening recommendations/referrals: Colonoscopy: Due 2025 Mammogram: Due 2021 Bone Density: Completed Recommended yearly ophthalmology/optometry visit for glaucoma screening and checkup Recommended yearly dental visit for hygiene and checkup  Vaccinations: Influenza vaccine: Due Fall 2020 Pneumococcal vaccine: Completed Tdap vaccine: Due 2025 Shingles vaccine: Completed  Advanced directives: decline paperwork  Conditions/risks identified: FALLS  Next appointment: 01/19/2019    Preventive Care 3 Years and Older, Female Preventive care refers to lifestyle choices and visits with your health care provider that can promote health and wellness. What does preventive care include?  A yearly physical exam. This is also called an annual well check.  Dental exams once or twice a year.  Routine eye exams. Ask your health care provider how often you should have your eyes checked.  Personal lifestyle choices, including:  Daily care of your teeth and gums.  Regular physical activity.  Eating a healthy diet.  Avoiding tobacco and drug use.  Limiting alcohol use.  Practicing safe sex.  Taking low-dose aspirin every day.  Taking vitamin and mineral supplements as recommended by your health care provider. What happens during an annual well check? The services and screenings done by your health care provider during your annual well check will depend on your age, overall health, lifestyle risk factors, and family history of disease. Counseling  Your health care provider may ask  you questions about your:  Alcohol use.  Tobacco use.  Drug use.  Emotional well-being.  Home and relationship well-being.  Sexual activity.  Eating habits.  History of falls.  Memory and ability to understand (cognition).  Work and work Statistician.  Reproductive health. Screening  You may have the following tests or measurements:  Height, weight, and BMI.  Blood pressure.  Lipid and cholesterol levels. These may be checked every 5 years, or more frequently if you are over 57 years old.  Skin check.  Lung cancer screening. You may have this screening every year starting at age 36 if you have a 30-pack-year history of smoking and currently smoke or have quit within the past 15 years.  Fecal occult blood test (FOBT) of the stool. You may have this test every year starting at age 31.  Flexible sigmoidoscopy or colonoscopy. You may have a sigmoidoscopy every 5 years or a colonoscopy every 10 years starting at age 42.  Hepatitis C blood test.  Hepatitis B blood test.  Sexually transmitted disease (STD) testing.  Diabetes screening. This is done by checking your blood sugar (glucose) after you have not eaten for a while (fasting). You may have this done every 1-3 years.  Bone density scan. This is done to screen for osteoporosis. You may have this done starting at age 62.  Mammogram. This may be done every 1-2 years. Talk to your health care provider about how often you should have regular mammograms. Talk with your health care provider about your test results, treatment options, and if necessary, the need for more tests. Vaccines  Your health care provider may recommend certain vaccines, such as:  Influenza vaccine. This is recommended every year.  Tetanus, diphtheria, and acellular pertussis (Tdap, Td) vaccine. You may need a Td booster every 10 years.  Zoster vaccine. You may need this after age 53.  Pneumococcal 13-valent conjugate (PCV13) vaccine. One dose  is recommended after age 28.  Pneumococcal polysaccharide (PPSV23) vaccine. One dose is recommended after age 72. Talk to your health care provider about which screenings and vaccines you need and how often you need them. This information is not intended to replace advice given to you by your health care provider. Make sure you discuss any questions you have with your health care provider. Document Released: 04/07/2015 Document Revised: 11/29/2015 Document Reviewed: 01/10/2015 Elsevier Interactive Patient Education  2017 [place] Prevention in the Home Falls can cause injuries. They can happen to people of all ages. There are many things you can do to make your home safe and to help prevent falls. What can I do on the outside of my home?  Regularly fix the edges of walkways and driveways and fix any cracks.  Remove anything that might make you trip as you walk through a door, such as a raised step or threshold.  Trim any bushes or trees on the path to your home.  Use bright outdoor lighting.  Clear any walking paths of anything that might make someone trip, such as rocks or tools.  Regularly check to see if handrails are loose or broken. Make sure that both sides of any steps have handrails.  Any raised decks and porches should have guardrails on the edges.  Have any leaves, snow, or ice cleared regularly.  Use sand or salt on walking paths during winter.  Clean up any spills in your garage right away. This includes oil or grease spills. What can I do in the bathroom?  Use night lights.  Install grab bars by the toilet and in the tub and shower. Do not use towel bars as grab bars.  Use non-skid mats or decals in the tub or shower.  If you need to sit down in the shower, use a plastic, non-slip stool.  Keep the floor dry. Clean up any water that spills on the floor as soon as it happens.  Remove soap buildup in the tub or shower regularly.  Attach bath mats  securely with double-sided non-slip rug tape.  Do not have throw rugs and other things on the floor that can make you trip. What can I do in the bedroom?  Use night lights.  Make sure that you have a light by your bed that is easy to reach.  Do not use any sheets or blankets that are too big for your bed. They should not hang down onto the floor.  Have a firm chair that has side arms. You can use this for support while you get dressed.  Do not have throw rugs and other things on the floor that can make you trip. What can I do in the kitchen?  Clean up any spills right away.  Avoid walking on wet floors.  Keep items that you use a lot in easy-to-reach places.  If you need to reach something above you, use a strong step stool that has a grab bar.  Keep electrical cords out of the way.  Do not use floor polish or wax that makes floors slippery. If you must use wax, use non-skid floor wax.  Do not have throw rugs and other things on the floor that can make you trip. What can I do  with my stairs?  Do not leave any items on the stairs.  Make sure that there are handrails on both sides of the stairs and use them. Fix handrails that are broken or loose. Make sure that handrails are as long as the stairways.  Check any carpeting to make sure that it is firmly attached to the stairs. Fix any carpet that is loose or worn.  Avoid having throw rugs at the top or bottom of the stairs. If you do have throw rugs, attach them to the floor with carpet tape.  Make sure that you have a light switch at the top of the stairs and the bottom of the stairs. If you do not have them, ask someone to add them for you. What else can I do to help prevent falls?  Wear shoes that:  Do not have high heels.  Have rubber bottoms.  Are comfortable and fit you well.  Are closed at the toe. Do not wear sandals.  If you use a stepladder:  Make sure that it is fully opened. Do not climb a closed  stepladder.  Make sure that both sides of the stepladder are locked into place.  Ask someone to hold it for you, if possible.  Clearly mark and make sure that you can see:  Any grab bars or handrails.  First and last steps.  Where the edge of each step is.  Use tools that help you move around (mobility aids) if they are needed. These include:  Canes.  Walkers.  Scooters.  Crutches.  Turn on the lights when you go into a dark area. Replace any light bulbs as soon as they burn out.  Set up your furniture so you have a clear path. Avoid moving your furniture around.  If any of your floors are uneven, fix them.  If there are any pets around you, be aware of where they are.  Review your medicines with your doctor. Some medicines can make you feel dizzy. This can increase your chance of falling. Ask your doctor what other things that you can do to help prevent falls. This information is not intended to replace advice given to you by your health care provider. Make sure you discuss any questions you have with your health care provider. Document Released: 01/05/2009 Document Revised: 08/17/2015 Document Reviewed: 04/15/2014 Elsevier Interactive Patient Education  2017 [employer].

## 2018-12-30 ENCOUNTER — Other Ambulatory Visit: Payer: Self-pay | Admitting: Family Medicine

## 2019-01-03 ENCOUNTER — Other Ambulatory Visit: Payer: Self-pay | Admitting: Family Medicine

## 2019-01-19 ENCOUNTER — Encounter: Payer: Self-pay | Admitting: Family Medicine

## 2019-01-19 ENCOUNTER — Other Ambulatory Visit: Payer: Self-pay

## 2019-01-19 ENCOUNTER — Ambulatory Visit (INDEPENDENT_AMBULATORY_CARE_PROVIDER_SITE_OTHER): Payer: PPO | Admitting: Family Medicine

## 2019-01-19 VITALS — BP 110/68 | HR 93 | Temp 98.7°F | Resp 14 | Ht 63.0 in | Wt 105.0 lb

## 2019-01-19 DIAGNOSIS — I10 Essential (primary) hypertension: Secondary | ICD-10-CM | POA: Diagnosis not present

## 2019-01-19 DIAGNOSIS — R7303 Prediabetes: Secondary | ICD-10-CM | POA: Diagnosis not present

## 2019-01-19 DIAGNOSIS — E1151 Type 2 diabetes mellitus with diabetic peripheral angiopathy without gangrene: Secondary | ICD-10-CM | POA: Diagnosis not present

## 2019-01-19 DIAGNOSIS — E440 Moderate protein-calorie malnutrition: Secondary | ICD-10-CM | POA: Diagnosis not present

## 2019-01-19 DIAGNOSIS — Z1211 Encounter for screening for malignant neoplasm of colon: Secondary | ICD-10-CM

## 2019-01-19 DIAGNOSIS — Z122 Encounter for screening for malignant neoplasm of respiratory organs: Secondary | ICD-10-CM | POA: Diagnosis not present

## 2019-01-19 DIAGNOSIS — R7301 Impaired fasting glucose: Secondary | ICD-10-CM

## 2019-01-19 DIAGNOSIS — G8929 Other chronic pain: Secondary | ICD-10-CM | POA: Diagnosis not present

## 2019-01-19 DIAGNOSIS — F17218 Nicotine dependence, cigarettes, with other nicotine-induced disorders: Secondary | ICD-10-CM

## 2019-01-19 DIAGNOSIS — Z23 Encounter for immunization: Secondary | ICD-10-CM

## 2019-01-19 DIAGNOSIS — Z1231 Encounter for screening mammogram for malignant neoplasm of breast: Secondary | ICD-10-CM

## 2019-01-19 DIAGNOSIS — E559 Vitamin D deficiency, unspecified: Secondary | ICD-10-CM | POA: Diagnosis not present

## 2019-01-19 DIAGNOSIS — E785 Hyperlipidemia, unspecified: Secondary | ICD-10-CM

## 2019-01-19 LAB — POC HEMOCCULT BLD/STL (OFFICE/1-CARD/DIAGNOSTIC): Fecal Occult Blood, POC: NEGATIVE

## 2019-01-19 MED ORDER — HYDROCODONE-ACETAMINOPHEN 7.5-325 MG PO TABS: ORAL_TABLET | ORAL | 0 refills | Status: AC

## 2019-01-19 NOTE — Patient Instructions (Signed)
F/U in office with MD in 12 weeks, call if you need me sooner, weight and pain management  Please schedule mammogram at checkout   Flu vaccine today  No hidden blood in stool   You are referred for chest scan , important you get this  Please stop smoking again, now smoking 7/day, need to [place] today, CBC, lipid panel, cmp and EGFR, hBA1C, TSH and vit D

## 2019-01-25 ENCOUNTER — Encounter: Payer: Self-pay | Admitting: Family Medicine

## 2019-01-25 DIAGNOSIS — F17200 Nicotine dependence, unspecified, uncomplicated: Secondary | ICD-10-CM | POA: Insufficient documentation

## 2019-01-25 DIAGNOSIS — E440 Moderate protein-calorie malnutrition: Secondary | ICD-10-CM | POA: Insufficient documentation

## 2019-01-25 MED ORDER — HYDROCODONE-ACETAMINOPHEN 7.5-325 MG PO TABS: ORAL_TABLET | ORAL | 0 refills | Status: AC

## 2019-01-25 MED ORDER — HYDROCODONE-ACETAMINOPHEN 7.5-325 MG PO TABS: ORAL_TABLET | ORAL | 0 refills | Status: DC

## 2019-01-25 NOTE — Assessment & Plan Note (Signed)
The patient's Controlled Substance registry is reviewed and compliance confirmed. Adequacy of  Pain control and level of function is assessed. Medication dosing is adjusted as deemed appropriate. Twelve weeks of medication is prescribed , patient signs for the script and is provided with a follow up appointment between 11 to 12 weeks .  

## 2019-01-25 NOTE — Progress Notes (Signed)
   Carla [name]     MRN: [id number]      DOB: [birth year]   HPI [name] is here for follow up and re-evaluation of chronic medical conditions, medication management and review of any available recent lab and radiology data.  Preventive health is updated, specifically  Cancer screening and Immunization.   sed. The PT denies any adverse reactions to current medications since the last visit.  C/o anorexia , early satiety and weight loss C/o sciatic nerve pain, no new neuropathy, or change in strength. ROS Denies recent fever or chills. Denies sinus pressure, nasal congestion, ear pain or sore throat. Denies chest congestion, productive cough or wheezing. Denies chest pains, palpitations and leg swelling Denies abdominal pain, nausea, vomiting,diarrhea or constipation.   Denies dysuria, frequency, hesitancy or incontinence. C/o  joint pain,  and limitation in mobility. Denies headaches, seizures,  Denies depression, anxiety or insomnia. Denies skin break down or rash.   PE  BP 110/68   Pulse 93   Temp 98.7 F (37.1 C)   Resp 14   Ht 5\' 3"  (1.6 m)   Wt 105 lb (47.6 kg)   BMI 18.60 kg/m   Patient alert and oriented and in no cardiopulmonary distress. HEENT: EOMI, neck decreased ROM .  Chest: Clear to auscultation bilaterally.decreased air entry bilaterally  CVS: S1, S2 no murmurs, no S3.Regular rate.  ABD: Soft non tender.   Ext: No edema  MS: Adequate though reduced ROM spine, adequate In shoulders, hips and knees.  Skin: Intact, no ulcerations or rash noted.  Psych: Good eye contact, normal affect. Memory slightly impaired not anxious but  depressed appearing.  CNS: CN 2-12 intact, power,  normal throughout.no focal deficits noted.   Assessment & Plan  Encounter for chronic pain management The patient's Controlled Substance registry is reviewed and compliance confirmed. Adequacy of  Pain control and level of function is assessed. Medication dosing is  adjusted as deemed appropriate. Twelve weeks of medication is prescribed , patient signs for the script and is provided with a follow up appointment between 11 to 12 weeks .   Malnutrition of moderate degree (HCC) Rectal exam for stool testing in office is heme negative, will ensuer that she gets chest scan and mammogram which are past due  Nicotine addiction Asked:confirms currently smokes cigarettes Assess: Unwilling to quit but cutting back Advise: needs to QUIT to reduce risk of cancer, cardio and cerebrovascular disease Assist: counseled for 5 minutes and literature provided Arrange: follow up in 3 months

## 2019-01-25 NOTE — Assessment & Plan Note (Signed)
Rectal exam for stool testing in office is heme negative, will ensuer that she gets chest scan and mammogram which are past due

## 2019-01-25 NOTE — Assessment & Plan Note (Signed)
Asked:confirms currently smokes cigarettes Assess: Unwilling to quit but cutting back Advise: needs to QUIT to reduce risk of cancer, cardio and cerebrovascular disease Assist: counseled for 5 minutes and literature provided Arrange: follow up in 3 months  

## 2019-01-28 ENCOUNTER — Ambulatory Visit (HOSPITAL_COMMUNITY): Payer: PPO

## 2019-02-07 ENCOUNTER — Emergency Department (HOSPITAL_COMMUNITY)
Admission: EM | Admit: 2019-02-07 | Discharge: 2019-02-07 | Disposition: A | Discharge status: Home / Self Care | Payer: PPO | Attending: Emergency Medicine | Admitting: Emergency Medicine

## 2019-02-07 ENCOUNTER — Other Ambulatory Visit: Payer: Self-pay

## 2019-02-07 ENCOUNTER — Emergency Department (HOSPITAL_COMMUNITY): Payer: PPO

## 2019-02-07 ENCOUNTER — Encounter (HOSPITAL_COMMUNITY): Payer: Self-pay | Admitting: Emergency Medicine

## 2019-02-07 DIAGNOSIS — R410 Disorientation, unspecified: Secondary | ICD-10-CM | POA: Diagnosis not present

## 2019-02-07 DIAGNOSIS — Z7729 Contact with and (suspected ) exposure to other hazardous substances: Secondary | ICD-10-CM | POA: Diagnosis not present

## 2019-02-07 DIAGNOSIS — R0902 Hypoxemia: Secondary | ICD-10-CM | POA: Diagnosis not present

## 2019-02-07 DIAGNOSIS — Z7982 Long term (current) use of aspirin: Secondary | ICD-10-CM | POA: Insufficient documentation

## 2019-02-07 DIAGNOSIS — E860 Dehydration: Secondary | ICD-10-CM | POA: Insufficient documentation

## 2019-02-07 DIAGNOSIS — J439 Emphysema, unspecified: Secondary | ICD-10-CM | POA: Diagnosis not present

## 2019-02-07 DIAGNOSIS — R404 Transient alteration of awareness: Secondary | ICD-10-CM | POA: Diagnosis not present

## 2019-02-07 DIAGNOSIS — E876 Hypokalemia: Secondary | ICD-10-CM | POA: Diagnosis not present

## 2019-02-07 DIAGNOSIS — Z79899 Other long term (current) drug therapy: Secondary | ICD-10-CM | POA: Diagnosis not present

## 2019-02-07 DIAGNOSIS — F17210 Nicotine dependence, cigarettes, uncomplicated: Secondary | ICD-10-CM | POA: Diagnosis not present

## 2019-02-07 DIAGNOSIS — T59891A Toxic effect of other specified gases, fumes and vapors, accidental (unintentional), initial encounter: Secondary | ICD-10-CM | POA: Diagnosis not present

## 2019-02-07 DIAGNOSIS — R062 Wheezing: Secondary | ICD-10-CM | POA: Diagnosis present

## 2019-02-07 DIAGNOSIS — R000 Tachycardia, unspecified: Secondary | ICD-10-CM | POA: Diagnosis not present

## 2019-02-07 DIAGNOSIS — I10 Essential (primary) hypertension: Secondary | ICD-10-CM | POA: Diagnosis not present

## 2019-02-07 DIAGNOSIS — T5991XA Toxic effect of unspecified gases, fumes and vapors, accidental (unintentional), initial encounter: Secondary | ICD-10-CM | POA: Diagnosis not present

## 2019-02-07 DIAGNOSIS — R918 Other nonspecific abnormal finding of lung field: Secondary | ICD-10-CM | POA: Diagnosis not present

## 2019-02-07 LAB — CBC WITH DIFFERENTIAL/PLATELET
Abs Immature Granulocytes: 0.03 10*3/uL (ref 0.00–0.07)
Basophils Absolute: 0.1 10*3/uL (ref 0.0–0.1)
Basophils Relative: 1 %
Eosinophils Absolute: 0.1 10*3/uL (ref 0.0–0.5)
Eosinophils Relative: 1 %
HCT: 49.4 % — ABNORMAL HIGH (ref 36.0–46.0)
Hemoglobin: 16.1 g/dL — ABNORMAL HIGH (ref 12.0–15.0)
Immature Granulocytes: 0 %
Lymphocytes Relative: 18 %
Lymphs Abs: 2 10*3/uL (ref 0.7–4.0)
MCH: 30.1 pg (ref 26.0–34.0)
MCHC: 32.6 g/dL (ref 30.0–36.0)
MCV: 92.3 fL (ref 80.0–100.0)
Monocytes Absolute: 0.7 10*3/uL (ref 0.1–1.0)
Monocytes Relative: 6 %
Neutro Abs: 8 10*3/uL — ABNORMAL HIGH (ref 1.7–7.7)
Neutrophils Relative %: 74 %
Platelets: 264 10*3/uL (ref 150–400)
RBC: 5.35 MIL/uL — ABNORMAL HIGH (ref 3.87–5.11)
RDW: 15.5 % (ref 11.5–15.5)
WBC: 10.9 10*3/uL — ABNORMAL HIGH (ref 4.0–10.5)
nRBC: 0 % (ref 0.0–0.2)

## 2019-02-07 LAB — COMPREHENSIVE METABOLIC PANEL
ALT: 19 U/L (ref 0–44)
AST: 25 U/L (ref 15–41)
Albumin: 4.4 g/dL (ref 3.5–5.0)
Alkaline Phosphatase: 80 U/L (ref 38–126)
Anion gap: 10 (ref 5–15)
BUN: 14 mg/dL (ref 8–23)
CO2: 30 mmol/L (ref 22–32)
Calcium: 10.2 mg/dL (ref 8.9–10.3)
Chloride: 102 mmol/L (ref 98–111)
Creatinine, Ser: 0.73 mg/dL (ref 0.44–1.00)
GFR calc Af Amer: >60 mL/min (ref >60)
GFR calc non Af Amer: >60 mL/min (ref >60)
Glucose, Bld: 153 mg/dL — ABNORMAL HIGH (ref 70–99)
Potassium: 2.7 mmol/L — CL (ref 3.5–5.1)
Sodium: 142 mmol/L (ref 135–145)
Total Bilirubin: 0.5 mg/dL (ref 0.3–1.2)
Total Protein: 7.2 g/dL (ref 6.5–8.1)

## 2019-02-07 MED ORDER — MAGNESIUM SULFATE IN D5W 1-5 GM/100ML-% IV SOLN
1.0000 g | Freq: Once | INTRAVENOUS | Status: AC
  Administered 2019-02-07: 1 g via INTRAVENOUS
  Filled 2019-02-07: qty 100

## 2019-02-07 MED ORDER — SODIUM CHLORIDE 0.9 % IV BOLUS
1000.0000 mL | Freq: Once | INTRAVENOUS | Status: AC
  Administered 2019-02-07: 1000 mL via INTRAVENOUS

## 2019-02-07 MED ORDER — ALBUTEROL SULFATE HFA 108 (90 BASE) MCG/ACT IN AERS
8.0000 | INHALATION_SPRAY | Freq: Once | RESPIRATORY_TRACT | Status: AC
  Administered 2019-02-07: 8 via RESPIRATORY_TRACT
  Filled 2019-02-07: qty 6.7

## 2019-02-07 MED ORDER — POTASSIUM CHLORIDE CRYS ER 20 MEQ PO TBCR: 20.0000 meq | EXTENDED_RELEASE_TABLET | Freq: Two times a day (BID) | ORAL | 0 refills | Status: DC

## 2019-02-07 MED ORDER — POTASSIUM CHLORIDE CRYS ER 20 MEQ PO TBCR
40.0000 meq | EXTENDED_RELEASE_TABLET | Freq: Once | ORAL | Status: AC
  Administered 2019-02-07: 40 meq via ORAL
  Filled 2019-02-07: qty 2

## 2019-02-07 MED ORDER — AEROCHAMBER Z-STAT PLUS/MEDIUM MISC
1.0000 | Freq: Once | Status: AC
  Administered 2019-02-07: 1
  Filled 2019-02-07: qty 1

## 2019-02-07 MED ORDER — METHYLPREDNISOLONE SODIUM SUCC 125 MG IJ SOLR
125.0000 mg | Freq: Once | INTRAMUSCULAR | Status: AC
  Administered 2019-02-07: 125 mg via INTRAVENOUS
  Filled 2019-02-07: qty 2

## 2019-02-07 NOTE — ED Notes (Signed)
Date and time results received: 02/07/19 6:53 AM  (use smartphrase ".now" to insert current time)  Test: potassium  Critical Value: 2.7  Name of Provider Notified: knapp  Orders Received? Or Actions Taken?: Orders Received - See Orders for details

## 2019-02-07 NOTE — ED Notes (Signed)
Left voicemail for spouse.

## 2019-02-07 NOTE — ED Provider Notes (Signed)
[facility] EMERGENCY DEPARTMENT Provider Note   CSN: [id number] Arrival date & time: 02/07/19  0446   Time seen 5:16 AM  History   Chief Complaint Chief Complaint  Patient presents with  . Chemical Exposure    HPI Carla [name] is a 73 y.o. female.   Level 5 caveat for confusion  HPI when I entered the room patient waffles between not remembering coming to the ED to remembering coming to the ED.  She states she had had a pot that had burned or dried food in it and she was using Clorox and other cleaning products to clean it.  She does not know who called EMS.  She thinks maybe remembering being short of breath but then she does not.  She denies shortness of breath now, chest pain, headache, nausea, or vomiting.  She states she feels fine except she feels like she is wheezing.  She states "my husband just wants to get rid of me".  When I asked her why she states "he cannot stand me".  EMS reports her initial pulse ox was 66% on room air.  EMS placed her on a nonrebreather mask and her pulse ox improved.     PCP Fayrene Helper, MD   Past Medical History:  Diagnosis Date  . ASCVD (arteriosclerotic cardiovascular disease)    No critical disease on cath in 8/97: mild AI with trival, if any l AS; negative stress nuclear in 2010  . Back pain   . Cervical disc herniation   . Chronic bronchitis   . Colon polyps   . Depression   . Elevated hemoglobin A1c   . Fasting hyperglycemia   . GERD (gastroesophageal reflux disease)   . HSV (herpes simplex virus) infection   . Hydronephrosis   . Hypertension   . Osteoporosis   . Peripheral vascular disease ([place])    stauts post aortabifemoral graft    . Secondary erythrocytosis 08/05/2012   Secondary to COPD  . Tobacco abuse    has stopped    Patient Active Problem List   Diagnosis Date Noted  . Malnutrition of moderate degree ([place]) 01/25/2019  . Nicotine addiction 01/25/2019  . Prediabetes 01/19/2019  . Shortness of  breath 09/21/2018  . Anxiety attack 09/21/2018  . Generalized weakness 09/21/2018  . Hydronephrosis of right kidney 11/01/2017  . MCI (mild cognitive impairment) with memory loss 01/27/2017  . De Quervain's disease (radial styloid tenosynovitis) 10/14/2016  . Atrophic vaginitis 04/18/2016  . Onychomycosis 07/08/2015  . Western blot positive HSV2 02/12/2015  . Neck muscle spasm 04/23/2014  . GAD (generalized anxiety disorder) 02/13/2014  . Seasonal allergies 03/30/2013  . Cervical neck pain with evidence of disc disease 11/03/2012  . Secondary erythrocytosis 08/05/2012  . Encounter for chronic pain management 04/14/2012  . Hearing loss 04/13/2012  . Carotid bruit 04/13/2012  . Back pain with radiation 10/15/2011  . Emphysema with chronic bronchitis ([place]) 01/17/2011  . Arteriosclerotic cardiovascular disease (ASCVD) 09/17/2009  . PERIPHERAL VASCULAR DISEASE 09/17/2009  . GASTROESOPHAGEAL REFLUX DISEASE 09/17/2009  . Primary hyperparathyroidism ([place]) 01/20/2009  . Vitamin D deficiency 01/20/2009  . HYPERCALCEMIA 12/30/2008  . Hyperlipidemia LDL goal <100 06/12/2007  . Depression 06/12/2007  . Essential hypertension 06/12/2007  . Osteoporosis 06/12/2007    Past Surgical History:  Procedure Laterality Date  . ABDOMINAL HYSTERECTOMY    . aorta bifem graft    . CHOLECYSTECTOMY    . COLONOSCOPY  06/23/2006   Dr. Fields:Normal colon without evidence of polyps,  masses, inflammatory changes/Normal retroflex view of the rectum  . COLONOSCOPY N/A 08/24/2013   Procedure: COLONOSCOPY;  Surgeon: Danie Binder, MD;  Location: [facility]O SUITE;  Service: Endoscopy;  Laterality: N/A;  10:30-moved to [place] notified pt  . lysis of adhesions  2000  . PARTIAL HYSTERECTOMY    . right kidney surgery following damage during arterial surgery    . TOTAL ABDOMINAL HYSTERECTOMY W/ BILATERAL SALPINGOOPHORECTOMY  1975     OB History   No obstetric history on file.      Home Medications     Prior to Admission medications   Medication Sig Start Date End Date Taking? Authorizing Provider  alendronate (FOSAMAX) 70 MG tablet TAKE 1 TABLET EACH WEEK 30 MIN BEFORE BREAKFAST WITH 8 OUNCES OF WATER FOR OSTEOPOROSIS. Patient taking differently: Take 70 mg by mouth once a week.  06/05/15   Fayrene Helper, MD  ALPRAZolam Duanne Moron) 0.5 MG tablet Take one tablet two times daily by mouth for anxiety 10/28/18 10/27/19  Fayrene Helper, MD  amLODipine (NORVASC) 10 MG tablet TAKE ONE TABLET BY MOUTH ONCE DAILY. 01/04/19   Fayrene Helper, MD  aspirin EC 81 MG tablet Take 81 mg by mouth daily.    [provider]  gabapentin (NEURONTIN) 100 MG capsule TAKE (2) CAPSULES BY MOUTH AT BEDTIME. 09/16/18   Fayrene Helper, MD  HYDROcodone-acetaminophen [facility]) 7.5-325 MG tablet Take one tablet two times daily by mouth, for pain 10/29/18   Fayrene Helper, MD  HYDROcodone-acetaminophen [facility]) 7.5-325 MG tablet Take one tablet by mouth two times daily for back pain 01/25/19 02/25/19  Fayrene Helper, MD  HYDROcodone-acetaminophen [facility]) 7.5-325 MG tablet Take one tablet by mouth two times daily for chronic pain 02/24/19 03/26/19  Fayrene Helper, MD  HYDROcodone-acetaminophen [facility]) 7.5-325 MG tablet Take  One tablet by mouth two times daily for chronic pain 03/27/19 04/26/19  Fayrene Helper, MD  montelukast (SINGULAIR) 10 MG tablet TAKE (1) TABLET BY MOUTH AT BEDTIME. 10/19/18   Fayrene Helper, MD  Multiple Vitamin (MULTIVITAMIN WITH MINERALS) TABS Take 1 tablet by mouth every evening.     [provider]  potassium chloride SA (K-DUR) 20 MEQ tablet Take 1 tablet (20 mEq total) by mouth 2 (two) times daily. 09/22/18   Idol, Almyra Free, PA-C  PROCTO-MED HC 2.5 % rectal cream APPLY SPARINGLY TO THE VAGINA AREA TWICE WEEKLY AS NEEDED FOR ITCHING. 12/30/18   Fayrene Helper, MD  rosuvastatin (CRESTOR) 40 MG tablet Take 1 tablet (40 mg total) by mouth daily. 06/29/18   Fayrene Helper, MD  saccharomyces boulardii (FLORASTOR) 250 MG capsule Take 1 capsule (250 mg total) by mouth 2 (two) times daily. 11/02/17   Barton Dubois, MD    Family History Family History  Problem Relation Age of Onset  . Heart attack Father   . COPD Sister   . Heart disease Sister   . Diabetes Sister   . Coronary artery disease Sister   . Liver cancer Sister 49  . Diabetes Sister   . Diabetes Sister   . COPD Sister   . Colon cancer Neg Hx     Social History Social History   Tobacco Use  . Smoking status: Current Every Day Smoker    Packs/day: 0.50    Years: 50.00    Pack years: 25.00    Types: Cigarettes    Last attempt to quit: 12/12/2010    Years since quitting: 8.1  .  Smokeless tobacco: Never Used  Substance Use Topics  . Alcohol use: No  . Drug use: No  Lives at home Lives with spouse States she smokes a half a pack a day   Allergies   Aricept [donepezil hcl], Calcium-containing compounds, and Exelon [rivastigmine tartrate]   Review of Systems Review of Systems  Unable to perform ROS: Mental status change     Physical Exam Updated Vital Signs BP (!) 157/87 (BP Location: Right Arm)   Pulse (!) 115   Temp 97.7 F (36.5 C) (Oral)   Resp 20   Ht 5\' 2"  (1.575 m)   Wt 61.7 kg   SpO2 96%   BMI 24.87 kg/m   Vital signs normal except for tachycardia   Physical Exam Vitals signs and nursing note reviewed.  HENT:     Head: Normocephalic and atraumatic.     Right Ear: External ear normal.     Left Ear: External ear normal.     Mouth/Throat:     Mouth: Mucous membranes are moist.     Pharynx: No oropharyngeal exudate or posterior oropharyngeal erythema.  Eyes:     Extraocular Movements: Extraocular movements intact.     Conjunctiva/sclera: Conjunctivae normal.     Pupils: Pupils are equal, round, and reactive to light.  Neck:     Musculoskeletal: Normal range of motion.  Cardiovascular:     Rate and Rhythm: Normal rate and regular rhythm.   Pulmonary:     Effort: Pulmonary effort is normal. No tachypnea, accessory muscle usage, prolonged expiration or respiratory distress.     Breath sounds: Rhonchi present.     Comments: Patient has a deep smoker's cough Musculoskeletal: Normal range of motion.     Right lower leg: No edema.     Left lower leg: No edema.  Skin:    General: Skin is warm and dry.     Findings: No erythema.  Neurological:     General: No focal deficit present.     Mental Status: She is alert.     Cranial Nerves: No cranial nerve deficit.     Comments: Patient's orientation seems to be waxing and waning, at first she told me she did not know she was in the ER, she told me she did not remember the nurses talking to her and then she did.  Psychiatric:        Mood and Affect: Mood normal.        Behavior: Behavior normal.        Thought Content: Thought content normal.      ED Treatments / Results  Labs (all labs ordered are listed, but only abnormal results are displayed) Results for orders placed or performed during the hospital encounter of 02/07/19  Comprehensive metabolic panel  Result Value Ref Range   Sodium 142 135 - 145 mmol/L   Potassium 2.7 (LL) 3.5 - 5.1 mmol/L   Chloride 102 98 - 111 mmol/L   CO2 30 22 - 32 mmol/L   Glucose, Bld 153 (H) 70 - 99 mg/dL   BUN 14 8 - 23 mg/dL   Creatinine, Ser 0.73 0.44 - 1.00 mg/dL   Calcium 10.2 8.9 - 10.3 mg/dL   Total Protein 7.2 6.5 - 8.1 g/dL   Albumin 4.4 3.5 - 5.0 g/dL   AST 25 15 - 41 U/L   ALT 19 0 - 44 U/L   Alkaline Phosphatase 80 38 - 126 U/L   Total Bilirubin 0.5 0.3 - 1.2 mg/dL  GFR calc non Af Amer >60 >60 mL/min   GFR calc Af Amer >60 >60 mL/min   Anion gap 10 5 - 15  CBC with Differential  Result Value Ref Range   WBC 10.9 (H) 4.0 - 10.5 K/uL   RBC 5.35 (H) 3.87 - 5.11 MIL/uL   Hemoglobin 16.1 (H) 12.0 - 15.0 g/dL   HCT 49.4 (H) 36.0 - 46.0 %   MCV 92.3 80.0 - 100.0 fL   MCH 30.1 26.0 - 34.0 pg   MCHC 32.6 30.0 - 36.0 g/dL    RDW 15.5 11.5 - 15.5 %   Platelets 264 150 - 400 K/uL   nRBC 0.0 0.0 - 0.2 %   Neutrophils Relative % 74 %   Neutro Abs 8.0 (H) 1.7 - 7.7 K/uL   Lymphocytes Relative 18 %   Lymphs Abs 2.0 0.7 - 4.0 K/uL   Monocytes Relative 6 %   Monocytes Absolute 0.7 0.1 - 1.0 K/uL   Eosinophils Relative 1 %   Eosinophils Absolute 0.1 0.0 - 0.5 K/uL   Basophils Relative 1 %   Basophils Absolute 0.1 0.0 - 0.1 K/uL   Immature Granulocytes 0 %   Abs Immature Granulocytes 0.03 0.00 - 0.07 K/uL    Laboratory interpretation all normal except hypokalemia, elevation of hemoglobin compared to 13.5 on June 30 probably from dehydration, mild leukocytosis   EKG EKG Interpretation  Date/Time:  Sunday February 07 2019 04:57:31 EST Ventricular Rate:  119 PR Interval:    QRS Duration: 105 QT Interval:  339 QTC Calculation: 475 R Axis:   79 Text Interpretation: Sinus tachycardia Ventricular premature complex Consider right atrial enlargement Borderline low voltage, extremity leads Nonspecific T abnormalities, lateral leads Baseline wander in lead(s) II III aVF Since last tracing rate faster 22 Sep 2018 Confirmed by Rolland Porter [phone]) on 02/07/2019 6:38:30 AM   Radiology Dg Chest Port 1 View  Result Date: 02/07/2019 CLINICAL DATA:  Chemical exposure. EXAM: PORTABLE CHEST 1 VIEW COMPARISON:  09/22/2018. FINDINGS: The heart size appears within normal limits. Aortic atherosclerosis. No pleural effusion, interstitial edema or airspace consolidation. Lungs are hyperinflated and there are coarsened interstitial markings within both lungs compatible with emphysema. IMPRESSION: No acute cardiopulmonary abnormalities. Hyperinflation with chronic changes of emphysema. Electronically Signed   By: Kerby Moors M.D.   On: 02/07/2019 05:55    Procedures Procedures (including critical care time)  Medications Ordered in ED Medications  magnesium sulfate IVPB 1 g 100 mL (1 g Intravenous New Bag/Given 02/07/19 0649)   potassium chloride SA (KLOR-CON) CR tablet 40 mEq (has no administration in time range)  albuterol (VENTOLIN HFA) 108 (90 Base) MCG/ACT inhaler 8 puff (has no administration in time range)  sodium chloride 0.9 % bolus 1,000 mL (has no administration in time range)  methylPREDNISolone sodium succinate (SOLU-MEDROL) 125 mg/2 mL injection 125 mg (125 mg Intravenous Given 02/07/19 0612)  albuterol (VENTOLIN HFA) 108 (90 Base) MCG/ACT inhaler 8 puff (8 puffs Inhalation Given 02/07/19 0612)  aerochamber Z-Stat Plus/medium 1 each (1 each Other Given 02/07/19 [id number])     Initial Impression / Assessment and Plan / ED Course  I have reviewed the triage vital signs and the nursing notes.  Pertinent labs & imaging results that were available during my care of the patient were reviewed by me and considered in my medical decision making (see chart for details).       Patient has a chronic renal insufficiency when I review her labs.  On June 30  of her BUN was 19 and her creatinine was 1.24.  Her GFR was only 43.  Therefore she was only given 1 g of IV magnesium for her respiratory symptoms, she also was given IV Solu-Medrol.  She was given 8 puffs of albuterol inhaler.  Laboratory testing and chest x-ray was done.  Patient was hypokalemic, she was given potassium orally.  Recheck at 7:15 AM patient is sleeping peacefully.  When awakened she states her breathing is better.  Her pulse ox is 98% on 2 L nasal cannula.  When I listen to her lungs she initially had some rhonchi in the bases however after a few big breaths they cleared.  Her albuterol 8 puffs was repeated.  Her hemoglobin is very elevated compared to just a few months ago, she was given a liter of IV fluids for possible dehydration.  7:20 AM patient turned over to Dr. Lacinda Axon at change of shift to see how patient is doing at some point off of oxygen.  Final Clinical Impressions(s) / ED Diagnoses   Final diagnoses:  Toxic inhalation injury,  accidental or unintentional, initial encounter  Hypoxia  Hypokalemia  Dehydration    Disposition pending per Dr. Jolene Provost, MD, [place]onia Small, Daleen Bo, MD 02/07/19 0730

## 2019-02-07 NOTE — ED Provider Notes (Signed)
Patient was ambulated without difficulty.  No dyspnea or tachypnea noted.  Pulse ox remains in the low side.  Patient is not in respiratory extremis.  Stable for discharge.  Discharge medication potassium 20 mEq twice a day for 1 week.  Primary care follow-up.   Nat Christen, MD 02/07/19 1121

## 2019-02-07 NOTE — Discharge Instructions (Addendum)
Chest x-ray showed no pneumonia.  Avoid combining cleaning products.  Your potassium is low today.  This will need replacement.  Prescription for 1 week.  Follow-up with your primary care doctor.

## 2019-02-07 NOTE — ED Triage Notes (Signed)
Pt arrives via [facility]. EMS called out for chemical exposure. Pt was cleaning pots w/mixture of pine sol, chlorox, flea killer, palm olive & dawn. Pt slightly confused upon EMS arrival, O2 66% on room air upon arrival, EMS placed on NRB, pt at 94% on 2L at this time. Pt A&O x4 in ED. Pt has cough that started 2 days ago, non-productive.

## 2019-02-07 NOTE — ED Notes (Signed)
Patient ambulated around nurses station, 02 remained at 98% room air.

## 2019-02-07 NOTE — ED Notes (Signed)
Per [facility] Control, she recommends chest x-ray, bronchodilators, hydration & observe until pt is back to baseline.

## 2019-02-07 NOTE — ED Notes (Signed)
Poison Control updated per status

## 2019-02-10 ENCOUNTER — Ambulatory Visit (HOSPITAL_COMMUNITY): Admission: RE | Admit: 2019-02-10 | Payer: PPO | Source: Ambulatory Visit

## 2019-03-04 ENCOUNTER — Other Ambulatory Visit: Payer: Self-pay | Admitting: *Deleted

## 2019-03-04 NOTE — Patient Outreach (Signed)
[facility]) Care Management  03/04/2019  Carla [name] 08-13-1945 [id number]   Case reviewed, no patient outreach needed,  and case closed per Bary Castilla, Assistant Clinical Director at [place]  request.   Colbert Coyer. Annia Friendly, BSN, [place] [facility] Telephonic CM Phone: [phone] Fax: [phone]

## 2019-03-11 ENCOUNTER — Other Ambulatory Visit: Payer: Self-pay | Admitting: Family Medicine

## 2019-03-15 ENCOUNTER — Other Ambulatory Visit: Payer: Self-pay | Admitting: Family Medicine

## 2019-03-16 ENCOUNTER — Other Ambulatory Visit: Payer: Self-pay | Admitting: Family Medicine

## 2019-03-22 ENCOUNTER — Other Ambulatory Visit: Payer: Self-pay | Admitting: Family Medicine

## 2019-03-24 ENCOUNTER — Other Ambulatory Visit: Payer: Self-pay | Admitting: Family Medicine

## 2019-03-24 MED ORDER — HYDROCODONE-ACETAMINOPHEN 7.5-325 MG PO TABS: ORAL_TABLET | ORAL | 0 refills | Status: DC

## 2019-04-05 ENCOUNTER — Telehealth: Payer: Self-pay

## 2019-04-05 NOTE — Telephone Encounter (Signed)
My eye Dr is calling to state that the pt has called several times today --confused .Marland Kitchen Wanted to know if she picked up her glass, ( they were processed in 2019) and called back to see if she missed her appt   The Eye Dr wanted to Let Dr Moshe Cipro know

## 2019-04-05 NOTE — Telephone Encounter (Signed)
FYI

## 2019-04-05 NOTE — Telephone Encounter (Signed)
Appointment scheduled for 04/07/19 with Dr.Simpson

## 2019-04-05 NOTE — Telephone Encounter (Signed)
Pls schedule appt for MMSE evaluation soonest available provider

## 2019-04-07 ENCOUNTER — Ambulatory Visit: Payer: PPO | Admitting: Family Medicine

## 2019-04-13 ENCOUNTER — Ambulatory Visit: Payer: PPO | Admitting: Family Medicine

## 2019-04-14 ENCOUNTER — Ambulatory Visit: Payer: PPO | Admitting: Family Medicine

## 2019-04-16 ENCOUNTER — Other Ambulatory Visit: Payer: Self-pay | Admitting: Family Medicine

## 2019-04-20 ENCOUNTER — Other Ambulatory Visit: Payer: Self-pay | Admitting: Family Medicine

## 2019-04-21 ENCOUNTER — Other Ambulatory Visit: Payer: Self-pay

## 2019-04-21 ENCOUNTER — Ambulatory Visit (INDEPENDENT_AMBULATORY_CARE_PROVIDER_SITE_OTHER): Payer: PPO | Admitting: Family Medicine

## 2019-04-21 ENCOUNTER — Encounter: Payer: Self-pay | Admitting: Family Medicine

## 2019-04-21 ENCOUNTER — Other Ambulatory Visit: Payer: Self-pay | Admitting: Family Medicine

## 2019-04-21 VITALS — BP 110/64 | HR 91 | Temp 97.4°F | Resp 18 | Ht 62.0 in | Wt 102.0 lb

## 2019-04-21 DIAGNOSIS — E785 Hyperlipidemia, unspecified: Secondary | ICD-10-CM

## 2019-04-21 DIAGNOSIS — R7303 Prediabetes: Secondary | ICD-10-CM

## 2019-04-21 DIAGNOSIS — R531 Weakness: Secondary | ICD-10-CM

## 2019-04-21 DIAGNOSIS — D582 Other hemoglobinopathies: Secondary | ICD-10-CM

## 2019-04-21 DIAGNOSIS — E440 Moderate protein-calorie malnutrition: Secondary | ICD-10-CM | POA: Diagnosis not present

## 2019-04-21 DIAGNOSIS — Z1231 Encounter for screening mammogram for malignant neoplasm of breast: Secondary | ICD-10-CM | POA: Diagnosis not present

## 2019-04-21 DIAGNOSIS — G8929 Other chronic pain: Secondary | ICD-10-CM | POA: Diagnosis not present

## 2019-04-21 DIAGNOSIS — R634 Abnormal weight loss: Secondary | ICD-10-CM

## 2019-04-21 DIAGNOSIS — G3184 Mild cognitive impairment, so stated: Secondary | ICD-10-CM

## 2019-04-21 DIAGNOSIS — F17218 Nicotine dependence, cigarettes, with other nicotine-induced disorders: Secondary | ICD-10-CM | POA: Diagnosis not present

## 2019-04-21 DIAGNOSIS — I10 Essential (primary) hypertension: Secondary | ICD-10-CM | POA: Diagnosis not present

## 2019-04-21 DIAGNOSIS — R630 Anorexia: Secondary | ICD-10-CM

## 2019-04-21 DIAGNOSIS — R413 Other amnesia: Secondary | ICD-10-CM

## 2019-04-21 MED ORDER — ALPRAZOLAM 0.25 MG PO TABS: 0.2500 mg | ORAL_TABLET | Freq: Two times a day (BID) | ORAL | 0 refills | Status: DC | PRN

## 2019-04-21 MED ORDER — HYDROCODONE-ACETAMINOPHEN 5-325 MG PO TABS: ORAL_TABLET | ORAL | 0 refills | Status: DC

## 2019-04-21 NOTE — Patient Instructions (Addendum)
F/u in office with MD in 3 . 5 weeks, call if you need me sooner, MMSE at visit, and pt to bring all meds  Please schedule chest scan and mammogram  And let spouse know, hopefully once she has had labs when she leaves he can get these appointments in hand today  Urgent referral to gI , Dr Oneida Alar evaluate weight loss to be ordered , I will sign  Labs to be re ordered from those ordered on 10/27 /202, p-lease order as STAT, and add magnesium  Reduce dose of both anxiety and pain medication as discussed  Now smoking 15 ciggs/ day, please cut back so your appetite will improve

## 2019-04-22 LAB — COMPLETE METABOLIC PANEL WITH GFR
AG Ratio: 1.6 (calc) (ref 1.0–2.5)
ALT: 12 U/L (ref 6–29)
AST: 20 U/L (ref 10–35)
Albumin: 4.7 g/dL (ref 3.6–5.1)
Alkaline phosphatase (APISO): 99 U/L (ref 37–153)
BUN: 11 mg/dL (ref 7–25)
CO2: 30 mmol/L (ref 20–32)
Calcium: 10.6 mg/dL — ABNORMAL HIGH (ref 8.6–10.4)
Chloride: 105 mmol/L (ref 98–110)
Creat: 0.71 mg/dL (ref 0.60–0.93)
GFR, Est African American: 97 mL/min/{1.73_m2} (ref >=60)
GFR, Est Non African American: 84 mL/min/{1.73_m2} (ref >=60)
Globulin: 2.9 g/dL (calc) (ref 1.9–3.7)
Glucose, Bld: 123 mg/dL — ABNORMAL HIGH (ref 65–99)
Potassium: 3.9 mmol/L (ref 3.5–5.3)
Sodium: 141 mmol/L (ref 135–146)
Total Bilirubin: 0.4 mg/dL (ref 0.2–1.2)
Total Protein: 7.6 g/dL (ref 6.1–8.1)

## 2019-04-22 LAB — CBC
HCT: 51.2 % — ABNORMAL HIGH (ref 35.0–45.0)
Hemoglobin: 17.1 g/dL — ABNORMAL HIGH (ref 11.7–15.5)
MCH: 30.9 pg (ref 27.0–33.0)
MCHC: 33.4 g/dL (ref 32.0–36.0)
MCV: 92.6 fL (ref 80.0–100.0)
MPV: 9.7 fL (ref 7.5–12.5)
Platelets: 315 10*3/uL (ref 140–400)
RBC: 5.53 10*6/uL — ABNORMAL HIGH (ref 3.80–5.10)
RDW: 13.6 % (ref 11.0–15.0)
WBC: 8.1 10*3/uL (ref 3.8–10.8)

## 2019-04-22 LAB — LIPID PANEL
Cholesterol: 160 mg/dL (ref <200)
HDL: 62 mg/dL (ref >=50)
LDL Cholesterol (Calc): 72 mg/dL (calc)
Non-HDL Cholesterol (Calc): 98 mg/dL (calc) (ref <130)
Total CHOL/HDL Ratio: 2.6 (calc) (ref <5.0)
Triglycerides: 191 mg/dL — ABNORMAL HIGH (ref <150)

## 2019-04-22 LAB — HEMOGLOBIN A1C
Hgb A1c MFr Bld: 5.8 % of total Hgb — ABNORMAL HIGH (ref <5.7)
Mean Plasma Glucose: 120 (calc)
eAG (mmol/L): 6.6 (calc)

## 2019-04-22 LAB — MAGNESIUM: Magnesium: 2.2 mg/dL (ref 1.5–2.5)

## 2019-04-22 LAB — TSH: TSH: 1.77 mIU/L (ref 0.40–4.50)

## 2019-04-26 ENCOUNTER — Encounter: Payer: Self-pay | Admitting: Gastroenterology

## 2019-04-26 DIAGNOSIS — R634 Abnormal weight loss: Secondary | ICD-10-CM | POA: Insufficient documentation

## 2019-04-26 NOTE — Assessment & Plan Note (Signed)
Reviewed registry, medication dose reduced , plan to d/c, pt has lost weight , seems oversedated, and denies any pain

## 2019-04-26 NOTE — Assessment & Plan Note (Signed)
Hyperlipidemia:Low fat diet discussed and encouraged.   Lipid Panel  Lab Results  Component Value Date   CHOL 160 04/21/2019   HDL 62 04/21/2019   LDLCALC 72 04/21/2019   LDLDIRECT 166 (H) 10/11/2009   TRIG 191 (H) 04/21/2019   CHOLHDL 2.6 04/21/2019   Uncontrolled, needs to reduce fat intake

## 2019-04-26 NOTE — Progress Notes (Signed)
   ROSCHELL [name]     MRN: [id number]      DOB: 1945/04/25   HPI [name] is here for follow up and re-evaluation of chronic medical conditions, medication management and review of any available recent lab and radiology data.  Approximate 3 month h/o intermittent episodes of excessive fatigue , poor appetite and weight loss. No h/o fever or chills, no cough, no abdominal pain, no change in stool caliber. Cancer screening tests are past due though she has been referred in the past incresed forgetfullness noted on an intermittent basis  ROS  Denies sinus pressure, nasal congestion, ear pain or sore throat. Denies chest congestion, productive cough or wheezing. Denies chest pains, palpitations and leg swelling Denies abdominal pain, nausea, vomiting,diarrhea or constipation.   Denies dysuria, frequency, hesitancy or incontinence. Denies joint pain, swelling and limitation in mobility. Denies headaches, seizures, numbness, or tingling. Denies uncontrolled  depression, anxiety or insomnia. Denies skin break down or rash.   PE  BP 110/64   Pulse 91   Temp (!) 97.4 F (36.3 C) (Temporal)   Resp 18   Ht 5\' 2"  (1.575 m)   Wt 102 lb (46.3 kg)   SpO2 95%   BMI 18.66 kg/m   Patient drowsy and in no cardiopulmonary distress.  HEENT: No facial asymmetry, EOMI,     Neck supple .  Chest: Clear to auscultation bilaterally.  CVS: S1, S2 no murmurs, no S3.Regular rate.  ABD: Soft non tender.   Ext: No edema  MS: Adequate ROM spine, shoulders, hips and knees.  Skin: Intact, no ulcerations or rash noted.  Psych: Not anxious or depressed appearing.  CNS: CN 2-12 intact, power,  normal throughout.no focal deficits noted.   Assessment & Plan  Weight loss, abnormal 30pound weight loss in past 12 months, with fatigue and anorexia, gI consult needed  Nicotine addiction Asked:confirms currently smokes cigarettes Assess: Unwilling to quit but cutting back Advise: needs to  QUIT to reduce risk of cancer, cardio and cerebrovascular disease Assist: counseled for 5 minutes and literature provided Arrange: follow up in 3 months Updated chest scan needed and will be scheduled asap  Generalized weakness Extensive lab panel to evaluate  Encounter for chronic pain management Reviewed registry, medication dose reduced , plan to d/c, pt has lost weight , seems oversedated, and denies any pain  Essential hypertension Controlled, no change in medication   MCI (mild cognitive impairment) with memory loss Update MMSE next visit  Hyperlipidemia LDL goal <100 Hyperlipidemia:Low fat diet discussed and encouraged.   Lipid Panel  Lab Results  Component Value Date   CHOL 160 04/21/2019   HDL 62 04/21/2019   LDLCALC 72 04/21/2019   LDLDIRECT 166 (H) 10/11/2009   TRIG 191 (H) 04/21/2019   CHOLHDL 2.6 04/21/2019   Uncontrolled, needs to reduce fat intake     Malnutrition of moderate degree (HCC) Refer oncology to evaluate abn weight loss and erythrocytosis

## 2019-04-26 NOTE — Assessment & Plan Note (Signed)
Controlled, no change in medication  

## 2019-04-26 NOTE — Assessment & Plan Note (Signed)
30pound weight loss in past 12 months, with fatigue and anorexia, gI consult needed

## 2019-04-26 NOTE — Assessment & Plan Note (Signed)
Extensive lab panel to evaluate

## 2019-04-26 NOTE — Assessment & Plan Note (Signed)
Asked:confirms currently smokes cigarettes Assess: Unwilling to quit but cutting back Advise: needs to QUIT to reduce risk of cancer, cardio and cerebrovascular disease Assist: counseled for 5 minutes and literature provided Arrange: follow up in 3 months Updated chest scan needed and will be scheduled asap

## 2019-04-26 NOTE — Assessment & Plan Note (Signed)
Update MMSE next visit

## 2019-04-26 NOTE — Assessment & Plan Note (Signed)
Refer oncology to evaluate abn weight loss and erythrocytosis

## 2019-04-27 ENCOUNTER — Other Ambulatory Visit: Payer: Self-pay

## 2019-04-27 ENCOUNTER — Encounter (HOSPITAL_COMMUNITY): Payer: Self-pay | Admitting: *Deleted

## 2019-04-28 ENCOUNTER — Ambulatory Visit (HOSPITAL_COMMUNITY): Payer: PPO | Admitting: Hematology

## 2019-04-30 ENCOUNTER — Ambulatory Visit (HOSPITAL_COMMUNITY)
Admission: RE | Admit: 2019-04-30 | Discharge: 2019-04-30 | Disposition: A | Discharge status: Home / Self Care | Payer: PPO | Source: Ambulatory Visit | Attending: Family Medicine | Admitting: Family Medicine

## 2019-04-30 ENCOUNTER — Other Ambulatory Visit: Payer: Self-pay

## 2019-04-30 DIAGNOSIS — I700 Atherosclerosis of aorta: Secondary | ICD-10-CM | POA: Insufficient documentation

## 2019-04-30 DIAGNOSIS — I2510 Atherosclerotic heart disease of native coronary artery without angina pectoris: Secondary | ICD-10-CM | POA: Insufficient documentation

## 2019-04-30 DIAGNOSIS — Z1231 Encounter for screening mammogram for malignant neoplasm of breast: Secondary | ICD-10-CM | POA: Diagnosis not present

## 2019-04-30 DIAGNOSIS — J439 Emphysema, unspecified: Secondary | ICD-10-CM | POA: Insufficient documentation

## 2019-04-30 DIAGNOSIS — Z122 Encounter for screening for malignant neoplasm of respiratory organs: Secondary | ICD-10-CM | POA: Diagnosis not present

## 2019-04-30 DIAGNOSIS — F17210 Nicotine dependence, cigarettes, uncomplicated: Secondary | ICD-10-CM | POA: Diagnosis not present

## 2019-05-02 ENCOUNTER — Other Ambulatory Visit: Payer: Self-pay | Admitting: Family Medicine

## 2019-05-02 DIAGNOSIS — R931 Abnormal findings on diagnostic imaging of heart and coronary circulation: Secondary | ICD-10-CM

## 2019-05-12 ENCOUNTER — Ambulatory Visit: Payer: PPO | Admitting: Family Medicine

## 2019-05-16 ENCOUNTER — Other Ambulatory Visit: Payer: Self-pay | Admitting: Family Medicine

## 2019-05-19 ENCOUNTER — Other Ambulatory Visit: Payer: Self-pay

## 2019-05-19 ENCOUNTER — Encounter: Payer: Self-pay | Admitting: Gastroenterology

## 2019-05-19 ENCOUNTER — Ambulatory Visit (INDEPENDENT_AMBULATORY_CARE_PROVIDER_SITE_OTHER): Payer: PPO | Admitting: Gastroenterology

## 2019-05-19 VITALS — BP 123/72 | HR 88 | Temp 97.5°F | Ht 62.0 in | Wt 104.0 lb

## 2019-05-19 DIAGNOSIS — R1032 Left lower quadrant pain: Secondary | ICD-10-CM

## 2019-05-19 DIAGNOSIS — D126 Benign neoplasm of colon, unspecified: Secondary | ICD-10-CM | POA: Diagnosis not present

## 2019-05-19 DIAGNOSIS — R1031 Right lower quadrant pain: Secondary | ICD-10-CM

## 2019-05-19 DIAGNOSIS — G8929 Other chronic pain: Secondary | ICD-10-CM | POA: Diagnosis not present

## 2019-05-19 MED ORDER — TRAMADOL HCL 50 MG PO TABS: 50.0000 mg | ORAL_TABLET | Freq: Four times a day (QID) | ORAL | 0 refills | Status: DC | PRN

## 2019-05-19 NOTE — Addendum Note (Signed)
Addended by: Danie Binder on: 05/19/2019 12:07 PM   Modules accepted: Orders

## 2019-05-19 NOTE — Patient Instructions (Addendum)
SUBMIT URINE SAMPLE TODAY.   COMPLETE CT SCAN WITHIN 7 DAYS.  SEE [facility] MAR 10 @ 230 PM, DR. PATRICK MACKENZIE.  USE TYLENOL OR ULTRAM WHEN NEEDED TO REDUCE YOUR LOWER ABDOMINAL PAIN.  FOLLOW UP IN 6 MOS AND WE WILL SCHEDULE YOU NEXT COLONOSCOPY.

## 2019-05-19 NOTE — Progress Notes (Signed)
   Subjective:    Patient ID: Carla [name], female    DOB: 1945-07-04, 74 y.o.   MRN: [id number]  Fayrene Helper, MD  HPI Pain in lower abdomen when she urinates.Marland Kitchen HAVING LOWER ABDOMINAL PAIN AND URINATING MAKES IT WORSE. SYMPTOMS FOR THEY PAST MONTH. NO URINARY RECENCY OR INCOMPLETE URINATION, A LITTLE BURNING WHEN SHE PEES. MILD R FLANK PAIN: SHARP, TRIES TO MOVE BUT DOESN'T. HAD UTIs IN THE PAST A WHILE AGO. USUALLY BOWELS MOVE: EVERY OTHER DAY AND MAY TAKE LAXATIVES TO MAKE THEM MOVE. APPETITE: NOT TOO GOOD. DOESN'T GET HUNGRY. BEEN EATING BUT LOSING [id number]: 135 LBS). ALWAYS GETS FULL FAST BUT ABLE EAT LESS NOW.  HEARTBURN:1-2X/MONTH. NOW SMOKING: 5-6 A DAY. NO SOB WHEN SHE WALKS FROM HERE TO HER CAR. JUN 2020 HAD CT WITH R HYDRONEPHROSIS. NO UROLOGY REFERRAL.  PT DENIES FEVER, CHILLS, HEMATOCHEZIA, HEMATEMESIS, nausea, vomiting, melena, diarrhea, CHEST PAIN, SHORTNESS OF BREATH, CHANGE IN BOWEL IN HABITS, problems swallowing, problems with sedation, OR heartburn or indigestion.  Past Medical History:  Diagnosis Date  . ASCVD (arteriosclerotic cardiovascular disease)    No critical disease on cath in 8/97: mild AI with trival, if any l AS; negative stress nuclear in 2010  . Back pain   . Cervical disc herniation   . Chronic bronchitis   . Colon polyps   . Depression   . Elevated hemoglobin A1c   . Fasting hyperglycemia   . GERD (gastroesophageal reflux disease)   . HSV (herpes simplex virus) infection   . Hydronephrosis   . Hypertension   . Osteoporosis   . Peripheral vascular disease ([place])    stauts post aortabifemoral graft    . Secondary erythrocytosis 08/05/2012   Secondary to COPD  . Tobacco abuse    has stopped   Past Surgical History:  Procedure Laterality Date  . ABDOMINAL HYSTERECTOMY    . aorta bifem graft    . CHOLECYSTECTOMY    . COLONOSCOPY  06/23/2006   Dr. Aundra Espin:Normal colon without evidence of polyps, masses, inflammatory changes/Normal  retroflex view of the rectum  . COLONOSCOPY N/A 08/24/2013   Procedure: COLONOSCOPY;  Surgeon: Danie Binder, MD;  [facility];  Service: Endoscopy;  Laterality: N/A;  10:30-moved to [place] notified pt  . lysis of adhesions  2000  . PARTIAL HYSTERECTOMY    . right kidney surgery following damage during arterial surgery    . TOTAL ABDOMINAL HYSTERECTOMY W/ BILATERAL SALPINGOOPHORECTOMY  1975     Review of Systems     Objective:   Physical Exam        Assessment & Plan:

## 2019-05-19 NOTE — Assessment & Plan Note (Addendum)
SYMPTOMS NOT IDEALLY CONTROLLED AND LIKELY DUE TO CYSTITIS ASSOCIATED WITH R HYDRONEPHROSIS IN JUN 2020.  SUBMIT URINE SAMPLE TODAY.  I PERSONALLY CALLED  TO OBTAIN UROLOGY APPT. SEE [facility] ASAP: MAR 10 @230PM . USE TYLENOL OR ULTRAM WHEN NEEDED TO REDUCE YOUR LOWER ABDOMINAL PAIN. REPEAT CT ABD/PELVIS W/O IVC. DISCUSSED WITH RADIOLOGY. FOLLOW UP IN 6 MOS.

## 2019-05-19 NOTE — Assessment & Plan Note (Signed)
NO WARNING SIGNS/SYMPTOMS.  FOLLOW UP IN 6 MOS AND WE WILL SCHEDULE YOUR NEXT COLONOSCOPY FOR COLON POLYPS.

## 2019-05-19 NOTE — Progress Notes (Signed)
Cc'ed to pcp °

## 2019-05-20 ENCOUNTER — Other Ambulatory Visit: Payer: Self-pay | Admitting: Family Medicine

## 2019-05-21 NOTE — Telephone Encounter (Signed)
Requested refills

## 2019-05-27 ENCOUNTER — Telehealth: Payer: Self-pay | Admitting: Family Medicine

## 2019-05-31 ENCOUNTER — Ambulatory Visit: Payer: PPO | Admitting: Family Medicine

## 2019-05-31 ENCOUNTER — Telehealth: Payer: Self-pay

## 2019-05-31 NOTE — Telephone Encounter (Signed)
Pt needs her Xanax and Hydrocodone called in --she is completely out

## 2019-05-31 NOTE — Telephone Encounter (Signed)
Per Dr Camillia Herter 1/27 note- hydrocodone and xanax dose was reduced and since she had lost weight and seemed over sedated she planned to discontinue. Husband came in asking for the refills since she was out. Please advise what I should tell them once you review her notes

## 2019-06-02 ENCOUNTER — Other Ambulatory Visit: Payer: Self-pay

## 2019-06-02 ENCOUNTER — Encounter: Payer: Self-pay | Admitting: Urology

## 2019-06-02 ENCOUNTER — Other Ambulatory Visit: Payer: Self-pay | Admitting: Family Medicine

## 2019-06-02 ENCOUNTER — Ambulatory Visit: Payer: PPO

## 2019-06-02 ENCOUNTER — Ambulatory Visit (INDEPENDENT_AMBULATORY_CARE_PROVIDER_SITE_OTHER): Payer: PPO | Admitting: Urology

## 2019-06-02 VITALS — BP 142/77 | HR 91 | Temp 98.0°F | Ht 62.0 in | Wt 110.0 lb

## 2019-06-02 DIAGNOSIS — N1330 Unspecified hydronephrosis: Secondary | ICD-10-CM | POA: Diagnosis not present

## 2019-06-02 DIAGNOSIS — Z0289 Encounter for other administrative examinations: Secondary | ICD-10-CM

## 2019-06-02 LAB — POCT URINALYSIS DIPSTICK
Glucose, UA: NEGATIVE
Ketones, UA: NEGATIVE
Nitrite, UA: NEGATIVE
Protein, UA: POSITIVE — AB
Spec Grav, UA: >=1.03 — AB (ref 1.010–1.025)
Urobilinogen, UA: NEGATIVE E.U./dL — AB
pH, UA: 5 (ref 5.0–8.0)

## 2019-06-02 LAB — BLADDER SCAN AMB NON-IMAGING: Scan Result: 13.4

## 2019-06-02 NOTE — Progress Notes (Signed)
Urological Symptom Review  Patient is experiencing the following symptoms: None   Review of Systems  Gastrointestinal (upper)  : Negative for upper GI symptoms  Gastrointestinal (lower) : Negative for lower GI symptoms  Constitutional : Weight loss  Skin: Negative for skin symptoms  Eyes: Negative for eye symptoms  Ear/Nose/Throat : Sinus problems  Hematologic/Lymphatic: Negative for Hematologic/Lymphatic symptoms  Cardiovascular : Negative for cardiovascular symptoms  Respiratory : Negative for respiratory symptoms  Endocrine: Negative for endocrine symptoms  Musculoskeletal: Negative for musculoskeletal symptoms  Neurological: Headaches  Psychologic: Anxiety

## 2019-06-02 NOTE — Patient Instructions (Signed)
Hydronephrosis  Hydronephrosis is the swelling of one or both kidneys due to a blockage that stops urine from flowing out of the body. Kidneys filter waste from the blood and produce urine. This condition can lead to kidney failure and may become life threatening if not treated promptly. What are the causes? Common causes of this condition include:  Problems that occur when a baby is developing in the womb (congenital defect). These can include problems: ? In the kidneys. ? In the tubes that drain urine from the kidneys into the bladder (ureters).  Kidney stones.  Bladder infection.  An enlarged prostate gland.  Scar tissue from a previous surgery or injury.  A blood clot.  A tumor or cyst in the abdomen or pelvis.  Cancer of the prostate, bladder, uterus, ovary, or colon. What are the signs or symptoms? Symptoms of this condition include:  Pain or discomfort in your side (flank).  Pain and swelling in your abdomen.  Nausea and vomiting.  Fever.  Pain when passing urine.  Feelings of urgency when you need to urinate.  Urinating more often than normal. In some cases, you may not have any symptoms. How is this diagnosed? This condition may be diagnosed based on:  Your symptoms and medical history.  A physical exam.  Blood and urine tests.  Imaging tests, such as an ultrasound, CT scan, or MRI.  A procedure in which a scope is inserted into the urethra and used to view parts of the urinary tract and bladder (cystoscopy). How is this treated? Treatment for this condition depends on where the blockage is, how long it has been there, and what caused it. The goal of treatment is to remove the blockage. Treatment may include:  Antibiotic medicines to treat or prevent infection.  A procedure to place a small, thin tube (stent) into a blocked ureter. The stent will keep the ureter open so that urine can drain through it.  A nonsurgical procedure that crushes kidney  stones with shock waves (extracorporeal shock wave lithotripsy).  If kidney failure occurs, treatment may include dialysis or a kidney transplant. Follow these instructions at home:   Take over-the-counter and prescription medicines only as told by your health care provider.  Rest and return to your normal activities as told by your health care provider. Ask your health care provider what activities are safe for you.  Drink enough fluid to keep your urine pale yellow.  If you were prescribed an antibiotic medicine, take it exactly as told by your health care provider. Do not stop taking the antibiotic even if you start to feel better.  Keep all follow-up visits as told by your health care provider. This is important. Contact a health care provider if:  You continue to have symptoms after treatment.  You develop new symptoms.  Your urine becomes cloudy or bloody.  You have a fever. Get help right away if:  You have severe flank or abdominal pain.  You cannot drink fluids without vomiting. Summary  Hydronephrosis is the swelling of one or both kidneys due to a blockage that stops urine from flowing out of the body.  Hydronephrosis can lead to kidney failure and may become life threatening if not treated promptly.  The goal of treatment is to treat the cause of the blockage. It may include insertion of stent into a blocked ureter, a procedure to treat kidney stones, and antibiotic medicines.  Follow your health care provider's instructions for taking care of yourself at   home, including instructions about drinking fluids, taking medicines, and limiting activities. This information is not intended to replace advice given to you by your health care provider. Make sure you discuss any questions you have with your health care provider. Document Revised: 03/22/2017 Document Reviewed: 03/22/2017 Elsevier Patient Education  2020 Elsevier Inc.  

## 2019-06-02 NOTE — Progress Notes (Signed)
06/02/2019 3:40 PM   Carla [name] 1946-02-25 [id number]  Referring provider: Fayrene Helper, MD [address], [place],  [place] [address]  hydronephrosis  HPI: Carla [name] is a 74yo here for evaluation of right hydronephrosis from a CT scan from 09/22/2018. No gross hematuria. 50pk year smoking hx. 20lb weight loss over 3 months. No fevers chills. No flank pain. UA today shows 2+ blood.    PMH: Past Medical History:  Diagnosis Date  . Anxiety   . ASCVD (arteriosclerotic cardiovascular disease)    No critical disease on cath in 8/97: mild AI with trival, if any l AS; negative stress nuclear in 2010  . Back pain   . Cervical disc herniation   . Chronic bronchitis   . Colon polyps   . Depression   . Elevated hemoglobin A1c   . Fasting hyperglycemia   . GERD (gastroesophageal reflux disease)   . Heart disease   . HSV (herpes simplex virus) infection   . Hydronephrosis   . Hypercholesteremia   . Hypertension   . Osteoporosis   . Peripheral vascular disease ([place])    stauts post aortabifemoral graft    . Secondary erythrocytosis 08/05/2012   Secondary to COPD  . Tobacco abuse    has stopped    Surgical History: Past Surgical History:  Procedure Laterality Date  . ABDOMINAL HYSTERECTOMY    . aorta bifem graft    . CHOLECYSTECTOMY    . COLONOSCOPY  06/23/2006   Dr. Fields:Normal colon without evidence of polyps, masses, inflammatory changes/Normal retroflex view of the rectum  . COLONOSCOPY N/A 08/24/2013   Procedure: COLONOSCOPY;  Surgeon: Danie Binder, MD;  [facility];  Service: Endoscopy;  Laterality: N/A;  10:30-moved to [place] notified pt  . lysis of adhesions  2000  . PARTIAL HYSTERECTOMY    . right kidney surgery following damage during arterial surgery    . TOTAL ABDOMINAL HYSTERECTOMY W/ BILATERAL SALPINGOOPHORECTOMY  1975    Home Medications:  Allergies as of 06/02/2019      Reactions   Aricept [donepezil Hcl]    diarrhea   Calcium-containing Compounds Other (See Comments)   Pt has hyperparathyroidism which results in hypercalcemia   Exelon [rivastigmine Tartrate] Rash      Medication List       Accurate as of June 02, 2019  3:40 PM. If you have any questions, ask your nurse or doctor.        alendronate 70 MG tablet Commonly known as: FOSAMAX TAKE 1 TABLET EACH WEEK 30 MIN BEFORE BREAKFAST WITH 8 OUNCES OF WATER FOR OSTEOPOROSIS. What changed: See the new instructions.   ALPRAZolam 0.25 MG tablet Commonly known as: XANAX Take 1 tablet (0.25 mg total) by mouth 2 (two) times daily as needed for anxiety.   amLODipine 10 MG tablet Commonly known as: NORVASC TAKE ONE TABLET BY MOUTH ONCE DAILY.   aspirin EC 81 MG tablet Take 81 mg by mouth daily.   gabapentin 100 MG capsule Commonly known as: NEURONTIN TAKE (2) CAPSULES BY MOUTH AT BEDTIME.   HYDROcodone-acetaminophen 5-325 MG tablet Commonly known as: NORCO/VICODIN Take one tablet by mouth two times daily for pain   hydrocortisone 2.5 % rectal cream Commonly known as: ANUSOL-HC APPLY SPARINGLY TO THE VAGINA AREA TWICE WEEKLY AS NEEDED FOR ITCHING.   montelukast 10 MG tablet Commonly known as: SINGULAIR TAKE (1) TABLET BY MOUTH AT BEDTIME.   multivitamin with minerals Tabs tablet Take  1 tablet by mouth every evening.   potassium chloride SA 20 MEQ tablet Commonly known as: KLOR-CON Take 1 tablet (20 mEq total) by mouth 2 (two) times daily.   rivastigmine 9.5 mg/24hr Commonly known as: EXELON APPLY 1 PATCH TO THE SKIN DAILY. DISCARD USED PATCH.   rosuvastatin 40 MG tablet Commonly known as: Crestor Take 1 tablet (40 mg total) by mouth daily.   saccharomyces boulardii 250 MG capsule Commonly known as: Florastor Take 1 capsule (250 mg total) by mouth 2 (two) times daily.   traMADol 50 MG tablet Commonly known as: ULTRAM Take 1 tablet (50 mg total) by mouth every 6 (six) hours as needed.       Allergies:    Allergies  Allergen Reactions  . Aricept [Donepezil Hcl]     diarrhea  . Calcium-Containing Compounds Other (See Comments)    Pt has hyperparathyroidism which results in hypercalcemia  . Exelon [Rivastigmine Tartrate] Rash    Family History: Family History  Problem Relation Age of Onset  . Heart attack Father   . COPD Sister   . Heart disease Sister   . Diabetes Sister   . Coronary artery disease Sister   . Liver cancer Sister 80  . Diabetes Sister   . Diabetes Sister   . COPD Sister   . Heart attack Mother   . Colon cancer Neg Hx     Social History:  reports that she has been smoking cigarettes. She has a 25.00 pack-year smoking history. She has never used smokeless tobacco. She reports that she does not drink alcohol or use drugs.  ROS: All other review of systems were reviewed and are negative except what is noted above in HPI  Physical Exam: BP (!) 142/77   Pulse 91   Temp 98 F (36.7 C)   Ht 5\' 2"  (1.575 m)   Wt 110 lb (49.9 kg)   BMI 20.12 kg/m   Constitutional:  Alert and oriented, No acute distress. HEENT: [place] AT, moist mucus membranes.  Trachea midline, no masses. Cardiovascular: No clubbing, cyanosis, or edema. Respiratory: Normal respiratory effort, no increased work of breathing. GI: Abdomen is soft, nontender, nondistended, no abdominal masses GU: No CVA tenderness Lymph: No cervical or inguinal lymphadenopathy. Skin: No rashes, bruises or suspicious lesions. Neurologic: Grossly intact, no focal deficits, moving all 4 extremities. Psychiatric: Normal mood and affect.  Laboratory Data: Lab Results  Component Value Date   WBC 8.1 04/21/2019   HGB 17.1 (H) 04/21/2019   HCT 51.2 (H) 04/21/2019   MCV 92.6 04/21/2019   PLT 315 04/21/2019    Lab Results  Component Value Date   CREATININE 0.71 04/21/2019    No results found for: PSA  No results found for: TESTOSTERONE  Lab Results  Component Value Date   HGBA1C 5.8 (H) 04/21/2019     Urinalysis    Component Value Date/Time   COLORURINE YELLOW 09/22/2018 1242   APPEARANCEUR CLOUDY (A) 09/22/2018 1242   LABSPEC 1.010 09/22/2018 1242   PHURINE 5.0 09/22/2018 1242   GLUCOSEU NEGATIVE 09/22/2018 1242   HGBUR MODERATE (A) 09/22/2018 1242   BILIRUBINUR NEGATIVE 09/22/2018 1242   BILIRUBINUR neg 09/10/2017 1007   KETONESUR NEGATIVE 09/22/2018 1242   PROTEINUR 30 (A) 09/22/2018 1242   UROBILINOGEN 0.2 09/10/2017 1007   UROBILINOGEN 0.2 07/07/2013 1550   NITRITE POSITIVE (A) 09/22/2018 1242   LEUKOCYTESUR MODERATE (A) 09/22/2018 1242    Lab Results  Component Value Date   BACTERIA MANY (A) 09/22/2018  Pertinent Imaging: CT stone study 09/22/2018: Images reviewed and discussed with patient No results found for this or any previous visit. No results found for this or any previous visit. No results found for this or any previous visit. No results found for this or any previous visit. No results found for this or any previous visit. No results found for this or any previous visit. No results found for this or any previous visit. Results for orders placed during the hospital encounter of 09/22/18  CT Renal Stone Study   Narrative CLINICAL DATA:  Flank pain, hematuria  EXAM: CT ABDOMEN AND PELVIS WITHOUT CONTRAST  TECHNIQUE: Multidetector CT imaging of the abdomen and pelvis was performed following the standard protocol without IV contrast.  COMPARISON:  11/01/2017, 07/07/2013  FINDINGS: Lower chest: No acute abnormality.  Coronary artery calcifications.  Hepatobiliary: No focal liver abnormality is seen. Status post cholecystectomy. Postoperative biliary ductal dilatation.  Pancreas: Unremarkable. No pancreatic ductal dilatation or surrounding inflammatory changes.  Spleen: Normal in size without significant abnormality.  Adrenals/Urinary Tract: Adrenal glands are unremarkable. Redemonstrated severe right hydronephrosis and proximal  hydroureter, with focal narrowing and return to normal caliber at the level of the iliac vessels. Bladder is unremarkable.  Stomach/Bowel: Stomach is within normal limits. Large burden of stool in the colon. Sigmoid diverticulosis. There is mild fat stranding about the distal sigmoid (series 2, image 61, series 5, image 83).  Vascular/Lymphatic: Aortic atherosclerosis with aortobifemoral bypass graft repair. No enlarged abdominal or pelvic lymph nodes.  Reproductive: Status post hysterectomy.  Other: No abdominal wall hernia or abnormality. No abdominopelvic ascites.  Musculoskeletal: No acute or significant osseous findings.  IMPRESSION: 1. Redemonstrated severe right hydronephrosis and proximal hydroureter, with focal narrowing and return to normal caliber at the level of the iliac vessels. This may reflect chronic stricture. There is no evidence of urinary tract calculus. Ureteral stricture may be further evaluated by nuclear scintigraphic Lasix renogram if desired.  2. Large burden of stool in the colon. Sigmoid diverticulosis. There is mild fat stranding about the distal sigmoid (series 2, image 61, series 5, image 83), suggestive of acute diverticulitis. No evidence of perforation or abscess.  3. Other chronic, incidental, and postoperative findings, including aortobifemoral bypass graft, as detailed above.   Electronically Signed   By: Eddie Candle M.D.   On: 09/22/2018 16:42     Assessment & Plan:    1. Hydronephrosis of right kidney -We discussed the natural hx of hydronephrosis and the various causes including stricture, malignancy, calculus. We iscussed the workup including right diagnostic ureteroscopy and the patient wishes to proceed. Risks/benefits/alterantives discussed - Bladder Scan (Post Void Residual) in office - POCT urinalysis dipstick   No follow-ups on file.  Nicolette Bang, MD  [facility] [place]

## 2019-06-02 NOTE — Telephone Encounter (Signed)
Spoke with patients husband and he said that if she has to be referred to pain management then he is ok with it but right now she needs her meds because he can tell she is in pain and she is up and down all hours of the night and he said he was here at the last appt and she was not oversedated that she has dementia and that's why she was acting the way she was. Please advise. He seemed to be getting frustrated with me

## 2019-06-02 NOTE — Telephone Encounter (Signed)
Urine submitted. Needs appt made in the next week or two

## 2019-06-03 ENCOUNTER — Other Ambulatory Visit (HOSPITAL_COMMUNITY)
Admission: RE | Admit: 2019-06-03 | Discharge: 2019-06-03 | Disposition: A | Discharge status: Home / Self Care | Payer: PPO | Attending: *Deleted | Admitting: *Deleted

## 2019-06-03 ENCOUNTER — Ambulatory Visit (HOSPITAL_COMMUNITY)
Admission: RE | Admit: 2019-06-03 | Discharge: 2019-06-03 | Disposition: A | Discharge status: Home / Self Care | Payer: PPO | Source: Ambulatory Visit | Attending: Gastroenterology | Admitting: Gastroenterology

## 2019-06-03 DIAGNOSIS — R1032 Left lower quadrant pain: Secondary | ICD-10-CM | POA: Diagnosis not present

## 2019-06-03 DIAGNOSIS — G8929 Other chronic pain: Secondary | ICD-10-CM | POA: Diagnosis not present

## 2019-06-03 DIAGNOSIS — R1031 Right lower quadrant pain: Secondary | ICD-10-CM | POA: Diagnosis not present

## 2019-06-03 DIAGNOSIS — Z0289 Encounter for other administrative examinations: Secondary | ICD-10-CM | POA: Diagnosis not present

## 2019-06-03 DIAGNOSIS — N200 Calculus of kidney: Secondary | ICD-10-CM | POA: Diagnosis not present

## 2019-06-03 DIAGNOSIS — K5730 Diverticulosis of large intestine without perforation or abscess without bleeding: Secondary | ICD-10-CM | POA: Diagnosis not present

## 2019-06-07 LAB — MISC LABCORP TEST (SEND OUT): Labcorp test code: 738526

## 2019-06-08 ENCOUNTER — Telehealth: Payer: Self-pay | Admitting: Gastroenterology

## 2019-06-08 MED ORDER — LINACLOTIDE 72 MCG PO CAPS: 72.0000 ug | ORAL_CAPSULE | Freq: Every day | ORAL | 11 refills | Status: DC

## 2019-06-08 NOTE — Telephone Encounter (Signed)
PLEASE CALL PT. THE RIGHT KIDNEY FINDINGS IS RESOLVED. SHE HAS SMALL LEFT KIDNEY STONES. SHE HAS CONSTIPATION. HER LOWER ABDOMINAL PAIN IS MOST LIKELY DUE TO CONSTIPATION. NO SOURCE FOR HER WEIGHT LOSS WAS IDENTIFIED.  SHE SHOULD:    1. TO REDUCE CONSTIPATION, USE LINZESS 30 MINS PRIOR TO BREAKFAST. IT MAY CAUSE EXPLOSIVE DIARRHEA.    2. PROCEED WITH UPPER ENDOSCOPY TO EVALUATE LOSS OF WEIGHT AND APPETITE.

## 2019-06-09 NOTE — Telephone Encounter (Signed)
Lmom for pt to call us back. 

## 2019-06-09 NOTE — Telephone Encounter (Signed)
Pt is scheduled to come in the office 3-24 @ 1pm

## 2019-06-09 NOTE — Telephone Encounter (Signed)
Called pt and informed her that the right kidney findings is resolved.  She was informed that she has small left kidney stones.  She was made aware that her lower abdominal pain is most likely due to constipation.  She was advised to use Linzess 30 minutes prior to breakfast.  She was made aware that it may cause explosive diarrhea.  She was advised to proceed with upper endoscopy to evaluate loss of wt and appetite.  Pt voiced understanding.

## 2019-06-09 NOTE — Telephone Encounter (Signed)
Lmom for pt to call me back. 

## 2019-06-11 NOTE — Telephone Encounter (Signed)
Medication refills to be addressed at follow up visit, Attempt to taper down and off of pain and anxiety medcations

## 2019-06-15 ENCOUNTER — Ambulatory Visit: Payer: PPO | Admitting: Family Medicine

## 2019-06-16 ENCOUNTER — Ambulatory Visit (INDEPENDENT_AMBULATORY_CARE_PROVIDER_SITE_OTHER): Payer: PPO | Admitting: Family Medicine

## 2019-06-16 ENCOUNTER — Other Ambulatory Visit: Payer: Self-pay

## 2019-06-16 ENCOUNTER — Encounter: Payer: Self-pay | Admitting: Family Medicine

## 2019-06-16 VITALS — BP 138/64 | HR 100 | Temp 96.9°F | Ht 62.0 in | Wt 102.0 lb

## 2019-06-16 DIAGNOSIS — G8929 Other chronic pain: Secondary | ICD-10-CM | POA: Diagnosis not present

## 2019-06-16 DIAGNOSIS — G3184 Mild cognitive impairment, so stated: Secondary | ICD-10-CM

## 2019-06-16 DIAGNOSIS — F410 Panic disorder [episodic paroxysmal anxiety] without agoraphobia: Secondary | ICD-10-CM | POA: Diagnosis not present

## 2019-06-16 DIAGNOSIS — E559 Vitamin D deficiency, unspecified: Secondary | ICD-10-CM

## 2019-06-16 LAB — VITAMIN D 25 HYDROXY (VIT D DEFICIENCY, FRACTURES): Vit D, 25-Hydroxy: 43 ng/mL (ref 30–100)

## 2019-06-16 LAB — VITAMIN B12: Vitamin B-12: 855 pg/mL (ref 200–1100)

## 2019-06-16 MED ORDER — HYDROCODONE-ACETAMINOPHEN 5-325 MG PO TABS: ORAL_TABLET | ORAL | 0 refills | Status: DC

## 2019-06-16 MED ORDER — MEMANTINE HCL 5 MG PO TABS: 5.0000 mg | ORAL_TABLET | Freq: Every day | ORAL | 1 refills | Status: DC

## 2019-06-16 MED ORDER — ALPRAZOLAM 0.25 MG PO TABS: 0.2500 mg | ORAL_TABLET | Freq: Every evening | ORAL | 0 refills | Status: DC | PRN

## 2019-06-16 MED ORDER — ALPRAZOLAM 0.25 MG PO TABS: 0.2500 mg | ORAL_TABLET | Freq: Two times a day (BID) | ORAL | 0 refills | Status: DC | PRN

## 2019-06-16 NOTE — Assessment & Plan Note (Signed)
Needs updated labs.

## 2019-06-16 NOTE — Assessment & Plan Note (Signed)
Refill at reduced doses will look to d/c in near future and possibly refer to pain management

## 2019-06-16 NOTE — Progress Notes (Signed)
Subjective:  Patient ID: Carla [name], female    DOB: [birth year]  Age: 74 y.o. MRN: [id number]  CC:  Chief Complaint  Patient presents with  . pain management    review UDS and MMSE due   . Hip Pain    right hip pain flared up lastnight       HPI  HPI  [name] is a 74 year old female patient who presents today for pain management, memory assessment.  At previous appointment with Dr. Moshe Cipro her medicine was reduced as she was started to have oversedation.  She is here for follow-up today to get refills her urine drug screen was consistent with the current medication she was prescribed by Dr. Oneida Alar which was Ultram.  She understands a reduction in her medicine.  Just reports that she is having some right hip pain that flared up last night.  She reports the pain comes and goes.  Described as aching.  Aggravated by sitting.  Relieved by medication.  Intermittently worse at times.  Described as a 9 out of 10 pain,  does not want any injections today.  Just would like a refill of her medications.    She is alert she is oriented and she is able to converse appropriately.  However she does repeat questions and seems to be a little bit confused on medications.  MMSE has shown a decline.    Denies having falls.  Reports some memory changes.  Denies having any skin issues rashes or wounds.  She reports eating well.  Does have a problem with malnutrition.  Reports that she could drink more water.  Reports sleeping well for the most part.  Reports that she continues to have trouble having bowel movements but that is nothing new for her.  Denies having any trouble with bladder changes.  Today patient denies signs and symptoms of COVID 19 infection including fever, chills, cough, shortness of breath, and headache. Past Medical, Surgical, Social History, Allergies, and Medications have been Reviewed.   Past Medical History:  Diagnosis Date  . Anxiety   . ASCVD (arteriosclerotic  cardiovascular disease)    No critical disease on cath in 8/97: mild AI with trival, if any l AS; negative stress nuclear in 2010  . Atrophic vaginitis 04/18/2016  . Back pain   . Cervical disc herniation   . Chronic bronchitis   . Colon polyps   . Depression   . Elevated hemoglobin A1c   . Fasting hyperglycemia   . GERD (gastroesophageal reflux disease)   . Heart disease   . HSV (herpes simplex virus) infection   . Hydronephrosis   . Hypercalcemia 12/30/2008   Qualifier: Diagnosis of  By: Cori Razor LPN, Brandi    . Hypercholesteremia   . Hypertension   . Neck muscle spasm 04/23/2014  . Osteoporosis   . Peripheral vascular disease ([place])    stauts post aortabifemoral graft    . Secondary erythrocytosis 08/05/2012   Secondary to COPD  . Tobacco abuse    has stopped  . 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 alendronate (FOSAMAX) 70 MG tablet TAKE 1 TABLET EACH WEEK 30 MIN BEFORE BREAKFAST WITH 8 OUNCES OF WATER FOR OSTEOPOROSIS. (Patient taking differently: Take 70 mg by mouth once a week. )  . amLODipine (NORVASC) 10 MG tablet TAKE ONE TABLET BY MOUTH ONCE DAILY.  Marland Kitchen aspirin EC 81 MG tablet Take 81 mg by mouth daily.  Marland Kitchen  gabapentin (NEURONTIN) 100 MG capsule TAKE (2) CAPSULES BY MOUTH AT BEDTIME.  . hydrocortisone (ANUSOL-HC) 2.5 % rectal cream APPLY SPARINGLY TO THE VAGINA AREA TWICE WEEKLY AS NEEDED FOR ITCHING.  . linaclotide (LINZESS) 72 MCG capsule Take 1 capsule (72 mcg total) by mouth daily before breakfast.  . montelukast (SINGULAIR) 10 MG tablet TAKE (1) TABLET BY MOUTH AT BEDTIME.  . Multiple Vitamin (MULTIVITAMIN WITH MINERALS) TABS Take 1 tablet by mouth every evening.   . potassium chloride SA (KLOR-CON) 20 MEQ tablet Take 1 tablet (20 mEq total) by mouth 2 (two) times daily.  . rosuvastatin (CRESTOR) 40 MG tablet Take 1 tablet (40 mg total) by mouth daily.  Marland Kitchen saccharomyces boulardii (FLORASTOR) 250 MG capsule Take 1 capsule (250 mg total) by  mouth 2 (two) times daily.  . traMADol (ULTRAM) 50 MG tablet Take 1 tablet (50 mg total) by mouth every 6 (six) hours as needed.  . [DISCONTINUED] rivastigmine (EXELON) 9.5 mg/24hr APPLY 1 PATCH TO THE SKIN DAILY. DISCARD USED PATCH.    ROS:  Review of Systems  HENT: Negative.   Eyes: Negative.   Respiratory: Negative.   Cardiovascular: Negative.   Gastrointestinal: Negative.   Genitourinary: Negative.   Musculoskeletal: Positive for joint pain.  Skin: Negative.   Neurological: Negative.   Endo/Heme/Allergies: Negative.   Psychiatric/Behavioral: Negative.   All other systems reviewed and are negative.    Objective:   Today's Vitals: BP 138/64   Pulse 100   Temp (!) 96.9 F (36.1 C) (Temporal)   Ht 5\' 2"  (1.575 m)   Wt 102 lb (46.3 kg)   SpO2 93%   BMI 18.66 kg/m  Vitals with BMI 06/16/2019 06/02/2019 05/19/2019  Height 5\' 2"  5\' 2"  5\' 2"   Weight 102 lbs 110 lbs 104 lbs  BMI 18.65 0000000 [id number]  Systolic 0000000 [id number]  Diastolic 64 77 72  Pulse [id number] 91 88     Physical Exam Vitals and nursing note reviewed.  Constitutional:      Appearance: Normal appearance. She is well-developed and well-groomed. She is obese.  HENT:     Head: Normocephalic and atraumatic.     Right Ear: External ear normal.     Left Ear: External ear normal.     Mouth/Throat:     Comments: Mask in place  Eyes:     General:        Right eye: No discharge.        Left eye: No discharge.     Conjunctiva/sclera: Conjunctivae normal.  Cardiovascular:     Rate and Rhythm: Normal rate and regular rhythm.     Pulses: Normal pulses.     Heart sounds: Normal heart sounds.  Pulmonary:     Effort: Pulmonary effort is normal.     Breath sounds: Normal breath sounds.  Musculoskeletal:        General: Normal range of motion.     Cervical back: Normal range of motion and neck supple.  Skin:    General: Skin is warm.  Neurological:     General: No focal deficit present.     Mental Status: She is alert  and oriented to person, place, and time.  Psychiatric:        Attention and Perception: Attention normal.        Mood and Affect: Mood normal.        Speech: Speech normal.        Behavior: Behavior normal. Behavior is cooperative.  Thought Content: Thought content normal.        Cognition and Memory: Cognition normal.        Judgment: Judgment normal.       MMSE - Mini Mental State Exam 06/16/2019 01/23/2017 10/30/2015  Orientation to time 2 2 4   Orientation to Place 2 5 5   Registration 3 3 3   Attention/ Calculation 4 5 5   Recall 0 0 0  Language- name 2 objects 2 2 2   Language- repeat 1 1 1   Language- follow 3 step command 3 3 3   Language- read & follow direction 1 1 1   Write a sentence 1 1 1   Copy design 0 0 1  Total score 19 23 26      Assessment   1. MCI (mild cognitive impairment) with memory loss   2. Encounter for chronic pain management   3. Anxiety attack   4. Vitamin D deficiency     Tests ordered Orders Placed This Encounter  Procedures  . VITAMIN D 25 Hydroxy (Vit-D Deficiency, Fractures)  . Vitamin B12     Plan: Please see assessment and plan per problem list above.   Meds ordered this encounter  Medications  . DISCONTD: HYDROcodone-acetaminophen (NORCO/VICODIN) 5-325 MG tablet    Sig: Take one tablet by mouth two times daily for pain    Dispense:  60 tablet    Refill:  0    Reduced dose effective 04/21/2019    Order Specific Question:   Supervising Provider    Answer:   Tula Nakayama E [id number]  . DISCONTD: ALPRAZolam (XANAX) 0.25 MG tablet    Sig: Take 1 tablet (0.25 mg total) by mouth 2 (two) times daily as needed for anxiety.    Dispense:  60 tablet    Refill:  0    Order Specific Question:   Supervising Provider    Answer:   SIMPSON, MARGARET E [id number]  . memantine (NAMENDA) 5 MG tablet    Sig: Take 1 tablet (5 mg total) by mouth daily.    Dispense:  30 tablet    Refill:  1    Order Specific Question:   Supervising Provider     Answer:   SIMPSON, MARGARET E [id number]  . ALPRAZolam (XANAX) 0.25 MG tablet    Sig: Take 1 tablet (0.25 mg total) by mouth at bedtime as needed for anxiety.    Dispense:  30 tablet    Refill:  0    Dose reduction effective 06/16/2019  . HYDROcodone-acetaminophen (NORCO/VICODIN) 5-325 MG tablet    Sig: Take one tablet by mouth daily for pain    Dispense:  30 tablet    Refill:  0    Reduced dose effective 06/16/2019    Patient to follow-up in 8 weeks repeat MMSE .  Perlie Mayo, NP

## 2019-06-16 NOTE — Patient Instructions (Addendum)
I appreciate the opportunity to provide you with care for your health and wellness. Today we discussed: memory and pain  Follow up: 8 weeks for repeat MMSE  Labs today  No referrals  Start taking Namenda medication 5 mg daily.  Please continue to practice social distancing to keep you, your family, and our community safe.  If you must go out, please wear a mask and practice good handwashing.  It was a pleasure to see you and I look forward to continuing to work together on your health and well-being. Please do not hesitate to call the office if you need care or have questions about your care.  Have a wonderful day and week. With Gratitude, Cherly Beach, DNP, AGNP-BC   Memory Compensation Strategies  1. Use "WARM" strategy.  W= write it down  A= associate it  R= repeat it  M= make a mental note  2.   You can keep a Social worker.  Use a 3-ring notebook with sections for the following: calendar, important names and phone numbers,  medications, doctors' names/phone numbers, lists/reminders, and a section to journal what you did  each day.   3.    Use a calendar to write appointments down.  4.    Write yourself a schedule for the day.  This can be placed on the calendar or in a separate section of the Memory Notebook.  Keeping a  regular schedule can help memory.  5.    Use medication organizer with sections for each day or morning/evening pills.  You may need help loading it  6.    Keep a basket, or pegboard by the door.  Place items that you need to take out with you in the basket or on the pegboard.  You may also want to  include a message board for reminders.  7.    Use sticky notes.  Place sticky notes with reminders in a place where the task is performed.  For example: " turn off the  stove" placed by the stove, "lock the door" placed on the door at eye level, " take your medications" on  the bathroom mirror or by the place where you normally take your  medications.  8.    Use alarms/timers.  Use while cooking to remind yourself to check on food or as a reminder to take your medicine, or as a  reminder to make a call, or as a reminder to perform another task, etc.

## 2019-06-16 NOTE — Assessment & Plan Note (Signed)
MMSE 19/30 today. Starting Namenda Encouraged puzzles and other memory strategies. Follow up in 8 weeks for repeat MMSE  Also checking vitamin D and B 12 levels

## 2019-06-16 NOTE — Assessment & Plan Note (Signed)
Refill of Xanax provided, will look at dose reduction in future.

## 2019-06-23 ENCOUNTER — Telehealth: Payer: Self-pay

## 2019-06-23 NOTE — Telephone Encounter (Signed)
I spoke with Husband who verified pt had returned the calls from [facility] OP scheduling. Per Hoyle Sauer yesterday 06/22/19 pt had not returned any calls regarding preop. Husband states he spoke with someone last yesterday afternoon.

## 2019-07-01 ENCOUNTER — Telehealth: Payer: Self-pay

## 2019-07-01 NOTE — Telephone Encounter (Signed)
Pt is calling states that the Xanax is not doing her any good.  She states that this one is white, the other ones was peach color and they worked.

## 2019-07-02 ENCOUNTER — Other Ambulatory Visit: Payer: Self-pay | Admitting: Gastroenterology

## 2019-07-02 NOTE — Telephone Encounter (Signed)
Her xanax was decreased as well as her pain med so this is why they are different.

## 2019-07-12 ENCOUNTER — Ambulatory Visit: Payer: PPO | Admitting: Cardiology

## 2019-07-12 ENCOUNTER — Encounter: Payer: Self-pay | Admitting: Cardiology

## 2019-07-12 VITALS — BP 120/78 | HR 77 | Temp 97.0°F | Ht 62.0 in | Wt 100.0 lb

## 2019-07-12 DIAGNOSIS — I2510 Atherosclerotic heart disease of native coronary artery without angina pectoris: Secondary | ICD-10-CM | POA: Diagnosis not present

## 2019-07-12 DIAGNOSIS — I739 Peripheral vascular disease, unspecified: Secondary | ICD-10-CM

## 2019-07-12 DIAGNOSIS — I351 Nonrheumatic aortic (valve) insufficiency: Secondary | ICD-10-CM | POA: Diagnosis not present

## 2019-07-12 NOTE — Progress Notes (Signed)
Clinical Summary Carla [name] is a 74 y.o.female seen today as a new patient for the following medical problems. Last seen as an inpatient consult in 05/2015  1. History of chest pain - Cath 1997 nonobstructive CAD - 2012 normal stress echo - 05/2015 admit nuclear stress no ischemia  - 2016 CT chest multivessel CAD - 04/2019 CT chest 3 vessel CAD  - no recent chest pain - no SOB/DOE - walks regularly, up to 20 minutes. No exertional symptoms    2. COPD   3. PAD with prior aortobifem bypass - no recent symptoms   4. Hyperlipidemia - TC 160 HDL 62 TG 191 LDL 72   5. Aortic regurgitation - moderate by 2017 echo - no symptoms Past Medical History:  Diagnosis Date  . Anxiety   . ASCVD (arteriosclerotic cardiovascular disease)    No critical disease on cath in 8/97: mild AI with trival, if any l AS; negative stress nuclear in 2010  . Atrophic vaginitis 04/18/2016  . Back pain   . Cervical disc herniation   . Chronic bronchitis   . Colon polyps   . Depression   . Elevated hemoglobin A1c   . Fasting hyperglycemia   . GERD (gastroesophageal reflux disease)   . Heart disease   . HSV (herpes simplex virus) infection   . Hydronephrosis   . Hypercalcemia 12/30/2008   Qualifier: Diagnosis of  By: Cori Razor LPN, Brandi    . Hypercholesteremia   . Hypertension   . Neck muscle spasm 04/23/2014  . Osteoporosis   . Peripheral vascular disease ([place])    stauts post aortabifemoral graft    . Secondary erythrocytosis 08/05/2012   Secondary to COPD  . Tobacco abuse    has stopped  . 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 Aricept [Donepezil Hcl]     diarrhea  . Calcium-Containing Compounds Other (See Comments)    Pt has hyperparathyroidism which results in hypercalcemia  . Exelon [Rivastigmine Tartrate] Rash     Current Outpatient Medications  Medication Sig Dispense Refill  . alendronate (FOSAMAX) 70 MG tablet TAKE 1 TABLET EACH WEEK  30 MIN BEFORE BREAKFAST WITH 8 OUNCES OF WATER FOR OSTEOPOROSIS. (Patient taking differently: Take 70 mg by mouth once a week. ) 4 tablet 4  . ALPRAZolam (XANAX) 0.25 MG tablet Take 1 tablet (0.25 mg total) by mouth at bedtime as needed for anxiety. 30 tablet 0  . amLODipine (NORVASC) 10 MG tablet TAKE ONE TABLET BY MOUTH ONCE DAILY. 90 tablet 0  . aspirin EC 81 MG tablet Take 81 mg by mouth daily.    Marland Kitchen gabapentin (NEURONTIN) 100 MG capsule TAKE (2) CAPSULES BY MOUTH AT BEDTIME. 60 capsule 0  . HYDROcodone-acetaminophen (NORCO/VICODIN) 5-325 MG tablet Take one tablet by mouth daily for pain 30 tablet 0  . hydrocortisone (ANUSOL-HC) 2.5 % rectal cream APPLY SPARINGLY TO THE VAGINA AREA TWICE WEEKLY AS NEEDED FOR ITCHING. 30 g 0  . linaclotide (LINZESS) 72 MCG capsule Take 1 capsule (72 mcg total) by mouth daily before breakfast. 30 capsule 11  . memantine (NAMENDA) 5 MG tablet Take 1 tablet (5 mg total) by mouth daily. 30 tablet 1  . montelukast (SINGULAIR) 10 MG tablet TAKE (1) TABLET BY MOUTH AT BEDTIME. 30 tablet 0  . Multiple Vitamin (MULTIVITAMIN WITH MINERALS) TABS Take 1 tablet by mouth every evening.     . potassium chloride SA (KLOR-CON) 20 MEQ tablet Take 1  tablet (20 mEq total) by mouth 2 (two) times daily. 14 tablet 0  . rosuvastatin (CRESTOR) 40 MG tablet Take 1 tablet (40 mg total) by mouth daily. 90 tablet 3  . saccharomyces boulardii (FLORASTOR) 250 MG capsule Take 1 capsule (250 mg total) by mouth 2 (two) times daily. 30 capsule 0  . traMADol (ULTRAM) 50 MG tablet Take 1 tablet (50 mg total) by mouth every 6 (six) hours as needed. 20 tablet 0   No current facility-administered medications for this visit.     Past Surgical History:  Procedure Laterality Date  . ABDOMINAL HYSTERECTOMY    . aorta bifem graft    . CHOLECYSTECTOMY    . COLONOSCOPY  06/23/2006   Dr. Fields:Normal colon without evidence of polyps, masses, inflammatory changes/Normal retroflex view of the rectum  .  COLONOSCOPY N/A 08/24/2013   Procedure: COLONOSCOPY;  Surgeon: Danie Binder, MD;  Location: [facility];  Service: Endoscopy;  Laterality: N/A;  10:30-moved to [place] notified pt  . lysis of adhesions  2000  . PARTIAL HYSTERECTOMY    . right kidney surgery following damage during arterial surgery    . TOTAL ABDOMINAL HYSTERECTOMY W/ BILATERAL SALPINGOOPHORECTOMY  1975     Allergies  Allergen Reactions  . Aricept [Donepezil Hcl]     diarrhea  . Calcium-Containing Compounds Other (See Comments)    Pt has hyperparathyroidism which results in hypercalcemia  . Exelon [Rivastigmine Tartrate] Rash      Family History  Problem Relation Age of Onset  . Heart attack Father   . COPD Sister   . Heart disease Sister   . Diabetes Sister   . Coronary artery disease Sister   . Liver cancer Sister 10  . Diabetes Sister   . Diabetes Sister   . COPD Sister   . Heart attack Mother   . Colon cancer Neg Hx      Social History Carla [name] reports that she has been smoking cigarettes. She has a 25.00 pack-year smoking history. She has never used smokeless tobacco. Carla [name] reports no history of alcohol use.   Review of Systems CONSTITUTIONAL: No weight loss, fever, chills, weakness or fatigue.  HEENT: Eyes: No visual loss, blurred vision, double vision or yellow sclerae.No hearing loss, sneezing, congestion, runny nose or sore throat.  SKIN: No rash or itching.  CARDIOVASCULAR: per hpi RESPIRATORY: No shortness of breath, cough or sputum.  GASTROINTESTINAL: No anorexia, nausea, vomiting or diarrhea. No abdominal pain or blood.  GENITOURINARY: No burning on urination, no polyuria NEUROLOGICAL: No headache, dizziness, syncope, paralysis, ataxia, numbness or tingling in the extremities. No change in bowel or bladder control.  MUSCULOSKELETAL: No muscle, back pain, joint pain or stiffness.  LYMPHATICS: No enlarged nodes. No history of splenectomy.  PSYCHIATRIC: No history of  depression or anxiety.  ENDOCRINOLOGIC: No reports of sweating, cold or heat intolerance. No polyuria or polydipsia.  Marland Kitchen   Physical Examination Today's Vitals   07/12/19 1050 07/12/19 1055  BP: 126/62 120/78  Pulse: 77   Temp: (!) 97 F (36.1 C)   SpO2: 97%   Weight: 100 lb (45.4 kg)   Height: 5\' 2"  (1.575 m)    Body mass index is 18.29 kg/m.  Gen: resting comfortably, no acute distress HEENT: no scleral icterus, pupils equal round and reactive, no palptable cervical adenopathy,  CV: RRR, no m/r/g, no jvd Resp: Clear to auscultation bilaterally GI: abdomen is soft, non-tender, non-distended, normal bowel sounds, no hepatosplenomegaly MSK: extremities  are warm, no edema.  Skin: warm, no rash Neuro:  no focal deficits Psych: appropriate affect   Diagnostic Studies  05/2015 echo  Study Conclusions   - Left ventricle: The cavity size was normal. Wall thickness was  increased in a pattern of mild LVH. Systolic function was  vigorous. The estimated ejection fraction was in the range of 65%  to 70%. Wall motion was normal; there were no regional wall  motion abnormalities. Doppler parameters are consistent with  abnormal left ventricular relaxation (grade 1 diastolic  dysfunction).  - Aortic valve: Mildly calcified annulus. Trileaflet; mildly  thickened leaflets. There was moderate regurgitation. Valve area  (VTI): 2.89 cm^2. Valve area (Vmax): 2.81 cm^2. Valve area  (Vmean): 2.86 cm^2. Regurgitation pressure half-time: 304 ms.  - Technically adequate study.    Assessment and Plan   1. CAD - noted over the last several years by imaging, no functional ischemia by 2017 stress test - no symptoms to suggest obstructive disease - continue medical therapy. No indication for repeat ischemic testing at this time  2. PAD - continue medical therapy, no symptoms  3. Aortic regurgitation - moderate by 2017 echo, repeat echo  - no symptoms   Arnoldo Lenis, M.D.

## 2019-07-12 NOTE — Patient Instructions (Signed)
Medication Instructions:  Your physician recommends that you continue on your current medications as directed. Please refer to the Current Medication list given to you today.   Labwork: NONE  Testing/Procedures: Your physician has requested that you have an echocardiogram. Echocardiography is a painless test that uses sound waves to create images of your heart. It provides your doctor with information about the size and shape of your heart and how well your heart's chambers and valves are working. This procedure takes approximately one hour. There are no restrictions for this procedure.    Follow-Up: Your physician wants you to follow-up in: 1 YEAR.  You will receive a reminder letter in the mail two months in advance. If you don't receive a letter, please call our office to schedule the follow-up appointment.   Any Other Special Instructions Will Be Listed Below (If Applicable).     If you need a refill on your cardiac medications before your next appointment, please call your pharmacy.   

## 2019-07-15 ENCOUNTER — Ambulatory Visit: Payer: PPO

## 2019-07-19 NOTE — Patient Instructions (Signed)
Your procedure is scheduled on: 07/26/2019  Report to [facility] at    6:15 AM.  Call this number if you have problems the morning of surgery: [phone]   Remember:   Do not Eat or Drink after midnight         No Smoking the morning of surgery  :  Take these medicines the morning of surgery with A SIP OF WATER: Amlodipine, Singulair, memantine and gabapentin if needed   Do not wear jewelry, make-up or nail polish.  Do not wear lotions, powders, or perfumes. You may wear deodorant.  Do not shave 48 hours prior to surgery. Men may shave face and neck.  Do not bring valuables to the hospital.  Contacts, dentures or bridgework may not be worn into surgery.  Leave suitcase in the car. After surgery it may be brought to your room.  For patients admitted to the hospital, checkout time is 11:00 AM the day of discharge.   Patients discharged the day of surgery will not be allowed to drive home.    Special Instructions: Shower using CHG night before surgery and shower the day of surgery use CHG.  Use special wash - you have one bottle of CHG for all showers.  You should use approximately 1/2 of the bottle for each shower.  Cystoscopy Cystoscopy is a procedure that is used to help diagnose and sometimes treat conditions that affect the lower urinary tract. The lower urinary tract includes the bladder and the urethra. The urethra is the tube that drains urine from the bladder. Cystoscopy is done using a thin, tube-shaped instrument with a light and camera at the end (cystoscope). The cystoscope may be hard or flexible, depending on the goal of the procedure. The cystoscope is inserted through the urethra, into the bladder. Cystoscopy may be recommended if you have:  Urinary tract infections that keep coming back.  Blood in the urine (hematuria).  An inability to control when you urinate (urinary incontinence) or an overactive bladder.  Unusual cells found in a urine sample.  A blockage in  the urethra, such as a urinary stone.  Painful urination.  An abnormality in the bladder found during an intravenous pyelogram (IVP) or CT scan. Cystoscopy may also be done to remove a sample of tissue to be examined under a microscope (biopsy). Tell a health care provider about:  Any allergies you have.  All medicines you are taking, including vitamins, herbs, eye drops, creams, and over-the-counter medicines.  Any problems you or family members have had with anesthetic medicines.  Any blood disorders you have.  Any surgeries you have had.  Any medical conditions you have.  Whether you are pregnant or may be pregnant. What are the risks? Generally, this is a safe procedure. However, problems may occur, including:  Infection.  Bleeding.  Allergic reactions to medicines.  Damage to other structures or organs. What happens before the procedure?  Ask your health care provider about: ? Changing or stopping your regular medicines. This is especially important if you are taking diabetes medicines or blood thinners. ? Taking medicines such as aspirin and ibuprofen. These medicines can thin your blood. Do not take these medicines unless your health care provider tells you to take them. ? Taking over-the-counter medicines, vitamins, herbs, and supplements.  Follow instructions from your health care provider about eating or drinking restrictions.  Ask your health care provider what steps will be taken to help prevent infection. These may include: ? Washing skin  with a germ-killing soap. ? Taking antibiotic medicine.  You may have an exam or testing, such as: ? X-rays of the bladder, urethra, or kidneys. ? Urine tests to check for signs of infection.  Plan to have someone take you home from the hospital or clinic. What happens during the procedure?   You will be given one or more of the following: ? A medicine to help you relax (sedative). ? A medicine to numb the area  (local anesthetic).  The area around the opening of your urethra will be cleaned.  The cystoscope will be passed through your urethra into your bladder.  Germ-free (sterile) fluid will flow through the cystoscope to fill your bladder. The fluid will stretch your bladder so that your health care provider can clearly examine your bladder walls.  Your doctor will look at the urethra and bladder. Your doctor may take a biopsy or remove stones.  The cystoscope will be removed, and your bladder will be emptied. The procedure may vary among health care providers and hospitals. What can I expect after the procedure? After the procedure, it is common to have:  Some soreness or pain in your abdomen and urethra.  Urinary symptoms. These include: ? Mild pain or burning when you urinate. Pain should stop within a few minutes after you urinate. This may last for up to 1 week. ? A small amount of blood in your urine for several days. ? Feeling like you need to urinate but producing only a small amount of urine. Follow these instructions at home: Medicines  Take over-the-counter and prescription medicines only as told by your health care provider.  If you were prescribed an antibiotic medicine, take it as told by your health care provider. Do not stop taking the antibiotic even if you start to feel better. General instructions  Return to your normal activities as told by your health care provider. Ask your health care provider what activities are safe for you.  Do not drive for 24 hours if you were given a sedative during your procedure.  Watch for any blood in your urine. If the amount of blood in your urine increases, call your health care provider.  Follow instructions from your health care provider about eating or drinking restrictions.  If a tissue sample was removed for testing (biopsy) during your procedure, it is up to you to get your test results. Ask your health care provider, or the  department that is doing the test, when your results will be ready.  Drink enough fluid to keep your urine pale yellow.  Keep all follow-up visits as told by your health care provider. This is important. Contact a health care provider if you:  Have pain that gets worse or does not get better with medicine, especially pain when you urinate.  Have trouble urinating.  Have more blood in your urine. Get help right away if you:  Have blood clots in your urine.  Have abdominal pain.  Have a fever or chills.  Are unable to urinate. Summary  Cystoscopy is a procedure that is used to help diagnose and sometimes treat conditions that affect the lower urinary tract.  Cystoscopy is done using a thin, tube-shaped instrument with a light and camera at the end.  After the procedure, it is common to have some soreness or pain in your abdomen and urethra.  Watch for any blood in your urine. If the amount of blood in your urine increases, call your health care provider.  If you were prescribed an antibiotic medicine, take it as told by your health care provider. Do not stop taking the antibiotic even if you start to feel better. This information is not intended to replace advice given to you by your health care provider. Make sure you discuss any questions you have with your health care provider. Document Revised: 03/03/2018 Document Reviewed: 03/03/2018 Elsevier Patient Education  2020 [place] Anesthesia, Adult, Care After This sheet gives you information about how to care for yourself after your procedure. Your health care provider may also give you more specific instructions. If you have problems or questions, contact your health care provider. What can I expect after the procedure? After the procedure, the following side effects are common:  Pain or discomfort at the IV site.  Nausea.  Vomiting.  Sore throat.  Trouble concentrating.  Feeling cold or  chills.  Weak or tired.  Sleepiness and fatigue.  Soreness and body aches. These side effects can affect parts of the body that were not involved in surgery. Follow these instructions at home:  For at least 24 hours after the procedure:  Have a responsible adult stay with you. It is important to have someone help care for you until you are awake and alert.  Rest as needed.  Do not: ? Participate in activities in which you could fall or become injured. ? Drive. ? Use heavy machinery. ? Drink alcohol. ? Take sleeping pills or medicines that cause drowsiness. ? Make important decisions or sign legal documents. ? Take care of children on your own. Eating and drinking  Follow any instructions from your health care provider about eating or drinking restrictions.  When you feel hungry, start by eating small amounts of foods that are soft and easy to digest (bland), such as toast. Gradually return to your regular diet.  Drink enough fluid to keep your urine pale yellow.  If you vomit, rehydrate by drinking water, juice, or clear broth. General instructions  If you have sleep apnea, surgery and certain medicines can increase your risk for breathing problems. Follow instructions from your health care provider about wearing your sleep device: ? Anytime you are sleeping, including during daytime naps. ? While taking prescription pain medicines, sleeping medicines, or medicines that make you drowsy.  Return to your normal activities as told by your health care provider. Ask your health care provider what activities are safe for you.  Take over-the-counter and prescription medicines only as told by your health care provider.  If you smoke, do not smoke without supervision.  Keep all follow-up visits as told by your health care provider. This is important. Contact a health care provider if:  You have nausea or vomiting that does not get better with medicine.  You cannot eat or drink  without vomiting.  You have pain that does not get better with medicine.  You are unable to pass urine.  You develop a skin rash.  You have a fever.  You have redness around your IV site that gets worse. Get help right away if:  You have difficulty breathing.  You have chest pain.  You have blood in your urine or stool, or you vomit blood. Summary  After the procedure, it is common to have a sore throat or nausea. It is also common to feel tired.  Have a responsible adult stay with you for the first 24 hours after general anesthesia. It is important to have someone help care for you  until you are awake and alert.  When you feel hungry, start by eating small amounts of foods that are soft and easy to digest (bland), such as toast. Gradually return to your regular diet.  Drink enough fluid to keep your urine pale yellow.  Return to your normal activities as told by your health care provider. Ask your health care provider what activities are safe for you. This information is not intended to replace advice given to you by your health care provider. Make sure you discuss any questions you have with your health care provider. Document Revised: 03/14/2017 Document Reviewed: 10/25/2016 Elsevier Patient Education  [place].

## 2019-07-23 ENCOUNTER — Other Ambulatory Visit (HOSPITAL_COMMUNITY)
Admission: RE | Admit: 2019-07-23 | Discharge: 2019-07-23 | Disposition: A | Discharge status: Home / Self Care | Payer: PPO | Source: Ambulatory Visit | Attending: Urology | Admitting: Urology

## 2019-07-23 ENCOUNTER — Other Ambulatory Visit: Payer: Self-pay

## 2019-07-23 ENCOUNTER — Encounter (HOSPITAL_COMMUNITY): Payer: Self-pay

## 2019-07-23 ENCOUNTER — Ambulatory Visit (HOSPITAL_COMMUNITY): Admission: RE | Admit: 2019-07-23 | Payer: PPO | Source: Ambulatory Visit

## 2019-07-23 ENCOUNTER — Encounter (HOSPITAL_COMMUNITY)
Admission: RE | Admit: 2019-07-23 | Discharge: 2019-07-23 | Disposition: A | Discharge status: Home / Self Care | Payer: PPO | Source: Ambulatory Visit | Attending: Urology | Admitting: Urology

## 2019-07-23 NOTE — Pre-Procedure Instructions (Signed)
Dr Alyson Ingles sent note through interoffice mail that patient was a no show for her PAT. Have left messages for patient and she has not called back.

## 2019-07-26 ENCOUNTER — Ambulatory Visit (HOSPITAL_COMMUNITY): Admission: RE | Admit: 2019-07-26 | Payer: PPO | Source: Home / Self Care | Admitting: Urology

## 2019-07-26 ENCOUNTER — Encounter (HOSPITAL_COMMUNITY): Admission: RE | Payer: Self-pay | Source: Home / Self Care

## 2019-07-26 SURGERY — CYSTOURETEROSCOPY, WITH RETROGRADE PYELOGRAM AND STENT INSERTION
Anesthesia: General | Laterality: Right

## 2019-08-02 ENCOUNTER — Ambulatory Visit: Payer: PPO | Admitting: Urology

## 2019-08-11 ENCOUNTER — Other Ambulatory Visit: Payer: Self-pay

## 2019-08-11 ENCOUNTER — Encounter: Payer: Self-pay | Admitting: Family Medicine

## 2019-08-11 ENCOUNTER — Ambulatory Visit (INDEPENDENT_AMBULATORY_CARE_PROVIDER_SITE_OTHER): Payer: PPO | Admitting: Family Medicine

## 2019-08-11 VITALS — BP 130/69 | HR 63 | Temp 97.7°F | Ht 61.0 in | Wt 99.2 lb

## 2019-08-11 DIAGNOSIS — D582 Other hemoglobinopathies: Secondary | ICD-10-CM | POA: Diagnosis not present

## 2019-08-11 DIAGNOSIS — N3001 Acute cystitis with hematuria: Secondary | ICD-10-CM | POA: Diagnosis not present

## 2019-08-11 DIAGNOSIS — F17218 Nicotine dependence, cigarettes, with other nicotine-induced disorders: Secondary | ICD-10-CM

## 2019-08-11 DIAGNOSIS — R52 Pain, unspecified: Secondary | ICD-10-CM

## 2019-08-11 DIAGNOSIS — R300 Dysuria: Secondary | ICD-10-CM | POA: Diagnosis not present

## 2019-08-11 DIAGNOSIS — R413 Other amnesia: Secondary | ICD-10-CM | POA: Insufficient documentation

## 2019-08-11 DIAGNOSIS — G3184 Mild cognitive impairment, so stated: Secondary | ICD-10-CM | POA: Diagnosis not present

## 2019-08-11 DIAGNOSIS — J449 Chronic obstructive pulmonary disease, unspecified: Secondary | ICD-10-CM | POA: Diagnosis not present

## 2019-08-11 DIAGNOSIS — E440 Moderate protein-calorie malnutrition: Secondary | ICD-10-CM

## 2019-08-11 DIAGNOSIS — M79604 Pain in right leg: Secondary | ICD-10-CM | POA: Diagnosis not present

## 2019-08-11 DIAGNOSIS — I10 Essential (primary) hypertension: Secondary | ICD-10-CM

## 2019-08-11 DIAGNOSIS — E210 Primary hyperparathyroidism: Secondary | ICD-10-CM

## 2019-08-11 DIAGNOSIS — M79605 Pain in left leg: Secondary | ICD-10-CM

## 2019-08-11 DIAGNOSIS — R630 Anorexia: Secondary | ICD-10-CM

## 2019-08-11 LAB — POCT URINALYSIS DIP (CLINITEK)
Bilirubin, UA: NEGATIVE
Glucose, UA: NEGATIVE mg/dL
Nitrite, UA: POSITIVE — AB
Spec Grav, UA: 1.02 (ref 1.010–1.025)
Urobilinogen, UA: 0.2 E.U./dL
pH, UA: 5 (ref 5.0–8.0)

## 2019-08-11 MED ORDER — CIPROFLOXACIN HCL 250 MG PO TABS: 250.0000 mg | ORAL_TABLET | Freq: Two times a day (BID) | ORAL | 0 refills | Status: DC

## 2019-08-11 MED ORDER — MEMANTINE HCL 10 MG PO TABS: 10.0000 mg | ORAL_TABLET | Freq: Every day | ORAL | 2 refills | Status: DC

## 2019-08-11 NOTE — Assessment & Plan Note (Signed)
Needs updated labs, given change in memory as well

## 2019-08-11 NOTE — Progress Notes (Signed)
Subjective:  Patient ID: NADALEE HAYDEL, female    DOB: 1945-07-16  Age: 74 y.o. MRN: [id number]  CC:  Chief Complaint  Patient presents with  . Follow-up    8 week repeat mmse   . Leg Pain    both upper legs (thigh) x3 days sharp pain hurts worse at night constant pain nothing helps       HPI  HPI   Ms. Imamura is here to follow-up on recently started Namenda 8 weeks ago.  And a repeat MMSE.  Confused on when we discussed her taking the medications as directed.  She reports she is but then she asked what I am asking her.  Husband is not here today she reports he does not have any memory issues.  Then she reports that she thinks her memory is getting a little bit better  She has lost 3 pounds in the last 8 weeks.  She reports that she is eating okay she declines having any trouble chewing or swallowing.  She currently is still smoking about a half a pack a day.  She denies having any trouble with vision, chest pain, shortness of breath, coughing.  She denies having any excessive heartburn.  She reports that she does have burning with urination.  Denies having any pelvic pain, nausea, vomiting, fevers, chills.  But does endorse having a little bit of back pain that started about the same time.    Additionally she reports that she is having some leg pain.  Leg pain started sharply about 3 days ago per her.  But then when questioned again she reports that she has been doing it off and on for a while.  Without being able to be specific and more vague with her timeframe.  She reports is worse at night.  She reports nothing helps but then when questioned a little bit more she reports that she takes a warm bath, uses alcohol rub, will take Tylenol at times. She denies having any falls or injuries.  She reports that she thinks she is sleeping okay.  She denies having any other signs or symptoms of infection or skin issues.  Indication for chronic opioid: neck and back pain  Medication  and dose: 5-325mg  Norco once daily # pills per month: 30 Last UDS date: Today  Opioid Treatment Agreement signed (Y/N): Yes Opioid Treatment Agreement last reviewed with patient: 06/29/2018  1:25 PM [facility] reviewed this encounter (include red flags): Yes  Today patient denies signs and symptoms of COVID 19 infection including fever, chills, cough, shortness of breath, and headache. Past Medical, Surgical, Social History, Allergies, and Medications have been Reviewed.   Past Medical History:  Diagnosis Date  . Anxiety   . ASCVD (arteriosclerotic cardiovascular disease)    No critical disease on cath in 8/97: mild AI with trival, if any l AS; negative stress nuclear in 2010  . Atrophic vaginitis 04/18/2016  . Back pain   . Cervical disc herniation   . Chronic bronchitis   . Colon polyps   . De Quervain's disease (radial styloid tenosynovitis) 10/14/2016  . Depression   . Elevated hemoglobin A1c   . Fasting hyperglycemia   . GERD (gastroesophageal reflux disease)   . Heart disease   . HSV (herpes simplex virus) infection   . Hydronephrosis   . Hypercalcemia 12/30/2008   Qualifier: Diagnosis of  By: Cori Razor LPN, Brandi    . Hypercholesteremia   . Hypertension   . Neck muscle spasm  04/23/2014  . Onychomycosis 07/08/2015  . Osteoporosis   . Peripheral vascular disease ([place])    stauts post aortabifemoral graft    . Secondary erythrocytosis 08/05/2012   Secondary to COPD  . Tobacco abuse    has stopped  . 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 alendronate (FOSAMAX) 70 MG tablet TAKE 1 TABLET EACH WEEK 30 MIN BEFORE BREAKFAST WITH 8 OUNCES OF WATER FOR OSTEOPOROSIS.  Marland Kitchen ALPRAZolam (XANAX) 0.25 MG tablet Take 1 tablet (0.25 mg total) by mouth at bedtime as needed for anxiety. (Patient taking differently: Take 0.25 mg by mouth 2 (two) times daily as needed for anxiety. )  . amLODipine (NORVASC) 10 MG tablet TAKE ONE TABLET BY MOUTH ONCE DAILY. (Patient  taking differently: Take 10 mg by mouth at bedtime. )  . aspirin EC 81 MG tablet Take 81 mg by mouth at bedtime.   . gabapentin (NEURONTIN) 100 MG capsule TAKE (2) CAPSULES BY MOUTH AT BEDTIME. (Patient taking differently: Take 100 mg by mouth at bedtime. )  . HYDROcodone-acetaminophen (NORCO/VICODIN) 5-325 MG tablet Take one tablet by mouth daily for pain (Patient taking differently: Take 1-2 tablets by mouth daily. )  . hydrocortisone (ANUSOL-HC) 2.5 % rectal cream APPLY SPARINGLY TO THE VAGINA AREA TWICE WEEKLY AS NEEDED FOR ITCHING.  . memantine (NAMENDA) 10 MG tablet Take 1 tablet (10 mg total) by mouth daily.  . montelukast (SINGULAIR) 10 MG tablet TAKE (1) TABLET BY MOUTH AT BEDTIME.  . Multiple Vitamin (MULTIVITAMIN WITH MINERALS) TABS Take 1 tablet by mouth every evening.   . potassium chloride SA (KLOR-CON) 20 MEQ tablet Take 1 tablet (20 mEq total) by mouth 2 (two) times daily.  . rivastigmine (EXELON) 9.5 mg/24hr Place 9.5 mg onto the skin daily.  . rosuvastatin (CRESTOR) 40 MG tablet Take 1 tablet (40 mg total) by mouth daily.  . traMADol (ULTRAM) 50 MG tablet Take 1 tablet (50 mg total) by mouth every 6 (six) hours as needed.  . [DISCONTINUED] memantine (NAMENDA) 5 MG tablet Take 1 tablet (5 mg total) by mouth daily.    ROS:  Review of Systems  Reason unable to perform ROS: Poor memory recall.  Genitourinary: Positive for dysuria.  Musculoskeletal:       Leg pain  All other systems reviewed and are negative.  Please see HPI  Objective:   Today's Vitals: BP 130/69 (BP Location: Right Arm, Patient Position: Sitting, Cuff Size: Normal)   Pulse 63   Temp 97.7 F (36.5 C) (Temporal)   Ht 5\' 1"  (1.549 m)   Wt 99 lb 3.2 oz (45 kg)   SpO2 94%   BMI 18.74 kg/m  Vitals with BMI 08/11/2019 07/12/2019 07/12/2019  Height 5\' 1"  - 5\' 2"   Weight 99 lbs 3 oz - 100 lbs  BMI 99991111 - 99991111  Systolic [id number]  Diastolic 69 78 62  Pulse 63 - 77     Physical Exam Vitals and  nursing note reviewed.  Constitutional:      Appearance: Normal appearance. She is well-developed, well-groomed and normal weight.     Comments: Weight is still in normal BMI range but is slowly dropping BMI is 18.74 if she loses much more weight she will be underweight.  HENT:     Head: Normocephalic and atraumatic.     Right Ear: External ear normal.     Left Ear: External ear normal.     Mouth/Throat:  Comments: Mask in place Eyes:     General:        Right eye: No discharge.        Left eye: No discharge.     Conjunctiva/sclera: Conjunctivae normal.     Comments: Glasses  Cardiovascular:     Rate and Rhythm: Normal rate and regular rhythm.     Pulses: Normal pulses.     Heart sounds: Normal heart sounds.  Pulmonary:     Effort: Pulmonary effort is normal.     Breath sounds: Normal breath sounds.  Musculoskeletal:        General: Normal range of motion.     Cervical back: Normal range of motion and neck supple.     Comments: Moves all extremities without any issues or limited range of motion.  Skin:    General: Skin is warm.  Neurological:     General: No focal deficit present.     Mental Status: She is alert and oriented to person, place, and time.  Psychiatric:        Attention and Perception: She is inattentive.        Mood and Affect: Mood normal. Affect is flat.        Speech: Speech normal.        Behavior: Behavior normal. Behavior is cooperative.        Thought Content: Thought content normal.        Cognition and Memory: Cognition normal. Memory is impaired. She exhibits impaired recent memory.        Judgment: Judgment normal.    MMSE - Mini Mental State Exam 08/11/2019 06/16/2019 01/23/2017  Orientation to time 0 2 2  Orientation to Place 5 2 5   Registration 3 3 3   Attention/ Calculation 5 4 5   Recall 0 0 0  Language- name 2 objects 2 2 2   Language- repeat 1 1 1   Language- follow 3 step command 3 3 3   Language- read & follow direction 1 1 1   Write a  sentence 1 1 1   Copy design 1 0 0  Total score 22 19 23      Assessment   1. Malnutrition of moderate degree (HCC)   2. Emphysema with chronic bronchitis (HCC)   3. Primary hyperparathyroidism (HCC)   4. Elevated hemoglobin (HCC)   5. MCI (mild cognitive impairment) with memory loss   6. Dysuria   7. Pain in both lower extremities   8. Essential hypertension   9. Cigarette nicotine dependence with other nicotine-induced disorder   10. Hypercalcemia   11. Anorexia   12. Acute cystitis with hematuria   13. Pain management     Tests ordered Orders Placed This Encounter  Procedures  . Urine Culture  . PTH, Intact and Calcium  . CBC  . COMPLETE METABOLIC PANEL WITH GFR  . ToxASSURE Select 13 (MW), Urine  . Ambulatory referral to Hematology  . POCT URINALYSIS DIP (CLINITEK)     Plan: Please see assessment and plan per problem list above.   Meds ordered this encounter  Medications  . memantine (NAMENDA) 10 MG tablet    Sig: Take 1 tablet (10 mg total) by mouth daily.    Dispense:  30 tablet    Refill:  2    Order Specific Question:   Supervising Provider    Answer:   SIMPSON, MARGARET E [id number]  . ciprofloxacin (CIPRO) 250 MG tablet    Sig: Take 1 tablet (250 mg total) by mouth 2 (two)  times daily.    Dispense:  6 tablet    Refill:  0    Order Specific Question:   Supervising Provider    Answer:   Fayrene Helper [id number]    Patient to follow-up in 3 months  Perlie Mayo, NP

## 2019-08-11 NOTE — Assessment & Plan Note (Addendum)
Increase Namenda to 10 mg MMSE slightly better 22/30, however conservation is still showing some confusion. Will check PTH as well

## 2019-08-11 NOTE — Assessment & Plan Note (Addendum)
UA dip today + Sent out for C/S  Tx provided today.

## 2019-08-11 NOTE — Assessment & Plan Note (Signed)
She reports pain in front of legs. Poorly detailed in conservation.

## 2019-08-11 NOTE — Assessment & Plan Note (Signed)
Continue medication regimen. Not currently having S&S

## 2019-08-11 NOTE — Assessment & Plan Note (Signed)
Asked about quitting: confirms they are currently smokes cigarettes Advise to quit smoking: Educated about QUITTING to reduce the risk of cancer, cardio and cerebrovascular disease. Assess willingness: Unwilling to quit at this time, but is working on cutting back. Assist with counseling and pharmacotherapy: Counseled for 5 minutes and literature provided. Arrange for follow up:  not quitting follow up in 3 months and continue to offer help.   

## 2019-08-11 NOTE — Assessment & Plan Note (Signed)
Referral to hematology/oncology was made at the beginning of the year however she never showed up to the appointments.  Referral will be made again updated labs will be collected as she continues to lose weight.  Appointment was made prior to her leaving the office today and she was provided with the information written down.

## 2019-08-11 NOTE — Patient Instructions (Addendum)
I appreciate the opportunity to provide you with care for your health and wellness. Today we discussed: Memory, leg pain, dysuria  Follow up: 3 months in office memory  Labs today at [facility] today to Hematology   Medication for UTI  Increased Memory medication-Namenda to 10 mg daily  Please increase your intake of protein.  Please continue to practice social distancing to keep you, your family, and our community safe.  If you must go out, please wear a mask and practice good handwashing.  It was a pleasure to see you and I look forward to continuing to work together on your health and well-being. Please do not hesitate to call the office if you need care or have questions about your care.  Have a wonderful day and week. With Gratitude, Cherly Beach, DNP, AGNP-BC

## 2019-08-11 NOTE — Assessment & Plan Note (Signed)
Carla [name] is encouraged to maintain a well balanced diet that is low in salt. Controlled, continue current medication regimen.   Additionally, she is also reminded that exercise is beneficial for heart health and control of  Blood pressure. 30-60 minutes daily is recommended-walking was suggested.

## 2019-08-12 ENCOUNTER — Other Ambulatory Visit (HOSPITAL_COMMUNITY)
Admission: RE | Admit: 2019-08-12 | Discharge: 2019-08-12 | Disposition: A | Discharge status: Home / Self Care | Payer: PPO | Source: Other Acute Inpatient Hospital | Attending: Family Medicine | Admitting: Family Medicine

## 2019-08-12 DIAGNOSIS — N3001 Acute cystitis with hematuria: Secondary | ICD-10-CM | POA: Diagnosis not present

## 2019-08-12 DIAGNOSIS — R52 Pain, unspecified: Secondary | ICD-10-CM | POA: Diagnosis not present

## 2019-08-12 LAB — COMPLETE METABOLIC PANEL WITH GFR
AG Ratio: 1.8 (calc) (ref 1.0–2.5)
ALT: 10 U/L (ref 6–29)
AST: 15 U/L (ref 10–35)
Albumin: 4.4 g/dL (ref 3.6–5.1)
Alkaline phosphatase (APISO): 95 U/L (ref 37–153)
BUN: 17 mg/dL (ref 7–25)
CO2: 28 mmol/L (ref 20–32)
Calcium: 10.7 mg/dL — ABNORMAL HIGH (ref 8.6–10.4)
Chloride: 106 mmol/L (ref 98–110)
Creat: 0.78 mg/dL (ref 0.60–0.93)
GFR, Est African American: 87 mL/min/{1.73_m2} (ref >=60)
GFR, Est Non African American: 75 mL/min/{1.73_m2} (ref >=60)
Globulin: 2.5 g/dL (calc) (ref 1.9–3.7)
Glucose, Bld: 66 mg/dL (ref 65–139)
Potassium: 3.8 mmol/L (ref 3.5–5.3)
Sodium: 142 mmol/L (ref 135–146)
Total Bilirubin: 0.4 mg/dL (ref 0.2–1.2)
Total Protein: 6.9 g/dL (ref 6.1–8.1)

## 2019-08-12 LAB — CBC
HCT: 49.7 % — ABNORMAL HIGH (ref 35.0–45.0)
Hemoglobin: 16.5 g/dL — ABNORMAL HIGH (ref 11.7–15.5)
MCH: 31.4 pg (ref 27.0–33.0)
MCHC: 33.2 g/dL (ref 32.0–36.0)
MCV: 94.5 fL (ref 80.0–100.0)
MPV: 9.9 fL (ref 7.5–12.5)
Platelets: 273 10*3/uL (ref 140–400)
RBC: 5.26 10*6/uL — ABNORMAL HIGH (ref 3.80–5.10)
RDW: 13.4 % (ref 11.0–15.0)
WBC: 7.6 10*3/uL (ref 3.8–10.8)

## 2019-08-12 LAB — PTH, INTACT AND CALCIUM
Calcium: 10.7 mg/dL — ABNORMAL HIGH (ref 8.6–10.4)
PTH: 66 pg/mL — ABNORMAL HIGH (ref 14–64)

## 2019-08-12 NOTE — Assessment & Plan Note (Signed)
Referral made to Hematologt-she did not keep appt earlier this year.  Appt made today before she left.

## 2019-08-14 LAB — URINE CULTURE: Culture: >=100000 — AB

## 2019-08-16 LAB — MISC LABCORP TEST (SEND OUT): Labcorp test code: 738526

## 2019-08-18 ENCOUNTER — Other Ambulatory Visit: Payer: Self-pay | Admitting: Family Medicine

## 2019-08-18 DIAGNOSIS — N3001 Acute cystitis with hematuria: Secondary | ICD-10-CM

## 2019-08-18 MED ORDER — SULFAMETHOXAZOLE-TRIMETHOPRIM 800-160 MG PO TABS: 1.0000 | ORAL_TABLET | Freq: Two times a day (BID) | ORAL | 0 refills | Status: AC

## 2019-08-24 ENCOUNTER — Encounter (HOSPITAL_COMMUNITY): Payer: Self-pay

## 2019-08-25 ENCOUNTER — Inpatient Hospital Stay (HOSPITAL_COMMUNITY): Payer: PPO

## 2019-08-25 ENCOUNTER — Other Ambulatory Visit: Payer: Self-pay

## 2019-08-25 ENCOUNTER — Encounter (HOSPITAL_COMMUNITY): Payer: Self-pay | Admitting: Hematology

## 2019-08-25 ENCOUNTER — Inpatient Hospital Stay (HOSPITAL_COMMUNITY): Payer: PPO | Attending: Hematology | Admitting: Hematology

## 2019-08-25 VITALS — BP 114/63 | HR 78 | Temp 97.1°F | Resp 18 | Ht 64.0 in | Wt 98.0 lb

## 2019-08-25 DIAGNOSIS — D582 Other hemoglobinopathies: Secondary | ICD-10-CM

## 2019-08-25 DIAGNOSIS — N200 Calculus of kidney: Secondary | ICD-10-CM

## 2019-08-25 DIAGNOSIS — R634 Abnormal weight loss: Secondary | ICD-10-CM | POA: Diagnosis not present

## 2019-08-25 DIAGNOSIS — D751 Secondary polycythemia: Secondary | ICD-10-CM | POA: Insufficient documentation

## 2019-08-25 DIAGNOSIS — Z720 Tobacco use: Secondary | ICD-10-CM | POA: Diagnosis not present

## 2019-08-25 LAB — LACTATE DEHYDROGENASE: LDH: 136 U/L (ref 98–192)

## 2019-08-25 LAB — VITAMIN D 25 HYDROXY (VIT D DEFICIENCY, FRACTURES): Vit D, 25-Hydroxy: 31.72 ng/mL (ref 30–100)

## 2019-08-25 LAB — COMPREHENSIVE METABOLIC PANEL
ALT: 15 U/L (ref 0–44)
AST: 22 U/L (ref 15–41)
Albumin: 4.9 g/dL (ref 3.5–5.0)
Alkaline Phosphatase: 95 U/L (ref 38–126)
Anion gap: 13 (ref 5–15)
BUN: 14 mg/dL (ref 8–23)
CO2: 26 mmol/L (ref 22–32)
Calcium: 10.4 mg/dL — ABNORMAL HIGH (ref 8.9–10.3)
Chloride: 101 mmol/L (ref 98–111)
Creatinine, Ser: 0.9 mg/dL (ref 0.44–1.00)
GFR calc Af Amer: >60 mL/min (ref >60)
GFR calc non Af Amer: >60 mL/min (ref >60)
Glucose, Bld: 105 mg/dL — ABNORMAL HIGH (ref 70–99)
Potassium: 3.2 mmol/L — ABNORMAL LOW (ref 3.5–5.1)
Sodium: 140 mmol/L (ref 135–145)
Total Bilirubin: 0.8 mg/dL (ref 0.3–1.2)
Total Protein: 8.1 g/dL (ref 6.5–8.1)

## 2019-08-25 LAB — CBC WITH DIFFERENTIAL/PLATELET
Abs Immature Granulocytes: 0.02 10*3/uL (ref 0.00–0.07)
Basophils Absolute: 0.1 10*3/uL (ref 0.0–0.1)
Basophils Relative: 1 %
Eosinophils Absolute: 0.1 10*3/uL (ref 0.0–0.5)
Eosinophils Relative: 1 %
HCT: 53.5 % — ABNORMAL HIGH (ref 36.0–46.0)
Hemoglobin: 17.5 g/dL — ABNORMAL HIGH (ref 12.0–15.0)
Immature Granulocytes: 0 %
Lymphocytes Relative: 45 %
Lymphs Abs: 4.3 10*3/uL — ABNORMAL HIGH (ref 0.7–4.0)
MCH: 31.5 pg (ref 26.0–34.0)
MCHC: 32.7 g/dL (ref 30.0–36.0)
MCV: 96.4 fL (ref 80.0–100.0)
Monocytes Absolute: 0.6 10*3/uL (ref 0.1–1.0)
Monocytes Relative: 7 %
Neutro Abs: 4.3 10*3/uL (ref 1.7–7.7)
Neutrophils Relative %: 46 %
Platelets: 287 10*3/uL (ref 150–400)
RBC: 5.55 MIL/uL — ABNORMAL HIGH (ref 3.87–5.11)
RDW: 14.5 % (ref 11.5–15.5)
WBC: 9.4 10*3/uL (ref 4.0–10.5)
nRBC: 0 % (ref 0.0–0.2)

## 2019-08-25 LAB — SEDIMENTATION RATE: Sed Rate: 2 mm/hr (ref 0–22)

## 2019-08-25 LAB — VITAMIN B12: Vitamin B-12: 545 pg/mL (ref 180–914)

## 2019-08-25 NOTE — Patient Instructions (Signed)
[facility] at [facility] Discharge Instructions  Follow up in 2 weeks    Thank you for choosing [facility] at [facility] to provide your oncology and hematology care.  To afford each patient quality time with our provider, please arrive at least 15 minutes before your scheduled appointment time.   If you have a lab appointment with the Cancer Center please come in thru the Main Entrance and check in at the main information desk.  You need to re-schedule your appointment should you arrive 10 or more minutes late.  We strive to give you quality time with our providers, and arriving late affects you and other patients whose appointments are after yours.  Also, if you no show three or more times for appointments you may be dismissed from the clinic at the providers discretion.     Again, thank you for choosing [facility].  Our hope is that these requests will decrease the amount of time that you wait before being seen by our physicians.       _____________________________________________________________  Should you have questions after your visit to [facility], please contact our office at [phone] between the hours of 8:00 a.m. and 4:30 p.m.  Voicemails left after 4:00 p.m. will not be returned until the following business day.  For prescription refill requests, have your pharmacy contact our office and allow 72 hours.    Due to Covid, you will need to wear a mask upon entering the hospital. If you do not have a mask, a mask will be given to you at the Main Entrance upon arrival. For doctor visits, patients may have 1 support person with them. For treatment visits, patients can not have anyone with them due to social distancing guidelines and our immunocompromised population.      

## 2019-08-25 NOTE — Progress Notes (Signed)
CONSULT NOTE  Patient Care Team: Fayrene Helper, MD as PCP - General Fields, Marga Melnick, MD (Inactive) as Consulting Physician (Gastroenterology)  CHIEF COMPLAINTS/PURPOSE OF CONSULTATION: Elevated hemoglobin  HISTORY OF PRESENTING ILLNESS:  Carla [name] 74 y.o. female was sent here by her PCP [facility] for elevated hemoglobin.  Patient has documented elevated hemoglobin since 2009.  Her latest hemoglobin was 17.1.  She reports she has not started any new medication.  She did have a round of ciprofloxacin for a UTI.  She does have a kidney stone that she has had for a while.  Every urinalysis done shows moderate to large amount of blood.  She denies any steroids or diuretics.  She is a smoker.  She smokes 1 pack/day since the age of 54.  She last had a CT of her lung in February of this year.  She denies any aquagenic pruritus.  She denies any B symptoms including fevers, chills, night sweats.  She does report she has lost over 25 pounds in the past couple of months.  All of her age-appropriate screenings are up-to-date.  Mammogram was BI-RADS Category 1 negative.  She has had a recent CT CAP.  Colonoscopy was within 5 years.  She has had work-up in the past including HIV, hepatitis C which were both negative.  Patient has not noticed any recent bleeding such as epistasis, hematuria or hematochezia.  She denies any over-the-counter ingestion of NSAIDs.  She has had no prior history or diagnosis of cancer.  She does not donate blood.  Patient denies a family history of any type of cancer.   MEDICAL HISTORY:  Past Medical History:  Diagnosis Date  . Anxiety   . ASCVD (arteriosclerotic cardiovascular disease)    No critical disease on cath in 8/97: mild AI with trival, if any l AS; negative stress nuclear in 2010  . Atrophic vaginitis 04/18/2016  . Back pain   . Cervical disc herniation   . Chronic bronchitis   . Colon polyps   . De Quervain's disease (radial styloid tenosynovitis)  10/14/2016  . Depression   . Elevated hemoglobin A1c   . Fasting hyperglycemia   . GERD (gastroesophageal reflux disease)   . Heart disease   . HSV (herpes simplex virus) infection   . Hydronephrosis   . Hypercalcemia 12/30/2008   Qualifier: Diagnosis of  By: Cori Razor LPN, Brandi    . Hypercholesteremia   . Hypertension   . Neck muscle spasm 04/23/2014  . Onychomycosis 07/08/2015  . Osteoporosis   . Peripheral vascular disease ([place])    stauts post aortabifemoral graft    . Secondary erythrocytosis 08/05/2012   Secondary to COPD  . Tobacco abuse    has stopped  . 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 ABDOMINAL HYSTERECTOMY    . aorta bifem graft    . CHOLECYSTECTOMY    . COLONOSCOPY  06/23/2006   Dr. Fields:Normal colon without evidence of polyps, masses, inflammatory changes/Normal retroflex view of the rectum  . COLONOSCOPY N/A 08/24/2013   Procedure: COLONOSCOPY;  Surgeon: Danie Binder, MD;  Location: [facility];  Service: Endoscopy;  Laterality: N/A;  10:30-moved to [place] notified pt  . lysis of adhesions  2000  . PARTIAL HYSTERECTOMY    . right kidney surgery following damage during arterial surgery    . TOTAL ABDOMINAL HYSTERECTOMY W/ BILATERAL SALPINGOOPHORECTOMY  1975  SOCIAL HISTORY: Social History   Socioeconomic History  . Marital status: Married    Spouse name: Not on file  . Number of children: 3  . Years of education: Not on file  . Highest education level: Not on file  Occupational History  . Occupation: disabled   Tobacco Use  . Smoking status: Current Every Day Smoker    Packs/day: 0.50    Years: 50.00    Pack years: 25.00    Types: Cigarettes    Last attempt to quit: 12/12/2010    Years since quitting: 8.7  . Smokeless tobacco: Never Used  Substance and Sexual Activity  . Alcohol use: No  . Drug use: No  . Sexual activity: Not Currently    Birth  control/protection: Surgical  Other Topics Concern  . Not on file  Social History Narrative   2 Children living 1 deceased    Social Determinants of Health   Financial Resource Strain:   . Difficulty of Paying Living Expenses:   Food Insecurity:   . Worried About Charity fundraiser in the Last Year:   . Arboriculturist in the Last Year:   Transportation Needs:   . Film/video editor (Medical):   Marland Kitchen Lack of Transportation (Non-Medical):   Physical Activity:   . Days of Exercise per Week:   . Minutes of Exercise per Session:   Stress:   . Feeling of Stress :   Social Connections:   . Frequency of Communication with Friends and Family:   . Frequency of Social Gatherings with Friends and Family:   . Attends Religious Services:   . Active Member of Clubs or Organizations:   . Attends Archivist Meetings:   Marland Kitchen Marital Status:   Intimate Partner Violence:   . Fear of Current or Ex-Partner:   . Emotionally Abused:   Marland Kitchen Physically Abused:   . Sexually Abused:     FAMILY HISTORY: Family History  Problem Relation Age of Onset  . Heart attack Father   . COPD Sister   . Heart disease Sister   . Diabetes Sister   . Coronary artery disease Sister   . Liver cancer Sister 63  . Diabetes Sister   . Diabetes Sister   . COPD Sister   . Heart attack Mother   . COPD Brother   . Heart attack Son   . Colon cancer Neg Hx     ALLERGIES:  is allergic to aricept [donepezil hcl]; calcium-containing compounds; and exelon [rivastigmine tartrate].  MEDICATIONS:  Current Outpatient Medications  Medication Sig Dispense Refill  . alendronate (FOSAMAX) 70 MG tablet TAKE 1 TABLET EACH WEEK 30 MIN BEFORE BREAKFAST WITH 8 OUNCES OF WATER FOR OSTEOPOROSIS. 4 tablet 4  . amLODipine (NORVASC) 10 MG tablet TAKE ONE TABLET BY MOUTH ONCE DAILY. (Patient taking differently: Take 10 mg by mouth at bedtime. ) 90 tablet 0  . aspirin EC 81 MG tablet Take 81 mg by mouth at bedtime.     .  gabapentin (NEURONTIN) 100 MG capsule TAKE (2) CAPSULES BY MOUTH AT BEDTIME. (Patient taking differently: Take 100 mg by mouth at bedtime. ) 60 capsule 0  . memantine (NAMENDA) 10 MG tablet Take 1 tablet (10 mg total) by mouth daily. 30 tablet 2  . montelukast (SINGULAIR) 10 MG tablet TAKE (1) TABLET BY MOUTH AT BEDTIME. 30 tablet 0  . Multiple Vitamin (MULTIVITAMIN WITH MINERALS) TABS Take 1 tablet by mouth every evening.     Marland Kitchen  potassium chloride SA (KLOR-CON) 20 MEQ tablet Take 1 tablet (20 mEq total) by mouth 2 (two) times daily. 14 tablet 0  . rivastigmine (EXELON) 9.5 mg/24hr Place 9.5 mg onto the skin daily.    . rosuvastatin (CRESTOR) 40 MG tablet Take 1 tablet (40 mg total) by mouth daily. 90 tablet 3  . traMADol (ULTRAM) 50 MG tablet Take 1 tablet (50 mg total) by mouth every 6 (six) hours as needed. 20 tablet 0  . ALPRAZolam (XANAX) 0.25 MG tablet Take 1 tablet (0.25 mg total) by mouth at bedtime as needed for anxiety. (Patient not taking: Reported on 08/25/2019) 30 tablet 0  . HYDROcodone-acetaminophen (NORCO/VICODIN) 5-325 MG tablet Take one tablet by mouth daily for pain (Patient not taking: Reported on 08/25/2019) 30 tablet 0  . hydrocortisone (ANUSOL-HC) 2.5 % rectal cream APPLY SPARINGLY TO THE VAGINA AREA TWICE WEEKLY AS NEEDED FOR ITCHING. (Patient not taking: Reported on 08/25/2019) 30 g 0   No current facility-administered medications for this visit.    REVIEW OF SYSTEMS:   Constitutional: Denies fevers, chills or abnormal night sweats, + headaches Respiratory: Denies cough, dyspnea or wheezes, +SOB Cardiovascular: Denies palpitation, chest discomfort or lower extremity swelling Gastrointestinal:  Denies nausea, heartburn or change in bowel habits,  +Constipation Skin: Denies abnormal skin rashes Lymphatics: Denies new lymphadenopathy or easy bruising Neurological:Denies numbness, tingling or new weaknesses Behavioral/Psych: Mood is stable, no new changes + anxiety depression  sleeping problems All other systems were reviewed with the patient and are negative.  PHYSICAL EXAMINATION: ECOG PERFORMANCE STATUS: 1 - Symptomatic but completely ambulatory  Vitals:   08/25/19 1331  BP: 114/63  Pulse: 78  Resp: 18  Temp: (!) 97.1 F (36.2 C)  SpO2: 99%   Filed Weights   08/25/19 1331  Weight: 98 lb (44.5 kg)    GENERAL:alert, no distress and comfortable SKIN: skin color, texture, turgor are normal, no rashes or significant lesions NECK: supple, thyroid normal size, non-tender, without nodularity LYMPH:  no palpable lymphadenopathy in the cervical, axillary or inguinal LUNGS: clear to auscultation and percussion with normal breathing effort HEART: regular rate & rhythm and no murmurs and no lower extremity edema ABDOMEN:abdomen soft, non-tender and normal bowel sounds Musculoskeletal:no cyanosis of digits and no clubbing  PSYCH: alert & oriented x 3 with fluent speech NEURO: no focal motor/sensory deficits  LABORATORY DATA:  I have reviewed the data as listed Recent Results (from the past 2160 hour(s))  Bladder Scan (Post Void Residual) in office     Status: None   Collection Time: 06/02/19  3:38 PM  Result Value Ref Range   Scan Result 13.4   POCT urinalysis dipstick     Status: Abnormal   Collection Time: 06/02/19  3:39 PM  Result Value Ref Range   Color, UA yellow    Clarity, UA cloudy    Glucose, UA Negative Negative   Bilirubin, UA small    Ketones, UA neg    Spec Grav, UA >=1.030 (A) 1.010 - 1.025   Blood, UA moderate    pH, UA 5.0 5.0 - 8.0   Protein, UA Positive (A) Negative    Comment: small   Urobilinogen, UA negative (A) 0.2 or 1.0 E.U./dL   Nitrite, UA neg    Leukocytes, UA Moderate (2+) (A) Negative   Appearance clear    Odor    Miscellaneous [facility] test (send-out)     Status: None   Collection Time: 06/03/19 10:33 AM  Result Value Ref Range  [facility] test code [id number]    [facility] test name TOXASSURE SELECT 13    Source  ([facility]) URINE, CLEAN CATCH     Comment: Performed at [facility], [address] [place], [place] [address]   Misc [facility] result COMMENT     Comment: (NOTE) Test Ordered: [id number] ToxASSURE Select 13 (MW) Summary Report (Summary)       Note                      OTSNC   ==================================================================== ToxASSURE Select 13 (MW) ==================================================================== Test                             Result       Flag       Units Drug Present and Declared for Prescription Verification  Tramadol                       14700        EXPECTED   ng/mg creat  O-Desmethyltramadol            5488         EXPECTED   ng/mg creat  N-Desmethyltramadol            827          EXPECTED   ng/mg creat   Source of tramadol is a prescription medication. O-desmethyltramadol   and N-desmethyltramadol are expected metabolites of tramadol. Drug Absent but Declared for Prescription Verification  Alprazolam                     Not Detected UNEXPECTED ng/mg creat  Hydrocodone                    Not Detected UNEXPECTED ng/mg creat ===================================================== =============== Test                      Result    Flag   Units      Ref Range  Creatinine              26               mg/dL      >=20 ==================================================================== Declared Medications: The flagging and interpretation on this report are based on the following declared medications.  Unexpected results may arise from inaccuracies in the declared medications. **Note: The testing scope of this panel includes these medications: Alprazolam (Xanax) Hydrocodone (Vicodin) Tramadol (Ultram) **Note: The testing scope of this panel does not include the following reported medications: Acetaminophen (Vicodin) Gabapentin (Neurontin) ==================================================================== For clinical consultation, please  call [phone]. ==================================================================== Performed At: [facility] [place], [place] [id number] Rush Farmer MD [id number] [facility] At: [place], [place] [id number] Avis Epley PhD [id number] Performed At: [facility] [place], [place] [id number] Thomasene Ripple MD [id number]   VITAMIN D 25 Hydroxy (Vit-D Deficiency, Fractures)     Status: None   Collection Time: 06/16/19  1:50 PM  Result Value Ref Range   Vit D, 25-Hydroxy 43 30 - 100 ng/mL    Comment: Vitamin D Status         25-OH Vitamin D: . Deficiency:                    <20 ng/mL Insufficiency:  20 - 29 ng/mL Optimal:                 > or = 30 ng/mL . For 25-OH Vitamin D testing on patients on  D2-supplementation and patients for whom quantitation  of D2 and D3 fractions is required, the Quest[facility]ureD(TM) 25-OH VIT D, (D2,D3), LC/MS/MS is recommended: order  code [id number] (patients >38yrs). See Note 1 . Note 1 . For additional information, please refer to  http://education.[facility].com/faq/FAQ199  (This link is being provided for informational/ educational purposes only.)   Vitamin B12     Status: None   Collection Time: 06/16/19  1:50 PM  Result Value Ref Range   Vitamin B-12 855 200 - 1,100 pg/mL  POCT URINALYSIS DIP (CLINITEK)     Status: Abnormal   Collection Time: 08/11/19  2:32 PM  Result Value Ref Range   Color, UA yellow yellow   Clarity, UA cloudy (A) clear   Glucose, UA negative negative mg/dL   Bilirubin, UA negative negative   Ketones, POC UA trace (5) (A) negative mg/dL   Spec Grav, UA 1.020 1.010 - 1.025   Blood, UA small (A) negative   pH, UA 5.0 5.0 - 8.0   POC PROTEIN,UA trace negative, trace   Urobilinogen, UA 0.2 0.2 or 1.0 E.U./dL   Nitrite, UA Positive (A) Negative   Leukocytes, UA Trace (A) Negative  PTH, Intact and  Calcium     Status: Abnormal   Collection Time: 08/11/19  2:55 PM  Result Value Ref Range   PTH 66 (H) 14 - 64 pg/mL    Comment: . Interpretive Guide    Intact PTH           Calcium ------------------    ----------           ------- Normal Parathyroid    Normal               Normal Hypoparathyroidism    Low or Low Normal    Low Hyperparathyroidism    Primary            Normal or High       High    Secondary          High                 Normal or Low    Tertiary           High                 High Non-Parathyroid    Hypercalcemia      Low or Low Normal    High .    Calcium 10.7 (H) 8.6 - 10.4 mg/dL  COMPLETE METABOLIC PANEL WITH GFR     Status: Abnormal   Collection Time: 08/11/19  2:55 PM  Result Value Ref Range   Glucose, Bld 66 65 - 139 mg/dL    Comment: .        Non-fasting reference interval .    BUN 17 7 - 25 mg/dL   Creat 0.78 0.60 - 0.93 mg/dL    Comment: For patients >68 years of age, the reference limit for Creatinine is approximately 13% higher for people identified as African-American. .    GFR, Est Non African American 75 > OR = 60 mL/min/1.63m2   GFR, Est African American 87 > OR = 60 mL/min/1.4m2   BUN/Creatinine Ratio NOT APPLICABLE 6 - 22 (calc)   Sodium 142 135 - 146 mmol/L  Potassium 3.8 3.5 - 5.3 mmol/L   Chloride 106 98 - 110 mmol/L   CO2 28 20 - 32 mmol/L   Calcium 10.7 (H) 8.6 - 10.4 mg/dL   Total Protein 6.9 6.1 - 8.1 g/dL   Albumin 4.4 3.6 - 5.1 g/dL   Globulin 2.5 1.9 - 3.7 g/dL (calc)   AG Ratio 1.8 1.0 - 2.5 (calc)   Total Bilirubin 0.4 0.2 - 1.2 mg/dL   Alkaline phosphatase (APISO) 95 37 - 153 U/L   AST 15 10 - 35 U/L   ALT 10 6 - 29 U/L  CBC     Status: Abnormal   Collection Time: 08/11/19  2:55 PM  Result Value Ref Range   WBC 7.6 3.8 - 10.8 Thousand/uL   RBC 5.26 (H) 3.80 - 5.10 Million/uL   Hemoglobin 16.5 (H) 11.7 - 15.5 g/dL   HCT 49.7 (H) 35.0 - 45.0 %   MCV 94.5 80.0 - 100.0 fL   MCH 31.4 27.0 - 33.0 pg   MCHC 33.2 32.0  - 36.0 g/dL   RDW 13.4 11.0 - 15.0 %   Platelets 273 140 - 400 Thousand/uL   MPV 9.9 7.5 - 12.5 fL  Culture, Urine     Status: Abnormal   Collection Time: 08/12/19 10:29 AM   Specimen: Urine, Clean Catch  Result Value Ref Range   Specimen Description      URINE, CLEAN CATCH Performed at [facility], [address] [place], [place] [address]    Special Requests      NONE Performed at [facility], [address], [place], [place] [address]    Culture >=100,000 COLONIES/mL ESCHERICHIA COLI (A)    Report Status 08/14/2019 FINAL    Organism ID, Bacteria ESCHERICHIA COLI (A)       Susceptibility   Escherichia coli - MIC*    AMPICILLIN <=2 SENSITIVE Sensitive     CEFAZOLIN <=4 SENSITIVE Sensitive     CEFTRIAXONE <=1 SENSITIVE Sensitive     CIPROFLOXACIN >=4 RESISTANT Resistant     GENTAMICIN <=1 SENSITIVE Sensitive     IMIPENEM <=0.25 SENSITIVE Sensitive     NITROFURANTOIN <=16 SENSITIVE Sensitive     TRIMETH/SULFA <=20 SENSITIVE Sensitive     AMPICILLIN/SULBACTAM <=2 SENSITIVE Sensitive     PIP/TAZO <=4 SENSITIVE Sensitive     * >=100,000 COLONIES/mL ESCHERICHIA COLI  Miscellaneous [facility] test (send-out)     Status: None   Collection Time: 08/12/19 10:30 AM  Result Value Ref Range   [facility] test code [id number]    [facility] test name TOXASSURE SELECT 13    Source ([facility]) URINE, CLEAN CATCH     Comment: Performed at [facility], [address], [place], [place] [address]   Misc [facility] result COMMENT     Comment: (NOTE) Test Ordered: [id number] ToxASSURE Select 13 (MW) Summary Report (Summary)       Note                      OTSNC   ==================================================================== ToxASSURE Select 13 (MW) ==================================================================== Test                             Result       Flag       Units Drug Present and Declared for Prescription Verification  Alprazolam                     23  EXPECTED   ng/mg creat   Alpha-hydroxyalprazolam        173          EXPECTED   ng/mg creat   Source of alprazolam is a scheduled prescription medication. Alpha-   hydroxyalprazolam is an expected metabolite of alprazolam. Drug Absent but Declared for Prescription Verification  Hydrocodone                    Not Detected UNEXPECTED ng/mg creat  Tramadol                       Not Detected UNEXPECTED ng/mg creat ==================================================================== Test                      Result    Flag   Units      Ref  Range  Creatinine              111              mg/dL      >=20 ==================================================================== Declared Medications: The flagging and interpretation on this report are based on the following declared medications.  Unexpected results may arise from inaccuracies in the declared medications. **Note: The testing scope of this panel includes these medications: Alprazolam (Xanax) Hydrocodone (Norco) Tramadol (Ultram) **Note: The testing scope of this panel does not include the following reported medications: Acetaminophen (Norco) Alendronate (Fosamax) Amlodipine (Norvasc) Aspirin Ciprofloxacin (Cipro) Gabapentin (Neurontin) Hydrocortisone Memantine (Namenda) Montelukast (Singulair) Multivitamin Potassium (Klor-Con) Rivastigmine (Exelon) Rosuvastatin (Crestor) ==================================================================== For clinical consultation, please call [phone]. ============================================= ======================= Performed At: [facility] [place], [place] [id number] Rush Farmer MD [id number] Performed At: [place], [place] [id number] Avis Epley PhD [id number]     RADIOGRAPHIC STUDIES: I have personally reviewed the radiological images as listed and agreed with the findings in the report.  ASSESSMENT: 1.   Erythrocytosis: -Evaluated for intermittent erythrocytosis.  Latest CBC on 08/11/2019 shows hemoglobin 16.5 and hematocrit of 49.7. -Current active smoker, 1 pack/day since age 23. -Denies any vasomotor symptoms, erythromelalgia or aquagenic pruritus. -CTAP on 06/03/2019 shows normal spleen.  Tiny nonobstructing bilateral renal calculi.  No hydronephrosis. -Physical exam does not reveal any lymphadenopathy or splenomegaly.  PLAN:  1.  Erythrocytosis: -We will repeat her CBC today.  We will check for myeloproliferative disorders including polycythemia vera. -We will also check serum erythropoietin level. -We will review results in 2 weeks.  2.  Weight loss: -She reported 25 pound weight loss in the last 6 months.  Reports reasonably good appetite but eating less portions. -CTAP on 06/03/2019 did not show any suspicious findings for malignancy. -CT of the chest on 04/30/2019 did not show any lung masses.  3.  Mild hypercalcemia: -Calcium is elevated between 10 and 11. -Intact PTH level was mildly elevated at 66. -In view of kidney stones, I think she needs further evaluation for primary hyperparathyroidism.   All questions were answered. The patient knows to call the clinic with any problems, questions or concerns.      Glennie Isle, NP-C 08/25/19 2:29 PM

## 2019-08-26 LAB — PTH, INTACT AND CALCIUM
Calcium, Total (PTH): 10.8 mg/dL — ABNORMAL HIGH (ref 8.7–10.3)
PTH: 44 pg/mL (ref 15–65)

## 2019-08-26 LAB — PROTEIN ELECTROPHORESIS, SERUM
A/G Ratio: 1.2 (ref 0.7–1.7)
Albumin ELP: 4.2 g/dL (ref 2.9–4.4)
Alpha-1-Globulin: 0.3 g/dL (ref 0.0–0.4)
Alpha-2-Globulin: 0.8 g/dL (ref 0.4–1.0)
Beta Globulin: 1.1 g/dL (ref 0.7–1.3)
Gamma Globulin: 1.2 g/dL (ref 0.4–1.8)
Globulin, Total: 3.5 g/dL (ref 2.2–3.9)
Total Protein ELP: 7.7 g/dL (ref 6.0–8.5)

## 2019-08-26 LAB — ERYTHROPOIETIN: Erythropoietin: 8.5 m[IU]/mL (ref 2.6–18.5)

## 2019-08-30 ENCOUNTER — Other Ambulatory Visit: Payer: Self-pay | Admitting: Family Medicine

## 2019-09-02 LAB — CALR + JAK2 E12-15 + MPL (REFLEXED)

## 2019-09-02 LAB — JAK2 V617F, W REFLEX TO CALR/E12/MPL

## 2019-09-08 ENCOUNTER — Ambulatory Visit (HOSPITAL_COMMUNITY): Payer: PPO | Admitting: Hematology

## 2019-09-10 ENCOUNTER — Other Ambulatory Visit: Payer: Self-pay | Admitting: Family Medicine

## 2019-09-17 ENCOUNTER — Other Ambulatory Visit: Payer: Self-pay | Admitting: Family Medicine

## 2019-09-22 ENCOUNTER — Other Ambulatory Visit: Payer: Self-pay | Admitting: Family Medicine

## 2019-09-23 ENCOUNTER — Other Ambulatory Visit: Payer: Self-pay

## 2019-09-28 ENCOUNTER — Telehealth: Payer: Self-pay

## 2019-09-28 NOTE — Telephone Encounter (Signed)
Pts Sp LVM that he needs to know about the pts Xanax & Pain Medication

## 2019-09-28 NOTE — Telephone Encounter (Signed)
Patients spouse stated since you were out patient hasn't gotten xanax or pain medication.

## 2019-10-03 ENCOUNTER — Other Ambulatory Visit: Payer: Self-pay | Admitting: Family Medicine

## 2019-10-03 NOTE — Telephone Encounter (Signed)
That is correct and I do not intend to prescribe those medications for her anymore, I do not think she needs or benfits from them and there are alternative medications and Specialists who can treat both IF she requires treatment,I am happy to refer her to a Specilaist if requested, please let him know

## 2019-10-04 NOTE — Telephone Encounter (Signed)
Noted  

## 2019-10-04 NOTE — Telephone Encounter (Signed)
Left message for return call to discuss providers reply regarding pain medication and xanax.

## 2019-10-04 NOTE — Telephone Encounter (Signed)
Spoke with spouse and advised him that Dr.Simpson will no longer be prescribing the xanax or pain medication for the patient. I explained the reason behind her decision. I also advised that she could refer her to a specialist who would take care of those if they would like. He stated that he is with her all of the time and he feels like she does need them and they will have to figure something else out. He just isn't confident in Dr.Simpson anymore. He said thank you and that was the end of the conversation.

## 2019-10-04 NOTE — Telephone Encounter (Signed)
Pts sp is returning call to you

## 2019-11-11 ENCOUNTER — Ambulatory Visit: Payer: PPO | Admitting: Family Medicine

## 2019-11-12 ENCOUNTER — Other Ambulatory Visit: Payer: Self-pay

## 2019-11-12 ENCOUNTER — Encounter: Payer: PPO | Admitting: Family Medicine

## 2019-11-16 ENCOUNTER — Encounter: Payer: Self-pay | Admitting: Internal Medicine

## 2019-11-16 ENCOUNTER — Ambulatory Visit: Payer: PPO | Admitting: Gastroenterology

## 2019-11-16 NOTE — Progress Notes (Deleted)
Primary Care Physician: Fayrene Helper, MD  Primary Gastroenterologist:  Elon Alas. Abbey Chatters, DO   No chief complaint on file.   HPI: Carla [name] is a 74 y.o. female here for follow-up.  Last seen in February 2021.  History of bilateral lower abdominal pain.  She also has a history of serrated adenoma in the colon, due for colonoscopy at this time.  CT abdomen pelvis without contrast March 2021: Tiny nonobstructing bilateral renal calculi.  Colonic diverticulosis.  Large stool burden again noted.  Current Outpatient Medications  Medication Sig Dispense Refill  . alendronate (FOSAMAX) 70 MG tablet TAKE 1 TABLET EACH WEEK 30 MIN BEFORE BREAKFAST WITH 8 OUNCES OF WATER FOR OSTEOPOROSIS. 4 tablet 4  . ALPRAZolam (XANAX) 0.25 MG tablet Take 1 tablet (0.25 mg total) by mouth at bedtime as needed for anxiety. (Patient not taking: Reported on 08/25/2019) 30 tablet 0  . amLODipine (NORVASC) 10 MG tablet TAKE ONE TABLET BY MOUTH ONCE DAILY. (Patient taking differently: Take 10 mg by mouth at bedtime. ) 90 tablet 0  . aspirin EC 81 MG tablet Take 81 mg by mouth at bedtime.     . gabapentin (NEURONTIN) 100 MG capsule TAKE (2) CAPSULES BY MOUTH AT BEDTIME. (Patient taking differently: Take 100 mg by mouth at bedtime. ) 60 capsule 0  . HYDROcodone-acetaminophen (NORCO/VICODIN) 5-325 MG tablet Take one tablet by mouth daily for pain (Patient not taking: Reported on 08/25/2019) 30 tablet 0  . hydrocortisone (ANUSOL-HC) 2.5 % rectal cream APPLY SPARINGLY TO THE VAGINA AREA TWICE WEEKLY AS NEEDED FOR ITCHING. (Patient not taking: Reported on 08/25/2019) 30 g 0  . memantine (NAMENDA) 10 MG tablet Take 1 tablet (10 mg total) by mouth daily. 30 tablet 2  . montelukast (SINGULAIR) 10 MG tablet TAKE (1) TABLET BY MOUTH AT BEDTIME. 30 tablet 0  . Multiple Vitamin (MULTIVITAMIN WITH MINERALS) TABS Take 1 tablet by mouth every evening.     . potassium chloride SA (KLOR-CON) 20 MEQ tablet Take 1 tablet (20  mEq total) by mouth 2 (two) times daily. 14 tablet 0  . rosuvastatin (CRESTOR) 40 MG tablet Take 1 tablet (40 mg total) by mouth daily. 90 tablet 3  . traMADol (ULTRAM) 50 MG tablet Take 1 tablet (50 mg total) by mouth every 6 (six) hours as needed. 20 tablet 0   No current facility-administered medications for this visit.    Allergies as of 11/16/2019 - Review Complete 08/25/2019  Allergen Reaction Noted  . Aricept [donepezil hcl]  07/18/2017  . Calcium-containing compounds Other (See Comments) 04/23/2014  . Exelon [rivastigmine tartrate] Rash 06/29/2018    ROS:  General: Negative for anorexia, weight loss, fever, chills, fatigue, weakness. ENT: Negative for hoarseness, difficulty swallowing , nasal congestion. CV: Negative for chest pain, angina, palpitations, dyspnea on exertion, peripheral edema.  Respiratory: Negative for dyspnea at rest, dyspnea on exertion, cough, sputum, wheezing.  GI: See history of present illness. GU:  Negative for dysuria, hematuria, urinary incontinence, urinary frequency, nocturnal urination.  Endo: Negative for unusual weight change.    Physical Examination:   There were no vitals taken for this visit.  General: Well-nourished, well-developed in no acute distress.  Eyes: No icterus. Mouth: Oropharyngeal mucosa moist and pink , no lesions erythema or exudate. Lungs: Clear to auscultation bilaterally.  Heart: Regular rate and rhythm, no murmurs rubs or gallops.  Abdomen: Bowel sounds are normal, nontender, nondistended, no hepatosplenomegaly or masses, no abdominal bruits or hernia ,  no rebound or guarding.   Extremities: No lower extremity edema. No clubbing or deformities. Neuro: Alert and oriented x 4   Skin: Warm and dry, no jaundice.   Psych: Alert and cooperative, normal mood and affect.  Labs:  Lab Results  Component Value Date   WBC 9.4 08/25/2019   HGB 17.5 (H) 08/25/2019   HCT 53.5 (H) 08/25/2019   MCV 96.4 08/25/2019   PLT 287  08/25/2019   Lab Results  Component Value Date   CREATININE 0.90 08/25/2019   BUN 14 08/25/2019   NA 140 08/25/2019   K 3.2 (L) 08/25/2019   CL 101 08/25/2019   CO2 26 08/25/2019   Lab Results  Component Value Date   ALT 15 08/25/2019   AST 22 08/25/2019   ALKPHOS 95 08/25/2019   BILITOT 0.8 08/25/2019   Lab Results  Component Value Date   TSH 1.77 04/21/2019   No results found for: IRON, TIBC, FERRITIN Lab Results  Component Value Date   [id number] 08/25/2019   No results found for: FOLATE   Imaging Studies: No results found.

## 2019-12-21 ENCOUNTER — Other Ambulatory Visit: Payer: Self-pay | Admitting: Family Medicine

## 2020-01-07 ENCOUNTER — Other Ambulatory Visit: Payer: Self-pay | Admitting: Family Medicine

## 2020-01-20 ENCOUNTER — Other Ambulatory Visit: Payer: Self-pay | Admitting: Family Medicine

## 2020-01-20 DIAGNOSIS — G3184 Mild cognitive impairment, so stated: Secondary | ICD-10-CM

## 2020-02-24 ENCOUNTER — Telehealth (INDEPENDENT_AMBULATORY_CARE_PROVIDER_SITE_OTHER): Payer: PPO | Admitting: Nurse Practitioner

## 2020-02-24 ENCOUNTER — Encounter: Payer: Self-pay | Admitting: Nurse Practitioner

## 2020-02-24 DIAGNOSIS — Z0000 Encounter for general adult medical examination without abnormal findings: Secondary | ICD-10-CM

## 2020-02-24 NOTE — Progress Notes (Signed)
Subjective:   Carla [name] is a 74 y.o. female who presents for Medicare Annual (Subsequent) preventive examination.        Objective:    There were no vitals filed for this visit. There is no height or weight on file to calculate BMI.  Advanced Directives 08/25/2019 04/27/2019 02/07/2019 09/22/2018 09/21/2018 02/20/2018 11/03/2017  Does Patient Have a Medical Advance Directive? No No No No No No No  Would patient like information on creating a medical advance directive? No - Patient declined No - Patient declined - No - Patient declined - - Yes (ED - Information included in AVS)  Pre-existing out of facility DNR order (yellow form or pink MOST form) - - - - - - -    Current Medications (verified) Outpatient Encounter Medications as of 02/24/2020  Medication Sig  . alendronate (FOSAMAX) 70 MG tablet TAKE 1 TABLET EACH WEEK 30 MIN BEFORE BREAKFAST WITH 8 OUNCES OF WATER FOR OSTEOPOROSIS.  Marland Kitchen ALPRAZolam (XANAX) 0.25 MG tablet Take 1 tablet (0.25 mg total) by mouth at bedtime as needed for anxiety. (Patient not taking: Reported on 08/25/2019)  . amLODipine (NORVASC) 10 MG tablet TAKE ONE TABLET BY MOUTH ONCE DAILY.  Marland Kitchen aspirin EC 81 MG tablet Take 81 mg by mouth at bedtime.   . gabapentin (NEURONTIN) 100 MG capsule TAKE (2) CAPSULES BY MOUTH AT BEDTIME. (Patient taking differently: Take 100 mg by mouth at bedtime. )  . HYDROcodone-acetaminophen (NORCO/VICODIN) 5-325 MG tablet Take one tablet by mouth daily for pain (Patient not taking: Reported on 08/25/2019)  . hydrocortisone (ANUSOL-HC) 2.5 % rectal cream APPLY SPARINGLY TO THE VAGINA AREA TWICE WEEKLY AS NEEDED FOR ITCHING. (Patient not taking: Reported on 08/25/2019)  . memantine (NAMENDA) 10 MG tablet Take 1 tablet (10 mg total) by mouth daily.  . montelukast (SINGULAIR) 10 MG tablet TAKE (1) TABLET BY MOUTH AT BEDTIME.  . Multiple Vitamin (MULTIVITAMIN WITH MINERALS) TABS Take 1 tablet by mouth every evening.   . potassium chloride SA  (KLOR-CON) 20 MEQ tablet Take 1 tablet (20 mEq total) by mouth 2 (two) times daily.  . rosuvastatin (CRESTOR) 40 MG tablet TAKE 1 TABLET BY MOUTH ONCE A DAY.  . traMADol (ULTRAM) 50 MG tablet Take 1 tablet (50 mg total) by mouth every 6 (six) hours as needed.   No facility-administered encounter medications on file as of 02/24/2020.    Allergies (verified) Aricept [donepezil hcl], Calcium-containing compounds, and Exelon [rivastigmine tartrate]   History: Past Medical History:  Diagnosis Date  . Anxiety   . ASCVD (arteriosclerotic cardiovascular disease)    No critical disease on cath in 8/97: mild AI with trival, if any l AS; negative stress nuclear in 2010  . Atrophic vaginitis 04/18/2016  . Back pain   . Cervical disc herniation   . Chronic bronchitis   . Colon polyps   . De Quervain's disease (radial styloid tenosynovitis) 10/14/2016  . Depression   . Elevated hemoglobin A1c   . Fasting hyperglycemia   . GERD (gastroesophageal reflux disease)   . Heart disease   . HSV (herpes simplex virus) infection   . Hydronephrosis   . Hypercalcemia 12/30/2008   Qualifier: Diagnosis of  By: Cori Razor LPN, Brandi    . Hypercholesteremia   . Hypertension   . Neck muscle spasm 04/23/2014  . Onychomycosis 07/08/2015  . Osteoporosis   . Peripheral vascular disease ([place])    stauts post aortabifemoral graft    . Secondary erythrocytosis 08/05/2012  Secondary to COPD  . Tobacco abuse    has stopped  . 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 ABDOMINAL HYSTERECTOMY    . aorta bifem graft    . CHOLECYSTECTOMY    . COLONOSCOPY  06/23/2006   Dr. Fields:Normal colon without evidence of polyps, masses, inflammatory changes/Normal retroflex view of the rectum  . COLONOSCOPY N/A 08/24/2013   Procedure: COLONOSCOPY;  Surgeon: Danie Binder, MD;  Location: [facility];  Service: Endoscopy;  Laterality: N/A;  10:30-moved to [employer] notified pt   . lysis of adhesions  2000  . PARTIAL HYSTERECTOMY    . right kidney surgery following damage during arterial surgery    . TOTAL ABDOMINAL HYSTERECTOMY W/ BILATERAL SALPINGOOPHORECTOMY  1975   Family History  Problem Relation Age of Onset  . Heart attack Father   . COPD Sister   . Heart disease Sister   . Diabetes Sister   . Coronary artery disease Sister   . Liver cancer Sister 69  . Diabetes Sister   . Diabetes Sister   . COPD Sister   . Heart attack Mother   . COPD Brother   . Heart attack Son   . Colon cancer Neg Hx    Social History   Socioeconomic History  . Marital status: Married    Spouse name: Not on file  . Number of children: 3  . Years of education: Not on file  . Highest education level: Not on file  Occupational History  . Occupation: disabled   Tobacco Use  . Smoking status: Current Every Day Smoker    Packs/day: 0.50    Years: 50.00    Pack years: 25.00    Types: Cigarettes    Last attempt to quit: 12/12/2010    Years since quitting: 9.2  . Smokeless tobacco: Never Used  Vaping Use  . Vaping Use: Never used  Substance and Sexual Activity  . Alcohol use: No  . Drug use: No  . Sexual activity: Not Currently    Birth control/protection: Surgical  Other Topics Concern  . Not on file  Social History Narrative   2 Children living 1 deceased    Social Determinants of Health   Financial Resource Strain:   . Difficulty of Paying Living Expenses: Not on file  Food Insecurity:   . Worried About Charity fundraiser in the Last Year: Not on file  . Ran Out of Food in the Last Year: Not on file  Transportation Needs:   . Lack of Transportation (Medical): Not on file  . Lack of Transportation (Non-Medical): Not on file  Physical Activity:   . Days of Exercise per Week: Not on file  . Minutes of Exercise per Session: Not on file  Stress:   . Feeling of Stress : Not on file  Social Connections:   . Frequency of Communication with Friends and  Family: Not on file  . Frequency of Social Gatherings with Friends and Family: Not on file  . Attends Religious Services: Not on file  . Active Member of Clubs or Organizations: Not on file  . Attends Archivist Meetings: Not on file  . Marital Status: Not on file    Tobacco Counseling Ready to quit: Not Answered Counseling given: Not Answered   Clinical Intake:                 Diabetic? No  Activities of Daily Living No flowsheet data found.  Patient Care Team: Fayrene Helper, MD as PCP - General Fields, Marga Melnick, MD (Inactive) as Consulting Physician (Gastroenterology)  Indicate any recent Medical Services you may have received from other than [facility] providers in the past year (date may be approximate).     Assessment:   This is a routine wellness examination for Carla [name].  Hearing/Vision screen No exam data present  Dietary issues and exercise activities discussed:    Goals    . Exercise 3x per week (30 min per time)     Recommend starting a routine exercise program at least 3 days a week for 30-45 minutes at a time as tolerated.        Depression Screen PHQ 2/9 Scores 08/11/2019 04/21/2019 01/19/2019 11/11/2018 10/27/2018 09/21/2018 03/04/2018  PHQ - 2 Score 6 2 1 1 2  0 0  PHQ- 9 Score 12 12 - - 8 - -    Fall Risk Fall Risk  08/11/2019 04/21/2019 01/19/2019 11/11/2018 10/27/2018  Falls in the past year? 0 0 0 0 0  Number falls in past yr: 0 0 0 0 -  Injury with Fall? 0 0 0 0 0  Risk for fall due to : No Fall Risks - - - -  Follow up Falls evaluation completed - - - -    Any stairs in or around the home? No  If so, are there any without handrails? No  Home free of loose throw rugs in walkways, pet beds, electrical cords, etc? No  Adequate lighting in your home to reduce risk of falls? Yes   ASSISTIVE DEVICES UTILIZED TO PREVENT FALLS:  Life alert? No  Use of a cane, walker or w/c? No  Grab bars in the bathroom? No  Shower  chair or bench in shower? No  Elevated toilet seat or a handicapped toilet? No   TIMED UP AND GO:  Was the test performed? No .     Cognitive Function: MMSE - Mini Mental State Exam 08/11/2019 06/16/2019 01/23/2017 10/30/2015 07/06/2015  Orientation to time 0 2 2 4 5   Orientation to Place 5 2 5 5 5   Registration 3 3 3 3 3   Attention/ Calculation 5 4 5 5 5   Recall 0 0 0 0 0  Language- name 2 objects 2 2 2 2 2   Language- repeat 1 1 1 1 1   Language- follow 3 step command 3 3 3 3 3   Language- read & follow direction 1 1 1 1 1   Write a sentence 1 1 1 1 1   Copy design 1 0 0 1 0  Total score 22 19 23 26 26      6CIT Screen 11/11/2018 11/03/2017 11/03/2017 12/11/2016  What Year? 4 points 4 points 4 points 0 points  What month? 3 points 3 points 3 points 0 points  What time? 0 points 0 points 0 points 0 points  Count back from 20 0 points 2 points - 0 points  Months in reverse 0 points 4 points 4 points 0 points  Repeat phrase 0 points 10 points - 0 points  Total Score 7 23 - 0    Immunizations Immunization History  Administered Date(s) Administered  . Fluad Quad(high Dose 65+) 01/19/2019  . Influenza Split 03/11/2011, 01/23/2012  . Influenza Whole 01/23/2007, 12/28/2008, 01/17/2010  . Influenza, High Dose Seasonal PF 12/30/2017  . Influenza,inj,Quad PF,6+ Mos 01/05/2013, 02/10/2014, 12/15/2014, 12/11/2016  . Pneumococcal Conjugate-13 04/20/2014  . Pneumococcal Polysaccharide-23 11/19/2010  .  Td 04/05/2003  . Tdap 12/22/2013  . Zoster 11/19/2010    TDAP status: Up to date Flu Vaccine status: Up to date Pneumococcal vaccine status: Up to date Covid-19 vaccine status: Declined, Education has been provided regarding the importance of this vaccine but patient still declined. Advised may receive this vaccine at local pharmacy or Health Dept.or vaccine clinic. Aware to provide a copy of the vaccination record if obtained from local pharmacy or Health Dept. Verbalized acceptance and  understanding.  Qualifies for Shingles Vaccine? Yes   Zostavax completed No   Shingrix Completed?: No.    Education has been provided regarding the importance of this vaccine. Patient has been advised to call insurance company to determine out of pocket expense if they have not yet received this vaccine. Advised may also receive vaccine at local pharmacy or Health Dept. Verbalized acceptance and understanding.  Screening Tests Health Maintenance  Topic Date Due  . COVID-19 Vaccine (1) Never done  . HEMOGLOBIN A1C  10/19/2019  . INFLUENZA VACCINE  10/24/2019  . MAMMOGRAM  04/29/2021  . COLONOSCOPY  08/25/2023  . TETANUS/TDAP  12/23/2023  . DEXA SCAN  Completed  . Hepatitis C Screening  Completed  . PNA vac Low Risk Adult  Completed  . FOOT EXAM  Discontinued  . OPHTHALMOLOGY EXAM  Discontinued  . URINE MICROALBUMIN  Discontinued    Health Maintenance  Health Maintenance Due  Topic Date Due  . COVID-19 Vaccine (1) Never done  . HEMOGLOBIN A1C  10/19/2019  . INFLUENZA VACCINE  10/24/2019    Colorectal cancer screening: Completed normal. Repeat every 5 years Mammogram status: Completed normal. Repeat every year Bone Density: Completed  Lung Cancer Screening: (Low Dose CT Chest recommended if Age 5-80 years, 30 pack-year currently smoking OR have quit w/in 15years.) does not qualify.     Additional Screening:  Hepatitis C Screening: does qualify; Completed.  Vision Screening: Recommended annual ophthalmology exams for early detection of glaucoma and other disorders of the eye. Is the patient up to date with their annual eye exam?  Yes  Who is the provider or what is the name of the office in which the patient attends annual eye exams? [facility]   If pt is not established with a provider, would they like to be referred to a provider to establish care? Yes .   Dental Screening: Recommended annual dental exams for proper oral hygiene  Community Resource Referral /  Chronic Care Management: CRR required this visit?  No   CCM required this visit?  No      Plan:     I have personally reviewed and noted the following in the patient's chart:   . Medical and social history . Use of alcohol, tobacco or illicit drugs  . Current medications and supplements . Functional ability and status . Nutritional status . Physical activity . Advanced directives . List of other physicians . Hospitalizations, surgeries, and ER visits in previous 12 months . Vitals . Screenings to include cognitive, depression, and falls . Referrals and appointments  In addition, I have reviewed and discussed with patient certain preventive protocols, quality metrics, and best practice recommendations. A written personalized care plan for preventive services as well as general preventive health recommendations were provided to patient.     Lonn Georgia, LPN   [id number]   Nurse Notes: AWV conducted over the phone with pt consent to televisit via audio. Pt was in the home at the time of visit and  provider was present in the office. Call took approx 20 min.    Date:  02/24/2020   Location of Patient: Home Location of Provider: Office Consent was obtain for visit to be over via telehealth. I verified that I am speaking with the correct person using two identifiers.  I connected with  Carla [name] on 02/24/20 via telephone and verified that I am speaking with the correct person using two identifiers.   I discussed the limitations of evaluation and management by telemedicine. The patient expressed understanding and agreed to proceed.  AWV questions answered by Lonn Georgia, LPN.  I called the patient to confirm the responses to the AWV questions and addressed all concerns.

## 2020-02-24 NOTE — Patient Instructions (Addendum)
Carla [name] , Thank you for taking time to come for your Medicare Wellness Visit. I appreciate your ongoing commitment to your health goals. Please review the following plan we discussed and let me know if I can assist you in the future.   Screening recommendations/referrals: Colonoscopy: Completed; DUE 08/25/2023 Mammogram: Completed; DUE 04/29/2021  Bone Density: Completed  Recommended yearly ophthalmology/optometry visit for glaucoma screening and checkup Recommended yearly dental visit for hygiene and checkup  Vaccinations: Influenza vaccine: DUE education provided.  Pneumococcal vaccine: Completed  Tdap vaccine: Completed; DUE 11/26/2023 Shingles vaccine: Education provided.    Advanced directives: Living Will   Conditions/risks identified: Re-evaluate MMSE; 6CIT at next visit.   Next appointment: 03/21/2020 @ 1:00pm with Dr. Moshe Cipro    Preventive Care 65 Years and Older, Female Preventive care refers to lifestyle choices and visits with your health care provider that can promote health and wellness. What does preventive care include?  A yearly physical exam. This is also called an annual well check.  Dental exams once or twice a year.  Routine eye exams. Ask your health care provider how often you should have your eyes checked.  Personal lifestyle choices, including:  Daily care of your teeth and gums.  Regular physical activity.  Eating a healthy diet.  Avoiding tobacco and drug use.  Limiting alcohol use.  Practicing safe sex.  Taking low-dose aspirin every day.  Taking vitamin and mineral supplements as recommended by your health care provider. What happens during an annual well check? The services and screenings done by your health care provider during your annual well check will depend on your age, overall health, lifestyle risk factors, and family history of disease. Counseling  Your health care provider may ask you questions about your:  Alcohol  use.  Tobacco use.  Drug use.  Emotional well-being.  Home and relationship well-being.  Sexual activity.  Eating habits.  History of falls.  Memory and ability to understand (cognition).  Work and work Statistician.  Reproductive health. Screening  You may have the following tests or measurements:  Height, weight, and BMI.  Blood pressure.  Lipid and cholesterol levels. These may be checked every 5 years, or more frequently if you are over 73 years old.  Skin check.  Lung cancer screening. You may have this screening every year starting at age 62 if you have a 30-pack-year history of smoking and currently smoke or have quit within the past 15 years.  Fecal occult blood test (FOBT) of the stool. You may have this test every year starting at age 27.  Flexible sigmoidoscopy or colonoscopy. You may have a sigmoidoscopy every 5 years or a colonoscopy every 10 years starting at age 57.  Hepatitis C blood test.  Hepatitis B blood test.  Sexually transmitted disease (STD) testing.  Diabetes screening. This is done by checking your blood sugar (glucose) after you have not eaten for a while (fasting). You may have this done every 1-3 years.  Bone density scan. This is done to screen for osteoporosis. You may have this done starting at age 42.  Mammogram. This may be done every 1-2 years. Talk to your health care provider about how often you should have regular mammograms. Talk with your health care provider about your test results, treatment options, and if necessary, the need for more tests. Vaccines  Your health care provider may recommend certain vaccines, such as:  Influenza vaccine. This is recommended every year.  Tetanus, diphtheria, and acellular pertussis (Tdap, Td)  vaccine. You may need a Td booster every 10 years.  Zoster vaccine. You may need this after age 15.  Pneumococcal 13-valent conjugate (PCV13) vaccine. One dose is recommended after age  65.  Pneumococcal polysaccharide (PPSV23) vaccine. One dose is recommended after age 48. Talk to your health care provider about which screenings and vaccines you need and how often you need them. This information is not intended to replace advice given to you by your health care provider. Make sure you discuss any questions you have with your health care provider. Document Released: 04/07/2015 Document Revised: 11/29/2015 Document Reviewed: 01/10/2015 Elsevier Interactive Patient Education  2017 [place] Prevention in the Home Falls can cause injuries. They can happen to people of all ages. There are many things you can do to make your home safe and to help prevent falls. What can I do on the outside of my home?  Regularly fix the edges of walkways and driveways and fix any cracks.  Remove anything that might make you trip as you walk through a door, such as a raised step or threshold.  Trim any bushes or trees on the path to your home.  Use bright outdoor lighting.  Clear any walking paths of anything that might make someone trip, such as rocks or tools.  Regularly check to see if handrails are loose or broken. Make sure that both sides of any steps have handrails.  Any raised decks and porches should have guardrails on the edges.  Have any leaves, snow, or ice cleared regularly.  Use sand or salt on walking paths during winter.  Clean up any spills in your garage right away. This includes oil or grease spills. What can I do in the bathroom?  Use night lights.  Install grab bars by the toilet and in the tub and shower. Do not use towel bars as grab bars.  Use non-skid mats or decals in the tub or shower.  If you need to sit down in the shower, use a plastic, non-slip stool.  Keep the floor dry. Clean up any water that spills on the floor as soon as it happens.  Remove soap buildup in the tub or shower regularly.  Attach bath mats securely with double-sided  non-slip rug tape.  Do not have throw rugs and other things on the floor that can make you trip. What can I do in the bedroom?  Use night lights.  Make sure that you have a light by your bed that is easy to reach.  Do not use any sheets or blankets that are too big for your bed. They should not hang down onto the floor.  Have a firm chair that has side arms. You can use this for support while you get dressed.  Do not have throw rugs and other things on the floor that can make you trip. What can I do in the kitchen?  Clean up any spills right away.  Avoid walking on wet floors.  Keep items that you use a lot in easy-to-reach places.  If you need to reach something above you, use a strong step stool that has a grab bar.  Keep electrical cords out of the way.  Do not use floor polish or wax that makes floors slippery. If you must use wax, use non-skid floor wax.  Do not have throw rugs and other things on the floor that can make you trip. What can I do with my stairs?  Do not leave  any items on the stairs.  Make sure that there are handrails on both sides of the stairs and use them. Fix handrails that are broken or loose. Make sure that handrails are as long as the stairways.  Check any carpeting to make sure that it is firmly attached to the stairs. Fix any carpet that is loose or worn.  Avoid having throw rugs at the top or bottom of the stairs. If you do have throw rugs, attach them to the floor with carpet tape.  Make sure that you have a light switch at the top of the stairs and the bottom of the stairs. If you do not have them, ask someone to add them for you. What else can I do to help prevent falls?  Wear shoes that:  Do not have high heels.  Have rubber bottoms.  Are comfortable and fit you well.  Are closed at the toe. Do not wear sandals.  If you use a stepladder:  Make sure that it is fully opened. Do not climb a closed stepladder.  Make sure that both  sides of the stepladder are locked into place.  Ask someone to hold it for you, if possible.  Clearly mark and make sure that you can see:  Any grab bars or handrails.  First and last steps.  Where the edge of each step is.  Use tools that help you move around (mobility aids) if they are needed. These include:  Canes.  Walkers.  Scooters.  Crutches.  Turn on the lights when you go into a dark area. Replace any light bulbs as soon as they burn out.  Set up your furniture so you have a clear path. Avoid moving your furniture around.  If any of your floors are uneven, fix them.  If there are any pets around you, be aware of where they are.  Review your medicines with your doctor. Some medicines can make you feel dizzy. This can increase your chance of falling. Ask your doctor what other things that you can do to help prevent falls. This information is not intended to replace advice given to you by your health care provider. Make sure you discuss any questions you have with your health care provider. Document Released: 01/05/2009 Document Revised: 08/17/2015 Document Reviewed: 04/15/2014 Elsevier Interactive Patient Education  2017 [employer].

## 2020-03-21 ENCOUNTER — Ambulatory Visit: Payer: PPO | Admitting: Family Medicine

## 2020-03-22 ENCOUNTER — Ambulatory Visit: Payer: PPO | Admitting: Nurse Practitioner

## 2020-04-05 ENCOUNTER — Telehealth (INDEPENDENT_AMBULATORY_CARE_PROVIDER_SITE_OTHER): Payer: Medicare HMO | Admitting: Family Medicine

## 2020-04-05 ENCOUNTER — Other Ambulatory Visit: Payer: Self-pay

## 2020-04-05 ENCOUNTER — Other Ambulatory Visit (HOSPITAL_COMMUNITY): Payer: Self-pay | Admitting: Family Medicine

## 2020-04-05 ENCOUNTER — Encounter: Payer: Self-pay | Admitting: Family Medicine

## 2020-04-05 DIAGNOSIS — E559 Vitamin D deficiency, unspecified: Secondary | ICD-10-CM | POA: Diagnosis not present

## 2020-04-05 DIAGNOSIS — R413 Other amnesia: Secondary | ICD-10-CM | POA: Diagnosis not present

## 2020-04-05 DIAGNOSIS — F17218 Nicotine dependence, cigarettes, with other nicotine-induced disorders: Secondary | ICD-10-CM

## 2020-04-05 DIAGNOSIS — Z1231 Encounter for screening mammogram for malignant neoplasm of breast: Secondary | ICD-10-CM | POA: Diagnosis not present

## 2020-04-05 DIAGNOSIS — E785 Hyperlipidemia, unspecified: Secondary | ICD-10-CM

## 2020-04-05 DIAGNOSIS — I10 Essential (primary) hypertension: Secondary | ICD-10-CM | POA: Diagnosis not present

## 2020-04-05 DIAGNOSIS — G309 Alzheimer's disease, unspecified: Secondary | ICD-10-CM | POA: Diagnosis not present

## 2020-04-05 NOTE — Patient Instructions (Addendum)
Annual physical exam in office with MD in 3  Months, call if you need me sooner  Please schedule mammogram at checkout  You are referred for a brain scan  Flu vaccine in office tomorrow afternoon  Please get fasting lipid, cmp and EGFR CBc and TSH on 04/06/2020  Please work on quitting smoking again  You are referred to Dr Merlene Laughter re memory loss  Be careful not to fall  Thanks for choosing [facility], we consider it a privelige to serve you.

## 2020-04-05 NOTE — Assessment & Plan Note (Signed)
Hyperlipidemia:Low fat diet discussed and encouraged.   Lipid Panel  Lab Results  Component Value Date   CHOL 160 04/21/2019   HDL 62 04/21/2019   LDLCALC 72 04/21/2019   LDLDIRECT 166 (H) 10/11/2009   TRIG 191 (H) 04/21/2019   CHOLHDL 2.6 04/21/2019     Updated lab needed at/ before next visit.

## 2020-04-05 NOTE — Progress Notes (Signed)
Virtual Visit via Telephone Note  I connected with Carla [name] on 04/05/20 at  2:20 PM EST by telephone and verified that I am speaking with the correct person using two identifiers.  Location: Patient: home Provider:work   I discussed the limitations, risks, security and privacy concerns of performing an evaluation and management service by telephone and the availability of in person appointments. I also discussed with the patient that there may be a patient responsible charge related to this service. The patient expressed understanding and agreed to proceed.   History of Present Illness: States she needs medication for memory over past 5 to 6 months, states she puts things up and can't find them, spouse also involved in visit and his main concern is her loss of memory. He is interested in and wants Neurology input C/o bilateral leg pain , unchanged Smoking again and no plan to quit at this time, states smoking a light cigarette No other concerns voiced at visit   Observations/Objective: There were no vitals taken for this visit.  Fair  communication unable to state the current yearNo signs of respiratory distress during speech    Assessment and Plan: Nicotine addiction Asked:confirms currently smokes cigarettes Assess: Unwilling to set a quit date,and not currently committing to the desire to cut back Advise: needs to QUIT to reduce risk of cancer, cardio and cerebrovascular disease Assist: counseled for 5 minutes and literature provided Arrange: follow up in 2 to 4 months   Memory loss Reportedly worsening and of great concern per spouse . Currently on single agent, intolerance documented in the past to aricept and exelon patch . Refer to Neurology for eval and management, obtain brain scan  Hyperlipidemia LDL goal <100 Hyperlipidemia:Low fat diet discussed and encouraged.   Lipid Panel  Lab Results  Component Value Date   CHOL 160 04/21/2019   HDL 62 04/21/2019    LDLCALC 72 04/21/2019   LDLDIRECT 166 (H) 10/11/2009   TRIG 191 (H) 04/21/2019   CHOLHDL 2.6 04/21/2019     Updated lab needed at/ before next visit.     Follow Up Instructions:    I discussed the assessment and treatment plan with the patient. The patient was provided an opportunity to ask questions and all were answered. The patient agreed with the plan and demonstrated an understanding of the instructions.   The patient was advised to call back or seek an in-person evaluation if the symptoms worsen or if the condition fails to improve as anticipated.  I provided 25 minutes of non-face-to-face time during this encounter.   Tula Nakayama, MD

## 2020-04-05 NOTE — Assessment & Plan Note (Addendum)
Reportedly worsening and of great concern per spouse . Currently on single agent, intolerance documented in the past to aricept and exelon patch . Refer to Neurology for eval and management, obtain brain scan

## 2020-04-05 NOTE — Addendum Note (Signed)
Addended by: Eual Fines on: 04/05/2020 03:31 PM   Modules accepted: Orders

## 2020-04-05 NOTE — Assessment & Plan Note (Signed)
Asked:confirms currently smokes cigarettes Assess: Unwilling to set a quit date,and not currently committing to the desire to cut back Advise: needs to QUIT to reduce risk of cancer, cardio and cerebrovascular disease Assist: counseled for 5 minutes and literature provided Arrange: follow up in 2 to 4 months

## 2020-04-06 ENCOUNTER — Ambulatory Visit (INDEPENDENT_AMBULATORY_CARE_PROVIDER_SITE_OTHER): Payer: Medicare HMO

## 2020-04-06 ENCOUNTER — Other Ambulatory Visit: Payer: Self-pay

## 2020-04-06 DIAGNOSIS — Z23 Encounter for immunization: Secondary | ICD-10-CM

## 2020-04-06 DIAGNOSIS — E785 Hyperlipidemia, unspecified: Secondary | ICD-10-CM | POA: Diagnosis not present

## 2020-04-06 DIAGNOSIS — I10 Essential (primary) hypertension: Secondary | ICD-10-CM | POA: Diagnosis not present

## 2020-04-06 DIAGNOSIS — R7303 Prediabetes: Secondary | ICD-10-CM | POA: Diagnosis not present

## 2020-04-07 ENCOUNTER — Other Ambulatory Visit: Payer: Self-pay | Admitting: Family Medicine

## 2020-04-07 DIAGNOSIS — D72829 Elevated white blood cell count, unspecified: Secondary | ICD-10-CM

## 2020-04-07 DIAGNOSIS — D582 Other hemoglobinopathies: Secondary | ICD-10-CM

## 2020-04-07 LAB — CBC
Hematocrit: 55.2 % — ABNORMAL HIGH (ref 34.0–46.6)
Hemoglobin: 18.5 g/dL — ABNORMAL HIGH (ref 11.1–15.9)
MCH: 31.6 pg (ref 26.6–33.0)
MCHC: 33.5 g/dL (ref 31.5–35.7)
MCV: 94 fL (ref 79–97)
Platelets: 294 10*3/uL (ref 150–450)
RBC: 5.86 x10E6/uL — ABNORMAL HIGH (ref 3.77–5.28)
RDW: 12.4 % (ref 11.7–15.4)
WBC: 11.6 10*3/uL — ABNORMAL HIGH (ref 3.4–10.8)

## 2020-04-07 LAB — CMP14+EGFR
ALT: 8 IU/L (ref 0–32)
AST: 12 IU/L (ref 0–40)
Albumin/Globulin Ratio: 1.7 (ref 1.2–2.2)
Albumin: 4.3 g/dL (ref 3.7–4.7)
Alkaline Phosphatase: 113 IU/L (ref 44–121)
BUN/Creatinine Ratio: 13 (ref 12–28)
BUN: 13 mg/dL (ref 8–27)
Bilirubin Total: 0.5 mg/dL (ref 0.0–1.2)
CO2: 24 mmol/L (ref 20–29)
Calcium: 10.4 mg/dL — ABNORMAL HIGH (ref 8.7–10.3)
Chloride: 100 mmol/L (ref 96–106)
Creatinine, Ser: 1 mg/dL (ref 0.57–1.00)
GFR calc Af Amer: 64 mL/min/{1.73_m2} (ref >59)
GFR calc non Af Amer: 55 mL/min/{1.73_m2} — ABNORMAL LOW (ref >59)
Globulin, Total: 2.5 g/dL (ref 1.5–4.5)
Glucose: 124 mg/dL — ABNORMAL HIGH (ref 65–99)
Potassium: 3.8 mmol/L (ref 3.5–5.2)
Sodium: 142 mmol/L (ref 134–144)
Total Protein: 6.8 g/dL (ref 6.0–8.5)

## 2020-04-07 LAB — LIPID PANEL
Chol/HDL Ratio: 2.3 ratio (ref 0.0–4.4)
Cholesterol, Total: 131 mg/dL (ref 100–199)
HDL: 56 mg/dL (ref >39)
LDL Chol Calc (NIH): 54 mg/dL (ref 0–99)
Triglycerides: 118 mg/dL (ref 0–149)
VLDL Cholesterol Cal: 21 mg/dL (ref 5–40)

## 2020-04-07 LAB — TSH: TSH: 0.808 u[IU]/mL (ref 0.450–4.500)

## 2020-04-15 ENCOUNTER — Other Ambulatory Visit: Payer: Self-pay | Admitting: Family Medicine

## 2020-04-21 ENCOUNTER — Ambulatory Visit (HOSPITAL_COMMUNITY): Admission: RE | Admit: 2020-04-21 | Payer: Medicare HMO | Source: Ambulatory Visit

## 2020-04-21 LAB — HGB A1C W/O EAG: Hgb A1c MFr Bld: 5.8 % — ABNORMAL HIGH (ref 4.8–5.6)

## 2020-04-21 LAB — SPECIMEN STATUS REPORT

## 2020-04-28 ENCOUNTER — Ambulatory Visit (HOSPITAL_COMMUNITY): Payer: Medicare HMO | Admitting: Hematology and Oncology

## 2020-04-30 ENCOUNTER — Other Ambulatory Visit: Payer: Self-pay | Admitting: Family Medicine

## 2020-05-01 ENCOUNTER — Ambulatory Visit (HOSPITAL_COMMUNITY): Payer: Medicare HMO

## 2020-05-22 ENCOUNTER — Telehealth: Payer: Self-pay

## 2020-05-22 NOTE — Telephone Encounter (Signed)
Called back to speak with Carla [name] and LVM to return call

## 2020-05-22 NOTE — Telephone Encounter (Signed)
Morey Hummingbird with Adult Protective  Services called and would like to speak with you regarding pt. She says she spoke with husband whom says she was taken off several medications several months ago, including her Xanax and every since then she has really been acting out of character. You can call her back at [phone] ext [phone]
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
## 2020-06-10 ENCOUNTER — Emergency Department (HOSPITAL_COMMUNITY)
Admission: EM | Admit: 2020-06-10 | Discharge: 2020-06-10 | Disposition: A | Discharge status: Home / Self Care | Payer: Medicare HMO | Attending: Emergency Medicine | Admitting: Emergency Medicine

## 2020-06-10 ENCOUNTER — Encounter (HOSPITAL_COMMUNITY): Payer: Self-pay | Admitting: Emergency Medicine

## 2020-06-10 ENCOUNTER — Other Ambulatory Visit: Payer: Self-pay

## 2020-06-10 DIAGNOSIS — F17210 Nicotine dependence, cigarettes, uncomplicated: Secondary | ICD-10-CM | POA: Insufficient documentation

## 2020-06-10 DIAGNOSIS — N3001 Acute cystitis with hematuria: Secondary | ICD-10-CM | POA: Diagnosis not present

## 2020-06-10 DIAGNOSIS — J449 Chronic obstructive pulmonary disease, unspecified: Secondary | ICD-10-CM | POA: Diagnosis not present

## 2020-06-10 DIAGNOSIS — Z79899 Other long term (current) drug therapy: Secondary | ICD-10-CM | POA: Insufficient documentation

## 2020-06-10 DIAGNOSIS — R531 Weakness: Secondary | ICD-10-CM | POA: Diagnosis not present

## 2020-06-10 DIAGNOSIS — F0390 Unspecified dementia without behavioral disturbance: Secondary | ICD-10-CM | POA: Diagnosis not present

## 2020-06-10 DIAGNOSIS — R4182 Altered mental status, unspecified: Secondary | ICD-10-CM | POA: Diagnosis not present

## 2020-06-10 DIAGNOSIS — Z7982 Long term (current) use of aspirin: Secondary | ICD-10-CM | POA: Diagnosis not present

## 2020-06-10 DIAGNOSIS — I10 Essential (primary) hypertension: Secondary | ICD-10-CM | POA: Diagnosis not present

## 2020-06-10 HISTORY — DX: Unspecified dementia, unspecified severity, without behavioral disturbance, psychotic disturbance, mood disturbance, and anxiety: F03.90

## 2020-06-10 LAB — URINALYSIS, ROUTINE W REFLEX MICROSCOPIC
Bilirubin Urine: NEGATIVE
Glucose, UA: NEGATIVE mg/dL
Ketones, ur: NEGATIVE mg/dL
Nitrite: POSITIVE — AB
Protein, ur: 30 mg/dL — AB
Specific Gravity, Urine: 1.008 (ref 1.005–1.030)
pH: 6 (ref 5.0–8.0)

## 2020-06-10 LAB — CBC WITH DIFFERENTIAL/PLATELET
Abs Immature Granulocytes: 0.03 10*3/uL (ref 0.00–0.07)
Basophils Absolute: 0.1 10*3/uL (ref 0.0–0.1)
Basophils Relative: 1 %
Eosinophils Absolute: 0.1 10*3/uL (ref 0.0–0.5)
Eosinophils Relative: 1 %
HCT: 50.3 % — ABNORMAL HIGH (ref 36.0–46.0)
Hemoglobin: 16.4 g/dL — ABNORMAL HIGH (ref 12.0–15.0)
Immature Granulocytes: 0 %
Lymphocytes Relative: 32 %
Lymphs Abs: 3.2 10*3/uL (ref 0.7–4.0)
MCH: 32 pg (ref 26.0–34.0)
MCHC: 32.6 g/dL (ref 30.0–36.0)
MCV: 98.1 fL (ref 80.0–100.0)
Monocytes Absolute: 0.7 10*3/uL (ref 0.1–1.0)
Monocytes Relative: 7 %
Neutro Abs: 5.8 10*3/uL (ref 1.7–7.7)
Neutrophils Relative %: 59 %
Platelets: 239 10*3/uL (ref 150–400)
RBC: 5.13 MIL/uL — ABNORMAL HIGH (ref 3.87–5.11)
RDW: 14.1 % (ref 11.5–15.5)
WBC: 9.8 10*3/uL (ref 4.0–10.5)
nRBC: 0 % (ref 0.0–0.2)

## 2020-06-10 LAB — COMPREHENSIVE METABOLIC PANEL
ALT: 18 U/L (ref 0–44)
AST: 24 U/L (ref 15–41)
Albumin: 4.1 g/dL (ref 3.5–5.0)
Alkaline Phosphatase: 81 U/L (ref 38–126)
Anion gap: 11 (ref 5–15)
BUN: 11 mg/dL (ref 8–23)
CO2: 25 mmol/L (ref 22–32)
Calcium: 10 mg/dL (ref 8.9–10.3)
Chloride: 104 mmol/L (ref 98–111)
Creatinine, Ser: 0.73 mg/dL (ref 0.44–1.00)
GFR, Estimated: >60 mL/min (ref >60)
Glucose, Bld: 98 mg/dL (ref 70–99)
Potassium: 2.9 mmol/L — ABNORMAL LOW (ref 3.5–5.1)
Sodium: 140 mmol/L (ref 135–145)
Total Bilirubin: 0.7 mg/dL (ref 0.3–1.2)
Total Protein: 7.6 g/dL (ref 6.5–8.1)

## 2020-06-10 LAB — CK: Total CK: 69 U/L (ref 38–234)

## 2020-06-10 MED ORDER — POTASSIUM CHLORIDE CRYS ER 20 MEQ PO TBCR
40.0000 meq | EXTENDED_RELEASE_TABLET | Freq: Once | ORAL | Status: AC
  Administered 2020-06-10: 40 meq via ORAL
  Filled 2020-06-10: qty 2

## 2020-06-10 MED ORDER — SODIUM CHLORIDE 0.9 % IV BOLUS (SEPSIS)
500.0000 mL | Freq: Once | INTRAVENOUS | Status: AC
  Administered 2020-06-10: 500 mL via INTRAVENOUS

## 2020-06-10 MED ORDER — CEPHALEXIN 500 MG PO CAPS
500.0000 mg | ORAL_CAPSULE | Freq: Once | ORAL | Status: AC
  Administered 2020-06-10: 500 mg via ORAL
  Filled 2020-06-10: qty 1

## 2020-06-10 MED ORDER — CIPROFLOXACIN HCL 500 MG PO TABS: 500.0000 mg | ORAL_TABLET | Freq: Two times a day (BID) | ORAL | 0 refills | Status: DC

## 2020-06-10 NOTE — Discharge Instructions (Signed)
Drink plenty of fluids and follow-up with your family doctor next week for recheck °

## 2020-06-10 NOTE — ED Provider Notes (Signed)
[facility] Provider Note   CSN: [id number] Arrival date & time: 06/10/20  1601     History Chief Complaint  Patient presents with  . Altered Mental Status    Carla [name] is a 75 y.o. female.  Patient has a history of dementia.  Patient was found outside by herself.  She had been outside about an hour and a half.  Patient states that she has no complaints.  She is oriented to person place only  The history is provided by the patient and medical records. No language interpreter was used.  Altered Mental Status Presenting symptoms: behavior changes   Severity:  Mild Most recent episode:  More than 2 days ago Episode history:  Continuous Timing:  Constant Progression:  Waxing and waning Chronicity:  Recurrent Context: dementia   Associated symptoms: no abdominal pain, no hallucinations, no headaches, no rash and no seizures        Past Medical History:  Diagnosis Date  . Anxiety   . ASCVD (arteriosclerotic cardiovascular disease)    No critical disease on cath in 8/97: mild AI with trival, if any l AS; negative stress nuclear in 2010  . Atrophic vaginitis 04/18/2016  . Back pain   . Cervical disc herniation   . Chronic bronchitis   . Colon polyps   . De Quervain's disease (radial styloid tenosynovitis) 10/14/2016  . Dementia ([place]) 06/10/2020  . Depression   . Elevated hemoglobin A1c   . Fasting hyperglycemia   . GERD (gastroesophageal reflux disease)   . Heart disease   . HSV (herpes simplex virus) infection   . Hydronephrosis   . Hypercalcemia 12/30/2008   Qualifier: Diagnosis of  By: Cori Razor LPN, Brandi    . Hypercholesteremia   . Hypertension   . Neck muscle spasm 04/23/2014  . Onychomycosis 07/08/2015  . Osteoporosis   . Peripheral vascular disease ([place])    stauts post aortabifemoral graft    . Secondary erythrocytosis 08/05/2012   Secondary to COPD  . Tobacco abuse    has stopped  . 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 Pain in both lower extremities 08/11/2019  . Elevated hemoglobin ([place]) 08/11/2019  . Memory loss 08/11/2019  . Anorexia 08/11/2019  . Serrated adenoma of colon 05/19/2019  . Weight loss, abnormal 04/26/2019  . Malnutrition of moderate degree ([place]) 01/25/2019  . Nicotine addiction 01/25/2019  . Prediabetes 01/19/2019  . Shortness of breath 09/21/2018  . Anxiety attack 09/21/2018  . Generalized weakness 09/21/2018  . MCI (mild cognitive impairment) with memory loss 01/27/2017  . GAD (generalized anxiety disorder) 02/13/2014  . Bilateral lower abdominal pain 07/07/2013  . Seasonal allergies 03/30/2013  . Cervical neck pain with evidence of disc disease 11/03/2012  . Hearing loss 04/13/2012  . Carotid bruit 04/13/2012  . Back pain with radiation 10/15/2011  . Emphysema with chronic bronchitis ([place]) 01/17/2011  . Arteriosclerotic cardiovascular disease (ASCVD) 09/17/2009  . PERIPHERAL VASCULAR DISEASE 09/17/2009  . GASTROESOPHAGEAL REFLUX DISEASE 09/17/2009  . Primary hyperparathyroidism ([place]) 01/20/2009  . Vitamin D deficiency 01/20/2009  . Hypercalcemia 12/30/2008  . Hyperlipidemia LDL goal <100 06/12/2007  . Depression 06/12/2007  . Essential hypertension 06/12/2007  . Osteoporosis 06/12/2007    Past Surgical History:  Procedure Laterality Date  . ABDOMINAL HYSTERECTOMY    . aorta bifem graft    . CHOLECYSTECTOMY    . COLONOSCOPY  06/23/2006   Dr. Fields:Normal colon  without evidence of polyps, masses, inflammatory changes/Normal retroflex view of the rectum  . COLONOSCOPY N/A 08/24/2013   Procedure: COLONOSCOPY;  Surgeon: Danie Binder, MD;  Location: [facility];  Service: Endoscopy;  Laterality: N/A;  10:30-moved to [place] notified pt  . lysis of adhesions  2000  . PARTIAL HYSTERECTOMY    . right kidney surgery following damage during arterial surgery    . TOTAL ABDOMINAL HYSTERECTOMY W/ BILATERAL  SALPINGOOPHORECTOMY  1975     OB History   No obstetric history on file.     Family History  Problem Relation Age of Onset  . Heart attack Father   . COPD Sister   . Heart disease Sister   . Diabetes Sister   . Coronary artery disease Sister   . Liver cancer Sister 58  . Diabetes Sister   . Diabetes Sister   . COPD Sister   . Heart attack Mother   . COPD Brother   . Heart attack Son   . Colon cancer Neg Hx     Social History   Tobacco Use  . Smoking status: Current Every Day Smoker    Packs/day: 0.50    Years: 50.00    Pack years: 25.00    Types: Cigarettes    Last attempt to quit: 12/12/2010    Years since quitting: 9.5  . Smokeless tobacco: Never Used  Vaping Use  . Vaping Use: Never used  Substance Use Topics  . Alcohol use: No  . Drug use: No    Home Medications Prior to Admission medications   Medication Sig Start Date End Date Taking? Authorizing Provider  amLODipine (NORVASC) 10 MG tablet TAKE ONE TABLET BY MOUTH ONCE DAILY. 01/10/20  Yes Fayrene Helper, MD  aspirin EC 81 MG tablet Take 81 mg by mouth at bedtime.    Yes [provider]  ciprofloxacin (CIPRO) 500 MG tablet Take 1 tablet (500 mg total) by mouth 2 (two) times daily. One po bid x 7 days 06/10/20  Yes Milton Ferguson, MD  memantine (NAMENDA) 10 MG tablet TAKE ONE TABLET BY MOUTH ONCE DAILY. 04/04/20  Yes Perlie Mayo, NP  Multiple Vitamin (MULTIVITAMIN WITH MINERALS) TABS Take 1 tablet by mouth every evening.   Yes [provider]  potassium chloride SA (KLOR-CON) 20 MEQ tablet Take 1 tablet (20 mEq total) by mouth 2 (two) times daily. 02/07/19  Yes Nat Christen, MD  rosuvastatin (CRESTOR) 40 MG tablet TAKE 1 TABLET BY MOUTH ONCE A DAY. 12/21/19  Yes Fayrene Helper, MD  alendronate (FOSAMAX) 70 MG tablet TAKE 1 TABLET EACH WEEK 30 MIN BEFORE BREAKFAST WITH 8 OUNCES OF WATER FOR OSTEOPOROSIS. Patient not taking: No sig reported 06/05/15   Fayrene Helper, MD   gabapentin (NEURONTIN) 100 MG capsule TAKE (2) CAPSULES BY MOUTH AT BEDTIME. Patient not taking: No sig reported 05/01/20   Fayrene Helper, MD  montelukast (SINGULAIR) 10 MG tablet TAKE (1) TABLET BY MOUTH AT BEDTIME. Patient not taking: No sig reported 10/19/18   Fayrene Helper, MD    Allergies    Aricept Reather Littler hcl], Calcium-containing compounds, and Exelon [rivastigmine tartrate]  Review of Systems   Review of Systems  Constitutional: Negative for appetite change and fatigue.  HENT: Negative for congestion, ear discharge and sinus pressure.   Eyes: Negative for discharge.  Respiratory: Negative for cough.   Cardiovascular: Negative for chest pain.  Gastrointestinal: Negative for abdominal pain and diarrhea.  Genitourinary: Negative  for frequency and hematuria.  Musculoskeletal: Negative for back pain.  Skin: Negative for rash.  Neurological: Negative for seizures and headaches.  Psychiatric/Behavioral: Negative for hallucinations.    Physical Exam Updated Vital Signs BP (!) 183/86   Pulse 88   Temp 98.5 F (36.9 C) (Oral)   Resp (!) 22   Ht 5\' 2"  (1.575 m)   Wt 54.4 kg   SpO2 98%   BMI 21.95 kg/m   Physical Exam Vitals and nursing note reviewed.  Constitutional:      Appearance: She is well-developed.  HENT:     Head: Normocephalic.     Nose: Nose normal.  Eyes:     General: No scleral icterus.    Conjunctiva/sclera: Conjunctivae normal.  Neck:     Thyroid: No thyromegaly.  Cardiovascular:     Rate and Rhythm: Normal rate and regular rhythm.     Heart sounds: No murmur heard. No friction rub. No gallop.   Pulmonary:     Breath sounds: No stridor. No wheezing or rales.  Chest:     Chest wall: No tenderness.  Abdominal:     General: There is no distension.     Tenderness: There is no abdominal tenderness. There is no rebound.  Musculoskeletal:        General: Normal range of motion.     Cervical back: Neck supple.  Lymphadenopathy:      Cervical: No cervical adenopathy.  Skin:    Findings: No erythema or rash.  Neurological:     Mental Status: She is alert.     Motor: No abnormal muscle tone.     Coordination: Coordination normal.     Comments: Oriented person place only  Psychiatric:        Behavior: Behavior normal.     ED Results / Procedures / Treatments   Labs (all labs ordered are listed, but only abnormal results are displayed) Labs Reviewed  CBC WITH DIFFERENTIAL/PLATELET - Abnormal; Notable for the following components:      Result Value   RBC 5.13 (*)    Hemoglobin 16.4 (*)    HCT 50.3 (*)    All other components within normal limits  COMPREHENSIVE METABOLIC PANEL - Abnormal; Notable for the following components:   Potassium 2.9 (*)    All other components within normal limits  URINALYSIS, ROUTINE W REFLEX MICROSCOPIC - Abnormal; Notable for the following components:   APPearance HAZY (*)    Hgb urine dipstick MODERATE (*)    Protein, ur 30 (*)    Nitrite POSITIVE (*)    Leukocytes,Ua SMALL (*)    Bacteria, UA MANY (*)    All other components within normal limits  URINE CULTURE  CK    EKG None  Radiology No results found.  Procedures Procedures   Medications Ordered in ED Medications  sodium chloride 0.9 % bolus 500 mL (0 mLs Intravenous Stopped 06/10/20 1735)  potassium chloride SA (KLOR-CON) CR tablet 40 mEq (40 mEq Oral Given 06/10/20 1735)  cephALEXin (KEFLEX) capsule 500 mg (500 mg Oral Given 06/10/20 1738)    ED Course  I have reviewed the triage vital signs and the nursing notes.  Pertinent labs & imaging results that were available during my care of the patient were reviewed by me and considered in my medical decision making (see chart for details).    MDM Rules/Calculators/A&P  Patient with mild hypokalemia and urinary tract infection and dementia.  She is started on Cipro and is given some potassium and her son will come pick her up Final  Clinical Impression(s) / ED Diagnoses Final diagnoses:  Acute cystitis with hematuria    Rx / DC Orders ED Discharge Orders         Ordered    ciprofloxacin (CIPRO) 500 MG tablet  2 times daily        06/10/20 1742           Milton Ferguson, MD 06/10/20 1744

## 2020-06-10 NOTE — ED Triage Notes (Signed)
Pt was reported MIA by family x1hr. EMS found pt lying under tree conscious and alert. Pt  Noted with hx of dementia per husband. A + d/o x3

## 2020-06-13 LAB — URINE CULTURE: Culture: >=100000 — AB

## 2020-06-14 NOTE — Progress Notes (Signed)
ED Antimicrobial Stewardship Positive Culture Follow Up   Carla [name] is an 75 y.o. female who presented to [facility] on 06/10/2020 with a chief complaint of  Chief Complaint  Patient presents with  . Altered Mental Status    Recent Results (from the past 720 hour(s))  Urine Culture     Status: Abnormal   Collection Time: 06/10/20  4:29 PM   Specimen: Urine, Clean Catch  Result Value Ref Range Status   Specimen Description   Final    URINE, CLEAN CATCH Performed at [facility], [address] [place], [place] [address]    Special Requests   Final    NONE Performed at [facility], [address] [place], [place] [address]    Culture >=100,000 COLONIES/mL ESCHERICHIA COLI (A)  Final   Report Status 06/13/2020 FINAL  Final   Organism ID, Bacteria ESCHERICHIA COLI (A)  Final      Susceptibility   Escherichia coli - MIC*    AMPICILLIN <=2 SENSITIVE Sensitive     CEFAZOLIN <=4 SENSITIVE Sensitive     CEFEPIME <=0.12 SENSITIVE Sensitive     CEFTRIAXONE <=0.25 SENSITIVE Sensitive     CIPROFLOXACIN >=4 RESISTANT Resistant     GENTAMICIN <=1 SENSITIVE Sensitive     IMIPENEM <=0.25 SENSITIVE Sensitive     NITROFURANTOIN <=16 SENSITIVE Sensitive     TRIMETH/SULFA <=20 SENSITIVE Sensitive     AMPICILLIN/SULBACTAM <=2 SENSITIVE Sensitive     PIP/TAZO <=4 SENSITIVE Sensitive     * >=100,000 COLONIES/mL ESCHERICHIA COLI    [x]  Treated with Ciprofloxacin, organism resistant to prescribed antimicrobial []  Patient discharged originally without antimicrobial agent and treatment is now indicated  Plan: Discontinue ciprofloxacin New antibiotic prescription: Amoxicillin 875 mg twice daily for 7 days  ED Provider: Debbe Mounts, PA-C   Jacobo Forest PharmD Candidate 2022 06/14/2020 11:00 AM

## 2020-06-27 ENCOUNTER — Telehealth: Payer: Self-pay

## 2020-06-27 NOTE — Telephone Encounter (Signed)
Christia Reading from [employer] called left voice mail to return her call. Following up on a case. Please return her call [phone] ext [phone].

## 2020-07-03 ENCOUNTER — Telehealth: Payer: Self-pay

## 2020-07-03 ENCOUNTER — Other Ambulatory Visit: Payer: Self-pay | Admitting: Family Medicine

## 2020-07-03 NOTE — Telephone Encounter (Signed)
Spoke wth Morey Hummingbird- see previous note sent to dr simpson

## 2020-07-03 NOTE — Telephone Encounter (Signed)
Charlies Constable from adult protective services wants to speak with you after your visit with Carla [name] tomorrow. Wants you to assess her mental status and review MMSE to see if further declining and if you think she is capable of making her own health decisions. [phone] [phone]

## 2020-07-04 ENCOUNTER — Encounter: Payer: Medicare HMO | Admitting: Family Medicine

## 2020-07-04 NOTE — Telephone Encounter (Signed)
Message left for Carla [name] that pt did not keep appt

## 2020-07-04 NOTE — Telephone Encounter (Signed)
Pls call and let her know pt did not keep her appt

## 2020-07-26 ENCOUNTER — Encounter: Payer: Self-pay | Admitting: Family Medicine

## 2020-07-26 ENCOUNTER — Ambulatory Visit (INDEPENDENT_AMBULATORY_CARE_PROVIDER_SITE_OTHER): Payer: Medicare HMO | Admitting: Family Medicine

## 2020-07-26 ENCOUNTER — Other Ambulatory Visit: Payer: Self-pay

## 2020-07-26 VITALS — BP 193/85 | HR 82 | Resp 16 | Ht 62.0 in | Wt 89.0 lb

## 2020-07-26 DIAGNOSIS — Z1231 Encounter for screening mammogram for malignant neoplasm of breast: Secondary | ICD-10-CM

## 2020-07-26 DIAGNOSIS — Z87891 Personal history of nicotine dependence: Secondary | ICD-10-CM | POA: Diagnosis not present

## 2020-07-26 DIAGNOSIS — E440 Moderate protein-calorie malnutrition: Secondary | ICD-10-CM | POA: Diagnosis not present

## 2020-07-26 DIAGNOSIS — I10 Essential (primary) hypertension: Secondary | ICD-10-CM | POA: Diagnosis not present

## 2020-07-26 DIAGNOSIS — F0280 Dementia in other diseases classified elsewhere without behavioral disturbance: Secondary | ICD-10-CM | POA: Diagnosis not present

## 2020-07-26 DIAGNOSIS — F17218 Nicotine dependence, cigarettes, with other nicotine-induced disorders: Secondary | ICD-10-CM | POA: Diagnosis not present

## 2020-07-26 DIAGNOSIS — G301 Alzheimer's disease with late onset: Secondary | ICD-10-CM

## 2020-07-26 MED ORDER — AMLODIPINE BESYLATE 2.5 MG PO TABS: 2.5000 mg | ORAL_TABLET | Freq: Every day | ORAL | 3 refills | Status: DC

## 2020-07-26 NOTE — Patient Instructions (Addendum)
F/U in 4 weeks, re evaluate blood pressure and weight    Amlodipine 2.5 mg daily, start today for your blood pressure  Need chest scan and mammogram appt will be made  And Social Service will get you there, we will call with appt info  Memory is poor you are referred to dr Merlene Laughter for evaluation and management of this    Drink Boost or Ensure plus one daily  Thanks for choosing [facility], we consider it a privelige to serve you.

## 2020-07-28 ENCOUNTER — Encounter: Payer: Self-pay | Admitting: Family Medicine

## 2020-07-28 ENCOUNTER — Telehealth: Payer: Self-pay | Admitting: Family Medicine

## 2020-07-28 DIAGNOSIS — D539 Nutritional anemia, unspecified: Secondary | ICD-10-CM

## 2020-07-28 DIAGNOSIS — E440 Moderate protein-calorie malnutrition: Secondary | ICD-10-CM

## 2020-07-28 DIAGNOSIS — F0390 Unspecified dementia without behavioral disturbance: Secondary | ICD-10-CM | POA: Insufficient documentation

## 2020-07-28 DIAGNOSIS — E210 Primary hyperparathyroidism: Secondary | ICD-10-CM

## 2020-07-28 DIAGNOSIS — R630 Anorexia: Secondary | ICD-10-CM

## 2020-07-28 DIAGNOSIS — I10 Essential (primary) hypertension: Secondary | ICD-10-CM

## 2020-07-28 DIAGNOSIS — R413 Other amnesia: Secondary | ICD-10-CM

## 2020-07-28 DIAGNOSIS — E785 Hyperlipidemia, unspecified: Secondary | ICD-10-CM

## 2020-07-28 DIAGNOSIS — E559 Vitamin D deficiency, unspecified: Secondary | ICD-10-CM

## 2020-07-28 NOTE — Assessment & Plan Note (Signed)
Uncontrolled , start amlodipine 2.5 mg and re evaluate

## 2020-07-28 NOTE — Telephone Encounter (Signed)
Please refer her to nutrition  As she is malnourished, also needs rept labs cBC, lipid, cmp and EGFr, tSH, Vit D , B12 and RPR, contact her case worker about this please

## 2020-07-28 NOTE — Assessment & Plan Note (Signed)
Marked deterioration in overall welleing with significant weight loss , refer to Neurology for evaluation and management. Protective servie involvement needed, as spouse has been inconsistent as far as attention to her health and wellbeing is concerned Get updated  Labs, most recent were excellent

## 2020-07-28 NOTE — Assessment & Plan Note (Signed)
  Patient re-educated about  the importance of commitment to a  minimum of 150 minutes of exercise per week as able.  The importance of healthy food choices with portion control discussed, as well as eating regularly and within a 12 hour window most days. The need to choose "clean , green" food 50 to 75% of the time is discussed, as well as to make water the primary drink and set a goal of 64 ounces water daily.    Weight /BMI 07/26/2020 06/10/2020 08/25/2019  WEIGHT 89 lb 120 lb 98 lb  HEIGHT 5\' 2"  5\' 2"  5\' 4"   BMI 16.28 kg/m2 21.95 kg/m2 16.82 kg/m2    Need to refer to dietician and start Boost twice daily, weight loss secondary to dementia and inadequate help at home Need updated labs

## 2020-07-28 NOTE — Progress Notes (Signed)
   Carla [name]     MRN: [id number]      DOB: [birth year]   HPI Carla [name] is here with adult protective services and under police escort because of concerns re her inability to care for herself with deterioration in overall health, and ongoing weight loss. Social services has  met with her family, 2 in  [place] and also a niece, states that they  also are concerned and agree with help from aPS. On 2 occasions attempts have been made to have her spouse bring her in and he has not complied. She has dementia based on MMSE and needs to be cared for, her current living environment is not appropriate and placement in a safe environment will be ib her best interest  ROS   PE  BP (!) 193/85   Pulse 82   Resp 16   Ht _0  (1.575 m)   Wt 89 lb (40.4 kg)   SpO2 91%   BMI 16.28 kg/m   Patient alert , unkempt and appears to have not had a bath in days, states she is embarrassed to be here in this condition. disoriented to time   HEENT: No facial asymmetry, EOMI,     Neck supple .  Chest: Clear to auscultation bilaterally.decreased air entry throughout  CVS: S1, S2 no murmurs, no S3.Regular rate.  ABD: Soft non tender.   Ext: No edema  MS: Adequate ROM spine, shoulders, hips and knees.  CNS: CN 2-12 intact, power,  normal throughout.no focal deficits noted.   Assessment & Plan  Malnutrition of moderate degree (HCC)  Patient re-educated about  the importance of commitment to a  minimum of 150 minutes of exercise per week as able.  The importance of healthy food choices with portion control discussed, as well as eating regularly and within a 12 hour window most days. The need to choose "clean , green" food 50 to 75% of the time is discussed, as well as to make water the primary drink and set a goal of 64 ounces water daily.    Weight /BMI 07/26/2020 06/10/2020 08/25/2019  WEIGHT 89 lb 120 lb 98 lb  HEIGHT _1  _2  _3   BMI 16.28 kg/m2 21.95 kg/m2 16.82 kg/m2    Need to  refer to dietician and start Boost twice daily, weight loss secondary to dementia and inadequate help at home Need updated labs  Dementia ([place]) Marked deterioration in overall welleing with significant weight loss , refer to Neurology for evaluation and management. Protective servie involvement needed, as spouse has been inconsistent as far as attention to her health and wellbeing is concerned Get updated  Labs, most recent were excellent  Essential hypertension Uncontrolled , start amlodipine 2.5 mg and re evaluate  Nicotine addiction Asked:confirms currently smokes cigarettes Assess: Unwilling to set a quit date,  Advise: needs to QUIT to reduce risk of cancer, cardio and cerebrovascular disease Assist: counseled for 5 minutes and literature provided Arrange: follow up in 2 to 4 months Updated chest scan needed, may also qualify for home oxygen

## 2020-07-28 NOTE — Assessment & Plan Note (Signed)
Asked:confirms currently smokes cigarettes Assess: Unwilling to set a quit date,  Advise: needs to QUIT to reduce risk of cancer, cardio and cerebrovascular disease Assist: counseled for 5 minutes and literature provided Arrange: follow up in 2 to 4 months Updated chest scan needed, may also qualify for home oxygen

## 2020-07-31 NOTE — Telephone Encounter (Signed)
Carla [name] aware and said she is trying to get her placed but if was unable then would arrange bringing her for labs

## 2020-07-31 NOTE — Telephone Encounter (Signed)
Labs ordered and referral entered- need to call Morey Hummingbird and make her aware

## 2020-08-03 ENCOUNTER — Other Ambulatory Visit: Payer: Self-pay

## 2020-08-03 ENCOUNTER — Ambulatory Visit (HOSPITAL_COMMUNITY)
Admission: RE | Admit: 2020-08-03 | Discharge: 2020-08-03 | Disposition: A | Discharge status: Home / Self Care | Payer: Medicare HMO | Source: Ambulatory Visit | Attending: Family Medicine | Admitting: Family Medicine

## 2020-08-03 ENCOUNTER — Other Ambulatory Visit (HOSPITAL_COMMUNITY): Payer: Self-pay | Admitting: Family Medicine

## 2020-08-03 ENCOUNTER — Ambulatory Visit (HOSPITAL_COMMUNITY): Payer: Medicare HMO

## 2020-08-03 DIAGNOSIS — Z1231 Encounter for screening mammogram for malignant neoplasm of breast: Secondary | ICD-10-CM | POA: Insufficient documentation

## 2020-08-10 DIAGNOSIS — E785 Hyperlipidemia, unspecified: Secondary | ICD-10-CM | POA: Diagnosis not present

## 2020-08-10 DIAGNOSIS — E559 Vitamin D deficiency, unspecified: Secondary | ICD-10-CM | POA: Diagnosis not present

## 2020-08-10 DIAGNOSIS — I10 Essential (primary) hypertension: Secondary | ICD-10-CM | POA: Diagnosis not present

## 2020-08-10 DIAGNOSIS — D539 Nutritional anemia, unspecified: Secondary | ICD-10-CM | POA: Diagnosis not present

## 2020-08-10 DIAGNOSIS — Z113 Encounter for screening for infections with a predominantly sexual mode of transmission: Secondary | ICD-10-CM | POA: Diagnosis not present

## 2020-08-10 DIAGNOSIS — R413 Other amnesia: Secondary | ICD-10-CM | POA: Diagnosis not present

## 2020-08-11 LAB — CBC
Hematocrit: 51.4 % — ABNORMAL HIGH (ref 34.0–46.6)
Hemoglobin: 17.6 g/dL — ABNORMAL HIGH (ref 11.1–15.9)
MCH: 31.6 pg (ref 26.6–33.0)
MCHC: 34.2 g/dL (ref 31.5–35.7)
MCV: 92 fL (ref 79–97)
Platelets: 289 10*3/uL (ref 150–450)
RBC: 5.57 x10E6/uL — ABNORMAL HIGH (ref 3.77–5.28)
RDW: 12.9 % (ref 11.7–15.4)
WBC: 7.3 10*3/uL (ref 3.4–10.8)

## 2020-08-11 LAB — LIPID PANEL
Chol/HDL Ratio: 3.7 ratio (ref 0.0–4.4)
Cholesterol, Total: 250 mg/dL — ABNORMAL HIGH (ref 100–199)
HDL: 68 mg/dL (ref >39)
LDL Chol Calc (NIH): 159 mg/dL — ABNORMAL HIGH (ref 0–99)
Triglycerides: 128 mg/dL (ref 0–149)
VLDL Cholesterol Cal: 23 mg/dL (ref 5–40)

## 2020-08-11 LAB — VITAMIN D 25 HYDROXY (VIT D DEFICIENCY, FRACTURES): Vit D, 25-Hydroxy: 27.6 ng/mL — ABNORMAL LOW (ref 30.0–100.0)

## 2020-08-11 LAB — CMP14+EGFR
ALT: 10 IU/L (ref 0–32)
AST: 16 IU/L (ref 0–40)
Albumin/Globulin Ratio: 1.7 (ref 1.2–2.2)
Albumin: 4.5 g/dL (ref 3.7–4.7)
Alkaline Phosphatase: 114 IU/L (ref 44–121)
BUN/Creatinine Ratio: 19 (ref 12–28)
BUN: 17 mg/dL (ref 8–27)
Bilirubin Total: 0.4 mg/dL (ref 0.0–1.2)
CO2: 22 mmol/L (ref 20–29)
Calcium: 10.6 mg/dL — ABNORMAL HIGH (ref 8.7–10.3)
Chloride: 100 mmol/L (ref 96–106)
Creatinine, Ser: 0.89 mg/dL (ref 0.57–1.00)
Globulin, Total: 2.6 g/dL (ref 1.5–4.5)
Glucose: 96 mg/dL (ref 65–99)
Potassium: 4.5 mmol/L (ref 3.5–5.2)
Sodium: 138 mmol/L (ref 134–144)
Total Protein: 7.1 g/dL (ref 6.0–8.5)
eGFR: 68 mL/min/{1.73_m2} (ref >59)

## 2020-08-11 LAB — RPR: RPR Ser Ql: NONREACTIVE

## 2020-08-11 LAB — VITAMIN B12: Vitamin B-12: 427 pg/mL (ref 232–1245)

## 2020-08-11 LAB — TSH: TSH: 1.46 u[IU]/mL (ref 0.450–4.500)

## 2020-08-23 ENCOUNTER — Other Ambulatory Visit: Payer: Self-pay

## 2020-08-23 ENCOUNTER — Ambulatory Visit: Payer: Medicare HMO | Admitting: Family Medicine

## 2020-08-23 ENCOUNTER — Encounter: Payer: Self-pay | Admitting: Family Medicine

## 2020-08-23 VITALS — BP 177/96 | HR 91 | Resp 16 | Ht 62.0 in | Wt 91.0 lb

## 2020-08-23 DIAGNOSIS — I10 Essential (primary) hypertension: Secondary | ICD-10-CM | POA: Diagnosis not present

## 2020-08-23 DIAGNOSIS — E785 Hyperlipidemia, unspecified: Secondary | ICD-10-CM

## 2020-08-23 DIAGNOSIS — M810 Age-related osteoporosis without current pathological fracture: Secondary | ICD-10-CM | POA: Diagnosis not present

## 2020-08-23 DIAGNOSIS — F17218 Nicotine dependence, cigarettes, with other nicotine-induced disorders: Secondary | ICD-10-CM | POA: Diagnosis not present

## 2020-08-23 DIAGNOSIS — E440 Moderate protein-calorie malnutrition: Secondary | ICD-10-CM | POA: Diagnosis not present

## 2020-08-23 DIAGNOSIS — G301 Alzheimer's disease with late onset: Secondary | ICD-10-CM | POA: Diagnosis not present

## 2020-08-23 DIAGNOSIS — F0280 Dementia in other diseases classified elsewhere without behavioral disturbance: Secondary | ICD-10-CM | POA: Diagnosis not present

## 2020-08-23 NOTE — Patient Instructions (Addendum)
F/U in 6 weeks, 6 pound weight gain expected, call if you need me sooner For blood pressure amlodipine every day  For cholesterol crestor every day  For bones fosamax once per week  For memory Namenda every day   You can get covid testing at [facility]  Please give pt appt info for Dr Merlene Laughter, call grand daughter with info  Please work on stopping smoking , this is vital  Thanks for choosing [facility], we consider it a privelige to serve you.

## 2020-08-29 ENCOUNTER — Encounter: Payer: Self-pay | Admitting: Family Medicine

## 2020-08-29 NOTE — Progress Notes (Signed)
   Carla [name]     MRN: [id number]      DOB: 1945-08-11   HPI Carla [name] is here for follow up and re-evaluation of chronic medical conditions, medication management and review of any available recent lab and radiology data.  Preventive health is updated, specifically  Cancer screening and Immunization.   .will not drink ensure Still smoking and no plan to quit, trying to cut back however   ROS Denies recent fever or chills. Denies sinus pressure, nasal congestion, ear pain or sore throat. Denies chest congestion, productive cough or wheezing. Denies chest pains, palpitations and leg swelling Denies abdominal pain, nausea, vomiting,diarrhea or constipation.   Denies dysuria, frequency, hesitancy or incontinence. Chronic  joint pain, swelling and limitation in mobility. Denies headaches, seizures, numbness, or tingling. Denies depression, anxiety or insomnia. Denies skin break down or rash.   PE  BP (!) 177/96   Pulse 91   Resp 16   Ht 5\' 2"  (1.575 m)   Wt 91 lb (41.3 kg)   SpO2 92%   BMI 16.64 kg/m   Patient alert  and in mild cardiopulmonary distress.  HEENT: No facial asymmetry, EOMI,     Neck supple .  Chest: Clear to auscultation bilaterally.Decreased air entry throughout  CVS: S1, S2 no murmurs, no S3.Regular rate.  ABD: Soft non tender.   Ext: No edema  MS: decreased  ROM spine, shoulders, hips and knees.  Skin: Intact, no ulcerations or rash noted.  Psych: Good eye contact, flat affect. Memory impaired  not anxious or depressed appearing.  CNS: CN 2-12 intact, power,  normal throughout.no focal deficits noted.   Assessment & Plan  Dementia [facility]) Needs to commit to namenda, is also referred to Neurology  Essential hypertension Uncontrolled. [place] daughter to ensure compliance. Review in 6 weeks DASH diet and commitment to daily physical activity for a minimum of 30 minutes discussed and encouraged, as a part of hypertension management. The  importance of attaining a healthy weight is also discussed.  BP/Weight 08/23/2020 07/26/2020 06/10/2020 08/25/2019 08/11/2019 07/12/2019 0/96/2836  Systolic BP 629 476 546 503 546 568 127  Diastolic BP 96 85 86 63 69 78 64  Wt. (Lbs) 91 89 120 98 99.2 100 102  BMI 16.64 16.28 21.95 16.82 18.74 18.29 18.66       Hyperlipidemia LDL goal <100 Hyperlipidemia:Low fat diet discussed and encouraged.   Lipid Panel  Lab Results  Component Value Date   CHOL 250 (H) 08/10/2020   HDL 68 08/10/2020   LDLCALC 159 (H) 08/10/2020   LDLDIRECT 166 (H) 10/11/2009   TRIG 128 08/10/2020   CHOLHDL 3.7 08/10/2020   Commitment to medication and lower fat intake discussed    Malnutrition of moderate degree (HCC) Importance of frequent small meals that she enjoys is discussed still very underweight.   Nicotine addiction Asked:confirms currently smokes cigarettes Assess: Unwilling to set a quit date, but is cutting back Advise: needs to QUIT to reduce risk of cancer, cardio and cerebrovascular disease Assist: counseled for 5 minutes and literature provided Arrange: follow up in 2 to 4 months   Osteoporosis Continue weekly fosamax

## 2020-08-29 NOTE — Assessment & Plan Note (Signed)
Importance of frequent small meals that she enjoys is discussed still very underweight.

## 2020-08-29 NOTE — Assessment & Plan Note (Signed)
Hyperlipidemia:Low fat diet discussed and encouraged.   Lipid Panel  Lab Results  Component Value Date   CHOL 250 (H) 08/10/2020   HDL 68 08/10/2020   LDLCALC 159 (H) 08/10/2020   LDLDIRECT 166 (H) 10/11/2009   TRIG 128 08/10/2020   CHOLHDL 3.7 08/10/2020   Commitment to medication and lower fat intake discussed

## 2020-08-29 NOTE — Assessment & Plan Note (Signed)
Continue weekly fosamax 

## 2020-08-29 NOTE — Assessment & Plan Note (Signed)
Needs to commit to namenda, is also referred to Neurology

## 2020-08-29 NOTE — Assessment & Plan Note (Signed)
Uncontrolled. [place] daughter to ensure compliance. Review in 6 weeks DASH diet and commitment to daily physical activity for a minimum of 30 minutes discussed and encouraged, as a part of hypertension management. The importance of attaining a healthy weight is also discussed.  BP/Weight 08/23/2020 07/26/2020 06/10/2020 08/25/2019 08/11/2019 07/12/2019 04/03/2109  Systolic BP 735 670 141 030 131 438 887  Diastolic BP 96 85 86 63 69 78 64  Wt. (Lbs) 91 89 120 98 99.2 100 102  BMI 16.64 16.28 21.95 16.82 18.74 18.29 18.66

## 2020-08-29 NOTE — Assessment & Plan Note (Signed)
Asked:confirms currently smokes cigarettes °Assess: Unwilling to set a quit date, but is cutting back °Advise: needs to QUIT to reduce risk of cancer, cardio and cerebrovascular disease °Assist: counseled for 5 minutes and literature provided °Arrange: follow up in 2 to 4 months ° °

## 2020-09-21 ENCOUNTER — Other Ambulatory Visit: Payer: Self-pay

## 2020-09-21 DIAGNOSIS — R531 Weakness: Secondary | ICD-10-CM

## 2020-09-21 DIAGNOSIS — M79605 Pain in left leg: Secondary | ICD-10-CM

## 2020-10-04 ENCOUNTER — Ambulatory Visit: Payer: Medicare HMO | Admitting: Family Medicine

## 2020-10-05 ENCOUNTER — Telehealth: Payer: Self-pay | Admitting: *Deleted

## 2020-10-05 NOTE — Telephone Encounter (Signed)
Carla [name] pt granddaughter called said pt is needing something for sleep she has not slept in 4 days. Pt has appt next week.

## 2020-10-06 NOTE — Telephone Encounter (Signed)
Pt niece informed.

## 2020-10-12 ENCOUNTER — Other Ambulatory Visit: Payer: Self-pay | Admitting: Family Medicine

## 2020-10-19 ENCOUNTER — Ambulatory Visit: Payer: Medicare HMO | Admitting: Family Medicine

## 2020-11-13 ENCOUNTER — Other Ambulatory Visit: Payer: Self-pay | Admitting: Family Medicine

## 2020-11-17 ENCOUNTER — Ambulatory Visit: Payer: Medicare HMO | Admitting: Family Medicine

## 2020-11-21 ENCOUNTER — Ambulatory Visit: Payer: Medicare HMO | Admitting: Neurology

## 2020-11-23 ENCOUNTER — Ambulatory Visit: Payer: Medicare HMO | Admitting: Neurology

## 2020-12-01 ENCOUNTER — Ambulatory Visit: Payer: Medicare HMO | Admitting: Family Medicine

## 2020-12-14 ENCOUNTER — Other Ambulatory Visit: Payer: Self-pay | Admitting: Family Medicine

## 2020-12-27 ENCOUNTER — Ambulatory Visit: Payer: Medicare HMO | Admitting: Neurology

## 2020-12-29 ENCOUNTER — Ambulatory Visit: Payer: Medicare HMO | Admitting: Family Medicine

## 2021-01-17 ENCOUNTER — Ambulatory Visit: Payer: Medicare HMO | Admitting: Neurology

## 2021-01-26 ENCOUNTER — Other Ambulatory Visit: Payer: Self-pay | Admitting: Family Medicine

## 2021-02-07 ENCOUNTER — Other Ambulatory Visit: Payer: Self-pay | Admitting: Family Medicine

## 2021-02-27 ENCOUNTER — Encounter: Payer: PPO | Admitting: Family Medicine

## 2021-02-27 ENCOUNTER — Other Ambulatory Visit: Payer: Self-pay

## 2021-02-27 NOTE — Patient Instructions (Signed)

## 2021-03-14 ENCOUNTER — Other Ambulatory Visit: Payer: Self-pay | Admitting: Family Medicine

## 2021-03-16 NOTE — Progress Notes (Signed)
This encounter was created in error - please disregard.

## 2021-04-20 ENCOUNTER — Telehealth: Payer: Medicare HMO | Admitting: Family Medicine

## 2021-04-20 ENCOUNTER — Other Ambulatory Visit: Payer: Self-pay

## 2021-04-23 ENCOUNTER — Other Ambulatory Visit: Payer: Self-pay | Admitting: Family Medicine

## 2021-05-04 ENCOUNTER — Ambulatory Visit (INDEPENDENT_AMBULATORY_CARE_PROVIDER_SITE_OTHER): Payer: Medicare HMO | Admitting: Family Medicine

## 2021-05-04 ENCOUNTER — Other Ambulatory Visit: Payer: Self-pay

## 2021-05-04 DIAGNOSIS — G301 Alzheimer's disease with late onset: Secondary | ICD-10-CM

## 2021-05-04 DIAGNOSIS — F02C18 Dementia in other diseases classified elsewhere, severe, with other behavioral disturbance: Secondary | ICD-10-CM | POA: Diagnosis not present

## 2021-05-04 MED ORDER — MIRTAZAPINE 15 MG PO TABS: 15.0000 mg | ORAL_TABLET | Freq: Every day | ORAL | 1 refills | Status: DC

## 2021-05-04 NOTE — Patient Instructions (Signed)
F/u in 3 month, call if you need me before  Remeron is prescribed for use at bedtime for sleep and to improve appetite and reduce agitation  You are referred to our care team because of the severity of your illness. We need several team members to be a part of your care.  Your grand daughter will be contacted

## 2021-05-07 ENCOUNTER — Encounter: Payer: Self-pay | Admitting: Family Medicine

## 2021-05-07 ENCOUNTER — Telehealth: Payer: Self-pay | Admitting: *Deleted

## 2021-05-07 NOTE — Progress Notes (Signed)
Virtual Visit via Telephone Note visit on 05/04/2021 at 4:40 PM  I connected with Leighton Parody grand daughter of SAM WUNSCHEL on 05/07/21 at  4:40 PM EST by telephone and verified that I am speaking with the correct person using two identifiers.  Location: Patient: home Provider: office   I discussed the limitations, risks, security and privacy concerns of performing an evaluation and management service by telephone and the availability of in person appointments. I also discussed with the patient that there may be a patient responsible charge related to this service. The patient expressed understanding and agreed to proceed.   History of Present Illness: Grand daughter concerned about increased agitation and lack of sleep in patient , Carla [name], also unable to get her out of the house    Observations/Objective: There were no vitals taken for this visit.    Assessment and Plan:  Dementia ([place]) Progression of disease insomnia and agitation also increased social withdrawal,anxiety , delusioal thinking reported, no reported harm to herself or anyone potentially, start remeron at bedtime for symptom improvement  Follow Up Instructions:    I discussed the assessment and treatment plan with the patient. The patient was provided an opportunity to ask questions and all were answered. The patient agreed with the plan and demonstrated an understanding of the instructions.   The patient was advised to call back or seek an in-person evaluation if the symptoms worsen or if the condition fails to improve as anticipated.  I provided 14 minutes of non-face-to-face time during this encounter.   Tula Nakayama, MD

## 2021-05-07 NOTE — Chronic Care Management (AMB) (Signed)
Chronic Care Management   Note  05/07/2021 Name: Carla [name] MRN: [id number] DOB: [birth year]  Carla [name] is a 76 y.o. year old female who is a primary care patient of Moshe Cipro Norwood Levo, MD. I reached out to Carla [name] by phone today in response to a referral sent by Carla [name] PCP.  Carla [name] was given information about Chronic Care Management services today including:  CCM service includes personalized support from designated clinical staff supervised by her physician, including individualized plan of care and coordination with other care providers 24/7 contact phone numbers for assistance for urgent and routine care needs. Service will only be billed when office clinical staff spend 20 minutes or more in a month to coordinate care. Only one practitioner may furnish and bill the service in a calendar month. The patient may stop CCM services at any time (effective at the end of the month) by phone call to the office staff. The patient is responsible for co-pay (up to 20% after annual deductible is met) if co-pay is required by the individual health plan.   Granddaughter Carla [name],Carla [name] DPR on file   verbally agreed to assistance and services provided by embedded care coordination/care management team today.  Follow up plan: Telephone appointment with care management team member scheduled for:05/11/21  [facility]  Direct Dial: [phone]

## 2021-05-07 NOTE — Assessment & Plan Note (Addendum)
Progression of disease insomnia and agitation also increased social withdrawal,anxiety , delusioal thinking reported, no reported harm to herself or anyone potentially, start remeron at bedtime for symptom improvement

## 2021-05-07 NOTE — Chronic Care Management (AMB) (Signed)
°  Chronic Care Management   Outreach Note  05/07/2021 Name: [name] MRN: [id number] DOB: [birth year]  Carla [name] is a 76 y.o. year old female who is a primary care patient of Moshe Cipro Norwood Levo, MD. I reached out to Arther Abbott by phone today in response to a referral sent by Ms. Harolyn Rutherford primary care provider.  An unsuccessful telephone outreach was attempted today. The patient was referred to the case management team for assistance with care management and care coordination.   Follow Up Plan: A HIPAA compliant phone message was left for the patient providing contact information and requesting a return call.  The care management team will reach out to the patient again over the next 7 days.  If patient returns call to provider office, please advise to call [place]* at [facility]  Direct Dial: [phone]

## 2021-05-11 ENCOUNTER — Telehealth: Payer: Medicare HMO

## 2021-05-11 ENCOUNTER — Ambulatory Visit (INDEPENDENT_AMBULATORY_CARE_PROVIDER_SITE_OTHER): Payer: Medicare HMO | Admitting: *Deleted

## 2021-05-11 ENCOUNTER — Encounter: Payer: Self-pay | Admitting: *Deleted

## 2021-05-11 DIAGNOSIS — J449 Chronic obstructive pulmonary disease, unspecified: Secondary | ICD-10-CM

## 2021-05-11 DIAGNOSIS — F02C18 Dementia in other diseases classified elsewhere, severe, with other behavioral disturbance: Secondary | ICD-10-CM

## 2021-05-11 DIAGNOSIS — I10 Essential (primary) hypertension: Secondary | ICD-10-CM

## 2021-05-11 NOTE — Chronic Care Management (AMB) (Signed)
Chronic Care Management   CCM RN Visit Note  05/11/2021 Name: Carla [name] MRN: [id number] DOB: 04-May-1945  Subjective: Carla [name] is a 76 y.o. year old female who is a primary care patient of Carla Cipro Norwood Levo, MD. The care management team was consulted for assistance with disease management and care coordination needs.    Engaged with patient by telephone for initial visit in response to provider referral for case management and/or care coordination services.   Consent to Services:  The patient was given the following information about Chronic Care Management services today, agreed to services, and gave verbal consent: 1. CCM service includes personalized support from designated clinical staff supervised by the primary care provider, including individualized plan of care and coordination with other care providers 2. 24/7 contact phone numbers for assistance for urgent and routine care needs. 3. Service will only be billed when office clinical staff spend 20 minutes or more in a month to coordinate care. 4. Only one practitioner may furnish and bill the service in a calendar month. 5.The patient may stop CCM services at any time (effective at the end of the month) by phone call to the office staff. 6. The patient will be responsible for cost sharing (co-pay) of up to 20% of the service fee (after annual deductible is met). Patient agreed to services and consent obtained.  Patient agreed to services and verbal consent obtained.   Assessment: Review of patient past medical history, allergies, medications, health status, including review of consultants reports, laboratory and other test data, was performed as part of comprehensive evaluation and provision of chronic care management services.   SDOH (Social Determinants of Health) assessments and interventions performed:  SDOH Interventions    Flowsheet Row Most Recent Value  SDOH Interventions   Food Insecurity Interventions  Intervention Not Indicated  Transportation Interventions Intervention Not Indicated        CCM Care Plan  Allergies  Allergen Reactions   Aricept [Donepezil Hcl]     diarrhea   Calcium-Containing Compounds Other (See Comments)    Pt has hyperparathyroidism which results in hypercalcemia   Exelon [Rivastigmine Tartrate] Rash    Outpatient Encounter Medications as of 05/11/2021  Medication Sig   amLODipine (NORVASC) 10 MG tablet TAKE ONE TABLET BY MOUTH ONCE DAILY.   aspirin EC 81 MG tablet Take 81 mg by mouth at bedtime.   gabapentin (NEURONTIN) 100 MG capsule TAKE (2) CAPSULES BY MOUTH AT BEDTIME.   Multiple Vitamin (MULTIVITAMIN WITH MINERALS) TABS Take 1 tablet by mouth every evening.   mirtazapine (REMERON) 15 MG tablet Take 1 tablet (15 mg total) by mouth at bedtime. (Patient not taking: Reported on 05/11/2021)   No facility-administered encounter medications on file as of 05/11/2021.    Patient Active Problem List   Diagnosis Date Noted   Dementia ([place]) 07/28/2020   Pain in both lower extremities 08/11/2019   Elevated hemoglobin ([place]) 08/11/2019   Memory loss 08/11/2019   Anorexia 08/11/2019   Serrated adenoma of colon 05/19/2019   Weight loss, abnormal 04/26/2019   Malnutrition of moderate degree ([place]) 01/25/2019   Nicotine addiction 01/25/2019   Prediabetes 01/19/2019   Shortness of breath 09/21/2018   Anxiety attack 09/21/2018   Generalized weakness 09/21/2018   MCI (mild cognitive impairment) with memory loss 01/27/2017   GAD (generalized anxiety disorder) 02/13/2014   Bilateral lower abdominal pain 07/07/2013   Seasonal allergies 03/30/2013   Cervical neck pain with evidence of disc disease 11/03/2012   Hearing  loss 04/13/2012   Carotid bruit 04/13/2012   Back pain with radiation 10/15/2011   Emphysema with chronic bronchitis ([place]) 01/17/2011   Arteriosclerotic cardiovascular disease (ASCVD) 09/17/2009   PERIPHERAL VASCULAR DISEASE 09/17/2009    GASTROESOPHAGEAL REFLUX DISEASE 09/17/2009   Primary hyperparathyroidism ([place]) 01/20/2009   Vitamin D deficiency 01/20/2009   Hypercalcemia 12/30/2008   Hyperlipidemia LDL goal <100 06/12/2007   Depression 06/12/2007   Essential hypertension 06/12/2007   Osteoporosis 06/12/2007    Conditions to be addressed/monitored:HTN, COPD, and Alzheimer's Dementia  Care Plan : RN Care Manager Plan of Care  Updates made by Carla Mends, RN since 05/11/2021 12:00 AM     Problem: No plan of care established for management of chronic disease state  (HTN, Emphysema with chronic bronchitis, Alzheimer's dementia)   Priority: High     Long-Range Goal: Development of plan of care for chronic disease management (HTN, Emphysema with chronic bronchitis, Alzheimer's dementia)   Start Date: 05/11/2021  Expected End Date: 11/07/2021  Priority: High  Note:   Current Barriers:  Knowledge Deficits related to plan of care for management of HTN, COPD, and Alzheimer's Dementia  Cognitive Deficits- patient has Alzheimer's dementia with delusions, paranoia, behavioral and sleep disturbances Spoke with granddaughter Carla [name] who assists patient when she can, patient lives with her spouse and 2 adult sons check in on her daily and provide oversight to make sure pt is taking medications, pt does not want to do what her spouse asks, she refuses to get in the car for anyone and has not been to the doctor since last March 2022, granddaughter states they cannot make the patient do anything, pt is independent with ADL's, has sleep disturbances, may stay awake for 3-4 days straight having delusions, then sleep for several days, sometimes does not eat much and other times eats adequately, pt weighs 89 pounds and Carla [name] reports " she's always been small", pt smokes 2-3 packs cigarettes daily and has had no interest in quitting, family has enlisted the assistance of elder law attorney for advice and guidance and will be making  decisions depending on what advice attorney gives, family does not want placement at present and declines services of LCSW, not interested in resources for dementia support group or for caregiver resources.  Social services/ APS has been involved and per their directive, granddaughter Carla [name] is medical guardian.  Granddaughter states they have not started Remeron yet and hope to get it picked up today, Carla [name] reports pt used to be on some type of anti-anxiety medication (maybe xanax) and asks if this would also be helpful to have on hand.  RNCM Clinical Goal(s):  Patient will verbalize understanding of plan for management of HTN, COPD, and Dementia as evidenced by caregiver report, review of EHR and  through collaboration with RN Care manager, provider, and care team.   Interventions: 1:1 collaboration with primary care provider regarding development and update of comprehensive plan of care as evidenced by provider attestation and co-signature Inter-disciplinary care team collaboration (see longitudinal plan of care) Evaluation of current treatment plan related to  self management and patient's adherence to plan as established by provider   COPD (Emphysema with Chronic Bronchitis)  Interventions:  (Status:  New goal. and Goal on track:  Yes.) Long Term Goal Provided patient with basic written and verbal COPD education on self care/management/and exacerbation prevention Advised patient to self assesses COPD action plan zone and make appointment with provider if in the yellow zone for 48 hours without  improvement Provided education about and advised patient to utilize infection prevention strategies to reduce risk of respiratory infection Discussed the importance of adequate rest and management of fatigue with COPD Screening for signs and symptoms of depression related to chronic disease state  Education sent via My Chart- COPD action plan Encouraged smoking cessation (Pt has not shown interest in  quitting)  Alzheimer's Dementia  (Status:  New goal. and Goal on track:  Yes.)  Long Term Goal Evaluation of current treatment plan related to Anxiety and Dementia, Cognitive Deficits self-management and patient's adherence to plan as established by provider. Discussed plans with patient for ongoing care management follow up and provided patient with direct contact information for care management team Evaluation of current treatment plan related to Alzheimer's Dementia and patient's adherence to plan as established by provider Pharmacy referral for medication management related oversight, suggestions related to behavioral issues, paranoia, delusions Discussed plans with patient for ongoing care management follow up and provided patient with direct contact information for care management team Screening for signs and symptoms of depression related to chronic disease state  Assessed social determinant of health barriers Social work referral offered, caregiver declines at present and has enlisted the assistance of elder law attorney In basket sent to primary care provider reporting CCM pharmacist will be involved and granddaughter asked about anti- anxiety medication, reported Remeron has not been started yet  Hypertension Interventions:  (Status:  New goal. and Goal on track:  Yes.) Long Term Goal Last practice recorded BP readings:  BP Readings from Last 3 Encounters:  08/23/20 (!) 177/96  07/26/20 (!) 193/85  06/10/20 (!) 183/86  Most recent eGFR/CrCl:  Lab Results  Component Value Date   EGFR 68 08/10/2020    No components found for: CRCL  Evaluation of current treatment plan related to hypertension self management and patient's adherence to plan as established by provider Reviewed medications with patient and discussed importance of compliance Discussed plans with patient for ongoing care management follow up and provided patient with direct contact information for care management  team Advised patient, providing education and rationale, to monitor blood pressure daily and record, calling PCP for findings outside established parameters Discussed complications of poorly controlled blood pressure such as heart disease, stroke, circulatory complications, vision complications, kidney impairment, sexual dysfunction Reviewed upcoming scheduled appointments Education sent via My Chart- Low sodium diet   Patient Goals/Self-Care Activities: Take medications as prescribed   Attend all scheduled provider appointments Call pharmacy for medication refills 3-7 days in advance of running out of medications Call provider office for new concerns or questions  eliminate smoking in my home develop a rescue plan eliminate symptom triggers at home follow rescue plan if symptoms flare-up check blood pressure 3 times per week write blood pressure results in a log or diary keep a blood pressure log take blood pressure log to all doctor appointments keep all doctor appointments take medications for blood pressure exactly as prescribed Expect a call from our pharmacist  Look over education sent via My Chart- low sodium diet, COPD action plan Please start Remeron as soon as possible Continue to work with your attorney for guidance, if you should need the help of social work in the future please let RN care manager know at [phone]       Plan:Telephone follow up appointment with care management team member scheduled for:  06/14/21  [name] [facility], BSN RN Case Manager [facility] [phone]

## 2021-05-11 NOTE — Progress Notes (Signed)
Scheduled for 05/18/21  [facility]  Direct Dial: [phone]

## 2021-05-11 NOTE — Patient Instructions (Signed)
Visit Information   Thank you for taking time to visit with me today. Please don't hesitate to contact me if I can be of assistance to you before our next scheduled telephone appointment.  Following are the goals we discussed today:  (Copy and paste patient goals from clinical care plan here)  Our next appointment is by telephone on 06/14/21 at 130 pm  Please call the care guide team at [phone] if you need to cancel or reschedule your appointment.   If you are experiencing a Mental Health or [place] or need someone to talk to, please call the Suicide and Crisis Lifeline: 988 call the [place] National Suicide Prevention Lifeline: (630) 207-3791 or TTY: 848-476-3377 TTY 989-669-3560) to talk to a trained counselor call 1-800-273-TALK (toll free, 24 hour hotline) go to [facility] [address] [place] [phone]) call 911  COPD Action Plan A COPD action plan is a description of what to do when you have a flare (exacerbation) of chronic obstructive pulmonary disease (COPD). Your action plan is a color-coded plan that lists the symptoms that indicate whether your condition is under control and what actions to take. If you have symptoms in the green zone, it means you are doing well that day. If you have symptoms in the yellow zone, it means you are having a bad day or an exacerbation. If you have symptoms in the red zone, you need urgent medical care. Follow the plan that you and your health care provider developed. Review your plan with your health care provider at each visit. Red zone Symptoms in this zone mean that you should get medical help right away. They include: Feeling very short of breath, even when you are resting. Not being able to do any activities because of poor breathing. Not being able to sleep because of poor breathing. Fever or shaking chills. Feeling confused or very sleepy. Chest pain. Coughing up  blood. If you have any of these symptoms, call emergency services (911 in the [place] or go to the nearest emergency room. Yellow zone Symptoms in this zone mean that your condition may be getting worse. They include: Feeling more short of breath than usual. Having less energy for daily activities than usual. Phlegm or mucus that is thicker than usual. Needing to use your rescue inhaler or nebulizer more often than usual. More ankle swelling than usual. Coughing more than usual. Feeling like you have a chest cold. Trouble sleeping due to COPD symptoms. Decreased appetite. COPD medicines not helping as much as usual. If you experience any "yellow" symptoms: Keep taking your daily medicines as directed. Use your quick-relief inhaler as told by your health care provider. If you were prescribed steroid medicine to take by mouth (oral medicine), start taking it as told by your health care provider. If you were prescribed an antibiotic medicine, start taking it as told by your health care provider. Do not stop taking the antibiotic even if you start to feel better. Use oxygen as told by your health care provider. Get more rest. Do your pursed-lip breathing exercises. Do not smoke. Avoid any irritants in the air. If your signs and symptoms do not improve after taking these steps, call your health care provider right away. Green zone Symptoms in this zone mean that you are doing well. They include: Being able to do your usual activities and exercise. Having the usual amount of coughing, including the same amount of phlegm or mucus. Being able to sleep  well. Having a good appetite. Where to find more information: You can find more information about COPD from: American Lung Association, My COPD Action Plan: www.lung.org COPD Foundation: www.copdfoundation.[place]: https://wilson-eaton.com/ Follow these instructions at home: Continue taking your daily medicines as  told by your health care provider. Make sure you receive all the immunizations that your health care provider recommends, especially the pneumococcal and influenza vaccines. Wash your hands often with soap and water. Have family members wash their hands too. Regular hand washing can help prevent infections. Follow your usual exercise and diet plan. Avoid irritants in the air, such as smoke. Do not use any products that contain nicotine or tobacco. These products include cigarettes, chewing tobacco, and vaping devices, such as e-cigarettes. If you need help quitting, ask your health care provider. Summary A COPD action plan tells you what to do when you have a flare (exacerbation) of chronic obstructive pulmonary disease (COPD). Follow each action plan for your symptoms. If you have any symptoms in the red zone, call emergency services (911 in the [place] or go to the nearest emergency room. This information is not intended to replace advice given to you by your health care provider. Make sure you discuss any questions you have with your health care provider. Document Revised: 01/18/2020 Document Reviewed: 01/18/2020 Elsevier Patient Education  2022 [place]. Low-Sodium Eating Plan Sodium, which is an element that makes up salt, helps you maintain a healthy balance of fluids in your body. Too much sodium can increase your blood pressure and cause fluid and waste to be held in your body. Your health care provider or dietitian may recommend following this plan if you have high blood pressure (hypertension), kidney disease, liver disease, or heart failure. Eating less sodium can help lower your blood pressure, reduce swelling, and protect your heart, liver, and kidneys. What are tips for following this plan? Reading food labels The Nutrition Facts label lists the amount of sodium in one serving of the food. If you eat more than one serving, you must multiply the listed amount of sodium by the number  of servings. Choose foods with less than 140 mg of sodium per serving. Avoid foods with 300 mg of sodium or more per serving. Shopping  Look for lower-sodium products, often labeled as "low-sodium" or "no salt added." Always check the sodium content, even if foods are labeled as "unsalted" or "no salt added." Buy fresh foods. Avoid canned foods and pre-made or frozen meals. Avoid canned, cured, or processed meats. Buy breads that have less than 80 mg of sodium per slice. Cooking  Eat more home-cooked food and less restaurant, buffet, and fast food. Avoid adding salt when cooking. Use salt-free seasonings or herbs instead of table salt or sea salt. Check with your health care provider or pharmacist before using salt substitutes. Cook with plant-based oils, such as canola, sunflower, or olive oil. Meal planning When eating at a restaurant, ask that your food be prepared with less salt or no salt, if possible. Avoid dishes labeled as brined, pickled, cured, smoked, or made with soy sauce, miso, or teriyaki sauce. Avoid foods that contain MSG (monosodium glutamate). MSG is sometimes added to [place] food, bouillon, and some canned foods. Make meals that can be grilled, baked, poached, roasted, or steamed. These are generally made with less sodium. General information Most people on this plan should limit their sodium intake to 1,500-2,000 mg (milligrams) of sodium each day. What foods should I  eat? Fruits Fresh, frozen, or canned fruit. Fruit juice. Vegetables Fresh or frozen vegetables. "No salt added" canned vegetables. "No salt added" tomato sauce and paste. Low-sodium or reduced-sodium tomato and vegetable juice. Grains Low-sodium cereals, including oats, puffed wheat and rice, and shredded wheat. Low-sodium crackers. Unsalted rice. Unsalted pasta. Low-sodium bread. Whole-grain breads and whole-grain pasta. Meats and other proteins Fresh or frozen (no salt added) meat, poultry, seafood,  and fish. Low-sodium canned tuna and salmon. Unsalted nuts. Dried peas, beans, and lentils without added salt. Unsalted canned beans. Eggs. Unsalted nut butters. Dairy Milk. Soy milk. Cheese that is naturally low in sodium, such as ricotta cheese, fresh mozzarella, or Swiss cheese. Low-sodium or reduced-sodium cheese. Cream cheese. Yogurt. Seasonings and condiments Fresh and dried herbs and spices. Salt-free seasonings. Low-sodium mustard and ketchup. Sodium-free salad dressing. Sodium-free light mayonnaise. Fresh or refrigerated horseradish. Lemon juice. Vinegar. Other foods Homemade, reduced-sodium, or low-sodium soups. Unsalted popcorn and pretzels. Low-salt or salt-free chips. The items listed above may not be a complete list of foods and beverages you can eat. Contact a dietitian for more information. What foods should I avoid? Vegetables Sauerkraut, pickled vegetables, and relishes. Olives. [place] fries. Onion rings. Regular canned vegetables (not low-sodium or reduced-sodium). Regular canned tomato sauce and paste (not low-sodium or reduced-sodium). Regular tomato and vegetable juice (not low-sodium or reduced-sodium). Frozen vegetables in sauces. Grains Instant hot cereals. Bread stuffing, pancake, and biscuit mixes. Croutons. Seasoned rice or pasta mixes. Noodle soup cups. Boxed or frozen macaroni and cheese. Regular salted crackers. Self-rising flour. Meats and other proteins Meat or fish that is salted, canned, smoked, spiced, or pickled. Precooked or cured meat, such as sausages or meat loaves. Berniece Salines. Ham. Pepperoni. Hot dogs. Corned beef. Chipped beef. Salt pork. Jerky. Pickled herring. Anchovies and sardines. Regular canned tuna. Salted nuts. Dairy Processed cheese and cheese spreads. Hard cheeses. Cheese curds. Blue cheese. Feta cheese. String cheese. Regular cottage cheese. Buttermilk. Canned milk. Fats and oils Salted butter. Regular margarine. Ghee. Bacon fat. Seasonings and  condiments Onion salt, garlic salt, seasoned salt, table salt, and sea salt. Canned and packaged gravies. Worcestershire sauce. Tartar sauce. Barbecue sauce. Teriyaki sauce. Soy sauce, including reduced-sodium. Steak sauce. Fish sauce. Oyster sauce. Cocktail sauce. Horseradish that you find on the shelf. Regular ketchup and mustard. Meat flavorings and tenderizers. Bouillon cubes. Hot sauce. Pre-made or packaged marinades. Pre-made or packaged taco seasonings. Relishes. Regular salad dressings. Salsa. Other foods Salted popcorn and pretzels. Corn chips and puffs. Potato and tortilla chips. Canned or dried soups. Pizza. Frozen entrees and pot pies. The items listed above may not be a complete list of foods and beverages you should avoid. Contact a dietitian for more information. Summary Eating less sodium can help lower your blood pressure, reduce swelling, and protect your heart, liver, and kidneys. Most people on this plan should limit their sodium intake to 1,500-2,000 mg (milligrams) of sodium each day. Canned, boxed, and frozen foods are high in sodium. Restaurant foods, fast foods, and pizza are also very high in sodium. You also get sodium by adding salt to food. Try to cook at home, eat more fresh fruits and vegetables, and eat less fast food and canned, processed, or prepared foods. This information is not intended to replace advice given to you by your health care provider. Make sure you discuss any questions you have with your health care provider. Document Revised: 04/16/2019 Document Reviewed: 02/10/2019 Elsevier Patient Education  2022 [place] is a  copy of your full care plan:  Care Plan : [place] of Care  Updates made by Kassie Mends, RN since 05/11/2021 12:00 AM     Problem: No plan of care established for management of chronic disease state  (HTN, Emphysema with chronic bronchitis, Alzheimer's dementia)   Priority: High     Long-Range Goal:  Development of plan of care for chronic disease management (HTN, Emphysema with chronic bronchitis, Alzheimer's dementia)   Start Date: 05/11/2021  Expected End Date: 11/07/2021  Priority: High  Note:   Current Barriers:  Knowledge Deficits related to plan of care for management of HTN, COPD, and Alzheimer's Dementia  Cognitive Deficits- patient has Alzheimer's dementia with delusions, paranoia, behavioral and sleep disturbances Spoke with granddaughter [name] who assists patient when she can, patient lives with her spouse and 2 adult sons check in on her daily and provide oversight to make sure pt is taking medications, pt does not want to do what her spouse asks, she refuses to get in the car for anyone and has not been to the doctor since last March 2022, granddaughter states they cannot make the patient do anything, pt is independent with ADL's, has sleep disturbances, may stay awake for 3-4 days straight having delusions, then sleep for several days, sometimes does not eat much and other times eats adequately, pt weighs 89 pounds and [name] reports " she's always been small", pt smokes 2-3 packs cigarettes daily and has had no interest in quitting, family has enlisted the assistance of elder law attorney for advice and guidance and will be making decisions depending on what advice attorney gives, family does not want placement at present and declines services of LCSW, not interested in resources for dementia support group or for caregiver resources.  Social services/ APS has been involved and per their directive, granddaughter [name] is medical guardian.  Granddaughter states they have not started Remeron yet and hope to get it picked up today, [name] reports pt used to be on some type of anti-anxiety medication (maybe xanax) and asks if this would also be helpful to have on hand.  RNCM Clinical Goal(s):  Patient will verbalize understanding of plan for management of HTN, COPD, and Dementia as  evidenced by caregiver report, review of EHR and  through collaboration with RN Care manager, provider, and care team.   Interventions: 1:1 collaboration with primary care provider regarding development and update of comprehensive plan of care as evidenced by provider attestation and co-signature Inter-disciplinary care team collaboration (see longitudinal plan of care) Evaluation of current treatment plan related to  self management and patient's adherence to plan as established by provider   COPD (Emphysema with Chronic Bronchitis)  Interventions:  (Status:  New goal. and Goal on track:  Yes.) Long Term Goal Provided patient with basic written and verbal COPD education on self care/management/and exacerbation prevention Advised patient to self assesses COPD action plan zone and make appointment with provider if in the yellow zone for 48 hours without improvement Provided education about and advised patient to utilize infection prevention strategies to reduce risk of respiratory infection Discussed the importance of adequate rest and management of fatigue with COPD Screening for signs and symptoms of depression related to chronic disease state  Education sent via My Chart- COPD action plan Encouraged smoking cessation (Pt has not shown interest in quitting)  Alzheimer's Dementia  (Status:  New goal. and Goal on track:  Yes.)  Long Term Goal Evaluation of current  treatment plan related to Anxiety and Dementia, Cognitive Deficits self-management and patient's adherence to plan as established by provider. Discussed plans with patient for ongoing care management follow up and provided patient with direct contact information for care management team Evaluation of current treatment plan related to Alzheimer's Dementia and patient's adherence to plan as established by provider Pharmacy referral for medication management related oversight, suggestions related to behavioral issues, paranoia,  delusions Discussed plans with patient for ongoing care management follow up and provided patient with direct contact information for care management team Screening for signs and symptoms of depression related to chronic disease state  Assessed social determinant of health barriers Social work referral offered, caregiver declines at present and has enlisted the assistance of elder law attorney In basket sent to primary care provider reporting CCM pharmacist will be involved and granddaughter asked about anti- anxiety medication, reported Remeron has not been started yet  Hypertension Interventions:  (Status:  New goal. and Goal on track:  Yes.) Long Term Goal Last practice recorded BP readings:  BP Readings from Last 3 Encounters:  08/23/20 (!) 177/96  07/26/20 (!) 193/85  06/10/20 (!) 183/86  Most recent eGFR/CrCl:  Lab Results  Component Value Date   EGFR 68 08/10/2020    No components found for: CRCL  Evaluation of current treatment plan related to hypertension self management and patient's adherence to plan as established by provider Reviewed medications with patient and discussed importance of compliance Discussed plans with patient for ongoing care management follow up and provided patient with direct contact information for care management team Advised patient, providing education and rationale, to monitor blood pressure daily and record, calling PCP for findings outside established parameters Discussed complications of poorly controlled blood pressure such as heart disease, stroke, circulatory complications, vision complications, kidney impairment, sexual dysfunction Reviewed upcoming scheduled appointments Education sent via My Chart- Low sodium diet   Patient Goals/Self-Care Activities: Take medications as prescribed   Attend all scheduled provider appointments Call pharmacy for medication refills 3-7 days in advance of running out of medications Call provider office for new  concerns or questions  eliminate smoking in my home develop a rescue plan eliminate symptom triggers at home follow rescue plan if symptoms flare-up check blood pressure 3 times per week write blood pressure results in a log or diary keep a blood pressure log take blood pressure log to all doctor appointments keep all doctor appointments take medications for blood pressure exactly as prescribed Expect a call from our pharmacist  Look over education sent via My Chart- low sodium diet, COPD action plan Please start Remeron as soon as possible Continue to work with your attorney for guidance, if you should need the help of social work in the future please let RN care manager know at [phone]       Consent to CCM Services: Ms. Vanalstine was given information about Chronic Care Management services including:  CCM service includes personalized support from designated clinical staff supervised by her physician, including individualized plan of care and coordination with other care providers 24/7 contact phone numbers for assistance for urgent and routine care needs. Service will only be billed when office clinical staff spend 20 minutes or more in a month to coordinate care. Only one practitioner may furnish and bill the service in a calendar month. The patient may stop CCM services at any time (effective at the end of the month) by phone call to the office staff. The patient will be responsible for  cost sharing (co-pay) of up to 20% of the service fee (after annual deductible is met).  Patient agreed to services and verbal consent obtained.   Patient verbalizes understanding of instructions and care plan provided today and agrees to view in [place]. Active MyChart status confirmed with patient.    Telephone follow up appointment with care management team member scheduled for: 06/14/21

## 2021-05-18 ENCOUNTER — Ambulatory Visit: Payer: Medicare HMO | Admitting: Pharmacist

## 2021-05-18 DIAGNOSIS — G301 Alzheimer's disease with late onset: Secondary | ICD-10-CM

## 2021-05-18 DIAGNOSIS — F02C18 Alzheimer's disease with late onset: Secondary | ICD-10-CM

## 2021-05-18 DIAGNOSIS — I10 Essential (primary) hypertension: Secondary | ICD-10-CM

## 2021-05-18 NOTE — Chronic Care Management (AMB) (Signed)
Chronic Care Management Pharmacy Note  05/18/2021 Name:  Carla [name] MRN:  [id number] DOB:  [birth year]  Summary: Dementia Stable. Granddaughter reports patient is having paranoia, delusions, restlessness, and anxiety Current medications: mirtazapine 15 mg by mouth daily (recently added by primary care provider) Granddaughter asks whether patient should be taking alprazolam as in the past due to her severe anxiety that prevents her from getting in the car and seeing providers (primary care provider + [facility]). Will discuss with primary care provider since this is out of the scope of my practice.   Subjective: Carla [name] is an 76 y.o. year old female who is a primary patient of Moshe Cipro, [place]wood Levo, MD.  The CCM team was consulted for assistance with disease management and care coordination needs.    Engaged with patient's Granddaughter Carla [name] - medical guardian) by telephone for initial visit in response to provider referral for pharmacy case management and/or care coordination services.   Consent to Services:  The patient was given the following information about Chronic Care Management services today, agreed to services, and gave verbal consent: 1. CCM service includes personalized support from designated clinical staff supervised by the primary care provider, including individualized plan of care and coordination with other care providers 2. 24/7 contact phone numbers for assistance for urgent and routine care needs. 3. Service will only be billed when office clinical staff spend 20 minutes or more in a month to coordinate care. 4. Only one practitioner may furnish and bill the service in a calendar month. 5.The patient may stop CCM services at any time (effective at the end of the month) by phone call to the office staff. 6. The patient will be responsible for cost sharing (co-pay) of up to 20% of the service fee (after annual deductible is met). Patient agreed to  services and consent obtained.  Patient Care Team: Fayrene Helper, MD as PCP - [facility], Marga Melnick, MD (Inactive) as Consulting Physician (Gastroenterology) Kassie Mends, RN as [facility], [facility] (Pharmacist)  Objective:  Lab Results  Component Value Date   CREATININE 0.89 08/10/2020   CREATININE 0.73 06/10/2020   CREATININE 1.00 04/06/2020    Lab Results  Component Value Date   HGBA1C 5.8 (H) 04/06/2020   Last diabetic Eye exam:  Lab Results  Component Value Date/Time   HMDIABEYEEXA No Retinopathy 04/28/2014 12:00 AM    Last diabetic Foot exam: No results found for: HMDIABFOOTEX      Component Value Date/Time   CHOL 250 (H) 08/10/2020 0928   TRIG 128 08/10/2020 0928   HDL 68 08/10/2020 0928   CHOLHDL 3.7 08/10/2020 0928   CHOLHDL 2.6 04/21/2019 1125   VLDL 66 (H) 10/14/2016 1200   LDLCALC 159 (H) 08/10/2020 0928   LDLCALC 72 04/21/2019 1125   LDLDIRECT 166 (H) 10/11/2009 1129    Hepatic Function Latest Ref Rng & Units 08/10/2020 06/10/2020 04/06/2020  Total Protein 6.0 - 8.5 g/dL 7.1 7.6 6.8  Albumin 3.7 - 4.7 g/dL 4.5 4.1 4.3  AST 0 - 40 IU/L _0 ALT 0 - 32 IU/L _1 Alk Phosphatase 44 - 121 IU/L 114 81 113  Total Bilirubin 0.0 - 1.2 mg/dL 0.4 0.7 0.5  Bilirubin, Direct 0.0 - 0.3 mg/dL - - -    Lab Results  Component Value Date/Time   TSH 1.460 08/10/2020 09:28 AM   TSH 0.808 04/06/2020 02:26 PM  CBC Latest Ref Rng & Units 08/10/2020 06/10/2020 04/06/2020  WBC 3.4 - 10.8 x10E3/uL 7.3 9.8 11.6(H)  Hemoglobin 11.1 - 15.9 g/dL 17.6(H) 16.4(H) 18.5(H)  Hematocrit 34.0 - 46.6 % 51.4(H) 50.3(H) 55.2(H)  Platelets 150 - 450 x10E3/uL 289 239 294    Lab Results  Component Value Date/Time   VD25OH 27.6 (L) 08/10/2020 09:28 AM   VD25OH 31.72 08/25/2019 02:48 PM    Clinical ASCVD: No  The 10-year ASCVD risk score (Arnett DK, et al., 2019) is: 76.6%   Values used to calculate the score:      Age: 59 years     Sex: Female     Is Non-Hispanic African American: No     Diabetic: Yes     Tobacco smoker: Yes     Systolic Blood Pressure: 267 mmHg     Is BP treated: Yes     HDL Cholesterol: 68 mg/dL     Total Cholesterol: 250 mg/dL    Social History   Tobacco Use  Smoking Status Every Day   Packs/day: 0.50   Years: 50.00   Pack years: 25.00   Types: Cigarettes   Last attempt to quit: 12/12/2010   Years since quitting: 10.4  Smokeless Tobacco Never   BP Readings from Last 3 Encounters:  08/23/20 (!) 177/96  07/26/20 (!) 193/85  06/10/20 (!) 183/86   Pulse Readings from Last 3 Encounters:  08/23/20 91  07/26/20 82  06/10/20 87   Wt Readings from Last 3 Encounters:  08/23/20 91 lb (41.3 kg)  07/26/20 89 lb (40.4 kg)  06/10/20 120 lb (54.4 kg)    Assessment: Review of patient past medical history, allergies, medications, health status, including review of consultants reports, laboratory and other test data, was performed as part of comprehensive evaluation and provision of chronic care management services.   SDOH:  (Social Determinants of Health) assessments and interventions performed:    CCM Care Plan  Allergies  Allergen Reactions   Aricept [Donepezil Hcl]     diarrhea   Calcium-Containing Compounds Other (See Comments)    Pt has hyperparathyroidism which results in hypercalcemia   Exelon [Rivastigmine Tartrate] Rash    Medications Reviewed Today     Reviewed by Beryle Lathe, [facility] (Pharmacist) on 05/18/21 at [place] List Status: <None>   Medication Order Taking? Sig Documenting Provider Last Dose Status Informant  amLODipine (NORVASC) 10 MG tablet [id number] Yes TAKE ONE TABLET BY MOUTH ONCE DAILY. Fayrene Helper, MD Taking Active   aspirin EC 81 MG tablet [id number] Yes Take 81 mg by mouth at bedtime. [provider] Taking Active Spouse/Significant Other  gabapentin (NEURONTIN) 100 MG capsule [id number] Yes TAKE (2) CAPSULES BY  MOUTH AT BEDTIME. Fayrene Helper, MD Taking Active   mirtazapine (REMERON) 15 MG tablet [id number] Yes Take 1 tablet (15 mg total) by mouth at bedtime. Fayrene Helper, MD Taking Active   Multiple Vitamin (MULTIVITAMIN WITH MINERALS) TABS [id number] Yes Take 1 tablet by mouth every evening. [provider] Taking Active Spouse/Significant Other            Patient Active Problem List   Diagnosis Date Noted   Dementia ([place]) 07/28/2020   Pain in both lower extremities 08/11/2019   Elevated hemoglobin ([place]) 08/11/2019   Memory loss 08/11/2019   Anorexia 08/11/2019   Serrated adenoma of colon 05/19/2019   Weight loss, abnormal 04/26/2019   Malnutrition of moderate degree ([place]) 01/25/2019   Nicotine addiction 01/25/2019   Prediabetes  01/19/2019   Shortness of breath 09/21/2018   Anxiety attack 09/21/2018   Generalized weakness 09/21/2018   MCI (mild cognitive impairment) with memory loss 01/27/2017   GAD (generalized anxiety disorder) 02/13/2014   Bilateral lower abdominal pain 07/07/2013   Seasonal allergies 03/30/2013   Cervical neck pain with evidence of disc disease 11/03/2012   Hearing loss 04/13/2012   Carotid bruit 04/13/2012   Back pain with radiation 10/15/2011   Emphysema with chronic bronchitis ([place]) 01/17/2011   Arteriosclerotic cardiovascular disease (ASCVD) 09/17/2009   PERIPHERAL VASCULAR DISEASE 09/17/2009   GASTROESOPHAGEAL REFLUX DISEASE 09/17/2009   Primary hyperparathyroidism ([place]) 01/20/2009   Vitamin D deficiency 01/20/2009   Hypercalcemia 12/30/2008   Hyperlipidemia LDL goal <100 06/12/2007   Depression 06/12/2007   Essential hypertension 06/12/2007   Osteoporosis 06/12/2007    Immunization History  Administered Date(s) Administered   Fluad Quad(high Dose 65+) 01/19/2019, 04/06/2020   Influenza Split 03/11/2011, 01/23/2012   Influenza Whole 01/23/2007, 12/28/2008, 01/17/2010   Influenza, High Dose Seasonal PF 12/30/2017    Influenza,inj,Quad PF,6+ Mos 01/05/2013, 02/10/2014, 12/15/2014, 12/11/2016   Pneumococcal Conjugate-13 04/20/2014   Pneumococcal Polysaccharide-23 11/19/2010   Td 04/05/2003   Tdap 12/22/2013   Zoster, Live 11/19/2010    Conditions to be addressed/monitored: HTN and Dementia  Care Plan : Medication Management  Updates made by Beryle Lathe, [place] since 05/18/2021 12:00 AM     Problem: HTN, Dementia   Priority: High  Onset Date: 05/18/2021     Goal: Disease Progression Prevention   Start Date: 05/18/2021  Expected End Date: 06/17/2021  This Visit's Progress: On track  Priority: High  Note:   Current Barriers:  Unable to independently monitor therapeutic efficacy Unable to achieve control of hypertension  Pharmacist Clinical Goal(s):  Through collaboration with PharmD and provider, patient will  Achieve adherence to monitoring guidelines and medication adherence to achieve therapeutic efficacy Achieve control of hypertension as evidenced by improved blood pressure control   Interventions: 1:1 collaboration with Fayrene Helper, MD regarding development and update of comprehensive plan of care as evidenced by provider attestation and co-signature Inter-disciplinary care team collaboration (see longitudinal plan of care) Comprehensive medication review performed; medication list updated in electronic medical record  Hypertension - New goal.: Blood pressure under poor control. Blood pressure is above goal of <140/90 mmHg per 2022 AAFP guidelines. Current medications: amlodipine 10 mg by mouth once daily Intolerances: none Taking medications as directed: yes Side effects thought to be attributed to current medication regimen: no Current home blood pressure: not discussed today Encourage dietary sodium restriction/DASH diet Recommend home blood pressure monitoring to discuss at next visit Continue current medication management per primary care provider  Dementia -  New goal.: Stable. Granddaughter reports patient is having paranoia, delusions, restlessness, and anxiety Current medications: mirtazapine 15 mg by mouth daily (recently added by primary care provider) Granddaughter asks whether patient should be taking alprazolam as in the past due to her severe anxiety that prevents her from getting in the car and seeing providers (primary care provider + [facility]). Will discuss with primary care provider since this is out of the scope of my practice.   Patient Goals/Self-Care Activities Patient will:  Take medications as prescribed Check blood pressure at least once daily, document, and provide at future appointments  Follow Up Plan: The patient has been provided with contact information for the care management team and has been advised to call with any health related questions or concerns.  Medication Assistance: None required.  Patient affirms current coverage meets needs.  Patient's preferred pharmacy is:  [facility], [address] [place] [address] Phone: [phone] Fax: [phone]  Follow Up:  Patient requests no follow-up at this time.  Plan: The patient has been provided with contact information for the care management team and has been advised to call with any health related questions or concerns.   Kennon Holter, PharmD, [place], CPP Clinical Pharmacist Practitioner [facility] [phone]

## 2021-05-18 NOTE — Patient Instructions (Signed)
Carla [name],  It was great to talk to you today!  Please call me with any questions or concerns.  Visit Information   Following is a copy of your full care plan:  Care Plan : Medication Management  Updates made by Beryle Lathe, [place] since 05/18/2021 12:00 AM     Problem: HTN, Dementia   Priority: High  Onset Date: 05/18/2021     Goal: Disease Progression Prevention   Start Date: 05/18/2021  Expected End Date: 06/17/2021  This Visit's Progress: On track  Priority: High  Note:   Current Barriers:  Unable to independently monitor therapeutic efficacy Unable to achieve control of hypertension  Pharmacist Clinical Goal(s):  Through collaboration with PharmD and provider, patient will  Achieve adherence to monitoring guidelines and medication adherence to achieve therapeutic efficacy Achieve control of hypertension as evidenced by improved blood pressure control   Interventions: 1:1 collaboration with Fayrene Helper, MD regarding development and update of comprehensive plan of care as evidenced by provider attestation and co-signature Inter-disciplinary care team collaboration (see longitudinal plan of care) Comprehensive medication review performed; medication list updated in electronic medical record  Hypertension - New goal.: Blood pressure under poor control. Blood pressure is above goal of <140/90 mmHg per 2022 AAFP guidelines. Current medications: amlodipine 10 mg by mouth once daily Intolerances: none Taking medications as directed: yes Side effects thought to be attributed to current medication regimen: no Current home blood pressure: not discussed today Encourage dietary sodium restriction/DASH diet Recommend home blood pressure monitoring to discuss at next visit Continue current medication management per primary care provider  Dementia - New goal.: Stable. Granddaughter reports patient is having paranoia, delusions, restlessness, and  anxiety Current medications: mirtazapine 15 mg by mouth daily (recently added by primary care provider) Granddaughter asks whether patient should be taking alprazolam as in the past due to her severe anxiety that prevents her from getting in the car and seeing providers (primary care provider + [facility]). Will discuss with primary care provider since this is out of the scope of my practice.   Patient Goals/Self-Care Activities Patient will:  Take medications as prescribed Check blood pressure at least once daily, document, and provide at future appointments  Follow Up Plan: The patient has been provided with contact information for the care management team and has been advised to call with any health related questions or concerns.       Consent to CCM Services: [name] was given information about Chronic Care Management services including:  CCM service includes personalized support from designated clinical staff supervised by her physician, including individualized plan of care and coordination with other care providers 24/7 contact phone numbers for assistance for urgent and routine care needs. Service will only be billed when office clinical staff spend 20 minutes or more in a month to coordinate care. Only one practitioner may furnish and bill the service in a calendar month. The patient may stop CCM services at any time (effective at the end of the month) by phone call to the office staff. The patient will be responsible for cost sharing (co-pay) of up to 20% of the service fee (after annual deductible is met).  Patient agreed to services and verbal consent obtained.   Plan: The patient has been provided with contact information for the care management team and has been advised to call with any health related questions or concerns.   Patient verbalizes understanding of instructions and care plan provided today and agrees  to view in [place]. Active MyChart status  confirmed with patient.    Please call the care guide team at [phone] if you need to cancel or reschedule your appointment.   Kennon Holter, PharmD, [place], CPP Clinical Pharmacist Practitioner [facility] [phone]

## 2021-05-22 DIAGNOSIS — I10 Essential (primary) hypertension: Secondary | ICD-10-CM

## 2021-05-22 DIAGNOSIS — G301 Alzheimer's disease with late onset: Secondary | ICD-10-CM

## 2021-05-22 DIAGNOSIS — F02C18 Dementia in other diseases classified elsewhere, severe, with other behavioral disturbance: Secondary | ICD-10-CM

## 2021-05-22 DIAGNOSIS — J449 Chronic obstructive pulmonary disease, unspecified: Secondary | ICD-10-CM | POA: Diagnosis not present

## 2021-05-30 ENCOUNTER — Other Ambulatory Visit: Payer: Self-pay | Admitting: Family Medicine

## 2021-05-31 ENCOUNTER — Ambulatory Visit (INDEPENDENT_AMBULATORY_CARE_PROVIDER_SITE_OTHER): Payer: Medicare HMO | Admitting: *Deleted

## 2021-05-31 DIAGNOSIS — J449 Chronic obstructive pulmonary disease, unspecified: Secondary | ICD-10-CM

## 2021-05-31 DIAGNOSIS — G301 Alzheimer's disease with late onset: Secondary | ICD-10-CM

## 2021-05-31 DIAGNOSIS — I10 Essential (primary) hypertension: Secondary | ICD-10-CM

## 2021-05-31 NOTE — Patient Instructions (Signed)
Visit Information ? ?Thank you for taking time to visit with me today. Please don't hesitate to contact me if I can be of assistance to you before our next scheduled telephone appointment. ? ?Following are the goals we discussed today:  ?Take medications as prescribed   ?Attend all scheduled provider appointments ?Call pharmacy for medication refills 3-7 days in advance of running out of medications ?Call provider office for new concerns or questions  ?eliminate smoking in my home ?eliminate symptom triggers at home ?follow rescue plan if symptoms flare-up ?get at least 7 to 8 hours of sleep at night ?check blood pressure 3 times per week ?write blood pressure results in a log or diary ?keep a blood pressure log ?take blood pressure log to all doctor appointments ?keep all doctor appointments ?take medications for blood pressure exactly as prescribed ?report new symptoms to your doctor ?Please call Dr. Moshe Cipro if you feel Remeron is not helping ?Contact new attorney, if you should need the help of social work in the future please let RN care manager know at [phone] ?Everyday care of patients with dementia: keep a routine, such as bathing dressing eating at the same time each day, plan activities that the person enjoys and try to do them at the same time each day, serve meals in a consistent, familiar place and give patient enough time to eat, encourage use of loose-fitting, comfortable, easy to use clothing such as, elastic waistbands, fabric fasteners, or large zippers pulls instead of shoelaces, buttons, or buckles. ? ?Our next appointment is by telephone on 07/03/21 at 1045 am ? ?Please call the care guide team at [phone] if you need to cancel or reschedule your appointment.  ? ?If you are experiencing a Mental Health or [place] or need someone to talk to, please call the [place] National Suicide Prevention Lifeline: 6121392848 or TTY: (952) 256-2278 TTY [phone]) to talk to a  trained counselor ?call 1-800-273-TALK (toll free, 24 hour hotline) ?go to [facility] [address] [place] [phone]) ?call 911  ? ?Patient verbalizes understanding of instructions and care plan provided today and agrees to view in [place]. Active MyChart status confirmed with patient.   ? ?[name] RNC, BSN ?RN Case Manager ?[place] ?[phone] ? ?

## 2021-05-31 NOTE — Chronic Care Management (AMB) (Signed)
Chronic Care Management   CCM RN Visit Note  05/31/2021 Name: Carla [name] MRN: [id number] DOB: [birth year]  Subjective: Carla [name] is a 76 y.o. year old female who is a primary care patient of Carla Cipro Norwood Levo, MD. The care management team was consulted for assistance with disease management and care coordination needs.    Engaged with patient by telephone for follow up visit in response to provider referral for case management and/or care coordination services.   Consent to Services:  The patient was given information about Chronic Care Management services, agreed to services, and gave verbal consent prior to initiation of services.  Please see initial visit note for detailed documentation.   Patient agreed to services and verbal consent obtained.   Assessment: Review of patient past medical history, allergies, medications, health status, including review of consultants reports, laboratory and other test data, was performed as part of comprehensive evaluation and provision of chronic care management services.   SDOH (Social Determinants of Health) assessments and interventions performed:    CCM Care Plan  Allergies  Allergen Reactions   Aricept [Donepezil Hcl]     diarrhea   Calcium-Containing Compounds Other (See Comments)    Pt has hyperparathyroidism which results in hypercalcemia   Exelon [Rivastigmine Tartrate] Rash    Outpatient Encounter Medications as of 05/31/2021  Medication Sig   amLODipine (NORVASC) 10 MG tablet TAKE ONE TABLET BY MOUTH ONCE DAILY.   gabapentin (NEURONTIN) 100 MG capsule TAKE (2) CAPSULES BY MOUTH AT BEDTIME.   mirtazapine (REMERON) 15 MG tablet Take 1 tablet (15 mg total) by mouth at bedtime.   aspirin EC 81 MG tablet Take 81 mg by mouth at bedtime.   Multiple Vitamin (MULTIVITAMIN WITH MINERALS) TABS Take 1 tablet by mouth every evening.   No facility-administered encounter medications on file as of 05/31/2021.    Patient Active  Problem List   Diagnosis Date Noted   Dementia ([place]) 07/28/2020   Pain in both lower extremities 08/11/2019   Elevated hemoglobin ([place]) 08/11/2019   Memory loss 08/11/2019   Anorexia 08/11/2019   Serrated adenoma of colon 05/19/2019   Weight loss, abnormal 04/26/2019   Malnutrition of moderate degree ([place]) 01/25/2019   Nicotine addiction 01/25/2019   Prediabetes 01/19/2019   Shortness of breath 09/21/2018   Anxiety attack 09/21/2018   Generalized weakness 09/21/2018   MCI (mild cognitive impairment) with memory loss 01/27/2017   GAD (generalized anxiety disorder) 02/13/2014   Bilateral lower abdominal pain 07/07/2013   Seasonal allergies 03/30/2013   Cervical neck pain with evidence of disc disease 11/03/2012   Hearing loss 04/13/2012   Carotid bruit 04/13/2012   Back pain with radiation 10/15/2011   Emphysema with chronic bronchitis ([place]) 01/17/2011   Arteriosclerotic cardiovascular disease (ASCVD) 09/17/2009   PERIPHERAL VASCULAR DISEASE 09/17/2009   GASTROESOPHAGEAL REFLUX DISEASE 09/17/2009   Primary hyperparathyroidism ([place]) 01/20/2009   Vitamin D deficiency 01/20/2009   Hypercalcemia 12/30/2008   Hyperlipidemia LDL goal <100 06/12/2007   Depression 06/12/2007   Essential hypertension 06/12/2007   Osteoporosis 06/12/2007    Conditions to be addressed/monitored:HTN and Dementia and Emphysema  Care Plan : RN Care Manager Plan of Care  Updates made by Kassie Mends, RN since 05/31/2021 12:00 AM     Problem: No plan of care established for management of chronic disease state  (HTN, Emphysema with chronic bronchitis, Alzheimer's dementia)   Priority: High     Long-Range Goal: Development of plan of care for chronic disease management (  HTN, Emphysema with chronic bronchitis, Alzheimer's dementia)   Start Date: 05/11/2021  Expected End Date: 11/07/2021  Priority: High  Note:   Current Barriers:  Knowledge Deficits related to plan of care for management of HTN, COPD,  and Alzheimer's Dementia  Cognitive Deficits- patient has Alzheimer's dementia with delusions, paranoia, behavioral and sleep disturbances Spoke with granddaughter [name] who assists patient when she can, patient lives with her spouse and 2 adult sons check in on her daily and provide oversight to make sure pt is taking medications, pt does not want to do what her spouse asks, she refuses to get in the car for anyone and has not been to the doctor since last March 2022, granddaughter states they cannot make the patient do anything, pt is independent with ADL's, has sleep disturbances, may stay awake for 3-4 days straight having delusions, then sleep for several days, sometimes does not eat much and other times eats adequately, pt weighs 89-91 pounds and [name] reports " she's always been small", pt smokes 2-3 packs cigarettes daily and has had no interest in quitting, family has enlisted the assistance of elder law attorney for advice and guidance and will be making decisions depending on what advice attorney gives, this attorney let [name] know this case is complex and [name] will need to get another lawyer, family does not want placement at present and declines services of LCSW, not interested in resources for dementia support group or for caregiver resources.  Social services/ APS has been involved and per their directive, granddaughter [name] is medical guardian.  Granddaughter states they have started Remeron and will be visiting tomorrow to see if there is any change with patient's behavior [name] reports pt used to be on some type of anti-anxiety medication (maybe xanax) and asks if this would also be helpful to have on hand, has not heard anything back from doctor on this and will wait and see if Remeron is helping before pursuing a different type of medication.  RNCM Clinical Goal(s):  Patient will verbalize understanding of plan for management of HTN, COPD, and Dementia as evidenced by  caregiver report, review of EHR and  through collaboration with RN Care manager, provider, and care team.   Interventions: 1:1 collaboration with primary care provider regarding development and update of comprehensive plan of care as evidenced by provider attestation and co-signature Inter-disciplinary care team collaboration (see longitudinal plan of care) Evaluation of current treatment plan related to  self management and patient's adherence to plan as established by provider   COPD (Emphysema with Chronic Bronchitis)  Interventions:  (Status:  New goal. and Goal on track:  Yes.) Long Term Goal Advised patient to self assesses COPD action plan zone and make appointment with provider if in the yellow zone for 48 hours without improvement Discussed the importance of adequate rest and management of fatigue with COPD  Alzheimer's Dementia  (Status:  New goal. and Goal on track:  Yes.)  Long Term Goal Evaluation of current treatment plan related to Anxiety and Dementia, Cognitive Deficits self-management and patient's adherence to plan as established by provider. Discussed plans with patient for ongoing care management follow up and provided patient with direct contact information for care management team Evaluation of current treatment plan related to Alzheimer's Dementia and patient's adherence to plan as established by provider Discussed plans with patient for ongoing care management follow up and provided patient with direct contact information for care management team Assisted with looking up telephone number to  Elder Sports coach in [place] for granddaughter as she plans to contact them  Hypertension Interventions:  (Status:  New goal. and Goal on track:  Yes.) Long Term Goal Last practice recorded BP readings:  BP Readings from Last 3 Encounters:  08/23/20 (!) 177/96  07/26/20 (!) 193/85  06/10/20 (!) 183/86  Most recent eGFR/CrCl:  Lab Results  Component Value Date   EGFR 68 08/10/2020     No components found for: CRCL  Reviewed medications with patient and discussed importance of compliance Advised patient, providing education and rationale, to monitor blood pressure daily and record, calling PCP for findings outside established parameters Reviewed upcoming scheduled appointments  Patient Goals/Self-Care Activities: Take medications as prescribed   Attend all scheduled provider appointments Call pharmacy for medication refills 3-7 days in advance of running out of medications Call provider office for new concerns or questions  eliminate smoking in my home eliminate symptom triggers at home follow rescue plan if symptoms flare-up get at least 7 to 8 hours of sleep at night check blood pressure 3 times per week write blood pressure results in a log or diary keep a blood pressure log take blood pressure log to all doctor appointments keep all doctor appointments take medications for blood pressure exactly as prescribed report new symptoms to your doctor Please call Dr. Moshe Cipro if you feel Remeron is not helping Contact new attorney, if you should need the help of social work in the future please let RN care manager know at [phone] Everyday care of patients with dementia: keep a routine, such as bathing dressing eating at the same time each day, plan activities that the person enjoys and try to do them at the same time each day, serve meals in a consistent, familiar place and give patient enough time to eat, encourage use of loose-fitting, comfortable, easy to use clothing such as, elastic waistbands, fabric fasteners, or large zippers pulls instead of shoelaces, buttons, or buckles.       Plan:Telephone follow up appointment with care management team member scheduled for:  07/03/21  [name] [facility], BSN RN Case Manager [facility] [phone]

## 2021-06-14 ENCOUNTER — Telehealth: Payer: Medicare HMO

## 2021-06-22 DIAGNOSIS — F02C18 Dementia in other diseases classified elsewhere, severe, with other behavioral disturbance: Secondary | ICD-10-CM | POA: Diagnosis not present

## 2021-06-22 DIAGNOSIS — J449 Chronic obstructive pulmonary disease, unspecified: Secondary | ICD-10-CM | POA: Diagnosis not present

## 2021-06-22 DIAGNOSIS — I10 Essential (primary) hypertension: Secondary | ICD-10-CM | POA: Diagnosis not present

## 2021-06-22 DIAGNOSIS — G301 Alzheimer's disease with late onset: Secondary | ICD-10-CM | POA: Diagnosis not present

## 2021-07-03 ENCOUNTER — Telehealth: Payer: Self-pay | Admitting: *Deleted

## 2021-07-03 ENCOUNTER — Telehealth: Payer: Medicare HMO

## 2021-07-03 NOTE — Telephone Encounter (Signed)
?  Care Management  ? ?Follow Up Note ? ? ?07/03/2021 ?Name: Carla [name] MRN: [id number] DOB: 1945/10/28 ? ? ?Referred by: Fayrene Helper, MD ?Reason for referral : Chronic Care Management (HTN, dementia, Emphysema) ? ? ?An unsuccessful telephone outreach was attempted today. The patient was referred to the case management team for assistance with care management and care coordination.  ? ?Follow Up Plan: Telephone follow up appointment with care management team member scheduled for: upon care guide rescheduling. ? ?[name] RNC, BSN ?RN Case Manager ?[place] ?[phone] ? ?

## 2021-07-05 ENCOUNTER — Telehealth: Payer: Self-pay | Admitting: *Deleted

## 2021-07-05 NOTE — Chronic Care Management (AMB) (Signed)
?  Care Management  ? ?Note ? ?07/05/2021 ?Name: Carla [name] MRN: [id number] DOB: 1945/04/14 ? ?Carla [name] is a 76 y.o. year old female who is a primary care patient of Fayrene Helper, MD and is actively engaged with the care management team. I reached out to Arther Abbott by phone today to assist with re-scheduling a follow up visit with the RN Case Manager ? ?Follow up plan: ?Unsuccessful telephone outreach attempt made. A HIPAA compliant phone message was left for the patient providing contact information and requesting a return call.  ? ?Pheng Prokop, CCMA ?Care Guide, Embedded Care Coordination ?[place]  Care Management  ?Direct Dial: [phone] ? ? ?

## 2021-07-19 NOTE — Chronic Care Management (AMB) (Signed)
?  Care Management  ? ?Note ? ?07/19/2021 ?Name: Carla [name] MRN: [id number] DOB: [birth year] ? ?Carla [name] is a 76 y.o. year old female who is a primary care patient of Fayrene Helper, MD and is actively engaged with the care management team. I reached out to Arther Abbott by phone today to assist with re-scheduling a follow up visit with the RN Case Manager ? ?Follow up plan: ?Telephone appointment with care management team member scheduled for: 07/27/2021 ? ?Adil Tugwell, CCMA ?Care Guide, Embedded Care Coordination ?[place]  Care Management  ?Direct Dial: [phone] ? ? ?

## 2021-07-27 ENCOUNTER — Ambulatory Visit (INDEPENDENT_AMBULATORY_CARE_PROVIDER_SITE_OTHER): Payer: Medicare HMO | Admitting: *Deleted

## 2021-07-27 DIAGNOSIS — I10 Essential (primary) hypertension: Secondary | ICD-10-CM

## 2021-07-27 DIAGNOSIS — G301 Alzheimer's disease with late onset: Secondary | ICD-10-CM

## 2021-07-27 NOTE — Chronic Care Management (AMB) (Signed)
?Chronic Care Management  ? ?CCM RN Visit Note ? ?07/27/2021 ?Name: Carla [name] MRN: [id number] DOB: June 19, 1945 ? ?Subjective: ?Carla [name] is a 76 y.o. year old female who is a primary care patient of Moshe Cipro Norwood Levo, MD. The care management team was consulted for assistance with disease management and care coordination needs.   ? ?Engaged with patient by telephone for follow up visit in response to provider referral for case management and/or care coordination services.  ? ?Consent to Services:  ?The patient was given information about Chronic Care Management services, agreed to services, and gave verbal consent prior to initiation of services.  Please see initial visit note for detailed documentation.  ? ?Patient agreed to services and verbal consent obtained.  ? ?Assessment: Review of patient past medical history, allergies, medications, health status, including review of consultants reports, laboratory and other test data, was performed as part of comprehensive evaluation and provision of chronic care management services.  ? ?SDOH (Social Determinants of Health) assessments and interventions performed:   ? ?CCM Care Plan ? ?Allergies  ?Allergen Reactions  ? Aricept [Donepezil Hcl]   ?  diarrhea  ? Calcium-Containing Compounds Other (See Comments)  ?  Pt has hyperparathyroidism which results in hypercalcemia  ? Exelon [Rivastigmine Tartrate] Rash  ? ? ?Outpatient Encounter Medications as of 07/27/2021  ?Medication Sig  ? amLODipine (NORVASC) 10 MG tablet TAKE ONE TABLET BY MOUTH ONCE DAILY.  ? gabapentin (NEURONTIN) 100 MG capsule TAKE (2) CAPSULES BY MOUTH AT BEDTIME.  ? mirtazapine (REMERON) 15 MG tablet Take 1 tablet (15 mg total) by mouth at bedtime.  ? aspirin EC 81 MG tablet Take 81 mg by mouth at bedtime.  ? Multiple Vitamin (MULTIVITAMIN WITH MINERALS) TABS Take 1 tablet by mouth every evening.  ? ?No facility-administered encounter medications on file as of 07/27/2021.  ? ? ?Patient Active  Problem List  ? Diagnosis Date Noted  ? Dementia ([place]) 07/28/2020  ? Pain in both lower extremities 08/11/2019  ? Elevated hemoglobin ([place]) 08/11/2019  ? Memory loss 08/11/2019  ? Anorexia 08/11/2019  ? Serrated adenoma of colon 05/19/2019  ? Weight loss, abnormal 04/26/2019  ? Malnutrition of moderate degree ([place]) 01/25/2019  ? Nicotine addiction 01/25/2019  ? Prediabetes 01/19/2019  ? Shortness of breath 09/21/2018  ? Anxiety attack 09/21/2018  ? Generalized weakness 09/21/2018  ? MCI (mild cognitive impairment) with memory loss 01/27/2017  ? GAD (generalized anxiety disorder) 02/13/2014  ? Bilateral lower abdominal pain 07/07/2013  ? Seasonal allergies 03/30/2013  ? Cervical neck pain with evidence of disc disease 11/03/2012  ? Hearing loss 04/13/2012  ? Carotid bruit 04/13/2012  ? Back pain with radiation 10/15/2011  ? Emphysema with chronic bronchitis ([place]) 01/17/2011  ? Arteriosclerotic cardiovascular disease (ASCVD) 09/17/2009  ? PERIPHERAL VASCULAR DISEASE 09/17/2009  ? GASTROESOPHAGEAL REFLUX DISEASE 09/17/2009  ? Primary hyperparathyroidism ([place]) 01/20/2009  ? Vitamin D deficiency 01/20/2009  ? Hypercalcemia 12/30/2008  ? Hyperlipidemia LDL goal <100 06/12/2007  ? Depression 06/12/2007  ? Essential hypertension 06/12/2007  ? Osteoporosis 06/12/2007  ? ? ?Conditions to be addressed/monitored:HTN and Dementia ? ?Care Plan : RN Care Manager Plan of Care  ?Updates made by Kassie Mends, RN since 07/27/2021 12:00 AM  ?  ? ?Problem: No plan of care established for management of chronic disease state  (HTN, Emphysema with chronic bronchitis, Alzheimer's dementia)   ?Priority: High  ?  ? ?Long-Range Goal: Development of plan of care for chronic disease management (HTN, Emphysema  with chronic bronchitis, Alzheimer's dementia)   ?Start Date: 05/11/2021  ?Expected End Date: 11/07/2021  ?Priority: High  ?Note:   ?Current Barriers:  ?Knowledge Deficits related to plan of care for management of HTN, COPD, and Alzheimer's  Dementia  ?Cognitive Deficits- patient has Alzheimer's dementia with delusions, paranoia, behavioral and sleep disturbances ?Spoke with granddaughter Carla [name] who assists patient when she can, patient lives with her spouse and 2 adult sons check in on her daily and provide oversight to make sure pt is taking medications, pt does not want to do what her spouse asks, she refuses to get in the car for anyone and has not been to the doctor since last March 2022, granddaughter states they cannot make the patient do anything, pt is independent with ADL's, has sleep disturbances, may stay awake for 3-4 days straight having delusions, then sleep for several days, sometimes does not eat much and other times eats adequately, pt weighs 89-91 pounds and Carla [name] reports " she's always been small", pt smokes 2-3 packs cigarettes daily and has had no interest in quitting, family has enlisted the assistance of elder law attorney for advice and guidance and will be making decisions depending on what advice attorney gives, this attorney let Carla [name] know this case is complex and Carla [name] will need to get another lawyer, family does not want placement at present and declines services of LCSW, not interested in resources for dementia support group or for caregiver resources.  Social services/ APS has been involved and per their directive, granddaughter Carla [name] is medical guardian.  Granddaughter states patient is doing "very well on remeron", seems to be helping manage symptoms/ behavioral disturbances, pt is now agreeable to get in the car to go to appointments, etc.  Patient continues to smoke, not interested in quitting, blood pressure " has been good" ? ?RNCM Clinical Goal(s):  ?Patient will verbalize understanding of plan for management of HTN, COPD, and Dementia as evidenced by caregiver report, review of EHR and  through collaboration with RN Care manager, provider, and care team.  ? ?Interventions: ?1:1 collaboration with  primary care provider regarding development and update of comprehensive plan of care as evidenced by provider attestation and co-signature ?Inter-disciplinary care team collaboration (see longitudinal plan of care) ?Evaluation of current treatment plan related to  self management and patient's adherence to plan as established by provider ? ? ?COPD (Emphysema with Chronic Bronchitis)  Interventions:  (Status:  New goal. and Goal on track:  Yes.) Long Term Goal ?Advised patient to self assesses COPD action plan zone and make appointment with provider if in the yellow zone for 48 hours without improvement ?Discussed the importance of adequate rest and management of fatigue with COPD ?Reviewed importance of smoking cessation ? ?Alzheimer's Dementia  (Status:  New goal. and Goal on track:  Yes.)  Long Term Goal ?Evaluation of current treatment plan related to Anxiety and Dementia, Cognitive Deficits self-management and patient's adherence to plan as established by provider. ?Discussed plans with patient for ongoing care management follow up and provided patient with direct contact information for care management team ?Evaluation of current treatment plan related to Alzheimer's Dementia and patient's adherence to plan as established by provider ? ? ?Hypertension Interventions:  (Status:  New goal. and Goal on track:  Yes.) Long Term Goal ?Last practice recorded BP readings:  ?BP Readings from Last 3 Encounters:  ?08/23/20 (!) 177/96  ?07/26/20 (!) 193/85  ?06/10/20 (!) 183/86  ?Most recent eGFR/CrCl:  ?Lab Results  ?Component  Value Date  ? EGFR 68 08/10/2020  ?  No components found for: CRCL ? ?Reviewed medications with patient and discussed importance of compliance ?Advised patient, providing education and rationale, to monitor blood pressure daily and record, calling PCP for findings outside established parameters ?Reviewed upcoming scheduled appointments ?Reviewed importance of low sodium diet ? ?Patient  Goals/Self-Care Activities: ?Take medications as prescribed   ?Attend all scheduled provider appointments ?Call pharmacy for medication refills 3-7 days in advance of running out of medications ?Call provider office for new c

## 2021-07-27 NOTE — Patient Instructions (Signed)
Visit Information ? ?Thank you for taking time to visit with me today. Please don't hesitate to contact me if I can be of assistance to you before our next scheduled telephone appointment. ? ?Following are the goals we discussed today:  ?Take medications as prescribed   ?Attend all scheduled provider appointments ?Call pharmacy for medication refills 3-7 days in advance of running out of medications ?Call provider office for new concerns or questions  ?eliminate smoking in my home ?listen for public air quality announcements every day ?do breathing exercises every day ?follow rescue plan if symptoms flare-up ?get at least 7 to 8 hours of sleep at night ?check blood pressure 3 times per week ?write blood pressure results in a log or diary ?keep a blood pressure log ?take blood pressure log to all doctor appointments ?keep all doctor appointments ?take medications for blood pressure exactly as prescribed ?report new symptoms to your doctor ?eat more whole grains, fruits and vegetables, lean meats and healthy fats ?Consider smoking cessation ?If you should need the help of social work in the future please let RN care manager know at [phone] ?Everyday care of patients with dementia: keep a routine, such as bathing dressing eating at the same time each day, plan activities that the person enjoys and try to do them at the same time each day, serve meals in a consistent, familiar place and give patient enough time to eat, encourage use of loose-fitting, comfortable, easy to use clothing such as, elastic waistbands, fabric fasteners, or large zippers pulls instead of shoelaces, buttons, or buckles. ? ?Our next appointment is by telephone on 10/19/21 at 9 am ? ?Please call the care guide team at [phone] if you need to cancel or reschedule your appointment.  ? ?If you are experiencing a Mental Health or [place] or need someone to talk to, please call the Suicide and Crisis Lifeline: 988 ?call the [place]  National Suicide Prevention Lifeline: 4128665498 or TTY: 806-127-6590 TTY (762) 655-3441) to talk to a trained counselor ?call 1-800-273-TALK (toll free, 24 hour hotline) ?go to [facility] [address], [place] [phone]) ?call 911  ? ?Patient verbalizes understanding of instructions and care plan provided today and agrees to view in [place]. Active MyChart status confirmed with patient.   ? ?[name] RNC, BSN ?RN Case Manager ?[place] ?[phone] ? ?

## 2021-07-30 ENCOUNTER — Other Ambulatory Visit: Payer: Self-pay | Admitting: Family Medicine

## 2021-07-31 ENCOUNTER — Other Ambulatory Visit: Payer: Self-pay | Admitting: Family Medicine

## 2021-08-10 ENCOUNTER — Ambulatory Visit: Payer: Medicare HMO | Admitting: Family Medicine

## 2021-08-10 ENCOUNTER — Ambulatory Visit (INDEPENDENT_AMBULATORY_CARE_PROVIDER_SITE_OTHER): Payer: Medicare HMO | Admitting: Family Medicine

## 2021-08-10 DIAGNOSIS — G301 Alzheimer's disease with late onset: Secondary | ICD-10-CM

## 2021-08-10 DIAGNOSIS — F17218 Nicotine dependence, cigarettes, with other nicotine-induced disorders: Secondary | ICD-10-CM

## 2021-08-10 DIAGNOSIS — F02C18 Dementia in other diseases classified elsewhere, severe, with other behavioral disturbance: Secondary | ICD-10-CM

## 2021-08-10 DIAGNOSIS — I10 Essential (primary) hypertension: Secondary | ICD-10-CM | POA: Diagnosis not present

## 2021-08-10 NOTE — Progress Notes (Signed)
Virtual Visit via Telephone Note  I connected with [name] , grand daughter of dovey fatzinger on 08/10/21 at 4:05 pm by telephone and verified that I am speaking with the correct person using two identifiers.  Location: Patient:  home Provider: offioe   I discussed the limitations, risks, security and privacy concerns of performing an evaluation and management service by telephone and the availability of in person appointments. I also discussed with the patient that there may be a patient responsible charge related to this service. The patient expressed understanding and agreed to proceed.   History of Present Illness:   Grand daughter reports improved sleep and less agitation on medication recently started at last visit. Eating is as she feels like, not much difference observed Concerned that it is impossible to get Ms Keelin out of her home [place] daughter voices no concerns of fevr,cough, difficulty with bowel  movements or malodorous urine Still smokes and will not quit Observations/Objective: There were no vitals taken for this visit.   Assessment and Plan:  Essential hypertension DASH diet and commitment to daily physical activity for a minimum of 30 minutes discussed and encouraged, as a part of hypertension management. The importance of attaining a healthy weight is also discussed.     08/23/2020   11:25 AM 07/26/2020   10:37 AM 06/10/2020    5:59 PM 06/10/2020    5:30 PM 06/10/2020    4:15 PM 06/10/2020    4:13 PM 06/10/2020    4:12 PM  BP/Weight  Systolic BP 491 791 505 697 948  016  Diastolic BP 96 85 86 86 82  94  Wt. (Lbs) 91 89    120   BMI 16.64 kg/m2 16.28 kg/m2    21.95 kg/m2      needin office  eval  Dementia (HCC) Agitation reported improved as well as sleep, conitnue current medication Appetite still poor and increased difficulty getting pt out of the home  Nicotine addiction Still smoking and  Not committing to quitting per grand daughter, I encouraged  her to work with Ms Vangorden re cutting back  Follow Up Instructions:    I discussed the assessment and treatment plan with the patient. The patient was provided an opportunity to ask questions and all were answered. The patient agreed with the plan and demonstrated an understanding of the instructions.   The patient was advised to call back or seek an in-person evaluation if the symptoms worsen or if the condition fails to improve as anticipated.  I provided 13 minutes of non-face-to-face time during this encounter.   Tula Nakayama, MD

## 2021-08-10 NOTE — Patient Instructions (Addendum)
Nurse visit for weight  check next week same day as she comes for blood work, next Tuesday through Friday  In office visit with MD early Septemeber, call if you need me sooner.  Fasting cBC, lipid, cmp and EGFR, tSH, hBA1C and Vit d next week Tuesday through Friday   Please work on cutting back on cigarettes  Thanks for choosing [facility], we consider it a privelige to serve you.

## 2021-08-12 ENCOUNTER — Encounter: Payer: Self-pay | Admitting: Family Medicine

## 2021-08-12 NOTE — Assessment & Plan Note (Signed)
Agitation reported improved as well as sleep, conitnue current medication Appetite still poor and increased difficulty getting pt out of the home

## 2021-08-12 NOTE — Assessment & Plan Note (Signed)
DASH diet and commitment to daily physical activity for a minimum of 30 minutes discussed and encouraged, as a part of hypertension management. The importance of attaining a healthy weight is also discussed.     08/23/2020   11:25 AM 07/26/2020   10:37 AM 06/10/2020    5:59 PM 06/10/2020    5:30 PM 06/10/2020    4:15 PM 06/10/2020    4:13 PM 06/10/2020    4:12 PM  BP/Weight  Systolic BP 387 564 332 951 884  166  Diastolic BP 96 85 86 86 82  94  Wt. (Lbs) 91 89    120   BMI 16.64 kg/m2 16.28 kg/m2    21.95 kg/m2      needin office  eval

## 2021-08-12 NOTE — Assessment & Plan Note (Signed)
Still smoking and  Not committing to quitting per grand daughter, I encouraged her to work with Ms Hertzog re cutting back

## 2021-08-22 DIAGNOSIS — G309 Alzheimer's disease, unspecified: Secondary | ICD-10-CM | POA: Diagnosis not present

## 2021-08-22 DIAGNOSIS — J439 Emphysema, unspecified: Secondary | ICD-10-CM | POA: Diagnosis not present

## 2021-08-22 DIAGNOSIS — F0280 Dementia in other diseases classified elsewhere without behavioral disturbance: Secondary | ICD-10-CM

## 2021-08-22 DIAGNOSIS — F17210 Nicotine dependence, cigarettes, uncomplicated: Secondary | ICD-10-CM

## 2021-08-22 DIAGNOSIS — J440 Chronic obstructive pulmonary disease with acute lower respiratory infection: Secondary | ICD-10-CM

## 2021-08-22 DIAGNOSIS — I10 Essential (primary) hypertension: Secondary | ICD-10-CM | POA: Diagnosis not present

## 2021-09-20 ENCOUNTER — Other Ambulatory Visit: Payer: Self-pay | Admitting: Family Medicine

## 2021-09-20 DIAGNOSIS — I10 Essential (primary) hypertension: Secondary | ICD-10-CM

## 2021-10-08 ENCOUNTER — Telehealth: Payer: Self-pay | Admitting: *Deleted

## 2021-10-08 NOTE — Chronic Care Management (AMB) (Signed)
  Chronic Care Management Note  10/08/2021 Name: [name] MRN: [id number] DOB: 1945/04/11  Carla [name] is a 76 y.o. year old female who is a primary care patient of Fayrene Helper, MD and is actively engaged with the care management team. I reached out to Arther Abbott by phone today to assist with re-scheduling a follow up visit with the RN Case Manager  Follow up plan: Unsuccessful telephone outreach attempt made. A HIPAA compliant phone message was left for the patient providing contact information and requesting a return call.  The care management team will reach out to the patient again over the next 7 days.  If patient returns call to provider office, please advise to call [place] at [phone].  [place]  Direct Dial: [phone]

## 2021-10-09 NOTE — Chronic Care Management (AMB) (Signed)
  Chronic Care Management Note  10/09/2021 Name: Carla [name] MRN: [id number] DOB: [birth year]  Carla [name] is a 76 y.o. year old female who is a primary care patient of Fayrene Helper, MD and is actively engaged with the care management team. I reached out to Arther Abbott by phone today to assist with re-scheduling a follow up visit with the RN Case Manager  Follow up plan: Unsuccessful telephone outreach attempt made. A HIPAA compliant phone message was left for the patient providing contact information and requesting a return call.  The care management team will reach out to the patient again over the next 7 days.  If patient returns call to provider office, please advise to call [place] at [phone].  [place]  Direct Dial: [phone]

## 2021-10-15 NOTE — Progress Notes (Signed)
This encounter was created in error - please disregard.

## 2021-10-15 NOTE — Chronic Care Management (AMB) (Signed)
  Chronic Care Management Note  10/15/2021 Name: ISRAELLA HUBERT MRN: [id number] DOB: 24-May-1945  MARGERT EDSALL is a 76 y.o. year old female who is a primary care patient of Fayrene Helper, MD and is actively engaged with the care management team. I reached out to Arther Abbott by phone today to assist with re-scheduling a follow up visit with the RN Case Manager  Follow up plan: A third unsuccessful telephone outreach attempt made. A HIPAA compliant phone message was left for the patient providing contact information and requesting a return call. Unable to make contact on outreach attempts x 3. PCP Fayrene Helper, MD notified via routed documentation in medical record. We have been unable to make contact with the patient for follow up. The care management team is available to follow up with the patient after provider conversation with the patient regarding recommendation for care management engagement and subsequent re-referral to the care management team.   [place]  Direct Dial: [phone]

## 2021-10-17 ENCOUNTER — Ambulatory Visit: Payer: Self-pay | Admitting: *Deleted

## 2021-10-17 DIAGNOSIS — I10 Essential (primary) hypertension: Secondary | ICD-10-CM

## 2021-10-17 DIAGNOSIS — G301 Alzheimer's disease with late onset: Secondary | ICD-10-CM

## 2021-10-17 DIAGNOSIS — J449 Chronic obstructive pulmonary disease, unspecified: Secondary | ICD-10-CM

## 2021-10-17 NOTE — Chronic Care Management (AMB) (Signed)
   10/17/2021  Carla [name] November 24, 1945 [id number]   Per message from care guide on 10/15/21, unable to reach x 3 for reschedule, unable to maintain contact, case closed.  [name] [facility], BSN RN Case Manager [facility] [phone]

## 2021-10-19 ENCOUNTER — Telehealth: Payer: Medicare HMO

## 2021-11-19 ENCOUNTER — Ambulatory Visit: Payer: Medicare HMO

## 2021-11-20 ENCOUNTER — Encounter: Payer: Self-pay | Admitting: Family Medicine

## 2022-03-19 ENCOUNTER — Other Ambulatory Visit: Payer: Self-pay | Admitting: Family Medicine

## 2022-06-12 ENCOUNTER — Emergency Department (HOSPITAL_COMMUNITY): Payer: HMO

## 2022-06-12 ENCOUNTER — Other Ambulatory Visit (HOSPITAL_COMMUNITY): Payer: Self-pay

## 2022-06-12 ENCOUNTER — Inpatient Hospital Stay (HOSPITAL_COMMUNITY)
Admission: EM | Admit: 2022-06-12 | Discharge: 2022-08-29 | DRG: 004 | Disposition: A | Discharge status: Home Health | Payer: HMO | Attending: Family Medicine | Admitting: Family Medicine

## 2022-06-12 ENCOUNTER — Other Ambulatory Visit: Payer: Self-pay

## 2022-06-12 DIAGNOSIS — F05 Delirium due to known physiological condition: Secondary | ICD-10-CM | POA: Diagnosis not present

## 2022-06-12 DIAGNOSIS — W19XXXA Unspecified fall, initial encounter: Secondary | ICD-10-CM | POA: Diagnosis not present

## 2022-06-12 DIAGNOSIS — J0410 Acute tracheitis without obstruction: Secondary | ICD-10-CM | POA: Diagnosis not present

## 2022-06-12 DIAGNOSIS — G9341 Metabolic encephalopathy: Secondary | ICD-10-CM | POA: Diagnosis not present

## 2022-06-12 DIAGNOSIS — T85598A Other mechanical complication of other gastrointestinal prosthetic devices, implants and grafts, initial encounter: Secondary | ICD-10-CM | POA: Diagnosis not present

## 2022-06-12 DIAGNOSIS — F0394 Unspecified dementia, unspecified severity, with anxiety: Secondary | ICD-10-CM | POA: Diagnosis present

## 2022-06-12 DIAGNOSIS — I10 Essential (primary) hypertension: Secondary | ICD-10-CM | POA: Diagnosis not present

## 2022-06-12 DIAGNOSIS — E873 Alkalosis: Secondary | ICD-10-CM | POA: Diagnosis not present

## 2022-06-12 DIAGNOSIS — J9601 Acute respiratory failure with hypoxia: Principal | ICD-10-CM | POA: Diagnosis present

## 2022-06-12 DIAGNOSIS — E559 Vitamin D deficiency, unspecified: Secondary | ICD-10-CM | POA: Diagnosis present

## 2022-06-12 DIAGNOSIS — J439 Emphysema, unspecified: Secondary | ICD-10-CM | POA: Diagnosis present

## 2022-06-12 DIAGNOSIS — L89892 Pressure ulcer of other site, stage 2: Secondary | ICD-10-CM | POA: Diagnosis not present

## 2022-06-12 DIAGNOSIS — R739 Hyperglycemia, unspecified: Secondary | ICD-10-CM | POA: Diagnosis not present

## 2022-06-12 DIAGNOSIS — M810 Age-related osteoporosis without current pathological fracture: Secondary | ICD-10-CM | POA: Diagnosis present

## 2022-06-12 DIAGNOSIS — Y731 Therapeutic (nonsurgical) and rehabilitative gastroenterology and urology devices associated with adverse incidents: Secondary | ICD-10-CM | POA: Diagnosis not present

## 2022-06-12 DIAGNOSIS — E1151 Type 2 diabetes mellitus with diabetic peripheral angiopathy without gangrene: Secondary | ICD-10-CM | POA: Diagnosis present

## 2022-06-12 DIAGNOSIS — R55 Syncope and collapse: Secondary | ICD-10-CM | POA: Diagnosis not present

## 2022-06-12 DIAGNOSIS — Y92239 Unspecified place in hospital as the place of occurrence of the external cause: Secondary | ICD-10-CM | POA: Diagnosis not present

## 2022-06-12 DIAGNOSIS — J14 Pneumonia due to Hemophilus influenzae: Secondary | ICD-10-CM | POA: Diagnosis not present

## 2022-06-12 DIAGNOSIS — R918 Other nonspecific abnormal finding of lung field: Secondary | ICD-10-CM | POA: Diagnosis present

## 2022-06-12 DIAGNOSIS — F0393 Unspecified dementia, unspecified severity, with mood disturbance: Secondary | ICD-10-CM | POA: Diagnosis present

## 2022-06-12 DIAGNOSIS — R4182 Altered mental status, unspecified: Secondary | ICD-10-CM | POA: Diagnosis not present

## 2022-06-12 DIAGNOSIS — F0390 Unspecified dementia without behavioral disturbance: Secondary | ICD-10-CM | POA: Diagnosis not present

## 2022-06-12 DIAGNOSIS — Z66 Do not resuscitate: Secondary | ICD-10-CM | POA: Diagnosis not present

## 2022-06-12 DIAGNOSIS — R296 Repeated falls: Secondary | ICD-10-CM | POA: Diagnosis not present

## 2022-06-12 DIAGNOSIS — F32A Depression, unspecified: Secondary | ICD-10-CM | POA: Diagnosis not present

## 2022-06-12 DIAGNOSIS — R402431 Glasgow coma scale score 3-8, in the field [EMT or ambulance]: Secondary | ICD-10-CM

## 2022-06-12 DIAGNOSIS — I739 Peripheral vascular disease, unspecified: Secondary | ICD-10-CM | POA: Diagnosis not present

## 2022-06-12 DIAGNOSIS — E870 Hyperosmolality and hypernatremia: Secondary | ICD-10-CM | POA: Diagnosis not present

## 2022-06-12 DIAGNOSIS — A413 Sepsis due to Hemophilus influenzae: Secondary | ICD-10-CM | POA: Diagnosis not present

## 2022-06-12 DIAGNOSIS — T17890A Other foreign object in other parts of respiratory tract causing asphyxiation, initial encounter: Secondary | ICD-10-CM | POA: Diagnosis not present

## 2022-06-12 DIAGNOSIS — E11649 Type 2 diabetes mellitus with hypoglycemia without coma: Secondary | ICD-10-CM | POA: Diagnosis not present

## 2022-06-12 DIAGNOSIS — F419 Anxiety disorder, unspecified: Secondary | ICD-10-CM | POA: Diagnosis present

## 2022-06-12 DIAGNOSIS — J9811 Atelectasis: Secondary | ICD-10-CM | POA: Diagnosis not present

## 2022-06-12 DIAGNOSIS — J811 Chronic pulmonary edema: Secondary | ICD-10-CM | POA: Diagnosis not present

## 2022-06-12 DIAGNOSIS — T886XXA Anaphylactic reaction due to adverse effect of correct drug or medicament properly administered, initial encounter: Secondary | ICD-10-CM | POA: Diagnosis not present

## 2022-06-12 DIAGNOSIS — J449 Chronic obstructive pulmonary disease, unspecified: Secondary | ICD-10-CM | POA: Diagnosis not present

## 2022-06-12 DIAGNOSIS — T17490A Other foreign object in trachea causing asphyxiation, initial encounter: Secondary | ICD-10-CM | POA: Diagnosis not present

## 2022-06-12 DIAGNOSIS — E43 Unspecified severe protein-calorie malnutrition: Secondary | ICD-10-CM | POA: Diagnosis not present

## 2022-06-12 DIAGNOSIS — T502X5A Adverse effect of carbonic-anhydrase inhibitors, benzothiadiazides and other diuretics, initial encounter: Secondary | ICD-10-CM | POA: Diagnosis not present

## 2022-06-12 DIAGNOSIS — R81 Glycosuria: Secondary | ICD-10-CM | POA: Diagnosis present

## 2022-06-12 DIAGNOSIS — L89151 Pressure ulcer of sacral region, stage 1: Secondary | ICD-10-CM | POA: Diagnosis not present

## 2022-06-12 DIAGNOSIS — Z7189 Other specified counseling: Secondary | ICD-10-CM | POA: Diagnosis not present

## 2022-06-12 DIAGNOSIS — N179 Acute kidney failure, unspecified: Secondary | ICD-10-CM | POA: Diagnosis not present

## 2022-06-12 DIAGNOSIS — I2510 Atherosclerotic heart disease of native coronary artery without angina pectoris: Secondary | ICD-10-CM | POA: Diagnosis present

## 2022-06-12 DIAGNOSIS — R7989 Other specified abnormal findings of blood chemistry: Secondary | ICD-10-CM | POA: Diagnosis present

## 2022-06-12 DIAGNOSIS — R0602 Shortness of breath: Secondary | ICD-10-CM | POA: Diagnosis not present

## 2022-06-12 DIAGNOSIS — I351 Nonrheumatic aortic (valve) insufficiency: Secondary | ICD-10-CM | POA: Diagnosis not present

## 2022-06-12 DIAGNOSIS — J189 Pneumonia, unspecified organism: Secondary | ICD-10-CM | POA: Diagnosis not present

## 2022-06-12 DIAGNOSIS — I2489 Other forms of acute ischemic heart disease: Secondary | ICD-10-CM | POA: Diagnosis not present

## 2022-06-12 DIAGNOSIS — J9612 Chronic respiratory failure with hypercapnia: Secondary | ICD-10-CM | POA: Diagnosis present

## 2022-06-12 DIAGNOSIS — Z515 Encounter for palliative care: Secondary | ICD-10-CM | POA: Diagnosis not present

## 2022-06-12 DIAGNOSIS — Z1152 Encounter for screening for COVID-19: Secondary | ICD-10-CM | POA: Diagnosis not present

## 2022-06-12 DIAGNOSIS — E871 Hypo-osmolality and hyponatremia: Secondary | ICD-10-CM | POA: Diagnosis not present

## 2022-06-12 DIAGNOSIS — S0990XA Unspecified injury of head, initial encounter: Secondary | ICD-10-CM | POA: Diagnosis not present

## 2022-06-12 DIAGNOSIS — I5032 Chronic diastolic (congestive) heart failure: Secondary | ICD-10-CM | POA: Diagnosis present

## 2022-06-12 DIAGNOSIS — F17210 Nicotine dependence, cigarettes, uncomplicated: Secondary | ICD-10-CM | POA: Diagnosis present

## 2022-06-12 DIAGNOSIS — T859XXA Unspecified complication of internal prosthetic device, implant and graft, initial encounter: Secondary | ICD-10-CM | POA: Diagnosis not present

## 2022-06-12 DIAGNOSIS — S0003XA Contusion of scalp, initial encounter: Secondary | ICD-10-CM | POA: Diagnosis not present

## 2022-06-12 DIAGNOSIS — R000 Tachycardia, unspecified: Secondary | ICD-10-CM | POA: Diagnosis not present

## 2022-06-12 DIAGNOSIS — T4275XA Adverse effect of unspecified antiepileptic and sedative-hypnotic drugs, initial encounter: Secondary | ICD-10-CM | POA: Diagnosis not present

## 2022-06-12 DIAGNOSIS — Z888 Allergy status to other drugs, medicaments and biological substances status: Secondary | ICD-10-CM

## 2022-06-12 DIAGNOSIS — J441 Chronic obstructive pulmonary disease with (acute) exacerbation: Secondary | ICD-10-CM | POA: Diagnosis present

## 2022-06-12 DIAGNOSIS — K5900 Constipation, unspecified: Secondary | ICD-10-CM | POA: Diagnosis not present

## 2022-06-12 DIAGNOSIS — J9621 Acute and chronic respiratory failure with hypoxia: Secondary | ICD-10-CM | POA: Diagnosis not present

## 2022-06-12 DIAGNOSIS — Z781 Physical restraint status: Secondary | ICD-10-CM

## 2022-06-12 DIAGNOSIS — I959 Hypotension, unspecified: Secondary | ICD-10-CM | POA: Diagnosis not present

## 2022-06-12 DIAGNOSIS — E1165 Type 2 diabetes mellitus with hyperglycemia: Secondary | ICD-10-CM | POA: Diagnosis present

## 2022-06-12 DIAGNOSIS — R638 Other symptoms and signs concerning food and fluid intake: Secondary | ICD-10-CM | POA: Diagnosis not present

## 2022-06-12 DIAGNOSIS — J9602 Acute respiratory failure with hypercapnia: Secondary | ICD-10-CM | POA: Diagnosis present

## 2022-06-12 DIAGNOSIS — R578 Other shock: Secondary | ICD-10-CM | POA: Diagnosis not present

## 2022-06-12 DIAGNOSIS — R339 Retention of urine, unspecified: Secondary | ICD-10-CM | POA: Diagnosis not present

## 2022-06-12 DIAGNOSIS — I110 Hypertensive heart disease with heart failure: Secondary | ICD-10-CM | POA: Diagnosis present

## 2022-06-12 DIAGNOSIS — Z681 Body mass index (BMI) 19 or less, adult: Secondary | ICD-10-CM

## 2022-06-12 DIAGNOSIS — G9340 Encephalopathy, unspecified: Secondary | ICD-10-CM | POA: Diagnosis not present

## 2022-06-12 DIAGNOSIS — F03918 Unspecified dementia, unspecified severity, with other behavioral disturbance: Secondary | ICD-10-CM | POA: Diagnosis not present

## 2022-06-12 DIAGNOSIS — B963 Hemophilus influenzae [H. influenzae] as the cause of diseases classified elsewhere: Secondary | ICD-10-CM | POA: Diagnosis present

## 2022-06-12 DIAGNOSIS — R7881 Bacteremia: Secondary | ICD-10-CM | POA: Diagnosis not present

## 2022-06-12 DIAGNOSIS — Z4682 Encounter for fitting and adjustment of non-vascular catheter: Secondary | ICD-10-CM | POA: Diagnosis not present

## 2022-06-12 DIAGNOSIS — J9622 Acute and chronic respiratory failure with hypercapnia: Secondary | ICD-10-CM | POA: Diagnosis not present

## 2022-06-12 DIAGNOSIS — T50916A Underdosing of multiple unspecified drugs, medicaments and biological substances, initial encounter: Secondary | ICD-10-CM | POA: Diagnosis present

## 2022-06-12 DIAGNOSIS — R6521 Severe sepsis with septic shock: Secondary | ICD-10-CM | POA: Diagnosis not present

## 2022-06-12 DIAGNOSIS — Z720 Tobacco use: Secondary | ICD-10-CM

## 2022-06-12 DIAGNOSIS — Z930 Tracheostomy status: Secondary | ICD-10-CM | POA: Diagnosis not present

## 2022-06-12 DIAGNOSIS — Z91138 Patient's unintentional underdosing of medication regimen for other reason: Secondary | ICD-10-CM

## 2022-06-12 DIAGNOSIS — R0902 Hypoxemia: Secondary | ICD-10-CM | POA: Diagnosis not present

## 2022-06-12 DIAGNOSIS — R0682 Tachypnea, not elsewhere classified: Secondary | ICD-10-CM | POA: Diagnosis not present

## 2022-06-12 DIAGNOSIS — J9690 Respiratory failure, unspecified, unspecified whether with hypoxia or hypercapnia: Secondary | ICD-10-CM | POA: Diagnosis not present

## 2022-06-12 DIAGNOSIS — Z4659 Encounter for fitting and adjustment of other gastrointestinal appliance and device: Secondary | ICD-10-CM | POA: Diagnosis not present

## 2022-06-12 LAB — CBC WITH DIFFERENTIAL/PLATELET
Abs Immature Granulocytes: 0.1 10*3/uL — ABNORMAL HIGH (ref 0.00–0.07)
Band Neutrophils: 40 %
Basophils Absolute: 0 10*3/uL (ref 0.0–0.1)
Basophils Relative: 0 %
Eosinophils Absolute: 0 10*3/uL (ref 0.0–0.5)
Eosinophils Relative: 0 %
HCT: 51.5 % — ABNORMAL HIGH (ref 36.0–46.0)
Hemoglobin: 16.3 g/dL — ABNORMAL HIGH (ref 12.0–15.0)
Lymphocytes Relative: 17 %
Lymphs Abs: 1.2 10*3/uL (ref 0.7–4.0)
MCH: 31.4 pg (ref 26.0–34.0)
MCHC: 31.7 g/dL (ref 30.0–36.0)
MCV: 99.2 fL (ref 80.0–100.0)
Metamyelocytes Relative: 2 %
Monocytes Absolute: 0.9 10*3/uL (ref 0.1–1.0)
Monocytes Relative: 13 %
Neutro Abs: 4.7 10*3/uL (ref 1.7–7.7)
Neutrophils Relative %: 28 %
Platelets: 344 10*3/uL (ref 150–400)
RBC: 5.19 MIL/uL — ABNORMAL HIGH (ref 3.87–5.11)
RDW: 13.2 % (ref 11.5–15.5)
WBC Morphology: INCREASED
WBC: 6.9 10*3/uL (ref 4.0–10.5)
nRBC: 0 % (ref 0.0–0.2)

## 2022-06-12 LAB — BLOOD GAS, VENOUS
Acid-base deficit: 2.4 mmol/L — ABNORMAL HIGH (ref 0.0–2.0)
Bicarbonate: 32.3 mmol/L — ABNORMAL HIGH (ref 20.0–28.0)
Drawn by: 66297
O2 Saturation: 45.3 %
Patient temperature: 36.3
pCO2, Ven: 106 mmHg (ref 44–60)
pH, Ven: 7.09 — CL (ref 7.25–7.43)
pO2, Ven: 33 mmHg (ref 32–45)

## 2022-06-12 LAB — BLOOD GAS, ARTERIAL
Acid-Base Excess: 1.5 mmol/L (ref 0.0–2.0)
Acid-Base Excess: 4.2 mmol/L — ABNORMAL HIGH (ref 0.0–2.0)
Bicarbonate: 34.1 mmol/L — ABNORMAL HIGH (ref 20.0–28.0)
Bicarbonate: 34 mmol/L — ABNORMAL HIGH (ref 20.0–28.0)
Drawn by: 23430
Drawn by: 21310
O2 Saturation: 100 %
O2 Saturation: 97.1 %
Patient temperature: 36.5
Patient temperature: 36.3
pCO2 arterial: 96 mmHg (ref 32–48)
pCO2 arterial: 72 mmHg (ref 32–48)
pH, Arterial: 7.16 — CL (ref 7.35–7.45)
pH, Arterial: 7.28 — ABNORMAL LOW (ref 7.35–7.45)
pO2, Arterial: 378 mmHg — ABNORMAL HIGH (ref 83–108)
pO2, Arterial: 74 mmHg — ABNORMAL LOW (ref 83–108)

## 2022-06-12 LAB — BRAIN NATRIURETIC PEPTIDE: B Natriuretic Peptide: 467 pg/mL — ABNORMAL HIGH (ref 0.0–100.0)

## 2022-06-12 LAB — COMPREHENSIVE METABOLIC PANEL
ALT: 18 U/L (ref 0–44)
AST: 47 U/L — ABNORMAL HIGH (ref 15–41)
Albumin: 3.6 g/dL (ref 3.5–5.0)
Alkaline Phosphatase: 120 U/L (ref 38–126)
Anion gap: 14 (ref 5–15)
BUN: 29 mg/dL — ABNORMAL HIGH (ref 8–23)
CO2: 26 mmol/L (ref 22–32)
Calcium: 9.3 mg/dL (ref 8.9–10.3)
Chloride: 98 mmol/L (ref 98–111)
Creatinine, Ser: 1.23 mg/dL — ABNORMAL HIGH (ref 0.44–1.00)
GFR, Estimated: >60 mL/min (ref >60)
Glucose, Bld: 327 mg/dL — ABNORMAL HIGH (ref 70–99)
Potassium: 4.3 mmol/L (ref 3.5–5.1)
Sodium: 138 mmol/L (ref 135–145)
Total Bilirubin: 0.6 mg/dL (ref 0.3–1.2)
Total Protein: 7.8 g/dL (ref 6.5–8.1)

## 2022-06-12 LAB — URINALYSIS, ROUTINE W REFLEX MICROSCOPIC
Bilirubin Urine: NEGATIVE
Glucose, UA: >=500 mg/dL — AB
Ketones, ur: NEGATIVE mg/dL
Leukocytes,Ua: NEGATIVE
Nitrite: NEGATIVE
Protein, ur: >=300 mg/dL — AB
Specific Gravity, Urine: 1.028 (ref 1.005–1.030)
pH: 6 (ref 5.0–8.0)

## 2022-06-12 LAB — I-STAT CHEM 8, ED
BUN: 36 mg/dL — ABNORMAL HIGH (ref 8–23)
Calcium, Ion: 1.33 mmol/L (ref 1.15–1.40)
Chloride: 103 mmol/L (ref 98–111)
Creatinine, Ser: 1.2 mg/dL — ABNORMAL HIGH (ref 0.44–1.00)
Glucose, Bld: 307 mg/dL — ABNORMAL HIGH (ref 70–99)
HCT: 57 % — ABNORMAL HIGH (ref 36.0–46.0)
Hemoglobin: 19.4 g/dL — ABNORMAL HIGH (ref 12.0–15.0)
Potassium: 4.8 mmol/L (ref 3.5–5.1)
Sodium: 142 mmol/L (ref 135–145)
TCO2: 32 mmol/L (ref 22–32)

## 2022-06-12 LAB — RESP PANEL BY RT-PCR (RSV, FLU A&B, COVID)  RVPGX2
Influenza A by PCR: NEGATIVE
Influenza B by PCR: NEGATIVE
Resp Syncytial Virus by PCR: NEGATIVE
SARS Coronavirus 2 by RT PCR: NEGATIVE

## 2022-06-12 LAB — BETA-HYDROXYBUTYRIC ACID: Beta-Hydroxybutyric Acid: <0.05 mmol/L — ABNORMAL LOW (ref 0.05–0.27)

## 2022-06-12 LAB — MAGNESIUM: Magnesium: 2.9 mg/dL — ABNORMAL HIGH (ref 1.7–2.4)

## 2022-06-12 LAB — TROPONIN I (HIGH SENSITIVITY)
Troponin I (High Sensitivity): 44 ng/L — ABNORMAL HIGH (ref <18)
Troponin I (High Sensitivity): 95 ng/L — ABNORMAL HIGH (ref <18)

## 2022-06-12 LAB — OSMOLALITY: Osmolality: 328 mOsm/kg (ref 275–295)

## 2022-06-12 MED ORDER — ETOMIDATE 2 MG/ML IV SOLN
5.0000 mg | Freq: Once | INTRAVENOUS | Status: AC
  Administered 2022-06-12: 5 mg via INTRAVENOUS

## 2022-06-12 MED ORDER — ALBUTEROL SULFATE (2.5 MG/3ML) 0.083% IN NEBU
10.0000 mg/h | INHALATION_SOLUTION | RESPIRATORY_TRACT | Status: DC
  Administered 2022-06-12: 10 mg/h via RESPIRATORY_TRACT
  Filled 2022-06-12: qty 3

## 2022-06-12 MED ORDER — DOCUSATE SODIUM 50 MG/5ML PO LIQD
100.0000 mg | Freq: Two times a day (BID) | ORAL | Status: DC
  Administered 2022-06-12 – 2022-06-25 (×18): 100 mg
  Filled 2022-06-12 (×21): qty 10

## 2022-06-12 MED ORDER — CHLORHEXIDINE GLUCONATE CLOTH 2 % EX PADS
6.0000 | MEDICATED_PAD | Freq: Every day | CUTANEOUS | Status: DC
  Administered 2022-06-12 – 2022-07-28 (×50): 6 via TOPICAL

## 2022-06-12 MED ORDER — INSULIN ASPART 100 UNIT/ML IJ SOLN
0.0000 [IU] | INTRAMUSCULAR | Status: DC
  Administered 2022-06-13 (×2): 1 [IU] via SUBCUTANEOUS
  Administered 2022-06-13: 3 [IU] via SUBCUTANEOUS
  Administered 2022-06-14: 2 [IU] via SUBCUTANEOUS
  Administered 2022-06-14 – 2022-06-15 (×5): 1 [IU] via SUBCUTANEOUS
  Administered 2022-06-15 (×2): 2 [IU] via SUBCUTANEOUS
  Administered 2022-06-15 (×2): 1 [IU] via SUBCUTANEOUS
  Administered 2022-06-16 (×3): 2 [IU] via SUBCUTANEOUS
  Administered 2022-06-16: 1 [IU] via SUBCUTANEOUS
  Administered 2022-06-16: 2 [IU] via SUBCUTANEOUS
  Administered 2022-06-16: 4 [IU] via SUBCUTANEOUS
  Administered 2022-06-17: 1 [IU] via SUBCUTANEOUS
  Administered 2022-06-17: 3 [IU] via SUBCUTANEOUS
  Administered 2022-06-17 (×2): 1 [IU] via SUBCUTANEOUS
  Administered 2022-06-17: 3 [IU] via SUBCUTANEOUS
  Administered 2022-06-17 – 2022-06-18 (×2): 1 [IU] via SUBCUTANEOUS
  Administered 2022-06-18: 2 [IU] via SUBCUTANEOUS

## 2022-06-12 MED ORDER — IOHEXOL 350 MG/ML SOLN
75.0000 mL | Freq: Once | INTRAVENOUS | Status: AC | PRN
  Administered 2022-06-12: 75 mL via INTRAVENOUS

## 2022-06-12 MED ORDER — POLYETHYLENE GLYCOL 3350 17 G PO PACK
17.0000 g | PACK | Freq: Every day | ORAL | Status: DC
  Administered 2022-06-13 – 2022-06-18 (×6): 17 g
  Filled 2022-06-12 (×6): qty 1

## 2022-06-12 MED ORDER — ONDANSETRON HCL 4 MG PO TABS: 4.0000 mg | ORAL_TABLET | Freq: Four times a day (QID) | ORAL | Status: DC | PRN

## 2022-06-12 MED ORDER — ACETAMINOPHEN 650 MG RE SUPP: 650.0000 mg | Freq: Four times a day (QID) | RECTAL | Status: DC | PRN

## 2022-06-12 MED ORDER — METHYLPREDNISOLONE SODIUM SUCC 125 MG IJ SOLR
60.0000 mg | Freq: Two times a day (BID) | INTRAMUSCULAR | Status: AC
  Administered 2022-06-13 – 2022-06-14 (×4): 60 mg via INTRAVENOUS
  Filled 2022-06-12 (×4): qty 2

## 2022-06-12 MED ORDER — SUCCINYLCHOLINE CHLORIDE 200 MG/10ML IV SOSY
100.0000 mg | PREFILLED_SYRINGE | Freq: Once | INTRAVENOUS | Status: AC
  Administered 2022-06-12: 100 mg via INTRAVENOUS

## 2022-06-12 MED ORDER — IPRATROPIUM-ALBUTEROL 0.5-2.5 (3) MG/3ML IN SOLN
3.0000 mL | Freq: Four times a day (QID) | RESPIRATORY_TRACT | Status: DC
  Administered 2022-06-12 – 2022-06-13 (×3): 3 mL via RESPIRATORY_TRACT
  Filled 2022-06-12 (×4): qty 3

## 2022-06-12 MED ORDER — FENTANYL CITRATE PF 50 MCG/ML IJ SOSY
25.0000 ug | PREFILLED_SYRINGE | INTRAMUSCULAR | Status: DC | PRN
  Administered 2022-06-12: 25 ug via INTRAVENOUS
  Filled 2022-06-12: qty 1

## 2022-06-12 MED ORDER — PANTOPRAZOLE SODIUM 40 MG IV SOLR
40.0000 mg | Freq: Every day | INTRAVENOUS | Status: DC
  Administered 2022-06-13 – 2022-07-15 (×33): 40 mg via INTRAVENOUS
  Filled 2022-06-12 (×31): qty 10

## 2022-06-12 MED ORDER — ONDANSETRON HCL 4 MG/2ML IJ SOLN: 4.0000 mg | Freq: Four times a day (QID) | INTRAMUSCULAR | Status: DC | PRN

## 2022-06-12 MED ORDER — IPRATROPIUM-ALBUTEROL 0.5-2.5 (3) MG/3ML IN SOLN: 3.0000 mL | RESPIRATORY_TRACT | Status: DC | PRN

## 2022-06-12 MED ORDER — SODIUM CHLORIDE 0.9 % IV SOLN
100.0000 mg | Freq: Two times a day (BID) | INTRAVENOUS | Status: DC
  Administered 2022-06-12 – 2022-06-14 (×4): 100 mg via INTRAVENOUS
  Filled 2022-06-12 (×8): qty 100

## 2022-06-12 MED ORDER — PROPOFOL 1000 MG/100ML IV EMUL: 5.0000 ug/kg/min | INTRAVENOUS | Status: DC

## 2022-06-12 MED ORDER — LACTATED RINGERS IV BOLUS
1000.0000 mL | Freq: Once | INTRAVENOUS | Status: AC
  Administered 2022-06-12: 1000 mL via INTRAVENOUS

## 2022-06-12 MED ORDER — SODIUM CHLORIDE 0.9 % IV SOLN: INTRAVENOUS | Status: DC

## 2022-06-12 MED ORDER — METHYLPREDNISOLONE SODIUM SUCC 125 MG IJ SOLR
125.0000 mg | Freq: Once | INTRAMUSCULAR | Status: AC
  Administered 2022-06-12: 125 mg via INTRAVENOUS
  Filled 2022-06-12: qty 2

## 2022-06-12 MED ORDER — FENTANYL CITRATE PF 50 MCG/ML IJ SOSY
25.0000 ug | PREFILLED_SYRINGE | INTRAMUSCULAR | Status: DC | PRN
  Administered 2022-06-13 – 2022-06-15 (×2): 25 ug via INTRAVENOUS
  Administered 2022-06-16: 50 ug via INTRAVENOUS

## 2022-06-12 MED ORDER — LACTATED RINGERS IV SOLN: INTRAVENOUS | Status: DC

## 2022-06-12 MED ORDER — PROPOFOL 1000 MG/100ML IV EMUL
INTRAVENOUS | Status: AC
  Administered 2022-06-12: 25 ug/kg/min via INTRAVENOUS
  Filled 2022-06-12: qty 100

## 2022-06-12 MED ORDER — PROPOFOL 1000 MG/100ML IV EMUL
5.0000 ug/kg/min | INTRAVENOUS | Status: DC
  Administered 2022-06-12: 5 ug/kg/min via INTRAVENOUS

## 2022-06-12 MED ORDER — ACETAMINOPHEN 325 MG PO TABS
650.0000 mg | ORAL_TABLET | Freq: Four times a day (QID) | ORAL | Status: DC | PRN
  Administered 2022-07-02 – 2022-07-11 (×4): 650 mg
  Filled 2022-06-12 (×4): qty 2

## 2022-06-12 MED ORDER — ENOXAPARIN SODIUM 30 MG/0.3ML IJ SOSY
30.0000 mg | PREFILLED_SYRINGE | INTRAMUSCULAR | Status: DC
  Administered 2022-06-12 – 2022-06-30 (×19): 30 mg via SUBCUTANEOUS
  Filled 2022-06-12 (×19): qty 0.3

## 2022-06-12 NOTE — H&P (Addendum)
History and Physical    Carla [name] WUJ:[id number] DOB: October 16, 1945 DOA: 06/12/2022  PCP: Patient, [name]   Patient coming from: Home  *Patient's chart marked for merge*  I have personally briefly reviewed patient's old medical records in [facility]  Chief Complaint: Unresponsiveness  HPI: Carla [name] is a 77 y.o. female with medical history significant for emphysema, tobacco abuse, peripheral vascular disease, hypertension, prediabetes, anxiety.  At the time of my relation, patient is intubated, deeply sedated and unable to provide history.  History is obtained from chart review.  Patient's family was previously present at bedside. Patient was brought to the ED from home via EMS with reports of unresponsiveness.  EMS was called to the patient's home because patient had not been eating for the past 3 days, this morning she was altered.  On EMS arrival, patient was speaking unintelligibly, and subsequently became unresponsive.  Patient's son reported that patient has a long history of smoking, and is not on home O2.  ED Course: Temperature 97.7.  Heart rate 93-126.  Respiratory rate 17-27.  O2 sats 88 - 100% on ventilator.  Blood pressure systolic 106-190.  VBG showed pH of 7.0, pCO2 of 106. Blood glucose of 327, anion gap of 14, serum bicarb of 26.  Troponin- 44. EKG shows sinus tachycardia rate 117, QTc 490, biatrial enlargement.  Similar to prior EKG. Chest x-ray shows hyperinflation, suggestive of emphysema, ET tube approximately 3.2 cm above carina.   Head CT negative for acute abnormality, shows-generalized cerebral atrophy with widening of extra-axial spaces and ventricular dilatation. CTA chest -no PE, suggest atelectasis, noncalcified left upper lobe lung nodules. 125 mg Solu-Medrol given in ED Patient emergently intubated in ED.  Review of Systems: Not assessed, patient is intubated, sedated.Marland Kitchen  No past medical history on file.   has no history on file for  tobacco use, alcohol use, and drug use.  Not on File  Unable to assess due to sedation, mechanical ventilation.  Prior to Admission medications   Not on File    Physical Exam: Vitals:   06/12/22 2015 06/12/22 2030 06/12/22 2100 06/12/22 2115  BP: (!) 109/59 113/65 (!) 185/52 (!) 166/58  Pulse: 95 93 95 (!) 102  Resp: (!) 26 (!) 24 (!) 22 (!) 22  Temp:   (!) 97.4 F (36.3 C)   TempSrc:   Axillary   SpO2: (!) 89% (!) 88% 100% 100%  Weight:      Height:   5' (1.524 m)     Constitutional: intubated, sedated Vitals:   06/12/22 2015 06/12/22 2030 06/12/22 2100 06/12/22 2115  BP: (!) 109/59 113/65 (!) 185/52 (!) 166/58  Pulse: 95 93 95 (!) 102  Resp: (!) 26 (!) 24 (!) 22 (!) 22  Temp:   (!) 97.4 F (36.3 C)   TempSrc:   Axillary   SpO2: (!) 89% (!) 88% 100% 100%  Weight:      Height:   5' (1.524 m)    Eyes: Pupils small, but right pupil slightly bigger than left pupil. ENMT: Intubated, mechanical ventilation Neck: normal, supple, no masses, no thyromegaly Respiratory: Equal air entry bilaterally, no added sounds, intubated Cardiovascular: Tachycardic, regular rate and rhythm, no murmurs / rubs / gallops.  Extremeties warm.  No edema. Abdomen: Not distended, soft,.  Musculoskeletal: no clubbing / cyanosis. No joint deformity upper and lower extremities.  Skin: No purpura to bilateral upper extremities, no lesions, ulcers. No induration Neurologic: sedated, unable to assess Psychiatric: Sedated.  Labs on Admission: I have personally reviewed following labs and imaging studies  CBC: Recent Labs  Lab 06/12/22 1602 06/12/22 1606  WBC  --  6.9  NEUTROABS  --  4.7  HGB 19.4* 16.3*  HCT 57.0* 51.5*  MCV  --  99.2  PLT  --  [id number]   Basic Metabolic Panel: Recent Labs  Lab 06/12/22 1602 06/12/22 1606  NA 142 138  K 4.8 4.3  CL 103 98  CO2  --  26  GLUCOSE 307* 327*  BUN 36* 29*  CREATININE 1.20* 1.23*  CALCIUM  --  9.3  MG  --  2.9*   GFR: Estimated  Creatinine Clearance: 27.5 mL/min (A) (by C-G formula based on SCr of 1.23 mg/dL (H)). Liver Function Tests: Recent Labs  Lab 06/12/22 1606  AST 47*  ALT 18  ALKPHOS 120  BILITOT 0.6  PROT 7.8  ALBUMIN 3.6   Urine analysis:    Component Value Date/Time   COLORURINE YELLOW 06/12/2022 1825   APPEARANCEUR HAZY (A) 06/12/2022 1825   LABSPEC 1.028 06/12/2022 1825   PHURINE 6.0 06/12/2022 1825   GLUCOSEU >=500 (A) 06/12/2022 1825   HGBUR MODERATE (A) 06/12/2022 1825   BILIRUBINUR NEGATIVE 06/12/2022 1825   KETONESUR NEGATIVE 06/12/2022 1825   PROTEINUR >=300 (A) 06/12/2022 1825   NITRITE NEGATIVE 06/12/2022 1825   LEUKOCYTESUR NEGATIVE 06/12/2022 1825    Radiological Exams on Admission: CT Angio Chest PE W and/or Wo Contrast  Result Date: 06/12/2022 CLINICAL DATA:  Altered mental status, unresponsive. EXAM: CT ANGIOGRAPHY CHEST WITH CONTRAST TECHNIQUE: Multidetector CT imaging of the chest was performed using the standard protocol during bolus administration of intravenous contrast. Multiplanar CT image reconstructions and MIPs were obtained to evaluate the vascular anatomy. RADIATION DOSE REDUCTION: This exam was performed according to the departmental dose-optimization program which includes automated exposure control, adjustment of the mA and/or kV according to patient size and/or use of iterative reconstruction technique. CONTRAST:  22mL OMNIPAQUE IOHEXOL 350 MG/ML SOLN COMPARISON:  April 30, 2019 FINDINGS: Cardiovascular: There is marked severity calcification of the thoracic aorta. The ascending thoracic aorta measures 3.5 cm in diameter. Satisfactory opacification of the pulmonary arteries to the segmental level. No evidence of pulmonary embolism. Normal heart size with marked severity coronary artery calcification. Moderate severity left ventricular hypertrophy is also seen. No pericardial effusion. Mediastinum/Nodes: A properly placed endotracheal and nasogastric tubes are seen.  No enlarged mediastinal, hilar, or axillary lymph nodes. Thyroid gland, trachea, and esophagus demonstrate no significant findings. Lungs/Pleura: Mild centrilobular emphysematous lung disease. A 2 mm noncalcified lung nodule versus focal scar seen within the lateral aspect of the left upper lobe (axial CT image 41, CT series 13). A 4 mm noncalcified posterior left upper lobe lung nodule is also seen (axial CT image 73, CT series 13). There is mild anteromedial right middle lobe and posterior bibasilar atelectasis. There is no evidence of a pleural effusion or pneumothorax. Upper Abdomen: No acute abnormality. Musculoskeletal: Multilevel degenerative changes are seen throughout the thoracic spine. Review of the MIP images confirms the above findings. IMPRESSION: 1. No evidence of pulmonary embolism. 2. Mild anteromedial right middle lobe and posterior bibasilar atelectasis. 3. 2 mm and 4 mm noncalcified left upper lobe lung nodules. No follow-up needed if patient is low-risk (and has no known or suspected primary neoplasm). Non-contrast chest CT can be considered in 12 months if patient is high-risk. This recommendation follows the consensus statement: Guidelines for Management of Incidental Pulmonary Nodules Detected on CT  Images: From the Fleischner Society 2017; Radiology 2017; 284:228-243. 4. Aortic atherosclerosis. Aortic Atherosclerosis (ICD10-I70.0). Electronically Signed   By: Virgina Norfolk M.D.   On: 06/12/2022 17:46   CT Head Wo Contrast  Result Date: 06/12/2022 CLINICAL DATA:  Altered mental status. EXAM: CT HEAD WITHOUT CONTRAST TECHNIQUE: Contiguous axial images were obtained from the base of the skull through the vertex without intravenous contrast. RADIATION DOSE REDUCTION: This exam was performed according to the departmental dose-optimization program which includes automated exposure control, adjustment of the mA and/or kV according to patient size and/or use of iterative reconstruction  technique. COMPARISON:  None Available. FINDINGS: Brain: There is mild cerebral atrophy with widening of the extra-axial spaces and ventricular dilatation. There are areas of decreased attenuation within the white matter tracts of the supratentorial brain, consistent with microvascular disease changes. Vascular: No hyperdense vessel or unexpected calcification. Skull: Normal. Negative for fracture or focal lesion. Sinuses/Orbits: There is marked severity sphenoid sinus mucosal thickening. Other: None. IMPRESSION: 1. No acute intracranial abnormality. 2. Generalized cerebral atrophy with widening of the extra-axial spaces and ventricular dilatation. 3. Marked severity sphenoid sinus disease. Electronically Signed   By: Virgina Norfolk M.D.   On: 06/12/2022 17:39   DG Chest Portable 1 View  Result Date: 06/12/2022 CLINICAL DATA:  verify placement EXAM: PORTABLE CHEST 1 VIEW COMPARISON:  None Available. FINDINGS: Endotracheal tube tip is approximately 3.2 cm above carina. Gastric tube courses below the diaphragm in outside the field of view. Prominence of the lung markings, probably chronic scarring. No consolidation. Hyperinflation. No visible pleural effusions or pneumothorax. Cardiomediastinal silhouette is within normal limits. Polyarticular degenerative change. IMPRESSION: 1. Endotracheal tube tip is approximately 3.2 cm above carina. 2. Gastric tube courses below the diaphragm in outside the field of view. 3. Hyperinflation, suggestive of emphysema. Electronically Signed   By: Margaretha Sheffield M.D.   On: 06/12/2022 16:20    EKG: Independently reviewed.  Sinus tachycardia rate 170 10, QTc 490.  Biatrial enlargement.  No significant ST or T wave changes from prior.  Assessment/Plan Principal Problem:   Acute respiratory failure with hypoxia and hypercapnia (HCC) Active Problems:   COPD with acute exacerbation (HCC)   Hyperglycemia   Acute metabolic encephalopathy   HTN (hypertension)   Tobacco  abuse   Anxiety   Peripheral vascular disease (HCC)   Pulmonary nodules   Dementia (HCC)  Assessment and Plan: * Acute respiratory failure with hypoxia and hypercapnia (HCC) Unresponsive.  VBG obtained showed pH of 7.09, pCO2 of 106.  Likely due to COPD exacerbation.  No lower extremity swelling, BNP 467.  CT angio negative for PE, suggest atelectasis.  COVID, influenza, RSV negative. -Mechanically intubated and sedated -Continue IV propofol, add as needed fentanyl -X-ray in the morning -Repeat ABG now and in a.m. - Respiratory protocol, appreciate E Link PCCM recs -Steroids, antibiotics, DuoNebs -IV Protonix 40 daily - NPO - 1 L bolus given , continue N/s 100cc/hr  - TRoponin 44 > 95 , Trend, EKG unchanged   Acute metabolic encephalopathy With baseline Dementia.  Presented with unresponsiveness.  Head CT negative for acute abnormality.  Likely due to hypercarbic respiratory failure.  UA with many bacteria but negative for nitrites and leukocytes. WBC 6.9.  Afebrile.  No focus of infection identified at this time. -Mechanical ventilation,  -ABG - NPO -F/u Blood cultures -Obtain urine cultures -IV doxycycline for COPD exacerbation  Hyperglycemia Blood glucose 327. Not in DKA, anion gap of 14, serum bicarb of 26. - HgbA1c -  SSI- S -Monitor glucose while on steroids  COPD with acute exacerbation ([place]) Ongoing tobacco abuse.  Currently intubated and sedated. -125 mg Solu-Medrol given, continue 60 twice daily -IV doxycycline 100 twice daily ( QTc- 490) -DuoNebs as needed and scheduled -Monitor glucose closely   Pulmonary nodules Incidental finding on CT 06/12/2022 - 2 mm and 4 mm noncalcified left upper lobe lung nodules. No follow-up needed if patient is low-risk (and has no known or suspected primary neoplasm). Non-contrast chest CT can be considered in 12 months if patient is high-risk.  -Follow-up as outpatient  HTN (hypertension) Blood pressure fluctuates widely from  109-197. -Monitor for now -Appears she is only on Norvasc   DVT prophylaxis: Lovenox Code Status:  FULL CODE Family Communication: None presently at bedside. Disposition Plan: ~> 2 days Consults called:  Pls consult critical care in a.m. Admission status: Inpatient, ICU I certify that at the point of admission it is my clinical judgment that the patient will require inpatient hospital care spanning beyond 2 midnights from the point of admission due to high intensity of service, high risk for further deterioration and high frequency of surveillance required.   CRITICAL CARE Performed by: Bethena Roys   Total critical care time: 70 minutes  Critical care time was exclusive of separately billable procedures and treating other patients.  Critical care was necessary to treat or prevent imminent or life-threatening deterioration.  Critical care was time spent personally by me on the following activities: development of treatment plan with patient and/or surrogate as well as nursing, discussions with consultants, evaluation of patient's response to treatment, examination of patient, obtaining history from patient or surrogate, ordering and performing treatments and interventions, ordering and review of laboratory studies, ordering and review of radiographic studies, pulse oximetry and re-evaluation of patient's condition.    Author: Bethena Roys, MD 06/12/2022 10:13 PM  For on call review [email or web address].

## 2022-06-12 NOTE — Progress Notes (Addendum)
eLink Physician-Brief Progress Note Patient Name: Carla [name] DOB: Jun 09, 1945 MRN: [id number]   Date of Service  06/12/2022  HPI/Events of Note  Brought in by EMS for unresponsiveness after the patient had not been eating or getting out of bed for the past 3 days.  Patient was noted to be in acute hypercapnic respiratory failure, likely acute kidney injury, and mild troponin elevation with hemoconcentration.  Urinalysis with glucosuria, negative ketones.  Endotracheal tube 3.2 cm above the carina.  Gastric tube okay to use below the diaphragm.  eICU Interventions  Changed her vent to rate of 20, volume of 360, while maintaining PEEP and FiO2.  Appears comfortable on the ventilator, sedated to RASS of -2.  Following commands earlier.  Meds reviewed, agree with methylprednisolone and scheduled nebs.  As needed nebs available.  See Pat sedation available.  Maintain propofol for now.  No indication for vasopressors right now.  Agree with current care plan, no other acute intervention is indicated.  Infectious workup pending.     Intervention Category Major Interventions: Respiratory failure - evaluation and management Evaluation Type: New Patient Evaluation  Carla [name] 06/12/2022, 9:37 PM

## 2022-06-13 ENCOUNTER — Inpatient Hospital Stay (HOSPITAL_COMMUNITY): Payer: HMO

## 2022-06-13 DIAGNOSIS — J9622 Acute and chronic respiratory failure with hypercapnia: Secondary | ICD-10-CM

## 2022-06-13 DIAGNOSIS — J439 Emphysema, unspecified: Secondary | ICD-10-CM | POA: Diagnosis not present

## 2022-06-13 DIAGNOSIS — J9601 Acute respiratory failure with hypoxia: Secondary | ICD-10-CM | POA: Diagnosis not present

## 2022-06-13 DIAGNOSIS — J9602 Acute respiratory failure with hypercapnia: Secondary | ICD-10-CM | POA: Diagnosis not present

## 2022-06-13 DIAGNOSIS — J9621 Acute and chronic respiratory failure with hypoxia: Secondary | ICD-10-CM | POA: Diagnosis not present

## 2022-06-13 DIAGNOSIS — J9690 Respiratory failure, unspecified, unspecified whether with hypoxia or hypercapnia: Secondary | ICD-10-CM | POA: Diagnosis not present

## 2022-06-13 DIAGNOSIS — J441 Chronic obstructive pulmonary disease with (acute) exacerbation: Secondary | ICD-10-CM | POA: Diagnosis not present

## 2022-06-13 LAB — CBC
HCT: 42.7 % (ref 36.0–46.0)
Hemoglobin: 13.5 g/dL (ref 12.0–15.0)
MCH: 31.2 pg (ref 26.0–34.0)
MCHC: 31.6 g/dL (ref 30.0–36.0)
MCV: 98.6 fL (ref 80.0–100.0)
Platelets: 289 10*3/uL (ref 150–400)
RBC: 4.33 MIL/uL (ref 3.87–5.11)
RDW: 13.3 % (ref 11.5–15.5)
WBC: 8.2 10*3/uL (ref 4.0–10.5)
nRBC: 0 % (ref 0.0–0.2)

## 2022-06-13 LAB — BLOOD GAS, ARTERIAL
Acid-Base Excess: 4.5 mmol/L — ABNORMAL HIGH (ref 0.0–2.0)
Bicarbonate: 32.5 mmol/L — ABNORMAL HIGH (ref 20.0–28.0)
Drawn by: 21310
FIO2: 50 %
O2 Saturation: 95.8 %
Patient temperature: 37.3
pCO2 arterial: 64 mmHg — ABNORMAL HIGH (ref 32–48)
pH, Arterial: 7.32 — ABNORMAL LOW (ref 7.35–7.45)
pO2, Arterial: 70 mmHg — ABNORMAL LOW (ref 83–108)

## 2022-06-13 LAB — GLUCOSE, CAPILLARY
Glucose-Capillary: 102 mg/dL — ABNORMAL HIGH (ref 70–99)
Glucose-Capillary: 116 mg/dL — ABNORMAL HIGH (ref 70–99)
Glucose-Capillary: 117 mg/dL — ABNORMAL HIGH (ref 70–99)
Glucose-Capillary: 119 mg/dL — ABNORMAL HIGH (ref 70–99)
Glucose-Capillary: 131 mg/dL — ABNORMAL HIGH (ref 70–99)
Glucose-Capillary: 138 mg/dL — ABNORMAL HIGH (ref 70–99)
Glucose-Capillary: 204 mg/dL — ABNORMAL HIGH (ref 70–99)
Glucose-Capillary: 216 mg/dL — ABNORMAL HIGH (ref 70–99)

## 2022-06-13 LAB — BASIC METABOLIC PANEL
Anion gap: 8 (ref 5–15)
BUN: 34 mg/dL — ABNORMAL HIGH (ref 8–23)
CO2: 26 mmol/L (ref 22–32)
Calcium: 9.1 mg/dL (ref 8.9–10.3)
Chloride: 109 mmol/L (ref 98–111)
Creatinine, Ser: 1.28 mg/dL — ABNORMAL HIGH (ref 0.44–1.00)
GFR, Estimated: 43 mL/min — ABNORMAL LOW (ref >60)
Glucose, Bld: 106 mg/dL — ABNORMAL HIGH (ref 70–99)
Potassium: 3.2 mmol/L — ABNORMAL LOW (ref 3.5–5.1)
Sodium: 143 mmol/L (ref 135–145)

## 2022-06-13 LAB — MRSA NEXT GEN BY PCR, NASAL: MRSA by PCR Next Gen: NOT DETECTED

## 2022-06-13 LAB — TROPONIN I (HIGH SENSITIVITY): Troponin I (High Sensitivity): 100 ng/L (ref <18)

## 2022-06-13 LAB — HEMOGLOBIN A1C
Hgb A1c MFr Bld: 6 % — ABNORMAL HIGH (ref 4.8–5.6)
Mean Plasma Glucose: 126 mg/dL

## 2022-06-13 MED ORDER — POTASSIUM CHLORIDE 20 MEQ PO PACK
40.0000 meq | PACK | Freq: Once | ORAL | Status: AC
  Administered 2022-06-13: 40 meq
  Filled 2022-06-13: qty 2

## 2022-06-13 MED ORDER — REVEFENACIN 175 MCG/3ML IN SOLN
175.0000 ug | Freq: Every day | RESPIRATORY_TRACT | Status: DC
  Administered 2022-06-13 – 2022-08-18 (×63): 175 ug via RESPIRATORY_TRACT
  Filled 2022-06-13 (×67): qty 3

## 2022-06-13 MED ORDER — ALBUTEROL SULFATE (2.5 MG/3ML) 0.083% IN NEBU
2.5000 mg | INHALATION_SOLUTION | RESPIRATORY_TRACT | Status: DC | PRN
  Administered 2022-06-17: 2.5 mg via RESPIRATORY_TRACT
  Filled 2022-06-13: qty 3

## 2022-06-13 MED ORDER — BUDESONIDE 0.5 MG/2ML IN SUSP
0.5000 mg | Freq: Two times a day (BID) | RESPIRATORY_TRACT | Status: DC
  Administered 2022-06-13 – 2022-08-18 (×123): 0.5 mg via RESPIRATORY_TRACT
  Filled 2022-06-13 (×131): qty 2

## 2022-06-13 MED ORDER — PROSOURCE TF20 ENFIT COMPATIBL EN LIQD
60.0000 mL | Freq: Every day | ENTERAL | Status: DC
  Administered 2022-06-13 – 2022-06-14 (×2): 60 mL
  Filled 2022-06-13 (×2): qty 60

## 2022-06-13 MED ORDER — ORAL CARE MOUTH RINSE: 15.0000 mL | OROMUCOSAL | Status: DC | PRN

## 2022-06-13 MED ORDER — VITAL HIGH PROTEIN PO LIQD
1000.0000 mL | ORAL | Status: DC
  Administered 2022-06-13 – 2022-06-15 (×4): 1000 mL

## 2022-06-13 MED ORDER — ORAL CARE MOUTH RINSE
15.0000 mL | OROMUCOSAL | Status: DC
  Administered 2022-06-13 – 2022-07-12 (×341): 15 mL via OROMUCOSAL

## 2022-06-13 MED ORDER — ARFORMOTEROL TARTRATE 15 MCG/2ML IN NEBU
15.0000 ug | INHALATION_SOLUTION | Freq: Two times a day (BID) | RESPIRATORY_TRACT | Status: DC
  Administered 2022-06-13 – 2022-08-18 (×123): 15 ug via RESPIRATORY_TRACT
  Filled 2022-06-13 (×131): qty 2

## 2022-06-13 MED ORDER — FENTANYL 2500MCG IN NS 250ML (10MCG/ML) PREMIX INFUSION
0.0000 ug/h | INTRAVENOUS | Status: DC
  Administered 2022-06-13: 25 ug/h via INTRAVENOUS
  Administered 2022-06-14: 75 ug/h via INTRAVENOUS
  Administered 2022-06-15: 25 ug/h via INTRAVENOUS
  Filled 2022-06-13 (×4): qty 250

## 2022-06-13 NOTE — Progress Notes (Addendum)
PROGRESS NOTE    Carla [name]  ZOX:[id number] DOB: [birth year] DOA: 06/12/2022 PCP: Patient, [name]   Brief Narrative:    Carla [name] is a 77 y.o. female with medical history significant for emphysema, tobacco abuse, peripheral vascular disease, hypertension, prediabetes, anxiety. Patient was brought to the ED from home via EMS with reports of unresponsiveness.  EMS was called to the patient's home because patient had not been eating for the past 3 days, this morning she was altered.  While in EMS transport she became unresponsive and was emergently intubated in the ED.  She was admitted with acute hypercapnic encephalopathy in the setting of COPD exacerbation.  Pulmonology evaluation pending.  Assessment & Plan:   Principal Problem:   Acute respiratory failure with hypoxia and hypercapnia (HCC) Active Problems:   COPD with acute exacerbation (HCC)   Hyperglycemia   Acute metabolic encephalopathy   HTN (hypertension)   Tobacco abuse   Anxiety   Peripheral vascular disease (HCC)   Pulmonary nodules   Dementia (HCC)  Assessment and Plan:  Acute respiratory failure with hypoxia and hypercapnia (HCC) Unresponsive.  VBG obtained showed pH of 7.09, pCO2 of 106.  Likely due to COPD exacerbation.  No lower extremity swelling, BNP 467.  CT angio negative for PE, suggest atelectasis.  COVID, influenza, RSV negative. -Mechanically intubated and sedated -Continue Solu-Medrol, doxycycline, and scheduled DuoNebs -IV PPI daily -Appreciate pulmonology evaluation for ventilator weaning     Acute hypercapnic encephalopathy With baseline Dementia.  Presented with unresponsiveness.  Head CT negative for acute abnormality.  Likely due to hypercarbic respiratory failure.  UA with many bacteria but negative for nitrites and leukocytes. WBC 6.9.  Afebrile.  No focus of infection identified at this time. -Mechanical ventilation -ABG - NPO -F/u Blood cultures -Obtain urine cultures -IV  doxycycline for COPD exacerbation   Hyperglycemia-resolved Blood glucose 327. Not in DKA, anion gap of 14, serum bicarb of 26. - HgbA1c pending - SSI- S -Monitor glucose while on steroids  Hypokalemia -Replete and reevaluate   COPD with acute exacerbation (HCC) Ongoing tobacco abuse.  Currently intubated and sedated. -125 mg Solu-Medrol given, continue 60 twice daily -IV doxycycline 100 twice daily ( QTc- 490) -DuoNebs as needed and scheduled -Monitor glucose closely     Pulmonary nodules Incidental finding on CT 06/12/2022 - 2 mm and 4 mm noncalcified left upper lobe lung nodules. No follow-up needed if patient is low-risk (and has no known or suspected primary neoplasm). Non-contrast chest CT can be considered in 12 months if patient is high-risk.  -Follow-up as outpatient   HTN (hypertension) Blood pressure fluctuates widely from 109-197. -Monitor for now -Appears she is only on Norvasc    DVT prophylaxis:Lovenox Code Status: Full Family Communication: Son at bedside 3/21 Disposition Plan:  Status is: Inpatient Remains inpatient appropriate because: Need for IV medications   Consultants:  PCCM  Procedures:  None  Antimicrobials:  Anti-infectives (From admission, onward)    Start     Dose/Rate Route Frequency Ordered Stop   06/12/22 2230  doxycycline (VIBRAMYCIN) 100 mg in sodium chloride 0.9 % 250 mL IVPB        100 mg 125 mL/hr over 120 Minutes Intravenous Every 12 hours 06/12/22 2139         Subjective: Patient seen and evaluated today with no new acute complaints or concerns. No acute concerns or events noted overnight.  She remains intubated and sedated.  Son is at bedside.  Objective: Vitals:  06/13/22 0600 06/13/22 0615 06/13/22 0630 06/13/22 0632  BP: (!) 104/51 (!) 115/49 130/66   Pulse: 88 87 89   Resp: (!) 28 (!) 28 20   Temp:      TempSrc:      SpO2: 98% 98% 99%   Weight:    40.8 kg  Height:        Intake/Output Summary (Last 24  hours) at 06/13/2022 0748 Last data filed at 06/13/2022 [id number] Gross per 24 hour  Intake 2677.79 ml  Output 325 ml  Net 2352.79 ml   Filed Weights   06/12/22 1740 06/12/22 2100 06/13/22 0632  Weight: 51.3 kg 41.7 kg 40.8 kg    Examination:  General exam: Appears sedated, thin/frail Respiratory system: Clear to auscultation. Respiratory effort normal.  Intubated on mechanical ventilator. Cardiovascular system: S1 & S2 heard, RRR.  Gastrointestinal system: Abdomen is soft Central nervous system: Sedated Extremities: No edema Skin: No significant lesions noted Psychiatry: Flat affect.    Data Reviewed: I have personally reviewed following labs and imaging studies  CBC: Recent Labs  Lab 06/12/22 1602 06/12/22 1606 06/13/22 0526  WBC  --  6.9 8.2  NEUTROABS  --  4.7  --   HGB 19.4* 16.3* 13.5  HCT 57.0* 51.5* 42.7  MCV  --  99.2 98.6  PLT  --  344 [id number]   Basic Metabolic Panel: Recent Labs  Lab 06/12/22 1602 06/12/22 1606 06/13/22 0526  NA 142 138 143  K 4.8 4.3 3.2*  CL 103 98 109  CO2  --  26 26  GLUCOSE 307* 327* 106*  BUN 36* 29* 34*  CREATININE 1.20* 1.23* 1.28*  CALCIUM  --  9.3 9.1  MG  --  2.9*  --    GFR: Estimated Creatinine Clearance: 23.7 mL/min (A) (by C-G formula based on SCr of 1.28 mg/dL (H)). Liver Function Tests: Recent Labs  Lab 06/12/22 1606  AST 47*  ALT 18  ALKPHOS 120  BILITOT 0.6  PROT 7.8  ALBUMIN 3.6   No results for input(s): "LIPASE", "AMYLASE" in the last 168 hours. No results for input(s): "AMMONIA" in the last 168 hours. Coagulation Profile: No results for input(s): "INR", "PROTIME" in the last 168 hours. Cardiac Enzymes: No results for input(s): "CKTOTAL", "CKMB", "CKMBINDEX", "TROPONINI" in the last 168 hours. BNP (last 3 results) No results for input(s): "PROBNP" in the last 8760 hours. HbA1C: No results for input(s): "HGBA1C" in the last 72 hours. CBG: Recent Labs  Lab 06/12/22 2055 06/13/22 0010 06/13/22 0410   GLUCAP 216* 204* 116*   Lipid Profile: No results for input(s): "CHOL", "HDL", "LDLCALC", "TRIG", "CHOLHDL", "LDLDIRECT" in the last 72 hours. Thyroid Function Tests: No results for input(s): "TSH", "T4TOTAL", "FREET4", "T3FREE", "THYROIDAB" in the last 72 hours. Anemia Panel: No results for input(s): "VITAMINB12", "FOLATE", "FERRITIN", "TIBC", "IRON", "RETICCTPCT" in the last 72 hours. Sepsis Labs: No results for input(s): "PROCALCITON", "LATICACIDVEN" in the last 168 hours.  Recent Results (from the past 240 hour(s))  Resp panel by RT-PCR (RSV, Flu A&B, Covid) Anterior Nasal Swab     Status: None   Collection Time: 06/12/22  3:53 PM   Specimen: Anterior Nasal Swab  Result Value Ref Range Status   SARS Coronavirus 2 by RT PCR NEGATIVE NEGATIVE Final    Comment: (NOTE) SARS-CoV-2 target nucleic acids are NOT DETECTED.  The SARS-CoV-2 RNA is generally detectable in upper respiratory specimens during the acute phase of infection. The lowest concentration of SARS-CoV-2 viral copies this assay  can detect is 138 copies/mL. A negative result does not preclude SARS-Cov-2 infection and should not be used as the sole basis for treatment or other patient management decisions. A negative result may occur with  improper specimen collection/handling, submission of specimen other than nasopharyngeal swab, presence of viral mutation(s) within the areas targeted by this assay, and inadequate number of viral copies(<138 copies/mL). A negative result must be combined with clinical observations, patient history, and epidemiological information. The expected result is Negative.  Fact Sheet for Patients:  EntrepreneurPulse.com.au  Fact Sheet for Healthcare Providers:  IncredibleEmployment.be  This test is no t yet approved or cleared by the [place] FDA and  has been authorized for detection and/or diagnosis of SARS-CoV-2 by FDA under an Emergency Use  Authorization (EUA). This EUA will remain  in effect (meaning this test can be used) for the duration of the COVID-19 declaration under Section 564(b)(1) of the Act, 21 U.S.C.section 360bbb-3(b)(1), unless the authorization is terminated  or revoked sooner.       Influenza A by PCR NEGATIVE NEGATIVE Final   Influenza B by PCR NEGATIVE NEGATIVE Final    Comment: (NOTE) The Xpert Xpress SARS-CoV-2/FLU/RSV plus assay is intended as an aid in the diagnosis of influenza from Nasopharyngeal swab specimens and should not be used as a sole basis for treatment. Nasal washings and aspirates are unacceptable for Xpert Xpress SARS-CoV-2/FLU/RSV testing.  Fact Sheet for Patients: EntrepreneurPulse.com.au  Fact Sheet for Healthcare Providers: IncredibleEmployment.be  This test is not yet approved or cleared by the [place] FDA and has been authorized for detection and/or diagnosis of SARS-CoV-2 by FDA under an Emergency Use Authorization (EUA). This EUA will remain in effect (meaning this test can be used) for the duration of the COVID-19 declaration under Section 564(b)(1) of the Act, 21 U.S.C. section 360bbb-3(b)(1), unless the authorization is terminated or revoked.     Resp Syncytial Virus by PCR NEGATIVE NEGATIVE Final    Comment: (NOTE) Fact Sheet for Patients: EntrepreneurPulse.com.au  Fact Sheet for Healthcare Providers: IncredibleEmployment.be  This test is not yet approved or cleared by the [place] FDA and has been authorized for detection and/or diagnosis of SARS-CoV-2 by FDA under an Emergency Use Authorization (EUA). This EUA will remain in effect (meaning this test can be used) for the duration of the COVID-19 declaration under Section 564(b)(1) of the Act, 21 U.S.C. section 360bbb-3(b)(1), unless the authorization is terminated or revoked.  Performed at [facility], [address]  [place], [place] [address]   Blood Culture (routine x 2)     Status: None (Preliminary result)   Collection Time: 06/12/22  6:10 PM   Specimen: BLOOD  Result Value Ref Range Status   Specimen Description BLOOD BLOOD RIGHT ARM  Final   Special Requests   Final    BOTTLES DRAWN AEROBIC AND ANAEROBIC Blood Culture adequate volume Performed at [facility], [address] [place], [place] [address]    Culture PENDING  Incomplete   Report Status PENDING  Incomplete  Blood Culture (routine x 2)     Status: None (Preliminary result)   Collection Time: 06/12/22  6:31 PM   Specimen: BLOOD  Result Value Ref Range Status   Specimen Description BLOOD BLOOD RIGHT ARM  Final   Special Requests   Final    BOTTLES DRAWN AEROBIC AND ANAEROBIC Blood Culture adequate volume Performed at [facility], [address], [place], [place] [address]    Culture PENDING  Incomplete   Report  Status PENDING  Incomplete  MRSA Next Gen by PCR, Nasal     Status: None   Collection Time: 06/12/22  9:01 PM   Specimen: Nasal Mucosa; Nasal Swab  Result Value Ref Range Status   MRSA by PCR Next Gen NOT DETECTED NOT DETECTED Final    Comment: (NOTE) The GeneXpert MRSA Assay (FDA approved for NASAL specimens only), is one component of a comprehensive MRSA colonization surveillance program. It is not intended to diagnose MRSA infection nor to guide or monitor treatment for MRSA infections. Test performance is not FDA approved in patients less than 19 years old. Performed at [facility], [address] [place], [place] [address]          Radiology Studies: Portable Chest xray  Result Date: 06/13/2022 CLINICAL DATA:  [id number].  Ventilator dependent respiratory failure. EXAM: PORTABLE CHEST 1 VIEW COMPARISON:  Portable chest yesterday at 4:07 p.m. CTA chest 06/12/2022. FINDINGS: 4:42 a.m. The lungs are emphysematous with mild coarse chronic interstitial changes, diffuse bronchial thickening. No focal consolidation is seen  and no pleural effusion. There is aortic tortuosity and calcification with stable mediastinum. The cardiac size is normal. There is osteopenia with thoracic spondylosis. IMPRESSION: No acute chest findings. Emphysema and chronic interstitial changes. Aortic atherosclerosis. No significant change from yesterday's exam. Electronically Signed   By: Telford Nab M.D.   On: 06/13/2022 06:35   CT Angio Chest PE W and/or Wo Contrast  Result Date: 06/12/2022 CLINICAL DATA:  Altered mental status, unresponsive. EXAM: CT ANGIOGRAPHY CHEST WITH CONTRAST TECHNIQUE: Multidetector CT imaging of the chest was performed using the standard protocol during bolus administration of intravenous contrast. Multiplanar CT image reconstructions and MIPs were obtained to evaluate the vascular anatomy. RADIATION DOSE REDUCTION: This exam was performed according to the departmental dose-optimization program which includes automated exposure control, adjustment of the mA and/or kV according to patient size and/or use of iterative reconstruction technique. CONTRAST:  24mL OMNIPAQUE IOHEXOL 350 MG/ML SOLN COMPARISON:  April 30, 2019 FINDINGS: Cardiovascular: There is marked severity calcification of the thoracic aorta. The ascending thoracic aorta measures 3.5 cm in diameter. Satisfactory opacification of the pulmonary arteries to the segmental level. No evidence of pulmonary embolism. Normal heart size with marked severity coronary artery calcification. Moderate severity left ventricular hypertrophy is also seen. No pericardial effusion. Mediastinum/Nodes: A properly placed endotracheal and nasogastric tubes are seen. No enlarged mediastinal, hilar, or axillary lymph nodes. Thyroid gland, trachea, and esophagus demonstrate no significant findings. Lungs/Pleura: Mild centrilobular emphysematous lung disease. A 2 mm noncalcified lung nodule versus focal scar seen within the lateral aspect of the left upper lobe (axial CT image 41, CT  series 13). A 4 mm noncalcified posterior left upper lobe lung nodule is also seen (axial CT image 73, CT series 13). There is mild anteromedial right middle lobe and posterior bibasilar atelectasis. There is no evidence of a pleural effusion or pneumothorax. Upper Abdomen: No acute abnormality. Musculoskeletal: Multilevel degenerative changes are seen throughout the thoracic spine. Review of the MIP images confirms the above findings. IMPRESSION: 1. No evidence of pulmonary embolism. 2. Mild anteromedial right middle lobe and posterior bibasilar atelectasis. 3. 2 mm and 4 mm noncalcified left upper lobe lung nodules. No follow-up needed if patient is low-risk (and has no known or suspected primary neoplasm). Non-contrast chest CT can be considered in 12 months if patient is high-risk. This recommendation follows the consensus statement: Guidelines for Management of Incidental Pulmonary Nodules Detected on CT Images: From the Fleischner  Society 2017; Radiology 2017; 284:228-243. 4. Aortic atherosclerosis. Aortic Atherosclerosis (ICD10-I70.0). Electronically Signed   By: Virgina Norfolk M.D.   On: 06/12/2022 17:46   CT Head Wo Contrast  Result Date: 06/12/2022 CLINICAL DATA:  Altered mental status. EXAM: CT HEAD WITHOUT CONTRAST TECHNIQUE: Contiguous axial images were obtained from the base of the skull through the vertex without intravenous contrast. RADIATION DOSE REDUCTION: This exam was performed according to the departmental dose-optimization program which includes automated exposure control, adjustment of the mA and/or kV according to patient size and/or use of iterative reconstruction technique. COMPARISON:  None Available. FINDINGS: Brain: There is mild cerebral atrophy with widening of the extra-axial spaces and ventricular dilatation. There are areas of decreased attenuation within the white matter tracts of the supratentorial brain, consistent with microvascular disease changes. Vascular: No  hyperdense vessel or unexpected calcification. Skull: Normal. Negative for fracture or focal lesion. Sinuses/Orbits: There is marked severity sphenoid sinus mucosal thickening. Other: None. IMPRESSION: 1. No acute intracranial abnormality. 2. Generalized cerebral atrophy with widening of the extra-axial spaces and ventricular dilatation. 3. Marked severity sphenoid sinus disease. Electronically Signed   By: Virgina Norfolk M.D.   On: 06/12/2022 17:39   DG Chest Portable 1 View  Result Date: 06/12/2022 CLINICAL DATA:  verify placement EXAM: PORTABLE CHEST 1 VIEW COMPARISON:  None Available. FINDINGS: Endotracheal tube tip is approximately 3.2 cm above carina. Gastric tube courses below the diaphragm in outside the field of view. Prominence of the lung markings, probably chronic scarring. No consolidation. Hyperinflation. No visible pleural effusions or pneumothorax. Cardiomediastinal silhouette is within normal limits. Polyarticular degenerative change. IMPRESSION: 1. Endotracheal tube tip is approximately 3.2 cm above carina. 2. Gastric tube courses below the diaphragm in outside the field of view. 3. Hyperinflation, suggestive of emphysema. Electronically Signed   By: Margaretha Sheffield M.D.   On: 06/12/2022 16:20        Scheduled Meds:  Chlorhexidine Gluconate Cloth  6 each Topical Daily   docusate  100 mg Per Tube BID   enoxaparin (LOVENOX) injection  30 mg Subcutaneous Q24H   insulin aspart  0-9 Units Subcutaneous Q4H   ipratropium-albuterol  3 mL Nebulization Q6H   methylPREDNISolone (SOLU-MEDROL) injection  60 mg Intravenous Q12H   pantoprazole (PROTONIX) IV  40 mg Intravenous Daily   polyethylene glycol  17 g Per Tube Daily   potassium chloride  40 mEq Per Tube Once   Continuous Infusions:  sodium chloride 100 mL/hr at 06/13/22 0724   doxycycline (VIBRAMYCIN) IV Stopped (06/13/22 0105)   fentaNYL infusion INTRAVENOUS 25 mcg/hr (06/13/22 0624)     LOS: 1 day    Total critical  care time: 35 minutes    Dashanae Longfield D Manuella Ghazi, DO [facility]  If 7PM-7AM, please contact night-coverage [email or web address] 06/13/2022, 7:48 AM

## 2022-06-13 NOTE — Consult Note (Addendum)
NAME:  Carla [name], MRN:  [id number], DOB:  Jan 01, 1946, LOS: 1 ADMISSION DATE:  06/12/2022, CONSULTATION DATE:  06/13/2022 REFERRING MD:  Dr. Sherryll Burger, [facility], CHIEF COMPLAINT:  short of breath   History of Present Illness:  77 yo female smoker with hx of COPD from emphysema found by family unresponsive and brought to ER.  Found to have acute on chronic hypercapnia on ABG.  Intubated in ER.  She was started on therapy for COPD exacerbation.  PCCM consulted to assist with respiratory management.  Pertinent  Medical History  Hypertension, Dementia, Anxiety, Depression, GERD, Primary hyperparathyroidism, Vit D deficiency, HLD, Osteoporosis  Significant Hospital Events: Including procedures, antibiotic start and stop dates in addition to other pertinent events   3/20 Admit, intubated  Studies:  CT angio chest 06/12/22 >> atherosclerosis, severe coronary calcification, severe LVH, mild centrilobular emphysema, 2 mm nodule LUL, 4 mm nodule LUL  Interim History / Subjective:  Remains on sedation and vent support.  Her son says with her dementia she is able to keep up with her ADLs.  She has auditory and visual hallucinations intermittently.  She is not consistent with taking her medications and doesn't use any inhalers at home.  For the past 3 or 4 days she was getting more sleepy and not eating.  Her son wanted to get her to the hospital, but she refused.  Things progressed to the point she was very lethargic, and had shallow breathing with wheezing.  She was then brought to the ER.  Objective   Blood pressure (!) 125/47, pulse 86, temperature 97.7 F (36.5 C), temperature source Axillary, resp. rate (!) 29, height 5' (1.524 m), weight 40.8 kg, SpO2 94 %.    Vent Mode: PRVC FiO2 (%):  [40 %-100 %] 50 % Set Rate:  [16 bmp-24 bmp] 24 bmp Vt Set:  [308 mL-360 mL] 360 mL PEEP:  [5 cmH20] 5 cmH20 Plateau Pressure:  [13 cmH20-16 cmH20] 15 cmH20   Intake/Output Summary (Last 24 hours) at 06/13/2022  1059 Last data filed at 06/13/2022 4098 Gross per 24 hour  Intake 2677.79 ml  Output 325 ml  Net 2352.79 ml   Filed Weights   06/12/22 1740 06/12/22 2100 06/13/22 1191  Weight: 51.3 kg 41.7 kg 40.8 kg    Examination:  General - sedated Eyes - pupils reactive ENT - ETT in place Cardiac - regular rate/rhythm, no murmur Chest - equal breath sounds b/l, no wheezing or rales Abdomen - soft, non tender, + bowel sounds Extremities - no cyanosis, clubbing, or edema Skin - no rashes Neuro - RASS -2  Resolved Hospital Problem list     Assessment & Plan:   Acute on chronic hypoxia/hypercapnic respiratory failure from COPD exacerbation. COPD with emphysema. Tobacco abuse. - goal SpO2 > 90% - adjust vent settings as needed to avoid PEEPi - day 2 of doxycycline  - continue solumedrol - change to yupelri, brovana, pulmicort - prn albuterol  Elevated troponin likely from demand ischemia. Coronary calcification on CT chest. - check Echo  Lung nodules. - f/u imaging as outpt  Acute metabolic encephalopathy from hypercapnia. Hx of dementia, anxiety, depression. - RASS goal 0 to -1  Best Practice (right click and "Reselect all SmartList Selections" daily)   Diet/type: tubefeeds DVT prophylaxis: LMWH GI prophylaxis: PPI Lines: N/A Foley:  N/A Code Status:  full code Last date of multidisciplinary goals of care discussion [updated pt's son at bedside and granddaughter (who is POA) over the phone]  Labs  CBC: Recent Labs  Lab 06/12/22 1602 06/12/22 1606 06/13/22 0526  WBC  --  6.9 8.2  NEUTROABS  --  4.7  --   HGB 19.4* 16.3* 13.5  HCT 57.0* 51.5* 42.7  MCV  --  99.2 98.6  PLT  --  344 [id number]    Basic Metabolic Panel: Recent Labs  Lab 06/12/22 1602 06/12/22 1606 06/13/22 0526  NA 142 138 143  K 4.8 4.3 3.2*  CL 103 98 109  CO2  --  26 26  GLUCOSE 307* 327* 106*  BUN 36* 29* 34*  CREATININE 1.20* 1.23* 1.28*  CALCIUM  --  9.3 9.1  MG  --  2.9*  --     GFR: Estimated Creatinine Clearance: 23.7 mL/min (A) (by C-G formula based on SCr of 1.28 mg/dL (H)). Recent Labs  Lab 06/12/22 1606 06/13/22 0526  WBC 6.9 8.2    Liver Function Tests: Recent Labs  Lab 06/12/22 1606  AST 47*  ALT 18  ALKPHOS 120  BILITOT 0.6  PROT 7.8  ALBUMIN 3.6   No results for input(s): "LIPASE", "AMYLASE" in the last 168 hours. No results for input(s): "AMMONIA" in the last 168 hours.  ABG    Component Value Date/Time   PHART 7.32 (L) 06/13/2022 0500   PCO2ART 64 (H) 06/13/2022 0500   PO2ART 70 (L) 06/13/2022 0500   HCO3 32.5 (H) 06/13/2022 0500   TCO2 32 06/12/2022 1602   ACIDBASEDEF 2.4 (H) 06/12/2022 1606   O2SAT 95.8 06/13/2022 0500     Coagulation Profile: No results for input(s): "INR", "PROTIME" in the last 168 hours.  Cardiac Enzymes: No results for input(s): "CKTOTAL", "CKMB", "CKMBINDEX", "TROPONINI" in the last 168 hours.  HbA1C: No results found for: "HGBA1C"  CBG: Recent Labs  Lab 06/12/22 2055 06/13/22 0010 06/13/22 0410 06/13/22 0802  GLUCAP 216* 204* 116* 119*    Review of Systems:   Unable to obtain  Surgical History:  Hysterectomy, Colonoscopy, Cholecystectomy, Aorto-bifem graft  Social History:  Every day smoker.  Family History:  Diabetes in her sisters.  Allergies Allergies  Allergen Reactions   Aricept [Donepezil Hcl] Diarrhea   Calcium-Containing Compounds    Exelon [Rivastigmine] Rash     Home Medications  Prior to Admission medications   Not on File     Critical care time: 42 minutes  Chesley Mires, MD [place] Pager - [phone] or [phone] 06/13/2022, 1:30 PM

## 2022-06-14 ENCOUNTER — Inpatient Hospital Stay (HOSPITAL_COMMUNITY): Payer: HMO

## 2022-06-14 ENCOUNTER — Other Ambulatory Visit (HOSPITAL_COMMUNITY): Payer: Self-pay | Admitting: *Deleted

## 2022-06-14 DIAGNOSIS — R0602 Shortness of breath: Secondary | ICD-10-CM | POA: Diagnosis not present

## 2022-06-14 DIAGNOSIS — R7989 Other specified abnormal findings of blood chemistry: Secondary | ICD-10-CM | POA: Diagnosis not present

## 2022-06-14 DIAGNOSIS — J439 Emphysema, unspecified: Secondary | ICD-10-CM | POA: Diagnosis not present

## 2022-06-14 DIAGNOSIS — J9602 Acute respiratory failure with hypercapnia: Secondary | ICD-10-CM | POA: Diagnosis not present

## 2022-06-14 DIAGNOSIS — J811 Chronic pulmonary edema: Secondary | ICD-10-CM | POA: Diagnosis not present

## 2022-06-14 DIAGNOSIS — J9601 Acute respiratory failure with hypoxia: Secondary | ICD-10-CM | POA: Diagnosis not present

## 2022-06-14 DIAGNOSIS — J449 Chronic obstructive pulmonary disease, unspecified: Secondary | ICD-10-CM | POA: Diagnosis not present

## 2022-06-14 LAB — BASIC METABOLIC PANEL
Anion gap: 6 (ref 5–15)
BUN: 49 mg/dL — ABNORMAL HIGH (ref 8–23)
CO2: 25 mmol/L (ref 22–32)
Calcium: 9.7 mg/dL (ref 8.9–10.3)
Chloride: 112 mmol/L — ABNORMAL HIGH (ref 98–111)
Creatinine, Ser: 1.25 mg/dL — ABNORMAL HIGH (ref 0.44–1.00)
GFR, Estimated: 44 mL/min — ABNORMAL LOW (ref >60)
Glucose, Bld: 155 mg/dL — ABNORMAL HIGH (ref 70–99)
Potassium: 4.6 mmol/L (ref 3.5–5.1)
Sodium: 143 mmol/L (ref 135–145)

## 2022-06-14 LAB — GLUCOSE, CAPILLARY
Glucose-Capillary: 148 mg/dL — ABNORMAL HIGH (ref 70–99)
Glucose-Capillary: 145 mg/dL — ABNORMAL HIGH (ref 70–99)
Glucose-Capillary: 118 mg/dL — ABNORMAL HIGH (ref 70–99)
Glucose-Capillary: 174 mg/dL — ABNORMAL HIGH (ref 70–99)
Glucose-Capillary: 128 mg/dL — ABNORMAL HIGH (ref 70–99)
Glucose-Capillary: 86 mg/dL (ref 70–99)

## 2022-06-14 LAB — BLOOD GAS, ARTERIAL
Acid-Base Excess: 0.4 mmol/L (ref 0.0–2.0)
Bicarbonate: 28 mmol/L (ref 20.0–28.0)
Drawn by: 38235
FIO2: 35 %
O2 Saturation: 94.6 %
Patient temperature: 37
pCO2 arterial: 57 mmHg — ABNORMAL HIGH (ref 32–48)
pH, Arterial: 7.3 — ABNORMAL LOW (ref 7.35–7.45)
pO2, Arterial: 64 mmHg — ABNORMAL LOW (ref 83–108)

## 2022-06-14 LAB — ECHOCARDIOGRAM COMPLETE
AR max vel: 2.14 cm2
AV Area VTI: 2.21 cm2
AV Area mean vel: 2.32 cm2
AV Mean grad: 7.6 mmHg
AV Peak grad: 14 mmHg
Ao pk vel: 1.87 m/s
Area-P 1/2: 3.27 cm2
Height: 60 in
P 1/2 time: 613 msec
S' Lateral: 2.6 cm
Weight: 1562.62 oz

## 2022-06-14 LAB — POCT I-STAT 7, (LYTES, BLD GAS, ICA,H+H)
Acid-Base Excess: 1 mmol/L (ref 0.0–2.0)
Bicarbonate: 28.4 mmol/L — ABNORMAL HIGH (ref 20.0–28.0)
Calcium, Ion: 1.55 mmol/L (ref 1.15–1.40)
HCT: 36 % (ref 36.0–46.0)
Hemoglobin: 12.2 g/dL (ref 12.0–15.0)
O2 Saturation: 91 %
Patient temperature: 97.5
Potassium: 4.7 mmol/L (ref 3.5–5.1)
Sodium: 148 mmol/L — ABNORMAL HIGH (ref 135–145)
TCO2: 30 mmol/L (ref 22–32)
pCO2 arterial: 54.2 mmHg — ABNORMAL HIGH (ref 32–48)
pH, Arterial: 7.324 — ABNORMAL LOW (ref 7.35–7.45)
pO2, Arterial: 66 mmHg — ABNORMAL LOW (ref 83–108)

## 2022-06-14 LAB — CBC
HCT: 39.8 % (ref 36.0–46.0)
Hemoglobin: 12.2 g/dL (ref 12.0–15.0)
MCH: 30.8 pg (ref 26.0–34.0)
MCHC: 30.7 g/dL (ref 30.0–36.0)
MCV: 100.5 fL — ABNORMAL HIGH (ref 80.0–100.0)
Platelets: 266 10*3/uL (ref 150–400)
RBC: 3.96 MIL/uL (ref 3.87–5.11)
RDW: 13.9 % (ref 11.5–15.5)
WBC: 8.7 10*3/uL (ref 4.0–10.5)
nRBC: 0 % (ref 0.0–0.2)

## 2022-06-14 LAB — PHOSPHORUS: Phosphorus: 2.7 mg/dL (ref 2.5–4.6)

## 2022-06-14 LAB — MAGNESIUM: Magnesium: 2.4 mg/dL (ref 1.7–2.4)

## 2022-06-14 MED ORDER — VITAL AF 1.2 CAL PO LIQD
1000.0000 mL | ORAL | Status: DC
  Administered 2022-06-14 – 2022-06-16 (×4): 1000 mL

## 2022-06-14 MED ORDER — SODIUM CHLORIDE 0.9 % IV SOLN
2.0000 g | INTRAVENOUS | Status: DC
  Administered 2022-06-14 – 2022-06-20 (×7): 2 g via INTRAVENOUS
  Filled 2022-06-14 (×7): qty 20

## 2022-06-14 MED ORDER — PREDNISONE 20 MG PO TABS
20.0000 mg | ORAL_TABLET | Freq: Every day | ORAL | Status: DC
  Administered 2022-06-15 – 2022-06-18 (×4): 20 mg
  Filled 2022-06-14 (×4): qty 1

## 2022-06-14 NOTE — Progress Notes (Addendum)
eLink Physician-Brief Progress Note Patient Name:  C  DOB:  MRN:    Date of Service  06/14/2022  HPI/Events of Note  77 yr old female with COPD admitted with acute on chronic hypercapneic resp failure related to COPD exac.  Haemophilus bacteremia and mild renal insuff.  Transfer from [facility] for assistance with management.  eICU Interventions  Chart reviewed     Intervention Category Evaluation Type: New Patient Evaluation  Aoife Bold, P 06/14/2022, 7:43 PM 

## 2022-06-14 NOTE — Progress Notes (Addendum)
NAME:  Carla [name], MRN:  [id number], DOB:  May 01, 1945, LOS: 2 ADMISSION DATE:  06/12/2022, CONSULTATION DATE:  06/13/2022 REFERRING MD:  Dr. Sherryll Burger, [facility], CHIEF COMPLAINT:  short of breath   History of Present Illness:  77 yo female smoker with hx of COPD from emphysema found by family unresponsive and brought to ER.  Found to have acute on chronic hypercapnia on ABG.  Intubated in ER.  She was started on therapy for COPD exacerbation.  PCCM consulted to assist with respiratory management.  Pertinent  Medical History  Hypertension, Dementia, Anxiety, Depression, GERD, Primary hyperparathyroidism, Vit D deficiency, HLD, Osteoporosis  Significant Hospital Events: Including procedures, antibiotic start and stop dates in addition to other pertinent events   3/20 Admit, intubated  Studies:  CT angio chest 06/12/22 >> atherosclerosis, severe coronary calcification, severe LVH, mild centrilobular emphysema, 2 mm nodule LUL, 4 mm nodule LUL Blood culture 06/12/22 >> Haemophilus influenzae Echo 06/14/22 >> EF 60 to 65%, mod LVH, grade 1 DD, mild/mod AR  Interim History / Subjective:  Blood culture positive for H flu and ABx changed.  Had oxygen desaturation and low Vt with SBT this morning.  Objective   Blood pressure (!) 152/52, pulse 74, temperature 98.2 F (36.8 C), temperature source Axillary, resp. rate (!) 25, height 5' (1.524 m), weight 44.3 kg, SpO2 96 %.    Vent Mode: PRVC FiO2 (%):  [35 %-70 %] 55 % Set Rate:  [18 bmp-24 bmp] 24 bmp Vt Set:  [360 mL] 360 mL PEEP:  [5 cmH20] 5 cmH20 Plateau Pressure:  [13 cmH20-18 cmH20] 18 cmH20   Intake/Output Summary (Last 24 hours) at 06/14/2022 1341 Last data filed at 06/14/2022 1337 Gross per 24 hour  Intake 2999.17 ml  Output 200 ml  Net 2799.17 ml   Filed Weights   06/12/22 2100 06/13/22 0632 06/14/22 0500  Weight: 41.7 kg 40.8 kg 44.3 kg    Examination:  General - sedated Eyes - pupils reactive ENT - ETT in place Cardiac -  regular rate/rhythm, no murmur Chest - equal breath sounds b/l, no wheezing or rales Abdomen - soft, non tender, + bowel sounds Extremities - no cyanosis, clubbing, or edema Skin - no rashes Neuro - RASS -2  Resolved Hospital Problem list     Assessment & Plan:   Acute on chronic hypoxia/hypercapnic respiratory failure from COPD exacerbation. COPD with emphysema. Tobacco abuse. - likely will be slow vent weaning process; transfer to [facility] - goal SpO2 > 90% - can change to prednisone per tube on 3/23 - continue yupelri, brovana, pulmicort - prn albuterol - f/u CXR intermittently  Haemophilus influenzae bacteremia. - day 3 of ABx, currently on rocephin  Elevated troponin likely from demand ischemia. Coronary calcification on CT chest. - preserved EF on Echo - f/u with cardiology as outpt  Lung nodules. - f/u imaging as outpt  Acute metabolic encephalopathy from hypercapnia. Hx of dementia, anxiety, depression. - RASS goal 0 to -1  D/w Dr. Sherryll Burger and communicated with [facility] team about need for transfer.  Best Practice (right click and "Reselect all SmartList Selections" daily)   Diet/type: tubefeeds DVT prophylaxis: LMWH GI prophylaxis: PPI Lines: N/A Foley:  N/A Code Status:  full code Last date of multidisciplinary goals of care discussion [updated pt's son at bedside]  Labs       Latest Ref Rng & Units 06/14/2022    4:30 AM 06/13/2022    5:26 AM 06/12/2022    4:06 PM  CMP  Glucose 70 - 99 mg/dL 155  106  327   BUN 8 - 23 mg/dL 49  34  29   Creatinine 0.44 - 1.00 mg/dL 1.25  1.28  1.23   Sodium 135 - 145 mmol/L 143  143  138   Potassium 3.5 - 5.1 mmol/L 4.6  3.2  4.3   Chloride 98 - 111 mmol/L 112  109  98   CO2 22 - 32 mmol/L 25  26  26    Calcium 8.9 - 10.3 mg/dL 9.7  9.1  9.3   Total Protein 6.5 - 8.1 g/dL   7.8   Total Bilirubin 0.3 - 1.2 mg/dL   0.6   Alkaline Phos 38 - 126 U/L   120   AST 15 - 41 U/L   47   ALT 0 - 44 U/L   18         Latest Ref Rng & Units 06/14/2022    4:30 AM 06/13/2022    5:26 AM 06/12/2022    4:06 PM  CBC  WBC 4.0 - 10.5 K/uL 8.7  8.2  6.9   Hemoglobin 12.0 - 15.0 g/dL 12.2  13.5  16.3   Hematocrit 36.0 - 46.0 % 39.8  42.7  51.5   Platelets 150 - 400 K/uL 266  289  344     ABG    Component Value Date/Time   PHART 7.3 (L) 06/14/2022 0415   PCO2ART 57 (H) 06/14/2022 0415   PO2ART 64 (L) 06/14/2022 0415   HCO3 28.0 06/14/2022 0415   TCO2 32 06/12/2022 1602   ACIDBASEDEF 2.4 (H) 06/12/2022 1606   O2SAT 94.6 06/14/2022 0415    CBG (last 3)  Recent Labs    06/14/22 0444 06/14/22 0832 06/14/22 1125  GLUCAP 148* 145* 118*   Critical care time: 37 minutes  Chesley Mires, MD [place] Pager - [phone] or [phone] 06/14/2022, 1:41 PM

## 2022-06-14 NOTE — Progress Notes (Addendum)
PROGRESS NOTE    Carla PEGUES  ZOX:[id number] DOB: 08-Sep-1945 DOA: 06/12/2022 PCP: Carla [name], [name]   Brief Narrative:    Carla [name] is a 77 y.o. female with medical history significant for emphysema, tobacco abuse, peripheral vascular disease, hypertension, prediabetes, anxiety. Carla [name] was brought to the ED from home via EMS with reports of unresponsiveness.  EMS was called to the Carla [name]'s home because Carla [name] had not been eating for the past 3 days, this morning she was altered.  While in EMS transport she became unresponsive and was emergently intubated in the ED.  She was admitted with acute hypercapnic encephalopathy in the setting of COPD exacerbation.  Pulmonology evaluation ongoing.  Carla [name] having difficult time with ventilator weaning and may need to transfer to [facility] for further PCCM evaluation.  Assessment & Plan:   Principal Problem:   Acute respiratory failure with hypoxia and hypercapnia (HCC) Active Problems:   COPD with acute exacerbation (HCC)   Hyperglycemia   Acute metabolic encephalopathy   HTN (hypertension)   Tobacco abuse   Anxiety   Peripheral vascular disease (HCC)   Pulmonary nodules   Dementia (HCC)  Assessment and Plan:  Acute respiratory failure with hypoxia and hypercapnia (HCC) Unresponsive.  VBG obtained showed pH of 7.09, pCO2 of 106.  Likely due to COPD exacerbation.  No lower extremity swelling, BNP 467.  CT angio negative for PE, suggest atelectasis.  COVID, influenza, RSV negative. -Mechanically intubated and sedated -Continue Solu-Medrol, doxycycline, and scheduled DuoNebs -IV PPI daily -Appreciate pulmonology evaluation for ventilator weaning, may need to transfer to [facility] for PCCM evaluation.     Acute hypercapnic encephalopathy With baseline Dementia.  Presented with unresponsiveness.  Head CT negative for acute abnormality.  Likely due to hypercarbic respiratory failure.  UA with many bacteria but negative for  nitrites and leukocytes. WBC 6.9.  Afebrile.  No focus of infection identified at this time. -Mechanical ventilation -ABG -Currently on tube feedings -Blood cultures with no growth in 2 days -Urine culture pending -IV doxycycline for COPD exacerbation   Hyperglycemia-resolved Blood glucose 327. Not in DKA, anion gap of 14, serum bicarb of 26. - HgbA1c pending - SSI- S -Monitor glucose while on steroids   COPD with acute exacerbation (HCC) Ongoing tobacco abuse.  Currently intubated and sedated. -125 mg Solu-Medrol given, continue 60 twice daily -IV doxycycline 100 twice daily ( QTc- 490) -DuoNebs as needed and scheduled -Monitor glucose closely     Pulmonary nodules Incidental finding on CT 06/12/2022 - 2 mm and 4 mm noncalcified left upper lobe lung nodules. No follow-up needed if Carla [name] is low-risk (and has no known or suspected primary neoplasm). Non-contrast chest CT can be considered in 12 months if Carla [name] is high-risk.  -Follow-up as outpatient   HTN (hypertension) Blood pressure fluctuates widely from 109-197. -Monitor for now -Appears she is only on Norvasc    DVT prophylaxis:Lovenox Code Status: Full Family Communication: Son at bedside 3/22 Disposition Plan:  Status is: Inpatient Remains inpatient appropriate because: Need for IV medications   Consultants:  PCCM  Procedures:  None  Antimicrobials:  Anti-infectives (From admission, onward)    Start     Dose/Rate Route Frequency Ordered Stop   06/12/22 2230  doxycycline (VIBRAMYCIN) 100 mg in sodium chloride 0.9 % 250 mL IVPB        100 mg 125 mL/hr over 120 Minutes Intravenous Every 12 hours 06/12/22 2139         Subjective: Carla [name] seen and  evaluated today with no new acute complaints or concerns. No acute concerns or events noted overnight.  She remains intubated and sedated.  Son is at bedside.  Objective: Vitals:   06/14/22 0410 06/14/22 0500 06/14/22 0520 06/14/22 0600  BP:  (!) 144/53   (!) 133/49  Pulse:  65 60 (!) 59  Resp:  (!) 24 (!) 24 (!) 24  Temp:   (!) 97.5 F (36.4 C)   TempSrc:   Axillary   SpO2: 94% 96% 98% 99%  Weight:  44.3 kg    Height:        Intake/Output Summary (Last 24 hours) at 06/14/2022 0722 Last data filed at 06/14/2022 [id number] Gross per 24 hour  Intake 2261.14 ml  Output 500 ml  Net 1761.14 ml   Filed Weights   06/12/22 2100 06/13/22 0632 06/14/22 0500  Weight: 41.7 kg 40.8 kg 44.3 kg    Examination:  General exam: Appears sedated, thin/frail Respiratory system: Clear to auscultation. Respiratory effort normal.  Intubated on mechanical ventilator. Cardiovascular system: S1 & S2 heard, RRR.  Gastrointestinal system: Abdomen is soft Central nervous system: Sedated Extremities: No edema Skin: No significant lesions noted Psychiatry: Flat affect.    Data Reviewed: I have personally reviewed following labs and imaging studies  CBC: Recent Labs  Lab 06/12/22 1602 06/12/22 1606 06/13/22 0526 06/14/22 0430  WBC  --  6.9 8.2 8.7  NEUTROABS  --  4.7  --   --   HGB 19.4* 16.3* 13.5 12.2  HCT 57.0* 51.5* 42.7 39.8  MCV  --  99.2 98.6 100.5*  PLT  --  344 289 [id number]   Basic Metabolic Panel: Recent Labs  Lab 06/12/22 1602 06/12/22 1606 06/13/22 0526 06/14/22 0430  NA 142 138 143 143  K 4.8 4.3 3.2* 4.6  CL 103 98 109 112*  CO2  --  26 26 25   GLUCOSE 307* 327* 106* 155*  BUN 36* 29* 34* 49*  CREATININE 1.20* 1.23* 1.28* 1.25*  CALCIUM  --  9.3 9.1 9.7  MG  --  2.9*  --  2.4  PHOS  --   --   --  2.7   GFR: Estimated Creatinine Clearance: 26.4 mL/min (A) (by C-G formula based on SCr of 1.25 mg/dL (H)). Liver Function Tests: Recent Labs  Lab 06/12/22 1606  AST 47*  ALT 18  ALKPHOS 120  BILITOT 0.6  PROT 7.8  ALBUMIN 3.6   No results for input(s): "LIPASE", "AMYLASE" in the last 168 hours. No results for input(s): "AMMONIA" in the last 168 hours. Coagulation Profile: No results for input(s): "INR", "PROTIME" in the  last 168 hours. Cardiac Enzymes: No results for input(s): "CKTOTAL", "CKMB", "CKMBINDEX", "TROPONINI" in the last 168 hours. BNP (last 3 results) No results for input(s): "PROBNP" in the last 8760 hours. HbA1C: Recent Labs    06/12/22 1831  HGBA1C 6.0*   CBG: Recent Labs  Lab 06/13/22 1127 06/13/22 1620 06/13/22 2011 06/13/22 2338 06/14/22 0444  GLUCAP 131* 102* 138* 117* 148*   Lipid Profile: No results for input(s): "CHOL", "HDL", "LDLCALC", "TRIG", "CHOLHDL", "LDLDIRECT" in the last 72 hours. Thyroid Function Tests: No results for input(s): "TSH", "T4TOTAL", "FREET4", "T3FREE", "THYROIDAB" in the last 72 hours. Anemia Panel: No results for input(s): "VITAMINB12", "FOLATE", "FERRITIN", "TIBC", "IRON", "RETICCTPCT" in the last 72 hours. Sepsis Labs: No results for input(s): "PROCALCITON", "LATICACIDVEN" in the last 168 hours.  Recent Results (from the past 240 hour(s))  Resp panel by RT-PCR (RSV, Flu A&B,  Covid) Anterior Nasal Swab     Status: None   Collection Time: 06/12/22  3:53 PM   Specimen: Anterior Nasal Swab  Result Value Ref Range Status   SARS Coronavirus 2 by RT PCR NEGATIVE NEGATIVE Final    Comment: (NOTE) SARS-CoV-2 target nucleic acids are NOT DETECTED.  The SARS-CoV-2 RNA is generally detectable in upper respiratory specimens during the acute phase of infection. The lowest concentration of SARS-CoV-2 viral copies this assay can detect is 138 copies/mL. A negative result does not preclude SARS-Cov-2 infection and should not be used as the sole basis for treatment or other Carla [name] management decisions. A negative result may occur with  improper specimen collection/handling, submission of specimen other than nasopharyngeal swab, presence of viral mutation(s) within the areas targeted by this assay, and inadequate number of viral copies(<138 copies/mL). A negative result must be combined with clinical observations, Carla [name] history, and  epidemiological information. The expected result is Negative.  Fact Sheet for Patients:  EntrepreneurPulse.com.au  Fact Sheet for Healthcare Providers:  IncredibleEmployment.be  This test is no t yet approved or cleared by the [place] FDA and  has been authorized for detection and/or diagnosis of SARS-CoV-2 by FDA under an Emergency Use Authorization (EUA). This EUA will remain  in effect (meaning this test can be used) for the duration of the COVID-19 declaration under Section 564(b)(1) of the Act, 21 U.S.C.section 360bbb-3(b)(1), unless the authorization is terminated  or revoked sooner.       Influenza A by PCR NEGATIVE NEGATIVE Final   Influenza B by PCR NEGATIVE NEGATIVE Final    Comment: (NOTE) The Xpert Xpress SARS-CoV-2/FLU/RSV plus assay is intended as an aid in the diagnosis of influenza from Nasopharyngeal swab specimens and should not be used as a sole basis for treatment. Nasal washings and aspirates are unacceptable for Xpert Xpress SARS-CoV-2/FLU/RSV testing.  Fact Sheet for Patients: EntrepreneurPulse.com.au  Fact Sheet for Healthcare Providers: IncredibleEmployment.be  This test is not yet approved or cleared by the [place] FDA and has been authorized for detection and/or diagnosis of SARS-CoV-2 by FDA under an Emergency Use Authorization (EUA). This EUA will remain in effect (meaning this test can be used) for the duration of the COVID-19 declaration under Section 564(b)(1) of the Act, 21 U.S.C. section 360bbb-3(b)(1), unless the authorization is terminated or revoked.     Resp Syncytial Virus by PCR NEGATIVE NEGATIVE Final    Comment: (NOTE) Fact Sheet for Patients: EntrepreneurPulse.com.au  Fact Sheet for Healthcare Providers: IncredibleEmployment.be  This test is not yet approved or cleared by the [place] FDA and has been  authorized for detection and/or diagnosis of SARS-CoV-2 by FDA under an Emergency Use Authorization (EUA). This EUA will remain in effect (meaning this test can be used) for the duration of the COVID-19 declaration under Section 564(b)(1) of the Act, 21 U.S.C. section 360bbb-3(b)(1), unless the authorization is terminated or revoked.  Performed at [facility], [address], [place], [place] [address]   Blood Culture (routine x 2)     Status: None (Preliminary result)   Collection Time: 06/12/22  6:10 PM   Specimen: BLOOD RIGHT ARM  Result Value Ref Range Status   Specimen Description   Final    BLOOD RIGHT ARM Performed at [facility], [place] [place], [place] [address]    Special Requests   Final    BOTTLES DRAWN AEROBIC AND ANAEROBIC Blood Culture adequate volume Performed at [facility], [address], [place],  [place] [address]    Culture  Setup Time   Final    NO ORGANISMS SEEN ANAEROBIC BOTTLE ONLY Performed at [facility], [place] [address], [place], [place] [address]    Culture   Final    NO GROWTH < 24 HOURS Performed at [facility], [address], [place], [place] [address]    Report Status PENDING  Incomplete  Blood Culture (routine x 2)     Status: None (Preliminary result)   Collection Time: 06/12/22  6:31 PM   Specimen: BLOOD  Result Value Ref Range Status   Specimen Description BLOOD BLOOD RIGHT ARM  Final   Special Requests   Final    BOTTLES DRAWN AEROBIC AND ANAEROBIC Blood Culture adequate volume   Culture   Final    NO GROWTH < 24 HOURS Performed at [facility], [address] [place], [place] [address]    Report Status PENDING  Incomplete  MRSA Next Gen by PCR, Nasal     Status: None   Collection Time: 06/12/22  9:01 PM   Specimen: Nasal Mucosa; Nasal Swab  Result Value Ref Range Status   MRSA by PCR Next Gen NOT DETECTED NOT DETECTED Final    Comment: (NOTE) The GeneXpert MRSA Assay (FDA approved for NASAL specimens  only), is one component of a comprehensive MRSA colonization surveillance program. It is not intended to diagnose MRSA infection nor to guide or monitor treatment for MRSA infections. Test performance is not FDA approved in patients less than 2 years old. Performed at [facility], [address], [place], [place] [address]          Radiology Studies: DG CHEST PORT 1 VIEW  Result Date: 06/14/2022 CLINICAL DATA:  [id number].  Confirm NGT positioning. EXAM: PORTABLE CHEST 1 VIEW COMPARISON:  Portable chest yesterday at 4:42 a.m. FINDINGS: 4:55 a.m. ETT tip is 5.2 cm from the carina. NGT tip previously was in the mid body of stomach but the full intragastric course is not filmed today. It does again enter the stomach. The heart size and vascular pattern are normal. There is aortic calcification with normal mediastinal configuration. The lungs are mildly emphysematous with chronic interstitial changes without evidence of focal infiltrate. The sulci are sharp. Osteopenia. Overall aeration is unchanged.  No new abnormality. IMPRESSION: 1. NGT full intragastric course is not filmed today. 2. ETT tip is 5.2 cm from the carina. 3. No acute chest findings.  Stable COPD chest. 4. Aortic atherosclerosis. Electronically Signed   By: Telford Nab M.D.   On: 06/14/2022 06:34   Portable Chest xray  Result Date: 06/13/2022 CLINICAL DATA:  [id number].  Ventilator dependent respiratory failure. EXAM: PORTABLE CHEST 1 VIEW COMPARISON:  Portable chest yesterday at 4:07 p.m. CTA chest 06/12/2022. FINDINGS: 4:42 a.m. The lungs are emphysematous with mild coarse chronic interstitial changes, diffuse bronchial thickening. No focal consolidation is seen and no pleural effusion. There is aortic tortuosity and calcification with stable mediastinum. The cardiac size is normal. There is osteopenia with thoracic spondylosis. IMPRESSION: No acute chest findings. Emphysema and chronic interstitial changes. Aortic atherosclerosis. No  significant change from yesterday's exam. Electronically Signed   By: Telford Nab M.D.   On: 06/13/2022 06:35   CT Angio Chest PE W and/or Wo Contrast  Result Date: 06/12/2022 CLINICAL DATA:  Altered mental status, unresponsive. EXAM: CT ANGIOGRAPHY CHEST WITH CONTRAST TECHNIQUE: Multidetector CT imaging of the chest was performed using the standard protocol during bolus administration of intravenous contrast. Multiplanar CT  image reconstructions and MIPs were obtained to evaluate the vascular anatomy. RADIATION DOSE REDUCTION: This exam was performed according to the departmental dose-optimization program which includes automated exposure control, adjustment of the mA and/or kV according to Carla [name] size and/or use of iterative reconstruction technique. CONTRAST:  52mL OMNIPAQUE IOHEXOL 350 MG/ML SOLN COMPARISON:  April 30, 2019 FINDINGS: Cardiovascular: There is marked severity calcification of the thoracic aorta. The ascending thoracic aorta measures 3.5 cm in diameter. Satisfactory opacification of the pulmonary arteries to the segmental level. No evidence of pulmonary embolism. Normal heart size with marked severity coronary artery calcification. Moderate severity left ventricular hypertrophy is also seen. No pericardial effusion. Mediastinum/Nodes: A properly placed endotracheal and nasogastric tubes are seen. No enlarged mediastinal, hilar, or axillary lymph nodes. Thyroid gland, trachea, and esophagus demonstrate no significant findings. Lungs/Pleura: Mild centrilobular emphysematous lung disease. A 2 mm noncalcified lung nodule versus focal scar seen within the lateral aspect of the left upper lobe (axial CT image 41, CT series 13). A 4 mm noncalcified posterior left upper lobe lung nodule is also seen (axial CT image 73, CT series 13). There is mild anteromedial right middle lobe and posterior bibasilar atelectasis. There is no evidence of a pleural effusion or pneumothorax. Upper Abdomen: No acute  abnormality. Musculoskeletal: Multilevel degenerative changes are seen throughout the thoracic spine. Review of the MIP images confirms the above findings. IMPRESSION: 1. No evidence of pulmonary embolism. 2. Mild anteromedial right middle lobe and posterior bibasilar atelectasis. 3. 2 mm and 4 mm noncalcified left upper lobe lung nodules. No follow-up needed if Carla [name] is low-risk (and has no known or suspected primary neoplasm). Non-contrast chest CT can be considered in 12 months if Carla [name] is high-risk. This recommendation follows the consensus statement: Guidelines for Management of Incidental Pulmonary Nodules Detected on CT Images: From the Fleischner Society 2017; Radiology 2017; 284:228-243. 4. Aortic atherosclerosis. Aortic Atherosclerosis (ICD10-I70.0). Electronically Signed   By: Virgina Norfolk M.D.   On: 06/12/2022 17:46   CT Head Wo Contrast  Result Date: 06/12/2022 CLINICAL DATA:  Altered mental status. EXAM: CT HEAD WITHOUT CONTRAST TECHNIQUE: Contiguous axial images were obtained from the base of the skull through the vertex without intravenous contrast. RADIATION DOSE REDUCTION: This exam was performed according to the departmental dose-optimization program which includes automated exposure control, adjustment of the mA and/or kV according to Carla [name] size and/or use of iterative reconstruction technique. COMPARISON:  None Available. FINDINGS: Brain: There is mild cerebral atrophy with widening of the extra-axial spaces and ventricular dilatation. There are areas of decreased attenuation within the white matter tracts of the supratentorial brain, consistent with microvascular disease changes. Vascular: No hyperdense vessel or unexpected calcification. Skull: Normal. Negative for fracture or focal lesion. Sinuses/Orbits: There is marked severity sphenoid sinus mucosal thickening. Other: None. IMPRESSION: 1. No acute intracranial abnormality. 2. Generalized cerebral atrophy with widening of the  extra-axial spaces and ventricular dilatation. 3. Marked severity sphenoid sinus disease. Electronically Signed   By: Virgina Norfolk M.D.   On: 06/12/2022 17:39   DG Chest Portable 1 View  Result Date: 06/12/2022 CLINICAL DATA:  verify placement EXAM: PORTABLE CHEST 1 VIEW COMPARISON:  None Available. FINDINGS: Endotracheal tube tip is approximately 3.2 cm above carina. Gastric tube courses below the diaphragm in outside the field of view. Prominence of the lung markings, probably chronic scarring. No consolidation. Hyperinflation. No visible pleural effusions or pneumothorax. Cardiomediastinal silhouette is within normal limits. Polyarticular degenerative change. IMPRESSION: 1. Endotracheal tube tip is approximately 3.2  cm above carina. 2. Gastric tube courses below the diaphragm in outside the field of view. 3. Hyperinflation, suggestive of emphysema. Electronically Signed   By: Margaretha Sheffield M.D.   On: 06/12/2022 16:20        Scheduled Meds:  arformoterol  15 mcg Nebulization BID   budesonide (PULMICORT) nebulizer solution  0.5 mg Nebulization BID   Chlorhexidine Gluconate Cloth  6 each Topical Daily   docusate  100 mg Per Tube BID   enoxaparin (LOVENOX) injection  30 mg Subcutaneous Q24H   feeding supplement (PROSource TF20)  60 mL Per Tube Daily   feeding supplement (VITAL HIGH PROTEIN)  1,000 mL Per Tube Q24H   insulin aspart  0-9 Units Subcutaneous Q4H   methylPREDNISolone (SOLU-MEDROL) injection  60 mg Intravenous Q12H   mouth rinse  15 mL Mouth Rinse Q2H   pantoprazole (PROTONIX) IV  40 mg Intravenous Daily   polyethylene glycol  17 g Per Tube Daily   revefenacin  175 mcg Nebulization Daily   Continuous Infusions:  sodium chloride 50 mL/hr at 06/14/22 0629   doxycycline (VIBRAMYCIN) IV Stopped (06/13/22 2254)   fentaNYL infusion INTRAVENOUS 75 mcg/hr (06/14/22 0629)     LOS: 2 days    Total critical care time: 35 minutes    Vinh Sachs D Manuella Ghazi, DO [facility]  If 7PM-7AM, please contact night-coverage [email or web address] 06/14/2022, 7:22 AM

## 2022-06-15 DIAGNOSIS — J9602 Acute respiratory failure with hypercapnia: Secondary | ICD-10-CM | POA: Diagnosis not present

## 2022-06-15 DIAGNOSIS — J9601 Acute respiratory failure with hypoxia: Secondary | ICD-10-CM | POA: Diagnosis not present

## 2022-06-15 LAB — GLUCOSE, CAPILLARY
Glucose-Capillary: 147 mg/dL — ABNORMAL HIGH (ref 70–99)
Glucose-Capillary: 141 mg/dL — ABNORMAL HIGH (ref 70–99)
Glucose-Capillary: 171 mg/dL — ABNORMAL HIGH (ref 70–99)
Glucose-Capillary: 180 mg/dL — ABNORMAL HIGH (ref 70–99)
Glucose-Capillary: 121 mg/dL — ABNORMAL HIGH (ref 70–99)
Glucose-Capillary: 127 mg/dL — ABNORMAL HIGH (ref 70–99)

## 2022-06-15 LAB — BASIC METABOLIC PANEL
Anion gap: 7 (ref 5–15)
Anion gap: 8 (ref 5–15)
BUN: 51 mg/dL — ABNORMAL HIGH (ref 8–23)
BUN: 52 mg/dL — ABNORMAL HIGH (ref 8–23)
CO2: 26 mmol/L (ref 22–32)
CO2: 24 mmol/L (ref 22–32)
Calcium: 10.1 mg/dL (ref 8.9–10.3)
Calcium: 10.4 mg/dL — ABNORMAL HIGH (ref 8.9–10.3)
Chloride: 113 mmol/L — ABNORMAL HIGH (ref 98–111)
Chloride: 114 mmol/L — ABNORMAL HIGH (ref 98–111)
Creatinine, Ser: 1.12 mg/dL — ABNORMAL HIGH (ref 0.44–1.00)
Creatinine, Ser: 1.11 mg/dL — ABNORMAL HIGH (ref 0.44–1.00)
GFR, Estimated: 51 mL/min — ABNORMAL LOW (ref >60)
GFR, Estimated: 51 mL/min — ABNORMAL LOW (ref >60)
Glucose, Bld: 150 mg/dL — ABNORMAL HIGH (ref 70–99)
Glucose, Bld: 182 mg/dL — ABNORMAL HIGH (ref 70–99)
Potassium: 4.8 mmol/L (ref 3.5–5.1)
Potassium: 4.8 mmol/L (ref 3.5–5.1)
Sodium: 146 mmol/L — ABNORMAL HIGH (ref 135–145)
Sodium: 146 mmol/L — ABNORMAL HIGH (ref 135–145)

## 2022-06-15 LAB — CBC
HCT: 41 % (ref 36.0–46.0)
Hemoglobin: 12.5 g/dL (ref 12.0–15.0)
MCH: 30.8 pg (ref 26.0–34.0)
MCHC: 30.5 g/dL (ref 30.0–36.0)
MCV: 101 fL — ABNORMAL HIGH (ref 80.0–100.0)
Platelets: 258 10*3/uL (ref 150–400)
RBC: 4.06 MIL/uL (ref 3.87–5.11)
RDW: 13.9 % (ref 11.5–15.5)
WBC: 13.2 10*3/uL — ABNORMAL HIGH (ref 4.0–10.5)
nRBC: 0 % (ref 0.0–0.2)

## 2022-06-15 LAB — URINE CULTURE: Culture: >=100000 — AB

## 2022-06-15 LAB — MAGNESIUM: Magnesium: 2.3 mg/dL (ref 1.7–2.4)

## 2022-06-15 MED ORDER — AMLODIPINE BESYLATE 5 MG PO TABS
5.0000 mg | ORAL_TABLET | Freq: Every day | ORAL | Status: DC
  Administered 2022-06-15 – 2022-06-17 (×3): 5 mg
  Filled 2022-06-15 (×3): qty 1

## 2022-06-15 MED ORDER — LABETALOL HCL 5 MG/ML IV SOLN
10.0000 mg | INTRAVENOUS | Status: DC | PRN
  Administered 2022-06-15 – 2022-06-16 (×2): 10 mg via INTRAVENOUS
  Filled 2022-06-15 (×2): qty 4

## 2022-06-15 MED ORDER — FENTANYL BOLUS VIA INFUSION
25.0000 ug | INTRAVENOUS | Status: DC | PRN
  Administered 2022-06-15 (×2): 25 ug via INTRAVENOUS
  Administered 2022-06-16 (×2): 50 ug via INTRAVENOUS

## 2022-06-15 NOTE — Progress Notes (Addendum)
NAME:  Carla [name], MRN:  [id number], DOB:  11-30-1945, LOS: 3 ADMISSION DATE:  06/12/2022, CONSULTATION DATE:  06/13/2022 REFERRING MD:  Dr. Sherryll Burger, [facility], CHIEF COMPLAINT:  short of breath   History of Present Illness:  77 yo female smoker with hx of COPD from emphysema found by family unresponsive and brought to ER.  Found to have acute on chronic hypercapnia on ABG.  Intubated in ER.  She was started on therapy for COPD exacerbation.  PCCM consulted to assist with respiratory management.  Pertinent  Medical History  Hypertension, Dementia, Anxiety, Depression, GERD, Primary hyperparathyroidism, Vit D deficiency, HLD, Osteoporosis  Significant Hospital Events: Including procedures, antibiotic start and stop dates in addition to other pertinent events   3/20 Admit, intubated  Studies:  CT angio chest 06/12/22 >> atherosclerosis, severe coronary calcification, severe LVH, mild centrilobular emphysema, 2 mm nodule LUL, 4 mm nodule LUL Blood culture 06/12/22 >> Haemophilus influenzae Echo 06/14/22 >> EF 60 to 65%, mod LVH, grade 1 DD, mild/mod AR  Interim History / Subjective:  Hypoxia with SBT.  Objective   Blood pressure (!) 162/65, pulse 70, temperature (!) 97.3 F (36.3 C), temperature source Axillary, resp. rate (!) 26, height 5' (1.524 m), weight 45 kg, SpO2 91 %.    Vent Mode: PRVC FiO2 (%):  [40 %-55 %] 40 % Set Rate:  [24 bmp-26 bmp] 26 bmp Vt Set:  [360 mL] 360 mL PEEP:  [5 cmH20] 5 cmH20 Plateau Pressure:  [14 cmH20-18 cmH20] 16 cmH20   Intake/Output Summary (Last 24 hours) at 06/15/2022 1055 Last data filed at 06/15/2022 0900 Gross per 24 hour  Intake 1408.13 ml  Output 620 ml  Net 788.13 ml   Filed Weights   06/14/22 0500 06/14/22 1924 06/15/22 0701  Weight: 44.3 kg 45 kg 45 kg    Examination:  General - sedated Eyes - pupils reactive ENT - ETT in place Cardiac - regular rate/rhythm, no murmur Chest - scattered rhonchi Abdomen - soft, non tender, + bowel  sounds Extremities - no cyanosis, clubbing, or edema Skin - no rashes Neuro - RASS -1, moves extremities  Resolved Hospital Problem list     Assessment & Plan:   Acute on chronic hypoxia/hypercapnic respiratory failure from COPD exacerbation. COPD with emphysema. Tobacco abuse. - slow to wean; respiratory secretions and mental status will be barriers - goal SpO2 > 90% - wean off prednisone as tolerated - continue yupelri, brovana, pulmicort - prn albuterol - f/u CXR intermittently  Haemophilus influenzae bacteremia. - day 4 of Abx, currently of rocephin  Elevated troponin likely from demand ischemia. Coronary calcification on CT chest. - preserved EF on Echo - f/u with cardiology as outpt  Lung nodules. - f/u imaging as outpt  Acute metabolic encephalopathy from hypercapnia. Hx of dementia, anxiety, depression. - RASS goal 0 to -1  Best Practice (right click and "Reselect all SmartList Selections" daily)   Diet/type: tubefeeds DVT prophylaxis: LMWH GI prophylaxis: PPI Lines: N/A Foley:  N/A Code Status:  full code Last date of multidisciplinary goals of care discussion [updated pt's son at bedside]  Labs       Latest Ref Rng & Units 06/15/2022    1:25 AM 06/14/2022    8:24 PM 06/14/2022    4:30 AM  CMP  Glucose 70 - 99 mg/dL 409   811   BUN 8 - 23 mg/dL 51   49   Creatinine 9.14 - 1.00 mg/dL 7.82   9.56   Sodium  135 - 145 mmol/L 146  148  143   Potassium 3.5 - 5.1 mmol/L 4.8  4.7  4.6   Chloride 98 - 111 mmol/L 113   112   CO2 22 - 32 mmol/L 26   25   Calcium 8.9 - 10.3 mg/dL 10.1   9.7        Latest Ref Rng & Units 06/15/2022    1:25 AM 06/14/2022    8:24 PM 06/14/2022    4:30 AM  CBC  WBC 4.0 - 10.5 K/uL 13.2   8.7   Hemoglobin 12.0 - 15.0 g/dL 12.5  12.2  12.2   Hematocrit 36.0 - 46.0 % 41.0  36.0  39.8   Platelets 150 - 400 K/uL 258   266     ABG    Component Value Date/Time   PHART 7.324 (L) 06/14/2022 2024   PCO2ART 54.2 (H) 06/14/2022  2024   PO2ART 66 (L) 06/14/2022 2024   HCO3 28.4 (H) 06/14/2022 2024   TCO2 30 06/14/2022 2024   ACIDBASEDEF 2.4 (H) 06/12/2022 1606   O2SAT 91 06/14/2022 2024    CBG (last 3)  Recent Labs    06/14/22 2307 06/15/22 0312 06/15/22 0745  GLUCAP 86 147* 141*   Critical care time: 33 minutes  Chesley Mires, MD [facility] Pager - [phone] or [phone] 06/15/2022, 10:55 AM

## 2022-06-16 DIAGNOSIS — J9602 Acute respiratory failure with hypercapnia: Secondary | ICD-10-CM | POA: Diagnosis not present

## 2022-06-16 DIAGNOSIS — J9601 Acute respiratory failure with hypoxia: Secondary | ICD-10-CM | POA: Diagnosis not present

## 2022-06-16 LAB — GLUCOSE, CAPILLARY
Glucose-Capillary: 147 mg/dL — ABNORMAL HIGH (ref 70–99)
Glucose-Capillary: 132 mg/dL — ABNORMAL HIGH (ref 70–99)
Glucose-Capillary: 167 mg/dL — ABNORMAL HIGH (ref 70–99)
Glucose-Capillary: 164 mg/dL — ABNORMAL HIGH (ref 70–99)
Glucose-Capillary: 176 mg/dL — ABNORMAL HIGH (ref 70–99)
Glucose-Capillary: 167 mg/dL — ABNORMAL HIGH (ref 70–99)

## 2022-06-16 LAB — BLOOD GAS, VENOUS
Acid-Base Excess: 6.5 mmol/L — ABNORMAL HIGH (ref 0.0–2.0)
Bicarbonate: 33.9 mmol/L — ABNORMAL HIGH (ref 20.0–28.0)
O2 Saturation: 96 %
Patient temperature: 36.8
pCO2, Ven: 59 mmHg (ref 44–60)
pH, Ven: 7.36 (ref 7.25–7.43)
pO2, Ven: 64 mmHg — ABNORMAL HIGH (ref 32–45)

## 2022-06-16 MED ORDER — DEXMEDETOMIDINE HCL IN NACL 400 MCG/100ML IV SOLN
0.0000 ug/kg/h | INTRAVENOUS | Status: DC
  Administered 2022-06-16: 0.4 ug/kg/h via INTRAVENOUS
  Administered 2022-06-16 – 2022-06-17 (×2): 1.2 ug/kg/h via INTRAVENOUS
  Administered 2022-06-17: 0.7 ug/kg/h via INTRAVENOUS
  Administered 2022-06-17: 1.2 ug/kg/h via INTRAVENOUS
  Administered 2022-06-18: 0.7 ug/kg/h via INTRAVENOUS
  Administered 2022-06-18 – 2022-06-20 (×6): 1 ug/kg/h via INTRAVENOUS
  Administered 2022-06-21 – 2022-06-25 (×14): 1.2 ug/kg/h via INTRAVENOUS
  Administered 2022-06-25: 0.9 ug/kg/h via INTRAVENOUS
  Administered 2022-06-26 – 2022-06-27 (×6): 1.2 ug/kg/h via INTRAVENOUS
  Administered 2022-06-28: 0.8 ug/kg/h via INTRAVENOUS
  Administered 2022-06-28: 1.2 ug/kg/h via INTRAVENOUS
  Administered 2022-06-29: 0.5 ug/kg/h via INTRAVENOUS
  Administered 2022-06-29 – 2022-07-01 (×3): 0.7 ug/kg/h via INTRAVENOUS
  Filled 2022-06-16 (×41): qty 100

## 2022-06-16 MED ORDER — FREE WATER
100.0000 mL | Status: DC
  Administered 2022-06-16 – 2022-06-18 (×12): 100 mL

## 2022-06-16 MED ORDER — FENTANYL CITRATE PF 50 MCG/ML IJ SOSY
25.0000 ug | PREFILLED_SYRINGE | INTRAMUSCULAR | Status: DC | PRN
  Administered 2022-06-16: 50 ug via INTRAVENOUS
  Administered 2022-06-16: 100 ug via INTRAVENOUS
  Administered 2022-06-16 (×2): 50 ug via INTRAVENOUS
  Administered 2022-06-17: 100 ug via INTRAVENOUS
  Administered 2022-06-17 (×3): 50 ug via INTRAVENOUS
  Filled 2022-06-16: qty 2
  Filled 2022-06-16: qty 1
  Filled 2022-06-16: qty 2
  Filled 2022-06-16 (×2): qty 1
  Filled 2022-06-16: qty 2

## 2022-06-16 NOTE — Progress Notes (Addendum)
NAME:  Carla [name], MRN:  [id number], DOB:  Feb 09, 1946, LOS: 4 ADMISSION DATE:  06/12/2022, CONSULTATION DATE:  06/13/2022 REFERRING MD:  Dr. Manuella Ghazi, [facility], CHIEF COMPLAINT:  short of breath   History of Present Illness:  77 yo female smoker with hx of COPD from emphysema found by family unresponsive and brought to ER.  Found to have acute on chronic hypercapnia on ABG.  Intubated in ER.  She was started on therapy for COPD exacerbation.  PCCM consulted to assist with respiratory management.  Pertinent  Medical History  Hypertension, Dementia, Anxiety, Depression, GERD, Primary hyperparathyroidism, Vit D deficiency, HLD, Osteoporosis  Significant Hospital Events: Including procedures, antibiotic start and stop dates in addition to other pertinent events   3/20 Admit, intubated  Studies:  CT angio chest 06/12/22 >> atherosclerosis, severe coronary calcification, severe LVH, mild centrilobular emphysema, 2 mm nodule LUL, 4 mm nodule LUL Blood culture 06/12/22 >> Haemophilus influenzae Echo 06/14/22 >> EF 60 to 65%, mod LVH, grade 1 DD, mild/mod AR  Interim History / Subjective:  Ongoing thick secretions  Objective   Blood pressure (!) 167/91, pulse 85, temperature 97.8 F (36.6 C), temperature source Axillary, resp. rate (!) 26, height 5' (1.524 m), weight 43.6 kg, SpO2 91 %.    Vent Mode: PRVC FiO2 (%):  [40 %-50 %] 50 % Set Rate:  [26 bmp] 26 bmp Vt Set:  [360 mL] 360 mL PEEP:  [5 cmH20] 5 cmH20 Plateau Pressure:  [14 cmH20-22 cmH20] 14 cmH20   Intake/Output Summary (Last 24 hours) at 06/16/2022 0946 Last data filed at 06/16/2022 0800 Gross per 24 hour  Intake 1817.36 ml  Output 550 ml  Net 1267.36 ml   Filed Weights   06/14/22 1924 06/15/22 0701 06/16/22 0500  Weight: 45 kg 45 kg 43.6 kg    Examination: Fentanyl 50 General:  older female lying in bed in NAD HEENT: MM pink/moist, squints eyes, pupils 2/reactive, ETT/ OGT Neuro: intermittently will f/c in all  extremities, will open eyes/ grimace CV: rr, NSR, no murmur PULM:  MV supported, breathing over set rate, some air trapping, flipped to PSV 10/5 and doing well thus far, lung clear- no wheeze, no secretions for me but thick yellow/tan noted in ballard  GI: soft, bs+, ND, foley Extremities: warm/dry, no LE edema  Skin: no rashes   Labs reviewed UOP 627ml/hr Net +6.6L Afebrile   Resolved Hospital Problem list     Assessment & Plan:   Acute on chronic hypoxia/hypercapnic respiratory failure from COPD exacerbation. COPD with emphysema. Tobacco abuse. - doing well thus far on SBT this am.  Stop fent gtt, change to precedex gtt and prn fentanyl with bowel regimen - secretions and mentals status may remain a barrier to extubation - sat goal > 90% - cont prednisone taper - ongoing BD> yupelri, brovana, pulmicort, prn albuterol - cont abx as below  - intermittent CXR - smoking cessation counseling when appropriate - PT/ OT  Haemophilus influenzae bacteremia. - day 5 of Abx, currently of rocephin - trend WBC/ fever curve  Elevated troponin likely from demand ischemia. Coronary calcification on CT chest. - preserved EF on Echo - f/u with cardiology as outpt  Lung nodules. - f/u imaging as outpt  Acute metabolic encephalopathy from hypercapnia. Hx of dementia, anxiety, depression. - RASS goal 0 to -1 - consider adding enteral sedation pending response to precedex - supportive care as above   Hypernatremia - add FWF, trend BMET  HTN - continue norvasc, prn  labetalol   Need to verify home meds.   Best Practice (right click and "Reselect all SmartList Selections" daily)   Diet/type: tubefeeds DVT prophylaxis: LMWH GI prophylaxis: PPI Lines: N/A Foley:  Yes, and it is still needed> given restlessness, purwick will not work Code Status:  full code Last date of multidisciplinary goals of care discussion [updated pt's son at bedside]  Pending update at bedside.  Sone  stepped out.    Labs       Latest Ref Rng & Units 06/15/2022   11:23 AM 06/15/2022    1:25 AM 06/14/2022    8:24 PM  CMP  Glucose 70 - 99 mg/dL 182  150    BUN 8 - 23 mg/dL 52  51    Creatinine 0.44 - 1.00 mg/dL 1.11  1.12    Sodium 135 - 145 mmol/L 146  146  148   Potassium 3.5 - 5.1 mmol/L 4.8  4.8  4.7   Chloride 98 - 111 mmol/L 114  113    CO2 22 - 32 mmol/L 24  26    Calcium 8.9 - 10.3 mg/dL 10.4  10.1         Latest Ref Rng & Units 06/15/2022    1:25 AM 06/14/2022    8:24 PM 06/14/2022    4:30 AM  CBC  WBC 4.0 - 10.5 K/uL 13.2   8.7   Hemoglobin 12.0 - 15.0 g/dL 12.5  12.2  12.2   Hematocrit 36.0 - 46.0 % 41.0  36.0  39.8   Platelets 150 - 400 K/uL 258   266     ABG    Component Value Date/Time   PHART 7.324 (L) 06/14/2022 2024   PCO2ART 54.2 (H) 06/14/2022 2024   PO2ART 66 (L) 06/14/2022 2024   HCO3 28.4 (H) 06/14/2022 2024   TCO2 30 06/14/2022 2024   ACIDBASEDEF 2.4 (H) 06/12/2022 1606   O2SAT 91 06/14/2022 2024    CBG (last 3)  Recent Labs    06/15/22 2309 06/16/22 0310 06/16/22 0722  GLUCAP 127* 147* 132*   Critical care time: 34 minutes     Kennieth Rad, MSN, AG-ACNP-BC [facility] 06/16/2022, 9:47 AM  See Amion for pager If no response to pager, please call PCCM consult pager After 7:00 pm call Elink

## 2022-06-17 ENCOUNTER — Inpatient Hospital Stay (HOSPITAL_COMMUNITY): Payer: HMO

## 2022-06-17 DIAGNOSIS — I10 Essential (primary) hypertension: Secondary | ICD-10-CM | POA: Diagnosis not present

## 2022-06-17 DIAGNOSIS — J9602 Acute respiratory failure with hypercapnia: Secondary | ICD-10-CM | POA: Diagnosis not present

## 2022-06-17 DIAGNOSIS — J449 Chronic obstructive pulmonary disease, unspecified: Secondary | ICD-10-CM | POA: Diagnosis not present

## 2022-06-17 DIAGNOSIS — R000 Tachycardia, unspecified: Secondary | ICD-10-CM | POA: Diagnosis not present

## 2022-06-17 DIAGNOSIS — R0682 Tachypnea, not elsewhere classified: Secondary | ICD-10-CM | POA: Diagnosis not present

## 2022-06-17 DIAGNOSIS — J9601 Acute respiratory failure with hypoxia: Secondary | ICD-10-CM | POA: Diagnosis not present

## 2022-06-17 LAB — CULTURE, BLOOD (ROUTINE X 2)
Culture: NO GROWTH
Special Requests: ADEQUATE

## 2022-06-17 LAB — POCT I-STAT 7, (LYTES, BLD GAS, ICA,H+H)
Acid-Base Excess: 6 mmol/L — ABNORMAL HIGH (ref 0.0–2.0)
Bicarbonate: 35.7 mmol/L — ABNORMAL HIGH (ref 20.0–28.0)
Calcium, Ion: 1.45 mmol/L — ABNORMAL HIGH (ref 1.15–1.40)
HCT: 46 % (ref 36.0–46.0)
Hemoglobin: 15.6 g/dL — ABNORMAL HIGH (ref 12.0–15.0)
O2 Saturation: 98 %
Potassium: 4.6 mmol/L (ref 3.5–5.1)
Sodium: 145 mmol/L (ref 135–145)
TCO2: 38 mmol/L — ABNORMAL HIGH (ref 22–32)
pCO2 arterial: 77.7 mmHg (ref 32–48)
pH, Arterial: 7.271 — ABNORMAL LOW (ref 7.35–7.45)
pO2, Arterial: 131 mmHg — ABNORMAL HIGH (ref 83–108)

## 2022-06-17 LAB — GLUCOSE, CAPILLARY
Glucose-Capillary: 125 mg/dL — ABNORMAL HIGH (ref 70–99)
Glucose-Capillary: 129 mg/dL — ABNORMAL HIGH (ref 70–99)
Glucose-Capillary: 167 mg/dL — ABNORMAL HIGH (ref 70–99)
Glucose-Capillary: 146 mg/dL — ABNORMAL HIGH (ref 70–99)
Glucose-Capillary: 216 mg/dL — ABNORMAL HIGH (ref 70–99)
Glucose-Capillary: 218 mg/dL — ABNORMAL HIGH (ref 70–99)

## 2022-06-17 LAB — RENAL FUNCTION PANEL
Albumin: 2.3 g/dL — ABNORMAL LOW (ref 3.5–5.0)
Anion gap: 9 (ref 5–15)
BUN: 27 mg/dL — ABNORMAL HIGH (ref 8–23)
CO2: 34 mmol/L — ABNORMAL HIGH (ref 22–32)
Calcium: 9.9 mg/dL (ref 8.9–10.3)
Chloride: 104 mmol/L (ref 98–111)
Creatinine, Ser: 0.82 mg/dL (ref 0.44–1.00)
GFR, Estimated: >60 mL/min (ref >60)
Glucose, Bld: 130 mg/dL — ABNORMAL HIGH (ref 70–99)
Phosphorus: 2.6 mg/dL (ref 2.5–4.6)
Potassium: 3.9 mmol/L (ref 3.5–5.1)
Sodium: 147 mmol/L — ABNORMAL HIGH (ref 135–145)

## 2022-06-17 LAB — CBC
HCT: 43.3 % (ref 36.0–46.0)
Hemoglobin: 13.7 g/dL (ref 12.0–15.0)
MCH: 30.6 pg (ref 26.0–34.0)
MCHC: 31.6 g/dL (ref 30.0–36.0)
MCV: 96.9 fL (ref 80.0–100.0)
Platelets: 209 10*3/uL (ref 150–400)
RBC: 4.47 MIL/uL (ref 3.87–5.11)
RDW: 13.8 % (ref 11.5–15.5)
WBC: 12.4 10*3/uL — ABNORMAL HIGH (ref 4.0–10.5)
nRBC: 0 % (ref 0.0–0.2)

## 2022-06-17 MED ORDER — HYDRALAZINE HCL 20 MG/ML IJ SOLN
20.0000 mg | Freq: Four times a day (QID) | INTRAMUSCULAR | Status: DC | PRN
  Administered 2022-06-17 (×2): 20 mg via INTRAVENOUS
  Filled 2022-06-17 (×2): qty 1

## 2022-06-17 MED ORDER — POTASSIUM CHLORIDE 20 MEQ PO PACK
40.0000 meq | PACK | Freq: Once | ORAL | Status: AC
  Administered 2022-06-17: 40 meq
  Filled 2022-06-17: qty 2

## 2022-06-17 MED ORDER — MIDAZOLAM HCL 2 MG/2ML IJ SOLN
2.0000 mg | Freq: Once | INTRAMUSCULAR | Status: AC
  Administered 2022-06-17: 2 mg via INTRAVENOUS

## 2022-06-17 MED ORDER — NOREPINEPHRINE 4 MG/250ML-% IV SOLN
2.0000 ug/min | INTRAVENOUS | Status: DC
  Administered 2022-06-17: 10 ug/min via INTRAVENOUS
  Administered 2022-06-17: 4 ug/min via INTRAVENOUS
  Administered 2022-06-18 – 2022-06-20 (×3): 2 ug/min via INTRAVENOUS
  Administered 2022-06-21: 3 ug/min via INTRAVENOUS
  Administered 2022-06-21 – 2022-06-25 (×2): 2 ug/min via INTRAVENOUS
  Administered 2022-06-27: 1 ug/min via INTRAVENOUS
  Administered 2022-06-29 – 2022-07-01 (×2): 2 ug/min via INTRAVENOUS
  Filled 2022-06-17 (×11): qty 250

## 2022-06-17 MED ORDER — MIDAZOLAM HCL 2 MG/2ML IJ SOLN
INTRAMUSCULAR | Status: AC
  Filled 2022-06-17: qty 2

## 2022-06-17 MED ORDER — FENTANYL 2500MCG IN NS 250ML (10MCG/ML) PREMIX INFUSION
25.0000 ug/h | INTRAVENOUS | Status: DC
  Administered 2022-06-17: 25 ug/h via INTRAVENOUS
  Administered 2022-06-18: 150 ug/h via INTRAVENOUS
  Administered 2022-06-18 – 2022-06-21 (×3): 125 ug/h via INTRAVENOUS
  Administered 2022-06-21 – 2022-06-23 (×4): 200 ug/h via INTRAVENOUS
  Administered 2022-06-23: 150 ug/h via INTRAVENOUS
  Administered 2022-06-24: 200 ug/h via INTRAVENOUS
  Administered 2022-06-24 – 2022-06-25 (×2): 150 ug/h via INTRAVENOUS
  Administered 2022-06-26: 100 ug/h via INTRAVENOUS
  Administered 2022-06-27: 150 ug/h via INTRAVENOUS
  Administered 2022-06-27: 125 ug/h via INTRAVENOUS
  Administered 2022-06-29: 75 ug/h via INTRAVENOUS
  Administered 2022-06-30: 125 ug/h via INTRAVENOUS
  Administered 2022-07-01: 50 ug/h via INTRAVENOUS
  Filled 2022-06-17 (×21): qty 250

## 2022-06-17 MED ORDER — METOLAZONE 5 MG PO TABS
5.0000 mg | ORAL_TABLET | Freq: Once | ORAL | Status: AC
  Administered 2022-06-17: 5 mg
  Filled 2022-06-17: qty 1

## 2022-06-17 MED ORDER — DOCUSATE SODIUM 50 MG/5ML PO LIQD: 100.0000 mg | Freq: Two times a day (BID) | ORAL | Status: DC

## 2022-06-17 MED ORDER — FUROSEMIDE 10 MG/ML IJ SOLN
60.0000 mg | Freq: Once | INTRAMUSCULAR | Status: AC
  Administered 2022-06-17: 60 mg via INTRAVENOUS
  Filled 2022-06-17: qty 6

## 2022-06-17 MED ORDER — FENTANYL BOLUS VIA INFUSION
25.0000 ug | INTRAVENOUS | Status: DC | PRN
  Administered 2022-06-17: 25 ug via INTRAVENOUS
  Administered 2022-06-17: 100 ug via INTRAVENOUS
  Administered 2022-06-17 – 2022-06-20 (×9): 50 ug via INTRAVENOUS
  Administered 2022-06-20: 100 ug via INTRAVENOUS
  Administered 2022-06-20: 50 ug via INTRAVENOUS
  Administered 2022-06-20: 75 ug via INTRAVENOUS
  Administered 2022-06-20 – 2022-06-23 (×22): 100 ug via INTRAVENOUS
  Administered 2022-06-24 (×2): 50 ug via INTRAVENOUS
  Administered 2022-06-24 – 2022-06-27 (×9): 100 ug via INTRAVENOUS
  Administered 2022-06-28: 50 ug via INTRAVENOUS
  Administered 2022-06-28: 100 ug via INTRAVENOUS
  Administered 2022-06-28: 50 ug via INTRAVENOUS
  Administered 2022-06-29: 100 ug via INTRAVENOUS
  Administered 2022-06-29: 75 ug via INTRAVENOUS
  Administered 2022-06-29 – 2022-07-01 (×5): 100 ug via INTRAVENOUS
  Administered 2022-07-02: 50 ug via INTRAVENOUS

## 2022-06-17 MED ORDER — VITAL 1.5 CAL PO LIQD
1000.0000 mL | ORAL | Status: DC
  Administered 2022-06-17 – 2022-07-01 (×12): 1000 mL

## 2022-06-17 MED ORDER — NOREPINEPHRINE 4 MG/250ML-% IV SOLN
INTRAVENOUS | Status: AC
  Filled 2022-06-17: qty 250

## 2022-06-17 MED ORDER — POLYETHYLENE GLYCOL 3350 17 G PO PACK: 17.0000 g | PACK | Freq: Every day | ORAL | Status: DC

## 2022-06-17 MED ORDER — FENTANYL CITRATE PF 50 MCG/ML IJ SOSY
25.0000 ug | PREFILLED_SYRINGE | Freq: Once | INTRAMUSCULAR | Status: AC
  Administered 2022-06-17: 25 ug via INTRAVENOUS

## 2022-06-17 MED ORDER — SODIUM CHLORIDE 0.9 % IV SOLN
250.0000 mL | INTRAVENOUS | Status: DC
  Administered 2022-06-17 – 2022-07-04 (×2): 250 mL via INTRAVENOUS

## 2022-06-17 MED ORDER — MIDAZOLAM HCL 2 MG/2ML IJ SOLN: 4.0000 mg | Freq: Once | INTRAMUSCULAR | Status: DC

## 2022-06-17 MED ORDER — FENTANYL CITRATE PF 50 MCG/ML IJ SOSY: 100.0000 ug | PREFILLED_SYRINGE | Freq: Once | INTRAMUSCULAR | Status: AC

## 2022-06-17 MED ORDER — MIDAZOLAM HCL 2 MG/2ML IJ SOLN
4.0000 mg | Freq: Once | INTRAMUSCULAR | Status: AC
  Administered 2022-06-17: 4 mg via INTRAVENOUS
  Filled 2022-06-17: qty 4

## 2022-06-17 NOTE — Procedures (Addendum)
Bronchoscopy Procedure Note  RHUNETTE MOESSNER  [id number]  Aug 03, 1945  Date:06/17/22  Time:12:15 PM   Provider Performing:Jaicion Laurie M Verlee Monte   Procedure(s):  Flexible Bronchoscopy [phone]) and Initial Therapeutic Aspiration of Tracheobronchial Tree [phone])  Indication(s) Mucus plugging  Consent Unable to obtain consent due to emergent nature of procedure.  Anesthesia See [facility] for details   Time Out Verified patient identification, verified procedure, site/side was marked, verified correct patient position, special equipment/implants available, medications/allergies/relevant history reviewed, required imaging and test results available.   Sterile Technique Usual hand hygiene, masks, gowns, and gloves were used   Procedure Description Bronchoscope advanced through endotracheal tube and into airway.  Airways were examined down to subsegmental level with findings noted below.   Following diagnostic evaluation, cast of mucus aspirated from end of ETT.   Findings:  - thick secretions adherent circumferentially to distal end of ett - cast of mucus suctioned from end of ETT, trachea - hyperemic, edematous mucosa throughout - ETT in acceptable position ~5cm from carina at end of procedure - tidal volumes immediately improved from [id number] to [id number]   Complications/Tolerance None; patient tolerated the procedure well. Chest X-ray is not needed post procedure.   EBL Minimal   Specimen(s) None

## 2022-06-17 NOTE — TOC Progression Note (Addendum)
Transition of Care (TOC) - Initial/Assessment Note    Patient Details  Name: Carla [name] MRN: [id number] Date of Birth: 01/24/1946  Transition of Care (TOC) CM/SW Contact:    Juliya Magill F [name], LCSWA Phone Number: 06/17/2022, 10:45 AM  Clinical Narrative:                 Transition of Care Department (TOC) has reviewed patient.  Patient is intubated at this time.  We will continue to monitor patient advancement through interdisciplinary progression rounds. If new patient transition needs arise, please place a TOC consult.          Patient Goals and CMS Choice            Expected Discharge Plan and Services                                              Prior Living Arrangements/Services                       Activities of Daily Living      Permission Sought/Granted                  Emotional Assessment              Admission diagnosis:  Hyperglycemia [R73.9] Acute respiratory failure with hypercapnia (HCC) [J96.02] Acute respiratory failure with hypoxia and hypercapnia (HCC) [J96.01, J96.02] Glasgow coma scale total score 3-8, in the field (EMT or ambulance) (HCC) [R40.2431] Patient Active Problem List   Diagnosis Date Noted   Acute respiratory failure with hypoxia and hypercapnia (HCC) 06/12/2022   COPD with acute exacerbation (HCC) 06/12/2022   HTN (hypertension) 06/12/2022   Tobacco abuse 06/12/2022   Anxiety 06/12/2022   Peripheral vascular disease (HCC) 06/12/2022   Hyperglycemia 06/12/2022   Pulmonary nodules 06/12/2022   Dementia (HCC) 06/12/2022   Acute metabolic encephalopathy 06/12/2022   PCP:  Patient, [name] Pharmacy:   [facility] - [place], [place] - [address] [place] [address] Phone: [phone] Fax: [phone]     Social Determinants of Health (SDOH) Social History:   SDOH Interventions:     Readmission Risk Interventions     No data to display            

## 2022-06-17 NOTE — Progress Notes (Addendum)
NAME:  Carla [name], MRN:  [id number], DOB:  1945/09/24, LOS: 5 ADMISSION DATE:  06/12/2022, CONSULTATION DATE:  06/13/2022 REFERRING MD:  Dr. Manuella Ghazi, [facility], CHIEF COMPLAINT:  short of breath   History of Present Illness:  77 yo female smoker with hx of COPD from emphysema found by family unresponsive and brought to ER.  Found to have acute on chronic hypercapnia on ABG.  Intubated in ER.  She was started on therapy for COPD exacerbation.  PCCM consulted to assist with respiratory management.  Pertinent  Medical History  Hypertension, Dementia, Anxiety, Depression, GERD, Primary hyperparathyroidism, Vit D deficiency, HLD, Osteoporosis  Significant Hospital Events: Including procedures, antibiotic start and stop dates in addition to other pertinent events   3/20 Admit, intubated  Studies:  CT angio chest 06/12/22 >> atherosclerosis, severe coronary calcification, severe LVH, mild centrilobular emphysema, 2 mm nodule LUL, 4 mm nodule LUL Blood culture 06/12/22 >> Haemophilus influenzae Echo 06/14/22 >> EF 60 to 65%, mod LVH, grade 1 DD, mild/mod AR  Interim History / Subjective:  Thick secretions still, failed SBT again due to tachypnea, increased WOB on high PS.   Objective   Blood pressure (!) 211/82, pulse 99, temperature (!) 97.5 F (36.4 C), temperature source Axillary, resp. rate (!) 39, height 5' (1.524 m), weight 44.5 kg, SpO2 95 %.    Vent Mode: PRVC FiO2 (%):  [40 %-50 %] 40 % Set Rate:  [20 bmp-26 bmp] 20 bmp Vt Set:  [360 mL] 360 mL PEEP:  [5 cmH20] 5 cmH20 Pressure Support:  [10 cmH20] 10 cmH20 Plateau Pressure:  [14 cmH20-26 cmH20] 25 cmH20   Intake/Output Summary (Last 24 hours) at 06/17/2022 0945 Last data filed at 06/17/2022 0600 Gross per 24 hour  Intake 1536.54 ml  Output 900 ml  Net 636.54 ml   Filed Weights   06/15/22 0701 06/16/22 0500 06/17/22 0441  Weight: 45 kg 43.6 kg 44.5 kg    Examination: General appearance: 77 y.o., female, NAD, conversant   Eyes: PERRL, tracks HENT: NCAT; dry MM Neck: Trachea midline; no lymphadenopathy, no JVD Lungs: rhochi/coarse, tachypneic CV: tachy RR, no murmur  Abdomen: Soft, non-tender; non-distended, BS present  Extremities: trace peripheral edema, warm Neuro: grossly nonfocal   Labs reviewed    Resolved Hospital Problem list     Assessment & Plan:   Acute on chronic hypoxia/hypercapnic respiratory failure from COPD exacerbation. COPD with emphysema. Tobacco abuse. - fentanyl gtt and precedex for rass 0 to -1 - secretions and mentals status remain a barrier to extubation - sat goal > 90% - cont prednisone taper - ongoing BD> yupelri, brovana, pulmicort, prn albuterol - cont abx as below  - intermittent CXR - smoking cessation counseling when appropriate - diurese with metolazone and lasix - PT/ OT  Haemophilus influenzae bacteremia. - currently of rocephin - tentative 7d course with ceftriaxone - trend WBC/ fever curve  Elevated troponin likely from demand ischemia. Coronary calcification on CT chest. - preserved EF on Echo - f/u with cardiology as outpt  Lung nodules. - f/u imaging as outpt  Acute metabolic encephalopathy from hypercapnia. Hx of dementia, anxiety, depression. - RASS goal 0 to -1 - consider adding enteral sedation pending response to precedex - supportive care as above   Hypernatremia - add FWF, trend BMET - add metolazone for diuretic  HTN - continue norvasc, prn labetalol   Need to verify home meds.   Best Practice (right click and "Reselect all SmartList Selections" daily)   Diet/type:  tubefeeds DVT prophylaxis: LMWH GI prophylaxis: PPI Lines: N/A Foley:  Yes, and it is still needed> given restlessness, purwick will not work Code Status:  full code Last date of multidisciplinary goals of care discussion [updated pt's son at bedside 3/24]    Labs       Latest Ref Rng & Units 06/17/2022    1:32 AM 06/15/2022   11:23 AM 06/15/2022     1:25 AM  CMP  Glucose 70 - 99 mg/dL 130  182  150   BUN 8 - 23 mg/dL 27  52  51   Creatinine 0.44 - 1.00 mg/dL 0.82  1.11  1.12   Sodium 135 - 145 mmol/L 147  146  146   Potassium 3.5 - 5.1 mmol/L 3.9  4.8  4.8   Chloride 98 - 111 mmol/L 104  114  113   CO2 22 - 32 mmol/L 34  24  26   Calcium 8.9 - 10.3 mg/dL 9.9  10.4  10.1        Latest Ref Rng & Units 06/17/2022    1:32 AM 06/15/2022    1:25 AM 06/14/2022    8:24 PM  CBC  WBC 4.0 - 10.5 K/uL 12.4  13.2    Hemoglobin 12.0 - 15.0 g/dL 13.7  12.5  12.2   Hematocrit 36.0 - 46.0 % 43.3  41.0  36.0   Platelets 150 - 400 K/uL 209  258      ABG    Component Value Date/Time   PHART 7.324 (L) 06/14/2022 2024   PCO2ART 54.2 (H) 06/14/2022 2024   PO2ART 66 (L) 06/14/2022 2024   HCO3 33.9 (H) 06/16/2022 1544   TCO2 30 06/14/2022 2024   ACIDBASEDEF 2.4 (H) 06/12/2022 1606   O2SAT 96 06/16/2022 1544    CBG (last 3)  Recent Labs    06/16/22 2301 06/17/22 0321 06/17/22 0737  GLUCAP 167* 125* 129*   Critical care time: 33 minutes     Fredirick [facility]  06/17/2022, 9:45 AM  See Amion for pager If no response to pager, please call PCCM consult pager After 7:00 pm call Elink

## 2022-06-17 NOTE — TOC Progression Note (Incomplete Revision)
Transition of Care (TOC) - Initial/Assessment Note    Patient Details  Name: Carla [name] MRN: [id number] Date of Birth: 01/24/1946  Transition of Care (TOC) CM/SW Contact:    Carla [name] F Carla [name], LCSWA Phone Number: 06/17/2022, 10:45 AM  Clinical Narrative:                 Transition of Care Department (TOC) has reviewed patient.  Patient is intubated at this time.  We will continue to monitor patient advancement through interdisciplinary progression rounds. If new patient transition needs arise, please place a TOC consult.          Patient Goals and CMS Choice            Expected Discharge Plan and Services                                              Prior Living Arrangements/Services                       Activities of Daily Living      Permission Sought/Granted                  Emotional Assessment              Admission diagnosis:  Hyperglycemia [R73.9] Acute respiratory failure with hypercapnia (HCC) [J96.02] Acute respiratory failure with hypoxia and hypercapnia (HCC) [J96.01, J96.02] Glasgow coma scale total score 3-8, in the field (EMT or ambulance) (HCC) [R40.2431] Patient Active Problem List   Diagnosis Date Noted   Acute respiratory failure with hypoxia and hypercapnia (HCC) 06/12/2022   COPD with acute exacerbation (HCC) 06/12/2022   HTN (hypertension) 06/12/2022   Tobacco abuse 06/12/2022   Anxiety 06/12/2022   Peripheral vascular disease (HCC) 06/12/2022   Hyperglycemia 06/12/2022   Pulmonary nodules 06/12/2022   Dementia (HCC) 06/12/2022   Acute metabolic encephalopathy 06/12/2022   PCP:  Patient, [name] Pharmacy:   [facility] - [place], [place] - [address] [place] [address] Phone: [phone] Fax: [phone]     Social Determinants of Health (SDOH) Social History:   SDOH Interventions:     Readmission Risk Interventions     No data to display            

## 2022-06-17 NOTE — Progress Notes (Addendum)
eLink Physician-Brief Progress Note Patient Name: Carla [name] DOB: 08/10/1945 MRN: [id number]   Date of Service  06/17/2022  HPI/Events of Note  Notified of hypertension with BP 185/66, with labetalol ordered PRN for hypertension but HR only in the 50s.    eICU Interventions  Change to hydralazine IV PRN.      Intervention Category Intermediate Interventions: Hypertension - evaluation and management  Nihal Doan 06/17/2022, 2:58 AM  4:35 AM Notified of agitation and restlessness on the vent.  Pt is on max dose of precedex at 1.2 and getting fentanyl 50mcg q2hrs.  Fentanyl gtt was discontinued 06/16/22.    Pt is tachypneic and moving around in the bed.   Plan> Give versed 2mg IV now.  Continue fentanyl PRN and precedex gtt. Wrist restraints ordered as patient reaching out for the tube.   5:01 AM Pt remained restless even after the versed bolus.   Plan> Give another fentanyl bolus 100mcg IV now.   

## 2022-06-18 DIAGNOSIS — J9602 Acute respiratory failure with hypercapnia: Secondary | ICD-10-CM | POA: Diagnosis not present

## 2022-06-18 DIAGNOSIS — J9601 Acute respiratory failure with hypoxia: Secondary | ICD-10-CM | POA: Diagnosis not present

## 2022-06-18 LAB — GLUCOSE, CAPILLARY
Glucose-Capillary: 123 mg/dL — ABNORMAL HIGH (ref 70–99)
Glucose-Capillary: 174 mg/dL — ABNORMAL HIGH (ref 70–99)
Glucose-Capillary: 236 mg/dL — ABNORMAL HIGH (ref 70–99)
Glucose-Capillary: 172 mg/dL — ABNORMAL HIGH (ref 70–99)
Glucose-Capillary: 168 mg/dL — ABNORMAL HIGH (ref 70–99)
Glucose-Capillary: 156 mg/dL — ABNORMAL HIGH (ref 70–99)

## 2022-06-18 LAB — BASIC METABOLIC PANEL
Anion gap: 13 (ref 5–15)
BUN: 35 mg/dL — ABNORMAL HIGH (ref 8–23)
CO2: 34 mmol/L — ABNORMAL HIGH (ref 22–32)
Calcium: 9.9 mg/dL (ref 8.9–10.3)
Chloride: 97 mmol/L — ABNORMAL LOW (ref 98–111)
Creatinine, Ser: 0.92 mg/dL (ref 0.44–1.00)
GFR, Estimated: >60 mL/min (ref >60)
Glucose, Bld: 194 mg/dL — ABNORMAL HIGH (ref 70–99)
Potassium: 4.1 mmol/L (ref 3.5–5.1)
Sodium: 144 mmol/L (ref 135–145)

## 2022-06-18 LAB — CBC
HCT: 47.3 % — ABNORMAL HIGH (ref 36.0–46.0)
Hemoglobin: 14.9 g/dL (ref 12.0–15.0)
MCH: 30.8 pg (ref 26.0–34.0)
MCHC: 31.5 g/dL (ref 30.0–36.0)
MCV: 97.9 fL (ref 80.0–100.0)
Platelets: 215 10*3/uL (ref 150–400)
RBC: 4.83 MIL/uL (ref 3.87–5.11)
RDW: 14.2 % (ref 11.5–15.5)
WBC: 15.8 10*3/uL — ABNORMAL HIGH (ref 4.0–10.5)
nRBC: 0.3 % — ABNORMAL HIGH (ref 0.0–0.2)

## 2022-06-18 MED ORDER — INSULIN ASPART 100 UNIT/ML IJ SOLN
0.0000 [IU] | INTRAMUSCULAR | Status: DC
  Administered 2022-06-18: 3 [IU] via SUBCUTANEOUS
  Administered 2022-06-18: 5 [IU] via SUBCUTANEOUS
  Administered 2022-06-18: 3 [IU] via SUBCUTANEOUS
  Administered 2022-06-19: 2 [IU] via SUBCUTANEOUS
  Administered 2022-06-19 (×2): 3 [IU] via SUBCUTANEOUS
  Administered 2022-06-19: 2 [IU] via SUBCUTANEOUS
  Administered 2022-06-19: 3 [IU] via SUBCUTANEOUS
  Administered 2022-06-19: 5 [IU] via SUBCUTANEOUS
  Administered 2022-06-20 (×2): 3 [IU] via SUBCUTANEOUS
  Administered 2022-06-20: 2 [IU] via SUBCUTANEOUS
  Administered 2022-06-20: 5 [IU] via SUBCUTANEOUS
  Administered 2022-06-20: 3 [IU] via SUBCUTANEOUS
  Administered 2022-06-20 – 2022-06-21 (×5): 2 [IU] via SUBCUTANEOUS
  Administered 2022-06-21: 3 [IU] via SUBCUTANEOUS
  Administered 2022-06-21 – 2022-06-22 (×2): 2 [IU] via SUBCUTANEOUS
  Administered 2022-06-22: 5 [IU] via SUBCUTANEOUS
  Administered 2022-06-22: 3 [IU] via SUBCUTANEOUS
  Administered 2022-06-22: 2 [IU] via SUBCUTANEOUS
  Administered 2022-06-22 – 2022-06-23 (×4): 3 [IU] via SUBCUTANEOUS
  Administered 2022-06-23 (×2): 5 [IU] via SUBCUTANEOUS
  Administered 2022-06-23 – 2022-06-24 (×2): 3 [IU] via SUBCUTANEOUS
  Administered 2022-06-24: 2 [IU] via SUBCUTANEOUS
  Administered 2022-06-24: 8 [IU] via SUBCUTANEOUS
  Administered 2022-06-25 (×2): 3 [IU] via SUBCUTANEOUS
  Administered 2022-06-25: 5 [IU] via SUBCUTANEOUS
  Administered 2022-06-25 – 2022-06-26 (×6): 3 [IU] via SUBCUTANEOUS
  Administered 2022-06-27: 2 [IU] via SUBCUTANEOUS
  Administered 2022-06-27 (×2): 5 [IU] via SUBCUTANEOUS
  Administered 2022-06-27: 2 [IU] via SUBCUTANEOUS
  Administered 2022-06-27: 3 [IU] via SUBCUTANEOUS
  Administered 2022-06-27: 2 [IU] via SUBCUTANEOUS
  Administered 2022-06-28: 3 [IU] via SUBCUTANEOUS
  Administered 2022-06-28: 5 [IU] via SUBCUTANEOUS
  Administered 2022-06-28 (×2): 3 [IU] via SUBCUTANEOUS
  Administered 2022-06-29 (×2): 2 [IU] via SUBCUTANEOUS
  Administered 2022-06-29 – 2022-06-30 (×7): 3 [IU] via SUBCUTANEOUS
  Administered 2022-06-30: 2 [IU] via SUBCUTANEOUS
  Administered 2022-07-01: 3 [IU] via SUBCUTANEOUS
  Administered 2022-07-01: 2 [IU] via SUBCUTANEOUS
  Administered 2022-07-01: 5 [IU] via SUBCUTANEOUS
  Administered 2022-07-01: 2 [IU] via SUBCUTANEOUS
  Administered 2022-07-02: 3 [IU] via SUBCUTANEOUS
  Administered 2022-07-02: 5 [IU] via SUBCUTANEOUS
  Administered 2022-07-02: 3 [IU] via SUBCUTANEOUS
  Administered 2022-07-02: 2 [IU] via SUBCUTANEOUS
  Administered 2022-07-02: 5 [IU] via SUBCUTANEOUS
  Administered 2022-07-03: 3 [IU] via SUBCUTANEOUS
  Administered 2022-07-03: 2 [IU] via SUBCUTANEOUS
  Administered 2022-07-03 (×3): 3 [IU] via SUBCUTANEOUS
  Administered 2022-07-03: 5 [IU] via SUBCUTANEOUS
  Administered 2022-07-04: 8 [IU] via SUBCUTANEOUS
  Administered 2022-07-04 (×2): 5 [IU] via SUBCUTANEOUS
  Administered 2022-07-04: 3 [IU] via SUBCUTANEOUS
  Administered 2022-07-04: 2 [IU] via SUBCUTANEOUS
  Administered 2022-07-05: 5 [IU] via SUBCUTANEOUS
  Administered 2022-07-05: 2 [IU] via SUBCUTANEOUS
  Administered 2022-07-05: 3 [IU] via SUBCUTANEOUS
  Administered 2022-07-05 – 2022-07-06 (×3): 2 [IU] via SUBCUTANEOUS
  Administered 2022-07-06: 3 [IU] via SUBCUTANEOUS
  Administered 2022-07-06: 5 [IU] via SUBCUTANEOUS
  Administered 2022-07-06: 3 [IU] via SUBCUTANEOUS
  Administered 2022-07-07: 2 [IU] via SUBCUTANEOUS
  Administered 2022-07-07 (×4): 3 [IU] via SUBCUTANEOUS
  Administered 2022-07-07 – 2022-07-08 (×3): 2 [IU] via SUBCUTANEOUS
  Administered 2022-07-08 – 2022-07-09 (×6): 3 [IU] via SUBCUTANEOUS
  Administered 2022-07-10 – 2022-07-11 (×3): 2 [IU] via SUBCUTANEOUS
  Administered 2022-07-12: 3 [IU] via SUBCUTANEOUS

## 2022-06-18 MED ORDER — SENNA 8.6 MG PO TABS
2.0000 | ORAL_TABLET | Freq: Every day | ORAL | Status: DC
  Administered 2022-06-18 – 2022-06-23 (×3): 17.2 mg
  Filled 2022-06-18 (×4): qty 2

## 2022-06-18 MED ORDER — POLYETHYLENE GLYCOL 3350 17 G PO PACK
17.0000 g | PACK | Freq: Two times a day (BID) | ORAL | Status: DC
  Administered 2022-06-18 – 2022-06-25 (×7): 17 g
  Filled 2022-06-18 (×9): qty 1

## 2022-06-18 NOTE — Progress Notes (Addendum)
NAME:  Carla [name], MRN:  [id number], DOB:  04-25-1945, LOS: 6 ADMISSION DATE:  06/12/2022, CONSULTATION DATE:  06/13/2022 REFERRING MD:  Dr. Manuella Ghazi, [facility], CHIEF COMPLAINT:  short of breath   History of Present Illness:  77 yo female smoker with hx of COPD from emphysema found by family unresponsive and brought to ER.  Found to have acute on chronic hypercapnia on ABG.  Intubated in ER.  She was started on therapy for COPD exacerbation.  PCCM consulted to assist with respiratory management.  Pertinent  Medical History  Hypertension, Dementia, Anxiety, Depression, GERD, Primary hyperparathyroidism, Vit D deficiency, HLD, Osteoporosis  Significant Hospital Events: Including procedures, antibiotic start and stop dates in addition to other pertinent events   3/20 Admit, intubated  Studies:  CT angio chest 06/12/22 >> atherosclerosis, severe coronary calcification, severe LVH, mild centrilobular emphysema, 2 mm nodule LUL, 4 mm nodule LUL Blood culture 06/12/22 >> Haemophilus influenzae Echo 06/14/22 >> EF 60 to 65%, mod LVH, grade 1 DD, mild/mod AR 3/25 bronch for peak pressures, low tidal volumes with mucus cast at end of ETT, thick secretions caking tube  Interim History / Subjective:  Mucus cast stuck at end of ETT yesterday, suctioned and still has thick secretions lining ETT.   Looking surprisingly good on weaning this morning but still having to get suctioned pretty frequently   Objective   Blood pressure (!) 124/52, pulse 69, temperature 98.6 F (37 C), temperature source Axillary, resp. rate 20, height 5' (1.524 m), weight 43.5 kg, SpO2 94 %.    Vent Mode: PRVC FiO2 (%):  [40 %] 40 % Set Rate:  [18 bmp-20 bmp] 18 bmp Vt Set:  [360 mL] 360 mL PEEP:  [5 cmH20] 5 cmH20 Plateau Pressure:  [13 cmH20-18 cmH20] 13 cmH20   Intake/Output Summary (Last 24 hours) at 06/18/2022 0843 Last data filed at 06/18/2022 [id number] Gross per 24 hour  Intake 2939.07 ml  Output 2350 ml  Net 589.07 ml    Filed Weights   06/16/22 0500 06/17/22 0441 06/18/22 0222  Weight: 43.6 kg 44.5 kg 43.5 kg    Examination: General appearance: 77 y.o., female,chronically ill appearing Eyes: PERRL HENT: NCAT; dry MM Neck: Trachea midline; no lymphadenopathy, no JVD Lungs: rhonchi, normal wob, good tidal volumes on PS 10/5 CV: RRR, no murmur  Abdomen: Soft, non-tender; non-distended, BS present  Extremities: trace peripheral edema, warm Neuro: grossly nonfocal   Labs reviewed    Resolved Hospital Problem list     Assessment & Plan:   Acute on chronic hypoxia/hypercapnic respiratory failure from COPD exacerbation. COPD with emphysema. Tobacco abuse. - fentanyl gtt and precedex for rass 0 to -1 - secretions and mentals status remain a barrier to extubation - sat goal > 88% - cont prednisone taper - ongoing BD> yupelri, brovana, pulmicort, prn albuterol - cont abx as below  - intermittent CXR - smoking cessation counseling when appropriate - diurese with metolazone and lasix, back off on free water - PT/ OT  Haemophilus influenzae bacteremia. - currently of rocephin - tentative 7d course with ceftriaxone - trend WBC/ fever curve  Elevated troponin likely from demand ischemia. Coronary calcification on CT chest. - preserved EF on Echo - f/u with cardiology as outpt  Lung nodules. - f/u imaging as outpt  Acute metabolic encephalopathy from hypercapnia. Hx of dementia, anxiety, depression. - RASS goal 0 to -1 - consider adding enteral sedation pending response to precedex - supportive care as above   Hypernatremia - add  FWF, trend BMET - add metolazone for diuretic  HTN - continue norvasc, prn labetalol   Need to verify home meds.   Best Practice (right click and "Reselect all SmartList Selections" daily)   Diet/type: tubefeeds DVT prophylaxis: LMWH GI prophylaxis: PPI Lines: N/A Foley:  Yes, and it is still needed> given restlessness, purwick will not work Code  Status:  full code - confirmed with son today. They think she would probably be ok with tracheostomy if she needed it to recover knowing that she may have prolonged course of mechanical ventilation relative to the average pt with trach. Interestingly niece may kind of be the POA.    Labs       Latest Ref Rng & Units 06/18/2022    1:00 AM 06/17/2022   11:14 AM 06/17/2022    1:32 AM  CMP  Glucose 70 - 99 mg/dL 194   130   BUN 8 - 23 mg/dL 35   27   Creatinine 0.44 - 1.00 mg/dL 0.92   0.82   Sodium 135 - 145 mmol/L 144  145  147   Potassium 3.5 - 5.1 mmol/L 4.1  4.6  3.9   Chloride 98 - 111 mmol/L 97   104   CO2 22 - 32 mmol/L 34   34   Calcium 8.9 - 10.3 mg/dL 9.9   9.9        Latest Ref Rng & Units 06/18/2022    1:00 AM 06/17/2022   11:14 AM 06/17/2022    1:32 AM  CBC  WBC 4.0 - 10.5 K/uL 15.8   12.4   Hemoglobin 12.0 - 15.0 g/dL 14.9  15.6  13.7   Hematocrit 36.0 - 46.0 % 47.3  46.0  43.3   Platelets 150 - 400 K/uL 215   209     ABG    Component Value Date/Time   PHART 7.271 (L) 06/17/2022 1114   PCO2ART 77.7 (HH) 06/17/2022 1114   PO2ART 131 (H) 06/17/2022 1114   HCO3 35.7 (H) 06/17/2022 1114   TCO2 38 (H) 06/17/2022 1114   ACIDBASEDEF 2.4 (H) 06/12/2022 1606   O2SAT 98 06/17/2022 1114    CBG (last 3)  Recent Labs    06/17/22 2314 06/18/22 0310 06/18/22 0723  GLUCAP 218* 123* 174*   Critical care time: 35 minutes     Fredirick [facility]  06/18/2022, 8:43 AM  See Amion for pager If no response to pager, please call PCCM consult pager After 7:00 pm call Elink

## 2022-06-19 DIAGNOSIS — J9602 Acute respiratory failure with hypercapnia: Secondary | ICD-10-CM | POA: Diagnosis not present

## 2022-06-19 DIAGNOSIS — J9601 Acute respiratory failure with hypoxia: Secondary | ICD-10-CM | POA: Diagnosis not present

## 2022-06-19 LAB — CBC
HCT: 44.9 % (ref 36.0–46.0)
Hemoglobin: 14.2 g/dL (ref 12.0–15.0)
MCH: 30.8 pg (ref 26.0–34.0)
MCHC: 31.6 g/dL (ref 30.0–36.0)
MCV: 97.4 fL (ref 80.0–100.0)
Platelets: 223 10*3/uL (ref 150–400)
RBC: 4.61 MIL/uL (ref 3.87–5.11)
RDW: 14.3 % (ref 11.5–15.5)
WBC: 15.6 10*3/uL — ABNORMAL HIGH (ref 4.0–10.5)
nRBC: 0 % (ref 0.0–0.2)

## 2022-06-19 LAB — GLUCOSE, CAPILLARY
Glucose-Capillary: 121 mg/dL — ABNORMAL HIGH (ref 70–99)
Glucose-Capillary: 164 mg/dL — ABNORMAL HIGH (ref 70–99)
Glucose-Capillary: 153 mg/dL — ABNORMAL HIGH (ref 70–99)
Glucose-Capillary: 113 mg/dL — ABNORMAL HIGH (ref 70–99)
Glucose-Capillary: 235 mg/dL — ABNORMAL HIGH (ref 70–99)
Glucose-Capillary: 148 mg/dL — ABNORMAL HIGH (ref 70–99)

## 2022-06-19 LAB — BASIC METABOLIC PANEL
Anion gap: 9 (ref 5–15)
BUN: 30 mg/dL — ABNORMAL HIGH (ref 8–23)
CO2: 35 mmol/L — ABNORMAL HIGH (ref 22–32)
Calcium: 9.7 mg/dL (ref 8.9–10.3)
Chloride: 97 mmol/L — ABNORMAL LOW (ref 98–111)
Creatinine, Ser: 0.85 mg/dL (ref 0.44–1.00)
GFR, Estimated: >60 mL/min (ref >60)
Glucose, Bld: 152 mg/dL — ABNORMAL HIGH (ref 70–99)
Potassium: 4.4 mmol/L (ref 3.5–5.1)
Sodium: 141 mmol/L (ref 135–145)

## 2022-06-19 MED ORDER — FUROSEMIDE 10 MG/ML IJ SOLN
40.0000 mg | Freq: Once | INTRAMUSCULAR | Status: AC
  Administered 2022-06-19: 40 mg via INTRAVENOUS
  Filled 2022-06-19: qty 4

## 2022-06-19 MED ORDER — LACTULOSE 10 GM/15ML PO SOLN: 10.0000 g | ORAL | Status: DC

## 2022-06-19 MED ORDER — METOLAZONE 5 MG PO TABS
5.0000 mg | ORAL_TABLET | Freq: Once | ORAL | Status: AC
  Administered 2022-06-19: 5 mg
  Filled 2022-06-19: qty 1

## 2022-06-19 MED ORDER — MIDAZOLAM HCL 2 MG/2ML IJ SOLN
2.0000 mg | INTRAMUSCULAR | Status: DC | PRN
  Administered 2022-06-19 – 2022-06-24 (×17): 2 mg via INTRAVENOUS
  Filled 2022-06-19 (×18): qty 2

## 2022-06-19 MED ORDER — BISACODYL 10 MG RE SUPP
10.0000 mg | Freq: Once | RECTAL | Status: AC
  Administered 2022-06-19: 10 mg via RECTAL
  Filled 2022-06-19: qty 1

## 2022-06-19 NOTE — Progress Notes (Addendum)
eLink Physician-Brief Progress Note Patient Name: Carla [name] DOB: 06-20-1945 MRN: [id number]   Date of Service  06/19/2022  HPI/Events of Note  Patient is intubated and on the ventilator. She needs restraints renewed to prevent self-extubation.  eICU Interventions  Restraints renewed.        Migdalia Dk 06/19/2022, 9:18 PM

## 2022-06-19 NOTE — Progress Notes (Addendum)
NAME:  Carla [name], MRN:  [id number], DOB:  08-31-1945, LOS: 7 ADMISSION DATE:  06/12/2022, CONSULTATION DATE:  06/13/2022 REFERRING MD:  Dr. Manuella Ghazi, [facility], CHIEF COMPLAINT:  short of breath   History of Present Illness:  77 yo female smoker with hx of COPD from emphysema found by family unresponsive and brought to ER.  Found to have acute on chronic hypercapnia on ABG.  Intubated in ER.  She was started on therapy for COPD exacerbation.  PCCM consulted to assist with respiratory management.  Pertinent  Medical History  Hypertension, Dementia, Anxiety, Depression, GERD, Primary hyperparathyroidism, Vit D deficiency, HLD, Osteoporosis  Significant Hospital Events: Including procedures, antibiotic start and stop dates in addition to other pertinent events   3/20 Admit, intubated  Studies:  CT angio chest 06/12/22 >> atherosclerosis, severe coronary calcification, severe LVH, mild centrilobular emphysema, 2 mm nodule LUL, 4 mm nodule LUL Blood culture 06/12/22 >> Haemophilus influenzae Echo 06/14/22 >> EF 60 to 65%, mod LVH, grade 1 DD, mild/mod AR 3/25 bronch for peak pressures, low tidal volumes with mucus cast at end of ETT, thick secretions caking tube  Interim History / Subjective:  Lots of progress with pulmonary toilet yesterday, looking good on SBT this morning, still deeply sedated however  Objective   Blood pressure (!) 117/57, pulse 68, temperature 98.1 F (36.7 C), temperature source Axillary, resp. rate 19, height 5' (1.524 m), weight 45.9 kg, SpO2 93 %.    Vent Mode: PSV;CPAP FiO2 (%):  [40 %] 40 % Set Rate:  [18 bmp] 18 bmp Vt Set:  [360 mL] 360 mL PEEP:  [5 cmH20] 5 cmH20 Pressure Support:  [8 cmH20-10 cmH20] 8 cmH20 Plateau Pressure:  [13 cmH20-14 cmH20] 14 cmH20   Intake/Output Summary (Last 24 hours) at 06/19/2022 [id number] Last data filed at 06/19/2022 0900 Gross per 24 hour  Intake 2124.6 ml  Output 685 ml  Net 1439.6 ml   Filed Weights   06/17/22 0441 06/18/22  0222 06/19/22 0351  Weight: 44.5 kg 43.5 kg 45.9 kg    Examination: General appearance: 77 y.o., female,chronically ill appearing Eyes: PERRL HENT: NCAT; dry MM Neck: Trachea midline; no lymphadenopathy, no JVD Lungs: rhonchi, equal chest rise CV: RRR, no murmur  Abdomen: Soft, non-tender; non-distended, BS present  Extremities: trace peripheral edema, warm Neuro: grossly nonfocal   Labs reviewed    Resolved Hospital Problem list     Assessment & Plan:   Acute on chronic hypoxia/hypercapnic respiratory failure from COPD exacerbation. COPD with emphysema. Tobacco abuse. - stop fentanyl and can extubate on precedex to BiPAP - sat goal > 88% - ongoing BD> yupelri, brovana, pulmicort, prn albuterol - cont abx as below  - smoking cessation counseling when appropriate - diurese with metolazone and lasix, back off on free water - PT/ OT  Haemophilus influenzae bacteremia. - currently of rocephin - tentative 7d course with ceftriaxone - trend WBC/ fever curve  Elevated troponin likely from demand ischemia. Coronary calcification on CT chest. - preserved EF on Echo - f/u with cardiology as outpt  Lung nodules. - f/u imaging as outpt  Acute metabolic encephalopathy from hypercapnia. Hx of dementia, anxiety, depression. - RASS goal 0 to -1 - supportive care as above   HTN - continue norvasc, prn labetalol   Need to verify home meds.   Best Practice (right click and "Reselect all SmartList Selections" daily)   Diet/type: tubefeeds DVT prophylaxis: LMWH GI prophylaxis: PPI Lines: N/A Foley:  Yes, and it is still  needed> given restlessness, purewick will not work Code Status:  full code - confirmed with son 3/27   Labs       Latest Ref Rng & Units 06/19/2022    1:51 AM 06/18/2022    1:00 AM 06/17/2022   11:14 AM  CMP  Glucose 70 - 99 mg/dL 152  194    BUN 8 - 23 mg/dL 30  35    Creatinine 0.44 - 1.00 mg/dL 0.85  0.92    Sodium 135 - 145 mmol/L 141  144   145   Potassium 3.5 - 5.1 mmol/L 4.4  4.1  4.6   Chloride 98 - 111 mmol/L 97  97    CO2 22 - 32 mmol/L 35  34    Calcium 8.9 - 10.3 mg/dL 9.7  9.9         Latest Ref Rng & Units 06/19/2022    1:51 AM 06/18/2022    1:00 AM 06/17/2022   11:14 AM  CBC  WBC 4.0 - 10.5 K/uL 15.6  15.8    Hemoglobin 12.0 - 15.0 g/dL 14.2  14.9  15.6   Hematocrit 36.0 - 46.0 % 44.9  47.3  46.0   Platelets 150 - 400 K/uL 223  215      ABG    Component Value Date/Time   PHART 7.271 (L) 06/17/2022 1114   PCO2ART 77.7 (HH) 06/17/2022 1114   PO2ART 131 (H) 06/17/2022 1114   HCO3 35.7 (H) 06/17/2022 1114   TCO2 38 (H) 06/17/2022 1114   ACIDBASEDEF 2.4 (H) 06/12/2022 1606   O2SAT 98 06/17/2022 1114    CBG (last 3)  Recent Labs    06/18/22 2325 06/19/22 0324 06/19/22 0731  GLUCAP 156* 121* 164*   Critical care time: 35 minutes   [facility]  06/19/2022, 9:23 AM  See Shea Evans for pager If no response to pager, please call PCCM consult pager After 7:00 pm call Elink

## 2022-06-20 ENCOUNTER — Inpatient Hospital Stay (HOSPITAL_COMMUNITY): Payer: HMO

## 2022-06-20 DIAGNOSIS — J9602 Acute respiratory failure with hypercapnia: Secondary | ICD-10-CM | POA: Diagnosis not present

## 2022-06-20 DIAGNOSIS — R0682 Tachypnea, not elsewhere classified: Secondary | ICD-10-CM | POA: Diagnosis not present

## 2022-06-20 DIAGNOSIS — J9601 Acute respiratory failure with hypoxia: Secondary | ICD-10-CM | POA: Diagnosis not present

## 2022-06-20 LAB — BASIC METABOLIC PANEL
Anion gap: 9 (ref 5–15)
BUN: 35 mg/dL — ABNORMAL HIGH (ref 8–23)
CO2: 38 mmol/L — ABNORMAL HIGH (ref 22–32)
Calcium: 9.1 mg/dL (ref 8.9–10.3)
Chloride: 95 mmol/L — ABNORMAL LOW (ref 98–111)
Creatinine, Ser: 0.91 mg/dL (ref 0.44–1.00)
GFR, Estimated: >60 mL/min (ref >60)
Glucose, Bld: 127 mg/dL — ABNORMAL HIGH (ref 70–99)
Potassium: 4.8 mmol/L (ref 3.5–5.1)
Sodium: 142 mmol/L (ref 135–145)

## 2022-06-20 LAB — CBC
HCT: 43.7 % (ref 36.0–46.0)
Hemoglobin: 13.6 g/dL (ref 12.0–15.0)
MCH: 30.5 pg (ref 26.0–34.0)
MCHC: 31.1 g/dL (ref 30.0–36.0)
MCV: 98 fL (ref 80.0–100.0)
Platelets: 185 10*3/uL (ref 150–400)
RBC: 4.46 MIL/uL (ref 3.87–5.11)
RDW: 14.3 % (ref 11.5–15.5)
WBC: 17.2 10*3/uL — ABNORMAL HIGH (ref 4.0–10.5)
nRBC: 0 % (ref 0.0–0.2)

## 2022-06-20 LAB — GLUCOSE, CAPILLARY
Glucose-Capillary: 141 mg/dL — ABNORMAL HIGH (ref 70–99)
Glucose-Capillary: 148 mg/dL — ABNORMAL HIGH (ref 70–99)
Glucose-Capillary: 173 mg/dL — ABNORMAL HIGH (ref 70–99)
Glucose-Capillary: 175 mg/dL — ABNORMAL HIGH (ref 70–99)
Glucose-Capillary: 188 mg/dL — ABNORMAL HIGH (ref 70–99)
Glucose-Capillary: 209 mg/dL — ABNORMAL HIGH (ref 70–99)

## 2022-06-20 NOTE — Progress Notes (Addendum)
NAME:  Carla [name], MRN:  [id number], DOB:  Jan 14, 1946, LOS: 8 ADMISSION DATE:  06/12/2022, CONSULTATION DATE:  06/13/2022 REFERRING MD:  Dr. Manuella Ghazi, [facility], CHIEF COMPLAINT:  short of breath   History of Present Illness:  77 yo female smoker with hx of COPD from emphysema found by family unresponsive and brought to ER.  Found to have acute on chronic hypercapnia on ABG.  Intubated in ER.  She was started on therapy for COPD exacerbation.  PCCM consulted to assist with respiratory management.  Pertinent  Medical History  Hypertension, Dementia, Anxiety, Depression, GERD, Primary hyperparathyroidism, Vit D deficiency, HLD, Osteoporosis  Significant Hospital Events: Including procedures, antibiotic start and stop dates in addition to other pertinent events   3/20 Admit, intubated  Studies:  CT angio chest 06/12/22 >> atherosclerosis, severe coronary calcification, severe LVH, mild centrilobular emphysema, 2 mm nodule LUL, 4 mm nodule LUL Blood culture 06/12/22 >> Haemophilus influenzae Echo 06/14/22 >> EF 60 to 65%, mod LVH, grade 1 DD, mild/mod AR 3/25 bronch for peak pressures, low tidal volumes with mucus cast at end of ETT, thick secretions caking tube  Interim History / Subjective:  Desaturation during SBT yesterday unfortunately, remains intubated  Doesn't look awesome on weaning today - agitated, tachypneic  Objective   Blood pressure (!) 101/59, pulse 90, temperature 98 F (36.7 C), temperature source Oral, resp. rate (!) 35, height 5' (1.524 m), weight 45 kg, SpO2 95 %.    Vent Mode: PRVC FiO2 (%):  [50 %-60 %] 60 % Set Rate:  [18 bmp] 18 bmp Vt Set:  [360 mL] 360 mL PEEP:  [5 cmH20] 5 cmH20 Pressure Support:  [8 cmH20] 8 cmH20 Plateau Pressure:  [15 cmH20-18 cmH20] 15 cmH20   Intake/Output Summary (Last 24 hours) at 06/20/2022 0913 Last data filed at 06/20/2022 0800 Gross per 24 hour  Intake 2017.03 ml  Output 1900 ml  Net 117.03 ml   Filed Weights   06/18/22 0222  06/19/22 0351 06/20/22 0441  Weight: 43.5 kg 45.9 kg 45 kg    Examination: General appearance: 77 y.o., female,chronically ill appearing Eyes: PERRL HENT: NCAT; dry MM Lungs: rhonchi, equal chest rise, tachypneic CV: RRR, no murmur  Abdomen: Soft, non-tender; non-distended, BS present  Extremities:Trace peripheral edema, warm Neuro: grossly nonfocal   Labs reviewed    Resolved Hospital Problem list     Assessment & Plan:   Acute on chronic hypoxia/hypercapnic respiratory failure from COPD exacerbation. COPD with emphysema. Tobacco abuse. - may need to exchange ETT today in case narrowing is limiting her SBT though peak pressures haven't been too bad  - fentanyl, precedex for rass 0 to -1 - sat goal > 88% - ongoing BD> yupelri, brovana, pulmicort, prn albuterol - cont abx as below  - pulmonary hygiene - smoking cessation counseling when appropriate - diurese for net negative fluid balance - PT/ OT  Haemophilus influenzae bacteremia. - currently of rocephin - tentative 7d course with ceftriaxone - trend WBC/ fever curve  Elevated troponin likely from demand ischemia. Coronary calcification on CT chest. - preserved EF on Echo - f/u with cardiology as outpt  Lung nodules. - f/u imaging as outpt  Acute metabolic encephalopathy from hypercapnia. Hx of dementia, anxiety, depression. - RASS goal 0 to -1 - supportive care as above   HTN - continue norvasc, prn labetalol   Need to verify home meds.   Best Practice (right click and "Reselect all SmartList Selections" daily)   Diet/type: tubefeeds DVT prophylaxis:  LMWH GI prophylaxis: PPI Lines: N/A Foley:  Yes, and it is still needed> given restlessness, purewick will not work Code Status:  full code - need to confirm plan for reintubation and likely trach vs one way extubation with family   Labs       Latest Ref Rng & Units 06/20/2022   12:46 AM 06/19/2022    1:51 AM 06/18/2022    1:00 AM  CMP  Glucose  70 - 99 mg/dL 127  152  194   BUN 8 - 23 mg/dL 35  30  35   Creatinine 0.44 - 1.00 mg/dL 0.91  0.85  0.92   Sodium 135 - 145 mmol/L 142  141  144   Potassium 3.5 - 5.1 mmol/L 4.8  4.4  4.1   Chloride 98 - 111 mmol/L 95  97  97   CO2 22 - 32 mmol/L 38  35  34   Calcium 8.9 - 10.3 mg/dL 9.1  9.7  9.9        Latest Ref Rng & Units 06/20/2022   12:46 AM 06/19/2022    1:51 AM 06/18/2022    1:00 AM  CBC  WBC 4.0 - 10.5 K/uL 17.2  15.6  15.8   Hemoglobin 12.0 - 15.0 g/dL 13.6  14.2  14.9   Hematocrit 36.0 - 46.0 % 43.7  44.9  47.3   Platelets 150 - 400 K/uL 185  223  215     ABG    Component Value Date/Time   PHART 7.271 (L) 06/17/2022 1114   PCO2ART 77.7 (HH) 06/17/2022 1114   PO2ART 131 (H) 06/17/2022 1114   HCO3 35.7 (H) 06/17/2022 1114   TCO2 38 (H) 06/17/2022 1114   ACIDBASEDEF 2.4 (H) 06/12/2022 1606   O2SAT 98 06/17/2022 1114    CBG (last 3)  Recent Labs    06/19/22 2326 06/20/22 0316 06/20/22 0721  GLUCAP 148* 148* 209*   Critical care time: 32 minutes   [facility]  06/20/2022, 9:13 AM  See Amion for pager If no response to pager, please call PCCM consult pager After 7:00 pm call Elink

## 2022-06-21 ENCOUNTER — Inpatient Hospital Stay (HOSPITAL_COMMUNITY): Payer: HMO

## 2022-06-21 ENCOUNTER — Other Ambulatory Visit: Payer: Medicare HMO

## 2022-06-21 DIAGNOSIS — J9601 Acute respiratory failure with hypoxia: Secondary | ICD-10-CM | POA: Diagnosis not present

## 2022-06-21 DIAGNOSIS — J9602 Acute respiratory failure with hypercapnia: Secondary | ICD-10-CM | POA: Diagnosis not present

## 2022-06-21 DIAGNOSIS — Z4659 Encounter for fitting and adjustment of other gastrointestinal appliance and device: Secondary | ICD-10-CM | POA: Diagnosis not present

## 2022-06-21 LAB — CBC
HCT: 42.5 % (ref 36.0–46.0)
Hemoglobin: 13.1 g/dL (ref 12.0–15.0)
MCH: 30.3 pg (ref 26.0–34.0)
MCHC: 30.8 g/dL (ref 30.0–36.0)
MCV: 98.4 fL (ref 80.0–100.0)
Platelets: 270 10*3/uL (ref 150–400)
RBC: 4.32 MIL/uL (ref 3.87–5.11)
RDW: 14 % (ref 11.5–15.5)
WBC: 18.2 10*3/uL — ABNORMAL HIGH (ref 4.0–10.5)
nRBC: 0 % (ref 0.0–0.2)

## 2022-06-21 LAB — BASIC METABOLIC PANEL
Anion gap: 12 (ref 5–15)
BUN: 31 mg/dL — ABNORMAL HIGH (ref 8–23)
CO2: 38 mmol/L — ABNORMAL HIGH (ref 22–32)
Calcium: 9.5 mg/dL (ref 8.9–10.3)
Chloride: 95 mmol/L — ABNORMAL LOW (ref 98–111)
Creatinine, Ser: 0.89 mg/dL (ref 0.44–1.00)
GFR, Estimated: >60 mL/min (ref >60)
Glucose, Bld: 175 mg/dL — ABNORMAL HIGH (ref 70–99)
Potassium: 3.7 mmol/L (ref 3.5–5.1)
Sodium: 145 mmol/L (ref 135–145)

## 2022-06-21 LAB — MRSA NEXT GEN BY PCR, NASAL: MRSA by PCR Next Gen: NOT DETECTED

## 2022-06-21 LAB — GLUCOSE, CAPILLARY
Glucose-Capillary: 132 mg/dL — ABNORMAL HIGH (ref 70–99)
Glucose-Capillary: 136 mg/dL — ABNORMAL HIGH (ref 70–99)
Glucose-Capillary: 138 mg/dL — ABNORMAL HIGH (ref 70–99)
Glucose-Capillary: 145 mg/dL — ABNORMAL HIGH (ref 70–99)
Glucose-Capillary: 147 mg/dL — ABNORMAL HIGH (ref 70–99)
Glucose-Capillary: 160 mg/dL — ABNORMAL HIGH (ref 70–99)

## 2022-06-21 MED ORDER — SODIUM CHLORIDE 0.9% FLUSH: 10.0000 mL | INTRAVENOUS | Status: DC | PRN

## 2022-06-21 MED ORDER — PIPERACILLIN-TAZOBACTAM 3.375 G IVPB
3.3750 g | Freq: Three times a day (TID) | INTRAVENOUS | Status: AC
  Administered 2022-06-21 – 2022-06-28 (×21): 3.375 g via INTRAVENOUS
  Filled 2022-06-21 (×21): qty 50

## 2022-06-21 MED ORDER — SODIUM CHLORIDE 0.9% FLUSH
10.0000 mL | Freq: Two times a day (BID) | INTRAVENOUS | Status: DC
  Administered 2022-06-21 – 2022-07-02 (×19): 10 mL
  Administered 2022-07-02 – 2022-07-03 (×2): 30 mL
  Administered 2022-07-03: 20 mL
  Administered 2022-07-04: 10 mL
  Administered 2022-07-04: 20 mL
  Administered 2022-07-05 – 2022-07-07 (×5): 10 mL
  Administered 2022-07-07: 30 mL
  Administered 2022-07-08 – 2022-07-26 (×29): 10 mL

## 2022-06-21 MED ORDER — PIPERACILLIN-TAZOBACTAM 3.375 G IVPB: 3.3750 g | Freq: Three times a day (TID) | INTRAVENOUS | Status: DC

## 2022-06-21 MED ORDER — QUETIAPINE FUMARATE 50 MG PO TABS
50.0000 mg | ORAL_TABLET | Freq: Every day | ORAL | Status: DC
  Administered 2022-06-21: 50 mg
  Filled 2022-06-21: qty 1

## 2022-06-21 MED ORDER — PIPERACILLIN-TAZOBACTAM 3.375 G IVPB 30 MIN
3.3750 g | INTRAVENOUS | Status: AC
  Administered 2022-06-21: 3.375 g via INTRAVENOUS
  Filled 2022-06-21: qty 50

## 2022-06-21 NOTE — Progress Notes (Addendum)
NAME:  Carla [name], MRN:  [id number], DOB:  09-08-1945, LOS: 3 ADMISSION DATE:  06/12/2022, CONSULTATION DATE:  06/13/2022 REFERRING MD:  Dr. Manuella Ghazi, [facility], CHIEF COMPLAINT:  short of breath   History of Present Illness:  77 yo female smoker with hx of COPD from emphysema found by family unresponsive and brought to ER.  Found to have acute on chronic hypercapnia on ABG.  Intubated in ER.  She was started on therapy for COPD exacerbation.  PCCM consulted to assist with respiratory management.  Pertinent  Medical History  Hypertension, Dementia, Anxiety, Depression, GERD, Primary hyperparathyroidism, Vit D deficiency, HLD, Osteoporosis  Significant Hospital Events: Including procedures, antibiotic start and stop dates in addition to other pertinent events   3/20 Admit, intubated  Studies:  CT angio chest 06/12/22 >> atherosclerosis, severe coronary calcification, severe LVH, mild centrilobular emphysema, 2 mm nodule LUL, 4 mm nodule LUL Blood culture 06/12/22 >> Haemophilus influenzae Echo 06/14/22 >> EF 60 to 65%, mod LVH, grade 1 DD, mild/mod AR 3/25 bronch for peak pressures, low tidal volumes with mucus cast at end of ETT, thick secretions caking tube  Interim History / Subjective:  Tachypneic overnight required boluses of versed  CXR with LLL atelectasis  Rising fever curve, WBCs  Objective   Blood pressure (!) 113/51, pulse 64, temperature 100.1 F (37.8 C), temperature source Oral, resp. rate (!) 22, height 5' (1.524 m), weight 43.9 kg, SpO2 97 %.    Vent Mode: PRVC FiO2 (%):  [40 %-60 %] 40 % Set Rate:  [18 bmp] 18 bmp Vt Set:  [360 mL] 360 mL PEEP:  [5 cmH20-8 cmH20] 5 cmH20 Pressure Support:  [5 cmH20] 5 cmH20 Plateau Pressure:  [14 cmH20-31 cmH20] 15 cmH20   Intake/Output Summary (Last 24 hours) at 06/21/2022 0818 Last data filed at 06/21/2022 0800 Gross per 24 hour  Intake 2026.31 ml  Output 1000 ml  Net 1026.31 ml   Filed Weights   06/19/22 0351 06/20/22 0441  06/21/22 0400  Weight: 45.9 kg 45 kg 43.9 kg    Examination: General appearance: 77 y.o., female, chronically ill appearing Eyes: PERRL HENT: NCAT;  dry MM Lungs: rhonchi, equal chest rise, tachypneic CV: RRR, no murmur  Abdomen: Soft, non-tender; non-distended, BS present  Extremities:Trace peripheral edema, warm Neuro: grossly nonfocal   Labs reviewed    Resolved Hospital Problem list   Haemophilus influenzae bacteremia - completed 7d course ceftriaxone 3/28  Assessment & Plan:   Acute on chronic hypoxia/hypercapnic respiratory failure from COPD exacerbation. Tobacco abuse Possibly developing VAP with LLL opacity vs atelectasis.  - if fails SBT today then will bronch/BAL LLL and consider ETT exchange if tube continues to appear stenotic due to inspissated secretions - fentanyl, precedex for rass 0 to -1 - sat goal > 88% - ongoing BD> yupelri, brovana, pulmicort, prn albuterol - cont abx as below  - pulmonary hygiene - smoking cessation counseling when appropriate - diurese for net negative fluid balance - PT/ OT  Leukocytosis - trend cbc, fever curve  Elevated troponin likely from demand ischemia. Coronary calcification on CT chest. - preserved EF on Echo - f/u with cardiology as outpt  Lung nodules. - f/u imaging as outpt  Acute metabolic encephalopathy from hypercapnia. Hx of dementia, anxiety, depression. - RASS goal 0 to -1 - supportive care as above   HTN - continue norvasc, prn labetalol   Need to verify home meds.   Best Practice (right click and "Reselect all SmartList Selections" daily)  Diet/type: tubefeeds - hold on SBT and in advance possible bronchoscopy today DVT prophylaxis: LMWH GI prophylaxis: PPI Lines: N/A Foley:  Yes, and it is still needed> given restlessness, purewick will not work Code Status:  full code - need to confirm plan for reintubation and likely trach vs one way extubation with family. They plan to arrive today to  discuss   Labs       Latest Ref Rng & Units 06/20/2022   12:46 AM 06/19/2022    1:51 AM 06/18/2022    1:00 AM  CMP  Glucose 70 - 99 mg/dL 127  152  194   BUN 8 - 23 mg/dL 35  30  35   Creatinine 0.44 - 1.00 mg/dL 0.91  0.85  0.92   Sodium 135 - 145 mmol/L 142  141  144   Potassium 3.5 - 5.1 mmol/L 4.8  4.4  4.1   Chloride 98 - 111 mmol/L 95  97  97   CO2 22 - 32 mmol/L 38  35  34   Calcium 8.9 - 10.3 mg/dL 9.1  9.7  9.9        Latest Ref Rng & Units 06/20/2022   12:46 AM 06/19/2022    1:51 AM 06/18/2022    1:00 AM  CBC  WBC 4.0 - 10.5 K/uL 17.2  15.6  15.8   Hemoglobin 12.0 - 15.0 g/dL 13.6  14.2  14.9   Hematocrit 36.0 - 46.0 % 43.7  44.9  47.3   Platelets 150 - 400 K/uL 185  223  215     ABG    Component Value Date/Time   PHART 7.271 (L) 06/17/2022 1114   PCO2ART 77.7 (HH) 06/17/2022 1114   PO2ART 131 (H) 06/17/2022 1114   HCO3 35.7 (H) 06/17/2022 1114   TCO2 38 (H) 06/17/2022 1114   ACIDBASEDEF 2.4 (H) 06/12/2022 1606   O2SAT 98 06/17/2022 1114    CBG (last 3)  Recent Labs    06/20/22 1933 06/20/22 2335 06/21/22 0338  GLUCAP 175* 141* 160*   Critical care time: 36 minutes   [place]  06/21/2022, 8:18 AM  See Amion for pager If no response to pager, please call PCCM consult pager After 7:00 pm call Elink

## 2022-06-21 NOTE — TOC Progression Note (Addendum)
Transition of Care (TOC) - Progression Note    Patient Details  Name: Carla [name] MRN: [id number] Date of Birth: 01/20/1946  Transition of Care (TOC) CM/SW Contact  Tom-Johnson, Smaran Gaus Daphne, RN Phone Number: 06/21/2022, 2:53 PM  Clinical Narrative:     Patient continues to be on full Ventilation. Had Bronchoscopy today. Cortrak placed today. No TOC needs or recommendations noted at this time.  CM will continue to follow for discharge needs.       Expected Discharge Plan and Services                                               Social Determinants of Health (SDOH) Interventions    Readmission Risk Interventions     No data to display          

## 2022-06-21 NOTE — Procedures (Addendum)
Bronchoscopy Procedure Note  Carla [name]  [id number]  Apr 06, 1945  Date:06/21/22  Time:11:37 AM   Provider Performing:Jacaden Forbush M Verlee Monte   Procedure(s):  Flexible bronchoscopy with bronchial alveolar lavage [phone]) and Subsequent Therapeutic Aspiration of Tracheobronchial Tree [phone])  Indication(s) Mucus plugging  Consent Unable to obtain consent due to emergent nature of procedure.  Anesthesia See [facility] for details   Time Out Verified patient identification, verified procedure, site/side was marked, verified correct patient position, special equipment/implants available, medications/allergies/relevant history reviewed, required imaging and test results available.   Sterile Technique Usual hand hygiene, masks, gowns, and gloves were used   Procedure Description Bronchoscope advanced through endotracheal tube and into airway.  Airways were examined down to subsegmental level with findings noted below.    Findings:  - purulent secretions lining trachea, around ETT, LMB, completely occluding LLL bronchus and lingula, suctioned - BAL performed in lingula with 60cc saline instilled and 15cc cloudy fluid returned   Complications/Tolerance None; patient tolerated the procedure well. Chest X-ray is not needed post procedure.   EBL Minimal   Specimen(s) BAL sent for culture

## 2022-06-22 DIAGNOSIS — Z66 Do not resuscitate: Secondary | ICD-10-CM | POA: Diagnosis not present

## 2022-06-22 DIAGNOSIS — E43 Unspecified severe protein-calorie malnutrition: Secondary | ICD-10-CM | POA: Diagnosis not present

## 2022-06-22 DIAGNOSIS — J9602 Acute respiratory failure with hypercapnia: Secondary | ICD-10-CM | POA: Diagnosis not present

## 2022-06-22 DIAGNOSIS — R578 Other shock: Secondary | ICD-10-CM | POA: Diagnosis not present

## 2022-06-22 DIAGNOSIS — J9621 Acute and chronic respiratory failure with hypoxia: Secondary | ICD-10-CM | POA: Diagnosis not present

## 2022-06-22 DIAGNOSIS — G9341 Metabolic encephalopathy: Secondary | ICD-10-CM | POA: Diagnosis not present

## 2022-06-22 DIAGNOSIS — J9601 Acute respiratory failure with hypoxia: Secondary | ICD-10-CM | POA: Diagnosis not present

## 2022-06-22 DIAGNOSIS — R7881 Bacteremia: Secondary | ICD-10-CM | POA: Diagnosis not present

## 2022-06-22 DIAGNOSIS — R6521 Severe sepsis with septic shock: Secondary | ICD-10-CM | POA: Diagnosis not present

## 2022-06-22 DIAGNOSIS — J441 Chronic obstructive pulmonary disease with (acute) exacerbation: Secondary | ICD-10-CM | POA: Diagnosis not present

## 2022-06-22 DIAGNOSIS — J14 Pneumonia due to Hemophilus influenzae: Secondary | ICD-10-CM | POA: Diagnosis not present

## 2022-06-22 DIAGNOSIS — Z1152 Encounter for screening for COVID-19: Secondary | ICD-10-CM | POA: Diagnosis not present

## 2022-06-22 DIAGNOSIS — T17490A Other foreign object in trachea causing asphyxiation, initial encounter: Secondary | ICD-10-CM | POA: Diagnosis not present

## 2022-06-22 DIAGNOSIS — A413 Sepsis due to Hemophilus influenzae: Secondary | ICD-10-CM | POA: Diagnosis not present

## 2022-06-22 LAB — PHOSPHORUS: Phosphorus: 2.7 mg/dL (ref 2.5–4.6)

## 2022-06-22 LAB — BASIC METABOLIC PANEL
Anion gap: 7 (ref 5–15)
BUN: 29 mg/dL — ABNORMAL HIGH (ref 8–23)
CO2: 39 mmol/L — ABNORMAL HIGH (ref 22–32)
Calcium: 8.8 mg/dL — ABNORMAL LOW (ref 8.9–10.3)
Chloride: 98 mmol/L (ref 98–111)
Creatinine, Ser: 0.93 mg/dL (ref 0.44–1.00)
GFR, Estimated: >60 mL/min (ref >60)
Glucose, Bld: 227 mg/dL — ABNORMAL HIGH (ref 70–99)
Potassium: 3.6 mmol/L (ref 3.5–5.1)
Sodium: 144 mmol/L (ref 135–145)

## 2022-06-22 LAB — GLUCOSE, CAPILLARY
Glucose-Capillary: 190 mg/dL — ABNORMAL HIGH (ref 70–99)
Glucose-Capillary: 198 mg/dL — ABNORMAL HIGH (ref 70–99)
Glucose-Capillary: 247 mg/dL — ABNORMAL HIGH (ref 70–99)
Glucose-Capillary: 137 mg/dL — ABNORMAL HIGH (ref 70–99)
Glucose-Capillary: 184 mg/dL — ABNORMAL HIGH (ref 70–99)
Glucose-Capillary: 179 mg/dL — ABNORMAL HIGH (ref 70–99)

## 2022-06-22 LAB — CBC
HCT: 35.2 % — ABNORMAL LOW (ref 36.0–46.0)
Hemoglobin: 11 g/dL — ABNORMAL LOW (ref 12.0–15.0)
MCH: 31 pg (ref 26.0–34.0)
MCHC: 31.3 g/dL (ref 30.0–36.0)
MCV: 99.2 fL (ref 80.0–100.0)
Platelets: 247 10*3/uL (ref 150–400)
RBC: 3.55 MIL/uL — ABNORMAL LOW (ref 3.87–5.11)
RDW: 14.2 % (ref 11.5–15.5)
WBC: 12.7 10*3/uL — ABNORMAL HIGH (ref 4.0–10.5)
nRBC: 0 % (ref 0.0–0.2)

## 2022-06-22 LAB — MAGNESIUM: Magnesium: 2.4 mg/dL (ref 1.7–2.4)

## 2022-06-22 MED ORDER — QUETIAPINE FUMARATE 50 MG PO TABS
50.0000 mg | ORAL_TABLET | Freq: Two times a day (BID) | ORAL | Status: DC
  Administered 2022-06-22 – 2022-07-02 (×21): 50 mg
  Filled 2022-06-22 (×21): qty 1

## 2022-06-22 NOTE — Progress Notes (Addendum)
NAME:  Carla [name], MRN:  [id number], DOB:  Dec 22, 1945, LOS: 10 ADMISSION DATE:  06/12/2022, CONSULTATION DATE:  06/13/2022 REFERRING MD:  Dr. Sherryll Burger, [facility], CHIEF COMPLAINT:  short of breath   History of Present Illness:  77 yo female smoker with hx of COPD from emphysema found by family unresponsive and brought to ER.  Found to have acute on chronic hypercapnia on ABG.  Intubated in ER.  She was started on therapy for COPD exacerbation.  PCCM consulted to assist with respiratory management.  Pertinent  Medical History  Hypertension, Dementia, Anxiety, Depression, GERD, Primary hyperparathyroidism, Vit D deficiency, HLD, Osteoporosis  Significant Hospital Events: Including procedures, antibiotic start and stop dates in addition to other pertinent events   3/20 Admit, intubated  Studies:  3/20 Admitted after being found unresponsive, intubated on ED arrival  CT angio chest >> atherosclerosis, severe coronary calcification, severe LVH, mild centrilobular emphysema, 2 mm nodule LUL, 4 mm nodule LUL Blood culture  >> Haemophilus influenzae 3/22 Had oxygen desaturation and low Vt with SBT this morning. Echo 06/14/22 >> EF 60 to 65%, mod LVH, grade 1 DD, mild/mod AR 3/25 bronch for peak pressures, low tidal volumes with mucus cast at end of ETT, thick secretions caking tube 3/29 Bronched again with purulent secretions LLL with plugging and LLL atelectasis, some increase in O2 requirement on vent, zosyn started for VAP coverage. MRSA nare swab today negative.  3/30 Remains intubated with waxing and weaning agitation on fentanyl and precedex   Interim History / Subjective:  Intermittently agitated on vent, initially failed SBT due to apnea and later attempted resulted in tachypnea   Objective   Blood pressure (!) 106/48, pulse 61, temperature 98.9 F (37.2 C), temperature source Oral, resp. rate 17, height 5' (1.524 m), weight 44.8 kg, SpO2 94 %.    Vent Mode: PRVC FiO2 (%):  [40 %-50 %]  40 % Set Rate:  [18 bmp] 18 bmp Vt Set:  [360 mL] 360 mL PEEP:  [5 cmH20-7 cmH20] 5 cmH20 Pressure Support:  [5 cmH20] 5 cmH20 Plateau Pressure:  [14 cmH20-16 cmH20] 16 cmH20   Intake/Output Summary (Last 24 hours) at 06/22/2022 4098 Last data filed at 06/22/2022 0700 Gross per 24 hour  Intake 2073.4 ml  Output 790 ml  Net 1283.4 ml    Filed Weights   06/21/22 0400 06/21/22 2200 06/22/22 0500  Weight: 43.9 kg 44.8 kg 44.8 kg   Examination: General: Acute on chronically ill appearing thin elderly female lying in bed on mechanical ventilation, in NAD HEENT: ETT, MM pink/moist, PERRL,  Neuro: Agitated on vent, unable to follow commands  CV: s1s2 regular rate and rhythm, no murmur, rubs, or gallops,  PULM:  Clear to auscultation, increased work of breathing intermittently GI: soft, bowel sounds active in all 4 quadrants, non-tender, non-distended, tolerating TF Extremities: warm/dry, no edema  Skin: no rashes or lesions   Resolved Hospital Problem list   Haemophilus influenzae bacteremia - completed 7d course ceftriaxone 3/28  Assessment & Plan:   Acute on chronic hypoxia/hypercapnic respiratory failure from  Leukocytosis - Questionable VAP - 3/30 Bronched again with purulent secretions LLL with plugging and LLL atelectasis, some increase in O2 requirement on vent, zosyn started for VAP coverage. MRSA nare swab today negative.  COPD exacerbation Tobacco abuse Lung nodule  P: Intermittently agitated on vent, initially failed SBT due to apnea and later attempted resulted in tachypnea with waxing and weaning agitation  Need to discuss with family plan of care  including pal for trach  Continue ventilator support with lung protective strategies  Wean PEEP and FiO2 for sats greater than 90%. Head of bed elevated 30 degrees. Plateau pressures less than 30 cm H20.  Follow intermittent chest x-ray and ABG.   Ensure adequate pulmonary hygiene  Follow cultures  VAP bundle in place   PAD protocol Continue bronchodilators Continue Zosyn, follow cultures   Diureses as able  F/u outpatient for lung nodule   Elevated troponin likely from demand ischemia. Coronary calcification on CT chest. -Preserved EF on Echo Hx of HTN  P: On and off pressors in the setting of sedation, stop oral antihypertensives  Continuous telemetry   Acute metabolic encephalopathy from hypercapnia. Hx of dementia, anxiety, depression P: Maintain neuro protective measures Nutrition and bowel regiment  Aspirations precautions  PAD protocol Delirium precautions   At risk malnutrition  P: Continue TF   Best Practice (right click and "Reselect all SmartList Selections" daily)   Diet/type: tubefeeds - hold on SBT and in advance possible bronchoscopy today DVT prophylaxis: LMWH GI prophylaxis: PPI Lines: N/A Foley:  Yes, and it is still needed> given restlessness, purewick will not work Code Status:  full code - need to confirm plan for reintubation and likely trach vs one way extubation with family. Plan was to discuss 3/29 but team was unable to contact family to discuss     Critical care time:   CRITICAL CARE Performed by: Whitney D. Harris  Total critical care time: 38 minutes  Critical care time was exclusive of separately billable procedures and treating other patients.  Critical care was necessary to treat or prevent imminent or life-threatening deterioration.  Critical care was time spent personally by me on the following activities: development of treatment plan with patient and/or surrogate as well as nursing, discussions with consultants, evaluation of patient's response to treatment, examination of patient, obtaining history from patient or surrogate, ordering and performing treatments and interventions, ordering and review of laboratory studies, ordering and review of radiographic studies, pulse oximetry and re-evaluation of patient's condition.  Whitney D. Harris,  NP-C [facility] Personal contact information can be found on Amion  If no contact or response made please call [phone] 06/22/2022, 10:20 AM

## 2022-06-23 DIAGNOSIS — J9601 Acute respiratory failure with hypoxia: Secondary | ICD-10-CM | POA: Diagnosis not present

## 2022-06-23 DIAGNOSIS — R7881 Bacteremia: Secondary | ICD-10-CM

## 2022-06-23 DIAGNOSIS — J9602 Acute respiratory failure with hypercapnia: Secondary | ICD-10-CM | POA: Diagnosis not present

## 2022-06-23 LAB — GLUCOSE, CAPILLARY
Glucose-Capillary: 154 mg/dL — ABNORMAL HIGH (ref 70–99)
Glucose-Capillary: 212 mg/dL — ABNORMAL HIGH (ref 70–99)
Glucose-Capillary: 108 mg/dL — ABNORMAL HIGH (ref 70–99)
Glucose-Capillary: 184 mg/dL — ABNORMAL HIGH (ref 70–99)
Glucose-Capillary: 178 mg/dL — ABNORMAL HIGH (ref 70–99)
Glucose-Capillary: 220 mg/dL — ABNORMAL HIGH (ref 70–99)

## 2022-06-23 LAB — CBC
HCT: 35.3 % — ABNORMAL LOW (ref 36.0–46.0)
Hemoglobin: 11 g/dL — ABNORMAL LOW (ref 12.0–15.0)
MCH: 30.9 pg (ref 26.0–34.0)
MCHC: 31.2 g/dL (ref 30.0–36.0)
MCV: 99.2 fL (ref 80.0–100.0)
Platelets: 303 10*3/uL (ref 150–400)
RBC: 3.56 MIL/uL — ABNORMAL LOW (ref 3.87–5.11)
RDW: 14.2 % (ref 11.5–15.5)
WBC: 12.1 10*3/uL — ABNORMAL HIGH (ref 4.0–10.5)
nRBC: 0 % (ref 0.0–0.2)

## 2022-06-23 LAB — PHOSPHORUS
Phosphorus: 2.8 mg/dL (ref 2.5–4.6)
Phosphorus: 2.4 mg/dL — ABNORMAL LOW (ref 2.5–4.6)

## 2022-06-23 LAB — CULTURE, BAL-QUANTITATIVE W GRAM STAIN: Culture: 10000 — AB

## 2022-06-23 LAB — BASIC METABOLIC PANEL
Anion gap: 8 (ref 5–15)
BUN: 27 mg/dL — ABNORMAL HIGH (ref 8–23)
CO2: 38 mmol/L — ABNORMAL HIGH (ref 22–32)
Calcium: 9 mg/dL (ref 8.9–10.3)
Chloride: 96 mmol/L — ABNORMAL LOW (ref 98–111)
Creatinine, Ser: 0.84 mg/dL (ref 0.44–1.00)
GFR, Estimated: >60 mL/min (ref >60)
Glucose, Bld: 195 mg/dL — ABNORMAL HIGH (ref 70–99)
Potassium: 3.6 mmol/L (ref 3.5–5.1)
Sodium: 142 mmol/L (ref 135–145)

## 2022-06-23 LAB — TROPONIN I (HIGH SENSITIVITY)
Troponin I (High Sensitivity): 47 ng/L — ABNORMAL HIGH (ref <18)
Troponin I (High Sensitivity): 41 ng/L — ABNORMAL HIGH (ref <18)

## 2022-06-23 LAB — MAGNESIUM
Magnesium: 2.4 mg/dL (ref 1.7–2.4)
Magnesium: 2.4 mg/dL (ref 1.7–2.4)

## 2022-06-23 MED ORDER — POTASSIUM CHLORIDE 20 MEQ PO PACK
40.0000 meq | PACK | Freq: Once | ORAL | Status: AC
  Administered 2022-06-23: 40 meq
  Filled 2022-06-23: qty 2

## 2022-06-23 MED ORDER — POTASSIUM & SODIUM PHOSPHATES 280-160-250 MG PO PACK
1.0000 | PACK | Freq: Once | ORAL | Status: AC
  Administered 2022-06-23: 1
  Filled 2022-06-23: qty 1

## 2022-06-23 NOTE — Progress Notes (Addendum)
eLink Physician-Brief Progress Note Patient Name: Carla [name] DOB: 12/14/1945 MRN: [id number]   Date of Service  06/23/2022  HPI/Events of Note  Notified of phos at 2.4, K 3.6, crea 0.84.  eICU Interventions  Give PhosNak x 1 via tube.      Intervention Category Minor Interventions: Electrolytes abnormality - evaluation and management  Lorynn Moeser 06/23/2022, 9:54 PM 

## 2022-06-23 NOTE — Progress Notes (Addendum)
NAME:  Carla [name], MRN:  [id number], DOB:  Sep 25, 1945, LOS: 11 ADMISSION DATE:  06/12/2022, CONSULTATION DATE:  06/13/2022 REFERRING MD:  Dr. Sherryll Burger, [facility], CHIEF COMPLAINT:  short of breath   History of Present Illness:  77 yo female smoker with hx of COPD from emphysema found by family unresponsive and brought to ER.  Found to have acute on chronic hypercapnia on ABG.  Intubated in ER.  She was started on therapy for COPD exacerbation.  PCCM consulted to assist with respiratory management.  Pertinent  Medical History  Hypertension, Dementia, Anxiety, Depression, GERD, Primary hyperparathyroidism, Vit D deficiency, HLD, Osteoporosis  Significant Hospital Events: Including procedures, antibiotic start and stop dates in addition to other pertinent events   3/20 Admit, intubated  Studies:  3/20 Admitted after being found unresponsive, intubated on ED arrival  CT angio chest >> atherosclerosis, severe coronary calcification, severe LVH, mild centrilobular emphysema, 2 mm nodule LUL, 4 mm nodule LUL Blood culture  >> Haemophilus influenzae 3/22 Had oxygen desaturation and low Vt with SBT this morning. Echo 06/14/22 >> EF 60 to 65%, mod LVH, grade 1 DD, mild/mod AR 3/25 bronch for peak pressures, low tidal volumes with mucus cast at end of ETT, thick secretions caking tube 3/29 Bronched again with purulent secretions LLL with plugging and LLL atelectasis, some increase in O2 requirement on vent, zosyn started for VAP coverage. MRSA nare swab today negative.  3/30 Remains intubated with waxing and weaning agitation on fentanyl and precedex  3/31 Remains on vent with continued issues with agitation when sedation lightened   Interim History / Subjective:  Seen after bolus of fentanyl, sedated on vent   Objective   Blood pressure (!) 114/48, pulse 75, temperature 98 F (36.7 C), temperature source Oral, resp. rate (!) 21, height 5' (1.524 m), weight 47.8 kg, SpO2 100 %.    Vent Mode:  PRVC FiO2 (%):  [40 %-60 %] 50 % Set Rate:  [18 bmp] 18 bmp Vt Set:  [360 mL] 360 mL PEEP:  [5 cmH20] 5 cmH20 Plateau Pressure:  [13 cmH20-15 cmH20] 13 cmH20   Intake/Output Summary (Last 24 hours) at 06/23/2022 4098 Last data filed at 06/23/2022 0600 Gross per 24 hour  Intake 2182.77 ml  Output 801 ml  Net 1381.77 ml    Filed Weights   06/21/22 2200 06/22/22 0500 06/23/22 0406  Weight: 44.8 kg 44.8 kg 47.8 kg   Examination: General: Acute on chronically ill appearing thin frail elderly female lying in bed on mechanical ventilation, in NAD HEENT: ETT, MM pink/moist, PERRL,  Neuro: Sedated on vent, reported spontaneous movements  CV: s1s2 regular rate and rhythm, no murmur, rubs, or gallops,  PULM:  Clear to auscultation, no increased work of breathing with sedation, when agitated tachypnea seen  GI: soft, bowel sounds active in all 4 quadrants, non-tender, non-distended, tolerating TF Extremities: warm/dry, no edema  Skin: no rashes or lesions   Resolved Hospital Problem list   Haemophilus influenzae bacteremia - completed 7d course ceftriaxone 3/28  Assessment & Plan:   Acute on chronic hypoxia/hypercapnic respiratory failure from  Leukocytosis - Questionable VAP - 3/30 Bronched again with purulent secretions LLL with plugging and LLL atelectasis, some increase in O2 requirement on vent, zosyn started for VAP coverage. MRSA nare swab today negative.  COPD exacerbation Tobacco abuse Lung nodule  P: Continue to wean sedation to allow for SBT, family meeting planned 4/1 to discuss plan of care including ongoing respiratory support  Continue ventilator support  with lung protective strategies  Wean PEEP and FiO2 for sats greater than 90%. Head of bed elevated 30 degrees. Plateau pressures less than 30 cm H20.  Follow intermittent chest x-ray and ABG.   SAT/SBT as tolerated, mentation preclude extubation  Ensure adequate pulmonary hygiene  Follow cultures  VAP bundle in  place  PAD protocol Continue BDs Remains on Zosyn, BAL with no growth but secretions persist  Diureses as able   Elevated troponin likely from demand ischemia. Coronary calcification on CT chest. -Preserved EF on Echo Hx of HTN  P: Pressors support appears to be driven by sedation  Continuous telemetry   Acute metabolic encephalopathy from hypercapnia. Hx of dementia, anxiety, depression P: Maintain neuro protective measures  PAD protocol Delirium precautions  Aspiration precautions   At risk malnutrition  P: Continue TF  Best Practice (right click and "Reselect all SmartList Selections" daily)   Diet/type: tubefeeds - hold on SBT and in advance possible bronchoscopy today DVT prophylaxis: LMWH GI prophylaxis: PPI Lines: N/A Foley:  Yes, and it is still needed> given restlessness, purewick will not work Code Status:  full code - need to confirm plan for reintubation and likely trach vs one way extubation with family. Plan was to discuss 3/29 but team was unable to contact family to discuss. Spoke to [facility] 3/30 with plans to met care team 4/1   Critical care time:   CRITICAL CARE Performed by: Whitney D. Harris  Total critical care time: 37 minutes  Critical care time was exclusive of separately billable procedures and treating other patients.  Critical care was necessary to treat or prevent imminent or life-threatening deterioration.  Critical care was time spent personally by me on the following activities: development of treatment plan with patient and/or surrogate as well as nursing, discussions with consultants, evaluation of patient's response to treatment, examination of patient, obtaining history from patient or surrogate, ordering and performing treatments and interventions, ordering and review of laboratory studies, ordering and review of radiographic studies, pulse oximetry and re-evaluation of patient's condition.  Whitney D. Harris, NP-C [facility] Personal contact information can be found on Amion  If no contact or response made please call [phone] 06/23/2022, 7:04 AM

## 2022-06-24 DIAGNOSIS — J9601 Acute respiratory failure with hypoxia: Secondary | ICD-10-CM | POA: Diagnosis not present

## 2022-06-24 DIAGNOSIS — J9602 Acute respiratory failure with hypercapnia: Secondary | ICD-10-CM | POA: Diagnosis not present

## 2022-06-24 LAB — GLUCOSE, CAPILLARY
Glucose-Capillary: 146 mg/dL — ABNORMAL HIGH (ref 70–99)
Glucose-Capillary: 96 mg/dL (ref 70–99)
Glucose-Capillary: 174 mg/dL — ABNORMAL HIGH (ref 70–99)
Glucose-Capillary: 113 mg/dL — ABNORMAL HIGH (ref 70–99)
Glucose-Capillary: 258 mg/dL — ABNORMAL HIGH (ref 70–99)
Glucose-Capillary: 273 mg/dL — ABNORMAL HIGH (ref 70–99)
Glucose-Capillary: 111 mg/dL — ABNORMAL HIGH (ref 70–99)

## 2022-06-24 LAB — BASIC METABOLIC PANEL
Anion gap: 6 (ref 5–15)
BUN: 25 mg/dL — ABNORMAL HIGH (ref 8–23)
CO2: 36 mmol/L — ABNORMAL HIGH (ref 22–32)
Calcium: 9 mg/dL (ref 8.9–10.3)
Chloride: 102 mmol/L (ref 98–111)
Creatinine, Ser: 0.84 mg/dL (ref 0.44–1.00)
GFR, Estimated: >60 mL/min (ref >60)
Glucose, Bld: 175 mg/dL — ABNORMAL HIGH (ref 70–99)
Potassium: 4 mmol/L (ref 3.5–5.1)
Sodium: 144 mmol/L (ref 135–145)

## 2022-06-24 LAB — MAGNESIUM
Magnesium: 2.3 mg/dL (ref 1.7–2.4)
Magnesium: 2.2 mg/dL (ref 1.7–2.4)

## 2022-06-24 LAB — PHOSPHORUS
Phosphorus: 3.2 mg/dL (ref 2.5–4.6)
Phosphorus: 3.8 mg/dL (ref 2.5–4.6)

## 2022-06-24 MED ORDER — FUROSEMIDE 10 MG/ML IJ SOLN: 40.0000 mg | Freq: Once | INTRAMUSCULAR | Status: DC

## 2022-06-24 MED ORDER — FUROSEMIDE 10 MG/ML IJ SOLN
40.0000 mg | Freq: Two times a day (BID) | INTRAMUSCULAR | Status: DC
  Administered 2022-06-24 – 2022-06-25 (×3): 40 mg via INTRAVENOUS
  Filled 2022-06-24 (×3): qty 4

## 2022-06-24 MED ORDER — CLONAZEPAM 0.5 MG PO TABS
0.5000 mg | ORAL_TABLET | Freq: Two times a day (BID) | ORAL | Status: DC
  Administered 2022-06-24 (×2): 0.5 mg
  Filled 2022-06-24 (×2): qty 1

## 2022-06-24 NOTE — Progress Notes (Addendum)
NAME:  Carla [name], MRN:  [id number], DOB:  [birth year], LOS: 83 ADMISSION DATE:  06/12/2022, CONSULTATION DATE:  06/13/2022 REFERRING MD:  Dr. Manuella Ghazi, [facility], CHIEF COMPLAINT:  short of breath   History of Present Illness:  77 yo female smoker with hx of COPD from emphysema found by family unresponsive and brought to ER.  Found to have acute on chronic hypercapnia on ABG.  Intubated in ER.  She was started on therapy for COPD exacerbation.  PCCM consulted to assist with respiratory management.  Pertinent  Medical History  Hypertension, Dementia, Anxiety, Depression, GERD, Primary hyperparathyroidism, Vit D deficiency, HLD, Osteoporosis  Significant Hospital Events: Including procedures, antibiotic start and stop dates in addition to other pertinent events   3/20 Admit, intubated  Studies:  3/20 Admitted after being found unresponsive, intubated on ED arrival  CT angio chest >> atherosclerosis, severe coronary calcification, severe LVH, mild centrilobular emphysema, 2 mm nodule LUL, 4 mm nodule LUL Blood culture  >> Haemophilus influenzae 3/22 Had oxygen desaturation and low Vt with SBT this morning. Echo 06/14/22 >> EF 60 to 65%, mod LVH, grade 1 DD, mild/mod AR 3/25 bronch for peak pressures, low tidal volumes with mucus cast at end of ETT, thick secretions caking tube 3/29 Bronched again with purulent secretions LLL with plugging and LLL atelectasis, some increase in O2 requirement on vent, zosyn started for VAP coverage. MRSA nare swab today negative.  3/30 Remains intubated with waxing and weaning agitation on fentanyl and precedex  3/31 Remains on vent with continued issues with agitation when sedation lightened   Interim History / Subjective:  Still with high sedation requirements.   Objective   Blood pressure (!) 88/49, pulse 67, temperature 98.4 F (36.9 C), temperature source Axillary, resp. rate (!) 21, height 5' (1.524 m), weight 48.4 kg, SpO2 94 %.    Vent Mode:  PRVC FiO2 (%):  [40 %] 40 % Set Rate:  [15 bmp-18 bmp] 15 bmp Vt Set:  [360 mL] 360 mL PEEP:  [5 cmH20] 5 cmH20 Plateau Pressure:  [16 cmH20-25 cmH20] 20 cmH20   Intake/Output Summary (Last 24 hours) at 06/24/2022 0901 Last data filed at 06/24/2022 0800 Gross per 24 hour  Intake 1991.86 ml  Output 475 ml  Net 1516.86 ml   Filed Weights   06/22/22 0500 06/23/22 0406 06/24/22 0435  Weight: 44.8 kg 47.8 kg 48.4 kg   Examination: Gen:      Intubated, sedated, acutely ill appearing HEENT:  ETT to vent Lungs:    sounds of mechanical ventilation auscultated, bilateral rhonchi, breath sounds diminished CV:         RRR Abd:      + bowel sounds; soft, non-tender; no palpable masses, no distension Ext:   Diffuse anasarca Skin:      Warm and dry; no rashes Neuro:   sedated, RASS -3  Resolved Hospital Problem list   Haemophilus influenzae bacteremia - completed 7d course ceftriaxone 3/28  Assessment & Plan:   Acute on chronic hypoxia/hypercapnic respiratory failure from  Leukocytosis - Questionable VAP - 3/30 Bronched again with purulent secretions LLL with plugging and LLL atelectasis, some increase in O2 requirement on vent, zosyn started for VAP coverage. MRSA nare swab today negative.  COPD exacerbation Tobacco abuse Lung nodule  P: Continue to wean sedation to allow for SBT, family meeting planned 4/1 to discuss plan of care including ongoing respiratory support  Continue ventilator support with lung protective strategies  Wean PEEP and FiO2 for sats  greater than 90%. Head of bed elevated 30 degrees. Plateau pressures less than 30 cm H20.  Follow intermittent chest x-ray and ABG.   SAT/SBT as tolerated, mentation preclude extubation  Ensure adequate pulmonary hygiene  Follow cultures  VAP bundle in place  PAD protocol Continue BDs Remains on Zosyn, BAL with no growth but secretions persist. Empiric 7 day course.  She is 15L positive, will start scheduled diuresis today.    Elevated troponin likely from demand ischemia. Coronary calcification on CT chest. -Preserved EF on Echo Hx of HTN  P: Pressors support appears to be driven by sedation  Continuous telemetry   Acute metabolic encephalopathy from hypercapnia. Hx of dementia, anxiety, depression P: Maintain neuro protective measures  PAD protocol Delirium precautions  Aspiration precautions   At risk malnutrition  P: Continue TF  Best Practice (right click and "Reselect all SmartList Selections" daily)   Diet/type: tubefeeds  DVT prophylaxis: LMWH GI prophylaxis: PPI Lines: N/A Foley:  Yes, and it is still needed>starting diuresis today, nursing concerned purewick will not stay in place due to agitation Code Status:  full code - need to confirm plan for reintubation and likely trach vs one way extubation with family. Plan was to discuss 3/29 but team was unable to contact family to discuss. Spoke to [facility] 3/30 with plans to met care team 4/1   Critical care time:    The patient is critically ill due to respiratory failure.  Critical care was necessary to treat or prevent imminent or life-threatening deterioration.  Critical care was time spent personally by me on the following activities: development of treatment plan with patient and/or surrogate as well as nursing, discussions with consultants, evaluation of patient's response to treatment, examination of patient, obtaining history from patient or surrogate, ordering and performing treatments and interventions, ordering and review of laboratory studies, ordering and review of radiographic studies, pulse oximetry, re-evaluation of patient's condition and participation in multidisciplinary rounds.   Critical Care Time devoted to patient care services described in this note is 40 minutes. This time reflects time of care of this [place] . This critical care time does not reflect separately billable procedures or procedure time, teaching  time or supervisory time of PA/NP/Med student/Med Resident etc but could involve care discussion time.       Spero Geralds [facility] 06/24/2022 9:01 AM  Pager: see AMION  If no response to pager , please call critical care on call (see AMION) until 7pm After 7:00 pm call Elink

## 2022-06-24 NOTE — TOC Progression Note (Addendum)
Transition of Care [facility]) - Initial/Assessment Note      Patient Details  Name: Carla [name] MRN: [id number] Date of Birth: 02/22/1946   Transition of Care [facility]) CM/SW Contact:    Ralene Bathe, LCSWA Phone Number: 06/24/2022, 12:08 PM   Clinical Narrative:                 Patient continues to be intubated.  No TOC needs or recommendations noted at this time.    TOC will continue to follow.       Patient Goals and CMS Choice        Expected Discharge Plan and Services                         Prior Living Arrangements/Services          Activities of Daily Living   Permission Sought/Granted                                                           Emotional Assessment   Admission diagnosis:  Hyperglycemia [R73.9] Acute respiratory failure with hypercapnia [J96.02] Acute respiratory failure with hypoxia and hypercapnia [J96.01, J96.02] Glasgow coma scale total score 3-8, in the field (EMT or ambulance) [R40.2431]     Patient Active Problem List    Diagnosis Date Noted   Protein-calorie malnutrition, severe 06/22/2022   Acute respiratory failure with hypoxia and hypercapnia 06/12/2022   COPD with acute exacerbation 06/12/2022   HTN (hypertension) 06/12/2022   Tobacco abuse 06/12/2022   Anxiety 06/12/2022   Peripheral vascular disease 06/12/2022   Hyperglycemia 06/12/2022   Pulmonary nodules 06/12/2022   Dementia 06/12/2022   Acute metabolic encephalopathy 06/12/2022    PCP:  Patient, [name] Pharmacy:   [facility] - [place], [place] - [address] [place] [address] Phone: [phone] Fax: [phone]         Social Determinants of Health (SDOH) Social History: SDOH Interventions:     Readmission Risk Interventions

## 2022-06-24 NOTE — TOC Progression Note (Incomplete Revision)
Transition of Care [facility]) - Initial/Assessment Note      Patient Details  Name: Carla [name] MRN: [id number] Date of Birth: 02/22/1946   Transition of Care [facility]) CM/SW Contact:    Ralene Bathe, LCSWA Phone Number: 06/24/2022, 12:08 PM   Clinical Narrative:                 Patient continues to be intubated.  No TOC needs or recommendations noted at this time.    TOC will continue to follow.       Patient Goals and CMS Choice        Expected Discharge Plan and Services                         Prior Living Arrangements/Services          Activities of Daily Living   Permission Sought/Granted                                                           Emotional Assessment   Admission diagnosis:  Hyperglycemia [R73.9] Acute respiratory failure with hypercapnia [J96.02] Acute respiratory failure with hypoxia and hypercapnia [J96.01, J96.02] Glasgow coma scale total score 3-8, in the field (EMT or ambulance) [R40.2431]     Patient Active Problem List    Diagnosis Date Noted   Protein-calorie malnutrition, severe 06/22/2022   Acute respiratory failure with hypoxia and hypercapnia 06/12/2022   COPD with acute exacerbation 06/12/2022   HTN (hypertension) 06/12/2022   Tobacco abuse 06/12/2022   Anxiety 06/12/2022   Peripheral vascular disease 06/12/2022   Hyperglycemia 06/12/2022   Pulmonary nodules 06/12/2022   Dementia 06/12/2022   Acute metabolic encephalopathy 06/12/2022    PCP:  Patient, [name] Pharmacy:   [facility] - [place], [place] - [address] [place] [address] Phone: [phone] Fax: [phone]         Social Determinants of Health (SDOH) Social History: SDOH Interventions:     Readmission Risk Interventions

## 2022-06-25 DIAGNOSIS — J9602 Acute respiratory failure with hypercapnia: Secondary | ICD-10-CM | POA: Diagnosis not present

## 2022-06-25 DIAGNOSIS — J9601 Acute respiratory failure with hypoxia: Secondary | ICD-10-CM | POA: Diagnosis not present

## 2022-06-25 LAB — CBC
HCT: 36.4 % (ref 36.0–46.0)
Hemoglobin: 10.9 g/dL — ABNORMAL LOW (ref 12.0–15.0)
MCH: 30.3 pg (ref 26.0–34.0)
MCHC: 29.9 g/dL — ABNORMAL LOW (ref 30.0–36.0)
MCV: 101.1 fL — ABNORMAL HIGH (ref 80.0–100.0)
Platelets: 369 10*3/uL (ref 150–400)
RBC: 3.6 MIL/uL — ABNORMAL LOW (ref 3.87–5.11)
RDW: 14.1 % (ref 11.5–15.5)
WBC: 14.4 10*3/uL — ABNORMAL HIGH (ref 4.0–10.5)
nRBC: 0 % (ref 0.0–0.2)

## 2022-06-25 LAB — BASIC METABOLIC PANEL
Anion gap: 11 (ref 5–15)
BUN: 28 mg/dL — ABNORMAL HIGH (ref 8–23)
CO2: 37 mmol/L — ABNORMAL HIGH (ref 22–32)
Calcium: 8.9 mg/dL (ref 8.9–10.3)
Chloride: 96 mmol/L — ABNORMAL LOW (ref 98–111)
Creatinine, Ser: 0.93 mg/dL (ref 0.44–1.00)
GFR, Estimated: >60 mL/min (ref >60)
Glucose, Bld: 123 mg/dL — ABNORMAL HIGH (ref 70–99)
Potassium: 3.5 mmol/L (ref 3.5–5.1)
Sodium: 144 mmol/L (ref 135–145)

## 2022-06-25 LAB — GLUCOSE, CAPILLARY
Glucose-Capillary: 221 mg/dL — ABNORMAL HIGH (ref 70–99)
Glucose-Capillary: 200 mg/dL — ABNORMAL HIGH (ref 70–99)
Glucose-Capillary: 153 mg/dL — ABNORMAL HIGH (ref 70–99)
Glucose-Capillary: 110 mg/dL — ABNORMAL HIGH (ref 70–99)
Glucose-Capillary: 178 mg/dL — ABNORMAL HIGH (ref 70–99)
Glucose-Capillary: 197 mg/dL — ABNORMAL HIGH (ref 70–99)

## 2022-06-25 LAB — CULTURE, BLOOD (ROUTINE X 2): Special Requests: ADEQUATE

## 2022-06-25 LAB — MAGNESIUM: Magnesium: 2.2 mg/dL (ref 1.7–2.4)

## 2022-06-25 LAB — PHOSPHORUS: Phosphorus: 3.3 mg/dL (ref 2.5–4.6)

## 2022-06-25 MED ORDER — CLONAZEPAM 1 MG PO TABS
1.0000 mg | ORAL_TABLET | Freq: Two times a day (BID) | ORAL | Status: DC
  Administered 2022-06-25 – 2022-07-11 (×32): 1 mg
  Filled 2022-06-25 (×33): qty 1

## 2022-06-25 MED ORDER — FUROSEMIDE 10 MG/ML IJ SOLN
40.0000 mg | Freq: Three times a day (TID) | INTRAMUSCULAR | Status: DC
  Administered 2022-06-25 (×2): 40 mg via INTRAVENOUS
  Filled 2022-06-25 (×2): qty 4

## 2022-06-25 MED ORDER — POTASSIUM CHLORIDE 20 MEQ PO PACK
40.0000 meq | PACK | Freq: Once | ORAL | Status: AC
  Administered 2022-06-25: 40 meq
  Filled 2022-06-25: qty 2

## 2022-06-25 MED ORDER — INSULIN ASPART 100 UNIT/ML IJ SOLN
2.0000 [IU] | INTRAMUSCULAR | Status: DC
  Administered 2022-06-25 – 2022-07-09 (×80): 2 [IU] via SUBCUTANEOUS

## 2022-06-25 NOTE — Progress Notes (Addendum)
NAME:  DOLORIS JUSTIS, MRN:  [id number], DOB:  30-Jun-1945, LOS: 38 ADMISSION DATE:  06/12/2022, CONSULTATION DATE:  06/13/2022 REFERRING MD:  Dr. Manuella Ghazi, [facility], CHIEF COMPLAINT:  short of breath   History of Present Illness:  77 yo female smoker with hx of COPD from emphysema found by family unresponsive and brought to ER.  Found to have acute on chronic hypercapnia on ABG.  Intubated in ER.  She was started on therapy for COPD exacerbation.  PCCM consulted to assist with respiratory management.  Pertinent  Medical History  Hypertension, Dementia, Anxiety, Depression, GERD, Primary hyperparathyroidism, Vit D deficiency, HLD, Osteoporosis  Significant Hospital Events: Including procedures, antibiotic start and stop dates in addition to other pertinent events   3/20 Admit, intubated  Studies:  3/20 Admitted after being found unresponsive, intubated on ED arrival  CT angio chest >> atherosclerosis, severe coronary calcification, severe LVH, mild centrilobular emphysema, 2 mm nodule LUL, 4 mm nodule LUL Blood culture  >> Haemophilus influenzae 3/22 Had oxygen desaturation and low Vt with SBT this morning. Echo 06/14/22 >> EF 60 to 65%, mod LVH, grade 1 DD, mild/mod AR 3/25 bronch for peak pressures, low tidal volumes with mucus cast at end of ETT, thick secretions caking tube 3/29 Bronched again with purulent secretions LLL with plugging and LLL atelectasis, some increase in O2 requirement on vent, zosyn started for VAP coverage. MRSA nare swab today negative.  3/30 Remains intubated with waxing and weaning agitation on fentanyl and precedex  3/31 Remains on vent with continued issues with agitation when sedation lightened   Interim History / Subjective:  Failing PS trial due to tachypnea.  Copious secretions  Objective   Blood pressure (!) 110/51, pulse 66, temperature 97.9 F (36.6 C), temperature source Axillary, resp. rate (!) 28, height 5' (1.524 m), weight 47.5 kg, SpO2 96 %.     Vent Mode: PRVC FiO2 (%):  [40 %] 40 % Set Rate:  [15 bmp] 15 bmp Vt Set:  [360 mL] 360 mL PEEP:  [5 cmH20] 5 cmH20 Plateau Pressure:  [12 [id number] cmH20] 12 cmH20   Intake/Output Summary (Last 24 hours) at 06/25/2022 1032 Last data filed at 06/25/2022 1016 Gross per 24 hour  Intake 2164.12 ml  Output 2625 ml  Net -460.88 ml   Filed Weights   06/23/22 0406 06/24/22 0435 06/25/22 0500  Weight: 47.8 kg 48.4 kg 47.5 kg   Examination: Gen:      Intubated, sedated, acutely and chronically ill appearing HEENT:  ETT to vent Lungs:    sounds of mechanical ventilation auscultated diminished, no wheeze CV:         RRR no mrg Abd:      + bowel sounds; soft, non-tender; no palpable masses, no distension Ext:    Bilateral lower extremity edema Skin:      Warm and dry; no rashes Neuro:   sedated, RASS -3    Resolved Hospital Problem list   Haemophilus influenzae bacteremia - completed 7d course ceftriaxone 3/28  Assessment & Plan:   Acute on chronic hypoxia/hypercapnic respiratory failure from  Leukocytosis - Questionable VAP - 3/30 Bronched again with purulent secretions LLL with plugging and LLL atelectasis, some increase in O2 requirement on vent, zosyn started for VAP coverage. MRSA nare swab today negative.  COPD exacerbation Tobacco abuse Lung nodule  P: Continue to wean sedation to allow for SBT, family meeting planned 4/1 to discuss plan of care including ongoing respiratory support  Continue ventilator support with lung  protective strategies  Wean PEEP and FiO2 for sats greater than 90%. Head of bed elevated 30 degrees. Plateau pressures less than 30 cm H20.  Follow intermittent chest x-ray and ABG.   SAT/SBT as tolerated, mentation preclude extubation  Ensure adequate pulmonary hygiene  Follow cultures  VAP bundle in place  PAD protocol Continue BDs Remains on Zosyn, BAL with no growth but secretions persist. Empiric 7 day course.  Increase lasix today for net negative  fluid balance  Elevated troponin likely from demand ischemia. Coronary calcification on CT chest. -Preserved EF on Echo Hx of HTN  P: Pressors support appears to be driven by sedation  Continuous telemetry   Acute metabolic encephalopathy from hypercapnia. Hx of dementia, anxiety, depression P: Maintain neuro protective measures  PAD protocol Delirium precautions  Aspiration precautions   At risk malnutrition  P: Continue TF  Best Practice (right click and "Reselect all SmartList Selections" daily)   Diet/type: tubefeeds  DVT prophylaxis: LMWH GI prophylaxis: PPI Lines: N/A Foley:  Yes, and it is still needed>starting diuresis today, nursing concerned purewick will not stay in place due to agitation Code Status:  full code - family meeting this afternoon to discuss goals of care.    Critical care time:    The patient is critically ill due to respiratory failure.  Critical care was necessary to treat or prevent imminent or life-threatening deterioration.  Critical care was time spent personally by me on the following activities: development of treatment plan with patient and/or surrogate as well as nursing, discussions with consultants, evaluation of patient's response to treatment, examination of patient, obtaining history from patient or surrogate, ordering and performing treatments and interventions, ordering and review of laboratory studies, ordering and review of radiographic studies, pulse oximetry, re-evaluation of patient's condition and participation in multidisciplinary rounds.   Critical Care Time devoted to patient care services described in this note is 45 minutes. This time reflects time of care of this [place] . This critical care time does not reflect separately billable procedures or procedure time, teaching time or supervisory time of PA/NP/Med student/Med Resident etc but could involve care discussion time.       Spero Geralds [facility] 06/25/2022 10:32 AM  Pager: see AMION  If no response to pager , please call critical care on call (see AMION) until 7pm After 7:00 pm call Elink

## 2022-06-25 NOTE — Progress Notes (Incomplete Revision)
eLink Physician-Brief Progress Note Patient Name: Adreona C Buntin DOB: 05/14/1945 MRN: [id number]   Date of Service  06/25/2022  HPI/Events of Note  Diarrhea - Rectal pouch has leaked several times. Nursing request for Flexiseal.   eICU Interventions  Plan: Place Flexiseal.     Intervention Category Major Interventions: Other:  Bernard Donahoo [place] 06/25/2022, 11:44 PM 

## 2022-06-25 NOTE — Progress Notes (Addendum)
eLink Physician-Brief Progress Note Patient Name: Carla [name] DOB: 05/14/1945 MRN: [id number]   Date of Service  06/25/2022  HPI/Events of Note  Diarrhea - Rectal pouch has leaked several times. Nursing request for Flexiseal.   eICU Interventions  Plan: Place Flexiseal.     Intervention Category Major Interventions: Other:  Bernard Donahoo [place] 06/25/2022, 11:44 PM 

## 2022-06-26 DIAGNOSIS — J9602 Acute respiratory failure with hypercapnia: Secondary | ICD-10-CM | POA: Diagnosis not present

## 2022-06-26 LAB — GLUCOSE, CAPILLARY
Glucose-Capillary: 95 mg/dL (ref 70–99)
Glucose-Capillary: 191 mg/dL — ABNORMAL HIGH (ref 70–99)
Glucose-Capillary: 196 mg/dL — ABNORMAL HIGH (ref 70–99)
Glucose-Capillary: 120 mg/dL — ABNORMAL HIGH (ref 70–99)
Glucose-Capillary: 156 mg/dL — ABNORMAL HIGH (ref 70–99)
Glucose-Capillary: 169 mg/dL — ABNORMAL HIGH (ref 70–99)

## 2022-06-26 LAB — RENAL FUNCTION PANEL
Albumin: 1.8 g/dL — ABNORMAL LOW (ref 3.5–5.0)
Anion gap: 10 (ref 5–15)
BUN: 30 mg/dL — ABNORMAL HIGH (ref 8–23)
CO2: 40 mmol/L — ABNORMAL HIGH (ref 22–32)
Calcium: 9.2 mg/dL (ref 8.9–10.3)
Chloride: 97 mmol/L — ABNORMAL LOW (ref 98–111)
Creatinine, Ser: 1.05 mg/dL — ABNORMAL HIGH (ref 0.44–1.00)
GFR, Estimated: 55 mL/min — ABNORMAL LOW (ref >60)
Glucose, Bld: 164 mg/dL — ABNORMAL HIGH (ref 70–99)
Phosphorus: 3.1 mg/dL (ref 2.5–4.6)
Potassium: 3.8 mmol/L (ref 3.5–5.1)
Sodium: 147 mmol/L — ABNORMAL HIGH (ref 135–145)

## 2022-06-26 LAB — CULTURE, BLOOD (ROUTINE X 2)
Culture: NO GROWTH
Culture: NO GROWTH
Special Requests: ADEQUATE
Special Requests: ADEQUATE

## 2022-06-26 LAB — BASIC METABOLIC PANEL
Anion gap: 8 (ref 5–15)
BUN: 29 mg/dL — ABNORMAL HIGH (ref 8–23)
CO2: 38 mmol/L — ABNORMAL HIGH (ref 22–32)
Calcium: 8.7 mg/dL — ABNORMAL LOW (ref 8.9–10.3)
Chloride: 96 mmol/L — ABNORMAL LOW (ref 98–111)
Creatinine, Ser: 0.98 mg/dL (ref 0.44–1.00)
GFR, Estimated: 59 mL/min — ABNORMAL LOW (ref >60)
Glucose, Bld: 150 mg/dL — ABNORMAL HIGH (ref 70–99)
Potassium: 3.3 mmol/L — ABNORMAL LOW (ref 3.5–5.1)
Sodium: 142 mmol/L (ref 135–145)

## 2022-06-26 LAB — MAGNESIUM: Magnesium: 2.4 mg/dL (ref 1.7–2.4)

## 2022-06-26 MED ORDER — POTASSIUM CHLORIDE 20 MEQ PO PACK
80.0000 meq | PACK | Freq: Once | ORAL | Status: AC
  Administered 2022-06-26: 80 meq
  Filled 2022-06-26: qty 4

## 2022-06-26 MED ORDER — FUROSEMIDE 10 MG/ML IJ SOLN
80.0000 mg | Freq: Three times a day (TID) | INTRAMUSCULAR | Status: DC
  Administered 2022-06-26 – 2022-06-28 (×9): 80 mg via INTRAVENOUS
  Filled 2022-06-26 (×8): qty 8

## 2022-06-26 NOTE — Progress Notes (Addendum)
NAME:  Carla [name], MRN:  [id number], DOB:  Aug 18, 1945, LOS: 43 ADMISSION DATE:  06/12/2022, CONSULTATION DATE:  06/13/2022 REFERRING MD:  Dr. Manuella Ghazi, [facility], CHIEF COMPLAINT:  short of breath   History of Present Illness:  77 yo female smoker with hx of COPD from emphysema found by family unresponsive and brought to ER.  Found to have acute on chronic hypercapnia on ABG.  Intubated in ER.  She was started on therapy for COPD exacerbation.  PCCM consulted to assist with respiratory management.  Pertinent  Medical History  Hypertension, Dementia, Anxiety, Depression, GERD, Primary hyperparathyroidism, Vit D deficiency, HLD, Osteoporosis  Significant Hospital Events: Including procedures, antibiotic start and stop dates in addition to other pertinent events   3/20 Admit, intubated  Studies:  3/20 Admitted after being found unresponsive, intubated on ED arrival  CT angio chest >> atherosclerosis, severe coronary calcification, severe LVH, mild centrilobular emphysema, 2 mm nodule LUL, 4 mm nodule LUL Blood culture  >> Haemophilus influenzae 3/22 Had oxygen desaturation and low Vt with SBT this morning. Echo 06/14/22 >> EF 60 to 65%, mod LVH, grade 1 DD, mild/mod AR 3/25 bronch for peak pressures, low tidal volumes with mucus cast at end of ETT, thick secretions caking tube 3/29 Bronched again with purulent secretions LLL with plugging and LLL atelectasis, some increase in O2 requirement on vent, zosyn started for VAP coverage. MRSA nare swab today negative.  3/30 Remains intubated with waxing and weaning agitation on fentanyl and precedex  3/31 Remains on vent with continued issues with agitation when sedation lightened  4/2 family meeting, made DNR, plans for one way extubation 4/5  Interim History / Subjective:  Family meeting yesterday. Patient would not desire tracheostomy.  Modest response to lasix over last 24 hours, not negative enough.   Objective   Blood pressure 120/60, pulse  70, temperature 97.6 F (36.4 C), temperature source Axillary, resp. rate (!) 25, height 5' (1.524 m), weight 45.4 kg, SpO2 93 %.    Vent Mode: PRVC FiO2 (%):  [40 %] 40 % Set Rate:  [15 bmp] 15 bmp Vt Set:  [360 mL] 360 mL PEEP:  [5 cmH20] 5 cmH20 Plateau Pressure:  [14 cmH20-21 cmH20] 16 cmH20   Intake/Output Summary (Last 24 hours) at 06/26/2022 0913 Last data filed at 06/26/2022 0700 Gross per 24 hour  Intake 1819.6 ml  Output 2075 ml  Net -255.4 ml   Filed Weights   06/24/22 0435 06/25/22 0500 06/26/22 0500  Weight: 48.4 kg 47.5 kg 45.4 kg   Examination: Gen:      Intubated, sedated, acutely and chronically ill appearing HEENT:  ETT to vent, thick secretions Lungs:    sounds of mechanical ventilation auscultated, diminished, no rhonchi or wheezes CV:         RRR Abd:      + bowel sounds; soft, non-tender; no palpable masses, no distension Ext:   Bilateral lower extermity edema Skin:      Warm and dry; no rashes Neuro:   sedated, RASS -3     Resolved Hospital Problem list   Haemophilus influenzae bacteremia - completed 7d course ceftriaxone 3/28  Assessment & Plan:   Acute on chronic hypoxia/hypercapnic respiratory failure from  Leukocytosis - Questionable VAP - 3/30 Bronched again with purulent secretions LLL with plugging and LLL atelectasis, some increase in O2 requirement on vent, zosyn started for VAP coverage. MRSA nare swab today negative.  COPD exacerbation Tobacco abuse Lung nodule  P: Continue ventilator support  with lung protective strategies  Wean PEEP and FiO2 for sats greater than 90%. Head of bed elevated 30 degrees. Plateau pressures less than 30 cm H20.  Follow intermittent chest x-ray and ABG.   SAT/SBT as tolerated Ensure adequate pulmonary hygiene  Follow cultures  VAP bundle in place  PAD protocol Continue BDs Remains on Zosyn, BAL with no growth but secretions persist. Empiric 7 day course to end 4/5.  Did not respond enough to lasix,  will increase dose to 80 mg TID.  Failing SBT for tachypnea. Will try again today.   Elevated troponin likely from demand ischemia. Coronary calcification on CT chest. Circulatory shock, secondary to sedation needs -Preserved EF on Echo Hx of HTN  P: Pressors support appears to be driven by sedation  Continuous telemetry   Acute metabolic encephalopathy from hypercapnia. Hx of dementia, anxiety, depression P: Maintain neuro protective measures  PAD protocol Delirium precautions  Aspiration precautions   At risk malnutrition  P: Continue TF  Hypokalemia - replete and recheck  Best Practice (right click and "Reselect all SmartList Selections" daily)   Diet/type: tubefeeds  DVT prophylaxis: LMWH GI prophylaxis: PPI Lines: N/A Foley:  Yes, and it is still needed Code Status:  DNR - plan for 1 way extubation 4/5. No trach.    Critical care time:    The patient is critically ill due to respiratory failure.  Critical care was necessary to treat or prevent imminent or life-threatening deterioration.  Critical care was time spent personally by me on the following activities: development of treatment plan with patient and/or surrogate as well as nursing, discussions with consultants, evaluation of patient's response to treatment, examination of patient, obtaining history from patient or surrogate, ordering and performing treatments and interventions, ordering and review of laboratory studies, ordering and review of radiographic studies, pulse oximetry, re-evaluation of patient's condition and participation in multidisciplinary rounds.   Critical Care Time devoted to patient care services described in this note is 40 minutes. This time reflects time of care of this [place] . This critical care time does not reflect separately billable procedures or procedure time, teaching time or supervisory time of PA/NP/Med student/Med Resident etc but could involve care discussion  time.       Spero Geralds [facility] 06/26/2022 9:13 AM  Pager: see AMION  If no response to pager , please call critical care on call (see AMION) until 7pm After 7:00 pm call Elink

## 2022-06-27 LAB — CBC WITH DIFFERENTIAL/PLATELET
Abs Immature Granulocytes: 0.05 10*3/uL (ref 0.00–0.07)
Basophils Absolute: 0 10*3/uL (ref 0.0–0.1)
Basophils Relative: 0 %
Eosinophils Absolute: 0.2 10*3/uL (ref 0.0–0.5)
Eosinophils Relative: 2 %
HCT: 34.7 % — ABNORMAL LOW (ref 36.0–46.0)
Hemoglobin: 11 g/dL — ABNORMAL LOW (ref 12.0–15.0)
Immature Granulocytes: 0 %
Lymphocytes Relative: 9 %
Lymphs Abs: 1.1 10*3/uL (ref 0.7–4.0)
MCH: 31.2 pg (ref 26.0–34.0)
MCHC: 31.7 g/dL (ref 30.0–36.0)
MCV: 98.3 fL (ref 80.0–100.0)
Monocytes Absolute: 0.7 10*3/uL (ref 0.1–1.0)
Monocytes Relative: 5 %
Neutro Abs: 10.1 10*3/uL — ABNORMAL HIGH (ref 1.7–7.7)
Neutrophils Relative %: 84 %
Platelets: 404 10*3/uL — ABNORMAL HIGH (ref 150–400)
RBC: 3.53 MIL/uL — ABNORMAL LOW (ref 3.87–5.11)
RDW: 14.6 % (ref 11.5–15.5)
WBC: 12.1 10*3/uL — ABNORMAL HIGH (ref 4.0–10.5)
nRBC: 0 % (ref 0.0–0.2)

## 2022-06-27 LAB — GLUCOSE, CAPILLARY
Glucose-Capillary: 128 mg/dL — ABNORMAL HIGH (ref 70–99)
Glucose-Capillary: 192 mg/dL — ABNORMAL HIGH (ref 70–99)
Glucose-Capillary: 241 mg/dL — ABNORMAL HIGH (ref 70–99)
Glucose-Capillary: 134 mg/dL — ABNORMAL HIGH (ref 70–99)
Glucose-Capillary: 202 mg/dL — ABNORMAL HIGH (ref 70–99)
Glucose-Capillary: 142 mg/dL — ABNORMAL HIGH (ref 70–99)

## 2022-06-27 LAB — BASIC METABOLIC PANEL
Anion gap: 6 (ref 5–15)
BUN: 36 mg/dL — ABNORMAL HIGH (ref 8–23)
CO2: 37 mmol/L — ABNORMAL HIGH (ref 22–32)
Calcium: 8.7 mg/dL — ABNORMAL LOW (ref 8.9–10.3)
Chloride: 98 mmol/L (ref 98–111)
Creatinine, Ser: 1.15 mg/dL — ABNORMAL HIGH (ref 0.44–1.00)
GFR, Estimated: 49 mL/min — ABNORMAL LOW (ref >60)
Glucose, Bld: 205 mg/dL — ABNORMAL HIGH (ref 70–99)
Potassium: 3.8 mmol/L (ref 3.5–5.1)
Sodium: 141 mmol/L (ref 135–145)

## 2022-06-27 LAB — MAGNESIUM: Magnesium: 2.5 mg/dL — ABNORMAL HIGH (ref 1.7–2.4)

## 2022-06-27 LAB — PHOSPHORUS: Phosphorus: 3.3 mg/dL (ref 2.5–4.6)

## 2022-06-27 NOTE — TOC Progression Note (Incomplete Revision)
Transition of Care [facility]) - Initial/Assessment Note      Patient Details  Name: LUNDIN KOPELMAN MRN: [id number] Date of Birth: 08-19-1945   Transition of [facility] CM/SW Contact:    Ralene Bathe, LCSWA Phone Number: 06/27/2022, 10:45 AM   Clinical Narrative:                 Transition of Care Department [facility]) has reviewed patient and no TOC needs have been identified at this time. Plan is for one way extubation tomorrow.  We will continue to monitor patient advancement through interdisciplinary progression rounds. If new patient transition needs arise, please place a TOC consult.         Patient Goals and CMS Choice        Expected Discharge Plan and Services                         Prior Living Arrangements/Services          Activities of Daily Living   Permission Sought/Granted                                                           Emotional Assessment   Admission diagnosis:  Hyperglycemia [R73.9] Acute respiratory failure with hypercapnia [J96.02] Acute respiratory failure with hypoxia and hypercapnia [J96.01, J96.02] Glasgow coma scale total score 3-8, in the field (EMT or ambulance) [R40.2431]     Patient Active Problem List    Diagnosis Date Noted   Protein-calorie malnutrition, severe 06/22/2022   Acute respiratory failure with hypercapnia 06/12/2022   COPD with acute exacerbation 06/12/2022   HTN (hypertension) 06/12/2022   Tobacco abuse 06/12/2022   Anxiety 06/12/2022   Peripheral vascular disease 06/12/2022   Hyperglycemia 06/12/2022   Pulmonary nodules 06/12/2022   Dementia 06/12/2022   Acute metabolic encephalopathy 06/12/2022    PCP:  Patient, [name] Pharmacy:   [facility] - [place], [place] - [address] [place] [address] Phone: [phone] Fax: [phone]         Social Determinants of Health (SDOH) Social History: SDOH Interventions:     Readmission Risk Interventions

## 2022-06-27 NOTE — Progress Notes (Addendum)
eLink Physician-Brief Progress Note Patient Name: Miquel C Nest DOB: 06/27/1945 MRN: [id number]   Date of Service  06/27/2022  HPI/Events of Note  Nursing request for AM labs.   eICU Interventions  Plan: CBC with platelets, BMP, Mg++ and phosphorus now.      Intervention Category Major Interventions: Other:  Loralyn Rachel Eugene 06/27/2022, 5:11 AM 

## 2022-06-27 NOTE — Progress Notes (Incomplete Revision)
eLink Physician-Brief Progress Note Patient Name: Miquel C Nest DOB: 06/27/1945 MRN: [id number]   Date of Service  06/27/2022  HPI/Events of Note  Nursing request for AM labs.   eICU Interventions  Plan: CBC with platelets, BMP, Mg++ and phosphorus now.      Intervention Category Major Interventions: Other:  Loralyn Rachel Eugene 06/27/2022, 5:11 AM 

## 2022-06-27 NOTE — TOC Progression Note (Addendum)
Transition of Care [facility]) - Initial/Assessment Note      Patient Details  Name: Carla [name] MRN: [id number] Date of Birth: 08-19-1945   Transition of Care [facility]) CM/SW Contact:    Ralene Bathe, LCSWA Phone Number: 06/27/2022, 10:45 AM   Clinical Narrative:                 Transition of Care Department [facility]) has reviewed patient and no TOC needs have been identified at this time. Plan is for one way extubation tomorrow.  We will continue to monitor patient advancement through interdisciplinary progression rounds. If new patient transition needs arise, please place a TOC consult.         Patient Goals and CMS Choice        Expected Discharge Plan and Services                         Prior Living Arrangements/Services          Activities of Daily Living   Permission Sought/Granted                                                           Emotional Assessment   Admission diagnosis:  Hyperglycemia [R73.9] Acute respiratory failure with hypercapnia [J96.02] Acute respiratory failure with hypoxia and hypercapnia [J96.01, J96.02] Glasgow coma scale total score 3-8, in the field (EMT or ambulance) [R40.2431]     Patient Active Problem List    Diagnosis Date Noted   Protein-calorie malnutrition, severe 06/22/2022   Acute respiratory failure with hypercapnia 06/12/2022   COPD with acute exacerbation 06/12/2022   HTN (hypertension) 06/12/2022   Tobacco abuse 06/12/2022   Anxiety 06/12/2022   Peripheral vascular disease 06/12/2022   Hyperglycemia 06/12/2022   Pulmonary nodules 06/12/2022   Dementia 06/12/2022   Acute metabolic encephalopathy 06/12/2022    PCP:  Patient, [name] Pharmacy:   [facility] - [place], [place] - [address] [place] [address] Phone: [phone] Fax: [phone]         Social Determinants of Health (SDOH) Social History: SDOH Interventions:     Readmission Risk Interventions

## 2022-06-28 DIAGNOSIS — J9602 Acute respiratory failure with hypercapnia: Secondary | ICD-10-CM | POA: Diagnosis not present

## 2022-06-28 LAB — GLUCOSE, CAPILLARY
Glucose-Capillary: 105 mg/dL — ABNORMAL HIGH (ref 70–99)
Glucose-Capillary: 162 mg/dL — ABNORMAL HIGH (ref 70–99)
Glucose-Capillary: 162 mg/dL — ABNORMAL HIGH (ref 70–99)
Glucose-Capillary: 173 mg/dL — ABNORMAL HIGH (ref 70–99)
Glucose-Capillary: 190 mg/dL — ABNORMAL HIGH (ref 70–99)
Glucose-Capillary: 239 mg/dL — ABNORMAL HIGH (ref 70–99)

## 2022-06-28 NOTE — Progress Notes (Addendum)
NAME:  Carla [name], MRN:  [id number], DOB:  11-28-1945, LOS: 16 ADMISSION DATE:  06/12/2022, CONSULTATION DATE:  06/13/2022 REFERRING MD:  Dr. Sherryll Burger, [facility], CHIEF COMPLAINT:  short of breath   History of Present Illness:  77 yo female smoker with hx of COPD from emphysema found by family unresponsive and brought to ER.  Found to have acute on chronic hypercapnia on ABG.  Intubated in ER.  She was started on therapy for COPD exacerbation.  PCCM consulted to assist with respiratory management.  Pertinent  Medical History  Hypertension, Dementia, Anxiety, Depression, GERD, Primary hyperparathyroidism, Vit D deficiency, HLD, Osteoporosis  Significant Hospital Events: Including procedures, antibiotic start and stop dates in addition to other pertinent events   3/20 Admit, intubated  Studies:  3/20 Admitted after being found unresponsive, intubated on ED arrival  CT angio chest >> atherosclerosis, severe coronary calcification, severe LVH, mild centrilobular emphysema, 2 mm nodule LUL, 4 mm nodule LUL Blood culture  >> Haemophilus influenzae 3/22 Had oxygen desaturation and low Vt with SBT this morning. Echo 06/14/22 >> EF 60 to 65%, mod LVH, grade 1 DD, mild/mod AR 3/25 bronch for peak pressures, low tidal volumes with mucus cast at end of ETT, thick secretions caking tube 3/29 Bronched again with purulent secretions LLL with plugging and LLL atelectasis, some increase in O2 requirement on vent, zosyn started for VAP coverage. MRSA nare swab today negative.  3/30 Remains intubated with waxing and weaning agitation on fentanyl and precedex  3/31 Remains on vent with continued issues with agitation when sedation lightened  4/2 family meeting, made DNR, plans for one way extubation 4/5 4/5-no overnight events, plan is for one-way extubation today   Interim History / Subjective:   Spoke with son over the phone today Plan is to have family coming to spend time with patient today at about  2:00 Will plan for one-way extubation  Objective   Blood pressure (!) 119/57, pulse 70, temperature 97.9 F (36.6 C), temperature source Axillary, resp. rate 20, height 5' (1.524 m), weight 42.3 kg, SpO2 98 %.    Vent Mode: PRVC FiO2 (%):  [40 %-50 %] 40 % Set Rate:  [15 bmp] 15 bmp Vt Set:  [360 mL] 360 mL PEEP:  [5 cmH20] 5 cmH20 Plateau Pressure:  [15 cmH20-23 cmH20] 20 cmH20   Intake/Output Summary (Last 24 hours) at 06/28/2022 0919 Last data filed at 06/28/2022 0700 Gross per 24 hour  Intake 1912.66 ml  Output 3075 ml  Net -1162.34 ml   Filed Weights   06/26/22 0500 06/27/22 0500 06/28/22 0429  Weight: 45.4 kg 45.4 kg 42.3 kg   Examination: Gen:   Chronically ill-appearing, endotracheal tube in place HEENT: ET tube in place Lungs:    Poor air movement bilaterally, no wheezes, no rhonchi CV:         S1-S2 appreciated Abd:   Bowel sounds appreciated Ext: Bilateral lower extremity edema Skin:      Warm and dry; no rashes Neuro:   sedated, RASS -3  Resolved Hospital Problem list   Haemophilus influenzae bacteremia - completed 7d course ceftriaxone 3/28  Assessment & Plan:   Acute on chronic hypoxemic/hypercapnic respiratory failure S/p multiple bronchoscopy for mucous plugging -On empiric antibiotic for possible VAP Underlying chronic obstructive pulmonary disease with exacerbation Tobacco abuse Lung nodule -Continue ventilator support -Keep head of bed elevated at 30 degrees -Plateau pressure less than 30 -Pulmonary hygiene -Continue bronchodilators -Empiric antibiotics-today will be day 5 -She is failing  SBT's  History of hypertension Demand ischemia Circulatory shock -Continue monitoring -Ejection fraction is preserved  Metabolic encephalopathy History of dementia, anxiety, depression -Delirium precautions -Aspiration precautions  At risk for malnutrition -Continuing on tube feeds  Did touch base with patient's son today Goal will be one-way  extubation at about 1400 hrs. today As patient has not been weaning very well Likely trajectory is that this is an end-stage disease process, in-hospital demise expected  Best Practice (right click and "Reselect all SmartList Selections" daily)   Diet/type: tubefeeds  DVT prophylaxis: LMWH GI prophylaxis: PPI Lines: N/A Foley:  Yes, and it is still needed Code Status:  DNR - plan for 1 way extubation 4/5. No trach.    Critical care time:    The patient is critically ill with multiple organ systems failure and requires high complexity decision making for assessment and support, frequent evaluation and titration of therapies, application of advanced monitoring technologies and extensive interpretation of multiple databases. Critical Care Time devoted to patient care services described in this note independent of APP/resident time (if applicable)  is 35 minutes.   Virl DiamondAdewale Scharlene Catalina MD  Pulmonary Critical Care Personal pager: See Amion If unanswered, please page CCM On-call: [phone]

## 2022-06-29 DIAGNOSIS — J9602 Acute respiratory failure with hypercapnia: Secondary | ICD-10-CM | POA: Diagnosis not present

## 2022-06-29 LAB — BASIC METABOLIC PANEL
Anion gap: 11 (ref 5–15)
BUN: 52 mg/dL — ABNORMAL HIGH (ref 8–23)
CO2: 39 mmol/L — ABNORMAL HIGH (ref 22–32)
Calcium: 9.3 mg/dL (ref 8.9–10.3)
Chloride: 94 mmol/L — ABNORMAL LOW (ref 98–111)
Creatinine, Ser: 1.33 mg/dL — ABNORMAL HIGH (ref 0.44–1.00)
GFR, Estimated: 41 mL/min — ABNORMAL LOW (ref >60)
Glucose, Bld: 192 mg/dL — ABNORMAL HIGH (ref 70–99)
Potassium: 3.4 mmol/L — ABNORMAL LOW (ref 3.5–5.1)
Sodium: 144 mmol/L (ref 135–145)

## 2022-06-29 LAB — GLUCOSE, CAPILLARY
Glucose-Capillary: 120 mg/dL — ABNORMAL HIGH (ref 70–99)
Glucose-Capillary: 148 mg/dL — ABNORMAL HIGH (ref 70–99)
Glucose-Capillary: 148 mg/dL — ABNORMAL HIGH (ref 70–99)
Glucose-Capillary: 163 mg/dL — ABNORMAL HIGH (ref 70–99)
Glucose-Capillary: 179 mg/dL — ABNORMAL HIGH (ref 70–99)

## 2022-06-29 MED ORDER — MIDODRINE HCL 5 MG PO TABS
5.0000 mg | ORAL_TABLET | Freq: Three times a day (TID) | ORAL | Status: DC
  Administered 2022-06-29 – 2022-07-04 (×15): 5 mg
  Filled 2022-06-29 (×15): qty 1

## 2022-06-29 MED ORDER — FUROSEMIDE 10 MG/ML IJ SOLN
80.0000 mg | Freq: Two times a day (BID) | INTRAMUSCULAR | Status: DC
  Administered 2022-06-29 – 2022-07-02 (×7): 80 mg via INTRAVENOUS
  Filled 2022-06-29 (×6): qty 8

## 2022-06-29 MED ORDER — POTASSIUM CHLORIDE 20 MEQ PO PACK
60.0000 meq | PACK | Freq: Once | ORAL | Status: AC
  Administered 2022-06-29: 60 meq
  Filled 2022-06-29: qty 3

## 2022-06-29 MED ORDER — NUTRISOURCE FIBER PO PACK
1.0000 | PACK | Freq: Two times a day (BID) | ORAL | Status: DC
  Administered 2022-06-29 – 2022-07-02 (×7): 1
  Filled 2022-06-29 (×7): qty 1

## 2022-06-29 MED ORDER — OXYCODONE HCL 5 MG PO TABS
5.0000 mg | ORAL_TABLET | Freq: Four times a day (QID) | ORAL | Status: DC
  Administered 2022-06-29 – 2022-06-30 (×5): 5 mg
  Filled 2022-06-29 (×4): qty 1

## 2022-06-29 NOTE — Progress Notes (Addendum)
eLink Physician-Brief Progress Note Patient Name: Carla [name] DOB: Jul 25, 1945 MRN: [id number]   Date of Service  06/29/2022  HPI/Events of Note  77 year old COPD was found to be hypercapnic in the setting of COPD exacerbation.  Intubated, anticipating change to tracheostomy on 4/8.  Reporting increased secretions from ET tube, discoloration of tan/yellow  eICU Interventions  Send respiratory culture     Intervention Category Minor Interventions: Clinical assessment - ordering diagnostic tests  Nagi Furio 06/29/2022, 10:21 PM

## 2022-06-29 NOTE — Progress Notes (Addendum)
NAME:  Carla [name], MRN:  [id number], DOB:  1945-12-20, LOS: 17 ADMISSION DATE:  06/12/2022, CONSULTATION DATE:  06/13/2022 REFERRING MD:  Dr. Sherryll Burger, [facility], CHIEF COMPLAINT:  short of breath   History of Present Illness:  77 yo female smoker with hx of COPD from emphysema found by family unresponsive and brought to ER.  Found to have acute on chronic hypercapnia on ABG.  Intubated in ER.  She was started on therapy for COPD exacerbation.  PCCM consulted to assist with respiratory management.  Pertinent  Medical History  Hypertension, Dementia, Anxiety, Depression, GERD, Primary hyperparathyroidism, Vit D deficiency, HLD, Osteoporosis  Significant Hospital Events: Including procedures, antibiotic start and stop dates in addition to other pertinent events   3/20 Admit, intubated  Studies:  3/20 Admitted after being found unresponsive, intubated on ED arrival  CT angio chest >> atherosclerosis, severe coronary calcification, severe LVH, mild centrilobular emphysema, 2 mm nodule LUL, 4 mm nodule LUL Blood culture  >> Haemophilus influenzae 3/22 Had oxygen desaturation and low Vt with SBT this morning. Echo 06/14/22 >> EF 60 to 65%, mod LVH, grade 1 DD, mild/mod AR 3/25 bronch for peak pressures, low tidal volumes with mucus cast at end of ETT, thick secretions caking tube 3/29 Bronched again with purulent secretions LLL with plugging and LLL atelectasis, some increase in O2 requirement on vent, zosyn started for VAP coverage. MRSA nare swab today negative.  3/30 Remains intubated with waxing and weaning agitation on fentanyl and precedex  3/31 Remains on vent with continued issues with agitation when sedation lightened  4/2 family meeting, made DNR, plans for one way extubation 4/5 4/5-no overnight events, plan is for one-way extubation today 4/6-family changes mind regarding one-way extubation, plan will be for tracheostomy  Interim History / Subjective:   Family changed mind about  one-way extubation Plan for tracheostomy on Monday  Objective   Blood pressure 119/65, pulse (!) 119, temperature (!) 97.4 F (36.3 C), temperature source Axillary, resp. rate (!) 32, height 5' (1.524 m), weight 45.7 kg, SpO2 96 %.    Vent Mode: PRVC FiO2 (%):  [40 %] 40 % Set Rate:  [15 bmp-20 bmp] 20 bmp Vt Set:  [360 mL] 360 mL PEEP:  [5 cmH20] 5 cmH20 Pressure Support:  [10 cmH20] 10 cmH20 Plateau Pressure:  [14 cmH20-24 cmH20] 14 cmH20   Intake/Output Summary (Last 24 hours) at 06/29/2022 1101 Last data filed at 06/29/2022 1000 Gross per 24 hour  Intake 1619.97 ml  Output 2600 ml  Net -980.03 ml   Filed Weights   06/27/22 0500 06/28/22 0429 06/29/22 0500  Weight: 45.4 kg 42.3 kg 45.7 kg   Examination: Gen:   Chronically ill-appearing, endotracheal tube in place HEENT: Moist oral mucosa, endotracheal tube in place Lungs:    Poor air movement bilaterally, no rhonchi CV:        S1-S2 appreciated Abd:   Bowel sounds appreciated Ext: Bilateral lower extremity edema Skin:     Skin is warm and dry Neuro:   sedated, RASS -3  Resolved Hospital Problem list   Haemophilus influenzae bacteremia - completed 7d course ceftriaxone 3/28  Assessment & Plan:   Acute on chronic hypoxemic/hypercapnic respiratory failure S/p multiple bronchoscopies for mucous plugging -Empiric antibiotics for possible VAP -Recently treated for haemophilus influenza pneumonia-completed 7-day course of ceftriaxone 3/28  Underlying chronic obstructive pulmonary disease with exacerbation Tobacco abuse Lung nodule -Continue ventilator support -Keep head of the bed elevated at 30 degrees -Plateau pressure less than  30 -Pulmonary hygiene -Continue bronchodilators -To complete antibiotics today  Did tolerate weaning for about an hour today Mental status still will preclude extubation at present  History of hypertension Demand ischemia Circulatory shock -Continue monitor -Preserved ejection  fraction  Metabolic encephalopathy History of dementia, anxiety, depression -Delirium precautions -Aspiration precautions  At risk for malnutrition -Continue tube feeds  Will continue to wean as tolerated Plan will be for tracheostomy on Monday if patient unable to wean well   Best Practice (right click and "Reselect all SmartList Selections" daily)   Diet/type: tubefeeds  DVT prophylaxis: LMWH GI prophylaxis: PPI Lines: N/A Foley:  Yes, and it is still needed Code Status:  DNR   The patient is critically ill with multiple organ systems failure and requires high complexity decision making for assessment and support, frequent evaluation and titration of therapies, application of advanced monitoring technologies and extensive interpretation of multiple databases. Critical Care Time devoted to patient care services described in this note independent of APP/resident time (if applicable)  is 35 minutes.   Virl Diamond MD [facility] Critical [facility] Personal pager: See Amion If unanswered, please page CCM On-call: [phone]

## 2022-06-30 DIAGNOSIS — J9602 Acute respiratory failure with hypercapnia: Secondary | ICD-10-CM | POA: Diagnosis not present

## 2022-06-30 LAB — BASIC METABOLIC PANEL
Anion gap: 10 (ref 5–15)
CO2: 38 mmol/L — ABNORMAL HIGH (ref 22–32)
Calcium: 9.4 mg/dL (ref 8.9–10.3)
Chloride: 97 mmol/L — ABNORMAL LOW (ref 98–111)
GFR, Estimated: 42 mL/min — ABNORMAL LOW (ref >60)
Potassium: 3.6 mmol/L (ref 3.5–5.1)
Sodium: 145 mmol/L (ref 135–145)

## 2022-06-30 LAB — GLUCOSE, CAPILLARY
Glucose-Capillary: 127 mg/dL — ABNORMAL HIGH (ref 70–99)
Glucose-Capillary: 147 mg/dL — ABNORMAL HIGH (ref 70–99)
Glucose-Capillary: 161 mg/dL — ABNORMAL HIGH (ref 70–99)
Glucose-Capillary: 176 mg/dL — ABNORMAL HIGH (ref 70–99)
Glucose-Capillary: 183 mg/dL — ABNORMAL HIGH (ref 70–99)
Glucose-Capillary: 87 mg/dL (ref 70–99)

## 2022-06-30 LAB — MAGNESIUM: Magnesium: 3.4 mg/dL — ABNORMAL HIGH (ref 1.7–2.4)

## 2022-06-30 MED ORDER — OXYCODONE HCL 5 MG PO TABS
5.0000 mg | ORAL_TABLET | ORAL | Status: DC
  Administered 2022-06-30 – 2022-07-03 (×17): 5 mg
  Filled 2022-06-30 (×18): qty 1

## 2022-06-30 NOTE — Progress Notes (Addendum)
NAME:  Carla [name], MRN:  [id number], DOB:  [birth year], LOS: 18 ADMISSION DATE:  06/12/2022, CONSULTATION DATE:  06/13/2022 REFERRING MD:  Dr. Sherryll Burger, [facility], CHIEF COMPLAINT:  short of breath   History of Present Illness:  77 yo female smoker with hx of COPD from emphysema found by family unresponsive and brought to ER.  Found to have acute on chronic hypercapnia on ABG.  Intubated in ER.  She was started on therapy for COPD exacerbation.  PCCM consulted to assist with respiratory management.  Pertinent  Medical History  Hypertension, Dementia, Anxiety, Depression, GERD, Primary hyperparathyroidism, Vit D deficiency, HLD, Osteoporosis  Significant Hospital Events: Including procedures, antibiotic start and stop dates in addition to other pertinent events   3/20 Admit, intubated  Studies:  3/20 Admitted after being found unresponsive, intubated on ED arrival  CT angio chest >> atherosclerosis, severe coronary calcification, severe LVH, mild centrilobular emphysema, 2 mm nodule LUL, 4 mm nodule LUL Blood culture  >> Haemophilus influenzae 3/22 Had oxygen desaturation and low Vt with SBT this morning. Echo 06/14/22 >> EF 60 to 65%, mod LVH, grade 1 DD, mild/mod AR 3/25 bronch for peak pressures, low tidal volumes with mucus cast at end of ETT, thick secretions caking tube 3/29 Bronched again with purulent secretions LLL with plugging and LLL atelectasis, some increase in O2 requirement on vent, zosyn started for VAP coverage. MRSA nare swab today negative.  3/30 Remains intubated with waxing and weaning agitation on fentanyl and precedex  3/31 Remains on vent with continued issues with agitation when sedation lightened  4/2 family meeting, made DNR, plans for one way extubation 4/5 4/5-no overnight events, plan is for one-way extubation today 4/6-family changes mind regarding one-way extubation, plan will be for tracheostomy  Interim History / Subjective:   Family changed mind about  one-way extubation Plan for tracheostomy on Monday No overnight events Was able to wean yesterday for about an hour Mental status still poor  Objective   Blood pressure (!) 121/59, pulse 76, temperature 98 F (36.7 C), temperature source Axillary, resp. rate (!) 30, height 5' (1.524 m), weight 42.6 kg, SpO2 98 %.    Vent Mode: PRVC FiO2 (%):  [40 %] 40 % Set Rate:  [20 bmp] 20 bmp Vt Set:  [360 mL] 360 mL PEEP:  [5 cmH20] 5 cmH20 Pressure Support:  [10 cmH20] 10 cmH20 Plateau Pressure:  [14 cmH20-23 cmH20] 23 cmH20   Intake/Output Summary (Last 24 hours) at 06/30/2022 0904 Last data filed at 06/30/2022 0800 Gross per 24 hour  Intake 1524.01 ml  Output 800 ml  Net 724.01 ml   Filed Weights   06/28/22 0429 06/29/22 0500 06/30/22 0342  Weight: 42.3 kg 45.7 kg 42.6 kg   Examination: Gen:   Chronically ill-appearing, endotracheal tube in place HEENT: Moist oral mucosa, Lungs:   Poor air movement bilaterally, no rhonchi CV:       S1-S2 appreciated Abd:   Bowel sounds appreciated Ext: Bilateral lower extremity edema Skin:     Skin is warm and dry Neuro:   Sedated, RASS -3  Resolved Hospital Problem list   Haemophilus influenzae bacteremia - completed 7d course ceftriaxone 3/28  Assessment & Plan:   Acute on chronic hypoxemic/hypercapnic respiratory failure S/p multiple bronchoscopies for mucous plugging -Empiric antibiotics for possible VAP -Recently treated for haemophilus influenza pneumonia-completed 7 days of ceftriaxone 3/28  Underlying chronic obstructive pulmonary disease with exacerbation Tobacco abuse Lungs are clear -Continue ventilator support -Keep head of bed  elevated 30 degrees -Platelet pressure less than 30 -Pulmonary hygiene -Bronchodilators -Completed antibiotics  History of hypertension Demand ischemia Circulatory shock -Continue to monitor -Preserved ejection fraction  Tolerated weaning yesterday -Goal is to wean again today  Metabolic  encephalopathy History of dementia, anxiety, depression -Delirium precautions -Aspiration precautions  At risk for malnutrition -Continue tube feeds  Plan for tracheostomy on Monday if patient unable to wean   Best Practice (right click and "Reselect all SmartList Selections" daily)   Diet/type: tubefeeds  DVT prophylaxis: LMWH GI prophylaxis: PPI Lines: N/A Foley:  Yes, and it is still needed Code Status:  DNR  The patient is critically ill with multiple organ systems failure and requires high complexity decision making for assessment and support, frequent evaluation and titration of therapies, application of advanced monitoring technologies and extensive interpretation of multiple databases. Critical Care Time devoted to patient care services described in this note independent of APP/resident time (if applicable)  is 31 minutes.   Virl Diamond MD [facility] Personal pager: See Amion If unanswered, please page CCM On-call: #[phone]

## 2022-06-30 NOTE — Progress Notes (Addendum)
eLink Physician-Brief Progress Note Patient Name: Carla [name] DOB: 31-Jan-1946 MRN: [id number]   Date of Service  06/30/2022  HPI/Events of Note  77 year old female, presented hypercapnia, decreased appetite and was eventually intubated for COPD exacerbation with haemophilus bacteremia.  Initially planned extubation, but now family would like to proceed with tracheostomy  eICU Interventions  Hold tube feeds at midnight.   0009 - CBG 87, TF to be held, add low dose d10 drip 0143 -bladder scan with greater than 900 cc, as needed In-N-Out cath  Intervention Category Minor Interventions: Routine modifications to care plan (e.g. PRN medications for pain, fever)  Keisha Amer 06/30/2022, 11:02 PM

## 2022-07-01 ENCOUNTER — Inpatient Hospital Stay (HOSPITAL_COMMUNITY): Payer: HMO

## 2022-07-01 DIAGNOSIS — J9602 Acute respiratory failure with hypercapnia: Secondary | ICD-10-CM | POA: Diagnosis not present

## 2022-07-01 DIAGNOSIS — J439 Emphysema, unspecified: Secondary | ICD-10-CM | POA: Diagnosis not present

## 2022-07-01 LAB — BASIC METABOLIC PANEL
Anion gap: 8 (ref 5–15)
BUN: 72 mg/dL — ABNORMAL HIGH (ref 8–23)
Calcium: 8.7 mg/dL — ABNORMAL LOW (ref 8.9–10.3)
GFR, Estimated: 32 mL/min — ABNORMAL LOW (ref >60)
Glucose, Bld: 457 mg/dL — ABNORMAL HIGH (ref 70–99)
Sodium: 138 mmol/L (ref 135–145)

## 2022-07-01 LAB — GLUCOSE, CAPILLARY
Glucose-Capillary: 143 mg/dL — ABNORMAL HIGH (ref 70–99)
Glucose-Capillary: 229 mg/dL — ABNORMAL HIGH (ref 70–99)
Glucose-Capillary: 84 mg/dL (ref 70–99)
Glucose-Capillary: 161 mg/dL — ABNORMAL HIGH (ref 70–99)

## 2022-07-01 LAB — CBC
MCH: 30.2 pg (ref 26.0–34.0)
RBC: 3.61 MIL/uL — ABNORMAL LOW (ref 3.87–5.11)
WBC: 11.4 10*3/uL — ABNORMAL HIGH (ref 4.0–10.5)

## 2022-07-01 LAB — PROTIME-INR
INR: 1.2 (ref 0.8–1.2)
Prothrombin Time: 15 seconds (ref 11.4–15.2)

## 2022-07-01 MED ORDER — ETOMIDATE 2 MG/ML IV SOLN
20.0000 mg | Freq: Once | INTRAVENOUS | Status: AC
  Administered 2022-07-01: 10 mg via INTRAVENOUS
  Filled 2022-07-01: qty 10

## 2022-07-01 MED ORDER — FENTANYL CITRATE PF 50 MCG/ML IJ SOSY
200.0000 ug | PREFILLED_SYRINGE | Freq: Once | INTRAMUSCULAR | Status: AC
  Administered 2022-07-01: 200 ug via INTRAVENOUS
  Filled 2022-07-01: qty 4

## 2022-07-01 MED ORDER — LIDOCAINE-EPINEPHRINE (PF) 2 %-1:200000 IJ SOLN
10.0000 mL | Freq: Once | INTRAMUSCULAR | Status: AC
  Administered 2022-07-01: 10 mL via INTRADERMAL

## 2022-07-01 MED ORDER — MIDAZOLAM HCL 2 MG/2ML IJ SOLN
5.0000 mg | Freq: Once | INTRAMUSCULAR | Status: AC
  Administered 2022-07-01: 2 mg via INTRAVENOUS
  Filled 2022-07-01: qty 6

## 2022-07-01 MED ORDER — ENOXAPARIN SODIUM 30 MG/0.3ML IJ SOSY
30.0000 mg | PREFILLED_SYRINGE | Freq: Every day | INTRAMUSCULAR | Status: DC
  Administered 2022-07-02 – 2022-08-29 (×54): 30 mg via SUBCUTANEOUS
  Filled 2022-07-01 (×57): qty 0.3

## 2022-07-01 MED ORDER — ROCURONIUM BROMIDE 50 MG/5ML IV SOLN
100.0000 mg | Freq: Once | INTRAVENOUS | Status: AC
  Administered 2022-07-01: 50 mg via INTRAVENOUS
  Filled 2022-07-01: qty 10

## 2022-07-01 MED ORDER — DEXTROSE 10 % IV SOLN: INTRAVENOUS | Status: DC

## 2022-07-01 NOTE — Procedures (Addendum)
Percutaneous Tracheostomy Procedure Note   Carla [name]  [id number]  01/20/1946  Date:07/01/22  Time:2:11 PM   Provider Performing:Tyray Proch  Procedure: Percutaneous Tracheostomy with Bronchoscopic Guidance (31600)  Indication(s) Acute respiratory failure  Consent Risks of the procedure as well as the alternatives and risks of each were explained to the patient and/or caregiver.  Consent for the procedure was obtained.  Anesthesia Etomidate, Versed, Fentanyl, Vecuronium   Time Out Verified patient identification, verified procedure, site/side was marked, verified correct patient position, special equipment/implants available, medications/allergies/relevant history reviewed, required imaging and test results available.   Sterile Technique Maximal sterile technique including sterile barrier drape, hand hygiene, sterile gown, sterile gloves, mask, hair covering.    Procedure Description Appropriate anatomy identified by palpation.  Patient's neck prepped and draped in sterile fashion.  1% lidocaine with epinephrine was used to anesthetize skin overlying neck.  1.5cm incision made and blunt dissection performed until tracheal rings could be easily palpated.   Then a size 6 Shiley tracheostomy was placed under bronchoscopic visualization using usual Seldinger technique and serial dilation.   Bronchoscope confirmed placement above the carina.  Tracheostomy was sutured in place with adhesive pad to protect skin under pressure.    Patient connected to ventilator.   Complications/Tolerance None; patient tolerated the procedure well. Chest X-ray is ordered to confirm no post-procedural complication.   EBL Minimal   Specimen(s) None   

## 2022-07-01 NOTE — Progress Notes (Addendum)
eLink Physician-Brief Progress Note Patient Name: Carla [name] DOB: 1945/08/16 MRN: [id number]   Date of Service  07/01/2022  HPI/Events of Note  77 year old female, presented hypercapnia, decreased appetite and was eventually intubated for COPD exacerbation with haemophilus bacteremia   Has had multiple in and out caths for urinary retention.  eICU Interventions  Proceed with Foley.     Intervention Category Minor Interventions: Clinical assessment - ordering diagnostic tests  Sharnette Kitamura 07/01/2022, 9:13 PM

## 2022-07-01 NOTE — Progress Notes (Addendum)
NAME:  Carla [name], MRN:  [id number], DOB:  Apr 05, 1945, LOS: 19 ADMISSION DATE:  06/12/2022, CONSULTATION DATE:  06/13/2022 REFERRING MD:  Dr. Sherryll Burger, [facility], CHIEF COMPLAINT:  short of breath   History of Present Illness:  77 yo female smoker with hx of COPD from emphysema found by family unresponsive and brought to ER.  Found to have acute on chronic hypercapnia on ABG.  Intubated in ER.  She was started on therapy for COPD exacerbation.  PCCM consulted to assist with respiratory management.  Pertinent  Medical History  Hypertension, Dementia, Anxiety, Depression, GERD, Primary hyperparathyroidism, Vit D deficiency, HLD, Osteoporosis  Significant Hospital Events: Including procedures, antibiotic start and stop dates in addition to other pertinent events   3/20 Admit, intubated  Studies:  3/20 Admitted after being found unresponsive, intubated on ED arrival  CT angio chest >> atherosclerosis, severe coronary calcification, severe LVH, mild centrilobular emphysema, 2 mm nodule LUL, 4 mm nodule LUL Blood culture  >> Haemophilus influenzae 3/22 Had oxygen desaturation and low Vt with SBT this morning. Echo 06/14/22 >> EF 60 to 65%, mod LVH, grade 1 DD, mild/mod AR 3/25 bronch for peak pressures, low tidal volumes with mucus cast at end of ETT, thick secretions caking tube 3/29 Bronched again with purulent secretions LLL with plugging and LLL atelectasis, some increase in O2 requirement on vent, zosyn started for VAP coverage. MRSA nare swab today negative.  3/30 Remains intubated with waxing and weaning agitation on fentanyl and precedex  3/31 Remains on vent with continued issues with agitation when sedation lightened  4/2 family meeting, made DNR, plans for one way extubation 4/5 4/5-no overnight events, plan is for one-way extubation today 4/6-family changes mind regarding one-way extubation, plan will be for tracheostomy 4/8 did wean well over the weekend, tolerating pressure support  ventilation 15/5 for over 8 hours on 4/7  Interim History / Subjective:   Family changed mind about one-way extubation Plan for tracheostomy today but she did wean fairly well over the weekend We will attempt to wean in today Mental status remains poor  Objective   Blood pressure (!) 99/48, pulse 64, temperature 98.6 F (37 C), temperature source Axillary, resp. rate 20, height 5' (1.524 m), weight 41.5 kg, SpO2 95 %.    Vent Mode: PRVC FiO2 (%):  [40 %] 40 % Set Rate:  [20 bmp] 20 bmp Vt Set:  [360 mL] 360 mL PEEP:  [5 cmH20] 5 cmH20 Pressure Support:  [10 cmH20] 10 cmH20 Plateau Pressure:  [11 cmH20-23 cmH20] 11 cmH20   Intake/Output Summary (Last 24 hours) at 07/01/2022 1012 Last data filed at 07/01/2022 0900 Gross per 24 hour  Intake 1691.92 ml  Output 1550 ml  Net 141.92 ml   Filed Weights   06/29/22 0500 06/30/22 0342 07/01/22 0148  Weight: 45.7 kg 42.6 kg 41.5 kg   Examination: Gen:   Chronically ill-appearing HEENT: Endotracheal tube in place Lungs:   Poor air movement bilaterally CV:       S1-S2 appreciated Abd:   Bowel sounds appreciated Ext: Bilateral lower extremity edema Skin:     Skin is warm and dry Neuro:   Sedated  Resolved Hospital Problem list   Haemophilus influenzae bacteremia - completed 7d course ceftriaxone 3/28  Assessment & Plan:   Acute on chronic hypoxemic/hypercapnic respiratory failure S/p multiple bronchoscopies for mucous plugging Was recently treated for haemophilus influenza pneumonia completed 7 days of ceftriaxone Underlying chronic obstructive pulmonary disease with exacerbation History of tobacco use -  Continue vent support -Continue weaning -Pulmonary hygiene -Bronchodilators -Did complete course of antibiotics  History of hypertension Demand ischemia Circulatory shock -Continue to monitor -Has been stable hemodynamically  Metabolic encephalopathy History of dementia, anxiety, depression -Continue delirium  precautions Aspiration precautions  At risk for malnutrition -Continue tube feeds  Will attempt to wean today and possible extubation but if not weaning well then we will proceed with tracheostomy  Did update son Loraine Leriche today over the phone  Best Practice (right click and "Reselect all SmartList Selections" daily)   Diet/type: tubefeeds  DVT prophylaxis: LMWH GI prophylaxis: PPI Lines: N/A Foley:  Yes, and it is still needed Code Status:  DNR  The patient is critically ill with multiple organ systems failure and requires high complexity decision making for assessment and support, frequent evaluation and titration of therapies, application of advanced monitoring technologies and extensive interpretation of multiple databases. Critical Care Time devoted to patient care services described in this note independent of APP/resident time (if applicable)  is 32 minutes.   Virl Diamond MD [facility] Personal pager: See Amion If unanswered, please page CCM On-call: #[phone]

## 2022-07-01 NOTE — Procedures (Addendum)
Diagnostic Bronchoscopy  Carla [name]  [id number]  1945-06-20  Date:07/01/22  Time:2:06 PM   Provider Performing:Ainara Eldridge A Abeni Finchum   Procedure: Diagnostic Bronchoscopy (78295)  Indication(s) Assist with direct visualization of tracheostomy placement  Consent Risks of the procedure as well as the alternatives and risks of each were explained to the patient and/or caregiver.  Consent for the procedure was obtained.   Anesthesia See separate tracheostomy note   Time Out Verified patient identification, verified procedure, site/side was marked, verified correct patient position, special equipment/implants available, medications/allergies/relevant history reviewed, required imaging and test results available.   Sterile Technique Usual hand hygiene, masks, gowns, and gloves were used   Procedure Description Bronchoscope advanced through endotracheal tube and into airway.  After suctioning out tracheal secretions, bronchoscope used to provide direct visualization of tracheostomy placement.   Complications/Tolerance None; patient tolerated the procedure well.   EBL None  Specimen(s) None

## 2022-07-02 DIAGNOSIS — R339 Retention of urine, unspecified: Secondary | ICD-10-CM

## 2022-07-02 DIAGNOSIS — J9621 Acute and chronic respiratory failure with hypoxia: Secondary | ICD-10-CM | POA: Diagnosis not present

## 2022-07-02 DIAGNOSIS — J9622 Acute and chronic respiratory failure with hypercapnia: Secondary | ICD-10-CM | POA: Diagnosis not present

## 2022-07-02 DIAGNOSIS — E8770 Fluid overload, unspecified: Secondary | ICD-10-CM

## 2022-07-02 DIAGNOSIS — J441 Chronic obstructive pulmonary disease with (acute) exacerbation: Secondary | ICD-10-CM | POA: Diagnosis not present

## 2022-07-02 LAB — GLUCOSE, CAPILLARY
Glucose-Capillary: 119 mg/dL — ABNORMAL HIGH (ref 70–99)
Glucose-Capillary: 140 mg/dL — ABNORMAL HIGH (ref 70–99)
Glucose-Capillary: 189 mg/dL — ABNORMAL HIGH (ref 70–99)
Glucose-Capillary: 213 mg/dL — ABNORMAL HIGH (ref 70–99)
Glucose-Capillary: 205 mg/dL — ABNORMAL HIGH (ref 70–99)

## 2022-07-02 LAB — BASIC METABOLIC PANEL
GFR, Estimated: 31 mL/min — ABNORMAL LOW (ref >60)
Glucose, Bld: 149 mg/dL — ABNORMAL HIGH (ref 70–99)
Potassium: 3.9 mmol/L (ref 3.5–5.1)
Sodium: 145 mmol/L (ref 135–145)

## 2022-07-02 LAB — CULTURE, RESPIRATORY W GRAM STAIN

## 2022-07-02 MED ORDER — JUVEN PO PACK
1.0000 | PACK | Freq: Two times a day (BID) | ORAL | Status: DC
  Administered 2022-07-02 – 2022-07-15 (×24): 1
  Filled 2022-07-02 (×24): qty 1

## 2022-07-02 MED ORDER — SODIUM CHLORIDE 0.9 % IV BOLUS
500.0000 mL | Freq: Once | INTRAVENOUS | Status: AC
  Administered 2022-07-02: 500 mL via INTRAVENOUS

## 2022-07-02 MED ORDER — OSMOLITE 1.5 CAL PO LIQD
1000.0000 mL | ORAL | Status: DC
  Administered 2022-07-02 – 2022-07-13 (×10): 1000 mL
  Filled 2022-07-02 (×10): qty 1000

## 2022-07-02 MED ORDER — NUTRISOURCE FIBER PO PACK
1.0000 | PACK | Freq: Every day | ORAL | Status: DC
  Administered 2022-07-03 – 2022-07-13 (×10): 1
  Filled 2022-07-02 (×14): qty 1

## 2022-07-02 MED ORDER — PROSOURCE TF20 ENFIT COMPATIBL EN LIQD
60.0000 mL | Freq: Every day | ENTERAL | Status: DC
  Administered 2022-07-03 – 2022-07-13 (×10): 60 mL
  Filled 2022-07-02 (×11): qty 60

## 2022-07-02 MED ORDER — BETHANECHOL CHLORIDE 10 MG PO TABS
10.0000 mg | ORAL_TABLET | Freq: Three times a day (TID) | ORAL | Status: AC
  Administered 2022-07-02 – 2022-07-04 (×9): 10 mg
  Filled 2022-07-02 (×9): qty 1

## 2022-07-02 MED ORDER — FENTANYL CITRATE PF 50 MCG/ML IJ SOSY: 50.0000 ug | PREFILLED_SYRINGE | Freq: Once | INTRAMUSCULAR | Status: AC

## 2022-07-02 MED ORDER — FENTANYL CITRATE PF 50 MCG/ML IJ SOSY
PREFILLED_SYRINGE | INTRAMUSCULAR | Status: AC
  Administered 2022-07-02: 50 ug via INTRAVENOUS
  Filled 2022-07-02: qty 1

## 2022-07-02 NOTE — Progress Notes (Addendum)
NAME:  Carla [name], MRN:  [id number], DOB:  11/22/1945, LOS: 20 ADMISSION DATE:  06/12/2022, CONSULTATION DATE:  06/13/2022 REFERRING MD:  Dr. Sherryll Burger, [facility], CHIEF COMPLAINT:  short of breath   History of Present Illness:  77 yo female smoker with hx of COPD from emphysema found by family unresponsive and brought to ER.  Found to have acute on chronic hypercapnia on ABG.  Intubated in ER.  She was started on therapy for COPD exacerbation.  PCCM consulted to assist with respiratory management.  Pertinent  Medical History  Hypertension, Dementia, Anxiety, Depression, GERD, Primary hyperparathyroidism, Vit D deficiency, HLD, Osteoporosis  Significant Hospital Events: Including procedures, antibiotic start and stop dates in addition to other pertinent events   3/20 Admit, intubated  Studies:  3/20 Admitted after being found unresponsive, intubated on ED arrival  CT angio chest >> atherosclerosis, severe coronary calcification, severe LVH, mild centrilobular emphysema, 2 mm nodule LUL, 4 mm nodule LUL Blood culture  >> Haemophilus influenzae 3/22 Had oxygen desaturation and low Vt with SBT this morning. Echo 06/14/22 >> EF 60 to 65%, mod LVH, grade 1 DD, mild/mod AR 3/25 bronch for peak pressures, low tidal volumes with mucus cast at end of ETT, thick secretions caking tube 3/29 Bronched again with purulent secretions LLL with plugging and LLL atelectasis, some increase in O2 requirement on vent, zosyn started for VAP coverage. MRSA nare swab today negative.  3/30 Remains intubated with waxing and weaning agitation on fentanyl and precedex  3/31 Remains on vent with continued issues with agitation when sedation lightened  4/2 family meeting, made DNR, plans for one way extubation 4/5 4/5-no overnight events, plan is for one-way extubation today 4/6-family changes mind regarding one-way extubation, plan will be for tracheostomy 4/8 trach placed  Interim History / Subjective:   Trach placed  yesterday Mental status remains poor Sedation now off Weaning  Objective   Blood pressure 119/62, pulse (!) 104, temperature 98.1 F (36.7 C), temperature source Axillary, resp. rate 18, height 5' (1.524 m), weight 44.3 kg, SpO2 94 %.    Vent Mode: PRVC FiO2 (%):  [40 %-100 %] 40 % Set Rate:  [20 bmp] 20 bmp Vt Set:  [360 mL] 360 mL PEEP:  [5 cmH20] 5 cmH20 Plateau Pressure:  [11 cmH20-22 cmH20] 16 cmH20   Intake/Output Summary (Last 24 hours) at 07/02/2022 0930 Last data filed at 07/02/2022 4098 Gross per 24 hour  Intake 1166.81 ml  Output 2235 ml  Net -1068.19 ml    Filed Weights   06/30/22 0342 07/01/22 0148 07/02/22 0500  Weight: 42.6 kg 41.5 kg 44.3 kg   Examination: Gen:   Frail elderly appearing female HEENT:  Trach in place. /AT otherwise.  Lungs:   clear bilateral breath sounds CV:      RRR no MRG Abd:   Soft, NT, normoactive.  Ext: Edema improved.  Skin:     Skin is warm and dry Neuro:   response only to pain.   Resolved Hospital Problem list   Haemophilus influenzae bacteremia - completed 7d course ceftriaxone 3/28  Assessment & Plan:   Acute on chronic hypoxemic/hypercapnic respiratory failure S/p multiple bronchoscopies for mucous plugging Was recently treated for haemophilus influenza pneumonia completed 7 days of ceftriaxone Underlying chronic obstructive pulmonary disease with exacerbation History of tobacco use -Continue vent support > wean to trach collar as tolerate. Rest at night.  -Pulmonary hygiene -Bronchodilators -D/c fentanyl infusion -Still getting enterals (oxy, clonazepam Seroquel), which we can wean  starting tomorrow if she is still somnolent.   History of hypertension Demand ischemia Circulatory shock - Midodrine  Metabolic encephalopathy History of dementia, anxiety, depression - Continue delirium precautions  AKI worsening Contraction alkalosis - stop lasix - trend I&O  At risk for malnutrition -Continue tube feeds -  If she doesn't start to wake up with sedation weaning we will need to consider PEG.   Urinary retention: foley placed 4/9 - Bethanechol   Best Practice (right click and "Reselect all SmartList Selections" daily)   Diet/type: tubefeeds  DVT prophylaxis: LMWH GI prophylaxis: PPI Lines: N/A Foley:  Yes, and it is still needed Code Status:  DNR   Joneen Roach, AGACNP-BC [facility]  See Amion for personal pager PCCM on call pager [phone] until 7pm. Please call Elink 7p-7a. [phone]  07/02/2022 9:46 AM

## 2022-07-02 NOTE — Care Management Important Message (Addendum)
Important Message  Patient Details  Name: Carla [name] MRN: [id number] Date of Birth: 12/31/1945   Medicare Important Message Given:  Yes     Dorena Bodo 07/02/2022, 2:27 PM

## 2022-07-02 NOTE — Evaluation (Addendum)
Physical Therapy Evaluation Patient Details Name: Carla [name] MRN: [id number] DOB: 01/29/1946 Today's Date: 07/02/2022  History of Present Illness  77 yo female admitted 3/20 unresponsive from home with acute respiratory failure with hypoxia and hypercapnia and metabolic encephalopathy. Intubated 3/20. trach 4/8. PMhx: emphysema, tobacco abuse, PVD, HTN, prediabetes, anxiety, dementia  Clinical Impression  Pt lethargic without significant response to stimulation. Partial eye opening after wiping face and sitting EOB with noted automatic movement bil UE. Pt unable to arouse with max stimulation and movement. Family reporting pt was independent in mobility and function PTA living with spouse. Pt currently total assist for all mobility with decreased cognition, strength and function. Will acute therapy for 2 weeks to see if pt able to arouse and participate to maximize function and decrease burden of care.   PRVC 40% FIO2, peep 5 HR 109 BP 125/65       Recommendations for follow up therapy are one component of a multi-disciplinary discharge planning process, led by the attending physician.  Recommendations may be updated based on patient status, additional functional criteria and insurance authorization.  Follow Up Recommendations       Assistance Recommended at Discharge Frequent or constant Supervision/Assistance  Patient can return home with the following  Two people to help with walking and/or transfers;Two people to help with bathing/dressing/bathroom;Direct supervision/assist for medications management;Assistance with cooking/housework    Equipment Recommendations Hospital bed;Other (comment) (hoyer)  Recommendations for Other Services       Functional Status Assessment Patient has had a recent decline in their functional status and/or demonstrates limited ability to make significant improvements in function in a reasonable and predictable amount of time     Precautions /  Restrictions Precautions Precautions: Other (comment);Fall Precaution Comments: vent, trach, cortrak Restrictions Weight Bearing Restrictions: No      Mobility  Bed Mobility Overal bed mobility: Needs Assistance Bed Mobility: Supine to Sit, Sit to Supine     Supine to sit: HOB elevated, Total assist, +2 for safety/equipment Sit to supine: Total assist, +2 for safety/equipment   General bed mobility comments: total assist with HOB elevated to pivot to EOB. Pt with tendency for posterior lean with max assist for sitting balance. Total +2 to return to supine. Pt with partial eye opening x 2 and automatic movement of bil UE x 1. No command following and no significant change in attention/ arousal with increased mobility    Transfers                   General transfer comment: did not attempt, total assist    Ambulation/Gait                  Stairs            Wheelchair Mobility    Modified Rankin (Stroke Patients Only)       Balance Overall balance assessment: Needs assistance Sitting-balance support: No upper extremity supported, Feet supported Sitting balance-Leahy Scale: Zero Sitting balance - Comments: max assist EOB                                     Pertinent Vitals/Pain Pain Assessment Pain Assessment: CPOT Facial Expression: Relaxed, neutral Body Movements: Absence of movements Muscle Tension: Relaxed Compliance with ventilator (intubated pts.): Tolerating ventilator or movement Vocalization (extubated pts.): N/A CPOT Total: 0    Home Living Family/patient expects to be discharged to:: Private   residence Living Arrangements: Spouse/significant other Available Help at Discharge: Available 24 hours/day;Family Type of Home: House Home Access: Stairs to enter   Entrance Stairs-Number of Steps: 1   Home Layout: Two level;Bed/bath upstairs Home Equipment: None      Prior Function Prior Level of Function :  Independent/Modified Independent                     Hand Dominance        Extremity/Trunk Assessment   Upper Extremity Assessment Upper Extremity Assessment: Difficult to assess due to impaired cognition    Lower Extremity Assessment Lower Extremity Assessment: Difficult to assess due to impaired cognition    Cervical / Trunk Assessment Cervical / Trunk Assessment: Normal  Communication      Cognition Arousal/Alertness: Lethargic Behavior During Therapy: Flat affect Overall Cognitive Status: Difficult to assess                                 General Comments: pt lethargic throughout session despite max stimulation. partial eye opening x 2 and automatic movement of bil UE x 1 without further response or command following        General Comments      Exercises     Assessment/Plan    PT Assessment Patient needs continued PT services  PT Problem List Decreased strength;Decreased mobility;Decreased safety awareness;Decreased activity tolerance;Decreased cognition;Decreased balance       PT Treatment Interventions Therapeutic activities;Cognitive remediation;Therapeutic exercise;Balance training;Functional mobility training;Patient/family education;Neuromuscular re-education    PT Goals (Current goals can be found in the Care Plan section)  Acute Rehab PT Goals Patient Stated Goal: return to moving and walking PT Goal Formulation: With family Time For Goal Achievement: 07/16/22 Potential to Achieve Goals: Poor    Frequency Min 1X/week     Co-evaluation               AM-PAC PT "6 Clicks" Mobility  Outcome Measure Help needed turning from your back to your side while in a flat bed without using bedrails?: Total Help needed moving from lying on your back to sitting on the side of a flat bed without using bedrails?: Total Help needed moving to and from a bed to a chair (including a wheelchair)?: Total Help needed standing up from a  chair using your arms (e.g., wheelchair or bedside chair)?: Total Help needed to walk in hospital room?: Total Help needed climbing 3-5 steps with a railing? : Total 6 Click Score: 6    End of Session   Activity Tolerance: Patient limited by lethargy Patient left: in bed;with call bell/phone within reach;Other (comment) (with chest pt and bed alarm unable to be set at this time) Nurse Communication: Mobility status;Need for lift equipment PT Visit Diagnosis: Other abnormalities of gait and mobility (R26.89)    Time: 0806-0820 PT Time Calculation (min) (ACUTE ONLY): 14 min   Charges:   PT Evaluation $PT Eval High Complexity: 1 High          Tarah Buboltz P, PT Acute Rehabilitation Services Office: [phone]   Pollyann Roa B Ezekial Arns 07/02/2022, 10:31 AM  

## 2022-07-03 ENCOUNTER — Inpatient Hospital Stay (HOSPITAL_COMMUNITY): Payer: HMO

## 2022-07-03 DIAGNOSIS — Z930 Tracheostomy status: Secondary | ICD-10-CM | POA: Diagnosis not present

## 2022-07-03 DIAGNOSIS — J9602 Acute respiratory failure with hypercapnia: Secondary | ICD-10-CM | POA: Diagnosis not present

## 2022-07-03 DIAGNOSIS — J189 Pneumonia, unspecified organism: Secondary | ICD-10-CM | POA: Diagnosis not present

## 2022-07-03 DIAGNOSIS — J441 Chronic obstructive pulmonary disease with (acute) exacerbation: Secondary | ICD-10-CM | POA: Diagnosis not present

## 2022-07-03 DIAGNOSIS — J9621 Acute and chronic respiratory failure with hypoxia: Secondary | ICD-10-CM | POA: Diagnosis not present

## 2022-07-03 DIAGNOSIS — G9340 Encephalopathy, unspecified: Secondary | ICD-10-CM

## 2022-07-03 DIAGNOSIS — J9622 Acute and chronic respiratory failure with hypercapnia: Secondary | ICD-10-CM | POA: Diagnosis not present

## 2022-07-03 LAB — BASIC METABOLIC PANEL
Anion gap: 8 (ref 5–15)
BUN: 86 mg/dL — ABNORMAL HIGH (ref 8–23)
CO2: 38 mmol/L — ABNORMAL HIGH (ref 22–32)
Creatinine, Ser: 1.45 mg/dL — ABNORMAL HIGH (ref 0.44–1.00)
Glucose, Bld: 141 mg/dL — ABNORMAL HIGH (ref 70–99)
Sodium: 149 mmol/L — ABNORMAL HIGH (ref 135–145)

## 2022-07-03 LAB — GLUCOSE, CAPILLARY
Glucose-Capillary: 125 mg/dL — ABNORMAL HIGH (ref 70–99)
Glucose-Capillary: 159 mg/dL — ABNORMAL HIGH (ref 70–99)
Glucose-Capillary: 163 mg/dL — ABNORMAL HIGH (ref 70–99)
Glucose-Capillary: 187 mg/dL — ABNORMAL HIGH (ref 70–99)
Glucose-Capillary: 203 mg/dL — ABNORMAL HIGH (ref 70–99)

## 2022-07-03 LAB — CBC
Hemoglobin: 12 g/dL (ref 12.0–15.0)
MCH: 31 pg (ref 26.0–34.0)
MCHC: 31.4 g/dL (ref 30.0–36.0)
MCV: 98.7 fL (ref 80.0–100.0)
Platelets: 366 10*3/uL (ref 150–400)
RBC: 3.87 MIL/uL (ref 3.87–5.11)
WBC: 12.4 10*3/uL — ABNORMAL HIGH (ref 4.0–10.5)

## 2022-07-03 MED ORDER — MIDAZOLAM HCL 2 MG/2ML IJ SOLN
0.5000 mg | INTRAMUSCULAR | Status: DC | PRN
  Administered 2022-07-03: 1 mg via INTRAVENOUS
  Filled 2022-07-03: qty 2

## 2022-07-03 MED ORDER — FENTANYL CITRATE PF 50 MCG/ML IJ SOSY
25.0000 ug | PREFILLED_SYRINGE | INTRAMUSCULAR | Status: DC | PRN
  Administered 2022-07-03: 50 ug via INTRAVENOUS
  Filled 2022-07-03: qty 1

## 2022-07-03 MED ORDER — OXYCODONE HCL 5 MG PO TABS
5.0000 mg | ORAL_TABLET | Freq: Three times a day (TID) | ORAL | Status: DC
  Administered 2022-07-03 – 2022-07-08 (×15): 5 mg
  Filled 2022-07-03 (×16): qty 1

## 2022-07-03 NOTE — Progress Notes (Addendum)
eLink Physician-Brief Progress Note Patient Name: DANELY KRAUSZ DOB: [birth year] MRN: [id number]   Date of Service  07/03/2022  HPI/Events of Note  Patient placed back on PRVC due to failed weaning trial, she needs PRN sedation orders on full ventilator support.  eICU Interventions  PRN Fentanyl and Versed orders entered.        Vertie Dibbern U Pritesh Sobecki 07/03/2022, 6:18 AM

## 2022-07-03 NOTE — Progress Notes (Addendum)
NAME:  Carla [name], MRN:  [id number], DOB:  07-May-1945, LOS: 21 ADMISSION DATE:  06/12/2022, CONSULTATION DATE:  06/13/2022 REFERRING MD:  Dr. Sherryll Burger, [facility], CHIEF COMPLAINT:  short of breath   History of Present Illness:  77 yo female smoker with hx of COPD from emphysema found by family unresponsive and brought to ER.  Found to have acute on chronic hypercapnia on ABG.  Intubated in ER.  She was started on therapy for COPD exacerbation.  PCCM consulted to assist with respiratory management.  Pertinent  Medical History  Hypertension, Dementia, Anxiety, Depression, GERD, Primary hyperparathyroidism, Vit D deficiency, HLD, Osteoporosis  Significant Hospital Events: Including procedures, antibiotic start and stop dates in addition to other pertinent events   3/20 Admit, intubated  Studies:  3/20 Admitted after being found unresponsive, intubated on ED arrival  CT angio chest >> atherosclerosis, severe coronary calcification, severe LVH, mild centrilobular emphysema, 2 mm nodule LUL, 4 mm nodule LUL Blood culture  >> Haemophilus influenzae 3/22 Had oxygen desaturation and low Vt with SBT this morning. Echo 06/14/22 >> EF 60 to 65%, mod LVH, grade 1 DD, mild/mod AR 3/25 bronch for peak pressures, low tidal volumes with mucus cast at end of ETT, thick secretions caking tube 3/29 Bronched again with purulent secretions LLL with plugging and LLL atelectasis, some increase in O2 requirement on vent, zosyn started for VAP coverage. MRSA nare swab today negative.  3/30 Remains intubated with waxing and weaning agitation on fentanyl and precedex  3/31 Remains on vent with continued issues with agitation when sedation lightened  4/2 family meeting, made DNR, plans for one way extubation 4/5 4/5-no overnight events, plan is for one-way extubation today 4/6-family changes mind regarding one-way extubation, plan will be for tracheostomy 4/8 trach placed 4/10 failed overnight weaning trial, back on  PRVC and sedation  was added.   Interim History / Subjective:   Failed weaning trial overnight, back on PRVC   Failed SBT x 2 this morning   Objective   Blood pressure 114/71, pulse (!) 108, temperature 98.4 F (36.9 C), temperature source Axillary, resp. rate (!) 27, height 5' (1.524 m), weight 43.9 kg, SpO2 93 %.    Vent Mode: PRVC FiO2 (%):  [40 %-45 %] 45 % Set Rate:  [22 bmp] 22 bmp Vt Set:  [360 mL] 360 mL PEEP:  [5 cmH20] 5 cmH20 Pressure Support:  [10 cmH20] 10 cmH20 Plateau Pressure:  [16 cmH20] 16 cmH20   Intake/Output Summary (Last 24 hours) at 07/03/2022 1020 Last data filed at 07/03/2022 1000 Gross per 24 hour  Intake 2069.37 ml  Output 1998 ml  Net 71.37 ml   Filed Weights   07/01/22 0148 07/02/22 0500 07/03/22 0326  Weight: 41.5 kg 44.3 kg 43.9 kg   Examination: Gen:   Chronically and critically ill elderly F  HEENT: trach secure. Anicteric sclera  Lungs:   Coarse, thick white/tan pulm secretions  CV:      tachycardic regular  Abd:   Thin ndnt  Ext: No acute joint deformity. Muscle wasting symmetrically  Skin:     pale c/d/w  Neuro:   Does not follow commands.   Resolved Hospital Problem list    H Flu bacteremia Septic shock due to H Flu bacteremia, H Flu PNA   Assessment & Plan:   Acute encephalopathy superimposed on chronic dementia Anxiety  -medication related, ICU delirium P -weaning sedating meds. With trach should not have high PAD burden ongoing. Dc seroquel, decr oxy  to q8 from q4  -delirium precautions -PT/OT  Acute on chronic hypoxic and hypercarbic resp failure S/p tracheostomy  COPD w exacerbation Hx tobacco use Recent Hflu PNA  P -TCT as tolerated, has not yet been able to tolerate overnight ATC and has needed PRVC support -VAP, pulm hygiene  -will send a trach asp given incr resp secretions, pct cxr   -think she will benefit from LTAC   Hx HTN Demand ischemia Shock --> vasoplegia  -Cont 5mg  midodrine q8 -- may be able to  wean to BID in coming days  -holding antihypertensives in this setting   AKI Contraction alkalosis 2/2 diuresis  -check BMP this morning -trending UOP  -holding diuretic   Poor PO intake / At risk for malnutrition P -EN via cortrak -defer PEG eval for now, think we should decr sedating meds first in case her mentation improves enough for PO   Urinary retention -foley replaced 4/9 P - Bethanechol  L/T/D -Janina Mayo  (4/8)- maintain -LUE PICC (3/29)- will see if we can get adequate PIV access and dc -foley (4/9) - cont, urinary retention    Best Practice (right click and "Reselect all SmartList Selections" daily)   Diet/type: tubefeeds  DVT prophylaxis: LMWH GI prophylaxis: PPI Lines: Central line -- LUE PICC  Foley:  Yes, and it is still needed Code Status:  DNR  CRITICAL CARE Performed by: Lanier Clam  Total critical care time: 42 minutes  Critical care time was exclusive of separately billable procedures and treating other patients. Critical care was necessary to treat or prevent imminent or life-threatening deterioration.  Critical care was time spent personally by me on the following activities: development of treatment plan with patient and/or surrogate as well as nursing, discussions with consultants, evaluation of patient's response to treatment, examination of patient, obtaining history from patient or surrogate, ordering and performing treatments and interventions, ordering and review of laboratory studies, ordering and review of radiographic studies, pulse oximetry and re-evaluation of patient's condition.  Tessie Fass MSN, AGACNP-BC [facility] Pulmonary/Critical Care Medicine Amion for pager 07/03/2022, 10:20 AM

## 2022-07-04 DIAGNOSIS — G9340 Encephalopathy, unspecified: Secondary | ICD-10-CM | POA: Diagnosis not present

## 2022-07-04 DIAGNOSIS — N179 Acute kidney failure, unspecified: Secondary | ICD-10-CM

## 2022-07-04 DIAGNOSIS — J441 Chronic obstructive pulmonary disease with (acute) exacerbation: Secondary | ICD-10-CM | POA: Diagnosis not present

## 2022-07-04 DIAGNOSIS — E870 Hyperosmolality and hypernatremia: Secondary | ICD-10-CM

## 2022-07-04 DIAGNOSIS — J9622 Acute and chronic respiratory failure with hypercapnia: Secondary | ICD-10-CM | POA: Diagnosis not present

## 2022-07-04 DIAGNOSIS — Z930 Tracheostomy status: Secondary | ICD-10-CM | POA: Diagnosis not present

## 2022-07-04 DIAGNOSIS — J9621 Acute and chronic respiratory failure with hypoxia: Secondary | ICD-10-CM | POA: Diagnosis not present

## 2022-07-04 LAB — GLUCOSE, CAPILLARY
Glucose-Capillary: 136 mg/dL — ABNORMAL HIGH (ref 70–99)
Glucose-Capillary: 138 mg/dL — ABNORMAL HIGH (ref 70–99)
Glucose-Capillary: 201 mg/dL — ABNORMAL HIGH (ref 70–99)
Glucose-Capillary: 213 mg/dL — ABNORMAL HIGH (ref 70–99)
Glucose-Capillary: 255 mg/dL — ABNORMAL HIGH (ref 70–99)

## 2022-07-04 LAB — URINALYSIS, ROUTINE W REFLEX MICROSCOPIC
Bilirubin Urine: NEGATIVE
Glucose, UA: NEGATIVE mg/dL
Nitrite: NEGATIVE
Protein, ur: 30 mg/dL — AB
pH: 8 (ref 5.0–8.0)

## 2022-07-04 LAB — BASIC METABOLIC PANEL
Anion gap: 10 (ref 5–15)
CO2: 32 mmol/L (ref 22–32)
Calcium: 9.8 mg/dL (ref 8.9–10.3)
Chloride: 111 mmol/L (ref 98–111)
Potassium: 3.2 mmol/L — ABNORMAL LOW (ref 3.5–5.1)

## 2022-07-04 LAB — AMMONIA: Ammonia: 19 umol/L (ref 9–35)

## 2022-07-04 LAB — MRSA NEXT GEN BY PCR, NASAL: MRSA by PCR Next Gen: NOT DETECTED

## 2022-07-04 MED ORDER — SENNA 8.6 MG PO TABS
1.0000 | ORAL_TABLET | Freq: Every day | ORAL | Status: DC
  Administered 2022-07-04 – 2022-07-12 (×4): 8.6 mg
  Filled 2022-07-04 (×6): qty 1

## 2022-07-04 MED ORDER — MIDAZOLAM HCL 2 MG/2ML IJ SOLN
2.0000 mg | INTRAMUSCULAR | Status: DC | PRN
  Administered 2022-07-04 – 2022-07-19 (×14): 2 mg via INTRAVENOUS
  Filled 2022-07-04 (×17): qty 2

## 2022-07-04 MED ORDER — POLYETHYLENE GLYCOL 3350 17 G PO PACK
17.0000 g | PACK | Freq: Every day | ORAL | Status: DC
  Administered 2022-07-04 – 2022-07-12 (×3): 17 g
  Filled 2022-07-04 (×5): qty 1

## 2022-07-04 MED ORDER — PIPERACILLIN-TAZOBACTAM 3.375 G IVPB 30 MIN
3.3750 g | Freq: Once | INTRAVENOUS | Status: AC
  Administered 2022-07-04: 3.375 g via INTRAVENOUS
  Filled 2022-07-04 (×2): qty 50

## 2022-07-04 MED ORDER — POTASSIUM CHLORIDE 20 MEQ PO PACK
60.0000 meq | PACK | Freq: Once | ORAL | Status: AC
  Administered 2022-07-04: 60 meq
  Filled 2022-07-04: qty 3

## 2022-07-04 MED ORDER — FREE WATER
200.0000 mL | Status: DC
  Administered 2022-07-04 – 2022-07-05 (×4): 200 mL

## 2022-07-04 MED ORDER — FREE WATER
200.0000 mL | Freq: Four times a day (QID) | Status: DC
  Administered 2022-07-04: 200 mL

## 2022-07-04 MED ORDER — VANCOMYCIN HCL IN DEXTROSE 1-5 GM/200ML-% IV SOLN
1000.0000 mg | INTRAVENOUS | Status: DC
  Administered 2022-07-04: 1000 mg via INTRAVENOUS
  Filled 2022-07-04: qty 200

## 2022-07-04 MED ORDER — PIPERACILLIN-TAZOBACTAM 3.375 G IVPB
3.3750 g | Freq: Three times a day (TID) | INTRAVENOUS | Status: DC
  Administered 2022-07-05 – 2022-07-06 (×4): 3.375 g via INTRAVENOUS
  Filled 2022-07-04 (×4): qty 50

## 2022-07-04 NOTE — Progress Notes (Addendum)
NAME:  Carla [name], MRN:  [id number], DOB:  1945-08-05, LOS: 22 ADMISSION DATE:  06/12/2022, CONSULTATION DATE:  06/13/2022 REFERRING MD:  Dr. Sherryll Burger, [facility], CHIEF COMPLAINT:  short of breath   History of Present Illness:  77 yo female smoker with hx of COPD from emphysema found by family unresponsive and brought to ER.  Found to have acute on chronic hypercapnia on ABG.  Intubated in ER.  She was started on therapy for COPD exacerbation.  PCCM consulted to assist with respiratory management.  Pertinent  Medical History  Hypertension, Dementia, Anxiety, Depression, GERD, Primary hyperparathyroidism, Vit D deficiency, HLD, Osteoporosis  Significant Hospital Events: Including procedures, antibiotic start and stop dates in addition to other pertinent events    3/20 Admitted after being found unresponsive, intubated on ED arrival  CT angio chest >> atherosclerosis, severe coronary calcification, severe LVH, mild centrilobular emphysema, 2 mm nodule LUL, 4 mm nodule LUL Blood culture  >> Haemophilus influenzae 3/22 Had oxygen desaturation and low Vt with SBT this morning. Echo 06/14/22 >> EF 60 to 65%, mod LVH, grade 1 DD, mild/mod AR 3/25 bronch for peak pressures, low tidal volumes with mucus cast at end of ETT, thick secretions caking tube 3/29 Bronched again with purulent secretions LLL with plugging and LLL atelectasis, some increase in O2 requirement on vent, zosyn started for VAP coverage. MRSA nare swab today negative.  3/30 Remains intubated with waxing and weaning agitation on fentanyl and precedex  3/31 Remains on vent with continued issues with agitation when sedation lightened  4/2 family meeting, made DNR, plans for one way extubation 4/5 4/5-no overnight events, plan is for one-way extubation today 4/6-family changes mind regarding one-way extubation, plan will be for tracheostomy 4/8 trach placed 4/10 failed overnight weaning trial, back on PRVC and sedation  was added.  Subsequently sedation weaned.  4/11 PSV   Interim History / Subjective:   NAEO  On PSV this morning    Objective   Blood pressure (!) 145/68, pulse (!) 109, temperature (!) 100.7 F (38.2 C), temperature source Axillary, resp. rate 18, height 5' (1.524 m), weight 44.5 kg, SpO2 96 %.    Vent Mode: PSV;CPAP FiO2 (%):  [45 %-60 %] 50 % Set Rate:  [22 bmp] 22 bmp Vt Set:  [360 mL] 360 mL PEEP:  [5 cmH20] 5 cmH20 Pressure Support:  [12 cmH20] 12 cmH20 Plateau Pressure:  [12 cmH20-21 cmH20] 20 cmH20   Intake/Output Summary (Last 24 hours) at 07/04/2022 1014 Last data filed at 07/04/2022 1000 Gross per 24 hour  Intake 1070 ml  Output 2050 ml  Net -980 ml   Filed Weights   07/02/22 0500 07/03/22 0326 07/04/22 0349  Weight: 44.3 kg 43.9 kg 44.5 kg   Examination: Gen:   Chronically and acutely ill elderly F trach/vent NAD  HEENT: trach secure anicteric sclera  Lungs:   Symmetrical chest expansion no accessory use on PSV  CV:      s1s2 cap refill < 3sec  Abd:   Thin ndnt  Ext: Decreased muscle and fat mass  Skin:     c/d/w. Flushed face  Neuro:   Moving spontaneously. Not following commands. L pupil is 3mm, Pt with strong resistance to R eye opening and could not assess today   Resolved Hospital Problem list    H Flu bacteremia Septic shock due to H Flu bacteremia, H Flu PNA   Assessment & Plan:   Acute encephalopathy superimposed on chronic dementia Hx Anxiety  -  medication related, ICU delirium P -cont weaning CNS depressing meds -delirium precautions -PT/OT -will check an ammonia  -Consider CT H if mentation not improving   AoC hypoxic and hypercarbic resp failure S/p tracheostomy COPD Hx tobacco use Recent Hflu PNA  P -wean vent -- tolerating PSV 4/11, goal ATC during day rest on vent overnight  -VAP, pulm hygiene  -following trach asp but overall assuring against HCAP at this time  -think she will benefit from LTAC   Hx HTN Shock --> vasoplegia - improved   Demand ischemia  -dc midodrine  -holding antihypertensives , add back as indicated   AKI Contraction alkalosis  Hypernatremia P -sending BMP  -trending UOP  -holding diuretic -adding FWF   Inadequate PO intake  P -EN via cortrak -defer PEG eval for now, think we should decr sedating meds first in case her mentation improves enough for PO   Urinary retention  -foley replaced 4/9 P -Bethanechol -void trail 4/12   Constipation P -incr bowel reg 4/11  L/T/D -Janina Mayo  (4/8)- maintain -LUE PICC (3/29) - apparently no suitable PIV sites so PICC remains. Will cont to reassess as she has no other indication for central access  -foley (4/9) - cont, urinary retention    Best Practice (right click and "Reselect all SmartList Selections" daily)   Diet/type: tubefeeds  DVT prophylaxis: LMWH GI prophylaxis: PPI Lines: Central line -- LUE PICC  Foley:  Yes, and it is still needed Code Status:  DNR  CRITICAL CARE Performed by: Lanier Clam   Total critical care time: 36 minutes  Critical care time was exclusive of separately billable procedures and treating other patients. Critical care was necessary to treat or prevent imminent or life-threatening deterioration.  Critical care was time spent personally by me on the following activities: development of treatment plan with patient and/or surrogate as well as nursing, discussions with consultants, evaluation of patient's response to treatment, examination of patient, obtaining history from patient or surrogate, ordering and performing treatments and interventions, ordering and review of laboratory studies, ordering and review of radiographic studies, pulse oximetry and re-evaluation of patient's condition.  Tessie Fass MSN, AGACNP-BC [facility] Pulmonary/Critical Care Medicine Amion for pager  07/04/2022, 10:14 AM

## 2022-07-04 NOTE — Progress Notes (Addendum)
SLP Cancellation Note  Patient Details Name: Carla [name] MRN: [id number] DOB: 04-12-1945   Cancelled treatment:       Reason Eval/Treat Not Completed: Patient not medically ready. Pt still on vent and per chart, not following commands. Will f/u as able.     Mahala Menghini., M.A. CCC-SLP Acute Rehabilitation Services Office [phone]  Secure chat preferred  07/04/2022, 7:44 AM

## 2022-07-05 DIAGNOSIS — J9602 Acute respiratory failure with hypercapnia: Secondary | ICD-10-CM | POA: Diagnosis not present

## 2022-07-05 DIAGNOSIS — R509 Fever, unspecified: Secondary | ICD-10-CM

## 2022-07-05 LAB — CULTURE, RESPIRATORY W GRAM STAIN: Culture: NORMAL

## 2022-07-05 LAB — CBC
MCH: 31 pg (ref 26.0–34.0)
MCHC: 31.2 g/dL (ref 30.0–36.0)
RBC: 3.84 MIL/uL — ABNORMAL LOW (ref 3.87–5.11)
RDW: 15.4 % (ref 11.5–15.5)

## 2022-07-05 LAB — GLUCOSE, CAPILLARY
Glucose-Capillary: 146 mg/dL — ABNORMAL HIGH (ref 70–99)
Glucose-Capillary: 119 mg/dL — ABNORMAL HIGH (ref 70–99)
Glucose-Capillary: 186 mg/dL — ABNORMAL HIGH (ref 70–99)
Glucose-Capillary: 116 mg/dL — ABNORMAL HIGH (ref 70–99)

## 2022-07-05 LAB — BASIC METABOLIC PANEL
Anion gap: 6 (ref 5–15)
BUN: 80 mg/dL — ABNORMAL HIGH (ref 8–23)
Chloride: 115 mmol/L — ABNORMAL HIGH (ref 98–111)
Creatinine, Ser: 1.13 mg/dL — ABNORMAL HIGH (ref 0.44–1.00)
Potassium: 3.9 mmol/L (ref 3.5–5.1)
Sodium: 155 mmol/L — ABNORMAL HIGH (ref 135–145)

## 2022-07-05 MED ORDER — DEXTROSE 5 % IV SOLN: INTRAVENOUS | Status: DC

## 2022-07-05 MED ORDER — FREE WATER
300.0000 mL | Status: DC
  Administered 2022-07-05 – 2022-07-06 (×6): 300 mL

## 2022-07-05 NOTE — Progress Notes (Addendum)
Speech Language Pathology  Patient Details Name: Carla [name] MRN: [id number] DOB: 07/05/1945 Today's Date: 07/05/2022 Time:  -     Spoke with RN. Pt is not sedated and weaning on vent but RN stated she seems to become agitated. She does not follow commands or attempt to speak. Does not appear like a good candidate to attempt a vent valve with at this time. Will continue to follow.                               Ulrich Soules Willis  07/05/2022, 9:14 AM 

## 2022-07-05 NOTE — Progress Notes (Incomplete Revision)
Speech Language Pathology  Patient Details Name: Carla [name] MRN: [id number] DOB: 07/05/1945 Today's Date: 07/05/2022 Time:  -     Spoke with RN. Pt is not sedated and weaning on vent but RN stated she seems to become agitated. She does not follow commands or attempt to speak. Does not appear like a good candidate to attempt a vent valve with at this time. Will continue to follow.                               Carla [name]  07/05/2022, 9:14 AM 

## 2022-07-05 NOTE — Progress Notes (Addendum)
NAME:  Carla [name], MRN:  [id number], DOB:  07-24-1945, LOS: 23 ADMISSION DATE:  06/12/2022, CONSULTATION DATE:  06/13/2022 REFERRING MD:  Dr. Sherryll Burger, [facility], CHIEF COMPLAINT:  short of breath   History of Present Illness:  77 yo female smoker with hx of COPD from emphysema found by family unresponsive and brought to ER.  Found to have acute on chronic hypercapnia on ABG.  Intubated in ER.  She was started on therapy for COPD exacerbation.  PCCM consulted to assist with respiratory management.  Pertinent  Medical History  Hypertension, Dementia, Anxiety, Depression, GERD, Primary hyperparathyroidism, Vit D deficiency, HLD, Osteoporosis  Significant Hospital Events: Including procedures, antibiotic start and stop dates in addition to other pertinent events    3/20 Admitted after being found unresponsive, intubated on ED arrival  CT angio chest >> atherosclerosis, severe coronary calcification, severe LVH, mild centrilobular emphysema, 2 mm nodule LUL, 4 mm nodule LUL Blood culture  >> Haemophilus influenzae 3/22 Had oxygen desaturation and low Vt with SBT this morning. Echo 06/14/22 >> EF 60 to 65%, mod LVH, grade 1 DD, mild/mod AR 3/25 bronch for peak pressures, low tidal volumes with mucus cast at end of ETT, thick secretions caking tube 3/29 Bronched again with purulent secretions LLL with plugging and LLL atelectasis, some increase in O2 requirement on vent, zosyn started for VAP coverage. MRSA nare swab today negative.  3/30 Remains intubated with waxing and weaning agitation on fentanyl and precedex  3/31 Remains on vent with continued issues with agitation when sedation lightened  4/2 family meeting, made DNR, plans for one way extubation 4/5 4/5-no overnight events, plan is for one-way extubation today 4/6-family changes mind regarding one-way extubation, plan will be for tracheostomy 4/8 trach placed 4/10 failed overnight weaning trial, back on PRVC and sedation  was added.  Subsequently sedation weaned and enteral agents were dc and or decreased.  4/11 failed psv w some distress. Fever. Abx added 4/12 cont efforts at psv. Most awake she has been in days    Interim History / Subjective:   Yesterday afternoon w new fever Started on abx and cx drawn  Na uptrending now 153 despite adding fwf  NAEO  Trach aspo from 4/10 w [facility] in pairs -- prior from 4/6 was [facility], normal flora    Objective   Blood pressure 136/71, pulse 83, temperature 97.9 F (36.6 C), temperature source Other (Comment), resp. rate 19, height 5' (1.524 m), weight 43.7 kg, SpO2 99 %.    Vent Mode: PRVC FiO2 (%):  [40 %-50 %] 50 % Set Rate:  [12 bmp-22 bmp] 12 bmp Vt Set:  [360 mL-500 mL] 500 mL PEEP:  [5 cmH20] 5 cmH20 Plateau Pressure:  [18 cmH20-30 cmH20] 30 cmH20   Intake/Output Summary (Last 24 hours) at 07/05/2022 0928 Last data filed at 07/05/2022 4098 Gross per 24 hour  Intake 1474.79 ml  Output 2045 ml  Net -570.21 ml   Filed Weights   07/03/22 0326 07/04/22 0349 07/05/22 0500  Weight: 43.9 kg 44.5 kg 43.7 kg   Examination: Gen:   Critically and chronically ill frail elderly F  HEENT:   Cortrak. Trach with thick yellow secretions  Lungs:  Symmetrical chest expansion on PSV.   CV:    rrr cap refill < 3 sec    Abd:  thin ndnt  Ext: Decreased muscle mass, fat mass. No acute deformity  Skin:     friable, some covered skin tears  Neuro:  Moving spontaneously and  semi purposefully. Not following commands but does open eyes to stimulation.  Resolved Hospital Problem list    H Flu bacteremia Septic shock due to H Flu bacteremia, H Flu PNA   Assessment & Plan:    Acute encephalopathy superimposed on chronic dementia Hx anxiety Possible opiate/bzd withdrawal  -medication related, ICU delirium P -cont weaning CNS depressing meds -delirium precautions -PT/OT -will check an ammonia  -Consider CT H if mentation not improving   AoC hypoxic and hypercarbic resp failure S/p  tracheostomy COPD Hx tobacco use -recent Hflu PNA  P -wean vent -- PSV as tolerated w goal ATC  -VAP, pulm hygiene  -following trach asp  -think she will benefit from LTAC   Fever -concern for sepsis -- Trach asp from 4/10 in [facility] in pairs (4/6 was GPCs deemed normal flora so not sure that this is actually different)  Has had a PICC for >10 days that apparently cannot be removed as there are no suitable PIV sites per IV team which is unfortunate. Has a foley, but short term  P -cont abx, follow cx data  - as have been, cont trying for PIV sites so PICC can dc -consider alt fever sources but doesn't have obvious drug fever source, has been on vte ppx   Hx HTN Demand ischemia  P -defer adding antihypertensives for now, some variability in pressures   AKI  Hypernatremia Contraction alkalosis  P -follow renal indices, UOP -cont FWF, adding d5 -holding diuresis   Inadequate PO intake  P -EN via cortrak -defer PEG eval for now, think we should decr sedating meds first in case her mentation improves enough for PO   Urinary retention  -foley replaced 4/9 P -Bethanechol -void trail 4/12   Constipation P -incr bowel reg 4/11  L/T/D -Janina Mayo  (4/8)- maintain -LUE PICC (3/29) - cont trying for PIV placement so we can dc this  -foley (4/9) - dc 4/12 for void trial    Best Practice (right click and "Reselect all SmartList Selections" daily)   Diet/type: tubefeeds  DVT prophylaxis: LMWH GI prophylaxis: PPI Lines: Central line -- LUE PICC  Foley:  Yes, and it is still needed Code Status:  DNR  CRITICAL CARE Performed by: Lanier Clam   Total critical care time: 42 minutes  Critical care time was exclusive of separately billable procedures and treating other patients. Critical care was necessary to treat or prevent imminent or life-threatening deterioration.  Critical care was time spent personally by me on the following activities: development of treatment plan  with patient and/or surrogate as well as nursing, discussions with consultants, evaluation of patient's response to treatment, examination of patient, obtaining history from patient or surrogate, ordering and performing treatments and interventions, ordering and review of laboratory studies, ordering and review of radiographic studies, pulse oximetry and re-evaluation of patient's condition.  Tessie Fass MSN, AGACNP-BC [facility] Pulmonary/Critical Care Medicine Amion for pager  07/05/2022, 9:28 AM

## 2022-07-06 DIAGNOSIS — J9602 Acute respiratory failure with hypercapnia: Secondary | ICD-10-CM | POA: Diagnosis not present

## 2022-07-06 LAB — BASIC METABOLIC PANEL
Calcium: 9.5 mg/dL (ref 8.9–10.3)
Calcium: 9.3 mg/dL (ref 8.9–10.3)
Calcium: 9.5 mg/dL (ref 8.9–10.3)
Chloride: 102 mmol/L (ref 98–111)
Chloride: 102 mmol/L (ref 98–111)
Creatinine, Ser: 1.01 mg/dL — ABNORMAL HIGH (ref 0.44–1.00)
GFR, Estimated: 57 mL/min — ABNORMAL LOW (ref >60)
GFR, Estimated: 56 mL/min — ABNORMAL LOW (ref >60)
Glucose, Bld: 218 mg/dL — ABNORMAL HIGH (ref 70–99)
Potassium: 3.3 mmol/L — ABNORMAL LOW (ref 3.5–5.1)
Potassium: 4.4 mmol/L (ref 3.5–5.1)
Sodium: 140 mmol/L (ref 135–145)
Sodium: 140 mmol/L (ref 135–145)

## 2022-07-06 LAB — GLUCOSE, CAPILLARY
Glucose-Capillary: 234 mg/dL — ABNORMAL HIGH (ref 70–99)
Glucose-Capillary: 122 mg/dL — ABNORMAL HIGH (ref 70–99)
Glucose-Capillary: 173 mg/dL — ABNORMAL HIGH (ref 70–99)
Glucose-Capillary: 178 mg/dL — ABNORMAL HIGH (ref 70–99)

## 2022-07-06 MED ORDER — POTASSIUM CHLORIDE 20 MEQ PO PACK
20.0000 meq | PACK | ORAL | Status: AC
  Administered 2022-07-06 (×2): 20 meq
  Filled 2022-07-06 (×3): qty 1

## 2022-07-06 MED ORDER — SODIUM CHLORIDE 0.9 % IV SOLN
2.0000 g | INTRAVENOUS | Status: AC
  Administered 2022-07-06 – 2022-07-08 (×3): 2 g via INTRAVENOUS
  Filled 2022-07-06 (×3): qty 20

## 2022-07-06 MED ORDER — FREE WATER
200.0000 mL | Freq: Four times a day (QID) | Status: DC
  Administered 2022-07-06 – 2022-07-07 (×4): 200 mL

## 2022-07-06 MED ORDER — FUROSEMIDE 10 MG/ML IJ SOLN
40.0000 mg | Freq: Once | INTRAMUSCULAR | Status: AC
  Administered 2022-07-06: 40 mg via INTRAVENOUS
  Filled 2022-07-06: qty 4

## 2022-07-06 MED ORDER — POTASSIUM CHLORIDE 10 MEQ/50ML IV SOLN
10.0000 meq | INTRAVENOUS | Status: AC
  Administered 2022-07-06 (×4): 10 meq via INTRAVENOUS
  Filled 2022-07-06 (×4): qty 50

## 2022-07-06 NOTE — Progress Notes (Addendum)
NAME:  Carla [name], MRN:  [id number], DOB:  1946-01-04, LOS: 23 ADMISSION DATE:  06/12/2022, CONSULTATION DATE:  06/13/2022 REFERRING MD:  Dr. Sherryll Burger, [facility], CHIEF COMPLAINT:  short of breath   History of Present Illness:  77 yo female smoker with hx of COPD from emphysema found by family unresponsive and brought to ER.  Found to have acute on chronic hypercapnia on ABG.  Intubated in ER.  She was started on therapy for COPD exacerbation.  PCCM consulted to assist with respiratory management.  Pertinent  Medical History  Hypertension, Dementia, Anxiety, Depression, GERD, Primary hyperparathyroidism, Vit D deficiency, HLD, Osteoporosis  Significant Hospital Events: Including procedures, antibiotic start and stop dates in addition to other pertinent events    3/20 Admitted after being found unresponsive, intubated on ED arrival  CT angio chest >> atherosclerosis, severe coronary calcification, severe LVH, mild centrilobular emphysema, 2 mm nodule LUL, 4 mm nodule LUL Blood culture  >> Haemophilus influenzae 3/22 Had oxygen desaturation and low Vt with SBT this morning. Echo 06/14/22 >> EF 60 to 65%, mod LVH, grade 1 DD, mild/mod AR 3/25 bronch for peak pressures, low tidal volumes with mucus cast at end of ETT, thick secretions caking tube 3/29 Bronched again with purulent secretions LLL with plugging and LLL atelectasis, some increase in O2 requirement on vent, zosyn started for VAP coverage. MRSA nare swab today negative.  3/30 Remains intubated with waxing and weaning agitation on fentanyl and precedex  3/31 Remains on vent with continued issues with agitation when sedation lightened  4/2 family meeting, made DNR, plans for one way extubation 4/5 4/5-no overnight events, plan is for one-way extubation today 4/6-family changes mind regarding one-way extubation, plan will be for tracheostomy 4/8 trach placed 4/10 failed overnight weaning trial, back on PRVC and sedation  was added.  Subsequently sedation weaned and enteral agents were dc and or decreased.  4/11 failed psv w some distress. Fever. Abx added 4/12 cont efforts at psv. Most awake she has been in days    Interim History / Subjective:   Afebrile >24 hours Na improved with increased FWF and D5 x 10 hours Tolerated PS yesterday   Objective   Blood pressure 136/71, pulse 83, temperature 97.9 F (36.6 C), temperature source Other (Comment), resp. rate 19, height 5' (1.524 m), weight 43.7 kg, SpO2 99 %.    Vent Mode: PRVC FiO2 (%):  [40 %-50 %] 50 % Set Rate:  [12 bmp-22 bmp] 12 bmp Vt Set:  [360 mL-500 mL] 500 mL PEEP:  [5 cmH20] 5 cmH20 Plateau Pressure:  [18 cmH20-30 cmH20] 30 cmH20   Intake/Output Summary (Last 24 hours) at 07/05/2022 0928 Last data filed at 07/05/2022 4098 Gross per 24 hour  Intake 1474.79 ml  Output 2045 ml  Net -570.21 ml   Filed Weights   07/03/22 0326 07/04/22 0349 07/05/22 0500  Weight: 43.9 kg 44.5 kg 43.7 kg   Examination: Gen:   Critically and chronically ill frail elderly F  HEENT:   Cortrak. Trach with thick yellow secretions  Lungs:  Symmetrical chest expansion on PSV.   CV:    rrr cap refill < 3 sec    Abd:  thin ndnt  Ext: Decreased muscle mass, fat mass. No acute deformity  Skin:     friable, some covered skin tears  Neuro:  Moving spontaneously and semi purposefully. Not following commands but does open eyes to stimulation.  Physical Exam: General: Well-appearing, no acute distress HENT: [place], AT, OP  clear, MMM Eyes: EOMI, no scleral icterus Respiratory: Clear to auscultation bilaterally.  No crackles, wheezing or rales Cardiovascular: RRR, -M/R/G, no JVD GI: BS+, soft, nontender Extremities:-Edema,-tenderness Neuro: AAO x4, CNII-XII grossly intact Skin: Intact, no rashes or bruising Psych: Normal mood, normal affect GU: Foley in place   K 3.3 BUN/Cr 69/1.01 Respiratory culture 4/10 normal flora  Resolved Hospital Problem list    H Flu  bacteremia Septic shock due to H Flu bacteremia, H Flu PNA   Assessment & Plan:    Acute encephalopathy superimposed on chronic dementia Hx anxiety Possible opiate/bzd withdrawal  -medication related, ICU delirium -ammonia wnl P -cont weaning CNS depressing meds: klonpin 1 mg BID -delirium precautions -PT/OT -Consider CT H if mentation not improving   AoC hypoxic and hypercarbic resp failure S/p tracheostomy COPD Hx tobacco use -recent Hflu PNA  P -Wean vent -- PSV as tolerated w goal ATC  -VAP, pulm hygiene with triple nebs -De-escalate to Ceftriaxone for total 5-7 days -think she will benefit from LTAC   Fever 4/11 -concern for sepsis -- Trach asp from 4/10 in [facility] in pairs (4/6 was GPCs deemed normal flora so not sure that this is actually different)  Has had a PICC for >10 days that apparently cannot be removed as there are no suitable PIV sites per IV team which is unfortunate. Has a foley, but short term  P -De-escalate abx, follow cx data  -PICC line remains in place, no alternative PIV sites -consider alt fever sources but doesn't have obvious drug fever source, has been on vte ppx   Hx HTN Demand ischemia  P -defer adding antihypertensives for now, some variability in pressures   AKI  Hypernatremia - resolved Contraction alkalosis  P -follow renal indices, UOP -Reduce FWF -Gentle diuresis when able  Inadequate PO intake  P -EN via cortrak -defer PEG eval for now, think we should decr sedating meds first in case her mentation improves enough for PO   Urinary retention  -foley replaced 4/9 P -Bethanechol -void trail 4/12   Constipation P -incr bowel reg 4/11  L/T/D -Janina Mayo  (4/8)- maintain -LUE PICC (3/29) - cont trying for PIV placement so we can dc this  -foley (4/9) - dc 4/12 for void trial    Best Practice (right click and "Reselect all SmartList Selections" daily)   Diet/type: tubefeeds  DVT prophylaxis: LMWH GI prophylaxis:  PPI Lines: Central line -- LUE PICC  Foley:  Yes, and it is still needed Code Status:  DNR  The patient is critically ill with multiple organ systems failure and requires high complexity decision making for assessment and support, frequent evaluation and titration of therapies, application of advanced monitoring technologies and extensive interpretation of multiple databases.  Independent Critical Care Time: 45 Minutes.   Mechele Collin, M.D. [facility] Pulmonary/Critical Care Medicine 07/06/2022 8:39 AM   Please see Amion for pager number to reach on-call Pulmonary and Critical Care Team.

## 2022-07-07 DIAGNOSIS — J9602 Acute respiratory failure with hypercapnia: Secondary | ICD-10-CM | POA: Diagnosis not present

## 2022-07-07 LAB — GLUCOSE, CAPILLARY
Glucose-Capillary: 188 mg/dL — ABNORMAL HIGH (ref 70–99)
Glucose-Capillary: 156 mg/dL — ABNORMAL HIGH (ref 70–99)
Glucose-Capillary: 161 mg/dL — ABNORMAL HIGH (ref 70–99)
Glucose-Capillary: 189 mg/dL — ABNORMAL HIGH (ref 70–99)
Glucose-Capillary: 137 mg/dL — ABNORMAL HIGH (ref 70–99)

## 2022-07-07 LAB — CBC
Hemoglobin: 10.7 g/dL — ABNORMAL LOW (ref 12.0–15.0)
MCH: 30.2 pg (ref 26.0–34.0)
Platelets: 372 10*3/uL (ref 150–400)
RDW: 14.8 % (ref 11.5–15.5)
WBC: 8.2 10*3/uL (ref 4.0–10.5)
nRBC: 0.2 % (ref 0.0–0.2)

## 2022-07-07 LAB — BASIC METABOLIC PANEL
Anion gap: 12 (ref 5–15)
Anion gap: 11 (ref 5–15)
BUN: 75 mg/dL — ABNORMAL HIGH (ref 8–23)
CO2: 31 mmol/L (ref 22–32)
Calcium: 9.9 mg/dL (ref 8.9–10.3)
Calcium: 10 mg/dL (ref 8.9–10.3)
Chloride: 103 mmol/L (ref 98–111)
Creatinine, Ser: 0.9 mg/dL (ref 0.44–1.00)
Glucose, Bld: 106 mg/dL — ABNORMAL HIGH (ref 70–99)
Potassium: 4.3 mmol/L (ref 3.5–5.1)
Sodium: 146 mmol/L — ABNORMAL HIGH (ref 135–145)

## 2022-07-07 MED ORDER — FREE WATER
200.0000 mL | Status: DC
  Administered 2022-07-07 – 2022-07-13 (×30): 200 mL

## 2022-07-07 MED ORDER — FUROSEMIDE 10 MG/ML IJ SOLN
40.0000 mg | Freq: Once | INTRAMUSCULAR | Status: AC
  Administered 2022-07-07: 40 mg via INTRAVENOUS
  Filled 2022-07-07: qty 4

## 2022-07-07 MED ORDER — POTASSIUM CHLORIDE 20 MEQ PO PACK
40.0000 meq | PACK | Freq: Once | ORAL | Status: AC
  Administered 2022-07-07: 40 meq
  Filled 2022-07-07: qty 2

## 2022-07-07 NOTE — Progress Notes (Addendum)
NAME:  ALEXIUS SWINDERMAN, MRN:  [id number], DOB:  1945-05-04, LOS: 25 ADMISSION DATE:  06/12/2022, CONSULTATION DATE:  06/13/2022 REFERRING MD:  Dr. Sherryll Burger, [facility], CHIEF COMPLAINT:  short of breath   History of Present Illness:  77 yo female smoker with hx of COPD from emphysema found by family unresponsive and brought to ER.  Found to have acute on chronic hypercapnia on ABG.  Intubated in ER.  She was started on therapy for COPD exacerbation.  PCCM consulted to assist with respiratory management.  Pertinent  Medical History  Hypertension, Dementia, Anxiety, Depression, GERD, Primary hyperparathyroidism, Vit D deficiency, HLD, Osteoporosis  Significant Hospital Events: Including procedures, antibiotic start and stop dates in addition to other pertinent events    3/20 Admitted after being found unresponsive, intubated on ED arrival  CT angio chest >> atherosclerosis, severe coronary calcification, severe LVH, mild centrilobular emphysema, 2 mm nodule LUL, 4 mm nodule LUL Blood culture  >> Haemophilus influenzae 3/22 Had oxygen desaturation and low Vt with SBT this morning. Echo 06/14/22 >> EF 60 to 65%, mod LVH, grade 1 DD, mild/mod AR 3/25 bronch for peak pressures, low tidal volumes with mucus cast at end of ETT, thick secretions caking tube 3/29 Bronched again with purulent secretions LLL with plugging and LLL atelectasis, some increase in O2 requirement on vent, zosyn started for VAP coverage. MRSA nare swab today negative.  3/30 Remains intubated with waxing and weaning agitation on fentanyl and precedex  3/31 Remains on vent with continued issues with agitation when sedation lightened  4/2 family meeting, made DNR, plans for one way extubation 4/5 4/5-no overnight events, plan is for one-way extubation today 4/6-family changes mind regarding one-way extubation, plan will be for tracheostomy 4/8 trach placed 4/10 failed overnight weaning trial, back on PRVC and sedation  was added.  Subsequently sedation weaned and enteral agents were dc and or decreased.  4/11 failed psv w some distress. Fever. Abx added 4/12 cont efforts at psv. Most awake she has been in days   4/13 Tolerating PS.   Interim History / Subjective:   Diuresed well yesterday. CFB +11L Objective   Blood pressure 121/62, pulse 82, temperature 98.1 F (36.7 C), temperature source Axillary, resp. rate (!) 21, height 5' (1.524 m), weight 43.1 kg, SpO2 97 %.    Vent Mode: PSV;CPAP FiO2 (%):  [40 %] 40 % Set Rate:  [12 bmp] 12 bmp Vt Set:  [500 mL] 500 mL PEEP:  [5 cmH20] 5 cmH20 Pressure Support:  [10 cmH20] 10 cmH20 Plateau Pressure:  [17 cmH20-21 cmH20] 21 cmH20   Intake/Output Summary (Last 24 hours) at 07/07/2022 0829 Last data filed at 07/07/2022 0800 Gross per 24 hour  Intake 2483.79 ml  Output 2895 ml  Net -411.21 ml   Filed Weights   07/05/22 0500 07/06/22 0500 07/07/22 0500  Weight: 43.7 kg 45.1 kg 43.1 kg   Physical Exam: General: Critically ill and elderly-appearing, no acute distress HENT: [place], AT, ETT in place Eyes: EOMI, no scleral icterus Respiratory: Diminished but clear to auscultation bilaterally.  No crackles, wheezing or rales Cardiovascular: RRR, -M/R/G, no JVD GI: BS+, soft, nontender Extremities:-Edema,-tenderness Neuro: Drowsy but easily awakened and follows commands, CNII-XII grossly intact, moves extremities x 4  K 3.7 BUN/Cr 63/0.90 Respiratory culture 4/10 normal flora  Resolved Hospital Problem list    H Flu bacteremia Septic shock due to H Flu bacteremia, H Flu PNA   Assessment & Plan:    Acute encephalopathy superimposed on chronic  dementia - improving Hx anxiety Possible opiate/bzd withdrawal  -medication related, ICU delirium -ammonia wnl P -cont weaning CNS depressing meds: klonpin 1 mg BID -delirium precautions -PT/OT  AoC hypoxic and hypercarbic resp failure S/p tracheostomy COPD Hx tobacco use -recent Hflu PNA  P -Wean vent -- PSV as  tolerated w goal ATC  -VAP, pulm hygiene with triple nebs -Ceftriaxone for total 5-7 antibiotic days -think she will benefit from LTAC   Fever 4/11 -concern for sepsis -- Trach asp from 4/10 in [facility] in pairs (4/6 was GPCs deemed normal flora so not sure that this is actually different)  Has had a PICC for >10 days that apparently cannot be removed as there are no suitable PIV sites per IV team which is unfortunate. Has a foley, but short term  P -De-escalate abx, follow cx data  -PICC line remains in place, no alternative PIV sites -consider alt fever sources but doesn't have obvious drug fever source, has been on vte ppx   Hx HTN Demand ischemia  P -defer adding antihypertensives for now, some variability in pressures   AKI  Hypernatremia - resolved Contraction alkalosis  P -follow renal indices, UOP -Increase FWF -Gentle diuresis when able  Inadequate PO intake  P -EN via cortrak -defer PEG eval for now, think we should decr sedating meds first in case her mentation improves enough for PO   Urinary retention  -foley replaced 4/9 and 3/13 P -Continue foley  Constipation P -incr bowel reg 4/11  L/T/D -Janina Mayo  (4/8)- maintain -LUE PICC (3/29) - cont trying for PIV placement so we can dc this  -foley (4/9) - dc 4/12 for void trial    Best Practice (right click and "Reselect all SmartList Selections" daily)   Diet/type: tubefeeds  DVT prophylaxis: LMWH GI prophylaxis: PPI Lines: Central line -- LUE PICC  Foley:  Yes, and it is still needed Code Status:  DNR   The patient is critically ill with multiple organ systems failure and requires high complexity decision making for assessment and support, frequent evaluation and titration of therapies, application of advanced monitoring technologies and extensive interpretation of multiple databases.  Independent Critical Care Time: 40 Minutes.   Mechele Collin, M.D. [facility] Pulmonary/Critical Care Medicine 07/07/2022 8:39  AM   Please see Amion for pager number to reach on-call Pulmonary and Critical Care Team.

## 2022-07-08 DIAGNOSIS — J9602 Acute respiratory failure with hypercapnia: Secondary | ICD-10-CM | POA: Diagnosis not present

## 2022-07-08 LAB — CBC
Hemoglobin: 11.9 g/dL — ABNORMAL LOW (ref 12.0–15.0)
MCHC: 30.7 g/dL (ref 30.0–36.0)
Platelets: 424 10*3/uL — ABNORMAL HIGH (ref 150–400)
RBC: 3.95 MIL/uL (ref 3.87–5.11)
RDW: 15 % (ref 11.5–15.5)
WBC: 10.2 10*3/uL (ref 4.0–10.5)

## 2022-07-08 LAB — GLUCOSE, CAPILLARY
Glucose-Capillary: 138 mg/dL — ABNORMAL HIGH (ref 70–99)
Glucose-Capillary: 169 mg/dL — ABNORMAL HIGH (ref 70–99)

## 2022-07-08 LAB — MAGNESIUM: Magnesium: 2.4 mg/dL (ref 1.7–2.4)

## 2022-07-08 LAB — BASIC METABOLIC PANEL
Anion gap: 10 (ref 5–15)
Chloride: 105 mmol/L (ref 98–111)
GFR, Estimated: >60 mL/min (ref >60)
Glucose, Bld: 115 mg/dL — ABNORMAL HIGH (ref 70–99)
Sodium: 146 mmol/L — ABNORMAL HIGH (ref 135–145)

## 2022-07-08 MED ORDER — OXYCODONE HCL 5 MG PO TABS
5.0000 mg | ORAL_TABLET | Freq: Three times a day (TID) | ORAL | Status: DC | PRN
  Administered 2022-07-08: 5 mg
  Filled 2022-07-08: qty 1

## 2022-07-08 MED ORDER — BETHANECHOL CHLORIDE 10 MG PO TABS
10.0000 mg | ORAL_TABLET | Freq: Three times a day (TID) | ORAL | Status: DC
  Administered 2022-07-08 – 2022-07-10 (×6): 10 mg
  Filled 2022-07-08 (×9): qty 1

## 2022-07-08 MED ORDER — BETHANECHOL CHLORIDE 10 MG PO TABS
10.0000 mg | ORAL_TABLET | Freq: Three times a day (TID) | ORAL | Status: DC
  Filled 2022-07-08: qty 1

## 2022-07-08 MED ORDER — POTASSIUM CHLORIDE 20 MEQ PO PACK
40.0000 meq | PACK | Freq: Once | ORAL | Status: AC
  Administered 2022-07-08: 40 meq
  Filled 2022-07-08: qty 2

## 2022-07-08 NOTE — Progress Notes (Incomplete Revision)
SLP Cancellation Note  Patient Details Name: Carla [name] MRN: [id number] DOB: 06/09/1945   Cancelled evaluation: Pt was on TC this am, back on vent due to increased RR and accessory muscle use. D/W RN. Not appropriate for inline vent valve. Will follow along.  Orlen Leedy L. Malorie Bigford, MA CCC/SLP Clinical Specialist - Acute Care SLP Acute Rehabilitation Services Office number [phone]           [name] 07/08/2022, 9:39 AM 

## 2022-07-08 NOTE — Progress Notes (Addendum)
NAME:  Carla [name], MRN:  [id number], DOB:  15-May-1945, LOS: 26 ADMISSION DATE:  06/12/2022, CONSULTATION DATE:  06/13/2022 REFERRING MD:  Dr. Sherryll Burger, [facility], CHIEF COMPLAINT:  short of breath   History of Present Illness:  77 yo female smoker with hx of COPD from emphysema found by family unresponsive and brought to ER.  Found to have acute on chronic hypercapnia on ABG.  Intubated in ER.  She was started on therapy for COPD exacerbation.  PCCM consulted to assist with respiratory management.  Pertinent  Medical History  Hypertension, Dementia, Anxiety, Depression, GERD, Primary hyperparathyroidism, Vit D deficiency, HLD, Osteoporosis  Significant Hospital Events: Including procedures, antibiotic start and stop dates in addition to other pertinent events    3/20 Admitted after being found unresponsive, intubated on ED arrival  CT angio chest >> atherosclerosis, severe coronary calcification, severe LVH, mild centrilobular emphysema, 2 mm nodule LUL, 4 mm nodule LUL Blood culture  >> Haemophilus influenzae 3/22 Had oxygen desaturation and low Vt with SBT this morning. Echo 06/14/22 >> EF 60 to 65%, mod LVH, grade 1 DD, mild/mod AR 3/25 bronch for peak pressures, low tidal volumes with mucus cast at end of ETT, thick secretions caking tube 3/29 Bronched again with purulent secretions LLL with plugging and LLL atelectasis, some increase in O2 requirement on vent, zosyn started for VAP coverage. MRSA nare swab today negative.  3/30 Remains intubated with waxing and weaning agitation on fentanyl and precedex  3/31 Remains on vent with continued issues with agitation when sedation lightened  4/2 family meeting, made DNR, plans for one way extubation 4/5 4/5-no overnight events, plan is for one-way extubation today 4/6-family changes mind regarding one-way extubation, plan will be for tracheostomy 4/8 trach placed 4/10 failed overnight weaning trial, back on PRVC and sedation  was added.  Subsequently sedation weaned and enteral agents were dc and or decreased.  4/11 failed psv w some distress. Fever. Abx added 4/12 cont efforts at psv. Most awake she has been in days   4/13 Tolerating PS.  4/15 Has been on ATC x 1:45 minutes, RR 40 and accessory muscle use, placing back on vent  Interim History / Subjective:   Diuresed well yesterday. CFB +11L Na 146 today despite increasing free water 4/14 to 200 cc Q 4 Very anxious Will change Oxy to 5 mg prn vs scheduled Objective   Blood pressure 131/66, pulse 96, temperature 98.3 F (36.8 C), temperature source Axillary, resp. rate 20, height 5' (1.524 m), weight 42.3 kg, SpO2 99 %.    Vent Mode: PRVC FiO2 (%):  [40 %] 40 % Set Rate:  [12 bmp] 12 bmp Vt Set:  [500 mL] 500 mL PEEP:  [5 cmH20] 5 cmH20 Pressure Support:  [10 cmH20] 10 cmH20 Plateau Pressure:  [16 cmH20-18 cmH20] 16 cmH20   Intake/Output Summary (Last 24 hours) at 07/08/2022 0752 Last data filed at 07/08/2022 0600 Gross per 24 hour  Intake 1815 ml  Output 2005 ml  Net -190 ml   Filed Weights   07/06/22 0500 07/07/22 0500 07/08/22 0414  Weight: 45.1 kg 43.1 kg 42.3 kg   Physical Exam: General: Critically ill and elderly-appearing, working hard to breath on 50% ATC, placed back on vent  HENT: [place], AT, Trach in place, Clean dry and intact Eyes: EOMI, no scleral icterus Respiratory: Diminished but clear to auscultation bilaterally. Shallow rapid respirations  No crackles, wheezing or rales, thin copious secretions  Cardiovascular: RRR, -M/R/G, no JVD GI: BS+, soft,  nontender, ND, Body mass index is 18.21 kg/m.  Extremities:-Edema,-tenderness Neuro: Drowsy but easily awakened and follows commands, CNII-XII grossly intact, moves extremities x 4, very anxious at baseline  K 3.6, Na 146 BUN/Cr 64/0.96 Respiratory culture 4/10 normal flora Blood Cx 4/11 no growth 4 days T Max 99 last 24 hours  Resolved Hospital Problem list    H Flu bacteremia Septic shock  due to H Flu bacteremia, H Flu PNA   Assessment & Plan:    Acute encephalopathy superimposed on chronic dementia - improving Hx anxiety Possible opiate/bzd withdrawal  -medication related, ICU delirium -ammonia wnl P -cont weaning CNS depressing meds: klonpin 1 mg BID - Will change oxy to prn -delirium precautions -PT/OT  AoC hypoxic and hypercarbic resp failure S/p tracheostomy COPD Hx tobacco use -recent Hflu PNA  P -Wean vent -- PSV as tolerated w goal ATC  -VAP, pulm hygiene with triple nebs -Ceftriaxone for total 5-7 antibiotic days -think she will benefit from LTAC   Fever 4/11 -concern for sepsis -- Trach asp from 4/10 in [facility] in pairs (4/6 was [facility] deemed normal flora so not sure that this is actually different)  Has had a PICC for >10 days that apparently cannot be removed as there are no suitable PIV sites per IV team which is unfortunate. Has a foley, but short term  P -De-escalate abx, follow cx data  - Trend WBC and fever curve -PICC line remains in place, no alternative PIV sites -consider alt fever sources but doesn't have obvious drug fever source, has been on vte ppx   Hx HTN Demand ischemia  P -defer adding antihypertensives for now, some variability in pressures   AKI  Hypernatremia - ongoing Contraction alkalosis  P -follow renal indices, UOP -Increase FWF>> now on 200 cc's Q 4 -Gentle diuresis when able  Inadequate PO intake  P -EN via cortrak -defer PEG eval for now, think we should decr sedating meds first in case her mentation improves enough for PO   Urinary retention  -foley replaced 4/9 and 3/13 P -Continue foley  Constipation P -incr bowel reg 4/11  L/T/D -Janina Mayo  (4/8)- maintain -LUE PICC (3/29) - cont trying for PIV placement so we can dc this  -foley (4/9) - Will try Bethanechol 10 mg BID x 3 days to see if she can be liberated from Foley Cath   Best Practice (right click and "Reselect all SmartList Selections" daily)    Diet/type: tubefeeds  DVT prophylaxis: LMWH GI prophylaxis: PPI Lines: Central line -- LUE PICC  Foley:  Yes, and it is still needed Code Status:  DNR   The patient is critically ill with multiple organ systems failure and requires high complexity decision making for assessment and support, frequent evaluation and titration of therapies, application of advanced monitoring technologies and extensive interpretation of multiple databases.  Independent Critical Care Time: 33 Minutes.   Bevelyn Ngo, MSN, AGACNP-BC [facility] See Amion for personal pager PCCM on call pager [phone]  07/08/2022 7:52 AM   Please see Amion for pager number to reach on-call Pulmonary and Critical Care Team.

## 2022-07-08 NOTE — Progress Notes (Addendum)
Physical Therapy Treatment Patient Details Name:  C  MRN:  DOB:  Today's Date: 07/08/2022   History of Present Illness 77 yo female admitted 3/20 unresponsive from home with acute respiratory failure with hypoxia and hypercapnia and metabolic encephalopathy. Intubated 3/20. trach 4/8. PMhx: emphysema, tobacco abuse, PVD, HTN, prediabetes, anxiety, dementia    PT Comments    Rehab efforts focused on seated balance today, progressing to brief periods of unsupported sitting with BIL hand support on bed. Pt reaching forward alternating hands with tactile cues to facilitate forward weight shift and leaning. Follows VC inconsistently but able to volitionally move feet, hands, and lean forward when cued on EOB. Progressed with sit to stand transfer max assist +2 with initial knee block, stood briefly showing ability to tolerate without knee block on Left. Duration limited by loosening of vent on trach; re-secured after sitting down. VSS throughout duration. Patient will continue to benefit from skilled physical therapy services to further improve independence with functional mobility.   Recommendations for follow up therapy are one component of a multi-disciplinary discharge planning process, led by the attending physician.  Recommendations may be updated based on patient status, additional functional criteria and insurance authorization.     Assistance Recommended at Discharge Frequent or constant Supervision/Assistance  Patient can return home with the following Two people to help with walking and/or transfers;Two people to help with bathing/dressing/bathroom;Direct supervision/assist for medications management;Assistance with cooking/housework;Assist for transportation   Equipment Recommendations  Hospital bed;Other (comment) (hoyer)       Precautions / Restrictions Precautions Precautions: Other (comment);Fall Precaution Comments: vent, trach,  cortrak Restrictions Weight Bearing Restrictions: No     Mobility  Bed Mobility Overal bed mobility: Needs Assistance Bed Mobility: Supine to Sit, Sit to Supine     Supine to sit: HOB elevated, Total assist, +2 for safety/equipment Sit to supine: Total assist, +2 for safety/equipment   General bed mobility comments: Total assist with HOB elevated to pivot to EOB. Pt with tendency for posterior lean with max assist for sitting balance initially. Able to progress to intermittent min assist sitting EOB with some brief periods without support, responding to cues for forward lean. Total +2 to return to supine. Following  commands intermittently. Needs tactile cues to facilitate.    Transfers Overall transfer level: Needs assistance Equipment used: 2 person hand held assist Transfers: Sit to/from Stand Sit to Stand: Max assist, +2 physical assistance           General transfer comment: Bil knee block and +2  hand held assist for boost to stand, able to briefly WB without bil knee block. Duration limited by vent tube loosening from trach; sat back down to resecure.    Ambulation/Gait                   Stairs             Wheelchair Mobility    Modified Rankin (Stroke Patients Only)       Balance Overall balance assessment: Needs assistance Sitting-balance support: Feet supported, Bilateral upper extremity supported Sitting balance-Leahy Scale: Poor Sitting balance - Comments: Sat EOB with hands braced, brief duration unsupported by therapist but majority of time needing min assist. (Initially max)   Standing balance support: Bilateral upper extremity supported Standing balance-Leahy Scale: Zero Standing balance comment: Stood for several seconds EOB with +2 assist.                              Cognition Arousal/Alertness: Lethargic Behavior During Therapy: Flat affect Overall Cognitive Status: Difficult to assess                                  General Comments: pt lethargic throughout sesssion. Responding partially with simple commands (move your foot, lift your hand). Improved alertness after transition to EOB, opens eyes, pulls trunk forward when cued.        Exercises      General Comments General comments (skin integrity, edema, etc.): VSS throughout session. Trach vent, PEEP 5, 40% O2. SpO2 100%, deep suction conducted by RT prior to mobilization.      Pertinent Vitals/Pain Pain Assessment Pain Assessment: CPOT Facial Expression: Relaxed, neutral Body Movements: Absence of movements Muscle Tension: Relaxed Compliance with ventilator (intubated pts.): Tolerating ventilator or movement Vocalization (extubated pts.): N/A CPOT Total: 0 Pain Intervention(s): Monitored during session, Repositioned    Home Living                          Prior Function            PT Goals (current goals can now be found in the care plan section) Acute Rehab PT Goals Patient Stated Goal: return to moving and walking PT Goal Formulation: With family Time For Goal Achievement: 07/16/22 Potential to Achieve Goals: Poor Progress towards PT goals: Progressing toward goals    Frequency    Min 1X/week      PT Plan Current plan remains appropriate    Co-evaluation              AM-PAC PT "6 Clicks" Mobility   Outcome Measure  Help needed turning from your back to your side while in a flat bed without using bedrails?: Total Help needed moving from lying on your back to sitting on the side of a flat bed without using bedrails?: Total Help needed moving to and from a bed to a chair (including a wheelchair)?: Total Help needed standing up from a chair using your arms (e.g., wheelchair or bedside chair)?: Total Help needed to walk in hospital room?: Total Help needed climbing 3-5 steps with a railing? : Total 6 Click Score: 6    End of Session Equipment Utilized During Treatment: Gait belt;Other  (comment) (Trach vent) Activity Tolerance: Patient limited by lethargy Patient left: in bed;with call bell/phone within reach;with bed alarm set;with SCD's reapplied Nurse Communication: Mobility status PT Visit Diagnosis: Other abnormalities of gait and mobility (R26.89);Muscle weakness (generalized) (M62.81);Difficulty in walking, not elsewhere classified (R26.2)     Time: 1453-1512 PT Time Calculation (min) (ACUTE ONLY): 19 min  Charges:  $Therapeutic Activity: 8-22 mins                     Reka Wist, PT, DPT Physical Therapist Acute Rehabilitation Services  [facility]    Maleta Pacha S Berlene Dixson 07/08/2022, 4:24 PM   

## 2022-07-08 NOTE — TOC Progression Note (Addendum)
Transition of Care [facility]) - Initial/Assessment Note      Patient Details  Name: Carla [name] MRN: [id number] Date of Birth: 07/15/1945   [facility]) CM/SW Contact:    Ralene Bathe, LCSWA Phone Number: 07/08/2022, 1:57 PM   Clinical Narrative:                 LCSW received consult for LTACH.  The family's facility of choice is [facility].  Victorino Dike with [facility] notified and will request insurance authorization.   TOC following.       Patient Goals and CMS Choice        Expected Discharge Plan and Services                         Prior Living Arrangements/Services          Activities of Daily Living   Permission Sought/Granted                                                           Emotional Assessment   Admission diagnosis:  Hyperglycemia [R73.9] Acute respiratory failure with hypercapnia [J96.02] Acute respiratory failure with hypoxia and hypercapnia [J96.01, J96.02] Glasgow coma scale total score 3-8, in the field (EMT or ambulance) [R40.2431]     Patient Active Problem List    Diagnosis Date Noted   Fever 07/05/2022   Acute respiratory failure with hypoxia 07/05/2022   AKI (acute kidney injury) 07/04/2022   Hypernatremia 07/04/2022   Encephalopathy acute 07/03/2022   Tracheostomy in place 07/03/2022   Urinary retention 07/02/2022   Hypervolemia 07/02/2022   Protein-calorie malnutrition, severe 06/22/2022   Acute respiratory failure with hypercapnia 06/12/2022   COPD with exacerbation 06/12/2022   HTN (hypertension) 06/12/2022   Tobacco abuse 06/12/2022   Anxiety 06/12/2022   Peripheral vascular disease 06/12/2022   Hyperglycemia 06/12/2022   Pulmonary nodules 06/12/2022   Dementia 06/12/2022   Acute metabolic encephalopathy 06/12/2022    PCP:  Patient, [name] Pharmacy:   [facility] - [place], [place] - [address] [place] [address] Phone: [phone] Fax: [phone]         Social  Determinants of Health (SDOH) Social History: SDOH Interventions:     Readmission Risk Interventions

## 2022-07-08 NOTE — Progress Notes (Addendum)
SLP Cancellation Note  Patient Details Name: Carla [name] MRN: [id number] DOB: 06/09/1945   Cancelled evaluation: Pt was on TC this am, back on vent due to increased RR and accessory muscle use. D/W RN. Not appropriate for inline vent valve. Will follow along.  Orlen Leedy L. Malorie Bigford, MA CCC/SLP Clinical Specialist - Acute Care SLP Acute Rehabilitation Services Office number [phone]           [name] 07/08/2022, 9:39 AM 

## 2022-07-08 NOTE — TOC Progression Note (Incomplete Revision)
Transition of Care [facility]) - Initial/Assessment Note      Patient Details  Name: Carla [name] MRN: [id number] Date of Birth: 07/15/1945   Transition of Care [facility]) CM/SW Contact:    Ralene Bathe, LCSWA Phone Number: 07/08/2022, 1:57 PM   Clinical Narrative:                 LCSW received consult for LTACH.  The family's facility of choice is [facility].  Victorino Dike with [facility] notified and will request insurance authorization.   TOC following.       Patient Goals and CMS Choice        Expected Discharge Plan and Services                         Prior Living Arrangements/Services          Activities of Daily Living   Permission Sought/Granted                                                           Emotional Assessment   Admission diagnosis:  Hyperglycemia [R73.9] Acute respiratory failure with hypercapnia [J96.02] Acute respiratory failure with hypoxia and hypercapnia [J96.01, J96.02] Glasgow coma scale total score 3-8, in the field (EMT or ambulance) [R40.2431]     Patient Active Problem List    Diagnosis Date Noted   Fever 07/05/2022   Acute respiratory failure with hypoxia 07/05/2022   AKI (acute kidney injury) 07/04/2022   Hypernatremia 07/04/2022   Encephalopathy acute 07/03/2022   Tracheostomy in place 07/03/2022   Urinary retention 07/02/2022   Hypervolemia 07/02/2022   Protein-calorie malnutrition, severe 06/22/2022   Acute respiratory failure with hypercapnia 06/12/2022   COPD with exacerbation 06/12/2022   HTN (hypertension) 06/12/2022   Tobacco abuse 06/12/2022   Anxiety 06/12/2022   Peripheral vascular disease 06/12/2022   Hyperglycemia 06/12/2022   Pulmonary nodules 06/12/2022   Dementia 06/12/2022   Acute metabolic encephalopathy 06/12/2022    PCP:  Patient, [name] Pharmacy:   [facility] - [place], [place] - [address] [place] [address] Phone: [phone] Fax: [phone]         Social  Determinants of Health (SDOH) Social History: SDOH Interventions:     Readmission Risk Interventions

## 2022-07-09 ENCOUNTER — Inpatient Hospital Stay (HOSPITAL_COMMUNITY): Payer: HMO

## 2022-07-09 DIAGNOSIS — Z4659 Encounter for fitting and adjustment of other gastrointestinal appliance and device: Secondary | ICD-10-CM | POA: Diagnosis not present

## 2022-07-09 DIAGNOSIS — J9602 Acute respiratory failure with hypercapnia: Secondary | ICD-10-CM | POA: Diagnosis not present

## 2022-07-09 LAB — BASIC METABOLIC PANEL WITH GFR
Anion gap: 8 (ref 5–15)
BUN: 56 mg/dL — ABNORMAL HIGH (ref 8–23)
CO2: 29 mmol/L (ref 22–32)
Calcium: 9.9 mg/dL (ref 8.9–10.3)
Chloride: 107 mmol/L (ref 98–111)
Creatinine, Ser: 0.77 mg/dL (ref 0.44–1.00)
GFR, Estimated: >60 mL/min
Glucose, Bld: 141 mg/dL — ABNORMAL HIGH (ref 70–99)
Potassium: 3.9 mmol/L (ref 3.5–5.1)
Sodium: 144 mmol/L (ref 135–145)

## 2022-07-09 LAB — CBC
HCT: 36.6 % (ref 36.0–46.0)
Hemoglobin: 11.6 g/dL — ABNORMAL LOW (ref 12.0–15.0)
MCHC: 31.7 g/dL (ref 30.0–36.0)
RBC: 3.78 MIL/uL — ABNORMAL LOW (ref 3.87–5.11)
RDW: 15.2 % (ref 11.5–15.5)
nRBC: 0 % (ref 0.0–0.2)

## 2022-07-09 LAB — GLUCOSE, CAPILLARY
Glucose-Capillary: 183 mg/dL — ABNORMAL HIGH (ref 70–99)
Glucose-Capillary: 79 mg/dL (ref 70–99)

## 2022-07-09 LAB — CULTURE, BLOOD (ROUTINE X 2)
Culture: NO GROWTH
Culture: NO GROWTH

## 2022-07-09 LAB — MAGNESIUM: Magnesium: 2.7 mg/dL — ABNORMAL HIGH (ref 1.7–2.4)

## 2022-07-09 MED ORDER — BLISTEX MEDICATED EX OINT
TOPICAL_OINTMENT | CUTANEOUS | Status: DC | PRN
  Filled 2022-07-09: qty 6.3

## 2022-07-09 MED ORDER — PANCRELIPASE (LIP-PROT-AMYL) 10440-39150 UNITS PO TABS
20880.0000 [IU] | ORAL_TABLET | Freq: Once | ORAL | Status: AC
  Administered 2022-07-09: 20880 [IU]
  Filled 2022-07-09 (×3): qty 2

## 2022-07-09 MED ORDER — SODIUM BICARBONATE 650 MG PO TABS
650.0000 mg | ORAL_TABLET | Freq: Once | ORAL | Status: AC
  Administered 2022-07-09: 650 mg

## 2022-07-09 MED ORDER — INSULIN ASPART 100 UNIT/ML IJ SOLN
4.0000 [IU] | INTRAMUSCULAR | Status: DC
  Administered 2022-07-10: 4 [IU] via SUBCUTANEOUS

## 2022-07-09 MED ORDER — SODIUM BICARBONATE 650 MG PO TABS
650.0000 mg | ORAL_TABLET | Freq: Once | ORAL | Status: AC
  Administered 2022-07-09: 650 mg
  Filled 2022-07-09 (×2): qty 1

## 2022-07-09 MED ORDER — PANCRELIPASE (LIP-PROT-AMYL) 10440-39150 UNITS PO TABS
20880.0000 [IU] | ORAL_TABLET | Freq: Once | ORAL | Status: AC
  Administered 2022-07-09: 20880 [IU]
  Filled 2022-07-09: qty 2

## 2022-07-09 NOTE — Progress Notes (Addendum)
NAME:  Carla [name], MRN:  [id number], DOB:  03-20-1946, LOS: 27 ADMISSION DATE:  06/12/2022, CONSULTATION DATE:  06/13/2022 REFERRING MD:  Dr. Sherryll Burger, [facility], CHIEF COMPLAINT:  short of breath   History of Present Illness:   77 yo female smoker with hx of COPD from emphysema found by family unresponsive and brought to ER.  Found to have acute on chronic hypercapnia on ABG.  Intubated in ER.  She was started on therapy for COPD exacerbation.  PCCM consulted to assist with respiratory management.  Pertinent  Medical History   Hypertension, Dementia, Anxiety, Depression, GERD, Primary hyperparathyroidism, Vit D deficiency, HLD, Osteoporosis  Significant Hospital Events: Including procedures, antibiotic start and stop dates in addition to other pertinent events    3/20 Admitted after being found unresponsive, intubated on ED arrival  CT angio chest >> atherosclerosis, severe coronary calcification, severe LVH, mild centrilobular emphysema, 2 mm nodule LUL, 4 mm nodule LUL Blood culture  >> Haemophilus influenzae 3/22 Had oxygen desaturation and low Vt with SBT this morning. Echo 06/14/22 >> EF 60 to 65%, mod LVH, grade 1 DD, mild/mod AR 3/25 bronch for peak pressures, low tidal volumes with mucus cast at end of ETT, thick secretions caking tube 3/29 Bronched again with purulent secretions LLL with plugging and LLL atelectasis, some increase in O2 requirement on vent, zosyn started for VAP coverage. MRSA nare swab today negative.  3/30 Remains intubated with waxing and weaning agitation on fentanyl and precedex  3/31 Remains on vent with continued issues with agitation when sedation lightened  4/2 family meeting, made DNR, plans for one way extubation 4/5 4/5-no overnight events, plan is for one-way extubation today 4/6-family changes mind regarding one-way extubation, plan will be for tracheostomy 4/8 trach placed 4/10 failed overnight weaning trial, back on PRVC and sedation  was added.  Subsequently sedation weaned and enteral agents were dc and or decreased.  4/11 failed psv w some distress. Fever. Abx added 4/12 cont efforts at psv. Most awake she has been in days   4/13 Tolerating PS.  4/15 Has been on ATC x 1:45 minutes, RR 40 and accessory muscle use, placing back on vent  Interim History / Subjective:   Tolerating a few hours of trach collar.  No acute events overnight.  Objective   Blood pressure 122/62, pulse 97, temperature 97.7 F (36.5 C), temperature source Axillary, resp. rate (!) 24, height 5' (1.524 m), weight 43.9 kg, SpO2 100 %.    Vent Mode: PSV;CPAP FiO2 (%):  [40 %] 40 % Set Rate:  [12 bmp] 12 bmp Vt Set:  [500 mL] 500 mL PEEP:  [5 cmH20] 5 cmH20 Pressure Support:  [5 cmH20] 5 cmH20 Plateau Pressure:  [15 cmH20-18 cmH20] 18 cmH20   Intake/Output Summary (Last 24 hours) at 07/09/2022 0849 Last data filed at 07/09/2022 0800 Gross per 24 hour  Intake 3370 ml  Output 2085 ml  Net 1285 ml   Filed Weights   07/07/22 0500 07/08/22 0414 07/09/22 0500  Weight: 43.1 kg 42.3 kg 43.9 kg   Physical Exam: Blood pressure 122/62, pulse 97, temperature 97.7 F (36.5 C), temperature source Axillary, resp. rate (!) 24, height 5' (1.524 m), weight 43.9 kg, SpO2 100 %. Gen:      No acute distress, chronically ill-appearing HEENT:  EOMI, sclera anicteric Neck:     No masses; no thyromegaly, trach tube Lungs:    Clear to auscultation bilaterally; normal respiratory effort CV:  Regular rate and rhythm; no murmurs Abd:      + bowel sounds; soft, non-tender; no palpable masses, no distension Ext:    No edema; adequate peripheral perfusion Skin:      Warm and dry; no rash Neuro: Anxious appearing.    Labs/Imaging personally reviewed Significant for BUN/creatinine 56/0.77 Hemoglobin 11.6, platelets 409  Resolved Hospital Problem list    H Flu bacteremia Septic shock due to H Flu bacteremia, H Flu PNA   Assessment & Plan:  Acute encephalopathy  superimposed on chronic dementia - improving Hx anxiety Possible opiate/bzd withdrawal  -medication related, ICU delirium -ammonia wnl P -cont weaning CNS depressing meds: klonpin 1 mg BID -Oxycodone changed to as needed -delirium precautions -PT/OT  AoC hypoxic and hypercarbic resp failure S/p tracheostomy COPD Hx tobacco use -recent Hflu PNA  P -Needing vent.  Trach collar trials as tolerated -VAP, pulm hygiene with triple nebs -Finished a course of ceftriaxone   Hx HTN Demand ischemia  P -defer adding antihypertensives for now, some variability in pressures   AKI  Hypernatremia - ongoing Contraction alkalosis  P -follow renal indices, UOP -Increase FWF>> now on 200 cc's Q 4 -Gentle diuresis when able  Inadequate PO intake  P -EN via cortrak -defer PEG eval for now, think we should decr sedating meds first in case her mentation improves enough for PO   Urinary retention  -foley replaced 4/9 and 3/13 P -Continue foley  Constipation P -incr bowel reg 4/11  Diabetes Increase feeding coverage with NovoLog 4 units every 4 hours as sugars are elevated SSI  L/T/D -Janina Mayo  (4/8)- maintain -LUE PICC (3/29) - cont trying for PIV placement so we can dc this  -foley (4/9) - Will try Bethanechol 10 mg BID x 3 days to see if she can be liberated from Foley Cath   Best Practice (right click and "Reselect all SmartList Selections" daily)   Diet/type: tubefeeds  DVT prophylaxis: LMWH GI prophylaxis: PPI Lines: Central line -- LUE PICC  Foley:  Yes, and it is still needed Code Status:  DNR  The patient is critically ill with multiple organ system failure and requires high complexity decision making for assessment and support, frequent evaluation and titration of therapies, advanced monitoring, review of radiographic studies and interpretation of complex data.   Critical Care Time devoted to patient care services, exclusive of separately billable procedures,  described in this note is  35 minutes.   Chilton Greathouse MD [facility] See Amion for pager  If no response to pager , please call [phone] until 7pm After 7:00 pm call Elink  [phone] 07/09/2022, 9:24 AM

## 2022-07-10 ENCOUNTER — Inpatient Hospital Stay (HOSPITAL_COMMUNITY): Payer: HMO

## 2022-07-10 DIAGNOSIS — Z4659 Encounter for fitting and adjustment of other gastrointestinal appliance and device: Secondary | ICD-10-CM | POA: Diagnosis not present

## 2022-07-10 DIAGNOSIS — J9602 Acute respiratory failure with hypercapnia: Secondary | ICD-10-CM | POA: Diagnosis not present

## 2022-07-10 LAB — GLUCOSE, CAPILLARY
Glucose-Capillary: 81 mg/dL (ref 70–99)
Glucose-Capillary: 103 mg/dL — ABNORMAL HIGH (ref 70–99)
Glucose-Capillary: 110 mg/dL — ABNORMAL HIGH (ref 70–99)
Glucose-Capillary: 47 mg/dL — ABNORMAL LOW (ref 70–99)

## 2022-07-10 MED ORDER — DEXTROSE 50 % IV SOLN
INTRAVENOUS | Status: AC
  Administered 2022-07-10: 50 mL
  Filled 2022-07-10: qty 50

## 2022-07-10 MED ORDER — DEXTROSE 50 % IV SOLN: 25.0000 g | INTRAVENOUS | Status: DC

## 2022-07-10 MED ORDER — DEXMEDETOMIDINE HCL IN NACL 400 MCG/100ML IV SOLN
0.0000 ug/kg/h | INTRAVENOUS | Status: DC
  Administered 2022-07-10: 0.4 ug/kg/h via INTRAVENOUS
  Filled 2022-07-10: qty 100

## 2022-07-10 NOTE — Progress Notes (Incomplete Revision)
 NAME:  Carla [name], MRN:  [id number], DOB:  01/20/1946, LOS: 28 ADMISSION DATE:  06/12/2022, CONSULTATION DATE:  06/13/2022 REFERRING MD:  Dr. Shah, [facility], CHIEF COMPLAINT:  short of breath   History of Present Illness:   77 yo female smoker with hx of COPD from emphysema found by family unresponsive and brought to ER.  Found to have acute on chronic hypercapnia on ABG.  Intubated in ER.  She was started on therapy for COPD exacerbation.  PCCM consulted to assist with respiratory management.  Pertinent  Medical History   Hypertension, Dementia, Anxiety, Depression, GERD, Primary hyperparathyroidism, Vit D deficiency, HLD, Osteoporosis  Significant Hospital Events: Including procedures, antibiotic start and stop dates in addition to other pertinent events    3/20 Admitted after being found unresponsive, intubated on ED arrival  CT angio chest >> atherosclerosis, severe coronary calcification, severe LVH, mild centrilobular emphysema, 2 mm nodule LUL, 4 mm nodule LUL Blood culture  >> Haemophilus influenzae 3/22 Had oxygen desaturation and low Vt with SBT this morning. Echo 06/14/22 >> EF 60 to 65%, mod LVH, grade 1 DD, mild/mod AR 3/25 bronch for peak pressures, low tidal volumes with mucus cast at end of ETT, thick secretions caking tube 3/29 Bronched again with purulent secretions LLL with plugging and LLL atelectasis, some increase in O2 requirement on vent, zosyn started for VAP coverage. MRSA nare swab today negative.  3/30 Remains intubated with waxing and weaning agitation on fentanyl and precedex  3/31 Remains on vent with continued issues with agitation when sedation lightened  4/2 family meeting, made DNR, plans for one way extubation 4/5 4/5-no overnight events, plan is for one-way extubation today 4/6-family changes mind regarding one-way extubation, plan will be for tracheostomy 4/8 trach placed 4/10 failed overnight weaning trial, back on PRVC and sedation  was added.  Subsequently sedation weaned and enteral agents were dc and or decreased.  4/11 failed psv w some distress. Fever. Abx added 4/12 cont efforts at psv. Most awake she has been in days   4/13 Tolerating PS.  4/15 Has been on ATC x 1:45 minutes, RR 40 and accessory muscle use, placing back on vent  Interim History / Subjective:   Cortrack clogged and she is unable to get her klonopin. Agitated delirium overnight so started on precedex  Objective   Blood pressure 103/62, pulse 76, temperature 98.4 F (36.9 C), temperature source Oral, resp. rate (!) 25, height 5' (1.524 m), weight 39.3 kg, SpO2 95 %.    FiO2 (%):  [28 %-50 %] 28 %   Intake/Output Summary (Last 24 hours) at 07/10/2022 0910 Last data filed at 07/10/2022 0800 Gross per 24 hour  Intake 11.04 ml  Output 2118 ml  Net -2106.96 ml   Filed Weights   07/08/22 0414 07/09/22 0500 07/10/22 0500  Weight: 42.3 kg 43.9 kg 39.3 kg   Physical Exam: Gen:      No acute distress, chronically ill apprearing HEENT:  EOMI, sclera anicteric Neck:     No masses; no thyromegaly, trach Lungs:    Clear to auscultation bilaterally; normal respiratory effort CV:         Regular rate and rhythm; no murmurs Abd:      + bowel sounds; soft, non-tender; no palpable masses, no distension Ext:    No edema; adequate peripheral perfusion Skin:      Warm and dry; no rash Neuro: Sedated  Labs/Imaging personally reviewed Significant for BUN/creatinine 56/0.7 WBC 8.6, hemoglobin 11.6, platelets 409   No new imaging  Resolved Hospital Problem list    H Flu bacteremia Septic shock due to H Flu bacteremia, H Flu PNA   Assessment & Plan:  Acute encephalopathy superimposed on chronic dementia - improving Hx anxiety Possible opiate/bzd withdrawal  -medication related, ICU delirium -ammonia wnl P -cont weaning CNS depressing meds: klonpin 1 mg BID when enteral access is obtained -Oxycodone changed to as needed -delirium precautions -PT/OT  AoC  hypoxic and hypercarbic resp failure S/p tracheostomy COPD Hx tobacco use -recent Hflu PNA  P -Needing vent.  Trach collar trials as tolerated -VAP, pulm hygiene with triple nebs -Finished a course of ceftriaxone  Hx HTN Demand ischemia  P -defer adding antihypertensives for now, some variability in pressures   AKI  Hypernatremia - ongoing Contraction alkalosis  P -follow renal indices, UOP -Increase FWF>> now on 200 cc's Q 4 -Gentle diuresis when able  Inadequate PO intake  P -EN via cortrak. Will need replacement as current cortrak is clogged -defer PEG eval for now, think we should decr sedating meds first in case her mentation improves enough for PO   Urinary retention  -foley replaced 4/9 and 3/13 P -Continue foley  Constipation P -incr bowel reg 4/11  Diabetes Continue NovoLog 4 units every 4 hours as sugars are elevated SSI  L/T/D -Trach  (4/8)- maintain -LUE PICC (3/29) - cont trying for PIV placement so we can dc this  -foley (4/9)   Best Practice (right click and "Reselect all SmartList Selections" daily)   Diet/type: tubefeeds  DVT prophylaxis: LMWH GI prophylaxis: PPI Lines: Central line -- LUE PICC  Foley:  Yes, and it is still needed Code Status:  DNR  The patient is critically ill with multiple organ system failure and requires high complexity decision making for assessment and support, frequent evaluation and titration of therapies, advanced monitoring, review of radiographic studies and interpretation of complex data.   Critical Care Time devoted to patient care services, exclusive of separately billable procedures, described in this note is  35 minutes.   Nyia Tsao MD [facility] See Amion for pager  If no response to pager , please call [phone] until 7pm After 7:00 pm call Elink  [phone] 07/10/2022, 9:10 AM  

## 2022-07-10 NOTE — Progress Notes (Addendum)
SLP Cancellation Note  Patient Details Name:  C  MRN:  DOB:    Cancelled treatment:       Reason Eval/Treat Not Completed: Patient's level of consciousness. Patient has been on trach collar today but she is lethargic secondary to needing precedex. SLP will continue to follow for readiness.  Cambre Matson T. Saysha Menta, MA, CCC-SLP Speech Therapy  

## 2022-07-10 NOTE — Progress Notes (Addendum)
eLink Physician-Brief Progress Note Patient Name: Carla [name] DOB: 11-22-1945 MRN: [id number]   Date of Service  07/10/2022  HPI/Events of Note  Patient with agitated delirium.  eICU Interventions  Low dose Precedex gtt ordered.        Thomasene Lot [name] No 07/10/2022, 5:47 AM

## 2022-07-10 NOTE — Progress Notes (Addendum)
 NAME:  Carla [name], MRN:  [id number], DOB:  01/20/1946, LOS: 28 ADMISSION DATE:  06/12/2022, CONSULTATION DATE:  06/13/2022 REFERRING MD:  Dr. Shah, [facility], CHIEF COMPLAINT:  short of breath   History of Present Illness:   77 yo female smoker with hx of COPD from emphysema found by family unresponsive and brought to ER.  Found to have acute on chronic hypercapnia on ABG.  Intubated in ER.  She was started on therapy for COPD exacerbation.  PCCM consulted to assist with respiratory management.  Pertinent  Medical History   Hypertension, Dementia, Anxiety, Depression, GERD, Primary hyperparathyroidism, Vit D deficiency, HLD, Osteoporosis  Significant Hospital Events: Including procedures, antibiotic start and stop dates in addition to other pertinent events    3/20 Admitted after being found unresponsive, intubated on ED arrival  CT angio chest >> atherosclerosis, severe coronary calcification, severe LVH, mild centrilobular emphysema, 2 mm nodule LUL, 4 mm nodule LUL Blood culture  >> Haemophilus influenzae 3/22 Had oxygen desaturation and low Vt with SBT this morning. Echo 06/14/22 >> EF 60 to 65%, mod LVH, grade 1 DD, mild/mod AR 3/25 bronch for peak pressures, low tidal volumes with mucus cast at end of ETT, thick secretions caking tube 3/29 Bronched again with purulent secretions LLL with plugging and LLL atelectasis, some increase in O2 requirement on vent, zosyn started for VAP coverage. MRSA nare swab today negative.  3/30 Remains intubated with waxing and weaning agitation on fentanyl and precedex  3/31 Remains on vent with continued issues with agitation when sedation lightened  4/2 family meeting, made DNR, plans for one way extubation 4/5 4/5-no overnight events, plan is for one-way extubation today 4/6-family changes mind regarding one-way extubation, plan will be for tracheostomy 4/8 trach placed 4/10 failed overnight weaning trial, back on PRVC and sedation  was added.  Subsequently sedation weaned and enteral agents were dc and or decreased.  4/11 failed psv w some distress. Fever. Abx added 4/12 cont efforts at psv. Most awake she has been in days   4/13 Tolerating PS.  4/15 Has been on ATC x 1:45 minutes, RR 40 and accessory muscle use, placing back on vent  Interim History / Subjective:   Cortrack clogged and she is unable to get her klonopin. Agitated delirium overnight so started on precedex  Objective   Blood pressure 103/62, pulse 76, temperature 98.4 F (36.9 C), temperature source Oral, resp. rate (!) 25, height 5' (1.524 m), weight 39.3 kg, SpO2 95 %.    FiO2 (%):  [28 %-50 %] 28 %   Intake/Output Summary (Last 24 hours) at 07/10/2022 0910 Last data filed at 07/10/2022 0800 Gross per 24 hour  Intake 11.04 ml  Output 2118 ml  Net -2106.96 ml   Filed Weights   07/08/22 0414 07/09/22 0500 07/10/22 0500  Weight: 42.3 kg 43.9 kg 39.3 kg   Physical Exam: Gen:      No acute distress, chronically ill apprearing HEENT:  EOMI, sclera anicteric Neck:     No masses; no thyromegaly, trach Lungs:    Clear to auscultation bilaterally; normal respiratory effort CV:         Regular rate and rhythm; no murmurs Abd:      + bowel sounds; soft, non-tender; no palpable masses, no distension Ext:    No edema; adequate peripheral perfusion Skin:      Warm and dry; no rash Neuro: Sedated  Labs/Imaging personally reviewed Significant for BUN/creatinine 56/0.7 WBC 8.6, hemoglobin 11.6, platelets 409   No new imaging  Resolved Hospital Problem list    H Flu bacteremia Septic shock due to H Flu bacteremia, H Flu PNA   Assessment & Plan:  Acute encephalopathy superimposed on chronic dementia - improving Hx anxiety Possible opiate/bzd withdrawal  -medication related, ICU delirium -ammonia wnl P -cont weaning CNS depressing meds: klonpin 1 mg BID when enteral access is obtained -Oxycodone changed to as needed -delirium precautions -PT/OT  AoC  hypoxic and hypercarbic resp failure S/p tracheostomy COPD Hx tobacco use -recent Hflu PNA  P -Needing vent.  Trach collar trials as tolerated -VAP, pulm hygiene with triple nebs -Finished a course of ceftriaxone  Hx HTN Demand ischemia  P -defer adding antihypertensives for now, some variability in pressures   AKI  Hypernatremia - ongoing Contraction alkalosis  P -follow renal indices, UOP -Increase FWF>> now on 200 cc's Q 4 -Gentle diuresis when able  Inadequate PO intake  P -EN via cortrak. Will need replacement as current cortrak is clogged -defer PEG eval for now, think we should decr sedating meds first in case her mentation improves enough for PO   Urinary retention  -foley replaced 4/9 and 3/13 P -Continue foley  Constipation P -incr bowel reg 4/11  Diabetes Continue NovoLog 4 units every 4 hours as sugars are elevated SSI  L/T/D -Trach  (4/8)- maintain -LUE PICC (3/29) - cont trying for PIV placement so we can dc this  -foley (4/9)   Best Practice (right click and "Reselect all SmartList Selections" daily)   Diet/type: tubefeeds  DVT prophylaxis: LMWH GI prophylaxis: PPI Lines: Central line -- LUE PICC  Foley:  Yes, and it is still needed Code Status:  DNR  The patient is critically ill with multiple organ system failure and requires high complexity decision making for assessment and support, frequent evaluation and titration of therapies, advanced monitoring, review of radiographic studies and interpretation of complex data.   Critical Care Time devoted to patient care services, exclusive of separately billable procedures, described in this note is  35 minutes.   Nyia Tsao MD [facility] See Amion for pager  If no response to pager , please call [phone] until 7pm After 7:00 pm call Elink  [phone] 07/10/2022, 9:10 AM  

## 2022-07-11 DIAGNOSIS — J9602 Acute respiratory failure with hypercapnia: Secondary | ICD-10-CM | POA: Diagnosis not present

## 2022-07-11 LAB — GLUCOSE, CAPILLARY
Glucose-Capillary: 116 mg/dL — ABNORMAL HIGH (ref 70–99)
Glucose-Capillary: 119 mg/dL — ABNORMAL HIGH (ref 70–99)
Glucose-Capillary: 139 mg/dL — ABNORMAL HIGH (ref 70–99)
Glucose-Capillary: 147 mg/dL — ABNORMAL HIGH (ref 70–99)
Glucose-Capillary: 76 mg/dL (ref 70–99)
Glucose-Capillary: 85 mg/dL (ref 70–99)
Glucose-Capillary: 93 mg/dL (ref 70–99)

## 2022-07-11 MED ORDER — DEXTROSE 50 % IV SOLN
12.5000 g | INTRAVENOUS | Status: AC
  Administered 2022-07-11: 12.5 g via INTRAVENOUS
  Filled 2022-07-11: qty 50

## 2022-07-11 NOTE — Evaluation (Addendum)
[facility] Speaking Valve - Evaluation Patient Details  Name: Carla [name] MRN: [id number] Date of Birth: Jun 19, 1945  Today's Date: 07/11/2022 Time: 4098-1191 SLP Time Calculation (min) (ACUTE ONLY): 16 min  Past Medical History: No past medical history on file. Past Surgical History: The histories are not reviewed yet. Please review them in the "History" navigator section and refresh this SmartLink. HPI:  Carla [name] is a 77 yo female who was found by family unresponsive and brought to ER 06/12/22.  Found to have acute on chronic hypercapnia on ABG.  Intubated in ER and unable to wean from vent.  Initial plans for one way extubation, but with change in goals. Tracheostomy placed 4/8.  Difficulty weaning from vent, but tolerating trach collar for 48 hours as of 4/18. Pt with with hx COPD from emphysema, tobacco abuse, peripheral vascular disease, hypertension, prediabetes, anxiety, baseline dementia.    Assessment / Plan / Recommendation  Clinical Impression  Pt seen for trial of PMSV in setting of VDRF requring 19 day intubation with tracheostomy placed 4/8.  Pt has been tolerating trach collar for 48 hours.  RN reports increased LOA, but pt is drowsy at time of this assessment.  Pt did not follow directions to attempt to phonate for leak speech.  Pt swatting gently at therapist when assessing tracheostomy.  Cuff deflated on arrival.  Pt did not follow directions for phonation with finger occlusion.  PMV placed for 5 breaths x2 with no breath stacking/backpressure noted.  Pt tolerated valve placement for 10 minutes with relatively stable vital signs (HR and SpO2 unchanged at 92 and 99, RR increased from 25 to 33 but then recovered - there was also increased RR of 34 following removal of valve).  Pt with only minimal attempts to communicate, kept eyes closed throughout majority of session, and appeared very drowsy.  Pt did say "I'm sleepy" at one point while SLP was trying to rouse pt with very  breathy vocal quality.  There was weak sounding spontaneous cough with no expectoration of secretions.  Pt withdrew with presentation of yankauer.  Pt declined PO trials today.  Pt appears medically able to tolerate valve, but level of arousal is a barrier to use.  SLP will continue to follow for increased tolerance of valve and swallowing assessment with pt is more alert.  Places PMSV guidelines in room at head of bed.  Placed purple cuff warning at [facility] and attached sticker to cuff lead.  Recommend pt wear valve intermittently with staff when she is awake/alert.  Please remove PMSV prior to departure.  SLP Visit Diagnosis: Aphonia (R49.1)    SLP Assessment  Patient needs continued Speech Lanaguage Pathology Services    Recommendations for follow up therapy are one component of a multi-disciplinary discharge planning process, led by the attending physician.  Recommendations may be updated based on patient status, additional functional criteria and insurance authorization.  Follow Up Recommendations   (Continue ST at next level of care at this time)    Assistance Recommended at Discharge Frequent or constant Supervision/Assistance  Functional Status Assessment Patient has had a recent decline in their functional status and demonstrates the ability to make significant improvements in function in a reasonable and predictable amount of time.  Frequency and Duration min 2x/week  2 weeks    PMSV Trial PMSV was placed for: 10 mintues Able to redirect subglottic air through upper airway: Yes Able to Attain Phonation: Yes Voice Quality: Hoarse;Breathy Able to Expectorate Secretions: No attempts Level of  Secretion Expectoration with PMSV: Not observed Breath Support for Phonation: Inadequate Intelligibility: Not tested Respirations During Trial: 26 (reached 33 at height) SpO2 During Trial: 99 % Pulse During Trial: 92 Behavior:  (drowsy)   Tracheostomy Tube       Vent Dependency  FiO2 (%):  28 %    Cuff Deflation Trial Tolerated Cuff Deflation: Yes Length of Time for Cuff Deflation Trial: deflated on arrival Behavior: Other (comment) (drowsy)         Kerrie Pleasure, MA, CCC-SLP Acute Rehabilitation Services Office: [phone] 07/11/2022, 12:34 PM

## 2022-07-11 NOTE — Progress Notes (Incomplete Revision)
 NAME:  Carla [name], MRN:  [id number], DOB:  12/10/1945, LOS: 28 ADMISSION DATE:  06/12/2022, CONSULTATION DATE:  06/13/2022 REFERRING MD:  Dr. Shah, [facility], CHIEF COMPLAINT:  short of breath   History of Present Illness:   77 yo female smoker with hx of COPD from emphysema found by family unresponsive and brought to ER.  Found to have acute on chronic hypercapnia on ABG.  Intubated in ER.  She was started on therapy for COPD exacerbation.  PCCM consulted to assist with respiratory management.  Pertinent  Medical History   Hypertension, Dementia, Anxiety, Depression, GERD, Primary hyperparathyroidism, Vit D deficiency, HLD, Osteoporosis  Significant Hospital Events: Including procedures, antibiotic start and stop dates in addition to other pertinent events    3/20 Admitted after being found unresponsive, intubated on ED arrival  CT angio chest >> atherosclerosis, severe coronary calcification, severe LVH, mild centrilobular emphysema, 2 mm nodule LUL, 4 mm nodule LUL Blood culture  >> Haemophilus influenzae 3/22 Had oxygen desaturation and low Vt with SBT this morning. Echo 06/14/22 >> EF 60 to 65%, mod LVH, grade 1 DD, mild/mod AR 3/25 bronch for peak pressures, low tidal volumes with mucus cast at end of ETT, thick secretions caking tube 3/29 Bronched again with purulent secretions LLL with plugging and LLL atelectasis, some increase in O2 requirement on vent, zosyn started for VAP coverage. MRSA nare swab today negative.  3/30 Remains intubated with waxing and weaning agitation on fentanyl and precedex  3/31 Remains on vent with continued issues with agitation when sedation lightened  4/2 family meeting, made DNR, plans for one way extubation 4/5 4/5-no overnight events, plan is for one-way extubation today 4/6-family changes mind regarding one-way extubation, plan will be for tracheostomy 4/8 trach placed 4/10 failed overnight weaning trial, back on PRVC and sedation  was added.  Subsequently sedation weaned and enteral agents were dc and or decreased.  4/11 failed psv w some distress. Fever. Abx added 4/12 cont efforts at psv. Most awake she has been in days   4/13 Tolerating PS.  4/15 Has been on ATC x 1:45 minutes, RR 40 and accessory muscle use, placing back on vent 4/18 Cortrak clogged and thus replaced. Still working on weaning completed CTx for PNA. 48 hrs on ATC   Interim History / Subjective:  Resting   Objective   Blood pressure 103/62, pulse 76, temperature 98.4 F (36.9 C), temperature source Oral, resp. rate (!) 25, height 5' (1.524 m), weight 39.3 kg, SpO2 95 %.    FiO2 (%):  [28 %-50 %] 28 %   Intake/Output Summary (Last 24 hours) at 07/10/2022 0910 Last data filed at 07/10/2022 0800 Gross per 24 hour  Intake 11.04 ml  Output 2118 ml  Net -2106.96 ml   Filed Weights   07/08/22 0414 07/09/22 0500 07/10/22 0500  Weight: 42.3 kg 43.9 kg 39.3 kg   Physical Exam: General this is a 77-year-old female patient who is currently resting on aerosol trach collar she is in no acute distress HEENT normocephalic atraumatic size 6 tracheostomy tube is midline.  This is a cuffed tube the cuff is deflated currently.  She is able to make some phonation Pulmonary: Coarse scattered rhonchi no accessory use appears comfortable on aerosol trach collar 28% Cardiac: Regular rate and rhythm Abdomen: Soft nontender Extremities: Warm dry brisk capillary refill scattered areas of ecchymosis Neuro: Awake, follows commands, still confused, but cooperative.  Resolved Hospital Problem list   H Flu bacteremia Septic shock due   to H Flu bacteremia, H Flu PNA  Contraction alkalosis  Assessment & Plan:  Acute encephalopathy superimposed on chronic dementia & complicated by possible opioid/benzo w/d, ICU delirium and h/o anxiety  Improving.  Plan cont weaning CNS depressing meds: klonpin 1 mg BID on 4/17 (seems like she may need this at dc) Oxycodone changed to as  needed delirium precautions PT/OT  AoC hypoxic and hypercarbic resp failure 2/2 HFlu PNA S/p tracheostomy 4/8 COPD Hx tobacco use -now off x 48 hrs Plan Cont ATC for sats > 88% Vent at standby and can prob be removed from room at 72hrs Needs to start PMV trial and work w/ SLP Cont brovana, yupelri and pulmicort OOB We can change to cuffless trach tomorrow, and I do think with time she has a possible candidate for decannulation  Hx HTN Demand ischemia  Plan defer adding antihypertensives for now, some variability in pressures   AKI  -resolving Plan Am chem if stable remove from active problem list   Hypernatremia - ongoing but now normalizing Plan Cont free water replacement  Am chem   Inadequate PO intake  Plan TF via  cortrak.  defer PEG eval for now, think we should decr sedating meds first in case her mentation improves enough for PO   Urinary retention  -foley replaced 4/9 and 3/13 Plan -Continue foley  Constipation -incr bowel reg 4/11 Plan Cont as ordered  Diabetes Plan Cont current ssi    Best Practice (right click and "Reselect all SmartList Selections" daily)   Diet/type: tubefeeds  DVT prophylaxis: LMWH GI prophylaxis: PPI Lines: Central line -- LUE PICC  Foley:  Yes, and it is still needed Code Status:  DNR  My time 32 min   07/10/2022, 9:10 AM   -------------------------------------------------------------------------  Attending note: I have seen and examined the patient. History, labs and imaging reviewed.    77-year-old with COPD exacerbation, prolonged vent dependence s/p tracheostomy.  On weaning trials.  Has been able to tolerate trach collar for 2 days  Blood pressure (!) 178/89, pulse 94, temperature (!) 97.5 F (36.4 C), temperature source Axillary, resp. rate (!) 24, height 5' (1.524 m), weight 40.1 kg, SpO2 98 %. Gen:      No acute distress, chronically ill-appearing HEENT:  EOMI, sclera anicteric Neck:     No masses;  no thyromegaly, tracheostomy Lungs:    Clear to auscultation bilaterally; normal respiratory effort CV:         Regular rate and rhythm; no murmurs Abd:      + bowel sounds; soft, non-tender; no palpable masses, no distension Ext:    No edema; adequate peripheral perfusion Skin:      Warm and dry; no rash Neuro: Awake, confused  Labs/Imaging personally reviewed, significant for Creatinine 58/0.77, hemoglobin 11.6, platelets 409 No new imaging  Assessment/plan: Acute on chronic hypoxic, hypercarbic respiratory failure Prolonged vent dependent s/p tracheostomy Continue weaning trials, trach collar as tolerated Can probably go to cuffless tomorrow On ceftriaxone for 7 days Routine bronchial hygiene, intermittent chest x-ray   Peer to peer denied for LTAC placement.   The patient is critically ill with multiple organ systems failure and requires high complexity decision making for assessment and support, frequent evaluation and titration of therapies, application of advanced monitoring technologies and extensive interpretation of multiple databases.  Critical care time - 35 mins. This represents my time independent of the NPs time taking care of the pt.  Eddi Hymes MD [facility] 07/11/2022,   11:53 AM  

## 2022-07-11 NOTE — TOC Progression Note (Addendum)
Transition of Care [facility]) - Initial/Assessment Note      Patient Details  Name: Carla [name] MRN: [id number] Date of Birth: 1945/11/20   Transition of Care [facility]) CM/SW Contact:    Ralene Bathe, LCSWA Phone Number: 07/11/2022, 12:50 PM   Clinical Narrative:                 Insurance denied LTACH after peer to peer. Per NP, the plan will be to work towards the patient being accepted for Inpatient rehab once medically appropriate.   TOC following.       Patient Goals and CMS Choice        Expected Discharge Plan and Services                         Prior Living Arrangements/Services          Activities of Daily Living   Permission Sought/Granted                                                           Emotional Assessment   Admission diagnosis:  Hyperglycemia [R73.9] Acute respiratory failure with hypercapnia [J96.02] Acute respiratory failure with hypoxia and hypercapnia [J96.01, J96.02] Glasgow coma scale total score 3-8, in the field (EMT or ambulance) [R40.2431]     Patient Active Problem List    Diagnosis Date Noted   Fever 07/05/2022   Acute respiratory failure with hypoxia 07/05/2022   AKI (acute kidney injury) 07/04/2022   Hypernatremia 07/04/2022   Encephalopathy acute 07/03/2022   Tracheostomy in place 07/03/2022   Urinary retention 07/02/2022   Hypervolemia 07/02/2022   Protein-calorie malnutrition, severe 06/22/2022   Acute respiratory failure with hypercapnia 06/12/2022   COPD with exacerbation 06/12/2022   HTN (hypertension) 06/12/2022   Tobacco abuse 06/12/2022   Anxiety 06/12/2022   Peripheral vascular disease 06/12/2022   Hyperglycemia 06/12/2022   Pulmonary nodules 06/12/2022   Dementia 06/12/2022   Acute metabolic encephalopathy 06/12/2022    PCP:  Patient, [name] Pharmacy:   [facility] - [place], [place] - [address] [place] [address] Phone: [phone] Fax: [phone]         Social  Determinants of Health (SDOH) Social History: SDOH Interventions:     Readmission Risk Interventions

## 2022-07-11 NOTE — Progress Notes (Addendum)
 NAME:  Carla [name], MRN:  [id number], DOB:  12/10/1945, LOS: 28 ADMISSION DATE:  06/12/2022, CONSULTATION DATE:  06/13/2022 REFERRING MD:  Dr. Shah, [facility], CHIEF COMPLAINT:  short of breath   History of Present Illness:   77 yo female smoker with hx of COPD from emphysema found by family unresponsive and brought to ER.  Found to have acute on chronic hypercapnia on ABG.  Intubated in ER.  She was started on therapy for COPD exacerbation.  PCCM consulted to assist with respiratory management.  Pertinent  Medical History   Hypertension, Dementia, Anxiety, Depression, GERD, Primary hyperparathyroidism, Vit D deficiency, HLD, Osteoporosis  Significant Hospital Events: Including procedures, antibiotic start and stop dates in addition to other pertinent events    3/20 Admitted after being found unresponsive, intubated on ED arrival  CT angio chest >> atherosclerosis, severe coronary calcification, severe LVH, mild centrilobular emphysema, 2 mm nodule LUL, 4 mm nodule LUL Blood culture  >> Haemophilus influenzae 3/22 Had oxygen desaturation and low Vt with SBT this morning. Echo 06/14/22 >> EF 60 to 65%, mod LVH, grade 1 DD, mild/mod AR 3/25 bronch for peak pressures, low tidal volumes with mucus cast at end of ETT, thick secretions caking tube 3/29 Bronched again with purulent secretions LLL with plugging and LLL atelectasis, some increase in O2 requirement on vent, zosyn started for VAP coverage. MRSA nare swab today negative.  3/30 Remains intubated with waxing and weaning agitation on fentanyl and precedex  3/31 Remains on vent with continued issues with agitation when sedation lightened  4/2 family meeting, made DNR, plans for one way extubation 4/5 4/5-no overnight events, plan is for one-way extubation today 4/6-family changes mind regarding one-way extubation, plan will be for tracheostomy 4/8 trach placed 4/10 failed overnight weaning trial, back on PRVC and sedation  was added.  Subsequently sedation weaned and enteral agents were dc and or decreased.  4/11 failed psv w some distress. Fever. Abx added 4/12 cont efforts at psv. Most awake she has been in days   4/13 Tolerating PS.  4/15 Has been on ATC x 1:45 minutes, RR 40 and accessory muscle use, placing back on vent 4/18 Cortrak clogged and thus replaced. Still working on weaning completed CTx for PNA. 48 hrs on ATC   Interim History / Subjective:  Resting   Objective   Blood pressure 103/62, pulse 76, temperature 98.4 F (36.9 C), temperature source Oral, resp. rate (!) 25, height 5' (1.524 m), weight 39.3 kg, SpO2 95 %.    FiO2 (%):  [28 %-50 %] 28 %   Intake/Output Summary (Last 24 hours) at 07/10/2022 0910 Last data filed at 07/10/2022 0800 Gross per 24 hour  Intake 11.04 ml  Output 2118 ml  Net -2106.96 ml   Filed Weights   07/08/22 0414 07/09/22 0500 07/10/22 0500  Weight: 42.3 kg 43.9 kg 39.3 kg   Physical Exam: General this is a 77-year-old female patient who is currently resting on aerosol trach collar she is in no acute distress HEENT normocephalic atraumatic size 6 tracheostomy tube is midline.  This is a cuffed tube the cuff is deflated currently.  She is able to make some phonation Pulmonary: Coarse scattered rhonchi no accessory use appears comfortable on aerosol trach collar 28% Cardiac: Regular rate and rhythm Abdomen: Soft nontender Extremities: Warm dry brisk capillary refill scattered areas of ecchymosis Neuro: Awake, follows commands, still confused, but cooperative.  Resolved Hospital Problem list   H Flu bacteremia Septic shock due   to H Flu bacteremia, H Flu PNA  Contraction alkalosis  Assessment & Plan:  Acute encephalopathy superimposed on chronic dementia & complicated by possible opioid/benzo w/d, ICU delirium and h/o anxiety  Improving.  Plan cont weaning CNS depressing meds: klonpin 1 mg BID on 4/17 (seems like she may need this at dc) Oxycodone changed to as  needed delirium precautions PT/OT  AoC hypoxic and hypercarbic resp failure 2/2 HFlu PNA S/p tracheostomy 4/8 COPD Hx tobacco use -now off x 48 hrs Plan Cont ATC for sats > 88% Vent at standby and can prob be removed from room at 72hrs Needs to start PMV trial and work w/ SLP Cont brovana, yupelri and pulmicort OOB We can change to cuffless trach tomorrow, and I do think with time she has a possible candidate for decannulation  Hx HTN Demand ischemia  Plan defer adding antihypertensives for now, some variability in pressures   AKI  -resolving Plan Am chem if stable remove from active problem list   Hypernatremia - ongoing but now normalizing Plan Cont free water replacement  Am chem   Inadequate PO intake  Plan TF via  cortrak.  defer PEG eval for now, think we should decr sedating meds first in case her mentation improves enough for PO   Urinary retention  -foley replaced 4/9 and 3/13 Plan -Continue foley  Constipation -incr bowel reg 4/11 Plan Cont as ordered  Diabetes Plan Cont current ssi    Best Practice (right click and "Reselect all SmartList Selections" daily)   Diet/type: tubefeeds  DVT prophylaxis: LMWH GI prophylaxis: PPI Lines: Central line -- LUE PICC  Foley:  Yes, and it is still needed Code Status:  DNR  My time 32 min   07/10/2022, 9:10 AM   -------------------------------------------------------------------------  Attending note: I have seen and examined the patient. History, labs and imaging reviewed.    77-year-old with COPD exacerbation, prolonged vent dependence s/p tracheostomy.  On weaning trials.  Has been able to tolerate trach collar for 2 days  Blood pressure (!) 178/89, pulse 94, temperature (!) 97.5 F (36.4 C), temperature source Axillary, resp. rate (!) 24, height 5' (1.524 m), weight 40.1 kg, SpO2 98 %. Gen:      No acute distress, chronically ill-appearing HEENT:  EOMI, sclera anicteric Neck:     No masses;  no thyromegaly, tracheostomy Lungs:    Clear to auscultation bilaterally; normal respiratory effort CV:         Regular rate and rhythm; no murmurs Abd:      + bowel sounds; soft, non-tender; no palpable masses, no distension Ext:    No edema; adequate peripheral perfusion Skin:      Warm and dry; no rash Neuro: Awake, confused  Labs/Imaging personally reviewed, significant for Creatinine 58/0.77, hemoglobin 11.6, platelets 409 No new imaging  Assessment/plan: Acute on chronic hypoxic, hypercarbic respiratory failure Prolonged vent dependent s/p tracheostomy Continue weaning trials, trach collar as tolerated Can probably go to cuffless tomorrow On ceftriaxone for 7 days Routine bronchial hygiene, intermittent chest x-ray   Peer to peer denied for LTAC placement.   The patient is critically ill with multiple organ systems failure and requires high complexity decision making for assessment and support, frequent evaluation and titration of therapies, application of advanced monitoring technologies and extensive interpretation of multiple databases.  Critical care time - 35 mins. This represents my time independent of the NPs time taking care of the pt.  Eddi Hymes MD [facility] 07/11/2022,   11:53 AM  

## 2022-07-11 NOTE — TOC Progression Note (Incomplete Revision)
Transition of Care [facility]) - Initial/Assessment Note      Patient Details  Name: [name] MRN: [id number] Date of Birth: 1945/11/20   Transition of Care [facility]) CM/SW Contact:    Ralene Bathe, LCSWA Phone Number: 07/11/2022, 12:50 PM   Clinical Narrative:                 Insurance denied LTACH after peer to peer. Per NP, the plan will be to work towards the patient being accepted for Inpatient rehab once medically appropriate.   TOC following.       Patient Goals and CMS Choice        Expected Discharge Plan and Services                         Prior Living Arrangements/Services          Activities of Daily Living   Permission Sought/Granted                                                           Emotional Assessment   Admission diagnosis:  Hyperglycemia [R73.9] Acute respiratory failure with hypercapnia [J96.02] Acute respiratory failure with hypoxia and hypercapnia [J96.01, J96.02] Glasgow coma scale total score 3-8, in the field (EMT or ambulance) [R40.2431]     Patient Active Problem List    Diagnosis Date Noted   Fever 07/05/2022   Acute respiratory failure with hypoxia 07/05/2022   AKI (acute kidney injury) 07/04/2022   Hypernatremia 07/04/2022   Encephalopathy acute 07/03/2022   Tracheostomy in place 07/03/2022   Urinary retention 07/02/2022   Hypervolemia 07/02/2022   Protein-calorie malnutrition, severe 06/22/2022   Acute respiratory failure with hypercapnia 06/12/2022   COPD with exacerbation 06/12/2022   HTN (hypertension) 06/12/2022   Tobacco abuse 06/12/2022   Anxiety 06/12/2022   Peripheral vascular disease 06/12/2022   Hyperglycemia 06/12/2022   Pulmonary nodules 06/12/2022   Dementia 06/12/2022   Acute metabolic encephalopathy 06/12/2022    PCP:  Patient, [name] Pharmacy:   [facility] - [place], [place] - [address] [place] [address] Phone: [phone] Fax: [phone]         Social  Determinants of Health (SDOH) Social History: SDOH Interventions:     Readmission Risk Interventions

## 2022-07-12 DIAGNOSIS — Z930 Tracheostomy status: Secondary | ICD-10-CM

## 2022-07-12 DIAGNOSIS — Z515 Encounter for palliative care: Secondary | ICD-10-CM

## 2022-07-12 DIAGNOSIS — J9602 Acute respiratory failure with hypercapnia: Secondary | ICD-10-CM | POA: Diagnosis not present

## 2022-07-12 DIAGNOSIS — F0390 Unspecified dementia without behavioral disturbance: Secondary | ICD-10-CM | POA: Diagnosis not present

## 2022-07-12 DIAGNOSIS — Z7189 Other specified counseling: Secondary | ICD-10-CM

## 2022-07-12 DIAGNOSIS — E43 Unspecified severe protein-calorie malnutrition: Secondary | ICD-10-CM

## 2022-07-12 DIAGNOSIS — J441 Chronic obstructive pulmonary disease with (acute) exacerbation: Secondary | ICD-10-CM | POA: Diagnosis not present

## 2022-07-12 LAB — CBC
Hemoglobin: 13 g/dL (ref 12.0–15.0)
MCHC: 32 g/dL (ref 30.0–36.0)
Platelets: 449 10*3/uL — ABNORMAL HIGH (ref 150–400)
RDW: 15.7 % — ABNORMAL HIGH (ref 11.5–15.5)
WBC: 10.8 10*3/uL — ABNORMAL HIGH (ref 4.0–10.5)

## 2022-07-12 LAB — BASIC METABOLIC PANEL
Anion gap: 6 (ref 5–15)
Chloride: 105 mmol/L (ref 98–111)
Creatinine, Ser: 0.64 mg/dL (ref 0.44–1.00)
GFR, Estimated: >60 mL/min (ref >60)
Potassium: 3.5 mmol/L (ref 3.5–5.1)
Sodium: 138 mmol/L (ref 135–145)

## 2022-07-12 LAB — GLUCOSE, CAPILLARY
Glucose-Capillary: 160 mg/dL — ABNORMAL HIGH (ref 70–99)
Glucose-Capillary: 165 mg/dL — ABNORMAL HIGH (ref 70–99)
Glucose-Capillary: 174 mg/dL — ABNORMAL HIGH (ref 70–99)
Glucose-Capillary: 184 mg/dL — ABNORMAL HIGH (ref 70–99)
Glucose-Capillary: 185 mg/dL — ABNORMAL HIGH (ref 70–99)
Glucose-Capillary: 250 mg/dL — ABNORMAL HIGH (ref 70–99)

## 2022-07-12 MED ORDER — DEXTROSE-NACL 5-0.45 % IV SOLN: INTRAVENOUS | Status: DC

## 2022-07-12 MED ORDER — HYDRALAZINE HCL 20 MG/ML IJ SOLN
20.0000 mg | INTRAMUSCULAR | Status: DC | PRN
  Administered 2022-07-12 – 2022-07-21 (×5): 20 mg via INTRAVENOUS
  Filled 2022-07-12 (×6): qty 1

## 2022-07-12 MED ORDER — DEXTROSE 50 % IV SOLN
INTRAVENOUS | Status: AC
  Filled 2022-07-12: qty 50

## 2022-07-12 MED ORDER — POLYETHYLENE GLYCOL 3350 17 G PO PACK: 17.0000 g | PACK | Freq: Every day | ORAL | Status: DC | PRN

## 2022-07-12 MED ORDER — ORAL CARE MOUTH RINSE
15.0000 mL | OROMUCOSAL | Status: DC
  Administered 2022-07-12 – 2022-07-16 (×27): 15 mL via OROMUCOSAL

## 2022-07-12 MED ORDER — OLANZAPINE 10 MG IM SOLR
5.0000 mg | Freq: Once | INTRAMUSCULAR | Status: DC
  Filled 2022-07-12: qty 10

## 2022-07-12 MED ORDER — DEXTROSE 50 % IV SOLN
12.5000 g | INTRAVENOUS | Status: AC
  Administered 2022-07-12: 12.5 g via INTRAVENOUS

## 2022-07-12 MED ORDER — CLONAZEPAM 0.5 MG PO TABS
0.5000 mg | ORAL_TABLET | Freq: Every day | ORAL | Status: DC
  Administered 2022-07-12 – 2022-07-13 (×2): 0.5 mg via ORAL
  Filled 2022-07-12 (×2): qty 1

## 2022-07-12 MED ORDER — ORAL CARE MOUTH RINSE
15.0000 mL | OROMUCOSAL | Status: DC
  Administered 2022-07-12: 15 mL via OROMUCOSAL

## 2022-07-12 MED ORDER — SENNA 8.6 MG PO TABS: 1.0000 | ORAL_TABLET | Freq: Every day | ORAL | Status: DC | PRN

## 2022-07-12 MED ORDER — POTASSIUM CHLORIDE 20 MEQ PO PACK
40.0000 meq | PACK | Freq: Once | ORAL | Status: AC
  Administered 2022-07-12: 40 meq
  Filled 2022-07-12: qty 2

## 2022-07-12 MED ORDER — CLONAZEPAM 1 MG PO TABS
1.0000 mg | ORAL_TABLET | Freq: Every day | ORAL | Status: DC
  Administered 2022-07-12: 1 mg
  Filled 2022-07-12: qty 1

## 2022-07-12 NOTE — Evaluation (Addendum)
Clinical/Bedside Swallow Evaluation Patient Details  Name: Kearah C Stclair MRN: [id number] Date of Birth: 10/16/1945  Today's Date: 07/12/2022 Time: SLP Start Time (ACUTE ONLY): 1150 SLP Stop Time (ACUTE ONLY): 1200 SLP Time Calculation (min) (ACUTE ONLY): 10 min  Past Medical History: No past medical history on file. HPI:  [name] is a 77 yo female who was found by family unresponsive and brought to ER 06/12/22.  Found to have acute on chronic hypercapnia on ABG.  Intubated in ER and unable to wean from vent.  Initial plans for one way extubation, but with change in goals. Tracheostomy placed 4/8.  Difficulty weaning from vent, but tolerating trach collar for 48 hours as of 4/18. Pt with with hx COPD from emphysema, tobacco abuse, peripheral vascular disease, hypertension, prediabetes, anxiety, baseline dementia.    Assessment / Plan / Recommendation  Clinical Impression  Pt participated in preliminary clinical swallowing assessment to assess readiness for instrumental study. She was alert though drowsy. PMV in place and able to achieve voice as described in preceding progress note. Not reliably following commands. However, she accepted ice chips with good attention and recognition. She demonstrated active mastication of ice chips and achieved a palpable swallow. There was occasional intermittent and delayed coughing noted post-swallow.  Due to presence of trach and higher rate of silent aspiration in this pt population,  recommend proceeding with FEES/MBS in the next few days, likely when LOA is more consistent. D/W RN.  Prognosis for oral diet appears to be promising. SLP Visit Diagnosis: Dysphagia, oropharyngeal phase (R13.12)    Aspiration Risk   (tba)    Diet Recommendation   NPO, continue cortrak, plan for instrumental swallow study in the next few days       Other  Recommendations Oral Care Recommendations: Oral care QID    Recommendations for follow up therapy are one  component of a multi-disciplinary discharge planning process, led by the attending physician.  Recommendations may be updated based on patient status, additional functional criteria and insurance authorization.  Follow up Recommendations  (continue SLP next level of care)      Assistance Recommended at Discharge Frequent or constant Supervision/Assistance    Swallow Study   General HPI: [name] is a 77 yo female who was found by family unresponsive and brought to ER 06/12/22.  Found to have acute on chronic hypercapnia on ABG.  Intubated in ER and unable to wean from vent.  Initial plans for one way extubation, but with change in goals. Tracheostomy placed 4/8.  Difficulty weaning from vent, but tolerating trach collar for 48 hours as of 4/18. Pt with with hx COPD from emphysema, tobacco abuse, peripheral vascular disease, hypertension, prediabetes, anxiety, baseline dementia. Type of Study: Bedside Swallow Evaluation Previous Swallow Assessment: no Diet Prior to this Study: NPO;Cortrak/Small bore NG tube Temperature Spikes Noted: No Respiratory Status: Trach;Trach Collar Trach Size and Type: Cuff;#6;With PMSV in place History of Recent Intubation: Yes Total duration of intubation (days): 19 days Date extubated:  (trach 4/8) Behavior/Cognition: Lethargic/Drowsy Oral Cavity Assessment: Within Functional Limits Oral Care Completed by SLP: Recent completion by staff Oral Cavity - Dentition: Missing dentition Self-Feeding Abilities: Total assist Patient Positioning: Upright in bed Baseline Vocal Quality: Hoarse;Low vocal intensity Volitional Cough: Cognitively unable to elicit Volitional Swallow: Unable to elicit    Oral/Motor/Sensory Function Overall Oral Motor/Sensory Function: Other (comment) (symmetric at baseline)   Ice Chips Ice chips: Impaired Presentation: Spoon Pharyngeal Phase Impairments: Cough - Delayed   Thin Liquid   Thin Liquid: Not tested    Nectar Thick Nectar  Thick Liquid: Not tested   Honey Thick Honey Thick Liquid: Not tested   Puree Puree: Not tested   Solid     Solid: Not tested      Dillan Lunden Laurice 07/12/2022,12:32 PM  Missi Mcmackin L. Arish Redner, MA CCC/SLP Clinical Specialist - Acute Care SLP Acute Rehabilitation Services Office number [phone]   

## 2022-07-12 NOTE — Evaluation (Incomplete Revision)
Clinical/Bedside Swallow Evaluation Patient Details  Name: Carla [name] MRN: [id number] Date of Birth: 10/16/1945  Today's Date: 07/12/2022 Time: SLP Start Time (ACUTE ONLY): 1150 SLP Stop Time (ACUTE ONLY): 1200 SLP Time Calculation (min) (ACUTE ONLY): 10 min  Past Medical History: No past medical history on file. HPI:  Carla [name] is a 77 yo female who was found by family unresponsive and brought to ER 06/12/22.  Found to have acute on chronic hypercapnia on ABG.  Intubated in ER and unable to wean from vent.  Initial plans for one way extubation, but with change in goals. Tracheostomy placed 4/8.  Difficulty weaning from vent, but tolerating trach collar for 48 hours as of 4/18. Pt with with hx COPD from emphysema, tobacco abuse, peripheral vascular disease, hypertension, prediabetes, anxiety, baseline dementia.    Assessment / Plan / Recommendation  Clinical Impression  Pt participated in preliminary clinical swallowing assessment to assess readiness for instrumental study. She was alert though drowsy. PMV in place and able to achieve voice as described in preceding progress note. Not reliably following commands. However, she accepted ice chips with good attention and recognition. She demonstrated active mastication of ice chips and achieved a palpable swallow. There was occasional intermittent and delayed coughing noted post-swallow.  Due to presence of trach and higher rate of silent aspiration in this pt population,  recommend proceeding with FEES/MBS in the next few days, likely when LOA is more consistent. D/W RN.  Prognosis for oral diet appears to be promising. SLP Visit Diagnosis: Dysphagia, oropharyngeal phase (R13.12)    Aspiration Risk   (tba)    Diet Recommendation   NPO, continue cortrak, plan for instrumental swallow study in the next few days       Other  Recommendations Oral Care Recommendations: Oral care QID    Recommendations for follow up therapy are one  component of a multi-disciplinary discharge planning process, led by the attending physician.  Recommendations may be updated based on patient status, additional functional criteria and insurance authorization.  Follow up Recommendations  (continue SLP next level of care)      Assistance Recommended at Discharge Frequent or constant Supervision/Assistance    Swallow Study   General HPI: Carla [name] is a 77 yo female who was found by family unresponsive and brought to ER 06/12/22.  Found to have acute on chronic hypercapnia on ABG.  Intubated in ER and unable to wean from vent.  Initial plans for one way extubation, but with change in goals. Tracheostomy placed 4/8.  Difficulty weaning from vent, but tolerating trach collar for 48 hours as of 4/18. Pt with with hx COPD from emphysema, tobacco abuse, peripheral vascular disease, hypertension, prediabetes, anxiety, baseline dementia. Type of Study: Bedside Swallow Evaluation Previous Swallow Assessment: no Diet Prior to this Study: NPO;Cortrak/Small bore NG tube Temperature Spikes Noted: No Respiratory Status: Trach;Trach Collar Trach Size and Type: Cuff;#6;With PMSV in place History of Recent Intubation: Yes Total duration of intubation (days): 19 days Date extubated:  (trach 4/8) Behavior/Cognition: Lethargic/Drowsy Oral Cavity Assessment: Within Functional Limits Oral Care Completed by SLP: Recent completion by staff Oral Cavity - Dentition: Missing dentition Self-Feeding Abilities: Total assist Patient Positioning: Upright in bed Baseline Vocal Quality: Hoarse;Low vocal intensity Volitional Cough: Cognitively unable to elicit Volitional Swallow: Unable to elicit    Oral/Motor/Sensory Function Overall Oral Motor/Sensory Function: Other (comment) (symmetric at baseline)   Ice Chips Ice chips: Impaired Presentation: Spoon Pharyngeal Phase Impairments: Cough - Delayed   Thin Liquid   Thin Liquid: Not tested    Nectar Thick Nectar  Thick Liquid: Not tested   Honey Thick Honey Thick Liquid: Not tested   Puree Puree: Not tested   Solid     Solid: Not tested      Carla [name] 07/12/2022,12:32 PM  Missi Mcmackin L. Arish Redner, MA CCC/SLP Clinical Specialist - Acute Care SLP Acute Rehabilitation Services Office number [phone]   

## 2022-07-12 NOTE — Progress Notes (Addendum)
eLink Physician-Brief Progress Note Patient Name: Carla [name] DOB: 1945-11-03 MRN: [id number]   Date of Service  07/12/2022  HPI/Events of Note  77 year old female that initially presented unresponsive to the emergency department found to be in hypercapnic respiratory failure, demented, intubated due to COPD exacerbation with haemophilus influenza bacteremia.  Eventually trached on 4/8 waiting for LTAC approval.  She has had ongoing agitated delirium and Being placed back on Precedex.  Ongoing delirium tonight, restless and trying to get out of bed.  Received several doses of Versed which are not helping.  eICU Interventions  QTc less than 400, would like to attempt olanzapine IV once     Intervention Category Minor Interventions: Agitation / anxiety - evaluation and management  Carla [name] 07/12/2022, 4:42 AM

## 2022-07-12 NOTE — Progress Notes (Addendum)
Speech Language Pathology Treatment: Passy Muir Speaking valve  Patient Details Name: Carla [name] MRN: [id number] DOB: 09/18/1945 Today's Date: 07/12/2022 Time: 1141-1150 SLP Time Calculation (min) (ACUTE ONLY): 9 min  Assessment / Plan / Recommendation Clinical Impression  Carla [name] was more alert today. When PMV was placed, she spoke spontaneously, primarily engaging in social communication. She stated name and month in which she was born.  Further verbalizations were poorly intelligible due to low volume/reduced effort.  Followed ~25% simple commands; restless and distracted by soft mitts. Vital signs remained stable throughout session; no suggestion of air trapping upon PMV removal.   Recommend ongoing use of PMV with staff supervision. Overall with + toleration today. D/W RN.   HPI HPI: Carla [name] is a 77 yo female who was found by family unresponsive and brought to ER 06/12/22.  Found to have acute on chronic hypercapnia on ABG.  Intubated in ER and unable to wean from vent.  Initial plans for one way extubation, but with change in goals. Tracheostomy placed 4/8.  Difficulty weaning from vent, but tolerating trach collar for 48 hours as of 4/18. Pt with with hx COPD from emphysema, tobacco abuse, peripheral vascular disease, hypertension, prediabetes, anxiety, baseline dementia.      SLP Plan  Continue with current plan of care      Recommendations for follow up therapy are one component of a multi-disciplinary discharge planning process, led by the attending physician.  Recommendations may be updated based on patient status, additional functional criteria and insurance authorization.    Recommendations         Patient may use Passy-Muir Speech Valve: Intermittently with supervision PMSV Supervision: Full MD: consider changing trach tube to cuffless when appropriate           Oral care QID   Frequent or constant Supervision/Assistance Aphonia (R49.1)      Continue with current plan of care   Carla [name] L. Mackensie Pilson, MA CCC/SLP Clinical Specialist - Acute Care SLP Acute Rehabilitation Services Office number [phone]   Carla [name]  07/12/2022, 12:15 PM 

## 2022-07-12 NOTE — Progress Notes (Addendum)
NAME:  Carla [name], MRN:  [id number], DOB:  [birth year], LOS: 30 ADMISSION DATE:  06/12/2022, CONSULTATION DATE:  06/13/2022 REFERRING MD:  Dr. Sherryll Burger, [facility], CHIEF COMPLAINT:  short of breath   History of Present Illness:   77 yo female smoker with hx of COPD from emphysema found by family unresponsive and brought to ER.  Found to have acute on chronic hypercapnia on ABG.  Intubated in ER.  She was started on therapy for COPD exacerbation.  PCCM consulted to assist with respiratory management.  Pertinent  Medical History   Hypertension, Dementia, Anxiety, Depression, GERD, Primary hyperparathyroidism, Vit D deficiency, HLD, Osteoporosis  Significant Hospital Events: Including procedures, antibiotic start and stop dates in addition to other pertinent events    3/20 Admitted after being found unresponsive, intubated on ED arrival  CT angio chest >> atherosclerosis, severe coronary calcification, severe LVH, mild centrilobular emphysema, 2 mm nodule LUL, 4 mm nodule LUL Blood culture  >> Haemophilus influenzae 3/22 Had oxygen desaturation and low Vt with SBT this morning. Echo 06/14/22 >> EF 60 to 65%, mod LVH, grade 1 DD, mild/mod AR 3/25 bronch for peak pressures, low tidal volumes with mucus cast at end of ETT, thick secretions caking tube 3/29 Bronched again with purulent secretions LLL with plugging and LLL atelectasis, some increase in O2 requirement on vent, zosyn started for VAP coverage. MRSA nare swab today negative.  3/30 Remains intubated with waxing and weaning agitation on fentanyl and precedex  3/31 Remains on vent with continued issues with agitation when sedation lightened  4/2 family meeting, made DNR, plans for one way extubation 4/5 4/5-no overnight events, plan is for one-way extubation today 4/6-family changes mind regarding one-way extubation, plan will be for tracheostomy 4/8 trach placed 4/10 failed overnight weaning trial, back on PRVC and sedation  was added.  Subsequently sedation weaned and enteral agents were dc and or decreased.  4/11 failed psv w some distress. Fever. Abx added 4/12 cont efforts at psv. Most awake she has been in days   4/13 Tolerating PS.  4/15 Has been on ATC x 1:45 minutes, RR 40 and accessory muscle use, placing back on vent 4/18 Cortrak clogged and thus replaced. Still working on weaning completed CTx for PNA. 48 hrs on ATC  4/19 off vent x 72 hours so removed from room.  Planning on changing trach to cuffless.  Continuing to work with PMV.  Hypoglycemia adding D5 to maintenance fluids  Interim History / Subjective:  Sleepy  Objective   Blood pressure (Abnormal) 163/88, pulse 92, temperature 98 F (36.7 C), temperature source Oral, resp. rate (Abnormal) 28, height 5' (1.524 m), weight 40.5 kg, SpO2 95 %.    FiO2 (%):  [28 %] 28 %   Intake/Output Summary (Last 24 hours) at 07/12/2022 0852 Last data filed at 07/12/2022 4098 Gross per 24 hour  Intake 2669 ml  Output 2075 ml  Net 594 ml   Filed Weights   07/10/22 0500 07/11/22 0707 07/12/22 0500  Weight: 39.3 kg 40.1 kg 40.5 kg   Physical Exam: General this is a 77 year old female patient who is currently resting on aerosol trach collar she is in no acute distress HEENT normocephalic atraumatic size 6 tracheostomy tube is midline.  This is a cuffed tube the cuff is deflated currently.  She is able to make some phonation Pulmonary: Coarse scattered rhonchi no accessory use appears comfortable on aerosol trach collar 28% Cardiac: Regular rate and rhythm Abdomen: Soft nontender Extremities: Warm  dry brisk capillary refill scattered areas of ecchymosis Neuro: Awake, follows commands, still confused, but cooperative.  Resolved Hospital Problem list   H Flu bacteremia Septic shock due to H Flu bacteremia, H Flu PNA  Contraction alkalosis  Assessment & Plan:  Acute encephalopathy superimposed on chronic dementia & complicated by possible opioid/benzo w/d, ICU  delirium and h/o anxiety  Improving.  A little sleepy this morning but she is able to tell me her name Plan cont weaning CNS depressing meds: klonpin 1 mg BID on 4/17 changing this to 0.5 mg in a.m. and 1 mg at at bedtime  oxycodone changed to as needed delirium precautions PT/OT  AoC hypoxic and hypercarbic resp failure 2/2 HFlu PNA S/p tracheostomy 4/8 COPD Hx tobacco use -now off x 72 hrs Plan Remove ventilator from room Manage tracheostomy to cuffless 6 Encourage PMV with speech-language pathology Continue [name] and Pulmicort Out of bed PT Hopeful if she wakes up further we may be able to work towards decannulation  Hx HTN Demand ischemia  Plan defer adding antihypertensives for now  AKI  -resolving Plan Am chem if stable remove from active problem list   Hypernatremia - ongoing but now normalizing Plan Cont free water replacement  Am chem   Inadequate PO intake  Plan TF via  cortrak.  defer PEG eval for now, think we should decr sedating meds first in case her mentation improves enough for PO, I am hopeful she may be able to in the future  Urinary retention  -foley replaced 4/9 and 3/13 Plan Continue foley  Constipation -incr bowel reg 4/11 Plan Cont as ordered  Diabetes, having increased episodes of hypoglycemia requiring D50 replacement Plan Discontinue insulin sliding scale Added dextrose to IV fluids Closer observation of blood glucose Check cortisol   Best Practice (right click and "Reselect all SmartList Selections" daily)   Diet/type: tubefeeds  DVT prophylaxis: LMWH GI prophylaxis: PPI Lines: Central line -- LUE PICC  Foley:  Yes, and it is still needed Code Status:  DNR  My time 32 min

## 2022-07-12 NOTE — Consult Note (Addendum)
Palliative Care Consult Note                                  Date: 07/12/2022   Patient Name: Carla [name]  DOB: 05/10/1945  MRN: [id number]  Age / Sex: 77 y.o., female  PCP: Patient, [name] Referring Physician: Chilton Greathouse, MD  Reason for Consultation: Establishing goals of care  HPI/Patient Profile: 77 y.o. female  with past medical history of COPD from emphysema who presented to [facility] ED on 06/12/2022 after been found unresponsive at home. She was found to have acute on chronic hypercapnia. Intubated in the ED. Admitted with acute on chronic respiratory failure secondary to COPD exacerbation.She had prolonged ventilator dependence and underwent tracheostomy on 4/8.  Palliative Medicine was consulted for goals of care.    Subjective:   I have reviewed medical records including progress notes, labs and imaging, discussed with RN, and assessed the patient at bedside. She is lethargic and does not follow commands.   I spoke her husband/Douglas and son/Mark by phone to discuss diagnosis, prognosis, GOC, disposition, and options.  I introduced Palliative Medicine as specialized medical care for people living with serious illness. It focuses on providing relief from the symptoms and stress of a serious illness.   A brief life review was discussed. Patient has lived in [place], [place] her entire life. She retired from working in a Hydrologist for many years. She and Riley Lam have been married for 60 years. They have 2 sons - Loraine Leriche and [address]   Loraine Leriche reports he has lived with his parents for the past 1.5 years. Due to patient's dementia, she has required more frequent supervision to prevent her from wandering from home. Regarding functional status, family reports patient was ambulatory and independent with her ADLs.   We discussed patient's current medical situation and what it means in the larger context of her ongoing co-morbidities. Current  clinical status was reviewed. Natural disease trajectory of chronic illness was discussed.  Discussion was had regarding the seriousness of patient's current medical situation. We discussed her acute on chronic respiratory failure in the setting of COPD. Family reports feeling very encouraged that patient has been off the ventilator for 3 days. We also discussed her altered mental status (lethargy) in the setting of underlying dementia. Discussed that due to her mental status and high risk for aspiration, patient remains NPO for now. Discussed that SLP has recommended MBS/FEES in the next few days.   Family expresses feeling overall that patient has greatly improved since she was admitted. They are hopeful that she will continue to improve. They understand that she will need rehab when medically stable for discharge. Discussed that she will likely need LTACH placement, as most SNFs will not accept patients with a tracheostomy. Family is hopeful that patient can ultimately return home.    Questions and concerns addressed. Discussed the importance of continued conversation with family and the medical team regarding overall plan of care and treatment options.    Review of Systems  Unable to perform ROS   Objective:   Primary Diagnoses: Present on Admission:  Acute respiratory failure with hypercapnia  COPD with exacerbation  HTN (hypertension)  Tobacco abuse  Anxiety  Peripheral vascular disease  Hyperglycemia  Pulmonary nodules  Dementia  Acute metabolic encephalopathy   Physical Exam Vitals reviewed.  Constitutional:      General: She is not in acute distress.  Appearance: She is ill-appearing.  Pulmonary:     Comments: Trach collar Neurological:     Mental Status: She is lethargic.     Comments: Does not follow commands     Vital Signs:  BP 133/72   Pulse (!) 113   Temp 97.6 F (36.4 C) (Axillary)   Resp (!) 37   Ht 5' (1.524 m)   Wt 40.5 kg   SpO2 98%   BMI  17.44 kg/m   Palliative Assessment/Data: PPS 30%     Assessment & Plan:   SUMMARY OF RECOMMENDATIONS   DNR as previously documented Continue full scope care Family is hopeful for improvement  PMT will continue to follow  Primary Decision Maker: NEXT OF KIN - spouse  Prognosis:  Unable to determine  Discharge Planning:  To Be Determined     Thank you for allowing [place] to participate in the care of Lorn Junes  MDM - High   Signed by: Sherlean Foot, NP Palliative Medicine Team  Team Phone # [phone]  For individual providers, please see AMION

## 2022-07-12 NOTE — Progress Notes (Incomplete Revision)
Speech Language Pathology Treatment: Passy Muir Speaking valve  Patient Details Name: Carla [name] MRN: [id number] DOB: 09/18/1945 Today's Date: 07/12/2022 Time: 1141-1150 SLP Time Calculation (min) (ACUTE ONLY): 9 min  Assessment / Plan / Recommendation Clinical Impression  Carla [name] was more alert today. When PMV was placed, she spoke spontaneously, primarily engaging in social communication. She stated name and month in which she was born.  Further verbalizations were poorly intelligible due to low volume/reduced effort.  Followed ~25% simple commands; restless and distracted by soft mitts. Vital signs remained stable throughout session; no suggestion of air trapping upon PMV removal.   Recommend ongoing use of PMV with staff supervision. Overall with + toleration today. D/W RN.   HPI HPI: Carla [name] is a 77 yo female who was found by family unresponsive and brought to ER 06/12/22.  Found to have acute on chronic hypercapnia on ABG.  Intubated in ER and unable to wean from vent.  Initial plans for one way extubation, but with change in goals. Tracheostomy placed 4/8.  Difficulty weaning from vent, but tolerating trach collar for 48 hours as of 4/18. Pt with with hx COPD from emphysema, tobacco abuse, peripheral vascular disease, hypertension, prediabetes, anxiety, baseline dementia.      SLP Plan  Continue with current plan of care      Recommendations for follow up therapy are one component of a multi-disciplinary discharge planning process, led by the attending physician.  Recommendations may be updated based on patient status, additional functional criteria and insurance authorization.    Recommendations         Patient may use Passy-Muir Speech Valve: Intermittently with supervision PMSV Supervision: Full MD: consider changing trach tube to cuffless when appropriate           Oral care QID   Frequent or constant Supervision/Assistance [place]ph[place] (R49.1)      Continue with current plan of care   Carla [name] L. Carla Pilson, MA CCC/SLP Clinical Specialist - Acute Care SLP Acute Rehabilitation Services Office number [phone]   Carla [name] Carla [name]  07/12/2022, 12:15 PM 

## 2022-07-12 NOTE — Progress Notes (Addendum)
Physical Therapy Treatment Patient Details Name: ALEIDY BICKER MRN: [id number] DOB: [birth year] Today's Date: 07/12/2022   History of Present Illness 77 yo female admitted 3/20 unresponsive from home with acute respiratory failure with hypoxia and hypercapnia and metabolic encephalopathy. Intubated 3/20. trach 4/8. PMhx: emphysema, tobacco abuse, PVD, HTN, prediabetes, anxiety, dementia    PT Comments    Pt's eyes closed and not opening to PT verbal or tactile stimulation, but pt restless and attempting to slide mitts off. Once pt was sitting EOB, pt more awake and nodding yes/no but not following commands and continues to be restless. Pt requiring total +2 assist for to/from EOB, once EOB pt able to sit with periods of supervision only, does have multidirectional and unexpected LOB requiring PT intervention. Pt unable to come to standing with total +2 today. Will continue to follow.      Recommendations for follow up therapy are one component of a multi-disciplinary discharge planning process, led by the attending physician.  Recommendations may be updated based on patient status, additional functional criteria and insurance authorization.  Follow Up Recommendations       Assistance Recommended at Discharge Frequent or constant Supervision/Assistance  Patient can return home with the following Two people to help with walking and/or transfers;Two people to help with bathing/dressing/bathroom;Direct supervision/assist for medications management;Assistance with cooking/housework;Assist for transportation   Equipment Recommendations  Hospital bed;Other (comment)    Recommendations for Other Services       Precautions / Restrictions Precautions Precautions: Fall Precaution Comments: trach collar 6L/28%, cortrak (paused during session) Restrictions Weight Bearing Restrictions: No     Mobility  Bed Mobility Overal bed mobility: Needs Assistance Bed Mobility: Supine to Sit, Sit to  Supine     Supine to sit: HOB elevated, Total assist, +2 for safety/equipment Sit to supine: Total assist, +2 for safety/equipment   General bed mobility comments: all aspects, pt does initiate LE lift back into bed but still total assist    Transfers Overall transfer level: Needs assistance Equipment used: 2 person hand held assist Transfers: Sit to/from Stand Sit to Stand: Total assist, +2 physical assistance           General transfer comment: total +2 for attempted rise to stand for getting higher towards HOB, pt barely able to clear buttocks    Ambulation/Gait                   Stairs             Wheelchair Mobility    Modified Rankin (Stroke Patients Only)       Balance Overall balance assessment: Needs assistance Sitting-balance support: Feet supported, Bilateral upper extremity supported Sitting balance-Leahy Scale: Fair Sitting balance - Comments: fair to poor sitting balance, periods of min assist to correct multidirectional LOB   Standing balance support: Bilateral upper extremity supported Standing balance-Leahy Scale: Zero                              Cognition Arousal/Alertness: Lethargic Behavior During Therapy: Flat affect, Restless Overall Cognitive Status: Difficult to assess                                 General Comments: pt drowsy initially, rouses once EOB. Pt restless, pulling at bilat mitts and swatting when PT attempting to tighten them. Pt nods yes/no appropriately once sitting EOB, does  not follow commands during session        Exercises      General Comments General comments (skin integrity, edema, etc.): undressed skin tear noted on pt's L forearm on dorsal surface, RN made aware      Pertinent Vitals/Pain Pain Assessment Pain Assessment: Faces Faces Pain Scale: Hurts a little bit Pain Location: generalized, restless Pain Descriptors / Indicators: Discomfort, Guarding Pain  Intervention(s): Limited activity within patient's tolerance, Monitored during session, Repositioned    Home Living                          Prior Function            PT Goals (current goals can now be found in the care plan section) Acute Rehab PT Goals Patient Stated Goal: return to moving and walking PT Goal Formulation: With family Time For Goal Achievement: 07/16/22 Potential to Achieve Goals: Poor Progress towards PT goals: Progressing toward goals    Frequency    Min 1X/week      PT Plan Current plan remains appropriate    Co-evaluation              AM-PAC PT "6 Clicks" Mobility   Outcome Measure  Help needed turning from your back to your side while in a flat bed without using bedrails?: Total Help needed moving from lying on your back to sitting on the side of a flat bed without using bedrails?: Total Help needed moving to and from a bed to a chair (including a wheelchair)?: Total Help needed standing up from a chair using your arms (e.g., wheelchair or bedside chair)?: Total Help needed to walk in hospital room?: Total Help needed climbing 3-5 steps with a railing? : Total 6 Click Score: 6    End of Session   Activity Tolerance: Patient limited by fatigue Patient left: in bed;with call bell/phone within reach;with bed alarm set;with SCD's reapplied Nurse Communication: Mobility status PT Visit Diagnosis: Other abnormalities of gait and mobility (R26.89);Muscle weakness (generalized) (M62.81);Difficulty in walking, not elsewhere classified (R26.2)     Time: 1415-1430 PT Time Calculation (min) (ACUTE ONLY): 15 min  Charges:  $Therapeutic Activity: 8-22 mins                     [name], PT DPT Acute Rehabilitation Services Secure Chat Preferred  Office [phone]    Tynleigh Birt E [name] 07/12/2022, 4:40 PM

## 2022-07-13 DIAGNOSIS — G9340 Encephalopathy, unspecified: Secondary | ICD-10-CM | POA: Diagnosis not present

## 2022-07-13 DIAGNOSIS — L8990 Pressure ulcer of unspecified site, unspecified stage: Secondary | ICD-10-CM | POA: Insufficient documentation

## 2022-07-13 DIAGNOSIS — J9602 Acute respiratory failure with hypercapnia: Secondary | ICD-10-CM | POA: Diagnosis not present

## 2022-07-13 DIAGNOSIS — J9601 Acute respiratory failure with hypoxia: Secondary | ICD-10-CM | POA: Diagnosis not present

## 2022-07-13 DIAGNOSIS — E871 Hypo-osmolality and hyponatremia: Secondary | ICD-10-CM

## 2022-07-13 DIAGNOSIS — Z930 Tracheostomy status: Secondary | ICD-10-CM | POA: Diagnosis not present

## 2022-07-13 LAB — BASIC METABOLIC PANEL
Anion gap: 6 (ref 5–15)
BUN: 42 mg/dL — ABNORMAL HIGH (ref 8–23)
Chloride: 103 mmol/L (ref 98–111)
Creatinine, Ser: 0.71 mg/dL (ref 0.44–1.00)
GFR, Estimated: >60 mL/min (ref >60)
Glucose, Bld: 189 mg/dL — ABNORMAL HIGH (ref 70–99)
Potassium: 4.2 mmol/L (ref 3.5–5.1)

## 2022-07-13 LAB — GLUCOSE, CAPILLARY: Glucose-Capillary: 148 mg/dL — ABNORMAL HIGH (ref 70–99)

## 2022-07-13 LAB — MAGNESIUM: Magnesium: 2.3 mg/dL (ref 1.7–2.4)

## 2022-07-13 MED ORDER — FREE WATER: 200.0000 mL | Freq: Four times a day (QID) | Status: DC

## 2022-07-13 MED ORDER — CLONAZEPAM 0.5 MG PO TABS
0.5000 mg | ORAL_TABLET | Freq: Every day | ORAL | Status: DC
  Administered 2022-07-15 – 2022-07-22 (×7): 0.5 mg
  Filled 2022-07-13 (×8): qty 1

## 2022-07-13 MED ORDER — CLONAZEPAM 0.5 MG PO TABS
0.5000 mg | ORAL_TABLET | Freq: Every day | ORAL | Status: DC
  Administered 2022-07-13 – 2022-07-21 (×7): 0.5 mg
  Filled 2022-07-13 (×10): qty 1

## 2022-07-13 NOTE — Progress Notes (Addendum)
NAME:  Carla [name], MRN:  [id number], DOB:  1946/01/21, LOS: 31 ADMISSION DATE:  06/12/2022, CONSULTATION DATE:  06/13/2022 REFERRING MD:  Dr. Sherryll Burger, [facility], CHIEF COMPLAINT:  short of breath   History of Present Illness:   77 yo female smoker with hx of COPD from emphysema found by family unresponsive and brought to ER.  Found to have acute on chronic hypercapnia on ABG.  Intubated in ER.  She was started on therapy for COPD exacerbation.  PCCM consulted to assist with respiratory management.  Pertinent  Medical History   Hypertension, Dementia, Anxiety, Depression, GERD, Primary hyperparathyroidism, Vit D deficiency, HLD, Osteoporosis  Significant Hospital Events: Including procedures, antibiotic start and stop dates in addition to other pertinent events    3/20 Admitted after being found unresponsive, intubated on ED arrival  CT angio chest >> atherosclerosis, severe coronary calcification, severe LVH, mild centrilobular emphysema, 2 mm nodule LUL, 4 mm nodule LUL Blood culture  >> Haemophilus influenzae 3/22 Had oxygen desaturation and low Vt with SBT this morning. Echo 06/14/22 >> EF 60 to 65%, mod LVH, grade 1 DD, mild/mod AR 3/25 bronch for peak pressures, low tidal volumes with mucus cast at end of ETT, thick secretions caking tube 3/29 Bronched again with purulent secretions LLL with plugging and LLL atelectasis, some increase in O2 requirement on vent, zosyn started for VAP coverage. MRSA nare swab today negative.  3/30 Remains intubated with waxing and weaning agitation on fentanyl and precedex  3/31 Remains on vent with continued issues with agitation when sedation lightened  4/2 family meeting, made DNR, plans for one way extubation 4/5 4/5-no overnight events, plan is for one-way extubation today 4/6-family changes mind regarding one-way extubation, plan will be for tracheostomy 4/8 trach placed 4/10 failed overnight weaning trial, back on PRVC and sedation  was added.  Subsequently sedation weaned and enteral agents were dc and or decreased.  4/11 failed psv w some distress. Fever. Abx added 4/12 cont efforts at psv. Most awake she has been in days   4/13 Tolerating PS.  4/15 Has been on ATC x 1:45 minutes, RR 40 and accessory muscle use, placing back on vent 4/18 Cortrak clogged and thus replaced. Still working on weaning completed CTx for PNA. 48 hrs on ATC  4/19 off vent x 72 hours so removed from room.  Planning on changing trach to cuffless.  Continuing to work with PMV.  Hypoglycemia adding D5 to maintenance fluids 4/20 transfer out of ICU   Interim History / Subjective:  NAEO   Objective   Blood pressure (!) 163/81, pulse (!) 104, temperature 98.6 F (37 C), temperature source Bladder, resp. rate (!) 30, height 5' (1.524 m), weight 41.1 kg, SpO2 100 %.    FiO2 (%):  [28 %] 28 %   Intake/Output Summary (Last 24 hours) at 07/13/2022 0814 Last data filed at 07/13/2022 0730 Gross per 24 hour  Intake 932.97 ml  Output 1465 ml  Net -532.03 ml   Filed Weights   07/11/22 0707 07/12/22 0500 07/13/22 0354  Weight: 40.1 kg 40.5 kg 41.1 kg   Physical Exam General: chronically ill frail elderly F  Neuro: Moving spontaneously, non-compliant with exam, very strong and purposeful movements  HEENT: NCAT. Pink mmm cuffless trach  Pulmonary: Even unlabored on ATC  Cardiac: tachy, regular Abdomen: thin ndnt  Extremities: no acute joint deformity, decr muscle mass  Skin: scattered ecchymosis, tears   Resolved Hospital Problem list   H Flu bacteremia Septic shock  due to H Flu bacteremia, H Flu PNA  Contraction alkalosis  Assessment & Plan:    Acute encephalopathy superimposed on underlying dementia Hx Anxiety ICU delirium  P -cont weaning CNS depressing meds -- have made a lot of progress w BZD and opiates as well as atypicals -delirium precautions  AoC resp failure w hypoxia and hypercarbia -Hflu PNA S/p tracheostomy COPD Tobacco use  -has  been on trach collar for several days straight, trach changed to cuffless 4/19  Plan -cuffless trach, routine care  -cont SLP efforts  -brovana pulmicort yupelri  -pulm hygiene, mobility  -stable to transfer out if ICU 4/20 -- PCCM will continue to follow trach   HTN -cardiac monitoring   AKI Hypernatremia --> iatrogenic hyponatremia, mild  Urinary retention  P -cont foley  -decr FWF to 200 q6 from q4  -Bmp a couple times a week   Inadequate PO intake  Constipation  P -deferring PEG for now as mentation cont to clear -EN via cortak -cont bowel reg  -FEES/MBS when more consistent attention span w SLP   DM with hypoglycemia  -close CBG monitoring, correct as needed    Best Practice right click and "Reselect all SmartList Selections" daily)   Diet/type: tubefeeds  DVT prophylaxis: LMWH GI prophylaxis: PPI Lines: Central line -- LUE PICC  Foley:  Yes, and it is still needed Code Status:  DNR  CCT: na    Tessie Fass MSN, AGACNP-BC [facility] Amion for pager  07/13/2022, 8:15 AM

## 2022-07-14 ENCOUNTER — Inpatient Hospital Stay (HOSPITAL_COMMUNITY): Payer: HMO

## 2022-07-14 DIAGNOSIS — I10 Essential (primary) hypertension: Secondary | ICD-10-CM | POA: Diagnosis not present

## 2022-07-14 DIAGNOSIS — F0390 Unspecified dementia without behavioral disturbance: Secondary | ICD-10-CM | POA: Diagnosis not present

## 2022-07-14 DIAGNOSIS — J9602 Acute respiratory failure with hypercapnia: Secondary | ICD-10-CM | POA: Diagnosis not present

## 2022-07-14 DIAGNOSIS — N179 Acute kidney failure, unspecified: Secondary | ICD-10-CM

## 2022-07-14 DIAGNOSIS — G9340 Encephalopathy, unspecified: Secondary | ICD-10-CM

## 2022-07-14 DIAGNOSIS — J9601 Acute respiratory failure with hypoxia: Secondary | ICD-10-CM | POA: Diagnosis not present

## 2022-07-14 DIAGNOSIS — Z4659 Encounter for fitting and adjustment of other gastrointestinal appliance and device: Secondary | ICD-10-CM | POA: Diagnosis not present

## 2022-07-14 LAB — GLUCOSE, CAPILLARY
Glucose-Capillary: 117 mg/dL — ABNORMAL HIGH (ref 70–99)
Glucose-Capillary: 138 mg/dL — ABNORMAL HIGH (ref 70–99)
Glucose-Capillary: 149 mg/dL — ABNORMAL HIGH (ref 70–99)
Glucose-Capillary: 169 mg/dL — ABNORMAL HIGH (ref 70–99)
Glucose-Capillary: 184 mg/dL — ABNORMAL HIGH (ref 70–99)
Glucose-Capillary: 212 mg/dL — ABNORMAL HIGH (ref 70–99)

## 2022-07-14 MED ORDER — LORAZEPAM 2 MG/ML IJ SOLN
0.5000 mg | Freq: Once | INTRAMUSCULAR | Status: AC
  Administered 2022-07-14: 0.5 mg via INTRAVENOUS
  Filled 2022-07-14: qty 1

## 2022-07-14 NOTE — Progress Notes (Addendum)
   [facility]                                                                               [name], is a 77 y.o. female, DOB - 07/10/1945, MRN:[id number] Admit date - 06/12/2022    Outpatient Primary MD for the patient is Patient, [name]  LOS - 32  days    Brief summary   77 yo female smoker with hx of COPD from emphysema, Hypertension, Dementia, Anxiety, Depression, GERD, Primary hyperparathyroidism, Vit D deficiency, HLD, Osteoporosis , found by family unresponsive and brought to ER. Found to have acute on chronic hypercapnia on ABG. Intubated in ER. She was started on therapy for COPD exacerbation. PCCM consulted to assist with respiratory management.    Significant Events  3/20 Admitted after being found unresponsive, intubated on ED arrival  CT angio chest >> atherosclerosis, severe coronary calcification, severe LVH, mild centrilobular emphysema, 2 mm nodule LUL, 4 mm nodule LUL Blood culture  >> Haemophilus influenzae 3/22 Had oxygen desaturation and low Vt with SBT this morning. Echo 06/14/22 >> EF 60 to 65%, mod LVH, grade 1 DD, mild/mod AR 3/25 bronch for peak pressures, low tidal volumes with mucus cast at end of ETT, thick secretions caking tube 3/29 Bronched again with purulent secretions LLL with plugging and LLL atelectasis, some increase in O2 requirement on vent, zosyn started for VAP coverage. MRSA nare swab today negative.  3/30 Remains intubated with waxing and weaning agitation on fentanyl and precedex  3/31 Remains on vent with continued issues with agitation when sedation lightened  4/2 family meeting, made DNR, plans for one way extubation 4/5 4/5-no overnight events, plan is for one-way extubation today 4/6-family changes mind regarding one-way extubation, plan will be for tracheostomy 4/8 trach placed 4/10 failed overnight weaning trial, back on PRVC and sedation  was added. Subsequently sedation weaned and enteral agents were dc and or  decreased.  4/11 failed psv w some distress. Fever. Abx added 4/12 cont efforts at psv. Most awake she has been in days   4/13 Tolerating PS.  4/15 Has been on ATC x 1:45 minutes, RR 40 and accessory muscle use, placing back on vent 4/18 Cortrak clogged and thus replaced. Still working on weaning completed CTx for PNA. 48 hrs on ATC  4/19 off vent x 72 hours so removed from room.  Planning on changing trach to cuffless.  Continuing to work with PMV.  Hypoglycemia adding D5 to maintenance fluids 4/20 transfer out of ICU  4/21 transfer to [facility]. Agitated on restraints.   Assessment & Plan    Assessment and Plan:    * Acute respiratory failure with hypoxia and hypercapnia S/p intubation, unable to extubate.s/p tracheostomy , changed to trach collar and cuffless on 4/19.  Continue with brovana, pulmicort and  yupelri.     Acute metabolic encephalopathy With baseline Dementia.   Presented with unresponsiveness.  Head CT negative for acute abnormality.  Likely due to hypercarbic respiratory failure and ICU delirium.  On restraints at this time.   Type 2 DM:  CBG (last 3)  Recent Labs    07/14/22 0456 07/14/22 0807 07/14/22 1128  GLUCAP   212* 138* 117*   Continue with SSI.    AKI;  Resolved.   Pulmonary nodules Incidental finding on CT 06/12/2022 - 2 mm and 4 mm noncalcified left upper lobe lung nodules. No follow-up needed if patient is low-risk (and has no known or suspected primary neoplasm). Non-contrast chest CT can be considered in 12 months if patient is high-risk.  -Follow-up as outpatient  HTN (hypertension) Optimally controlled.    Nutrition:  Via cortrak.  SLP on board.  Pulled a little on the cortrak last night.  Abd film shows tip in the stomach.      RN Pressure Injury Documentation: Pressure Injury 07/10/22 Nare Right;Medial Stage 2 -  Partial thickness loss of dermis presenting as a shallow open injury with a red, pink wound bed without slough. small,  pink/red area due to bridle from Cortrak (Active)  07/10/22 0954  Location: Nare  Location Orientation: Right;Medial  Staging: Stage 2 -  Partial thickness loss of dermis presenting as a shallow open injury with a red, pink wound bed without slough.  Wound Description (Comments): small, pink/red area due to bridle from Cortrak  Present on Admission: No  Dressing Type Foam - Lift dressing to assess site every shift 07/14/22 0809    Malnutrition Type:  Nutrition Problem: Severe Malnutrition Etiology: chronic illness (COPD, dementia)   Malnutrition Characteristics:  Signs/Symptoms: severe fat depletion, severe muscle depletion   Nutrition Interventions:  Interventions: Tube feeding  Estimated body mass index is 18.73 kg/m as calculated from the following:   Height as of this encounter: 5' (1.524 m).   Weight as of this encounter: 43.5 kg.  Code Status: DNR DVT Prophylaxis:  enoxaparin (LOVENOX) injection 30 mg Start: 07/02/22 1000   Level of Care: Level of care: Progressive Family Communication: none at bedside.   Disposition Plan:     Remains inpatient appropriate:  s/p trach . S/p Cortrak  Procedures:  S/P trach.   Consultants:   Palliative care PCCM.   Antimicrobials:   Anti-infectives (From admission, onward)    Start     Dose/Rate Route Frequency Ordered Stop   07/06/22 1000  cefTRIAXone (ROCEPHIN) 2 g in sodium chloride 0.9 % 100 mL IVPB        2 g 200 mL/hr over 30 Minutes Intravenous Every 24 hours 07/06/22 0848 07/08/22 0952   07/05/22 0200  piperacillin-tazobactam (ZOSYN) IVPB 3.375 g  Status:  Discontinued        3.375 g 12.5 mL/hr over 240 Minutes Intravenous Every 8 hours 07/04/22 1826 07/06/22 0848   07/04/22 1915  vancomycin (VANCOCIN) IVPB 1000 mg/200 mL premix  Status:  Discontinued        1,000 mg 200 mL/hr over 60 Minutes Intravenous Every 48 hours 07/04/22 1826 07/05/22 0827   07/04/22 1915  piperacillin-tazobactam (ZOSYN) IVPB 3.375 g         3.375 g 100 mL/hr over 30 Minutes Intravenous  Once 07/04/22 1826 07/04/22 1914   06/21/22 2300  piperacillin-tazobactam (ZOSYN) IVPB 3.375 g        3.375 g 12.5 mL/hr over 240 Minutes Intravenous Every 8 hours 06/21/22 1631 06/29/22 2009   06/21/22 1645  piperacillin-tazobactam (ZOSYN) IVPB 3.375 g  Status:  Discontinued        3.375 g 12.5 mL/hr over 240 Minutes Intravenous Every 8 hours 06/21/22 1630 06/21/22 1631   06/21/22 1645  piperacillin-tazobactam (ZOSYN) IVPB 3.375 g        3.375 g 100 mL/hr over 30 Minutes Intravenous STAT   06/21/22 1631 06/21/22 1900   06/14/22 1315  cefTRIAXone (ROCEPHIN) 2 g in sodium chloride 0.9 % 100 mL IVPB  Status:  Discontinued        2 g 200 mL/hr over 30 Minutes Intravenous Every 24 hours 06/14/22 1249 06/20/22 1415   06/12/22 2230  doxycycline (VIBRAMYCIN) 100 mg in sodium chloride 0.9 % 250 mL IVPB  Status:  Discontinued        100 mg 125 mL/hr over 120 Minutes Intravenous Every 12 hours 06/12/22 2139 06/14/22 1350        Medications  Scheduled Meds:  arformoterol  15 mcg Nebulization BID   budesonide (PULMICORT) nebulizer solution  0.5 mg Nebulization BID   Chlorhexidine Gluconate Cloth  6 each Topical Daily   clonazePAM  0.5 mg Per Tube QHS   clonazePAM  0.5 mg Per Tube Daily   enoxaparin (LOVENOX) injection  30 mg Subcutaneous Daily   feeding supplement (PROSource TF20)  60 mL Per Tube Daily   fiber  1 packet Per Tube Daily   nutrition supplement (JUVEN)  1 packet Per Tube BID BM   OLANZapine  5 mg Intravenous Once   mouth rinse  15 mL Mouth Rinse Q2H   pantoprazole (PROTONIX) IV  40 mg Intravenous Daily   revefenacin  175 mcg Nebulization Daily   sodium chloride flush  10-40 mL Intracatheter Q12H   Continuous Infusions:  sodium chloride 10 mL/hr at 07/01/22 0503   sodium chloride 250 mL (07/04/22 1842)   dextrose 5 % and 0.45% NaCl 75 mL/hr at 07/14/22 0654   feeding supplement (OSMOLITE 1.5 CAL) Stopped (07/14/22 0700)    PRN Meds:.acetaminophen **OR** acetaminophen, albuterol, hydrALAZINE, lip balm, midazolam, ondansetron **OR** ondansetron (ZOFRAN) IV, mouth rinse, oxyCODONE, polyethylene glycol, senna, sodium chloride flush    Subjective:   Carla [name] was seen and examined today.  Overnight, she probably pulled the cortrak.  Objective:   Vitals:   07/14/22 0809 07/14/22 0846 07/14/22 1129 07/14/22 1200  BP: (!) 156/79  (!) 175/78 (!) 150/60  Pulse: 98 (!) 102 95 (!) 107  Resp: 20 (!) 23 (!) 21 (!) 29  Temp: 97.8 F (36.6 C)  97.9 F (36.6 C)   TempSrc: Axillary  Axillary   SpO2: 99% 99% 100% 98%  Weight:      Height:        Intake/Output Summary (Last 24 hours) at 07/14/2022 1518 Last data filed at 07/14/2022 1200 Gross per 24 hour  Intake 60 ml  Output 2100 ml  Net -2040 ml   Filed Weights   07/12/22 0500 07/13/22 0354 07/14/22 0500  Weight: 40.5 kg 41.1 kg 43.5 kg     Exam General: Alert and oriented x 3, NAD Cardiovascular: S1 S2 auscultated, no murmurs, RRR Respiratory: Clear to auscultation bilaterally, no wheezing, rales or rhonchi Gastrointestinal: Soft, nontender, nondistended, + bowel sounds Ext: no pedal edema bilaterally Neuro: Alert, following simple commands  Skin: No rashes Psych: restless.    Data Reviewed:  I have personally reviewed following labs and imaging studies   CBC Lab Results  Component Value Date   WBC 10.8 (H) 07/12/2022   RBC 4.26 07/12/2022   HGB 13.0 07/12/2022   HCT 40.6 07/12/2022   MCV 95.3 07/12/2022   MCH 30.5 07/12/2022   PLT 449 (H) 07/12/2022   MCHC 32.0 07/12/2022   RDW 15.7 (H) 07/12/2022   LYMPHSABS 1.1 06/27/2022   MONOABS 0.7 06/27/2022   EOSABS 0.2 06/27/2022   BASOSABS 0.0 06/27/2022       Last metabolic panel Lab Results  Component Value Date   NA 134 (L) 07/13/2022   K 4.2 07/13/2022   CL 103 07/13/2022   CO2 25 07/13/2022   BUN 42 (H) 07/13/2022   CREATININE 0.71 07/13/2022   GLUCOSE 189 (H)  07/13/2022   GFRNONAA >60 07/13/2022   CALCIUM 9.6 07/13/2022   PHOS 2.6 07/08/2022   PROT 7.8 06/12/2022   ALBUMIN 1.8 (L) 06/26/2022   BILITOT 0.6 06/12/2022   ALKPHOS 120 06/12/2022   AST 47 (H) 06/12/2022   ALT 18 06/12/2022   ANIONGAP 6 07/13/2022    CBG (last 3)  Recent Labs    07/14/22 0456 07/14/22 0807 07/14/22 1128  GLUCAP 212* 138* 117*      Coagulation Profile: No results for input(s): "INR", "PROTIME" in the last 168 hours.   Radiology Studies: DG Abd 1 View  Result Date: 07/14/2022 CLINICAL DATA:  Feeding tube placement. EXAM: ABDOMEN - 1 VIEW COMPARISON:  07/10/2022 FINDINGS: The feeding tube tip overlies the body region of the stomach. The lung bases are grossly clear. No free air is identified. IMPRESSION: Feeding tube tip overlies the body region of the stomach. Electronically Signed   By: P.  Gallerani M.D.   On: 07/14/2022 09:30       Hanni Milford M.D. [facility] 07/14/2022, 3:18 PM  Available via Epic secure chat 7am-7pm After 7 pm, please refer to night coverage provider listed on amion.    

## 2022-07-15 ENCOUNTER — Inpatient Hospital Stay (HOSPITAL_COMMUNITY): Payer: HMO

## 2022-07-15 DIAGNOSIS — Z930 Tracheostomy status: Secondary | ICD-10-CM | POA: Diagnosis not present

## 2022-07-15 DIAGNOSIS — I10 Essential (primary) hypertension: Secondary | ICD-10-CM | POA: Diagnosis not present

## 2022-07-15 DIAGNOSIS — F0390 Unspecified dementia without behavioral disturbance: Secondary | ICD-10-CM | POA: Diagnosis not present

## 2022-07-15 DIAGNOSIS — G9340 Encephalopathy, unspecified: Secondary | ICD-10-CM | POA: Diagnosis not present

## 2022-07-15 DIAGNOSIS — J9601 Acute respiratory failure with hypoxia: Secondary | ICD-10-CM | POA: Diagnosis not present

## 2022-07-15 DIAGNOSIS — J9602 Acute respiratory failure with hypercapnia: Secondary | ICD-10-CM | POA: Diagnosis not present

## 2022-07-15 DIAGNOSIS — N179 Acute kidney failure, unspecified: Secondary | ICD-10-CM | POA: Diagnosis not present

## 2022-07-15 LAB — CBC
HCT: 39.4 % (ref 36.0–46.0)
Hemoglobin: 12.5 g/dL (ref 12.0–15.0)
MCHC: 31.7 g/dL (ref 30.0–36.0)
MCV: 96.1 fL (ref 80.0–100.0)
Platelets: 368 10*3/uL (ref 150–400)

## 2022-07-15 LAB — BASIC METABOLIC PANEL
Chloride: 105 mmol/L (ref 98–111)
Creatinine, Ser: 0.78 mg/dL (ref 0.44–1.00)
Sodium: 137 mmol/L (ref 135–145)

## 2022-07-15 LAB — GLUCOSE, CAPILLARY
Glucose-Capillary: 118 mg/dL — ABNORMAL HIGH (ref 70–99)
Glucose-Capillary: 124 mg/dL — ABNORMAL HIGH (ref 70–99)
Glucose-Capillary: 124 mg/dL — ABNORMAL HIGH (ref 70–99)
Glucose-Capillary: 139 mg/dL — ABNORMAL HIGH (ref 70–99)

## 2022-07-15 MED ORDER — JUVEN PO PACK
1.0000 | PACK | Freq: Two times a day (BID) | ORAL | Status: DC
  Administered 2022-07-16 – 2022-08-29 (×57): 1 via ORAL
  Filled 2022-07-15 (×57): qty 1

## 2022-07-15 MED ORDER — ENSURE ENLIVE PO LIQD
237.0000 mL | Freq: Two times a day (BID) | ORAL | Status: DC
  Administered 2022-07-16 – 2022-08-10 (×33): 237 mL via ORAL

## 2022-07-15 MED ORDER — POTASSIUM CHLORIDE CRYS ER 20 MEQ PO TBCR
40.0000 meq | EXTENDED_RELEASE_TABLET | Freq: Once | ORAL | Status: AC
  Administered 2022-07-15: 40 meq via ORAL
  Filled 2022-07-15: qty 2

## 2022-07-15 MED ORDER — AMLODIPINE BESYLATE 5 MG PO TABS
5.0000 mg | ORAL_TABLET | Freq: Every day | ORAL | Status: DC
  Administered 2022-07-15 – 2022-08-29 (×40): 5 mg via ORAL
  Filled 2022-07-15 (×45): qty 1

## 2022-07-15 NOTE — Progress Notes (Addendum)
Modified Barium Swallow Study  Patient Details  Name: Carla [name] MRN: [id number] Date of Birth: 06/23/1945  Today's Date: 07/15/2022  Modified Barium Swallow completed.  Full report located under Chart Review in the Imaging Section.  History of Present Illness Carla [name] is a 77 yo female who was found by family unresponsive and brought to ER 06/12/22.  Found to have acute on chronic hypercapnia on ABG.  Intubated in ER and unable to wean from vent.  Initial plans for one way extubation, but with change in goals. Tracheostomy placed 4/8.  Difficulty weaning from vent, but tolerating trach collar for 48 hours as of 4/18. Pt with with hx COPD from emphysema, tobacco abuse, peripheral vascular disease, hypertension, prediabetes, anxiety, baseline dementia.   Clinical Impression Pt was reluctant to eat or drink during exam; she required constant encouragement to participate. Therefore, only limited sips of thin liquid and one bite of puree was tested.  PMV was in place for exam. Pt demonstrated reliable airway protection with all swallows with no penetration/no aspiration. She transferred solids from mouth through UES with only trace residue lining base of tongue/posterior pharyngeal walls post-swallow.  Despite paucity of test boluses, there appears to be no reason to delay starting an oral diet. For now, recommend beginning a dysphagia 1 diet/thin liquids; crush meds when able.  Pt must use PMV during all PO intake. D/W RN and posted sign over HOB. SLP will follow. Factors that may increase risk of adverse event in presence of aspiration (Palmer & Padilla 2021): Frail or deconditioned;Reduced cognitive function  Swallow Evaluation Recommendations Recommendations: PO diet PO Diet Recommendation: Dysphagia 1 (Pureed);Thin liquids (Level 0) Liquid Administration via: Cup;Straw Medication Administration: Crushed with puree Supervision: Full supervision/cueing for swallowing  strategies Swallowing strategies  : Place PMSV during PO intake Oral care recommendations: Oral care BID (2x/day)    Carla Rennels L. Trystan Eads, MA CCC/SLP Clinical Specialist - Acute Care SLP Acute Rehabilitation Services Office number [phone]   Carla [name] 07/15/2022,1:36 PM 

## 2022-07-15 NOTE — Progress Notes (Incomplete Revision)
Modified Barium Swallow Study  Patient Details  Name: Carla [name] MRN: [id number] Date of Birth: 06/23/1945  Today's Date: 07/15/2022  Modified Barium Swallow completed.  Full report located under Chart Review in the Imaging Section.  History of Present Illness Carla [name] is a 77 yo female who was found by family unresponsive and brought to ER 06/12/22.  Found to have acute on chronic hypercapnia on ABG.  Intubated in ER and unable to wean from vent.  Initial plans for one way extubation, but with change in goals. Tracheostomy placed 4/8.  Difficulty weaning from vent, but tolerating trach collar for 48 hours as of 4/18. Pt with with hx COPD from emphysema, tobacco abuse, peripheral vascular disease, hypertension, prediabetes, anxiety, baseline dementia.   Clinical Impression Pt was reluctant to eat or drink during exam; she required constant encouragement to participate. Therefore, only limited sips of thin liquid and one bite of puree was tested.  PMV was in place for exam. Pt demonstrated reliable airway protection with all swallows with no penetration/no aspiration. She transferred solids from mouth through UES with only trace residue lining base of tongue/posterior pharyngeal walls post-swallow.  Despite paucity of test boluses, there appears to be no reason to delay starting an oral diet. For now, recommend beginning a dysphagia 1 diet/thin liquids; crush meds when able.  Pt must use PMV during all PO intake. D/W RN and posted sign over HOB. SLP will follow. Factors that may increase risk of adverse event in presence of aspiration (Palmer & Padilla 2021): Frail or deconditioned;Reduced cognitive function  Swallow Evaluation Recommendations Recommendations: PO diet PO Diet Recommendation: Dysphagia 1 (Pureed);Thin liquids (Level 0) Liquid Administration via: Cup;Straw Medication Administration: Crushed with puree Supervision: Full supervision/cueing for swallowing  strategies Swallowing strategies  : Place PMSV during PO intake Oral care recommendations: Oral care BID (2x/day)    Kuper Rennels L. Trystan Eads, MA CCC/SLP Clinical Specialist - Acute Care SLP Acute Rehabilitation Services Office number [phone]   Kateryna Grantham Laurice 07/15/2022,1:36 PM 

## 2022-07-15 NOTE — Progress Notes (Addendum)
   [facility]                                                                               Carla [name], is a 77 y.o. female, DOB - 05/08/1945, MRN:[id number] Admit date - 06/12/2022    Outpatient Primary MD for the patient is Patient, [name]  LOS - 33  days    Brief summary   77 yo female smoker with hx of COPD from emphysema, Hypertension, Dementia, Anxiety, Depression, GERD, Primary hyperparathyroidism, Vit D deficiency, HLD, Osteoporosis , found by family unresponsive and brought to ER. Found to have acute on chronic hypercapnia on ABG. Intubated in ER. She was started on therapy for COPD exacerbation. PCCM consulted to assist with respiratory management.    Significant Events  3/20 Admitted after being found unresponsive, intubated on ED arrival  CT angio chest >> atherosclerosis, severe coronary calcification, severe LVH, mild centrilobular emphysema, 2 mm nodule LUL, 4 mm nodule LUL Blood culture  >> Haemophilus influenzae 3/22 Had oxygen desaturation and low Vt with SBT this morning. Echo 06/14/22 >> EF 60 to 65%, mod LVH, grade 1 DD, mild/mod AR 3/25 bronch for peak pressures, low tidal volumes with mucus cast at end of ETT, thick secretions caking tube 3/29 Bronched again with purulent secretions LLL with plugging and LLL atelectasis, some increase in O2 requirement on vent, zosyn started for VAP coverage. MRSA nare swab today negative.  3/30 Remains intubated with waxing and weaning agitation on fentanyl and precedex  3/31 Remains on vent with continued issues with agitation when sedation lightened  4/2 family meeting, made DNR, plans for one way extubation 4/5 4/5-no overnight events, plan is for one-way extubation today 4/6-family changes mind regarding one-way extubation, plan will be for tracheostomy 4/8 trach placed 4/10 failed overnight weaning trial, back on PRVC and sedation  was added. Subsequently sedation weaned and enteral agents were dc and or  decreased.  4/11 failed psv w some distress. Fever. Abx added 4/12 cont efforts at psv. Most awake she has been in days   4/13 Tolerating PS.  4/15 Has been on ATC x 1:45 minutes, RR 40 and accessory muscle use, placing back on vent 4/18 Cortrak clogged and thus replaced. Still working on weaning completed CTx for PNA. 48 hrs on ATC  4/19 off vent x 72 hours so removed from room.  Planning on changing trach to cuffless.  Continuing to work with PMV.  Hypoglycemia adding D5 to maintenance fluids 4/20 transfer out of ICU  4/21 transfer to [facility]. Agitated on restraints.   Assessment & Plan    Assessment and Plan:    * Acute respiratory failure with hypoxia and hypercapnia S/p intubation on admission.  s/p tracheostomy , changed to trach collar and cuffless on 4/19.  Continue with brovana, pulmicort and  yupelri.  Pt is more calmer this morning. On mittens    Acute metabolic encephalopathy With baseline Dementia.   Presented with unresponsiveness.  Head CT negative for acute abnormality.  Likely due to hypercarbic respiratory failure and ICU delirium.  Pulled out cortrak  Type 2 DM:  CBG (last 3)  Recent Labs    07/15/22 0430   07/15/22 0827 07/15/22 1251  GLUCAP 124* 139* 118*    Continue with SSI.    AKI;  Resolved.   Pulmonary nodules Incidental finding on CT 06/12/2022 - 2 mm and 4 mm noncalcified left upper lobe lung nodules. No follow-up needed if patient is low-risk (and has no known or suspected primary neoplasm). Non-contrast chest CT can be considered in 12 months if patient is high-risk.  -Follow-up as outpatient  HTN (hypertension) Not well controlled. Will add prn hydralazine.    Nutrition:  SLP on board, MBS DONE,  and recommended pureed diet.       RN Pressure Injury Documentation: Pressure Injury 07/10/22 Nare Right;Medial Stage 2 -  Partial thickness loss of dermis presenting as a shallow open injury with a red, pink wound bed without slough.  small, pink/red area due to bridle from Cortrak (Active)  07/10/22 0954  Location: Nare  Location Orientation: Right;Medial  Staging: Stage 2 -  Partial thickness loss of dermis presenting as a shallow open injury with a red, pink wound bed without slough.  Wound Description (Comments): small, pink/red area due to bridle from Cortrak  Present on Admission: No  Dressing Type None 07/15/22 0855    Malnutrition Type:  Nutrition Problem: Severe Malnutrition Etiology: chronic illness (COPD, dementia)   Malnutrition Characteristics:  Signs/Symptoms: severe fat depletion, severe muscle depletion   Nutrition Interventions:  Interventions: Tube feeding  Estimated body mass index is 18.75 kg/m as calculated from the following:   Height as of this encounter: 5' (1.524 m).   Weight as of this encounter: 43.5 kg.  Code Status: DNR DVT Prophylaxis:  enoxaparin (LOVENOX) injection 30 mg Start: 07/02/22 1000   Level of Care: Level of care: Progressive Family Communication: none at bedside.   Disposition Plan:     Remains inpatient appropriate:  s/p trach . Procedures:  S/P trach.   Consultants:   Palliative care PCCM.   Antimicrobials:   Anti-infectives (From admission, onward)    Start     Dose/Rate Route Frequency Ordered Stop   07/06/22 1000  cefTRIAXone (ROCEPHIN) 2 g in sodium chloride 0.9 % 100 mL IVPB        2 g 200 mL/hr over 30 Minutes Intravenous Every 24 hours 07/06/22 0848 07/08/22 0952   07/05/22 0200  piperacillin-tazobactam (ZOSYN) IVPB 3.375 g  Status:  Discontinued        3.375 g 12.5 mL/hr over 240 Minutes Intravenous Every 8 hours 07/04/22 1826 07/06/22 0848   07/04/22 1915  vancomycin (VANCOCIN) IVPB 1000 mg/200 mL premix  Status:  Discontinued        1,000 mg 200 mL/hr over 60 Minutes Intravenous Every 48 hours 07/04/22 1826 07/05/22 0827   07/04/22 1915  piperacillin-tazobactam (ZOSYN) IVPB 3.375 g        3.375 g 100 mL/hr over 30 Minutes Intravenous   Once 07/04/22 1826 07/04/22 1914   06/21/22 2300  piperacillin-tazobactam (ZOSYN) IVPB 3.375 g        3.375 g 12.5 mL/hr over 240 Minutes Intravenous Every 8 hours 06/21/22 1631 06/29/22 2009   06/21/22 1645  piperacillin-tazobactam (ZOSYN) IVPB 3.375 g  Status:  Discontinued        3.375 g 12.5 mL/hr over 240 Minutes Intravenous Every 8 hours 06/21/22 1630 06/21/22 1631   06/21/22 1645  piperacillin-tazobactam (ZOSYN) IVPB 3.375 g        3.375 g 100 mL/hr over 30 Minutes Intravenous STAT 06/21/22 1631 06/21/22 1900   06/14/22 1315  cefTRIAXone (ROCEPHIN)   2 g in sodium chloride 0.9 % 100 mL IVPB  Status:  Discontinued        2 g 200 mL/hr over 30 Minutes Intravenous Every 24 hours 06/14/22 1249 06/20/22 1415   06/12/22 2230  doxycycline (VIBRAMYCIN) 100 mg in sodium chloride 0.9 % 250 mL IVPB  Status:  Discontinued        100 mg 125 mL/hr over 120 Minutes Intravenous Every 12 hours 06/12/22 2139 06/14/22 1350        Medications  Scheduled Meds:  arformoterol  15 mcg Nebulization BID   budesonide (PULMICORT) nebulizer solution  0.5 mg Nebulization BID   Chlorhexidine Gluconate Cloth  6 each Topical Daily   clonazePAM  0.5 mg Per Tube QHS   clonazePAM  0.5 mg Per Tube Daily   enoxaparin (LOVENOX) injection  30 mg Subcutaneous Daily   feeding supplement (PROSource TF20)  60 mL Per Tube Daily   fiber  1 packet Per Tube Daily   nutrition supplement (JUVEN)  1 packet Per Tube BID BM   OLANZapine  5 mg Intravenous Once   mouth rinse  15 mL Mouth Rinse Q2H   pantoprazole (PROTONIX) IV  40 mg Intravenous Daily   revefenacin  175 mcg Nebulization Daily   sodium chloride flush  10-40 mL Intracatheter Q12H   Continuous Infusions:  sodium chloride 10 mL/hr at 07/01/22 0503   sodium chloride 250 mL (07/04/22 1842)   dextrose 5 % and 0.45% NaCl 75 mL/hr at 07/15/22 0726   feeding supplement (OSMOLITE 1.5 CAL) Stopped (07/14/22 2100)   PRN Meds:.acetaminophen **OR** acetaminophen,  albuterol, hydrALAZINE, lip balm, midazolam, ondansetron **OR** ondansetron (ZOFRAN) IV, mouth rinse, oxyCODONE, polyethylene glycol, senna, sodium chloride flush    Subjective:   Carla [name] was seen and examined today.  She pulled the cortrak last night.   Objective:   Vitals:   07/15/22 0745 07/15/22 0824 07/15/22 1110 07/15/22 1248  BP:  (!) 173/93  (!) 180/80  Pulse: (!) 104 (!) 105 (!) 110 97  Resp: (!) 22 (!) 25 (!) 24 20  Temp:  (!) 97.4 F (36.3 C)  97.8 F (36.6 C)  TempSrc:  Oral  Oral  SpO2: 98% 97% 100% 98%  Weight:      Height:        Intake/Output Summary (Last 24 hours) at 07/15/2022 1355 Last data filed at 07/15/2022 0400 Gross per 24 hour  Intake 1201.67 ml  Output 1260 ml  Net -58.33 ml    Filed Weights   07/13/22 0354 07/14/22 0500 07/15/22 0432  Weight: 41.1 kg 43.5 kg 43.5 kg     Exam General exam: ill appearing but not in distress on s/p trach Respiratory system: Clear to auscultation. Respiratory effort normal. Cardiovascular system: S1 & S2 heard, RRR. No JVD,  Gastrointestinal system: Abdomen is nondistended, soft  Central nervous system: Alert and confused.  Extremities: no cyanosis.  Skin: No rashes, Psychiatry: unable to assess.     Data Reviewed:  I have personally reviewed following labs and imaging studies   CBC Lab Results  Component Value Date   WBC 10.3 07/15/2022   RBC 4.10 07/15/2022   HGB 12.5 07/15/2022   HCT 39.4 07/15/2022   MCV 96.1 07/15/2022   MCH 30.5 07/15/2022   PLT 368 07/15/2022   MCHC 31.7 07/15/2022   RDW 16.2 (H) 07/15/2022   LYMPHSABS 1.1 06/27/2022   MONOABS 0.7 06/27/2022   EOSABS 0.2 06/27/2022   BASOSABS 0.0 06/27/2022       Last metabolic panel Lab Results  Component Value Date   NA 137 07/15/2022   K 3.4 (L) 07/15/2022   CL 105 07/15/2022   CO2 24 07/15/2022   BUN 18 07/15/2022   CREATININE 0.78 07/15/2022   GLUCOSE 137 (H) 07/15/2022   GFRNONAA >60 07/15/2022   CALCIUM 9.8  07/15/2022   PHOS 2.6 07/08/2022   PROT 7.8 06/12/2022   ALBUMIN 1.8 (L) 06/26/2022   BILITOT 0.6 06/12/2022   ALKPHOS 120 06/12/2022   AST 47 (H) 06/12/2022   ALT 18 06/12/2022   ANIONGAP 8 07/15/2022    CBG (last 3)  Recent Labs    07/15/22 0430 07/15/22 0827 07/15/22 1251  GLUCAP 124* 139* 118*       Coagulation Profile: No results for input(s): "INR", "PROTIME" in the last 168 hours.   Radiology Studies: DG Swallowing Func-Speech Pathology  Result Date: 07/15/2022 Modified Barium Swallow Study Patient Details Name: Carla [name] MRN: [id number] Date of Birth: 12/30/1945 Today's Date: 07/15/2022 HPI/PMH: HPI: Carla [name] is a 77 yo female who was found by family unresponsive and brought to ER 06/12/22.  Found to have acute on chronic hypercapnia on ABG.  Intubated in ER and unable to wean from vent.  Initial plans for one way extubation, but with change in goals. Tracheostomy placed 4/8.  Difficulty weaning from vent, but tolerating trach collar for 48 hours as of 4/18. Pt with with hx COPD from emphysema, tobacco abuse, peripheral vascular disease, hypertension, prediabetes, anxiety, baseline dementia. Clinical Impression: Clinical Impression: Pt was reluctant to eat or drink during exam; she required constant encouragement to participate. Therefore, only limited sips of thin liquid and one bite of puree was tested.  PMV was in place for exam. Pt demonstrated reliable airway protection with all swallows with no penetration/no aspiration. She transferred solids from mouth through UES with only trace residue lining base of tongue/posterior pharyngeal walls post-swallow.  Despite paucity of test boluses, there appears to be no reason to delay starting an oral diet. For now, recommend beginning a dysphagia 1 diet/thin liquids; crush meds when able.  Pt must use PMV during all PO intake. D/W RN and posted sign over HOB. SLP will follow. Factors that may increase risk of adverse event in  presence of aspiration (Palmer & Padilla 2021): Factors that may increase risk of adverse event in presence of aspiration (Palmer & Padilla 2021): Frail or deconditioned; Reduced cognitive function Recommendations/Plan: Swallowing Evaluation Recommendations Swallowing Evaluation Recommendations Recommendations: PO diet PO Diet Recommendation: Dysphagia 1 (Pureed); Thin liquids (Level 0) Liquid Administration via: Cup; Straw Medication Administration: Crushed with puree Supervision: Full supervision/cueing for swallowing strategies Swallowing strategies  : Place PMSV during PO intake Oral care recommendations: Oral care BID (2x/day) Treatment Plan Treatment Plan Treatment recommendations: Therapy as outlined in treatment plan below Follow-up recommendations: Skilled nursing-short term rehab (<3 hours/day) Functional status assessment: Patient has had a recent decline in their functional status and demonstrates the ability to make significant improvements in function in a reasonable and predictable amount of time. Treatment frequency: Min 2x/week Treatment duration: 1 week Interventions: Trials of upgraded texture/liquids; Patient/family education; Diet toleration management by SLP Recommendations Recommendations for follow up therapy are one component of a multi-disciplinary discharge planning process, led by the attending physician.  Recommendations may be updated based on patient status, additional functional criteria and insurance authorization. Assessment: Orofacial Exam: Orofacial Exam Oral Cavity - Dentition: Missing dentition Anatomy: No data recorded Boluses Administered: Boluses Administered Boluses Administered: Thin liquids (  Level 0); Puree  Oral Impairment Domain: Oral Impairment Domain Lip Closure: No labial escape Tongue control during bolus hold: Not tested Bolus preparation/mastication: -- (NT) Bolus transport/lingual motion: Brisk tongue motion Oral residue: Trace residue lining oral structures  Location of oral residue : Floor of mouth; Tongue Initiation of pharyngeal swallow : Valleculae  Pharyngeal Impairment Domain: Pharyngeal Impairment Domain Soft palate elevation: No bolus between soft palate (SP)/pharyngeal wall (PW) Laryngeal elevation: Complete superior movement of thyroid cartilage with complete approximation of arytenoids to epiglottic petiole Anterior hyoid excursion: Complete anterior movement Epiglottic movement: Complete inversion Laryngeal vestibule closure: Incomplete, narrow column air/contrast in laryngeal vestibule Pharyngeal stripping wave : Present - complete Pharyngeal contraction (A/P view only): N/A Pharyngoesophageal segment opening: Partial distention/partial duration, partial obstruction of flow Tongue base retraction: Trace column of contrast or air between tongue base and PPW Pharyngeal residue: Trace residue within or on pharyngeal structures Location of pharyngeal residue: Valleculae  Esophageal Impairment Domain: No data recorded Pill: No data recorded Penetration/Aspiration Scale Score: Penetration/Aspiration Scale Score 1.  Material does not enter airway: Thin liquids (Level 0); Puree Compensatory Strategies: Compensatory Strategies Compensatory strategies: Yes Straw: Effective   General Information: Caregiver present: No  Diet Prior to this Study: NPO   Temperature : Normal   No data recorded  Supplemental O2: Trach   History of Recent Intubation: Yes  Behavior/Cognition: Alert; Confused; Requires cueing Self-Feeding Abilities: Needs assist with self-feeding Baseline vocal quality/speech: Normal Volitional Cough: Able to elicit Volitional Swallow: Able to elicit Exam Limitations: Poor participation; Poor bolus acceptance Goal Planning: Prognosis for improved oropharyngeal function: Fair Barriers to Reach Goals: Cognitive deficits No data recorded Patient/Family Stated Goal: unable to state Consulted and agree with results and recommendations: Nurse Pain: Pain Assessment  Pain Assessment: Faces Faces Pain Scale: 0 Facial Expression: 0 Body Movements: 0 Muscle Tension: 0 Compliance with ventilator (intubated pts.): N/A Vocalization (extubated pts.): N/A CPOT Total: 0 Pain Location: generalized, restless Pain Descriptors / Indicators: Discomfort; Guarding Pain Intervention(s): Limited activity within patient's tolerance; Monitored during session; Repositioned End of Session: Start Time:SLP Start Time (ACUTE ONLY): 1205 Stop Time: SLP Stop Time (ACUTE ONLY): 1227 Time Calculation:SLP Time Calculation (min) (ACUTE ONLY): 22 min Charges: SLP Evaluations $ SLP Speech Visit: 1 Visit SLP Evaluations $BSS Swallow: 1 Procedure $MBS Swallow: 1 Procedure $$ Passy Muir Speaking Valve: yes $Speech Treatment for Individual: 1 Procedure SLP visit diagnosis: SLP Visit Diagnosis: Dysphagia, oropharyngeal phase (R13.12) Past Medical History: No past medical history on file. Past Surgical History: Couture, Amanda Laurice 07/15/2022, 1:37 PM  DG Abd 1 View  Result Date: 07/14/2022 CLINICAL DATA:  Feeding tube placement. EXAM: ABDOMEN - 1 VIEW COMPARISON:  07/10/2022 FINDINGS: The feeding tube tip overlies the body region of the stomach. The lung bases are grossly clear. No free air is identified. IMPRESSION: Feeding tube tip overlies the body region of the stomach. Electronically Signed   By: P.  Gallerani M.D.   On: 07/14/2022 09:30       Iva Montelongo M.D. [facility] 07/15/2022, 1:55 PM  Available via Epic secure chat 7am-7pm After 7 pm, please refer to night coverage provider listed on amion.    

## 2022-07-15 NOTE — Progress Notes (Addendum)
NAME:  Carla [name], MRN:  [id number], DOB:  11-Sep-1945, LOS: 33 ADMISSION DATE:  06/12/2022, CONSULTATION DATE:  06/13/2022 REFERRING MD: Sherryll Burger - [facility] CHIEF COMPLAINT: SOB  History of Present Illness:   77 yo female smoker with hx of COPD from emphysema found by family unresponsive and brought to ER.  Found to have acute on chronic hypercapnia on ABG.  Intubated in ER.  She was started on therapy for COPD exacerbation.  PCCM consulted to assist with respiratory management.  Pertinent Medical History:   Hypertension, Dementia, Anxiety, Depression, GERD, Primary hyperparathyroidism, Vit D deficiency, HLD, Osteoporosis  Significant Hospital Events: Including procedures, antibiotic start and stop dates in addition to other pertinent events   3/20 Admitted after being found unresponsive, intubated on ED arrival  CT angio chest >> atherosclerosis, severe coronary calcification, severe LVH, mild centrilobular emphysema, 2 mm nodule LUL, 4 mm nodule LUL Blood culture  >> Haemophilus influenzae 3/22 Had oxygen desaturation and low Vt with SBT this morning. Echo 06/14/22 >> EF 60 to 65%, mod LVH, grade 1 DD, mild/mod AR 3/25 bronch for peak pressures, low tidal volumes with mucus cast at end of ETT, thick secretions caking tube 3/29 Bronched again with purulent secretions LLL with plugging and LLL atelectasis, some increase in O2 requirement on vent, zosyn started for VAP coverage. MRSA nare swab today negative.  3/30 Remains intubated with waxing and weaning agitation on fentanyl and precedex  3/31 Remains on vent with continued issues with agitation when sedation lightened  4/2 family meeting, made DNR, plans for one way extubation 4/5 4/5-no overnight events, plan is for one-way extubation today 4/6-family changes mind regarding one-way extubation, plan will be for tracheostomy 4/8 trach placed 4/10 failed overnight weaning trial, back on PRVC and sedation  was added. Subsequently sedation  weaned and enteral agents were dc and or decreased.  4/11 failed psv w some distress. Fever. Abx added 4/12 cont efforts at psv. Most awake she has been in days   4/13 Tolerating PS.  4/15 Has been on ATC x 1:45 minutes, RR 40 and accessory muscle use, placing back on vent 4/18 Cortrak clogged and thus replaced. Still working on weaning completed CTx for PNA. 48 hrs on ATC  4/19 off vent x 72 hours so removed from room.  Planning on changing trach to cuffless.  Continuing to work with PMV.  Hypoglycemia adding D5 to maintenance fluids 4/20 transfer out of ICU 4/21 Pulled out Cortrak overnight. 4/22 Remains delirious, tolerating ATC, secretions not significant, not using PMV.  Interim History / Subjective:  Pleasant today, though seems a bit confused Pulled out Cortrak overnight Does not remember using/trying PMV, though this is present in room/signage present Still c/o some SOB, but this feels better Coughing less, thin secretions present but not significant Asking about her teeth today, I do not see a denture case in the room  Objective:  Blood pressure (!) 173/93, pulse (!) 105, temperature (!) 97.4 F (36.3 C), temperature source Oral, resp. rate (!) 25, height 5' (1.524 m), weight 43.5 kg, SpO2 97 %.    FiO2 (%):  [28 %] 28 %   Intake/Output Summary (Last 24 hours) at 07/15/2022 0924 Last data filed at 07/15/2022 0400 Gross per 24 hour  Intake 2522.48 ml  Output 2260 ml  Net 262.48 ml    Filed Weights   07/13/22 0354 07/14/22 0500 07/15/22 0432  Weight: 41.1 kg 43.5 kg 43.5 kg   Physical Examination: General: Chronically ill-appearing elderly  woman, frail/debilatated, in NAD. HEENT: [place]/AT, anicteric sclera, PERRL, moist mucous membranes. Neck: Tracheostomy present, midline, no erythema. Thin clear-white secretions from NTS. Neuro:  Awake, difficult to assess orientation but oriented to self. Seems confused.  Responds to verbal stimuli. Following commands consistently. Moves  all 4 extremities spontaneously.  CV: Mildly tachycardic, regular rhythm, no m/g/r. PULM: Breathing even and unlabored on ATC. Lung fields CTAB, slightly diminished at bases. GI: Soft, nontender, nondistended. Extremities: No LE edema noted. Skin: Warm/dry, no rashes.  Resolved Hospital Problem List:  H Flu bacteremia Septic shock due to H Flu bacteremia, H Flu PNA  Contraction alkalosis   Assessment & Plan:  Acute encephalopathy superimposed on underlying dementia Hx Anxiety ICU delirium  Weaning CNS depressing meds -- have made a lot of progress w BZD and opiates as well as atypicals - Limit CNS-depressing medications as able - Delirium precautions - Normalize sleep/wake cycle as able - Limit tethers, this has been difficult due to anxiety/agitation  AoC resp failure w hypoxia and hypercarbia Hflu PNA S/p tracheostomy COPD Tobacco use  On trach collar for several days straight, trach changed to cuffless 4/19. - Continue ATC as tolerated - Trach care (cuffless) per protocol - Bronchodilators (Brovana/Yupelri, Pulmicort) - Pulmonary hygiene - Encourage OOB, mobility with therapies (PT/OT) - Continue SLP, PMV eval/use  HTN - Cardiac monitoring - Medication management per primary team  AKI Hypernatremia --> iatrogenic hyponatremia, mild  Urinary retention  - Foley for urinary retention - FWF as indicated - Trend BMP  Inadequate PO intake  Constipation  - Hopeful to avoid PEG if mentation improves/can participate in SLP, swallow improvement - FEES/MBSS when better able to participate with speech therapy - Cortrak inadvertently pulled out 4/21PM - Bowel regimen  DM with hypoglycemia  - CBGs Q4H - Goal CBG 140-180  Best Practice right click and "Reselect all SmartList Selections" daily)   Diet/type: tubefeeds  DVT prophylaxis: LMWH GI prophylaxis: PPI Lines: Central line -- LUE PICC  Foley:  Yes, and it is still needed Code Status:  DNR  Signature:    Tim Lair, PA-C [facility] 07/15/22 9:24 AM  Please see [email or web address] for pager details.  From 7A-7P if no response, please call [phone] After hours, please call ELink [phone]

## 2022-07-16 LAB — BASIC METABOLIC PANEL
Anion gap: 9 (ref 5–15)
BUN: 15 mg/dL (ref 8–23)
CO2: 24 mmol/L (ref 22–32)
Creatinine, Ser: 0.74 mg/dL (ref 0.44–1.00)
GFR, Estimated: >60 mL/min (ref >60)

## 2022-07-16 MED ORDER — CARVEDILOL 3.125 MG PO TABS
3.1250 mg | ORAL_TABLET | Freq: Two times a day (BID) | ORAL | Status: DC
  Administered 2022-07-16 – 2022-08-18 (×52): 3.125 mg via ORAL
  Filled 2022-07-16 (×59): qty 1

## 2022-07-16 MED ORDER — PANTOPRAZOLE SODIUM 40 MG PO TBEC
40.0000 mg | DELAYED_RELEASE_TABLET | Freq: Every day | ORAL | Status: DC
  Administered 2022-07-16 – 2022-08-29 (×40): 40 mg via ORAL
  Filled 2022-07-16 (×44): qty 1

## 2022-07-16 MED ORDER — HYDROXYZINE HCL 10 MG/5ML PO SYRP
10.0000 mg | ORAL_SOLUTION | Freq: Three times a day (TID) | ORAL | Status: DC | PRN
  Administered 2022-07-20 – 2022-07-26 (×2): 10 mg via ORAL
  Filled 2022-07-16 (×3): qty 5

## 2022-07-16 NOTE — Progress Notes (Addendum)
Speech Language Pathology Treatment: Passy Muir Speaking valve;Dysphagia  Patient Details Name: Carla [name] MRN: [id number] DOB: 09/06/1945 Today's Date: 07/16/2022 Time: 1031-1046 SLP Time Calculation (min) (ACUTE ONLY): 15 min  Assessment / Plan / Recommendation Clinical Impression  F/u after yesterday's MBS. Carla [name] has been wearing PMV more frequently today per RN.  She was repositioned to upright in the bed, PMV placed, and she communicated spontaneously throughout session.  She was pleasant, confused but redirectable, answered questions about preferences but overall with poor declarative memory (likely consistent with baseline upon chart review).  She repeatedly declined POs, but when straw from Ensure was presented, she readily drank several sips and by the end of the session nearly finished the container. There were no s/s of aspiration.  She tolerated valve with no changes in VS. Min verbal cues provided during session, primarily to sustain attention to drinking/verbalizing.  Recommend continued placement of PMV whenever staff is in the room.  She will require gentle encouragement to eat/drink. Recommend offering a sip of supplements when staff is present. SLP will follow.   HPI HPI: Carla [name] is a 77 yo female who was found by family unresponsive and brought to ER 06/12/22.  Found to have acute on chronic hypercapnia on ABG.  Intubated in ER and unable to wean from vent.  Initial plans for one way extubation, but with change in goals. Tracheostomy placed 4/8.  Difficulty weaning from vent, but tolerating trach collar for 48 hours as of 4/18. Pt with with hx COPD from emphysema, tobacco abuse, peripheral vascular disease, hypertension, prediabetes, anxiety, baseline dementia.      SLP Plan  Continue with current plan of care      Recommendations for follow up therapy are one component of a multi-disciplinary discharge planning process, led by the attending physician.   Recommendations may be updated based on patient status, additional functional criteria and insurance authorization.    Recommendations  Diet recommendations: Dysphagia 1 (puree);Thin liquid Liquids provided via: Cup;Straw Medication Administration: Crushed with puree Supervision: Staff to assist with self feeding Compensations: Minimize environmental distractions      Patient may use Passy-Muir Speech Valve: Intermittently with supervision MD: Please consider changing trach tube to : Cuffless           Oral care BID   Frequent or constant Supervision/Assistance Dysphagia, oropharyngeal phase (R13.12)     Continue with current plan of care    Carla [name] L. Carla Astorino, MA CCC/SLP Clinical Specialist - Acute Care SLP Acute Rehabilitation Services Office number [phone]  Carla [name] Carla [name]  07/16/2022, 11:00 AM 

## 2022-07-16 NOTE — Progress Notes (Addendum)
   [facility]                                                                               Carla [name], is a 77 y.o. female, DOB - 02/23/1946, MRN:[id number] Admit date - 06/12/2022    Outpatient Primary MD for the patient is Patient, [name]  LOS - 34  days    Brief summary   77 yo female smoker with hx of COPD from emphysema, Hypertension, Dementia, Anxiety, Depression, GERD, Primary hyperparathyroidism, Vit D deficiency, HLD, Osteoporosis , found by family unresponsive and brought to ER. Found to have acute on chronic hypercapnia on ABG. Intubated in ER. She was started on therapy for COPD exacerbation. PCCM consulted to assist with respiratory management.    Significant Events  3/20 Admitted after being found unresponsive, intubated on ED arrival  CT angio chest >> atherosclerosis, severe coronary calcification, severe LVH, mild centrilobular emphysema, 2 mm nodule LUL, 4 mm nodule LUL Blood culture  >> Haemophilus influenzae 3/22 Had oxygen desaturation and low Vt with SBT this morning. Echo 06/14/22 >> EF 60 to 65%, mod LVH, grade 1 DD, mild/mod AR 3/25 bronch for peak pressures, low tidal volumes with mucus cast at end of ETT, thick secretions caking tube 3/29 Bronched again with purulent secretions LLL with plugging and LLL atelectasis, some increase in O2 requirement on vent, zosyn started for VAP coverage. MRSA nare swab today negative.  3/30 Remains intubated with waxing and weaning agitation on fentanyl and precedex  3/31 Remains on vent with continued issues with agitation when sedation lightened  4/2 family meeting, made DNR, plans for one way extubation 4/5 4/5-no overnight events, plan is for one-way extubation today 4/6-family changes mind regarding one-way extubation, plan will be for tracheostomy 4/8 trach placed 4/10 failed overnight weaning trial, back on PRVC and sedation  was added. Subsequently sedation weaned and enteral agents were dc and or  decreased.  4/11 failed psv w some distress. Fever. Abx added 4/12 cont efforts at psv. Most awake she has been in days   4/13 Tolerating PS.  4/15 Has been on ATC x 1:45 minutes, RR 40 and accessory muscle use, placing back on vent 4/18 Cortrak clogged and thus replaced. Still working on weaning completed CTx for PNA. 48 hrs on ATC  4/19 off vent x 72 hours so removed from room.  Planning on changing trach to cuffless.  Continuing to work with PMV.  Hypoglycemia adding D5 to maintenance fluids 4/20 transfer out of ICU  4/21 transfer to [facility].   Assessment & Plan    Assessment and Plan:    * Acute respiratory failure with hypoxia and hypercapnia S/p intubation on admission.  s/p tracheostomy , changed to trach collar and cuffless on 4/19.  Continue with brovana, pulmicort and  yupelri.  She is more alert and on 5 lit of [place] oxygen.     Acute metabolic encephalopathy With baseline Dementia.   Presented with unresponsiveness.  Head CT negative for acute abnormality.  Likely due to hypercarbic respiratory failure and ICU delirium.  Pulled out cortrak on 4/22. Currently she is alert and oriented to person only. Able to answer some   simple questions.  Slp evaluation recommended dysphagia 1 diet with thin liquids.   Type 2 DM:  CBG (last 3)  Recent Labs    07/15/22 0430 07/15/22 0827 07/15/22 1251  GLUCAP 124* 139* 118*    Continue with SSI. No chagnes in meds.  Last A1c is 6 last month.    AKI;  Resolved.   Pulmonary nodules Incidental finding on CT 06/12/2022 - 2 mm and 4 mm noncalcified left upper lobe lung nodules. No follow-up needed if patient is low-risk (and has no known or suspected primary neoplasm). Non-contrast chest CT can be considered in 12 months if patient is high-risk.  -Follow-up as outpatient  HTN (hypertension) Better controlled today.  Added norvasc 5 mg daily, will add coreg 3.125 mg BID.     Chronic diastolic heart failure:  She appears  Euvolemic.  Continue to monitor.    Nutrition:  SLP on board, MBS DONE,  and recommended pureed/ dysphagia 1  diet.    Hypokalemia:  Replaced. Recheck later today.    RN Pressure Injury Documentation: Pressure Injury 07/10/22 Nare Right;Medial Stage 2 -  Partial thickness loss of dermis presenting as a shallow open injury with a red, pink wound bed without slough. small, pink/red area due to bridle from Cortrak (Active)  07/10/22 0954  Location: Nare  Location Orientation: Right;Medial  Staging: Stage 2 -  Partial thickness loss of dermis presenting as a shallow open injury with a red, pink wound bed without slough.  Wound Description (Comments): small, pink/red area due to bridle from Cortrak  Present on Admission: No  Dressing Type None 07/16/22 0850  Continue with local Wound care  Disposition:  TOC looking into LTAC.    Malnutrition Type:  Nutrition Problem: Severe Malnutrition Etiology: chronic illness (COPD, dementia)   Malnutrition Characteristics:  Signs/Symptoms: severe fat depletion, severe muscle depletion   Nutrition Interventions:  Interventions: Tube feeding  Estimated body mass index is 18.3 kg/m as calculated from the following:   Height as of this encounter: 5' (1.524 m).   Weight as of this encounter: 42.5 kg.  Code Status: DNR DVT Prophylaxis:  enoxaparin (LOVENOX) injection 30 mg Start: 07/02/22 1000   Level of Care: Level of care: Progressive Family Communication: none at bedside. Discussed the plan with son over the phone on 4/22.   Disposition Plan:     Remains inpatient appropriate:  TOC on board for LTAC, not a candidate for CIR  Procedures:  S/P trach.   Consultants:   Palliative care PCCM.   Antimicrobials:   Anti-infectives (From admission, onward)    Start     Dose/Rate Route Frequency Ordered Stop   07/06/22 1000  cefTRIAXone (ROCEPHIN) 2 g in sodium chloride 0.9 % 100 mL IVPB        2 g 200 mL/hr over 30 Minutes  Intravenous Every 24 hours 07/06/22 0848 07/08/22 0952   07/05/22 0200  piperacillin-tazobactam (ZOSYN) IVPB 3.375 g  Status:  Discontinued        3.375 g 12.5 mL/hr over 240 Minutes Intravenous Every 8 hours 07/04/22 1826 07/06/22 0848   07/04/22 1915  vancomycin (VANCOCIN) IVPB 1000 mg/200 mL premix  Status:  Discontinued        1,000 mg 200 mL/hr over 60 Minutes Intravenous Every 48 hours 07/04/22 1826 07/05/22 0827   07/04/22 1915  piperacillin-tazobactam (ZOSYN) IVPB 3.375 g        3.375 g 100 mL/hr over 30 Minutes Intravenous  Once 07/04/22 1826 07/04/22 1914     06/21/22 2300  piperacillin-tazobactam (ZOSYN) IVPB 3.375 g        3.375 g 12.5 mL/hr over 240 Minutes Intravenous Every 8 hours 06/21/22 1631 06/29/22 2009   06/21/22 1645  piperacillin-tazobactam (ZOSYN) IVPB 3.375 g  Status:  Discontinued        3.375 g 12.5 mL/hr over 240 Minutes Intravenous Every 8 hours 06/21/22 1630 06/21/22 1631   06/21/22 1645  piperacillin-tazobactam (ZOSYN) IVPB 3.375 g        3.375 g 100 mL/hr over 30 Minutes Intravenous STAT 06/21/22 1631 06/21/22 1900   06/14/22 1315  cefTRIAXone (ROCEPHIN) 2 g in sodium chloride 0.9 % 100 mL IVPB  Status:  Discontinued        2 g 200 mL/hr over 30 Minutes Intravenous Every 24 hours 06/14/22 1249 06/20/22 1415   06/12/22 2230  doxycycline (VIBRAMYCIN) 100 mg in sodium chloride 0.9 % 250 mL IVPB  Status:  Discontinued        100 mg 125 mL/hr over 120 Minutes Intravenous Every 12 hours 06/12/22 2139 06/14/22 1350        Medications  Scheduled Meds:  amLODipine  5 mg Oral Daily   arformoterol  15 mcg Nebulization BID   budesonide (PULMICORT) nebulizer solution  0.5 mg Nebulization BID   Chlorhexidine Gluconate Cloth  6 each Topical Daily   clonazePAM  0.5 mg Per Tube QHS   clonazePAM  0.5 mg Per Tube Daily   enoxaparin (LOVENOX) injection  30 mg Subcutaneous Daily   feeding supplement  237 mL Oral BID BM   nutrition supplement (JUVEN)  1 packet Oral  BID BM   OLANZapine  5 mg Intravenous Once   pantoprazole  40 mg Oral Daily   revefenacin  175 mcg Nebulization Daily   sodium chloride flush  10-40 mL Intracatheter Q12H   Continuous Infusions:  sodium chloride 10 mL/hr at 07/01/22 0503   sodium chloride 250 mL (07/04/22 1842)   dextrose 5 % and 0.45% NaCl 75 mL/hr at 07/16/22 1308   PRN Meds:.acetaminophen **OR** acetaminophen, albuterol, hydrALAZINE, lip balm, midazolam, ondansetron **OR** ondansetron (ZOFRAN) IV, mouth rinse, oxyCODONE, polyethylene glycol, senna, sodium chloride flush    Subjective:   Carla [name] was seen and examined today.  No events overnight.   Objective:   Vitals:   07/16/22 0819 07/16/22 1108 07/16/22 1135 07/16/22 1215  BP: (!) 165/88 (!) 162/69 (!) 154/75   Pulse: 92 (!) 106  (!) 101  Resp: (!) 22 (!) 26 (!) 22 (!) 22  Temp: 97.7 F (36.5 C) 98.1 F (36.7 C)    TempSrc: Axillary Axillary    SpO2: 100% 100%  98%  Weight:      Height:        Intake/Output Summary (Last 24 hours) at 07/16/2022 1349 Last data filed at 07/16/2022 1121 Gross per 24 hour  Intake 1788.17 ml  Output 2000 ml  Net -211.83 ml    Filed Weights   07/14/22 0500 07/15/22 0432 07/16/22 0431  Weight: 43.5 kg 43.5 kg 42.5 kg     Exam General exam: elderly lady, pleasantly confused and s/p trach.  Respiratory system: Clear to auscultation. Respiratory effort normal. Cardiovascular system: S1 & S2 heard, RRR. No JVD,  Gastrointestinal system: Abdomen is nondistended, soft and nontender.  Central nervous system: Alert and oriented to person only.  Extremities: no pedal edema.  Skin: No rashes, Psychiatry: calmer today.     Data Reviewed:  I have personally reviewed following labs and imaging studies     CBC Lab Results  Component Value Date   WBC 10.3 07/15/2022   RBC 4.10 07/15/2022   HGB 12.5 07/15/2022   HCT 39.4 07/15/2022   MCV 96.1 07/15/2022   MCH 30.5 07/15/2022   PLT 368 07/15/2022   MCHC 31.7  07/15/2022   RDW 16.2 (H) 07/15/2022   LYMPHSABS 1.1 06/27/2022   MONOABS 0.7 06/27/2022   EOSABS 0.2 06/27/2022   BASOSABS 0.0 06/27/2022     Last metabolic panel Lab Results  Component Value Date   NA 137 07/15/2022   K 3.4 (L) 07/15/2022   CL 105 07/15/2022   CO2 24 07/15/2022   BUN 18 07/15/2022   CREATININE 0.78 07/15/2022   GLUCOSE 137 (H) 07/15/2022   GFRNONAA >60 07/15/2022   CALCIUM 9.8 07/15/2022   PHOS 2.6 07/08/2022   PROT 7.8 06/12/2022   ALBUMIN 1.8 (L) 06/26/2022   BILITOT 0.6 06/12/2022   ALKPHOS 120 06/12/2022   AST 47 (H) 06/12/2022   ALT 18 06/12/2022   ANIONGAP 8 07/15/2022    CBG (last 3)  Recent Labs    07/15/22 0430 07/15/22 0827 07/15/22 1251  GLUCAP 124* 139* 118*       Coagulation Profile: No results for input(s): "INR", "PROTIME" in the last 168 hours.   Radiology Studies: DG Swallowing Func-Speech Pathology  Result Date: 07/15/2022 Modified Barium Swallow Study Patient Details Name: Carla [name] MRN: [id number] Date of Birth: 10/04/1945 Today's Date: 07/15/2022 HPI/PMH: HPI: Carla [name] is a 77 yo female who was found by family unresponsive and brought to ER 06/12/22.  Found to have acute on chronic hypercapnia on ABG.  Intubated in ER and unable to wean from vent.  Initial plans for one way extubation, but with change in goals. Tracheostomy placed 4/8.  Difficulty weaning from vent, but tolerating trach collar for 48 hours as of 4/18. Pt with with hx COPD from emphysema, tobacco abuse, peripheral vascular disease, hypertension, prediabetes, anxiety, baseline dementia. Clinical Impression: Clinical Impression: Pt was reluctant to eat or drink during exam; she required constant encouragement to participate. Therefore, only limited sips of thin liquid and one bite of puree was tested.  PMV was in place for exam. Pt demonstrated reliable airway protection with all swallows with no penetration/no aspiration. She transferred solids from mouth  through UES with only trace residue lining base of tongue/posterior pharyngeal walls post-swallow.  Despite paucity of test boluses, there appears to be no reason to delay starting an oral diet. For now, recommend beginning a dysphagia 1 diet/thin liquids; crush meds when able.  Pt must use PMV during all PO intake. D/W RN and posted sign over HOB. SLP will follow. Factors that may increase risk of adverse event in presence of aspiration (Palmer & Padilla 2021): Factors that may increase risk of adverse event in presence of aspiration (Palmer & Padilla 2021): Frail or deconditioned; Reduced cognitive function Recommendations/Plan: Swallowing Evaluation Recommendations Swallowing Evaluation Recommendations Recommendations: PO diet PO Diet Recommendation: Dysphagia 1 (Pureed); Thin liquids (Level 0) Liquid Administration via: Cup; Straw Medication Administration: Crushed with puree Supervision: Full supervision/cueing for swallowing strategies Swallowing strategies  : Place PMSV during PO intake Oral care recommendations: Oral care BID (2x/day) Treatment Plan Treatment Plan Treatment recommendations: Therapy as outlined in treatment plan below Follow-up recommendations: Skilled nursing-short term rehab (<3 hours/day) Functional status assessment: Patient has had a recent decline in their functional status and demonstrates the ability to make significant improvements in function in a reasonable and predictable amount of time. Treatment   frequency: Min 2x/week Treatment duration: 1 week Interventions: Trials of upgraded texture/liquids; Patient/family education; Diet toleration management by SLP Recommendations Recommendations for follow up therapy are one component of a multi-disciplinary discharge planning process, led by the attending physician.  Recommendations may be updated based on patient status, additional functional criteria and insurance authorization. Assessment: Orofacial Exam: Orofacial Exam Oral Cavity -  Dentition: Missing dentition Anatomy: No data recorded Boluses Administered: Boluses Administered Boluses Administered: Thin liquids (Level 0); Puree  Oral Impairment Domain: Oral Impairment Domain Lip Closure: No labial escape Tongue control during bolus hold: Not tested Bolus preparation/mastication: -- (NT) Bolus transport/lingual motion: Brisk tongue motion Oral residue: Trace residue lining oral structures Location of oral residue : Floor of mouth; Tongue Initiation of pharyngeal swallow : Valleculae  Pharyngeal Impairment Domain: Pharyngeal Impairment Domain Soft palate elevation: No bolus between soft palate (SP)/pharyngeal wall (PW) Laryngeal elevation: Complete superior movement of thyroid cartilage with complete approximation of arytenoids to epiglottic petiole Anterior hyoid excursion: Complete anterior movement Epiglottic movement: Complete inversion Laryngeal vestibule closure: Incomplete, narrow column air/contrast in laryngeal vestibule Pharyngeal stripping wave : Present - complete Pharyngeal contraction (A/P view only): N/A Pharyngoesophageal segment opening: Partial distention/partial duration, partial obstruction of flow Tongue base retraction: Trace column of contrast or air between tongue base and PPW Pharyngeal residue: Trace residue within or on pharyngeal structures Location of pharyngeal residue: Valleculae  Esophageal Impairment Domain: No data recorded Pill: No data recorded Penetration/Aspiration Scale Score: Penetration/Aspiration Scale Score 1.  Material does not enter airway: Thin liquids (Level 0); Puree Compensatory Strategies: Compensatory Strategies Compensatory strategies: Yes Straw: Effective   General Information: Caregiver present: No  Diet Prior to this Study: NPO   Temperature : Normal   No data recorded  Supplemental O2: Trach   History of Recent Intubation: Yes  Behavior/Cognition: Alert; Confused; Requires cueing Self-Feeding Abilities: Needs assist with self-feeding  Baseline vocal quality/speech: Normal Volitional Cough: Able to elicit Volitional Swallow: Able to elicit Exam Limitations: Poor participation; Poor bolus acceptance Goal Planning: Prognosis for improved oropharyngeal function: Fair Barriers to Reach Goals: Cognitive deficits No data recorded Patient/Family Stated Goal: unable to state Consulted and agree with results and recommendations: Nurse Pain: Pain Assessment Pain Assessment: Faces Faces Pain Scale: 0 Facial Expression: 0 Body Movements: 0 Muscle Tension: 0 Compliance with ventilator (intubated pts.): N/A Vocalization (extubated pts.): N/A CPOT Total: 0 Pain Location: generalized, restless Pain Descriptors / Indicators: Discomfort; Guarding Pain Intervention(s): Limited activity within patient's tolerance; Monitored during session; Repositioned End of Session: Start Time:SLP Start Time (ACUTE ONLY): 1205 Stop Time: SLP Stop Time (ACUTE ONLY): 1227 Time Calculation:SLP Time Calculation (min) (ACUTE ONLY): 22 min Charges: SLP Evaluations $ SLP Speech Visit: 1 Visit SLP Evaluations $BSS Swallow: 1 Procedure $MBS Swallow: 1 Procedure $$ Passy Muir Speaking Valve: yes $Speech Treatment for Individual: 1 Procedure SLP visit diagnosis: SLP Visit Diagnosis: Dysphagia, oropharyngeal phase (R13.12) Past Medical History: No past medical history on file. Past Surgical History: Couture, Amanda Laurice 07/15/2022, 1:37 PM      Carla [name] M.D. [facility] 07/16/2022, 1:49 PM  Available via Epic secure chat 7am-7pm After 7 pm, please refer to night coverage provider listed on amion.    

## 2022-07-16 NOTE — Progress Notes (Incomplete Revision)
Speech Language Pathology Treatment: Passy Muir Speaking valve;Dysphagia  Patient Details Name: Carla [name] MRN: [id number] DOB: 09/06/1945 Today's Date: 07/16/2022 Time: 1031-1046 SLP Time Calculation (min) (ACUTE ONLY): 15 min  Assessment / Plan / Recommendation Clinical Impression  F/u after yesterday's MBS. [name] has been wearing PMV more frequently today per RN.  She was repositioned to upright in the bed, PMV placed, and she communicated spontaneously throughout session.  She was pleasant, confused but redirectable, answered questions about preferences but overall with poor declarative memory (likely consistent with baseline upon chart review).  She repeatedly declined POs, but when straw from Ensure was presented, she readily drank several sips and by the end of the session nearly finished the container. There were no s/s of aspiration.  She tolerated valve with no changes in VS. Min verbal cues provided during session, primarily to sustain attention to drinking/verbalizing.  Recommend continued placement of PMV whenever staff is in the room.  She will require gentle encouragement to eat/drink. Recommend offering a sip of supplements when staff is present. SLP will follow.   HPI HPI: Carla [name] is a 77 yo female who was found by family unresponsive and brought to ER 06/12/22.  Found to have acute on chronic hypercapnia on ABG.  Intubated in ER and unable to wean from vent.  Initial plans for one way extubation, but with change in goals. Tracheostomy placed 4/8.  Difficulty weaning from vent, but tolerating trach collar for 48 hours as of 4/18. Pt with with hx COPD from emphysema, tobacco abuse, peripheral vascular disease, hypertension, prediabetes, anxiety, baseline dementia.      SLP Plan  Continue with current plan of care      Recommendations for follow up therapy are one component of a multi-disciplinary discharge planning process, led by the attending physician.   Recommendations may be updated based on patient status, additional functional criteria and insurance authorization.    Recommendations  Diet recommendations: Dysphagia 1 (puree);Thin liquid Liquids provided via: Cup;Straw Medication Administration: Crushed with puree Supervision: Staff to assist with self feeding Compensations: Minimize environmental distractions      Patient may use Passy-Muir Speech Valve: Intermittently with supervision MD: Please consider changing trach tube to : Cuffless           Oral care BID   Frequent or constant Supervision/Assistance Dysphagia, oropharyngeal phase (R13.12)     Continue with current plan of care    Madhuri Vacca L. Iram Astorino, MA CCC/SLP Clinical Specialist - Acute Care SLP Acute Rehabilitation Services Office number [phone]  [name] Laurice  07/16/2022, 11:00 AM 

## 2022-07-17 DIAGNOSIS — J9602 Acute respiratory failure with hypercapnia: Secondary | ICD-10-CM | POA: Diagnosis not present

## 2022-07-17 DIAGNOSIS — J9601 Acute respiratory failure with hypoxia: Secondary | ICD-10-CM | POA: Diagnosis not present

## 2022-07-17 LAB — CBC WITH DIFFERENTIAL/PLATELET
Basophils Absolute: 0.1 10*3/uL (ref 0.0–0.1)
Eosinophils Absolute: 0.2 10*3/uL (ref 0.0–0.5)
Eosinophils Relative: 1 %
Immature Granulocytes: 0 %
Lymphocytes Relative: 22 %
MCH: 30.4 pg (ref 26.0–34.0)
Neutro Abs: 7.2 10*3/uL (ref 1.7–7.7)
Neutrophils Relative %: 69 %
Platelets: 355 10*3/uL (ref 150–400)
RBC: 4.18 MIL/uL (ref 3.87–5.11)
RDW: 16.2 % — ABNORMAL HIGH (ref 11.5–15.5)
WBC: 10.4 10*3/uL (ref 4.0–10.5)
nRBC: 0 % (ref 0.0–0.2)

## 2022-07-17 LAB — COMPREHENSIVE METABOLIC PANEL
Albumin: 2.6 g/dL — ABNORMAL LOW (ref 3.5–5.0)
Alkaline Phosphatase: 102 U/L (ref 38–126)
Anion gap: 8 (ref 5–15)
Calcium: 9.9 mg/dL (ref 8.9–10.3)
Creatinine, Ser: 0.71 mg/dL (ref 0.44–1.00)
GFR, Estimated: >60 mL/min (ref >60)
Glucose, Bld: 144 mg/dL — ABNORMAL HIGH (ref 70–99)
Potassium: 3.1 mmol/L — ABNORMAL LOW (ref 3.5–5.1)
Sodium: 136 mmol/L (ref 135–145)
Total Bilirubin: 0.4 mg/dL (ref 0.3–1.2)
Total Protein: 6.4 g/dL — ABNORMAL LOW (ref 6.5–8.1)

## 2022-07-17 MED ORDER — POTASSIUM CHLORIDE 20 MEQ PO PACK
40.0000 meq | PACK | Freq: Once | ORAL | Status: AC
  Administered 2022-07-17: 40 meq via ORAL
  Filled 2022-07-17: qty 2

## 2022-07-17 NOTE — TOC Progression Note (Incomplete Revision)
Transition of Care (TOC) - Progression Note    Patient Details  Name: Carla [name] MRN: [id number] Date of Birth: 04/16/1945  Transition of Care (TOC) CM/SW Contact  Graves-Bigelow, Shondra Capps Kaye, RN Phone Number: 07/17/2022, 9:28 AM  Clinical Narrative:  [facility] is a transfer [facility]-discussed in progression meeting. [facility] has requested an expedited appeal for LTAC that was submitted yesterday. Awaiting to hear back from [facility] Liaison. Case Manager will continue to follow for transition of care needs.   1012 07-17-22 Case Manager spoke with [employer] Physician Advisor Stephen Evans regarding the LTAC appeal. Dr Evans recommends Peg tube since patient has continued poor po intake with her dementia baseline. Patient's trach will be 30 days old on 5/8 before patient can be submitted to a SNF for lower level of care. Case Manager has relayed the above information to the provider. Case Manager will continue to follow.   Expected Discharge Plan: Long Term Acute Care (LTAC) Barriers to Discharge: Continued Medical Work up  Expected Discharge Plan and Services   Discharge Planning Services: CM Consult Post Acute Care Choice: Long Term Acute Care (LTAC)  Readmission Risk Interventions     No data to display          

## 2022-07-17 NOTE — Progress Notes (Addendum)
PROGRESS NOTE    SAARAH DEWING  ONG:[id number] DOB: 09-15-1945 DOA: 06/12/2022 PCP: Patient, [name]   Brief Narrative:  This 77 yo female smoker with hx of COPD from emphysema, Hypertension, Dementia, Anxiety, Depression, GERD, Primary hyperparathyroidism, Vit D deficiency, HLD, Osteoporosis , found by family unresponsive and brought to ER. Found to have acute on chronic hypercapnia on ABG. Intubated in ER. She was started on therapy for COPD exacerbation. PCCM consulted to assist with respiratory management.      Significant Events  3/20 Admitted after being found unresponsive, intubated on ED arrival  CT angio chest >> atherosclerosis, severe coronary calcification, severe LVH, mild centrilobular emphysema, 2 mm nodule LUL, 4 mm nodule LUL Blood culture  >> Haemophilus influenzae 3/22 Had oxygen desaturation and low Vt with SBT this morning. Echo 06/14/22 >> EF 60 to 65%, mod LVH, grade 1 DD, mild/mod AR 3/25 bronch for peak pressures, low tidal volumes with mucus cast at end of ETT, thick secretions caking tube 3/29 Bronched again with purulent secretions LLL with plugging and LLL atelectasis, some increase in O2 requirement on vent, zosyn started for VAP coverage. MRSA nare swab today negative.  3/30 Remains intubated with waxing and weaning agitation on fentanyl and precedex  3/31 Remains on vent with continued issues with agitation when sedation lightened  4/2 family meeting, made DNR, plans for one way extubation 4/5 4/5-no overnight events, plan is for one-way extubation today 4/6-family changes mind regarding one-way extubation, plan will be for tracheostomy 4/8 trach placed 4/10 failed overnight weaning trial, back on PRVC and sedation  was added. Subsequently sedation weaned and enteral agents were dc and or decreased.  4/11 failed psv w some distress. Fever. Abx added 4/12 cont efforts at psv. Most awake she has been in days   4/13 Tolerating PS.  4/15 Has been on ATC x  1:45 minutes, RR 40 and accessory muscle use, placing back on vent 4/18 Cortrak clogged and thus replaced. Still working on weaning completed CTx for PNA. 48 hrs on ATC  4/19 off vent x 72 hours so removed from room.  Planning on changing trach to cuffless.  Continuing to work with PMV.  Hypoglycemia adding D5 to maintenance fluids 4/20 transfer out of ICU  4/21 transfer to [facility].    Assessment & Plan:   Principal Problem:   Acute respiratory failure with hypoxia and hypercapnia Active Problems:   COPD with exacerbation   Hyperglycemia   Acute metabolic encephalopathy   HTN (hypertension)   Tobacco abuse   Anxiety   Peripheral vascular disease   Pulmonary nodules   Dementia   Protein-calorie malnutrition, severe   Urinary retention   Hypervolemia   Encephalopathy acute   Tracheostomy in place   AKI (acute kidney injury)   Hypernatremia   Fever   Acute respiratory failure with hypoxia   Hyponatremia   Pressure injury of skin   Acute hypoxic and hypercapnic respiratory failure: S/p intubation on admission.  s/p tracheostomy , changed to trach collar and cuffless on 4/19.  Continue with brovana, pulmicort and  yupelri.  She is more alert and on 5 lit of Bertha oxygen.  PCCM managing trach care.   Acute metabolic encephalopathy > Improving  She has baseline Dementia.   Presented with unresponsiveness.  Head CT negative for acute abnormality.   Likely due to hypercarbic respiratory failure and ICU delirium.  Pulled out cortrak on 4/22. Currently she is alert and oriented to person only. Able to  answer some simple questions.  Slp evaluation recommended dysphagia 1 diet with thin liquids.    Type 2 DM:   Continue with SSI. No chagnes in meds.  Last A1c is 6 last month.      Acute kidney injury: Resolved.    Pulmonary nodules: Incidental finding on CT 06/12/2022 - 2 mm and 4 mm noncalcified left upper lobe lung nodules.  No follow-up needed if patient is low-risk (and has  no known or suspected primary neoplasm).  Non-contrast chest CT can be considered in 12 months if patient is high-risk.  -Follow-up as outpatient   HTN (hypertension) Better better controlled. Continue Norvasc 5 mg daily, will add coreg 3.125 mg BID.     Chronic diastolic heart failure:  She appears Euvolemic.  Continue to monitor.    Nutrition:  SLP on board, MBS DONE,  and recommended pureed/ dysphagia 1  diet.    Hypokalemia:  Replaced.Continue to monitor     RN Pressure Injury Documentation:     Pressure Injury 07/10/22 Nare Right;Medial Stage 2 -  Partial thickness loss of dermis presenting as a shallow open injury with a red, pink wound bed without slough. small, pink/red area due to bridle from Cortrak (Active)  07/10/22 4098  Location: Nare  Location Orientation: Right;Medial  Staging: Stage 2 -  Partial thickness loss of dermis presenting as a shallow open injury with a red, pink wound bed without slough.  Wound Description (Comments): small, pink/red area due to bridle from Cortrak  Present on Admission: No  Dressing Type None 07/16/22 0850  Continue with local Wound care   Disposition:  TOC looking into LTAC.      Malnutrition Type: Nutrition Problem: Severe Malnutrition Etiology: chronic illness (COPD, dementia)   Malnutrition Characteristics: Signs/Symptoms: severe fat depletion, severe muscle depletion     Nutrition Interventions: Interventions: Tube feeding   Estimated body mass index is 18.3 kg/m as calculated from the following:   Height as of this encounter: 5' (1.524 m).   Weight as of this encounter: 42.5 kg.   DVT prophylaxis: Lovenox Code Status: DNR Family Communication: No family at bed side. Disposition Plan:   Status is: Inpatient Remains inpatient appropriate because:  TOC on board for LTAC, not a candidate for CIR   Consultants:  Palliative care PCCM  Procedures: S/p trach Antimicrobials: Anti-infectives (From admission,  onward)    Start     Dose/Rate Route Frequency Ordered Stop   07/06/22 1000  cefTRIAXone (ROCEPHIN) 2 g in sodium chloride 0.9 % 100 mL IVPB        2 g 200 mL/hr over 30 Minutes Intravenous Every 24 hours 07/06/22 0848 07/08/22 0952   07/05/22 0200  piperacillin-tazobactam (ZOSYN) IVPB 3.375 g  Status:  Discontinued        3.375 g 12.5 mL/hr over 240 Minutes Intravenous Every 8 hours 07/04/22 1826 07/06/22 0848   07/04/22 1915  vancomycin (VANCOCIN) IVPB 1000 mg/200 mL premix  Status:  Discontinued        1,000 mg 200 mL/hr over 60 Minutes Intravenous Every 48 hours 07/04/22 1826 07/05/22 0827   07/04/22 1915  piperacillin-tazobactam (ZOSYN) IVPB 3.375 g        3.375 g 100 mL/hr over 30 Minutes Intravenous  Once 07/04/22 1826 07/04/22 1914   06/21/22 2300  piperacillin-tazobactam (ZOSYN) IVPB 3.375 g        3.375 g 12.5 mL/hr over 240 Minutes Intravenous Every 8 hours 06/21/22 1631 06/29/22 2009   06/21/22 1645  piperacillin-tazobactam (ZOSYN) IVPB 3.375 g  Status:  Discontinued        3.375 g 12.5 mL/hr over 240 Minutes Intravenous Every 8 hours 06/21/22 1630 06/21/22 1631   06/21/22 1645  piperacillin-tazobactam (ZOSYN) IVPB 3.375 g        3.375 g 100 mL/hr over 30 Minutes Intravenous STAT 06/21/22 1631 06/21/22 1900   06/14/22 1315  cefTRIAXone (ROCEPHIN) 2 g in sodium chloride 0.9 % 100 mL IVPB  Status:  Discontinued        2 g 200 mL/hr over 30 Minutes Intravenous Every 24 hours 06/14/22 1249 06/20/22 1415   06/12/22 2230  doxycycline (VIBRAMYCIN) 100 mg in sodium chloride 0.9 % 250 mL IVPB  Status:  Discontinued        100 mg 125 mL/hr over 120 Minutes Intravenous Every 12 hours 06/12/22 2139 06/14/22 1350      Subjective: Patient was seen and examined at bedside.  Overnight events noted. Patient was lying on the left side,  comfortable, arousable.  She remains in restraints probably to prevent removal of trach tube  Objective: Vitals:   07/17/22 0807 07/17/22 0824  07/17/22 1100 07/17/22 1157  BP: (!) 177/89   (!) 127/53  Pulse: 96 95 97 90  Resp: (!) 28 (!) 26 (!) 28 (!) 28  Temp: 98.1 F (36.7 C)   98 F (36.7 C)  TempSrc: Axillary   Axillary  SpO2: 93% 99% 92% 90%  Weight:      Height:        Intake/Output Summary (Last 24 hours) at 07/17/2022 1438 Last data filed at 07/17/2022 0920 Gross per 24 hour  Intake 1838.16 ml  Output 2325 ml  Net -486.84 ml   Filed Weights   07/15/22 0432 07/16/22 0431 07/17/22 0354  Weight: 43.5 kg 42.5 kg 40.5 kg    Examination:  General exam: Appears calm and comfortable, deconditioned, trach tube noted. Respiratory system: Clear to auscultation. Respiratory effort normal. RR 20 Cardiovascular system: S1 & S2 heard, RRR. No JVD, murmurs, rubs, gallops or clicks. No pedal edema. Gastrointestinal system: Abdomen is soft, nontender, nondistended, BS+ Central nervous system: Alert and oriented x 1. No focal neurological deficits. Extremities: Symmetric 5 x 5 power. Skin: No rashes, lesions or ulcers Psychiatry:  Mood & affect appropriate.     Data Reviewed: I have personally reviewed following labs and imaging studies  CBC: Recent Labs  Lab 07/12/22 0405 07/15/22 0545 07/17/22 0555  WBC 10.8* 10.3 10.4  NEUTROABS  --   --  7.2  HGB 13.0 12.5 12.7  HCT 40.6 39.4 40.4  MCV 95.3 96.1 96.7  PLT 449* 368 355   Basic Metabolic Panel: Recent Labs  Lab 07/12/22 0405 07/13/22 0206 07/15/22 0545 07/16/22 1530 07/17/22 0555  NA 138 134* 137 136 136  K 3.5 4.2 3.4* 3.4* 3.1*  CL 105 103 105 103 102  CO2 27 25 24 24 26   GLUCOSE 169* 189* 137* 121* 144*  BUN 36* 42* 18 15 11   CREATININE 0.64 0.71 0.78 0.74 0.71  CALCIUM 9.5 9.6 9.8 9.8 9.9  MG 2.3 2.3 2.2  --  2.2   GFR: Estimated Creatinine Clearance: 37.7 mL/min (by C-G formula based on SCr of 0.71 mg/dL). Liver Function Tests: Recent Labs  Lab 07/17/22 0555  AST 27  ALT 35  ALKPHOS 102  BILITOT 0.4  PROT 6.4*  ALBUMIN 2.6*   No  results for input(s): "LIPASE", "AMYLASE" in the last 168 hours. No results for input(s): "  AMMONIA" in the last 168 hours. Coagulation Profile: No results for input(s): "INR", "PROTIME" in the last 168 hours. Cardiac Enzymes: No results for input(s): "CKTOTAL", "CKMB", "CKMBINDEX", "TROPONINI" in the last 168 hours. BNP (last 3 results) No results for input(s): "PROBNP" in the last 8760 hours. HbA1C: No results for input(s): "HGBA1C" in the last 72 hours. CBG: Recent Labs  Lab 07/14/22 2106 07/15/22 0021 07/15/22 0430 07/15/22 0827 07/15/22 1251  GLUCAP 184* 124* 124* 139* 118*   Lipid Profile: No results for input(s): "CHOL", "HDL", "LDLCALC", "TRIG", "CHOLHDL", "LDLDIRECT" in the last 72 hours. Thyroid Function Tests: No results for input(s): "TSH", "T4TOTAL", "FREET4", "T3FREE", "THYROIDAB" in the last 72 hours. Anemia Panel: No results for input(s): "VITAMINB12", "FOLATE", "FERRITIN", "TIBC", "IRON", "RETICCTPCT" in the last 72 hours. Sepsis Labs: No results for input(s): "PROCALCITON", "LATICACIDVEN" in the last 168 hours.  No results found for this or any previous visit (from the past 240 hour(s)).   Radiology Studies: No results found.  Scheduled Meds:  amLODipine  5 mg Oral Daily   arformoterol  15 mcg Nebulization BID   budesonide (PULMICORT) nebulizer solution  0.5 mg Nebulization BID   carvedilol  3.125 mg Oral BID WC   Chlorhexidine Gluconate Cloth  6 each Topical Daily   clonazePAM  0.5 mg Per Tube QHS   clonazePAM  0.5 mg Per Tube Daily   enoxaparin (LOVENOX) injection  30 mg Subcutaneous Daily   feeding supplement  237 mL Oral BID BM   nutrition supplement (JUVEN)  1 packet Oral BID BM   OLANZapine  5 mg Intravenous Once   pantoprazole  40 mg Oral Daily   revefenacin  175 mcg Nebulization Daily   sodium chloride flush  10-40 mL Intracatheter Q12H   Continuous Infusions:  sodium chloride 10 mL/hr at 07/01/22 0503   sodium chloride 250 mL (07/04/22  1842)   dextrose 5 % and 0.45% NaCl 75 mL/hr at 07/17/22 0239     LOS: 35 days    Time spent: 50 mins    Willeen Niece, MD [facility]   If 7PM-7AM, please contact night-coverage

## 2022-07-17 NOTE — TOC Progression Note (Addendum)
Transition of Care (TOC) - Progression Note    Patient Details  Name: [name] MRN: [id number] Date of Birth: 04/16/1945  Transition of Care (TOC) CM/SW Contact  Graves-Bigelow, Shondra Capps Kaye, RN Phone Number: 07/17/2022, 9:28 AM  Clinical Narrative:  [facility] is a transfer [facility]-discussed in progression meeting. [facility] has requested an expedited appeal for LTAC that was submitted yesterday. Awaiting to hear back from [facility] Liaison. Case Manager will continue to follow for transition of care needs.   1012 07-17-22 Case Manager spoke with [employer] Physician Advisor Stephen Evans regarding the LTAC appeal. Dr Evans recommends Peg tube since patient has continued poor po intake with her dementia baseline. Patient's trach will be 30 days old on 5/8 before patient can be submitted to a SNF for lower level of care. Case Manager has relayed the above information to the provider. Case Manager will continue to follow.   Expected Discharge Plan: Long Term Acute Care (LTAC) Barriers to Discharge: Continued Medical Work up  Expected Discharge Plan and Services   Discharge Planning Services: CM Consult Post Acute Care Choice: Long Term Acute Care (LTAC)  Readmission Risk Interventions     No data to display          

## 2022-07-18 DIAGNOSIS — J9602 Acute respiratory failure with hypercapnia: Secondary | ICD-10-CM | POA: Diagnosis not present

## 2022-07-18 DIAGNOSIS — J9601 Acute respiratory failure with hypoxia: Secondary | ICD-10-CM | POA: Diagnosis not present

## 2022-07-18 NOTE — Progress Notes (Addendum)
PROGRESS NOTE    GEARLDINE LOONEY  EAV:[id number] DOB: 04-Aug-1945 DOA: 06/12/2022 PCP: Patient, [name]   Brief Narrative:  This 77 yo female smoker with hx of COPD from emphysema, Hypertension, Dementia, Anxiety, Depression, GERD, Primary hyperparathyroidism, Vit D deficiency, HLD, Osteoporosis , found by family unresponsive and brought to ER. Found to have acute on chronic hypercapnia on ABG. Intubated in ER. She was started on therapy for COPD exacerbation. PCCM consulted to assist with respiratory management.      Significant Events  3/20 Admitted after being found unresponsive, intubated on ED arrival  CT angio chest >> atherosclerosis, severe coronary calcification, severe LVH, mild centrilobular emphysema, 2 mm nodule LUL, 4 mm nodule LUL Blood culture  >> Haemophilus influenzae 3/22 Had oxygen desaturation and low Vt with SBT this morning. Echo 06/14/22 >> EF 60 to 65%, mod LVH, grade 1 DD, mild/mod AR 3/25 bronch for peak pressures, low tidal volumes with mucus cast at end of ETT, thick secretions caking tube 3/29 Bronched again with purulent secretions LLL with plugging and LLL atelectasis, some increase in O2 requirement on vent, zosyn started for VAP coverage. MRSA nare swab today negative.  3/30 Remains intubated with waxing and weaning agitation on fentanyl and precedex  3/31 Remains on vent with continued issues with agitation when sedation lightened  4/2 family meeting, made DNR, plans for one way extubation 4/5 4/5-no overnight events, plan is for one-way extubation today 4/6-family changes mind regarding one-way extubation, plan will be for tracheostomy 4/8 trach placed 4/10 failed overnight weaning trial, back on PRVC and sedation  was added. Subsequently sedation weaned and enteral agents were dc and or decreased.  4/11 failed psv w some distress. Fever. Abx added 4/12 cont efforts at psv. Most awake she has been in days   4/13 Tolerating PS.  4/15 Has been on ATC x  1:45 minutes, RR 40 and accessory muscle use, placing back on vent 4/18 Cortrak clogged and thus replaced. Still working on weaning completed CTx for PNA. 48 hrs on ATC  4/19 off vent x 72 hours so removed from room.  Planning on changing trach to cuffless.  Continuing to work with PMV.  Hypoglycemia adding D5 to maintenance fluids 4/20 transfer out of ICU  4/21 transfer to [facility].    Assessment & Plan:   Principal Problem:   Acute respiratory failure with hypoxia and hypercapnia Active Problems:   COPD with exacerbation   Hyperglycemia   Acute metabolic encephalopathy   HTN (hypertension)   Tobacco abuse   Anxiety   Peripheral vascular disease   Pulmonary nodules   Dementia   Protein-calorie malnutrition, severe   Urinary retention   Hypervolemia   Encephalopathy acute   Tracheostomy in place   AKI (acute kidney injury)   Hypernatremia   Fever   Acute respiratory failure with hypoxia   Hyponatremia   Pressure injury of skin   Acute hypoxic and hypercapnic respiratory failure: S/p intubation on admission.  s/p tracheostomy , changed to trach collar and cuffless on 4/19.  Continue with brovana, pulmicort and  yupelri.  She is more alert and on 5 lit of [place] oxygen.  PCCM managing trach care.   Acute metabolic encephalopathy > Improving  She has baseline Dementia.   Presented with unresponsiveness.  Head CT negative for acute abnormality.   Likely due to hypercarbic respiratory failure and ICU delirium.  Pulled out cortrak on 4/22. Currently she is alert and oriented to person only. Able to  answer some simple questions.  Slp evaluation recommended dysphagia 1 diet with thin liquids.    Type 2 DM:   Continue with SSI. No changes in meds.  Last Hb A1c is 6.0 last month.    Acute kidney injury: Resolved.    Pulmonary nodules: Incidental finding on CT 06/12/2022 - 2 mm and 4 mm noncalcified left upper lobe lung nodules.  No follow-up needed if patient is low-risk (and has  no known or suspected primary neoplasm).  Non-contrast chest CT can be considered in 12 months if patient is high-risk.  -Follow-up as outpatient   HTN (hypertension) Better better controlled. Continue Norvasc 5 mg daily, will add coreg 3.125 mg BID.     Chronic diastolic heart failure:  She appears Euvolemic.  Continue to monitor.    Nutrition:  SLP on board, MBS DONE,  and recommended pureed/ dysphagia 1  diet.    Hypokalemia:  Replaced.Continue to monitor     RN Pressure Injury Documentation:     Pressure Injury 07/10/22 Nare Right;Medial Stage 2 -  Partial thickness loss of dermis presenting as a shallow open injury with a red, pink wound bed without slough. small, pink/red area due to bridle from Cortrak (Active)  07/10/22 0981  Location: Nare  Location Orientation: Right;Medial  Staging: Stage 2 -  Partial thickness loss of dermis presenting as a shallow open injury with a red, pink wound bed without slough.  Wound Description (Comments): small, pink/red area due to bridle from Cortrak  Present on Admission: No  Dressing Type None 07/16/22 0850  Continue with local Wound care   Disposition:  TOC looking into LTAC.      Malnutrition Type: Nutrition Problem: Severe Malnutrition Etiology: chronic illness (COPD, dementia)   Malnutrition Characteristics: Signs/Symptoms: severe fat depletion, severe muscle depletion     Nutrition Interventions: Interventions: Tube feeding   Estimated body mass index is 18.3 kg/m as calculated from the following:   Height as of this encounter: 5' (1.524 m).   Weight as of this encounter: 42.5 kg.   DVT prophylaxis: Lovenox Code Status: DNR Family Communication: No family at bed side. Disposition Plan:   Status is: Inpatient Remains inpatient appropriate because:  TOC on board for LTAC, not a candidate for CIR   Consultants:  Palliative care PCCM  Procedures: S/p trach Antimicrobials: Anti-infectives (From admission,  onward)    Start     Dose/Rate Route Frequency Ordered Stop   07/06/22 1000  cefTRIAXone (ROCEPHIN) 2 g in sodium chloride 0.9 % 100 mL IVPB        2 g 200 mL/hr over 30 Minutes Intravenous Every 24 hours 07/06/22 0848 07/08/22 0952   07/05/22 0200  piperacillin-tazobactam (ZOSYN) IVPB 3.375 g  Status:  Discontinued        3.375 g 12.5 mL/hr over 240 Minutes Intravenous Every 8 hours 07/04/22 1826 07/06/22 0848   07/04/22 1915  vancomycin (VANCOCIN) IVPB 1000 mg/200 mL premix  Status:  Discontinued        1,000 mg 200 mL/hr over 60 Minutes Intravenous Every 48 hours 07/04/22 1826 07/05/22 0827   07/04/22 1915  piperacillin-tazobactam (ZOSYN) IVPB 3.375 g        3.375 g 100 mL/hr over 30 Minutes Intravenous  Once 07/04/22 1826 07/04/22 1914   06/21/22 2300  piperacillin-tazobactam (ZOSYN) IVPB 3.375 g        3.375 g 12.5 mL/hr over 240 Minutes Intravenous Every 8 hours 06/21/22 1631 06/29/22 2009   06/21/22 1645  piperacillin-tazobactam (  ZOSYN) IVPB 3.375 g  Status:  Discontinued        3.375 g 12.5 mL/hr over 240 Minutes Intravenous Every 8 hours 06/21/22 1630 06/21/22 1631   06/21/22 1645  piperacillin-tazobactam (ZOSYN) IVPB 3.375 g        3.375 g 100 mL/hr over 30 Minutes Intravenous STAT 06/21/22 1631 06/21/22 1900   06/14/22 1315  cefTRIAXone (ROCEPHIN) 2 g in sodium chloride 0.9 % 100 mL IVPB  Status:  Discontinued        2 g 200 mL/hr over 30 Minutes Intravenous Every 24 hours 06/14/22 1249 06/20/22 1415   06/12/22 2230  doxycycline (VIBRAMYCIN) 100 mg in sodium chloride 0.9 % 250 mL IVPB  Status:  Discontinued        100 mg 125 mL/hr over 120 Minutes Intravenous Every 12 hours 06/12/22 2139 06/14/22 1350      Subjective: Patient was seen and examined at bedside.  Overnight events noted. Patient was lying comfortably on the bed,  arousable. She remains in restraints probably to prevent removal of trach tube  Objective: Vitals:   07/18/22 0858 07/18/22 0900 07/18/22 1140  07/18/22 1201  BP:  (!) 164/73 (!) 153/71   Pulse:   84   Resp:  (!) 27    Temp:   97.8 F (36.6 C)   TempSrc:   Oral   SpO2: 100% 100% 100% 100%  Weight:      Height:        Intake/Output Summary (Last 24 hours) at 07/18/2022 1328 Last data filed at 07/18/2022 1140 Gross per 24 hour  Intake 1885.78 ml  Output 1550 ml  Net 335.78 ml   Filed Weights   07/16/22 0431 07/17/22 0354 07/18/22 0500  Weight: 42.5 kg 40.5 kg 40.3 kg    Examination:  General exam: Appears comfortable, deconditioned, sick looking, trach tube noted. Respiratory system: Clear to auscultation. Respiratory effort normal. RR 19 Cardiovascular system: S1-S2 heard, regular rate and rhythm, no murmur. Gastrointestinal system: Abdomen is soft, non tender, non distended, BS+ Central nervous system: Alert and oriented x 1. No focal neurological deficits. Extremities: Symmetric 5 x 5 power. Skin: No rashes, lesions or ulcers Psychiatry:  Mood & affect appropriate.     Data Reviewed: I have personally reviewed following labs and imaging studies  CBC: Recent Labs  Lab 07/12/22 0405 07/15/22 0545 07/17/22 0555  WBC 10.8* 10.3 10.4  NEUTROABS  --   --  7.2  HGB 13.0 12.5 12.7  HCT 40.6 39.4 40.4  MCV 95.3 96.1 96.7  PLT 449* 368 355   Basic Metabolic Panel: Recent Labs  Lab 07/12/22 0405 07/13/22 0206 07/15/22 0545 07/16/22 1530 07/17/22 0555  NA 138 134* 137 136 136  K 3.5 4.2 3.4* 3.4* 3.1*  CL 105 103 105 103 102  CO2 GLUCOSE 169* 189* 137* 121* 144*  BUN 36* 42* CREATININE 0.64 0.71 0.78 0.74 0.71  CALCIUM 9.5 9.6 9.8 9.8 9.9  MG 2.3 2.3 2.2  --  2.2   GFR: Estimated Creatinine Clearance: 37.5 mL/min (by C-G formula based on SCr of 0.71 mg/dL). Liver Function Tests: Recent Labs  Lab 07/17/22 0555  AST 27  ALT 35  ALKPHOS 102  BILITOT 0.4  PROT 6.4*  ALBUMIN 2.6*   No results for input(s): "LIPASE", "AMYLASE" in the last 168 hours. No results for  input(s): "AMMONIA" in the last 168 hours. Coagulation Profile: No results for input(s): "INR", "PROTIME"  in the last 168 hours. Cardiac Enzymes: No results for input(s): "CKTOTAL", "CKMB", "CKMBINDEX", "TROPONINI" in the last 168 hours. BNP (last 3 results) No results for input(s): "PROBNP" in the last 8760 hours. HbA1C: No results for input(s): "HGBA1C" in the last 72 hours. CBG: Recent Labs  Lab 07/14/22 2106 07/15/22 0021 07/15/22 0430 07/15/22 0827 07/15/22 1251  GLUCAP 184* 124* 124* 139* 118*   Lipid Profile: No results for input(s): "CHOL", "HDL", "LDLCALC", "TRIG", "CHOLHDL", "LDLDIRECT" in the last 72 hours. Thyroid Function Tests: No results for input(s): "TSH", "T4TOTAL", "FREET4", "T3FREE", "THYROIDAB" in the last 72 hours. Anemia Panel: No results for input(s): "VITAMINB12", "FOLATE", "FERRITIN", "TIBC", "IRON", "RETICCTPCT" in the last 72 hours. Sepsis Labs: No results for input(s): "PROCALCITON", "LATICACIDVEN" in the last 168 hours.  No results found for this or any previous visit (from the past 240 hour(s)).   Radiology Studies: No results found.  Scheduled Meds:  amLODipine  5 mg Oral Daily   arformoterol  15 mcg Nebulization BID   budesonide (PULMICORT) nebulizer solution  0.5 mg Nebulization BID   carvedilol  3.125 mg Oral BID WC   Chlorhexidine Gluconate Cloth  6 each Topical Daily   clonazePAM  0.5 mg Per Tube QHS   clonazePAM  0.5 mg Per Tube Daily   enoxaparin (LOVENOX) injection  30 mg Subcutaneous Daily   feeding supplement  237 mL Oral BID BM   nutrition supplement (JUVEN)  1 packet Oral BID BM   OLANZapine  5 mg Intravenous Once   pantoprazole  40 mg Oral Daily   revefenacin  175 mcg Nebulization Daily   sodium chloride flush  10-40 mL Intracatheter Q12H   Continuous Infusions:  sodium chloride 10 mL/hr at 07/01/22 0503   sodium chloride 250 mL (07/04/22 1842)   dextrose 5 % and 0.45% NaCl 75 mL/hr at 07/17/22 1528     LOS: 36 days     Time spent: 35 mins    Willeen Niece, MD [facility]   If 7PM-7AM, please contact night-coverage

## 2022-07-19 DIAGNOSIS — J9601 Acute respiratory failure with hypoxia: Secondary | ICD-10-CM | POA: Diagnosis not present

## 2022-07-19 DIAGNOSIS — J9602 Acute respiratory failure with hypercapnia: Secondary | ICD-10-CM | POA: Diagnosis not present

## 2022-07-19 LAB — BASIC METABOLIC PANEL
CO2: 25 mmol/L (ref 22–32)
Calcium: 9.6 mg/dL (ref 8.9–10.3)
Creatinine, Ser: 0.69 mg/dL (ref 0.44–1.00)
GFR, Estimated: >60 mL/min (ref >60)
Glucose, Bld: 100 mg/dL — ABNORMAL HIGH (ref 70–99)
Potassium: 3.2 mmol/L — ABNORMAL LOW (ref 3.5–5.1)
Sodium: 138 mmol/L (ref 135–145)

## 2022-07-19 LAB — HEMOGLOBIN AND HEMATOCRIT, BLOOD: HCT: 38.8 % (ref 36.0–46.0)

## 2022-07-19 MED ORDER — POTASSIUM CHLORIDE 20 MEQ PO PACK
40.0000 meq | PACK | Freq: Once | ORAL | Status: AC
  Administered 2022-07-19: 40 meq via ORAL
  Filled 2022-07-19: qty 2

## 2022-07-19 NOTE — TOC Progression Note (Incomplete Revision)
Transition of Care (TOC) - Progression Note    Patient Details  Name: Carla [name] MRN: [id number] Date of Birth: 11/25/1945  Transition of Care (TOC) CM/SW Contact  Graves-Bigelow, Brittania Sudbeck Kaye, RN Phone Number: 07/19/2022, 3:03 PM  Clinical Narrative: Case Manager received notification that the expedited appeal has been denied via insurance. Per CIR notes- patient is still inappropriate for CIR at this time. TOC will continue to follow for transition of care needs.     Expected Discharge Plan: Skilled Nursing Facility Barriers to Discharge: Continued Medical Work up  Expected Discharge Plan and Services   Discharge Planning Services: CM Consult Post Acute Care Choice: Long Term Acute Care (LTAC)   Readmission Risk Interventions     No data to display          

## 2022-07-19 NOTE — Progress Notes (Addendum)
PROGRESS NOTE    NEAL OSHEA  ZOX:[id number] DOB: 11/14/1945 DOA: 06/12/2022 PCP: Patient, [name]   Brief Narrative:  This 77 yo female smoker with hx of COPD from emphysema, Hypertension, Dementia, Anxiety, Depression, GERD, Primary hyperparathyroidism, Vit D deficiency, HLD, Osteoporosis , found by family unresponsive and brought to ER. Found to have acute on chronic hypercapnia on ABG. Intubated in ER. She was started on therapy for COPD exacerbation. PCCM consulted to assist with respiratory management.      Significant Events  3/20 Admitted after being found unresponsive, intubated on ED arrival  CT angio chest >> atherosclerosis, severe coronary calcification, severe LVH, mild centrilobular emphysema, 2 mm nodule LUL, 4 mm nodule LUL Blood culture  >> Haemophilus influenzae 3/22 Had oxygen desaturation and low Vt with SBT this morning. Echo 06/14/22 >> EF 60 to 65%, mod LVH, grade 1 DD, mild/mod AR 3/25 bronch for peak pressures, low tidal volumes with mucus cast at end of ETT, thick secretions caking tube 3/29 Bronched again with purulent secretions LLL with plugging and LLL atelectasis, some increase in O2 requirement on vent, zosyn started for VAP coverage. MRSA nare swab today negative.  3/30 Remains intubated with waxing and weaning agitation on fentanyl and precedex  3/31 Remains on vent with continued issues with agitation when sedation lightened  4/2 family meeting, made DNR, plans for one way extubation 4/5 4/5-no overnight events, plan is for one-way extubation today 4/6-family changes mind regarding one-way extubation, plan will be for tracheostomy 4/8 trach placed 4/10 failed overnight weaning trial, back on PRVC and sedation  was added. Subsequently sedation weaned and enteral agents were dc and or decreased.  4/11 failed psv w some distress. Fever. Abx added 4/12 cont efforts at psv. Most awake she has been in days   4/13 Tolerating PS.  4/15 Has been on ATC x  1:45 minutes, RR 40 and accessory muscle use, placing back on vent 4/18 Cortrak clogged and thus replaced. Still working on weaning completed CTx for PNA. 48 hrs on ATC  4/19 off vent x 72 hours so removed from room.  Planning on changing trach to cuffless.  Continuing to work with PMV.  Hypoglycemia adding D5 to maintenance fluids 4/20 transfer out of ICU  4/21 transfer to [facility].  4/26: Patient pulled tracheostomy tube.  Placed it back.   Assessment & Plan:   Principal Problem:   Acute respiratory failure with hypoxia and hypercapnia (HCC) Active Problems:   COPD with exacerbation (HCC)   Hyperglycemia   Acute metabolic encephalopathy   HTN (hypertension)   Tobacco abuse   Anxiety   Peripheral vascular disease (HCC)   Pulmonary nodules   Dementia (HCC)   Protein-calorie malnutrition, severe (HCC)   Urinary retention   Hypervolemia   Encephalopathy acute   Tracheostomy in place (HCC)   AKI (acute kidney injury) (HCC)   Hypernatremia   Fever   Acute respiratory failure with hypoxia (HCC)   Hyponatremia   Pressure injury of skin   Acute hypoxic and hypercapnic respiratory failure: S/p intubation on admission.  s/p tracheostomy , changed to trach collar and cuffless on 4/19.  Continue with brovana, pulmicort and yupelri.  She is more alert and on 5 lit of [place] oxygen.  PCCM managing trach care.   Acute metabolic encephalopathy > Improving  She has baseline Dementia.   Presented with unresponsiveness.  Head CT negative for acute abnormality.   Likely due to hypercarbic respiratory failure and ICU delirium.  Pulled out cortrak on 4/22. Currently she is alert and oriented to person only.  Able to answer some simple questions.  Slp evaluation recommended dysphagia 1 diet with thin liquids.  Patient has on and off agitation requiring Versed push.   Type 2 DM:   Continue with SSI. No changes in meds.  Last Hb A1c is 6.0 last month.    Acute kidney injury: Resolved.     Pulmonary nodules: Incidental finding on CT 06/12/2022 - 2 mm and 4 mm noncalcified left upper lobe lung nodules.  No follow-up needed if patient is low-risk (and has no known or suspected primary neoplasm).  Non-contrast chest CT can be considered in 12 months if patient is high-risk.  -Follow-up as outpatient   HTN (hypertension) Better better controlled. Continue Norvasc 5 mg daily,  coreg 3.125 mg BID.     Chronic diastolic heart failure:  She appears Euvolemic.  Continue to monitor.    Nutrition:  SLP on board, MBS DONE,  and recommended pureed/ dysphagia 1  diet.    Hypokalemia:  Replaced.Continue to monitor     RN Pressure Injury Documentation:     Pressure Injury 07/10/22 Nare Right;Medial Stage 2 -  Partial thickness loss of dermis presenting as a shallow open injury with a red, pink wound bed without slough. small, pink/red area due to bridle from Cortrak (Active)  07/10/22 0981  Location: Nare  Location Orientation: Right;Medial  Staging: Stage 2 -  Partial thickness loss of dermis presenting as a shallow open injury with a red, pink wound bed without slough.  Wound Description (Comments): small, pink/red area due to bridle from Cortrak  Present on Admission: No  Dressing Type None 07/16/22 0850  Continue with local Wound care   Disposition:  TOC looking into LTAC.      Malnutrition Type: Nutrition Problem: Severe Malnutrition Etiology: chronic illness (COPD, dementia)   Malnutrition Characteristics: Signs/Symptoms: severe fat depletion, severe muscle depletion     Nutrition Interventions: Interventions: Tube feeding   Estimated body mass index is 18.3 kg/m as calculated from the following:   Height as of this encounter: 5' (1.524 m).   Weight as of this encounter: 42.5 kg.   DVT prophylaxis: Lovenox Code Status: DNR Family Communication: No family at bed side. Disposition Plan:   Status is: Inpatient Remains inpatient appropriate because:  TOC  on board for LTAC, not a candidate for CIR TOC states 1 month after tracheostomy placement to look for SNF options.   Consultants:  Palliative care PCCM  Procedures: S/p trach  Antimicrobials: Anti-infectives (From admission, onward)    Start     Dose/Rate Route Frequency Ordered Stop   07/06/22 1000  cefTRIAXone (ROCEPHIN) 2 g in sodium chloride 0.9 % 100 mL IVPB        2 g 200 mL/hr over 30 Minutes Intravenous Every 24 hours 07/06/22 0848 07/08/22 0952   07/05/22 0200  piperacillin-tazobactam (ZOSYN) IVPB 3.375 g  Status:  Discontinued        3.375 g 12.5 mL/hr over 240 Minutes Intravenous Every 8 hours 07/04/22 1826 07/06/22 0848   07/04/22 1915  vancomycin (VANCOCIN) IVPB 1000 mg/200 mL premix  Status:  Discontinued        1,000 mg 200 mL/hr over 60 Minutes Intravenous Every 48 hours 07/04/22 1826 07/05/22 0827   07/04/22 1915  piperacillin-tazobactam (ZOSYN) IVPB 3.375 g        3.375 g 100 mL/hr over 30 Minutes Intravenous  Once 07/04/22 1826 07/04/22 1914  06/21/22 2300  piperacillin-tazobactam (ZOSYN) IVPB 3.375 g        3.375 g 12.5 mL/hr over 240 Minutes Intravenous Every 8 hours 06/21/22 1631 06/29/22 2009   06/21/22 1645  piperacillin-tazobactam (ZOSYN) IVPB 3.375 g  Status:  Discontinued        3.375 g 12.5 mL/hr over 240 Minutes Intravenous Every 8 hours 06/21/22 1630 06/21/22 1631   06/21/22 1645  piperacillin-tazobactam (ZOSYN) IVPB 3.375 g        3.375 g 100 mL/hr over 30 Minutes Intravenous STAT 06/21/22 1631 06/21/22 1900   06/14/22 1315  cefTRIAXone (ROCEPHIN) 2 g in sodium chloride 0.9 % 100 mL IVPB  Status:  Discontinued        2 g 200 mL/hr over 30 Minutes Intravenous Every 24 hours 06/14/22 1249 06/20/22 1415   06/12/22 2230  doxycycline (VIBRAMYCIN) 100 mg in sodium chloride 0.9 % 250 mL IVPB  Status:  Discontinued        100 mg 125 mL/hr over 120 Minutes Intravenous Every 12 hours 06/12/22 2139 06/14/22 1350      Subjective: Patient was seen and  examined at bedside.  Overnight events noted. Patient was lying comfortably on the bed, arousable. She remains in restraints probably to prevent removal of trach tube. She is awaiting LTAC placement.  Objective: Vitals:   07/19/22 0617 07/19/22 0757 07/19/22 1132 07/19/22 1212  BP: (!) 158/68 135/85    Pulse:      Resp:      Temp:  98 F (36.7 C) 98.2 F (36.8 C)   TempSrc:  Axillary Axillary   SpO2: 97%   99%  Weight:      Height:        Intake/Output Summary (Last 24 hours) at 07/19/2022 1339 Last data filed at 07/19/2022 0600 Gross per 24 hour  Intake 1756.48 ml  Output 1950 ml  Net -193.52 ml   Filed Weights   07/18/22 0500 07/19/22 0500 07/19/22 0523  Weight: 40.3 kg 39.6 kg 39.6 kg    Examination:  General exam: Appears comfortable, deconditioned, sick looking, trach tube noted. Respiratory system: Clear to auscultation. Respiratory effort normal. RR 20 Cardiovascular system: S1-S2 heard, regular rate and rhythm, no murmur. Gastrointestinal system: Abdomen is soft, non tender, non distended, BS+ Central nervous system: Alert and oriented x 1. No focal neurological deficits. Extremities: Symmetric 5 x 5 power. Skin: No rashes, lesions or ulcers Psychiatry:  Mood & affect appropriate.     Data Reviewed: I have personally reviewed following labs and imaging studies  CBC: Recent Labs  Lab 07/15/22 0545 07/17/22 0555 07/19/22 0300  WBC 10.3 10.4  --   NEUTROABS  --  7.2  --   HGB 12.5 12.7 12.6  HCT 39.4 40.4 38.8  MCV 96.1 96.7  --   PLT 368 355  --    Basic Metabolic Panel: Recent Labs  Lab 07/13/22 0206 07/15/22 0545 07/16/22 1530 07/17/22 0555 07/19/22 0300  NA 134* 137 136 136 138  K 4.2 3.4* 3.4* 3.1* 3.2*  CL 103 105 103 102 104  CO2 25 24 24 26 25   GLUCOSE 189* 137* 121* 144* 100*  BUN 42* 18 15 11 14   CREATININE 0.71 0.78 0.74 0.71 0.69  CALCIUM 9.6 9.8 9.8 9.9 9.6  MG 2.3 2.2  --  2.2 1.9  PHOS  --   --   --   --  2.4*    GFR: Estimated Creatinine Clearance: 36.8 mL/min (by C-G formula based on  SCr of 0.69 mg/dL). Liver Function Tests: Recent Labs  Lab 07/17/22 0555  AST 27  ALT 35  ALKPHOS 102  BILITOT 0.4  PROT 6.4*  ALBUMIN 2.6*   No results for input(s): "LIPASE", "AMYLASE" in the last 168 hours. No results for input(s): "AMMONIA" in the last 168 hours. Coagulation Profile: No results for input(s): "INR", "PROTIME" in the last 168 hours. Cardiac Enzymes: No results for input(s): "CKTOTAL", "CKMB", "CKMBINDEX", "TROPONINI" in the last 168 hours. BNP (last 3 results) No results for input(s): "PROBNP" in the last 8760 hours. HbA1C: No results for input(s): "HGBA1C" in the last 72 hours. CBG: Recent Labs  Lab 07/14/22 2106 07/15/22 0021 07/15/22 0430 07/15/22 0827 07/15/22 1251  GLUCAP 184* 124* 124* 139* 118*   Lipid Profile: No results for input(s): "CHOL", "HDL", "LDLCALC", "TRIG", "CHOLHDL", "LDLDIRECT" in the last 72 hours. Thyroid Function Tests: No results for input(s): "TSH", "T4TOTAL", "FREET4", "T3FREE", "THYROIDAB" in the last 72 hours. Anemia Panel: No results for input(s): "VITAMINB12", "FOLATE", "FERRITIN", "TIBC", "IRON", "RETICCTPCT" in the last 72 hours. Sepsis Labs: No results for input(s): "PROCALCITON", "LATICACIDVEN" in the last 168 hours.  No results found for this or any previous visit (from the past 240 hour(s)).   Radiology Studies: No results found.  Scheduled Meds:  amLODipine  5 mg Oral Daily   arformoterol  15 mcg Nebulization BID   budesonide (PULMICORT) nebulizer solution  0.5 mg Nebulization BID   carvedilol  3.125 mg Oral BID WC   Chlorhexidine Gluconate Cloth  6 each Topical Daily   clonazePAM  0.5 mg Per Tube QHS   clonazePAM  0.5 mg Per Tube Daily   enoxaparin (LOVENOX) injection  30 mg Subcutaneous Daily   feeding supplement  237 mL Oral BID BM   nutrition supplement (JUVEN)  1 packet Oral BID BM   OLANZapine  5 mg Intravenous Once    pantoprazole  40 mg Oral Daily   revefenacin  175 mcg Nebulization Daily   sodium chloride flush  10-40 mL Intracatheter Q12H   Continuous Infusions:  sodium chloride 10 mL/hr at 07/01/22 0503   sodium chloride 250 mL (07/04/22 1842)   dextrose 5 % and 0.45% NaCl 75 mL/hr at 07/19/22 0527     LOS: 37 days    Time spent: 35 mins    Willeen Niece, MD [facility]   If 7PM-7AM, please contact night-coverage

## 2022-07-19 NOTE — Progress Notes (Addendum)
Speech Language Pathology Treatment: Dysphagia  Patient Details Name: Carla [name] MRN: [id number] DOB: 10/09/1945 Today's Date: 07/19/2022 Time: 1231-1253 SLP Time Calculation (min) (ACUTE ONLY): 22 min  Assessment / Plan / Recommendation Clinical Impression  Carla [name] was assisted with lunch. PMV placed; HOB elevated. She was able to feed herself with RUE with only occasional physical assist to load the spoon. (Consumed 50% of tray.) No concerns for aspiration with liquids; thorough mastication of solids, no oral residue post-swallow. Verbalizing minimally today, but tolerating valve well with no change in vital signs.  Continue using valve when staff is present through weekend. SLP will follow for readiness to advance diet next week.   HPI HPI: Carla [name] is a 77 yo female who was found by family unresponsive and brought to ER 06/12/22.  Found to have acute on chronic hypercapnia on ABG.  Intubated in ER and unable to wean from vent.  Initial plans for one way extubation, but with change in goals. Tracheostomy placed 4/8.  Difficulty weaning from vent, but tolerating trach collar for 48 hours as of 4/18. Pt with with hx COPD from emphysema, tobacco abuse, peripheral vascular disease, hypertension, prediabetes, anxiety, baseline dementia.      SLP Plan  Continue with current plan of care      Recommendations for follow up therapy are one component of a multi-disciplinary discharge planning process, led by the attending physician.  Recommendations may be updated based on patient status, additional functional criteria and insurance authorization.    Recommendations  Diet recommendations: Dysphagia 1 (puree);Thin liquid Liquids provided via: Cup;Straw Medication Administration: Crushed with puree Supervision: Staff to assist with self feeding Compensations: Minimize environmental distractions      Patient may use Passy-Muir Speech Valve: Intermittently with supervision PMSV  Supervision: Full MD: Please consider changing trach tube to : Cuffless           Oral care BID   Frequent or constant Supervision/Assistance Dysphagia, oropharyngeal phase (R13.12)     Continue with current plan of care    [name] L. Lashawna Poche, MA CCC/SLP Clinical Specialist - Acute Care SLP Acute Rehabilitation Services Office number [phone]  [name] Laurice  07/19/2022, 1:30 PM 

## 2022-07-19 NOTE — Progress Notes (Incomplete Revision)
Speech Language Pathology Treatment: Dysphagia  Patient Details Name: Carla [name] MRN: [id number] DOB: 10/09/1945 Today's Date: 07/19/2022 Time: 1231-1253 SLP Time Calculation (min) (ACUTE ONLY): 22 min  Assessment / Plan / Recommendation Clinical Impression  Carla [name] was assisted with lunch. PMV placed; HOB elevated. She was able to feed herself with RUE with only occasional physical assist to load the spoon. (Consumed 50% of tray.) No concerns for aspiration with liquids; thorough mastication of solids, no oral residue post-swallow. Verbalizing minimally today, but tolerating valve well with no change in vital signs.  Continue using valve when staff is present through weekend. SLP will follow for readiness to advance diet next week.   HPI HPI: Carla [name] is a 77 yo female who was found by family unresponsive and brought to ER 06/12/22.  Found to have acute on chronic hypercapnia on ABG.  Intubated in ER and unable to wean from vent.  Initial plans for one way extubation, but with change in goals. Tracheostomy placed 4/8.  Difficulty weaning from vent, but tolerating trach collar for 48 hours as of 4/18. Pt with with hx COPD from emphysema, tobacco abuse, peripheral vascular disease, hypertension, prediabetes, anxiety, baseline dementia.      SLP Plan  Continue with current plan of care      Recommendations for follow up therapy are one component of a multi-disciplinary discharge planning process, led by the attending physician.  Recommendations may be updated based on patient status, additional functional criteria and insurance authorization.    Recommendations  Diet recommendations: Dysphagia 1 (puree);Thin liquid Liquids provided via: Cup;Straw Medication Administration: Crushed with puree Supervision: Staff to assist with self feeding Compensations: Minimize environmental distractions      Patient may use Passy-Muir Speech Valve: Intermittently with supervision PMSV  Supervision: Full MD: Please consider changing trach tube to : Cuffless           Oral care BID   Frequent or constant Supervision/Assistance Dysphagia, oropharyngeal phase (R13.12)     Continue with current plan of care    Carla [name] L. Carla Poche, MA CCC/SLP Clinical Specialist - Acute Care SLP Acute Rehabilitation Services Office number [phone]  Carla [name] Laurice  07/19/2022, 1:30 PM 

## 2022-07-19 NOTE — Progress Notes (Addendum)
Physical Therapy Treatment Patient Details Name:  C  MRN:  DOB:  Today's Date: 07/19/2022   History of Present Illness 77 yo female admitted 3/20 unresponsive from home with acute respiratory failure with hypoxia and hypercapnia and metabolic encephalopathy. Intubated 3/20. trach 4/8. PMhx: emphysema, tobacco abuse, PVD, HTN, prediabetes, anxiety, dementia    PT Comments    Pt making slow progress. Able to follow some commands with multimodal cues. Able to stand with Stedy and 2 person assist 5 times. Patient will benefit from continued post acute follow up therapy, <3 hours/day    Recommendations for follow up therapy are one component of a multi-disciplinary discharge planning process, led by the attending physician.  Recommendations may be updated based on patient status, additional functional criteria and insurance authorization.  Follow Up Recommendations       Assistance Recommended at Discharge Frequent or constant Supervision/Assistance  Patient can return home with the following Two people to help with walking and/or transfers;Two people to help with bathing/dressing/bathroom;Direct supervision/assist for medications management;Assistance with cooking/housework;Assist for transportation   Equipment Recommendations  Hospital bed;Wheelchair (measurements PT);Wheelchair cushion (measurements PT)    Recommendations for Other Services       Precautions / Restrictions Precautions Precautions: Fall Precaution Comments: trach collar     Mobility  Bed Mobility Overal bed mobility: Needs Assistance Bed Mobility: Supine to Sit, Sit to Supine     Supine to sit: Mod assist, +2 for safety/equipment, HOB elevated Sit to supine: Min assist, +2 for safety/equipment   General bed mobility comments: Assist to initiate feet off of bed, to elevate trunk into sitting and bring hips to EOB. Assist to bring legs back up into bed.    Transfers Overall  transfer level: Needs assistance Equipment used: Ambulation equipment used Transfers: Sit to/from Stand Sit to Stand: +2 physical assistance, Min assist, Mod assist           General transfer comment: Used Stedy. Initiall +2 mod assist to power up and for balance. After initial sit to stand was able to repeat x 4 with +2 min assist    Ambulation/Gait             Pre-gait activities: Stood x 5 in Steady for 20-60 sec with min assist to maintain with 2nd person maintaining control of hand to not pull out lines/tubes.     Stairs             Wheelchair Mobility    Modified Rankin (Stroke Patients Only)       Balance Overall balance assessment: Needs assistance Sitting-balance support: Feet supported, Bilateral upper extremity supported Sitting balance-Leahy Scale: Poor Sitting balance - Comments: Primarily fait but intermittent unexpected moving ot trunk requiring min assist to correct   Standing balance support: Bilateral upper extremity supported Standing balance-Leahy Scale: Poor Standing balance comment: Stedy and +2 min assist for static standing                            Cognition Arousal/Alertness: Awake/alert Behavior During Therapy: Restless, Impulsive Overall Cognitive Status: Difficult to assess Area of Impairment: Attention, Following commands, Problem solving                   Current Attention Level: Sustained   Following Commands: Follows one step commands inconsistently, Follows one step commands with increased time     Problem Solving: Requires verbal cues, Requires tactile cues, Difficulty sequencing General Comments: Pt required multimodal   cues to follow commands        Exercises      General Comments General comments (skin integrity, edema, etc.): HR to 120's with activity      Pertinent Vitals/Pain Pain Assessment Pain Assessment: Faces Faces Pain Scale: No hurt    Home Living                           Prior Function            PT Goals (current goals can now be found in the care plan section) Acute Rehab PT Goals Patient Stated Goal: not stated PT Goal Formulation: Patient unable to participate in goal setting Time For Goal Achievement: 08/02/22 Potential to Achieve Goals: Fair Progress towards PT goals: Goals met and updated - see care plan    Frequency    Min 1X/week      PT Plan Current plan remains appropriate    Co-evaluation              AM-PAC PT "6 Clicks" Mobility   Outcome Measure  Help needed turning from your back to your side while in a flat bed without using bedrails?: Total Help needed moving from lying on your back to sitting on the side of a flat bed without using bedrails?: Total Help needed moving to and from a bed to a chair (including a wheelchair)?: Total Help needed standing up from a chair using your arms (e.g., wheelchair or bedside chair)?: Total Help needed to walk in hospital room?: Total Help needed climbing 3-5 steps with a railing? : Total 6 Click Score: 6    End of Session Equipment Utilized During Treatment: Gait belt;Oxygen Activity Tolerance: Patient tolerated treatment well Patient left: in bed;with call bell/phone within reach;with bed alarm set   PT Visit Diagnosis: Other abnormalities of gait and mobility (R26.89);Muscle weakness (generalized) (M62.81);Difficulty in walking, not elsewhere classified (R26.2)     Time: 1010-1031 PT Time Calculation (min) (ACUTE ONLY): 21 min  Charges:  $Therapeutic Activity: 8-22 mins                     Demetress Tift PT Acute Rehabilitation Services Office [phone]    Dutchess Crosland W [place] 07/19/2022, 1:45 PM   

## 2022-07-19 NOTE — TOC Progression Note (Addendum)
Transition of Care (TOC) - Progression Note    Patient Details  Name: Carla [name] MRN: [id number] Date of Birth: 11/25/1945  Transition of Care (TOC) CM/SW Contact  Graves-Bigelow, Brittania Sudbeck Kaye, RN Phone Number: 07/19/2022, 3:03 PM  Clinical Narrative: Case Manager received notification that the expedited appeal has been denied via insurance. Per CIR notes- patient is still inappropriate for CIR at this time. TOC will continue to follow for transition of care needs.     Expected Discharge Plan: Skilled Nursing Facility Barriers to Discharge: Continued Medical Work up  Expected Discharge Plan and Services   Discharge Planning Services: CM Consult Post Acute Care Choice: Long Term Acute Care (LTAC)   Readmission Risk Interventions     No data to display          

## 2022-07-19 NOTE — Care Management Important Message (Addendum)
Important Message  Patient Details  Name: Carla [name] MRN: [id number] Date of Birth: 09/08/1945   Medicare Important Message Given:  Yes     Renie Ora 07/19/2022, 1:23 PM

## 2022-07-20 DIAGNOSIS — J9601 Acute respiratory failure with hypoxia: Secondary | ICD-10-CM | POA: Diagnosis not present

## 2022-07-20 DIAGNOSIS — J9602 Acute respiratory failure with hypercapnia: Secondary | ICD-10-CM | POA: Diagnosis not present

## 2022-07-20 LAB — PHOSPHORUS: Phosphorus: 2.5 mg/dL (ref 2.5–4.6)

## 2022-07-20 LAB — BASIC METABOLIC PANEL
Anion gap: 7 (ref 5–15)
Chloride: 106 mmol/L (ref 98–111)
Creatinine, Ser: 0.71 mg/dL (ref 0.44–1.00)
Sodium: 137 mmol/L (ref 135–145)

## 2022-07-20 LAB — CBC
Hemoglobin: 12.6 g/dL (ref 12.0–15.0)
RBC: 4.13 MIL/uL (ref 3.87–5.11)
WBC: 6.9 10*3/uL (ref 4.0–10.5)

## 2022-07-20 LAB — MAGNESIUM: Magnesium: 2 mg/dL (ref 1.7–2.4)

## 2022-07-20 NOTE — Progress Notes (Addendum)
PROGRESS NOTE    KAMDYN COVEL  ZOX:[id number] DOB: Dec 02, 1945 DOA: 06/12/2022 PCP: Patient, [name]   Brief Narrative:  This 77 yo female smoker with hx of COPD from emphysema, Hypertension, Dementia, Anxiety, Depression, GERD, Primary hyperparathyroidism, Vit D deficiency, HLD, Osteoporosis , found by family unresponsive and brought to ER. Found to have acute on chronic hypercapnia on ABG. Intubated in ER. She was started on therapy for COPD exacerbation. PCCM consulted to assist with respiratory management.      Significant Events  3/20 Admitted after being found unresponsive, intubated on ED arrival  CT angio chest >> atherosclerosis, severe coronary calcification, severe LVH, mild centrilobular emphysema, 2 mm nodule LUL, 4 mm nodule LUL Blood culture  >> Haemophilus influenzae 3/22 Had oxygen desaturation and low Vt with SBT this morning. Echo 06/14/22 >> EF 60 to 65%, mod LVH, grade 1 DD, mild/mod AR 3/25 bronch for peak pressures, low tidal volumes with mucus cast at end of ETT, thick secretions caking tube 3/29 Bronched again with purulent secretions LLL with plugging and LLL atelectasis, some increase in O2 requirement on vent, zosyn started for VAP coverage. MRSA nare swab today negative.  3/30 Remains intubated with waxing and weaning agitation on fentanyl and precedex  3/31 Remains on vent with continued issues with agitation when sedation lightened  4/2 family meeting, made DNR, plans for one way extubation 4/5 4/5-no overnight events, plan is for one-way extubation today 4/6-family changes mind regarding one-way extubation, plan will be for tracheostomy 4/8 trach placed 4/10 failed overnight weaning trial, back on PRVC and sedation  was added. Subsequently sedation weaned and enteral agents were dc and or decreased.  4/11 failed psv w some distress. Fever. Abx added 4/12 cont efforts at psv. Most awake she has been in days   4/13 Tolerating PS.  4/15 Has been on ATC x  1:45 minutes, RR 40 and accessory muscle use, placing back on vent 4/18 Cortrak clogged and thus replaced. Still working on weaning completed CTx for PNA. 48 hrs on ATC  4/19 off vent x 72 hours so removed from room.  Planning on changing trach to cuffless.  Continuing to work with PMV.  Hypoglycemia adding D5 to maintenance fluids 4/20 transfer out of ICU  4/21 transfer to [facility].  4/26: Patient pulled tracheostomy tube.  Placed it back.   Assessment & Plan:   Principal Problem:   Acute respiratory failure with hypoxia and hypercapnia (HCC) Active Problems:   COPD with exacerbation (HCC)   Hyperglycemia   Acute metabolic encephalopathy   HTN (hypertension)   Tobacco abuse   Anxiety   Peripheral vascular disease (HCC)   Pulmonary nodules   Dementia (HCC)   Protein-calorie malnutrition, severe (HCC)   Urinary retention   Hypervolemia   Encephalopathy acute   Tracheostomy in place (HCC)   AKI (acute kidney injury) (HCC)   Hypernatremia   Fever   Acute respiratory failure with hypoxia (HCC)   Hyponatremia   Pressure injury of skin  Acute hypoxic and hypercapnic respiratory failure: S/p intubation on admission.  s/p tracheostomy , changed to trach collar and cuffless on 4/19.  Continue with brovana, pulmicort and yupelri.  She is more alert and on 5 lit of [place] oxygen.  PCCM managing trach care.   Acute metabolic encephalopathy > Improving  She has baseline Dementia.   Presented with unresponsiveness.  Head CT negative for acute abnormality.   Likely due to hypercarbic respiratory failure and ICU delirium.  Pulled  out cortrak on 4/22.  Patient was agitated and restless was given Versed push Slp evaluation recommended dysphagia 1 diet with thin liquids.  Patient has on and off agitation requiring Versed push.   Type 2 DM:   Continue with SSI. No changes in meds.  Last Hb A1c is 6.0 last month.    Acute kidney injury: Resolved.    Pulmonary nodules: Incidental finding on  CT 06/12/2022 - 2 mm and 4 mm noncalcified left upper lobe lung nodules.  No follow-up needed if patient is low-risk (and has no known or suspected primary neoplasm).  Non-contrast chest CT can be considered in 12 months if patient is high-risk.  -Follow-up as outpatient   HTN (hypertension) Better better controlled. Continue Norvasc 5 mg daily,  coreg 3.125 mg BID.     Chronic diastolic heart failure:  She appears Euvolemic.  Continue to monitor.    Nutrition:  SLP on board, MBS DONE,  and recommended pureed/ dysphagia 1  diet.    Hypokalemia:  Replaced.Continue to monitor     RN Pressure Injury Documentation:     Pressure Injury 07/10/22 Nare Right;Medial Stage 2 -  Partial thickness loss of dermis presenting as a shallow open injury with a red, pink wound bed without slough. small, pink/red area due to bridle from Cortrak (Active)  07/10/22 1308  Location: Nare  Location Orientation: Right;Medial  Staging: Stage 2 -  Partial thickness loss of dermis presenting as a shallow open injury with a red, pink wound bed without slough.  Wound Description (Comments): small, pink/red area due to bridle from Cortrak  Present on Admission: No  Dressing Type None 07/16/22 0850  Continue with local Wound care   Disposition:  TOC looking into LTAC.      Malnutrition Type: Nutrition Problem: Severe Malnutrition Etiology: chronic illness (COPD, dementia)   Malnutrition Characteristics: Signs/Symptoms: severe fat depletion, severe muscle depletion     Nutrition Interventions: Interventions: Tube feeding   Estimated body mass index is 18.3 kg/m as calculated from the following:   Height as of this encounter: 5' (1.524 m).   Weight as of this encounter: 42.5 kg.   DVT prophylaxis: Lovenox Code Status: DNR Family Communication: No family at bed side. Disposition Plan:   Status is: Inpatient Remains inpatient appropriate because:  TOC on board for LTAC, not a candidate for  CIR TOC states 1 month after tracheostomy placement to look for SNF options.   Consultants:  Palliative care PCCM  Procedures: S/p trach  Antimicrobials: Anti-infectives (From admission, onward)    Start     Dose/Rate Route Frequency Ordered Stop   07/06/22 1000  cefTRIAXone (ROCEPHIN) 2 g in sodium chloride 0.9 % 100 mL IVPB        2 g 200 mL/hr over 30 Minutes Intravenous Every 24 hours 07/06/22 0848 07/08/22 0952   07/05/22 0200  piperacillin-tazobactam (ZOSYN) IVPB 3.375 g  Status:  Discontinued        3.375 g 12.5 mL/hr over 240 Minutes Intravenous Every 8 hours 07/04/22 1826 07/06/22 0848   07/04/22 1915  vancomycin (VANCOCIN) IVPB 1000 mg/200 mL premix  Status:  Discontinued        1,000 mg 200 mL/hr over 60 Minutes Intravenous Every 48 hours 07/04/22 1826 07/05/22 0827   07/04/22 1915  piperacillin-tazobactam (ZOSYN) IVPB 3.375 g        3.375 g 100 mL/hr over 30 Minutes Intravenous  Once 07/04/22 1826 07/04/22 1914   06/21/22 2300  piperacillin-tazobactam (ZOSYN) IVPB  3.375 g        3.375 g 12.5 mL/hr over 240 Minutes Intravenous Every 8 hours 06/21/22 1631 06/29/22 2009   06/21/22 1645  piperacillin-tazobactam (ZOSYN) IVPB 3.375 g  Status:  Discontinued        3.375 g 12.5 mL/hr over 240 Minutes Intravenous Every 8 hours 06/21/22 1630 06/21/22 1631   06/21/22 1645  piperacillin-tazobactam (ZOSYN) IVPB 3.375 g        3.375 g 100 mL/hr over 30 Minutes Intravenous STAT 06/21/22 1631 06/21/22 1900   06/14/22 1315  cefTRIAXone (ROCEPHIN) 2 g in sodium chloride 0.9 % 100 mL IVPB  Status:  Discontinued        2 g 200 mL/hr over 30 Minutes Intravenous Every 24 hours 06/14/22 1249 06/20/22 1415   06/12/22 2230  doxycycline (VIBRAMYCIN) 100 mg in sodium chloride 0.9 % 250 mL IVPB  Status:  Discontinued        100 mg 125 mL/hr over 120 Minutes Intravenous Every 12 hours 06/12/22 2139 06/14/22 1350      Subjective: Patient was seen and examined at bedside.  Overnight events  noted. Patient was lying comfortably on the bed, arousable. She remains in restraints probably to prevent removal of trach tube. She is awaiting LTAC placement.  Objective: Vitals:   07/20/22 0432 07/20/22 0740 07/20/22 0900 07/20/22 1213  BP: (!) 175/85 126/64  (!) 145/71  Pulse:  94  96  Resp:   (!) 23   Temp:  98.2 F (36.8 C)  98.1 F (36.7 C)  TempSrc:  Axillary  Axillary  SpO2:   100%   Weight:      Height:        Intake/Output Summary (Last 24 hours) at 07/20/2022 1409 Last data filed at 07/20/2022 1029 Gross per 24 hour  Intake 2291.05 ml  Output 1920 ml  Net 371.05 ml   Filed Weights   07/19/22 0500 07/19/22 0523 07/20/22 0322  Weight: 39.6 kg 39.6 kg 40.9 kg    Examination:  General exam: Appears comfortable, sick looking, deconditioned, trach tube noted. Respiratory system: CTA bilaterally, respiratory effort normal. RR 20 Cardiovascular system: S1-S2 heard, regular rate and rhythm, no murmur. Gastrointestinal system: Abdomen is soft, non tender, non distended, BS+ Central nervous system: Alert and oriented x 1. No focal neurological deficits. Extremities: Symmetric 5 x 5 power. Skin: No rashes, lesions or ulcers Psychiatry:  Mood & affect appropriate.     Data Reviewed: I have personally reviewed following labs and imaging studies  CBC: Recent Labs  Lab 07/15/22 0545 07/17/22 0555 07/19/22 0300 07/20/22 0206  WBC 10.3 10.4  --  6.9  NEUTROABS  --  7.2  --   --   HGB 12.5 12.7 12.6 12.6  HCT 39.4 40.4 38.8 40.1  MCV 96.1 96.7  --  97.1  PLT 368 355  --  305   Basic Metabolic Panel: Recent Labs  Lab 07/15/22 0545 07/16/22 1530 07/17/22 0555 07/19/22 0300 07/20/22 0206  NA 137 136 136 138 137  K 3.4* 3.4* 3.1* 3.2* 3.3*  CL 105 103 102 104 106  CO2 24 24 26 25 24   GLUCOSE 137* 121* 144* 100* 104*  BUN 18 15 11 14 13   CREATININE 0.78 0.74 0.71 0.69 0.71  CALCIUM 9.8 9.8 9.9 9.6 9.5  MG 2.2  --  2.2 1.9 2.0  PHOS  --   --   --  2.4*  2.5   GFR: Estimated Creatinine Clearance: 38 mL/min (by C-G  formula based on SCr of 0.71 mg/dL). Liver Function Tests: Recent Labs  Lab 07/17/22 0555  AST 27  ALT 35  ALKPHOS 102  BILITOT 0.4  PROT 6.4*  ALBUMIN 2.6*   No results for input(s): "LIPASE", "AMYLASE" in the last 168 hours. No results for input(s): "AMMONIA" in the last 168 hours. Coagulation Profile: No results for input(s): "INR", "PROTIME" in the last 168 hours. Cardiac Enzymes: No results for input(s): "CKTOTAL", "CKMB", "CKMBINDEX", "TROPONINI" in the last 168 hours. BNP (last 3 results) No results for input(s): "PROBNP" in the last 8760 hours. HbA1C: No results for input(s): "HGBA1C" in the last 72 hours. CBG: Recent Labs  Lab 07/14/22 2106 07/15/22 0021 07/15/22 0430 07/15/22 0827 07/15/22 1251  GLUCAP 184* 124* 124* 139* 118*   Lipid Profile: No results for input(s): "CHOL", "HDL", "LDLCALC", "TRIG", "CHOLHDL", "LDLDIRECT" in the last 72 hours. Thyroid Function Tests: No results for input(s): "TSH", "T4TOTAL", "FREET4", "T3FREE", "THYROIDAB" in the last 72 hours. Anemia Panel: No results for input(s): "VITAMINB12", "FOLATE", "FERRITIN", "TIBC", "IRON", "RETICCTPCT" in the last 72 hours. Sepsis Labs: No results for input(s): "PROCALCITON", "LATICACIDVEN" in the last 168 hours.  No results found for this or any previous visit (from the past 240 hour(s)).   Radiology Studies: No results found.  Scheduled Meds:  amLODipine  5 mg Oral Daily   arformoterol  15 mcg Nebulization BID   budesonide (PULMICORT) nebulizer solution  0.5 mg Nebulization BID   carvedilol  3.125 mg Oral BID WC   Chlorhexidine Gluconate Cloth  6 each Topical Daily   clonazePAM  0.5 mg Per Tube QHS   clonazePAM  0.5 mg Per Tube Daily   enoxaparin (LOVENOX) injection  30 mg Subcutaneous Daily   feeding supplement  237 mL Oral BID BM   nutrition supplement (JUVEN)  1 packet Oral BID BM   OLANZapine  5 mg Intravenous Once    pantoprazole  40 mg Oral Daily   revefenacin  175 mcg Nebulization Daily   sodium chloride flush  10-40 mL Intracatheter Q12H   Continuous Infusions:  sodium chloride 10 mL/hr at 07/01/22 0503   sodium chloride 250 mL (07/04/22 1842)   dextrose 5 % and 0.45% NaCl 75 mL/hr at 07/20/22 1029     LOS: 38 days    Time spent: 35 mins    Willeen Niece, MD [facility]   If 7PM-7AM, please contact night-coverage

## 2022-07-21 DIAGNOSIS — J9602 Acute respiratory failure with hypercapnia: Secondary | ICD-10-CM | POA: Diagnosis not present

## 2022-07-21 DIAGNOSIS — J9601 Acute respiratory failure with hypoxia: Secondary | ICD-10-CM | POA: Diagnosis not present

## 2022-07-21 LAB — CBC
HCT: 38.4 % (ref 36.0–46.0)
Hemoglobin: 12.7 g/dL (ref 12.0–15.0)
MCH: 31.3 pg (ref 26.0–34.0)
WBC: 6 10*3/uL (ref 4.0–10.5)

## 2022-07-21 LAB — BASIC METABOLIC PANEL
Anion gap: 9 (ref 5–15)
CO2: 24 mmol/L (ref 22–32)

## 2022-07-21 LAB — PHOSPHORUS: Phosphorus: 2.4 mg/dL — ABNORMAL LOW (ref 2.5–4.6)

## 2022-07-21 MED ORDER — HALOPERIDOL LACTATE 5 MG/ML IJ SOLN
1.0000 mg | Freq: Once | INTRAMUSCULAR | Status: AC
  Administered 2022-07-21: 1 mg via INTRAMUSCULAR
  Filled 2022-07-21: qty 1

## 2022-07-21 MED ORDER — POTASSIUM PHOSPHATES 15 MMOLE/5ML IV SOLN
30.0000 mmol | Freq: Once | INTRAVENOUS | Status: AC
  Administered 2022-07-21: 30 mmol via INTRAVENOUS
  Filled 2022-07-21: qty 10

## 2022-07-21 MED ORDER — HALOPERIDOL DECANOATE 100 MG/ML IM SOLN: 1.0000 mg | Freq: Once | INTRAMUSCULAR | Status: DC

## 2022-07-21 NOTE — Progress Notes (Addendum)
PROGRESS NOTE    Carla [name]  ZOX:[id number] DOB: 1945/07/05 DOA: 06/12/2022 PCP: Patient, [name]   Brief Narrative:  This 77 yo female smoker with hx of COPD from emphysema, Hypertension, Dementia, Anxiety, Depression, GERD, Primary hyperparathyroidism, Vit D deficiency, HLD, Osteoporosis , found by family unresponsive and brought to ER. Found to have acute on chronic hypercapnia on ABG. Intubated in ER. She was started on therapy for COPD exacerbation. PCCM consulted to assist with respiratory management.      Significant Events  3/20 Admitted after being found unresponsive, intubated on ED arrival  CT angio chest >> atherosclerosis, severe coronary calcification, severe LVH, mild centrilobular emphysema, 2 mm nodule LUL, 4 mm nodule LUL Blood culture  >> Haemophilus influenzae 3/22 Had oxygen desaturation and low Vt with SBT this morning. Echo 06/14/22 >> EF 60 to 65%, mod LVH, grade 1 DD, mild/mod AR 3/25 bronch for peak pressures, low tidal volumes with mucus cast at end of ETT, thick secretions caking tube 3/29 Bronched again with purulent secretions LLL with plugging and LLL atelectasis, some increase in O2 requirement on vent, zosyn started for VAP coverage. MRSA nare swab today negative.  3/30 Remains intubated with waxing and weaning agitation on fentanyl and precedex  3/31 Remains on vent with continued issues with agitation when sedation lightened  4/2 family meeting, made DNR, plans for one way extubation 4/5 4/5-no overnight events, plan is for one-way extubation today 4/6-family changes mind regarding one-way extubation, plan will be for tracheostomy 4/8 trach placed 4/10 failed overnight weaning trial, back on PRVC and sedation  was added. Subsequently sedation weaned and enteral agents were dc and or decreased.  4/11 failed psv w some distress. Fever. Abx added 4/12 cont efforts at psv. Most awake she has been in days   4/13 Tolerating PS.  4/15 Has been on ATC x  1:45 minutes, RR 40 and accessory muscle use, placing back on vent 4/18 Cortrak clogged and thus replaced. Still working on weaning completed CTx for PNA. 48 hrs on ATC  4/19 off vent x 72 hours so removed from room.  Planning on changing trach to cuffless.  Continuing to work with PMV.  Hypoglycemia adding D5 to maintenance fluids 4/20 transfer out of ICU  4/21 transfer to [facility].  4/26: Patient pulled tracheostomy tube.  Placed it back.   Assessment & Plan:   Principal Problem:   Acute respiratory failure with hypoxia and hypercapnia (HCC) Active Problems:   COPD with exacerbation (HCC)   Hyperglycemia   Acute metabolic encephalopathy   HTN (hypertension)   Tobacco abuse   Anxiety   Peripheral vascular disease (HCC)   Pulmonary nodules   Dementia (HCC)   Protein-calorie malnutrition, severe (HCC)   Urinary retention   Hypervolemia   Encephalopathy acute   Tracheostomy in place (HCC)   AKI (acute kidney injury) (HCC)   Hypernatremia   Fever   Acute respiratory failure with hypoxia (HCC)   Hyponatremia   Pressure injury of skin  Acute hypoxic and hypercapnic respiratory failure: S/p intubation on admission.  s/p tracheostomy , changed to trach collar and cuffless on 4/19.  Continue with brovana, pulmicort and yupelri.  She is more alert and on 5 lit of  oxygen.  PCCM managing trach care.   Acute metabolic encephalopathy > Improving  She has baseline Dementia.   Presented with unresponsiveness.  Head CT negative for acute abnormality.   Likely due to hypercarbic respiratory failure and ICU delirium.  Pulled  out cortrak on 4/22.  Patient was agitated and restless was given Versed push Slp evaluation recommended dysphagia 1 diet with thin liquids.  Patient has on and off agitation requiring Versed push.   Type 2 DM:   Continue with SSI. No changes in meds.  Last Hb A1c is 6.0 last month.    Acute kidney injury: Resolved.    Pulmonary nodules: Incidental finding on  CT 06/12/2022 - 2 mm and 4 mm noncalcified left upper lobe lung nodules.  No follow-up needed if patient is low-risk (and has no known or suspected primary neoplasm).  Non-contrast chest CT can be considered in 12 months if patient is high-risk.  -Follow-up as outpatient   HTN (hypertension) Better better controlled. Continue Norvasc 5 mg daily,  coreg 3.125 mg BID.     Chronic diastolic heart failure:  She appears Euvolemic.  Continue to monitor.    Nutrition:  SLP on board, MBS DONE,  and recommended pureed/ dysphagia 1  diet.    Hypokalemia:  Replaced.Continue to monitor     RN Pressure Injury Documentation:     Pressure Injury 07/10/22 Nare Right;Medial Stage 2 -  Partial thickness loss of dermis presenting as a shallow open injury with a red, pink wound bed without slough. small, pink/red area due to bridle from Cortrak (Active)  07/10/22 1610  Location: Nare  Location Orientation: Right;Medial  Staging: Stage 2 -  Partial thickness loss of dermis presenting as a shallow open injury with a red, pink wound bed without slough.  Wound Description (Comments): small, pink/red area due to bridle from Cortrak  Present on Admission: No  Dressing Type None 07/16/22 0850  Continue with local Wound care   Disposition:  TOC looking into LTAC.      Malnutrition Type: Nutrition Problem: Severe Malnutrition Etiology: chronic illness (COPD, dementia)   Malnutrition Characteristics: Signs/Symptoms: severe fat depletion, severe muscle depletion     Nutrition Interventions: Interventions: Tube feeding   Estimated body mass index is 18.3 kg/m as calculated from the following:   Height as of this encounter: 5' (1.524 m).   Weight as of this encounter: 42.5 kg.   DVT prophylaxis: Lovenox Code Status: DNR Family Communication: No family at bed side. Disposition Plan:   Status is: Inpatient Remains inpatient appropriate because:  TOC on board for LTAC, not a candidate for  CIR TOC states 1 month after tracheostomy placement to look for SNF options. Difficult placement due to tracheostomy status.   Consultants:  Palliative care PCCM  Procedures: S/p trach  Antimicrobials: Anti-infectives (From admission, onward)    Start     Dose/Rate Route Frequency Ordered Stop   07/06/22 1000  cefTRIAXone (ROCEPHIN) 2 g in sodium chloride 0.9 % 100 mL IVPB        2 g 200 mL/hr over 30 Minutes Intravenous Every 24 hours 07/06/22 0848 07/08/22 0952   07/05/22 0200  piperacillin-tazobactam (ZOSYN) IVPB 3.375 g  Status:  Discontinued        3.375 g 12.5 mL/hr over 240 Minutes Intravenous Every 8 hours 07/04/22 1826 07/06/22 0848   07/04/22 1915  vancomycin (VANCOCIN) IVPB 1000 mg/200 mL premix  Status:  Discontinued        1,000 mg 200 mL/hr over 60 Minutes Intravenous Every 48 hours 07/04/22 1826 07/05/22 0827   07/04/22 1915  piperacillin-tazobactam (ZOSYN) IVPB 3.375 g        3.375 g 100 mL/hr over 30 Minutes Intravenous  Once 07/04/22 1826 07/04/22 1914  06/21/22 2300  piperacillin-tazobactam (ZOSYN) IVPB 3.375 g        3.375 g 12.5 mL/hr over 240 Minutes Intravenous Every 8 hours 06/21/22 1631 06/29/22 2009   06/21/22 1645  piperacillin-tazobactam (ZOSYN) IVPB 3.375 g  Status:  Discontinued        3.375 g 12.5 mL/hr over 240 Minutes Intravenous Every 8 hours 06/21/22 1630 06/21/22 1631   06/21/22 1645  piperacillin-tazobactam (ZOSYN) IVPB 3.375 g        3.375 g 100 mL/hr over 30 Minutes Intravenous STAT 06/21/22 1631 06/21/22 1900   06/14/22 1315  cefTRIAXone (ROCEPHIN) 2 g in sodium chloride 0.9 % 100 mL IVPB  Status:  Discontinued        2 g 200 mL/hr over 30 Minutes Intravenous Every 24 hours 06/14/22 1249 06/20/22 1415   06/12/22 2230  doxycycline (VIBRAMYCIN) 100 mg in sodium chloride 0.9 % 250 mL IVPB  Status:  Discontinued        100 mg 125 mL/hr over 120 Minutes Intravenous Every 12 hours 06/12/22 2139 06/14/22 1350      Subjective: Patient was  seen and examined at bedside.  Overnight events noted. Patient was lying comfortably on the bed, arousable. She remains in restraints probably to prevent removal of trach tube. She is awaiting LTAC placement.  Objective: Vitals:   07/21/22 0806 07/21/22 0809 07/21/22 1118 07/21/22 1124  BP:  (!) 140/73  (!) 145/65  Pulse: 100 99 (!) 109   Resp: 19 (!) 31 17 17   Temp:  97.8 F (36.6 C)  98.4 F (36.9 C)  TempSrc:  Oral  Oral  SpO2: 100% 100% 98% 99%  Weight:      Height:        Intake/Output Summary (Last 24 hours) at 07/21/2022 1224 Last data filed at 07/21/2022 1100 Gross per 24 hour  Intake 1610.73 ml  Output 2390 ml  Net -779.27 ml   Filed Weights   07/19/22 0500 07/19/22 0523 07/20/22 0322  Weight: 39.6 kg 39.6 kg 40.9 kg    Examination:  General exam: Appears comfortable, deconditioned, sick looking, trach noted. Respiratory system: CTA bilaterally, respiratory effort normal. RR 18 Cardiovascular system: S1-S2 heard, regular rate and rhythm, no murmur. Gastrointestinal system: Abdomen is soft, non tender, non distended, BS+ Central nervous system: Alert and oriented x 1. No focal neurological deficits. Extremities: No edema, no cyanosis, no clubbing. Skin: No rashes, lesions or ulcers Psychiatry:  Mood & affect appropriate.     Data Reviewed: I have personally reviewed following labs and imaging studies  CBC: Recent Labs  Lab 07/15/22 0545 07/17/22 0555 07/19/22 0300 07/20/22 0206 07/21/22 0219  WBC 10.3 10.4  --  6.9 6.0  NEUTROABS  --  7.2  --   --   --   HGB 12.5 12.7 12.6 12.6 12.7  HCT 39.4 40.4 38.8 40.1 38.4  MCV 96.1 96.7  --  97.1 94.6  PLT 368 355  --  305 282   Basic Metabolic Panel: Recent Labs  Lab 07/15/22 0545 07/16/22 1530 07/17/22 0555 07/19/22 0300 07/20/22 0206 07/21/22 0219  NA 137 136 136 138 137 138  K 3.4* 3.4* 3.1* 3.2* 3.3* 3.1*  CL 105 103 102 104 106 105  CO2 24 24 26 25 24 24   GLUCOSE 137* 121* 144* 100* 104*  127*  BUN 18 15 11 14 13 10   CREATININE 0.78 0.74 0.71 0.69 0.71 0.73  CALCIUM 9.8 9.8 9.9 9.6 9.5 9.6  MG 2.2  --  2.2 1.9 2.0 2.0  PHOS  --   --   --  2.4* 2.5 2.4*   GFR: Estimated Creatinine Clearance: 38 mL/min (by C-G formula based on SCr of 0.73 mg/dL). Liver Function Tests: Recent Labs  Lab 07/17/22 0555  AST 27  ALT 35  ALKPHOS 102  BILITOT 0.4  PROT 6.4*  ALBUMIN 2.6*   No results for input(s): "LIPASE", "AMYLASE" in the last 168 hours. No results for input(s): "AMMONIA" in the last 168 hours. Coagulation Profile: No results for input(s): "INR", "PROTIME" in the last 168 hours. Cardiac Enzymes: No results for input(s): "CKTOTAL", "CKMB", "CKMBINDEX", "TROPONINI" in the last 168 hours. BNP (last 3 results) No results for input(s): "PROBNP" in the last 8760 hours. HbA1C: No results for input(s): "HGBA1C" in the last 72 hours. CBG: Recent Labs  Lab 07/14/22 2106 07/15/22 0021 07/15/22 0430 07/15/22 0827 07/15/22 1251  GLUCAP 184* 124* 124* 139* 118*   Lipid Profile: No results for input(s): "CHOL", "HDL", "LDLCALC", "TRIG", "CHOLHDL", "LDLDIRECT" in the last 72 hours. Thyroid Function Tests: No results for input(s): "TSH", "T4TOTAL", "FREET4", "T3FREE", "THYROIDAB" in the last 72 hours. Anemia Panel: No results for input(s): "VITAMINB12", "FOLATE", "FERRITIN", "TIBC", "IRON", "RETICCTPCT" in the last 72 hours. Sepsis Labs: No results for input(s): "PROCALCITON", "LATICACIDVEN" in the last 168 hours.  No results found for this or any previous visit (from the past 240 hour(s)).   Radiology Studies: No results found.  Scheduled Meds:  amLODipine  5 mg Oral Daily   arformoterol  15 mcg Nebulization BID   budesonide (PULMICORT) nebulizer solution  0.5 mg Nebulization BID   carvedilol  3.125 mg Oral BID WC   Chlorhexidine Gluconate Cloth  6 each Topical Daily   clonazePAM  0.5 mg Per Tube QHS   clonazePAM  0.5 mg Per Tube Daily   enoxaparin (LOVENOX)  injection  30 mg Subcutaneous Daily   feeding supplement  237 mL Oral BID BM   nutrition supplement (JUVEN)  1 packet Oral BID BM   OLANZapine  5 mg Intravenous Once   pantoprazole  40 mg Oral Daily   revefenacin  175 mcg Nebulization Daily   sodium chloride flush  10-40 mL Intracatheter Q12H   Continuous Infusions:  sodium chloride 10 mL/hr at 07/01/22 0503   sodium chloride 250 mL (07/04/22 1842)   dextrose 5 % and 0.45% NaCl 75 mL/hr at 07/21/22 0349   potassium PHOSPHATE IVPB (in mmol) 30 mmol (07/21/22 1056)     LOS: 39 days    Time spent: 35 mins    Willeen Niece, MD [facility]   If 7PM-7AM, please contact night-coverage

## 2022-07-22 ENCOUNTER — Other Ambulatory Visit: Payer: Medicare HMO

## 2022-07-22 DIAGNOSIS — J9602 Acute respiratory failure with hypercapnia: Secondary | ICD-10-CM | POA: Diagnosis not present

## 2022-07-22 DIAGNOSIS — J9601 Acute respiratory failure with hypoxia: Secondary | ICD-10-CM | POA: Diagnosis not present

## 2022-07-22 MED ORDER — ONDANSETRON HCL 4 MG/2ML IJ SOLN: 4.0000 mg | Freq: Four times a day (QID) | INTRAMUSCULAR | Status: DC | PRN

## 2022-07-22 MED ORDER — CLONAZEPAM 0.5 MG PO TABS
0.5000 mg | ORAL_TABLET | Freq: Every day | ORAL | Status: DC
  Administered 2022-07-22 – 2022-08-01 (×11): 0.5 mg via ORAL
  Filled 2022-07-22 (×10): qty 1

## 2022-07-22 MED ORDER — HALOPERIDOL DECANOATE 100 MG/ML IM SOLN: 1.0000 mg | Freq: Once | INTRAMUSCULAR | Status: DC

## 2022-07-22 MED ORDER — OXYCODONE HCL 5 MG PO TABS: 5.0000 mg | ORAL_TABLET | Freq: Three times a day (TID) | ORAL | Status: DC | PRN

## 2022-07-22 MED ORDER — ACETAMINOPHEN 325 MG PO TABS
650.0000 mg | ORAL_TABLET | Freq: Four times a day (QID) | ORAL | Status: DC | PRN
  Administered 2022-07-25: 650 mg via ORAL
  Filled 2022-07-22 (×2): qty 2

## 2022-07-22 MED ORDER — HALOPERIDOL LACTATE 5 MG/ML IJ SOLN: 1.0000 mg | INTRAMUSCULAR | Status: DC

## 2022-07-22 MED ORDER — HALOPERIDOL LACTATE 5 MG/ML IJ SOLN
1.0000 mg | Freq: Once | INTRAMUSCULAR | Status: AC
  Administered 2022-07-22: 1 mg via INTRAMUSCULAR
  Filled 2022-07-22: qty 1

## 2022-07-22 MED ORDER — CLONAZEPAM 0.5 MG PO TABS
0.5000 mg | ORAL_TABLET | Freq: Every day | ORAL | Status: DC
  Administered 2022-07-26 – 2022-08-02 (×8): 0.5 mg via ORAL
  Filled 2022-07-22 (×10): qty 1

## 2022-07-22 MED ORDER — ONDANSETRON HCL 4 MG PO TABS: 4.0000 mg | ORAL_TABLET | Freq: Four times a day (QID) | ORAL | Status: DC | PRN

## 2022-07-22 MED ORDER — HALOPERIDOL 0.5 MG PO TABS
0.5000 mg | ORAL_TABLET | Freq: Once | ORAL | Status: AC
  Filled 2022-07-22: qty 1

## 2022-07-22 MED ORDER — SENNA 8.6 MG PO TABS
1.0000 | ORAL_TABLET | Freq: Every day | ORAL | Status: DC | PRN
  Administered 2022-08-03: 8.6 mg via ORAL
  Filled 2022-07-22 (×2): qty 1

## 2022-07-22 MED ORDER — ACETAMINOPHEN 650 MG RE SUPP: 650.0000 mg | Freq: Four times a day (QID) | RECTAL | Status: DC | PRN

## 2022-07-22 MED ORDER — POLYETHYLENE GLYCOL 3350 17 G PO PACK: 17.0000 g | PACK | Freq: Every day | ORAL | Status: DC | PRN

## 2022-07-22 NOTE — Progress Notes (Addendum)
PROGRESS NOTE    Carla [name]  WUJ:[id number] DOB: [birth year] DOA: 06/12/2022 PCP: Patient, [name]   Brief Narrative:  This 77 yo female smoker with hx of COPD from emphysema, Hypertension, Dementia, Anxiety, Depression, GERD, Primary hyperparathyroidism, Vit D deficiency, HLD, Osteoporosis , found by family unresponsive and brought to ER. Found to have acute on chronic hypercapnia on ABG. Intubated in ER. She was started on therapy for COPD exacerbation. PCCM consulted to assist with respiratory management.      Significant Events  3/20 Admitted after being found unresponsive, intubated on ED arrival  CT angio chest >> atherosclerosis, severe coronary calcification, severe LVH, mild centrilobular emphysema, 2 mm nodule LUL, 4 mm nodule LUL Blood culture  >> Haemophilus influenzae 3/22 Had oxygen desaturation and low Vt with SBT this morning. Echo 06/14/22 >> EF 60 to 65%, mod LVH, grade 1 DD, mild/mod AR 3/25 bronch for peak pressures, low tidal volumes with mucus cast at end of ETT, thick secretions caking tube 3/29 Bronched again with purulent secretions LLL with plugging and LLL atelectasis, some increase in O2 requirement on vent, zosyn started for VAP coverage. MRSA nare swab today negative.  3/30 Remains intubated with waxing and weaning agitation on fentanyl and precedex  3/31 Remains on vent with continued issues with agitation when sedation lightened  4/2 family meeting, made DNR, plans for one way extubation 4/5 4/5-no overnight events, plan is for one-way extubation today 4/6-family changes mind regarding one-way extubation, plan will be for tracheostomy 4/8 trach placed 4/10 failed overnight weaning trial, back on PRVC and sedation  was added. Subsequently sedation weaned and enteral agents were dc and or decreased.  4/11 failed psv w some distress. Fever. Abx added 4/12 cont efforts at psv. Most awake she has been in days   4/13 Tolerating PS.  4/15 Has been on ATC x  1:45 minutes, RR 40 and accessory muscle use, placing back on vent 4/18 Cortrak clogged and thus replaced. Still working on weaning completed CTx for PNA. 48 hrs on ATC  4/19 off vent x 72 hours so removed from room.  Planning on changing trach to cuffless.  Continuing to work with PMV.  Hypoglycemia adding D5 to maintenance fluids 4/20 transfer out of ICU  4/21 transfer to [facility].  4/26: Patient pulled tracheostomy tube.  Placed it back. 4/28: Patient pulled PICC line, PIV inserted, Soft restraints.   Assessment & Plan:   Principal Problem:   Acute respiratory failure with hypoxia and hypercapnia (HCC) Active Problems:   COPD with exacerbation (HCC)   Hyperglycemia   Acute metabolic encephalopathy   HTN (hypertension)   Tobacco abuse   Anxiety   Peripheral vascular disease (HCC)   Pulmonary nodules   Dementia (HCC)   Protein-calorie malnutrition, severe (HCC)   Urinary retention   Hypervolemia   Encephalopathy acute   Tracheostomy in place (HCC)   AKI (acute kidney injury) (HCC)   Hypernatremia   Fever   Acute respiratory failure with hypoxia (HCC)   Hyponatremia   Pressure injury of skin  Acute hypoxic and hypercapnic respiratory failure: S/p intubation on admission.  s/p tracheostomy , changed to trach collar and cuffless on 4/19.  Continue with brovana, pulmicort and yupelri.  She is now more alert and on 5 lit of [place] oxygen.  PCCM managing trach care.   Acute metabolic encephalopathy > Improving  She has baseline Dementia.   Presented with unresponsiveness.  Head CT negative for acute abnormality.   Likely  due to hypercarbic respiratory failure and ICU delirium.  Pulled out cortrak on 4/22.  Patient was agitated and restless was given Versed push Slp evaluation recommended dysphagia 1 diet with thin liquids.  Patient has on and off agitation requiring Versed push.   Type 2 DM:   Continue with SSI. No changes in meds.  Last Hb A1c is 6.0 last month.    Acute  kidney injury: Resolved.    Pulmonary nodules: Incidental finding on CT 06/12/2022 - 2 mm and 4 mm noncalcified left upper lobe lung nodules.  No follow-up needed if patient is low-risk (and has no known or suspected primary neoplasm).  Non-contrast chest CT can be considered in 12 months if patient is high-risk.  -Follow-up as outpatient   HTN (hypertension) Better better controlled. Continue Norvasc 5 mg daily,  coreg 3.125 mg BID.     Chronic diastolic heart failure:  She appears Euvolemic.  Continue to monitor.    Nutrition:  SLP on board, MBS DONE,  and recommended pureed/ dysphagia 1  diet.    Hypokalemia:  Replaced.Continue to monitor     RN Pressure Injury Documentation:     Pressure Injury 07/10/22 Nare Right;Medial Stage 2 -  Partial thickness loss of dermis presenting as a shallow open injury with a red, pink wound bed without slough. small, pink/red area due to bridle from Cortrak (Active)  07/10/22 9604  Location: Nare  Location Orientation: Right;Medial  Staging: Stage 2 -  Partial thickness loss of dermis presenting as a shallow open injury with a red, pink wound bed without slough.  Wound Description (Comments): small, pink/red area due to bridle from Cortrak  Present on Admission: No  Dressing Type None 07/16/22 0850  Continue with local Wound care   Disposition:  TOC looking into LTAC.      Malnutrition Type: Nutrition Problem: Severe Malnutrition Etiology: chronic illness (COPD, dementia)   Malnutrition Characteristics: Signs/Symptoms: severe fat depletion, severe muscle depletion     Nutrition Interventions: Interventions: Tube feeding   Estimated body mass index is 18.3 kg/m as calculated from the following:   Height as of this encounter: 5' (1.524 m).   Weight as of this encounter: 42.5 kg.   DVT prophylaxis: Lovenox Code Status: DNR Family Communication: No family at bed side. Disposition Plan:   Status is: Inpatient Remains  inpatient appropriate because:  TOC on board for LTAC, not a candidate for CIR TOC states 1 month after tracheostomy placement to look for SNF options. Difficult placement due to tracheostomy status.   Consultants:  Palliative care PCCM  Procedures: S/p trach  Antimicrobials: Anti-infectives (From admission, onward)    Start     Dose/Rate Route Frequency Ordered Stop   07/06/22 1000  cefTRIAXone (ROCEPHIN) 2 g in sodium chloride 0.9 % 100 mL IVPB        2 g 200 mL/hr over 30 Minutes Intravenous Every 24 hours 07/06/22 0848 07/08/22 0952   07/05/22 0200  piperacillin-tazobactam (ZOSYN) IVPB 3.375 g  Status:  Discontinued        3.375 g 12.5 mL/hr over 240 Minutes Intravenous Every 8 hours 07/04/22 1826 07/06/22 0848   07/04/22 1915  vancomycin (VANCOCIN) IVPB 1000 mg/200 mL premix  Status:  Discontinued        1,000 mg 200 mL/hr over 60 Minutes Intravenous Every 48 hours 07/04/22 1826 07/05/22 0827   07/04/22 1915  piperacillin-tazobactam (ZOSYN) IVPB 3.375 g        3.375 g 100 mL/hr over 30  Minutes Intravenous  Once 07/04/22 1826 07/04/22 1914   06/21/22 2300  piperacillin-tazobactam (ZOSYN) IVPB 3.375 g        3.375 g 12.5 mL/hr over 240 Minutes Intravenous Every 8 hours 06/21/22 1631 06/29/22 2009   06/21/22 1645  piperacillin-tazobactam (ZOSYN) IVPB 3.375 g  Status:  Discontinued        3.375 g 12.5 mL/hr over 240 Minutes Intravenous Every 8 hours 06/21/22 1630 06/21/22 1631   06/21/22 1645  piperacillin-tazobactam (ZOSYN) IVPB 3.375 g        3.375 g 100 mL/hr over 30 Minutes Intravenous STAT 06/21/22 1631 06/21/22 1900   06/14/22 1315  cefTRIAXone (ROCEPHIN) 2 g in sodium chloride 0.9 % 100 mL IVPB  Status:  Discontinued        2 g 200 mL/hr over 30 Minutes Intravenous Every 24 hours 06/14/22 1249 06/20/22 1415   06/12/22 2230  doxycycline (VIBRAMYCIN) 100 mg in sodium chloride 0.9 % 250 mL IVPB  Status:  Discontinued        100 mg 125 mL/hr over 120 Minutes Intravenous  Every 12 hours 06/12/22 2139 06/14/22 1350      Subjective: Patient was seen and examined at bedside.  Overnight events noted. Patient is lying comfortably on the bed, arousable.  She pulled PICC line yesterday. She remains in restraints probably to prevent removal of trach tube. She is awaiting LTAC placement.  Objective: Vitals:   07/22/22 0808 07/22/22 0823 07/22/22 1128 07/22/22 1228  BP: (!) 147/56   131/63  Pulse: 80 81 88 94  Resp: 17 16 17 19   Temp: 98.2 F (36.8 C)   98.2 F (36.8 C)  TempSrc: Oral   Oral  SpO2: 100% 100% 93% 96%  Weight:      Height:        Intake/Output Summary (Last 24 hours) at 07/22/2022 1336 Last data filed at 07/22/2022 0345 Gross per 24 hour  Intake 1470.34 ml  Output 1200 ml  Net 270.34 ml   Filed Weights   07/19/22 0523 07/20/22 0322 07/22/22 0345  Weight: 39.6 kg 40.9 kg 42.7 kg    Examination:  General exam: Appears comfortable, deconditioned, sick looking, trach noted. Respiratory system: CTA bilaterally, respiratory effort normal. RR 14 Cardiovascular system: S1-S2 heard, regular rate and rhythm, no murmur. Gastrointestinal system: Abdomen is soft, non tender, non distended, BS+ Central nervous system: Alert and oriented x 1. No focal neurological deficits. Extremities: No edema, no cyanosis, no clubbing. Skin: No rashes, lesions or ulcers Psychiatry:  Mood & affect appropriate.     Data Reviewed: I have personally reviewed following labs and imaging studies  CBC: Recent Labs  Lab 07/17/22 0555 07/19/22 0300 07/20/22 0206 07/21/22 0219  WBC 10.4  --  6.9 6.0  NEUTROABS 7.2  --   --   --   HGB 12.7 12.6 12.6 12.7  HCT 40.4 38.8 40.1 38.4  MCV 96.7  --  97.1 94.6  PLT 355  --  305 282   Basic Metabolic Panel: Recent Labs  Lab 07/16/22 1530 07/17/22 0555 07/19/22 0300 07/20/22 0206 07/21/22 0219  NA 136 136 138 137 138  K 3.4* 3.1* 3.2* 3.3* 3.1*  CL 103 102 104 106 105  CO2 24 26 25 24 24   GLUCOSE 121*  144* 100* 104* 127*  BUN 15 11 14 13 10   CREATININE 0.74 0.71 0.69 0.71 0.73  CALCIUM 9.8 9.9 9.6 9.5 9.6  MG  --  2.2 1.9 2.0 2.0  PHOS  --   --  2.4* 2.5 2.4*   GFR: Estimated Creatinine Clearance: 39.7 mL/min (by C-G formula based on SCr of 0.73 mg/dL). Liver Function Tests: Recent Labs  Lab 07/17/22 0555  AST 27  ALT 35  ALKPHOS 102  BILITOT 0.4  PROT 6.4*  ALBUMIN 2.6*   No results for input(s): "LIPASE", "AMYLASE" in the last 168 hours. No results for input(s): "AMMONIA" in the last 168 hours. Coagulation Profile: No results for input(s): "INR", "PROTIME" in the last 168 hours. Cardiac Enzymes: No results for input(s): "CKTOTAL", "CKMB", "CKMBINDEX", "TROPONINI" in the last 168 hours. BNP (last 3 results) No results for input(s): "PROBNP" in the last 8760 hours. HbA1C: No results for input(s): "HGBA1C" in the last 72 hours. CBG: No results for input(s): "GLUCAP" in the last 168 hours.  Lipid Profile: No results for input(s): "CHOL", "HDL", "LDLCALC", "TRIG", "CHOLHDL", "LDLDIRECT" in the last 72 hours. Thyroid Function Tests: No results for input(s): "TSH", "T4TOTAL", "FREET4", "T3FREE", "THYROIDAB" in the last 72 hours. Anemia Panel: No results for input(s): "VITAMINB12", "FOLATE", "FERRITIN", "TIBC", "IRON", "RETICCTPCT" in the last 72 hours. Sepsis Labs: No results for input(s): "PROCALCITON", "LATICACIDVEN" in the last 168 hours.  No results found for this or any previous visit (from the past 240 hour(s)).   Radiology Studies: [place] EKG SITE RITE  Result Date: 07/22/2022 If Site Rite image not attached, placement could not be confirmed due to current cardiac rhythm.   Scheduled Meds:  amLODipine  5 mg Oral Daily   arformoterol  15 mcg Nebulization BID   budesonide (PULMICORT) nebulizer solution  0.5 mg Nebulization BID   carvedilol  3.125 mg Oral BID WC   Chlorhexidine Gluconate Cloth  6 each Topical Daily   clonazePAM  0.5 mg Per Tube QHS   clonazePAM   0.5 mg Per Tube Daily   enoxaparin (LOVENOX) injection  30 mg Subcutaneous Daily   feeding supplement  237 mL Oral BID BM   nutrition supplement (JUVEN)  1 packet Oral BID BM   OLANZapine  5 mg Intravenous Once   pantoprazole  40 mg Oral Daily   revefenacin  175 mcg Nebulization Daily   sodium chloride flush  10-40 mL Intracatheter Q12H   Continuous Infusions:  sodium chloride 10 mL/hr at 07/01/22 0503   dextrose 5 % and 0.45% NaCl Stopped (07/21/22 1900)     LOS: 40 days    Time spent: 35 mins    Willeen Niece, MD [facility]   If 7PM-7AM, please contact night-coverage

## 2022-07-22 NOTE — TOC Progression Note (Addendum)
     Transition of Care [facility]) - Progression Note      Patient Details  Name: Carla [name] MRN: [id number] Date of Birth: May 15, 1945   [facility] CM/SW Contact  Delilah Shan, LCSWA Phone Number: 07/22/2022, 1:39 PM   Clinical Narrative:      CSW tried to call patients son Loraine Leriche to introduce self and discuss  dc plan for possible trach/SNF placement for patient closer to patient being medically ready for dc. CSW unable to leave voicemail. CSW will try back this afternoon.Janina Mayo will need to be 44 days old before CSW can fax out if patients son agreeable to trach SNF placement and on 28% trach collar for a minimum of 48 hours before facility will accept. CSW will continue to follow.   Expected Discharge Plan: Skilled Nursing Facility Barriers to Discharge: Continued Medical Work up   Expected Discharge Plan and Services Discharge Planning Services: CM Consult Post Acute Care Choice: Long Term Acute Care (LTAC)                           Social Determinants of Health (SDOH) Interventions   Readmission Risk Interventions

## 2022-07-22 NOTE — Progress Notes (Addendum)
Physical Therapy Treatment Patient Details Name:  C  MRN:  DOB:  Today's Date: 07/22/2022   History of Present Illness 77 yo female admitted 3/20 unresponsive from home with acute respiratory failure with hypoxia and hypercapnia and metabolic encephalopathy. Intubated 3/20. trach 4/8. PMhx: emphysema, tobacco abuse, PVD, HTN, prediabetes, anxiety, dementia    PT Comments    Pt with good improvement since last visit. Pt now talking with PMV on. Able to stand with walker and take a few side steps. Remains confused but better able to follow commands and participate. Anticipate next visit will be able to amb with second person to assist.    Recommendations for follow up therapy are one component of a multi-disciplinary discharge planning process, led by the attending physician.  Recommendations may be updated based on patient status, additional functional criteria and insurance authorization.  Follow Up Recommendations  Can patient physically be transported by private vehicle: No    Assistance Recommended at Discharge Frequent or constant Supervision/Assistance  Patient can return home with the following Two people to help with walking and/or transfers;Two people to help with bathing/dressing/bathroom;Direct supervision/assist for medications management;Assistance with cooking/housework;Assist for transportation   Equipment Recommendations  Hospital bed;Wheelchair (measurements PT);Wheelchair cushion (measurements PT)    Recommendations for Other Services       Precautions / Restrictions Precautions Precautions: Fall Precaution Comments: trach collar     Mobility  Bed Mobility Overal bed mobility: Needs Assistance Bed Mobility: Supine to Sit, Sit to Supine     Supine to sit: HOB elevated, Min assist Sit to supine: Min assist   General bed mobility comments: Assist to let pt pull up with my hand into sitting. Assist to initiate return of trunk to  supine    Transfers Overall transfer level: Needs assistance Equipment used: Rolling walker (2 wheels) Transfers: Sit to/from Stand Sit to Stand: Min assist, Mod assist           General transfer comment: Initial stand with walker mod assist to power up and for balance. Subsequent stand with min assist.    Ambulation/Gait             Pre-gait activities: Sidestepped up side of bed 2' with walker with min assist     Stairs             Wheelchair Mobility    Modified Rankin (Stroke Patients Only)       Balance Overall balance assessment: Needs assistance Sitting-balance support: Feet supported, Bilateral upper extremity supported Sitting balance-Leahy Scale: Poor Sitting balance - Comments: Intermittent UE support   Standing balance support: Bilateral upper extremity supported Standing balance-Leahy Scale: Poor Standing balance comment: walker and min assist for static standing                            Cognition Arousal/Alertness: Awake/alert Behavior During Therapy: Impulsive Overall Cognitive Status: Impaired/Different from baseline Area of Impairment: Attention, Following commands, Problem solving, Orientation, Memory, Safety/judgement, Awareness                 Orientation Level: Disoriented to, Place, Time, Situation Current Attention Level: Sustained Memory: Decreased short-term memory, Decreased recall of precautions Following Commands: Follows one step commands inconsistently, Follows one step commands with increased time Safety/Judgement: Decreased awareness of safety, Decreased awareness of deficits Awareness: Intellectual Problem Solving: Requires verbal cues, Requires tactile cues, Difficulty sequencing, Decreased initiation, Slow processing General Comments: Following commands more consistently and not requiring multimodal   cues        Exercises      General Comments        Pertinent Vitals/Pain Pain  Assessment Pain Assessment: Faces Faces Pain Scale: No hurt    Home Living                          Prior Function            PT Goals (current goals can now be found in the care plan section) Acute Rehab PT Goals Patient Stated Goal: not stated Progress towards PT goals: Progressing toward goals    Frequency    Min 1X/week      PT Plan Discharge plan needs to be updated    Co-evaluation              AM-PAC PT "6 Clicks" Mobility   Outcome Measure  Help needed turning from your back to your side while in a flat bed without using bedrails?: A Little Help needed moving from lying on your back to sitting on the side of a flat bed without using bedrails?: A Little Help needed moving to and from a bed to a chair (including a wheelchair)?: Total Help needed standing up from a chair using your arms (e.g., wheelchair or bedside chair)?: A Lot Help needed to walk in hospital room?: Total Help needed climbing 3-5 steps with a railing? : Total 6 Click Score: 11    End of Session Equipment Utilized During Treatment: Gait belt;Oxygen Activity Tolerance: Patient tolerated treatment well Patient left: in bed;with call bell/phone within reach;with bed alarm set Nurse Communication: Mobility status PT Visit Diagnosis: Other abnormalities of gait and mobility (R26.89);Muscle weakness (generalized) (M62.81);Difficulty in walking, not elsewhere classified (R26.2)     Time: 1544-1607 PT Time Calculation (min) (ACUTE ONLY): 23 min  Charges:  $Therapeutic Activity: 23-37 mins                     Trevaughn Schear PT Acute Rehabilitation Services Office [phone]    Niamh Rada W [place]cok 07/22/2022, 5:31 PM   

## 2022-07-22 NOTE — Progress Notes (Incomplete Revision)
Speech Language Pathology Treatment: Dysphagia  Patient Details Name: Carla [name] MRN: [id number] DOB: March 20, 1946 Today's Date: 07/22/2022 Time: 4098-1191 SLP Time Calculation (min) (ACUTE ONLY): 28 min   Assessment / Plan / Recommendation Clinical Impression   Carla [name] is doing well tolerating her PMV.  Her mental status does not allow her to place/remove the valve - she has no awareness that it is present. However, respiratory status has been stable when it's in place. Speech is intelligible; volume is low, and she needs verbal cues to speak loudly. Recommend that she use the valve all waking hours. Sign over bed updated, orders changed, D/W nursing.   Please consider changing trach to uncuffed.   She is also making improvements with eating/swallowing. Today she was able to attend to graham crackers, masticating effectively with no oral residue, no coughing or concerns for safety with liquids or solids. Cues were needed primarily to sustain attention to self feeding (visual/verbal).   Recommend advancing diet to dysphagia 2, thin liquids; will need help with tray set-up and supervision. SLP will follow.      HPI: Carla [name] is a 77 yo female who was found by family unresponsive and brought to ER 06/12/22.  Found to have acute on chronic hypercapnia on ABG.  Intubated in ER and unable to wean from vent.  Initial plans for one way extubation, but with change in goals. Tracheostomy placed 4/8.  Difficulty weaning from vent, but tolerating trach collar for 48 hours as of 4/18. Pt with with hx COPD from emphysema, tobacco abuse, peripheral vascular disease, hypertension, prediabetes, anxiety, baseline dementia.         SLP Plan   Continue with current plan of care      Recommendations for follow up therapy are one component of a multi-disciplinary discharge planning process, led by the attending physician.  Recommendations may be updated based on patient status, additional functional  criteria and insurance authorization.     Recommendations   Diet recommendations: Dysphagia 2 (fine chop) Liquids provided via: Cup;Straw Medication Administration: Whole meds with puree Supervision: Patient able to self feed;Staff to assist with self feeding Compensations: Minimize environmental distractions Postural Changes and/or Swallow Maneuvers: Seated upright 90 degrees         Patient may use Passy-Muir Speech Valve: Intermittently with supervision PMSV Supervision: Full MD: Please consider changing trach tube to : Cuffless               Oral care BID Frequent or constant Supervision/Assistance Dysphagia, oropharyngeal phase (R13.12) Continue with current plan of care      Carla Denio L. Samson Frederic, MA CCC/SLP Clinical Specialist - Acute Care SLP Acute Rehabilitation Services Office number [phone]   Carla [name] Carla [name]

## 2022-07-22 NOTE — Progress Notes (Addendum)
 Speech Language Pathology Treatment: Dysphagia  Patient Details Name: Carla [name] MRN: [id number] DOB: March 20, 1946 Today's Date: 07/22/2022 Time: 4098-1191 SLP Time Calculation (min) (ACUTE ONLY): 28 min   Assessment / Plan / Recommendation Clinical Impression   Carla [name] is doing well tolerating her PMV.  Her mental status does not allow her to place/remove the valve - she has no awareness that it is present. However, respiratory status has been stable when it's in place. Speech is intelligible; volume is low, and she needs verbal cues to speak loudly. Recommend that she use the valve all waking hours. Sign over bed updated, orders changed, D/W nursing.   Please consider changing trach to uncuffed.   She is also making improvements with eating/swallowing. Today she was able to attend to graham crackers, masticating effectively with no oral residue, no coughing or concerns for safety with liquids or solids. Cues were needed primarily to sustain attention to self feeding (visual/verbal).   Recommend advancing diet to dysphagia 2, thin liquids; will need help with tray set-up and supervision. SLP will follow.      HPI: Carla [name] is a 77 yo female who was found by family unresponsive and brought to ER 06/12/22.  Found to have acute on chronic hypercapnia on ABG.  Intubated in ER and unable to wean from vent.  Initial plans for one way extubation, but with change in goals. Tracheostomy placed 4/8.  Difficulty weaning from vent, but tolerating trach collar for 48 hours as of 4/18. Pt with with hx COPD from emphysema, tobacco abuse, peripheral vascular disease, hypertension, prediabetes, anxiety, baseline dementia.         SLP Plan   Continue with current plan of care      Recommendations for follow up therapy are one component of a multi-disciplinary discharge planning process, led by the attending physician.  Recommendations may be updated based on patient status, additional functional  criteria and insurance authorization.     Recommendations   Diet recommendations: Dysphagia 2 (fine chop) Liquids provided via: Cup;Straw Medication Administration: Whole meds with puree Supervision: Patient able to self feed;Staff to assist with self feeding Compensations: Minimize environmental distractions Postural Changes and/or Swallow Maneuvers: Seated upright 90 degrees         Patient may use Passy-Muir Speech Valve: Intermittently with supervision PMSV Supervision: Full MD: Please consider changing trach tube to : Cuffless               Oral care BID Frequent or constant Supervision/Assistance Dysphagia, oropharyngeal phase (R13.12) Continue with current plan of care      Carla [name] L. Carla Frederic, MA CCC/SLP Clinical Specialist - Acute Care SLP Acute Rehabilitation Services Office number [phone]   Carla [name] Carla [name]

## 2022-07-22 NOTE — Progress Notes (Addendum)
NAME:  JOHNIE STONEHOUSE, MRN:  [id number], DOB:  1945/09/19, LOS: 40 ADMISSION DATE:  06/12/2022, CONSULTATION DATE:  06/13/2022 REFERRING MD: Sherryll Burger - TRH CHIEF COMPLAINT: SOB  History of Present Illness:  77 yo female smoker with hx of COPD from emphysema found by family unresponsive and brought to ER.  Found to have acute on chronic hypercapnia on ABG.  Intubated in ER.  She was started on therapy for COPD exacerbation.  PCCM consulted to assist with respiratory management.  Pertinent Medical History:  Hypertension, Dementia, Anxiety, Depression, GERD, Primary hyperparathyroidism, Vit D deficiency, HLD, Osteoporosis  Significant Hospital Events: Including procedures, antibiotic start and stop dates in addition to other pertinent events   3/20 Admitted after being found unresponsive, intubated on ED arrival  CT angio chest >> atherosclerosis, severe coronary calcification, severe LVH, mild centrilobular emphysema, 2 mm nodule LUL, 4 mm nodule LUL Blood culture  >> Haemophilus influenzae 3/22 Had oxygen desaturation and low Vt with SBT this morning. Echo 06/14/22 >> EF 60 to 65%, mod LVH, grade 1 DD, mild/mod AR 3/25 bronch for peak pressures, low tidal volumes with mucus cast at end of ETT, thick secretions caking tube 3/29 Bronched again with purulent secretions LLL with plugging and LLL atelectasis, some increase in O2 requirement on vent, zosyn started for VAP coverage. MRSA nare swab today negative.  3/30 Remains intubated with waxing and weaning agitation on fentanyl and precedex  3/31 Remains on vent with continued issues with agitation when sedation lightened  4/2 family meeting, made DNR, plans for one way extubation 4/5 4/5-no overnight events, plan is for one-way extubation today 4/6-family changes mind regarding one-way extubation, plan will be for tracheostomy 4/8 trach placed 4/10 failed overnight weaning trial, back on PRVC and sedation  was added. Subsequently sedation weaned  and enteral agents were dc and or decreased.  4/11 failed psv w some distress. Fever. Abx added 4/12 cont efforts at psv. Most awake she has been in days   4/13 Tolerating PS.  4/15 Has been on ATC x 1:45 minutes, RR 40 and accessory muscle use, placing back on vent 4/18 Cortrak clogged and thus replaced. Still working on weaning completed CTx for PNA. 48 hrs on ATC  4/19 off vent x 72 hours so removed from room.  Planning on changing trach to cuffless.  Continuing to work with PMV.  Hypoglycemia adding D5 to maintenance fluids 4/20 transfer out of ICU 4/21 Pulled out Cortrak overnight. 4/22 Remains delirious, tolerating ATC, secretions not significant, not using PMV. 4/29 Tolerating ATC, utilizing PMV constantly during waking hours with no issues. Continues to be confused vs. mildly delirious.  Interim History / Subjective:  No significant overnight events Feeding herself lunch on rounds Pleasant and talkative, though remains somewhat confused (thought year was 1947) Oriented to self (name, birthday) Consider change to cuffless trach, given clinical stablity  Objective:  Blood pressure 131/63, pulse 94, temperature 98.2 F (36.8 C), temperature source Oral, resp. rate 19, height 5' (1.524 m), weight 42.7 kg, SpO2 96 %.    FiO2 (%):  [21 %] 21 %   Intake/Output Summary (Last 24 hours) at 07/22/2022 1420 Last data filed at 07/22/2022 0345 Gross per 24 hour  Intake 1470.34 ml  Output 1200 ml  Net 270.34 ml    Filed Weights   07/19/22 0523 07/20/22 0322 07/22/22 0345  Weight: 39.6 kg 40.9 kg 42.7 kg   Physical Examination: General: Chronically ill-appearing elderly woman in NAD. Pleasantly confused and conversant. HEENT:  [place]/AT, anicteric sclera, PERRL, dry mucous membranes. Neck: Tracheostomy in place, midline with minimal/no secretions, PMV in place. Neuro: Awake, oriented x 1 (self, name/birthday). Responds to verbal stimuli. Following commands consistently. Moves all 4  extremities spontaneously. Generalized weakness.  CV: RRR, no m/g/r. PULM: Breathing even and unlabored on ATC. Lung fields CTAB anteriorly.Marland Kitchen GI: Soft, nontender, nondistended. Normoactive bowel sounds. Extremities: No LE edema noted. Skin: Warm/dry, no rashes.  Resolved Hospital Problem List:  H Flu bacteremia Septic shock due to H Flu bacteremia, H Flu PNA  Contraction alkalosis   Assessment & Plan:  Acute encephalopathy superimposed on underlying dementia Hx Anxiety ICU delirium  Weaning CNS depressing meds -- have made a lot of progress w BZD and opiates as well as atypicals. - Limit CN-depressing medications as able - Delirium precautions - Normalize sleep/wake cycle as able - Limit tethers - Seems less delirious with PMV use  AoC resp failure w hypoxia and hypercarbia Hflu PNA S/p tracheostomy COPD Tobacco use  On trach collar for several days straight, trach changed to cuffless 4/19. - Continue ATC as toleratd - Trach care per protocol - Consider change to cuffless trach, given clinical stability - Continue bronchodilators Rosalyn Gess, Yupelri, Pulmicort) - Pulmonary hygiene - Encourage OOB, mobility PT/OT - Continue SLP, PMV use  HTN - Cardiac monitoring - Medication management per primary  AKI Hypernatremia --> iatrogenic hyponatremia, mild  Urinary retention  - Foley for urinary retention - Trend BMP  Inadequate PO intake  Constipation MBSS negative for aspiration, ok for thin liquids and purees (did not full participate in study). - Eating well after utilizing PMV/working with SLP - Bowel regimen  DM with hypoglycemia  - SSI - CBGs Q4H - Goal CBG 140-180  Best Practice right click and "Reselect all SmartList Selections" daily)   Diet/type: tubefeeds  DVT prophylaxis: LMWH GI prophylaxis: PPI Lines: Central line -- LUE PICC  Foley:  Yes, and it is still needed Code Status:  DNR  Signature:   Tim Lair, PA-C [facility] 07/22/22 2:20 PM  Please see [email or web address] for pager details.  From 7A-7P if no response, please call [phone] After hours, please call ELink [phone]

## 2022-07-22 NOTE — TOC Progression Note (Incomplete Revision)
     Transition of Care [facility]) - Progression Note      Patient Details  Name: Carla [name] MRN: [id number] Date of Birth: May 15, 1945   [facility] CM/SW Contact  Delilah Shan, LCSWA Phone Number: 07/22/2022, 1:39 PM   Clinical Narrative:      CSW tried to call patients son Loraine Leriche to introduce self and discuss  dc plan for possible trach/SNF placement for patient closer to patient being medically ready for dc. CSW unable to leave voicemail. CSW will try back this afternoon.Janina Mayo will need to be 44 days old before CSW can fax out if patients son agreeable to trach SNF placement and on 28% trach collar for a minimum of 48 hours before facility will accept. CSW will continue to follow.   Expected Discharge Plan: Skilled Nursing Facility Barriers to Discharge: Continued Medical Work up   Expected Discharge Plan and Services Discharge Planning Services: CM Consult Post Acute Care Choice: Long Term Acute Care (LTAC)                           Social Determinants of Health (SDOH) Interventions   Readmission Risk Interventions

## 2022-07-23 DIAGNOSIS — J9601 Acute respiratory failure with hypoxia: Secondary | ICD-10-CM | POA: Diagnosis not present

## 2022-07-23 DIAGNOSIS — J9602 Acute respiratory failure with hypercapnia: Secondary | ICD-10-CM | POA: Diagnosis not present

## 2022-07-23 LAB — BASIC METABOLIC PANEL
Anion gap: 9 (ref 5–15)
BUN: 25 mg/dL — ABNORMAL HIGH (ref 8–23)
CO2: 23 mmol/L (ref 22–32)
Chloride: 104 mmol/L (ref 98–111)
Creatinine, Ser: 0.69 mg/dL (ref 0.44–1.00)

## 2022-07-23 LAB — MAGNESIUM: Magnesium: 2 mg/dL (ref 1.7–2.4)

## 2022-07-23 LAB — CBC
HCT: 38.8 % (ref 36.0–46.0)
MCV: 94.9 fL (ref 80.0–100.0)
Platelets: 267 10*3/uL (ref 150–400)
RDW: 15.6 % — ABNORMAL HIGH (ref 11.5–15.5)

## 2022-07-23 MED ORDER — HALOPERIDOL LACTATE 5 MG/ML IJ SOLN
1.0000 mg | Freq: Once | INTRAMUSCULAR | Status: AC
  Administered 2022-07-23: 1 mg via INTRAMUSCULAR
  Filled 2022-07-23: qty 1

## 2022-07-23 MED ORDER — LORAZEPAM 2 MG/ML IJ SOLN
1.0000 mg | Freq: Once | INTRAMUSCULAR | Status: AC
  Administered 2022-07-23: 1 mg via INTRAMUSCULAR
  Filled 2022-07-23: qty 1

## 2022-07-23 MED ORDER — QUETIAPINE FUMARATE 25 MG PO TABS
25.0000 mg | ORAL_TABLET | Freq: Every day | ORAL | Status: DC
  Administered 2022-07-23 – 2022-07-24 (×2): 25 mg via ORAL
  Filled 2022-07-23 (×2): qty 1

## 2022-07-23 NOTE — Progress Notes (Addendum)
Physical Therapy Treatment Patient Details Name:  C  MRN:  DOB:  Today's Date: 07/23/2022   History of Present Illness 77 yo female admitted 3/20 unresponsive from home with acute respiratory failure with hypoxia and hypercapnia and metabolic encephalopathy. Intubated 3/20. trach 4/8. Pt self decannulated on 4/29. PMhx: emphysema, tobacco abuse, PVD, HTN, prediabetes, anxiety, dementia    PT Comments    Pt continues to improve and was able to amb in room a short distance with mod assist to day. Continue to work toward improving mobility and decreasing burden of care.    Recommendations for follow up therapy are one component of a multi-disciplinary discharge planning process, led by the attending physician.  Recommendations may be updated based on patient status, additional functional criteria and insurance authorization.  Follow Up Recommendations  Can patient physically be transported by private vehicle: No    Assistance Recommended at Discharge Frequent or constant Supervision/Assistance  Patient can return home with the following Direct supervision/assist for medications management;Assistance with cooking/housework;Assist for transportation;A lot of help with walking and/or transfers;A lot of help with bathing/dressing/bathroom   Equipment Recommendations  Hospital bed;Wheelchair (measurements PT);Wheelchair cushion (measurements PT)    Recommendations for Other Services       Precautions / Restrictions Precautions Precautions: Fall     Mobility  Bed Mobility Overal bed mobility: Needs Assistance Bed Mobility: Supine to Sit, Sit to Supine     Supine to sit: HOB elevated, Min assist Sit to supine: Min guard   General bed mobility comments: Assist to elevate trunk into sitting    Transfers Overall transfer level: Needs assistance Equipment used: Rolling walker (2 wheels) Transfers: Sit to/from Stand Sit to Stand: Min assist, +2  safety/equipment           General transfer comment: Assist to power up and to stabilize    Ambulation/Gait Ambulation/Gait assistance: Mod assist, +2 safety/equipment Gait Distance (Feet): 15 Feet Assistive device: Rolling walker (2 wheels) Gait Pattern/deviations: Step-through pattern, Decreased step length - right, Decreased step length - left, Narrow base of support, Trunk flexed, Shuffle, Step-to pattern Gait velocity: decr Gait velocity interpretation: <1.31 ft/sec, indicative of household ambulator   General Gait Details: Assist for balance and support. Verbal cues to stay closer to walker. Base of support so narrow that pt almost stepping on own feet   Stairs             Wheelchair Mobility    Modified Rankin (Stroke Patients Only)       Balance Overall balance assessment: Needs assistance Sitting-balance support: Feet supported, Bilateral upper extremity supported Sitting balance-Leahy Scale: Poor Sitting balance - Comments: Intermittent UE support   Standing balance support: Bilateral upper extremity supported Standing balance-Leahy Scale: Poor Standing balance comment: walker and min assist for static standing                            Cognition Arousal/Alertness: Awake/alert Behavior During Therapy: Impulsive Overall Cognitive Status: Impaired/Different from baseline Area of Impairment: Attention, Following commands, Problem solving, Orientation, Memory, Safety/judgement, Awareness                 Orientation Level: Disoriented to, Place, Time, Situation Current Attention Level: Sustained Memory: Decreased short-term memory, Decreased recall of precautions Following Commands: Follows one step commands inconsistently, Follows one step commands with increased time Safety/Judgement: Decreased awareness of safety, Decreased awareness of deficits Awareness: Intellectual Problem Solving: Requires verbal cues, Requires tactile cues,    Difficulty sequencing, Decreased initiation, Slow processing          Exercises      General Comments General comments (skin integrity, edema, etc.): VSS on RA      Pertinent Vitals/Pain Pain Assessment Pain Assessment: Faces Faces Pain Scale: No hurt    Home Living                          Prior Function            PT Goals (current goals can now be found in the care plan section) Acute Rehab PT Goals Patient Stated Goal: not stated PT Goal Formulation: Patient unable to participate in goal setting Time For Goal Achievement: 08/06/22 Potential to Achieve Goals: Good Progress towards PT goals: Goals met and updated - see care plan    Frequency    Min 1X/week      PT Plan Current plan remains appropriate    Co-evaluation              AM-PAC PT "6 Clicks" Mobility   Outcome Measure  Help needed turning from your back to your side while in a flat bed without using bedrails?: A Little Help needed moving from lying on your back to sitting on the side of a flat bed without using bedrails?: A Little Help needed moving to and from a bed to a chair (including a wheelchair)?: A Lot Help needed standing up from a chair using your arms (e.g., wheelchair or bedside chair)?: A Lot Help needed to walk in hospital room?: Total Help needed climbing 3-5 steps with a railing? : Total 6 Click Score: 12    End of Session Equipment Utilized During Treatment: Gait belt Activity Tolerance: Patient tolerated treatment well Patient left: in bed;with call bell/phone within reach;with bed alarm set Nurse Communication: Mobility status PT Visit Diagnosis: Other abnormalities of gait and mobility (R26.89);Muscle weakness (generalized) (M62.81);Difficulty in walking, not elsewhere classified (R26.2)     Time: 1137-1210 PT Time Calculation (min) (ACUTE ONLY): 33 min  Charges:  $Gait Training: 23-37 mins                     Krishay Faro PT Acute Rehabilitation  Services Office [phone]    Mahalia Dykes W [place] 07/23/2022, 1:56 PM   

## 2022-07-23 NOTE — Progress Notes (Addendum)
PROGRESS NOTE    Carla [name]  ZOX:[id number] DOB: 12/22/1945 DOA: 06/12/2022 PCP: Patient, [name]   Brief Narrative:  This 77 yo female smoker with hx of COPD from emphysema, Hypertension, Dementia, Anxiety, Depression, GERD, Primary hyperparathyroidism, Vit D deficiency, HLD, Osteoporosis , found by family unresponsive and brought to ER. Found to have acute on chronic hypercapnia on ABG. Intubated in ER. She was started on therapy for COPD exacerbation. PCCM consulted to assist with respiratory management.      Significant Events  3/20 Admitted after being found unresponsive, intubated on ED arrival  CT angio chest >> atherosclerosis, severe coronary calcification, severe LVH, mild centrilobular emphysema, 2 mm nodule LUL, 4 mm nodule LUL Blood culture  >> Haemophilus influenzae 3/22 Had oxygen desaturation and low Vt with SBT this morning. Echo 06/14/22 >> EF 60 to 65%, mod LVH, grade 1 DD, mild/mod AR 3/25 bronch for peak pressures, low tidal volumes with mucus cast at end of ETT, thick secretions caking tube 3/29 Bronched again with purulent secretions LLL with plugging and LLL atelectasis, some increase in O2 requirement on vent, zosyn started for VAP coverage. MRSA nare swab today negative.  3/30 Remains intubated with waxing and weaning agitation on fentanyl and precedex  3/31 Remains on vent with continued issues with agitation when sedation lightened  4/2 family meeting, made DNR, plans for one way extubation 4/5 4/5-no overnight events, plan is for one-way extubation today 4/6-family changes mind regarding one-way extubation, plan will be for tracheostomy 4/8 trach placed 4/10 failed overnight weaning trial, back on PRVC and sedation  was added. Subsequently sedation weaned and enteral agents were dc and or decreased.  4/11 failed psv w some distress. Fever. Abx added 4/12 cont efforts at psv. Most awake she has been in days   4/13 Tolerating PS.  4/15 Has been on ATC x  1:45 minutes, RR 40 and accessory muscle use, placing back on vent 4/18 Cortrak clogged and thus replaced. Still working on weaning completed CTx for PNA. 48 hrs on ATC  4/19 off vent x 72 hours so removed from room.  Planning on changing trach to cuffless.  Continuing to work with PMV.  Hypoglycemia adding D5 to maintenance fluids 4/20 transfer out of ICU  4/21 transfer to [facility]  4/26: Patient pulled tracheostomy tube.  Placed it back. 4/28: Patient pulled PICC line, PIV inserted, Soft restraints. 4/30 : PCCM signed off   Assessment & Plan:   Principal Problem:   Acute respiratory failure with hypoxia and hypercapnia (HCC) Active Problems:   COPD with exacerbation (HCC)   Hyperglycemia   Acute metabolic encephalopathy   HTN (hypertension)   Tobacco abuse   Anxiety   Peripheral vascular disease (HCC)   Pulmonary nodules   Dementia (HCC)   Protein-calorie malnutrition, severe (HCC)   Urinary retention   Hypervolemia   Encephalopathy acute   Tracheostomy in place (HCC)   AKI (acute kidney injury) (HCC)   Hypernatremia   Fever   Acute respiratory failure with hypoxia (HCC)   Hyponatremia   Pressure injury of skin  Acute hypoxic and hypercapnic respiratory failure: S/p intubation on admission.  s/p tracheostomy , changed to trach collar and cuffless on 4/19.  Continue with brovana, pulmicort and yupelri.  She is now more alert and on 5 lit of [place] oxygen.  PCCM managing trach care.   Acute metabolic encephalopathy > Improving  She has baseline Dementia.   Presented with unresponsiveness.  Head CT negative for  acute abnormality.   Likely due to hypercarbic respiratory failure and ICU delirium.  Pulled out cortrak on 4/22.  Patient was agitated and restless was given Versed push Slp evaluation recommended dysphagia 1 diet with thin liquids.  Patient has on and off agitation requiring Versed push. Start seroquel 25 mg qhs   Type 2 DM:   Continue with SSI. No changes in  meds.  Last Hb A1c is 6.0 last month.    Acute kidney injury: Resolved.    Pulmonary nodules: Incidental finding on CT 06/12/2022 - 2 mm and 4 mm noncalcified left upper lobe lung nodules.  No follow-up needed if patient is low-risk (and has no known or suspected primary neoplasm).  Non-contrast chest CT can be considered in 12 months if patient is high-risk.  -Follow-up as outpatient   HTN (hypertension) Better better controlled. Continue Norvasc 5 mg daily,  coreg 3.125 mg BID.     Chronic diastolic heart failure:  She appears Euvolemic.  Continue to monitor.    Nutrition:  SLP on board, MBS DONE,  and recommended pureed/ dysphagia 1  diet.    Hypokalemia:  Replaced.Continue to monitor     RN Pressure Injury Documentation:     Pressure Injury 07/10/22 Nare Right;Medial Stage 2 -  Partial thickness loss of dermis presenting as a shallow open injury with a red, pink wound bed without slough. small, pink/red area due to bridle from Cortrak (Active)  07/10/22 1610  Location: Nare  Location Orientation: Right;Medial  Staging: Stage 2 -  Partial thickness loss of dermis presenting as a shallow open injury with a red, pink wound bed without slough.  Wound Description (Comments): small, pink/red area due to bridle from Cortrak  Present on Admission: No  Dressing Type None 07/16/22 0850  Continue with local Wound care   Disposition:  TOC looking into LTAC.      Malnutrition Type: Nutrition Problem: Severe Malnutrition Etiology: chronic illness (COPD, dementia)   Malnutrition Characteristics: Signs/Symptoms: severe fat depletion, severe muscle depletion     Nutrition Interventions: Interventions: Tube feeding   Estimated body mass index is 18.3 kg/m as calculated from the following:   Height as of this encounter: 5' (1.524 m).   Weight as of this encounter: 42.5 kg.   DVT prophylaxis: Lovenox Code Status: DNR Family Communication: No family at bed  side. Disposition Plan:   Status is: Inpatient Remains inpatient appropriate because:  TOC on board for LTAC, not a candidate for CIR TOC states 1 month after tracheostomy placement to look for SNF options. Difficult placement due to tracheostomy status.   Consultants:  Palliative care PCCM  Procedures: S/p trach  Antimicrobials: Anti-infectives (From admission, onward)    Start     Dose/Rate Route Frequency Ordered Stop   07/06/22 1000  cefTRIAXone (ROCEPHIN) 2 g in sodium chloride 0.9 % 100 mL IVPB        2 g 200 mL/hr over 30 Minutes Intravenous Every 24 hours 07/06/22 0848 07/08/22 0952   07/05/22 0200  piperacillin-tazobactam (ZOSYN) IVPB 3.375 g  Status:  Discontinued        3.375 g 12.5 mL/hr over 240 Minutes Intravenous Every 8 hours 07/04/22 1826 07/06/22 0848   07/04/22 1915  vancomycin (VANCOCIN) IVPB 1000 mg/200 mL premix  Status:  Discontinued        1,000 mg 200 mL/hr over 60 Minutes Intravenous Every 48 hours 07/04/22 1826 07/05/22 0827   07/04/22 1915  piperacillin-tazobactam (ZOSYN) IVPB 3.375 g  3.375 g 100 mL/hr over 30 Minutes Intravenous  Once 07/04/22 1826 07/04/22 1914   06/21/22 2300  piperacillin-tazobactam (ZOSYN) IVPB 3.375 g        3.375 g 12.5 mL/hr over 240 Minutes Intravenous Every 8 hours 06/21/22 1631 06/29/22 2009   06/21/22 1645  piperacillin-tazobactam (ZOSYN) IVPB 3.375 g  Status:  Discontinued        3.375 g 12.5 mL/hr over 240 Minutes Intravenous Every 8 hours 06/21/22 1630 06/21/22 1631   06/21/22 1645  piperacillin-tazobactam (ZOSYN) IVPB 3.375 g        3.375 g 100 mL/hr over 30 Minutes Intravenous STAT 06/21/22 1631 06/21/22 1900   06/14/22 1315  cefTRIAXone (ROCEPHIN) 2 g in sodium chloride 0.9 % 100 mL IVPB  Status:  Discontinued        2 g 200 mL/hr over 30 Minutes Intravenous Every 24 hours 06/14/22 1249 06/20/22 1415   06/12/22 2230  doxycycline (VIBRAMYCIN) 100 mg in sodium chloride 0.9 % 250 mL IVPB  Status:   Discontinued        100 mg 125 mL/hr over 120 Minutes Intravenous Every 12 hours 06/12/22 2139 06/14/22 1350      Subjective: Patient was seen and examined at bedside.  Overnight events noted. Patient was lying comfortably and she was able to follow command. She pulled her PICC line yesterday, she pulled the tracheostomy tube again today. She is awaiting LTAC placement.  Objective: Vitals:   07/22/22 2006 07/23/22 0316 07/23/22 0513 07/23/22 0801  BP: (!) 156/79 (!) 172/72    Pulse: 93 88  92  Resp: (!) 22 18  17   Temp: 97.7 F (36.5 C) (!) 97.5 F (36.4 C)    TempSrc: Axillary Axillary    SpO2: 95% 93%  96%  Weight:   41.2 kg   Height:        Intake/Output Summary (Last 24 hours) at 07/23/2022 1305 Last data filed at 07/23/2022 0322 Gross per 24 hour  Intake --  Output 1200 ml  Net -1200 ml   Filed Weights   07/20/22 0322 07/22/22 0345 07/23/22 0513  Weight: 40.9 kg 42.7 kg 41.2 kg    Examination:  General exam: Appears comfortable, deconditioned, sick looking, tracheostomy noted. Respiratory system: CTA bilaterally, respiratory effort normal. RR 13 Cardiovascular system: S1-S2 heard, regular rate and rhythm, no murmur. Gastrointestinal system: Abdomen is soft, non tender, non distended, BS+ Central nervous system: Alert and oriented x 1. No focal neurological deficits. Extremities: No edema, no cyanosis, no clubbing. Skin: No rashes, lesions or ulcers Psychiatry:  Mood & affect appropriate.     Data Reviewed: I have personally reviewed following labs and imaging studies  CBC: Recent Labs  Lab 07/17/22 0555 07/19/22 0300 07/20/22 0206 07/21/22 0219 07/23/22 0249  WBC 10.4  --  6.9 6.0 7.6  NEUTROABS 7.2  --   --   --   --   HGB 12.7 12.6 12.6 12.7 12.4  HCT 40.4 38.8 40.1 38.4 38.8  MCV 96.7  --  97.1 94.6 94.9  PLT 355  --  305 282 267   Basic Metabolic Panel: Recent Labs  Lab 07/17/22 0555 07/19/22 0300 07/20/22 0206 07/21/22 0219  07/23/22 0249  NA 136 138 137 138 136  K 3.1* 3.2* 3.3* 3.1* 3.2*  CL 102 104 106 105 104  CO2 26 25 24 24 23   GLUCOSE 144* 100* 104* 127* 126*  BUN 11 14 13 10  25*  CREATININE 0.71 0.69 0.71 0.73 0.69  CALCIUM 9.9 9.6 9.5 9.6 9.8  MG 2.2 1.9 2.0 2.0 2.0  PHOS  --  2.4* 2.5 2.4* 2.5   GFR: Estimated Creatinine Clearance: 38.3 mL/min (by C-G formula based on SCr of 0.69 mg/dL). Liver Function Tests: Recent Labs  Lab 07/17/22 0555  AST 27  ALT 35  ALKPHOS 102  BILITOT 0.4  PROT 6.4*  ALBUMIN 2.6*   No results for input(s): "LIPASE", "AMYLASE" in the last 168 hours. No results for input(s): "AMMONIA" in the last 168 hours. Coagulation Profile: No results for input(s): "INR", "PROTIME" in the last 168 hours. Cardiac Enzymes: No results for input(s): "CKTOTAL", "CKMB", "CKMBINDEX", "TROPONINI" in the last 168 hours. BNP (last 3 results) No results for input(s): "PROBNP" in the last 8760 hours. HbA1C: No results for input(s): "HGBA1C" in the last 72 hours. CBG: No results for input(s): "GLUCAP" in the last 168 hours.  Lipid Profile: No results for input(s): "CHOL", "HDL", "LDLCALC", "TRIG", "CHOLHDL", "LDLDIRECT" in the last 72 hours. Thyroid Function Tests: No results for input(s): "TSH", "T4TOTAL", "FREET4", "T3FREE", "THYROIDAB" in the last 72 hours. Anemia Panel: No results for input(s): "VITAMINB12", "FOLATE", "FERRITIN", "TIBC", "IRON", "RETICCTPCT" in the last 72 hours. Sepsis Labs: No results for input(s): "PROCALCITON", "LATICACIDVEN" in the last 168 hours.  No results found for this or any previous visit (from the past 240 hour(s)).   Radiology Studies: [place] EKG SITE RITE  Result Date: 07/22/2022 If Site Rite image not attached, placement could not be confirmed due to current cardiac rhythm.   Scheduled Meds:  amLODipine  5 mg Oral Daily   arformoterol  15 mcg Nebulization BID   budesonide (PULMICORT) nebulizer solution  0.5 mg Nebulization BID    carvedilol  3.125 mg Oral BID WC   Chlorhexidine Gluconate Cloth  6 each Topical Daily   clonazePAM  0.5 mg Oral Daily   clonazePAM  0.5 mg Oral QHS   enoxaparin (LOVENOX) injection  30 mg Subcutaneous Daily   feeding supplement  237 mL Oral BID BM   nutrition supplement (JUVEN)  1 packet Oral BID BM   pantoprazole  40 mg Oral Daily   QUEtiapine  25 mg Oral QHS   revefenacin  175 mcg Nebulization Daily   sodium chloride flush  10-40 mL Intracatheter Q12H   Continuous Infusions:  sodium chloride 10 mL/hr at 07/01/22 0503   dextrose 5 % and 0.45% NaCl Stopped (07/21/22 1900)     LOS: 41 days    Time spent: 35 mins    Willeen Niece, MD [facility]   If 7PM-7AM, please contact night-coverage

## 2022-07-23 NOTE — Progress Notes (Addendum)
NAME:  Carla [name], MRN:  [id number], DOB:  Jul 21, 1945, LOS: 41 ADMISSION DATE:  06/12/2022, CONSULTATION DATE:  06/13/2022 REFERRING MD: Sherryll Burger - [facility] CHIEF COMPLAINT: SOB  History of Present Illness:  77 yo female smoker with hx of COPD from emphysema found by family unresponsive and brought to ER.  Found to have acute on chronic hypercapnia on ABG.  Intubated in ER.  She was started on therapy for COPD exacerbation.  PCCM consulted to assist with respiratory management.  Pertinent Medical History:  Hypertension, Dementia, Anxiety, Depression, GERD, Primary hyperparathyroidism, Vit D deficiency, HLD, Osteoporosis  Significant Hospital Events: Including procedures, antibiotic start and stop dates in addition to other pertinent events   3/20 Admitted after being found unresponsive, intubated on ED arrival  CT angio chest >> atherosclerosis, severe coronary calcification, severe LVH, mild centrilobular emphysema, 2 mm nodule LUL, 4 mm nodule LUL Blood culture  >> Haemophilus influenzae 3/22 Had oxygen desaturation and low Vt with SBT this morning. Echo 06/14/22 >> EF 60 to 65%, mod LVH, grade 1 DD, mild/mod AR 3/25 bronch for peak pressures, low tidal volumes with mucus cast at end of ETT, thick secretions caking tube 3/29 Bronched again with purulent secretions LLL with plugging and LLL atelectasis, some increase in O2 requirement on vent, zosyn started for VAP coverage. MRSA nare swab today negative.  3/30 Remains intubated with waxing and weaning agitation on fentanyl and precedex  3/31 Remains on vent with continued issues with agitation when sedation lightened  4/2 family meeting, made DNR, plans for one way extubation 4/5 4/5-no overnight events, plan is for one-way extubation today 4/6-family changes mind regarding one-way extubation, plan will be for tracheostomy 4/8 trach placed 4/10 failed overnight weaning trial, back on PRVC and sedation  was added. Subsequently sedation weaned  and enteral agents were dc and or decreased.  4/11 failed psv w some distress. Fever. Abx added 4/12 cont efforts at psv. Most awake she has been in days   4/13 Tolerating PS.  4/15 Has been on ATC x 1:45 minutes, RR 40 and accessory muscle use, placing back on vent 4/18 Cortrak clogged and thus replaced. Still working on weaning completed CTx for PNA. 48 hrs on ATC  4/19 off vent x 72 hours so removed from room.  Planning on changing trach to cuffless.  Continuing to work with PMV.  Hypoglycemia adding D5 to maintenance fluids 4/20 transfer out of ICU 4/21 Pulled out Cortrak overnight. 4/22 Remains delirious, tolerating ATC, secretions not significant, not using PMV. 4/29 Tolerating ATC, utilizing PMV constantly during waking hours with no issues. Continues to be confused vs. mildly delirious. Tracheostomy removed by patient.  Interim History / Subjective:  Tracheostomy was pulled out yesterday no distress notes  Objective:  Blood pressure (!) 172/72, pulse 92, temperature (!) 97.5 F (36.4 C), temperature source Axillary, resp. rate 17, height 5' (1.524 m), weight 41.2 kg, SpO2 96 %.    FiO2 (%):  [21 %] 21 %   Intake/Output Summary (Last 24 hours) at 07/23/2022 1219 Last data filed at 07/23/2022 4098 Gross per 24 hour  Intake --  Output 1200 ml  Net -1200 ml   Filed Weights   07/20/22 0322 07/22/22 0345 07/23/22 0513  Weight: 40.9 kg 42.7 kg 41.2 kg   Physical Examination: 77 year old female in no acute distress Dressing on tracheostomy site is dry and intact Decreased air movement Heart sounds are regular Lower extremity mild edema  Resolved Hospital Problem List:  H Flu  bacteremia Septic shock due to H Flu bacteremia, H Flu PNA  Contraction alkalosis   Assessment & Plan:  Acute encephalopathy superimposed on underlying dementia Hx Anxiety ICU delirium  Per primary  AoC resp failure w hypoxia and hypercarbia Hflu PNA S/p tracheostomy patient removed on 07/22/2022  doing well without the tracheostomy COPD Tobacco use  07/23/2022 pulmonary critical care will sign off at this time out issues per primary  HTN  AKI Hypernatremia --> iatrogenic hyponatremia, mild  Urinary retention   Inadequate PO intake  Constipation   DM with hypoglycemia    Best Practice right click and "Reselect all SmartList Selections" daily)   Diet/type: tubefeeds  DVT prophylaxis: LMWH GI prophylaxis: PPI Lines: Central line -- LUE PICC  Foley:  Yes, and it is still needed Code Status:  DNR  Signature:  Brett Canales Minor ACNP Acute Care Nurse Practitioner [facility]/Critical [facility] Please consult Amion 07/23/2022, 12:19 PM

## 2022-07-23 NOTE — TOC Progression Note (Addendum)
Transition of Care (TOC) - Progression Note    Patient Details  Name: Fairy C Guion MRN: [id number] Date of Birth: 06/09/1945  Transition of Care (TOC) CM/SW Contact  Scharlene Catalina Bryant, LCSWA Phone Number: 07/23/2022, 11:50 AM  Clinical Narrative:     CSW left message for Mark patients son. CSW awaiting call back to introduce self and start discussions in regards to dc plans/PT recommendations for patient. CSW will continue to follow and assist with patients dc planning needs.  Expected Discharge Plan: Skilled Nursing Facility Barriers to Discharge: Continued Medical Work up  Expected Discharge Plan and Services   Discharge Planning Services: CM Consult Post Acute Care Choice: Long Term Acute Care (LTAC)                                         Social Determinants of Health (SDOH) Interventions    Readmission Risk Interventions    07/22/2022    1:43 PM  Readmission Risk Prevention Plan  Transportation Screening Complete  Palliative Care Screening Complete    

## 2022-07-23 NOTE — TOC Progression Note (Incomplete Revision)
Transition of Care (TOC) - Progression Note    Patient Details  Name: Fairy C Guion MRN: [id number] Date of Birth: 06/09/1945  Transition of Care (TOC) CM/SW Contact  Scharlene Catalina Bryant, LCSWA Phone Number: 07/23/2022, 11:50 AM  Clinical Narrative:     CSW left message for Mark patients son. CSW awaiting call back to introduce self and start discussions in regards to dc plans/PT recommendations for patient. CSW will continue to follow and assist with patients dc planning needs.  Expected Discharge Plan: Skilled Nursing Facility Barriers to Discharge: Continued Medical Work up  Expected Discharge Plan and Services   Discharge Planning Services: CM Consult Post Acute Care Choice: Long Term Acute Care (LTAC)                                         Social Determinants of Health (SDOH) Interventions    Readmission Risk Interventions    07/22/2022    1:43 PM  Readmission Risk Prevention Plan  Transportation Screening Complete  Palliative Care Screening Complete    

## 2022-07-24 DIAGNOSIS — J9602 Acute respiratory failure with hypercapnia: Secondary | ICD-10-CM | POA: Diagnosis not present

## 2022-07-24 DIAGNOSIS — J9601 Acute respiratory failure with hypoxia: Secondary | ICD-10-CM | POA: Diagnosis not present

## 2022-07-24 LAB — BASIC METABOLIC PANEL
Anion gap: 7 (ref 5–15)
CO2: 28 mmol/L (ref 22–32)
Chloride: 103 mmol/L (ref 98–111)
Creatinine, Ser: 0.76 mg/dL (ref 0.44–1.00)
GFR, Estimated: >60 mL/min (ref >60)
Glucose, Bld: 131 mg/dL — ABNORMAL HIGH (ref 70–99)
Potassium: 3.3 mmol/L — ABNORMAL LOW (ref 3.5–5.1)
Sodium: 138 mmol/L (ref 135–145)

## 2022-07-24 MED ORDER — ORAL CARE MOUTH RINSE: 15.0000 mL | OROMUCOSAL | Status: DC | PRN

## 2022-07-24 MED ORDER — ORAL CARE MOUTH RINSE
15.0000 mL | OROMUCOSAL | Status: DC
  Administered 2022-07-25 – 2022-08-26 (×79): 15 mL via OROMUCOSAL

## 2022-07-24 MED ORDER — HALOPERIDOL LACTATE 5 MG/ML IJ SOLN
1.0000 mg | Freq: Once | INTRAMUSCULAR | Status: AC
  Administered 2022-07-24: 1 mg via INTRAMUSCULAR
  Filled 2022-07-24: qty 1

## 2022-07-24 MED ORDER — POTASSIUM CHLORIDE CRYS ER 20 MEQ PO TBCR
40.0000 meq | EXTENDED_RELEASE_TABLET | Freq: Once | ORAL | Status: DC
  Filled 2022-07-24: qty 2

## 2022-07-24 NOTE — Progress Notes (Addendum)
Speech Language Pathology Treatment: Dysphagia  Patient Details Name: Carla [name] MRN: [id number] DOB: 07-09-1945 Today's Date: 07/24/2022 Time: 9528-4132 SLP Time Calculation (min) (ACUTE ONLY): 15 min  Assessment / Plan / Recommendation Clinical Impression  Pt seen for ongoing dysphagia management.  Pt self-decannulated overnight on 4/29 and is now on room air. Removed PMSV signs at head of bed. RN repositioned pt in bed.  Pt with baseline cough during care.  Pt tolerated thin liquids by straw, puree, and soft solid textures.  Pt exhibited good oral clearance of solids.  With regular solids there was a mild throat clear after 1 of 3 trials.  After end of session there was stronger, more prolonged cough, but this is suspected to be unrelated to POs given baseline cough.    Recommend mechanical soft diet with thin liquids.    HPI HPI: Carla [name] is a 77 yo female who was found by family unresponsive and brought to ER 06/12/22.  Found to have acute on chronic hypercapnia on ABG.  Intubated in ER and unable to wean from vent.  Initial plans for one way extubation, but with change in goals. Tracheostomy placed 4/8.  Difficulty weaning from vent, but tolerating trach collar for 48 hours as of 4/18. Pt with with hx COPD from emphysema, tobacco abuse, peripheral vascular disease, hypertension, prediabetes, anxiety, baseline dementia.      SLP Plan  Continue with current plan of care      Recommendations for follow up therapy are one component of a multi-disciplinary discharge planning process, led by the attending physician.  Recommendations may be updated based on patient status, additional functional criteria and insurance authorization.    Recommendations  Diet recommendations: Dysphagia 3 (mechanical soft);Thin liquid Liquids provided via: Cup;Straw Medication Administration: Whole meds with puree Supervision: Staff to assist with self feeding Compensations: Minimize environmental  distractions;Slow rate;Small sips/bites Postural Changes and/or Swallow Maneuvers: Seated upright 90 degrees                  Oral care BID   Frequent or constant Supervision/Assistance Dysphagia, oropharyngeal phase (R13.12)     Continue with current plan of care     Kerrie Pleasure, MA, CCC-SLP Acute Rehabilitation Services Office: [phone] 07/24/2022, 10:38 AM

## 2022-07-24 NOTE — TOC Progression Note (Incomplete Revision)
Transition of Care (TOC) - Progression Note    Patient Details  Name: Kallista C Erion MRN: [id number] Date of Birth: 04/24/1945  Transition of Care (TOC) CM/SW Contact  Dekayla Prestridge Bryant, LCSWA Phone Number: 07/24/2022, 11:16 AM  Clinical Narrative:     CSW received consult for possible SNF placement at time of discharge. CSW spoke with patients Granddaughter Amanda regarding PT recommendation of SNF placement at time of discharge. Patients Granddaughter reports PTA patient comes from home with spouse and 2 sons who help take care of her.Patients Granddaughter expressed understanding of PT recommendation and is agreeable to SNF placement for patient at time of discharge. Patients Granddaughter gave CSW permission to fax out for SNF.CSW informed patients Granddaughter CSW following to fax patient out for SNF when appropriate. CSW discussed insurance authorization process.No further questions reported at this time. CSW to continue to follow and assist with discharge planning needs.   Expected Discharge Plan: Skilled Nursing Facility Barriers to Discharge: Continued Medical Work up  Expected Discharge Plan and Services   Discharge Planning Services: CM Consult Post Acute Care Choice: Long Term Acute Care (LTAC)                                         Social Determinants of Health (SDOH) Interventions    Readmission Risk Interventions    07/22/2022    1:43 PM  Readmission Risk Prevention Plan  Transportation Screening Complete  Palliative Care Screening Complete    

## 2022-07-24 NOTE — TOC Progression Note (Addendum)
Transition of Care (TOC) - Progression Note    Patient Details  Name: Carla [name] MRN: [id number] Date of Birth: 04/24/1945  Transition of Care (TOC) CM/SW Contact  Dekayla Prestridge Bryant, LCSWA Phone Number: 07/24/2022, 11:16 AM  Clinical Narrative:     CSW received consult for possible SNF placement at time of discharge. CSW spoke with patients Granddaughter Amanda regarding PT recommendation of SNF placement at time of discharge. Patients Granddaughter reports PTA patient comes from home with spouse and 2 sons who help take care of her.Patients Granddaughter expressed understanding of PT recommendation and is agreeable to SNF placement for patient at time of discharge. Patients Granddaughter gave CSW permission to fax out for SNF.CSW informed patients Granddaughter CSW following to fax patient out for SNF when appropriate. CSW discussed insurance authorization process.No further questions reported at this time. CSW to continue to follow and assist with discharge planning needs.   Expected Discharge Plan: Skilled Nursing Facility Barriers to Discharge: Continued Medical Work up  Expected Discharge Plan and Services   Discharge Planning Services: CM Consult Post Acute Care Choice: Long Term Acute Care (LTAC)                                         Social Determinants of Health (SDOH) Interventions    Readmission Risk Interventions    07/22/2022    1:43 PM  Readmission Risk Prevention Plan  Transportation Screening Complete  Palliative Care Screening Complete    

## 2022-07-24 NOTE — Progress Notes (Addendum)
PROGRESS NOTE Carla [name]  NWG:[id number] DOB: 1946/02/27 DOA: 06/12/2022 PCP: Patient, [name]  Brief Narrative/Hospital Course: 29 yof w/ COPD/emphysema tobacco use,Hypertension, Dementia, Anxiety, Depression, GERD, Primary hyperparathyroidism, Vit D deficiency, HLD, Osteoporosis , found by family unresponsive and brought to ER on 06/12/22 and found to have acute on chronic hypercapnia on ABG>Intubated in ER and treated for COPD exacerbation. She had prolonged complicated hospitalization charted as below with multiple ICU admissions, needing tracheostomy placement, having encephalopathy with baseline dementia and pulling out her PICC line tracheostomy.   Significant Events  3/20 Admitted after being found unresponsive, intubated on ED arrival  CT angio chest >> atherosclerosis, severe coronary calcification, severe LVH, mild centrilobular emphysema, 2 mm nodule LUL, 4 mm nodule LUL Blood culture  >> Haemophilus influenzae 3/22 Had oxygen desaturation and low Vt with SBT this morning. Echo 06/14/22 >> EF 60 to 65%, mod LVH, grade 1 DD, mild/mod AR 3/25 bronch for peak pressures, low tidal volumes with mucus cast at end of ETT, thick secretions caking tube 3/29 Bronched again with purulent secretions LLL with plugging and LLL atelectasis, some increase in O2 requirement on vent, zosyn started for VAP coverage. MRSA nare swab today negative.  3/30 Remains intubated with waxing and weaning agitation on fentanyl and precedex  3/31 Remains on vent with continued issues with agitation when sedation lightened  4/2 family meeting, made DNR, plans for one way extubation 4/5 4/5-no overnight events, plan is for one-way extubation today 4/6-family changes mind regarding one-way extubation, plan will be for tracheostomy 4/8 trach placed 4/10 failed overnight weaning trial, back on PRVC and sedation  was added. Subsequently sedation weaned and enteral agents were dc and or decreased.  4/11 failed psv  w some distress. Fever. Abx added 4/12 cont efforts at psv. Most awake she has been in days   4/13 Tolerating PS.  4/15 Has been on ATC x 1:45 minutes, RR 40 and accessory muscle use, placing back on vent 4/18 Cortrak clogged and thus replaced. Still working on weaning completed CTx for PNA. 48 hrs on ATC  4/19 off vent x 72 hours so removed from room.  Planning on changing trach to cuffless.  Continuing to work with PMV.  Hypoglycemia adding D5 to maintenance fluids 4/20 transfer out of ICU  4/21 transfer to [facility].  4/26: Patient pulled tracheostomy tube.  4/28: Patient pulled PICC line, PIV inserted, Soft restraints. 4/30 : PCCM signed off-she is doing well without the tracheostomy     Subjective: Seen and examined Has muffled voice-able to say her name, in fetal position on RA, otherwise difficult to understand, not answer other questions Patient has been afebrile, doing well on room air BP stable in 140s Had labs drawn 4/30 with potassium 3.2, repeat labs ordered.  Assessment and Plan: Principal Problem:   Acute respiratory failure with hypoxia and hypercapnia (HCC) Active Problems:   COPD with exacerbation (HCC)   Hyperglycemia   Acute metabolic encephalopathy   HTN (hypertension)   Tobacco abuse   Anxiety   Peripheral vascular disease (HCC)   Pulmonary nodules   Dementia (HCC)   Protein-calorie malnutrition, severe (HCC)   Urinary retention   Hypervolemia   Encephalopathy acute   Tracheostomy in place (HCC)   AKI (acute kidney injury) (HCC)   Hypernatremia   Fever   Acute respiratory failure with hypercapnia (HCC)   Hyponatremia   Pressure injury of skin  Acute hypoxic/hypercapnic respiratory failure: Complicated hospital PRN intubation, tracheostomy placement and trach  collar, trach got pulled out by patient on 4/26, she has been doing well without tracheostomy site is healed, doing well on room air.  Continue [name] Pulmicort and bronchodilator  PRN  Acute metabolic encephalopathy in the background of dementia::Presented with unresponsiveness s/p extensive workup including  head CT negative for acute abnormality. Felt to be due to respiratory failure and also with ICU delirium.  Speech following of core track tolerating p.o. dysphagia 2, she is alert awake follows some commands and is calm. Cont seroquel 25 mg qhs   Type 2 diabetes mellitus controlled on SSILast Hb A1c is 6.0 last month.   No results for input(s): "GLUCAP", "HGBA1C" in the last 168 hours.  Acute kidney injury: Resolved.    Pulmonary nodules:Incidental finding on CT 06/12/2022 - 2 mm and 4 mm noncalcified left upper lobe lung nodules.  She will need follow-up with CT scan in 12 months   Essential hypertension BP controlled on Norvasc and Coreg  Chronic diastolic CHF currently euvolemic  Hypokalemia replacing now  Severe malnutrition augment diet as below ID following Nutrition Problem: Severe Malnutrition Etiology: chronic illness (COPD, dementia) Signs/Symptoms: severe fat depletion, severe muscle depletion Interventions: Tube feeding   DVT prophylaxis: enoxaparin (LOVENOX) injection 30 mg Start: 07/02/22 1000 Code Status:   Code Status: DNR Family Communication: plan of care discussed with patient/no family  at bedside. Patient status is:  inpatient  because of awaiting LTACH Unable to contact family for few days per saffs.  Level of care: Progressive  Dispo:            Anticipated disposition: Pending LTAC vs SNF since no trach.  Objective: Vitals last 24 hrs: Vitals:   07/23/22 1953 07/23/22 2031 07/24/22 0616 07/24/22 0853  BP: (!) 156/87  (!) 144/80   Pulse:  91  90  Resp: 20 20 20 20   Temp: 98.2 F (36.8 C)     TempSrc: Oral     SpO2: 99% 95% 94% 92%  Weight:   40.6 kg   Height:       Weight change: -0.6 kg  Physical Examination: General exam: alert awake, oriented x1, thin HEENT:Oral mucosa moist, Ear/Nose WNL grossly Respiratory  system: bilaterally clear BS, no use of accessory muscle Cardiovascular system: S1 & S2 +, No JVD. Gastrointestinal system: Abdomen soft,NT,ND, BS+ Nervous System:Alert, awake, moving extremities. Extremities: LE edema neg,distal peripheral pulses palpable.  Skin: No rashes,no icterus. MSK: Normal muscle bulk,tone, power  Medications reviewed:  Scheduled Meds:  amLODipine  5 mg Oral Daily   arformoterol  15 mcg Nebulization BID   budesonide (PULMICORT) nebulizer solution  0.5 mg Nebulization BID   carvedilol  3.125 mg Oral BID WC   Chlorhexidine Gluconate Cloth  6 each Topical Daily   clonazePAM  0.5 mg Oral Daily   clonazePAM  0.5 mg Oral QHS   enoxaparin (LOVENOX) injection  30 mg Subcutaneous Daily   feeding supplement  237 mL Oral BID BM   nutrition supplement (JUVEN)  1 packet Oral BID BM   pantoprazole  40 mg Oral Daily   QUEtiapine  25 mg Oral QHS   revefenacin  175 mcg Nebulization Daily   sodium chloride flush  10-40 mL Intracatheter Q12H  Continuous Infusions:  sodium chloride 10 mL/hr at 07/01/22 0503   dextrose 5 % and 0.45% NaCl Stopped (07/21/22 1900)    Diet Order             DIET DYS 2 Room service appropriate? Yes with  Assist; Fluid consistency: Thin  Diet effective now                 Nutrition Problem: Severe Malnutrition Etiology: chronic illness (COPD, dementia) Signs/Symptoms: severe fat depletion, severe muscle depletion Interventions: Tube feeding  No intake or output data in the 24 hours ending 07/24/22 0936 Net IO Since Admission: 7,369.33 mL [07/24/22 0936]  Wt Readings from Last 3 Encounters:  07/24/22 40.6 kg     Unresulted Labs (From admission, onward)     Start     Ordered   07/24/22 0936  Basic metabolic panel  Once,   R       Question:  Specimen collection method  Answer:  Lab=Lab collect   07/24/22 0935   06/12/22 1810  Miscellaneous test (send-out)  Once,   R        06/12/22 1810          Data Reviewed: I have personally  reviewed following labs and imaging studies CBC: Recent Labs  Lab 07/19/22 0300 07/20/22 0206 07/21/22 0219 07/23/22 0249  WBC  --  6.9 6.0 7.6  HGB 12.6 12.6 12.7 12.4  HCT 38.8 40.1 38.4 38.8  MCV  --  97.1 94.6 94.9  PLT  --  305 282 267   Basic Metabolic Panel: Recent Labs  Lab 07/19/22 0300 07/20/22 0206 07/21/22 0219 07/23/22 0249  NA 138 137 138 136  K 3.2* 3.3* 3.1* 3.2*  CL 104 106 105 104  CO2 25 24 24 23   GLUCOSE 100* 104* 127* 126*  BUN 14 13 10  25*  CREATININE 0.69 0.71 0.73 0.69  CALCIUM 9.6 9.5 9.6 9.8  MG 1.9 2.0 2.0 2.0  PHOS 2.4* 2.5 2.4* 2.5  No results found for this or any previous visit (from the past 240 hour(s)).  Antimicrobials: Anti-infectives (From admission, onward)    Start     Dose/Rate Route Frequency Ordered Stop   07/06/22 1000  cefTRIAXone (ROCEPHIN) 2 g in sodium chloride 0.9 % 100 mL IVPB        2 g 200 mL/hr over 30 Minutes Intravenous Every 24 hours 07/06/22 0848 07/08/22 0952   07/05/22 0200  piperacillin-tazobactam (ZOSYN) IVPB 3.375 g  Status:  Discontinued        3.375 g 12.5 mL/hr over 240 Minutes Intravenous Every 8 hours 07/04/22 1826 07/06/22 0848   07/04/22 1915  vancomycin (VANCOCIN) IVPB 1000 mg/200 mL premix  Status:  Discontinued        1,000 mg 200 mL/hr over 60 Minutes Intravenous Every 48 hours 07/04/22 1826 07/05/22 0827   07/04/22 1915  piperacillin-tazobactam (ZOSYN) IVPB 3.375 g        3.375 g 100 mL/hr over 30 Minutes Intravenous  Once 07/04/22 1826 07/04/22 1914   06/21/22 2300  piperacillin-tazobactam (ZOSYN) IVPB 3.375 g        3.375 g 12.5 mL/hr over 240 Minutes Intravenous Every 8 hours 06/21/22 1631 06/29/22 2009   06/21/22 1645  piperacillin-tazobactam (ZOSYN) IVPB 3.375 g  Status:  Discontinued        3.375 g 12.5 mL/hr over 240 Minutes Intravenous Every 8 hours 06/21/22 1630 06/21/22 1631   06/21/22 1645  piperacillin-tazobactam (ZOSYN) IVPB 3.375 g        3.375 g 100 mL/hr over 30 Minutes  Intravenous STAT 06/21/22 1631 06/21/22 1900   06/14/22 1315  cefTRIAXone (ROCEPHIN) 2 g in sodium chloride 0.9 % 100 mL IVPB  Status:  Discontinued  2 g 200 mL/hr over 30 Minutes Intravenous Every 24 hours 06/14/22 1249 06/20/22 1415   06/12/22 2230  doxycycline (VIBRAMYCIN) 100 mg in sodium chloride 0.9 % 250 mL IVPB  Status:  Discontinued        100 mg 125 mL/hr over 120 Minutes Intravenous Every 12 hours 06/12/22 2139 06/14/22 1350      Culture/Microbiology    Component Value Date/Time   SDES BLOOD RIGHT ARM 07/04/2022 1812   SPECREQUEST  07/04/2022 1812    BOTTLES DRAWN AEROBIC ONLY Blood Culture adequate volume   CULT  07/04/2022 1812    NO GROWTH 5 DAYS Performed at [facility], [address] [place], [place] [address]    REPTSTATUS 07/09/2022 FINAL 07/04/2022 1812  Radiology Studies: No results found.   LOS: 42 days   Lanae Boast, MD [facility]  07/24/2022, 9:36 AM

## 2022-07-25 ENCOUNTER — Inpatient Hospital Stay (HOSPITAL_COMMUNITY): Payer: HMO

## 2022-07-25 DIAGNOSIS — J9601 Acute respiratory failure with hypoxia: Secondary | ICD-10-CM | POA: Diagnosis not present

## 2022-07-25 DIAGNOSIS — J9602 Acute respiratory failure with hypercapnia: Secondary | ICD-10-CM | POA: Diagnosis not present

## 2022-07-25 DIAGNOSIS — S0990XA Unspecified injury of head, initial encounter: Secondary | ICD-10-CM | POA: Diagnosis not present

## 2022-07-25 MED ORDER — MUPIROCIN CALCIUM 2 % EX CREA
TOPICAL_CREAM | Freq: Two times a day (BID) | CUTANEOUS | Status: DC
  Administered 2022-07-25 – 2022-08-02 (×2): 1 via TOPICAL
  Filled 2022-07-25: qty 15

## 2022-07-25 MED ORDER — QUETIAPINE FUMARATE 50 MG PO TABS
50.0000 mg | ORAL_TABLET | Freq: Every day | ORAL | Status: DC
  Administered 2022-07-25 – 2022-08-06 (×12): 50 mg via ORAL
  Filled 2022-07-25 (×13): qty 1

## 2022-07-25 NOTE — Progress Notes (Addendum)
PROGRESS NOTE VERNADINE COOMBS  ZOX:[id number] DOB: 1945/12/11 DOA: 06/12/2022 PCP: Patient, [name]  Brief Narrative/Hospital Course: 67 yof w/ COPD/emphysema tobacco use,Hypertension, Dementia, Anxiety, Depression, GERD, Primary hyperparathyroidism, Vit D deficiency, HLD, Osteoporosis , found by family unresponsive and brought to ER on 06/12/22 and found to have acute on chronic hypercapnia on ABG>Intubated in ER and treated for COPD exacerbation. She had prolonged complicated hospitalization charted as below with multiple ICU admissions, needing tracheostomy placement, having encephalopathy with baseline dementia and pulling out her PICC line tracheostomy.   Significant Events  3/20 Admitted after being found unresponsive, intubated on ED arrival  CT angio chest >> atherosclerosis, severe coronary calcification, severe LVH, mild centrilobular emphysema, 2 mm nodule LUL, 4 mm nodule LUL Blood culture  >> Haemophilus influenzae 3/22 Had oxygen desaturation and low Vt with SBT this morning. Echo 06/14/22 >> EF 60 to 65%, mod LVH, grade 1 DD, mild/mod AR 3/25 bronch for peak pressures, low tidal volumes with mucus cast at end of ETT, thick secretions caking tube 3/29 Bronched again with purulent secretions LLL with plugging and LLL atelectasis, some increase in O2 requirement on vent, zosyn started for VAP coverage. MRSA nare swab today negative.  3/30 Remains intubated with waxing and weaning agitation on fentanyl and precedex  3/31 Remains on vent with continued issues with agitation when sedation lightened  4/2 family meeting, made DNR, plans for one way extubation 4/5 4/5-no overnight events, plan is for one-way extubation today 4/6-family changes mind regarding one-way extubation, plan will be for tracheostomy 4/8 trach placed 4/10 failed overnight weaning trial, back on PRVC and sedation  was added. Subsequently sedation weaned and enteral agents were dc and or decreased.  4/11 failed psv  w some distress. Fever. Abx added 4/12 cont efforts at psv. Most awake she has been in days   4/13 Tolerating PS.  4/15 Has been on ATC x 1:45 minutes, RR 40 and accessory muscle use, placing back on vent 4/18 Cortrak clogged and thus replaced. Still working on weaning completed CTx for PNA. 48 hrs on ATC  4/19 off vent x 72 hours so removed from room.  Planning on changing trach to cuffless.  Continuing to work with PMV.  Hypoglycemia adding D5 to maintenance fluids 4/20 transfer out of ICU  4/21 [facility] to [facility].  4/26: Patient pulled tracheostomy tube.  4/28: Patient pulled PICC line, PIV inserted, Soft restraints. 4/30 : PCCM signed off-she is doing well without the tracheostomy     Subjective: Seen this am Alert awake able ot tell me her name On wait restraint She has been needing Haldol intermittently for agitation.  Assessment and Plan: Principal Problem:   Acute respiratory failure with hypoxia and hypercapnia (HCC) Active Problems:   COPD with exacerbation (HCC)   Hyperglycemia   Acute metabolic encephalopathy   HTN (hypertension)   Tobacco abuse   Anxiety   Peripheral vascular disease (HCC)   Pulmonary nodules   Dementia (HCC)   Protein-calorie malnutrition, severe (HCC)   Urinary retention   Hypervolemia   Encephalopathy acute   Tracheostomy in place [facility])   AKI (acute kidney injury) (HCC)   Hypernatremia   Fever   Acute respiratory failure with hypercapnia (HCC)   Hyponatremia   Pressure injury of skin  Acute hypoxic/hypercapnic respiratory failure: Complicated hospital PRN intubation, tracheostomy placement and trach collar, trach got pulled out by patient on 4/26, she has been doing well without tracheostomy site is healed, doing well on room air.  Continue [name] Pulmicort and bronchodilator PRN  Acute metabolic encephalopathy in the background of dementia::Presented with unresponsiveness s/p extensive workup including  head CT negative for acute  abnormality. Felt to be due to respiratory failure and also with ICU delirium.  Speech following of core track tolerating p.o. dysphagia 2, she is alert awake follows some commands and is calm. Cont seroquel 25 mg qhs   Type 2 diabetes mellitus controlled on SSILast Hb A1c is 6.0 last month.   Acute kidney injury:Resolved.    Pulmonary nodules:Incidental finding on CT 06/12/2022 - 2 mm and 4 mm noncalcified left upper lobe lung nodules.  She will need follow-up with CT scan in 12 months   Essential hypertension BP controlled on Norvasc and Coreg  Chronic diastolic CHF currently euvolemic Net IO Since Admission: 6,240.16 mL [07/25/22 1016]   Hypokalemia replaced po Recent Labs  Lab 07/19/22 0300 07/20/22 0206 07/21/22 0219 07/23/22 0249 07/24/22 0954  K 3.2* 3.3* 3.1* 3.2* 3.3*    Severe malnutrition augment diet as below ID following Nutrition Problem: Severe Malnutrition Etiology: chronic illness (COPD, dementia) Signs/Symptoms: severe fat depletion, severe muscle depletion Interventions: Tube feeding   DVT prophylaxis: enoxaparin (LOVENOX) injection 30 mg Start: 07/02/22 1000 Code Status:   Code Status: DNR Family Communication: plan of care discussed with patient/no family  at bedside. Patient status is:  inpatient  because of awaiting LTACH Unable to contact family for few days per saffs.  Level of care: Progressive  Dispo:            Anticipated disposition: Pending LTAC vs SNF since no trach.  Objective: Vitals last 24 hrs: Vitals:   07/24/22 2312 07/25/22 0500 07/25/22 0749 07/25/22 0812  BP: 128/67 125/74  136/70  Pulse:  85  95  Resp: 16 (!) 22    Temp: 98.1 F (36.7 C) (!) 97 F (36.1 C)  98 F (36.7 C)  TempSrc: Axillary Axillary  Oral  SpO2: 95% 93% 96%   Weight:  39.9 kg    Height:       Weight change: -0.7 kg  Physical Examination: General exam: AAox1,elderly, frail, weak,older appearing HEENT:Oral mucosa moist, Ear/Nose WNL grossly, dentition  normal. Respiratory system: bilaterally clear BS, no use of accessory muscle Cardiovascular system: S1 & S2 +, regular rate. Gastrointestinal system: Abdomen soft, NT,ND,BS+ Nervous System:Alert, awake, moving extremities and grossly nonfocal Extremities: LE ankle edema neg, lower extremities warm Skin: No rashes,no icterus. MSK: Normal muscle bulk,tone, power   Medications reviewed:  Scheduled Meds:  amLODipine  5 mg Oral Daily   arformoterol  15 mcg Nebulization BID   budesonide (PULMICORT) nebulizer solution  0.5 mg Nebulization BID   carvedilol  3.125 mg Oral BID WC   Chlorhexidine Gluconate Cloth  6 each Topical Daily   clonazePAM  0.5 mg Oral Daily   clonazePAM  0.5 mg Oral QHS   enoxaparin (LOVENOX) injection  30 mg Subcutaneous Daily   feeding supplement  237 mL Oral BID BM   nutrition supplement (JUVEN)  1 packet Oral BID BM   mouth rinse  15 mL Mouth Rinse 4 times per day   pantoprazole  40 mg Oral Daily   potassium chloride  40 mEq Oral Once   QUEtiapine  25 mg Oral QHS   revefenacin  175 mcg Nebulization Daily   sodium chloride flush  10-40 mL Intracatheter Q12H  Continuous Infusions:  sodium chloride 10 mL/hr at 07/01/22 0503   dextrose 5 % and 0.45% NaCl Stopped (  07/21/22 1900)    Diet Order             DIET DYS 3 Fluid consistency: Thin  Diet effective now                 Nutrition Problem: Severe Malnutrition Etiology: chronic illness (COPD, dementia) Signs/Symptoms: severe fat depletion, severe muscle depletion Interventions: Tube feeding   Intake/Output Summary (Last 24 hours) at 07/25/2022 1015 Last data filed at 07/25/2022 0600 Gross per 24 hour  Intake 20.83 ml  Output 1150 ml  Net -1129.17 ml   Net IO Since Admission: 6,240.16 mL [07/25/22 1015]  Wt Readings from Last 3 Encounters:  07/25/22 39.9 kg     Unresulted Labs (From admission, onward)     Start     Ordered   06/12/22 1810  Miscellaneous test (send-out)  Once,   R         06/12/22 1810          Data Reviewed: I have personally reviewed following labs and imaging studies CBC: Recent Labs  Lab 07/19/22 0300 07/20/22 0206 07/21/22 0219 07/23/22 0249  WBC  --  6.9 6.0 7.6  HGB 12.6 12.6 12.7 12.4  HCT 38.8 40.1 38.4 38.8  MCV  --  97.1 94.6 94.9  PLT  --  305 282 267   Basic Metabolic Panel: Recent Labs  Lab 07/19/22 0300 07/20/22 0206 07/21/22 0219 07/23/22 0249 07/24/22 0954  NA 138 137 138 136 138  K 3.2* 3.3* 3.1* 3.2* 3.3*  CL 104 106 105 104 103  CO2 25 24 24 23 28   GLUCOSE 100* 104* 127* 126* 131*  BUN 14 13 10  25* 18  CREATININE 0.69 0.71 0.73 0.69 0.76  CALCIUM 9.6 9.5 9.6 9.8 10.0  MG 1.9 2.0 2.0 2.0  --   PHOS 2.4* 2.5 2.4* 2.5  --   No results found for this or any previous visit (from the past 240 hour(s)).  Antimicrobials: Anti-infectives (From admission, onward)    Start     Dose/Rate Route Frequency Ordered Stop   07/06/22 1000  cefTRIAXone (ROCEPHIN) 2 g in sodium chloride 0.9 % 100 mL IVPB        2 g 200 mL/hr over 30 Minutes Intravenous Every 24 hours 07/06/22 0848 07/08/22 0952   07/05/22 0200  piperacillin-tazobactam (ZOSYN) IVPB 3.375 g  Status:  Discontinued        3.375 g 12.5 mL/hr over 240 Minutes Intravenous Every 8 hours 07/04/22 1826 07/06/22 0848   07/04/22 1915  vancomycin (VANCOCIN) IVPB 1000 mg/200 mL premix  Status:  Discontinued        1,000 mg 200 mL/hr over 60 Minutes Intravenous Every 48 hours 07/04/22 1826 07/05/22 0827   07/04/22 1915  piperacillin-tazobactam (ZOSYN) IVPB 3.375 g        3.375 g 100 mL/hr over 30 Minutes Intravenous  Once 07/04/22 1826 07/04/22 1914   06/21/22 2300  piperacillin-tazobactam (ZOSYN) IVPB 3.375 g        3.375 g 12.5 mL/hr over 240 Minutes Intravenous Every 8 hours 06/21/22 1631 06/29/22 2009   06/21/22 1645  piperacillin-tazobactam (ZOSYN) IVPB 3.375 g  Status:  Discontinued        3.375 g 12.5 mL/hr over 240 Minutes Intravenous Every 8 hours 06/21/22 1630  06/21/22 1631   06/21/22 1645  piperacillin-tazobactam (ZOSYN) IVPB 3.375 g        3.375 g 100 mL/hr over 30 Minutes Intravenous STAT 06/21/22 1631 06/21/22 1900  06/14/22 1315  cefTRIAXone (ROCEPHIN) 2 g in sodium chloride 0.9 % 100 mL IVPB  Status:  Discontinued        2 g 200 mL/hr over 30 Minutes Intravenous Every 24 hours 06/14/22 1249 06/20/22 1415   06/12/22 2230  doxycycline (VIBRAMYCIN) 100 mg in sodium chloride 0.9 % 250 mL IVPB  Status:  Discontinued        100 mg 125 mL/hr over 120 Minutes Intravenous Every 12 hours 06/12/22 2139 06/14/22 1350      Culture/Microbiology    Component Value Date/Time   SDES BLOOD RIGHT ARM 07/04/2022 1812   SPECREQUEST  07/04/2022 1812    BOTTLES DRAWN AEROBIC ONLY Blood Culture adequate volume   CULT  07/04/2022 1812    NO GROWTH 5 DAYS Performed at [facility], [address], [place], [place] [address]    REPTSTATUS 07/09/2022 FINAL 07/04/2022 1812  Radiology Studies: No results found.   LOS: 43 days   Lanae Boast, MD [facility]  07/25/2022, 10:15 AM

## 2022-07-25 NOTE — Progress Notes (Incomplete Revision)
SLP Cancellation Note  Patient Details Name: Carla [name] MRN: [id number] DOB: 07/10/1945   Cancelled treatment:        Pt declined. Just got back into bed after being found on the floor. Will continue attempts   Evangelia Whitaker Willis 07/25/2022, 3:22 PM 

## 2022-07-25 NOTE — TOC Progression Note (Incomplete Revision)
Transition of Care (TOC) - Progression Note    Patient Details  Name: Carla [name] MRN: [id number] Date of Birth: 10/11/1945  Transition of Care (TOC) CM/SW Contact  Obie Silos Bryant, LCSWA Phone Number: 07/25/2022, 12:10 PM  Clinical Narrative:     CSW spoke with patients Granddaughter Amanda regarding SNF placement. CSW requested patients SSN for passr. Patients granddaughter provided CSW with patients SSN-[id number]. CSW will fax patient out for possible SNF placement. CSW will continue to follow and assist with patients dc planning needs.   Expected Discharge Plan: Skilled Nursing Facility Barriers to Discharge: Continued Medical Work up  Expected Discharge Plan and Services   Discharge Planning Services: CM Consult Post Acute Care Choice: Long Term Acute Care (LTAC)                                         Social Determinants of Health (SDOH) Interventions    Readmission Risk Interventions    07/22/2022    1:43 PM  Readmission Risk Prevention Plan  Transportation Screening Complete  Palliative Care Screening Complete    

## 2022-07-25 NOTE — Progress Notes (Addendum)
SLP Cancellation Note  Patient Details Name: Carla [name] MRN: [id number] DOB: 07/10/1945   Cancelled treatment:        Pt declined. Just got back into bed after being found on the floor. Will continue attempts   Evangelia Whitaker Willis 07/25/2022, 3:22 PM 

## 2022-07-25 NOTE — Progress Notes (Addendum)
Physical Therapy Treatment Patient Details Name: Carla [name] MRN: [id number] DOB: [birth year] Today's Date: 07/25/2022   History of Present Illness 77 yo female admitted 3/20 unresponsive from home with acute respiratory failure with hypoxia and hypercapnia and metabolic encephalopathy. Intubated 3/20. trach 4/8. Pt self decannulated on 4/29. PMhx: emphysema, tobacco abuse, PVD, HTN, prediabetes, anxiety, advanced dementia.    PT Comments    Pt received sidelying to her L side, awakens when blinds opened in her room and with fair participation and tolerance for transfer training. Pt with noted cognitive deficit and difficulty following more than simple multimodal commands this date, needing up to maxA for sit<>stand from bed<>RW and minA for lateral seated scooting at EOB to simulate OOB scoot transfer. BP WFL post-transfer training on her L forearm, pt with secretion-laden cough and apparent skin tear on L upper bicep without a dressing over it, RN notified. Pt continues to benefit from PT services to progress toward functional mobility goals.   Recommendations for follow up therapy are one component of a multi-disciplinary discharge planning process, led by the attending physician.  Recommendations may be updated based on patient status, additional functional criteria and insurance authorization.  Follow Up Recommendations  Can patient physically be transported by private vehicle: No    Assistance Recommended at Discharge Frequent or constant Supervision/Assistance  Patient can return home with the following Direct supervision/assist for medications management;Assistance with cooking/housework;Assist for transportation;A lot of help with bathing/dressing/bathroom;Two people to help with walking and/or transfers;Help with stairs or ramp for entrance   Equipment Recommendations  Hospital bed;Wheelchair (measurements PT);Wheelchair cushion (measurements PT)    Recommendations for Other  Services       Precautions / Restrictions Precautions Precautions: Fall Precaution Comments: decannulated Restrictions Weight Bearing Restrictions: No     Mobility  Bed Mobility Overal bed mobility: Needs Assistance Bed Mobility: Rolling, Sidelying to Sit, Sit to Sidelying Rolling: Mod assist Sidelying to sit: Mod assist, HOB elevated     Sit to sidelying: Min assist, HOB elevated General bed mobility comments: Assist to elevate trunk into sitting, increased time/effort to perform, pt does not move legs over EOB without dense multimodal cues.    Transfers Overall transfer level: Needs assistance Equipment used: Rolling walker (2 wheels) Transfers: Sit to/from Stand, Bed to chair/wheelchair/BSC Sit to Stand: Max assist   Step pivot transfers: Mod assist      Lateral/Scoot Transfers: Min assist General transfer comment: Assist to power up and to stabilize within RW, posterior lean initially. ModA for stepping toward her L side for pivot to chair, but pt impulsive to return to EOB despite dense cues to continue pivoting to chair. Pt performed lateral scooting toward HOB successfully x3 scoots with minA    Ambulation/Gait Ambulation/Gait assistance: Mod assist Gait Distance (Feet): 4 Feet Assistive device: Rolling walker (2 wheels) Gait Pattern/deviations: Step-through pattern, Decreased step length - right, Decreased step length - left, Narrow base of support, Trunk flexed, Shuffle, Step-to pattern, Knee flexed in stance - right, Knee flexed in stance - left Gait velocity: decr     General Gait Details: Assist for balance and support. Verbal cues to stay closer to walker, pt not following command well and with poor navigation. Pt impulsive to return back to EOB.   Stairs             Wheelchair Mobility    Modified Rankin (Stroke Patients Only)       Balance Overall balance assessment: Needs assistance Sitting-balance support: Feet supported, Bilateral  upper extremity supported Sitting balance-Leahy Scale: Poor Sitting balance - Comments: Intermittent UE support   Standing balance support: Bilateral upper extremity supported Standing balance-Leahy Scale: Poor Standing balance comment: walker and min assist for static standing                            Cognition Arousal/Alertness: Awake/alert Behavior During Therapy: Impulsive, Restless, Anxious Overall Cognitive Status: Impaired/Different from baseline Area of Impairment: Attention, Following commands, Problem solving, Orientation, Memory, Safety/judgement, Awareness                 Orientation Level: Disoriented to, Place, Time, Situation Current Attention Level: Focused Memory: Decreased short-term memory, Decreased recall of precautions Following Commands: Follows one step commands inconsistently Safety/Judgement: Decreased awareness of safety, Decreased awareness of deficits Awareness: Intellectual Problem Solving: Requires verbal cues, Requires tactile cues, Difficulty sequencing, Decreased initiation, Slow processing General Comments: Pt needs multimodal cues this date and follows <25% of simple commands. Pt was able to begin self-feeding when lunch tray placed in front of her after it arrived. NT/RN notified and aware as she is full Supervision for meals, NT entering room at end of session.        Exercises Other Exercises Other Exercises: supine BLE AROM: ankle pumps x10    General Comments General comments (skin integrity, edema, etc.): Defer OOB to chair for pt safety given increased confusion this date and pt food tray arriving, set up in chair posture in bed to eat at end of session, bed alarm lap belt re-donned for her safety by RN. BP taken on BLE but inaccurate readings while pt sitting due to restlessness, checked on her L forearm (skin tear on L bicep) and 150's/70's while seated EOB.      Pertinent Vitals/Pain Pain Assessment Pain Assessment:  PAINAD Faces Pain Scale: No hurt Breathing: normal Negative Vocalization: none Facial Expression: smiling or inexpressive Body Language: tense, distressed pacing, fidgeting Consolability: distracted or reassured by voice/touch PAINAD Score: 2 Facial Expression: Relaxed, neutral Body Movements: Restlessness Muscle Tension: Tense, rigid Compliance with ventilator (intubated pts.): N/A Vocalization (extubated pts.): Talking in normal tone or no sound CPOT Total: 3 Pain Location: noted small skin tear on L upper bicep when checking BP and pt c/o pain from BP cuff, RN notified and BP cuff moved to L forearm. Pain Descriptors / Indicators: Restless, Guarding Pain Intervention(s): Limited activity within patient's tolerance, Monitored during session, Repositioned, Other (comment) (RN notified)           PT Goals (current goals can now be found in the care plan section) Acute Rehab PT Goals Patient Stated Goal: pt unable to state other than "go home" PT Goal Formulation: Patient unable to participate in goal setting Time For Goal Achievement: 08/06/22 Progress towards PT goals: Progressing toward goals    Frequency    Min 1X/week      PT Plan Current plan remains appropriate       AM-PAC PT "6 Clicks" Mobility   Outcome Measure  Help needed turning from your back to your side while in a flat bed without using bedrails?: A Little Help needed moving from lying on your back to sitting on the side of a flat bed without using bedrails?: A Lot Help needed moving to and from a bed to a chair (including a wheelchair)?: A Lot Help needed standing up from a chair using your arms (e.g., wheelchair or bedside chair)?: A Lot Help needed to  walk in hospital room?: Total Help needed climbing 3-5 steps with a railing? : Total 6 Click Score: 11    End of Session Equipment Utilized During Treatment: Gait belt Activity Tolerance: Patient tolerated treatment well Patient left: in bed;with  call bell/phone within reach;with bed alarm set Nurse Communication: Mobility status PT Visit Diagnosis: Other abnormalities of gait and mobility (R26.89);Muscle weakness (generalized) (M62.81);Difficulty in walking, not elsewhere classified (R26.2)     Time: 4098-1191 PT Time Calculation (min) (ACUTE ONLY): 38 min  Charges:  $Gait Training: 8-22 mins $Therapeutic Activity: 23-37 mins                     Bashar Milam P., PTA Acute Rehabilitation Services Secure Chat Preferred 9a-5:30pm Office: [phone]    [place] 07/25/2022, 1:53 PM

## 2022-07-25 NOTE — TOC Progression Note (Addendum)
Transition of Care (TOC) - Progression Note    Patient Details  Name: Naevia C Polka MRN: [id number] Date of Birth: 10/11/1945  Transition of Care (TOC) CM/SW Contact  Obie Silos Bryant, LCSWA Phone Number: 07/25/2022, 12:10 PM  Clinical Narrative:     CSW spoke with patients Granddaughter Amanda regarding SNF placement. CSW requested patients SSN for passr. Patients granddaughter provided CSW with patients SSN-[id number]. CSW will fax patient out for possible SNF placement. CSW will continue to follow and assist with patients dc planning needs.   Expected Discharge Plan: Skilled Nursing Facility Barriers to Discharge: Continued Medical Work up  Expected Discharge Plan and Services   Discharge Planning Services: CM Consult Post Acute Care Choice: Long Term Acute Care (LTAC)                                         Social Determinants of Health (SDOH) Interventions    Readmission Risk Interventions    07/22/2022    1:43 PM  Readmission Risk Prevention Plan  Transportation Screening Complete  Palliative Care Screening Complete    

## 2022-07-26 DIAGNOSIS — J9602 Acute respiratory failure with hypercapnia: Secondary | ICD-10-CM | POA: Diagnosis not present

## 2022-07-26 DIAGNOSIS — J9601 Acute respiratory failure with hypoxia: Secondary | ICD-10-CM | POA: Diagnosis not present

## 2022-07-26 MED ORDER — ORAL CARE MOUTH RINSE
15.0000 mL | OROMUCOSAL | Status: DC
  Administered 2022-07-26: 15 mL via OROMUCOSAL

## 2022-07-26 MED ORDER — ORAL CARE MOUTH RINSE: 15.0000 mL | OROMUCOSAL | Status: DC | PRN

## 2022-07-26 NOTE — Progress Notes (Incomplete Revision)
Speech Language Pathology Treatment: Dysphagia  Patient Details Name: Carla [name] MRN: [id number] DOB: 08/14/1945 Today's Date: 07/26/2022 Time: 0957-1005 SLP Time Calculation (min) (ACUTE ONLY): 8 min  Assessment / Plan / Recommendation Clinical Impression  Pt being fed breakfast upon arrival.  She is doing well overall tolerating advanced diet of dysphagia 3/thin and is more interested in eating. Swallow function appears to be stable. Voice remains hypophonic, consistent with volume/quality prior to decannulation.  She produces occasional cough while eating, c/w behaviors since we have been following her. MBS 4/22 showed reliable airway protection. Current diet is appropriate and recommend that she remain on dysphagia 3/thin liquids when D/Cd to SNF.  When appropriate, she should be encouraged to feed herself as much as able.  Pt has met goal. No further SLP f/u is needed. Our service will sign off.   HPI HPI: Carla [name] is a 77 yo female who was found by family unresponsive and brought to ER 06/12/22.  Found to have acute on chronic hypercapnia on ABG.  Intubated in ER and unable to wean from vent.  Initial plans for one way extubation, but with change in goals. Tracheostomy placed 4/8.  Difficulty weaning from vent, but tolerating trach collar for 48 hours as of 4/18. Pt with with hx COPD from emphysema, tobacco abuse, peripheral vascular disease, hypertension, prediabetes, anxiety, baseline dementia.      SLP Plan  All goals met      Recommendations for follow up therapy are one component of a multi-disciplinary discharge planning process, led by the attending physician.  Recommendations may be updated based on patient status, additional functional criteria and insurance authorization.    Recommendations  Diet recommendations: Dysphagia 3 (mechanical soft);Thin liquid Liquids provided via: Cup;Straw Medication Administration: Whole meds with puree Supervision: Staff to assist  with self feeding Compensations: Minimize environmental distractions Postural Changes and/or Swallow Maneuvers: Seated upright 90 degrees                  Oral care BID   Frequent or constant Supervision/Assistance Dysphagia, oropharyngeal phase (R13.12)     All goals met    Carla [name] L. Carla Gallaga, MA CCC/SLP Clinical Specialist - Acute Care SLP Acute Rehabilitation Services Office number [phone]  Carla [name]  07/26/2022, 10:09 AM 

## 2022-07-26 NOTE — TOC Progression Note (Addendum)
Transition of Care [facility]) - Progression Note    Patient Details  Name: Carla [name] MRN: [id number] Date of Birth: 06/10/1945  Transition of Care [facility]) CM/SW Contact  Delilah Shan, LCSWA Phone Number: 07/26/2022, 11:15 AM  Clinical Narrative:     CSW informed patient has sitter and restraints. CSW following to refax patient out for SNF when appropriate. Patient will need to be restraint free chemical and physical and sitter free without incident for 48 hours. CSW will continue to follow and assist with patients dc planning needs.  Expected Discharge Plan: Skilled Nursing Facility Barriers to Discharge: Continued Medical Work up  Expected Discharge Plan and Services   Discharge Planning Services: CM Consult Post Acute Care Choice: Long Term Acute Care (LTAC)                                         Social Determinants of Health (SDOH) Interventions    Readmission Risk Interventions    07/22/2022    1:43 PM  Readmission Risk Prevention Plan  Transportation Screening Complete  Palliative Care Screening Complete

## 2022-07-26 NOTE — Progress Notes (Addendum)
Speech Language Pathology Treatment: Dysphagia  Patient Details Name: Carla [name] MRN: [id number] DOB: 08/14/1945 Today's Date: 07/26/2022 Time: 0957-1005 SLP Time Calculation (min) (ACUTE ONLY): 8 min  Assessment / Plan / Recommendation Clinical Impression  Pt being fed breakfast upon arrival.  She is doing well overall tolerating advanced diet of dysphagia 3/thin and is more interested in eating. Swallow function appears to be stable. Voice remains hypophonic, consistent with volume/quality prior to decannulation.  She produces occasional cough while eating, c/w behaviors since we have been following her. MBS 4/22 showed reliable airway protection. Current diet is appropriate and recommend that she remain on dysphagia 3/thin liquids when D/Cd to SNF.  When appropriate, she should be encouraged to feed herself as much as able.  Pt has met goal. No further SLP f/u is needed. Our service will sign off.   HPI HPI: Carla [name] is a 77 yo female who was found by family unresponsive and brought to ER 06/12/22.  Found to have acute on chronic hypercapnia on ABG.  Intubated in ER and unable to wean from vent.  Initial plans for one way extubation, but with change in goals. Tracheostomy placed 4/8.  Difficulty weaning from vent, but tolerating trach collar for 48 hours as of 4/18. Pt with with hx COPD from emphysema, tobacco abuse, peripheral vascular disease, hypertension, prediabetes, anxiety, baseline dementia.      SLP Plan  All goals met      Recommendations for follow up therapy are one component of a multi-disciplinary discharge planning process, led by the attending physician.  Recommendations may be updated based on patient status, additional functional criteria and insurance authorization.    Recommendations  Diet recommendations: Dysphagia 3 (mechanical soft);Thin liquid Liquids provided via: Cup;Straw Medication Administration: Whole meds with puree Supervision: Staff to assist  with self feeding Compensations: Minimize environmental distractions Postural Changes and/or Swallow Maneuvers: Seated upright 90 degrees                  Oral care BID   Frequent or constant Supervision/Assistance Dysphagia, oropharyngeal phase (R13.12)     All goals met    Carla Larranaga L. Wren Gallaga, MA CCC/SLP Clinical Specialist - Acute Care SLP Acute Rehabilitation Services Office number [phone]  Carla [name]  07/26/2022, 10:09 AM 

## 2022-07-26 NOTE — Progress Notes (Addendum)
PROGRESS NOTE Carla [name]  ZOX:[id number] DOB: 26-Dec-1945 DOA: 06/12/2022 PCP: Patient, [name]  Brief Narrative/Hospital Course: 27 yof w/ COPD/emphysema tobacco use,Hypertension, Dementia, Anxiety, Depression, GERD, Primary hyperparathyroidism, Vit D deficiency, HLD, Osteoporosis , found by family unresponsive and brought to ER on 06/12/22 and found to have acute on chronic hypercapnia on ABG>Intubated in ER and treated for COPD exacerbation. She had prolonged complicated hospitalization charted as below with multiple ICU admissions, needing tracheostomy placement, having encephalopathy with baseline dementia and pulling out her PICC line tracheostomy.   Significant Events  3/20 Admitted after being found unresponsive, intubated on ED arrival  CT angio chest >> atherosclerosis, severe coronary calcification, severe LVH, mild centrilobular emphysema, 2 mm nodule LUL, 4 mm nodule LUL Blood culture  >> Haemophilus influenzae 3/22 Had oxygen desaturation and low Vt with SBT this morning. Echo 06/14/22 >> EF 60 to 65%, mod LVH, grade 1 DD, mild/mod AR 3/25 bronch for peak pressures, low tidal volumes with mucus cast at end of ETT, thick secretions caking tube 3/29 Bronched again with purulent secretions LLL with plugging and LLL atelectasis, some increase in O2 requirement on vent, zosyn started for VAP coverage. MRSA nare swab today negative.  3/30 Remains intubated with waxing and weaning agitation on fentanyl and precedex  3/31 Remains on vent with continued issues with agitation when sedation lightened  4/2 family meeting, made DNR, plans for one way extubation 4/5 4/5-no overnight events, plan is for one-way extubation today 4/6-family changes mind regarding one-way extubation, plan will be for tracheostomy 4/8 trach placed 4/10 failed overnight weaning trial, back on PRVC and sedation  was added. Subsequently sedation weaned and enteral agents were dc and or decreased.  4/11 failed psv  w some distress. Fever. Abx added 4/12 cont efforts at psv. Most awake she has been in days   4/13 Tolerating PS.  4/15 Has been on ATC x 1:45 minutes, RR 40 and accessory muscle use, placing back on vent 4/18 Cortrak clogged and thus replaced. Still working on weaning completed CTx for PNA. 48 hrs on ATC  4/19 off vent x 72 hours so removed from room.  Planning on changing trach to cuffless.  Continuing to work with PMV.  Hypoglycemia adding D5 to maintenance fluids 4/20 transfer out of ICU  4/21 transfer to [facility].  4/26: Patient pulled tracheostomy tube.  4/28: Patient pulled PICC line, PIV inserted, Soft restraints. 4/30 : PCCM signed off-she is doing well without the tracheostomy     Subjective: Seen examined this morning, more alert awake moving all extremities well no pain.  Able to tell me her name and that she is in [facility] at the bedside due to fall yesterday 5/2 No change in mental status Assessment and Plan: Principal Problem:   Acute respiratory failure with hypoxia and hypercapnia (HCC) Active Problems:   COPD with exacerbation (HCC)   Hyperglycemia   Acute metabolic encephalopathy   HTN (hypertension)   Tobacco abuse   Anxiety   Peripheral vascular disease (HCC)   Pulmonary nodules   Dementia (HCC)   Protein-calorie malnutrition, severe (HCC)   Urinary retention   Hypervolemia   Encephalopathy acute   Tracheostomy in place (HCC)   AKI (acute kidney injury) (HCC)   Hypernatremia   Fever   Acute respiratory failure with hypercapnia (HCC)   Hyponatremia   Pressure injury of skin  Acute hypoxic/hypercapnic respiratory failure:Complicated hospital PRN intubation, tracheostomy placement and trach collar, trach got pulled out  by patient on 4/26, she has been doing well without tracheostomy site is healed, doing well on room air.  Continue Carla [name] Pulmicort and bronchodilator PRN  Acute metabolic encephalopathy in the background of  dementia::Presented with unresponsiveness s/p extensive workup including  head CT negative for acute abnormality. Felt to be due to respiratory failure and also with ICU delirium.  Speech following of core track tolerating p.o. dysphagia 2, she remains alert awake oriented to self.  Increase Seroquel to 50 mg nightly 5/2 and tolerating well.  Can try Seroquel low-dose in the morning if agitation, minimize use of Haldol.  Found on the floor on 5/2 small bruise on the back of the head:?  Hair got caught versus fall.  CT head 5/2 no acute finding.  Pupil equal and reactive bilaterally no focal weakness doing well, she was kept on sitter one-to-one due to fall.  Continue fall precaution   Type 2 diabetes mellitus controlled on SSILast Hb A1c is 6.0 last month.   Acute kidney injury:Resolved.    Pulmonary nodules:Incidental finding on CT 06/12/2022 - 2 mm and 4 mm noncalcified left upper lobe lung nodules.  She will need follow-up with CT scan in 12 months   Essential hypertension BP controlled on Norvasc and Coreg  Chronic diastolic CHF currently euvolemic Net IO Since Admission: 6,190.16 mL [07/26/22 1138]   Hypokalemia replaced po.   Severe malnutrition augment diet as below ID following Nutrition Problem: Severe Malnutrition Etiology: chronic illness (COPD, dementia) Signs/Symptoms: severe fat depletion, severe muscle depletion Interventions: Tube feeding  DVT prophylaxis: enoxaparin (LOVENOX) injection 30 mg Start: 07/02/22 1000 Code Status:   Code Status: DNR Family Communication: plan of care discussed with patient/no family  at bedside. Patient status is:  inpatient  because of awaiting SNF Patient's granddaughter was able to be reached and has been updated yesterday about the fall, also waiting for placement  Level of care: Progressive  Dispo:            Anticipated disposition:SNF.  She will need 48 hr sitter/restraint free snf to consider per TOC  Objective: Vitals last 24  hrs: Vitals:   07/25/22 1931 07/26/22 0606 07/26/22 0800 07/26/22 0955  BP: (!) 155/80 (!) 155/84  (!) 155/85  Pulse: 99 93  90  Resp:  20    Temp: 97.9 F (36.6 C) (!) 96.4 F (35.8 C) 98.2 F (36.8 C)   TempSrc: Oral Axillary Oral   SpO2: 90% 97%    Weight:  36.4 kg    Height:       Weight change: -3.5 kg  Physical Examination: General exam: AAOX102, weak,older appearing HEENT:Oral mucosa moist, Ear/Nose WNL grossly, dentition normal. Respiratory system: bilaterally CLEAR BS, no use of accessory muscle Cardiovascular system: S1 & S2 +, regular rate. Gastrointestinal system: Abdomen soft, NT,ND,BS+ Nervous System:Alert, awake, moving extremities and grossly nonfocal Extremities: LE ankle edema neg, lower extremities warm Skin: No rashes,no icterus. MSK: Normal muscle bulk,tone, power   Medications reviewed:  Scheduled Meds:  amLODipine  5 mg Oral Daily   arformoterol  15 mcg Nebulization BID   budesonide (PULMICORT) nebulizer solution  0.5 mg Nebulization BID   carvedilol  3.125 mg Oral BID WC   Chlorhexidine Gluconate Cloth  6 each Topical Daily   clonazePAM  0.5 mg Oral Daily   clonazePAM  0.5 mg Oral QHS   enoxaparin (LOVENOX) injection  30 mg Subcutaneous Daily   feeding supplement  237 mL Oral BID BM   mupirocin cream  Topical BID   nutrition supplement (JUVEN)  1 packet Oral BID BM   mouth rinse  15 mL Mouth Rinse 4 times per day   mouth rinse  15 mL Mouth Rinse 4 times per day   pantoprazole  40 mg Oral Daily   potassium chloride  40 mEq Oral Once   QUEtiapine  50 mg Oral QHS   revefenacin  175 mcg Nebulization Daily   sodium chloride flush  10-40 mL Intracatheter Q12H  Continuous Infusions:  sodium chloride 10 mL/hr at 07/01/22 0503   dextrose 5 % and 0.45% NaCl Stopped (07/21/22 1900)    Diet Order             DIET DYS 3 Room service appropriate? Yes with Assist; Fluid consistency: Thin  Diet effective now                 Nutrition Problem:  Severe Malnutrition Etiology: chronic illness (COPD, dementia) Signs/Symptoms: severe fat depletion, severe muscle depletion Interventions: Tube feeding   Intake/Output Summary (Last 24 hours) at 07/26/2022 1138 Last data filed at 07/26/2022 1030 Gross per 24 hour  Intake 600 ml  Output 650 ml  Net -50 ml   Net IO Since Admission: 6,190.16 mL [07/26/22 1138]  Wt Readings from Last 3 Encounters:  07/26/22 36.4 kg     Unresulted Labs (From admission, onward)     Start     Ordered   06/12/22 1810  Miscellaneous test (send-out)  Once,   R        06/12/22 1810          Data Reviewed: I have personally reviewed following labs and imaging studies CBC: Recent Labs  Lab 07/20/22 0206 07/21/22 0219 07/23/22 0249  WBC 6.9 6.0 7.6  HGB 12.6 12.7 12.4  HCT 40.1 38.4 38.8  MCV 97.1 94.6 94.9  PLT 305 282 267    Basic Metabolic Panel: Recent Labs  Lab 07/20/22 0206 07/21/22 0219 07/23/22 0249 07/24/22 0954  NA 137 138 136 138  K 3.3* 3.1* 3.2* 3.3*  CL 106 105 104 103  CO2 24 24 23 28   GLUCOSE 104* 127* 126* 131*  BUN 13 10 25* 18  CREATININE 0.71 0.73 0.69 0.76  CALCIUM 9.5 9.6 9.8 10.0  MG 2.0 2.0 2.0  --   PHOS 2.5 2.4* 2.5  --    No results found for this or any previous visit (from the past 240 hour(s)).  Antimicrobials: Anti-infectives (From admission, onward)    Start     Dose/Rate Route Frequency Ordered Stop   07/06/22 1000  cefTRIAXone (ROCEPHIN) 2 g in sodium chloride 0.9 % 100 mL IVPB        2 g 200 mL/hr over 30 Minutes Intravenous Every 24 hours 07/06/22 0848 07/08/22 0952   07/05/22 0200  piperacillin-tazobactam (ZOSYN) IVPB 3.375 g  Status:  Discontinued        3.375 g 12.5 mL/hr over 240 Minutes Intravenous Every 8 hours 07/04/22 1826 07/06/22 0848   07/04/22 1915  vancomycin (VANCOCIN) IVPB 1000 mg/200 mL premix  Status:  Discontinued        1,000 mg 200 mL/hr over 60 Minutes Intravenous Every 48 hours 07/04/22 1826 07/05/22 0827   07/04/22  1915  piperacillin-tazobactam (ZOSYN) IVPB 3.375 g        3.375 g 100 mL/hr over 30 Minutes Intravenous  Once 07/04/22 1826 07/04/22 1914   06/21/22 2300  piperacillin-tazobactam (ZOSYN) IVPB 3.375 g  3.375 g 12.5 mL/hr over 240 Minutes Intravenous Every 8 hours 06/21/22 1631 06/29/22 2009   06/21/22 1645  piperacillin-tazobactam (ZOSYN) IVPB 3.375 g  Status:  Discontinued        3.375 g 12.5 mL/hr over 240 Minutes Intravenous Every 8 hours 06/21/22 1630 06/21/22 1631   06/21/22 1645  piperacillin-tazobactam (ZOSYN) IVPB 3.375 g        3.375 g 100 mL/hr over 30 Minutes Intravenous STAT 06/21/22 1631 06/21/22 1900   06/14/22 1315  cefTRIAXone (ROCEPHIN) 2 g in sodium chloride 0.9 % 100 mL IVPB  Status:  Discontinued        2 g 200 mL/hr over 30 Minutes Intravenous Every 24 hours 06/14/22 1249 06/20/22 1415   06/12/22 2230  doxycycline (VIBRAMYCIN) 100 mg in sodium chloride 0.9 % 250 mL IVPB  Status:  Discontinued        100 mg 125 mL/hr over 120 Minutes Intravenous Every 12 hours 06/12/22 2139 06/14/22 1350      Culture/Microbiology    Component Value Date/Time   SDES BLOOD RIGHT ARM 07/04/2022 1812   SPECREQUEST  07/04/2022 1812    BOTTLES DRAWN AEROBIC ONLY Blood Culture adequate volume   CULT  07/04/2022 1812    NO GROWTH 5 DAYS Performed at [facility], [address], [place], [place] [address]    REPTSTATUS 07/09/2022 FINAL 07/04/2022 1812  Radiology Studies: CT HEAD WO CONTRAST ( )  Result Date: 07/25/2022 CLINICAL DATA:  Head trauma, minor (Age >= 65y) EXAM: CT HEAD WITHOUT CONTRAST TECHNIQUE: Contiguous axial images were obtained from the base of the skull through the vertex without intravenous contrast. RADIATION DOSE REDUCTION: This exam was performed according to the departmental dose-optimization program which includes automated exposure control, adjustment of the mA and/or kV according to patient size and/or use of iterative reconstruction technique.  COMPARISON:  CT head June 12, 2022. FINDINGS: Brain: No evidence of acute infarction, hemorrhage, hydrocephalus, extra-axial collection or mass lesion/mass effect. Patchy white matter hypodensities, compatible with chronic microvascular ischemic change. Cerebral atrophy. Vascular: No hyperdense vessel identified. Skull: No acute fracture. Sinuses/Orbits: Small amount of frothy secretions in the left sphenoid sinus. Otherwise, largely clear sinuses. No acute orbital findings. Other: No mastoid effusions. IMPRESSION: No evidence of acute intracranial abnormality. Electronically Signed   By: Feliberto Harts M.D.   On: 07/25/2022 17:34     LOS: 44 days   Lanae Boast, MD [facility]  07/26/2022, 11:38 AM

## 2022-07-26 NOTE — TOC Progression Note (Incomplete Revision)
Transition of Care [facility]) - Progression Note    Patient Details  Name: GERMAINE SITZES MRN: [id number] Date of Birth: 06/10/1945  Transition of Care [facility] CM/SW Contact  Delilah Shan, LCSWA Phone Number: 07/26/2022, 11:15 AM  Clinical Narrative:     CSW informed patient has sitter and restraints. CSW following to refax patient out for SNF when appropriate. Patient will need to be restraint free chemical and physical and sitter free without incident for 48 hours. CSW will continue to follow and assist with patients dc planning needs.  Expected Discharge Plan: Skilled Nursing Facility Barriers to Discharge: Continued Medical Work up  Expected Discharge Plan and Services   Discharge Planning Services: CM Consult Post Acute Care Choice: Long Term Acute Care (LTAC)                                         Social Determinants of Health (SDOH) Interventions    Readmission Risk Interventions    07/22/2022    1:43 PM  Readmission Risk Prevention Plan  Transportation Screening Complete  Palliative Care Screening Complete

## 2022-07-27 DIAGNOSIS — J9602 Acute respiratory failure with hypercapnia: Secondary | ICD-10-CM | POA: Diagnosis not present

## 2022-07-27 DIAGNOSIS — J9601 Acute respiratory failure with hypoxia: Secondary | ICD-10-CM | POA: Diagnosis not present

## 2022-07-27 NOTE — Progress Notes (Addendum)
PROGRESS NOTE Carla [name]  ZOX:[id number] DOB: 1945-07-20 DOA: 06/12/2022 PCP: Patient, [name]  Brief Narrative/Hospital Course: 35 yof w/ COPD/emphysema tobacco use,Hypertension, Dementia, Anxiety, Depression, GERD, Primary hyperparathyroidism, Vit D deficiency, HLD, Osteoporosis , found by family unresponsive and brought to ER on 06/12/22 and found to have acute on chronic hypercapnia on ABG>Intubated in ER and treated for COPD exacerbation. She had prolonged complicated hospitalization charted as below with multiple ICU admissions, needing tracheostomy placement, having encephalopathy with baseline dementia and pulling out her PICC line tracheostomy.   Significant Events  3/20 Admitted after being found unresponsive, intubated on ED arrival  CT angio chest >> atherosclerosis, severe coronary calcification, severe LVH, mild centrilobular emphysema, 2 mm nodule LUL, 4 mm nodule LUL Blood culture  >> Haemophilus influenzae 3/22 Had oxygen desaturation and low Vt with SBT this morning. Echo 06/14/22 >> EF 60 to 65%, mod LVH, grade 1 DD, mild/mod AR 3/25 bronch for peak pressures, low tidal volumes with mucus cast at end of ETT, thick secretions caking tube 3/29 Bronched again with purulent secretions LLL with plugging and LLL atelectasis, some increase in O2 requirement on vent, zosyn started for VAP coverage. MRSA nare swab today negative.  3/30 Remains intubated with waxing and weaning agitation on fentanyl and precedex  3/31 Remains on vent with continued issues with agitation when sedation lightened  4/2 family meeting, made DNR, plans for one way extubation 4/5 4/5-no overnight events, plan is for one-way extubation today 4/6-family changes mind regarding one-way extubation, plan will be for tracheostomy 4/8 trach placed 4/10 failed overnight weaning trial, back on PRVC and sedation  was added. Subsequently sedation weaned and enteral agents were dc and or decreased.  4/11 failed psv  w some distress. Fever. Abx added 4/12 cont efforts at psv. Most awake she has been in days   4/13 Tolerating PS.  4/15 Has been on ATC x 1:45 minutes, RR 40 and accessory muscle use, placing back on vent 4/18 Cortrak clogged and thus replaced. Still working on weaning completed CTx for PNA. 48 hrs on ATC  4/19 off vent x 72 hours so removed from room.  Planning on changing trach to cuffless.  Continuing to work with PMV.  Hypoglycemia adding D5 to maintenance fluids 4/20 transfer out of ICU  4/21 transfer to [facility].  4/26: Patient pulled tracheostomy tube.  4/28: Patient pulled PICC line, PIV inserted, Soft restraints. 4/30 : PCCM signed off-she is doing well without the tracheostomy     Subjective: Patient seen and examined this morning Sitter at the bedside> reports patient ate almost 90 to 100% of her meal this morning and just gotten her clonazepam and sleeping/resting. Moves all extremities to pain Overnight afebrile BP stable  Getting PRN Atarax, Tylenol, on IVF Indwelling Foley catheter removed overnight due to malodorous sediment  Assessment and Plan: Principal Problem:   Acute respiratory failure with hypoxia and hypercapnia (HCC) Active Problems:   COPD with exacerbation (HCC)   Hyperglycemia   Acute metabolic encephalopathy   HTN (hypertension)   Tobacco abuse   Anxiety   Peripheral vascular disease (HCC)   Pulmonary nodules   Dementia (HCC)   Protein-calorie malnutrition, severe (HCC)   Urinary retention   Hypervolemia   Encephalopathy acute   Tracheostomy in place (HCC)   AKI (acute kidney injury) (HCC)   Hypernatremia   Fever   Acute respiratory failure with hypercapnia (HCC)   Hyponatremia   Pressure injury of skin  Acute hypoxic/hypercapnic respiratory failure:  Complicated hospital course w/ intubation, tracheostomy placement>she pulled out trach on 4/26, fortunately doing well without tracheostomy, site is healed.  Monitor respiratory status, doing well  on room air, continue Yupelri, Brovana Pulmicort  Nebs, and bronchodilator PRN  Acute metabolic encephalopathy in the background of dementia Dysphagia: Presented with unresponsiveness s/p extensive workup including  head CT negative for acute abnormality. Felt to be due to respiratory failure and also with ICU delirium.  Initially on core track> not tolerating p.o. seen by speech currently on DYS 3 diet-since eating well with assistance discontinue IV fluids repeat labs in the morning.  She is alert awake oriented to self but continues to get up and high risk for fall needing sitter.  Continue clonazepam, Seroquel qhs.  Discontinue Atarax-try to minimize sedative antihistaminic:  1. Avoid/Minimize benzodiazepines, antihistamines, anticholinergics, and minimize opiate if possible 2: Assess, prevent and manage pain as lack of treatment can result in delirium.  3: Provide appropriate lighting and clear signage; a clock and calendar should be easily visible to the patient. 4: Monitor environmental factors. Reduce light and noise at night (close shades, turn off lights, turn off TV, ect). Correct any alterations in sleep cycle. 5: Reorient the patient to person, place, time and situation on each encounter.  6: Correct sensory deficits if possible (replace eye glasses, hearing aids, ect). 7: Avoid restraints if able. Severely delirious patients benefit from constant observation by a sitter   Found on the floor on 5/2 small bruise on the back of the head:?Hair got caught versus fall. CT head 5/2 no acute finding and no focal on exam no issues. Placed on one to one sitter since then> monitor and see if she can be removed off sitter since patient needs 48 hours of seizure-free to be able to qualify for skilled nursing placement   Type 2 diabetes mellitus controlled on SSILast Hb A1c is 6.0 last month.   Acute kidney injury:Resolved.    Pulmonary nodules:Incidental finding on CT 06/12/2022 - 2 mm and 4 mm  noncalcified left upper lobe lung nodules.  She will need follow-up with CT scan in 12 months   Essential hypertension Bp stabe cont  Norvasc and Coreg  Chronic diastolic CHF currently euvolemic Net IO Since Admission: 6,580.16 mL [07/27/22 1035]   Hypokalemia replaced.Had refused oral intake.   Severe malnutrition augment diet as below RD following Nutrition Problem: Severe Malnutrition Etiology: chronic illness (COPD, dementia) Signs/Symptoms: severe fat depletion, severe muscle depletion Interventions: Tube feeding  DVT prophylaxis: enoxaparin (LOVENOX) injection 30 mg Start: 07/02/22 1000 Code Status:   Code Status: DNR Family Communication: plan of care discussed with patient/no family  at bedside. Patient status is:  inpatient  because of awaiting SNF Patient's granddaughter was able to be reached and has been updated by staffs.  Level of care: Progressive  Dispo:            Anticipated disposition:SNF. She will need 48 hr sitter/restraint free snf to consider per TOC  Objective: Vitals last 24 hrs: Vitals:   07/26/22 2037 07/26/22 2300 07/27/22 0732 07/27/22 0848  BP:   (!) 155/85   Pulse:  (!) 101 90   Resp:   20   Temp:   98.2 F (36.8 C)   TempSrc:   Oral   SpO2: 90% (!) 88% 96% 93%  Weight:      Height:       Weight change:   Physical Examination: General exam: Sleeping but able to respond moving all extremities  HEENT:Oral mucosa moist, Ear/Nose WNL grossly, dentition normal. Respiratory system: bilaterally clear BS, no use of accessory muscle Cardiovascular system: S1 & S2 +, regular rate. Gastrointestinal system: Abdomen soft, NT,ND,BS+ Nervous System:Sleeping but able to respondand moving all extremities  Extremities: LE ankle edema neg, lower extremities warm Skin: No rashes,no icterus. ZOX:WRUEA muscle bulk,tone, power   Medications reviewed:  Scheduled Meds:  amLODipine  5 mg Oral Daily   arformoterol  15 mcg Nebulization BID   budesonide  (PULMICORT) nebulizer solution  0.5 mg Nebulization BID   carvedilol  3.125 mg Oral BID WC   Chlorhexidine Gluconate Cloth  6 each Topical Daily   clonazePAM  0.5 mg Oral Daily   clonazePAM  0.5 mg Oral QHS   enoxaparin (LOVENOX) injection  30 mg Subcutaneous Daily   feeding supplement  237 mL Oral BID BM   mupirocin cream   Topical BID   nutrition supplement (JUVEN)  1 packet Oral BID BM   mouth rinse  15 mL Mouth Rinse 4 times per day   pantoprazole  40 mg Oral Daily   QUEtiapine  50 mg Oral QHS   revefenacin  175 mcg Nebulization Daily  Continuous Infusions:  sodium chloride 10 mL/hr at 07/01/22 0503    Diet Order             DIET DYS 3 Room service appropriate? Yes with Assist; Fluid consistency: Thin  Diet effective now                 Nutrition Problem: Severe Malnutrition Etiology: chronic illness (COPD, dementia) Signs/Symptoms: severe fat depletion, severe muscle depletion Interventions: Tube feeding   Intake/Output Summary (Last 24 hours) at 07/27/2022 1035 Last data filed at 07/27/2022 0830 Gross per 24 hour  Intake 840 ml  Output 450 ml  Net 390 ml  Net IO Since Admission: 6,580.16 mL [07/27/22 1035]  Wt Readings from Last 3 Encounters:  07/26/22 36.4 kg     Unresulted Labs (From admission, onward)     Start     Ordered   07/28/22 0500  Comprehensive metabolic panel  Tomorrow morning,   R       Question:  Specimen collection method  Answer:  Lab=Lab collect   07/27/22 0840   07/28/22 0500  CBC  Tomorrow morning,   R       Question:  Specimen collection method  Answer:  Lab=Lab collect   07/27/22 0840   06/12/22 1810  Miscellaneous test (send-out)  Once,   R        06/12/22 1810          Data Reviewed: I have personally reviewed following labs and imaging studies CBC: Recent Labs  Lab 07/21/22 0219 07/23/22 0249  WBC 6.0 7.6  HGB 12.7 12.4  HCT 38.4 38.8  MCV 94.6 94.9  PLT 282 267   Basic Metabolic Panel: Recent Labs  Lab 07/21/22 0219  07/23/22 0249 07/24/22 0954  NA 138 136 138  K 3.1* 3.2* 3.3*  CL 105 104 103  CO2 24 23 28   GLUCOSE 127* 126* 131*  BUN 10 25* 18  CREATININE 0.73 0.69 0.76  CALCIUM 9.6 9.8 10.0  MG 2.0 2.0  --   PHOS 2.4* 2.5  --   No results found for this or any previous visit (from the past 240 hour(s)).  Antimicrobials: Anti-infectives (From admission, onward)    Start     Dose/Rate Route Frequency Ordered Stop   07/06/22 1000  cefTRIAXone (ROCEPHIN)  2 g in sodium chloride 0.9 % 100 mL IVPB        2 g 200 mL/hr over 30 Minutes Intravenous Every 24 hours 07/06/22 0848 07/08/22 0952   07/05/22 0200  piperacillin-tazobactam (ZOSYN) IVPB 3.375 g  Status:  Discontinued        3.375 g 12.5 mL/hr over 240 Minutes Intravenous Every 8 hours 07/04/22 1826 07/06/22 0848   07/04/22 1915  vancomycin (VANCOCIN) IVPB 1000 mg/200 mL premix  Status:  Discontinued        1,000 mg 200 mL/hr over 60 Minutes Intravenous Every 48 hours 07/04/22 1826 07/05/22 0827   07/04/22 1915  piperacillin-tazobactam (ZOSYN) IVPB 3.375 g        3.375 g 100 mL/hr over 30 Minutes Intravenous  Once 07/04/22 1826 07/04/22 1914   06/21/22 2300  piperacillin-tazobactam (ZOSYN) IVPB 3.375 g        3.375 g 12.5 mL/hr over 240 Minutes Intravenous Every 8 hours 06/21/22 1631 06/29/22 2009   06/21/22 1645  piperacillin-tazobactam (ZOSYN) IVPB 3.375 g  Status:  Discontinued        3.375 g 12.5 mL/hr over 240 Minutes Intravenous Every 8 hours 06/21/22 1630 06/21/22 1631   06/21/22 1645  piperacillin-tazobactam (ZOSYN) IVPB 3.375 g        3.375 g 100 mL/hr over 30 Minutes Intravenous STAT 06/21/22 1631 06/21/22 1900   06/14/22 1315  cefTRIAXone (ROCEPHIN) 2 g in sodium chloride 0.9 % 100 mL IVPB  Status:  Discontinued        2 g 200 mL/hr over 30 Minutes Intravenous Every 24 hours 06/14/22 1249 06/20/22 1415   06/12/22 2230  doxycycline (VIBRAMYCIN) 100 mg in sodium chloride 0.9 % 250 mL IVPB  Status:  Discontinued        100  mg 125 mL/hr over 120 Minutes Intravenous Every 12 hours 06/12/22 2139 06/14/22 1350      Culture/Microbiology    Component Value Date/Time   SDES BLOOD RIGHT ARM 07/04/2022 1812   SPECREQUEST  07/04/2022 1812    BOTTLES DRAWN AEROBIC ONLY Blood Culture adequate volume   CULT  07/04/2022 1812    NO GROWTH 5 DAYS Performed at [facility], [address], [place], [place] [address]    REPTSTATUS 07/09/2022 FINAL 07/04/2022 1812  Radiology Studies: CT HEAD WO CONTRAST ( )  Result Date: 07/25/2022 CLINICAL DATA:  Head trauma, minor (Age >= 65y) EXAM: CT HEAD WITHOUT CONTRAST TECHNIQUE: Contiguous axial images were obtained from the base of the skull through the vertex without intravenous contrast. RADIATION DOSE REDUCTION: This exam was performed according to the departmental dose-optimization program which includes automated exposure control, adjustment of the mA and/or kV according to patient size and/or use of iterative reconstruction technique. COMPARISON:  CT head June 12, 2022. FINDINGS: Brain: No evidence of acute infarction, hemorrhage, hydrocephalus, extra-axial collection or mass lesion/mass effect. Patchy white matter hypodensities, compatible with chronic microvascular ischemic change. Cerebral atrophy. Vascular: No hyperdense vessel identified. Skull: No acute fracture. Sinuses/Orbits: Small amount of frothy secretions in the left sphenoid sinus. Otherwise, largely clear sinuses. No acute orbital findings. Other: No mastoid effusions. IMPRESSION: No evidence of acute intracranial abnormality. Electronically Signed   By: Feliberto Harts M.D.   On: 07/25/2022 17:34     LOS: 45 days   Lanae Boast, MD [facility]  07/27/2022, 10:35 AM

## 2022-07-28 ENCOUNTER — Other Ambulatory Visit: Payer: Self-pay

## 2022-07-28 DIAGNOSIS — J9602 Acute respiratory failure with hypercapnia: Secondary | ICD-10-CM | POA: Diagnosis not present

## 2022-07-28 DIAGNOSIS — J9601 Acute respiratory failure with hypoxia: Secondary | ICD-10-CM | POA: Diagnosis not present

## 2022-07-28 LAB — CBC
Hemoglobin: 13.5 g/dL (ref 12.0–15.0)
MCH: 31.1 pg (ref 26.0–34.0)
MCHC: 32.9 g/dL (ref 30.0–36.0)
Platelets: 267 10*3/uL (ref 150–400)
RBC: 4.34 MIL/uL (ref 3.87–5.11)
RDW: 15.3 % (ref 11.5–15.5)
WBC: 8.7 10*3/uL (ref 4.0–10.5)
nRBC: 0 % (ref 0.0–0.2)

## 2022-07-28 LAB — COMPREHENSIVE METABOLIC PANEL
ALT: 16 U/L (ref 0–44)
AST: 20 U/L (ref 15–41)
Alkaline Phosphatase: 93 U/L (ref 38–126)
Anion gap: 6 (ref 5–15)
BUN: 37 mg/dL — ABNORMAL HIGH (ref 8–23)
Calcium: 9.8 mg/dL (ref 8.9–10.3)
Chloride: 104 mmol/L (ref 98–111)
Creatinine, Ser: 0.8 mg/dL (ref 0.44–1.00)
Potassium: 3.8 mmol/L (ref 3.5–5.1)
Sodium: 135 mmol/L (ref 135–145)
Total Bilirubin: 0.3 mg/dL (ref 0.3–1.2)

## 2022-07-28 NOTE — Progress Notes (Addendum)
PROGRESS NOTE Carla [name]  ZOX:[id number] DOB: [birth year] DOA: 06/12/2022 PCP: Patient, [name]  Brief Narrative/Hospital Course: 65 yof w/ COPD/emphysema tobacco use,Hypertension, Dementia, Anxiety, Depression, GERD, Primary hyperparathyroidism, Vit D deficiency, HLD, Osteoporosis , found by family unresponsive and brought to ER on 06/12/22 and found to have acute on chronic hypercapnia on ABG>Intubated in ER and treated for COPD exacerbation. She had prolonged complicated hospitalization charted as below with multiple ICU admissions, needing tracheostomy placement, having encephalopathy with baseline dementia and pulling out her PICC line tracheostomy.   Significant Events  3/20 Admitted after being found unresponsive, intubated on ED arrival  CT angio chest >> atherosclerosis, severe coronary calcification, severe LVH, mild centrilobular emphysema, 2 mm nodule LUL, 4 mm nodule LUL Blood culture  >> Haemophilus influenzae 3/22 Had oxygen desaturation and low Vt with SBT this morning. Echo 06/14/22 >> EF 60 to 65%, mod LVH, grade 1 DD, mild/mod AR 3/25 bronch for peak pressures, low tidal volumes with mucus cast at end of ETT, thick secretions caking tube 3/29 Bronched again with purulent secretions LLL with plugging and LLL atelectasis, some increase in O2 requirement on vent, zosyn started for VAP coverage. MRSA nare swab today negative.  3/30 Remains intubated with waxing and weaning agitation on fentanyl and precedex  3/31 Remains on vent with continued issues with agitation when sedation lightened  4/2 family meeting, made DNR, plans for one way extubation 4/5 4/5-no overnight events, plan is for one-way extubation today 4/6-family changes mind regarding one-way extubation, plan will be for tracheostomy 4/8 trach placed 4/10 failed overnight weaning trial, back on PRVC and sedation  was added. Subsequently sedation weaned and enteral agents were dc and or decreased.  4/11 failed psv  w some distress. Fever. Abx added 4/12 cont efforts at psv. Most awake she has been in days   4/13 Tolerating PS.  4/15 Has been on ATC x 1:45 minutes, RR 40 and accessory muscle use, placing back on vent 4/18 Cortrak clogged and thus replaced. Still working on weaning completed CTx for PNA. 48 hrs on ATC  4/19 off vent x 72 hours so removed from room.  Planning on changing trach to cuffless.  Continuing to work with PMV.  Hypoglycemia adding D5 to maintenance fluids 4/20 transfer out of ICU  4/21 transfer to [facility].  4/26: Patient pulled tracheostomy tube.  4/28: Patient pulled PICC line, PIV inserted, Soft restraints. 4/30 : PCCM signed off-she is doing well without the tracheostomy     Subjective: Seen this am Aaox1-2 Follows commands and movign all her extremities More talkative Overnight patient remains afebrile BP stable labs reviewed this morning fairly stable CBC BMP although had severely low albumin 2.9 g  Remains with one-to-one sitter at bedside able to feed well with assistance eating almost all her meal Indwelling Foley catheter removed 5/3 overnight  Assessment and Plan: Principal Problem:   Acute respiratory failure with hypoxia and hypercapnia (HCC) Active Problems:   COPD with exacerbation (HCC)   Hyperglycemia   Acute metabolic encephalopathy   HTN (hypertension)   Tobacco abuse   Anxiety   Peripheral vascular disease (HCC)   Pulmonary nodules   Dementia (HCC)   Protein-calorie malnutrition, severe (HCC)   Urinary retention   Hypervolemia   Encephalopathy acute   Tracheostomy in place (HCC)   AKI (acute kidney injury) (HCC)   Hypernatremia   Fever   Acute respiratory failure with hypercapnia (HCC)   Hyponatremia   Pressure injury of skin  Acute hypoxic/hypercapnic respiratory failure: Complicated hospital course w/ intubation, tracheostomy placement>she pulled out trach on 4/26, fortunately doing well without tracheostomy, site is healed.  Monitor  respiratory status, doing well on room air, continue Yupelri, Brovana Pulmicort  Nebs, and bronchodilator PRN  Acute metabolic encephalopathy in the background of dementia Dysphagia: Presented with unresponsiveness s/p extensive workup including  head CT negative for acute abnormality. Felt to be due to respiratory failure and also with ICU delirium.  Initially on core track> not tolerating p.o. seen by speech currently on DYS 3 diet-since eating well with assistance discontinue IV fluids repeat labs in the morning.  She is alert awake oriented to self but continues to get up and high risk for fall needing sitter.  Continue clonazepam, Seroquel qhs.  Discontinue Atarax-try to minimize sedative antihistaminic:  1. Avoid/Minimize benzodiazepines, antihistamines, anticholinergics, and minimize opiate if possible 2: Assess, prevent and manage pain as lack of treatment can result in delirium.  3: Provide appropriate lighting and clear signage; a clock and calendar should be easily visible to the patient. 4: Monitor environmental factors. Reduce light and noise at night (close shades, turn off lights, turn off TV, ect). Correct any alterations in sleep cycle. 5: Reorient the patient to person, place, time and situation on each encounter.  6: Correct sensory deficits if possible (replace eye glasses, hearing aids, ect). 7: Avoid restraints if able. Severely delirious patients benefit from constant observation by a sitter   Found on the floor on 5/2 small bruise on the back of the head:?Hair got caught versus fall. CT head 5/2 no acute finding and no focal on exam no issues. Placed on one to one sitter since then> monitor and see if she can be removed off sitter since patient needs 48 hours of seizure-free to be able to qualify for skilled nursing placement   Type 2 diabetes mellitus controlled on SSILast Hb A1c is 6.0 last month.   Acute kidney injury:Resolved.    Pulmonary nodules:Incidental finding  on CT 06/12/2022 - 2 mm and 4 mm noncalcified left upper lobe lung nodules.  She will need follow-up with CT scan in 12 months   Essential hypertension Bp stabe cont  Norvasc and Coreg  Chronic diastolic CHF currently euvolemic Net IO Since Admission: 7,060.16 mL [07/28/22 0910]   Hypokalemia replaced.Had refused oral intake.   Severe malnutrition augment diet as below RD following Nutrition Problem: Severe Malnutrition Etiology: chronic illness (COPD, dementia) Signs/Symptoms: severe fat depletion, severe muscle depletion Interventions: Tube feeding  DVT prophylaxis: enoxaparin (LOVENOX) injection 30 mg Start: 07/02/22 1000 Code Status:   Code Status: DNR Family Communication: plan of care discussed with patient/no family  at bedside. Patient status is:  inpatient  because of awaiting SNF Patient's granddaughter was able to be reached and has been updated by staffs.  Level of care: Progressive  Dispo:            Anticipated disposition:SNF. She will need 48 hr sitter/restraint free snf to consider per TOC  Objective: Vitals last 24 hrs: Vitals:   07/27/22 2025 07/28/22 0410 07/28/22 0741 07/28/22 0903  BP: 139/84 (!) 146/85 (!) 153/84   Pulse: 96 76 80   Resp: (!) 24 10 20    Temp: 97.9 F (36.6 C) 97.8 F (36.6 C) 98.2 F (36.8 C)   TempSrc:  Oral Oral   SpO2: 94% 94%  93%  Weight:  39 kg    Height:       Weight change:   Physical  Examination: General exam:alert awake, confused,older than stated age HEENT:Oral mucosa moist, Ear/Nose WNL grossly Respiratory system: Bilaterally clear BS,no use of accessory muscle Cardiovascular system: S1 & S2 +, No JVD. Gastrointestinal system: Abdomen soft,NT,ND, BS+ Nervous System: Alert, awake, moving extremities, she follows commands. Extremities: LE edema neg,distal peripheral pulses palpable.  Skin:No rashes,no icterus. UEA:VWUJWJ muscle bulk,tone, power   Medications reviewed:  Scheduled Meds:  amLODipine  5 mg Oral Daily    arformoterol  15 mcg Nebulization BID   budesonide (PULMICORT) nebulizer solution  0.5 mg Nebulization BID   carvedilol  3.125 mg Oral BID WC   Chlorhexidine Gluconate Cloth  6 each Topical Daily   clonazePAM  0.5 mg Oral Daily   clonazePAM  0.5 mg Oral QHS   enoxaparin (LOVENOX) injection  30 mg Subcutaneous Daily   feeding supplement  237 mL Oral BID BM   mupirocin cream   Topical BID   nutrition supplement (JUVEN)  1 packet Oral BID BM   mouth rinse  15 mL Mouth Rinse 4 times per day   pantoprazole  40 mg Oral Daily   QUEtiapine  50 mg Oral QHS   revefenacin  175 mcg Nebulization Daily  Continuous Infusions:  sodium chloride 10 mL/hr at 07/01/22 0503    Diet Order             DIET DYS 3 Room service appropriate? Yes with Assist; Fluid consistency: Thin  Diet effective now                 Nutrition Problem: Severe Malnutrition Etiology: chronic illness (COPD, dementia) Signs/Symptoms: severe fat depletion, severe muscle depletion Interventions: Tube feeding   Intake/Output Summary (Last 24 hours) at 07/28/2022 0910 Last data filed at 07/27/2022 1700 Gross per 24 hour  Intake 480 ml  Output --  Net 480 ml   Net IO Since Admission: 7,060.16 mL [07/28/22 0910]  Wt Readings from Last 3 Encounters:  07/28/22 39 kg     Unresulted Labs (From admission, onward)     Start     Ordered   06/12/22 1810  Miscellaneous test (send-out)  Once,   R        06/12/22 1810          Data Reviewed: I have personally reviewed following labs and imaging studies CBC: Recent Labs  Lab 07/23/22 0249 07/28/22 0148  WBC 7.6 8.7  HGB 12.4 13.5  HCT 38.8 41.0  MCV 94.9 94.5  PLT 267 267    Basic Metabolic Panel: Recent Labs  Lab 07/23/22 0249 07/24/22 0954 07/28/22 0148  NA 136 138 135  K 3.2* 3.3* 3.8  CL 104 103 104  CO2 23 28 25   GLUCOSE 126* 131* 109*  BUN 25* 18 37*  CREATININE 0.69 0.76 0.80  CALCIUM 9.8 10.0 9.8  MG 2.0  --   --   PHOS 2.5  --   --    No  results found for this or any previous visit (from the past 240 hour(s)).  Antimicrobials: Anti-infectives (From admission, onward)    Start     Dose/Rate Route Frequency Ordered Stop   07/06/22 1000  cefTRIAXone (ROCEPHIN) 2 g in sodium chloride 0.9 % 100 mL IVPB        2 g 200 mL/hr over 30 Minutes Intravenous Every 24 hours 07/06/22 0848 07/08/22 0952   07/05/22 0200  piperacillin-tazobactam (ZOSYN) IVPB 3.375 g  Status:  Discontinued        3.375 g 12.5 mL/hr  over 240 Minutes Intravenous Every 8 hours 07/04/22 1826 07/06/22 0848   07/04/22 1915  vancomycin (VANCOCIN) IVPB 1000 mg/200 mL premix  Status:  Discontinued        1,000 mg 200 mL/hr over 60 Minutes Intravenous Every 48 hours 07/04/22 1826 07/05/22 0827   07/04/22 1915  piperacillin-tazobactam (ZOSYN) IVPB 3.375 g        3.375 g 100 mL/hr over 30 Minutes Intravenous  Once 07/04/22 1826 07/04/22 1914   06/21/22 2300  piperacillin-tazobactam (ZOSYN) IVPB 3.375 g        3.375 g 12.5 mL/hr over 240 Minutes Intravenous Every 8 hours 06/21/22 1631 06/29/22 2009   06/21/22 1645  piperacillin-tazobactam (ZOSYN) IVPB 3.375 g  Status:  Discontinued        3.375 g 12.5 mL/hr over 240 Minutes Intravenous Every 8 hours 06/21/22 1630 06/21/22 1631   06/21/22 1645  piperacillin-tazobactam (ZOSYN) IVPB 3.375 g        3.375 g 100 mL/hr over 30 Minutes Intravenous STAT 06/21/22 1631 06/21/22 1900   06/14/22 1315  cefTRIAXone (ROCEPHIN) 2 g in sodium chloride 0.9 % 100 mL IVPB  Status:  Discontinued        2 g 200 mL/hr over 30 Minutes Intravenous Every 24 hours 06/14/22 1249 06/20/22 1415   06/12/22 2230  doxycycline (VIBRAMYCIN) 100 mg in sodium chloride 0.9 % 250 mL IVPB  Status:  Discontinued        100 mg 125 mL/hr over 120 Minutes Intravenous Every 12 hours 06/12/22 2139 06/14/22 1350      Culture/Microbiology    Component Value Date/Time   SDES BLOOD RIGHT ARM 07/04/2022 1812   SPECREQUEST  07/04/2022 1812    BOTTLES DRAWN  AEROBIC ONLY Blood Culture adequate volume   CULT  07/04/2022 1812    NO GROWTH 5 DAYS Performed at [facility], [address] [place], [place] [address]    REPTSTATUS 07/09/2022 FINAL 07/04/2022 1812  Radiology Studies: No results found.   LOS: 46 days   Lanae Boast, MD [facility]  07/28/2022, 9:10 AM

## 2022-07-29 DIAGNOSIS — J9602 Acute respiratory failure with hypercapnia: Secondary | ICD-10-CM | POA: Diagnosis not present

## 2022-07-29 DIAGNOSIS — J9601 Acute respiratory failure with hypoxia: Secondary | ICD-10-CM | POA: Diagnosis not present

## 2022-07-29 NOTE — Progress Notes (Addendum)
PROGRESS NOTE ROSALIA WESTOVER  WUJ:[id number] DOB: July 21, 1945 DOA: 06/12/2022 PCP: Patient, [name]  Brief Narrative/Hospital Course: 77 yof w/ COPD/emphysema tobacco use,Hypertension, Dementia, Anxiety, Depression, GERD, Primary hyperparathyroidism, Vit D deficiency, HLD, Osteoporosis , found by family unresponsive and brought to ER on 06/12/22 and found to have acute on chronic hypercapnia on ABG>Intubated in ER and treated for COPD exacerbation. She had prolonged complicated hospitalization charted as below with multiple ICU admissions, needing tracheostomy placement, having encephalopathy with baseline dementia and pulling out her PICC line tracheostomy.   Significant Events  3/20 Admitted after being found unresponsive, intubated on ED arrival  CT angio chest >> atherosclerosis, severe coronary calcification, severe LVH, mild centrilobular emphysema, 2 mm nodule LUL, 4 mm nodule LUL Blood culture  >> Haemophilus influenzae 3/22 Had oxygen desaturation and low Vt with SBT this morning. Echo 06/14/22 >> EF 60 to 65%, mod LVH, grade 1 DD, mild/mod AR 3/25 bronch for peak pressures, low tidal volumes with mucus cast at end of ETT, thick secretions caking tube 3/29 Bronched again with purulent secretions LLL with plugging and LLL atelectasis, some increase in O2 requirement on vent, zosyn started for VAP coverage. MRSA nare swab today negative.  3/30 Remains intubated with waxing and weaning agitation on fentanyl and precedex  3/31 Remains on vent with continued issues with agitation when sedation lightened  4/2 family meeting, made DNR, plans for one way extubation 4/5 4/5-no overnight events, plan is for one-way extubation today 4/6-family changes mind regarding one-way extubation, plan will be for tracheostomy 4/8 trach placed 4/10 failed overnight weaning trial, back on PRVC and sedation  was added. Subsequently sedation weaned and enteral agents were dc and or decreased.  4/11 failed psv  w some distress. Fever. Abx added 4/12 cont efforts at psv. Most awake she has been in days   4/13 Tolerating PS.  4/15 Has been on ATC x 1:45 minutes, RR 40 and accessory muscle use, placing back on vent 4/18 Cortrak clogged and thus replaced. Still working on weaning completed CTx for PNA. 48 hrs on ATC  4/19 off vent x 72 hours so removed from room.  Planning on changing trach to cuffless.  Continuing to work with PMV.  Hypoglycemia adding D5 to maintenance fluids 4/20 transfer out of ICU  4/21 transfer to [facility].  4/26: Patient pulled tracheostomy tube.  4/28: Patient pulled PICC line, PIV inserted, Soft restraints. 4/30 : PCCM signed off-she is doing well without the tracheostomy     Subjective: Patient seen and examined this morning somewhat sleepy  Withdraws to pain opens her eyes  No acute events overnight.  She is afebrile BP stable and doing well on room air Indwelling Foley catheter removed 5/3 overnight  Assessment and Plan: Principal Problem:   Acute respiratory failure with hypoxia and hypercapnia (HCC) Active Problems:   COPD with exacerbation (HCC)   Hyperglycemia   Acute metabolic encephalopathy   HTN (hypertension)   Tobacco abuse   Anxiety   Peripheral vascular disease (HCC)   Pulmonary nodules   Dementia (HCC)   Protein-calorie malnutrition, severe (HCC)   Urinary retention   Hypervolemia   Encephalopathy acute   Tracheostomy in place [facility])   AKI (acute kidney injury) (HCC)   Hypernatremia   Fever   Acute respiratory failure with hypercapnia (HCC)   Hyponatremia   Pressure injury of skin  Acute hypoxic/hypercapnic respiratory failure: Complicated hospital course w/ intubation, tracheostomy placement>she pulled out trach on 4/26, fortunately doing well without  tracheostomy, site is healed.  Currently on room air Continue Yupelri, Brovana Pulmicort  Nebs, and bronchodilator PRN.  Acute metabolic encephalopathy in the background of  dementia Dysphagia: Presented with unresponsiveness s/p extensive workup including  head CT negative for acute abnormality. Felt to be due to respiratory failure and also with ICU delirium.  Initially on core track>not tolerating p.o. seen by speech currently on DYS 3 diet.  Discontinued IV fluids.  She has been alert awake eating well when fed. Remains confused with dementia and high risk for fall needing sitter. Continue clonazepam, Seroquel qhs. Discontinued Atarax-try to minimize sedative antihistaminic.  1. Avoid/Minimize benzodiazepines, antihistamines, anticholinergics, and minimize opiate if possible 2: Assess, prevent and manage pain as lack of treatment can result in delirium.  3: Provide appropriate lighting and clear signage; a clock and calendar should be easily visible to the patient. 4: Monitor environmental factors. Reduce light and noise at night (close shades, turn off lights, turn off TV, ect). Correct any alterations in sleep cycle. 5: Reorient the patient to person, place, time and situation on each encounter.  6: Correct sensory deficits if possible (replace eye glasses, hearing aids, ect). 7: Avoid restraints if able. Severely delirious patients benefit from constant observation by a sitter   Found on the floor on 5/2 small bruise on the back of the head:?Hair got caught versus fall. CT head 5/2 no acute finding and no focal on exam no issues. Placed on one to one sitter since then> monitor and see if she can be removed off sitter since patient needs 48 hours of seizure-free to be able to qualify for skilled nursing placement   Type 2 diabetes mellitus blood sugar is controlled on SSILast Hb A1c is 6.0 last month.   Acute kidney injury:Resolved.    Pulmonary nodules:Incidental finding on CT 06/12/2022 - 2 mm and 4 mm noncalcified left upper lobe lung nodules.  She will need follow-up with CT scan in 12 months   Essential hypertension Bp stabe cont  Norvasc and  Coreg  Chronic diastolic CHF currently euvolemic Net IO Since Admission: 7,060.16 mL [07/29/22 1200]   Hypokalemia resolved   Severe malnutrition augment diet as below RD following Nutrition Problem: Severe Malnutrition Etiology: chronic illness (COPD, dementia) Signs/Symptoms: severe fat depletion, severe muscle depletion Interventions: Tube feeding  DVT prophylaxis: enoxaparin (LOVENOX) injection 30 mg Start: 07/02/22 1000 Code Status:   Code Status: DNR Family Communication: plan of care discussed with patient/no family  at bedside. Patient status is:  inpatient  because of awaiting SNF Patient's granddaughter was able to be reached and has been updated by staffs.  I discussed w/ RN for disposition/sitter issues-no sitter today/used bedside commode walked back to bed and walked 30 ft with PT TODAY. They are continuing to monitor. Someone from family visited today.  Has mat on both sides and bed alarm on.   Level of care: Progressive  Dispo:Anticipated disposition: SNF pending.She will need 48 hr sitter/restraint free snf to consider per TOC, but she is at high risk for fall.Difficult disposition.  Objective: Vitals last 24 hrs: Vitals:   07/28/22 2104 07/29/22 0619 07/29/22 0740 07/29/22 0823  BP: (!) 159/79 (!) 150/82  133/72  Pulse: 82 77  81  Resp: 20 20    Temp:  (!) 96.3 F (35.7 C)    TempSrc:  Axillary    SpO2: 97% 93% 94%   Weight:  38.1 kg    Height:       Weight change: -0.9  kg  Physical Examination: General exam:Mildly sleepy but able to wake up move all her extremities  HEENT:Oral mucosa moist, Ear/Nose WNL grossly, dentition normal. Respiratory system: bilaterally clear BS, tracheostomy site healed,no use of accessory muscle Cardiovascular system: S1 & S2 +, regular rate, JVD neg. Gastrointestinal system: Abdomen soft,NT,ND,BS+ Nervous System:moving extremities and grossly nonfocal Extremities: LE ankle edema neg, lower extremities warm Skin:No  rashes,no icterus. NWG:NFAOZH muscle bulk,tone, power   Medications reviewed:  Scheduled Meds:  amLODipine  5 mg Oral Daily   arformoterol  15 mcg Nebulization BID   budesonide (PULMICORT) nebulizer solution  0.5 mg Nebulization BID   carvedilol  3.125 mg Oral BID WC   clonazePAM  0.5 mg Oral Daily   clonazePAM  0.5 mg Oral QHS   enoxaparin (LOVENOX) injection  30 mg Subcutaneous Daily   feeding supplement  237 mL Oral BID BM   mupirocin cream   Topical BID   nutrition supplement (JUVEN)  1 packet Oral BID BM   mouth rinse  15 mL Mouth Rinse 4 times per day   pantoprazole  40 mg Oral Daily   QUEtiapine  50 mg Oral QHS   revefenacin  175 mcg Nebulization Daily  Continuous Infusions:  sodium chloride 10 mL/hr at 07/01/22 0503    Diet Order             DIET DYS 3 Room service appropriate? Yes with Assist; Fluid consistency: Thin  Diet effective now                 Nutrition Problem: Severe Malnutrition Etiology: chronic illness (COPD, dementia) Signs/Symptoms: severe fat depletion, severe muscle depletion Interventions: Tube feeding  No intake or output data in the 24 hours ending 07/29/22 1200 Net IO Since Admission: 7,060.16 mL [07/29/22 1200]  Wt Readings from Last 3 Encounters:  07/29/22 38.1 kg     Unresulted Labs (From admission, onward)     Start     Ordered   06/12/22 1810  Miscellaneous test (send-out)  Once,   R        06/12/22 1810          Data Reviewed: I have personally reviewed following labs and imaging studies CBC: Recent Labs  Lab 07/23/22 0249 07/28/22 0148  WBC 7.6 8.7  HGB 12.4 13.5  HCT 38.8 41.0  MCV 94.9 94.5  PLT 267 267    Basic Metabolic Panel: Recent Labs  Lab 07/23/22 0249 07/24/22 0954 07/28/22 0148  NA 136 138 135  K 3.2* 3.3* 3.8  CL 104 103 104  CO2 23 28 25   GLUCOSE 126* 131* 109*  BUN 25* 18 37*  CREATININE 0.69 0.76 0.80  CALCIUM 9.8 10.0 9.8  MG 2.0  --   --   PHOS 2.5  --   --    No results found for  this or any previous visit (from the past 240 hour(s)).  Antimicrobials: Anti-infectives (From admission, onward)    Start     Dose/Rate Route Frequency Ordered Stop   07/06/22 1000  cefTRIAXone (ROCEPHIN) 2 g in sodium chloride 0.9 % 100 mL IVPB        2 g 200 mL/hr over 30 Minutes Intravenous Every 24 hours 07/06/22 0848 07/08/22 0952   07/05/22 0200  piperacillin-tazobactam (ZOSYN) IVPB 3.375 g  Status:  Discontinued        3.375 g 12.5 mL/hr over 240 Minutes Intravenous Every 8 hours 07/04/22 1826 07/06/22 0848   07/04/22 1915  vancomycin (VANCOCIN)  IVPB 1000 mg/200 mL premix  Status:  Discontinued        1,000 mg 200 mL/hr over 60 Minutes Intravenous Every 48 hours 07/04/22 1826 07/05/22 0827   07/04/22 1915  piperacillin-tazobactam (ZOSYN) IVPB 3.375 g        3.375 g 100 mL/hr over 30 Minutes Intravenous  Once 07/04/22 1826 07/04/22 1914   06/21/22 2300  piperacillin-tazobactam (ZOSYN) IVPB 3.375 g        3.375 g 12.5 mL/hr over 240 Minutes Intravenous Every 8 hours 06/21/22 1631 06/29/22 2009   06/21/22 1645  piperacillin-tazobactam (ZOSYN) IVPB 3.375 g  Status:  Discontinued        3.375 g 12.5 mL/hr over 240 Minutes Intravenous Every 8 hours 06/21/22 1630 06/21/22 1631   06/21/22 1645  piperacillin-tazobactam (ZOSYN) IVPB 3.375 g        3.375 g 100 mL/hr over 30 Minutes Intravenous STAT 06/21/22 1631 06/21/22 1900   06/14/22 1315  cefTRIAXone (ROCEPHIN) 2 g in sodium chloride 0.9 % 100 mL IVPB  Status:  Discontinued        2 g 200 mL/hr over 30 Minutes Intravenous Every 24 hours 06/14/22 1249 06/20/22 1415   06/12/22 2230  doxycycline (VIBRAMYCIN) 100 mg in sodium chloride 0.9 % 250 mL IVPB  Status:  Discontinued        100 mg 125 mL/hr over 120 Minutes Intravenous Every 12 hours 06/12/22 2139 06/14/22 1350      Culture/Microbiology    Component Value Date/Time   SDES BLOOD RIGHT ARM 07/04/2022 1812   SPECREQUEST  07/04/2022 1812    BOTTLES DRAWN AEROBIC ONLY Blood  Culture adequate volume   CULT  07/04/2022 1812    NO GROWTH 5 DAYS Performed at [facility], [address], [place], [place] [address]    REPTSTATUS 07/09/2022 FINAL 07/04/2022 1812  Radiology Studies: No results found.   LOS: 47 days   Lanae Boast, MD [facility]  07/29/2022, 12:00 PM

## 2022-07-29 NOTE — TOC Progression Note (Incomplete Revision)
Transition of Care [facility]) - Progression Note    Patient Details  Name: Carla [name] MRN: [id number] Date of Birth: September 09, 1945  Transition of Care [facility]) CM/SW Contact  Delilah Shan, LCSWA Phone Number: 07/29/2022, 3:08 PM  Clinical Narrative:     CSW plans to refax patient out for possible SNF placement tomorrow. CSW LVM for patients Granddaughter [name] . CSW awaiting call back to follow up and discuss dc plan/status of SNF placement. CSW will continue to follow and assist with patients dc planning needs.  CSW received call back from patients Granddaughter [name]. [name] in agreement for CSW to fax out patient for SNF placement tomorrow.CSW and patients Granddaughter discussed plans for patient after rehab. Patients Granddaughter is hopeful patient will be able to return home.   Patients Granddaughter informed CSW patient currently does not have Medicaid.Patients Granddaughter informed CSW she plans on looking into reapplying for medicaid to help assist with any future LTC needs for patient. All questions answered. No further questions reported at this time. CSW will follow up with patients Granddaughter when SNF bed offers are in.    Expected Discharge Plan: Skilled Nursing Facility Barriers to Discharge: Continued Medical Work up  Expected Discharge Plan and Services   Discharge Planning Services: CM Consult Post Acute Care Choice: Long Term Acute Care (LTAC)                                         Social Determinants of Health (SDOH) Interventions SDOH Screenings   Food Insecurity: No Food Insecurity (07/28/2022)  Housing: Low Risk  (07/28/2022)  Transportation Needs: No Transportation Needs (07/28/2022)  Utilities: Not At Risk (07/28/2022)  Tobacco Use: High Risk (07/28/2022)    Readmission Risk Interventions    07/22/2022    1:43 PM  Readmission Risk Prevention Plan  Transportation Screening Complete  Palliative Care Screening Complete

## 2022-07-29 NOTE — TOC Progression Note (Addendum)
Transition of Care [facility]) - Progression Note    Patient Details  Name: Carla [name] MRN: [id number] Date of Birth: September 09, 1945  Transition of Care [facility]) CM/SW Contact  Delilah Shan, LCSWA Phone Number: 07/29/2022, 3:08 PM  Clinical Narrative:     CSW plans to refax patient out for possible SNF placement tomorrow. CSW LVM for patients Granddaughter Carla [name] . CSW awaiting call back to follow up and discuss dc plan/status of SNF placement. CSW will continue to follow and assist with patients dc planning needs.  CSW received call back from patients Granddaughter Carla [name]. Carla [name] in agreement for CSW to fax out patient for SNF placement tomorrow.CSW and patients Granddaughter discussed plans for patient after rehab. Patients Granddaughter is hopeful patient will be able to return home.   Patients Granddaughter informed CSW patient currently does not have Medicaid.Patients Granddaughter informed CSW she plans on looking into reapplying for medicaid to help assist with any future LTC needs for patient. All questions answered. No further questions reported at this time. CSW will follow up with patients Granddaughter when SNF bed offers are in.    Expected Discharge Plan: Skilled Nursing Facility Barriers to Discharge: Continued Medical Work up  Expected Discharge Plan and Services   Discharge Planning Services: CM Consult Post Acute Care Choice: Long Term Acute Care (LTAC)                                         Social Determinants of Health (SDOH) Interventions SDOH Screenings   Food Insecurity: No Food Insecurity (07/28/2022)  Housing: Low Risk  (07/28/2022)  Transportation Needs: No Transportation Needs (07/28/2022)  Utilities: Not At Risk (07/28/2022)  Tobacco Use: High Risk (07/28/2022)    Readmission Risk Interventions    07/22/2022    1:43 PM  Readmission Risk Prevention Plan  Transportation Screening Complete  Palliative Care Screening Complete

## 2022-07-29 NOTE — Progress Notes (Addendum)
Physical Therapy Treatment Patient Details Name:  C  MRN:  DOB:  Today's Date: 07/29/2022   History of Present Illness 77 yo female admitted 3/20 unresponsive from home with acute respiratory failure with hypoxia and hypercapnia and metabolic encephalopathy. Intubated 3/20. trach 4/8. Pt self decannulated on 4/29. Pt with fall on 5/2. PMhx: emphysema, tobacco abuse, PVD, HTN, prediabetes, anxiety, advanced dementia.    PT Comments    Pt making slow, steady progress with mobility through repetition. Patient will benefit from continued post acute therapy, <3 hours/day    Recommendations for follow up therapy are one component of a multi-disciplinary discharge planning process, led by the attending physician.  Recommendations may be updated based on patient status, additional functional criteria and insurance authorization.  Follow Up Recommendations  Can patient physically be transported by private vehicle: No    Assistance Recommended at Discharge Frequent or constant Supervision/Assistance  Patient can return home with the following Direct supervision/assist for medications management;Assistance with cooking/housework;Assist for transportation;A lot of help with bathing/dressing/bathroom;Two people to help with walking and/or transfers;Help with stairs or ramp for entrance   Equipment Recommendations  Hospital bed;Wheelchair (measurements PT);Wheelchair cushion (measurements PT)    Recommendations for Other Services       Precautions / Restrictions Precautions Precautions: Fall Restrictions Weight Bearing Restrictions: No     Mobility  Bed Mobility Overal bed mobility: Needs Assistance Bed Mobility: Supine to Sit, Sit to Supine     Supine to sit: Total assist, HOB elevated Sit to supine: Mod assist   General bed mobility comments: Total assist to come to sitting due to initial lethargy    Transfers Overall transfer level: Needs  assistance Equipment used: Rolling walker (2 wheels), 1 person hand held assist Transfers: Sit to/from Stand, Bed to chair/wheelchair/BSC Sit to Stand: Mod assist   Step pivot transfers: Mod assist       General transfer comment: Assist to power up and for balance. Chair to bed with bilateral shelf arm support for small pivotal steps    Ambulation/Gait Ambulation/Gait assistance: Mod assist Gait Distance (Feet): 35 Feet (35' x 1, 5' x 1) Assistive device: Rolling walker (2 wheels) Gait Pattern/deviations: Step-through pattern, Decreased step length - right, Decreased step length - left, Narrow base of support, Trunk flexed, Shuffle, Step-to pattern, Knee flexed in stance - right, Knee flexed in stance - left Gait velocity: decr Gait velocity interpretation: <1.31 ft/sec, indicative of household ambulator   General Gait Details: Assist for balance and support. 2nd person for close chair follow to allow sitting rest breaks   Stairs             Wheelchair Mobility    Modified Rankin (Stroke Patients Only)       Balance Overall balance assessment: Needs assistance Sitting-balance support: Feet supported, Bilateral upper extremity supported Sitting balance-Leahy Scale: Poor Sitting balance - Comments: Intermittent UE support   Standing balance support: Bilateral upper extremity supported Standing balance-Leahy Scale: Poor Standing balance comment: walker and min assist for static standing                            Cognition Arousal/Alertness: Awake/alert, Lethargic Behavior During Therapy: Flat affect Overall Cognitive Status: Impaired/Different from baseline Area of Impairment: Attention, Following commands, Problem solving, Orientation, Memory, Safety/judgement, Awareness                 Orientation Level: Disoriented to, Place, Time, Situation Current Attention Level: Focused Memory:   Decreased short-term memory, Decreased recall of  precautions Following Commands: Follows one step commands inconsistently Safety/Judgement: Decreased awareness of safety, Decreased awareness of deficits Awareness: Intellectual Problem Solving: Requires verbal cues, Requires tactile cues, Difficulty sequencing, Decreased initiation, Slow processing General Comments: Initially asleep and did not arouse to voice/touch. Sat pt up and she woke up and was alert throughout rest of session. Follows some basic 1 step commands with multimodal cues. Pt with significant dementia at baseline.        Exercises      General Comments        Pertinent Vitals/Pain Pain Assessment Pain Assessment: Faces Faces Pain Scale: No hurt    Home Living                          Prior Function            PT Goals (current goals can now be found in the care plan section) Acute Rehab PT Goals Patient Stated Goal: Unable to state Progress towards PT goals: Progressing toward goals    Frequency    Min 1X/week      PT Plan Current plan remains appropriate    Co-evaluation              AM-PAC PT "6 Clicks" Mobility   Outcome Measure  Help needed turning from your back to your side while in a flat bed without using bedrails?: A Little Help needed moving from lying on your back to sitting on the side of a flat bed without using bedrails?: A Lot Help needed moving to and from a bed to a chair (including a wheelchair)?: A Lot Help needed standing up from a chair using your arms (e.g., wheelchair or bedside chair)?: A Lot Help needed to walk in hospital room?: A Lot Help needed climbing 3-5 steps with a railing? : Total 6 Click Score: 12    End of Session Equipment Utilized During Treatment: Gait belt Activity Tolerance: Patient tolerated treatment well Patient left: in bed;with call bell/phone within reach;with bed alarm set Nurse Communication: Mobility status PT Visit Diagnosis: Other abnormalities of gait and mobility  (R26.89);Muscle weakness (generalized) (M62.81);Difficulty in walking, not elsewhere classified (R26.2)     Time: 1057-1112 PT Time Calculation (min) (ACUTE ONLY): 15 min  Charges:  $Gait Training: 8-22 mins                     Talullah Abate PT Acute Rehabilitation Services Office [phone]    Jeananne Bedwell W [place]cok 07/29/2022, 1:13 PM   

## 2022-07-30 DIAGNOSIS — J9601 Acute respiratory failure with hypoxia: Secondary | ICD-10-CM | POA: Diagnosis not present

## 2022-07-30 DIAGNOSIS — J9602 Acute respiratory failure with hypercapnia: Secondary | ICD-10-CM | POA: Diagnosis not present

## 2022-07-30 NOTE — Progress Notes (Addendum)
PROGRESS NOTE Carla [name]  ZOX:[id number] DOB: 1945/04/28 DOA: 06/12/2022 PCP: Patient, [name]  Brief Narrative/Hospital Course: 87 yof w/ COPD/emphysema tobacco use,Hypertension, Dementia, Anxiety, Depression, GERD, Primary hyperparathyroidism, Vit D deficiency, HLD, Osteoporosis , found by family unresponsive and brought to ER on 06/12/22 and found to have acute on chronic hypercapnia on ABG>Intubated in ER and treated for COPD exacerbation. She had prolonged complicated hospitalization charted as below with multiple ICU admissions, needing tracheostomy placement, having encephalopathy with baseline dementia and pulling out her PICC line tracheostomy.   Significant Events  3/20 Admitted after being found unresponsive, intubated on ED arrival  CT angio chest >> atherosclerosis, severe coronary calcification, severe LVH, mild centrilobular emphysema, 2 mm nodule LUL, 4 mm nodule LUL Blood culture  >> Haemophilus influenzae 3/22 Had oxygen desaturation and low Vt with SBT this morning. Echo 06/14/22 >> EF 60 to 65%, mod LVH, grade 1 DD, mild/mod AR 3/25 bronch for peak pressures, low tidal volumes with mucus cast at end of ETT, thick secretions caking tube 3/29 Bronched again with purulent secretions LLL with plugging and LLL atelectasis, some increase in O2 requirement on vent, zosyn started for VAP coverage. MRSA nare swab today negative.  3/30 Remains intubated with waxing and weaning agitation on fentanyl and precedex  3/31 Remains on vent with continued issues with agitation when sedation lightened  4/2 family meeting, made DNR, plans for one way extubation 4/5 4/5-no overnight events, plan is for one-way extubation today 4/6-family changes mind regarding one-way extubation, plan will be for tracheostomy 4/8 trach placed 4/10 failed overnight weaning trial, back on PRVC and sedation  was added. Subsequently sedation weaned and enteral agents were dc and or decreased.  4/11 failed psv  w some distress. Fever. Abx added 4/12 cont efforts at psv. Most awake she has been in days   4/13 Tolerating PS.  4/15 Has been on ATC x 1:45 minutes, RR 40 and accessory muscle use, placing back on vent 4/18 Cortrak clogged and thus replaced. Still working on weaning completed CTx for PNA. 48 hrs on ATC  4/19 off vent x 72 hours so removed from room.  Planning on changing trach to cuffless.  Continuing to work with PMV.  Hypoglycemia adding D5 to maintenance fluids 4/20 transfer out of ICU  4/21 [facility] to [facility].  4/26: Patient pulled tracheostomy tube.  4/28: Patient pulled PICC line, PIV inserted, Soft restraints. 4/30 : PCCM signed off-she is doing well without the tracheostomy     Subjective: Patient seen and examined this morning.   Alert awake,follows some commands, moving all extremities. Spoke w/ RN NOT ON RESTRAINT  Assessment and Plan: Principal Problem:   Acute respiratory failure with hypoxia and hypercapnia (HCC) Active Problems:   COPD with exacerbation (HCC)   Hyperglycemia   Acute metabolic encephalopathy   HTN (hypertension)   Tobacco abuse   Anxiety   Peripheral vascular disease (HCC)   Pulmonary nodules   Dementia (HCC)   Protein-calorie malnutrition, severe (HCC)   Urinary retention   Hypervolemia   Encephalopathy acute   Tracheostomy in place [name])   AKI (acute kidney injury) (HCC)   Hypernatremia   Fever   Acute respiratory failure with hypercapnia (HCC)   Hyponatremia   Pressure injury of skin  Acute hypoxic/hypercapnic respiratory failure: Complicated hospital course w/ intubation, tracheostomy placement>she pulled out trach on 4/26, fortunately doing well without tracheostomy, site is healed.  Currently on room air Continue Yupelri, Brovana Pulmicort  Nebs, and bronchodilator  PRN.  Acute metabolic encephalopathy in the background of dementia Dysphagia: Presented with unresponsiveness s/p extensive workup including  head CT negative for acute  abnormality. Felt to be due to respiratory failure and also with ICU delirium.  Initially on core track>now tolerating p.o. seen by speech currently on DYS 3 diet.   Off IVF.she is alert awake eating with assistance, moving all extremities BUT is confused with dementia and high risk for fall.Continue clonazepam, Seroquel qhs. Discontinued Atarax-try to minimize sedative antihistaminic.  1. Avoid/Minimize benzodiazepines, antihistamines, anticholinergics, and minimize opiate if possible 2: Assess, prevent and manage pain as lack of treatment can result in delirium.  3: Provide appropriate lighting and clear signage; a clock and calendar should be easily visible to the patient. 4: Monitor environmental factors. Reduce light and noise at night (close shades, turn off lights, turn off TV, ect). Correct any alterations in sleep cycle. 5: Reorient the patient to person, place, time and situation on each encounter.  6: Correct sensory deficits if possible (replace eye glasses, hearing aids, ect). 7: Avoid restraints if able. Severely delirious patients benefit from constant observation by a sitter   Found on the floor on 5/2 small bruise on the back of the head:?Hair got caught versus fall. CT head 5/2 no acute finding and no focal on exam no issues. Placed on one to one sitter since then> monitor and see if she can be removed off sitter since patient needs 48 hours of seizure-free to be able to qualify for skilled nursing placement   Type 2 diabetes mellitus blood sugar is controlled on SSILast Hb A1c is 6.0 last month.  .trg Acute kidney injury:Resolved.    Pulmonary nodules:Incidental finding on CT 06/12/2022 - 2 mm and 4 mm noncalcified left upper lobe lung nodules.  She will need follow-up with CT scan in 12 months   Essential hypertension Bp stabe cont  Norvasc and Coreg  Chronic diastolic CHF currently euvolemic Net IO Since Admission: 7,058.16 mL [07/30/22 1248]   Hypokalemia resolved    Severe malnutrition augment diet as below RD following Nutrition Problem: Severe Malnutrition Etiology: chronic illness (COPD, dementia) Signs/Symptoms: severe fat depletion, severe muscle depletion Interventions: Ensure Enlive (each supplement provides 350kcal and 20 grams of protein), Juven  DVT prophylaxis: enoxaparin (LOVENOX) injection 30 mg Start: 07/02/22 1000 Code Status:   Code Status: DNR Family Communication: plan of care discussed with patient/no family  at bedside. Patient status is:  inpatient  because of awaiting SNF Patient's granddaughter was able to be reached and has been updated by staffs.  Level of care: Progressive  Dispo:Anticipated disposition: SNF pending. Toc has faxed out for bed.  Objective: Vitals last 24 hrs: Vitals:   07/30/22 0513 07/30/22 0551 07/30/22 0831 07/30/22 0842  BP: (!) 113/99  130/60   Pulse: 81  87 82  Resp: 20   18  Temp: (!) 97.4 F (36.3 C)     TempSrc: Oral     SpO2: 97%   94%  Weight:  38.6 kg    Height:       Weight change: 0.5 kg  Physical Examination: General exam: Aaox1-2, weak,older appearing HEENT:Oral mucosa moist, Ear/Nose WNL grossly, dentition normal.Trach healed. Respiratory system:Bilaterally clear BS, no use of accessory muscle Cardiovascular system:S1 & S2 +, regular rate. Gastrointestinal system:Abdomen soft,NT,ND,BS+ Nervous System:Alert,awake, moving extremities and grossly nonfocal Extremities: LE ankle edema neg, lower extremities warm Skin:No rashes,no icterus. QMV:HQIO muscle bulk, low tone, power   Medications reviewed:  Scheduled Meds:  amLODipine  5 mg Oral Daily   arformoterol  15 mcg Nebulization BID   budesonide (PULMICORT) nebulizer solution  0.5 mg Nebulization BID   carvedilol  3.125 mg Oral BID WC   clonazePAM  0.5 mg Oral Daily   clonazePAM  0.5 mg Oral QHS   enoxaparin (LOVENOX) injection  30 mg Subcutaneous Daily   feeding supplement  237 mL Oral BID BM   mupirocin cream   Topical  BID   nutrition supplement (JUVEN)  1 packet Oral BID BM   mouth rinse  15 mL Mouth Rinse 4 times per day   pantoprazole  40 mg Oral Daily   QUEtiapine  50 mg Oral QHS   revefenacin  175 mcg Nebulization Daily  Continuous Infusions:  sodium chloride 10 mL/hr at 07/01/22 0503    Diet Order             DIET DYS 3 Room service appropriate? Yes with Assist; Fluid consistency: Thin  Diet effective now                 Nutrition Problem: Severe Malnutrition Etiology: chronic illness (COPD, dementia) Signs/Symptoms: severe fat depletion, severe muscle depletion Interventions: Ensure Enlive (each supplement provides 350kcal and 20 grams of protein), Juven   Intake/Output Summary (Last 24 hours) at 07/30/2022 1248 Last data filed at 07/30/2022 0500 Gross per 24 hour  Intake --  Output 2 ml  Net -2 ml   Net IO Since Admission: 7,058.16 mL [07/30/22 1248]  Wt Readings from Last 3 Encounters:  07/30/22 38.6 kg     Unresulted Labs (From admission, onward)     Start     Ordered   06/12/22 1810  Miscellaneous test (send-out)  Once,   R        06/12/22 1810          Data Reviewed: I have personally reviewed following labs and imaging studies CBC: Recent Labs  Lab 07/28/22 0148  WBC 8.7  HGB 13.5  HCT 41.0  MCV 94.5  PLT 267    Basic Metabolic Panel: Recent Labs  Lab 07/24/22 0954 07/28/22 0148  NA 138 135  K 3.3* 3.8  CL 103 104  CO2 28 25  GLUCOSE 131* 109*  BUN 18 37*  CREATININE 0.76 0.80  CALCIUM 10.0 9.8   No results found for this or any previous visit (from the past 240 hour(s)).  Antimicrobials: Anti-infectives (From admission, onward)    Start     Dose/Rate Route Frequency Ordered Stop   07/06/22 1000  cefTRIAXone (ROCEPHIN) 2 g in sodium chloride 0.9 % 100 mL IVPB        2 g 200 mL/hr over 30 Minutes Intravenous Every 24 hours 07/06/22 0848 07/08/22 0952   07/05/22 0200  piperacillin-tazobactam (ZOSYN) IVPB 3.375 g  Status:  Discontinued         3.375 g 12.5 mL/hr over 240 Minutes Intravenous Every 8 hours 07/04/22 1826 07/06/22 0848   07/04/22 1915  vancomycin (VANCOCIN) IVPB 1000 mg/200 mL premix  Status:  Discontinued        1,000 mg 200 mL/hr over 60 Minutes Intravenous Every 48 hours 07/04/22 1826 07/05/22 0827   07/04/22 1915  piperacillin-tazobactam (ZOSYN) IVPB 3.375 g        3.375 g 100 mL/hr over 30 Minutes Intravenous  Once 07/04/22 1826 07/04/22 1914   06/21/22 2300  piperacillin-tazobactam (ZOSYN) IVPB 3.375 g        3.375 g 12.5 mL/hr over 240  Minutes Intravenous Every 8 hours 06/21/22 1631 06/29/22 2009   06/21/22 1645  piperacillin-tazobactam (ZOSYN) IVPB 3.375 g  Status:  Discontinued        3.375 g 12.5 mL/hr over 240 Minutes Intravenous Every 8 hours 06/21/22 1630 06/21/22 1631   06/21/22 1645  piperacillin-tazobactam (ZOSYN) IVPB 3.375 g        3.375 g 100 mL/hr over 30 Minutes Intravenous STAT 06/21/22 1631 06/21/22 1900   06/14/22 1315  cefTRIAXone (ROCEPHIN) 2 g in sodium chloride 0.9 % 100 mL IVPB  Status:  Discontinued        2 g 200 mL/hr over 30 Minutes Intravenous Every 24 hours 06/14/22 1249 06/20/22 1415   06/12/22 2230  doxycycline (VIBRAMYCIN) 100 mg in sodium chloride 0.9 % 250 mL IVPB  Status:  Discontinued        100 mg 125 mL/hr over 120 Minutes Intravenous Every 12 hours 06/12/22 2139 06/14/22 1350      Culture/Microbiology    Component Value Date/Time   SDES BLOOD RIGHT ARM 07/04/2022 1812   SPECREQUEST  07/04/2022 1812    BOTTLES DRAWN AEROBIC ONLY Blood Culture adequate volume   CULT  07/04/2022 1812    NO GROWTH 5 DAYS Performed at [facility], [address] [place], [place] [address]    REPTSTATUS 07/09/2022 FINAL 07/04/2022 1812  Radiology Studies: No results found.   LOS: 48 days   Lanae Boast, MD [facility]  07/30/2022, 12:48 PM

## 2022-07-30 NOTE — TOC Progression Note (Addendum)
Transition of Care [facility]) - Progression Note    Patient Details  Name: JALISSIA VADEBONCOEUR MRN: [id number] Date of Birth: 10-Oct-1945  [facility]) CM/SW Contact  Delilah Shan, LCSWA Phone Number: 07/30/2022, 10:33 AM  Clinical Narrative:     CSW refaxed out patient for SNF placement. CSW will continue to follow and assist with patients dc planning needs.  Update- CSW provided patients Granddaughter [name] with SNF bed offers. Patients Granddaughter would like to review and will give CSW call back tomorrow with SNF choice. CSW called Junious Dresser with [employer] and started insurance auth for SNF and PTAR. CSW will add facility choice once decided by patients Granddaughter.CSW will continue to follow.   Expected Discharge Plan: Skilled Nursing Facility Barriers to Discharge: Continued Medical Work up  Expected Discharge Plan and Services   Discharge Planning Services: CM Consult Post Acute Care Choice: Long Term Acute Care (LTAC)                                         Social Determinants of Health (SDOH) Interventions SDOH Screenings   Food Insecurity: No Food Insecurity (07/28/2022)  Housing: Low Risk  (07/28/2022)  Transportation Needs: No Transportation Needs (07/28/2022)  Utilities: Not At Risk (07/28/2022)  Tobacco Use: High Risk (07/28/2022)    Readmission Risk Interventions    07/22/2022    1:43 PM  Readmission Risk Prevention Plan  Transportation Screening Complete  Palliative Care Screening Complete

## 2022-07-30 NOTE — TOC Progression Note (Incomplete Revision)
Transition of Care [facility]) - Progression Note    Patient Details  Name: Carla [name] MRN: [id number] Date of Birth: 10-Oct-1945  Transition of Care [facility]) CM/SW Contact  Delilah Shan, LCSWA Phone Number: 07/30/2022, 10:33 AM  Clinical Narrative:     CSW refaxed out patient for SNF placement. CSW will continue to follow and assist with patients dc planning needs.  Update- CSW provided patients Granddaughter [name] with SNF bed offers. Patients Granddaughter would like to review and will give CSW call back tomorrow with SNF choice. CSW called Junious Dresser with [employer] and started insurance auth for SNF and PTAR. CSW will add facility choice once decided by patients Granddaughter.CSW will continue to follow.   Expected Discharge Plan: Skilled Nursing Facility Barriers to Discharge: Continued Medical Work up  Expected Discharge Plan and Services   Discharge Planning Services: CM Consult Post Acute Care Choice: Long Term Acute Care (LTAC)                                         Social Determinants of Health (SDOH) Interventions SDOH Screenings   Food Insecurity: No Food Insecurity (07/28/2022)  Housing: Low Risk  (07/28/2022)  Transportation Needs: No Transportation Needs (07/28/2022)  Utilities: Not At Risk (07/28/2022)  Tobacco Use: High Risk (07/28/2022)    Readmission Risk Interventions    07/22/2022    1:43 PM  Readmission Risk Prevention Plan  Transportation Screening Complete  Palliative Care Screening Complete

## 2022-07-31 DIAGNOSIS — J9602 Acute respiratory failure with hypercapnia: Secondary | ICD-10-CM | POA: Diagnosis not present

## 2022-07-31 DIAGNOSIS — J9601 Acute respiratory failure with hypoxia: Secondary | ICD-10-CM | POA: Diagnosis not present

## 2022-07-31 MED ORDER — DOXYCYCLINE HYCLATE 100 MG PO TABS
100.0000 mg | ORAL_TABLET | Freq: Two times a day (BID) | ORAL | Status: DC
  Administered 2022-07-31 – 2022-08-04 (×9): 100 mg via ORAL
  Filled 2022-07-31 (×10): qty 1

## 2022-07-31 MED ORDER — HALOPERIDOL LACTATE 5 MG/ML IJ SOLN
1.0000 mg | Freq: Four times a day (QID) | INTRAMUSCULAR | Status: DC | PRN
  Administered 2022-07-31: 1 mg via INTRAMUSCULAR
  Administered 2022-08-01 – 2022-08-03 (×3): 2 mg via INTRAMUSCULAR
  Filled 2022-07-31 (×4): qty 1

## 2022-07-31 MED ORDER — HALOPERIDOL LACTATE 5 MG/ML IJ SOLN: 1.0000 mg | Freq: Four times a day (QID) | INTRAMUSCULAR | Status: DC | PRN

## 2022-07-31 NOTE — TOC Progression Note (Addendum)
Transition of Care [facility]) - Progression Note    Patient Details  Name: Carla [name] MRN: [id number] Date of Birth: 26-Jul-1945  Transition of Care [facility]) CM/SW Contact  Delilah Shan, LCSWA Phone Number: 07/31/2022, 11:21 AM  Clinical Narrative:     Update-4:23pm- CSW received call from [place] with [employer] PTAR was approved for patient. Insurance authorization approval is good for 90 days. Reference # [id number]. SNF insurance authorization is still under med review. CSW will continue to follow.  Update-1:25pm- CSW spoke with patients Granddaughter [name] who accepted SNF bed offer with [facility]. CSW spoke with Eunice Blase at [facility] who confirmed they will have SNF bed for patient tomorrow if medically ready. CSW spoke with Junious Dresser with [employer] and added facility choice to patients insurance auth. Patients insurance authorization currently pending for SNF and PTAR. CSW LVM for patients Granddaughter [name]. CSW awaiting call back to discuss Granddaughters SNF choice for patient. CSW will continue to follow and assist with patients dc planning needs.  Expected Discharge Plan: Skilled Nursing Facility Barriers to Discharge: Continued Medical Work up  Expected Discharge Plan and Services   Discharge Planning Services: CM Consult Post Acute Care Choice: Long Term Acute Care (LTAC)                                         Social Determinants of Health (SDOH) Interventions SDOH Screenings   Food Insecurity: No Food Insecurity (07/28/2022)  Housing: Low Risk  (07/28/2022)  Transportation Needs: No Transportation Needs (07/28/2022)  Utilities: Not At Risk (07/28/2022)  Tobacco Use: High Risk (07/28/2022)    Readmission Risk Interventions    07/22/2022    1:43 PM  Readmission Risk Prevention Plan  Transportation Screening Complete  Palliative Care Screening Complete

## 2022-07-31 NOTE — TOC Progression Note (Incomplete Revision)
Transition of Care [facility]) - Progression Note    Patient Details  Name: Carla [name] MRN: [id number] Date of Birth: 26-Jul-1945  Transition of Care [facility]) CM/SW Contact  Delilah Shan, LCSWA Phone Number: 07/31/2022, 11:21 AM  Clinical Narrative:     Update-4:23pm- CSW received call from [place] with [employer] PTAR was approved for patient. Insurance authorization approval is good for 90 days. Reference # [id number]. SNF insurance authorization is still under med review. CSW will continue to follow.  Update-1:25pm- CSW spoke with patients Granddaughter [name] who accepted SNF bed offer with [facility]. CSW spoke with Eunice Blase at [facility] who confirmed they will have SNF bed for patient tomorrow if medically ready. CSW spoke with Junious Dresser with [employer] and added facility choice to patients insurance auth. Patients insurance authorization currently pending for SNF and PTAR. CSW LVM for patients Granddaughter [name]. CSW awaiting call back to discuss Granddaughters SNF choice for patient. CSW will continue to follow and assist with patients dc planning needs.  Expected Discharge Plan: Skilled Nursing Facility Barriers to Discharge: Continued Medical Work up  Expected Discharge Plan and Services   Discharge Planning Services: CM Consult Post Acute Care Choice: Long Term Acute Care (LTAC)                                         Social Determinants of Health (SDOH) Interventions SDOH Screenings   Food Insecurity: No Food Insecurity (07/28/2022)  Housing: Low Risk  (07/28/2022)  Transportation Needs: No Transportation Needs (07/28/2022)  Utilities: Not At Risk (07/28/2022)  Tobacco Use: High Risk (07/28/2022)    Readmission Risk Interventions    07/22/2022    1:43 PM  Readmission Risk Prevention Plan  Transportation Screening Complete  Palliative Care Screening Complete

## 2022-07-31 NOTE — Progress Notes (Addendum)
PROGRESS NOTE Carla [name]  ZOX:[id number] DOB: [birth year] DOA: 06/12/2022 PCP: Patient, [name]  Brief Narrative/Hospital Course: 12 yof w/ COPD/emphysema tobacco use,Hypertension, Dementia, Anxiety, Depression, GERD, Primary hyperparathyroidism, Vit D deficiency, HLD, Osteoporosis , found by family unresponsive and brought to ER on 06/12/22 and found to have acute on chronic hypercapnia on ABG>Intubated in ER and treated for COPD exacerbation. She had prolonged complicated hospitalization charted as below with multiple ICU admissions, needing tracheostomy placement, having encephalopathy with baseline dementia and pulling out her PICC line tracheostomy.   Significant Events  3/20 Admitted after being found unresponsive, intubated on ED arrival  CT angio chest >> atherosclerosis, severe coronary calcification, severe LVH, mild centrilobular emphysema, 2 mm nodule LUL, 4 mm nodule LUL Blood culture  >> Haemophilus influenzae 3/22 Had oxygen desaturation and low Vt with SBT this morning. Echo 06/14/22 >> EF 60 to 65%, mod LVH, grade 1 DD, mild/mod AR 3/25 bronch for peak pressures, low tidal volumes with mucus cast at end of ETT, thick secretions caking tube 3/29 Bronched again with purulent secretions LLL with plugging and LLL atelectasis, some increase in O2 requirement on vent, zosyn started for VAP coverage. MRSA nare swab today negative.  3/30 Remains intubated with waxing and weaning agitation on fentanyl and precedex  3/31 Remains on vent with continued issues with agitation when sedation lightened  4/2 family meeting, made DNR, plans for one way extubation 4/5 4/5-no overnight events, plan is for one-way extubation today 4/6-family changes mind regarding one-way extubation, plan will be for tracheostomy 4/8 trach placed 4/10 failed overnight weaning trial, back on PRVC and sedation  was added. Subsequently sedation weaned and enteral agents were dc and or decreased.  4/11 failed psv  w some distress. Fever. Abx added 4/12 cont efforts at psv. Most awake she has been in days   4/13 Tolerating PS.  4/15 Has been on ATC x 1:45 minutes, RR 40 and accessory muscle use, placing back on vent 4/18 Cortrak clogged and thus replaced. Still working on weaning completed CTx for PNA. 48 hrs on ATC  4/19 off vent x 72 hours so removed from room.  Planning on changing trach to cuffless.  Continuing to work with PMV.  Hypoglycemia adding D5 to maintenance fluids 4/20 transfer out of ICU  4/21 transfer to [facility].  4/26: Patient pulled tracheostomy tube.  4/28: Patient pulled PICC line, PIV inserted, Soft restraints. 4/30 : PCCM signed off-she is doing well without the tracheostomy     Subjective: Seen and examined this morning she is alert awake oriented to self, she thinks she is in her home.  No new complaints.   Remains confused at baseline Assessment and Plan: Principal Problem:   Acute respiratory failure with hypoxia and hypercapnia (HCC) Active Problems:   COPD with exacerbation (HCC)   Hyperglycemia   Acute metabolic encephalopathy   HTN (hypertension)   Tobacco abuse   Anxiety   Peripheral vascular disease (HCC)   Pulmonary nodules   Dementia (HCC)   Protein-calorie malnutrition, severe (HCC)   Urinary retention   Hypervolemia   Encephalopathy acute   Tracheostomy in place [facility])   AKI (acute kidney injury) (HCC)   Hypernatremia   Fever   Acute respiratory failure with hypercapnia (HCC)   Hyponatremia   Pressure injury of skin  Acute hypoxic/hypercapnic respiratory failure: Complicated hospital course w/ intubation, tracheostomy placement>she pulled out trach on 4/26, fortunately doing well without tracheostomy, site is healed.  Currently on room air Continue  [name] Pulmicort  Nebs, and bronchodilator PRN.  Acute metabolic encephalopathy in the background of dementia Dysphagia: Presented with unresponsiveness s/p extensive workup including  head CT  negative for acute abnormality. Felt to be due to respiratory failure and also with ICU delirium.  Initially on core track>now tolerating p.o. seen by speech currently on DYS 3 diet.   Off IVF.she is alert awake eating with assistance, moving all extremities BUT is confused with dementia and high risk for fall.Continue clonazepam, Seroquel qhs. Discontinued Atarax-try to minimize sedative antihistaminic.  1. Avoid/Minimize benzodiazepines, antihistamines, anticholinergics, and minimize opiate if possible 2: Assess, prevent and manage pain as lack of treatment can result in delirium.  3: Provide appropriate lighting and clear signage; a clock and calendar should be easily visible to the patient. 4: Monitor environmental factors. Reduce light and noise at night (close shades, turn off lights, turn off TV, ect). Correct any alterations in sleep cycle. 5: Reorient the patient to person, place, time and situation on each encounter.  6: Correct sensory deficits if possible (replace eye glasses, hearing aids, ect). 7: Avoid restraints if able. Severely delirious patients benefit from constant observation by a sitter   Found on the floor on 5/2 small bruise on the back of the head:?Hair got caught versus fall. CT head 5/2 no acute finding and no focal on exam no issues. Placed on one to one sitter since then> monitor and see if she can be removed off sitter since patient needs 48 hours of seizure-free to be able to qualify for skilled nursing placement.  Add doxycycline for a small bump on her back of her head   Type 2 diabetes mellitus blood sugar is controlled on SSILast Hb A1c is 6.0 last month.   Acute kidney injury:Resolved.    Pulmonary nodules:Incidental finding on CT 06/12/2022 - 2 mm and 4 mm noncalcified left upper lobe lung nodules.  She will need follow-up with CT scan in 12 months   Essential hypertension Bp stabe cont  Norvasc and Coreg  Chronic diastolic CHF currently euvolemic Net IO Since  Admission: 7,058.16 mL [07/31/22 1117]   Hypokalemia resolved   Severe malnutrition augment diet as below RD following Nutrition Problem: Severe Malnutrition Etiology: chronic illness (COPD, dementia) Signs/Symptoms: severe fat depletion, severe muscle depletion Interventions: Ensure Enlive (each supplement provides 350kcal and 20 grams of protein), Juven  DVT prophylaxis: enoxaparin (LOVENOX) injection 30 mg Start: 07/02/22 1000 Code Status:   Code Status: DNR Family Communication: plan of care discussed with patient/no family  at bedside. Patient status is:  inpatient  because of awaiting SNF Patient's granddaughter was able to be reached and has been updated by staffs.  Level of care: Progressive  Dispo:Anticipated disposition: Awaiting on SNF bed offers.  Discussed with TOC this morning   Objective: Vitals last 24 hrs: Vitals:   07/30/22 1928 07/30/22 2041 07/30/22 2230 07/31/22 0512  BP: 136/66   129/64  Pulse: 86  99 76  Resp: (!) 21  20 (!) 25  Temp: 97.7 F (36.5 C)   (!) 97 F (36.1 C)  TempSrc: Oral   Oral  SpO2: 96% 96% (!) 88% (!) 89%  Weight:      Height:       Weight change:   Physical Examination: General exam: alert awake oriented x 1-2 elderly and frail HEENT:Oral mucosa moist, Ear/Nose WNL grossly Respiratory system: Bilaterally clear BS, no use of accessory muscle Cardiovascular system: S1 & S2 +, No JVD. Gastrointestinal system: Abdomen  soft,NT,ND, BS+ Nervous System: Alert, awake, moving all extremities, shefollows commands. Extremities: LE edema neg,distal peripheral pulses palpable.  Skin: No rashes,no icterus. MSK: Normal muscle bulk,tone, power   Medications reviewed:  Scheduled Meds:  amLODipine  5 mg Oral Daily   arformoterol  15 mcg Nebulization BID   budesonide (PULMICORT) nebulizer solution  0.5 mg Nebulization BID   carvedilol  3.125 mg Oral BID WC   clonazePAM  0.5 mg Oral Daily   clonazePAM  0.5 mg Oral QHS   doxycycline  100 mg  Oral Q12H   enoxaparin (LOVENOX) injection  30 mg Subcutaneous Daily   feeding supplement  237 mL Oral BID BM   mupirocin cream   Topical BID   nutrition supplement (JUVEN)  1 packet Oral BID BM   mouth rinse  15 mL Mouth Rinse 4 times per day   pantoprazole  40 mg Oral Daily   QUEtiapine  50 mg Oral QHS   revefenacin  175 mcg Nebulization Daily  Continuous Infusions:  sodium chloride 10 mL/hr at 07/01/22 0503    Diet Order             DIET DYS 3 Room service appropriate? Yes with Assist; Fluid consistency: Thin  Diet effective now                 Nutrition Problem: Severe Malnutrition Etiology: chronic illness (COPD, dementia) Signs/Symptoms: severe fat depletion, severe muscle depletion Interventions: Ensure Enlive (each supplement provides 350kcal and 20 grams of protein), Juven  No intake or output data in the 24 hours ending 07/31/22 1117 Net IO Since Admission: 7,058.16 mL [07/31/22 1117]  Wt Readings from Last 3 Encounters:  07/30/22 38.6 kg     Unresulted Labs (From admission, onward)     Start     Ordered   06/12/22 1810  Miscellaneous test (send-out)  Once,   R        06/12/22 1810          Data Reviewed: I have personally reviewed following labs and imaging studies CBC: Recent Labs  Lab 07/28/22 0148  WBC 8.7  HGB 13.5  HCT 41.0  MCV 94.5  PLT 267    Basic Metabolic Panel: Recent Labs  Lab 07/28/22 0148  NA 135  K 3.8  CL 104  CO2 25  GLUCOSE 109*  BUN 37*  CREATININE 0.80  CALCIUM 9.8   No results found for this or any previous visit (from the past 240 hour(s)).  Antimicrobials: Anti-infectives (From admission, onward)    Start     Dose/Rate Route Frequency Ordered Stop   07/31/22 1030  doxycycline (VIBRA-TABS) tablet 100 mg        100 mg Oral Every 12 hours 07/31/22 0932 08/05/22 0959   07/06/22 1000  cefTRIAXone (ROCEPHIN) 2 g in sodium chloride 0.9 % 100 mL IVPB        2 g 200 mL/hr over 30 Minutes Intravenous Every 24 hours  07/06/22 0848 07/08/22 0952   07/05/22 0200  piperacillin-tazobactam (ZOSYN) IVPB 3.375 g  Status:  Discontinued        3.375 g 12.5 mL/hr over 240 Minutes Intravenous Every 8 hours 07/04/22 1826 07/06/22 0848   07/04/22 1915  vancomycin (VANCOCIN) IVPB 1000 mg/200 mL premix  Status:  Discontinued        1,000 mg 200 mL/hr over 60 Minutes Intravenous Every 48 hours 07/04/22 1826 07/05/22 0827   07/04/22 1915  piperacillin-tazobactam (ZOSYN) IVPB 3.375 g  3.375 g 100 mL/hr over 30 Minutes Intravenous  Once 07/04/22 1826 07/04/22 1914   06/21/22 2300  piperacillin-tazobactam (ZOSYN) IVPB 3.375 g        3.375 g 12.5 mL/hr over 240 Minutes Intravenous Every 8 hours 06/21/22 1631 06/29/22 2009   06/21/22 1645  piperacillin-tazobactam (ZOSYN) IVPB 3.375 g  Status:  Discontinued        3.375 g 12.5 mL/hr over 240 Minutes Intravenous Every 8 hours 06/21/22 1630 06/21/22 1631   06/21/22 1645  piperacillin-tazobactam (ZOSYN) IVPB 3.375 g        3.375 g 100 mL/hr over 30 Minutes Intravenous STAT 06/21/22 1631 06/21/22 1900   06/14/22 1315  cefTRIAXone (ROCEPHIN) 2 g in sodium chloride 0.9 % 100 mL IVPB  Status:  Discontinued        2 g 200 mL/hr over 30 Minutes Intravenous Every 24 hours 06/14/22 1249 06/20/22 1415   06/12/22 2230  doxycycline (VIBRAMYCIN) 100 mg in sodium chloride 0.9 % 250 mL IVPB  Status:  Discontinued        100 mg 125 mL/hr over 120 Minutes Intravenous Every 12 hours 06/12/22 2139 06/14/22 1350      Culture/Microbiology    Component Value Date/Time   SDES BLOOD RIGHT ARM 07/04/2022 1812   SPECREQUEST  07/04/2022 1812    BOTTLES DRAWN AEROBIC ONLY Blood Culture adequate volume   CULT  07/04/2022 1812    NO GROWTH 5 DAYS Performed at [facility], [address], [place], [place] [address]    REPTSTATUS 07/09/2022 FINAL 07/04/2022 1812  Radiology Studies: No results found.   LOS: 49 days   Lanae Boast, MD [facility]  07/31/2022, 11:17 AM

## 2022-08-01 NOTE — Progress Notes (Addendum)
Physical Therapy Treatment Patient Details Name:  C  MRN:  DOB:  Today's Date: 08/01/2022   History of Present Illness 77 yo female admitted 3/20 unresponsive from home with acute respiratory failure with hypoxia and hypercapnia and metabolic encephalopathy. Intubated 3/20. trach 4/8. PMhx: emphysema, tobacco abuse, PVD, HTN, prediabetes, anxiety, dementia    PT Comments    Pt continues to make steady progress with mobility. Patient will benefit from continued post acute therapy, <3 hours/day    Recommendations for follow up therapy are one component of a multi-disciplinary discharge planning process, led by the attending physician.  Recommendations may be updated based on patient status, additional functional criteria and insurance authorization.  Follow Up Recommendations  Can patient physically be transported by private vehicle: No    Assistance Recommended at Discharge Frequent or constant Supervision/Assistance  Patient can return home with the following Two people to help with walking and/or transfers;Two people to help with bathing/dressing/bathroom;Direct supervision/assist for medications management;Assistance with cooking/housework;Assist for transportation   Equipment Recommendations  Hospital bed;Wheelchair (measurements PT);Wheelchair cushion (measurements PT)    Recommendations for Other Services       Precautions / Restrictions Precautions Precautions: Fall Precaution Comments: trach collar     Mobility  Bed Mobility Overal bed mobility: Needs Assistance Bed Mobility: Supine to Sit, Sit to Supine     Supine to sit: HOB elevated, Min assist Sit to supine: Min guard   General bed mobility comments: Assist to initiate elevating trunk into sitting    Transfers Overall transfer level: Needs assistance Equipment used: Rolling walker (2 wheels) Transfers: Sit to/from Stand Sit to Stand: Min assist           General  transfer comment: Assist to power up and for balance    Ambulation/Gait Ambulation/Gait assistance: Min assist Gait Distance (Feet): 25 Feet Assistive device: Rolling walker (2 wheels) Gait Pattern/deviations: Step-through pattern, Decreased step length - right, Decreased step length - left, Shuffle, Narrow base of support Gait velocity: decr Gait velocity interpretation: <1.31 ft/sec, indicative of household ambulator   General Gait Details: Assist for balance and support   Stairs             Wheelchair Mobility    Modified Rankin (Stroke Patients Only)       Balance Overall balance assessment: Needs assistance Sitting-balance support: Feet supported Sitting balance-Leahy Scale: Fair     Standing balance support: Bilateral upper extremity supported Standing balance-Leahy Scale: Poor Standing balance comment: walker and min guard assist for static standing                            Cognition Arousal/Alertness: Awake/alert Behavior During Therapy: Impulsive Overall Cognitive Status: Impaired/Different from baseline Area of Impairment: Attention, Following commands, Problem solving, Orientation, Memory, Safety/judgement, Awareness                 Orientation Level: Disoriented to, Place, Time, Situation Current Attention Level: Sustained Memory: Decreased short-term memory, Decreased recall of precautions Following Commands: Follows one step commands inconsistently, Follows one step commands with increased time Safety/Judgement: Decreased awareness of safety, Decreased awareness of deficits Awareness: Intellectual Problem Solving: Requires verbal cues, Requires tactile cues, Difficulty sequencing, Decreased initiation, Slow processing General Comments: Following commands more consistently and not requiring multimodal cues        Exercises      General Comments        Pertinent Vitals/Pain Pain Assessment Pain Assessment: Faces Faces    Pain Scale: No hurt    Home Living                          Prior Function            PT Goals (current goals can now be found in the care plan section) Acute Rehab PT Goals Patient Stated Goal: not stated Progress towards PT goals: Progressing toward goals    Frequency    Min 1X/week      PT Plan Current plan remains appropriate    Co-evaluation              AM-PAC PT "6 Clicks" Mobility   Outcome Measure  Help needed turning from your back to your side while in a flat bed without using bedrails?: A Little Help needed moving from lying on your back to sitting on the side of a flat bed without using bedrails?: A Little Help needed moving to and from a bed to a chair (including a wheelchair)?: A Little Help needed standing up from a chair using your arms (e.g., wheelchair or bedside chair)?: A Little Help needed to walk in hospital room?: A Little Help needed climbing 3-5 steps with a railing? : Total 6 Click Score: 16    End of Session Equipment Utilized During Treatment: Gait belt Activity Tolerance: Patient tolerated treatment well Patient left: in bed;with call bell/phone within reach;with bed alarm set;Other (comment) (Waist alarm on)   PT Visit Diagnosis: Other abnormalities of gait and mobility (R26.89);Muscle weakness (generalized) (M62.81);Difficulty in walking, not elsewhere classified (R26.2)     Time: 1015-1031 PT Time Calculation (min) (ACUTE ONLY): 16 min  Charges:  $Gait Training: 8-22 mins                     Ashland Osmer PT Acute Rehabilitation Services Office [phone]    Lonzie Simmer W [place] 08/01/2022, 11:57 AM   

## 2022-08-01 NOTE — TOC Progression Note (Incomplete Revision)
Transition of Care [facility]) - Progression Note    Patient Details  Name: Carla [name] MRN: [id number] Date of Birth: 08-24-1945  Transition of Care [facility]) CM/SW Contact  Delilah Shan, LCSWA Phone Number: 08/01/2022, 1:24 PM  Clinical Narrative:     CSW received consult from MD for possible Memory Care placement for patient. CSW discussed with patients Granddaughter [name]. [name] in agreement for CSW to fax patient out for Memory care placement. Patients Granddaughter understands with placement at memory care there will be out of pocket cost without patient having medicaid. CSW will follow up with patients Granddaughter with bed offers when available. Junious Dresser with [facility] informed CSW patients insurance auth went to a peer to peer. CSW informed Junious Dresser with [facility] MD informed CSW will not be completing peer to peer. CSW requested to cancel insurance auth for SNF. CSW informed Debbie with SNF.CSW will continue to follow and assist with patients dc planning needs  CSW spoke with patients Granddaughter who request for CSW to reach out to financial counseling to see if they can screen patient for medicaid. CSW emailed Christia Reading with financial counseling and requested patient be screened for medicaid. CSW will continue to follow.  Update-4:56pm- Patients Granddaughter called CSW and after speaking with her family, she reports they will not be able to pay out of pocket for memory care for patient. Patients Granddaughter reports she still wants to see what options are with memory care in hopes patient will be eligable for medicaid, but will not be able to pay out of pocket after speaking with family. CSW informed patients Granddaughter CSW reached out to financial counseling to request patient be screened for medicaid.Patients Granddaughter request MD to give her a call to assist with medical questions she has. CSW informed MD. CSW will follow up with patients Granddaughter tomorrow on dc plan.  Expected  Discharge Plan: Skilled Nursing Facility Barriers to Discharge: Continued Medical Work up  Expected Discharge Plan and Services   Discharge Planning Services: CM Consult Post Acute Care Choice: Long Term Acute Care (LTAC)                                         Social Determinants of Health (SDOH) Interventions SDOH Screenings   Food Insecurity: No Food Insecurity (07/28/2022)  Housing: Low Risk  (07/28/2022)  Transportation Needs: No Transportation Needs (07/28/2022)  Utilities: Not At Risk (07/28/2022)  Tobacco Use: High Risk (07/28/2022)    Readmission Risk Interventions    07/22/2022    1:43 PM  Readmission Risk Prevention Plan  Transportation Screening Complete  Palliative Care Screening Complete

## 2022-08-01 NOTE — Progress Notes (Addendum)
PROGRESS NOTE Carla [name]  ZOX:[id number] DOB: Sep 29, 1945 DOA: 06/12/2022 PCP: Patient, [name]  Brief Narrative/Hospital Course: 69 yof w/ COPD/emphysema tobacco use,Hypertension, Dementia, Anxiety, Depression, GERD, Primary hyperparathyroidism, Vit D deficiency, HLD, Osteoporosis , found by family unresponsive and brought to ER on 06/12/22 and found to have acute on chronic hypercapnia on ABG>Intubated in ER and treated for COPD exacerbation. She had prolonged complicated hospitalization charted as below with multiple ICU admissions, needing tracheostomy placement, having encephalopathy with baseline dementia and pulling out her PICC line tracheostomy.   Significant Events  3/20 Admitted after being found unresponsive, intubated on ED arrival  CT angio chest >> atherosclerosis, severe coronary calcification, severe LVH, mild centrilobular emphysema, 2 mm nodule LUL, 4 mm nodule LUL Blood culture  >> Haemophilus influenzae 3/22 Had oxygen desaturation and low Vt with SBT this morning. Echo 06/14/22 >> EF 60 to 65%, mod LVH, grade 1 DD, mild/mod AR 3/25 bronch for peak pressures, low tidal volumes with mucus cast at end of ETT, thick secretions caking tube 3/29 Bronched again with purulent secretions LLL with plugging and LLL atelectasis, some increase in O2 requirement on vent, zosyn started for VAP coverage. MRSA nare swab today negative.  3/30 Remains intubated with waxing and weaning agitation on fentanyl and precedex  3/31 Remains on vent with continued issues with agitation when sedation lightened  4/2 family meeting, made DNR, plans for one way extubation 4/5 4/5-no overnight events, plan is for one-way extubation today 4/6-family changes mind regarding one-way extubation, plan will be for tracheostomy 4/8 trach placed 4/10 failed overnight weaning trial, back on PRVC and sedation  was added. Subsequently sedation weaned and enteral agents were dc and or decreased.  4/11 failed psv  w some distress. Fever. Abx added 4/12 cont efforts at psv. Most awake she has been in days   4/13 Tolerating PS.  4/15 Has been on ATC x 1:45 minutes, RR 40 and accessory muscle use, placing back on vent 4/18 Cortrak clogged and thus replaced. Still working on weaning completed CTx for PNA. 48 hrs on ATC  4/19 off vent x 72 hours so removed from room.  Planning on changing trach to cuffless.  Continuing to work with PMV.  Hypoglycemia adding D5 to maintenance fluids 4/20 transfer out of ICU  4/21 transfer to [facility].  4/26: Patient pulled tracheostomy tube.  4/28: Patient pulled PICC line, PIV inserted, Soft restraints. 4/30 : PCCM signed off-she is doing well without the tracheostomy    Subjective: Seen this am Alert awake able to tell me her name oriented to self Moving her extremities She fell again yesterday  Assessment and Plan: Principal Problem:   Acute respiratory failure with hypoxia and hypercapnia (HCC) Active Problems:   COPD with exacerbation (HCC)   Hyperglycemia   Acute metabolic encephalopathy   HTN (hypertension)   Tobacco abuse   Anxiety   Peripheral vascular disease (HCC)   Pulmonary nodules   Dementia (HCC)   Protein-calorie malnutrition, severe (HCC)   Urinary retention   Hypervolemia   Encephalopathy acute   Tracheostomy in place [name])   AKI (acute kidney injury) (HCC)   Hypernatremia   Fever   Acute respiratory failure with hypercapnia (HCC)   Hyponatremia   Pressure injury of skin  Acute hypoxic/hypercapnic respiratory failure: Complicated hospital course w/ intubation, tracheostomy placement>she pulled out trach on 4/26, fortunately doing well without tracheostomy, site is healed.  Currently on room air- resp status stable Cont Yupelri, Brovana Pulmicort  Nebs,  and bronchodilator PRN.  Acute metabolic encephalopathy in the background of dementia Dysphagia: Presented with unresponsiveness s/p extensive workup including  head CT negative for acute  abnormality. Felt to be due to respiratory failure and also with ICU delirium.  Initially on core track>now tolerating p.o. seen by speech currently on DYS 3 diet.   Off IVF.she is alert awake eating with assistance when fed>moving all extremities BUT is confused with dementia and high risk for fall.Continue clonazepam bid,Seroquel qhs. Discontinued Atarax-try to minimize sedative antihistaminic.  Needed haldol  5/8 pm. Will get psych to see her to optimize her meds to help with her behaviors.  1. Avoid/Minimize benzodiazepines, antihistamines, anticholinergics, and minimize opiate if possible 2: Assess, prevent and manage pain as lack of treatment can result in delirium.  3: Provide appropriate lighting and clear signage; a clock and calendar should be easily visible to the patient. 4: Monitor environmental factors. Reduce light and noise at night (close shades, turn off lights, turn off TV, ect). Correct any alterations in sleep cycle. 5: Reorient the patient to person, place, time and situation on each encounter.  6: Correct sensory deficits if possible (replace eye glasses, hearing aids, ect). 7: Avoid restraints if able. Severely delirious patients benefit from constant observation by a sitter   Found on the floor on 5/2  and 5/8: small bruise on the back of the head on 5/2- ct head negative. She was placed on one to one sitter but now off> but found on floor 5/8 pm- no injury noted. ? If she needs memory care if does not improve. Discussed w/ TOC- probably not safe for dispo without close supervision at this time.   Type 2 diabetes mellitus blood sugar is controlled on SSILast Hb A1c is 6.0 last month.   Acute kidney injury:Resolved.    Pulmonary nodules:Incidental finding on CT 06/12/2022 - 2 mm and 4 mm noncalcified left upper lobe lung nodules.  She will need follow-up with CT scan in 12 months   Essential hypertension Bp stabe cont  Norvasc and Coreg  Chronic diastolic CHF currently  euvolemic Net IO Since Admission: 6,758.16 mL [08/01/22 1308]   Hypokalemia resolved   Severe malnutrition augment diet as below RD following. When fed she is able to eat po well. RN charted upto 100% intake til 5/5- please chart her po intake. Nutrition Problem: Severe Malnutrition Etiology: chronic illness (COPD, dementia) Signs/Symptoms: severe fat depletion, severe muscle depletion Interventions: Ensure Enlive (each supplement provides 350kcal and 20 grams of protein), Juven  DVT prophylaxis: enoxaparin (LOVENOX) injection 30 mg Start: 07/02/22 1000 Code Status:   Code Status: DNR Family Communication: plan of care discussed with patient/no family  at bedside. Patient status is:  inpatient  because of awaiting SNF Patient's granddaughter was able to be reached and has been updated by staffs.  Level of care: Progressive  Dispo:Anticipated disposition: Awaiting on SNF vs memory care due to dementia and high risk for fall. bed offers.  Discussed with TOC this morning   Objective: Vitals last 24 hrs: Vitals:   07/31/22 1407 07/31/22 1537 07/31/22 2023 08/01/22 0342  BP: 129/71 125/66 136/80 (!) 141/76  Pulse: 93   80  Resp:  18 18 18   Temp: 97.7 F (36.5 C)  97.9 F (36.6 C) 97.8 F (36.6 C)  TempSrc: Oral  Oral Oral  SpO2:  95% 95% 95%  Weight:    37.4 kg  Height:       Weight change:   Physical Examination:  General exam: Aaox1-2, weak,older appearing HEENT:Oral mucosa moist, Ear/Nose WNL grossly, dentition normal. Respiratory system: bilaterally clear BS, trach site healed Cardiovascular system: S1 & S2 +, regular rate. Gastrointestinal system: Abdomen soft, NT,ND,BS+ Nervous System:Alert, awake, moving all extremities and grossly nonfocal Extremities: LE ankle edema neg, lower extremities warm Skin: No rashes,no icterus. MSK: Normal muscle bulk,tone, power    Medications reviewed:  Scheduled Meds:  amLODipine  5 mg Oral Daily   arformoterol  15 mcg Nebulization  BID   budesonide (PULMICORT) nebulizer solution  0.5 mg Nebulization BID   carvedilol  3.125 mg Oral BID WC   clonazePAM  0.5 mg Oral Daily   clonazePAM  0.5 mg Oral QHS   doxycycline  100 mg Oral Q12H   enoxaparin (LOVENOX) injection  30 mg Subcutaneous Daily   feeding supplement  237 mL Oral BID BM   mupirocin cream   Topical BID   nutrition supplement (JUVEN)  1 packet Oral BID BM   mouth rinse  15 mL Mouth Rinse 4 times per day   pantoprazole  40 mg Oral Daily   QUEtiapine  50 mg Oral QHS   revefenacin  175 mcg Nebulization Daily  Continuous Infusions:  sodium chloride 10 mL/hr at 07/01/22 0503    Diet Order             DIET DYS 3 Room service appropriate? Yes with Assist; Fluid consistency: Thin  Diet effective now                 Nutrition Problem: Severe Malnutrition Etiology: chronic illness (COPD, dementia) Signs/Symptoms: severe fat depletion, severe muscle depletion Interventions: Ensure Enlive (each supplement provides 350kcal and 20 grams of protein), Juven   Intake/Output Summary (Last 24 hours) at 08/01/2022 1308 Last data filed at 08/01/2022 0344 Gross per 24 hour  Intake --  Output 300 ml  Net -300 ml   Net IO Since Admission: 6,758.16 mL [08/01/22 1308]  Wt Readings from Last 3 Encounters:  08/01/22 37.4 kg     Unresulted Labs (From admission, onward)     Start     Ordered   06/12/22 1810  Miscellaneous test (send-out)  Once,   R        06/12/22 1810          Data Reviewed: I have personally reviewed following labs and imaging studies CBC: Recent Labs  Lab 07/28/22 0148  WBC 8.7  HGB 13.5  HCT 41.0  MCV 94.5  PLT 267    Basic Metabolic Panel: Recent Labs  Lab 07/28/22 0148  NA 135  K 3.8  CL 104  CO2 25  GLUCOSE 109*  BUN 37*  CREATININE 0.80  CALCIUM 9.8   No results found for this or any previous visit (from the past 240 hour(s)).  Antimicrobials: Anti-infectives (From admission, onward)    Start     Dose/Rate Route  Frequency Ordered Stop   07/31/22 1030  doxycycline (VIBRA-TABS) tablet 100 mg        100 mg Oral Every 12 hours 07/31/22 0932 08/05/22 0959   07/06/22 1000  cefTRIAXone (ROCEPHIN) 2 g in sodium chloride 0.9 % 100 mL IVPB        2 g 200 mL/hr over 30 Minutes Intravenous Every 24 hours 07/06/22 0848 07/08/22 0952   07/05/22 0200  piperacillin-tazobactam (ZOSYN) IVPB 3.375 g  Status:  Discontinued        3.375 g 12.5 mL/hr over 240 Minutes Intravenous Every 8 hours 07/04/22 1826  07/06/22 0848   07/04/22 1915  vancomycin (VANCOCIN) IVPB 1000 mg/200 mL premix  Status:  Discontinued        1,000 mg 200 mL/hr over 60 Minutes Intravenous Every 48 hours 07/04/22 1826 07/05/22 0827   07/04/22 1915  piperacillin-tazobactam (ZOSYN) IVPB 3.375 g        3.375 g 100 mL/hr over 30 Minutes Intravenous  Once 07/04/22 1826 07/04/22 1914   06/21/22 2300  piperacillin-tazobactam (ZOSYN) IVPB 3.375 g        3.375 g 12.5 mL/hr over 240 Minutes Intravenous Every 8 hours 06/21/22 1631 06/29/22 2009   06/21/22 1645  piperacillin-tazobactam (ZOSYN) IVPB 3.375 g  Status:  Discontinued        3.375 g 12.5 mL/hr over 240 Minutes Intravenous Every 8 hours 06/21/22 1630 06/21/22 1631   06/21/22 1645  piperacillin-tazobactam (ZOSYN) IVPB 3.375 g        3.375 g 100 mL/hr over 30 Minutes Intravenous STAT 06/21/22 1631 06/21/22 1900   06/14/22 1315  cefTRIAXone (ROCEPHIN) 2 g in sodium chloride 0.9 % 100 mL IVPB  Status:  Discontinued        2 g 200 mL/hr over 30 Minutes Intravenous Every 24 hours 06/14/22 1249 06/20/22 1415   06/12/22 2230  doxycycline (VIBRAMYCIN) 100 mg in sodium chloride 0.9 % 250 mL IVPB  Status:  Discontinued        100 mg 125 mL/hr over 120 Minutes Intravenous Every 12 hours 06/12/22 2139 06/14/22 1350      Culture/Microbiology    Component Value Date/Time   SDES BLOOD RIGHT ARM 07/04/2022 1812   SPECREQUEST  07/04/2022 1812    BOTTLES DRAWN AEROBIC ONLY Blood Culture adequate volume    CULT  07/04/2022 1812    NO GROWTH 5 DAYS Performed at [facility], [address], [place], [place] [address]    REPTSTATUS 07/09/2022 FINAL 07/04/2022 1812  Radiology Studies: No results found.   LOS: 50 days   Lanae Boast, MD [facility]  08/01/2022, 1:08 PM

## 2022-08-01 NOTE — TOC Progression Note (Addendum)
Transition of Care [facility]) - Progression Note    Patient Details  Name: Carla [name] MRN: [id number] Date of Birth: 08-24-1945  Transition of Care [facility]) CM/SW Contact  Delilah Shan, LCSWA Phone Number: 08/01/2022, 1:24 PM  Clinical Narrative:     CSW received consult from MD for possible Memory Care placement for patient. CSW discussed with patients Granddaughter [name]. [name] in agreement for CSW to fax patient out for Memory care placement. Patients Granddaughter understands with placement at memory care there will be out of pocket cost without patient having medicaid. CSW will follow up with patients Granddaughter with bed offers when available. Junious Dresser with [facility] informed CSW patients insurance auth went to a peer to peer. CSW informed Junious Dresser with [facility] MD informed CSW will not be completing peer to peer. CSW requested to cancel insurance auth for SNF. CSW informed Debbie with SNF.CSW will continue to follow and assist with patients dc planning needs  CSW spoke with patients Granddaughter who request for CSW to reach out to financial counseling to see if they can screen patient for medicaid. CSW emailed Christia Reading with financial counseling and requested patient be screened for medicaid. CSW will continue to follow.  Update-4:56pm- Patients Granddaughter called CSW and after speaking with her family, she reports they will not be able to pay out of pocket for memory care for patient. Patients Granddaughter reports she still wants to see what options are with memory care in hopes patient will be eligable for medicaid, but will not be able to pay out of pocket after speaking with family. CSW informed patients Granddaughter CSW reached out to financial counseling to request patient be screened for medicaid.Patients Granddaughter request MD to give her a call to assist with medical questions she has. CSW informed MD. CSW will follow up with patients Granddaughter tomorrow on dc plan.  Expected  Discharge Plan: Skilled Nursing Facility Barriers to Discharge: Continued Medical Work up  Expected Discharge Plan and Services   Discharge Planning Services: CM Consult Post Acute Care Choice: Long Term Acute Care (LTAC)                                         Social Determinants of Health (SDOH) Interventions SDOH Screenings   Food Insecurity: No Food Insecurity (07/28/2022)  Housing: Low Risk  (07/28/2022)  Transportation Needs: No Transportation Needs (07/28/2022)  Utilities: Not At Risk (07/28/2022)  Tobacco Use: High Risk (07/28/2022)    Readmission Risk Interventions    07/22/2022    1:43 PM  Readmission Risk Prevention Plan  Transportation Screening Complete  Palliative Care Screening Complete

## 2022-08-01 NOTE — Care Management Important Message (Addendum)
Important Message  Patient Details  Name: Carla [name] MRN: [id number] Date of Birth: [birth year]   Medicare Important Message Given:  Yes     Renie Ora 08/01/2022, 11:31 AM

## 2022-08-02 DIAGNOSIS — F03918 Unspecified dementia, unspecified severity, with other behavioral disturbance: Secondary | ICD-10-CM

## 2022-08-02 MED ORDER — QUETIAPINE FUMARATE 25 MG PO TABS
12.5000 mg | ORAL_TABLET | Freq: Two times a day (BID) | ORAL | Status: DC
  Administered 2022-08-02 – 2022-08-03 (×3): 12.5 mg via ORAL
  Filled 2022-08-02 (×3): qty 1

## 2022-08-02 MED ORDER — NICOTINE 21 MG/24HR TD PT24
21.0000 mg | MEDICATED_PATCH | Freq: Every day | TRANSDERMAL | Status: DC
  Administered 2022-08-03 – 2022-08-29 (×25): 21 mg via TRANSDERMAL
  Filled 2022-08-02 (×29): qty 1

## 2022-08-02 NOTE — Progress Notes (Addendum)
PROGRESS NOTE Carla [name]  ZOX:[id number] DOB: [birth year] DOA: 06/12/2022 PCP: Patient, [name]  Brief Narrative/Hospital Course: 48 yof w/ COPD/emphysema tobacco use,Hypertension, Dementia, Anxiety, Depression, GERD, Primary hyperparathyroidism, Vit D deficiency, HLD, Osteoporosis , found by family unresponsive and brought to ER on 06/12/22 and found to have acute on chronic hypercapnia on ABG>Intubated in ER and treated for COPD exacerbation. She had prolonged complicated hospitalization charted as below with multiple ICU admissions, needing tracheostomy placement, having encephalopathy with baseline dementia and pulling out her PICC line tracheostomy.   Significant Events  3/20 Admitted after being found unresponsive, intubated on ED arrival  CT angio chest >> atherosclerosis, severe coronary calcification, severe LVH, mild centrilobular emphysema, 2 mm nodule LUL, 4 mm nodule LUL Blood culture  >> Haemophilus influenzae 3/22 Had oxygen desaturation and low Vt with SBT this morning. Echo 06/14/22 >> EF 60 to 65%, mod LVH, grade 1 DD, mild/mod AR 3/25 bronch for peak pressures, low tidal volumes with mucus cast at end of ETT, thick secretions caking tube 3/29 Bronched again with purulent secretions LLL with plugging and LLL atelectasis, some increase in O2 requirement on vent, zosyn started for VAP coverage. MRSA nare swab today negative.  3/30 Remains intubated with waxing and weaning agitation on fentanyl and precedex  3/31 Remains on vent with continued issues with agitation when sedation lightened  4/2 family meeting, made DNR, plans for one way extubation 4/5 4/5-no overnight events, plan is for one-way extubation today 4/6-family changes mind regarding one-way extubation, plan will be for tracheostomy 4/8 trach placed 4/10 failed overnight weaning trial, back on PRVC and sedation  was added. Subsequently sedation weaned and enteral agents were dc and or decreased.  4/11 failed psv  w some distress. Fever. Abx added 4/12 cont efforts at psv. Most awake she has been in days   4/13 Tolerating PS.  4/15 Has been on ATC x 1:45 minutes, RR 40 and accessory muscle use, placing back on vent 4/18 Cortrak clogged and thus replaced. Still working on weaning completed CTx for PNA. 48 hrs on ATC  4/19 off vent x 72 hours so removed from room.  Planning on changing trach to cuffless.  Continuing to work with PMV.  Hypoglycemia adding D5 to maintenance fluids 4/20 transfer out of ICU  4/21 transfer to [facility].  4/26: Patient pulled tracheostomy tube.  4/28: Patient pulled PICC line, PIV inserted, Soft restraints. 4/30 : PCCM signed off-she is doing well without the tracheostomy    Subjective:  Seen and examined this morning appears more alert awake communicative able to tell me her name date of birth and that she is in [facility] (previously she used to thinks she is at home), thinks reasonable is the president.  Sitter has been placed last night due to agitation  Assessment and Plan: Principal Problem:   Acute respiratory failure with hypoxia and hypercapnia (HCC) Active Problems:   COPD with exacerbation (HCC)   Hyperglycemia   Acute metabolic encephalopathy   HTN (hypertension)   Tobacco abuse   Anxiety   Peripheral vascular disease (HCC)   Pulmonary nodules   Dementia (HCC)   Protein-calorie malnutrition, severe (HCC)   Urinary retention   Hypervolemia   Encephalopathy acute   Tracheostomy in place (HCC)   AKI (acute kidney injury) (HCC)   Hypernatremia   Fever   Acute respiratory failure with hypercapnia (HCC)   Hyponatremia   Pressure injury of skin  Acute hypoxic/hypercapnic respiratory failure: Complicated hospital course w/  intubation, tracheostomy placement>she pulled out trach on 4/26, fortunately doing well without tracheostomy, site is healed.  Currently on room air- resp status stable Cont Yupelri, Brovana Pulmicort  Nebs, and bronchodilator PRN.  Acute  metabolic encephalopathy in the background of dementia Dysphagia: At baseline she would have conversation to herself/ she would be up all night inf 2 days in a row and sleep for 2-3 days and sometime wandering in th rod and oen time went in her vehicle and was lost- has bene kept in-house with locked door house, family had 2 sons and husband taking care of her.  Per grandaughter she was not taking any med for > 1 year although supposed to be on multiple meds for blood pressure,xanax,cholesteroal meds memory meds).  -Presented with unresponsiveness s/p extensive workup including  head CT negative for acute abnormality. Felt to be due to respiratory failure and also with ICU delirium.  Initially on core track>now tolerating DYS3 diet-need some assistance otherwise able to feed herself full meal . Off ivf.  Alert awake-first time today she was able to tell me she is in [facility], of note initially she was brought to [facility].  But remains confused, sitter has been placed since 5/9 night. Remains high risk for fall-requested psychiatry consultation to manage medication and optimize mental status. On bedtime Seroquel, added twice daily as needed Seroquel, on clonazepam twice daily hopefully can wean off.  1. Avoid/Minimize benzodiazepines, antihistamines, anticholinergics, and minimize opiate if possible 2: Assess, prevent and manage pain as lack of treatment can result in delirium.  3: Provide appropriate lighting and clear signage; a clock and calendar should be easily visible to the patient. 4: Monitor environmental factors. Reduce light and noise at night (close shades, turn off lights, turn off TV, ect). Correct any alterations in sleep cycle. 5: Reorient the patient to person, place, time and situation on each encounter.  6: Correct sensory deficits if possible (replace eye glasses, hearing aids, ect). 7: Avoid restraints if able. Severely delirious patients benefit from constant  observation by a sitter   Found on the floor on 5/2  and 5/8: small bruise on the back of the head on 5/2- CT head done and was negative. She was placed on one to one sitter ahd been off for few days until 5/9- back on sitter.  Continue full fall precaution supportive care delirium precaution.  Placement will be difficult question if she needs memory care  Type 2 diabetes mellitus blood sugar is controlled on SSILast Hb A1c is 6.0 last month.   Acute kidney injury:Resolved.    Pulmonary nodules:Incidental finding on CT 06/12/2022 - 2 mm and 4 mm noncalcified left upper lobe lung nodules.  She will need follow-up with CT scan in 12 months   Essential hypertension well contyrolled on  norvasc and Coreg  Chronic diastolic CHF currently euvolemic Net IO Since Admission: 7,118.16 mL [08/02/22 1034]   Hypokalemia resolved  Sling/debility/generalized weakness: Patient has been unsteady on ambulation PT OT working with her and recommending skilled nursing facility.  5/0 am able to sit up on her own and WALK TO the bathroom although somewhat unsteady procedure  Severe malnutrition augment diet as below RD following.  She reports she has been able to feed herself and eating full meal  Nutrition Problem: Severe Malnutrition Etiology: chronic illness (COPD, dementia) Signs/Symptoms: severe fat depletion, severe muscle depletion Interventions: Ensure Enlive (each supplement provides 350kcal and 20 grams of protein), Juven  DVT prophylaxis: enoxaparin (LOVENOX)  injection 30 mg Start: 07/02/22 1000 Code Status:   Code Status: DNR Family Communication: plan of care discussed with patient/no family  at bedside. I called and updated granddaughter [name]. Patient status is:  inpatient  because of awaiting SNF. Patient's granddaughter was able to be reached and has been updated by staffs.  Level of care: Progressive  Dispo:Anticipated disposition: Disposition pending at this time.  If mentation improves  will benefit to skilled nursing facility otherwise may need memory care.  Optimize medication psych consulted.  Objective: Vitals last 24 hrs: Vitals:   08/02/22 0500 08/02/22 0648 08/02/22 0811 08/02/22 0826  BP:  (!) 142/84  136/66  Pulse:  91 88 87  Resp:  18 16 16   Temp:  98.5 F (36.9 C)  97.9 F (36.6 C)  TempSrc:  Oral  Oral  SpO2:  98% 96% 95%  Weight: 37.4 kg     Height:       Weight change: 0 kg  Physical Examination: General exam: Aaox1-2, weak,older appearing HEENT:Oral mucosa moist, Ear/Nose WNL grossly, dentition normal. Respiratory system: bilaterally clear BS, no use of accessory muscle Cardiovascular system: S1 & S2 +, regular rate, JVD neg. Gastrointestinal system: Abdomen soft, NT,ND,BS+ Nervous System:Alert, awake, moving extremities and grossly nonfocal Extremities: LE ankle edema neg, lower extremities warm Skin: No rashes,no icterus. ZOX:WRUEAV muscle bulk,tone, power    Medications reviewed:  Scheduled Meds:  amLODipine  5 mg Oral Daily   arformoterol  15 mcg Nebulization BID   budesonide (PULMICORT) nebulizer solution  0.5 mg Nebulization BID   carvedilol  3.125 mg Oral BID WC   clonazePAM  0.5 mg Oral Daily   clonazePAM  0.5 mg Oral QHS   doxycycline  100 mg Oral Q12H   enoxaparin (LOVENOX) injection  30 mg Subcutaneous Daily   feeding supplement  237 mL Oral BID BM   mupirocin cream   Topical BID   nutrition supplement (JUVEN)  1 packet Oral BID BM   mouth rinse  15 mL Mouth Rinse 4 times per day   pantoprazole  40 mg Oral Daily   QUEtiapine  12.5 mg Oral BID   QUEtiapine  50 mg Oral QHS   revefenacin  175 mcg Nebulization Daily  Continuous Infusions:  sodium chloride 10 mL/hr at 07/01/22 0503    Diet Order             DIET DYS 3 Room service appropriate? Yes with Assist; Fluid consistency: Thin  Diet effective now                 Nutrition Problem: Severe Malnutrition Etiology: chronic illness (COPD, dementia) Signs/Symptoms:  severe fat depletion, severe muscle depletion Interventions: Ensure Enlive (each supplement provides 350kcal and 20 grams of protein), Juven   Intake/Output Summary (Last 24 hours) at 08/02/2022 1034 Last data filed at 08/02/2022 0820 Gross per 24 hour  Intake 360 ml  Output --  Net 360 ml   Net IO Since Admission: 7,118.16 mL [08/02/22 1034]  Wt Readings from Last 3 Encounters:  08/02/22 37.4 kg     Unresulted Labs (From admission, onward)     Start     Ordered   06/12/22 1810  Miscellaneous test (send-out)  Once,   R        06/12/22 1810          Data Reviewed: I have personally reviewed following labs and imaging studies CBC: Recent Labs  Lab 07/28/22 0148  WBC 8.7  HGB 13.5  HCT 41.0  MCV 94.5  PLT 267    Basic Metabolic Panel: Recent Labs  Lab 07/28/22 0148  NA 135  K 3.8  CL 104  CO2 25  GLUCOSE 109*  BUN 37*  CREATININE 0.80  CALCIUM 9.8   No results found for this or any previous visit (from the past 240 hour(s)).  Antimicrobials: Anti-infectives (From admission, onward)    Start     Dose/Rate Route Frequency Ordered Stop   07/31/22 1030  doxycycline (VIBRA-TABS) tablet 100 mg        100 mg Oral Every 12 hours 07/31/22 0932 08/05/22 0959   07/06/22 1000  cefTRIAXone (ROCEPHIN) 2 g in sodium chloride 0.9 % 100 mL IVPB        2 g 200 mL/hr over 30 Minutes Intravenous Every 24 hours 07/06/22 0848 07/08/22 0952   07/05/22 0200  piperacillin-tazobactam (ZOSYN) IVPB 3.375 g  Status:  Discontinued        3.375 g 12.5 mL/hr over 240 Minutes Intravenous Every 8 hours 07/04/22 1826 07/06/22 0848   07/04/22 1915  vancomycin (VANCOCIN) IVPB 1000 mg/200 mL premix  Status:  Discontinued        1,000 mg 200 mL/hr over 60 Minutes Intravenous Every 48 hours 07/04/22 1826 07/05/22 0827   07/04/22 1915  piperacillin-tazobactam (ZOSYN) IVPB 3.375 g        3.375 g 100 mL/hr over 30 Minutes Intravenous  Once 07/04/22 1826 07/04/22 1914   06/21/22 2300   piperacillin-tazobactam (ZOSYN) IVPB 3.375 g        3.375 g 12.5 mL/hr over 240 Minutes Intravenous Every 8 hours 06/21/22 1631 06/29/22 2009   06/21/22 1645  piperacillin-tazobactam (ZOSYN) IVPB 3.375 g  Status:  Discontinued        3.375 g 12.5 mL/hr over 240 Minutes Intravenous Every 8 hours 06/21/22 1630 06/21/22 1631   06/21/22 1645  piperacillin-tazobactam (ZOSYN) IVPB 3.375 g        3.375 g 100 mL/hr over 30 Minutes Intravenous STAT 06/21/22 1631 06/21/22 1900   06/14/22 1315  cefTRIAXone (ROCEPHIN) 2 g in sodium chloride 0.9 % 100 mL IVPB  Status:  Discontinued        2 g 200 mL/hr over 30 Minutes Intravenous Every 24 hours 06/14/22 1249 06/20/22 1415   06/12/22 2230  doxycycline (VIBRAMYCIN) 100 mg in sodium chloride 0.9 % 250 mL IVPB  Status:  Discontinued        100 mg 125 mL/hr over 120 Minutes Intravenous Every 12 hours 06/12/22 2139 06/14/22 1350      Culture/Microbiology    Component Value Date/Time   SDES BLOOD RIGHT ARM 07/04/2022 1812   SPECREQUEST  07/04/2022 1812    BOTTLES DRAWN AEROBIC ONLY Blood Culture adequate volume   CULT  07/04/2022 1812    NO GROWTH 5 DAYS Performed at [facility], [address], [place], [place] [address]    REPTSTATUS 07/09/2022 FINAL 07/04/2022 1812  Radiology Studies: No results found.   LOS: 51 days   Lanae Boast, MD [facility]  08/02/2022, 10:34 AM

## 2022-08-02 NOTE — TOC Progression Note (Addendum)
Transition of Care [facility]) - Progression Note    Patient Details  Name: Carla [name] MRN: [id number] Date of Birth: [birth year]  Transition of Care [facility] CM/SW Contact  Delilah Shan, LCSWA Phone Number: 08/02/2022, 5:10 PM  Clinical Narrative:     CSW delivered denial letter for SNF from [facility] to patients room. CSW to follow back up with dc plan SNF vrs. Memory care with patients Granddaughter . CSW will continue to follow.  Expected Discharge Plan: Skilled Nursing Facility Barriers to Discharge: Continued Medical Work up  Expected Discharge Plan and Services   Discharge Planning Services: CM Consult Post Acute Care Choice: Long Term Acute Care (LTAC)                                         Social Determinants of Health (SDOH) Interventions SDOH Screenings   Food Insecurity: No Food Insecurity (07/28/2022)  Housing: Low Risk  (07/28/2022)  Transportation Needs: No Transportation Needs (07/28/2022)  Utilities: Not At Risk (07/28/2022)  Tobacco Use: High Risk (07/28/2022)    Readmission Risk Interventions    07/22/2022    1:43 PM  Readmission Risk Prevention Plan  Transportation Screening Complete  Palliative Care Screening Complete

## 2022-08-02 NOTE — Consult Note (Addendum)
[facility] Face-to-Face Psychiatry Consult   Reason for Consult:  dementia with behavior, med recs Referring Physician:  Dr. Jonathon Bellows Patient Identification: ALEXXIA STANKIEWICZ MRN:  [id number] Principal Diagnosis: Acute respiratory failure with hypoxia and hypercapnia (HCC) Diagnosis:  Principal Problem:   Acute respiratory failure with hypoxia and hypercapnia (HCC) Active Problems:   COPD with exacerbation (HCC)   HTN (hypertension)   Tobacco abuse   Anxiety   Peripheral vascular disease (HCC)   Hyperglycemia   Pulmonary nodules   Dementia (HCC)   Acute metabolic encephalopathy   Protein-calorie malnutrition, severe (HCC)   Urinary retention   Hypervolemia   Encephalopathy acute   Tracheostomy in place (HCC)   AKI (acute kidney injury) (HCC)   Hypernatremia   Fever   Acute respiratory failure with hypercapnia (HCC)   Hyponatremia   Pressure injury of skin   Total Time spent with patient: 30 minutes  Subjective:   DAPHINE LOCH is a 77 y.o. female patient admitted with COPD exacerbation.  Patient seen and chart reviewed-patient is interviewed this afternoon with her safety sitter present in the room. Sitter reports family just left, and patient declined consent to speak with them.  Patient is an overall poor historian and is unable to provide answers to most history questions asked.. She is alert and oriented x 1 (self only). She tells me we are in [place], today is January 10. Patient then becomes irate stating " what difference does it make. I don't need to know what time it is,"She is not oriented to city, situation, year or month.  She is some what child like on interview, and very argumentative at times.  Patient describes her mood as "good".  Patient reports sleeping well yesterday but indicates that overall she sleeps poorly.   Patient remains difficult to assess at baseline 2/t dementia.On evaluation she is observed talking to her sitter and picking at the sheets and her blankets.  She is fairly groomed and appears to have been bathed. Her mood is euthymic and her affect is congruent and appropriate. Writer was unable to gain her attention but a few times during the assessment ( she responded to her name being called, and when trying to redirect her). Her safety sitter is with her and is attempting to assist with orientation. Patient appears to improving from when she originally presented to the ER. Per chart review she continues to have restlessness at this time, however is improved with hands on care, ambulation and talking to the patient. Patient may benefit from skilled nursing rehab or assisted living facility. Unable to assess suicidality at this time.    She may experience some confusion, agitation, and restlessness as well as [place] this is completely normal behavior in a person of this age. It is felt that patient may need higher level of care due to worsening aggression, confusion, agitation, and restlessness. Suspect her behaviors may be worsened due to remaining in the hospital for over 51 days. Patient behaviors are consistent with a person with dementia and other memory loss. SNF,  ALF, Home Health unit would be appropriate as they are well equipped to help elderly persons with memory loss, maintain cognitive skills and help with quality of life. Patient is able to take care of self, however needs more social interaction and person centered care to help with worsening aggression and difficult dementia behaviors.Patient current psychiatric symptoms do not meet criteria for inpatient psychiatric admission.    HPI:  83 yof w/ COPD/emphysema tobacco use,Hypertension,  Dementia, Anxiety, Depression, GERD, Primary hyperparathyroidism, Vit D deficiency, HLD, Osteoporosis , found by family unresponsive and brought to ER on 06/12/22 and found to have acute on chronic hypercapnia on ABG>Intubated in ER and treated for COPD exacerbation. She had prolonged complicated hospitalization  charted as below with multiple ICU admissions, needing tracheostomy placement, having encephalopathy with baseline dementia and pulling out her PICC line tracheostomy.  Past Psychiatric History: Unable to assess due to dementia  Risk to Self:   Denies Risk to Others:   Denies Prior Inpatient Therapy:   Unable to assess Prior Outpatient Therapy:   Unable to assess  Past Medical History: History reviewed. No pertinent past medical history. History reviewed. No pertinent surgical history. Family History: History reviewed. No pertinent family history. Family Psychiatric  History: Unable to assess Social History:  Social History   Substance and Sexual Activity  Alcohol Use Not Currently     Social History   Substance and Sexual Activity  Drug Use Not Currently    Social History   Socioeconomic History   Marital status: Unknown    Spouse name: Not on file   Number of children: Not on file   Years of education: Not on file   Highest education level: Not on file  Occupational History   Not on file  Tobacco Use   Smoking status: Every Day    Packs/day: 1    Types: Cigarettes   Smokeless tobacco: Never  Vaping Use   Vaping Use: Never used  Substance and Sexual Activity   Alcohol use: Not Currently   Drug use: Not Currently   Sexual activity: Not Currently  Other Topics Concern   Not on file  Social History Narrative   Not on file   Social Determinants of Health   Financial Resource Strain: Not on file  Food Insecurity: No Food Insecurity (07/28/2022)   Hunger Vital Sign    Worried About Running Out of Food in the Last Year: Never true    Ran Out of Food in the Last Year: Never true  Transportation Needs: No Transportation Needs (07/28/2022)   PRAPARE - Administrator, Civil Service (Medical): No    Lack of Transportation (Non-Medical): No  Physical Activity: Not on file  Stress: Not on file  Social Connections: Not on file   Additional Social History:     Allergies:   Allergies  Allergen Reactions   Aricept [Donepezil Hcl] Diarrhea   Calcium-Containing Compounds    Exelon [Rivastigmine] Rash    Labs: No results found for this or any previous visit (from the past 48 hour(s)).  Current Facility-Administered Medications  Medication Dose Route Frequency Provider Last Rate Last Admin   0.9 %  sodium chloride infusion   Intravenous Continuous Coralyn Helling, MD 10 mL/hr at 07/01/22 0503 Restarted at 07/01/22 0503   acetaminophen (TYLENOL) tablet 650 mg  650 mg Oral Q6H PRN Kai Levins, RPH   650 mg at 07/25/22 2107   Or   acetaminophen (TYLENOL) suppository 650 mg  650 mg Rectal Q6H PRN Jeanella Craze, Dwayne A, RPH       albuterol (PROVENTIL) (2.5 MG/3ML) 0.083% nebulizer solution 2.5 mg  2.5 mg Nebulization Q4H PRN Coralyn Helling, MD   2.5 mg at 06/17/22 1059   amLODipine (NORVASC) tablet 5 mg  5 mg Oral Daily Kathlen Mody, MD   5 mg at 08/02/22 1000   arformoterol (BROVANA) nebulizer solution 15 mcg  15 mcg Nebulization BID Coralyn Helling, MD  15 mcg at 08/02/22 0812   budesonide (PULMICORT) nebulizer solution 0.5 mg  0.5 mg Nebulization BID Coralyn Helling, MD   0.5 mg at 08/02/22 0811   carvedilol (COREG) tablet 3.125 mg  3.125 mg Oral BID WC Kathlen Mody, MD   3.125 mg at 08/02/22 1000   clonazePAM (KLONOPIN) tablet 0.5 mg  0.5 mg Oral Daily Lodema Hong A, RPH   0.5 mg at 08/02/22 1000   clonazePAM (KLONOPIN) tablet 0.5 mg  0.5 mg Oral QHS Pierce, Dwayne A, RPH   0.5 mg at 08/01/22 2007   doxycycline (VIBRA-TABS) tablet 100 mg  100 mg Oral Q12H Kc, Dayna Barker, MD   100 mg at 08/02/22 1000   enoxaparin (LOVENOX) injection 30 mg  30 mg Subcutaneous Daily Olalere, Adewale A, MD   30 mg at 08/02/22 0959   feeding supplement (ENSURE ENLIVE / ENSURE PLUS) liquid 237 mL  237 mL Oral BID BM Kathlen Mody, MD   237 mL at 08/02/22 1401   haloperidol lactate (HALDOL) injection 1-2 mg  1-2 mg Intramuscular Q6H PRN Hillary Bow, DO   2 mg at 08/01/22 1924    lip balm (BLISTEX) ointment   Topical PRN Mannam, Praveen, MD       mupirocin cream (BACTROBAN) 2 %   Topical BID Kc, Dayna Barker, MD   1 Application at 08/02/22 1000   nutrition supplement (JUVEN) (JUVEN) powder packet 1 packet  1 packet Oral BID BM Kathlen Mody, MD   1 packet at 08/02/22 0959   ondansetron (ZOFRAN) tablet 4 mg  4 mg Oral Q6H PRN Jeanella Craze, Dwayne A, RPH       Or   ondansetron (ZOFRAN) injection 4 mg  4 mg Intravenous Q6H PRN Kai Levins, RPH       Oral care mouth rinse  15 mL Mouth Rinse 4 times per day Lanae Boast, MD   15 mL at 08/02/22 1203   Oral care mouth rinse  15 mL Mouth Rinse PRN Kc, Dayna Barker, MD       pantoprazole (PROTONIX) EC tablet 40 mg  40 mg Oral Daily Mosetta Anis, RPH   40 mg at 08/02/22 1000   polyethylene glycol (MIRALAX / GLYCOLAX) packet 17 g  17 g Oral Daily PRN Pierce, Dwayne A, RPH       QUEtiapine (SEROQUEL) tablet 12.5 mg  12.5 mg Oral BID Starkes-Perry, Takia S, FNP       QUEtiapine (SEROQUEL) tablet 50 mg  50 mg Oral QHS Kc, Dayna Barker, MD   50 mg at 08/01/22 2007   revefenacin (YUPELRI) nebulizer solution 175 mcg  175 mcg Nebulization Daily Coralyn Helling, MD   175 mcg at 08/02/22 8469   senna (SENOKOT) tablet 8.6 mg  1 tablet Oral Daily PRN Kai Levins, RPH        Musculoskeletal: Strength & Muscle Tone: within normal limits Gait & Station: unable to stand Patient leans: N/A  Psychiatric Specialty Exam:  Presentation  General Appearance:  Appropriate for Environment; Casual  Eye Contact: Fair  Speech: Clear and Coherent; Normal Rate  Speech Volume: Normal  Handedness: Right   Mood and Affect  Mood: Irritable; Anxious  Affect: Congruent   Thought Process  Thought Processes: Coherent; Linear  Descriptions of Associations:Intact  Orientation:Full (Time, Place and Person)  Thought Content:Logical  History of Schizophrenia/Schizoaffective disorder:No data recorded Duration of Psychotic Symptoms:No data  recorded Hallucinations:Hallucinations: None  Ideas of Reference:None  Suicidal Thoughts:Suicidal Thoughts: No  Homicidal Thoughts:Homicidal Thoughts: No  Sensorium  Memory: Immediate Poor; Recent Poor; Remote Poor  Judgment: Impaired  Insight: Poor   Executive Functions  Concentration: Poor  Attention Span: Poor  Recall: Poor  Fund of Knowledge: Poor  Language: Poor   Psychomotor Activity  Psychomotor Activity:No data recorded  Assets  Assets: Financial Resources/Insurance; Desire for Improvement; Communication Skills; Talents/Skills; Transportation; Housing; Physical Health   Sleep  Sleep: Sleep: Fair   Physical Exam: Physical Exam Vitals and nursing note reviewed.  Constitutional:      Appearance: Normal appearance.  Neurological:     General: No focal deficit present.     Mental Status: She is alert and oriented to person, place, and time. Mental status is at baseline.  Psychiatric:        Attention and Perception: Attention and perception normal.        Mood and Affect: Mood and affect normal.        Speech: Speech normal.        Behavior: Behavior normal. Behavior is cooperative.        Thought Content: Thought content normal.        Cognition and Memory: Cognition and memory normal.        Judgment: Judgment normal.    ROS Blood pressure 137/75, pulse 98, temperature 97.6 F (36.4 C), temperature source Oral, resp. rate 18, height 5' (1.524 m), weight 37.4 kg, SpO2 96 %. Body mass index is 16.1 kg/m.  Treatment Plan Summary: Plan   Continue delirium precautions.   Will start Seroquel 12.5mg  po BID (8 and 1400), Seroquel 50mg  po qhs. EKG ordered on 13*08*65, yet to be obtained. Patient at risk for developing QTc prolongation. She is currently on cardiac progressive unit. Continue prn Haldol for severe agitation.   Reduce use of BZD at this time. If BZD are used consider short acting (Ativan ) in place of Klonopin. DC Klonopin  0.5mg  po BID.   Continue safety sitter due to high risk for falls.  While future psychiatric events cannot be accurately predicted, the patient does not currently require acute inpatient psychiatric care and does not currently meet [facility] involuntary commitment criteria.   TOC for proper placement and safe disposition options at this time.    Labs reviewed (obtained 05/05) within normal limits, except Albumin.   Psych consult placed for med recs in demented behaviors, see above recs. Psych consult to sign off at this time.   Disposition: No evidence of imminent risk to self or others at present.   Patient does not meet criteria for psychiatric inpatient admission. Supportive therapy provided about ongoing stressors. Refer to IOP. Discussed crisis plan, support from social network, calling 911, coming to the Emergency Department, and calling Suicide Hotline.  Maryagnes Amos, FNP 08/02/2022 3:28 PM

## 2022-08-02 NOTE — TOC Progression Note (Incomplete Revision)
Transition of Care [facility]) - Progression Note    Patient Details  Name: [name] MRN: [id number] Date of Birth: [birth year]  Transition of Care [facility] CM/SW Contact  Delilah Shan, LCSWA Phone Number: 08/02/2022, 5:10 PM  Clinical Narrative:     CSW delivered denial letter for SNF from [facility] to patients room. CSW to follow back up with dc plan SNF vrs. Memory care with patients Granddaughter . CSW will continue to follow.  Expected Discharge Plan: Skilled Nursing Facility Barriers to Discharge: Continued Medical Work up  Expected Discharge Plan and Services   Discharge Planning Services: CM Consult Post Acute Care Choice: Long Term Acute Care (LTAC)                                         Social Determinants of Health (SDOH) Interventions SDOH Screenings   Food Insecurity: No Food Insecurity (07/28/2022)  Housing: Low Risk  (07/28/2022)  Transportation Needs: No Transportation Needs (07/28/2022)  Utilities: Not At Risk (07/28/2022)  Tobacco Use: High Risk (07/28/2022)    Readmission Risk Interventions    07/22/2022    1:43 PM  Readmission Risk Prevention Plan  Transportation Screening Complete  Palliative Care Screening Complete

## 2022-08-03 DIAGNOSIS — E43 Unspecified severe protein-calorie malnutrition: Secondary | ICD-10-CM | POA: Diagnosis not present

## 2022-08-03 DIAGNOSIS — J9621 Acute and chronic respiratory failure with hypoxia: Secondary | ICD-10-CM | POA: Diagnosis not present

## 2022-08-03 DIAGNOSIS — R578 Other shock: Secondary | ICD-10-CM | POA: Diagnosis not present

## 2022-08-03 DIAGNOSIS — T17490A Other foreign object in trachea causing asphyxiation, initial encounter: Secondary | ICD-10-CM | POA: Diagnosis not present

## 2022-08-03 DIAGNOSIS — Z1152 Encounter for screening for COVID-19: Secondary | ICD-10-CM | POA: Diagnosis not present

## 2022-08-03 DIAGNOSIS — R7881 Bacteremia: Secondary | ICD-10-CM | POA: Diagnosis not present

## 2022-08-03 DIAGNOSIS — J14 Pneumonia due to Hemophilus influenzae: Secondary | ICD-10-CM | POA: Diagnosis not present

## 2022-08-03 DIAGNOSIS — J441 Chronic obstructive pulmonary disease with (acute) exacerbation: Secondary | ICD-10-CM | POA: Diagnosis not present

## 2022-08-03 DIAGNOSIS — R6521 Severe sepsis with septic shock: Secondary | ICD-10-CM | POA: Diagnosis not present

## 2022-08-03 DIAGNOSIS — A413 Sepsis due to Hemophilus influenzae: Secondary | ICD-10-CM | POA: Diagnosis not present

## 2022-08-03 DIAGNOSIS — Z66 Do not resuscitate: Secondary | ICD-10-CM | POA: Diagnosis not present

## 2022-08-03 DIAGNOSIS — G9341 Metabolic encephalopathy: Secondary | ICD-10-CM | POA: Diagnosis not present

## 2022-08-03 MED ORDER — HALOPERIDOL LACTATE 5 MG/ML IJ SOLN
2.5000 mg | INTRAMUSCULAR | Status: DC | PRN
  Administered 2022-08-04 (×3): 2.5 mg via INTRAMUSCULAR
  Filled 2022-08-03 (×3): qty 1

## 2022-08-03 NOTE — Progress Notes (Addendum)
[facility] PROGRESS NOTE   Carla [name] ZOX:[id number] DOB: Aug 22, 1945 DOA: 06/12/2022  PCP: Patient, [name]  Brief History/Interval Summary: 61 yof w/ COPD/emphysema tobacco use, Hypertension, Dementia, Anxiety, Depression, GERD, Primary hyperparathyroidism, Vit D deficiency, HLD, Osteoporosis, found by family unresponsive and brought to ER on 06/12/22 and found to have acute on chronic hypercapnia on ABG>Intubated in ER and treated for COPD exacerbation. She had prolonged complicated hospitalization charted as below with multiple ICU admissions, needing tracheostomy placement, having encephalopathy with baseline dementia and pulling out her PICC line tracheostomy.   Significant Events  3/20 Admitted after being found unresponsive, intubated on ED arrival  CT angio chest >> atherosclerosis, severe coronary calcification, severe LVH, mild centrilobular emphysema, 2 mm nodule LUL, 4 mm nodule LUL Blood culture  >> Haemophilus influenzae 3/22 Had oxygen desaturation and low Vt with SBT this morning. Echo 06/14/22 >> EF 60 to 65%, mod LVH, grade 1 DD, mild/mod AR 3/25 bronch for peak pressures, low tidal volumes with mucus cast at end of ETT, thick secretions caking tube 3/29 Bronched again with purulent secretions LLL with plugging and LLL atelectasis, some increase in O2 requirement on vent, zosyn started for VAP coverage. MRSA nare swab today negative.  4/2 family meeting, made DNR, plans for one way extubation 4/5 4/6-family changes mind regarding one-way extubation, plan will be for tracheostomy 4/8 trach placed 4/18 Cortrak clogged and thus replaced. Still working on weaning completed CTx for PNA. 48 hrs on ATC  4/19 off vent x 72 hours so removed from room.  Planning on changing trach to cuffless.  Continuing to work with PMV.  Hypoglycemia adding D5 to maintenance fluids 4/26: Patient pulled tracheostomy tube.  4/28: Patient pulled PICC line, PIV inserted, Soft  restraints.  Consultants: Pulmonology.  Psychiatry    Subjective/Interval History: Patient confused.  Noted to be on restraints.    Assessment/Plan:  Acute hypoxic/hypercapnic respiratory failure: Complicated hospital course w/ intubation, tracheostomy placement>she pulled out trach on 4/26.  Fortunately doing well without tracheostomy, site is healed.   Currently on room air. Cont Yupelri, Brovana Pulmicort  Nebs, and bronchodilator PRN. Noted to be on doxycycline since May 8.  So 5-day course.   Acute metabolic encephalopathy in the background of dementia Based on previous reports it looks like patient at baseline with current conversations with herself and drove wander out of the house.    Presented with unresponsiveness s/p extensive workup including  head CT negative for acute abnormality. Felt to be due to respiratory failure and also with ICU delirium.   Patient remains confused.  Psychiatry has been following.  Patient is on Seroquel.  Dysphagia Initially on core track>now tolerating DYS3 diet.    Multiple falls in the hospital  Found on the floor on 5/2  and 5/8.  Underwent imaging studies which did not show any injuries.   Type 2 diabetes mellitus  HbA1c 6.0.  Monitor CBGs.   Acute kidney injury Resolved.    Pulmonary nodules Incidental finding on CT 06/12/2022 - 2 mm and 4 mm noncalcified left upper lobe lung nodules.  She will need follow-up with CT scan in 12 months   Essential hypertension Well contyrolled on norvasc and Coreg   Chronic diastolic CHF  Currently euvolemic   Hypokalemia Resolved   Physical deconditioning PT and OT has not seen her.  SNF is recommended.  Difficult to place.  Social worker is following.     Severe malnutrition  Encourage oral intake.  On Ensure.   DVT prophylaxis: Lovenox Code Status:   DNR Family Communication: No family at bedside Disposition: SNF.  Difficult to place.     Medications: Scheduled:  amLODipine   5 mg Oral Daily   arformoterol  15 mcg Nebulization BID   budesonide (PULMICORT) nebulizer solution  0.5 mg Nebulization BID   carvedilol  3.125 mg Oral BID WC   doxycycline  100 mg Oral Q12H   enoxaparin (LOVENOX) injection  30 mg Subcutaneous Daily   feeding supplement  237 mL Oral BID BM   mupirocin cream   Topical BID   nicotine  21 mg Transdermal Daily   nutrition supplement (JUVEN)  1 packet Oral BID BM   mouth rinse  15 mL Mouth Rinse 4 times per day   pantoprazole  40 mg Oral Daily   QUEtiapine  12.5 mg Oral BID   QUEtiapine  50 mg Oral QHS   revefenacin  175 mcg Nebulization Daily   Continuous:  sodium chloride 10 mL/hr at 07/01/22 0503   NWG:[id number] **OR** acetaminophen, albuterol, haloperidol lactate, lip balm, ondansetron **OR** ondansetron (ZOFRAN) IV, mouth rinse, polyethylene glycol, senna  Antibiotics: Anti-infectives (From admission, onward)    Start     Dose/Rate Route Frequency Ordered Stop   07/31/22 1030  doxycycline (VIBRA-TABS) tablet 100 mg        100 mg Oral Every 12 hours 07/31/22 0932 08/05/22 0959   07/06/22 1000  cefTRIAXone (ROCEPHIN) 2 g in sodium chloride 0.9 % 100 mL IVPB        2 g 200 mL/hr over 30 Minutes Intravenous Every 24 hours 07/06/22 0848 07/08/22 0952   07/05/22 0200  piperacillin-tazobactam (ZOSYN) IVPB 3.375 g  Status:  Discontinued        3.375 g 12.5 mL/hr over 240 Minutes Intravenous Every 8 hours 07/04/22 1826 07/06/22 0848   07/04/22 1915  vancomycin (VANCOCIN) IVPB 1000 mg/200 mL premix  Status:  Discontinued        1,000 mg 200 mL/hr over 60 Minutes Intravenous Every 48 hours 07/04/22 1826 07/05/22 0827   07/04/22 1915  piperacillin-tazobactam (ZOSYN) IVPB 3.375 g        3.375 g 100 mL/hr over 30 Minutes Intravenous  Once 07/04/22 1826 07/04/22 1914   06/21/22 2300  piperacillin-tazobactam (ZOSYN) IVPB 3.375 g        3.375 g 12.5 mL/hr over 240 Minutes Intravenous Every 8 hours 06/21/22 1631 06/29/22 2009   06/21/22  1645  piperacillin-tazobactam (ZOSYN) IVPB 3.375 g  Status:  Discontinued        3.375 g 12.5 mL/hr over 240 Minutes Intravenous Every 8 hours 06/21/22 1630 06/21/22 1631   06/21/22 1645  piperacillin-tazobactam (ZOSYN) IVPB 3.375 g        3.375 g 100 mL/hr over 30 Minutes Intravenous STAT 06/21/22 1631 06/21/22 1900   06/14/22 1315  cefTRIAXone (ROCEPHIN) 2 g in sodium chloride 0.9 % 100 mL IVPB  Status:  Discontinued        2 g 200 mL/hr over 30 Minutes Intravenous Every 24 hours 06/14/22 1249 06/20/22 1415   06/12/22 2230  doxycycline (VIBRAMYCIN) 100 mg in sodium chloride 0.9 % 250 mL IVPB  Status:  Discontinued        100 mg 125 mL/hr over 120 Minutes Intravenous Every 12 hours 06/12/22 2139 06/14/22 1350       Objective:  Vital Signs  Vitals:   08/02/22 2332 08/03/22 0703 08/03/22 0745 08/03/22 0747  BP: (!) 153/88 (!) 152/82  Pulse: 84 84    Resp: 18 18    Temp: 97.8 F (36.6 C) 97.8 F (36.6 C)    TempSrc: Oral Oral    SpO2:   95% 96%  Weight:      Height:        Intake/Output Summary (Last 24 hours) at 08/03/2022 0912 Last data filed at 08/02/2022 1241 Gross per 24 hour  Intake 120 ml  Output --  Net 120 ml   Filed Weights   07/30/22 0551 08/01/22 0342 08/02/22 0500  Weight: 38.6 kg 37.4 kg 37.4 kg    General appearance: Awake alert.  In no distress.  Distracted.  In restraints. Resp: Clear to auscultation bilaterally.  Normal effort Cardio: S1-S2 is normal regular.  No S3-S4.  No rubs murmurs or bruit GI: Abdomen is soft.  Nontender nondistended.  Bowel sounds are present normal.  No masses organomegaly Extremities: No edema.  Neurologic: Disoriented.  Moving all of her extremities.   Lab Results:  Data Reviewed: I have personally reviewed following labs and reports of the imaging studies  CBC: Recent Labs  Lab 07/28/22 0148  WBC 8.7  HGB 13.5  HCT 41.0  MCV 94.5  PLT 267    Basic Metabolic Panel: Recent Labs  Lab 07/28/22 0148  NA  135  K 3.8  CL 104  CO2 25  GLUCOSE 109*  BUN 37*  CREATININE 0.80  CALCIUM 9.8    GFR: Estimated Creatinine Clearance: 34.8 mL/min (by C-G formula based on SCr of 0.8 mg/dL).  Liver Function Tests: Recent Labs  Lab 07/28/22 0148  AST 20  ALT 16  ALKPHOS 93  BILITOT 0.3  PROT 6.4*  ALBUMIN 2.9*     Radiology Studies: No results found.     LOS: 52 days   Faye Sanfilippo [employer] on [email or web address]  08/03/2022, 9:12 AM

## 2022-08-04 DIAGNOSIS — G9341 Metabolic encephalopathy: Secondary | ICD-10-CM | POA: Diagnosis not present

## 2022-08-04 LAB — BASIC METABOLIC PANEL
CO2: 21 mmol/L — ABNORMAL LOW (ref 22–32)
Glucose, Bld: 169 mg/dL — ABNORMAL HIGH (ref 70–99)

## 2022-08-04 LAB — CBC
HCT: 45.4 % (ref 36.0–46.0)
Hemoglobin: 14.6 g/dL (ref 12.0–15.0)
Platelets: 349 10*3/uL (ref 150–400)
WBC: 9.9 10*3/uL (ref 4.0–10.5)
nRBC: 0 % (ref 0.0–0.2)

## 2022-08-04 LAB — TSH: TSH: 2.497 u[IU]/mL (ref 0.350–4.500)

## 2022-08-04 LAB — FOLATE: Folate: 10.6 ng/mL (ref >5.9)

## 2022-08-04 MED ORDER — QUETIAPINE FUMARATE 25 MG PO TABS
25.0000 mg | ORAL_TABLET | Freq: Two times a day (BID) | ORAL | Status: DC
  Administered 2022-08-04 – 2022-08-29 (×46): 25 mg via ORAL
  Filled 2022-08-04 (×50): qty 1

## 2022-08-04 NOTE — Progress Notes (Addendum)
[facility] PROGRESS NOTE   Carla [name] ZOX:[id number] DOB: Apr 23, 1945 DOA: 06/12/2022  PCP: Patient, [name]  Brief History/Interval Summary: 17 yof w/ COPD/emphysema tobacco use, Hypertension, Dementia, Anxiety, Depression, GERD, Primary hyperparathyroidism, Vit D deficiency, HLD, Osteoporosis, found by family unresponsive and brought to ER on 06/12/22 and found to have acute on chronic hypercapnia on ABG>Intubated in ER and treated for COPD exacerbation. She had prolonged complicated hospitalization charted as below with multiple ICU admissions, needing tracheostomy placement, having encephalopathy with baseline dementia and pulling out her PICC line tracheostomy.   Significant Events  3/20 Admitted after being found unresponsive, intubated on ED arrival  CT angio chest >> atherosclerosis, severe coronary calcification, severe LVH, mild centrilobular emphysema, 2 mm nodule LUL, 4 mm nodule LUL Blood culture  >> Haemophilus influenzae 3/22 Had oxygen desaturation and low Vt with SBT this morning. Echo 06/14/22 >> EF 60 to 65%, mod LVH, grade 1 DD, mild/mod AR 3/25 bronch for peak pressures, low tidal volumes with mucus cast at end of ETT, thick secretions caking tube 3/29 Bronched again with purulent secretions LLL with plugging and LLL atelectasis, some increase in O2 requirement on vent, zosyn started for VAP coverage. MRSA nare swab today negative.  4/2 family meeting, made DNR, plans for one way extubation 4/5 4/6-family changes mind regarding one-way extubation, plan will be for tracheostomy 4/8 trach placed 4/18 Cortrak clogged and thus replaced. Still working on weaning completed CTx for PNA. 48 hrs on ATC  4/19 off vent x 72 hours so removed from room.  Planning on changing trach to cuffless.  Continuing to work with PMV.  Hypoglycemia adding D5 to maintenance fluids 4/26: Patient pulled tracheostomy tube.  4/28: Patient pulled PICC line, PIV inserted, Soft  restraints.  Consultants: Pulmonology.  Psychiatry    Subjective/Interval History: Patient sitting at the side of the bed.  Denies any complaints.  Seems to be mildly distracted.  Overnight events noted.  Sitter at bedside.    Assessment/Plan:  Acute hypoxic/hypercapnic respiratory failure: Complicated hospital course w/ intubation, tracheostomy placement>she pulled out trach on 4/26.  Fortunately doing well without tracheostomy, site is healed.   Currently on room air. Cont Yupelri, Brovana Pulmicort  Nebs, and bronchodilator PRN. Noted to be on doxycycline since May 8.  5-day course.   Acute metabolic encephalopathy in the background of dementia Based on previous reports it looks like at baseline patient has regular conversations with herself and wanders out of the house.    Presented with unresponsiveness s/p extensive workup including  head CT negative for acute abnormality. Felt to be due to respiratory failure and also with ICU delirium.   Patient remains confused.  Psychiatry has been following.  Patient is on Seroquel. Episodes of agitation noted during the night and day.  Will increase the dose of Seroquel.  EKG done yesterday morning showed normal QT interval. Currently off of benzodiazepines.  Dysphagia Initially on core track>now tolerating DYS3 diet.    Multiple falls in the hospital  Found on the floor on 5/2  and 5/8.  Underwent imaging studies which did not show any injuries.   Type 2 diabetes mellitus  HbA1c 6.0.  Monitor CBGs.   Acute kidney injury Resolved.    Pulmonary nodules Incidental finding on CT 06/12/2022 - 2 mm and 4 mm noncalcified left upper lobe lung nodules.  She will need follow-up with CT scan in 12 months   Essential hypertension Well contyrolled on norvasc and Coreg. Basic metabolic  panel shows mildly elevated creatinine.  Patient encouraged to stay well-hydrated.  Will discuss with nursing staff.  Will recheck labs in 48 hours.    Chronic diastolic CHF  Currently euvolemic   Hypokalemia Resolved   Physical deconditioning PT and OT has not seen her.  SNF is recommended.  Difficult to place.  Social worker is following.     Severe malnutrition  Encourage oral intake.  On Ensure.   DVT prophylaxis: Lovenox Code Status:   DNR Family Communication: No family at bedside Disposition: SNF.  Difficult to place.     Medications: Scheduled:  amLODipine  5 mg Oral Daily   arformoterol  15 mcg Nebulization BID   budesonide (PULMICORT) nebulizer solution  0.5 mg Nebulization BID   carvedilol  3.125 mg Oral BID WC   doxycycline  100 mg Oral Q12H   enoxaparin (LOVENOX) injection  30 mg Subcutaneous Daily   feeding supplement  237 mL Oral BID BM   mupirocin cream   Topical BID   nicotine  21 mg Transdermal Daily   nutrition supplement (JUVEN)  1 packet Oral BID BM   mouth rinse  15 mL Mouth Rinse 4 times per day   pantoprazole  40 mg Oral Daily   QUEtiapine  25 mg Oral BID   QUEtiapine  50 mg Oral QHS   revefenacin  175 mcg Nebulization Daily   Continuous:  sodium chloride 10 mL/hr at 07/01/22 0503   OZD:[id number] **OR** acetaminophen, albuterol, haloperidol lactate, lip balm, ondansetron **OR** ondansetron (ZOFRAN) IV, mouth rinse, polyethylene glycol, senna  Antibiotics: Anti-infectives (From admission, onward)    Start     Dose/Rate Route Frequency Ordered Stop   07/31/22 1030  doxycycline (VIBRA-TABS) tablet 100 mg        100 mg Oral Every 12 hours 07/31/22 0932 08/05/22 0959   07/06/22 1000  cefTRIAXone (ROCEPHIN) 2 g in sodium chloride 0.9 % 100 mL IVPB        2 g 200 mL/hr over 30 Minutes Intravenous Every 24 hours 07/06/22 0848 07/08/22 0952   07/05/22 0200  piperacillin-tazobactam (ZOSYN) IVPB 3.375 g  Status:  Discontinued        3.375 g 12.5 mL/hr over 240 Minutes Intravenous Every 8 hours 07/04/22 1826 07/06/22 0848   07/04/22 1915  vancomycin (VANCOCIN) IVPB 1000 mg/200 mL premix   Status:  Discontinued        1,000 mg 200 mL/hr over 60 Minutes Intravenous Every 48 hours 07/04/22 1826 07/05/22 0827   07/04/22 1915  piperacillin-tazobactam (ZOSYN) IVPB 3.375 g        3.375 g 100 mL/hr over 30 Minutes Intravenous  Once 07/04/22 1826 07/04/22 1914   06/21/22 2300  piperacillin-tazobactam (ZOSYN) IVPB 3.375 g        3.375 g 12.5 mL/hr over 240 Minutes Intravenous Every 8 hours 06/21/22 1631 06/29/22 2009   06/21/22 1645  piperacillin-tazobactam (ZOSYN) IVPB 3.375 g  Status:  Discontinued        3.375 g 12.5 mL/hr over 240 Minutes Intravenous Every 8 hours 06/21/22 1630 06/21/22 1631   06/21/22 1645  piperacillin-tazobactam (ZOSYN) IVPB 3.375 g        3.375 g 100 mL/hr over 30 Minutes Intravenous STAT 06/21/22 1631 06/21/22 1900   06/14/22 1315  cefTRIAXone (ROCEPHIN) 2 g in sodium chloride 0.9 % 100 mL IVPB  Status:  Discontinued        2 g 200 mL/hr over 30 Minutes Intravenous Every 24 hours 06/14/22 1249 06/20/22 1415  06/12/22 2230  doxycycline (VIBRAMYCIN) 100 mg in sodium chloride 0.9 % 250 mL IVPB  Status:  Discontinued        100 mg 125 mL/hr over 120 Minutes Intravenous Every 12 hours 06/12/22 2139 06/14/22 1350       Objective:  Vital Signs  Vitals:   08/03/22 0747 08/03/22 1129 08/03/22 1734 08/04/22 0903  BP:  (!) 143/84 (!) 157/92 136/78  Pulse:  91  88  Resp:  (!) 21    Temp:  97.6 F (36.4 C)    TempSrc:  Oral    SpO2: 96% 95%    Weight:      Height:        Intake/Output Summary (Last 24 hours) at 08/04/2022 0943 Last data filed at 08/03/2022 2300 Gross per 24 hour  Intake 60 ml  Output --  Net 60 ml    Filed Weights   07/30/22 0551 08/01/22 0342 08/02/22 0500  Weight: 38.6 kg 37.4 kg 37.4 kg    General appearance: Awake alert.  In no distress.  Distracted Resp: Clear to auscultation bilaterally.  Normal effort Cardio: S1-S2 is normal regular.  No S3-S4.  No rubs murmurs or bruit GI: Abdomen is soft.  Nontender nondistended.   Bowel sounds are present normal.  No masses organomegaly Extremities: No edema.     Lab Results:  Data Reviewed: I have personally reviewed following labs and reports of the imaging studies  CBC: Recent Labs  Lab 08/04/22 0249  WBC 9.9  HGB 14.6  HCT 45.4  MCV 94.8  PLT 349     Basic Metabolic Panel: Recent Labs  Lab 08/04/22 0249  NA 134*  K 4.4  CL 103  CO2 21*  GLUCOSE 169*  BUN 28*  CREATININE 1.07*  CALCIUM 10.1     GFR: Estimated Creatinine Clearance: 26 mL/min (A) (by C-G formula based on SCr of 1.07 mg/dL (H)).   Radiology Studies: No results found.     LOS: 53 days   Merriam Brandner [employer] on [email or web address]  08/04/2022, 9:43 AM

## 2022-08-05 NOTE — Progress Notes (Addendum)
Physical Therapy Treatment Patient Details Name:  Carla [name]  MRN:  DOB:  Today's Date: 08/05/2022   History of Present Illness 77 yo female admitted 3/20 unresponsive from home with acute respiratory failure with hypoxia and hypercapnia and metabolic encephalopathy. Intubated 3/20. trach 4/8. Self decannulated. PMhx: emphysema, tobacco abuse, PVD, HTN, prediabetes, anxiety, dementia    PT Comments    Pt making good progress with mobility. Believe she can reach a level of mobility where she will only need supervision. Patient will benefit from continued post acute therapy, <3 hours/day    Recommendations for follow up therapy are one component of a multi-disciplinary discharge planning process, led by the attending physician.  Recommendations may be updated based on patient status, additional functional criteria and insurance authorization.  Follow Up Recommendations  Can patient physically be transported by private vehicle: Yes    Assistance Recommended at Discharge Frequent or constant Supervision/Assistance  Patient can return home with the following Direct supervision/assist for medications management;Assistance with cooking/housework;Assist for transportation;A little help with walking and/or transfers;A little help with bathing/dressing/bathroom   Equipment Recommendations  Rolling walker (2 wheels)    Recommendations for Other Services       Precautions / Restrictions Precautions Precautions: Fall     Mobility  Bed Mobility               General bed mobility comments: Pt up in chair    Transfers Overall transfer level: Needs assistance Equipment used: Rolling walker (2 wheels) Transfers: Sit to/from Stand Sit to Stand: Min guard           General transfer comment: Assist for safety    Ambulation/Gait Ambulation/Gait assistance: Min guard Gait Distance (Feet): 200 Feet Assistive device: Rolling walker (2 wheels) Gait  Pattern/deviations: Step-through pattern, Decreased stride length Gait velocity: decr Gait velocity interpretation: <1.31 ft/sec, indicative of household ambulator   General Gait Details: Assist for safety   Stairs             Wheelchair Mobility    Modified Rankin (Stroke Patients Only)       Balance Overall balance assessment: Needs assistance Sitting-balance support: Feet supported Sitting balance-Leahy Scale: Fair     Standing balance support: Bilateral upper extremity supported Standing balance-Leahy Scale: Poor Standing balance comment: walker and min guard assist for static standing                            Cognition Arousal/Alertness: Awake/alert Behavior During Therapy: Impulsive Overall Cognitive Status: No family/caregiver present to determine baseline cognitive functioning Area of Impairment: Attention, Following commands, Problem solving, Orientation, Memory, Safety/judgement, Awareness                 Orientation Level: Disoriented to, Place, Time, Situation Current Attention Level: Sustained Memory: Decreased short-term memory, Decreased recall of precautions Following Commands: Follows one step commands consistently Safety/Judgement: Decreased awareness of safety, Decreased awareness of deficits Awareness: Intellectual Problem Solving: Requires verbal cues, Requires tactile cues, Difficulty sequencing, Slow processing          Exercises      General Comments        Pertinent Vitals/Pain Pain Assessment Pain Assessment: No/denies pain    Home Living                          Prior Function            PT Goals (current goals   can now be found in the care plan section) Acute Rehab PT Goals Patient Stated Goal: not stated PT Goal Formulation: Patient unable to participate in goal setting Time For Goal Achievement: 08/19/22 Potential to Achieve Goals: Good Progress towards PT goals: Goals met and updated  - see care plan    Frequency    Min 1X/week      PT Plan Current plan remains appropriate    Co-evaluation              AM-PAC PT "6 Clicks" Mobility   Outcome Measure  Help needed turning from your back to your side while in a flat bed without using bedrails?: A Little Help needed moving from lying on your back to sitting on the side of a flat bed without using bedrails?: A Little Help needed moving to and from a bed to a chair (including a wheelchair)?: A Little Help needed standing up from a chair using your arms (e.g., wheelchair or bedside chair)?: A Little Help needed to walk in hospital room?: A Little Help needed climbing 3-5 steps with a railing? : A Little 6 Click Score: 18    End of Session Equipment Utilized During Treatment: Gait belt Activity Tolerance: Patient tolerated treatment well Patient left: in chair;with call bell/phone within reach;with chair alarm set;with nursing/sitter in room Nurse Communication: Mobility status PT Visit Diagnosis: Other abnormalities of gait and mobility (R26.89);Muscle weakness (generalized) (M62.81);Difficulty in walking, not elsewhere classified (R26.2)     Time: 1158-1214 PT Time Calculation (min) (ACUTE ONLY): 16 min  Charges:  $Gait Training: 8-22 mins                     Audre Cenci PT Acute Rehabilitation Services Office [phone]    Lavell Ridings W [place] 08/05/2022, 12:31 PM   

## 2022-08-05 NOTE — Progress Notes (Addendum)
[facility] PROGRESS NOTE   Carla [name] WUJ:[id number] DOB: [birth year] DOA: 06/12/2022  PCP: Patient, [name]  Brief History/Interval Summary: 56 yof w/ COPD/emphysema tobacco use, Hypertension, Dementia, Anxiety, Depression, GERD, Primary hyperparathyroidism, Vit D deficiency, HLD, Osteoporosis, found by family unresponsive and brought to ER on 06/12/22 and found to have acute on chronic hypercapnia on ABG>Intubated in ER and treated for COPD exacerbation. She had prolonged complicated hospitalization charted as below with multiple ICU admissions, needing tracheostomy placement, having encephalopathy with baseline dementia and pulling out her PICC line tracheostomy.   Significant Events  3/20 Admitted after being found unresponsive, intubated on ED arrival  CT angio chest >> atherosclerosis, severe coronary calcification, severe LVH, mild centrilobular emphysema, 2 mm nodule LUL, 4 mm nodule LUL Blood culture  >> Haemophilus influenzae 3/22 Had oxygen desaturation and low Vt with SBT this morning. Echo 06/14/22 >> EF 60 to 65%, mod LVH, grade 1 DD, mild/mod AR 3/25 bronch for peak pressures, low tidal volumes with mucus cast at end of ETT, thick secretions caking tube 3/29 Bronched again with purulent secretions LLL with plugging and LLL atelectasis, some increase in O2 requirement on vent, zosyn started for VAP coverage. MRSA nare swab today negative.  4/2 family meeting, made DNR, plans for one way extubation 4/5 4/6-family changes mind regarding one-way extubation, plan will be for tracheostomy 4/8 trach placed 4/18 Cortrak clogged and thus replaced. Still working on weaning completed CTx for PNA. 48 hrs on ATC  4/19 off vent x 72 hours so removed from room.  Planning on changing trach to cuffless.  Continuing to work with PMV.  Hypoglycemia adding D5 to maintenance fluids 4/26: Patient pulled tracheostomy tube.  4/28: Patient pulled PICC line, PIV inserted, Soft  restraints.  Consultants: Pulmonology.  Psychiatry    Subjective/Interval History: Patient noted to be asleep this morning.  As per sitter patient was quite agitated overnight and fell asleep this little while ago.  Patient was not woken up.     Assessment/Plan:  Acute hypoxic/hypercapnic respiratory failure: Complicated hospital course w/ intubation, tracheostomy placement>she pulled out trach on 4/26.  Fortunately doing well without tracheostomy, site is healed.   Currently on room air. Cont Yupelri, Brovana Pulmicort  Nebs, and bronchodilator PRN. Will complete a 5-day course of doxycycline today.   Acute metabolic encephalopathy in the background of dementia Based on previous reports it looks like at baseline patient has regular conversations with herself and wanders out of the house.    Presented with unresponsiveness s/p extensive workup including  head CT negative for acute abnormality. Felt to be due to respiratory failure and also with ICU delirium.   Patient remains confused.  Patient was seen by psychiatry and started on Seroquel.  Dose of Seroquel was adjusted yesterday.  EKG showed normal QT interval.   Currently off of benzodiazepines.  Dysphagia Initially on core track>now tolerating DYS3 diet.    Multiple falls in the hospital  Found on the floor on 5/2  and 5/8.  Underwent imaging studies which did not show any injuries.   Type 2 diabetes mellitus  HbA1c 6.0.  Monitor CBGs.   Acute kidney injury Resolved.    Pulmonary nodules Incidental finding on CT 06/12/2022 - 2 mm and 4 mm noncalcified left upper lobe lung nodules.  She will need follow-up with CT scan in 12 months   Essential hypertension Well controlled on norvasc and Coreg. Basic metabolic panel from 5/12 showed mildly elevated creatinine.  Patient  encouraged to stay well-hydrated.  Will discuss with nursing staff.  Will recheck labs tomorrow.   Chronic diastolic CHF  Currently euvolemic    Hypokalemia Resolved   Physical deconditioning PT and OT has not seen her.  SNF is recommended.  Difficult to place.  Social worker is following.     Severe malnutrition  Encourage oral intake.  On Ensure.   DVT prophylaxis: Lovenox Code Status:   DNR Family Communication: No family at bedside Disposition: SNF.  Difficult to place.     Medications: Scheduled:  amLODipine  5 mg Oral Daily   arformoterol  15 mcg Nebulization BID   budesonide (PULMICORT) nebulizer solution  0.5 mg Nebulization BID   carvedilol  3.125 mg Oral BID WC   doxycycline  100 mg Oral Q12H   enoxaparin (LOVENOX) injection  30 mg Subcutaneous Daily   feeding supplement  237 mL Oral BID BM   mupirocin cream   Topical BID   nicotine  21 mg Transdermal Daily   nutrition supplement (JUVEN)  1 packet Oral BID BM   mouth rinse  15 mL Mouth Rinse 4 times per day   pantoprazole  40 mg Oral Daily   QUEtiapine  25 mg Oral BID   QUEtiapine  50 mg Oral QHS   revefenacin  175 mcg Nebulization Daily   Continuous:  sodium chloride 10 mL/hr at 07/01/22 0503   ZOX:[id number] **OR** acetaminophen, albuterol, haloperidol lactate, lip balm, ondansetron **OR** ondansetron (ZOFRAN) IV, mouth rinse, polyethylene glycol, senna  Antibiotics: Anti-infectives (From admission, onward)    Start     Dose/Rate Route Frequency Ordered Stop   07/31/22 1030  doxycycline (VIBRA-TABS) tablet 100 mg        100 mg Oral Every 12 hours 07/31/22 0932 08/05/22 0959   07/06/22 1000  cefTRIAXone (ROCEPHIN) 2 g in sodium chloride 0.9 % 100 mL IVPB        2 g 200 mL/hr over 30 Minutes Intravenous Every 24 hours 07/06/22 0848 07/08/22 0952   07/05/22 0200  piperacillin-tazobactam (ZOSYN) IVPB 3.375 g  Status:  Discontinued        3.375 g 12.5 mL/hr over 240 Minutes Intravenous Every 8 hours 07/04/22 1826 07/06/22 0848   07/04/22 1915  vancomycin (VANCOCIN) IVPB 1000 mg/200 mL premix  Status:  Discontinued        1,000 mg 200 mL/hr over  60 Minutes Intravenous Every 48 hours 07/04/22 1826 07/05/22 0827   07/04/22 1915  piperacillin-tazobactam (ZOSYN) IVPB 3.375 g        3.375 g 100 mL/hr over 30 Minutes Intravenous  Once 07/04/22 1826 07/04/22 1914   06/21/22 2300  piperacillin-tazobactam (ZOSYN) IVPB 3.375 g        3.375 g 12.5 mL/hr over 240 Minutes Intravenous Every 8 hours 06/21/22 1631 06/29/22 2009   06/21/22 1645  piperacillin-tazobactam (ZOSYN) IVPB 3.375 g  Status:  Discontinued        3.375 g 12.5 mL/hr over 240 Minutes Intravenous Every 8 hours 06/21/22 1630 06/21/22 1631   06/21/22 1645  piperacillin-tazobactam (ZOSYN) IVPB 3.375 g        3.375 g 100 mL/hr over 30 Minutes Intravenous STAT 06/21/22 1631 06/21/22 1900   06/14/22 1315  cefTRIAXone (ROCEPHIN) 2 g in sodium chloride 0.9 % 100 mL IVPB  Status:  Discontinued        2 g 200 mL/hr over 30 Minutes Intravenous Every 24 hours 06/14/22 1249 06/20/22 1415   06/12/22 2230  doxycycline (VIBRAMYCIN) 100 mg  in sodium chloride 0.9 % 250 mL IVPB  Status:  Discontinued        100 mg 125 mL/hr over 120 Minutes Intravenous Every 12 hours 06/12/22 2139 06/14/22 1350       Objective:  Vital Signs  Vitals:   08/04/22 1759 08/05/22 0635 08/05/22 0731 08/05/22 0805  BP: (!) 139/103 (!) 145/74  128/67  Pulse: (!) 112 86    Resp: 20 20    Temp: (!) 97.5 F (36.4 C) 97.6 F (36.4 C)    TempSrc: Axillary Oral    SpO2: 91% 94% 95%   Weight:      Height:       No intake or output data in the 24 hours ending 08/05/22 0912  Filed Weights   08/01/22 0342 08/02/22 0500 08/04/22 1343  Weight: 37.4 kg 37.4 kg 39.6 kg   Patient was fast asleep.  She was not woken up.   Lab Results:  Data Reviewed: I have personally reviewed following labs and reports of the imaging studies  CBC: Recent Labs  Lab 08/04/22 0249  WBC 9.9  HGB 14.6  HCT 45.4  MCV 94.8  PLT 349     Basic Metabolic Panel: Recent Labs  Lab 08/04/22 0249  NA 134*  K 4.4  CL 103   CO2 21*  GLUCOSE 169*  BUN 28*  CREATININE 1.07*  CALCIUM 10.1     GFR: Estimated Creatinine Clearance: 27.5 mL/min (A) (by C-G formula based on SCr of 1.07 mg/dL (H)).   Radiology Studies: No results found.     LOS: 54 days   Tyrica Afzal [employer] on [email or web address]  08/05/2022, 9:12 AM

## 2022-08-05 NOTE — TOC Progression Note (Incomplete Revision)
Transition of Care [facility]) - Progression Note    Patient Details  Name: Carla [name] MRN: [id number] Date of Birth: January 30, 1946  [facility] CM/SW Contact  Delilah Shan, LCSWA Phone Number: 08/05/2022, 12:27 PM  Clinical Narrative:     CSW followed up with patients Granddaughter on patients dc plan. Patients Granddaughter informed CSW she is interested in [facility] in [place]. Patients Granddaughter gave CSW permission to fax out referral for patient. Patients Granddaughter understands there will be an out of pocket cost for memory care. CSW awaiting to hear back from financial counseling on screening for medicaid. CSW spoke with Francee Piccolo at [facility] who provided CSW with fax number to send referral over. CSW faxed over referral for review. CSW will follow up on referral. Patients Grandaughter informed CSW she plans on viewing the facility on Saturday. CSW informed MD.CSW will continue to follow and assist with patients dc planning needs.  Expected Discharge Plan: Skilled Nursing Facility Barriers to Discharge: Continued Medical Work up  Expected Discharge Plan and Services   Discharge Planning Services: CM Consult Post Acute Care Choice: Long Term Acute Care (LTAC)                                         Social Determinants of Health (SDOH) Interventions SDOH Screenings   Food Insecurity: No Food Insecurity (07/28/2022)  Housing: Low Risk  (07/28/2022)  Transportation Needs: No Transportation Needs (07/28/2022)  Utilities: Not At Risk (07/28/2022)  Tobacco Use: High Risk (07/28/2022)    Readmission Risk Interventions    07/22/2022    1:43 PM  Readmission Risk Prevention Plan  Transportation Screening Complete  Palliative Care Screening Complete

## 2022-08-05 NOTE — TOC Progression Note (Addendum)
Transition of Care [facility]) - Progression Note    Patient Details  Name: Carla [name] MRN: [id number] Date of Birth: January 30, 1946  Transition of Care [facility]) CM/SW Contact  Delilah Shan, LCSWA Phone Number: 08/05/2022, 12:27 PM  Clinical Narrative:     CSW followed up with patients Granddaughter on patients dc plan. Patients Granddaughter informed CSW she is interested in [facility] in [place]. Patients Granddaughter gave CSW permission to fax out referral for patient. Patients Granddaughter understands there will be an out of pocket cost for memory care. CSW awaiting to hear back from financial counseling on screening for medicaid. CSW spoke with Francee Piccolo at [facility] who provided CSW with fax number to send referral over. CSW faxed over referral for review. CSW will follow up on referral. Patients Grandaughter informed CSW she plans on viewing the facility on Saturday. CSW informed MD.CSW will continue to follow and assist with patients dc planning needs.  Expected Discharge Plan: Skilled Nursing Facility Barriers to Discharge: Continued Medical Work up  Expected Discharge Plan and Services   Discharge Planning Services: CM Consult Post Acute Care Choice: Long Term Acute Care (LTAC)                                         Social Determinants of Health (SDOH) Interventions SDOH Screenings   Food Insecurity: No Food Insecurity (07/28/2022)  Housing: Low Risk  (07/28/2022)  Transportation Needs: No Transportation Needs (07/28/2022)  Utilities: Not At Risk (07/28/2022)  Tobacco Use: High Risk (07/28/2022)    Readmission Risk Interventions    07/22/2022    1:43 PM  Readmission Risk Prevention Plan  Transportation Screening Complete  Palliative Care Screening Complete

## 2022-08-06 DIAGNOSIS — G9341 Metabolic encephalopathy: Secondary | ICD-10-CM | POA: Diagnosis not present

## 2022-08-06 LAB — BASIC METABOLIC PANEL
Calcium: 9.6 mg/dL (ref 8.9–10.3)
Chloride: 104 mmol/L (ref 98–111)
Creatinine, Ser: 0.87 mg/dL (ref 0.44–1.00)
GFR, Estimated: >60 mL/min (ref >60)
Glucose, Bld: 111 mg/dL — ABNORMAL HIGH (ref 70–99)
Potassium: 3.8 mmol/L (ref 3.5–5.1)
Sodium: 137 mmol/L (ref 135–145)

## 2022-08-06 NOTE — Progress Notes (Addendum)
2000 patient alert to self asked repeated question even after answering them patient has sitter very impulsive unaware of dangers to self. Patient will not keep foam bandages on skin she's pulls everything off education to patient unsuccessful.  08/07/22 0000 patient confused roaming in hall with staff not redirectable, sitter with patient contact guard assist so patient will not fall  0150 patient trying to go into other patients rooms hard to redirect sitter with patient 0400 patient trying to push her way pout of doors trying to leave unit lots of redirection needed patient asking for a drink and a smoke sitter with patient ensuring no falls happen contact guard assistance needed. Patient placed in chair and pushed back to room when patient to weak to walk back to room orientated to self only

## 2022-08-06 NOTE — TOC Progression Note (Addendum)
Transition of Care [facility]) - Progression Note    Patient Details  Name: Carla [name] MRN: [id number] Date of Birth: [birth year]  Transition of Care [facility]) CM/SW Contact  Mearl Latin, LCSW Phone Number: 08/06/2022, 1:45 PM  Clinical Narrative:    CSW contacted [facility] in [place] and left request for Francee Piccolo to contact CSW back about referral.   Call returned by Francee Piccolo and he reported he received referral and is reviewing. CSW provided him with patient's granddaughter's contact info so he can call and discuss financials with her.     Expected Discharge Plan: Skilled Nursing Facility Barriers to Discharge: Continued Medical Work up  Expected Discharge Plan and Services   Discharge Planning Services: CM Consult Post Acute Care Choice: Long Term Acute Care (LTAC)                                         Social Determinants of Health (SDOH) Interventions SDOH Screenings   Food Insecurity: No Food Insecurity (07/28/2022)  Housing: Low Risk  (07/28/2022)  Transportation Needs: No Transportation Needs (07/28/2022)  Utilities: Not At Risk (07/28/2022)  Tobacco Use: High Risk (07/28/2022)    Readmission Risk Interventions    07/22/2022    1:43 PM  Readmission Risk Prevention Plan  Transportation Screening Complete  Palliative Care Screening Complete

## 2022-08-06 NOTE — Progress Notes (Addendum)
[facility] PROGRESS NOTE   Carla [name]:[id number] DOB: 1945/11/25 DOA: 06/12/2022  PCP: Patient, [name]  Brief History/Interval Summary: 30 yof w/ COPD/emphysema tobacco use, Hypertension, Dementia, Anxiety, Depression, GERD, Primary hyperparathyroidism, Vit D deficiency, HLD, Osteoporosis, found by family unresponsive and brought to ER on 06/12/22 and found to have acute on chronic hypercapnia on ABG>Intubated in ER and treated for COPD exacerbation. She had prolonged complicated hospitalization charted as below with multiple ICU admissions, needing tracheostomy placement, having encephalopathy with baseline dementia and pulling out her PICC line tracheostomy.   Significant Events  3/20 Admitted after being found unresponsive, intubated on ED arrival  CT angio chest >> atherosclerosis, severe coronary calcification, severe LVH, mild centrilobular emphysema, 2 mm nodule LUL, 4 mm nodule LUL Blood culture  >> Haemophilus influenzae 3/22 Had oxygen desaturation and low Vt with SBT this morning. Echo 06/14/22 >> EF 60 to 65%, mod LVH, grade 1 DD, mild/mod AR 3/25 bronch for peak pressures, low tidal volumes with mucus cast at end of ETT, thick secretions caking tube 3/29 Bronched again with purulent secretions LLL with plugging and LLL atelectasis, some increase in O2 requirement on vent, zosyn started for VAP coverage. MRSA nare swab today negative.  4/2 family meeting, made DNR, plans for one way extubation 4/5 4/6-family changes mind regarding one-way extubation, plan will be for tracheostomy 4/8 trach placed 4/18 Cortrak clogged and thus replaced. Still working on weaning completed CTx for PNA. 48 hrs on ATC  4/19 off vent x 72 hours so removed from room.  Planning on changing trach to cuffless.  Continuing to work with PMV.  Hypoglycemia adding D5 to maintenance fluids 4/26: Patient pulled tracheostomy tube.  4/28: Patient pulled PICC line, PIV inserted, Soft  restraints.  Consultants: Pulmonology.  Psychiatry    Subjective/Interval History: Patient getting a nebulizer treatment this morning.  Appears to be calm.  Denies any complaints.     Assessment/Plan:  Acute hypoxic/hypercapnic respiratory failure: Complicated hospital course w/ intubation, tracheostomy placement>she pulled out trach on 4/26.  Fortunately doing well without tracheostomy, site is healed.   Currently on room air. Cont Yupelri, Brovana Pulmicort  Nebs, and bronchodilator PRN. Should be able to switch her over to inhaler treatment at discharge with [facility] and [facility]. She has completed a 5-day course of doxycycline.   Acute metabolic encephalopathy in the background of dementia Based on previous reports it looks like at baseline patient has regular conversations with herself and wanders out of the house.    Presented with unresponsiveness s/p extensive workup including  head CT negative for acute abnormality. Felt to be due to respiratory failure and also with ICU delirium.   Patient remains confused.  Patient was seen by psychiatry and started on Seroquel.  Dose of Seroquel was adjusted on 5/12.  EKG showed normal QT interval.   Currently off of benzodiazepines. Patient seems to be stable.  Continue to monitor.  Dysphagia Initially on core track>now tolerating DYS3 diet.    Multiple falls in the hospital  Found on the floor on 5/2  and 5/8.  Underwent imaging studies which did not show any injuries.   Type 2 diabetes mellitus  HbA1c 6.0.  Monitor CBGs.   Acute kidney injury Resolved.    Pulmonary nodules Incidental finding on CT 06/12/2022 - 2 mm and 4 mm noncalcified left upper lobe lung nodules.  May need outpatient surveillance depending on her prognosis and goals of care going forward.   Essential hypertension  Well controlled on norvasc and Coreg. Basic metabolic panel from 5/12 showed mildly elevated creatinine.  Patient encouraged to stay well-hydrated.   Labs from this morning show improvement in creatinine.   Chronic diastolic CHF  Currently euvolemic   Hypokalemia Resolved   Physical deconditioning PT and OT has not seen her.  SNF is recommended.  Difficult to place.  Social worker is following.  Family looking into assisted living facilities/memory care unit   Severe malnutrition  Encourage oral intake.  On Ensure.   DVT prophylaxis: Lovenox Code Status:   DNR Family Communication: No family at bedside Disposition: SNF.  Difficult to place.     Medications: Scheduled:  amLODipine  5 mg Oral Daily   arformoterol  15 mcg Nebulization BID   budesonide (PULMICORT) nebulizer solution  0.5 mg Nebulization BID   carvedilol  3.125 mg Oral BID WC   enoxaparin (LOVENOX) injection  30 mg Subcutaneous Daily   feeding supplement  237 mL Oral BID BM   mupirocin cream   Topical BID   nicotine  21 mg Transdermal Daily   nutrition supplement (JUVEN)  1 packet Oral BID BM   mouth rinse  15 mL Mouth Rinse 4 times per day   pantoprazole  40 mg Oral Daily   QUEtiapine  25 mg Oral BID   QUEtiapine  50 mg Oral QHS   revefenacin  175 mcg Nebulization Daily   Continuous:   MVH:[id number] **OR** acetaminophen, albuterol, haloperidol lactate, lip balm, ondansetron **OR** ondansetron (ZOFRAN) IV, mouth rinse, polyethylene glycol, senna  Antibiotics: Anti-infectives (From admission, onward)    Start     Dose/Rate Route Frequency Ordered Stop   07/31/22 1030  doxycycline (VIBRA-TABS) tablet 100 mg        100 mg Oral Every 12 hours 07/31/22 0932 08/05/22 0959   07/06/22 1000  cefTRIAXone (ROCEPHIN) 2 g in sodium chloride 0.9 % 100 mL IVPB        2 g 200 mL/hr over 30 Minutes Intravenous Every 24 hours 07/06/22 0848 07/08/22 0952   07/05/22 0200  piperacillin-tazobactam (ZOSYN) IVPB 3.375 g  Status:  Discontinued        3.375 g 12.5 mL/hr over 240 Minutes Intravenous Every 8 hours 07/04/22 1826 07/06/22 0848   07/04/22 1915  vancomycin  (VANCOCIN) IVPB 1000 mg/200 mL premix  Status:  Discontinued        1,000 mg 200 mL/hr over 60 Minutes Intravenous Every 48 hours 07/04/22 1826 07/05/22 0827   07/04/22 1915  piperacillin-tazobactam (ZOSYN) IVPB 3.375 g        3.375 g 100 mL/hr over 30 Minutes Intravenous  Once 07/04/22 1826 07/04/22 1914   06/21/22 2300  piperacillin-tazobactam (ZOSYN) IVPB 3.375 g        3.375 g 12.5 mL/hr over 240 Minutes Intravenous Every 8 hours 06/21/22 1631 06/29/22 2009   06/21/22 1645  piperacillin-tazobactam (ZOSYN) IVPB 3.375 g  Status:  Discontinued        3.375 g 12.5 mL/hr over 240 Minutes Intravenous Every 8 hours 06/21/22 1630 06/21/22 1631   06/21/22 1645  piperacillin-tazobactam (ZOSYN) IVPB 3.375 g        3.375 g 100 mL/hr over 30 Minutes Intravenous STAT 06/21/22 1631 06/21/22 1900   06/14/22 1315  cefTRIAXone (ROCEPHIN) 2 g in sodium chloride 0.9 % 100 mL IVPB  Status:  Discontinued        2 g 200 mL/hr over 30 Minutes Intravenous Every 24 hours 06/14/22 1249 06/20/22 1415   06/12/22  2230  doxycycline (VIBRAMYCIN) 100 mg in sodium chloride 0.9 % 250 mL IVPB  Status:  Discontinued        100 mg 125 mL/hr over 120 Minutes Intravenous Every 12 hours 06/12/22 2139 06/14/22 1350       Objective:  Vital Signs  Vitals:   08/05/22 0950 08/05/22 1327 08/06/22 0541 08/06/22 0733  BP:  (!) 124/56 (!) 143/73   Pulse:  91 76   Resp:  19 19   Temp:      TempSrc:      SpO2: 92% 92% 92% 91%  Weight:   40.2 kg   Height:        Intake/Output Summary (Last 24 hours) at 08/06/2022 0947 Last data filed at 08/05/2022 1525 Gross per 24 hour  Intake 861 ml  Output --  Net 861 ml    Filed Weights   08/02/22 0500 08/04/22 1343 08/06/22 0541  Weight: 37.4 kg 39.6 kg 40.2 kg   General appearance: Awake alert.  In no distress Resp: Clear to auscultation bilaterally.  Normal effort Cardio: S1-S2 is normal regular.  No S3-S4.  No rubs murmurs or bruit GI: Abdomen is soft.  Nontender  nondistended.  Bowel sounds are present normal.  No masses organomegaly   Lab Results:  Data Reviewed: I have personally reviewed following labs and reports of the imaging studies  CBC: Recent Labs  Lab 08/04/22 0249  WBC 9.9  HGB 14.6  HCT 45.4  MCV 94.8  PLT 349     Basic Metabolic Panel: Recent Labs  Lab 08/04/22 0249 08/06/22 0235  NA 134* 137  K 4.4 3.8  CL 103 104  CO2 21* 23  GLUCOSE 169* 111*  BUN 28* 35*  CREATININE 1.07* 0.87  CALCIUM 10.1 9.6     GFR: Estimated Creatinine Clearance: 34.4 mL/min (by C-G formula based on SCr of 0.87 mg/dL).   Radiology Studies: No results found.     LOS: 55 days   Roshan Salamon [employer] on [email or web address]  08/06/2022, 9:47 AM

## 2022-08-07 DIAGNOSIS — J9601 Acute respiratory failure with hypoxia: Secondary | ICD-10-CM | POA: Diagnosis not present

## 2022-08-07 DIAGNOSIS — J9602 Acute respiratory failure with hypercapnia: Secondary | ICD-10-CM | POA: Diagnosis not present

## 2022-08-07 MED ORDER — QUETIAPINE FUMARATE 25 MG PO TABS
75.0000 mg | ORAL_TABLET | Freq: Every day | ORAL | Status: DC
  Administered 2022-08-07 – 2022-08-28 (×17): 75 mg via ORAL
  Filled 2022-08-07 (×22): qty 3

## 2022-08-07 NOTE — Progress Notes (Addendum)
PROGRESS NOTE    Carla [name]  ZOX:[id number] DOB: 09/13/1945 DOA: 06/12/2022 PCP: Patient, [name]  Brief Narrative: 63 yof w/ COPD/emphysema tobacco use, Hypertension, Dementia, Anxiety, Depression, GERD, Primary hyperparathyroidism, Vit D deficiency, HLD, Osteoporosis, found by family unresponsive and brought to ER on 06/12/22 and found to have acute on chronic hypercapnia on ABG>Intubated in ER and treated for COPD exacerbation. She had prolonged complicated hospitalization charted as below with multiple ICU admissions, needing tracheostomy placement, having encephalopathy with baseline dementia and pulling out her PICC line tracheostomy.   Significant Events  3/20 Admitted after being found unresponsive, intubated on ED arrival  CT angio chest >> atherosclerosis, severe coronary calcification, severe LVH, mild centrilobular emphysema, 2 mm nodule LUL, 4 mm nodule LUL Blood culture  >> Haemophilus influenzae 3/22 Had oxygen desaturation and low Vt with SBT this morning. Echo 06/14/22 >> EF 60 to 65%, mod LVH, grade 1 DD, mild/mod AR 3/25 bronch for peak pressures, low tidal volumes with mucus cast at end of ETT, thick secretions caking tube 3/29 Bronched again with purulent secretions LLL with plugging and LLL atelectasis, some increase in O2 requirement on vent, zosyn started for VAP coverage. MRSA nare swab today negative.  4/2 family meeting, made DNR, plans for one way extubation 4/5 4/6-family changes mind regarding one-way extubation, plan will be for tracheostomy 4/8 trach placed 4/18 Cortrak clogged and thus replaced. Still working on weaning completed CTx for PNA. 48 hrs on ATC  4/19 off vent x 72 hours so removed from room.  Planning on changing trach to cuffless.  Continuing to work with PMV.  Hypoglycemia adding D5 to maintenance fluids 4/26: Patient pulled tracheostomy tube.  4/28: Patient pulled PICC line, PIV inserted, Soft restraints.  Assessment & Plan:   Principal  Problem:   Acute respiratory failure with hypoxia and hypercapnia (HCC) Active Problems:   COPD with exacerbation (HCC)   Hyperglycemia   Acute metabolic encephalopathy   HTN (hypertension)   Tobacco abuse   Anxiety   Peripheral vascular disease (HCC)   Pulmonary nodules   Dementia (HCC)   Protein-calorie malnutrition, severe (HCC)   Urinary retention   Hypervolemia   Encephalopathy acute   Tracheostomy in place [facility])   AKI (acute kidney injury) (HCC)   Hypernatremia   Fever   Acute respiratory failure with hypercapnia (HCC)   Hyponatremia   Pressure injury of skin   Acute hypoxic/hypercapnic respiratory failure: Complicated hospital course w/ intubation, tracheostomy placement>she pulled out trach on 4/26.  Fortunately doing well without tracheostomy, site is healed.   Currently on room air. Cont Yupelri, Brovana Pulmicort  Nebs, and bronchodilator PRN. Should be able to switch her over to inhaler treatment at discharge with [facility] and [facility]. She has completed a 5-day course of doxycycline.   Acute metabolic encephalopathy in the background of dementia Based on previous reports it looks like at baseline patient has regular conversations with herself and wanders out of the house.    Presented with unresponsiveness s/p extensive workup including  head CT negative for acute abnormality. Felt to be due to respiratory failure and also with ICU delirium.   Patient remains confused.  Patient was seen by psychiatry and started on Seroquel.   Dose of Seroquel increased to 75 mg qhs 5/15 Currently off of benzodiazepines. Patient seems to be stable.  Continue to monitor.   Dysphagia Initially on core track>now tolerating DYS3 diet.    Multiple falls in the hospital  Found on the  floor on 5/2  and 5/8.  Underwent imaging studies which did not show any injuries.   Type 2 diabetes mellitus  HbA1c 6.0.  Monitor CBGs.   Acute kidney injury Resolved.    Pulmonary  nodules Incidental finding on CT 06/12/2022 - 2 mm and 4 mm noncalcified left upper lobe lung nodules.  May need outpatient surveillance depending on her prognosis and goals of care going forward.   Essential hypertension Well controlled on norvasc and Coreg. Basic metabolic panel from 5/12 showed mildly elevated creatinine.  Patient encouraged to stay well-hydrated.  Labs from this morning show improvement in creatinine.   Chronic diastolic CHF  Currently euvolemic   Hypokalemia Resolved    Physical deconditioning PT and OT has not seen her.  SNF is recommended.  Difficult to place.  Social worker is following.  Family looking into assisted living facilities/memory care unit   Severe malnutrition  Encourage oral intake.  On Ensure.   Nutrition Problem: Severe Malnutrition Etiology: chronic illness (COPD, dementia)  Signs/Symptoms: severe fat depletion, severe muscle depletion  Interventions: Ensure Enlive (each supplement provides 350kcal and 20 grams of protein), Juven  Estimated body mass index is 17.27 kg/m as calculated from the following:   Height as of this encounter: 5' (1.524 m).   Weight as of this encounter: 40.1 kg.  DVT prophylaxis: lovenox Code Status:dnr Family Communication: none Disposition Plan:  Status is: Inpatient Remains inpatient appropriate because: medically stable for dc   Consultants:  Pulm psych  Subjective: Had a rough nite just went to sleep this morning  Objective: Vitals:   08/06/22 1521 08/06/22 2027 08/07/22 0423 08/07/22 1421  BP: 128/71 (!) 151/70 (!) 164/87 125/64  Pulse: 88 99 (!) 116 88  Resp:  18 17 16   Temp:  97.7 F (36.5 C)  97.8 F (36.6 C)  TempSrc: Oral Oral  Axillary  SpO2:  95%  93%  Weight:   40.1 kg   Height:        Intake/Output Summary (Last 24 hours) at 08/07/2022 1520 Last data filed at 08/07/2022 1512 Gross per 24 hour  Intake 877 ml  Output 515 ml  Net 362 ml   Filed Weights   08/04/22 1343  08/06/22 0541 08/07/22 0423  Weight: 39.6 kg 40.2 kg 40.1 kg    Examination:  General exam: Appears calm and comfortable  Respiratory system: Clear to auscultation. Respiratory effort normal. Cardiovascular system: S1 & S2 heard, RRR. No JVD, murmurs, rubs, gallops or clicks. No pedal edema. Gastrointestinal system: Abdomen is nondistended, soft and nontender. No organomegaly or masses felt. Normal bowel sounds heard. Central nervous system: Alert and oriented. No focal neurological deficits. Extremities: Symmetric 5 x 5 power. Skin: No rashes, lesions or ulcers Psychiatry: Judgement and insight appear normal. Mood & affect appropriate.     Data Reviewed: I have personally reviewed following labs and imaging studies  CBC: Recent Labs  Lab 08/04/22 0249  WBC 9.9  HGB 14.6  HCT 45.4  MCV 94.8  PLT 349   Basic Metabolic Panel: Recent Labs  Lab 08/04/22 0249 08/06/22 0235  NA 134* 137  K 4.4 3.8  CL 103 104  CO2 21* 23  GLUCOSE 169* 111*  BUN 28* 35*  CREATININE 1.07* 0.87  CALCIUM 10.1 9.6   GFR: Estimated Creatinine Clearance: 34.3 mL/min (by C-G formula based on SCr of 0.87 mg/dL). Liver Function Tests: No results for input(s): "AST", "ALT", "ALKPHOS", "BILITOT", "PROT", "ALBUMIN" in the last 168 hours. No results  for input(s): "LIPASE", "AMYLASE" in the last 168 hours. No results for input(s): "AMMONIA" in the last 168 hours. Coagulation Profile: No results for input(s): "INR", "PROTIME" in the last 168 hours. Cardiac Enzymes: No results for input(s): "CKTOTAL", "CKMB", "CKMBINDEX", "TROPONINI" in the last 168 hours. BNP (last 3 results) No results for input(s): "PROBNP" in the last 8760 hours. HbA1C: No results for input(s): "HGBA1C" in the last 72 hours. CBG: No results for input(s): "GLUCAP" in the last 168 hours. Lipid Profile: No results for input(s): "CHOL", "HDL", "LDLCALC", "TRIG", "CHOLHDL", "LDLDIRECT" in the last 72 hours. Thyroid Function  Tests: No results for input(s): "TSH", "T4TOTAL", "FREET4", "T3FREE", "THYROIDAB" in the last 72 hours. Anemia Panel: No results for input(s): "VITAMINB12", "FOLATE", "FERRITIN", "TIBC", "IRON", "RETICCTPCT" in the last 72 hours. Sepsis Labs: No results for input(s): "PROCALCITON", "LATICACIDVEN" in the last 168 hours.  No results found for this or any previous visit (from the past 240 hour(s)).       Radiology Studies: No results found.      Scheduled Meds:  amLODipine  5 mg Oral Daily   arformoterol  15 mcg Nebulization BID   budesonide (PULMICORT) nebulizer solution  0.5 mg Nebulization BID   carvedilol  3.125 mg Oral BID WC   enoxaparin (LOVENOX) injection  30 mg Subcutaneous Daily   feeding supplement  237 mL Oral BID BM   mupirocin cream   Topical BID   nicotine  21 mg Transdermal Daily   nutrition supplement (JUVEN)  1 packet Oral BID BM   mouth rinse  15 mL Mouth Rinse 4 times per day   pantoprazole  40 mg Oral Daily   QUEtiapine  25 mg Oral BID   QUEtiapine  50 mg Oral QHS   revefenacin  175 mcg Nebulization Daily   Continuous Infusions:   LOS: 56 days    Time spent: 30 min  Alwyn Ren, MD 08/07/2022, 3:20 PM

## 2022-08-07 NOTE — TOC Progression Note (Incomplete Revision)
Transition of Care [facility]) - Progression Note    Patient Details  Name: Carla [name] MRN: [id number] Date of Birth: Nov 18, 1945  Transition of Care [facility]) CM/SW Contact  Delilah Shan, LCSWA Phone Number: 08/07/2022, 12:04 PM  Clinical Narrative:     CSW spoke with Francee Piccolo with [facility]. Francee Piccolo with [facility] tele# [phone] .Derek informed CSW that he has not heard back from patients Grandaughter yet. He is going to try and reach back out again and review patients referral. Francee Piccolo informed CSW he will reach back out  to CSW by 3:00pm today. CSW spoke with Mikle Bosworth with Financial counseling who informed CSW that he will reach out to patients Granddaughter today in regards to medicaid screening for patient. CSW will continue to follow.  Update-3:55pm-Derek with Memory Care- LVM with patients  Granddaughter to discuss next steps for patient.Derek informed CSW he will discuss Financials with her,if Granddaughter agrees to proceed they will then complete a virtual assessment,If facility  can meet patients needs they then contact family to complete  admission paperwork. CSW also tried to call patients Granddaughter and LVM.  CSW will continue to follow.   Expected Discharge Plan: Skilled Nursing Facility Barriers to Discharge: Continued Medical Work up  Expected Discharge Plan and Services   Discharge Planning Services: CM Consult Post Acute Care Choice: Long Term Acute Care (LTAC)                                         Social Determinants of Health (SDOH) Interventions SDOH Screenings   Food Insecurity: No Food Insecurity (07/28/2022)  Housing: Low Risk  (07/28/2022)  Transportation Needs: No Transportation Needs (07/28/2022)  Utilities: Not At Risk (07/28/2022)  Tobacco Use: High Risk (07/28/2022)    Readmission Risk Interventions    07/22/2022    1:43 PM  Readmission Risk Prevention Plan  Transportation Screening Complete   Palliative Care Screening Complete

## 2022-08-07 NOTE — TOC Progression Note (Addendum)
Transition of Care [facility]) - Progression Note    Patient Details  Name: Carla [name] MRN: [id number] Date of Birth: Nov 18, 1945  Transition of Care [facility]) CM/SW Contact  Delilah Shan, LCSWA Phone Number: 08/07/2022, 12:04 PM  Clinical Narrative:     CSW spoke with Francee Piccolo with [facility]. Francee Piccolo with [facility] tele# [phone] .Derek informed CSW that he has not heard back from patients Grandaughter yet. He is going to try and reach back out again and review patients referral. Francee Piccolo informed CSW he will reach back out  to CSW by 3:00pm today. CSW spoke with Mikle Bosworth with Financial counseling who informed CSW that he will reach out to patients Granddaughter today in regards to medicaid screening for patient. CSW will continue to follow.  Update-3:55pm-Derek with Memory Care- LVM with patients  Granddaughter to discuss next steps for patient.Derek informed CSW he will discuss Financials with her,if Granddaughter agrees to proceed they will then complete a virtual assessment,If facility  can meet patients needs they then contact family to complete  admission paperwork. CSW also tried to call patients Granddaughter and LVM.  CSW will continue to follow.   Expected Discharge Plan: Skilled Nursing Facility Barriers to Discharge: Continued Medical Work up  Expected Discharge Plan and Services   Discharge Planning Services: CM Consult Post Acute Care Choice: Long Term Acute Care (LTAC)                                         Social Determinants of Health (SDOH) Interventions SDOH Screenings   Food Insecurity: No Food Insecurity (07/28/2022)  Housing: Low Risk  (07/28/2022)  Transportation Needs: No Transportation Needs (07/28/2022)  Utilities: Not At Risk (07/28/2022)  Tobacco Use: High Risk (07/28/2022)    Readmission Risk Interventions    07/22/2022    1:43 PM  Readmission Risk Prevention Plan  Transportation Screening Complete   Palliative Care Screening Complete

## 2022-08-08 NOTE — Progress Notes (Addendum)
PROGRESS NOTE    PRECIOSA OHR  ZOX:[id number] DOB: 1945-12-06 DOA: 06/12/2022 PCP: Patient, [name]  Brief Narrative: 63 yof w/ COPD/emphysema tobacco use, Hypertension, Dementia, Anxiety, Depression, GERD, Primary hyperparathyroidism, Vit D deficiency, HLD, Osteoporosis, found by family unresponsive and brought to ER on 06/12/22 and found to have acute on chronic hypercapnia on ABG>Intubated in ER and treated for COPD exacerbation. She had prolonged complicated hospitalization charted as below with multiple ICU admissions, needing tracheostomy placement, having encephalopathy with baseline dementia and pulling out her PICC line tracheostomy.   Significant Events  3/20 Admitted after being found unresponsive, intubated on ED arrival  CT angio chest >> atherosclerosis, severe coronary calcification, severe LVH, mild centrilobular emphysema, 2 mm nodule LUL, 4 mm nodule LUL Blood culture  >> Haemophilus influenzae 3/22 Had oxygen desaturation and low Vt with SBT this morning. Echo 06/14/22 >> EF 60 to 65%, mod LVH, grade 1 DD, mild/mod AR 3/25 bronch for peak pressures, low tidal volumes with mucus cast at end of ETT, thick secretions caking tube 3/29 Bronched again with purulent secretions LLL with plugging and LLL atelectasis, some increase in O2 requirement on vent, zosyn started for VAP coverage. MRSA nare swab today negative.  4/2 family meeting, made DNR, plans for one way extubation 4/5 4/6-family changes mind regarding one-way extubation, plan will be for tracheostomy 4/8 trach placed 4/18 Cortrak clogged and thus replaced. Still working on weaning completed CTx for PNA. 48 hrs on ATC  4/19 off vent x 72 hours so removed from room.  Planning on changing trach to cuffless.  Continuing to work with PMV.  Hypoglycemia adding D5 to maintenance fluids 4/26: Patient pulled tracheostomy tube.  4/28: Patient pulled PICC line, PIV inserted, Soft restraints.  Assessment & Plan:   Principal  Problem:   Acute respiratory failure with hypoxia and hypercapnia (HCC) Active Problems:   COPD with exacerbation (HCC)   Hyperglycemia   Acute metabolic encephalopathy   HTN (hypertension)   Tobacco abuse   Anxiety   Peripheral vascular disease (HCC)   Pulmonary nodules   Dementia (HCC)   Protein-calorie malnutrition, severe (HCC)   Urinary retention   Hypervolemia   Encephalopathy acute   Tracheostomy in place [name])   AKI (acute kidney injury) (HCC)   Hypernatremia   Fever   Acute respiratory failure with hypercapnia (HCC)   Hyponatremia   Pressure injury of skin   Acute hypoxic/hypercapnic respiratory failure: Complicated hospital course w/ intubation, tracheostomy placement>she pulled out trach on 4/26.  Fortunately doing well without tracheostomy, site is healed.   Currently on room air. Cont Yupelri, Brovana Pulmicort  Nebs, and bronchodilator PRN. Should be able to switch her over to inhaler treatment at discharge with [facility] and [facility]. She has completed a 5-day course of doxycycline.   Acute metabolic encephalopathy in the background of dementia Based on previous reports it looks like at baseline patient has regular conversations with herself and wanders out of the house.    Presented with unresponsiveness s/p extensive workup including  head CT negative for acute abnormality. Felt to be due to respiratory failure and also with ICU delirium.   Patient remains confused.  Patient was seen by psychiatry and started on Seroquel.   Dose of Seroquel increased to 75 mg qhs 5/15 Currently off of benzodiazepines. Patient seems to be stable.  Continue to monitor.   Dysphagia Initially on core track>now tolerating DYS3 diet.    Multiple falls in the hospital  Found on the  floor on 5/2  and 5/8.  Underwent imaging studies which did not show any injuries.   Type 2 diabetes mellitus  HbA1c 6.0.  Monitor CBGs.   Acute kidney injury Resolved.    Pulmonary  nodules Incidental finding on CT 06/12/2022 - 2 mm and 4 mm noncalcified left upper lobe lung nodules.  May need outpatient surveillance depending on her prognosis and goals of care going forward.   Essential hypertension Well controlled on norvasc and Coreg. Basic metabolic panel from 5/12 showed mildly elevated creatinine.  Patient encouraged to stay well-hydrated.  Labs from this morning show improvement in creatinine.   Chronic diastolic CHF  Currently euvolemic   Hypokalemia Resolved    Physical deconditioning PT and OT has not seen her.  SNF is recommended.  Difficult to place.  Social worker is following.  Family looking into assisted living facilities/memory care unit   Severe malnutrition  Encourage oral intake.  On Ensure.   Nutrition Problem: Severe Malnutrition Etiology: chronic illness (COPD, dementia)  Signs/Symptoms: severe fat depletion, severe muscle depletion  Interventions: Ensure Enlive (each supplement provides 350kcal and 20 grams of protein), Juven  Estimated body mass index is 17.27 kg/m as calculated from the following:   Height as of this encounter: 5' (1.524 m).   Weight as of this encounter: 40.1 kg.  DVT prophylaxis: lovenox Code Status:dnr Family Communication: none Disposition Plan:  Status is: Inpatient Remains inpatient appropriate because: medically stable for dc   Consultants:  Pulm psych  Subjective:  No events reported by the staff overnight when I saw her she was sitting up by the side of the bed and trying to eat breakfast  Objective: Vitals:   08/07/22 1938 08/07/22 2050 08/08/22 0712 08/08/22 0900  BP:  131/70  (!) 124/57  Pulse:  86  97  Resp:  16    Temp:  98.2 F (36.8 C)  (!) 97.2 F (36.2 C)  TempSrc:  Oral    SpO2: 92% 91% 93% 91%  Weight:      Height:        Intake/Output Summary (Last 24 hours) at 08/08/2022 1116 Last data filed at 08/08/2022 0840 Gross per 24 hour  Intake 717 ml  Output 515 ml  Net 202 ml     Filed Weights   08/04/22 1343 08/06/22 0541 08/07/22 0423  Weight: 39.6 kg 40.2 kg 40.1 kg    Examination:  General exam: Appears calm and comfortable  Respiratory system: Clear to auscultation. Respiratory effort normal. Cardiovascular system: S1 & S2 heard, RRR. No JVD, murmurs, rubs, gallops or clicks. No pedal edema. Gastrointestinal system: Abdomen is nondistended, soft and nontender. No organomegaly or masses felt. Normal bowel sounds heard. Central nervous system: awake Extremities: no edema   Data Reviewed: I have personally reviewed following labs and imaging studies  CBC: Recent Labs  Lab 08/04/22 0249  WBC 9.9  HGB 14.6  HCT 45.4  MCV 94.8  PLT 349    Basic Metabolic Panel: Recent Labs  Lab 08/04/22 0249 08/06/22 0235  NA 134* 137  K 4.4 3.8  CL 103 104  CO2 21* 23  GLUCOSE 169* 111*  BUN 28* 35*  CREATININE 1.07* 0.87  CALCIUM 10.1 9.6    GFR: Estimated Creatinine Clearance: 34.3 mL/min (by C-G formula based on SCr of 0.87 mg/dL). Liver Function Tests: No results for input(s): "AST", "ALT", "ALKPHOS", "BILITOT", "PROT", "ALBUMIN" in the last 168 hours. No results for input(s): "LIPASE", "AMYLASE" in the last 168 hours.  No results for input(s): "AMMONIA" in the last 168 hours. Coagulation Profile: No results for input(s): "INR", "PROTIME" in the last 168 hours. Cardiac Enzymes: No results for input(s): "CKTOTAL", "CKMB", "CKMBINDEX", "TROPONINI" in the last 168 hours. BNP (last 3 results) No results for input(s): "PROBNP" in the last 8760 hours. HbA1C: No results for input(s): "HGBA1C" in the last 72 hours. CBG: No results for input(s): "GLUCAP" in the last 168 hours. Lipid Profile: No results for input(s): "CHOL", "HDL", "LDLCALC", "TRIG", "CHOLHDL", "LDLDIRECT" in the last 72 hours. Thyroid Function Tests: No results for input(s): "TSH", "T4TOTAL", "FREET4", "T3FREE", "THYROIDAB" in the last 72 hours. Anemia Panel: No results for  input(s): "VITAMINB12", "FOLATE", "FERRITIN", "TIBC", "IRON", "RETICCTPCT" in the last 72 hours. Sepsis Labs: No results for input(s): "PROCALCITON", "LATICACIDVEN" in the last 168 hours.  No results found for this or any previous visit (from the past 240 hour(s)).       Radiology Studies: No results found.      Scheduled Meds:  amLODipine  5 mg Oral Daily   arformoterol  15 mcg Nebulization BID   budesonide (PULMICORT) nebulizer solution  0.5 mg Nebulization BID   carvedilol  3.125 mg Oral BID WC   enoxaparin (LOVENOX) injection  30 mg Subcutaneous Daily   feeding supplement  237 mL Oral BID BM   mupirocin cream   Topical BID   nicotine  21 mg Transdermal Daily   nutrition supplement (JUVEN)  1 packet Oral BID BM   mouth rinse  15 mL Mouth Rinse 4 times per day   pantoprazole  40 mg Oral Daily   QUEtiapine  25 mg Oral BID   QUEtiapine  75 mg Oral QHS   revefenacin  175 mcg Nebulization Daily   Continuous Infusions:   LOS: 57 days    Time spent: 30 min  Alwyn Ren, MD 08/08/2022, 11:16 AM

## 2022-08-08 NOTE — Progress Notes (Addendum)
Physical Therapy Treatment Patient Details Name: Carla [name] MRN: [id number] DOB: September 14, 1945 Today's Date: 08/08/2022   History of Present Illness 77 yo female admitted 3/20 unresponsive from home with acute respiratory failure with hypoxia and hypercapnia and metabolic encephalopathy. Intubated 3/20. trach 4/8. Self decannulated. PMhx: emphysema, tobacco abuse, PVD, HTN, prediabetes, anxiety, dementia    PT Comments    Patient is agreeable to PT. She has a Comptroller at the bedside. Patient required Min A for standing and hallway ambulation. Mild unsteadiness with dynamic standing activity and ambulation. Increased time for processing with cues for task initiation provided with functional mobility. Patient is cooperative throughout session. PT will continue to follow to maximize independence and facilitate return to prior level of function.    Recommendations for follow up therapy are one component of a multi-disciplinary discharge planning process, led by the attending physician.  Recommendations may be updated based on patient status, additional functional criteria and insurance authorization.  Follow Up Recommendations  Can patient physically be transported by private vehicle: Yes    Assistance Recommended at Discharge Frequent or constant Supervision/Assistance  Patient can return home with the following Direct supervision/assist for medications management;Assistance with cooking/housework;Assist for transportation;A little help with walking and/or transfers;A little help with bathing/dressing/bathroom   Equipment Recommendations  Rolling walker (2 wheels)    Recommendations for Other Services       Precautions / Restrictions Precautions Precautions: Fall Restrictions Weight Bearing Restrictions: No     Mobility  Bed Mobility Overal bed mobility: Needs Assistance Bed Mobility: Supine to Sit, Sit to Supine     Supine to sit: Min assist Sit to supine: Min guard    General bed mobility comments: increased time and effort required. cues for task initiation    Transfers Overall transfer level: Needs assistance Equipment used: 1 person hand held assist Transfers: Sit to/from Stand Sit to Stand: Min guard           General transfer comment: Min guard for safety    Ambulation/Gait Ambulation/Gait assistance: Min assist Gait Distance (Feet): 175 Feet Assistive device: 1 person hand held assist Gait Pattern/deviations: Step-through pattern, Narrow base of support Gait velocity: decreased     General Gait Details: assistance required for steadying as patient has narrow base of support with mild unsteadiness. navigational cues also required with mobility. mild dyspnea at the end of the walking bout   Stairs             Wheelchair Mobility    Modified Rankin (Stroke Patients Only)       Balance Overall balance assessment: Needs assistance Sitting-balance support: Feet supported Sitting balance-Leahy Scale: Fair     Standing balance support: Single extremity supported Standing balance-Leahy Scale: Poor Standing balance comment: external support required, unsteadiness with dyanmic activity                            Cognition Arousal/Alertness: Awake/alert Behavior During Therapy: Flat affect Overall Cognitive Status: No family/caregiver present to determine baseline cognitive functioning Area of Impairment: Attention, Following commands, Problem solving, Orientation, Memory, Safety/judgement, Awareness                 Orientation Level: Disoriented to, Place, Time, Situation Current Attention Level: Sustained Memory: Decreased short-term memory, Decreased recall of precautions Following Commands: Follows one step commands consistently Safety/Judgement: Decreased awareness of safety, Decreased awareness of deficits Awareness: Intellectual Problem Solving: Slow processing, Requires verbal cues, Decreased  initiation  Exercises      General Comments        Pertinent Vitals/Pain Pain Assessment Pain Assessment: No/denies pain    Home Living                          Prior Function            PT Goals (current goals can now be found in the care plan section) Acute Rehab PT Goals Patient Stated Goal: not stated PT Goal Formulation: Patient unable to participate in goal setting Time For Goal Achievement: 08/19/22 Potential to Achieve Goals: Good Progress towards PT goals: Progressing toward goals    Frequency    Min 1X/week      PT Plan Current plan remains appropriate    Co-evaluation              AM-PAC PT "6 Clicks" Mobility   Outcome Measure  Help needed turning from your back to your side while in a flat bed without using bedrails?: A Little Help needed moving from lying on your back to sitting on the side of a flat bed without using bedrails?: A Little Help needed moving to and from a bed to a chair (including a wheelchair)?: A Little Help needed standing up from a chair using your arms (e.g., wheelchair or bedside chair)?: A Little Help needed to walk in hospital room?: A Little Help needed climbing 3-5 steps with a railing? : A Little 6 Click Score: 18    End of Session Equipment Utilized During Treatment: Gait belt Activity Tolerance: Patient tolerated treatment well Patient left: in bed;with call bell/phone within reach;with bed alarm set;with nursing/sitter in room (1:1 sitter in the room) Nurse Communication: Mobility status PT Visit Diagnosis: Other abnormalities of gait and mobility (R26.89);Muscle weakness (generalized) (M62.81);Difficulty in walking, not elsewhere classified (R26.2)     Time: 1610-9604 PT Time Calculation (min) (ACUTE ONLY): 11 min  Charges:  $Therapeutic Activity: 8-22 mins                     Donna Bernard, PT, MPT    Ina Homes 08/08/2022, 2:42 PM

## 2022-08-09 DIAGNOSIS — J9602 Acute respiratory failure with hypercapnia: Secondary | ICD-10-CM | POA: Diagnosis not present

## 2022-08-09 DIAGNOSIS — J0410 Acute tracheitis without obstruction: Secondary | ICD-10-CM | POA: Diagnosis not present

## 2022-08-09 DIAGNOSIS — J9601 Acute respiratory failure with hypoxia: Secondary | ICD-10-CM | POA: Diagnosis not present

## 2022-08-09 NOTE — TOC Progression Note (Addendum)
Transition of Care [facility]) - Progression Note    Patient Details  Name: Carla [name] MRN: [id number] Date of Birth: December 29, 1945  Transition of Care [facility]) CM/SW Contact  Alesia Oshields A [place], [place] Phone Number: 08/09/2022, 10:45 AM  Clinical Narrative:     CSW contacted pt's granddaughter, Adria Devon, [phone] regarding pt's potential discharge to [facility] facility. There was no answer. CSW left HIPAA compliant voicemail regarding to reach back out to CSW.   TOC will continue to follow.   Expected Discharge Plan: Skilled Nursing Facility Barriers to Discharge: Continued Medical Work up  Expected Discharge Plan and Services   Discharge Planning Services: CM Consult Post Acute Care Choice: Long Term Acute Care (LTAC)                                         Social Determinants of Health (SDOH) Interventions SDOH Screenings   Food Insecurity: No Food Insecurity (07/28/2022)  Housing: Low Risk  (07/28/2022)  Transportation Needs: No Transportation Needs (07/28/2022)  Utilities: Not At Risk (07/28/2022)  Tobacco Use: High Risk (07/28/2022)    Readmission Risk Interventions    07/22/2022    1:43 PM  Readmission Risk Prevention Plan  Transportation Screening Complete  Palliative Care Screening Complete

## 2022-08-09 NOTE — TOC Progression Note (Addendum)
Transition of Care [facility]) - Progression Note    Patient Details  Name: Carla [name] MRN: [id number] Date of Birth: 1945-11-15  Transition of Care [facility]) CM/SW Contact  Candace Ramus A [place], [place] Phone Number: 08/09/2022, 3:08 PM  Clinical Narrative:    CSW contacted pt's husband, Carla [name], regarding planning for memory care facility. He stated that he was in the hospital in [place] and has not been very involved with finding pt a facility at discharge. He said that he would reach out to his granddaughter Carla [name] and inform her to reach out to CSW.   CSW inquired about pt's son contact information because it was not a working number. He stated he did not know the number. CSW will continue to reach out to other family members.   TOC will continue to follow.   1109: CSW contacted pt's grand daughter, Carla [name], regarding updates on facility admission to [facility].   CSW left voicemail as there was no answer.   TOC will continue to follow.   CSW contacted Carla [name], [phone] at [facility]. Staff member Amy informed CSW that Carla [name] was out for the day and would return in office on Tuesday. CSW will follow up then regarding any updates on pt's admission status.   TOC will continue to follow.   Expected Discharge Plan: Skilled Nursing Facility Barriers to Discharge: Continued Medical Work up  Expected Discharge Plan and Services   Discharge Planning Services: CM Consult Post Acute Care Choice: Long Term Acute Care (LTAC)                                         Social Determinants of Health (SDOH) Interventions SDOH Screenings   Food Insecurity: No Food Insecurity (07/28/2022)  Housing: Low Risk  (07/28/2022)  Transportation Needs: No Transportation Needs (07/28/2022)  Utilities: Not At Risk (07/28/2022)  Tobacco Use: High Risk (07/28/2022)    Readmission Risk Interventions    07/22/2022    1:43 PM  Readmission Risk Prevention Plan   Transportation Screening Complete  Palliative Care Screening Complete

## 2022-08-09 NOTE — Progress Notes (Addendum)
PROGRESS NOTE    Carla [name]  ZOX:[id number] DOB: [birth year] DOA: 06/12/2022 PCP: Patient, [name]  Brief Narrative: 71 yof w/ COPD/emphysema tobacco use, Hypertension, Dementia, Anxiety, Depression, GERD, Primary hyperparathyroidism, Vit D deficiency, HLD, Osteoporosis, found by family unresponsive and brought to ER on 06/12/22 and found to have acute on chronic hypercapnia on ABG>Intubated in ER and treated for COPD exacerbation. She had prolonged complicated hospitalization charted as below with multiple ICU admissions, needing tracheostomy placement, having encephalopathy with baseline dementia and pulling out her PICC line tracheostomy.   Significant Events  3/20 Admitted after being found unresponsive, intubated on ED arrival  CT angio chest >> atherosclerosis, severe coronary calcification, severe LVH, mild centrilobular emphysema, 2 mm nodule LUL, 4 mm nodule LUL Blood culture  >> Haemophilus influenzae 3/22 Had oxygen desaturation and low Vt with SBT this morning. Echo 06/14/22 >> EF 60 to 65%, mod LVH, grade 1 DD, mild/mod AR 3/25 bronch for peak pressures, low tidal volumes with mucus cast at end of ETT, thick secretions caking tube 3/29 Bronched again with purulent secretions LLL with plugging and LLL atelectasis, some increase in O2 requirement on vent, zosyn started for VAP coverage. MRSA nare swab today negative.  4/2 family meeting, made DNR, plans for one way extubation 4/5 4/6-family changes mind regarding one-way extubation, plan will be for tracheostomy 4/8 trach placed 4/18 Cortrak clogged and thus replaced. Still working on weaning completed CTx for PNA. 48 hrs on ATC  4/19 off vent x 72 hours so removed from room.  Planning on changing trach to cuffless.  Continuing to work with PMV.  Hypoglycemia adding D5 to maintenance fluids 4/26: Patient pulled tracheostomy tube.  4/28: Patient pulled PICC line, PIV inserted, Soft restraints.  Assessment & Plan:   Principal  Problem:   Acute respiratory failure with hypoxia and hypercapnia (HCC) Active Problems:   COPD with exacerbation (HCC)   Hyperglycemia   Acute metabolic encephalopathy   HTN (hypertension)   Tobacco abuse   Anxiety   Peripheral vascular disease (HCC)   Pulmonary nodules   Dementia (HCC)   Protein-calorie malnutrition, severe (HCC)   Urinary retention   Hypervolemia   Encephalopathy acute   Tracheostomy in place [facility])   AKI (acute kidney injury) (HCC)   Hypernatremia   Fever   Acute respiratory failure with hypercapnia (HCC)   Hyponatremia   Pressure injury of skin   Acute hypoxic/hypercapnic respiratory failure: Complicated hospital course w/ intubation, tracheostomy placement>she pulled out trach on 4/26.  Fortunately doing well without tracheostomy, site is healed.   Currently on room air. Cont Yupelri, Brovana Pulmicort  Nebs, and bronchodilator PRN. Should be able to switch her over to inhaler treatment at discharge with [facility] and [facility]. She has completed a 5-day course of doxycycline.   Acute metabolic encephalopathy in the background of dementia Based on previous reports it looks like at baseline patient has regular conversations with herself and wanders out of the house.    Presented with unresponsiveness s/p extensive workup including  head CT negative for acute abnormality. Felt to be due to respiratory failure and also with ICU delirium.   Patient remains confused.  Patient was seen by psychiatry and started on Seroquel.   Dose of Seroquel increased to 75 mg qhs 5/15 Currently off of benzodiazepines. Patient seems to be stable.  Continue to monitor.   Dysphagia Initially on core track>now tolerating DYS3 diet.    Multiple falls in the hospital  Found on the  floor on 5/2  and 5/8.  Underwent imaging studies which did not show any injuries.   Type 2 diabetes mellitus  HbA1c 6.0.  Monitor CBGs.   Acute kidney injury Resolved.    Pulmonary  nodules Incidental finding on CT 06/12/2022 - 2 mm and 4 mm noncalcified left upper lobe lung nodules.  May need outpatient surveillance depending on her prognosis and goals of care going forward.   Essential hypertension Well controlled on norvasc and Coreg. Basic metabolic panel from 5/12 showed mildly elevated creatinine.  Patient encouraged to stay well-hydrated.  Labs from this morning show improvement in creatinine.   Chronic diastolic CHF  Currently euvolemic   Hypokalemia Resolved    Physical deconditioning PT and OT has not seen her.  SNF is recommended.  Difficult to place.  Social worker is following.  Family looking into assisted living facilities/memory care unit   Severe malnutrition  Encourage oral intake.  On Ensure.   Nutrition Problem: Severe Malnutrition Etiology: chronic illness (COPD, dementia)  Signs/Symptoms: severe fat depletion, severe muscle depletion  Interventions: Ensure Enlive (each supplement provides 350kcal and 20 grams of protein), Juven  Estimated body mass index is 17.27 kg/m as calculated from the following:   Height as of this encounter: 5' (1.524 m).   Weight as of this encounter: 40.1 kg.  DVT prophylaxis: lovenox Code Status:dnr Family Communication: none Disposition Plan:  Status is: Inpatient Remains inpatient appropriate because: medically stable for dc   Consultants:  Pulm psych  Subjective:  No events reported by the staff overnight when I saw her she was sitting up by the side of the bed and trying to eat breakfast  Objective: Vitals:   08/09/22 0705 08/09/22 0708 08/09/22 0710 08/09/22 0909  BP:    (!) 155/65  Pulse:    88  Resp:    18  Temp:    97.6 F (36.4 C)  TempSrc:      SpO2: 95% 95% 95% 97%  Weight:      Height:        Intake/Output Summary (Last 24 hours) at 08/09/2022 1303 Last data filed at 08/08/2022 1330 Gross per 24 hour  Intake 240 ml  Output --  Net 240 ml    Filed Weights   08/04/22 1343  08/06/22 0541 08/07/22 0423  Weight: 39.6 kg 40.2 kg 40.1 kg    Examination:  General exam: Appears calm and comfortable  Respiratory system: Clear to auscultation. Respiratory effort normal. Cardiovascular system: S1 & S2 heard, RRR. No JVD, murmurs, rubs, gallops or clicks. No pedal edema. Gastrointestinal system: Abdomen is nondistended, soft and nontender. No organomegaly or masses felt. Normal bowel sounds heard. Central nervous system: awake Extremities: no edema   Data Reviewed: I have personally reviewed following labs and imaging studies  CBC: Recent Labs  Lab 08/04/22 0249  WBC 9.9  HGB 14.6  HCT 45.4  MCV 94.8  PLT 349    Basic Metabolic Panel: Recent Labs  Lab 08/04/22 0249 08/06/22 0235  NA 134* 137  K 4.4 3.8  CL 103 104  CO2 21* 23  GLUCOSE 169* 111*  BUN 28* 35*  CREATININE 1.07* 0.87  CALCIUM 10.1 9.6    GFR: Estimated Creatinine Clearance: 34.3 mL/min (by C-G formula based on SCr of 0.87 mg/dL). Liver Function Tests: No results for input(s): "AST", "ALT", "ALKPHOS", "BILITOT", "PROT", "ALBUMIN" in the last 168 hours. No results for input(s): "LIPASE", "AMYLASE" in the last 168 hours. No results for input(s): "AMMONIA"  in the last 168 hours. Coagulation Profile: No results for input(s): "INR", "PROTIME" in the last 168 hours. Cardiac Enzymes: No results for input(s): "CKTOTAL", "CKMB", "CKMBINDEX", "TROPONINI" in the last 168 hours. BNP (last 3 results) No results for input(s): "PROBNP" in the last 8760 hours. HbA1C: No results for input(s): "HGBA1C" in the last 72 hours. CBG: No results for input(s): "GLUCAP" in the last 168 hours. Lipid Profile: No results for input(s): "CHOL", "HDL", "LDLCALC", "TRIG", "CHOLHDL", "LDLDIRECT" in the last 72 hours. Thyroid Function Tests: No results for input(s): "TSH", "T4TOTAL", "FREET4", "T3FREE", "THYROIDAB" in the last 72 hours. Anemia Panel: No results for input(s): "VITAMINB12", "FOLATE",  "FERRITIN", "TIBC", "IRON", "RETICCTPCT" in the last 72 hours. Sepsis Labs: No results for input(s): "PROCALCITON", "LATICACIDVEN" in the last 168 hours.  No results found for this or any previous visit (from the past 240 hour(s)).       Radiology Studies: No results found.      Scheduled Meds:  amLODipine  5 mg Oral Daily   arformoterol  15 mcg Nebulization BID   budesonide (PULMICORT) nebulizer solution  0.5 mg Nebulization BID   carvedilol  3.125 mg Oral BID WC   enoxaparin (LOVENOX) injection  30 mg Subcutaneous Daily   feeding supplement  237 mL Oral BID BM   mupirocin cream   Topical BID   nicotine  21 mg Transdermal Daily   nutrition supplement (JUVEN)  1 packet Oral BID BM   mouth rinse  15 mL Mouth Rinse 4 times per day   pantoprazole  40 mg Oral Daily   QUEtiapine  25 mg Oral BID   QUEtiapine  75 mg Oral QHS   revefenacin  175 mcg Nebulization Daily   Continuous Infusions:   LOS: 58 days    Time spent: 30 min  Alwyn Ren, MD 08/09/2022, 1:03 PM

## 2022-08-10 DIAGNOSIS — G9340 Encephalopathy, unspecified: Secondary | ICD-10-CM | POA: Diagnosis not present

## 2022-08-10 NOTE — Progress Notes (Addendum)
PROGRESS NOTE    AUBERY ARCE  WJX:[id number] DOB: 07-Jul-1945 DOA: 06/12/2022 PCP: Patient, [name]  Brief Narrative: 57 yof w/ COPD/emphysema tobacco use, Hypertension, Dementia, Anxiety, Depression, GERD, Primary hyperparathyroidism, Vit D deficiency, HLD, Osteoporosis, found by family unresponsive and brought to ER on 06/12/22 and found to have acute on chronic hypercapnia on ABG>Intubated in ER and treated for COPD exacerbation. She had prolonged complicated hospitalization charted as below with multiple ICU admissions, needing tracheostomy placement, having encephalopathy with baseline dementia and pulling out her PICC line tracheostomy.   Significant Events  3/20 Admitted after being found unresponsive, intubated on ED arrival  CT angio chest >> atherosclerosis, severe coronary calcification, severe LVH, mild centrilobular emphysema, 2 mm nodule LUL, 4 mm nodule LUL Blood culture  >> Haemophilus influenzae 3/22 Had oxygen desaturation and low Vt with SBT this morning. Echo 06/14/22 >> EF 60 to 65%, mod LVH, grade 1 DD, mild/mod AR 3/25 bronch for peak pressures, low tidal volumes with mucus cast at end of ETT, thick secretions caking tube 3/29 Bronched again with purulent secretions LLL with plugging and LLL atelectasis, some increase in O2 requirement on vent, zosyn started for VAP coverage. MRSA nare swab today negative.  4/2 family meeting, made DNR, plans for one way extubation 4/5 4/6-family changes mind regarding one-way extubation, plan will be for tracheostomy 4/8 trach placed 4/18 Cortrak clogged and thus replaced. Still working on weaning completed CTx for PNA. 48 hrs on ATC  4/19 off vent x 72 hours so removed from room.  Planning on changing trach to cuffless.  Continuing to work with PMV.  Hypoglycemia adding D5 to maintenance fluids 4/26: Patient pulled tracheostomy tube.  4/28: Patient pulled PICC line, PIV inserted, Soft restraints.  Assessment & Plan:   Principal  Problem:   Acute respiratory failure with hypoxia and hypercapnia (HCC) Active Problems:   COPD with exacerbation (HCC)   Hyperglycemia   Acute metabolic encephalopathy   HTN (hypertension)   Tobacco abuse   Anxiety   Peripheral vascular disease (HCC)   Pulmonary nodules   Dementia (HCC)   Protein-calorie malnutrition, severe (HCC)   Urinary retention   Hypervolemia   Encephalopathy acute   Tracheostomy in place [facility])   AKI (acute kidney injury) (HCC)   Hypernatremia   Fever   Acute respiratory failure with hypercapnia (HCC)   Hyponatremia   Pressure injury of skin   Acute hypoxic/hypercapnic respiratory failure: Complicated hospital course w/ intubation, tracheostomy placement>she pulled out trach on 4/26.  Fortunately doing well without tracheostomy, site is healed.   Currently on room air. Cont Yupelri, Brovana Pulmicort  Nebs, and bronchodilator PRN. Should be able to switch her over to inhaler treatment at discharge with [facility] and [facility]. She has completed a 5-day course of doxycycline. PT consulted at the request of daughter Labs as needed   Acute metabolic encephalopathy in the background of dementia Based on previous reports it looks like at baseline patient has regular conversations with herself and wanders out of the house.    Presented with unresponsiveness s/p extensive workup including  head CT negative for acute abnormality. Felt to be due to respiratory failure and also with ICU delirium.   Patient remains confused.  Patient was seen by psychiatry and started on Seroquel.   Dose of Seroquel increased to 75 mg qhs 5/15 Currently off of benzodiazepines. Patient seems to be stable.  Continue to monitor.   Dysphagia Initially on core track>now tolerating DYS3 diet.  Multiple falls in the hospital  Found on the floor on 5/2  and 5/8.  Underwent imaging studies which did not show any injuries.   Type 2 diabetes mellitus  HbA1c 6.0.  Monitor CBGs.    Acute kidney injury Resolved.    Pulmonary nodules Incidental finding on CT 06/12/2022 - 2 mm and 4 mm noncalcified left upper lobe lung nodules.  May need outpatient surveillance depending on her prognosis and goals of care going forward.   Essential hypertension Well controlled on norvasc and Coreg. Basic metabolic panel from 5/12 showed mildly elevated creatinine.  Patient encouraged to stay well-hydrated.  Labs from this morning show improvement in creatinine.   Chronic diastolic CHF  Currently euvolemic   Hypokalemia Resolved    Physical deconditioning PT and OT has not seen her.  SNF is recommended.  Difficult to place.  Social worker is following.  Family looking into assisted living facilities/memory care unit   Severe malnutrition  Encourage oral intake.  On Ensure.   Nutrition Problem: Severe Malnutrition Etiology: chronic illness (COPD, dementia)  Signs/Symptoms: severe fat depletion, severe muscle depletion  Interventions: Ensure Enlive (each supplement provides 350kcal and 20 grams of protein), Juven  Estimated body mass index is 17.74 kg/m as calculated from the following:   Height as of this encounter: 5' (1.524 m).   Weight as of this encounter: 41.2 kg.  DVT prophylaxis: lovenox Code Status:dnr Family Communication: none Disposition Plan:  Status is: Inpatient Remains inpatient appropriate because: medically stable for dc   Consultants:  Pulm psych  Subjective:  Sitting up in bed anxious to eat breakfast still waiting for breakfast Consult physical therapy at the request of daughter Objective: Vitals:   08/09/22 1918 08/10/22 0500 08/10/22 0750 08/10/22 0847  BP: (!) 159/75  (!) 143/67   Pulse: 96  78 78  Resp: 20  15 18   Temp: 97.6 F (36.4 C)  98 F (36.7 C)   TempSrc: Oral  Oral   SpO2: 98%  95% 95%  Weight:  41.2 kg    Height:       No intake or output data in the 24 hours ending 08/10/22 1140  Filed Weights   08/06/22 0541  08/07/22 0423 08/10/22 0500  Weight: 40.2 kg 40.1 kg 41.2 kg    Examination:  General exam: Appears calm and comfortable  Respiratory system: Clear to auscultation. Respiratory effort normal. Cardiovascular system: S1 & S2 heard, RRR. No JVD, murmurs, rubs, gallops or clicks. No pedal edema. Gastrointestinal system: Abdomen is nondistended, soft and nontender. No organomegaly or masses felt. Normal bowel sounds heard. Central nervous system: awake Extremities: no edema   Data Reviewed: I have personally reviewed following labs and imaging studies  CBC: Recent Labs  Lab 08/04/22 0249  WBC 9.9  HGB 14.6  HCT 45.4  MCV 94.8  PLT 349    Basic Metabolic Panel: Recent Labs  Lab 08/04/22 0249 08/06/22 0235  NA 134* 137  K 4.4 3.8  CL 103 104  CO2 21* 23  GLUCOSE 169* 111*  BUN 28* 35*  CREATININE 1.07* 0.87  CALCIUM 10.1 9.6    GFR: Estimated Creatinine Clearance: 35.2 mL/min (by C-G formula based on SCr of 0.87 mg/dL). Liver Function Tests: No results for input(s): "AST", "ALT", "ALKPHOS", "BILITOT", "PROT", "ALBUMIN" in the last 168 hours. No results for input(s): "LIPASE", "AMYLASE" in the last 168 hours. No results for input(s): "AMMONIA" in the last 168 hours. Coagulation Profile: No results for input(s): "INR", "PROTIME"  in the last 168 hours. Cardiac Enzymes: No results for input(s): "CKTOTAL", "CKMB", "CKMBINDEX", "TROPONINI" in the last 168 hours. BNP (last 3 results) No results for input(s): "PROBNP" in the last 8760 hours. HbA1C: No results for input(s): "HGBA1C" in the last 72 hours. CBG: No results for input(s): "GLUCAP" in the last 168 hours. Lipid Profile: No results for input(s): "CHOL", "HDL", "LDLCALC", "TRIG", "CHOLHDL", "LDLDIRECT" in the last 72 hours. Thyroid Function Tests: No results for input(s): "TSH", "T4TOTAL", "FREET4", "T3FREE", "THYROIDAB" in the last 72 hours. Anemia Panel: No results for input(s): "VITAMINB12", "FOLATE",  "FERRITIN", "TIBC", "IRON", "RETICCTPCT" in the last 72 hours. Sepsis Labs: No results for input(s): "PROCALCITON", "LATICACIDVEN" in the last 168 hours.  No results found for this or any previous visit (from the past 240 hour(s)).       Radiology Studies: No results found.      Scheduled Meds:  amLODipine  5 mg Oral Daily   arformoterol  15 mcg Nebulization BID   budesonide (PULMICORT) nebulizer solution  0.5 mg Nebulization BID   carvedilol  3.125 mg Oral BID WC   enoxaparin (LOVENOX) injection  30 mg Subcutaneous Daily   feeding supplement  237 mL Oral BID BM   mupirocin cream   Topical BID   nicotine  21 mg Transdermal Daily   nutrition supplement (JUVEN)  1 packet Oral BID BM   mouth rinse  15 mL Mouth Rinse 4 times per day   pantoprazole  40 mg Oral Daily   QUEtiapine  25 mg Oral BID   QUEtiapine  75 mg Oral QHS   revefenacin  175 mcg Nebulization Daily   Continuous Infusions:   LOS: 59 days    Time spent: 30 min  Alwyn Ren, MD 08/10/2022, 11:40 AM

## 2022-08-11 DIAGNOSIS — G9340 Encephalopathy, unspecified: Secondary | ICD-10-CM | POA: Diagnosis not present

## 2022-08-11 NOTE — Progress Notes (Addendum)
PROGRESS NOTE    Carla [name]  XTG:[id number] DOB: 1946-03-17 DOA: 06/12/2022 PCP: Patient, [name]  Brief Narrative: 50 yof w/ COPD/emphysema tobacco use, Hypertension, Dementia, Anxiety, Depression, GERD, Primary hyperparathyroidism, Vit D deficiency, HLD, Osteoporosis, found by family unresponsive and brought to ER on 06/12/22 and found to have acute on chronic hypercapnia on ABG>Intubated in ER and treated for COPD exacerbation. She had prolonged complicated hospitalization charted as below with multiple ICU admissions, needing tracheostomy placement, having encephalopathy with baseline dementia and pulling out her PICC line tracheostomy.   Significant Events  3/20 Admitted after being found unresponsive, intubated on ED arrival  CT angio chest >> atherosclerosis, severe coronary calcification, severe LVH, mild centrilobular emphysema, 2 mm nodule LUL, 4 mm nodule LUL Blood culture  >> Haemophilus influenzae 3/22 Had oxygen desaturation and low Vt with SBT this morning. Echo 06/14/22 >> EF 60 to 65%, mod LVH, grade 1 DD, mild/mod AR 3/25 bronch for peak pressures, low tidal volumes with mucus cast at end of ETT, thick secretions caking tube 3/29 Bronched again with purulent secretions LLL with plugging and LLL atelectasis, some increase in O2 requirement on vent, zosyn started for VAP coverage. MRSA nare swab today negative.  4/2 family meeting, made DNR, plans for one way extubation 4/5 4/6-family changes mind regarding one-way extubation, plan will be for tracheostomy 4/8 trach placed 4/18 Cortrak clogged and thus replaced. Still working on weaning completed CTx for PNA. 48 hrs on ATC  4/19 off vent x 72 hours so removed from room.  Planning on changing trach to cuffless.  Continuing to work with PMV.  Hypoglycemia adding D5 to maintenance fluids 4/26: Patient pulled tracheostomy tube.  4/28: Patient pulled PICC line, PIV inserted, Soft restraints.  Assessment & Plan:   Principal  Problem:   Acute respiratory failure with hypoxia and hypercapnia (HCC) Active Problems:   COPD with exacerbation (HCC)   Hyperglycemia   Acute metabolic encephalopathy   HTN (hypertension)   Tobacco abuse   Anxiety   Peripheral vascular disease (HCC)   Pulmonary nodules   Dementia (HCC)   Protein-calorie malnutrition, severe (HCC)   Urinary retention   Hypervolemia   Encephalopathy acute   Tracheostomy in place [facility])   AKI (acute kidney injury) (HCC)   Hypernatremia   Fever   Acute respiratory failure with hypercapnia (HCC)   Hyponatremia   Pressure injury of skin   Acute hypoxic/hypercapnic respiratory failure: Complicated hospital course w/ intubation, tracheostomy placement>she pulled out trach on 4/26.  Fortunately doing well without tracheostomy, site is healed.   Currently on room air. Cont Yupelri, Brovana Pulmicort  Nebs, and bronchodilator PRN. Should be able to switch her over to inhaler treatment at discharge with [facility] and [facility]. She has completed a 5-day course of doxycycline. PT consulted at the request of daughter Labs 5/20   Acute metabolic encephalopathy in the background of dementia Based on previous reports it looks like at baseline patient has regular conversations with herself and wanders out of the house.    Presented with unresponsiveness s/p extensive workup including  head CT negative for acute abnormality. Felt to be due to respiratory failure and also with ICU delirium.   Patient remains confused.  Patient was seen by psychiatry and started on Seroquel.   Dose of Seroquel increased to 75 mg qhs 5/15 Currently off of benzodiazepines. Patient seems to be stable.  Continue to monitor.   Dysphagia Initially on core track>now tolerating DYS3 diet.  Multiple falls in the hospital  Found on the floor on 5/2  and 5/8.  Underwent imaging studies which did not show any injuries.   Type 2 diabetes mellitus  HbA1c 6.0.  Monitor CBGs.   Acute  kidney injury Resolved.    Pulmonary nodules Incidental finding on CT 06/12/2022 - 2 mm and 4 mm noncalcified left upper lobe lung nodules.  May need outpatient surveillance depending on her prognosis and goals of care going forward.   Essential hypertension Well controlled on norvasc and Coreg. Basic metabolic panel from 5/12 showed mildly elevated creatinine.  Patient encouraged to stay well-hydrated.  Labs from this morning show improvement in creatinine.   Chronic diastolic CHF  Currently euvolemic   Hypokalemia Resolved    Physical deconditioning PT and OT has not seen her.  SNF is recommended.  Difficult to place.  Social worker is following.  Family looking into assisted living facilities/memory care unit   Severe malnutrition  Encourage oral intake.  On Ensure.   Nutrition Problem: Severe Malnutrition Etiology: chronic illness (COPD, dementia)  Signs/Symptoms: severe fat depletion, severe muscle depletion  Interventions: Ensure Enlive (each supplement provides 350kcal and 20 grams of protein), Juven  Estimated body mass index is 16.32 kg/m as calculated from the following:   Height as of this encounter: 5' (1.524 m).   Weight as of this encounter: 37.9 kg.  DVT prophylaxis: lovenox Code Status:dnr Family Communication: none Disposition Plan:  Status is: Inpatient Remains inpatient appropriate because: medically stable for dc   Consultants:  Pulm psych  Subjective: Irritated at times  Objective: Vitals:   08/10/22 1723 08/11/22 0500 08/11/22 0700 08/11/22 0825  BP: 128/68  132/83   Pulse: 89  84 79  Resp:   16 16  Temp:   97.6 F (36.4 C)   TempSrc:   Oral   SpO2:   94% 92%  Weight:  37.9 kg    Height:        Intake/Output Summary (Last 24 hours) at 08/11/2022 1232 Last data filed at 08/11/2022 1231 Gross per 24 hour  Intake 720 ml  Output --  Net 720 ml    Filed Weights   08/07/22 0423 08/10/22 0500 08/11/22 0500  Weight: 40.1 kg 41.2 kg  37.9 kg    Examination:  General exam: Appears calm and comfortable  Respiratory system: Clear to auscultation. Respiratory effort normal. Cardiovascular system: S1 & S2 heard, RRR. No JVD, murmurs, rubs, gallops or clicks. No pedal edema. Gastrointestinal system: Abdomen is nondistended, soft and nontender. No organomegaly or masses felt. Normal bowel sounds heard. Central nervous system: awake Extremities: no edema   Data Reviewed: I have personally reviewed following labs and imaging studies  CBC: No results for input(s): "WBC", "NEUTROABS", "HGB", "HCT", "MCV", "PLT" in the last 168 hours.  Basic Metabolic Panel: Recent Labs  Lab 08/06/22 0235  NA 137  K 3.8  CL 104  CO2 23  GLUCOSE 111*  BUN 35*  CREATININE 0.87  CALCIUM 9.6    GFR: Estimated Creatinine Clearance: 32.4 mL/min (by C-G formula based on SCr of 0.87 mg/dL). Liver Function Tests: No results for input(s): "AST", "ALT", "ALKPHOS", "BILITOT", "PROT", "ALBUMIN" in the last 168 hours. No results for input(s): "LIPASE", "AMYLASE" in the last 168 hours. No results for input(s): "AMMONIA" in the last 168 hours. Coagulation Profile: No results for input(s): "INR", "PROTIME" in the last 168 hours. Cardiac Enzymes: No results for input(s): "CKTOTAL", "CKMB", "CKMBINDEX", "TROPONINI" in the last 168 hours.  BNP (last 3 results) No results for input(s): "PROBNP" in the last 8760 hours. HbA1C: No results for input(s): "HGBA1C" in the last 72 hours. CBG: No results for input(s): "GLUCAP" in the last 168 hours. Lipid Profile: No results for input(s): "CHOL", "HDL", "LDLCALC", "TRIG", "CHOLHDL", "LDLDIRECT" in the last 72 hours. Thyroid Function Tests: No results for input(s): "TSH", "T4TOTAL", "FREET4", "T3FREE", "THYROIDAB" in the last 72 hours. Anemia Panel: No results for input(s): "VITAMINB12", "FOLATE", "FERRITIN", "TIBC", "IRON", "RETICCTPCT" in the last 72 hours. Sepsis Labs: No results for input(s):  "PROCALCITON", "LATICACIDVEN" in the last 168 hours.  No results found for this or any previous visit (from the past 240 hour(s)).       Radiology Studies: No results found.      Scheduled Meds:  amLODipine  5 mg Oral Daily   arformoterol  15 mcg Nebulization BID   budesonide (PULMICORT) nebulizer solution  0.5 mg Nebulization BID   carvedilol  3.125 mg Oral BID WC   enoxaparin (LOVENOX) injection  30 mg Subcutaneous Daily   feeding supplement  237 mL Oral BID BM   mupirocin cream   Topical BID   nicotine  21 mg Transdermal Daily   nutrition supplement (JUVEN)  1 packet Oral BID BM   mouth rinse  15 mL Mouth Rinse 4 times per day   pantoprazole  40 mg Oral Daily   QUEtiapine  25 mg Oral BID   QUEtiapine  75 mg Oral QHS   revefenacin  175 mcg Nebulization Daily   Continuous Infusions:   LOS: 60 days    Time spent: 20 min  Alwyn Ren, MD 08/11/2022, 12:32 PM

## 2022-08-12 MED ORDER — BOOST / RESOURCE BREEZE PO LIQD CUSTOM
1.0000 | Freq: Two times a day (BID) | ORAL | Status: DC
  Administered 2022-08-13 – 2022-08-27 (×20): 1 via ORAL

## 2022-08-12 MED ORDER — MEDIHONEY WOUND/BURN DRESSING EX PSTE
1.0000 | PASTE | Freq: Every day | CUTANEOUS | Status: DC
  Administered 2022-08-13 – 2022-08-29 (×10): 1 via TOPICAL
  Filled 2022-08-12: qty 44

## 2022-08-12 NOTE — Progress Notes (Addendum)
PROGRESS NOTE    Carla [name]  XBM:[id number] DOB: December 11, 1945 DOA: 06/12/2022 PCP: Patient, [name]  Brief Narrative: 32 yof w/ COPD/emphysema tobacco use, Hypertension, Dementia, Anxiety, Depression, GERD, Primary hyperparathyroidism, Vit D deficiency, HLD, Osteoporosis, found by family unresponsive and brought to ER on 06/12/22 and found to have acute on chronic hypercapnia on ABG>Intubated in ER and treated for COPD exacerbation. She had prolonged complicated hospitalization charted as below with multiple ICU admissions, needing tracheostomy placement, having encephalopathy with baseline dementia and pulling out her PICC line tracheostomy.   Significant Events  3/20 Admitted after being found unresponsive, intubated on ED arrival  CT angio chest >> atherosclerosis, severe coronary calcification, severe LVH, mild centrilobular emphysema, 2 mm nodule LUL, 4 mm nodule LUL Blood culture  >> Haemophilus influenzae 3/22 Had oxygen desaturation and low Vt with SBT this morning. Echo 06/14/22 >> EF 60 to 65%, mod LVH, grade 1 DD, mild/mod AR 3/25 bronch for peak pressures, low tidal volumes with mucus cast at end of ETT, thick secretions caking tube 3/29 Bronched again with purulent secretions LLL with plugging and LLL atelectasis, some increase in O2 requirement on vent, zosyn started for VAP coverage. MRSA nare swab today negative.  4/2 family meeting, made DNR, plans for one way extubation 4/5 4/6-family changes mind regarding one-way extubation, plan will be for tracheostomy 4/8 trach placed 4/18 Cortrak clogged and thus replaced. Still working on weaning completed CTx for PNA. 48 hrs on ATC  4/19 off vent x 72 hours so removed from room.  Planning on changing trach to cuffless.  Continuing to work with PMV.  Hypoglycemia adding D5 to maintenance fluids 4/26: Patient pulled tracheostomy tube.  4/28: Patient pulled PICC line, PIV inserted, Soft restraints.  Assessment & Plan:   Principal  Problem:   Acute respiratory failure with hypoxia and hypercapnia (HCC) Active Problems:   COPD with exacerbation (HCC)   Hyperglycemia   Acute metabolic encephalopathy   HTN (hypertension)   Tobacco abuse   Anxiety   Peripheral vascular disease (HCC)   Pulmonary nodules   Dementia (HCC)   Protein-calorie malnutrition, severe (HCC)   Urinary retention   Hypervolemia   Encephalopathy acute   Tracheostomy in place [facility])   AKI (acute kidney injury) (HCC)   Hypernatremia   Fever   Acute respiratory failure with hypercapnia (HCC)   Hyponatremia   Pressure injury of skin   Acute hypoxic/hypercapnic respiratory failure: Complicated hospital course w/ intubation, tracheostomy placement>she pulled out trach on 4/26.  Fortunately doing well without tracheostomy, site is healed.   Currently on room air. Cont Yupelri, Brovana Pulmicort  Nebs, and bronchodilator PRN. Should be able to switch her over to inhaler treatment at discharge with [facility] and [facility]. She has completed a 5-day course of doxycycline. PT consulted at the request of daughter Labs 5/20? refused   Acute metabolic encephalopathy in the background of dementia Based on previous reports it looks like at baseline patient has regular conversations with herself and wanders out of the house.    Presented with unresponsiveness s/p extensive workup including  head CT negative for acute abnormality. Felt to be due to respiratory failure and also with ICU delirium.   Patient remains confused.  Patient was seen by psychiatry and started on Seroquel.   Dose of Seroquel increased to 75 mg qhs 5/15 Currently off of benzodiazepines. Patient seems to be stable.  Continue to monitor.   Dysphagia Initially on core track>now tolerating DYS3 diet.  Multiple falls in the hospital  Found on the floor on 5/2  and 5/8.  Underwent imaging studies which did not show any injuries.   Type 2 diabetes mellitus  HbA1c 6.0.  Monitor CBGs.    Acute kidney injury Resolved.    Pulmonary nodules Incidental finding on CT 06/12/2022 - 2 mm and 4 mm noncalcified left upper lobe lung nodules.  May need outpatient surveillance depending on her prognosis and goals of care going forward.   Essential hypertension Well controlled on norvasc and Coreg.   Chronic diastolic CHF  Currently euvolemic   Hypokalemia Resolved    Physical deconditioning PT and OT has not seen her.  SNF is recommended.  Difficult to place.  Social worker is following.  Family looking into assisted living facilities/memory care unit   Severe malnutrition  Encourage oral intake.  On Ensure.  Stage 1 sacral decub-wound consulted Advised her and the staff to alternate positions every 2 hours   Nutrition Problem: Severe Malnutrition Etiology: chronic illness (COPD, dementia)  Signs/Symptoms: severe fat depletion, severe muscle depletion  Interventions: Ensure Enlive (each supplement provides 350kcal and 20 grams of protein), Juven  Estimated body mass index is 15.93 kg/m as calculated from the following:   Height as of this encounter: 5' (1.524 m).   Weight as of this encounter: 37 kg.  DVT prophylaxis: lovenox Code Status:dnr Family Communication: none Disposition Plan:  Status is: Inpatient Remains inpatient appropriate because: medically stable for dc   Consultants:  Pulm psych  Subjective: Complains of pain in the sacral area   Objective: Vitals:   08/12/22 0500 08/12/22 0608 08/12/22 0751 08/12/22 0807  BP:  (!) 142/71  129/77  Pulse:  98  84  Resp:  16  18  Temp:  97.6 F (36.4 C)  97.8 F (36.6 C)  TempSrc:      SpO2:  96% 95% 99%  Weight: 37 kg     Height:        Intake/Output Summary (Last 24 hours) at 08/12/2022 1034 Last data filed at 08/12/2022 1007 Gross per 24 hour  Intake 1760 ml  Output --  Net 1760 ml    Filed Weights   08/10/22 0500 08/11/22 0500 08/12/22 0500  Weight: 41.2 kg 37.9 kg 37 kg     Examination:  General exam: Appears calm and comfortable  Respiratory system: Clear to auscultation. Respiratory effort normal. Cardiovascular system: S1 & S2 heard, RRR. No JVD, murmurs, rubs, gallops or clicks. No pedal edema. Gastrointestinal system: Abdomen is nondistended, soft and nontender. No organomegaly or masses felt. Normal bowel sounds heard. Central nervous system: awake Extremities: no edema   Data Reviewed: I have personally reviewed following labs and imaging studies  CBC: No results for input(s): "WBC", "NEUTROABS", "HGB", "HCT", "MCV", "PLT" in the last 168 hours.  Basic Metabolic Panel: Recent Labs  Lab 08/06/22 0235  NA 137  K 3.8  CL 104  CO2 23  GLUCOSE 111*  BUN 35*  CREATININE 0.87  CALCIUM 9.6    GFR: Estimated Creatinine Clearance: 31.6 mL/min (by C-G formula based on SCr of 0.87 mg/dL). Liver Function Tests: No results for input(s): "AST", "ALT", "ALKPHOS", "BILITOT", "PROT", "ALBUMIN" in the last 168 hours. No results for input(s): "LIPASE", "AMYLASE" in the last 168 hours. No results for input(s): "AMMONIA" in the last 168 hours. Coagulation Profile: No results for input(s): "INR", "PROTIME" in the last 168 hours. Cardiac Enzymes: No results for input(s): "CKTOTAL", "CKMB", "CKMBINDEX", "TROPONINI" in the last  168 hours. BNP (last 3 results) No results for input(s): "PROBNP" in the last 8760 hours. HbA1C: No results for input(s): "HGBA1C" in the last 72 hours. CBG: No results for input(s): "GLUCAP" in the last 168 hours. Lipid Profile: No results for input(s): "CHOL", "HDL", "LDLCALC", "TRIG", "CHOLHDL", "LDLDIRECT" in the last 72 hours. Thyroid Function Tests: No results for input(s): "TSH", "T4TOTAL", "FREET4", "T3FREE", "THYROIDAB" in the last 72 hours. Anemia Panel: No results for input(s): "VITAMINB12", "FOLATE", "FERRITIN", "TIBC", "IRON", "RETICCTPCT" in the last 72 hours. Sepsis Labs: No results for input(s):  "PROCALCITON", "LATICACIDVEN" in the last 168 hours.  No results found for this or any previous visit (from the past 240 hour(s)).       Radiology Studies: No results found.      Scheduled Meds:  amLODipine  5 mg Oral Daily   arformoterol  15 mcg Nebulization BID   budesonide (PULMICORT) nebulizer solution  0.5 mg Nebulization BID   carvedilol  3.125 mg Oral BID WC   enoxaparin (LOVENOX) injection  30 mg Subcutaneous Daily   feeding supplement  237 mL Oral BID BM   mupirocin cream   Topical BID   nicotine  21 mg Transdermal Daily   nutrition supplement (JUVEN)  1 packet Oral BID BM   mouth rinse  15 mL Mouth Rinse 4 times per day   pantoprazole  40 mg Oral Daily   QUEtiapine  25 mg Oral BID   QUEtiapine  75 mg Oral QHS   revefenacin  175 mcg Nebulization Daily   Continuous Infusions:   LOS: 61 days    Time spent: 20 min  Alwyn Ren, MD 08/12/2022, 10:34 AM

## 2022-08-13 NOTE — Progress Notes (Addendum)
PROGRESS NOTE    Carla [name]  ZOX:[id number] DOB: January 27, 1946 DOA: 06/12/2022 PCP: Patient, [name]  Brief Narrative: 34 yof w/ COPD/emphysema tobacco use, Hypertension, Dementia, Anxiety, Depression, GERD, Primary hyperparathyroidism, Vit D deficiency, HLD, Osteoporosis, found by family unresponsive and brought to ER on 06/12/22 and found to have acute on chronic hypercapnia on ABG>Intubated in ER and treated for COPD exacerbation. She had prolonged complicated hospitalization charted as below with multiple ICU admissions, needing tracheostomy placement, having encephalopathy with baseline dementia and pulling out her PICC line tracheostomy.   Significant Events  3/20 Admitted after being found unresponsive, intubated on ED arrival  CT angio chest >> atherosclerosis, severe coronary calcification, severe LVH, mild centrilobular emphysema, 2 mm nodule LUL, 4 mm nodule LUL Blood culture  >> Haemophilus influenzae 3/22 Had oxygen desaturation and low Vt with SBT this morning. Echo 06/14/22 >> EF 60 to 65%, mod LVH, grade 1 DD, mild/mod AR 3/25 bronch for peak pressures, low tidal volumes with mucus cast at end of ETT, thick secretions caking tube 3/29 Bronched again with purulent secretions LLL with plugging and LLL atelectasis, some increase in O2 requirement on vent, zosyn started for VAP coverage. MRSA nare swab today negative.  4/2 family meeting, made DNR, plans for one way extubation 4/5 4/6-family changes mind regarding one-way extubation, plan will be for tracheostomy 4/8 trach placed 4/18 Cortrak clogged and thus replaced. Still working on weaning completed CTx for PNA. 48 hrs on ATC  4/19 off vent x 72 hours so removed from room.  Planning on changing trach to cuffless.  Continuing to work with PMV.  Hypoglycemia adding D5 to maintenance fluids 4/26: Patient pulled tracheostomy tube.  4/28: Patient pulled PICC line, PIV inserted, Soft restraints.  Assessment & Plan:   Principal  Problem:   Acute respiratory failure with hypoxia and hypercapnia (HCC) Active Problems:   COPD with exacerbation (HCC)   Hyperglycemia   Acute metabolic encephalopathy   HTN (hypertension)   Tobacco abuse   Anxiety   Peripheral vascular disease (HCC)   Pulmonary nodules   Dementia (HCC)   Protein-calorie malnutrition, severe (HCC)   Urinary retention   Hypervolemia   Encephalopathy acute   Tracheostomy in place [facility])   AKI (acute kidney injury) (HCC)   Hypernatremia   Fever   Acute respiratory failure with hypercapnia (HCC)   Hyponatremia   Pressure injury of skin   Acute hypoxic/hypercapnic respiratory failure: Complicated hospital course w/ intubation, tracheostomy placement>she pulled out trach on 4/26.  Fortunately doing well without tracheostomy, site is healed.   Currently on room air. Cont Yupelri, Brovana Pulmicort  Nebs, and bronchodilator PRN. Should be able to switch her over to inhaler treatment at discharge with [facility] and [facility]. She has completed a 5-day course of doxycycline. PT consulted at the request of daughter Labs 5/20? refused   Acute metabolic encephalopathy in the background of dementia Based on previous reports it looks like at baseline patient has regular conversations with herself and wanders out of the house.    Presented with unresponsiveness s/p extensive workup including  head CT negative for acute abnormality. Felt to be due to respiratory failure and also with ICU delirium.   Patient remains confused.  Patient was seen by psychiatry and started on Seroquel.   Dose of Seroquel increased to 75 mg qhs 5/15 Currently off of benzodiazepines. Patient seems to be stable.  Continue to monitor.   Dysphagia Initially on core track>now tolerating DYS3 diet.  Multiple falls in the hospital  Found on the floor on 5/2  and 5/8.  Underwent imaging studies which did not show any injuries.   Type 2 diabetes mellitus  HbA1c 6.0.  Monitor CBGs.    Acute kidney injury Resolved.    Pulmonary nodules Incidental finding on CT 06/12/2022 - 2 mm and 4 mm noncalcified left upper lobe lung nodules.  May need outpatient surveillance depending on her prognosis and goals of care going forward.   Essential hypertension-continue Norvasc 5 mg daily with Coreg 3.125 mg twice a day   Chronic diastolic CHF  Currently euvolemic   Hypokalemia Resolved    Physical deconditioning PT and OT has not seen her.  SNF is recommended.  Difficult to place.  Social worker is following.  Family looking into assisted living facilities/memory care unit   Severe malnutrition  Encourage oral intake.  On Ensure.  Stage 1 sacral decub-wound consulted alternate positions every 2 hours   Nutrition Problem: Severe Malnutrition Etiology: chronic illness (COPD, dementia)  Signs/Symptoms: severe fat depletion, severe muscle depletion  Interventions: Ensure Enlive (each supplement provides 350kcal and 20 grams of protein), Juven  Estimated body mass index is 15.93 kg/m as calculated from the following:   Height as of this encounter: 5' (1.524 m).   Weight as of this encounter: 37 kg.  DVT prophylaxis: lovenox Code Status:dnr Family Communication: none Disposition Plan:  Status is: Inpatient Remains inpatient appropriate because: medically stable for dc   Consultants:  Pulm psych  Subjective: No c/o  Waiting for food  Objective: Vitals:   08/12/22 2003 08/13/22 0630 08/13/22 0713 08/13/22 0722  BP:  (!) 150/72 (!) 163/72   Pulse:  82 78   Resp:  18 18   Temp:  (!) 97.3 F (36.3 C) 97.6 F (36.4 C)   TempSrc:  Oral    SpO2: 98% 95% 93% 93%  Weight:      Height:        Intake/Output Summary (Last 24 hours) at 08/13/2022 1042 Last data filed at 08/13/2022 0815 Gross per 24 hour  Intake 987 ml  Output --  Net 987 ml    Filed Weights   08/10/22 0500 08/11/22 0500 08/12/22 0500  Weight: 41.2 kg 37.9 kg 37 kg     Examination:  General exam: Appears calm and comfortable  Respiratory system: Clear to auscultation. Respiratory effort normal. Cardiovascular system: S1 & S2 heard, RRR. No JVD, murmurs, rubs, gallops or clicks. No pedal edema. Gastrointestinal system: Abdomen is nondistended, soft and nontender. No organomegaly or masses felt. Normal bowel sounds heard. Central nervous system: awake Extremities: no edema   Data Reviewed: I have personally reviewed following labs and imaging studies  CBC: No results for input(s): "WBC", "NEUTROABS", "HGB", "HCT", "MCV", "PLT" in the last 168 hours.  Basic Metabolic Panel: No results for input(s): "NA", "K", "CL", "CO2", "GLUCOSE", "BUN", "CREATININE", "CALCIUM", "MG", "PHOS" in the last 168 hours.  GFR: Estimated Creatinine Clearance: 31.6 mL/min (by C-G formula based on SCr of 0.87 mg/dL). Liver Function Tests: No results for input(s): "AST", "ALT", "ALKPHOS", "BILITOT", "PROT", "ALBUMIN" in the last 168 hours. No results for input(s): "LIPASE", "AMYLASE" in the last 168 hours. No results for input(s): "AMMONIA" in the last 168 hours. Coagulation Profile: No results for input(s): "INR", "PROTIME" in the last 168 hours. Cardiac Enzymes: No results for input(s): "CKTOTAL", "CKMB", "CKMBINDEX", "TROPONINI" in the last 168 hours. BNP (last 3 results) No results for input(s): "PROBNP" in the last 8760  hours. HbA1C: No results for input(s): "HGBA1C" in the last 72 hours. CBG: No results for input(s): "GLUCAP" in the last 168 hours. Lipid Profile: No results for input(s): "CHOL", "HDL", "LDLCALC", "TRIG", "CHOLHDL", "LDLDIRECT" in the last 72 hours. Thyroid Function Tests: No results for input(s): "TSH", "T4TOTAL", "FREET4", "T3FREE", "THYROIDAB" in the last 72 hours. Anemia Panel: No results for input(s): "VITAMINB12", "FOLATE", "FERRITIN", "TIBC", "IRON", "RETICCTPCT" in the last 72 hours. Sepsis Labs: No results for input(s):  "PROCALCITON", "LATICACIDVEN" in the last 168 hours.  No results found for this or any previous visit (from the past 240 hour(s)).   Radiology Studies: No results found.  Scheduled Meds:  amLODipine  5 mg Oral Daily   arformoterol  15 mcg Nebulization BID   budesonide (PULMICORT) nebulizer solution  0.5 mg Nebulization BID   carvedilol  3.125 mg Oral BID WC   enoxaparin (LOVENOX) injection  30 mg Subcutaneous Daily   feeding supplement  1 Container Oral BID BM   leptospermum manuka honey  1 Application Topical Daily   mupirocin cream   Topical BID   nicotine  21 mg Transdermal Daily   nutrition supplement (JUVEN)  1 packet Oral BID BM   mouth rinse  15 mL Mouth Rinse 4 times per day   pantoprazole  40 mg Oral Daily   QUEtiapine  25 mg Oral BID   QUEtiapine  75 mg Oral QHS   revefenacin  175 mcg Nebulization Daily   Continuous Infusions:   LOS: 62 days    Time spent: 20 min  Alwyn Ren, MD 08/13/2022, 10:42 AM

## 2022-08-13 NOTE — Progress Notes (Incomplete Revision)
PT Cancellation Note  Patient Details Name: Carla [name] MRN: [id number] DOB: 10-22-1945   Cancelled Treatment:  PT attempted x 2 this AM.  Initially, pt states "not feeling well" and does not want to participate.  PT returned in late AM for 2nd attempt.  Pt sleeping and awakened w/ some difficulty and pt cursing at PT to get out of the room "I don't want to do anything with you."        Lucio Edward 08/13/2022, 10:51 AM

## 2022-08-13 NOTE — Progress Notes (Addendum)
PT Cancellation Note  Patient Details Name: Carla [name] MRN: [id number] DOB: 10-22-1945   Cancelled Treatment:  PT attempted x 2 this AM.  Initially, pt states "not feeling well" and does not want to participate.  PT returned in late AM for 2nd attempt.  Pt sleeping and awakened w/ some difficulty and pt cursing at PT to get out of the room "I don't want to do anything with you."        Lucio Edward 08/13/2022, 10:51 AM

## 2022-08-14 DIAGNOSIS — Z720 Tobacco use: Secondary | ICD-10-CM | POA: Diagnosis not present

## 2022-08-14 DIAGNOSIS — J9602 Acute respiratory failure with hypercapnia: Secondary | ICD-10-CM | POA: Diagnosis not present

## 2022-08-14 DIAGNOSIS — J9601 Acute respiratory failure with hypoxia: Secondary | ICD-10-CM | POA: Diagnosis not present

## 2022-08-14 DIAGNOSIS — J441 Chronic obstructive pulmonary disease with (acute) exacerbation: Secondary | ICD-10-CM | POA: Diagnosis not present

## 2022-08-14 DIAGNOSIS — I739 Peripheral vascular disease, unspecified: Secondary | ICD-10-CM

## 2022-08-14 DIAGNOSIS — I10 Essential (primary) hypertension: Secondary | ICD-10-CM | POA: Diagnosis not present

## 2022-08-14 DIAGNOSIS — G9340 Encephalopathy, unspecified: Secondary | ICD-10-CM | POA: Diagnosis not present

## 2022-08-14 DIAGNOSIS — N179 Acute kidney failure, unspecified: Secondary | ICD-10-CM | POA: Diagnosis not present

## 2022-08-14 DIAGNOSIS — E43 Unspecified severe protein-calorie malnutrition: Secondary | ICD-10-CM | POA: Diagnosis not present

## 2022-08-14 DIAGNOSIS — F419 Anxiety disorder, unspecified: Secondary | ICD-10-CM | POA: Diagnosis not present

## 2022-08-14 DIAGNOSIS — F03918 Unspecified dementia, unspecified severity, with other behavioral disturbance: Secondary | ICD-10-CM | POA: Diagnosis not present

## 2022-08-14 DIAGNOSIS — R918 Other nonspecific abnormal finding of lung field: Secondary | ICD-10-CM

## 2022-08-14 NOTE — Progress Notes (Addendum)
PT Cancellation Note  Patient Details Name: Carla [name] MRN: [id number] DOB: [birth year]   Cancelled Treatment:    Reason Eval/Treat Not Completed: Patient declined, no reason specified. Per chart, pt agitated/irritable yesterday and overnight. Pt remains irritable this morning. Declining participation in therapy, stating "not today but maybe tomorrow."   Ilda Foil 08/14/2022, 11:49 AM

## 2022-08-14 NOTE — Progress Notes (Addendum)
PROGRESS NOTE  Carla [name] WUJ:[id number] DOB: [birth year]   PCP: Patient, [name]  Patient is from:   DOA: 06/12/2022 LOS: 63  Chief complaints Chief Complaint  Patient presents with   Altered Mental Status     Brief Narrative / Interim history: 12 yof w/ COPD/emphysema tobacco use, Hypertension, Dementia, Anxiety, Depression, GERD, Primary hyperparathyroidism, Vit D deficiency, HLD, Osteoporosis, found by family unresponsive and brought to ER on 06/12/22 and found to have acute on chronic hypercapnia on ABG>Intubated in ER and treated for COPD exacerbation. She had prolonged complicated hospitalization charted as below with multiple ICU admissions, needing tracheostomy placement, having encephalopathy with baseline dementia and pulling out her PICC line tracheostomy.   Significant Events  3/20 Admitted after being found unresponsive, intubated on ED arrival  CT angio chest >> atherosclerosis, severe coronary calcification, severe LVH, mild centrilobular emphysema, 2 mm nodule LUL, 4 mm nodule LUL Blood culture  >> Haemophilus influenzae 3/22 Had oxygen desaturation and low Vt with SBT this morning. Echo 06/14/22 >> EF 60 to 65%, mod LVH, grade 1 DD, mild/mod AR 3/25 bronch for peak pressures, low tidal volumes with mucus cast at end of ETT, thick secretions caking tube 3/29 Bronched again with purulent secretions LLL with plugging and LLL atelectasis, some increase in O2 requirement on vent, zosyn started for VAP coverage. MRSA nare swab today negative.  4/2 family meeting, made DNR, plans for one way extubation 4/5 4/6-family changes mind regarding one-way extubation, plan will be for tracheostomy 4/8 trach placed 4/18 Cortrak clogged and thus replaced. Still working on weaning completed CTx for PNA. 48 hrs on ATC  4/19 off vent x 72 hours so removed from room.  Planning on changing trach to cuffless.  Continuing to work with PMV.  Hypoglycemia adding D5 to maintenance  fluids 4/26: Patient pulled tracheostomy tube.  4/28: Patient pulled PICC line, PIV inserted, Soft restraints.   Subjective: Seen and examined earlier this morning.  She was agitated and combative overnight requiring Recruitment consultant.  She is sleepy but wakes to voice.  She is oriented to self and follows command but not place or time.  Responds no to pain.   Objective: Vitals:   08/13/22 1938 08/13/22 1942 08/14/22 0715 08/14/22 0900  BP:   (!) 149/73   Pulse:   76   Resp:   20   Temp:   97.8 F (36.6 C)   TempSrc:   Oral   SpO2: 93% 93% 96% 96%  Weight:      Height:        Examination:  GENERAL: No apparent distress.  Nontoxic. HEENT: MMM.  Vision and hearing grossly intact.  NECK: Supple.  No apparent JVD.  RESP:  No IWOB.  Fair aeration bilaterally. CVS:  RRR. Heart sounds normal.  ABD/GI/GU: BS+. Abd soft, NTND.  MSK/EXT:  Moves extremities. No apparent deformity. No edema.  SKIN: no apparent skin lesion or wound NEURO: Awake, alert and oriented to self.  Follows commands.  No apparent focal neuro deficit. PSYCH: Calm. Normal affect.    Microbiology summarized: COVID-19, influenza and RSV PCR nonreactive MRSA PCR screen nonreactive Urine culture with pansensitive E. Coli Blood culture with haemophilus influenza in 1 out of 2 bottles Repeat blood cultures negative Respiratory cultures with normal respiratory flora  Assessment and plan: Principal Problem:   Acute respiratory failure with hypoxia and hypercapnia (HCC) Active Problems:   COPD with exacerbation (HCC)   Hyperglycemia   Acute metabolic encephalopathy  HTN (hypertension)   Tobacco abuse   Anxiety   Peripheral vascular disease (HCC)   Pulmonary nodules   Dementia (HCC)   Protein-calorie malnutrition, severe (HCC)   Urinary retention   Hypervolemia   Encephalopathy acute   Tracheostomy in place [facility])   AKI (acute kidney injury) (HCC)   Hypernatremia   Fever   Acute respiratory failure with  hypercapnia (HCC)   Hyponatremia   Pressure injury of skin   Acute hypoxic/hypercapnic respiratory failure: Intubation and mechanical ventilation from 3/20-4/5. Tracheostomy on 4/6. Pulled out trach on 4/26.  Trach site healed.  Currently on room air. -Cont Yupelri, Brovana Pulmicort  Nebs, and bronchodilator PRN.   Acute metabolic encephalopathy in the background of dementia: based on previous reports it looks like at baseline patient has regular conversations with herself and wanders out of the house.   Presented with unresponsiveness s/p extensive workup including  head CT negative for acute abnormality. Felt to be due to respiratory failure and also with ICU delirium.   Patient remains confused.  Patient was seen by psychiatry and started on Seroquel.  Currently off benzos. -Dose of Seroquel increased to 75 mg qhs 5/15 -Reorientation and delirium precaution   Dysphagia-advanced to dysphagia 3 diet. -Continue dysphagia 3 diet   Multiple falls in the hospital :found on the floor on 5/2  and 5/8.  Underwent imaging studies which did not show any injuries. -Fall precaution   Acute kidney injury: Resolved Resolved.    Pulmonary nodules: Incidental finding on CT 06/12/2022 - 2 mm and 4 mm noncalcified LUL lung nodules.   -May need outpatient surveillance depending on her prognosis and goals of care going forward.   Essential hypertension -continue Norvasc 5 mg daily with Coreg 3.125 mg twice a day   Chronic diastolic CHF: Currently euvolemic   Hypokalemia: Resolved   Physical deconditioning: PT and OT has not seen her.  SNF is recommended.  Difficult to place.  Social worker is following.  Family looking into assisted living facilities/memory care unit   Severe Malnutrition Body mass index is 15.93 kg/m. Nutrition Problem: Severe Malnutrition Etiology: chronic illness (COPD, dementia) Signs/Symptoms: severe fat depletion, severe muscle depletion Interventions: Ensure Enlive (each  supplement provides 350kcal and 20 grams of protein), Juven   DVT prophylaxis:  enoxaparin (LOVENOX) injection 30 mg Start: 07/02/22 1000  Code Status: DNR/DNI Family Communication: None at bedside Level of care: Med-Surg Status is: Inpatient Remains inpatient appropriate because: Placement   Final disposition: LOS/memory care? Consultants:  Pulmonology Psychiatry  35 minutes with more than 50% spent in reviewing records, counseling patient/family and coordinating care.   Sch Meds:  Scheduled Meds:  amLODipine  5 mg Oral Daily   arformoterol  15 mcg Nebulization BID   budesonide (PULMICORT) nebulizer solution  0.5 mg Nebulization BID   carvedilol  3.125 mg Oral BID WC   enoxaparin (LOVENOX) injection  30 mg Subcutaneous Daily   feeding supplement  1 Container Oral BID BM   leptospermum manuka honey  1 Application Topical Daily   mupirocin cream   Topical BID   nicotine  21 mg Transdermal Daily   nutrition supplement (JUVEN)  1 packet Oral BID BM   mouth rinse  15 mL Mouth Rinse 4 times per day   pantoprazole  40 mg Oral Daily   QUEtiapine  25 mg Oral BID   QUEtiapine  75 mg Oral QHS   revefenacin  175 mcg Nebulization Daily   Continuous Infusions: PRN Meds:.acetaminophen **OR** acetaminophen,  albuterol, haloperidol lactate, lip balm, ondansetron **OR** ondansetron (ZOFRAN) IV, mouth rinse, polyethylene glycol, senna  Antimicrobials: Anti-infectives (From admission, onward)    Start     Dose/Rate Route Frequency Ordered Stop   07/31/22 1030  doxycycline (VIBRA-TABS) tablet 100 mg        100 mg Oral Every 12 hours 07/31/22 0932 08/05/22 0959   07/06/22 1000  cefTRIAXone (ROCEPHIN) 2 g in sodium chloride 0.9 % 100 mL IVPB        2 g 200 mL/hr over 30 Minutes Intravenous Every 24 hours 07/06/22 0848 07/08/22 0952   07/05/22 0200  piperacillin-tazobactam (ZOSYN) IVPB 3.375 g  Status:  Discontinued        3.375 g 12.5 mL/hr over 240 Minutes Intravenous Every 8 hours  07/04/22 1826 07/06/22 0848   07/04/22 1915  vancomycin (VANCOCIN) IVPB 1000 mg/200 mL premix  Status:  Discontinued        1,000 mg 200 mL/hr over 60 Minutes Intravenous Every 48 hours 07/04/22 1826 07/05/22 0827   07/04/22 1915  piperacillin-tazobactam (ZOSYN) IVPB 3.375 g        3.375 g 100 mL/hr over 30 Minutes Intravenous  Once 07/04/22 1826 07/04/22 1914   06/21/22 2300  piperacillin-tazobactam (ZOSYN) IVPB 3.375 g        3.375 g 12.5 mL/hr over 240 Minutes Intravenous Every 8 hours 06/21/22 1631 06/29/22 2009   06/21/22 1645  piperacillin-tazobactam (ZOSYN) IVPB 3.375 g  Status:  Discontinued        3.375 g 12.5 mL/hr over 240 Minutes Intravenous Every 8 hours 06/21/22 1630 06/21/22 1631   06/21/22 1645  piperacillin-tazobactam (ZOSYN) IVPB 3.375 g        3.375 g 100 mL/hr over 30 Minutes Intravenous STAT 06/21/22 1631 06/21/22 1900   06/14/22 1315  cefTRIAXone (ROCEPHIN) 2 g in sodium chloride 0.9 % 100 mL IVPB  Status:  Discontinued        2 g 200 mL/hr over 30 Minutes Intravenous Every 24 hours 06/14/22 1249 06/20/22 1415   06/12/22 2230  doxycycline (VIBRAMYCIN) 100 mg in sodium chloride 0.9 % 250 mL IVPB  Status:  Discontinued        100 mg 125 mL/hr over 120 Minutes Intravenous Every 12 hours 06/12/22 2139 06/14/22 1350        I have personally reviewed the following labs and images: CBC: No results for input(s): "WBC", "NEUTROABS", "HGB", "HCT", "MCV", "PLT" in the last 168 hours. BMP &GFR No results for input(s): "NA", "K", "CL", "CO2", "GLUCOSE", "BUN", "CREATININE", "CALCIUM", "MG", "PHOS" in the last 168 hours.  Invalid input(s): "GFRCG" Estimated Creatinine Clearance: 31.6 mL/min (by C-G formula based on SCr of 0.87 mg/dL). Liver & Pancreas: No results for input(s): "AST", "ALT", "ALKPHOS", "BILITOT", "PROT", "ALBUMIN" in the last 168 hours. No results for input(s): "LIPASE", "AMYLASE" in the last 168 hours. No results for input(s): "AMMONIA" in the last 168  hours. Diabetic: No results for input(s): "HGBA1C" in the last 72 hours. No results for input(s): "GLUCAP" in the last 168 hours. Cardiac Enzymes: No results for input(s): "CKTOTAL", "CKMB", "CKMBINDEX", "TROPONINI" in the last 168 hours. No results for input(s): "PROBNP" in the last 8760 hours. Coagulation Profile: No results for input(s): "INR", "PROTIME" in the last 168 hours. Thyroid Function Tests: No results for input(s): "TSH", "T4TOTAL", "FREET4", "T3FREE", "THYROIDAB" in the last 72 hours. Lipid Profile: No results for input(s): "CHOL", "HDL", "LDLCALC", "TRIG", "CHOLHDL", "LDLDIRECT" in the last 72 hours. Anemia Panel: No results for input(s): "  VITAMINB12", "FOLATE", "FERRITIN", "TIBC", "IRON", "RETICCTPCT" in the last 72 hours. Urine analysis:    Component Value Date/Time   COLORURINE YELLOW 07/04/2022 1612   APPEARANCEUR CLOUDY (A) 07/04/2022 1612   LABSPEC 1.014 07/04/2022 1612   PHURINE 8.0 07/04/2022 1612   GLUCOSEU NEGATIVE 07/04/2022 1612   HGBUR NEGATIVE 07/04/2022 1612   BILIRUBINUR NEGATIVE 07/04/2022 1612   KETONESUR NEGATIVE 07/04/2022 1612   PROTEINUR 30 (A) 07/04/2022 1612   NITRITE NEGATIVE 07/04/2022 1612   LEUKOCYTESUR LARGE (A) 07/04/2022 1612   Sepsis Labs: Invalid input(s): "PROCALCITONIN", "LACTICIDVEN"  Microbiology: No results found for this or any previous visit (from the past 240 hour(s)).  Radiology Studies: No results found.    Rakeem Colley T. Eniola Carla [name] [facility]  If 7PM-7AM, please contact night-coverage [email or web address] 08/14/2022, 12:56 PM

## 2022-08-15 DIAGNOSIS — G9340 Encephalopathy, unspecified: Secondary | ICD-10-CM | POA: Diagnosis not present

## 2022-08-15 DIAGNOSIS — E43 Unspecified severe protein-calorie malnutrition: Secondary | ICD-10-CM | POA: Diagnosis not present

## 2022-08-15 DIAGNOSIS — R918 Other nonspecific abnormal finding of lung field: Secondary | ICD-10-CM | POA: Diagnosis not present

## 2022-08-15 DIAGNOSIS — J441 Chronic obstructive pulmonary disease with (acute) exacerbation: Secondary | ICD-10-CM | POA: Diagnosis not present

## 2022-08-15 DIAGNOSIS — J9601 Acute respiratory failure with hypoxia: Secondary | ICD-10-CM | POA: Diagnosis not present

## 2022-08-15 DIAGNOSIS — I739 Peripheral vascular disease, unspecified: Secondary | ICD-10-CM | POA: Diagnosis not present

## 2022-08-15 DIAGNOSIS — F03918 Unspecified dementia, unspecified severity, with other behavioral disturbance: Secondary | ICD-10-CM | POA: Diagnosis not present

## 2022-08-15 DIAGNOSIS — I10 Essential (primary) hypertension: Secondary | ICD-10-CM | POA: Diagnosis not present

## 2022-08-15 DIAGNOSIS — Z720 Tobacco use: Secondary | ICD-10-CM | POA: Diagnosis not present

## 2022-08-15 DIAGNOSIS — N179 Acute kidney failure, unspecified: Secondary | ICD-10-CM | POA: Diagnosis not present

## 2022-08-15 DIAGNOSIS — F419 Anxiety disorder, unspecified: Secondary | ICD-10-CM | POA: Diagnosis not present

## 2022-08-15 DIAGNOSIS — J9602 Acute respiratory failure with hypercapnia: Secondary | ICD-10-CM | POA: Diagnosis not present

## 2022-08-15 NOTE — Progress Notes (Addendum)
PROGRESS NOTE  Carla [name] RUE:[id number] DOB: 22-Jun-1945   PCP: No primary care provider on file.  Patient is from:   DOA: 06/12/2022 LOS: 78  Chief complaints Chief Complaint  Patient presents with   Altered Mental Status     Brief Narrative / Interim history: 65 yof w/ COPD/emphysema tobacco use, Hypertension, Dementia, Anxiety, Depression, GERD, Primary hyperparathyroidism, Vit D deficiency, HLD, Osteoporosis, found by family unresponsive and brought to ER on 06/12/22 and found to have acute on chronic hypercapnia on ABG>Intubated in ER and treated for COPD exacerbation. She had prolonged complicated hospitalization charted as below with multiple ICU admissions, needing tracheostomy placement, having encephalopathy with baseline dementia and pulling out her PICC line tracheostomy.   Significant Events  3/20 Admitted after being found unresponsive, intubated on arrival to ED CT angio chest >> atherosclerosis, severe coronary calcification, severe LVH, mild centrilobular emphysema, 2 mm nodule LUL, 4 mm nodule LUL Blood culture  >> Haemophilus influenzae 3/22 Echo 06/14/22 >> EF 60 to 65%, mod LVH, grade 1 DD, mild/mod AR 3/25 bronch for peak pressures, low tidal volumes with mucus cast at end of ETT, thick secretions caking tube 3/29 started on Zosyn for broad coverage due to purulent secretions LLL with plugging and LLL atelectasis, increased O2 requirement on vent 4/2 family meeting, made DNR, plans for one way extubation 4/5 4/6-family changes mind regarding one-way extubation, plan will be for tracheostomy 4/8 trach placed 4/19 off vent x 72 hours so removed from room.  Planning on changing trach to cuffless.  Continuing to work with PMV.  Hypoglycemia adding D5 to maintenance fluids 4/26: Patient pulled tracheostomy tube but remained stable.  4/28: Patient pulled PICC line, PIV inserted, Soft restraints.   Subjective: Seen and examined earlier this morning.  No major  events overnight of this morning.  She is awake and alert but only oriented to self.  She follows commands.  Denies pain or shortness of breath.  Feels hungry although she had breakfast.   Objective: Vitals:   08/28/22 2049 08/29/22 0503 08/29/22 0743 08/29/22 0819  BP:  (!) 121/58  (!) 157/69  Pulse:  71  78  Resp:  18  18  Temp:  (!) 97.2 F (36.2 C)  97.6 F (36.4 C)  TempSrc:  Oral  Oral  SpO2: 94% (!) 87% 91% 91%  Weight:      Height:        Examination:  GENERAL: No apparent distress.  Nontoxic. HEENT: MMM.  Vision and hearing grossly intact.  NECK: Supple.  No apparent JVD.  RESP:  No IWOB.  Fair aeration bilaterally. CVS:  RRR. Heart sounds normal.  ABD/GI/GU: BS+. Abd soft, NTND.  MSK/EXT:  Moves extremities. No apparent deformity. No edema.  SKIN: no apparent skin lesion or wound NEURO: Awake, alert and oriented to self.  Follows commands.  No apparent focal neuro deficit. PSYCH: Calm. Normal affect.    Microbiology summarized: COVID-19, influenza and RSV PCR nonreactive MRSA PCR screen nonreactive Urine culture with pansensitive E. Coli Blood culture with haemophilus influenza in 1 out of 2 bottles Repeat blood cultures negative Respiratory cultures with normal respiratory flora  Assessment and plan: Active Problems:   * No active hospital problems. *   Acute hypoxic/hypercapnic respiratory failure: Intubation and mechanical ventilation from 3/20-4/5. Tracheostomy on 4/6. Pulled out trach on 4/26.  Trach site healed.  Currently on room air. -Cont Yupelri, Brovana Pulmicort  Nebs, and bronchodilator PRN.   Acute metabolic encephalopathy in the  background of dementia: based on previous reports it looks like at baseline patient has regular conversations with herself and wanders out of the house.   Presented with unresponsiveness s/p extensive workup including  head CT negative for acute abnormality. Felt to be due to respiratory failure and also with ICU  delirium.  Remains confused/disoriented.  Patient was seen by psychiatry and started on Seroquel.  Currently off benzos. -Dose of Seroquel increased to 75 mg qhs 5/15 -Reorientation and delirium precaution   Dysphagia-advanced to dysphagia 3 diet. -Continue dysphagia 3 diet   Multiple falls in the hospital :found on the floor on 5/2  and 5/8.  No serious injury.  Negative imaging. -Fall precaution  Pulmonary nodules: Incidental finding on CT 06/12/2022 - 2 mm and 4 mm noncalcified LUL lung nodules.   -May need outpatient surveillance depending on her prognosis and goals of care going forward.  Acute kidney injury: Resolved   Essential hypertension: Normotensive. -continue Norvasc 5 mg daily with Coreg 3.125 mg twice a day   Chronic diastolic CHF: Currently euvolemic   Hypokalemia: Resolved   Physical deconditioning: PT and OT has not seen her.  SNF is recommended.  Difficult to place.  Social worker is following.  Family looking into assisted living facilities/memory care unit   Severe Malnutrition Body mass index is 16.15 kg/m. Nutrition Problem: Severe Malnutrition Etiology: chronic illness (COPD, dementia) Signs/Symptoms: severe fat depletion, severe muscle depletion Interventions: Ensure Enlive (each supplement provides 350kcal and 20 grams of protein), Juven   DVT prophylaxis:    Code Status: DNR/DNI Family Communication: None at bedside Level of care: Med-Surg Status is: Inpatient Remains inpatient appropriate because: Placement   Final disposition: LOS/memory care? Consultants:  Pulmonology Psychiatry  25 minutes with more than 50% spent in reviewing records, counseling patient/family and coordinating care.   Sch Meds:  Scheduled Meds:   Continuous Infusions: PRN Meds:.  Antimicrobials: Anti-infectives (From admission, onward)    Start     Dose/Rate Route Frequency Ordered Stop   07/31/22 1030  doxycycline (VIBRA-TABS) tablet 100 mg  Status:   Discontinued        100 mg Oral Every 12 hours 07/31/22 0932 08/05/22 0959   07/06/22 1000  cefTRIAXone (ROCEPHIN) 2 g in sodium chloride 0.9 % 100 mL IVPB        2 g 200 mL/hr over 30 Minutes Intravenous Every 24 hours 07/06/22 0848 07/08/22 0952   07/05/22 0200  piperacillin-tazobactam (ZOSYN) IVPB 3.375 g  Status:  Discontinued        3.375 g 12.5 mL/hr over 240 Minutes Intravenous Every 8 hours 07/04/22 1826 07/06/22 0848   07/04/22 1915  vancomycin (VANCOCIN) IVPB 1000 mg/200 mL premix  Status:  Discontinued        1,000 mg 200 mL/hr over 60 Minutes Intravenous Every 48 hours 07/04/22 1826 07/05/22 0827   07/04/22 1915  piperacillin-tazobactam (ZOSYN) IVPB 3.375 g        3.375 g 100 mL/hr over 30 Minutes Intravenous  Once 07/04/22 1826 07/04/22 1914   06/21/22 2300  piperacillin-tazobactam (ZOSYN) IVPB 3.375 g        3.375 g 12.5 mL/hr over 240 Minutes Intravenous Every 8 hours 06/21/22 1631 06/29/22 2009   06/21/22 1645  piperacillin-tazobactam (ZOSYN) IVPB 3.375 g  Status:  Discontinued        3.375 g 12.5 mL/hr over 240 Minutes Intravenous Every 8 hours 06/21/22 1630 06/21/22 1631   06/21/22 1645  piperacillin-tazobactam (ZOSYN) IVPB 3.375 g  3.375 g 100 mL/hr over 30 Minutes Intravenous STAT 06/21/22 1631 06/21/22 1900   06/14/22 1315  cefTRIAXone (ROCEPHIN) 2 g in sodium chloride 0.9 % 100 mL IVPB  Status:  Discontinued        2 g 200 mL/hr over 30 Minutes Intravenous Every 24 hours 06/14/22 1249 06/20/22 1415   06/12/22 2230  doxycycline (VIBRAMYCIN) 100 mg in sodium chloride 0.9 % 250 mL IVPB  Status:  Discontinued        100 mg 125 mL/hr over 120 Minutes Intravenous Every 12 hours 06/12/22 2139 06/14/22 1350        I have personally reviewed the following labs and images: CBC: No results for input(s): "WBC", "NEUTROABS", "HGB", "HCT", "MCV", "PLT" in the last 168 hours. BMP &GFR No results for input(s): "NA", "K", "CL", "CO2", "GLUCOSE", "BUN", "CREATININE",  "CALCIUM", "MG", "PHOS" in the last 168 hours.  Invalid input(s): "GFRCG" CrCl cannot be calculated (Patient's most recent lab result is older than the maximum 21 days allowed.). Liver & Pancreas: No results for input(s): "AST", "ALT", "ALKPHOS", "BILITOT", "PROT", "ALBUMIN" in the last 168 hours. No results for input(s): "LIPASE", "AMYLASE" in the last 168 hours. No results for input(s): "AMMONIA" in the last 168 hours. Diabetic: No results for input(s): "HGBA1C" in the last 72 hours. No results for input(s): "GLUCAP" in the last 168 hours. Cardiac Enzymes: No results for input(s): "CKTOTAL", "CKMB", "CKMBINDEX", "TROPONINI" in the last 168 hours. No results for input(s): "PROBNP" in the last 8760 hours. Coagulation Profile: No results for input(s): "INR", "PROTIME" in the last 168 hours. Thyroid Function Tests: No results for input(s): "TSH", "T4TOTAL", "FREET4", "T3FREE", "THYROIDAB" in the last 72 hours. Lipid Profile: No results for input(s): "CHOL", "HDL", "LDLCALC", "TRIG", "CHOLHDL", "LDLDIRECT" in the last 72 hours. Anemia Panel: No results for input(s): "VITAMINB12", "FOLATE", "FERRITIN", "TIBC", "IRON", "RETICCTPCT" in the last 72 hours. Urine analysis:    Component Value Date/Time   COLORURINE YELLOW 07/04/2022 1612   APPEARANCEUR CLOUDY (A) 07/04/2022 1612   LABSPEC 1.014 07/04/2022 1612   PHURINE 8.0 07/04/2022 1612   GLUCOSEU NEGATIVE 07/04/2022 1612   HGBUR NEGATIVE 07/04/2022 1612   BILIRUBINUR NEGATIVE 07/04/2022 1612   BILIRUBINUR negative 08/11/2019 1432   BILIRUBINUR small 06/02/2019 1539   KETONESUR NEGATIVE 07/04/2022 1612   PROTEINUR 30 (A) 07/04/2022 1612   UROBILINOGEN 0.2 08/11/2019 1432   UROBILINOGEN 0.2 07/07/2013 1550   NITRITE NEGATIVE 07/04/2022 1612   LEUKOCYTESUR LARGE (A) 07/04/2022 1612   Sepsis Labs: Invalid input(s): "PROCALCITONIN", "LACTICIDVEN"  Microbiology: No results found for this or any previous visit (from the past 240  hour(s)).  Radiology Studies: No results found.    Azar [place] T. Floy Angert [facility]  If 7PM-7AM, please contact night-coverage [email or web address] 09/10/2022, 6:11 PM

## 2022-08-15 NOTE — Progress Notes (Incomplete Revision)
Physical Therapy Treatment Patient Details Name: Carla [name] MRN: [id number] DOB: [birth year] Today's Date: 08/15/2022   History of Present Illness 77 yo female admitted 3/20 unresponsive from home with acute respiratory failure with hypoxia and hypercapnia and metabolic encephalopathy. Intubated 3/20. trach 4/8. Self decannulated. PMhx: emphysema, tobacco abuse, PVD, HTN, prediabetes, anxiety, dementia    PT Comments    Pt admitted with above diagnosis. Pt was able to ambulate with HHA of 1 with some LOB due to narrow BOS and pt decr safety awareness. Pt needs min assist and cues.  Today focused on getting a soda and would not agree to balance activities. Pt can get agitated quickly and needs redirection at times. Will follow acutely.  Pt currently with functional limitations due to balance and endurance deficits. Pt will benefit from acute skilled PT to increase their independence and safety with mobility to allow discharge.      Recommendations for follow up therapy are one component of a multi-disciplinary discharge planning process, led by the attending physician.  Recommendations may be updated based on patient status, additional functional criteria and insurance authorization.  Follow Up Recommendations  Can patient physically be transported by private vehicle: Yes    Assistance Recommended at Discharge Frequent or constant Supervision/Assistance  Patient can return home with the following Direct supervision/assist for medications management;Assistance with cooking/housework;Assist for transportation;A little help with walking and/or transfers;A little help with bathing/dressing/bathroom   Equipment Recommendations  Rolling walker (2 wheels)    Recommendations for Other Services       Precautions / Restrictions Precautions Precautions: Fall Restrictions Weight Bearing Restrictions: No     Mobility  Bed Mobility Overal bed mobility: Needs Assistance Bed Mobility: Supine  to Sit, Sit to Supine Rolling: Min guard Sidelying to sit: Min assist Supine to sit: Min assist Sit to supine: Min guard   General bed mobility comments: increased time and effort required. cues  and assist for task initiation    Transfers Overall transfer level: Needs assistance Equipment used: 1 person hand held assist Transfers: Sit to/from Stand Sit to Stand: Min guard           General transfer comment: Min guard for safety. Needs max encouragment to participate.    Ambulation/Gait Ambulation/Gait assistance: Min assist Gait Distance (Feet): 125 Feet (125 x 2) Assistive device: 1 person hand held assist, None Gait Pattern/deviations: Step-through pattern, Narrow base of support Gait velocity: decreased Gait velocity interpretation: <1.31 ft/sec, indicative of household ambulator   General Gait Details: Difficult to motivate pt but pt finally agreed to walk to desk to get soda.  Pt needed assistance required for steadying as patient has narrow base of support with mild unsteadiness. navigational cues also required with mobility. mild dyspnea at the end of the walking bout.  Pt took one seated rest break close to desk and then walked back to room.   Stairs             Wheelchair Mobility    Modified Rankin (Stroke Patients Only)       Balance Overall balance assessment: Needs assistance Sitting-balance support: Feet supported Sitting balance-Leahy Scale: Fair Sitting balance - Comments: Intermittent UE support   Standing balance support: Single extremity supported Standing balance-Leahy Scale: Poor Standing balance comment: external support required, unsteadiness with dyanmic activity                            Cognition Arousal/Alertness: Awake/alert Behavior During  Therapy: Flat affect Overall Cognitive Status: No family/caregiver present to determine baseline cognitive functioning Area of Impairment: Attention, Following commands,  Problem solving, Orientation, Memory, Safety/judgement, Awareness                 Orientation Level: Disoriented to, Place, Time, Situation Current Attention Level: Sustained Memory: Decreased short-term memory, Decreased recall of precautions Following Commands: Follows one step commands consistently Safety/Judgement: Decreased awareness of safety, Decreased awareness of deficits Awareness: Intellectual Problem Solving: Slow processing, Requires verbal cues, Decreased initiation General Comments: Following commands more consistently and not requiring multimodal cues        Exercises Other Exercises Other Exercises: supine BLE AROM: ankle pumps x10    General Comments General comments (skin integrity, edema, etc.): VSS      Pertinent Vitals/Pain Pain Assessment Pain Assessment: Faces Faces Pain Scale: Hurts even more Breathing: normal Negative Vocalization: none Facial Expression: smiling or inexpressive Body Language: relaxed Consolability: no need to console PAINAD Score: 0 Pain Location: buttocks Pain Descriptors / Indicators: Restless, Guarding Pain Intervention(s): Limited activity within patient's tolerance, Monitored during session, Repositioned    Home Living                          Prior Function            PT Goals (current goals can now be found in the care plan section) Acute Rehab PT Goals Patient Stated Goal: not stated Progress towards PT goals: Progressing toward goals    Frequency    Min 2X/week      PT Plan Current plan remains appropriate    Co-evaluation              AM-PAC PT "6 Clicks" Mobility   Outcome Measure  Help needed turning from your back to your side while in a flat bed without using bedrails?: A Little Help needed moving from lying on your back to sitting on the side of a flat bed without using bedrails?: A Little Help needed moving to and from a bed to a chair (including a wheelchair)?: A  Little Help needed standing up from a chair using your arms (e.g., wheelchair or bedside chair)?: A Little Help needed to walk in hospital room?: A Little Help needed climbing 3-5 steps with a railing? : A Little 6 Click Score: 18    End of Session Equipment Utilized During Treatment: Gait belt Activity Tolerance: Patient tolerated treatment well Patient left: in bed;with call bell/phone within reach;with bed alarm set;with nursing/sitter in room (1:1 sitter in the room) Nurse Communication: Mobility status PT Visit Diagnosis: Other abnormalities of gait and mobility (R26.89);Muscle weakness (generalized) (M62.81);Difficulty in walking, not elsewhere classified (R26.2)     Time: 4098-1191 PT Time Calculation (min) (ACUTE ONLY): 23 min  Charges:  $Gait Training: 8-22 mins $Therapeutic Exercise: 8-22 mins                     [facility] M,PT Acute Rehab Services [phone]    Bevelyn Buckles 08/15/2022, 1:53 PM

## 2022-08-15 NOTE — Progress Notes (Addendum)
Physical Therapy Treatment Patient Details Name: Carla [name] MRN: [id number] DOB: [birth year] Today's Date: 08/15/2022   History of Present Illness 77 yo female admitted 3/20 unresponsive from home with acute respiratory failure with hypoxia and hypercapnia and metabolic encephalopathy. Intubated 3/20. trach 4/8. Self decannulated. PMhx: emphysema, tobacco abuse, PVD, HTN, prediabetes, anxiety, dementia    PT Comments    Pt admitted with above diagnosis. Pt was able to ambulate with HHA of 1 with some LOB due to narrow BOS and pt decr safety awareness. Pt needs min assist and cues.  Today focused on getting a soda and would not agree to balance activities. Pt can get agitated quickly and needs redirection at times. Will follow acutely.  Pt currently with functional limitations due to balance and endurance deficits. Pt will benefit from acute skilled PT to increase their independence and safety with mobility to allow discharge.      Recommendations for follow up therapy are one component of a multi-disciplinary discharge planning process, led by the attending physician.  Recommendations may be updated based on patient status, additional functional criteria and insurance authorization.  Follow Up Recommendations  Can patient physically be transported by private vehicle: Yes    Assistance Recommended at Discharge Frequent or constant Supervision/Assistance  Patient can return home with the following Direct supervision/assist for medications management;Assistance with cooking/housework;Assist for transportation;A little help with walking and/or transfers;A little help with bathing/dressing/bathroom   Equipment Recommendations  Rolling walker (2 wheels)    Recommendations for Other Services       Precautions / Restrictions Precautions Precautions: Fall Restrictions Weight Bearing Restrictions: No     Mobility  Bed Mobility Overal bed mobility: Needs Assistance Bed Mobility: Supine  to Sit, Sit to Supine Rolling: Min guard Sidelying to sit: Min assist Supine to sit: Min assist Sit to supine: Min guard   General bed mobility comments: increased time and effort required. cues  and assist for task initiation    Transfers Overall transfer level: Needs assistance Equipment used: 1 person hand held assist Transfers: Sit to/from Stand Sit to Stand: Min guard           General transfer comment: Min guard for safety. Needs max encouragment to participate.    Ambulation/Gait Ambulation/Gait assistance: Min assist Gait Distance (Feet): 125 Feet (125 x 2) Assistive device: 1 person hand held assist, None Gait Pattern/deviations: Step-through pattern, Narrow base of support Gait velocity: decreased Gait velocity interpretation: <1.31 ft/sec, indicative of household ambulator   General Gait Details: Difficult to motivate pt but pt finally agreed to walk to desk to get soda.  Pt needed assistance required for steadying as patient has narrow base of support with mild unsteadiness. navigational cues also required with mobility. mild dyspnea at the end of the walking bout.  Pt took one seated rest break close to desk and then walked back to room.   Stairs             Wheelchair Mobility    Modified Rankin (Stroke Patients Only)       Balance Overall balance assessment: Needs assistance Sitting-balance support: Feet supported Sitting balance-Leahy Scale: Fair Sitting balance - Comments: Intermittent UE support   Standing balance support: Single extremity supported Standing balance-Leahy Scale: Poor Standing balance comment: external support required, unsteadiness with dyanmic activity                            Cognition Arousal/Alertness: Awake/alert Behavior During  Therapy: Flat affect Overall Cognitive Status: No family/caregiver present to determine baseline cognitive functioning Area of Impairment: Attention, Following commands,  Problem solving, Orientation, Memory, Safety/judgement, Awareness                 Orientation Level: Disoriented to, Place, Time, Situation Current Attention Level: Sustained Memory: Decreased short-term memory, Decreased recall of precautions Following Commands: Follows one step commands consistently Safety/Judgement: Decreased awareness of safety, Decreased awareness of deficits Awareness: Intellectual Problem Solving: Slow processing, Requires verbal cues, Decreased initiation General Comments: Following commands more consistently and not requiring multimodal cues        Exercises Other Exercises Other Exercises: supine BLE AROM: ankle pumps x10    General Comments General comments (skin integrity, edema, etc.): VSS      Pertinent Vitals/Pain Pain Assessment Pain Assessment: Faces Faces Pain Scale: Hurts even more Breathing: normal Negative Vocalization: none Facial Expression: smiling or inexpressive Body Language: relaxed Consolability: no need to console PAINAD Score: 0 Pain Location: buttocks Pain Descriptors / Indicators: Restless, Guarding Pain Intervention(s): Limited activity within patient's tolerance, Monitored during session, Repositioned    Home Living                          Prior Function            PT Goals (current goals can now be found in the care plan section) Acute Rehab PT Goals Patient Stated Goal: not stated Progress towards PT goals: Progressing toward goals    Frequency    Min 2X/week      PT Plan Current plan remains appropriate    Co-evaluation              AM-PAC PT "6 Clicks" Mobility   Outcome Measure  Help needed turning from your back to your side while in a flat bed without using bedrails?: A Little Help needed moving from lying on your back to sitting on the side of a flat bed without using bedrails?: A Little Help needed moving to and from a bed to a chair (including a wheelchair)?: A  Little Help needed standing up from a chair using your arms (e.g., wheelchair or bedside chair)?: A Little Help needed to walk in hospital room?: A Little Help needed climbing 3-5 steps with a railing? : A Little 6 Click Score: 18    End of Session Equipment Utilized During Treatment: Gait belt Activity Tolerance: Patient tolerated treatment well Patient left: in bed;with call bell/phone within reach;with bed alarm set;with nursing/sitter in room (1:1 sitter in the room) Nurse Communication: Mobility status PT Visit Diagnosis: Other abnormalities of gait and mobility (R26.89);Muscle weakness (generalized) (M62.81);Difficulty in walking, not elsewhere classified (R26.2)     Time: 4098-1191 PT Time Calculation (min) (ACUTE ONLY): 23 min  Charges:  $Gait Training: 8-22 mins $Therapeutic Exercise: 8-22 mins                     [facility] M,PT Acute Rehab Services [phone]    Bevelyn Buckles 08/15/2022, 1:53 PM

## 2022-08-16 DIAGNOSIS — R918 Other nonspecific abnormal finding of lung field: Secondary | ICD-10-CM | POA: Diagnosis not present

## 2022-08-16 DIAGNOSIS — F419 Anxiety disorder, unspecified: Secondary | ICD-10-CM | POA: Diagnosis not present

## 2022-08-16 DIAGNOSIS — J441 Chronic obstructive pulmonary disease with (acute) exacerbation: Secondary | ICD-10-CM | POA: Diagnosis not present

## 2022-08-16 DIAGNOSIS — G9340 Encephalopathy, unspecified: Secondary | ICD-10-CM | POA: Diagnosis not present

## 2022-08-16 DIAGNOSIS — J9602 Acute respiratory failure with hypercapnia: Secondary | ICD-10-CM | POA: Diagnosis not present

## 2022-08-16 DIAGNOSIS — N179 Acute kidney failure, unspecified: Secondary | ICD-10-CM | POA: Diagnosis not present

## 2022-08-16 DIAGNOSIS — I10 Essential (primary) hypertension: Secondary | ICD-10-CM | POA: Diagnosis not present

## 2022-08-16 DIAGNOSIS — J9601 Acute respiratory failure with hypoxia: Secondary | ICD-10-CM | POA: Diagnosis not present

## 2022-08-16 DIAGNOSIS — F03918 Unspecified dementia, unspecified severity, with other behavioral disturbance: Secondary | ICD-10-CM | POA: Diagnosis not present

## 2022-08-16 DIAGNOSIS — I739 Peripheral vascular disease, unspecified: Secondary | ICD-10-CM | POA: Diagnosis not present

## 2022-08-16 DIAGNOSIS — E43 Unspecified severe protein-calorie malnutrition: Secondary | ICD-10-CM | POA: Diagnosis not present

## 2022-08-16 DIAGNOSIS — Z720 Tobacco use: Secondary | ICD-10-CM | POA: Diagnosis not present

## 2022-08-16 NOTE — Progress Notes (Signed)
      Chart reviewed and spoke with Dr. Alanda Slim.  Initial PMT consult was 07/12/22. Goals have been clear for DNR with limited interventions (treat the treatable).   PMT will sign off. Please re-consult if needed.    Sherlean Foot, NP-C Palliative Medicine   Please call Palliative Medicine team phone with any questions [phone]. For individual providers please see AMION.   No charge

## 2022-08-16 NOTE — Progress Notes (Addendum)
PROGRESS NOTE  Carla [name] ZOX:[id number] DOB: 1945/07/04   PCP: Patient, [name]  Patient is from:   DOA: 06/12/2022 LOS: 65  Chief complaints Chief Complaint  Patient presents with   Altered Mental Status     Brief Narrative / Interim history: 86 yof w/ COPD/emphysema tobacco use, Hypertension, Dementia, Anxiety, Depression, GERD, Primary hyperparathyroidism, Vit D deficiency, HLD, Osteoporosis, found by family unresponsive and brought to ER on 06/12/22 and found to have acute on chronic hypercapnia on ABG>Intubated in ER and treated for COPD exacerbation. She had prolonged complicated hospitalization charted as below with multiple ICU admissions, needing tracheostomy placement, having encephalopathy with baseline dementia and pulling out her PICC line tracheostomy.   Significant Events  3/20 Admitted after being found unresponsive, intubated on arrival to ED CT angio chest >> atherosclerosis, severe coronary calcification, severe LVH, mild centrilobular emphysema, 2 mm nodule LUL, 4 mm nodule LUL Blood culture  >> Haemophilus influenzae 3/22 Echo 06/14/22 >> EF 60 to 65%, mod LVH, grade 1 DD, mild/mod AR 3/25 bronch for peak pressures, low tidal volumes with mucus cast at end of ETT, thick secretions caking tube 3/29 started on Zosyn for broad coverage due to purulent secretions LLL with plugging and LLL atelectasis, increased O2 requirement on vent 4/2 family meeting, made DNR, plans for one way extubation 4/5 4/6-family changes mind regarding one-way extubation, plan will be for tracheostomy 4/8 trach placed 4/19 off vent x 72 hours so removed from room.  Planning on changing trach to cuffless.  Continuing to work with PMV.  Hypoglycemia adding D5 to maintenance fluids 4/26: Patient pulled tracheostomy tube but remained stable.  4/28: Patient pulled PICC line, PIV inserted, Soft restraints.   Subjective: Seen and examined earlier this morning.  No major events overnight of  this morning.  No complaints.  Asking for more food.  She already ate breakfast.  Safety sitter at bedside.  Objective: Vitals:   08/15/22 1918 08/16/22 0626 08/16/22 0703 08/16/22 0731  BP: 117/77 116/62  131/71  Pulse: 93 88  85  Resp: 16 16  20   Temp: 98.7 F (37.1 C) 99 F (37.2 C)  97.8 F (36.6 C)  TempSrc: Oral Oral  Oral  SpO2: 95% 96%  93%  Weight:   37.3 kg   Height:        Examination:  GENERAL: No apparent distress.  Nontoxic. HEENT: MMM.  Vision and hearing grossly intact.  NECK: Supple.  No apparent JVD.  RESP:  No IWOB.  Fair aeration bilaterally. CVS:  RRR. Heart sounds normal.  ABD/GI/GU: BS+. Abd soft, NTND.  MSK/EXT:  Moves extremities. No apparent deformity. No edema.  SKIN: no apparent skin lesion or wound NEURO: Awake, alert and oriented to self.  Follows commands.  No apparent focal neuro deficit. PSYCH: Calm. Normal affect.    Microbiology summarized: COVID-19, influenza and RSV PCR nonreactive MRSA PCR screen nonreactive Urine culture with pansensitive E. Coli Blood culture with haemophilus influenza in 1 out of 2 bottles Repeat blood cultures negative Respiratory cultures with normal respiratory flora  Assessment and plan: Principal Problem:   Acute respiratory failure with hypoxia and hypercapnia (HCC) Active Problems:   COPD with exacerbation (HCC)   Hyperglycemia   Acute metabolic encephalopathy   HTN (hypertension)   Tobacco abuse   Anxiety   Peripheral vascular disease (HCC)   Pulmonary nodules   Dementia (HCC)   Protein-calorie malnutrition, severe (HCC)   Urinary retention   Hypervolemia   Encephalopathy  acute   Tracheostomy in place [facility])   AKI (acute kidney injury) (HCC)   Hypernatremia   Fever   Acute respiratory failure with hypercapnia (HCC)   Hyponatremia   Pressure injury of skin   Acute hypoxic/hypercapnic respiratory failure: Intubation and mechanical ventilation from 3/20-4/5. Tracheostomy on 4/6. Pulled out  trach on 4/26.  Trach site healed.  Currently on room air. -Cont Yupelri, Brovana Pulmicort  Nebs, and bronchodilator PRN.   Acute metabolic encephalopathy in the background of dementia: based on previous reports it looks like at baseline patient has regular conversations with herself and wanders out of the house.   Presented with unresponsiveness s/p extensive workup including  head CT negative for acute abnormality. Felt to be due to respiratory failure and also with ICU delirium.  Remains confused/disoriented.  Patient was seen by psychiatry and started on Seroquel.  Currently off benzos. -Dose of Seroquel increased to 75 mg qhs 5/15 -Reorientation and delirium precaution   Dysphagia-advanced to dysphagia 3 diet. -Continue dysphagia 3 diet   Multiple falls in the hospital :found on the floor on 5/2  and 5/8.  No serious injury.  Negative imaging. -Fall precaution  Pulmonary nodules: Incidental finding on CT 06/12/2022 - 2 mm and 4 mm noncalcified LUL lung nodules.   -May need outpatient surveillance depending on her prognosis and goals of care going forward.  Acute kidney injury: Resolved   Essential hypertension: Normotensive. -continue Norvasc 5 mg daily with Coreg 3.125 mg twice a day   Chronic diastolic CHF: Currently euvolemic   Hypokalemia: Resolved   Physical deconditioning: PT and OT has not seen her.  SNF is recommended.  Difficult to place.  Social worker is following.  Family looking into assisted living facilities/memory care unit   Severe Malnutrition Body mass index is 16.06 kg/m. Nutrition Problem: Severe Malnutrition Etiology: chronic illness (COPD, dementia) Signs/Symptoms: severe fat depletion, severe muscle depletion Interventions: Ensure Enlive (each supplement provides 350kcal and 20 grams of protein), Juven   DVT prophylaxis:  enoxaparin (LOVENOX) injection 30 mg Start: 07/02/22 1000  Code Status: DNR/DNI Family Communication: None at bedside Level of  care: Med-Surg Status is: Inpatient Remains inpatient appropriate because: Placement   Final disposition: LOS/memory care? Consultants:  Pulmonology Psychiatry  25 minutes with more than 50% spent in reviewing records, counseling patient/family and coordinating care.   Sch Meds:  Scheduled Meds:  amLODipine  5 mg Oral Daily   arformoterol  15 mcg Nebulization BID   budesonide (PULMICORT) nebulizer solution  0.5 mg Nebulization BID   carvedilol  3.125 mg Oral BID WC   enoxaparin (LOVENOX) injection  30 mg Subcutaneous Daily   feeding supplement  1 Container Oral BID BM   leptospermum manuka honey  1 Application Topical Daily   mupirocin cream   Topical BID   nicotine  21 mg Transdermal Daily   nutrition supplement (JUVEN)  1 packet Oral BID BM   mouth rinse  15 mL Mouth Rinse 4 times per day   pantoprazole  40 mg Oral Daily   QUEtiapine  25 mg Oral BID   QUEtiapine  75 mg Oral QHS   revefenacin  175 mcg Nebulization Daily   Continuous Infusions: PRN Meds:.acetaminophen **OR** acetaminophen, albuterol, haloperidol lactate, lip balm, ondansetron **OR** ondansetron (ZOFRAN) IV, mouth rinse, polyethylene glycol, senna  Antimicrobials: Anti-infectives (From admission, onward)    Start     Dose/Rate Route Frequency Ordered Stop   07/31/22 1030  doxycycline (VIBRA-TABS) tablet 100 mg  100 mg Oral Every 12 hours 07/31/22 0932 08/05/22 0959   07/06/22 1000  cefTRIAXone (ROCEPHIN) 2 g in sodium chloride 0.9 % 100 mL IVPB        2 g 200 mL/hr over 30 Minutes Intravenous Every 24 hours 07/06/22 0848 07/08/22 0952   07/05/22 0200  piperacillin-tazobactam (ZOSYN) IVPB 3.375 g  Status:  Discontinued        3.375 g 12.5 mL/hr over 240 Minutes Intravenous Every 8 hours 07/04/22 1826 07/06/22 0848   07/04/22 1915  vancomycin (VANCOCIN) IVPB 1000 mg/200 mL premix  Status:  Discontinued        1,000 mg 200 mL/hr over 60 Minutes Intravenous Every 48 hours 07/04/22 1826 07/05/22 0827    07/04/22 1915  piperacillin-tazobactam (ZOSYN) IVPB 3.375 g        3.375 g 100 mL/hr over 30 Minutes Intravenous  Once 07/04/22 1826 07/04/22 1914   06/21/22 2300  piperacillin-tazobactam (ZOSYN) IVPB 3.375 g        3.375 g 12.5 mL/hr over 240 Minutes Intravenous Every 8 hours 06/21/22 1631 06/29/22 2009   06/21/22 1645  piperacillin-tazobactam (ZOSYN) IVPB 3.375 g  Status:  Discontinued        3.375 g 12.5 mL/hr over 240 Minutes Intravenous Every 8 hours 06/21/22 1630 06/21/22 1631   06/21/22 1645  piperacillin-tazobactam (ZOSYN) IVPB 3.375 g        3.375 g 100 mL/hr over 30 Minutes Intravenous STAT 06/21/22 1631 06/21/22 1900   06/14/22 1315  cefTRIAXone (ROCEPHIN) 2 g in sodium chloride 0.9 % 100 mL IVPB  Status:  Discontinued        2 g 200 mL/hr over 30 Minutes Intravenous Every 24 hours 06/14/22 1249 06/20/22 1415   06/12/22 2230  doxycycline (VIBRAMYCIN) 100 mg in sodium chloride 0.9 % 250 mL IVPB  Status:  Discontinued        100 mg 125 mL/hr over 120 Minutes Intravenous Every 12 hours 06/12/22 2139 06/14/22 1350        I have personally reviewed the following labs and images: CBC: No results for input(s): "WBC", "NEUTROABS", "HGB", "HCT", "MCV", "PLT" in the last 168 hours. BMP &GFR No results for input(s): "NA", "K", "CL", "CO2", "GLUCOSE", "BUN", "CREATININE", "CALCIUM", "MG", "PHOS" in the last 168 hours.  Invalid input(s): "GFRCG" Estimated Creatinine Clearance: 31.9 mL/min (by C-G formula based on SCr of 0.87 mg/dL). Liver & Pancreas: No results for input(s): "AST", "ALT", "ALKPHOS", "BILITOT", "PROT", "ALBUMIN" in the last 168 hours. No results for input(s): "LIPASE", "AMYLASE" in the last 168 hours. No results for input(s): "AMMONIA" in the last 168 hours. Diabetic: No results for input(s): "HGBA1C" in the last 72 hours. No results for input(s): "GLUCAP" in the last 168 hours. Cardiac Enzymes: No results for input(s): "CKTOTAL", "CKMB", "CKMBINDEX",  "TROPONINI" in the last 168 hours. No results for input(s): "PROBNP" in the last 8760 hours. Coagulation Profile: No results for input(s): "INR", "PROTIME" in the last 168 hours. Thyroid Function Tests: No results for input(s): "TSH", "T4TOTAL", "FREET4", "T3FREE", "THYROIDAB" in the last 72 hours. Lipid Profile: No results for input(s): "CHOL", "HDL", "LDLCALC", "TRIG", "CHOLHDL", "LDLDIRECT" in the last 72 hours. Anemia Panel: No results for input(s): "VITAMINB12", "FOLATE", "FERRITIN", "TIBC", "IRON", "RETICCTPCT" in the last 72 hours. Urine analysis:    Component Value Date/Time   COLORURINE YELLOW 07/04/2022 1612   APPEARANCEUR CLOUDY (A) 07/04/2022 1612   LABSPEC 1.014 07/04/2022 1612   PHURINE 8.0 07/04/2022 1612   GLUCOSEU NEGATIVE 07/04/2022 1612  HGBUR NEGATIVE 07/04/2022 1612   BILIRUBINUR NEGATIVE 07/04/2022 1612   KETONESUR NEGATIVE 07/04/2022 1612   PROTEINUR 30 (A) 07/04/2022 1612   NITRITE NEGATIVE 07/04/2022 1612   LEUKOCYTESUR LARGE (A) 07/04/2022 1612   Sepsis Labs: Invalid input(s): "PROCALCITONIN", "LACTICIDVEN"  Microbiology: No results found for this or any previous visit (from the past 240 hour(s)).  Radiology Studies: No results found.    Raedyn Wenke T. Karin Pinedo [facility]  If 7PM-7AM, please contact night-coverage [email or web address] 08/16/2022, 11:41 AM

## 2022-08-17 NOTE — Progress Notes (Addendum)
PROGRESS NOTE  Carla [name] XLK:[id number] DOB: 1945-07-25   PCP: Patient, [name]  Patient is from:   DOA: 06/12/2022 LOS: 66  Chief complaints Chief Complaint  Patient presents with   Altered Mental Status     Brief Narrative / Interim history: 28 yof w/ COPD/emphysema tobacco use, Hypertension, Dementia, Anxiety, Depression, GERD, Primary hyperparathyroidism, Vit D deficiency, HLD, Osteoporosis, found by family unresponsive and brought to ER on 06/12/22 and found to have acute on chronic hypercapnia on ABG>Intubated in ER and treated for COPD exacerbation. She had prolonged complicated hospitalization charted as below with multiple ICU admissions, needing tracheostomy placement, having encephalopathy with baseline dementia and pulling out her PICC line tracheostomy.   Significant Events  3/20 Admitted after being found unresponsive, intubated on arrival to ED CT angio chest >> atherosclerosis, severe coronary calcification, severe LVH, mild centrilobular emphysema, 2 mm nodule LUL, 4 mm nodule LUL Blood culture  >> Haemophilus influenzae 3/22 Echo 06/14/22 >> EF 60 to 65%, mod LVH, grade 1 DD, mild/mod AR 3/25 bronch for peak pressures, low tidal volumes with mucus cast at end of ETT, thick secretions caking tube 3/29 started on Zosyn for broad coverage due to purulent secretions LLL with plugging and LLL atelectasis, increased O2 requirement on vent 4/2 family meeting, made DNR, plans for one way extubation 4/5 4/6-family changes mind regarding one-way extubation, plan will be for tracheostomy 4/8 trach placed 4/19 off vent x 72 hours so removed from room.  Planning on changing trach to cuffless.  Continuing to work with PMV.  Hypoglycemia adding D5 to maintenance fluids 4/26: Patient pulled tracheostomy tube but remained stable.  4/28: Patient pulled PICC line, PIV inserted, Soft restraints.   Subjective: Seen and examined earlier this morning.  Sleeping quietly.  Sitter at  bedside.  Per bedside sitter, no issues.  She ate her breakfast and went back to bed.  Objective: Vitals:   08/16/22 2008 08/17/22 0541 08/17/22 0749 08/17/22 0915  BP: 137/71 (!) 146/67 136/70   Pulse: 82 84 74   Resp: 16 16 20    Temp: 98.7 F (37.1 C) 98.6 F (37 C) 98.2 F (36.8 C)   TempSrc: Oral Oral    SpO2: 93% 97% 94% 96%  Weight:  37.1 kg    Height:        Examination:  GENERAL: No apparent distress.  Nontoxic. HEENT: MMM.  Vision and hearing grossly intact.  NECK: Supple.  No apparent JVD.  RESP:  No IWOB.  On room air. SKIN: no apparent skin lesion or wound NEURO: Sleeping. PSYCH: Calm.    Microbiology summarized: COVID-19, influenza and RSV PCR nonreactive MRSA PCR screen nonreactive Urine culture with pansensitive E. Coli Blood culture with haemophilus influenza in 1 out of 2 bottles Repeat blood cultures negative Respiratory cultures with normal respiratory flora  Assessment and plan: Principal Problem:   Acute respiratory failure with hypoxia and hypercapnia (HCC) Active Problems:   COPD with exacerbation (HCC)   Hyperglycemia   Acute metabolic encephalopathy   HTN (hypertension)   Tobacco abuse   Anxiety   Peripheral vascular disease (HCC)   Pulmonary nodules   Dementia (HCC)   Protein-calorie malnutrition, severe (HCC)   Urinary retention   Hypervolemia   Encephalopathy acute   Tracheostomy in place (HCC)   AKI (acute kidney injury) (HCC)   Hypernatremia   Fever   Acute respiratory failure with hypercapnia (HCC)   Hyponatremia   Pressure injury of skin   Acute hypoxic/hypercapnic  respiratory failure: Intubation and mechanical ventilation from 3/20-4/5. Tracheostomy on 4/6. Pulled out trach on 4/26.  Trach site healed.  Currently on room air. -Cont Yupelri, Brovana Pulmicort  Nebs, and bronchodilator PRN.   Acute metabolic encephalopathy in the background of dementia: based on previous reports it looks like at baseline patient has  regular conversations with herself and wanders out of the house.   Presented with unresponsiveness s/p extensive workup including  head CT negative for acute abnormality. Felt to be due to respiratory failure and also with ICU delirium.  Remains confused/disoriented.  Patient was seen by psychiatry and started on Seroquel.  Currently off benzos. -Dose of Seroquel increased to 75 mg qhs 5/15 -Reorientation and delirium precaution   Dysphagia-advanced to dysphagia 3 diet. -Continue dysphagia 3 diet   Multiple falls in the hospital :found on the floor on 5/2  and 5/8.  No serious injury.  Negative imaging. -Fall precaution  Pulmonary nodules: Incidental finding on CT 06/12/2022 - 2 mm and 4 mm noncalcified LUL lung nodules.   -May need outpatient surveillance depending on her prognosis and goals of care going forward.  Acute kidney injury: Resolved   Essential hypertension: Normotensive. -continue Norvasc 5 mg daily with Coreg 3.125 mg twice a day   Chronic diastolic CHF: Currently euvolemic   Hypokalemia: Resolved   Physical deconditioning: PT and OT has not seen her.  SNF is recommended.  Difficult to place.  Social worker is following.  Family looking into assisted living facilities/memory care unit   Severe Malnutrition Body mass index is 15.97 kg/m. Nutrition Problem: Severe Malnutrition Etiology: chronic illness (COPD, dementia) Signs/Symptoms: severe fat depletion, severe muscle depletion Interventions: Ensure Enlive (each supplement provides 350kcal and 20 grams of protein), Juven   DVT prophylaxis:  enoxaparin (LOVENOX) injection 30 mg Start: 07/02/22 1000  Code Status: DNR/DNI Family Communication: None at bedside Level of care: Med-Surg Status is: Inpatient Remains inpatient appropriate because: Placement   Final disposition: LOS/memory care? Consultants:  Pulmonology Psychiatry  25 minutes with more than 50% spent in reviewing records, counseling patient/family  and coordinating care.   Sch Meds:  Scheduled Meds:  amLODipine  5 mg Oral Daily   arformoterol  15 mcg Nebulization BID   budesonide (PULMICORT) nebulizer solution  0.5 mg Nebulization BID   carvedilol  3.125 mg Oral BID WC   enoxaparin (LOVENOX) injection  30 mg Subcutaneous Daily   feeding supplement  1 Container Oral BID BM   leptospermum manuka honey  1 Application Topical Daily   mupirocin cream   Topical BID   nicotine  21 mg Transdermal Daily   nutrition supplement (JUVEN)  1 packet Oral BID BM   mouth rinse  15 mL Mouth Rinse 4 times per day   pantoprazole  40 mg Oral Daily   QUEtiapine  25 mg Oral BID   QUEtiapine  75 mg Oral QHS   revefenacin  175 mcg Nebulization Daily   Continuous Infusions: PRN Meds:.acetaminophen **OR** acetaminophen, albuterol, haloperidol lactate, lip balm, ondansetron **OR** ondansetron (ZOFRAN) IV, mouth rinse, polyethylene glycol, senna  Antimicrobials: Anti-infectives (From admission, onward)    Start     Dose/Rate Route Frequency Ordered Stop   07/31/22 1030  doxycycline (VIBRA-TABS) tablet 100 mg        100 mg Oral Every 12 hours 07/31/22 0932 08/05/22 0959   07/06/22 1000  cefTRIAXone (ROCEPHIN) 2 g in sodium chloride 0.9 % 100 mL IVPB        2 g 200  mL/hr over 30 Minutes Intravenous Every 24 hours 07/06/22 0848 07/08/22 0952   07/05/22 0200  piperacillin-tazobactam (ZOSYN) IVPB 3.375 g  Status:  Discontinued        3.375 g 12.5 mL/hr over 240 Minutes Intravenous Every 8 hours 07/04/22 1826 07/06/22 0848   07/04/22 1915  vancomycin (VANCOCIN) IVPB 1000 mg/200 mL premix  Status:  Discontinued        1,000 mg 200 mL/hr over 60 Minutes Intravenous Every 48 hours 07/04/22 1826 07/05/22 0827   07/04/22 1915  piperacillin-tazobactam (ZOSYN) IVPB 3.375 g        3.375 g 100 mL/hr over 30 Minutes Intravenous  Once 07/04/22 1826 07/04/22 1914   06/21/22 2300  piperacillin-tazobactam (ZOSYN) IVPB 3.375 g        3.375 g 12.5 mL/hr over 240  Minutes Intravenous Every 8 hours 06/21/22 1631 06/29/22 2009   06/21/22 1645  piperacillin-tazobactam (ZOSYN) IVPB 3.375 g  Status:  Discontinued        3.375 g 12.5 mL/hr over 240 Minutes Intravenous Every 8 hours 06/21/22 1630 06/21/22 1631   06/21/22 1645  piperacillin-tazobactam (ZOSYN) IVPB 3.375 g        3.375 g 100 mL/hr over 30 Minutes Intravenous STAT 06/21/22 1631 06/21/22 1900   06/14/22 1315  cefTRIAXone (ROCEPHIN) 2 g in sodium chloride 0.9 % 100 mL IVPB  Status:  Discontinued        2 g 200 mL/hr over 30 Minutes Intravenous Every 24 hours 06/14/22 1249 06/20/22 1415   06/12/22 2230  doxycycline (VIBRAMYCIN) 100 mg in sodium chloride 0.9 % 250 mL IVPB  Status:  Discontinued        100 mg 125 mL/hr over 120 Minutes Intravenous Every 12 hours 06/12/22 2139 06/14/22 1350        I have personally reviewed the following labs and images: CBC: No results for input(s): "WBC", "NEUTROABS", "HGB", "HCT", "MCV", "PLT" in the last 168 hours. BMP &GFR No results for input(s): "NA", "K", "CL", "CO2", "GLUCOSE", "BUN", "CREATININE", "CALCIUM", "MG", "PHOS" in the last 168 hours.  Invalid input(s): "GFRCG" Estimated Creatinine Clearance: 31.7 mL/min (by C-G formula based on SCr of 0.87 mg/dL). Liver & Pancreas: No results for input(s): "AST", "ALT", "ALKPHOS", "BILITOT", "PROT", "ALBUMIN" in the last 168 hours. No results for input(s): "LIPASE", "AMYLASE" in the last 168 hours. No results for input(s): "AMMONIA" in the last 168 hours. Diabetic: No results for input(s): "HGBA1C" in the last 72 hours. No results for input(s): "GLUCAP" in the last 168 hours. Cardiac Enzymes: No results for input(s): "CKTOTAL", "CKMB", "CKMBINDEX", "TROPONINI" in the last 168 hours. No results for input(s): "PROBNP" in the last 8760 hours. Coagulation Profile: No results for input(s): "INR", "PROTIME" in the last 168 hours. Thyroid Function Tests: No results for input(s): "TSH", "T4TOTAL", "FREET4",  "T3FREE", "THYROIDAB" in the last 72 hours. Lipid Profile: No results for input(s): "CHOL", "HDL", "LDLCALC", "TRIG", "CHOLHDL", "LDLDIRECT" in the last 72 hours. Anemia Panel: No results for input(s): "VITAMINB12", "FOLATE", "FERRITIN", "TIBC", "IRON", "RETICCTPCT" in the last 72 hours. Urine analysis:    Component Value Date/Time   COLORURINE YELLOW 07/04/2022 1612   APPEARANCEUR CLOUDY (A) 07/04/2022 1612   LABSPEC 1.014 07/04/2022 1612   PHURINE 8.0 07/04/2022 1612   GLUCOSEU NEGATIVE 07/04/2022 1612   HGBUR NEGATIVE 07/04/2022 1612   BILIRUBINUR NEGATIVE 07/04/2022 1612   KETONESUR NEGATIVE 07/04/2022 1612   PROTEINUR 30 (A) 07/04/2022 1612   NITRITE NEGATIVE 07/04/2022 1612   LEUKOCYTESUR LARGE (A) 07/04/2022 1612  Sepsis Labs: Invalid input(s): "PROCALCITONIN", "LACTICIDVEN"  Microbiology: No results found for this or any previous visit (from the past 240 hour(s)).  Radiology Studies: No results found.    Sherard Sutch T. Ishitha Roper [facility]  If 7PM-7AM, please contact night-coverage [email or web address] 08/17/2022, 12:24 PM

## 2022-08-18 MED ORDER — MOMETASONE FURO-FORMOTEROL FUM 200-5 MCG/ACT IN AERO
2.0000 | INHALATION_SPRAY | Freq: Two times a day (BID) | RESPIRATORY_TRACT | Status: DC
  Administered 2022-08-18 – 2022-08-29 (×11): 2 via RESPIRATORY_TRACT
  Filled 2022-08-18 (×2): qty 8.8

## 2022-08-18 MED ORDER — UMECLIDINIUM BROMIDE 62.5 MCG/ACT IN AEPB
1.0000 | INHALATION_SPRAY | Freq: Every day | RESPIRATORY_TRACT | Status: DC
  Administered 2022-08-19 – 2022-08-20 (×2): 1 via RESPIRATORY_TRACT
  Filled 2022-08-18 (×3): qty 7

## 2022-08-18 NOTE — Progress Notes (Addendum)
PROGRESS NOTE  Carla [name]:[id number] DOB: 28-Mar-1945   PCP: Patient, [name]  Patient is from:   DOA: 06/12/2022 LOS: 67  Chief complaints Chief Complaint  Patient presents with   Altered Mental Status     Brief Narrative / Interim history: 76 yof w/ COPD/emphysema tobacco use, Hypertension, Dementia, Anxiety, Depression, GERD, Primary hyperparathyroidism, Vit D deficiency, HLD, Osteoporosis, found by family unresponsive and brought to ER on 06/12/22 and found to have acute on chronic hypercapnia on ABG>Intubated in ER and treated for COPD exacerbation. She had prolonged complicated hospitalization charted as below with multiple ICU admissions, needing tracheostomy placement, having encephalopathy with baseline dementia and pulling out her PICC line tracheostomy.   Significant Events  3/20 Admitted after being found unresponsive, intubated on arrival to ED CT angio chest >> atherosclerosis, severe coronary calcification, severe LVH, mild centrilobular emphysema, 2 mm nodule LUL, 4 mm nodule LUL Blood culture  >> Haemophilus influenzae 3/22 Echo 06/14/22 >> EF 60 to 65%, mod LVH, grade 1 DD, mild/mod AR 3/25 bronch for peak pressures, low tidal volumes with mucus cast at end of ETT, thick secretions caking tube 3/29 started on Zosyn for broad coverage due to purulent secretions LLL with plugging and LLL atelectasis, increased O2 requirement on vent 4/2 family meeting, made DNR, plans for one way extubation 4/5 4/6-family changes mind regarding one-way extubation, plan will be for tracheostomy 4/8 trach placed 4/19 off vent x 72 hours so removed from room.  Planning on changing trach to cuffless.  Continuing to work with PMV.  Hypoglycemia adding D5 to maintenance fluids 4/26: Patient pulled tracheostomy tube but remained stable.  4/28: Patient pulled PICC line, PIV inserted, Soft restraints.   Subjective: Seen and examined earlier this morning.  Awake and alert.  Oriented  to self.  No complaints.  Safety sitter at bedside.  Objective: Vitals:   08/18/22 0607 08/18/22 0820 08/18/22 0854 08/18/22 0907  BP: (!) 135/90 (!) 156/68    Pulse: 77 75    Resp: 18 16    Temp: 97.7 F (36.5 C) 97.8 F (36.6 C)    TempSrc:      SpO2: 91% 96% 98% 96%  Weight:      Height:        Examination:  GENERAL: No apparent distress.  Nontoxic. HEENT: MMM.  Vision and hearing grossly intact.  NECK: Supple.  No apparent JVD.  RESP:  No IWOB.  On room air. SKIN: no apparent skin lesion or wound NEURO: Awake and alert.  Oriented to self.  Follows commands.  No focal neurodeficit. PSYCH: Calm.  No distress or agitation.   Microbiology summarized: COVID-19, influenza and RSV PCR nonreactive MRSA PCR screen nonreactive Urine culture with pansensitive E. Coli Blood culture with haemophilus influenza in 1 out of 2 bottles Repeat blood cultures negative Respiratory cultures with normal respiratory flora  Assessment and plan: Principal Problem:   Acute respiratory failure with hypoxia and hypercapnia (HCC) Active Problems:   COPD with exacerbation (HCC)   Hyperglycemia   Acute metabolic encephalopathy   HTN (hypertension)   Tobacco abuse   Anxiety   Peripheral vascular disease (HCC)   Pulmonary nodules   Dementia (HCC)   Protein-calorie malnutrition, severe (HCC)   Urinary retention   Hypervolemia   Encephalopathy acute   Tracheostomy in place (HCC)   AKI (acute kidney injury) (HCC)   Hypernatremia   Fever   Acute respiratory failure with hypercapnia (HCC)   Hyponatremia   Pressure  injury of skin   Acute hypoxic/hypercapnic respiratory failure: Intubation and mechanical ventilation from 3/20-4/5. Tracheostomy on 4/6. Pulled out trach on 4/26.  Trach site healed.  Currently on room air. -Change nebulizers to inhalers.   Acute metabolic encephalopathy in the background of dementia: based on previous reports it looks like at baseline patient has regular  conversations with herself and wanders out of the house.   Presented with unresponsiveness s/p extensive workup including  head CT negative for acute abnormality. Felt to be due to respiratory failure and also with ICU delirium.  Remains confused/disoriented.  Patient was seen by psychiatry and started on Seroquel.  Currently off benzos. -Dose of Seroquel increased to 75 mg qhs 5/15 -Reorientation and delirium precaution   Dysphagia-advanced to dysphagia 3 diet. -Continue dysphagia 3 diet   Multiple falls in the hospital :found on the floor on 5/2  and 5/8.  No serious injury.  Negative imaging. -Fall precaution  Pulmonary nodules: Incidental finding on CT 06/12/2022 - 2 mm and 4 mm noncalcified LUL lung nodules.   -May need outpatient surveillance depending on her prognosis and goals of care going forward.  Acute kidney injury: Resolved   Essential hypertension: Normotensive. -continue Norvasc 5 mg daily with Coreg 3.125 mg twice a day   Chronic diastolic CHF: Currently euvolemic   Hypokalemia: Resolved   Physical deconditioning: PT and OT has not seen her.  SNF is recommended.  Difficult to place.  Social worker is following.  Family looking into assisted living facilities/memory care unit   Severe Malnutrition Body mass index is 15.97 kg/m. Nutrition Problem: Severe Malnutrition Etiology: chronic illness (COPD, dementia) Signs/Symptoms: severe fat depletion, severe muscle depletion Interventions: Ensure Enlive (each supplement provides 350kcal and 20 grams of protein), Juven   DVT prophylaxis:  enoxaparin (LOVENOX) injection 30 mg Start: 07/02/22 1000  Code Status: DNR/DNI Family Communication: None at bedside Level of care: Med-Surg Status is: Inpatient Remains inpatient appropriate because: Placement   Final disposition: LOS/memory care? Consultants:  Pulmonology Psychiatry  25 minutes with more than 50% spent in reviewing records, counseling patient/family and  coordinating care.   Sch Meds:  Scheduled Meds:  amLODipine  5 mg Oral Daily   arformoterol  15 mcg Nebulization BID   budesonide (PULMICORT) nebulizer solution  0.5 mg Nebulization BID   carvedilol  3.125 mg Oral BID WC   enoxaparin (LOVENOX) injection  30 mg Subcutaneous Daily   feeding supplement  1 Container Oral BID BM   leptospermum manuka honey  1 Application Topical Daily   mupirocin cream   Topical BID   nicotine  21 mg Transdermal Daily   nutrition supplement (JUVEN)  1 packet Oral BID BM   mouth rinse  15 mL Mouth Rinse 4 times per day   pantoprazole  40 mg Oral Daily   QUEtiapine  25 mg Oral BID   QUEtiapine  75 mg Oral QHS   revefenacin  175 mcg Nebulization Daily   Continuous Infusions: PRN Meds:.acetaminophen **OR** acetaminophen, albuterol, haloperidol lactate, lip balm, ondansetron **OR** ondansetron (ZOFRAN) IV, mouth rinse, polyethylene glycol, senna  Antimicrobials: Anti-infectives (From admission, onward)    Start     Dose/Rate Route Frequency Ordered Stop   07/31/22 1030  doxycycline (VIBRA-TABS) tablet 100 mg        100 mg Oral Every 12 hours 07/31/22 0932 08/05/22 0959   07/06/22 1000  cefTRIAXone (ROCEPHIN) 2 g in sodium chloride 0.9 % 100 mL IVPB        2  g 200 mL/hr over 30 Minutes Intravenous Every 24 hours 07/06/22 0848 07/08/22 0952   07/05/22 0200  piperacillin-tazobactam (ZOSYN) IVPB 3.375 g  Status:  Discontinued        3.375 g 12.5 mL/hr over 240 Minutes Intravenous Every 8 hours 07/04/22 1826 07/06/22 0848   07/04/22 1915  vancomycin (VANCOCIN) IVPB 1000 mg/200 mL premix  Status:  Discontinued        1,000 mg 200 mL/hr over 60 Minutes Intravenous Every 48 hours 07/04/22 1826 07/05/22 0827   07/04/22 1915  piperacillin-tazobactam (ZOSYN) IVPB 3.375 g        3.375 g 100 mL/hr over 30 Minutes Intravenous  Once 07/04/22 1826 07/04/22 1914   06/21/22 2300  piperacillin-tazobactam (ZOSYN) IVPB 3.375 g        3.375 g 12.5 mL/hr over 240 Minutes  Intravenous Every 8 hours 06/21/22 1631 06/29/22 2009   06/21/22 1645  piperacillin-tazobactam (ZOSYN) IVPB 3.375 g  Status:  Discontinued        3.375 g 12.5 mL/hr over 240 Minutes Intravenous Every 8 hours 06/21/22 1630 06/21/22 1631   06/21/22 1645  piperacillin-tazobactam (ZOSYN) IVPB 3.375 g        3.375 g 100 mL/hr over 30 Minutes Intravenous STAT 06/21/22 1631 06/21/22 1900   06/14/22 1315  cefTRIAXone (ROCEPHIN) 2 g in sodium chloride 0.9 % 100 mL IVPB  Status:  Discontinued        2 g 200 mL/hr over 30 Minutes Intravenous Every 24 hours 06/14/22 1249 06/20/22 1415   06/12/22 2230  doxycycline (VIBRAMYCIN) 100 mg in sodium chloride 0.9 % 250 mL IVPB  Status:  Discontinued        100 mg 125 mL/hr over 120 Minutes Intravenous Every 12 hours 06/12/22 2139 06/14/22 1350        I have personally reviewed the following labs and images: CBC: No results for input(s): "WBC", "NEUTROABS", "HGB", "HCT", "MCV", "PLT" in the last 168 hours. BMP &GFR No results for input(s): "NA", "K", "CL", "CO2", "GLUCOSE", "BUN", "CREATININE", "CALCIUM", "MG", "PHOS" in the last 168 hours.  Invalid input(s): "GFRCG" Estimated Creatinine Clearance: 31.7 mL/min (by C-G formula based on SCr of 0.87 mg/dL). Liver & Pancreas: No results for input(s): "AST", "ALT", "ALKPHOS", "BILITOT", "PROT", "ALBUMIN" in the last 168 hours. No results for input(s): "LIPASE", "AMYLASE" in the last 168 hours. No results for input(s): "AMMONIA" in the last 168 hours. Diabetic: No results for input(s): "HGBA1C" in the last 72 hours. No results for input(s): "GLUCAP" in the last 168 hours. Cardiac Enzymes: No results for input(s): "CKTOTAL", "CKMB", "CKMBINDEX", "TROPONINI" in the last 168 hours. No results for input(s): "PROBNP" in the last 8760 hours. Coagulation Profile: No results for input(s): "INR", "PROTIME" in the last 168 hours. Thyroid Function Tests: No results for input(s): "TSH", "T4TOTAL", "FREET4",  "T3FREE", "THYROIDAB" in the last 72 hours. Lipid Profile: No results for input(s): "CHOL", "HDL", "LDLCALC", "TRIG", "CHOLHDL", "LDLDIRECT" in the last 72 hours. Anemia Panel: No results for input(s): "VITAMINB12", "FOLATE", "FERRITIN", "TIBC", "IRON", "RETICCTPCT" in the last 72 hours. Urine analysis:    Component Value Date/Time   COLORURINE YELLOW 07/04/2022 1612   APPEARANCEUR CLOUDY (A) 07/04/2022 1612   LABSPEC 1.014 07/04/2022 1612   PHURINE 8.0 07/04/2022 1612   GLUCOSEU NEGATIVE 07/04/2022 1612   HGBUR NEGATIVE 07/04/2022 1612   BILIRUBINUR NEGATIVE 07/04/2022 1612   KETONESUR NEGATIVE 07/04/2022 1612   PROTEINUR 30 (A) 07/04/2022 1612   NITRITE NEGATIVE 07/04/2022 1612   LEUKOCYTESUR LARGE (A)  07/04/2022 1612   Sepsis Labs: Invalid input(s): "PROCALCITONIN", "LACTICIDVEN"  Microbiology: No results found for this or any previous visit (from the past 240 hour(s)).  Radiology Studies: No results found.    Jarold Macomber T. Akeen Ledyard [facility]  If 7PM-7AM, please contact night-coverage [email or web address] 08/18/2022, 11:16 AM

## 2022-08-19 LAB — CBC
HCT: 48 % — ABNORMAL HIGH (ref 36.0–46.0)
Hemoglobin: 15 g/dL (ref 12.0–15.0)
MCH: 30.2 pg (ref 26.0–34.0)
MCHC: 31.3 g/dL (ref 30.0–36.0)
MCV: 96.6 fL (ref 80.0–100.0)
Platelets: 337 K/uL (ref 150–400)
RBC: 4.97 MIL/uL (ref 3.87–5.11)
RDW: 14.6 % (ref 11.5–15.5)
WBC: 7.6 K/uL (ref 4.0–10.5)
nRBC: 0 % (ref 0.0–0.2)

## 2022-08-19 LAB — RENAL FUNCTION PANEL
Anion gap: 8 (ref 5–15)
CO2: 24 mmol/L (ref 22–32)
Creatinine, Ser: 0.8 mg/dL (ref 0.44–1.00)
Glucose, Bld: 99 mg/dL (ref 70–99)
Phosphorus: 3.5 mg/dL (ref 2.5–4.6)
Sodium: 136 mmol/L (ref 135–145)

## 2022-08-19 LAB — MAGNESIUM: Magnesium: 2.3 mg/dL (ref 1.7–2.4)

## 2022-08-19 MED ORDER — CARVEDILOL 6.25 MG PO TABS
6.2500 mg | ORAL_TABLET | Freq: Two times a day (BID) | ORAL | Status: DC
  Administered 2022-08-19 – 2022-08-29 (×21): 6.25 mg via ORAL
  Filled 2022-08-19 (×21): qty 1

## 2022-08-19 NOTE — Progress Notes (Addendum)
Physical Therapy Treatment Patient Details Name: Carla [name] MRN: [id number] DOB: 1946-01-05 Today's Date: 08/19/2022   History of Present Illness 77 yo female admitted 3/20 unresponsive from home with acute respiratory failure with hypoxia and hypercapnia and metabolic encephalopathy. Intubated 3/20. trach 4/8. Self decannulated. PMhx: emphysema, tobacco abuse, PVD, HTN, prediabetes, anxiety, dementia    PT Comments    Pt admitted with above diagnosis. Pt met 3/4 goals and goals revised today.  Pt is progressing however limitations due to poor cognition and poor safety awareness. Will continue to progress pt as able.  Pt currently with functional limitations due to balance and endurance deficits. Pt will benefit from acute skilled PT to increase their independence and safety with mobility to allow discharge.      Recommendations for follow up therapy are one component of a multi-disciplinary discharge planning process, led by the attending physician.  Recommendations may be updated based on patient status, additional functional criteria and insurance authorization.  Follow Up Recommendations  Can patient physically be transported by private vehicle: Yes    Assistance Recommended at Discharge Frequent or constant Supervision/Assistance  Patient can return home with the following Direct supervision/assist for medications management;Assistance with cooking/housework;Assist for transportation;A little help with walking and/or transfers;A little help with bathing/dressing/bathroom   Equipment Recommendations  Rolling walker (2 wheels)    Recommendations for Other Services       Precautions / Restrictions Precautions Precautions: Fall Restrictions Weight Bearing Restrictions: No     Mobility  Bed Mobility Overal bed mobility: Needs Assistance Bed Mobility: Supine to Sit, Sit to Supine Rolling: Min guard Sidelying to sit: Min guard       General bed mobility comments:  increased time and effort required. cues  and assist for task initiation    Transfers Overall transfer level: Needs assistance Equipment used: 1 person hand held assist, None Transfers: Sit to/from Stand Sit to Stand: Min guard           General transfer comment: Min guard for safety. Needs max encouragment to participate.    Ambulation/Gait Ambulation/Gait assistance: Min guard Gait Distance (Feet): 125 Feet (125 x 2) Assistive device: 1 person hand held assist, None Gait Pattern/deviations: Step-through pattern, Narrow base of support Gait velocity: decreased Gait velocity interpretation: <1.31 ft/sec, indicative of household ambulator   General Gait Details: Difficult to motivate pt but pt  agreed to walk to desk to get soda.  Pt needed assistance required for steadying as patient has narrow base of support with mild unsteadiness. navigational cues also required with mobility. mild dyspnea at the end of the walking bout.  Pt took one seated rest break close to desk and then walked back to room.  Pt asking for food but she just ate lunch but seemed to not recall. Nurse stated she would bring her an [name] (Stroke Patients Only)       Balance Overall balance assessment: Needs assistance Sitting-balance support: Feet supported Sitting balance-Leahy Scale: Fair Sitting balance - Comments: Intermittent UE support   Standing balance support: Single extremity supported Standing balance-Leahy Scale: Poor Standing balance comment: external guarding required                            Cognition Arousal/Alertness: Awake/alert Behavior During Therapy: Flat affect Overall Cognitive Status: No family/caregiver present to determine  baseline cognitive functioning Area of Impairment: Attention, Following commands, Problem solving, Orientation, Memory, Safety/judgement, Awareness                  Orientation Level: Disoriented to, Place, Time, Situation Current Attention Level: Sustained Memory: Decreased short-term memory, Decreased recall of precautions Following Commands: Follows one step commands consistently Safety/Judgement: Decreased awareness of safety, Decreased awareness of deficits Awareness: Intellectual Problem Solving: Slow processing, Requires verbal cues, Decreased initiation General Comments: Following commands more consistently and not requiring multimodal cues        Exercises Other Exercises Other Exercises: supine BLE AROM: ankle pumps x10    General Comments General comments (skin integrity, edema, etc.): VSS      Pertinent Vitals/Pain Pain Assessment Pain Assessment: No/denies pain    Home Living                          Prior Function            PT Goals (current goals can now be found in the care plan section) Acute Rehab PT Goals PT Goal Formulation: Patient unable to participate in goal setting Time For Goal Achievement: 09/02/22 Potential to Achieve Goals: Good Progress towards PT goals: Progressing toward goals    Frequency    Min 2X/week      PT Plan Current plan remains appropriate    Co-evaluation              AM-PAC PT "6 Clicks" Mobility   Outcome Measure  Help needed turning from your back to your side while in a flat bed without using bedrails?: A Little Help needed moving from lying on your back to sitting on the side of a flat bed without using bedrails?: A Little Help needed moving to and from a bed to a chair (including a wheelchair)?: A Little Help needed standing up from a chair using your arms (e.g., wheelchair or bedside chair)?: A Little Help needed to walk in hospital room?: A Little Help needed climbing 3-5 steps with a railing? : A Little 6 Click Score: 18    End of Session Equipment Utilized During Treatment: Gait belt Activity Tolerance: Patient limited by fatigue Patient left:  in bed;with call bell/phone within reach;with bed alarm set;with nursing/sitter in room (1:1 sitter in the room) Nurse Communication: Mobility status PT Visit Diagnosis: Other abnormalities of gait and mobility (R26.89);Muscle weakness (generalized) (M62.81);Difficulty in walking, not elsewhere classified (R26.2)     Time: 1610-9604 PT Time Calculation (min) (ACUTE ONLY): 13 min  Charges:  $Gait Training: 8-22 mins                     Dawn M,PT Acute Rehab Services [phone]    Carla [name] 08/19/2022, 2:54 PM

## 2022-08-19 NOTE — Progress Notes (Addendum)
PROGRESS NOTE  Carla [name] XBJ:[id number] DOB: 1945/06/22   PCP: Patient, [name]  Patient is from:   DOA: 06/12/2022 LOS: 68  Chief complaints Chief Complaint  Patient presents with   Altered Mental Status     Brief Narrative / Interim history: 76 yof w/ COPD/emphysema tobacco use, Hypertension, Dementia, Anxiety, Depression, GERD, Primary hyperparathyroidism, Vit D deficiency, HLD, Osteoporosis, found by family unresponsive and brought to ER on 06/12/22 and found to have acute on chronic hypercapnia on ABG>Intubated in ER and treated for COPD exacerbation. She had prolonged complicated hospitalization charted as below with multiple ICU admissions, needing tracheostomy placement, having encephalopathy with baseline dementia and pulling out her PICC line tracheostomy.   Significant Events  3/20 Admitted after being found unresponsive, intubated on arrival to ED CT angio chest >> atherosclerosis, severe coronary calcification, severe LVH, mild centrilobular emphysema, 2 mm nodule LUL, 4 mm nodule LUL Blood culture  >> Haemophilus influenzae 3/22 Echo 06/14/22 >> EF 60 to 65%, mod LVH, grade 1 DD, mild/mod AR 3/25 bronch for peak pressures, low tidal volumes with mucus cast at end of ETT, thick secretions caking tube 3/29 started on Zosyn for broad coverage due to purulent secretions LLL with plugging and LLL atelectasis, increased O2 requirement on vent 4/2 family meeting, made DNR, plans for one way extubation 4/5 4/6-family changes mind regarding one-way extubation, plan will be for tracheostomy 4/8 trach placed 4/19 off vent x 72 hours so removed from room.  Planning on changing trach to cuffless.  Continuing to work with PMV.  Hypoglycemia adding D5 to maintenance fluids 4/26: Patient pulled tracheostomy tube but remained stable.  4/28: Patient pulled PICC line, PIV inserted, Soft restraints.   Subjective: Seen and examined earlier this morning.  Sleepy after breakfast.   Patient reports feeling hungry and thirsty but doesn't seem to eat much of her meals.  Objective: Vitals:   08/18/22 1628 08/18/22 2043 08/19/22 0551 08/19/22 0830  BP: (!) 144/75 (!) 141/71 (!) 151/77 133/64  Pulse: 81 85 76 85  Resp: 18 18 18 20   Temp: (!) 97.5 F (36.4 C) 97.9 F (36.6 C) (!) 97.5 F (36.4 C) 97.7 F (36.5 C)  TempSrc: Oral   Oral  SpO2: 95% 95% 96% 92%  Weight:      Height:        Examination:  GENERAL: No apparent distress.  Nontoxic. HEENT: MMM.  Vision and hearing grossly intact.  NECK: Supple.  No apparent JVD.  RESP:  No IWOB.  On room air. SKIN: no apparent skin lesion or wound NEURO: Sleeping. PSYCH: Calm.  No distress or agitation.   Microbiology summarized: COVID-19, influenza and RSV PCR nonreactive MRSA PCR screen nonreactive Urine culture with pansensitive E. Coli Blood culture with haemophilus influenza in 1 out of 2 bottles Repeat blood cultures negative Respiratory cultures with normal respiratory flora  Assessment and plan: Principal Problem:   Acute respiratory failure with hypoxia and hypercapnia (HCC) Active Problems:   COPD with exacerbation (HCC)   Hyperglycemia   Acute metabolic encephalopathy   HTN (hypertension)   Tobacco abuse   Anxiety   Peripheral vascular disease (HCC)   Pulmonary nodules   Dementia (HCC)   Protein-calorie malnutrition, severe (HCC)   Urinary retention   Hypervolemia   Encephalopathy acute   Tracheostomy in place (HCC)   AKI (acute kidney injury) (HCC)   Hypernatremia   Fever   Acute respiratory failure with hypercapnia (HCC)   Hyponatremia   Pressure  injury of skin   Acute hypoxic/hypercapnic respiratory failure: Intubation and mechanical ventilation from 3/20-4/5. Tracheostomy on 4/6. Pulled out trach on 4/26.  Trach site healed.  Currently on room air. -Change nebulizers to inhalers.   Acute metabolic encephalopathy in the background of dementia: based on previous reports it looks  like at baseline patient has regular conversations with herself and wanders out of the house.   Presented with unresponsiveness s/p extensive workup including  head CT negative for acute abnormality. Felt to be due to respiratory failure and also with ICU delirium.  Remains confused/disoriented.  Patient was seen by psychiatry and started on Seroquel.  Currently off benzos. -Dose of Seroquel increased to 75 mg qhs 5/15 -Reorientation and delirium precaution   Dysphagia-advanced to dysphagia 3 diet. -Continue dysphagia 3 diet   Multiple falls in the hospital :found on the floor on 5/2  and 5/8.  No serious injury.  Negative imaging. -Fall precaution  Pulmonary nodules: Incidental finding on CT 06/12/2022 - 2 mm and 4 mm noncalcified LUL lung nodules.   -May need outpatient surveillance depending on her prognosis and goals of care going forward.  Acute kidney injury: Resolved   Essential hypertension: Normotensive. -continue Norvasc 5 mg daily with Coreg 3.125 mg twice a day   Chronic diastolic CHF: Currently euvolemic   Hypokalemia: Resolved   Physical deconditioning: PT and OT has not seen her.  SNF is recommended.  Difficult to place.  Social worker is following.  Family looking into assisted living facilities/memory care unit   Severe Malnutrition Body mass index is 15.97 kg/m. Nutrition Problem: Severe Malnutrition Etiology: chronic illness (COPD, dementia) Signs/Symptoms: severe fat depletion, severe muscle depletion Interventions: Ensure Enlive (each supplement provides 350kcal and 20 grams of protein), Juven   DVT prophylaxis:  enoxaparin (LOVENOX) injection 30 mg Start: 07/02/22 1000  Code Status: DNR/DNI Family Communication: None at bedside Level of care: Med-Surg Status is: Inpatient Remains inpatient appropriate because: Placement   Final disposition: LOS/memory care? Consultants:  Pulmonology Psychiatry  25 minutes with more than 50% spent in reviewing  records, counseling patient/family and coordinating care.   Sch Meds:  Scheduled Meds:  amLODipine  5 mg Oral Daily   carvedilol  6.25 mg Oral BID WC   enoxaparin (LOVENOX) injection  30 mg Subcutaneous Daily   feeding supplement  1 Container Oral BID BM   leptospermum manuka honey  1 Application Topical Daily   mometasone-formoterol  2 puff Inhalation BID   mupirocin cream   Topical BID   nicotine  21 mg Transdermal Daily   nutrition supplement (JUVEN)  1 packet Oral BID BM   mouth rinse  15 mL Mouth Rinse 4 times per day   pantoprazole  40 mg Oral Daily   QUEtiapine  25 mg Oral BID   QUEtiapine  75 mg Oral QHS   umeclidinium bromide  1 puff Inhalation Daily   Continuous Infusions: PRN Meds:.acetaminophen **OR** acetaminophen, albuterol, haloperidol lactate, lip balm, ondansetron **OR** ondansetron (ZOFRAN) IV, mouth rinse, polyethylene glycol, senna  Antimicrobials: Anti-infectives (From admission, onward)    Start     Dose/Rate Route Frequency Ordered Stop   07/31/22 1030  doxycycline (VIBRA-TABS) tablet 100 mg        100 mg Oral Every 12 hours 07/31/22 0932 08/05/22 0959   07/06/22 1000  cefTRIAXone (ROCEPHIN) 2 g in sodium chloride 0.9 % 100 mL IVPB        2 g 200 mL/hr over 30 Minutes Intravenous Every 24 hours  07/06/22 0848 07/08/22 0952   07/05/22 0200  piperacillin-tazobactam (ZOSYN) IVPB 3.375 g  Status:  Discontinued        3.375 g 12.5 mL/hr over 240 Minutes Intravenous Every 8 hours 07/04/22 1826 07/06/22 0848   07/04/22 1915  vancomycin (VANCOCIN) IVPB 1000 mg/200 mL premix  Status:  Discontinued        1,000 mg 200 mL/hr over 60 Minutes Intravenous Every 48 hours 07/04/22 1826 07/05/22 0827   07/04/22 1915  piperacillin-tazobactam (ZOSYN) IVPB 3.375 g        3.375 g 100 mL/hr over 30 Minutes Intravenous  Once 07/04/22 1826 07/04/22 1914   06/21/22 2300  piperacillin-tazobactam (ZOSYN) IVPB 3.375 g        3.375 g 12.5 mL/hr over 240 Minutes Intravenous Every 8  hours 06/21/22 1631 06/29/22 2009   06/21/22 1645  piperacillin-tazobactam (ZOSYN) IVPB 3.375 g  Status:  Discontinued        3.375 g 12.5 mL/hr over 240 Minutes Intravenous Every 8 hours 06/21/22 1630 06/21/22 1631   06/21/22 1645  piperacillin-tazobactam (ZOSYN) IVPB 3.375 g        3.375 g 100 mL/hr over 30 Minutes Intravenous STAT 06/21/22 1631 06/21/22 1900   06/14/22 1315  cefTRIAXone (ROCEPHIN) 2 g in sodium chloride 0.9 % 100 mL IVPB  Status:  Discontinued        2 g 200 mL/hr over 30 Minutes Intravenous Every 24 hours 06/14/22 1249 06/20/22 1415   06/12/22 2230  doxycycline (VIBRAMYCIN) 100 mg in sodium chloride 0.9 % 250 mL IVPB  Status:  Discontinued        100 mg 125 mL/hr over 120 Minutes Intravenous Every 12 hours 06/12/22 2139 06/14/22 1350        I have personally reviewed the following labs and images: CBC: Recent Labs  Lab 08/19/22 0655  WBC 7.6  HGB 15.0  HCT 48.0*  MCV 96.6  PLT 337   BMP &GFR Recent Labs  Lab 08/19/22 0655  NA 136  K 4.0  CL 104  CO2 24  GLUCOSE 99  BUN 25*  CREATININE 0.80  CALCIUM 9.5  MG 2.3  PHOS 3.5   Estimated Creatinine Clearance: 34.5 mL/min (by C-G formula based on SCr of 0.8 mg/dL). Liver & Pancreas: Recent Labs  Lab 08/19/22 0655  ALBUMIN 2.9*   No results for input(s): "LIPASE", "AMYLASE" in the last 168 hours. No results for input(s): "AMMONIA" in the last 168 hours. Diabetic: No results for input(s): "HGBA1C" in the last 72 hours. No results for input(s): "GLUCAP" in the last 168 hours. Cardiac Enzymes: No results for input(s): "CKTOTAL", "CKMB", "CKMBINDEX", "TROPONINI" in the last 168 hours. No results for input(s): "PROBNP" in the last 8760 hours. Coagulation Profile: No results for input(s): "INR", "PROTIME" in the last 168 hours. Thyroid Function Tests: No results for input(s): "TSH", "T4TOTAL", "FREET4", "T3FREE", "THYROIDAB" in the last 72 hours. Lipid Profile: No results for input(s): "CHOL",  "HDL", "LDLCALC", "TRIG", "CHOLHDL", "LDLDIRECT" in the last 72 hours. Anemia Panel: No results for input(s): "VITAMINB12", "FOLATE", "FERRITIN", "TIBC", "IRON", "RETICCTPCT" in the last 72 hours. Urine analysis:    Component Value Date/Time   COLORURINE YELLOW 07/04/2022 1612   APPEARANCEUR CLOUDY (A) 07/04/2022 1612   LABSPEC 1.014 07/04/2022 1612   PHURINE 8.0 07/04/2022 1612   GLUCOSEU NEGATIVE 07/04/2022 1612   HGBUR NEGATIVE 07/04/2022 1612   BILIRUBINUR NEGATIVE 07/04/2022 1612   KETONESUR NEGATIVE 07/04/2022 1612   PROTEINUR 30 (A) 07/04/2022 1612  NITRITE NEGATIVE 07/04/2022 1612   LEUKOCYTESUR LARGE (A) 07/04/2022 1612   Sepsis Labs: Invalid input(s): "PROCALCITONIN", "LACTICIDVEN"  Microbiology: No results found for this or any previous visit (from the past 240 hour(s)).  Radiology Studies: No results found.    Josclyn Rosales T. Jadon Ressler [facility]  If 7PM-7AM, please contact night-coverage [email or web address] 08/19/2022, 1:56 PM

## 2022-08-19 NOTE — Progress Notes (Incomplete Revision)
Physical Therapy Treatment Patient Details Name: Carla [name] MRN: [id number] DOB: 1946-01-05 Today's Date: 08/19/2022   History of Present Illness 77 yo female admitted 3/20 unresponsive from home with acute respiratory failure with hypoxia and hypercapnia and metabolic encephalopathy. Intubated 3/20. trach 4/8. Self decannulated. PMhx: emphysema, tobacco abuse, PVD, HTN, prediabetes, anxiety, dementia    PT Comments    Pt admitted with above diagnosis. Pt met 3/4 goals and goals revised today.  Pt is progressing however limitations due to poor cognition and poor safety awareness. Will continue to progress pt as able.  Pt currently with functional limitations due to balance and endurance deficits. Pt will benefit from acute skilled PT to increase their independence and safety with mobility to allow discharge.      Recommendations for follow up therapy are one component of a multi-disciplinary discharge planning process, led by the attending physician.  Recommendations may be updated based on patient status, additional functional criteria and insurance authorization.  Follow Up Recommendations  Can patient physically be transported by private vehicle: Yes    Assistance Recommended at Discharge Frequent or constant Supervision/Assistance  Patient can return home with the following Direct supervision/assist for medications management;Assistance with cooking/housework;Assist for transportation;A little help with walking and/or transfers;A little help with bathing/dressing/bathroom   Equipment Recommendations  Rolling walker (2 wheels)    Recommendations for Other Services       Precautions / Restrictions Precautions Precautions: Fall Restrictions Weight Bearing Restrictions: No     Mobility  Bed Mobility Overal bed mobility: Needs Assistance Bed Mobility: Supine to Sit, Sit to Supine Rolling: Min guard Sidelying to sit: Min guard       General bed mobility comments:  increased time and effort required. cues  and assist for task initiation    Transfers Overall transfer level: Needs assistance Equipment used: 1 person hand held assist, None Transfers: Sit to/from Stand Sit to Stand: Min guard           General transfer comment: Min guard for safety. Needs max encouragment to participate.    Ambulation/Gait Ambulation/Gait assistance: Min guard Gait Distance (Feet): 125 Feet (125 x 2) Assistive device: 1 person hand held assist, None Gait Pattern/deviations: Step-through pattern, Narrow base of support Gait velocity: decreased Gait velocity interpretation: <1.31 ft/sec, indicative of household ambulator   General Gait Details: Difficult to motivate pt but pt  agreed to walk to desk to get soda.  Pt needed assistance required for steadying as patient has narrow base of support with mild unsteadiness. navigational cues also required with mobility. mild dyspnea at the end of the walking bout.  Pt took one seated rest break close to desk and then walked back to room.  Pt asking for food but she just ate lunch but seemed to not recall. Nurse stated she would bring her an [name] (Stroke Patients Only)       Balance Overall balance assessment: Needs assistance Sitting-balance support: Feet supported Sitting balance-Leahy Scale: Fair Sitting balance - Comments: Intermittent UE support   Standing balance support: Single extremity supported Standing balance-Leahy Scale: Poor Standing balance comment: external guarding required                            Cognition Arousal/Alertness: Awake/alert Behavior During Therapy: Flat affect Overall Cognitive Status: No family/caregiver present to determine  baseline cognitive functioning Area of Impairment: Attention, Following commands, Problem solving, Orientation, Memory, Safety/judgement, Awareness                  Orientation Level: Disoriented to, Place, Time, Situation Current Attention Level: Sustained Memory: Decreased short-term memory, Decreased recall of precautions Following Commands: Follows one step commands consistently Safety/Judgement: Decreased awareness of safety, Decreased awareness of deficits Awareness: Intellectual Problem Solving: Slow processing, Requires verbal cues, Decreased initiation General Comments: Following commands more consistently and not requiring multimodal cues        Exercises Other Exercises Other Exercises: supine BLE AROM: ankle pumps x10    General Comments General comments (skin integrity, edema, etc.): VSS      Pertinent Vitals/Pain Pain Assessment Pain Assessment: No/denies pain    Home Living                          Prior Function            PT Goals (current goals can now be found in the care plan section) Acute Rehab PT Goals PT Goal Formulation: Patient unable to participate in goal setting Time For Goal Achievement: 09/02/22 Potential to Achieve Goals: Good Progress towards PT goals: Progressing toward goals    Frequency    Min 2X/week      PT Plan Current plan remains appropriate    Co-evaluation              AM-PAC PT "6 Clicks" Mobility   Outcome Measure  Help needed turning from your back to your side while in a flat bed without using bedrails?: A Little Help needed moving from lying on your back to sitting on the side of a flat bed without using bedrails?: A Little Help needed moving to and from a bed to a chair (including a wheelchair)?: A Little Help needed standing up from a chair using your arms (e.g., wheelchair or bedside chair)?: A Little Help needed to walk in hospital room?: A Little Help needed climbing 3-5 steps with a railing? : A Little 6 Click Score: 18    End of Session Equipment Utilized During Treatment: Gait belt Activity Tolerance: Patient limited by fatigue Patient left:  in bed;with call bell/phone within reach;with bed alarm set;with nursing/sitter in room (1:1 sitter in the room) Nurse Communication: Mobility status PT Visit Diagnosis: Other abnormalities of gait and mobility (R26.89);Muscle weakness (generalized) (M62.81);Difficulty in walking, not elsewhere classified (R26.2)     Time: 1610-9604 PT Time Calculation (min) (ACUTE ONLY): 13 min  Charges:  $Gait Training: 8-22 mins                     Dawn M,PT Acute Rehab Services [phone]    Bevelyn Buckles 08/19/2022, 2:54 PM

## 2022-08-19 NOTE — TOC Progression Note (Addendum)
Transition of Care (TOC) - Progression Note    Patient Details  Name:  C  MRN:  Date of Birth:   Transition of Care (TOC) CM/SW Contact  Morton Simson L Narcisa Ganesh, LCSW Phone Number: 08/19/2022, 8:55 AM  Clinical Narrative:    CSW attempted to reach patient's granddaughter Amanda twice without success - a voicemail was left requesting a return call.  CSW spoke with patient's husband Douglas who is also admitted at [facility]. Douglas states CSW needs to speak with Amanda for updates as he is not certain on where she is at with obtaining memory care placement.   Expected Discharge Plan: Skilled Nursing Facility Barriers to Discharge: Continued Medical Work up  Expected Discharge Plan and Services   Discharge Planning Services: CM Consult Post Acute Care Choice: Long Term Acute Care (LTAC)                                         Social Determinants of Health (SDOH) Interventions SDOH Screenings   Food Insecurity: No Food Insecurity (07/28/2022)  Housing: Low Risk  (07/28/2022)  Transportation Needs: No Transportation Needs (07/28/2022)  Utilities: Not At Risk (07/28/2022)  Tobacco Use: High Risk (07/28/2022)    Readmission Risk Interventions    07/22/2022    1:43 PM  Readmission Risk Prevention Plan  Transportation Screening Complete  Palliative Care Screening Complete    

## 2022-08-20 DIAGNOSIS — G9340 Encephalopathy, unspecified: Secondary | ICD-10-CM | POA: Diagnosis not present

## 2022-08-20 DIAGNOSIS — Z720 Tobacco use: Secondary | ICD-10-CM

## 2022-08-20 DIAGNOSIS — J441 Chronic obstructive pulmonary disease with (acute) exacerbation: Secondary | ICD-10-CM

## 2022-08-20 DIAGNOSIS — F03918 Unspecified dementia, unspecified severity, with other behavioral disturbance: Secondary | ICD-10-CM | POA: Diagnosis not present

## 2022-08-20 DIAGNOSIS — I10 Essential (primary) hypertension: Secondary | ICD-10-CM | POA: Diagnosis not present

## 2022-08-20 DIAGNOSIS — R918 Other nonspecific abnormal finding of lung field: Secondary | ICD-10-CM

## 2022-08-20 DIAGNOSIS — E43 Unspecified severe protein-calorie malnutrition: Secondary | ICD-10-CM

## 2022-08-20 DIAGNOSIS — F419 Anxiety disorder, unspecified: Secondary | ICD-10-CM | POA: Diagnosis not present

## 2022-08-20 DIAGNOSIS — J9601 Acute respiratory failure with hypoxia: Secondary | ICD-10-CM | POA: Diagnosis not present

## 2022-08-20 DIAGNOSIS — J9602 Acute respiratory failure with hypercapnia: Secondary | ICD-10-CM

## 2022-08-20 DIAGNOSIS — I739 Peripheral vascular disease, unspecified: Secondary | ICD-10-CM

## 2022-08-20 DIAGNOSIS — N179 Acute kidney failure, unspecified: Secondary | ICD-10-CM

## 2022-08-20 NOTE — Progress Notes (Addendum)
PROGRESS NOTE  Carla [name] ZOX:[id number] DOB: [birth year]   PCP: Patient, [name]  Patient is from:   DOA: 06/12/2022 LOS: 69  Chief complaints Chief Complaint  Patient presents with   Altered Mental Status     Brief Narrative / Interim history: 42 yof w/ COPD/emphysema tobacco use, Hypertension, Dementia, Anxiety, Depression, GERD, Primary hyperparathyroidism, Vit D deficiency, HLD, Osteoporosis, found by family unresponsive and brought to ER on 06/12/22 and found to have acute on chronic hypercapnia on ABG>Intubated in ER and treated for COPD exacerbation. She had prolonged complicated hospitalization charted as below with multiple ICU admissions, needing tracheostomy placement, having encephalopathy with baseline dementia and pulling out her PICC line tracheostomy.   Significant Events  3/20 Admitted after being found unresponsive, intubated on arrival to ED CT angio chest >> atherosclerosis, severe coronary calcification, severe LVH, mild centrilobular emphysema, 2 mm nodule LUL, 4 mm nodule LUL Blood culture  >> Haemophilus influenzae 3/22 Echo 06/14/22 >> EF 60 to 65%, mod LVH, grade 1 DD, mild/mod AR 3/25 bronch for peak pressures, low tidal volumes with mucus cast at end of ETT, thick secretions caking tube 3/29 started on Zosyn for broad coverage due to purulent secretions LLL with plugging and LLL atelectasis, increased O2 requirement on vent 4/2 family meeting, made DNR, plans for one way extubation 4/5 4/6-family changes mind regarding one-way extubation, plan will be for tracheostomy 4/8 trach placed 4/19 off vent x 72 hours so removed from room.  Planning on changing trach to cuffless.  Continuing to work with PMV.  Hypoglycemia adding D5 to maintenance fluids 4/26: Patient pulled tracheostomy tube but remained stable.  4/28: Patient pulled PICC line, PIV inserted, Soft restraints.   Subjective: Seen and examined earlier this morning.  Sleeping.  Safety sitter at  bedside.  Objective: Vitals:   08/19/22 1924 08/20/22 0400 08/20/22 0726 08/20/22 0827  BP: 138/71 (!) 148/67  139/78  Pulse: 87 70  74  Resp: 16 16  16   Temp: 97.7 F (36.5 C) (!) 97.5 F (36.4 C)    TempSrc:      SpO2: 95% 94% 93% 94%  Weight:      Height:        Examination:  GENERAL: No apparent distress.  Nontoxic. HEENT: MMM.  Vision and hearing grossly intact.  NECK: Supple.  No apparent JVD.  RESP:  No IWOB.  On room air. SKIN: no apparent skin lesion or wound NEURO: Sleeping. PSYCH: Calm.  No distress or agitation.   Microbiology summarized: COVID-19, influenza and RSV PCR nonreactive MRSA PCR screen nonreactive Urine culture with pansensitive E. Coli Blood culture with haemophilus influenza in 1 out of 2 bottles Repeat blood cultures negative Respiratory cultures with normal respiratory flora  Assessment and plan: Principal Problem:   Acute respiratory failure with hypoxia and hypercapnia (HCC) Active Problems:   COPD with exacerbation (HCC)   Hyperglycemia   Acute metabolic encephalopathy   HTN (hypertension)   Tobacco abuse   Anxiety   Peripheral vascular disease (HCC)   Pulmonary nodules   Dementia (HCC)   Protein-calorie malnutrition, severe (HCC)   Urinary retention   Hypervolemia   Encephalopathy acute   Tracheostomy in place [facility])   AKI (acute kidney injury) (HCC)   Hypernatremia   Fever   Acute respiratory failure with hypercapnia (HCC)   Hyponatremia   Pressure injury of skin   Acute hypoxic/hypercapnic respiratory failure: Intubation and mechanical ventilation from 3/20-4/5. Tracheostomy on 4/6. Pulled out trach on  4/26Janina Mayo site healed.  Currently on room air. -Continue inhalers.   Acute metabolic encephalopathy in the background of dementia: based on previous reports it looks like at baseline patient has regular conversations with herself and wanders out of the house.   Presented with unresponsiveness s/p extensive workup  including  head CT negative for acute abnormality. Felt to be due to respiratory failure and also with ICU delirium.  Remains confused/disoriented.  Patient was seen by psychiatry and started on Seroquel.  Currently off benzos. -Dose of Seroquel increased to 75 mg qhs 5/15 -Reorientation and delirium precaution   Dysphagia-advanced to dysphagia 3 diet. -Continue dysphagia 3 diet   Multiple falls in the hospital :found on the floor on 5/2  and 5/8.  No serious injury.  Negative imaging. -Fall precaution  Pulmonary nodules: Incidental finding on CT 06/12/2022 - 2 mm and 4 mm noncalcified LUL lung nodules.   -May need outpatient surveillance depending on her prognosis and goals of care going forward.  Acute kidney injury: Resolved   Essential hypertension: Normotensive. -continue Norvasc 5 mg daily with Coreg 3.125 mg twice a day   Chronic diastolic CHF: Currently euvolemic   Hypokalemia: Resolved   Physical deconditioning: PT and OT has not seen her.  SNF is recommended.  Difficult to place.  Social worker is following.  Family looking into assisted living facilities/memory care unit   Severe Malnutrition Body mass index is 15.97 kg/m. Nutrition Problem: Severe Malnutrition Etiology: chronic illness (COPD, dementia) Signs/Symptoms: severe fat depletion, severe muscle depletion Interventions: Ensure Enlive (each supplement provides 350kcal and 20 grams of protein), Juven   DVT prophylaxis:  enoxaparin (LOVENOX) injection 30 mg Start: 07/02/22 1000  Code Status: DNR/DNI Family Communication: None at bedside Level of care: Med-Surg Status is: Inpatient Remains inpatient appropriate because: Placement   Final disposition: LOS/memory care? Consultants:  Pulmonology Psychiatry  25 minutes with more than 50% spent in reviewing records, counseling patient/family and coordinating care.   Sch Meds:  Scheduled Meds:  amLODipine  5 mg Oral Daily   carvedilol  6.25 mg Oral BID WC    enoxaparin (LOVENOX) injection  30 mg Subcutaneous Daily   feeding supplement  1 Container Oral BID BM   leptospermum manuka honey  1 Application Topical Daily   mometasone-formoterol  2 puff Inhalation BID   mupirocin cream   Topical BID   nicotine  21 mg Transdermal Daily   nutrition supplement (JUVEN)  1 packet Oral BID BM   mouth rinse  15 mL Mouth Rinse 4 times per day   pantoprazole  40 mg Oral Daily   QUEtiapine  25 mg Oral BID   QUEtiapine  75 mg Oral QHS   umeclidinium bromide  1 puff Inhalation Daily   Continuous Infusions: PRN Meds:.acetaminophen **OR** acetaminophen, albuterol, haloperidol lactate, lip balm, ondansetron **OR** ondansetron (ZOFRAN) IV, mouth rinse, polyethylene glycol, senna  Antimicrobials: Anti-infectives (From admission, onward)    Start     Dose/Rate Route Frequency Ordered Stop   07/31/22 1030  doxycycline (VIBRA-TABS) tablet 100 mg        100 mg Oral Every 12 hours 07/31/22 0932 08/05/22 0959   07/06/22 1000  cefTRIAXone (ROCEPHIN) 2 g in sodium chloride 0.9 % 100 mL IVPB        2 g 200 mL/hr over 30 Minutes Intravenous Every 24 hours 07/06/22 0848 07/08/22 0952   07/05/22 0200  piperacillin-tazobactam (ZOSYN) IVPB 3.375 g  Status:  Discontinued  3.375 g 12.5 mL/hr over 240 Minutes Intravenous Every 8 hours 07/04/22 1826 07/06/22 0848   07/04/22 1915  vancomycin (VANCOCIN) IVPB 1000 mg/200 mL premix  Status:  Discontinued        1,000 mg 200 mL/hr over 60 Minutes Intravenous Every 48 hours 07/04/22 1826 07/05/22 0827   07/04/22 1915  piperacillin-tazobactam (ZOSYN) IVPB 3.375 g        3.375 g 100 mL/hr over 30 Minutes Intravenous  Once 07/04/22 1826 07/04/22 1914   06/21/22 2300  piperacillin-tazobactam (ZOSYN) IVPB 3.375 g        3.375 g 12.5 mL/hr over 240 Minutes Intravenous Every 8 hours 06/21/22 1631 06/29/22 2009   06/21/22 1645  piperacillin-tazobactam (ZOSYN) IVPB 3.375 g  Status:  Discontinued        3.375 g 12.5 mL/hr over  240 Minutes Intravenous Every 8 hours 06/21/22 1630 06/21/22 1631   06/21/22 1645  piperacillin-tazobactam (ZOSYN) IVPB 3.375 g        3.375 g 100 mL/hr over 30 Minutes Intravenous STAT 06/21/22 1631 06/21/22 1900   06/14/22 1315  cefTRIAXone (ROCEPHIN) 2 g in sodium chloride 0.9 % 100 mL IVPB  Status:  Discontinued        2 g 200 mL/hr over 30 Minutes Intravenous Every 24 hours 06/14/22 1249 06/20/22 1415   06/12/22 2230  doxycycline (VIBRAMYCIN) 100 mg in sodium chloride 0.9 % 250 mL IVPB  Status:  Discontinued        100 mg 125 mL/hr over 120 Minutes Intravenous Every 12 hours 06/12/22 2139 06/14/22 1350        I have personally reviewed the following labs and images: CBC: Recent Labs  Lab 08/19/22 0655  WBC 7.6  HGB 15.0  HCT 48.0*  MCV 96.6  PLT 337   BMP &GFR Recent Labs  Lab 08/19/22 0655  NA 136  K 4.0  CL 104  CO2 24  GLUCOSE 99  BUN 25*  CREATININE 0.80  CALCIUM 9.5  MG 2.3  PHOS 3.5   Estimated Creatinine Clearance: 34.5 mL/min (by C-G formula based on SCr of 0.8 mg/dL). Liver & Pancreas: Recent Labs  Lab 08/19/22 0655  ALBUMIN 2.9*   No results for input(s): "LIPASE", "AMYLASE" in the last 168 hours. No results for input(s): "AMMONIA" in the last 168 hours. Diabetic: No results for input(s): "HGBA1C" in the last 72 hours. No results for input(s): "GLUCAP" in the last 168 hours. Cardiac Enzymes: No results for input(s): "CKTOTAL", "CKMB", "CKMBINDEX", "TROPONINI" in the last 168 hours. No results for input(s): "PROBNP" in the last 8760 hours. Coagulation Profile: No results for input(s): "INR", "PROTIME" in the last 168 hours. Thyroid Function Tests: No results for input(s): "TSH", "T4TOTAL", "FREET4", "T3FREE", "THYROIDAB" in the last 72 hours. Lipid Profile: No results for input(s): "CHOL", "HDL", "LDLCALC", "TRIG", "CHOLHDL", "LDLDIRECT" in the last 72 hours. Anemia Panel: No results for input(s): "VITAMINB12", "FOLATE", "FERRITIN", "TIBC",  "IRON", "RETICCTPCT" in the last 72 hours. Urine analysis:    Component Value Date/Time   COLORURINE YELLOW 07/04/2022 1612   APPEARANCEUR CLOUDY (A) 07/04/2022 1612   LABSPEC 1.014 07/04/2022 1612   PHURINE 8.0 07/04/2022 1612   GLUCOSEU NEGATIVE 07/04/2022 1612   HGBUR NEGATIVE 07/04/2022 1612   BILIRUBINUR NEGATIVE 07/04/2022 1612   KETONESUR NEGATIVE 07/04/2022 1612   PROTEINUR 30 (A) 07/04/2022 1612   NITRITE NEGATIVE 07/04/2022 1612   LEUKOCYTESUR LARGE (A) 07/04/2022 1612   Sepsis Labs: Invalid input(s): "PROCALCITONIN", "LACTICIDVEN"  Microbiology: No results found for  this or any previous visit (from the past 240 hour(s)).  Radiology Studies: No results found.    Zigmond Trela T. Cambree Hendrix [facility]  If 7PM-7AM, please contact night-coverage [email or web address] 08/20/2022, 8:54 AM

## 2022-08-20 NOTE — TOC Progression Note (Addendum)
Transition of Care [facility]) - Progression Note    Patient Details  Name: Carla [name] MRN: [id number] Date of Birth: 02/24/1946  [facility] CM/SW Contact  Garnet Chatmon A [place], [place] Phone Number: 08/20/2022, 9:45 AM  Clinical Narrative:     CSW contacted Carla [name] at [facility], [phone].  CSW asked if there were updates regarding pt and family in potential admission.   He stated that he has been contacting pt's granddaughter Carla [name] and has left two voicemail's and that she has been unresponsive.   He said that he cannot pursue the admission process without getting the approval from pt's family.   CSW will make attempt to contact pt's granddaughter, Carla [name] "Carla [name]" and other pt's family members to assist with discharge plan.   TOC will continue to follow.   Expected Discharge Plan: Skilled Nursing Facility Barriers to Discharge: Continued Medical Work up  Expected Discharge Plan and Services   Discharge Planning Services: CM Consult Post Acute Care Choice: Long Term Acute Care (LTAC)                                         Social Determinants of Health (SDOH) Interventions SDOH Screenings   Food Insecurity: No Food Insecurity (07/28/2022)  Housing: Low Risk  (07/28/2022)  Transportation Needs: No Transportation Needs (07/28/2022)  Utilities: Not At Risk (07/28/2022)  Tobacco Use: High Risk (07/28/2022)    Readmission Risk Interventions    07/22/2022    1:43 PM  Readmission Risk Prevention Plan  Transportation Screening Complete  Palliative Care Screening Complete

## 2022-08-21 DIAGNOSIS — J9602 Acute respiratory failure with hypercapnia: Secondary | ICD-10-CM | POA: Diagnosis not present

## 2022-08-21 DIAGNOSIS — J9601 Acute respiratory failure with hypoxia: Secondary | ICD-10-CM | POA: Diagnosis not present

## 2022-08-21 NOTE — Progress Notes (Addendum)
   [facility]                                                                               Carla [name], is a 77 y.o. female, DOB - 02/25/1946, MRN:[id number] Admit date - 06/12/2022    Outpatient Primary MD for the patient is Patient, [name]  LOS - 70  days    Brief summary   77 yof w/ COPD/emphysema tobacco use,Hypertension, Dementia, Anxiety, Depression, GERD, Primary hyperparathyroidism, Vit D deficiency, HLD, Osteoporosis , found by family unresponsive and brought to ER on 06/12/22 and found to have acute on chronic hypercapnia on ABG>Intubated in ER and treated for COPD exacerbation. She had prolonged complicated hospitalization charted as below with multiple ICU admissions, needing tracheostomy placement, having encephalopathy with baseline dementia and pulling out her PICC line tracheostomy.   Significant Events  3/20 Admitted after being found unresponsive, intubated on ED arrival  CT angio chest >> atherosclerosis, severe coronary calcification, severe LVH, mild centrilobular emphysema, 2 mm nodule LUL, 4 mm nodule LUL Blood culture  >> Haemophilus influenzae 3/22 Had oxygen desaturation and low Vt with SBT this morning. Echo 06/14/22 >> EF 60 to 65%, mod LVH, grade 1 DD, mild/mod AR 3/25 bronch for peak pressures, low tidal volumes with mucus cast at end of ETT, thick secretions caking tube 3/29 Bronched again with purulent secretions LLL with plugging and LLL atelectasis, some increase in O2 requirement on vent, zosyn started for VAP coverage. MRSA nare swab today negative.  3/30 Remains intubated with waxing and weaning agitation on fentanyl and precedex  3/31 Remains on vent with continued issues with agitation when sedation lightened  4/2 family meeting, made DNR, plans for one way extubation 4/5 4/5-no overnight events, plan is for one-way extubation today 4/6-family changes mind regarding one-way extubation, plan will be for tracheostomy 4/8 trach  placed 4/10 failed overnight weaning trial, back on PRVC and sedation  was added. Subsequently sedation weaned and enteral agents were dc and or decreased.  4/11 failed psv w some distress. Fever. Abx added 4/12 cont efforts at psv. Most awake she has been in days   4/13 Tolerating PS.  4/15 Has been on ATC x 1:45 minutes, RR 40 and accessory muscle use, placing back on vent 4/18 Cortrak clogged and thus replaced. Still working on weaning completed CTx for PNA. 48 hrs on ATC  4/19 off vent x 72 hours so removed from room.  Planning on changing trach to cuffless.  Continuing to work with PMV.  Hypoglycemia adding D5 to maintenance fluids 4/20 transfer out of ICU  4/21 transfer to [facility].  4/26: Patient pulled tracheostomy tube.  4/28: Patient pulled PICC line, PIV inserted, Soft restraints. 4/30 : PCCM signed off-she is doing well without the tracheostomy   Assessment & Plan    Assessment and Plan:   Acute hypoxic/hypercapnic respiratory failure: Intubation and mechanical ventilation from 3/20-4/5. Tracheostomy on 4/6. Pulled out trach on 4/26.  Trach site healed.  Currently on room air. -Continue inhalers.   Acute metabolic encephalopathy in the background of dementia: based on previous reports it looks like at baseline patient has regular conversations with herself and wanders   out of the house.   Presented with unresponsiveness s/p extensive workup including  head CT negative for acute abnormality. Felt to be due to respiratory failure and also with ICU delirium.  Remains confused/disoriented.  Patient was seen by psychiatry and started on Seroquel.  Currently off benzos. -Dose of Seroquel increased to 75 mg qhs 5/15 -Reorientation and delirium precaution No changes in meds.    Dysphagia-advanced to dysphagia 3 diet. -Continue dysphagia 3 diet   Multiple falls in the hospital :found on the floor on 5/2  and 5/8.  No serious injury.  Negative imaging. -Fall precaution   Pulmonary  nodules: Incidental finding on CT 06/12/2022 - 2 mm and 4 mm noncalcified LUL lung nodules.   -May need outpatient surveillance depending on her prognosis and goals of care going forward.   Acute kidney injury: Resolved   Essential hypertension: well controlled BP parameters.  -continue Norvasc 5 mg daily with Coreg 3.125 mg twice a day   Chronic diastolic CHF: Currently euvolemic   Hypokalemia: Resolved   Physical deconditioning: PT and OT has not seen her.  SNF is recommended.  Difficult to place.  Social worker is following.  Family looking into assisted living facilities/memory care unit   Severe Malnutrition Body mass index is 15.97 kg/m. Nutrition Problem: Severe Malnutrition Etiology: chronic illness (COPD, dementia) Signs/Symptoms: severe fat depletion, severe muscle depletion Interventions: Ensure Enlive (each supplement provides 350kcal and 20 grams of protein), Juven     Code Status: dnr DVT Prophylaxis:  enoxaparin (LOVENOX) injection 30 mg Start: 07/02/22 1000   Level of Care: Level of care: Med-Surg Family Communication: none at bedside.   Disposition Plan:     pending.     Consultants:   PCCM. Antimicrobials:   Anti-infectives (From admission, onward)    Start     Dose/Rate Route Frequency Ordered Stop   07/31/22 1030  doxycycline (VIBRA-TABS) tablet 100 mg        100 mg Oral Every 12 hours 07/31/22 0932 08/05/22 0959   07/06/22 1000  cefTRIAXone (ROCEPHIN) 2 g in sodium chloride 0.9 % 100 mL IVPB        2 g 200 mL/hr over 30 Minutes Intravenous Every 24 hours 07/06/22 0848 07/08/22 0952   07/05/22 0200  piperacillin-tazobactam (ZOSYN) IVPB 3.375 g  Status:  Discontinued        3.375 g 12.5 mL/hr over 240 Minutes Intravenous Every 8 hours 07/04/22 1826 07/06/22 0848   07/04/22 1915  vancomycin (VANCOCIN) IVPB 1000 mg/200 mL premix  Status:  Discontinued        1,000 mg 200 mL/hr over 60 Minutes Intravenous Every 48 hours 07/04/22 1826 07/05/22 0827    07/04/22 1915  piperacillin-tazobactam (ZOSYN) IVPB 3.375 g        3.375 g 100 mL/hr over 30 Minutes Intravenous  Once 07/04/22 1826 07/04/22 1914   06/21/22 2300  piperacillin-tazobactam (ZOSYN) IVPB 3.375 g        3.375 g 12.5 mL/hr over 240 Minutes Intravenous Every 8 hours 06/21/22 1631 06/29/22 2009   06/21/22 1645  piperacillin-tazobactam (ZOSYN) IVPB 3.375 g  Status:  Discontinued        3.375 g 12.5 mL/hr over 240 Minutes Intravenous Every 8 hours 06/21/22 1630 06/21/22 1631   06/21/22 1645  piperacillin-tazobactam (ZOSYN) IVPB 3.375 g        3.375 g 100 mL/hr over 30 Minutes Intravenous STAT 06/21/22 1631 06/21/22 1900   06/14/22 1315  cefTRIAXone (ROCEPHIN) 2 g in sodium chloride   0.9 % 100 mL IVPB  Status:  Discontinued        2 g 200 mL/hr over 30 Minutes Intravenous Every 24 hours 06/14/22 1249 06/20/22 1415   06/12/22 2230  doxycycline (VIBRAMYCIN) 100 mg in sodium chloride 0.9 % 250 mL IVPB  Status:  Discontinued        100 mg 125 mL/hr over 120 Minutes Intravenous Every 12 hours 06/12/22 2139 06/14/22 1350        Medications  Scheduled Meds:  amLODipine  5 mg Oral Daily   carvedilol  6.25 mg Oral BID WC   enoxaparin (LOVENOX) injection  30 mg Subcutaneous Daily   feeding supplement  1 Container Oral BID BM   leptospermum manuka honey  1 Application Topical Daily   mometasone-formoterol  2 puff Inhalation BID   mupirocin cream   Topical BID   nicotine  21 mg Transdermal Daily   nutrition supplement (JUVEN)  1 packet Oral BID BM   mouth rinse  15 mL Mouth Rinse 4 times per day   pantoprazole  40 mg Oral Daily   QUEtiapine  25 mg Oral BID   QUEtiapine  75 mg Oral QHS   umeclidinium bromide  1 puff Inhalation Daily   Continuous Infusions: PRN Meds:.acetaminophen **OR** acetaminophen, albuterol, haloperidol lactate, lip balm, ondansetron **OR** ondansetron (ZOFRAN) IV, mouth rinse, polyethylene glycol, senna    Subjective:   Carla [name] was seen and  examined today.  No new complaints  Objective:   Vitals:   08/20/22 1517 08/20/22 1921 08/20/22 2030 08/21/22 1022  BP: (!) 140/67 138/74  (!) 148/42  Pulse: 90 86  67  Resp: 18 18    Temp: (!) 97.5 F (36.4 C) 97.9 F (36.6 C)  97.6 F (36.4 C)  TempSrc: Oral Oral  Oral  SpO2: 94% 93% 93% 97%  Weight:      Height:        Intake/Output Summary (Last 24 hours) at 08/21/2022 1422 Last data filed at 08/21/2022 0942 Gross per 24 hour  Intake 476 ml  Output --  Net 476 ml   Filed Weights   08/12/22 0500 08/16/22 0703 08/17/22 0541  Weight: 37 kg 37.3 kg 37.1 kg     Exam General: Alert and oriented x 3, NAD Cardiovascular: S1 S2 auscultated, no murmurs, RRR Respiratory: Clear to auscultation bilaterally, no wheezing, rales or rhonchi Gastrointestinal: Soft, nontender, nondistended, + bowel sounds Ext: no pedal edema bilaterally Neuro: AAOx3, Cr N's II- XII. Strength 5/5 upper and lower extremities bilaterally Skin: No rashes Psych: Normal affect and demeanor, alert and oriented x3    Data Reviewed:  I have personally reviewed following labs and imaging studies   CBC Lab Results  Component Value Date   WBC 7.6 08/19/2022   RBC 4.97 08/19/2022   HGB 15.0 08/19/2022   HCT 48.0 (H) 08/19/2022   MCV 96.6 08/19/2022   MCH 30.2 08/19/2022   PLT 337 08/19/2022   MCHC 31.3 08/19/2022   RDW 14.6 08/19/2022   LYMPHSABS 2.3 07/17/2022   MONOABS 0.7 07/17/2022   EOSABS 0.2 07/17/2022   BASOSABS 0.1 07/17/2022     Last metabolic panel Lab Results  Component Value Date   NA 136 08/19/2022   K 4.0 08/19/2022   CL 104 08/19/2022   CO2 24 08/19/2022   BUN 25 (H) 08/19/2022   CREATININE 0.80 08/19/2022   GLUCOSE 99 08/19/2022   GFRNONAA >60 08/19/2022   CALCIUM 9.5 08/19/2022   PHOS 3.5 08/19/2022     PROT 6.4 (L) 07/28/2022   ALBUMIN 2.9 (L) 08/19/2022   BILITOT 0.3 07/28/2022   ALKPHOS 93 07/28/2022   AST 20 07/28/2022   ALT 16 07/28/2022   ANIONGAP 8  08/19/2022    CBG (last 3)  No results for input(s): "GLUCAP" in the last 72 hours.    Coagulation Profile: No results for input(s): "INR", "PROTIME" in the last 168 hours.   Radiology Studies: No results found.     Prentiss Polio M.D. [facility] 08/21/2022, 2:22 PM  Available via Epic secure chat 7am-7pm After 7 pm, please refer to night coverage provider listed on amion.    

## 2022-08-22 DIAGNOSIS — J9602 Acute respiratory failure with hypercapnia: Secondary | ICD-10-CM | POA: Diagnosis not present

## 2022-08-22 DIAGNOSIS — J9601 Acute respiratory failure with hypoxia: Secondary | ICD-10-CM | POA: Diagnosis not present

## 2022-08-22 NOTE — Progress Notes (Addendum)
Physical Therapy Treatment Patient Details Name: Carla [name] MRN: [id number] DOB: [birth year] Today's Date: 08/22/2022   History of Present Illness 77 yo female admitted 3/20 unresponsive from home with acute respiratory failure with hypoxia and hypercapnia and metabolic encephalopathy. Intubated 3/20. trach 4/8. Self decannulated. PMhx: emphysema, tobacco abuse, PVD, HTN, prediabetes, anxiety, dementia    PT Comments    Pt restless today, states she wants to go home as she thinks she is in prison. Pt ambulated good hallway distance x2, requires rest breaks as needed, without AD and close guard for safety. Pt also tolerated LE exercises well with increased time. Pt remains limited by cognition given history of dementia. Pt progressing slowly, plan remains appropriate.     Recommendations for follow up therapy are one component of a multi-disciplinary discharge planning process, led by the attending physician.  Recommendations may be updated based on patient status, additional functional criteria and insurance authorization.  Follow Up Recommendations       Assistance Recommended at Discharge Frequent or constant Supervision/Assistance  Patient can return home with the following Direct supervision/assist for medications management;Assistance with cooking/housework;Assist for transportation;A little help with walking and/or transfers;A little help with bathing/dressing/bathroom   Equipment Recommendations  None recommended by PT    Recommendations for Other Services       Precautions / Restrictions Precautions Precautions: Fall Restrictions Weight Bearing Restrictions: No     Mobility  Bed Mobility Overal bed mobility: Needs Assistance             General bed mobility comments: up in chair    Transfers Overall transfer level: Needs assistance Equipment used: 1 person hand held assist Transfers: Sit to/from Stand Sit to Stand: Min guard           General  transfer comment: for safety, repeated sit<>stands as intervention requiring use of bilat UEs    Ambulation/Gait Ambulation/Gait assistance: Min guard Gait Distance (Feet): 150 Feet (x2 - seated and standing rest breaks) Assistive device: 1 person hand held assist, None Gait Pattern/deviations: Step-through pattern, Narrow base of support           Stairs             Wheelchair Mobility    Modified Rankin (Stroke Patients Only)       Balance Overall balance assessment: Needs assistance Sitting-balance support: Feet supported Sitting balance-Leahy Scale: Fair     Standing balance support: Single extremity supported Standing balance-Leahy Scale: Poor                              Cognition Arousal/Alertness: Awake/alert Behavior During Therapy: Restless Overall Cognitive Status: No family/caregiver present to determine baseline cognitive functioning Area of Impairment: Attention, Following commands, Problem solving, Orientation, Memory, Safety/judgement, Awareness                 Orientation Level: Disoriented to, Place, Time, Situation Current Attention Level: Sustained Memory: Decreased short-term memory, Decreased recall of precautions Following Commands: Follows one step commands consistently Safety/Judgement: Decreased awareness of safety, Decreased awareness of deficits Awareness: Intellectual Problem Solving: Slow processing, Requires verbal cues, Decreased initiation General Comments: pt states she is in prison, is restless verging on irritable stating "I want to go home, why can't I go home?".        Exercises Other Exercises Other Exercises: sit<>stands x10, heel raises with SL support x10    General Comments  Pertinent Vitals/Pain Pain Assessment Pain Assessment: Faces Faces Pain Scale: No hurt    Home Living                          Prior Function            PT Goals (current goals can now be  found in the care plan section) Acute Rehab PT Goals PT Goal Formulation: Patient unable to participate in goal setting Time For Goal Achievement: 09/02/22 Potential to Achieve Goals: Good Progress towards PT goals: Progressing toward goals    Frequency    Min 2X/week      PT Plan Current plan remains appropriate    Co-evaluation              AM-PAC PT "6 Clicks" Mobility   Outcome Measure  Help needed turning from your back to your side while in a flat bed without using bedrails?: A Little Help needed moving from lying on your back to sitting on the side of a flat bed without using bedrails?: A Little Help needed moving to and from a bed to a chair (including a wheelchair)?: A Little Help needed standing up from a chair using your arms (e.g., wheelchair or bedside chair)?: A Little Help needed to walk in hospital room?: A Little Help needed climbing 3-5 steps with a railing? : A Little 6 Click Score: 18    End of Session   Activity Tolerance: Patient limited by fatigue Patient left: in bed;with call bell/phone within reach;with nursing/sitter in room Nurse Communication: Mobility status PT Visit Diagnosis: Other abnormalities of gait and mobility (R26.89);Muscle weakness (generalized) (M62.81);Difficulty in walking, not elsewhere classified (R26.2)     Time: 1610-9604 PT Time Calculation (min) (ACUTE ONLY): 17 min  Charges:  $Therapeutic Activity: 8-22 mins                       [name], PT DPT Acute Rehabilitation Services Secure Chat Preferred  Office [phone]    [name] [name] 08/22/2022, 1:29 PM

## 2022-08-22 NOTE — Progress Notes (Addendum)
   [facility]                                                                               Carla [name], is a 77 y.o. female, DOB - 10/05/1945, MRN:[id number] Admit date - 06/12/2022    Outpatient Primary MD for the patient is Patient, [name]  LOS - 71  days    Brief summary   77 yof w/ COPD/emphysema tobacco use,Hypertension, Dementia, Anxiety, Depression, GERD, Primary hyperparathyroidism, Vit D deficiency, HLD, Osteoporosis , found by family unresponsive and brought to ER on 06/12/22 and found to have acute on chronic hypercapnia on ABG>Intubated in ER and treated for COPD exacerbation. She had prolonged complicated hospitalization charted as below with multiple ICU admissions, needing tracheostomy placement, having encephalopathy with baseline dementia and pulling out her PICC line tracheostomy.   Significant Events  3/20 Admitted after being found unresponsive, intubated on ED arrival  CT angio chest >> atherosclerosis, severe coronary calcification, severe LVH, mild centrilobular emphysema, 2 mm nodule LUL, 4 mm nodule LUL Blood culture  >> Haemophilus influenzae 3/22 Had oxygen desaturation and low Vt with SBT this morning. Echo 06/14/22 >> EF 60 to 65%, mod LVH, grade 1 DD, mild/mod AR 3/25 bronch for peak pressures, low tidal volumes with mucus cast at end of ETT, thick secretions caking tube 3/29 Bronched again with purulent secretions LLL with plugging and LLL atelectasis, some increase in O2 requirement on vent, zosyn started for VAP coverage. MRSA nare swab today negative.  3/30 Remains intubated with waxing and weaning agitation on fentanyl and precedex  3/31 Remains on vent with continued issues with agitation when sedation lightened  4/2 family meeting, made DNR, plans for one way extubation 4/5 4/5-no overnight events, plan is for one-way extubation today 4/6-family changes mind regarding one-way extubation, plan will be for tracheostomy 4/8 trach  placed 4/10 failed overnight weaning trial, back on PRVC and sedation  was added. Subsequently sedation weaned and enteral agents were dc and or decreased.  4/11 failed psv w some distress. Fever. Abx added 4/12 cont efforts at psv. Most awake she has been in days   4/13 Tolerating PS.  4/15 Has been on ATC x 1:45 minutes, RR 40 and accessory muscle use, placing back on vent 4/18 Cortrak clogged and thus replaced. Still working on weaning completed CTx for PNA. 48 hrs on ATC  4/19 off vent x 72 hours so removed from room.  Planning on changing trach to cuffless.  Continuing to work with PMV.  Hypoglycemia adding D5 to maintenance fluids 4/20 transfer out of ICU  4/21 transfer to [facility].  4/26: Patient pulled tracheostomy tube.  4/28: Patient pulled PICC line, PIV inserted, Soft restraints. 4/30 : PCCM signed off-she is doing well without the tracheostomy  No events overnight.     Assessment & Plan    Assessment and Plan:   Acute hypoxic/hypercapnic respiratory failure: Intubation and mechanical ventilation from 3/20-4/5. Tracheostomy on 4/6. Pulled out trach on 4/26.  Trach site healed.  Currently on room air. -Continue inhalers.   Acute metabolic encephalopathy in the background of dementia: based on previous reports it looks like at baseline patient has   regular conversations with herself and wanders out of the house.   Presented with unresponsiveness s/p extensive workup including  head CT negative for acute abnormality. Felt to be due to respiratory failure and also with ICU delirium.  Remains confused/disoriented.  Patient was seen by psychiatry and started on Seroquel.  Currently off benzos. -Dose of Seroquel increased to 75 mg qhs 5/15 -Reorientation and delirium precaution No changes in meds.    Dysphagia-advanced to dysphagia 3 diet. -Continue dysphagia 3 diet   Multiple falls in the hospital :found on the floor on 5/2  and 5/8.  No serious injury.  Negative imaging. -Fall  precaution   Pulmonary nodules: Incidental finding on CT 06/12/2022 - 2 mm and 4 mm noncalcified LUL lung nodules.   -May need outpatient surveillance depending on her prognosis and goals of care going forward.   Acute kidney injury: Resolved   Essential hypertension: well controlled BP parameters. No changes in meds.  -continue Norvasc 5 mg daily with Coreg 3.125 mg twice a day   Chronic diastolic CHF: Currently euvolemic   Hypokalemia: Resolved   Physical deconditioning: PT and OT has not seen her.  SNF is recommended.  Difficult to place.  Social worker is following.  Family looking into assisted living facilities/memory care unit. Unable to reach family .    Severe Malnutrition Body mass index is 15.97 kg/m. Nutrition Problem: Severe Malnutrition Etiology: chronic illness (COPD, dementia) Signs/Symptoms: severe fat depletion, severe muscle depletion Interventions: Ensure Enlive (each supplement provides 350kcal and 20 grams of protein), Juven     Code Status: DNR DVT Prophylaxis:  enoxaparin (LOVENOX) injection 30 mg Start: 07/02/22 1000   Level of Care: Level of care: Med-Surg Family Communication: none at bedside.   Disposition Plan:     pending.     Consultants:   PCCM. Antimicrobials:   Anti-infectives (From admission, onward)    Start     Dose/Rate Route Frequency Ordered Stop   07/31/22 1030  doxycycline (VIBRA-TABS) tablet 100 mg        100 mg Oral Every 12 hours 07/31/22 0932 08/05/22 0959   07/06/22 1000  cefTRIAXone (ROCEPHIN) 2 g in sodium chloride 0.9 % 100 mL IVPB        2 g 200 mL/hr over 30 Minutes Intravenous Every 24 hours 07/06/22 0848 07/08/22 0952   07/05/22 0200  piperacillin-tazobactam (ZOSYN) IVPB 3.375 g  Status:  Discontinued        3.375 g 12.5 mL/hr over 240 Minutes Intravenous Every 8 hours 07/04/22 1826 07/06/22 0848   07/04/22 1915  vancomycin (VANCOCIN) IVPB 1000 mg/200 mL premix  Status:  Discontinued        1,000 mg 200 mL/hr  over 60 Minutes Intravenous Every 48 hours 07/04/22 1826 07/05/22 0827   07/04/22 1915  piperacillin-tazobactam (ZOSYN) IVPB 3.375 g        3.375 g 100 mL/hr over 30 Minutes Intravenous  Once 07/04/22 1826 07/04/22 1914   06/21/22 2300  piperacillin-tazobactam (ZOSYN) IVPB 3.375 g        3.375 g 12.5 mL/hr over 240 Minutes Intravenous Every 8 hours 06/21/22 1631 06/29/22 2009   06/21/22 1645  piperacillin-tazobactam (ZOSYN) IVPB 3.375 g  Status:  Discontinued        3.375 g 12.5 mL/hr over 240 Minutes Intravenous Every 8 hours 06/21/22 1630 06/21/22 1631   06/21/22 1645  piperacillin-tazobactam (ZOSYN) IVPB 3.375 g        3.375 g 100 mL/hr over 30 Minutes Intravenous STAT   06/21/22 1631 06/21/22 1900   06/14/22 1315  cefTRIAXone (ROCEPHIN) 2 g in sodium chloride 0.9 % 100 mL IVPB  Status:  Discontinued        2 g 200 mL/hr over 30 Minutes Intravenous Every 24 hours 06/14/22 1249 06/20/22 1415   06/12/22 2230  doxycycline (VIBRAMYCIN) 100 mg in sodium chloride 0.9 % 250 mL IVPB  Status:  Discontinued        100 mg 125 mL/hr over 120 Minutes Intravenous Every 12 hours 06/12/22 2139 06/14/22 1350        Medications  Scheduled Meds:  amLODipine  5 mg Oral Daily   carvedilol  6.25 mg Oral BID WC   enoxaparin (LOVENOX) injection  30 mg Subcutaneous Daily   feeding supplement  1 Container Oral BID BM   leptospermum manuka honey  1 Application Topical Daily   mometasone-formoterol  2 puff Inhalation BID   mupirocin cream   Topical BID   nicotine  21 mg Transdermal Daily   nutrition supplement (JUVEN)  1 packet Oral BID BM   mouth rinse  15 mL Mouth Rinse 4 times per day   pantoprazole  40 mg Oral Daily   QUEtiapine  25 mg Oral BID   QUEtiapine  75 mg Oral QHS   umeclidinium bromide  1 puff Inhalation Daily   Continuous Infusions: PRN Meds:.acetaminophen **OR** acetaminophen, albuterol, haloperidol lactate, lip balm, ondansetron **OR** ondansetron (ZOFRAN) IV, mouth rinse,  polyethylene glycol, senna    Subjective:   Carla [name] was seen and examined today. nO NEW COMPLAINTS.   Objective:   Vitals:   08/21/22 2000 08/21/22 2038 08/22/22 0247 08/22/22 1101  BP: 134/69  126/67 118/67  Pulse: 78  81 85  Resp: 18   16  Temp: 98.3 F (36.8 C)  (!) 97.3 F (36.3 C) 97.8 F (36.6 C)  TempSrc: Oral     SpO2: 94% 92% 96% 96%  Weight:      Height:        Intake/Output Summary (Last 24 hours) at 08/22/2022 1303 Last data filed at 08/22/2022 0900 Gross per 24 hour  Intake 480 ml  Output --  Net 480 ml    Filed Weights   08/12/22 0500 08/16/22 0703 08/17/22 0541  Weight: 37 kg 37.3 kg 37.1 kg     Exam General exam: Appears calm and comfortable  Respiratory system: Clear to auscultation. Respiratory effort normal. Cardiovascular system: S1 & S2 heard, RRR. No JVD, Gastrointestinal system: Abdomen is nondistended, soft and nontender. Central nervous system: Alert and oriented to person only.  Extremities: Symmetric 5 x 5 power. Skin: No rashes, Psychiatry: calm.     Data Reviewed:  I have personally reviewed following labs and imaging studies   CBC Lab Results  Component Value Date   WBC 7.6 08/19/2022   RBC 4.97 08/19/2022   HGB 15.0 08/19/2022   HCT 48.0 (H) 08/19/2022   MCV 96.6 08/19/2022   MCH 30.2 08/19/2022   PLT 337 08/19/2022   MCHC 31.3 08/19/2022   RDW 14.6 08/19/2022   LYMPHSABS 2.3 07/17/2022   MONOABS 0.7 07/17/2022   EOSABS 0.2 07/17/2022   BASOSABS 0.1 07/17/2022     Last metabolic panel Lab Results  Component Value Date   NA 136 08/19/2022   K 4.0 08/19/2022   CL 104 08/19/2022   CO2 24 08/19/2022   BUN 25 (H) 08/19/2022   CREATININE 0.80 08/19/2022   GLUCOSE 99 08/19/2022   GFRNONAA >60 08/19/2022     CALCIUM 9.5 08/19/2022   PHOS 3.5 08/19/2022   PROT 6.4 (L) 07/28/2022   ALBUMIN 2.9 (L) 08/19/2022   BILITOT 0.3 07/28/2022   ALKPHOS 93 07/28/2022   AST 20 07/28/2022   ALT 16 07/28/2022    ANIONGAP 8 08/19/2022    CBG (last 3)  No results for input(s): "GLUCAP" in the last 72 hours.    Coagulation Profile: No results for input(s): "INR", "PROTIME" in the last 168 hours.   Radiology Studies: No results found.     Kimberly Coye M.D. [facility] 08/22/2022, 1:03 PM  Available via Epic secure chat 7am-7pm After 7 pm, please refer to night coverage provider listed on amion.    

## 2022-08-23 DIAGNOSIS — J9602 Acute respiratory failure with hypercapnia: Secondary | ICD-10-CM | POA: Diagnosis not present

## 2022-08-23 DIAGNOSIS — J9601 Acute respiratory failure with hypoxia: Secondary | ICD-10-CM | POA: Diagnosis not present

## 2022-08-23 NOTE — Progress Notes (Addendum)
   [facility]                                                                               Carla [name], is a 77 y.o. female, DOB - 07/12/1945, MRN:[id number] Admit date - 06/12/2022    Outpatient Primary MD for the patient is Patient, [name]  LOS - 72  days    Brief summary   77 yof w/ COPD/emphysema tobacco use,Hypertension, Dementia, Anxiety, Depression, GERD, Primary hyperparathyroidism, Vit D deficiency, HLD, Osteoporosis , found by family unresponsive and brought to ER on 06/12/22 and found to have acute on chronic hypercapnia on ABG>Intubated in ER and treated for COPD exacerbation. She had prolonged complicated hospitalization charted as below with multiple ICU admissions, needing tracheostomy placement, having encephalopathy with baseline dementia and pulling out her PICC line tracheostomy.   Significant Events  3/20 Admitted after being found unresponsive, intubated on ED arrival  CT angio chest >> atherosclerosis, severe coronary calcification, severe LVH, mild centrilobular emphysema, 2 mm nodule LUL, 4 mm nodule LUL Blood culture  >> Haemophilus influenzae 3/22 Had oxygen desaturation and low Vt with SBT this morning. Echo 06/14/22 >> EF 60 to 65%, mod LVH, grade 1 DD, mild/mod AR 3/25 bronch for peak pressures, low tidal volumes with mucus cast at end of ETT, thick secretions caking tube 3/29 Bronched again with purulent secretions LLL with plugging and LLL atelectasis, some increase in O2 requirement on vent, zosyn started for VAP coverage. MRSA nare swab today negative.  3/30 Remains intubated with waxing and weaning agitation on fentanyl and precedex  3/31 Remains on vent with continued issues with agitation when sedation lightened  4/2 family meeting, made DNR, plans for one way extubation 4/5 4/5-no overnight events, plan is for one-way extubation today 4/6-family changes mind regarding one-way extubation, plan will be for tracheostomy 4/8 trach  placed 4/10 failed overnight weaning trial, back on PRVC and sedation  was added. Subsequently sedation weaned and enteral agents were dc and or decreased.  4/11 failed psv w some distress. Fever. Abx added 4/12 cont efforts at psv. Most awake she has been in days   4/13 Tolerating PS.  4/15 Has been on ATC x 1:45 minutes, RR 40 and accessory muscle use, placing back on vent 4/18 Cortrak clogged and thus replaced. Still working on weaning completed CTx for PNA. 48 hrs on ATC  4/19 off vent x 72 hours so removed from room.  Planning on changing trach to cuffless.  Continuing to work with PMV.  Hypoglycemia adding D5 to maintenance fluids 4/20 transfer out of ICU  4/21 transfer to [facility].  4/26: Patient pulled tracheostomy tube.  4/28: Patient pulled PICC line, PIV inserted, Soft restraints. 4/30 : PCCM signed off-she is doing well without the tracheostomy No events overnight.     Assessment & Plan    Assessment and Plan:   Acute hypoxic/hypercapnic respiratory failure: Intubation and mechanical ventilation from 3/20-4/5. Tracheostomy on 4/6. Pulled out trach on 4/26.  Trach site healed.  Currently on room air. -Continue inhalers.   Acute metabolic encephalopathy in the background of dementia: based on previous reports it looks like at baseline patient has regular   conversations with herself and wanders out of the house.   Presented with unresponsiveness s/p extensive workup including  head CT negative for acute abnormality. Felt to be due to respiratory failure and also with ICU delirium.  Remains confused/disoriented.  Patient was seen by psychiatry and started on Seroquel.  Currently off benzos. -Dose of Seroquel increased to 75 mg qhs 5/15 -Reorientation and delirium precaution No changes in meds.    Dysphagia-advanced to dysphagia 3 diet. -Continue dysphagia 3 diet   Multiple falls in the hospital :found on the floor on 5/2  and 5/8.  No serious injury.  Negative imaging. -Fall  precaution   Pulmonary nodules: Incidental finding on CT 06/12/2022 - 2 mm and 4 mm noncalcified LUL lung nodules.   -May need outpatient surveillance depending on her prognosis and goals of care going forward.   Acute kidney injury: Resolved   Essential hypertension: not well controlled, added prn hydralazine.   -continue Norvasc 5 mg daily with Coreg 3.125 mg twice a day   Chronic diastolic CHF: Currently euvolemic   Hypokalemia: Resolved   Physical deconditioning: PT and OT has not seen her.  SNF is recommended.  Difficult to place.  Social worker is following.  Family looking into assisted living facilities/memory care unit. Unable to reach family .   Severe Protein calorie malnutrition: Nutrition supplementation.    Severe Malnutrition Body mass index is 15.97 kg/m. Nutrition Problem: Severe Malnutrition Etiology: chronic illness (COPD, dementia) Signs/Symptoms: severe fat depletion, severe muscle depletion Interventions: Ensure Enlive (each supplement provides 350kcal and 20 grams of protein), Juven     Code Status: DNR DVT Prophylaxis:  enoxaparin (LOVENOX) injection 30 mg Start: 07/02/22 1000   Level of Care: Level of care: Med-Surg Family Communication: none at bedside.   Disposition Plan:     pending.     Consultants:   PCCM. Antimicrobials:   Anti-infectives (From admission, onward)    Start     Dose/Rate Route Frequency Ordered Stop   07/31/22 1030  doxycycline (VIBRA-TABS) tablet 100 mg        100 mg Oral Every 12 hours 07/31/22 0932 08/05/22 0959   07/06/22 1000  cefTRIAXone (ROCEPHIN) 2 g in sodium chloride 0.9 % 100 mL IVPB        2 g 200 mL/hr over 30 Minutes Intravenous Every 24 hours 07/06/22 0848 07/08/22 0952   07/05/22 0200  piperacillin-tazobactam (ZOSYN) IVPB 3.375 g  Status:  Discontinued        3.375 g 12.5 mL/hr over 240 Minutes Intravenous Every 8 hours 07/04/22 1826 07/06/22 0848   07/04/22 1915  vancomycin (VANCOCIN) IVPB 1000 mg/200  mL premix  Status:  Discontinued        1,000 mg 200 mL/hr over 60 Minutes Intravenous Every 48 hours 07/04/22 1826 07/05/22 0827   07/04/22 1915  piperacillin-tazobactam (ZOSYN) IVPB 3.375 g        3.375 g 100 mL/hr over 30 Minutes Intravenous  Once 07/04/22 1826 07/04/22 1914   06/21/22 2300  piperacillin-tazobactam (ZOSYN) IVPB 3.375 g        3.375 g 12.5 mL/hr over 240 Minutes Intravenous Every 8 hours 06/21/22 1631 06/29/22 2009   06/21/22 1645  piperacillin-tazobactam (ZOSYN) IVPB 3.375 g  Status:  Discontinued        3.375 g 12.5 mL/hr over 240 Minutes Intravenous Every 8 hours 06/21/22 1630 06/21/22 1631   06/21/22 1645  piperacillin-tazobactam (ZOSYN) IVPB 3.375 g        3.375 g 100   mL/hr over 30 Minutes Intravenous STAT 06/21/22 1631 06/21/22 1900   06/14/22 1315  cefTRIAXone (ROCEPHIN) 2 g in sodium chloride 0.9 % 100 mL IVPB  Status:  Discontinued        2 g 200 mL/hr over 30 Minutes Intravenous Every 24 hours 06/14/22 1249 06/20/22 1415   06/12/22 2230  doxycycline (VIBRAMYCIN) 100 mg in sodium chloride 0.9 % 250 mL IVPB  Status:  Discontinued        100 mg 125 mL/hr over 120 Minutes Intravenous Every 12 hours 06/12/22 2139 06/14/22 1350        Medications  Scheduled Meds:  amLODipine  5 mg Oral Daily   carvedilol  6.25 mg Oral BID WC   enoxaparin (LOVENOX) injection  30 mg Subcutaneous Daily   feeding supplement  1 Container Oral BID BM   leptospermum manuka honey  1 Application Topical Daily   mometasone-formoterol  2 puff Inhalation BID   mupirocin cream   Topical BID   nicotine  21 mg Transdermal Daily   nutrition supplement (JUVEN)  1 packet Oral BID BM   mouth rinse  15 mL Mouth Rinse 4 times per day   pantoprazole  40 mg Oral Daily   QUEtiapine  25 mg Oral BID   QUEtiapine  75 mg Oral QHS   umeclidinium bromide  1 puff Inhalation Daily   Continuous Infusions: PRN Meds:.acetaminophen **OR** acetaminophen, albuterol, haloperidol lactate, lip balm,  ondansetron **OR** ondansetron (ZOFRAN) IV, mouth rinse, polyethylene glycol, senna    Subjective:   Carla [name] was seen and examined today. No new complaints.   Objective:   Vitals:   08/22/22 2019 08/22/22 2043 08/23/22 0611 08/23/22 1011  BP: (!) 146/77  121/89 (!) 162/84  Pulse: (!) 148 86 71 80  Resp: 18  18 18  Temp: 97.8 F (36.6 C)  98.1 F (36.7 C) (!) 97.5 F (36.4 C)  TempSrc: Oral   Oral  SpO2: 100%  93% 94%  Weight:      Height:        Intake/Output Summary (Last 24 hours) at 08/23/2022 1612 Last data filed at 08/23/2022 1000 Gross per 24 hour  Intake 410 ml  Output --  Net 410 ml    Filed Weights   08/12/22 0500 08/16/22 0703 08/17/22 0541  Weight: 37 kg 37.3 kg 37.1 kg     Exam General exam: Appears calm and comfortable  Respiratory system: Clear to auscultation. Respiratory effort normal. Cardiovascular system: S1 & S2 heard, RRR. No JVD,  Gastrointestinal system: Abdomen is nondistended, soft and nontender. Central nervous system: Alert and oriented to person only.  Extremities: NO pedal edema.  Skin: No rashes,  Psychiatry: mood is appropriate.      Data Reviewed:  I have personally reviewed following labs and imaging studies   CBC Lab Results  Component Value Date   WBC 7.6 08/19/2022   RBC 4.97 08/19/2022   HGB 15.0 08/19/2022   HCT 48.0 (H) 08/19/2022   MCV 96.6 08/19/2022   MCH 30.2 08/19/2022   PLT 337 08/19/2022   MCHC 31.3 08/19/2022   RDW 14.6 08/19/2022   LYMPHSABS 2.3 07/17/2022   MONOABS 0.7 07/17/2022   EOSABS 0.2 07/17/2022   BASOSABS 0.1 07/17/2022     Last metabolic panel Lab Results  Component Value Date   NA 136 08/19/2022   K 4.0 08/19/2022   CL 104 08/19/2022   CO2 24 08/19/2022   BUN 25 (H) 08/19/2022   CREATININE 0.80   08/19/2022   GLUCOSE 99 08/19/2022   GFRNONAA >60 08/19/2022   CALCIUM 9.5 08/19/2022   PHOS 3.5 08/19/2022   PROT 6.4 (L) 07/28/2022   ALBUMIN 2.9 (L) 08/19/2022   BILITOT  0.3 07/28/2022   ALKPHOS 93 07/28/2022   AST 20 07/28/2022   ALT 16 07/28/2022   ANIONGAP 8 08/19/2022    CBG (last 3)  No results for input(s): "GLUCAP" in the last 72 hours.    Coagulation Profile: No results for input(s): "INR", "PROTIME" in the last 168 hours.   Radiology Studies: No results found.     Maebry Obrien M.D. [facility] 08/23/2022, 4:12 PM  Available via Epic secure chat 7am-7pm After 7 pm, please refer to night coverage provider listed on amion.    

## 2022-08-23 NOTE — TOC Progression Note (Addendum)
Transition of Care [facility]) - Progression Note    Patient Details  Name: Carla [name] MRN: [id number] Date of Birth: 04-09-1945  Transition [facility] CM/SW Contact  Carlye Panameno A [place], [place] Phone Number: 08/23/2022, 4:40 PM  Clinical Narrative:     CSW contacted pt's husband, Kaaren Mead, who is currently admitted to [facility]. He said that he was transferred from [place]. He said that [name], "Angelica Chessman" granddaughter, is Health Care POA for his wife, Annitta. When asked if he could assist with disposition planning as well he stated "not at this moment."  Afterwards, CSW was able to reach pt's granddaughter, [name]. She stated that she has been unable to be reached due to her work schedule.   CSW provided contact information for a [employer], a community liaison who assists with finding appropriate level of care of pt and family members. She was agreeable to referral.   She stated that pt's husband, Gala Romney, did not want nursing care at their home. She said that pt needs assistance with medication and mobility around home and currently husband cannot provide support.   CSW said that [employer] needs follow up from family regarding interest and financials conversation.   CSW will follow up with financial services regarding Medicaid status option. SNF at [facility] follow up also needed to determine bed availability and insurance coverage.   TOC will continue to follow.   Expected Discharge Plan: Skilled Nursing Facility Barriers to Discharge: Continued Medical Work up  Expected Discharge Plan and Services   Discharge Planning Services: CM Consult Post Acute Care Choice: Long Term Acute Care (LTAC)                                         Social Determinants of Health (SDOH) Interventions SDOH Screenings   Food Insecurity: No Food Insecurity (07/28/2022)  Housing: Low Risk  (07/28/2022)  Transportation Needs: No Transportation Needs (07/28/2022)  Utilities: Not  At Risk (07/28/2022)  Tobacco Use: High Risk (07/28/2022)    Readmission Risk Interventions    07/22/2022    1:43 PM  Readmission Risk Prevention Plan  Transportation Screening Complete  Palliative Care Screening Complete

## 2022-08-24 DIAGNOSIS — J9601 Acute respiratory failure with hypoxia: Secondary | ICD-10-CM | POA: Diagnosis not present

## 2022-08-24 DIAGNOSIS — J9602 Acute respiratory failure with hypercapnia: Secondary | ICD-10-CM | POA: Diagnosis not present

## 2022-08-24 NOTE — Progress Notes (Addendum)
   [facility]                                                                               Maryalyce Papania, is a 77 y.o. female, DOB - 10/03/1945, MRN:[id number] Admit date - 06/12/2022    Outpatient Primary MD for the patient is Patient, [name]  LOS - 73  days    Brief summary   77 yof w/ COPD/emphysema tobacco use,Hypertension, Dementia, Anxiety, Depression, GERD, Primary hyperparathyroidism, Vit D deficiency, HLD, Osteoporosis , found by family unresponsive and brought to ER on 06/12/22 and found to have acute on chronic hypercapnia on ABG>Intubated in ER and treated for COPD exacerbation. She had prolonged complicated hospitalization charted as below with multiple ICU admissions, needing tracheostomy placement, having encephalopathy with baseline dementia and pulling out her PICC line tracheostomy.   Significant Events  3/20 Admitted after being found unresponsive, intubated on ED arrival  CT angio chest >> atherosclerosis, severe coronary calcification, severe LVH, mild centrilobular emphysema, 2 mm nodule LUL, 4 mm nodule LUL Blood culture  >> Haemophilus influenzae 3/22 Had oxygen desaturation and low Vt with SBT this morning. Echo 06/14/22 >> EF 60 to 65%, mod LVH, grade 1 DD, mild/mod AR 3/25 bronch for peak pressures, low tidal volumes with mucus cast at end of ETT, thick secretions caking tube 3/29 Bronched again with purulent secretions LLL with plugging and LLL atelectasis, some increase in O2 requirement on vent, zosyn started for VAP coverage. MRSA nare swab today negative.  3/30 Remains intubated with waxing and weaning agitation on fentanyl and precedex  3/31 Remains on vent with continued issues with agitation when sedation lightened  4/2 family meeting, made DNR, plans for one way extubation 4/5 4/5-no overnight events, plan is for one-way extubation today 4/6-family changes mind regarding one-way extubation, plan will be for tracheostomy 4/8 trach  placed 4/10 failed overnight weaning trial, back on PRVC and sedation  was added. Subsequently sedation weaned and enteral agents were dc and or decreased.  4/11 failed psv w some distress. Fever. Abx added 4/12 cont efforts at psv. Most awake she has been in days   4/13 Tolerating PS.  4/15 Has been on ATC x 1:45 minutes, RR 40 and accessory muscle use, placing back on vent 4/18 Cortrak clogged and thus replaced. Still working on weaning completed CTx for PNA. 48 hrs on ATC  4/19 off vent x 72 hours so removed from room.  Planning on changing trach to cuffless.  Continuing to work with PMV.  Hypoglycemia adding D5 to maintenance fluids 4/20 transfer out of ICU  4/21 transfer to [facility].  4/26: Patient pulled tracheostomy tube.  4/28: Patient pulled PICC line, PIV inserted, Soft restraints. 4/30 : PCCM signed off-she is doing well without the tracheostomy  No new complaints.     Assessment & Plan    Assessment and Plan:   Acute hypoxic/hypercapnic respiratory failure: Intubation and mechanical ventilation from 3/20-4/5. Tracheostomy on 4/6. Pulled out trach on 4/26.  Trach site healed.   She is on RA. No new complaints.    Acute metabolic encephalopathy in the background of dementia: based on previous reports it looks like at   baseline patient has regular conversations with herself and wanders out of the house.   Presented with unresponsiveness s/p extensive workup including  head CT negative for acute abnormality. Felt to be due to respiratory failure and also with ICU delirium.  Remains confused/disoriented.  Patient was seen by psychiatry and started on Seroquel.  Currently off benzos. -Dose of Seroquel increased to 75 mg qhs 5/15 -Reorientation and delirium precaution No changes in meds.    Dysphagia-advanced to dysphagia 3 diet. -Continue dysphagia 3 diet   Multiple falls in the hospital :found on the floor on 5/2  and 5/8.  No serious injury.  Negative imaging. -Fall precaution    Pulmonary nodules: Incidental finding on CT 06/12/2022 - 2 mm and 4 mm noncalcified LUL lung nodules.   -May need outpatient surveillance depending on her prognosis and goals of care going forward.   Acute kidney injury: Resolved   Essential hypertension: well controlled.  added prn hydralazine.   -continue Norvasc 5 mg daily with Coreg 3.125 mg twice a day   Chronic diastolic CHF: Currently euvolemic   Hypokalemia: Resolved   Physical deconditioning: PT and OT has not seen her.  SNF is recommended.  Difficult to place.  Social worker is following.  Family looking into assisted living facilities/memory care unit. Unable to reach family .   Severe Protein calorie malnutrition: Nutrition supplementation.    Severe Malnutrition Body mass index is 15.97 kg/m. Nutrition Problem: Severe Malnutrition Etiology: chronic illness (COPD, dementia) Signs/Symptoms: severe fat depletion, severe muscle depletion Interventions: Ensure Enlive (each supplement provides 350kcal and 20 grams of protein), Juven     Code Status: DNR DVT Prophylaxis:  enoxaparin (LOVENOX) injection 30 mg Start: 07/02/22 1000   Level of Care: Level of care: Med-Surg Family Communication: none at bedside.   Disposition Plan:     pending.     Consultants:   PCCM. Antimicrobials:   Anti-infectives (From admission, onward)    Start     Dose/Rate Route Frequency Ordered Stop   07/31/22 1030  doxycycline (VIBRA-TABS) tablet 100 mg        100 mg Oral Every 12 hours 07/31/22 0932 08/05/22 0959   07/06/22 1000  cefTRIAXone (ROCEPHIN) 2 g in sodium chloride 0.9 % 100 mL IVPB        2 g 200 mL/hr over 30 Minutes Intravenous Every 24 hours 07/06/22 0848 07/08/22 0952   07/05/22 0200  piperacillin-tazobactam (ZOSYN) IVPB 3.375 g  Status:  Discontinued        3.375 g 12.5 mL/hr over 240 Minutes Intravenous Every 8 hours 07/04/22 1826 07/06/22 0848   07/04/22 1915  vancomycin (VANCOCIN) IVPB 1000 mg/200 mL premix   Status:  Discontinued        1,000 mg 200 mL/hr over 60 Minutes Intravenous Every 48 hours 07/04/22 1826 07/05/22 0827   07/04/22 1915  piperacillin-tazobactam (ZOSYN) IVPB 3.375 g        3.375 g 100 mL/hr over 30 Minutes Intravenous  Once 07/04/22 1826 07/04/22 1914   06/21/22 2300  piperacillin-tazobactam (ZOSYN) IVPB 3.375 g        3.375 g 12.5 mL/hr over 240 Minutes Intravenous Every 8 hours 06/21/22 1631 06/29/22 2009   06/21/22 1645  piperacillin-tazobactam (ZOSYN) IVPB 3.375 g  Status:  Discontinued        3.375 g 12.5 mL/hr over 240 Minutes Intravenous Every 8 hours 06/21/22 1630 06/21/22 1631   06/21/22 1645  piperacillin-tazobactam (ZOSYN) IVPB 3.375 g          3.375 g 100 mL/hr over 30 Minutes Intravenous STAT 06/21/22 1631 06/21/22 1900   06/14/22 1315  cefTRIAXone (ROCEPHIN) 2 g in sodium chloride 0.9 % 100 mL IVPB  Status:  Discontinued        2 g 200 mL/hr over 30 Minutes Intravenous Every 24 hours 06/14/22 1249 06/20/22 1415   06/12/22 2230  doxycycline (VIBRAMYCIN) 100 mg in sodium chloride 0.9 % 250 mL IVPB  Status:  Discontinued        100 mg 125 mL/hr over 120 Minutes Intravenous Every 12 hours 06/12/22 2139 06/14/22 1350        Medications  Scheduled Meds:  amLODipine  5 mg Oral Daily   carvedilol  6.25 mg Oral BID WC   enoxaparin (LOVENOX) injection  30 mg Subcutaneous Daily   feeding supplement  1 Container Oral BID BM   leptospermum manuka honey  1 Application Topical Daily   mometasone-formoterol  2 puff Inhalation BID   mupirocin cream   Topical BID   nicotine  21 mg Transdermal Daily   nutrition supplement (JUVEN)  1 packet Oral BID BM   mouth rinse  15 mL Mouth Rinse 4 times per day   pantoprazole  40 mg Oral Daily   QUEtiapine  25 mg Oral BID   QUEtiapine  75 mg Oral QHS   umeclidinium bromide  1 puff Inhalation Daily   Continuous Infusions: PRN Meds:.acetaminophen **OR** acetaminophen, albuterol, haloperidol lactate, lip balm, ondansetron  **OR** ondansetron (ZOFRAN) IV, mouth rinse, polyethylene glycol, senna    Subjective:   Genesia Granderson was seen and examined today. No new complaints.   Objective:   Vitals:   08/23/22 1011 08/23/22 1630 08/23/22 2017 08/24/22 0717  BP: (!) 162/84 119/66 135/62 (!) 142/71  Pulse: 80 72 80 66  Resp: 18 18 18 16  Temp: (!) 97.5 F (36.4 C) (!) 97.5 F (36.4 C) 98.1 F (36.7 C) (!) 97.5 F (36.4 C)  TempSrc: Oral Oral Oral Oral  SpO2: 94% 93% 94% 94%  Weight:      Height:        Intake/Output Summary (Last 24 hours) at 08/24/2022 1308 Last data filed at 08/23/2022 2200 Gross per 24 hour  Intake 290 ml  Output --  Net 290 ml    Filed Weights   08/12/22 0500 08/16/22 0703 08/17/22 0541  Weight: 37 kg 37.3 kg 37.1 kg     Exam General exam: Appears calm and comfortable  Respiratory system: Clear to auscultation. Respiratory effort normal. Cardiovascular system: S1 & S2 heard, RRR.  Gastrointestinal system: Abdomen is nondistended, soft and nontender.  Central nervous system: Alert and oriented to person only.  Extremities: No edema.  Skin: No rashes, Psychiatry: Mood & affect appropriate.      Data Reviewed:  I have personally reviewed following labs and imaging studies   CBC Lab Results  Component Value Date   WBC 7.6 08/19/2022   RBC 4.97 08/19/2022   HGB 15.0 08/19/2022   HCT 48.0 (H) 08/19/2022   MCV 96.6 08/19/2022   MCH 30.2 08/19/2022   PLT 337 08/19/2022   MCHC 31.3 08/19/2022   RDW 14.6 08/19/2022   LYMPHSABS 2.3 07/17/2022   MONOABS 0.7 07/17/2022   EOSABS 0.2 07/17/2022   BASOSABS 0.1 07/17/2022     Last metabolic panel Lab Results  Component Value Date   NA 136 08/19/2022   K 4.0 08/19/2022   CL 104 08/19/2022   CO2 24 08/19/2022   BUN 25 (H)   08/19/2022   CREATININE 0.80 08/19/2022   GLUCOSE 99 08/19/2022   GFRNONAA >60 08/19/2022   CALCIUM 9.5 08/19/2022   PHOS 3.5 08/19/2022   PROT 6.4 (L) 07/28/2022   ALBUMIN 2.9 (L)  08/19/2022   BILITOT 0.3 07/28/2022   ALKPHOS 93 07/28/2022   AST 20 07/28/2022   ALT 16 07/28/2022   ANIONGAP 8 08/19/2022    CBG (last 3)  No results for input(s): "GLUCAP" in the last 72 hours.    Coagulation Profile: No results for input(s): "INR", "PROTIME" in the last 168 hours.   Radiology Studies: No results found.     Sarrah Fiorenza M.D. [facility] 08/24/2022, 1:08 PM  Available via Epic secure chat 7am-7pm After 7 pm, please refer to night coverage provider listed on amion.    

## 2022-08-25 DIAGNOSIS — J9602 Acute respiratory failure with hypercapnia: Secondary | ICD-10-CM | POA: Diagnosis not present

## 2022-08-25 DIAGNOSIS — J9601 Acute respiratory failure with hypoxia: Secondary | ICD-10-CM | POA: Diagnosis not present

## 2022-08-25 NOTE — Progress Notes (Addendum)
   [facility]                                                                               Carla [name], is a 77 y.o. female, DOB - 04/23/1945, MRN:[id number] Admit date - 06/12/2022    Outpatient Primary MD for the patient is Patient, [name]  LOS - 74  days    Brief summary   77 yof w/ COPD/emphysema tobacco use,Hypertension, Dementia, Anxiety, Depression, GERD, Primary hyperparathyroidism, Vit D deficiency, HLD, Osteoporosis , found by family unresponsive and brought to ER on 06/12/22 and found to have acute on chronic hypercapnia on ABG>Intubated in ER and treated for COPD exacerbation. She had prolonged complicated hospitalization charted as below with multiple ICU admissions, needing tracheostomy placement, having encephalopathy with baseline dementia and pulling out her PICC line tracheostomy.   Significant Events  3/20 Admitted after being found unresponsive, intubated on ED arrival  CT angio chest >> atherosclerosis, severe coronary calcification, severe LVH, mild centrilobular emphysema, 2 mm nodule LUL, 4 mm nodule LUL Blood culture  >> Haemophilus influenzae 3/22 Had oxygen desaturation and low Vt with SBT this morning. Echo 06/14/22 >> EF 60 to 65%, mod LVH, grade 1 DD, mild/mod AR 3/25 bronch for peak pressures, low tidal volumes with mucus cast at end of ETT, thick secretions caking tube 3/29 Bronched again with purulent secretions LLL with plugging and LLL atelectasis, some increase in O2 requirement on vent, zosyn started for VAP coverage. MRSA nare swab today negative.  3/30 Remains intubated with waxing and weaning agitation on fentanyl and precedex  3/31 Remains on vent with continued issues with agitation when sedation lightened  4/2 family meeting, made DNR, plans for one way extubation 4/5 4/5-no overnight events, plan is for one-way extubation today 4/6-family changes mind regarding one-way extubation, plan will be for tracheostomy 4/8 trach  placed 4/10 failed overnight weaning trial, back on PRVC and sedation  was added. Subsequently sedation weaned and enteral agents were dc and or decreased.  4/11 failed psv w some distress. Fever. Abx added 4/12 cont efforts at psv. Most awake she has been in days   4/13 Tolerating PS.  4/15 Has been on ATC x 1:45 minutes, RR 40 and accessory muscle use, placing back on vent 4/18 Cortrak clogged and thus replaced. Still working on weaning completed CTx for PNA. 48 hrs on ATC  4/19 off vent x 72 hours so removed from room.  Planning on changing trach to cuffless.  Continuing to work with PMV.  Hypoglycemia adding D5 to maintenance fluids 4/20 transfer out of ICU  4/21 transfer to [facility].  4/26: Patient pulled tracheostomy tube.  4/28: Patient pulled PICC line, PIV inserted, Soft restraints. 4/30 : PCCM signed off-she is doing well without the tracheostomy Pt refused meds last night.     Assessment & Plan    Assessment and Plan:   Acute hypoxic/hypercapnic respiratory failure: Intubation and mechanical ventilation from 3/20-4/5. Tracheostomy on 4/6. Pulled out trach on 4/26.  Trach site healed.   She is on RA. No new complaints.    Acute metabolic encephalopathy in the background of dementia: based on previous reports it looks like   at baseline patient has regular conversations with herself and wanders out of the house.   Presented with unresponsiveness s/p extensive workup including  head CT negative for acute abnormality. Felt to be due to respiratory failure and also with ICU delirium.  Remains confused/disoriented.  Patient was seen by psychiatry and started on Seroquel.  Currently off benzos. -Dose of Seroquel increased to 75 mg qhs 5/15 -Reorientation and delirium precaution No changes in meds.    Dysphagia-advanced to dysphagia 3 diet. -Continue dysphagia 3 diet   Multiple falls in the hospital :found on the floor on 5/2  and 5/8.  No serious injury.  Negative imaging. -Fall  precaution   Pulmonary nodules: Incidental finding on CT 06/12/2022 - 2 mm and 4 mm noncalcified LUL lung nodules.   -May need outpatient surveillance depending on her prognosis and goals of care going forward.   Acute kidney injury: Resolved   Essential hypertension: well controlled.  added prn hydralazine.   -continue Norvasc 5 mg daily with Coreg 3.125 mg twice a day   Chronic diastolic CHF: Currently euvolemic   Hypokalemia: Resolved   Physical deconditioning: PT and OT has not seen her.  SNF is recommended.  Difficult to place.  Social worker is following.  Family looking into assisted living facilities/memory care unit. Unable to reach family .   Severe Protein calorie malnutrition: Nutrition supplementation.    Severe Malnutrition Body mass index is 15.97 kg/m. Nutrition Problem: Severe Malnutrition Etiology: chronic illness (COPD, dementia) Signs/Symptoms: severe fat depletion, severe muscle depletion Interventions: Ensure Enlive (each supplement provides 350kcal and 20 grams of protein), Juven     Code Status: DNR DVT Prophylaxis:  enoxaparin (LOVENOX) injection 30 mg Start: 07/02/22 1000   Level of Care: Level of care: Med-Surg Family Communication: none at bedside.   Disposition Plan:     pending.     Consultants:   PCCM. Antimicrobials:   Anti-infectives (From admission, onward)    Start     Dose/Rate Route Frequency Ordered Stop   07/31/22 1030  doxycycline (VIBRA-TABS) tablet 100 mg        100 mg Oral Every 12 hours 07/31/22 0932 08/05/22 0959   07/06/22 1000  cefTRIAXone (ROCEPHIN) 2 g in sodium chloride 0.9 % 100 mL IVPB        2 g 200 mL/hr over 30 Minutes Intravenous Every 24 hours 07/06/22 0848 07/08/22 0952   07/05/22 0200  piperacillin-tazobactam (ZOSYN) IVPB 3.375 g  Status:  Discontinued        3.375 g 12.5 mL/hr over 240 Minutes Intravenous Every 8 hours 07/04/22 1826 07/06/22 0848   07/04/22 1915  vancomycin (VANCOCIN) IVPB 1000 mg/200  mL premix  Status:  Discontinued        1,000 mg 200 mL/hr over 60 Minutes Intravenous Every 48 hours 07/04/22 1826 07/05/22 0827   07/04/22 1915  piperacillin-tazobactam (ZOSYN) IVPB 3.375 g        3.375 g 100 mL/hr over 30 Minutes Intravenous  Once 07/04/22 1826 07/04/22 1914   06/21/22 2300  piperacillin-tazobactam (ZOSYN) IVPB 3.375 g        3.375 g 12.5 mL/hr over 240 Minutes Intravenous Every 8 hours 06/21/22 1631 06/29/22 2009   06/21/22 1645  piperacillin-tazobactam (ZOSYN) IVPB 3.375 g  Status:  Discontinued        3.375 g 12.5 mL/hr over 240 Minutes Intravenous Every 8 hours 06/21/22 1630 06/21/22 1631   06/21/22 1645  piperacillin-tazobactam (ZOSYN) IVPB 3.375 g          3.375 g 100 mL/hr over 30 Minutes Intravenous STAT 06/21/22 1631 06/21/22 1900   06/14/22 1315  cefTRIAXone (ROCEPHIN) 2 g in sodium chloride 0.9 % 100 mL IVPB  Status:  Discontinued        2 g 200 mL/hr over 30 Minutes Intravenous Every 24 hours 06/14/22 1249 06/20/22 1415   06/12/22 2230  doxycycline (VIBRAMYCIN) 100 mg in sodium chloride 0.9 % 250 mL IVPB  Status:  Discontinued        100 mg 125 mL/hr over 120 Minutes Intravenous Every 12 hours 06/12/22 2139 06/14/22 1350        Medications  Scheduled Meds:  amLODipine  5 mg Oral Daily   carvedilol  6.25 mg Oral BID WC   enoxaparin (LOVENOX) injection  30 mg Subcutaneous Daily   feeding supplement  1 Container Oral BID BM   leptospermum manuka honey  1 Application Topical Daily   mometasone-formoterol  2 puff Inhalation BID   mupirocin cream   Topical BID   nicotine  21 mg Transdermal Daily   nutrition supplement (JUVEN)  1 packet Oral BID BM   mouth rinse  15 mL Mouth Rinse 4 times per day   pantoprazole  40 mg Oral Daily   QUEtiapine  25 mg Oral BID   QUEtiapine  75 mg Oral QHS   umeclidinium bromide  1 puff Inhalation Daily   Continuous Infusions: PRN Meds:.acetaminophen **OR** acetaminophen, albuterol, haloperidol lactate, lip balm,  ondansetron **OR** ondansetron (ZOFRAN) IV, mouth rinse, polyethylene glycol, senna    Subjective:   Carla [name] was seen and examined today. No new complaints.   Objective:   Vitals:   08/24/22 0717 08/24/22 1621 08/24/22 2055 08/24/22 2148  BP: (!) 142/71 (!) 149/70  (!) 148/70  Pulse: 66 73  72  Resp: 16 18  19  Temp: (!) 97.5 F (36.4 C) (!) 97.4 F (36.3 C)  97.6 F (36.4 C)  TempSrc: Oral   Oral  SpO2: 94% 96% 96% 94%  Weight:      Height:        Intake/Output Summary (Last 24 hours) at 08/25/2022 1326 Last data filed at 08/24/2022 2149 Gross per 24 hour  Intake 360 ml  Output --  Net 360 ml    Filed Weights   08/12/22 0500 08/16/22 0703 08/17/22 0541  Weight: 37 kg 37.3 kg 37.1 kg     Exam General exam: Appears calm and comfortable  Respiratory system: Clear to auscultation. Respiratory effort normal. Cardiovascular system: S1 & S2 heard, RRR.  Gastrointestinal system: Abdomen is nondistended, soft and nontender.  Central nervous system: Alert and oriented to person and place.  Extremities: Symmetric 5 x 5 power. Skin: No rashes, lesions or ulcers Psychiatry:  Mood & affect appropriate.       Data Reviewed:  I have personally reviewed following labs and imaging studies   CBC Lab Results  Component Value Date   WBC 7.6 08/19/2022   RBC 4.97 08/19/2022   HGB 15.0 08/19/2022   HCT 48.0 (H) 08/19/2022   MCV 96.6 08/19/2022   MCH 30.2 08/19/2022   PLT 337 08/19/2022   MCHC 31.3 08/19/2022   RDW 14.6 08/19/2022   LYMPHSABS 2.3 07/17/2022   MONOABS 0.7 07/17/2022   EOSABS 0.2 07/17/2022   BASOSABS 0.1 07/17/2022     Last metabolic panel Lab Results  Component Value Date   NA 136 08/19/2022   K 4.0 08/19/2022   CL 104 08/19/2022   CO2 24 08/19/2022     BUN 25 (H) 08/19/2022   CREATININE 0.80 08/19/2022   GLUCOSE 99 08/19/2022   GFRNONAA >60 08/19/2022   CALCIUM 9.5 08/19/2022   PHOS 3.5 08/19/2022   PROT 6.4 (L) 07/28/2022   ALBUMIN  2.9 (L) 08/19/2022   BILITOT 0.3 07/28/2022   ALKPHOS 93 07/28/2022   AST 20 07/28/2022   ALT 16 07/28/2022   ANIONGAP 8 08/19/2022    CBG (last 3)  No results for input(s): "GLUCAP" in the last 72 hours.    Coagulation Profile: No results for input(s): "INR", "PROTIME" in the last 168 hours.   Radiology Studies: No results found.     Jahsiah Carpenter M.D. [facility] 08/25/2022, 1:26 PM  Available via Epic secure chat 7am-7pm After 7 pm, please refer to night coverage provider listed on amion.    

## 2022-08-26 DIAGNOSIS — J9602 Acute respiratory failure with hypercapnia: Secondary | ICD-10-CM | POA: Diagnosis not present

## 2022-08-26 DIAGNOSIS — J9601 Acute respiratory failure with hypoxia: Secondary | ICD-10-CM | POA: Diagnosis not present

## 2022-08-26 NOTE — TOC Progression Note (Addendum)
Transition of Care [facility]) - Progression Note    Patient Details  Name: Carla [name] MRN: [id number] Date of Birth: 1945-05-31  Transition of Care [facility]) CM/SW Contact  Hussein Macdougal A [place], [place] Phone Number: 08/26/2022, 3:50 PM  Clinical Narrative:    CSW contacted pt's granddaughter, [name]. There was no answer. CSW was unable to leave voicemail.   TOC will continue to follow.   CSW contacted pt's granddaughter ,[name]. CSW inquired about  Loraine Leriche, pt's son, to get a working phone number. There was no answer.   CSW will try again at another more opportune time.   TOC will continue to follow.   Expected Discharge Plan: Skilled Nursing Facility Barriers to Discharge: Continued Medical Work up  Expected Discharge Plan and Services   Discharge Planning Services: CM Consult Post Acute Care Choice: Long Term Acute Care (LTAC)                                         Social Determinants of Health (SDOH) Interventions SDOH Screenings   Food Insecurity: No Food Insecurity (07/28/2022)  Housing: Low Risk  (07/28/2022)  Transportation Needs: No Transportation Needs (07/28/2022)  Utilities: Not At Risk (07/28/2022)  Tobacco Use: High Risk (07/28/2022)    Readmission Risk Interventions    07/22/2022    1:43 PM  Readmission Risk Prevention Plan  Transportation Screening Complete  Palliative Care Screening Complete

## 2022-08-26 NOTE — Progress Notes (Addendum)
   [facility]                                                                               Carla [name], is a 77 y.o. female, DOB - 04/11/1945, MRN:[id number] Admit date - 06/12/2022    Outpatient Primary MD for the patient is Patient, [name]  LOS - 75  days    Brief summary   77 yof w/ COPD/emphysema tobacco use,Hypertension, Dementia, Anxiety, Depression, GERD, Primary hyperparathyroidism, Vit D deficiency, HLD, Osteoporosis , found by family unresponsive and brought to ER on 06/12/22 and found to have acute on chronic hypercapnia on ABG>Intubated in ER and treated for COPD exacerbation. She had prolonged complicated hospitalization charted as below with multiple ICU admissions, needing tracheostomy placement, having encephalopathy with baseline dementia and pulling out her PICC line tracheostomy.   Significant Events  3/20 Admitted after being found unresponsive, intubated on ED arrival  CT angio chest >> atherosclerosis, severe coronary calcification, severe LVH, mild centrilobular emphysema, 2 mm nodule LUL, 4 mm nodule LUL Blood culture  >> Haemophilus influenzae 3/22 Had oxygen desaturation and low Vt with SBT this morning. Echo 06/14/22 >> EF 60 to 65%, mod LVH, grade 1 DD, mild/mod AR 3/25 bronch for peak pressures, low tidal volumes with mucus cast at end of ETT, thick secretions caking tube 3/29 Bronched again with purulent secretions LLL with plugging and LLL atelectasis, some increase in O2 requirement on vent, zosyn started for VAP coverage. MRSA nare swab today negative.  3/30 Remains intubated with waxing and weaning agitation on fentanyl and precedex  3/31 Remains on vent with continued issues with agitation when sedation lightened  4/2 family meeting, made DNR, plans for one way extubation 4/5 4/5-no overnight events, plan is for one-way extubation today 4/6-family changes mind regarding one-way extubation, plan will be for tracheostomy 4/8 trach  placed 4/10 failed overnight weaning trial, back on PRVC and sedation  was added. Subsequently sedation weaned and enteral agents were dc and or decreased.  4/11 failed psv w some distress. Fever. Abx added 4/12 cont efforts at psv. Most awake she has been in days   4/13 Tolerating PS.  4/15 Has been on ATC x 1:45 minutes, RR 40 and accessory muscle use, placing back on vent 4/18 Cortrak clogged and thus replaced. Still working on weaning completed CTx for PNA. 48 hrs on ATC  4/19 off vent x 72 hours so removed from room.  Planning on changing trach to cuffless.  Continuing to work with PMV.  Hypoglycemia adding D5 to maintenance fluids 4/20 transfer out of ICU  4/21 transfer to [facility].  4/26: Patient pulled tracheostomy tube.  4/28: Patient pulled PICC line, PIV inserted, Soft restraints. 4/30 : PCCM signed off-she is doing well without the tracheostomy No new events.     Assessment & Plan    Assessment and Plan:   Acute hypoxic/hypercapnic respiratory failure: Intubation and mechanical ventilation from 3/20-4/5. Tracheostomy on 4/6. Pulled out trach on 4/26.  Trach site healed.   She is on RA. No new complaints.    Acute metabolic encephalopathy in the background of dementia: based on previous reports it looks like at baseline   patient has regular conversations with herself and wanders out of the house.   Presented with unresponsiveness s/p extensive workup including  head CT negative for acute abnormality. Felt to be due to respiratory failure and also with ICU delirium.  Remains confused/disoriented.  Patient was seen by psychiatry and started on Seroquel.  Currently off benzos. -Dose of Seroquel increased to 75 mg qhs 5/15 -Reorientation and delirium precaution No changes in meds.    Dysphagia-advanced to dysphagia 3 diet. -Continue dysphagia 3 diet   Multiple falls in the hospital :found on the floor on 5/2  and 5/8.  No serious injury.  Negative imaging. -Fall precaution    Pulmonary nodules: Incidental finding on CT 06/12/2022 - 2 mm and 4 mm noncalcified LUL lung nodules.   -May need outpatient surveillance depending on her prognosis and goals of care going forward.   Acute kidney injury: Resolved   Essential hypertension: well controlled.  added prn hydralazine.   -continue Norvasc 5 mg daily with Coreg 3.125 mg twice a day   Chronic diastolic CHF: Currently euvolemic   Hypokalemia: Resolved   Physical deconditioning: therapy evaluations.  TOC on  board.   Severe Protein calorie malnutrition: Nutrition supplementation.    Severe Malnutrition Body mass index is 15.97 kg/m. Nutrition Problem: Severe Malnutrition Etiology: chronic illness (COPD, dementia) Signs/Symptoms: severe fat depletion, severe muscle depletion Interventions: Ensure Enlive (each supplement provides 350kcal and 20 grams of protein), Juven     Code Status: DNR DVT Prophylaxis:  enoxaparin (LOVENOX) injection 30 mg Start: 07/02/22 1000   Level of Care: Level of care: Med-Surg Family Communication: none at bedside.   Disposition Plan:     pending.     Consultants:   PCCM. Antimicrobials:   Anti-infectives (From admission, onward)    Start     Dose/Rate Route Frequency Ordered Stop   07/31/22 1030  doxycycline (VIBRA-TABS) tablet 100 mg        100 mg Oral Every 12 hours 07/31/22 0932 08/05/22 0959   07/06/22 1000  cefTRIAXone (ROCEPHIN) 2 g in sodium chloride 0.9 % 100 mL IVPB        2 g 200 mL/hr over 30 Minutes Intravenous Every 24 hours 07/06/22 0848 07/08/22 0952   07/05/22 0200  piperacillin-tazobactam (ZOSYN) IVPB 3.375 g  Status:  Discontinued        3.375 g 12.5 mL/hr over 240 Minutes Intravenous Every 8 hours 07/04/22 1826 07/06/22 0848   07/04/22 1915  vancomycin (VANCOCIN) IVPB 1000 mg/200 mL premix  Status:  Discontinued        1,000 mg 200 mL/hr over 60 Minutes Intravenous Every 48 hours 07/04/22 1826 07/05/22 0827   07/04/22 1915   piperacillin-tazobactam (ZOSYN) IVPB 3.375 g        3.375 g 100 mL/hr over 30 Minutes Intravenous  Once 07/04/22 1826 07/04/22 1914   06/21/22 2300  piperacillin-tazobactam (ZOSYN) IVPB 3.375 g        3.375 g 12.5 mL/hr over 240 Minutes Intravenous Every 8 hours 06/21/22 1631 06/29/22 2009   06/21/22 1645  piperacillin-tazobactam (ZOSYN) IVPB 3.375 g  Status:  Discontinued        3.375 g 12.5 mL/hr over 240 Minutes Intravenous Every 8 hours 06/21/22 1630 06/21/22 1631   06/21/22 1645  piperacillin-tazobactam (ZOSYN) IVPB 3.375 g        3.375 g 100 mL/hr over 30 Minutes Intravenous STAT 06/21/22 1631 06/21/22 1900   06/14/22 1315  cefTRIAXone (ROCEPHIN) 2 g in sodium chloride 0.9 %   100 mL IVPB  Status:  Discontinued        2 g 200 mL/hr over 30 Minutes Intravenous Every 24 hours 06/14/22 1249 06/20/22 1415   06/12/22 2230  doxycycline (VIBRAMYCIN) 100 mg in sodium chloride 0.9 % 250 mL IVPB  Status:  Discontinued        100 mg 125 mL/hr over 120 Minutes Intravenous Every 12 hours 06/12/22 2139 06/14/22 1350        Medications  Scheduled Meds:  amLODipine  5 mg Oral Daily   carvedilol  6.25 mg Oral BID WC   enoxaparin (LOVENOX) injection  30 mg Subcutaneous Daily   feeding supplement  1 Container Oral BID BM   leptospermum manuka honey  1 Application Topical Daily   mometasone-formoterol  2 puff Inhalation BID   mupirocin cream   Topical BID   nicotine  21 mg Transdermal Daily   nutrition supplement (JUVEN)  1 packet Oral BID BM   mouth rinse  15 mL Mouth Rinse 4 times per day   pantoprazole  40 mg Oral Daily   QUEtiapine  25 mg Oral BID   QUEtiapine  75 mg Oral QHS   umeclidinium bromide  1 puff Inhalation Daily   Continuous Infusions: PRN Meds:.acetaminophen **OR** acetaminophen, albuterol, haloperidol lactate, lip balm, ondansetron **OR** ondansetron (ZOFRAN) IV, mouth rinse, polyethylene glycol, senna    Subjective:   Carla [name] was seen and examined today.  No  new complaints.   Objective:   Vitals:   08/24/22 2055 08/24/22 2148 08/25/22 1927 08/26/22 0447  BP:  (!) 148/70 134/72 (!) 151/71  Pulse:  72 83 74  Resp:  19 20 20  Temp:  97.6 F (36.4 C) 98.1 F (36.7 C) 97.8 F (36.6 C)  TempSrc:  Oral Oral Oral  SpO2: 96% 94% 94% 90%  Weight:    37.5 kg  Height:        Intake/Output Summary (Last 24 hours) at 08/26/2022 0900 Last data filed at 08/26/2022 0037 Gross per 24 hour  Intake 480 ml  Output --  Net 480 ml    Filed Weights   08/16/22 0703 08/17/22 0541 08/26/22 0447  Weight: 37.3 kg 37.1 kg 37.5 kg     Exam General exam: Appears calm and comfortable  Respiratory system: Clear to auscultation. Respiratory effort normal. Cardiovascular system: S1 & S2 heard, RRR.  Gastrointestinal system: Abdomen is nondistended, soft and nontender.  Central nervous system: Alert and oriented to person and place only.  Extremities: Symmetric 5 x 5 power. Skin: No rashes,  Psychiatry: flat affect      Data Reviewed:  I have personally reviewed following labs and imaging studies   CBC Lab Results  Component Value Date   WBC 7.6 08/19/2022   RBC 4.97 08/19/2022   HGB 15.0 08/19/2022   HCT 48.0 (H) 08/19/2022   MCV 96.6 08/19/2022   MCH 30.2 08/19/2022   PLT 337 08/19/2022   MCHC 31.3 08/19/2022   RDW 14.6 08/19/2022   LYMPHSABS 2.3 07/17/2022   MONOABS 0.7 07/17/2022   EOSABS 0.2 07/17/2022   BASOSABS 0.1 07/17/2022     Last metabolic panel Lab Results  Component Value Date   NA 136 08/19/2022   K 4.0 08/19/2022   CL 104 08/19/2022   CO2 24 08/19/2022   BUN 25 (H) 08/19/2022   CREATININE 0.80 08/19/2022   GLUCOSE 99 08/19/2022   GFRNONAA >60 08/19/2022   CALCIUM 9.5 08/19/2022   PHOS 3.5 08/19/2022     PROT 6.4 (L) 07/28/2022   ALBUMIN 2.9 (L) 08/19/2022   BILITOT 0.3 07/28/2022   ALKPHOS 93 07/28/2022   AST 20 07/28/2022   ALT 16 07/28/2022   ANIONGAP 8 08/19/2022    CBG (last 3)  No results for  input(s): "GLUCAP" in the last 72 hours.    Coagulation Profile: No results for input(s): "INR", "PROTIME" in the last 168 hours.   Radiology Studies: No results found.     Emitt Maglione M.D. [facility] 08/26/2022, 9:00 AM  Available via Epic secure chat 7am-7pm After 7 pm, please refer to night coverage provider listed on amion.    

## 2022-08-27 DIAGNOSIS — J9601 Acute respiratory failure with hypoxia: Secondary | ICD-10-CM | POA: Diagnosis not present

## 2022-08-27 DIAGNOSIS — I10 Essential (primary) hypertension: Secondary | ICD-10-CM | POA: Diagnosis not present

## 2022-08-27 DIAGNOSIS — J9602 Acute respiratory failure with hypercapnia: Secondary | ICD-10-CM | POA: Diagnosis not present

## 2022-08-27 DIAGNOSIS — F0390 Unspecified dementia without behavioral disturbance: Secondary | ICD-10-CM

## 2022-08-27 NOTE — Progress Notes (Addendum)
Physical Therapy Treatment Patient Details Name: Carla [name] MRN: [id number] DOB: March 27, 1945 Today's Date: 08/27/2022   History of Present Illness 77 yo female admitted 3/20 unresponsive from home with acute respiratory failure with hypoxia and hypercapnia and metabolic encephalopathy. Intubated 3/20. trach 4/8. Self decannulated. PMhx: emphysema, tobacco abuse, PVD, HTN, prediabetes, anxiety, dementia    PT Comments    Pt sleeping upon arrival to room, per NT has been sleeping on and off all day. Pt is difficult to wake and to motivate to participate, pt's sons present at start of session and motivating but pt is irritable with them at times. Pt ambulatory in hallway without AD and close guard for safety, pt demonstrating mild imbalance with directional changes but otherwise does well. PT to continue to follow, could d/c home with constant supervision.       Recommendations for follow up therapy are one component of a multi-disciplinary discharge planning process, led by the attending physician.  Recommendations may be updated based on patient status, additional functional criteria and insurance authorization.  Follow Up Recommendations       Assistance Recommended at Discharge Frequent or constant Supervision/Assistance  Patient can return home with the following Direct supervision/assist for medications management;Assistance with cooking/housework;Assist for transportation;A little help with walking and/or transfers;A little help with bathing/dressing/bathroom   Equipment Recommendations  None recommended by PT    Recommendations for Other Services       Precautions / Restrictions Precautions Precautions: Fall Restrictions Weight Bearing Restrictions: No     Mobility  Bed Mobility Overal bed mobility: Needs Assistance Bed Mobility: Supine to Sit, Sit to Supine   Sidelying to sit: Min assist Supine to sit: Supervision     General bed mobility comments: assist for  trunk elevation, pt's son present and assisting with trunk elevation    Transfers Overall transfer level: Needs assistance Equipment used: 1 person hand held assist Transfers: Sit to/from Stand Sit to Stand: Min guard           General transfer comment: for safety    Ambulation/Gait Ambulation/Gait assistance: Min guard Gait Distance (Feet): 125 Feet Assistive device: None Gait Pattern/deviations: Step-through pattern, Narrow base of support Gait velocity: decr     General Gait Details: periods of single limb support, mostly ambulatory without AD   Stairs             Wheelchair Mobility    Modified Rankin (Stroke Patients Only)       Balance Overall balance assessment: Needs assistance Sitting-balance support: Feet supported Sitting balance-Leahy Scale: Fair     Standing balance support: Single extremity supported Standing balance-Leahy Scale: Fair Standing balance comment: min unsteadiness and increased time for 180 deg turn                            Cognition Arousal/Alertness: Awake/alert, Lethargic Behavior During Therapy: Flat affect Overall Cognitive Status: No family/caregiver present to determine baseline cognitive functioning Area of Impairment: Attention, Following commands, Problem solving, Orientation, Memory, Safety/judgement, Awareness                 Orientation Level: Disoriented to, Place, Time, Situation Current Attention Level: Sustained Memory: Decreased short-term memory, Decreased recall of precautions Following Commands: Follows one step commands consistently Safety/Judgement: Decreased awareness of safety, Decreased awareness of deficits Awareness: Intellectual Problem Solving: Slow processing, Requires verbal cues, Decreased initiation General Comments: pt very sleepy, difficult to wake and motivate to participate  Exercises      General Comments        Pertinent Vitals/Pain Pain  Assessment Pain Assessment: Faces Faces Pain Scale: Hurts a little bit Pain Location: buttocks Pain Descriptors / Indicators: Sore, Discomfort Pain Intervention(s): Limited activity within patient's tolerance, Monitored during session, Repositioned    Home Living                          Prior Function            PT Goals (current goals can now be found in the care plan section) Acute Rehab PT Goals PT Goal Formulation: Patient unable to participate in goal setting Time For Goal Achievement: 09/02/22 Potential to Achieve Goals: Good Progress towards PT goals: Progressing toward goals    Frequency    Min 2X/week      PT Plan Current plan remains appropriate    Co-evaluation              AM-PAC PT "6 Clicks" Mobility   Outcome Measure  Help needed turning from your back to your side while in a flat bed without using bedrails?: A Little Help needed moving from lying on your back to sitting on the side of a flat bed without using bedrails?: A Little Help needed moving to and from a bed to a chair (including a wheelchair)?: A Little Help needed standing up from a chair using your arms (e.g., wheelchair or bedside chair)?: A Little Help needed to walk in hospital room?: A Little Help needed climbing 3-5 steps with a railing? : A Little 6 Click Score: 18    End of Session   Activity Tolerance: Patient limited by fatigue Patient left: in bed;with call bell/phone within reach;with nursing/sitter in room;with bed alarm set Nurse Communication: Mobility status PT Visit Diagnosis: Other abnormalities of gait and mobility (R26.89);Muscle weakness (generalized) (M62.81);Difficulty in walking, not elsewhere classified (R26.2)     Time: 0981-1914 PT Time Calculation (min) (ACUTE ONLY): 19 min  Charges:  $Therapeutic Activity: 8-22 mins                     .sigh    Antionette Luster E Stroup 08/27/2022, 4:18 PM

## 2022-08-27 NOTE — Progress Notes (Addendum)
   [facility]                                                                               Carla [name], is a 77 y.o. female, DOB - 12/17/1945, MRN:[id number] Admit date - 06/12/2022    Outpatient Primary MD for the patient is Patient, [name]  LOS - 76  days    Brief summary   77 yof w/ COPD/emphysema tobacco use,Hypertension, Dementia, Anxiety, Depression, GERD, Primary hyperparathyroidism, Vit D deficiency, HLD, Osteoporosis , found by family unresponsive and brought to ER on 06/12/22 and found to have acute on chronic hypercapnia on ABG>Intubated in ER and treated for COPD exacerbation. She had prolonged complicated hospitalization charted as below with multiple ICU admissions, needing tracheostomy placement, having encephalopathy with baseline dementia and pulling out her PICC line tracheostomy.   Significant Events  3/20 Admitted after being found unresponsive, intubated on ED arrival  CT angio chest >> atherosclerosis, severe coronary calcification, severe LVH, mild centrilobular emphysema, 2 mm nodule LUL, 4 mm nodule LUL Blood culture  >> Haemophilus influenzae 3/22 Had oxygen desaturation and low Vt with SBT this morning. Echo 06/14/22 >> EF 60 to 65%, mod LVH, grade 1 DD, mild/mod AR 3/25 bronch for peak pressures, low tidal volumes with mucus cast at end of ETT, thick secretions caking tube 3/29 Bronched again with purulent secretions LLL with plugging and LLL atelectasis, some increase in O2 requirement on vent, zosyn started for VAP coverage. MRSA nare swab today negative.  3/30 Remains intubated with waxing and weaning agitation on fentanyl and precedex  3/31 Remains on vent with continued issues with agitation when sedation lightened  4/2 family meeting, made DNR, plans for one way extubation 4/5 4/5-no overnight events, plan is for one-way extubation today 4/6-family changes mind regarding one-way extubation, plan will be for tracheostomy 4/8 trach  placed 4/10 failed overnight weaning trial, back on PRVC and sedation  was added. Subsequently sedation weaned and enteral agents were dc and or decreased.  4/11 failed psv w some distress. Fever. Abx added 4/12 cont efforts at psv. Most awake she has been in days   4/13 Tolerating PS.  4/15 Has been on ATC x 1:45 minutes, RR 40 and accessory muscle use, placing back on vent 4/18 Cortrak clogged and thus replaced. Still working on weaning completed CTx for PNA. 48 hrs on ATC  4/19 off vent x 72 hours so removed from room.  Planning on changing trach to cuffless.  Continuing to work with PMV.  Hypoglycemia adding D5 to maintenance fluids 4/20 transfer out of ICU  4/21 transfer to [facility].  4/26: Patient pulled tracheostomy tube.  4/28: Patient pulled PICC line, PIV inserted, Soft restraints. 4/30 : PCCM signed off-she is doing well without the tracheostomy 6/4 Plan for discharge home with support TOC on board and planning to make arrangement.      Assessment & Plan    Assessment and Plan:   Acute hypoxic/hypercapnic respiratory failure: Intubation and mechanical ventilation from 3/20-4/5. Tracheostomy on 4/6. Pulled out trach on 4/26.  Trach site healed.   She is on RA. No new complaints.    Acute metabolic encephalopathy in   the background of dementia: based on previous reports it looks like at baseline patient has regular conversations with herself and wanders out of the house.    Presented with unresponsiveness s/p extensive workup including  head CT negative for acute abnormality.  Felt to be due to respiratory failure and also with ICU delirium.   Remains confused/disoriented with periods of clarity.   Patient was seen by psychiatry and started on Seroquel.  Currently off benzos. -Dose of Seroquel increased to 75 mg at bedtime and Seroquel 25 mg BID.  -Reorientation and delirium precaution No changes in meds.    Dysphagia -Continue dysphagia 3 diet.    Multiple falls in the  hospital :  found on the floor on 5/2  and 5/8.  No serious injury.  Negative imaging. -Fall precaution   Pulmonary nodules: Incidental finding on CT 06/12/2022 - 2 mm and 4 mm noncalcified LUL lung nodules.   -May need outpatient surveillance depending on her prognosis and goals of care going forward.   Acute kidney injury: Resolved   Essential hypertension:  -Optimal BP parameters.   added prn hydralazine.   -continue Norvasc 5 mg daily with Coreg 6.25 mg twice a day   Chronic diastolic CHF: Currently euvolemic   Hypokalemia: Resolved   Physical deconditioning: therapy evaluations.  TOC on  board.   Severe Protein calorie malnutrition: Nutrition supplementation.    Severe Malnutrition Body mass index is 15.97 kg/m. Nutrition Problem: Severe Malnutrition Etiology: chronic illness (COPD, dementia) Signs/Symptoms: severe fat depletion, severe muscle depletion Interventions: Ensure Enlive (each supplement provides 350kcal and 20 grams of protein), Juven     Code Status: DNR DVT Prophylaxis:  enoxaparin (LOVENOX) injection 30 mg Start: 07/02/22 1000   Level of Care: Level of care: Med-Surg Family Communication: none at bedside.   Disposition Plan:  pending.     Consultants:   PCCM.   Antimicrobials:   Anti-infectives (From admission, onward)    Start     Dose/Rate Route Frequency Ordered Stop   07/31/22 1030  doxycycline (VIBRA-TABS) tablet 100 mg        100 mg Oral Every 12 hours 07/31/22 0932 08/05/22 0959   07/06/22 1000  cefTRIAXone (ROCEPHIN) 2 g in sodium chloride 0.9 % 100 mL IVPB        2 g 200 mL/hr over 30 Minutes Intravenous Every 24 hours 07/06/22 0848 07/08/22 0952   07/05/22 0200  piperacillin-tazobactam (ZOSYN) IVPB 3.375 g  Status:  Discontinued        3.375 g 12.5 mL/hr over 240 Minutes Intravenous Every 8 hours 07/04/22 1826 07/06/22 0848   07/04/22 1915  vancomycin (VANCOCIN) IVPB 1000 mg/200 mL premix  Status:  Discontinued        1,000  mg 200 mL/hr over 60 Minutes Intravenous Every 48 hours 07/04/22 1826 07/05/22 0827   07/04/22 1915  piperacillin-tazobactam (ZOSYN) IVPB 3.375 g        3.375 g 100 mL/hr over 30 Minutes Intravenous  Once 07/04/22 1826 07/04/22 1914   06/21/22 2300  piperacillin-tazobactam (ZOSYN) IVPB 3.375 g        3.375 g 12.5 mL/hr over 240 Minutes Intravenous Every 8 hours 06/21/22 1631 06/29/22 2009   06/21/22 1645  piperacillin-tazobactam (ZOSYN) IVPB 3.375 g  Status:  Discontinued        3.375 g 12.5 mL/hr over 240 Minutes Intravenous Every 8 hours 06/21/22 1630 06/21/22 1631   06/21/22 1645  piperacillin-tazobactam (ZOSYN) IVPB 3.375 g          3.375 g 100 mL/hr over 30 Minutes Intravenous STAT 06/21/22 1631 06/21/22 1900   06/14/22 1315  cefTRIAXone (ROCEPHIN) 2 g in sodium chloride 0.9 % 100 mL IVPB  Status:  Discontinued        2 g 200 mL/hr over 30 Minutes Intravenous Every 24 hours 06/14/22 1249 06/20/22 1415   06/12/22 2230  doxycycline (VIBRAMYCIN) 100 mg in sodium chloride 0.9 % 250 mL IVPB  Status:  Discontinued        100 mg 125 mL/hr over 120 Minutes Intravenous Every 12 hours 06/12/22 2139 06/14/22 1350        Medications  Scheduled Meds:  amLODipine  5 mg Oral Daily   carvedilol  6.25 mg Oral BID WC   enoxaparin (LOVENOX) injection  30 mg Subcutaneous Daily   feeding supplement  1 Container Oral BID BM   leptospermum manuka honey  1 Application Topical Daily   mometasone-formoterol  2 puff Inhalation BID   mupirocin cream   Topical BID   nicotine  21 mg Transdermal Daily   nutrition supplement (JUVEN)  1 packet Oral BID BM   mouth rinse  15 mL Mouth Rinse 4 times per day   pantoprazole  40 mg Oral Daily   QUEtiapine  25 mg Oral BID   QUEtiapine  75 mg Oral QHS   umeclidinium bromide  1 puff Inhalation Daily   Continuous Infusions: PRN Meds:.acetaminophen **OR** acetaminophen, albuterol, haloperidol lactate, lip balm, ondansetron **OR** ondansetron (ZOFRAN) IV, mouth  rinse, polyethylene glycol, senna    Subjective:   Arrayah Mckissic was seen and examined today.  No new complaints.   Objective:   Vitals:   08/26/22 1656 08/26/22 2019 08/27/22 0916 08/27/22 0916  BP: 127/85 122/86  126/75  Pulse: 83 79  78  Resp: 19 20  17  Temp: (!) 97.4 F (36.3 C) 98 F (36.7 C) 97.6 F (36.4 C) 97.6 F (36.4 C)  TempSrc: Oral Oral Axillary Oral  SpO2: 93% 96%  93%  Weight:      Height:        Intake/Output Summary (Last 24 hours) at 08/27/2022 1524 Last data filed at 08/27/2022 1357 Gross per 24 hour  Intake 480 ml  Output --  Net 480 ml    Filed Weights   08/16/22 0703 08/17/22 0541 08/26/22 0447  Weight: 37.3 kg 37.1 kg 37.5 kg     Exam General exam: Appears calm and comfortable  Respiratory system: Clear to auscultation. Respiratory effort normal. Cardiovascular system: S1 & S2 heard, RRR. No JVD, murmurs, Gastrointestinal system: Abdomen is nondistended, soft and nontender.  Central nervous system: Alert and oriented to person and place.  Extremities: Symmetric 5 x 5 power. Skin: No rashes,  Psychiatry: Mood & affect appropriate.        Data Reviewed:  I have personally reviewed following labs and imaging studies   CBC Lab Results  Component Value Date   WBC 7.6 08/19/2022   RBC 4.97 08/19/2022   HGB 15.0 08/19/2022   HCT 48.0 (H) 08/19/2022   MCV 96.6 08/19/2022   MCH 30.2 08/19/2022   PLT 337 08/19/2022   MCHC 31.3 08/19/2022   RDW 14.6 08/19/2022   LYMPHSABS 2.3 07/17/2022   MONOABS 0.7 07/17/2022   EOSABS 0.2 07/17/2022   BASOSABS 0.1 07/17/2022     Last metabolic panel Lab Results  Component Value Date   NA 136 08/19/2022   K 4.0 08/19/2022   CL 104 08/19/2022   CO2 24 08/19/2022     BUN 25 (H) 08/19/2022   CREATININE 0.80 08/19/2022   GLUCOSE 99 08/19/2022   GFRNONAA >60 08/19/2022   CALCIUM 9.5 08/19/2022   PHOS 3.5 08/19/2022   PROT 6.4 (L) 07/28/2022   ALBUMIN 2.9 (L) 08/19/2022   BILITOT 0.3  07/28/2022   ALKPHOS 93 07/28/2022   AST 20 07/28/2022   ALT 16 07/28/2022   ANIONGAP 8 08/19/2022    CBG (last 3)  No results for input(s): "GLUCAP" in the last 72 hours.    Coagulation Profile: No results for input(s): "INR", "PROTIME" in the last 168 hours.   Radiology Studies: No results found.     Matia Zelada M.D. [facility] 08/27/2022, 3:24 PM  Available via Epic secure chat 7am-7pm After 7 pm, please refer to night coverage provider listed on amion.    

## 2022-08-28 DIAGNOSIS — J9601 Acute respiratory failure with hypoxia: Secondary | ICD-10-CM | POA: Diagnosis not present

## 2022-08-28 DIAGNOSIS — J9602 Acute respiratory failure with hypercapnia: Secondary | ICD-10-CM | POA: Diagnosis not present

## 2022-08-28 NOTE — Progress Notes (Addendum)
PROGRESS NOTE    Carla [name]  RUE:[id number] DOB: 03-10-1946 DOA: 06/12/2022 PCP: Patient, [name]   Brief Narrative:  66 yof w/ COPD/emphysema tobacco use,Hypertension, Dementia, Anxiety, Depression, GERD, Primary hyperparathyroidism, Vit D deficiency, HLD, Osteoporosis , found by family unresponsive and brought to ER on 06/12/22 and found to have acute on chronic hypercapnia on ABG>Intubated in ER and treated for COPD exacerbation. She had prolonged complicated hospitalization charted as below with multiple ICU admissions, needing tracheostomy placement, having encephalopathy with baseline dementia and pulling out her PICC line tracheostomy.   Significant Events  3/20 Admitted after being found unresponsive, intubated on ED arrival  CT angio chest >> atherosclerosis, severe coronary calcification, severe LVH, mild centrilobular emphysema, 2 mm nodule LUL, 4 mm nodule LUL Blood culture  >> Haemophilus influenzae 3/22 Had oxygen desaturation and low Vt with SBT this morning. Echo 06/14/22 >> EF 60 to 65%, mod LVH, grade 1 DD, mild/mod AR 3/25 bronch for peak pressures, low tidal volumes with mucus cast at end of ETT, thick secretions caking tube 3/29 Bronched again with purulent secretions LLL with plugging and LLL atelectasis, some increase in O2 requirement on vent, zosyn started for VAP coverage. MRSA nare swab today negative.  3/30 Remains intubated with waxing and weaning agitation on fentanyl and precedex  3/31 Remains on vent with continued issues with agitation when sedation lightened  4/2 family meeting, made DNR, plans for one way extubation 4/5 4/5-no overnight events, plan is for one-way extubation today 4/6-family changes mind regarding one-way extubation, plan will be for tracheostomy 4/8 trach placed 4/10 failed overnight weaning trial, back on PRVC and sedation  was added. Subsequently sedation weaned and enteral agents were dc and or decreased.  4/11 failed psv w some  distress. Fever. Abx added 4/12 cont efforts at psv. Most awake she has been in days   4/13 Tolerating PS.  4/15 Has been on ATC x 1:45 minutes, RR 40 and accessory muscle use, placing back on vent 4/18 Cortrak clogged and thus replaced. Still working on weaning completed CTx for PNA. 48 hrs on ATC  4/19 off vent x 72 hours so removed from room.  Planning on changing trach to cuffless.  Continuing to work with PMV.  Hypoglycemia adding D5 to maintenance fluids 4/20 transfer out of ICU  4/21 transfer to [facility].  4/26: Patient pulled tracheostomy tube.  4/28: Patient pulled PICC line, PIV inserted, Soft restraints. 4/30 : PCCM signed off-she is doing well without the tracheostomy 6/4 Plan for discharge home with support TOC on board and planning to make arrangement.         Assessment & Plan:   Principal Problem:   Acute respiratory failure with hypoxia and hypercapnia (HCC) Active Problems:   COPD with exacerbation (HCC)   Hyperglycemia   Acute metabolic encephalopathy   HTN (hypertension)   Tobacco abuse   Anxiety   Peripheral vascular disease (HCC)   Pulmonary nodules   Dementia (HCC)   Protein-calorie malnutrition, severe (HCC)   Urinary retention   Hypervolemia   Encephalopathy acute   Tracheostomy in place [facility])   AKI (acute kidney injury) (HCC)   Hypernatremia   Fever   Acute respiratory failure with hypercapnia (HCC)   Hyponatremia   Pressure injury of skin  Acute hypoxic/hypercapnic respiratory failure: Intubation and mechanical ventilation from 3/20-4/5. Tracheostomy on 4/6. Pulled out trach on 4/26.  Trach site healed. She is on RA. No new complaints.    Acute metabolic encephalopathy  in the background of dementia: based on previous reports it looks like at baseline patient has regular conversations with herself and wanders out of the house.   Presented with unresponsiveness s/p extensive workup including  head CT negative for acute abnormality.  Felt to be due to  respiratory failure and also with ICU delirium.   Remains confused/disoriented with periods of clarity.   Patient was seen by psychiatry and started on Seroquel.  Currently off benzos. -Dose of Seroquel increased to 75 mg at bedtime and Seroquel 25 mg BID.  -Reorientation and delirium precaution No changes in meds.    Dysphagia -Continue dysphagia 3 diet.    Multiple falls in the hospital :  found on the floor on 5/2  and 5/8.  No serious injury.  Negative imaging. -Fall precaution   Pulmonary nodules: Incidental finding on CT 06/12/2022 - 2 mm and 4 mm noncalcified LUL lung nodules.   -May need outpatient surveillance depending on her prognosis and goals of care going forward.   Acute kidney injury: Resolved   Essential hypertension:  -Optimal BP parameters.   added prn hydralazine.   -continue Norvasc 5 mg daily with Coreg 6.25 mg twice a day   Chronic diastolic CHF: Currently euvolemic   Hypokalemia: Resolved   Physical deconditioning: therapy evaluations.  TOC on  board.    Severe protein calorie malnutrition Body mass index is 15.97 kg/m. Nutrition Problem: Severe Malnutrition Etiology: chronic illness (COPD, dementia) Signs/Symptoms: severe fat depletion, severe muscle depletion Interventions: Ensure Enlive (each supplement provides 350kcal and 20 grams of protein), Juven   DVT prophylaxis: enoxaparin (LOVENOX) injection 30 mg Start: 07/02/22 1000   Code Status: DNR  Family Communication:  None present at bedside.   Status is: Inpatient Remains inpatient appropriate because: Pending placement/safe discharge, TOC on board   Estimated body mass index is 16.15 kg/m as calculated from the following:   Height as of this encounter: 5' (1.524 m).   Weight as of this encounter: 37.5 kg.    Nutritional Assessment: Body mass index is 16.15 kg/m.Marland Kitchen Seen by dietician.  I agree with the assessment and plan as outlined below: Nutrition Status: Nutrition Problem:  Severe Malnutrition Etiology: chronic illness (COPD, dementia) Signs/Symptoms: severe fat depletion, severe muscle depletion Interventions: Ensure Enlive (each supplement provides 350kcal and 20 grams of protein), Juven  . Skin Assessment: I have examined the patient's skin and I agree with the wound assessment as performed by the wound care RN as outlined below:    Consultants:  Psychiatry  Procedures:  As above  Antimicrobials:  Anti-infectives (From admission, onward)    Start     Dose/Rate Route Frequency Ordered Stop   07/31/22 1030  doxycycline (VIBRA-TABS) tablet 100 mg        100 mg Oral Every 12 hours 07/31/22 0932 08/05/22 0959   07/06/22 1000  cefTRIAXone (ROCEPHIN) 2 g in sodium chloride 0.9 % 100 mL IVPB        2 g 200 mL/hr over 30 Minutes Intravenous Every 24 hours 07/06/22 0848 07/08/22 0952   07/05/22 0200  piperacillin-tazobactam (ZOSYN) IVPB 3.375 g  Status:  Discontinued        3.375 g 12.5 mL/hr over 240 Minutes Intravenous Every 8 hours 07/04/22 1826 07/06/22 0848   07/04/22 1915  vancomycin (VANCOCIN) IVPB 1000 mg/200 mL premix  Status:  Discontinued        1,000 mg 200 mL/hr over 60 Minutes Intravenous Every 48 hours 07/04/22 1826 07/05/22 0827  07/04/22 1915  piperacillin-tazobactam (ZOSYN) IVPB 3.375 g        3.375 g 100 mL/hr over 30 Minutes Intravenous  Once 07/04/22 1826 07/04/22 1914   06/21/22 2300  piperacillin-tazobactam (ZOSYN) IVPB 3.375 g        3.375 g 12.5 mL/hr over 240 Minutes Intravenous Every 8 hours 06/21/22 1631 06/29/22 2009   06/21/22 1645  piperacillin-tazobactam (ZOSYN) IVPB 3.375 g  Status:  Discontinued        3.375 g 12.5 mL/hr over 240 Minutes Intravenous Every 8 hours 06/21/22 1630 06/21/22 1631   06/21/22 1645  piperacillin-tazobactam (ZOSYN) IVPB 3.375 g        3.375 g 100 mL/hr over 30 Minutes Intravenous STAT 06/21/22 1631 06/21/22 1900   06/14/22 1315  cefTRIAXone (ROCEPHIN) 2 g in sodium chloride 0.9 % 100 mL IVPB   Status:  Discontinued        2 g 200 mL/hr over 30 Minutes Intravenous Every 24 hours 06/14/22 1249 06/20/22 1415   06/12/22 2230  doxycycline (VIBRAMYCIN) 100 mg in sodium chloride 0.9 % 250 mL IVPB  Status:  Discontinued        100 mg 125 mL/hr over 120 Minutes Intravenous Every 12 hours 06/12/22 2139 06/14/22 1350         Subjective: Patient seen and examined.  She is alert and oriented to self.  Unable to tell me anything else other than her birthday.  Objective: Vitals:   08/27/22 1622 08/28/22 0613 08/28/22 0819 08/28/22 0916  BP: 125/73 129/71  124/68  Pulse: 89 74  80  Resp: 18 17  18   Temp: 97.9 F (36.6 C) 98.2 F (36.8 C)  98 F (36.7 C)  TempSrc: Oral   Oral  SpO2: 95% 91% 90% 92%  Weight:      Height:        Intake/Output Summary (Last 24 hours) at 08/28/2022 1001 Last data filed at 08/28/2022 0854 Gross per 24 hour  Intake 700 ml  Output --  Net 700 ml   Filed Weights   08/16/22 0703 08/17/22 0541 08/26/22 0447  Weight: 37.3 kg 37.1 kg 37.5 kg    Examination:  General exam: Appears calm and comfortable  Respiratory system: Clear to auscultation. Respiratory effort normal. Cardiovascular system: S1 & S2 heard, RRR. No JVD, murmurs, rubs, gallops or clicks. No pedal edema. Gastrointestinal system: Abdomen is nondistended, soft and nontender. No organomegaly or masses felt. Normal bowel sounds heard. Central nervous system: Alert but oriented x 1.  No focal deficit.  Data Reviewed: I have personally reviewed following labs and imaging studies  CBC: No results for input(s): "WBC", "NEUTROABS", "HGB", "HCT", "MCV", "PLT" in the last 168 hours. Basic Metabolic Panel: No results for input(s): "NA", "K", "CL", "CO2", "GLUCOSE", "BUN", "CREATININE", "CALCIUM", "MG", "PHOS" in the last 168 hours. GFR: Estimated Creatinine Clearance: 34.9 mL/min (by C-G formula based on SCr of 0.8 mg/dL). Liver Function Tests: No results for input(s): "AST", "ALT", "ALKPHOS",  "BILITOT", "PROT", "ALBUMIN" in the last 168 hours. No results for input(s): "LIPASE", "AMYLASE" in the last 168 hours. No results for input(s): "AMMONIA" in the last 168 hours. Coagulation Profile: No results for input(s): "INR", "PROTIME" in the last 168 hours. Cardiac Enzymes: No results for input(s): "CKTOTAL", "CKMB", "CKMBINDEX", "TROPONINI" in the last 168 hours. BNP (last 3 results) No results for input(s): "PROBNP" in the last 8760 hours. HbA1C: No results for input(s): "HGBA1C" in the last 72 hours. CBG: No results for input(s): "GLUCAP" in  the last 168 hours. Lipid Profile: No results for input(s): "CHOL", "HDL", "LDLCALC", "TRIG", "CHOLHDL", "LDLDIRECT" in the last 72 hours. Thyroid Function Tests: No results for input(s): "TSH", "T4TOTAL", "FREET4", "T3FREE", "THYROIDAB" in the last 72 hours. Anemia Panel: No results for input(s): "VITAMINB12", "FOLATE", "FERRITIN", "TIBC", "IRON", "RETICCTPCT" in the last 72 hours. Sepsis Labs: No results for input(s): "PROCALCITON", "LATICACIDVEN" in the last 168 hours.  No results found for this or any previous visit (from the past 240 hour(s)).   Radiology Studies: No results found.  Scheduled Meds:  amLODipine  5 mg Oral Daily   carvedilol  6.25 mg Oral BID WC   enoxaparin (LOVENOX) injection  30 mg Subcutaneous Daily   feeding supplement  1 Container Oral BID BM   leptospermum manuka honey  1 Application Topical Daily   mometasone-formoterol  2 puff Inhalation BID   mupirocin cream   Topical BID   nicotine  21 mg Transdermal Daily   nutrition supplement (JUVEN)  1 packet Oral BID BM   mouth rinse  15 mL Mouth Rinse 4 times per day   pantoprazole  40 mg Oral Daily   QUEtiapine  25 mg Oral BID   QUEtiapine  75 mg Oral QHS   umeclidinium bromide  1 puff Inhalation Daily   Continuous Infusions:   LOS: 77 days   Hughie Closs, MD [facility]  08/28/2022, 10:01 AM   *Please note that this is a verbal dictation  therefore any spelling or grammatical errors are due to the "Dragon Medical One" system interpretation.  Please page via Amion and do not message via secure chat for urgent patient care matters. Secure chat can be used for non urgent patient care matters.  How to contact the [facility] Attending or Consulting provider 7A - 7P or covering provider during after hours 7P -7A, for this patient?  Check the care team in [facility] and look for a) attending/consulting TRH provider listed and b) the [facility] team listed. Page or secure chat 7A-7P. Log into [email or web address] and use [place]'s universal password to access. If you do not have the password, please contact the hospital operator. Locate the [facility] provider you are looking for under [facility] and page to a number that you can be directly reached. If you still have difficulty reaching the provider, please page the [facility] (Director on Call) for the Hospitalists listed on amion for assistance.

## 2022-08-28 NOTE — TOC Progression Note (Addendum)
Transition of Care [facility]) - Progression Note    Patient Details  Name: Carla [name] MRN: [id number] Date of Birth: 01-05-1946  Transition of Care [facility]) CM/SW Contact  Doris Gruhn A [place], [place] Phone Number: 08/28/2022, 10:41 AM  Clinical Narrative:    CSW was contacted back by pt's husband Doug. CSW informed him that pt has been medically stable and could discharge home. He stated that he would reach out to his sons to assist taking pt back home at scheduled discharge date.  TOC was informed by provider that pt could discharge home tomorrow.   CSW followed up with pt's husband, Riley Lam to inform him of discharge home tomorrow. He said he or his son would provide transportation for pt home tomorrow.   CSW asked if pt wanted care at home, he stated "he had plenty of support" and said his two sons, and granddaughter all live with pt and husband.  CSW informed him that he can receive some home services with pt's insurance with personal care services for a few hours if he wants that support going forward. He declined services but stated he understood.   TOC will continue to follow.   CSW contacted pt's husband, Gala Romney, [phone]. CSW left voicemail as there was no answer. CSW will follow up another opportune time.   Expected Discharge Plan: Skilled Nursing Facility Barriers to Discharge: Continued Medical Work up  Expected Discharge Plan and Services   Discharge Planning Services: CM Consult Post Acute Care Choice: Long Term Acute Care (LTAC)                                         Social Determinants of Health (SDOH) Interventions SDOH Screenings   Food Insecurity: No Food Insecurity (07/28/2022)  Housing: Low Risk  (07/28/2022)  Transportation Needs: No Transportation Needs (07/28/2022)  Utilities: Not At Risk (07/28/2022)  Tobacco Use: High Risk (07/28/2022)    Readmission Risk Interventions    07/22/2022    1:43 PM  Readmission Risk Prevention Plan  Transportation  Screening Complete  Palliative Care Screening Complete

## 2022-08-29 ENCOUNTER — Other Ambulatory Visit (HOSPITAL_COMMUNITY): Payer: Self-pay

## 2022-08-29 DIAGNOSIS — J9602 Acute respiratory failure with hypercapnia: Secondary | ICD-10-CM | POA: Diagnosis not present

## 2022-08-29 DIAGNOSIS — J9601 Acute respiratory failure with hypoxia: Secondary | ICD-10-CM | POA: Diagnosis not present

## 2022-08-29 MED ORDER — QUETIAPINE FUMARATE 25 MG PO TABS: 25.0000 mg | ORAL_TABLET | ORAL | 0 refills | Status: DC

## 2022-08-29 MED ORDER — AMLODIPINE BESYLATE 5 MG PO TABS: 5.0000 mg | ORAL_TABLET | Freq: Every day | ORAL | 0 refills | Status: DC

## 2022-08-29 MED ORDER — CARVEDILOL 6.25 MG PO TABS: 6.2500 mg | ORAL_TABLET | Freq: Two times a day (BID) | ORAL | 0 refills | Status: DC

## 2022-08-29 MED ORDER — QUETIAPINE FUMARATE 25 MG PO TABS
25.0000 mg | ORAL_TABLET | ORAL | 0 refills | Status: DC
  Filled 2022-08-29: qty 60, 30d supply, fill #0

## 2022-08-29 MED ORDER — CARVEDILOL 6.25 MG PO TABS
6.2500 mg | ORAL_TABLET | Freq: Two times a day (BID) | ORAL | 0 refills | Status: DC
  Filled 2022-08-29: qty 60, 30d supply, fill #0

## 2022-08-29 MED ORDER — QUETIAPINE FUMARATE 25 MG PO TABS: 75.0000 mg | ORAL_TABLET | Freq: Every day | ORAL | 0 refills | Status: DC

## 2022-08-29 MED ORDER — AMLODIPINE BESYLATE 5 MG PO TABS
5.0000 mg | ORAL_TABLET | Freq: Every day | ORAL | 0 refills | Status: DC
  Filled 2022-08-29: qty 30, 30d supply, fill #0

## 2022-08-29 MED ORDER — QUETIAPINE FUMARATE 25 MG PO TABS
75.0000 mg | ORAL_TABLET | Freq: Every day | ORAL | 0 refills | Status: DC
  Filled 2022-08-29: qty 90, 30d supply, fill #0

## 2022-08-29 MED ORDER — UMECLIDINIUM BROMIDE 62.5 MCG/ACT IN AEPB
1.0000 | INHALATION_SPRAY | Freq: Every day | RESPIRATORY_TRACT | 0 refills | Status: DC
  Filled 2022-08-29: qty 30, 30d supply, fill #0

## 2022-08-29 MED ORDER — UMECLIDINIUM BROMIDE 62.5 MCG/ACT IN AEPB: 1.0000 | INHALATION_SPRAY | Freq: Every day | RESPIRATORY_TRACT | 0 refills | Status: DC

## 2022-08-29 NOTE — TOC Transition Note (Addendum)
Transition of Care [facility]) - CM/SW Discharge Note   Patient Details  Name: Carla [name] MRN: [id number] Date of Birth: January 07, 1946  [facility]) CM/SW Contact:  Janae Bridgeman, RN Phone Number: 08/29/2022, 12:44 PM   Clinical Narrative:    CM called and spoke with the patient's husband by phone and patient's son, Loraine Leriche plans to pick the patient up by car today to discharge home in the care of the patient's family.  The patient does not have a PCP and patient was set up with G[facility] Passe and appointment was included in the AVS.  Discharge medications will be sent to patient's local pharmacy - [facility] to be picked up by the husband and he is aware.  Orders placed for home health agency to provide PT and MSW in the home.  I was unable to reach the husband back by phone to clarify the agency.  I called Amy, CM with [facility] and waiting for a call back.  Patient was unable to be placed in a Memory care facility and home health will be established to further assist the patient/family in the home while patient's family is looked for memory care facility for placement in the outpatient setting.  I called [facility] and spoke with Allyssa, CM to provide home health services.  08/29/22-  CM spoke with [facility] and [facility] and they are unable to provide home health services in [facility].  [facility] accepted her for home health services - placed in AVS for family.   Final next level of care: Home w Home Health Services Barriers to Discharge: No Barriers Identified   Patient Goals and CMS Choice CMS Medicare.gov Compare Post Acute Care list provided to:: Patient Represenative (must comment) Choice offered to / list presented to : Spouse  Discharge Placement                         Discharge Plan and Services Additional resources added to the After Visit Summary for     Discharge Planning Services: CM Consult Post Acute  Care Choice: Resumption of Svcs/PTA Provider, Home Health                               Social Determinants of Health (SDOH) Interventions SDOH Screenings   Food Insecurity: No Food Insecurity (07/28/2022)  Housing: Low Risk  (07/28/2022)  Transportation Needs: No Transportation Needs (07/28/2022)  Utilities: Not At Risk (07/28/2022)  Tobacco Use: High Risk (07/28/2022)     Readmission Risk Interventions    07/22/2022    1:43 PM  Readmission Risk Prevention Plan  Transportation Screening Complete  Palliative Care Screening Complete

## 2022-08-29 NOTE — TOC Transition Note (Incomplete Revision)
Transition of Care [facility]) - CM/SW Discharge Note   Patient Details  Name: Carla [name] MRN: [id number] Date of Birth: January 07, 1946  Transition of Care [facility]) CM/SW Contact:  Janae Bridgeman, RN Phone Number: 08/29/2022, 12:44 PM   Clinical Narrative:    CM called and spoke with the patient's husband by phone and patient's son, Loraine Leriche plans to pick the patient up by car today to discharge home in the care of the patient's family.  The patient does not have a PCP and patient was set up with Gwinda Passe and appointment was included in the AVS.  Discharge medications will be sent to patient's local pharmacy - [facility] to be picked up by the husband and he is aware.  Orders placed for home health agency to provide PT and MSW in the home.  I was unable to reach the husband back by phone to clarify the agency.  I called Amy, CM with [facility] and waiting for a call back.  Patient was unable to be placed in a Memory care facility and home health will be established to further assist the patient/family in the home while patient's family is looked for memory care facility for placement in the outpatient setting.  I called [facility] and spoke with Allyssa, CM to provide home health services.  08/29/22-  CM spoke with [facility] and [facility] and they are unable to provide home health services in [facility].  [facility] her for home health services - placed in AVS for family.   Final next level of care: Home w Home Health Services Barriers to Discharge: No Barriers Identified   Patient Goals and CMS Choice CMS Medicare.gov Compare Post Acute Care list provided to:: Patient Represenative (must comment) Choice offered to / list presented to : Spouse  Discharge Placement                         Discharge Plan and Services Additional resources added to the After Visit Summary for     Discharge Planning Services: CM Consult Post Acute  Care Choice: Resumption of Svcs/PTA Provider, Home Health                               Social Determinants of Health (SDOH) Interventions SDOH Screenings   Food Insecurity: No Food Insecurity (07/28/2022)  Housing: Low Risk  (07/28/2022)  Transportation Needs: No Transportation Needs (07/28/2022)  Utilities: Not At Risk (07/28/2022)  Tobacco Use: High Risk (07/28/2022)     Readmission Risk Interventions    07/22/2022    1:43 PM  Readmission Risk Prevention Plan  Transportation Screening Complete  Palliative Care Screening Complete

## 2022-08-29 NOTE — Progress Notes (Addendum)
PT Cancellation Note  Patient Details Name: NYEMAH ERRIGO MRN: [id number] DOB: 1945/09/27   Cancelled Treatment:    Reason Eval/Treat Not Completed: (P) Patient declined, no reason specified, pt sleeping on arrival, able to be roused, declining all mobility despite encouragement. Will check back as schedule allows to continue with PT POC.  Lenora Boys. PTA Acute Rehabilitation Services Office: [phone]    Catalina Antigua 08/29/2022, 10:38 AM

## 2022-08-29 NOTE — Discharge Summary (Addendum)
Physician Discharge Summary  Carla [name] AVW:[id number] DOB: 1945/09/08 DOA: 06/12/2022  PCP: Patient, [name]  Admit date: 06/12/2022 Discharge date: 08/29/2022 30 Day Unplanned Readmission Risk Score    Flowsheet Row ED to Hosp-Admission (Current) from 06/12/2022 in [facility]  30 Day Unplanned Readmission Risk Score (%) 22.15 Filed at 08/29/2022 0801       This score is the patient's risk of an unplanned readmission within 30 days of being discharged (0 -100%). The score is based on dignosis, age, lab data, medications, orders, and past utilization.   Low:  0-14.9   Medium: 15-21.9   High: 22-29.9   Extreme: 30 and above          Admitted From: Home Disposition: Home  Recommendations for Outpatient Follow-up:  Follow up with PCP in 1-2 weeks Please obtain BMP/CBC in one week Follow-up with psychiatry in 2 to 4 weeks Please follow up with your PCP on the following pending results: Unresulted Labs (From admission, onward)     Start     Ordered   06/12/22 1810  Miscellaneous test (send-out)  Once,   R        06/12/22 1810              Home Health: Yes Equipment/Devices: None  Discharge Condition: Stable CODE STATUS: DNR Diet recommendation: Cardiac  Subjective: Seen and examined.  Alert and oriented to self and place.  No complaints.  Brief/Interim Summary: 45 yof w/ COPD/emphysema tobacco use,Hypertension, Dementia, Anxiety, Depression, GERD, Primary hyperparathyroidism, Vit D deficiency, HLD, Osteoporosis , found by family unresponsive and brought to ER on 06/12/22 and found to have acute on chronic hypercapnia on ABG>Intubated in ER and treated for COPD exacerbation. She had prolonged complicated hospitalization charted as below with multiple ICU admissions, needing tracheostomy placement, having encephalopathy with baseline dementia and pulling out her PICC line tracheostomy.   Significant Events  3/20 Admitted after being found  unresponsive, intubated on ED arrival  CT angio chest >> atherosclerosis, severe coronary calcification, severe LVH, mild centrilobular emphysema, 2 mm nodule LUL, 4 mm nodule LUL Blood culture  >> Haemophilus influenzae 3/22 Had oxygen desaturation and low Vt with SBT this morning. Echo 06/14/22 >> EF 60 to 65%, mod LVH, grade 1 DD, mild/mod AR 3/25 bronch for peak pressures, low tidal volumes with mucus cast at end of ETT, thick secretions caking tube 3/29 Bronched again with purulent secretions LLL with plugging and LLL atelectasis, some increase in O2 requirement on vent, zosyn started for VAP coverage. MRSA nare swab today negative.  3/30 Remains intubated with waxing and weaning agitation on fentanyl and precedex  3/31 Remains on vent with continued issues with agitation when sedation lightened  4/2 family meeting, made DNR, plans for one way extubation 4/5 4/5-no overnight events, plan is for one-way extubation today 4/6-family changes mind regarding one-way extubation, plan will be for tracheostomy 4/8 trach placed 4/10 failed overnight weaning trial, back on PRVC and sedation  was added. Subsequently sedation weaned and enteral agents were dc and or decreased.  4/11 failed psv w some distress. Fever. Abx added 4/12 cont efforts at psv. Most awake she has been in days   4/13 Tolerating PS.  4/15 Has been on ATC x 1:45 minutes, RR 40 and accessory muscle use, placing back on vent 4/18 Cortrak clogged and thus replaced. Still working on weaning completed CTx for PNA. 48 hrs on ATC  4/19 off vent x 72 hours  so removed from room.  Planning on changing trach to cuffless.  Continuing to work with PMV.  Hypoglycemia adding D5 to maintenance fluids 4/20 transfer out of ICU  4/21 transfer to [facility].  4/26: Patient pulled tracheostomy tube.  4/28: Patient pulled PICC line, PIV inserted, Soft restraints. 4/30 : PCCM signed off-she is doing well without the tracheostomy 6/4 Plan for discharge home  with support TOC on board and planning to make arrangement.       Acute hypoxic/hypercapnic respiratory failure: Intubation and mechanical ventilation from 3/20-4/5. Tracheostomy on 4/6. Pulled out trach on 4/26.  Trach site healed. She is on RA. No new complaints.    Acute metabolic encephalopathy in the background of dementia: based on previous reports it looks like at baseline patient has regular conversations with herself and wanders out of the house.   Presented with unresponsiveness s/p extensive workup including  head CT negative for acute abnormality.  Felt to be due to respiratory failure and also with ICU delirium.   Remains confused/disoriented with periods of clarity.   Patient was seen by psychiatry and started on Seroquel.  Currently off benzos. -Dose of Seroquel increased to 75 mg at bedtime and Seroquel 25 mg BID.  -Reorientation and delirium precaution No changes in meds.  Follow-up with psychiatry as outpatient.   Dysphagia -Continue dysphagia 3 diet.    Multiple falls in the hospital :  found on the floor on 5/2  and 5/8.  No serious injury.  Negative imaging. -Fall precaution   Pulmonary nodules: Incidental finding on CT 06/12/2022 - 2 mm and 4 mm noncalcified LUL lung nodules.   -May need outpatient surveillance depending on her prognosis and goals of care going forward.   Acute kidney injury: Resolved   Essential hypertension:  Blood pressure controlled.  Added Norvasc and Coreg here which we will prescribe at discharge.   Chronic diastolic CHF: Currently euvolemic   Hypokalemia: Resolved   Physical deconditioning: therapy evaluations.  TOC on  board.    Severe protein calorie malnutrition Body mass index is 15.97 kg/m. Nutrition Problem: Severe Malnutrition Etiology: chronic illness (COPD, dementia) Signs/Symptoms: severe fat depletion, severe muscle depletion Interventions: Ensure Enlive (each supplement provides 350kcal and 20 grams of protein),  Juven  Discharge plan was discussed with patient and/or family member and they verbalized understanding and agreed with it.  Discharge Diagnoses:  Principal Problem:   Acute respiratory failure with hypoxia and hypercapnia (HCC) Active Problems:   COPD with exacerbation (HCC)   Hyperglycemia   Acute metabolic encephalopathy   HTN (hypertension)   Tobacco abuse   Anxiety   Peripheral vascular disease (HCC)   Pulmonary nodules   Dementia (HCC)   Protein-calorie malnutrition, severe (HCC)   Urinary retention   Hypervolemia   Encephalopathy acute   Tracheostomy in place [facility])   AKI (acute kidney injury) (HCC)   Hypernatremia   Fever   Acute respiratory failure with hypercapnia (HCC)   Hyponatremia   Pressure injury of skin    Discharge Instructions   Allergies as of 08/29/2022       Reactions   Aricept [donepezil Hcl] Diarrhea   Calcium-containing Compounds    Exelon [rivastigmine] Rash        Medication List     TAKE these medications    amLODipine 5 MG tablet Commonly known as: NORVASC Take 1 tablet (5 mg total) by mouth daily.   carvedilol 6.25 MG tablet Commonly known as: COREG Take 1 tablet (6.25 mg total)  by mouth 2 (two) times daily with a meal.   QUEtiapine 25 MG tablet Commonly known as: SEROQUEL Take 1 tablet (25 mg total) by mouth 2 (two) times daily.   QUEtiapine 25 MG tablet Commonly known as: SEROQUEL Take 3 tablets (75 mg total) by mouth at bedtime.   umeclidinium bromide 62.5 MCG/ACT Aepb Commonly known as: INCRUSE ELLIPTA Inhale 1 puff into the lungs daily. Start taking on: August 30, 2022               Durable Medical Equipment  (From admission, onward)           Start     Ordered   08/05/22 1104  For home use only DME Nebulizer machine  Once       Question Answer Comment  Patient needs a nebulizer to treat with the following condition COPD (chronic obstructive pulmonary disease) (HCC)   Length of Need Lifetime       08/05/22 1103            Follow-up Information     PCP Follow up in 1 week(s).                 Allergies  Allergen Reactions   Aricept [Donepezil Hcl] Diarrhea   Calcium-Containing Compounds    Exelon [Rivastigmine] Rash    Consultations: Psychiatry, palliative care, critical care   Procedures/Studies: No results found.   Discharge Exam: Vitals:   08/29/22 0503 08/29/22 0819  BP: (!) 121/58 (!) 157/69  Pulse: 71 78  Resp: 18 18  Temp: (!) 97.2 F (36.2 C) 97.6 F (36.4 C)  SpO2: (!) 87% 91%   Vitals:   08/28/22 2010 08/28/22 2049 08/29/22 0503 08/29/22 0819  BP: 114/66  (!) 121/58 (!) 157/69  Pulse: 79  71 78  Resp: 18  18 18   Temp: 97.6 F (36.4 C)  (!) 97.2 F (36.2 C) 97.6 F (36.4 C)  TempSrc: Oral  Oral Oral  SpO2: 94% 94% (!) 87% 91%  Weight:      Height:        General: Pt is alert, awake, not in acute distress Cardiovascular: RRR, S1/S2 +, no rubs, no gallops Respiratory: CTA bilaterally, no wheezing, no rhonchi Abdominal: Soft, NT, ND, bowel sounds + Extremities: no edema, no cyanosis    The results of significant diagnostics from this hospitalization (including imaging, microbiology, ancillary and laboratory) are listed below for reference.     Microbiology: No results found for this or any previous visit (from the past 240 hour(s)).   Labs: BNP (last 3 results) Recent Labs    06/12/22 1606  BNP 467.0*   Basic Metabolic Panel: No results for input(s): "NA", "K", "CL", "CO2", "GLUCOSE", "BUN", "CREATININE", "CALCIUM", "MG", "PHOS" in the last 168 hours. Liver Function Tests: No results for input(s): "AST", "ALT", "ALKPHOS", "BILITOT", "PROT", "ALBUMIN" in the last 168 hours. No results for input(s): "LIPASE", "AMYLASE" in the last 168 hours. No results for input(s): "AMMONIA" in the last 168 hours. CBC: No results for input(s): "WBC", "NEUTROABS", "HGB", "HCT", "MCV", "PLT" in the last 168 hours. Cardiac Enzymes: No  results for input(s): "CKTOTAL", "CKMB", "CKMBINDEX", "TROPONINI" in the last 168 hours. BNP: Invalid input(s): "POCBNP" CBG: No results for input(s): "GLUCAP" in the last 168 hours. D-Dimer No results for input(s): "DDIMER" in the last 72 hours. Hgb A1c No results for input(s): "HGBA1C" in the last 72 hours. Lipid Profile No results for input(s): "CHOL", "HDL", "LDLCALC", "TRIG", "CHOLHDL", "LDLDIRECT" in the  last 72 hours. Thyroid function studies No results for input(s): "TSH", "T4TOTAL", "T3FREE", "THYROIDAB" in the last 72 hours.  Invalid input(s): "FREET3" Anemia work up No results for input(s): "VITAMINB12", "FOLATE", "FERRITIN", "TIBC", "IRON", "RETICCTPCT" in the last 72 hours. Urinalysis    Component Value Date/Time   COLORURINE YELLOW 07/04/2022 1612   APPEARANCEUR CLOUDY (A) 07/04/2022 1612   LABSPEC 1.014 07/04/2022 1612   PHURINE 8.0 07/04/2022 1612   GLUCOSEU NEGATIVE 07/04/2022 1612   HGBUR NEGATIVE 07/04/2022 1612   BILIRUBINUR NEGATIVE 07/04/2022 1612   KETONESUR NEGATIVE 07/04/2022 1612   PROTEINUR 30 (A) 07/04/2022 1612   NITRITE NEGATIVE 07/04/2022 1612   LEUKOCYTESUR LARGE (A) 07/04/2022 1612   Sepsis Labs No results for input(s): "WBC" in the last 168 hours.  Invalid input(s): "PROCALCITONIN", "LACTICIDVEN" Microbiology No results found for this or any previous visit (from the past 240 hour(s)).   Time coordinating discharge: Over 30 minutes  SIGNED:   Hughie Closs, MD  [facility] 08/29/2022, 9:05 AM *Please note that this is a verbal dictation therefore any spelling or grammatical errors are due to the "[employer] One" system interpretation. If 7PM-7AM, please contact night-coverage [email or web address]

## 2022-08-30 ENCOUNTER — Ambulatory Visit: Payer: Self-pay

## 2022-08-30 NOTE — Telephone Encounter (Signed)
  Chief Complaint: medication problem Symptoms: pt ill and angry, slaps pills out of son's hand when he tries to give them to her Frequency: today Pertinent Negatives: son denies pt being a threat to self or others Disposition: [] ED /[] Urgent Care (no appt availability in office) / [] Appointment(In office/virtual)/ []  [facility]/ [x] Home Care/ [] Refused Recommended Disposition /[] [place] [facility]/ []  Follow-up with PCP Additional Notes: pt was dc'd from [facility] hospital yesterday. Been there since 06/12/22. Son is trying to get her to take medications but pt will slap them out of his hand or refuse to take them. Recommended he wait a bit and try again or crush medications and put in something that pt will eat, or let someone else try to give the medications to her. Son verbalized understanding and will call back if needs further assistance.   Reason for Disposition  Caller has medicine question only, adult not sick, AND triager answers question  Answer Assessment - Initial Assessment Questions 1. NAME of MEDICINE: "What medicine(s) are you calling about?"     All pt's meds 2. QUESTION: "What is your question?" (e.g., double dose of medicine, side effect)     Pt not taking her medication, was dc'd from hospital yesterday, slaps pills out of his hands  3. PRESCRIBER: "Who prescribed the medicine?" Reason: if prescribed by specialist, call should be referred to that group.     Hospital provider 4. SYMPTOMS: "Do you have any symptoms?" If Yes, ask: "What symptoms are you having?"  "How bad are the symptoms (e.g., mild, moderate, severe)     Pt ill and angry  Protocols used: Medication Question Call-A-AH

## 2022-09-10 ENCOUNTER — Telehealth (INDEPENDENT_AMBULATORY_CARE_PROVIDER_SITE_OTHER): Payer: Self-pay | Admitting: Primary Care

## 2022-09-10 NOTE — Telephone Encounter (Signed)
Left VM with pt.

## 2022-09-11 ENCOUNTER — Inpatient Hospital Stay (INDEPENDENT_AMBULATORY_CARE_PROVIDER_SITE_OTHER): Payer: HMO | Admitting: Primary Care

## 2023-05-30 ENCOUNTER — Emergency Department (HOSPITAL_COMMUNITY)

## 2023-05-30 ENCOUNTER — Inpatient Hospital Stay (HOSPITAL_COMMUNITY)
Admission: EM | Admit: 2023-05-30 | Discharge: 2023-06-13 | DRG: 871 | Disposition: A | Discharge status: Home Health | Attending: Internal Medicine | Admitting: Internal Medicine

## 2023-05-30 ENCOUNTER — Other Ambulatory Visit: Payer: Self-pay

## 2023-05-30 ENCOUNTER — Encounter (HOSPITAL_COMMUNITY): Payer: Self-pay

## 2023-05-30 DIAGNOSIS — F0394 Unspecified dementia, unspecified severity, with anxiety: Secondary | ICD-10-CM | POA: Diagnosis not present

## 2023-05-30 DIAGNOSIS — J9621 Acute and chronic respiratory failure with hypoxia: Principal | ICD-10-CM | POA: Diagnosis present

## 2023-05-30 DIAGNOSIS — Z1152 Encounter for screening for COVID-19: Secondary | ICD-10-CM | POA: Diagnosis not present

## 2023-05-30 DIAGNOSIS — E872 Acidosis, unspecified: Secondary | ICD-10-CM | POA: Diagnosis not present

## 2023-05-30 DIAGNOSIS — R4182 Altered mental status, unspecified: Secondary | ICD-10-CM | POA: Diagnosis not present

## 2023-05-30 DIAGNOSIS — I10 Essential (primary) hypertension: Secondary | ICD-10-CM | POA: Diagnosis present

## 2023-05-30 DIAGNOSIS — G9341 Metabolic encephalopathy: Secondary | ICD-10-CM | POA: Diagnosis present

## 2023-05-30 DIAGNOSIS — Z90711 Acquired absence of uterus with remaining cervical stump: Secondary | ICD-10-CM

## 2023-05-30 DIAGNOSIS — J9622 Acute and chronic respiratory failure with hypercapnia: Secondary | ICD-10-CM | POA: Diagnosis not present

## 2023-05-30 DIAGNOSIS — Z8249 Family history of ischemic heart disease and other diseases of the circulatory system: Secondary | ICD-10-CM

## 2023-05-30 DIAGNOSIS — R000 Tachycardia, unspecified: Secondary | ICD-10-CM | POA: Diagnosis not present

## 2023-05-30 DIAGNOSIS — A4189 Other specified sepsis: Principal | ICD-10-CM | POA: Diagnosis present

## 2023-05-30 DIAGNOSIS — J449 Chronic obstructive pulmonary disease, unspecified: Secondary | ICD-10-CM | POA: Diagnosis not present

## 2023-05-30 DIAGNOSIS — E7800 Pure hypercholesterolemia, unspecified: Secondary | ICD-10-CM | POA: Diagnosis present

## 2023-05-30 DIAGNOSIS — J9602 Acute respiratory failure with hypercapnia: Secondary | ICD-10-CM | POA: Diagnosis not present

## 2023-05-30 DIAGNOSIS — J9601 Acute respiratory failure with hypoxia: Secondary | ICD-10-CM | POA: Diagnosis not present

## 2023-05-30 DIAGNOSIS — Z720 Tobacco use: Secondary | ICD-10-CM | POA: Diagnosis not present

## 2023-05-30 DIAGNOSIS — R059 Cough, unspecified: Secondary | ICD-10-CM | POA: Diagnosis present

## 2023-05-30 DIAGNOSIS — Z7189 Other specified counseling: Secondary | ICD-10-CM | POA: Diagnosis not present

## 2023-05-30 DIAGNOSIS — F17210 Nicotine dependence, cigarettes, uncomplicated: Secondary | ICD-10-CM | POA: Diagnosis present

## 2023-05-30 DIAGNOSIS — F0390 Unspecified dementia without behavioral disturbance: Secondary | ICD-10-CM | POA: Insufficient documentation

## 2023-05-30 DIAGNOSIS — I2510 Atherosclerotic heart disease of native coronary artery without angina pectoris: Secondary | ICD-10-CM | POA: Diagnosis not present

## 2023-05-30 DIAGNOSIS — I509 Heart failure, unspecified: Secondary | ICD-10-CM | POA: Diagnosis not present

## 2023-05-30 DIAGNOSIS — F32A Depression, unspecified: Secondary | ICD-10-CM | POA: Diagnosis present

## 2023-05-30 DIAGNOSIS — Z860100 Personal history of colon polyps, unspecified: Secondary | ICD-10-CM

## 2023-05-30 DIAGNOSIS — E440 Moderate protein-calorie malnutrition: Secondary | ICD-10-CM | POA: Diagnosis not present

## 2023-05-30 DIAGNOSIS — Z8619 Personal history of other infectious and parasitic diseases: Secondary | ICD-10-CM

## 2023-05-30 DIAGNOSIS — Z781 Physical restraint status: Secondary | ICD-10-CM

## 2023-05-30 DIAGNOSIS — J439 Emphysema, unspecified: Secondary | ICD-10-CM | POA: Diagnosis not present

## 2023-05-30 DIAGNOSIS — R0989 Other specified symptoms and signs involving the circulatory and respiratory systems: Secondary | ICD-10-CM | POA: Diagnosis not present

## 2023-05-30 DIAGNOSIS — Z681 Body mass index (BMI) 19 or less, adult: Secondary | ICD-10-CM | POA: Diagnosis not present

## 2023-05-30 DIAGNOSIS — F01B18 Vascular dementia, moderate, with other behavioral disturbance: Secondary | ICD-10-CM | POA: Diagnosis not present

## 2023-05-30 DIAGNOSIS — J121 Respiratory syncytial virus pneumonia: Secondary | ICD-10-CM | POA: Diagnosis not present

## 2023-05-30 DIAGNOSIS — D72829 Elevated white blood cell count, unspecified: Secondary | ICD-10-CM | POA: Diagnosis not present

## 2023-05-30 DIAGNOSIS — E876 Hypokalemia: Secondary | ICD-10-CM | POA: Diagnosis present

## 2023-05-30 DIAGNOSIS — G9340 Encephalopathy, unspecified: Secondary | ICD-10-CM

## 2023-05-30 DIAGNOSIS — I6782 Cerebral ischemia: Secondary | ICD-10-CM | POA: Diagnosis not present

## 2023-05-30 DIAGNOSIS — R0602 Shortness of breath: Secondary | ICD-10-CM | POA: Diagnosis not present

## 2023-05-30 DIAGNOSIS — J441 Chronic obstructive pulmonary disease with (acute) exacerbation: Secondary | ICD-10-CM | POA: Diagnosis not present

## 2023-05-30 DIAGNOSIS — M810 Age-related osteoporosis without current pathological fracture: Secondary | ICD-10-CM | POA: Diagnosis present

## 2023-05-30 DIAGNOSIS — J811 Chronic pulmonary edema: Secondary | ICD-10-CM | POA: Diagnosis not present

## 2023-05-30 DIAGNOSIS — Z79899 Other long term (current) drug therapy: Secondary | ICD-10-CM

## 2023-05-30 DIAGNOSIS — I739 Peripheral vascular disease, unspecified: Secondary | ICD-10-CM | POA: Diagnosis not present

## 2023-05-30 DIAGNOSIS — Z90722 Acquired absence of ovaries, bilateral: Secondary | ICD-10-CM

## 2023-05-30 DIAGNOSIS — Z91148 Patient's other noncompliance with medication regimen for other reason: Secondary | ICD-10-CM

## 2023-05-30 DIAGNOSIS — R918 Other nonspecific abnormal finding of lung field: Secondary | ICD-10-CM | POA: Diagnosis not present

## 2023-05-30 DIAGNOSIS — R0902 Hypoxemia: Secondary | ICD-10-CM | POA: Diagnosis not present

## 2023-05-30 DIAGNOSIS — J9811 Atelectasis: Secondary | ICD-10-CM | POA: Diagnosis not present

## 2023-05-30 DIAGNOSIS — D751 Secondary polycythemia: Secondary | ICD-10-CM | POA: Diagnosis present

## 2023-05-30 DIAGNOSIS — E8770 Fluid overload, unspecified: Secondary | ICD-10-CM | POA: Diagnosis not present

## 2023-05-30 DIAGNOSIS — T380X5A Adverse effect of glucocorticoids and synthetic analogues, initial encounter: Secondary | ICD-10-CM | POA: Diagnosis not present

## 2023-05-30 DIAGNOSIS — Z5986 Financial insecurity: Secondary | ICD-10-CM

## 2023-05-30 DIAGNOSIS — Z515 Encounter for palliative care: Secondary | ICD-10-CM | POA: Diagnosis not present

## 2023-05-30 DIAGNOSIS — I700 Atherosclerosis of aorta: Secondary | ICD-10-CM | POA: Diagnosis not present

## 2023-05-30 DIAGNOSIS — J440 Chronic obstructive pulmonary disease with acute lower respiratory infection: Secondary | ICD-10-CM | POA: Diagnosis present

## 2023-05-30 DIAGNOSIS — Z825 Family history of asthma and other chronic lower respiratory diseases: Secondary | ICD-10-CM

## 2023-05-30 DIAGNOSIS — R413 Other amnesia: Secondary | ICD-10-CM | POA: Diagnosis present

## 2023-05-30 DIAGNOSIS — E210 Primary hyperparathyroidism: Secondary | ICD-10-CM | POA: Diagnosis present

## 2023-05-30 DIAGNOSIS — K219 Gastro-esophageal reflux disease without esophagitis: Secondary | ICD-10-CM | POA: Diagnosis present

## 2023-05-30 LAB — BLOOD GAS, ARTERIAL
Acid-Base Excess: 2 mmol/L (ref 0.0–2.0)
Bicarbonate: 29.7 mmol/L — ABNORMAL HIGH (ref 20.0–28.0)
Drawn by: 10555
O2 Saturation: 99.1 %
Patient temperature: 37
pCO2 arterial: 59 mmHg — ABNORMAL HIGH (ref 32–48)
pH, Arterial: 7.31 — ABNORMAL LOW (ref 7.35–7.45)
pO2, Arterial: 111 mmHg — ABNORMAL HIGH (ref 83–108)

## 2023-05-30 LAB — LACTIC ACID, PLASMA
Lactic Acid, Venous: 2.6 mmol/L (ref 0.5–1.9)
Lactic Acid, Venous: 1.5 mmol/L (ref 0.5–1.9)

## 2023-05-30 LAB — CBC WITH DIFFERENTIAL/PLATELET
Abs Immature Granulocytes: 0.03 10*3/uL (ref 0.00–0.07)
Basophils Absolute: 0 10*3/uL (ref 0.0–0.1)
Basophils Relative: 0 %
Eosinophils Absolute: 0 10*3/uL (ref 0.0–0.5)
Eosinophils Relative: 0 %
HCT: 58.1 % — ABNORMAL HIGH (ref 36.0–46.0)
Hemoglobin: 18.6 g/dL — ABNORMAL HIGH (ref 12.0–15.0)
Immature Granulocytes: 0 %
Lymphocytes Relative: 6 %
Lymphs Abs: 0.7 10*3/uL (ref 0.7–4.0)
MCH: 30.3 pg (ref 26.0–34.0)
MCHC: 32 g/dL (ref 30.0–36.0)
MCV: 94.6 fL (ref 80.0–100.0)
Monocytes Absolute: 0.6 10*3/uL (ref 0.1–1.0)
Monocytes Relative: 6 %
Neutro Abs: 9.2 10*3/uL — ABNORMAL HIGH (ref 1.7–7.7)
Neutrophils Relative %: 88 %
Platelets: 243 10*3/uL (ref 150–400)
RBC: 6.14 MIL/uL — ABNORMAL HIGH (ref 3.87–5.11)
RDW: 15.2 % (ref 11.5–15.5)
WBC: 10.5 10*3/uL (ref 4.0–10.5)
nRBC: 0 % (ref 0.0–0.2)

## 2023-05-30 LAB — RESP PANEL BY RT-PCR (RSV, FLU A&B, COVID)  RVPGX2
Influenza A by PCR: NEGATIVE
Influenza B by PCR: NEGATIVE
Resp Syncytial Virus by PCR: POSITIVE — AB
SARS Coronavirus 2 by RT PCR: NEGATIVE

## 2023-05-30 LAB — COMPREHENSIVE METABOLIC PANEL
ALT: 16 U/L (ref 0–44)
AST: 29 U/L (ref 15–41)
Albumin: 3.9 g/dL (ref 3.5–5.0)
Alkaline Phosphatase: 113 U/L (ref 38–126)
Anion gap: 11 (ref 5–15)
BUN: 27 mg/dL — ABNORMAL HIGH (ref 8–23)
CO2: 27 mmol/L (ref 22–32)
Calcium: 9.8 mg/dL (ref 8.9–10.3)
Chloride: 101 mmol/L (ref 98–111)
Creatinine, Ser: 1.07 mg/dL — ABNORMAL HIGH (ref 0.44–1.00)
GFR, Estimated: 53 mL/min — ABNORMAL LOW (ref >60)
Glucose, Bld: 169 mg/dL — ABNORMAL HIGH (ref 70–99)
Potassium: 5.1 mmol/L (ref 3.5–5.1)
Sodium: 139 mmol/L (ref 135–145)
Total Bilirubin: 1 mg/dL (ref 0.0–1.2)
Total Protein: 8.5 g/dL — ABNORMAL HIGH (ref 6.5–8.1)

## 2023-05-30 LAB — BLOOD GAS, VENOUS
Acid-base deficit: 0.3 mmol/L (ref 0.0–2.0)
Bicarbonate: 27.4 mmol/L (ref 20.0–28.0)
Drawn by: 7049
O2 Saturation: 90.8 %
Patient temperature: 36.5
pCO2, Ven: 56 mmHg (ref 44–60)
pH, Ven: 7.3 (ref 7.25–7.43)
pO2, Ven: 60 mmHg — ABNORMAL HIGH (ref 32–45)

## 2023-05-30 LAB — PROCALCITONIN: Procalcitonin: 0.42 ng/mL

## 2023-05-30 LAB — PROTIME-INR
INR: 1.1 (ref 0.8–1.2)
Prothrombin Time: 14.1 s (ref 11.4–15.2)

## 2023-05-30 LAB — APTT: aPTT: 29 s (ref 24–36)

## 2023-05-30 MED ORDER — PREDNISONE 20 MG PO TABS: 40.0000 mg | ORAL_TABLET | Freq: Every day | ORAL | Status: DC

## 2023-05-30 MED ORDER — SODIUM CHLORIDE 0.9 % IV SOLN
500.0000 mg | INTRAVENOUS | Status: AC
  Administered 2023-05-31 – 2023-06-02 (×3): 500 mg via INTRAVENOUS
  Filled 2023-05-30 (×3): qty 5

## 2023-05-30 MED ORDER — ORAL CARE MOUTH RINSE
15.0000 mL | OROMUCOSAL | Status: DC
  Administered 2023-05-31 – 2023-06-13 (×44): 15 mL via OROMUCOSAL

## 2023-05-30 MED ORDER — ALBUTEROL SULFATE (2.5 MG/3ML) 0.083% IN NEBU: 10.0000 mg | INHALATION_SOLUTION | RESPIRATORY_TRACT | Status: AC

## 2023-05-30 MED ORDER — ONDANSETRON HCL 4 MG/2ML IJ SOLN: 4.0000 mg | Freq: Four times a day (QID) | INTRAMUSCULAR | Status: DC | PRN

## 2023-05-30 MED ORDER — ACETAMINOPHEN 650 MG RE SUPP: 650.0000 mg | Freq: Four times a day (QID) | RECTAL | Status: DC | PRN

## 2023-05-30 MED ORDER — NICOTINE 21 MG/24HR TD PT24
21.0000 mg | MEDICATED_PATCH | Freq: Every day | TRANSDERMAL | Status: DC | PRN
  Administered 2023-06-06: 21 mg via TRANSDERMAL
  Filled 2023-05-30: qty 1

## 2023-05-30 MED ORDER — ACETAMINOPHEN 325 MG PO TABS: 650.0000 mg | ORAL_TABLET | Freq: Four times a day (QID) | ORAL | Status: DC | PRN

## 2023-05-30 MED ORDER — CHLORHEXIDINE GLUCONATE CLOTH 2 % EX PADS
6.0000 | MEDICATED_PAD | Freq: Every day | CUTANEOUS | Status: DC
  Administered 2023-05-30 – 2023-06-13 (×12): 6 via TOPICAL

## 2023-05-30 MED ORDER — IPRATROPIUM-ALBUTEROL 0.5-2.5 (3) MG/3ML IN SOLN
3.0000 mL | Freq: Four times a day (QID) | RESPIRATORY_TRACT | Status: DC
  Administered 2023-05-31 – 2023-06-04 (×14): 3 mL via RESPIRATORY_TRACT
  Filled 2023-05-30 (×13): qty 3

## 2023-05-30 MED ORDER — SODIUM CHLORIDE 0.9 % IV SOLN
2.0000 g | INTRAVENOUS | Status: AC
  Administered 2023-05-31 – 2023-06-03 (×4): 2 g via INTRAVENOUS
  Filled 2023-05-30 (×4): qty 20

## 2023-05-30 MED ORDER — LEVALBUTEROL HCL 0.63 MG/3ML IN NEBU
0.6300 mg | INHALATION_SOLUTION | Freq: Four times a day (QID) | RESPIRATORY_TRACT | Status: DC | PRN
  Administered 2023-05-30 – 2023-06-04 (×5): 0.63 mg via RESPIRATORY_TRACT
  Filled 2023-05-30 (×5): qty 3

## 2023-05-30 MED ORDER — METHYLPREDNISOLONE SODIUM SUCC 125 MG IJ SOLR
60.0000 mg | Freq: Two times a day (BID) | INTRAMUSCULAR | Status: DC
  Administered 2023-05-30: 60 mg via INTRAVENOUS
  Filled 2023-05-30: qty 2

## 2023-05-30 MED ORDER — LORAZEPAM 2 MG/ML IJ SOLN
0.5000 mg | INTRAMUSCULAR | Status: DC | PRN
  Administered 2023-05-30 – 2023-06-04 (×7): 0.5 mg via INTRAVENOUS
  Filled 2023-05-30 (×7): qty 1

## 2023-05-30 MED ORDER — ALBUTEROL SULFATE (2.5 MG/3ML) 0.083% IN NEBU
INHALATION_SOLUTION | RESPIRATORY_TRACT | Status: AC
Start: 2023-05-30 — End: 2023-05-30
  Administered 2023-05-30: 10 mg
  Filled 2023-05-30: qty 12

## 2023-05-30 MED ORDER — ORAL CARE MOUTH RINSE: 15.0000 mL | OROMUCOSAL | Status: DC | PRN

## 2023-05-30 MED ORDER — SODIUM CHLORIDE 0.9 % IV SOLN
2.0000 g | Freq: Once | INTRAVENOUS | Status: AC
  Administered 2023-05-30: 2 g via INTRAVENOUS
  Filled 2023-05-30: qty 20

## 2023-05-30 MED ORDER — LACTATED RINGERS IV SOLN: INTRAVENOUS | Status: AC

## 2023-05-30 MED ORDER — AMLODIPINE BESYLATE 5 MG PO TABS: 10.0000 mg | ORAL_TABLET | Freq: Every day | ORAL | Status: DC

## 2023-05-30 MED ORDER — GABAPENTIN 100 MG PO CAPS: 200.0000 mg | ORAL_CAPSULE | Freq: Every day | ORAL | Status: DC

## 2023-05-30 MED ORDER — ALBUTEROL (5 MG/ML) CONTINUOUS INHALATION SOLN
10.0000 mg | INHALATION_SOLUTION | RESPIRATORY_TRACT | Status: AC
  Filled 2023-05-30: qty 5

## 2023-05-30 MED ORDER — LORAZEPAM 2 MG/ML IJ SOLN
0.5000 mg | Freq: Once | INTRAMUSCULAR | Status: AC
  Administered 2023-05-30: 0.5 mg via INTRAVENOUS
  Filled 2023-05-30: qty 1

## 2023-05-30 MED ORDER — SODIUM CHLORIDE 0.9 % IV SOLN
500.0000 mg | Freq: Once | INTRAVENOUS | Status: AC
  Administered 2023-05-30: 500 mg via INTRAVENOUS
  Filled 2023-05-30: qty 5

## 2023-05-30 MED ORDER — ENOXAPARIN SODIUM 30 MG/0.3ML IJ SOSY
30.0000 mg | PREFILLED_SYRINGE | INTRAMUSCULAR | Status: DC
  Administered 2023-05-31 – 2023-06-13 (×14): 30 mg via SUBCUTANEOUS
  Filled 2023-05-30 (×15): qty 0.3

## 2023-05-30 MED ORDER — METHYLPREDNISOLONE SODIUM SUCC 125 MG IJ SOLR
125.0000 mg | Freq: Once | INTRAMUSCULAR | Status: AC
  Administered 2023-05-30: 125 mg via INTRAVENOUS
  Filled 2023-05-30: qty 2

## 2023-05-30 MED ORDER — GUAIFENESIN ER 600 MG PO TB12
600.0000 mg | ORAL_TABLET | Freq: Two times a day (BID) | ORAL | Status: DC
  Filled 2023-05-30: qty 1

## 2023-05-30 MED ORDER — FLUTICASONE FUROATE-VILANTEROL 100-25 MCG/ACT IN AEPB: 1.0000 | INHALATION_SPRAY | Freq: Every day | RESPIRATORY_TRACT | Status: DC

## 2023-05-30 MED ORDER — ONDANSETRON HCL 4 MG PO TABS: 4.0000 mg | ORAL_TABLET | Freq: Four times a day (QID) | ORAL | Status: DC | PRN

## 2023-05-30 NOTE — ED Notes (Signed)
 Portable xray at bedside.

## 2023-05-30 NOTE — ED Notes (Signed)
 Patient started on CAT , had to change to aerogen on BIPAP

## 2023-05-30 NOTE — ED Notes (Signed)
 ED Provider at bedside.

## 2023-05-30 NOTE — Consult Note (Signed)
 CODE SEPSIS - PHARMACY COMMUNICATION  **Broad Spectrum Antibiotics should be administered within 1 hour of Sepsis diagnosis**  Time Code Sepsis Called/Page Received: 1936  Antibiotics Ordered: azithromycin, ceftriaxone  Time of 1st antibiotic administration: 2008  Additional action taken by pharmacy: n/a  If necessary, Name of Provider/Nurse Contacted: n/a    Bettey Costa ,PharmD Clinical Pharmacist  05/30/2023  8:00 PM

## 2023-05-30 NOTE — H&P (Signed)
 History and Physical    Patient: Carla [name] YNW:[id number] DOB: May 30, 1945 DOA: 05/30/2023 DOS: the patient was seen and examined on 05/30/2023 PCP: Patient, [name]  Patient coming from: Home  Chief Complaint:  Chief Complaint  Patient presents with   Shortness of Breath   HPI: Carla [name] is a 78 y.o. female with medical history significant of dementia, hypertension, COPD, history of respiratory failure with extensive intubation, tracheostomy back in May 2024.  She presents to the hospital via EMS due to hypoxia and difficulty breathing.  Unfortunately the patient is unable to give much history due to dementia.  She does not know how long she has been short of breath.  She primarily asks for [place] to let her smoke a cigarette.  Her family had seen her and called EMS.  On their arrival she was severely hypertensive, tachycardic, tachypneic and hypoxic to 60% on room air.  She was immediately placed on a nonrebreather and brought to the hospital for evaluation.  No fevers, chills, nausea, vomiting.  Review of Systems: As mentioned in the history of present illness. All other systems reviewed and are negative. Past Medical History:  Diagnosis Date   Anxiety    ASCVD (arteriosclerotic cardiovascular disease)    No critical disease on cath in 8/97: mild AI with trival, if any l AS; negative stress nuclear in 2010   Atrophic vaginitis 04/18/2016   Back pain    Cervical disc herniation    Chronic bronchitis    Colon polyps    De Quervain's disease (radial styloid tenosynovitis) 10/14/2016   Dementia (HCC) 06/10/2020   Depression    Elevated hemoglobin A1c    Fasting hyperglycemia    GERD (gastroesophageal reflux disease)    Heart disease    HSV (herpes simplex virus) infection    Hydronephrosis    Hypercalcemia 12/30/2008   Qualifier: Diagnosis of  By: Lillia Mountain LPN, Brandi     Hypercholesteremia    Hypertension    Neck muscle spasm 04/23/2014   Onychomycosis 07/08/2015    Osteoporosis    Peripheral vascular disease (HCC)    stauts post aortabifemoral graft     Secondary erythrocytosis 08/05/2012   Secondary to COPD   Tobacco abuse    has stopped   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 Fields:Normal colon without evidence of polyps, masses, inflammatory changes/Normal retroflex view of the rectum   COLONOSCOPY N/A 08/24/2013   Procedure: COLONOSCOPY;  Surgeon: West Bali, MD;  Location: [facility];  Service: Endoscopy;  Laterality: N/A;  10:30-moved to [address] notified pt   lysis of adhesions  2000   PARTIAL HYSTERECTOMY     right kidney surgery following damage during arterial surgery     TOTAL ABDOMINAL HYSTERECTOMY W/ BILATERAL SALPINGOOPHORECTOMY  1975   Social History:  reports that she has been smoking cigarettes. She started smoking about 62 years ago. She has a 25 pack-year smoking history. She has never used smokeless tobacco. She reports that she does not drink alcohol and does not use drugs.  Allergies  Allergen Reactions   Aricept [Donepezil Hcl]     diarrhea   Calcium-Containing Compounds Other (See Comments)    Pt has hyperparathyroidism which results in hypercalcemia   Exelon [Rivastigmine Tartrate] Rash   Exelon [  Rivastigmine] Rash    Family History  Problem Relation Age of Onset   Heart attack Father    COPD Sister    Heart disease Sister    Diabetes Sister    Coronary artery disease Sister    Liver cancer Sister 61   Diabetes Sister    Diabetes Sister    COPD Sister    Heart attack Mother    COPD Brother    Heart attack Son    Colon cancer Neg Hx     Prior to Admission medications   Medication Sig Start Date End Date Taking? Authorizing Provider  amLODipine (NORVASC) 10 MG tablet TAKE ONE TABLET BY MOUTH ONCE DAILY. 01/29/21   Kerri Perches, MD   amLODipine (NORVASC) 2.5 MG tablet TAKE ONE TABLET BY MOUTH ONCE DAILY. 07/31/21   Kerri Perches, MD  gabapentin (NEURONTIN) 100 MG capsule TAKE (2) CAPSULES BY MOUTH AT BEDTIME. 05/30/21   Kerri Perches, MD  mirtazapine (REMERON) 15 MG tablet Take 1 tablet (15 mg total) by mouth at bedtime. 05/04/21   Kerri Perches, MD    Physical Exam: Vitals:   05/30/23 2030 05/30/23 2045 05/30/23 2055 05/30/23 2100  BP: (!) 157/86 (!) 157/87  (!) 147/85  Pulse: (!) 107 (!) 107  (!) 109  Resp: (!) 30 (!) 35  (!) 32  Temp:      TempSrc:      SpO2: 93% 90%  97%  Weight:      Height:   5' (1.524 m)    General: Elderly female who is cachectic. Awake and alert and oriented x1.. No acute cardiopulmonary distress.  HEENT: Normocephalic atraumatic.  Right and left ears normal in appearance.  Pupils equal, round, reactive to light. Extraocular muscles are intact. Sclerae anicteric and noninjected.  Moist mucosal membranes. No mucosal lesions.  Neck: Neck supple without lymphadenopathy. No carotid bruits. No masses palpated.  Cardiovascular: Regular rate with normal S1-S2 sounds. No murmurs, rubs, gallops auscultated. No JVD.  Respiratory: Short of breath with prolonged exhalation.  Rales on the left side.  Wheezing throughout.  Very tachypneic.  No accessory muscle use. Abdomen: Soft, nontender, nondistended. Active bowel sounds. No masses or hepatosplenomegaly  Skin: No rashes, lesions, or ulcerations.  Dry, warm to touch. 2+ dorsalis pedis and radial pulses. Musculoskeletal: No calf or leg pain. All major joints not erythematous nontender.  No upper or lower joint deformation.  Good ROM.  No contractures  Psychiatric: Lacks judgment and insight Neurologic: No focal neurological deficits. Strength is 5/5 and symmetric in upper and lower extremities.  Cranial nerves II through XII are grossly intact.  Data Reviewed: Results for orders placed or performed during the hospital encounter of 05/30/23  (from the past 24 hours)  Blood gas, arterial (at [facility])     Status: Abnormal   Collection Time: 05/30/23  7:35 PM  Result Value Ref Range   pH, Arterial 7.31 (L) 7.35 - 7.45   pCO2 arterial 59 (H) 32 - 48 mmHg   pO2, Arterial 111 (H) 83 - 108 mmHg   Bicarbonate 29.7 (H) 20.0 - 28.0 mmol/L   Acid-Base Excess 2.0 0.0 - 2.0 mmol/L   O2 Saturation 99.1 %   Patient temperature 37.0    Collection site RIGHT RADIAL    Drawn by [id number]    Allens test (pass/fail) PASS PASS  Resp panel by RT-PCR (RSV, Flu A&B, Covid) Anterior Nasal Swab     Status: Abnormal   Collection  Time: 05/30/23  7:45 PM   Specimen: Anterior Nasal Swab  Result Value Ref Range   SARS Coronavirus 2 by RT PCR NEGATIVE NEGATIVE   Influenza A by PCR NEGATIVE NEGATIVE   Influenza B by PCR NEGATIVE NEGATIVE   Resp Syncytial Virus by PCR POSITIVE (A) NEGATIVE  Lactic acid, plasma     Status: Abnormal   Collection Time: 05/30/23  7:45 PM  Result Value Ref Range   Lactic Acid, Venous 2.6 (HH) 0.5 - 1.9 mmol/L  Comprehensive metabolic panel     Status: Abnormal   Collection Time: 05/30/23  7:45 PM  Result Value Ref Range   Sodium 139 135 - 145 mmol/L   Potassium 5.1 3.5 - 5.1 mmol/L   Chloride 101 98 - 111 mmol/L   CO2 27 22 - 32 mmol/L   Glucose, Bld 169 (H) 70 - 99 mg/dL   BUN 27 (H) 8 - 23 mg/dL   Creatinine, Ser 1.61 (H) 0.44 - 1.00 mg/dL   Calcium 9.8 8.9 - 09.6 mg/dL   Total Protein 8.5 (H) 6.5 - 8.1 g/dL   Albumin 3.9 3.5 - 5.0 g/dL   AST 29 15 - 41 U/L   ALT 16 0 - 44 U/L   Alkaline Phosphatase 113 38 - 126 U/L   Total Bilirubin 1.0 0.0 - 1.2 mg/dL   GFR, Estimated 53 (L) >60 mL/min   Anion gap 11 5 - 15  CBC with Differential     Status: Abnormal   Collection Time: 05/30/23  7:45 PM  Result Value Ref Range   WBC 10.5 4.0 - 10.5 K/uL   RBC 6.14 (H) 3.87 - 5.11 MIL/uL   Hemoglobin 18.6 (H) 12.0 - 15.0 g/dL   HCT 04.5 (H) 40.9 - 81.1 %   MCV 94.6 80.0 - 100.0 fL   MCH 30.3 26.0 - 34.0 pg   MCHC 32.0 30.0  - 36.0 g/dL   RDW 91.4 78.2 - 95.6 %   Platelets 243 150 - 400 K/uL   nRBC 0.0 0.0 - 0.2 %   Neutrophils Relative % 88 %   Neutro Abs 9.2 (H) 1.7 - 7.7 K/uL   Lymphocytes Relative 6 %   Lymphs Abs 0.7 0.7 - 4.0 K/uL   Monocytes Relative 6 %   Monocytes Absolute 0.6 0.1 - 1.0 K/uL   Eosinophils Relative 0 %   Eosinophils Absolute 0.0 0.0 - 0.5 K/uL   Basophils Relative 0 %   Basophils Absolute 0.0 0.0 - 0.1 K/uL   Immature Granulocytes 0 %   Abs Immature Granulocytes 0.03 0.00 - 0.07 K/uL  Protime-INR     Status: None   Collection Time: 05/30/23  7:45 PM  Result Value Ref Range   Prothrombin Time 14.1 11.4 - 15.2 seconds   INR 1.1 0.8 - 1.2  APTT     Status: None   Collection Time: 05/30/23  7:45 PM  Result Value Ref Range   aPTT 29 24 - 36 seconds    No results found.   Assessment and Plan: No notes have been filed under this hospital service. Service: Hospitalist  Principal Problem:   Acute on chronic respiratory failure with hypoxia (HCC) Active Problems:   Essential hypertension   Malnutrition of moderate degree (HCC)   Memory loss   Tobacco abuse   COPD with acute exacerbation (HCC)  Respiratory failure with hypoxia Start BiPAP Ativan to help with anxiety noted to help with compliance. COPD with acute exacerbation, possible pneumonia Antibiotics: Rocephin  and azithromycin CBC daily Check procalcitonin DuoNeb's every 6 scheduled with albuterol every 2 when necessary Continue inhaled steroids and LA bronchodilator Solu-Medrol 60 mg IV every 12 hours Mucinex Dementia  Hypertension Continue antihypertensives Tobacco abuse We will give nicotine patch   Advance Care Planning:   Code Status: Full Code I called the patient's granddaughter who has medical power of attorney.  She stated that the patient is is full code.  I explained that if she were to be intubated, that she may not be able to get off the vent.  At the present time, she wants the patient to be  full code but will have a conversation with the patient's husband and a decision will be made tomorrow  Consults: None  Family Communication: Granddaughter  Severity of Illness: The appropriate patient status for this patient is INPATIENT. Inpatient status is judged to be reasonable and necessary in order to provide the required intensity of service to ensure the patient's safety. The patient's presenting symptoms, physical exam findings, and initial radiographic and laboratory data in the context of their chronic comorbidities is felt to place them at high risk for further clinical deterioration. Furthermore, it is not anticipated that the patient will be medically stable for discharge from the hospital within 2 midnights of admission.   * I certify that at the point of admission it is my clinical judgment that the patient will require inpatient hospital care spanning beyond 2 midnights from the point of admission due to high intensity of service, high risk for further deterioration and high frequency of surveillance required.*  Author: Levie Heritage, DO 05/30/2023 9:18 PM  For on call review [email or web address].

## 2023-05-30 NOTE — Sepsis Progress Note (Signed)
 Elink monitoring for the code sepsis protocol.

## 2023-05-30 NOTE — ED Provider Notes (Signed)
 [place] EMERGENCY DEPARTMENT AT [facility] Provider Note   CSN: [id number] Arrival date & time: 05/30/23  1926     History  Chief Complaint  Patient presents with   Shortness of Breath    Carla [name] is a 78 y.o. female.   Shortness of Breath  This patient is a 78 year old female history of hypertension on amlodipine, history of COPD, this patient was admitted with hypoxic respiratory failure about 1 year ago after being admitted to the hospital for approximately 2-1/2 months.  The patient has a known history of severe tobacco use and emphysema, hypertension, there is some dementia, anxiety, she had acute on chronic hypercapnic respiratory failure and was intubated in the emergency department spending time in the ICU multiple times, she had a tracheostomy that was placed.  The patient presents to the hospital here with fairly severe shortness of breath, it is unclear exactly how long this is going on but there is quite significant difficulty breathing.  Evidently family had called and asked for her to be transported.  The patient is not compliant with her medications, she was noted to be severely hypertensive, she was tachycardic, she was hypoxic to 60% on room air at her residence.    Home Medications Prior to Admission medications   Medication Sig Start Date End Date Taking? Authorizing Provider  amLODipine (NORVASC) 10 MG tablet TAKE ONE TABLET BY MOUTH ONCE DAILY. 01/29/21   Kerri Perches, MD  amLODipine (NORVASC) 2.5 MG tablet TAKE ONE TABLET BY MOUTH ONCE DAILY. 07/31/21   Kerri Perches, MD  gabapentin (NEURONTIN) 100 MG capsule TAKE (2) CAPSULES BY MOUTH AT BEDTIME. 05/30/21   Kerri Perches, MD  mirtazapine (REMERON) 15 MG tablet Take 1 tablet (15 mg total) by mouth at bedtime. 05/04/21   Kerri Perches, MD      Allergies    Aricept Palma Holter hcl], Calcium-containing compounds, Exelon [rivastigmine tartrate], and Exelon [rivastigmine]     Review of Systems   Review of Systems  Unable to perform ROS: Acuity of condition  Respiratory:  Positive for shortness of breath.     Physical Exam Updated Vital Signs BP (!) 192/101 (BP Location: Left Arm)   Pulse (!) 121   Temp 98.4 F (36.9 C) (Oral)   Resp (!) 46   Ht 1.524 m (5')   Wt 40.8 kg   SpO2 93%   BMI 17.58 kg/m  Physical Exam Vitals and nursing note reviewed.  Constitutional:      General: She is in acute distress.     Appearance: She is ill-appearing and toxic-appearing.  HENT:     Head: Normocephalic and atraumatic.     Mouth/Throat:     Pharynx: No oropharyngeal exudate.  Eyes:     General: No scleral icterus.       Right eye: No discharge.        Left eye: No discharge.     Conjunctiva/sclera: Conjunctivae normal.     Pupils: Pupils are equal, round, and reactive to light.  Neck:     Thyroid: No thyromegaly.     Vascular: No JVD.  Cardiovascular:     Rate and Rhythm: Regular rhythm. Tachycardia present.     Heart sounds: Normal heart sounds. No murmur heard.    No friction rub. No gallop.  Pulmonary:     Effort: Respiratory distress present.     Breath sounds: Wheezing, rhonchi and rales present.  Abdominal:     General: Bowel  sounds are normal. There is no distension.     Palpations: Abdomen is soft. There is no mass.     Tenderness: There is no abdominal tenderness.  Musculoskeletal:        General: No tenderness. Normal range of motion.     Cervical back: Normal range of motion and neck supple.     Right lower leg: No edema.     Left lower leg: No edema.  Lymphadenopathy:     Cervical: No cervical adenopathy.  Skin:    General: Skin is warm and dry.     Findings: No erythema or rash.  Neurological:     Mental Status: She is alert.     Coordination: Coordination normal.  Psychiatric:        Behavior: Behavior normal.     ED Results / Procedures / Treatments   Labs (all labs ordered are listed, but only abnormal results are  displayed) Labs Reviewed  RESP PANEL BY RT-PCR (RSV, FLU A&B, COVID)  RVPGX2 - Abnormal; Notable for the following components:      Result Value   Resp Syncytial Virus by PCR POSITIVE (*)    All other components within normal limits  LACTIC ACID, PLASMA - Abnormal; Notable for the following components:   Lactic Acid, Venous 2.6 (*)    All other components within normal limits  COMPREHENSIVE METABOLIC PANEL - Abnormal; Notable for the following components:   Glucose, Bld 169 (*)    BUN 27 (*)    Creatinine, Ser 1.07 (*)    Total Protein 8.5 (*)    GFR, Estimated 53 (*)    All other components within normal limits  CBC WITH DIFFERENTIAL/PLATELET - Abnormal; Notable for the following components:   RBC 6.14 (*)    Hemoglobin 18.6 (*)    HCT 58.1 (*)    Neutro Abs 9.2 (*)    All other components within normal limits  BLOOD GAS, ARTERIAL - Abnormal; Notable for the following components:   pH, Arterial 7.31 (*)    pCO2 arterial 59 (*)    pO2, Arterial 111 (*)    Bicarbonate 29.7 (*)    All other components within normal limits  CULTURE, BLOOD (ROUTINE X 2)  CULTURE, BLOOD (ROUTINE X 2)  PROTIME-INR  APTT  LACTIC ACID, PLASMA  URINALYSIS, W/ REFLEX TO CULTURE (INFECTION SUSPECTED)    EKG EKG Interpretation Date/Time:  Friday May 30 2023 19:57:23 EST Ventricular Rate:  121 PR Interval:  134 QRS Duration:  95 QT Interval:  333 QTC Calculation: 473 R Axis:   91  Text Interpretation: Sinus tachycardia LAE, consider biatrial enlargement Artifact in lead(s) V5 Confirmed by Eber Hong ([id number]) on 05/30/2023 8:29:11 PM  Radiology No results found.  Procedures .Critical Care  Performed by: Eber Hong, MD Authorized by: Eber Hong, MD   Critical care provider statement:    Critical care time (minutes):  45   Critical care time was exclusive of:  Separately billable procedures and treating other patients   Critical care was necessary to treat or prevent imminent or  life-threatening deterioration of the following conditions:  Respiratory failure   Critical care was time spent personally by me on the following activities:  Development of treatment plan with patient or surrogate, discussions with consultants, evaluation of patient's response to treatment, examination of patient, obtaining history from patient or surrogate, review of old charts, re-evaluation of patient's condition, pulse oximetry, ordering and review of radiographic studies, ordering and review of laboratory studies  and ordering and performing treatments and interventions   I assumed direction of critical care for this patient from another provider in my specialty: no     Care discussed with: admitting provider   Comments:           Medications Ordered in ED Medications  lactated ringers infusion ( Intravenous New Bag/Given 05/30/23 2004)  cefTRIAXone (ROCEPHIN) 2 g in sodium chloride 0.9 % 100 mL IVPB (has no administration in time range)  albuterol (PROVENTIL,VENTOLIN) solution continuous neb (10 mg Nebulization Not Given 05/30/23 2029)  albuterol (PROVENTIL) (2.5 MG/3ML) 0.083% nebulizer solution 10 mg (10 mg Nebulization Not Given 05/30/23 2029)  azithromycin (ZITHROMAX) 500 mg in sodium chloride 0.9 % 250 mL IVPB (500 mg Intravenous New Bag/Given 05/30/23 2008)  methylPREDNISolone sodium succinate (SOLU-MEDROL) 125 mg/2 mL injection 125 mg (125 mg Intravenous Given 05/30/23 2000)  albuterol (PROVENTIL) (2.5 MG/3ML) 0.083% nebulizer solution (10 mg  Given 05/30/23 2029)  LORazepam (ATIVAN) injection 0.5 mg (0.5 mg Intravenous Given 05/30/23 2104)    ED Course/ Medical Decision Making/ A&P                                 Medical Decision Making Amount and/or Complexity of Data Reviewed Labs: ordered. Radiology: ordered.  Risk Prescription drug management. Decision regarding hospitalization.    This patient presents to the ED for concern of severe shortness of breath, this involves an  extensive number of treatment options, and is a complaint that carries with it a high risk of complications and morbidity.  The differential diagnosis includes pneumothorax, pneumonia, COPD exacerbation, CHF, sepsis   Co morbidities that complicate the patient evaluation  Severe COPD, noncompliant with medications   Additional history obtained:  Additional history obtained from the record External records from outside source obtained and reviewed including [facility] admission to the hospital over the last year   Lab Tests:  I Ordered, and personally interpreted labs.  The pertinent results include: Lactic acid 2.6, positive for RSV, negative for flu and COVID, metabolic panel without significant acute findings, CBC without leukocytosis, certainly seems volume contracted with high hemoglobin.  INR 1.1.  ABG with mild acidosis, CO2 of 59   Imaging Studies ordered:  I ordered imaging studies including chest x-ray I independently visualized and interpreted imaging which showed hyperexpansion, no obvious pneumothorax or pneumonia, radiologist does not formally read the study at the time of admission  Cardiac Monitoring: / EKG:  The patient was maintained on a cardiac monitor.  I personally viewed and interpreted the cardiac monitored which showed an underlying rhythm of: Sinus tachycardia   Consultations Obtained:  I requested consultation with the hospitalist Dr. Adrian Blackwater,  and discussed lab and imaging findings as well as pertinent plan - they recommend: Admission to higher level of care   Problem List / ED Course / Critical interventions / Medication management  This patient has severe COPD and is here with acute respiratory failure.  She is hypoxic in distress and evidently has RSV, this is flaring up her severe COPD and has restricted oxygen.  She will need continuous nebulizer therapy times multiple, she received continuous nebulizers, steroids and ultimately needed some BiPAP.  She  will be admitted to higher level of care tonight.   Social Determinants of Health:  Chronic tobacco use   Test / Admission - Considered:  Admit to higher level of care  Final Clinical Impression(s) / ED Diagnoses Final diagnoses:  Acute respiratory failure with hypoxia [facility])    Rx / DC Orders ED Discharge Orders     None         Eber Hong, MD 05/30/23 2108

## 2023-05-30 NOTE — ED Triage Notes (Signed)
 Pt BIB [facility] for SOB that started yesterday but has progressively gotten worse to today. Pt hx of COPD and dementia. EMS reported 70 on RA when upon arrival. Given albuterol via blow by. Pt on non-re breather and hypertensive in triage

## 2023-05-30 NOTE — Plan of Care (Signed)

## 2023-05-31 DIAGNOSIS — J9601 Acute respiratory failure with hypoxia: Secondary | ICD-10-CM | POA: Insufficient documentation

## 2023-05-31 DIAGNOSIS — Z720 Tobacco use: Secondary | ICD-10-CM

## 2023-05-31 DIAGNOSIS — F0390 Unspecified dementia without behavioral disturbance: Secondary | ICD-10-CM | POA: Diagnosis not present

## 2023-05-31 DIAGNOSIS — J121 Respiratory syncytial virus pneumonia: Secondary | ICD-10-CM | POA: Insufficient documentation

## 2023-05-31 DIAGNOSIS — J441 Chronic obstructive pulmonary disease with (acute) exacerbation: Secondary | ICD-10-CM

## 2023-05-31 DIAGNOSIS — J9602 Acute respiratory failure with hypercapnia: Secondary | ICD-10-CM

## 2023-05-31 LAB — BASIC METABOLIC PANEL
Anion gap: 13 (ref 5–15)
BUN: 38 mg/dL — ABNORMAL HIGH (ref 8–23)
CO2: 25 mmol/L (ref 22–32)
Calcium: 9.1 mg/dL (ref 8.9–10.3)
Chloride: 101 mmol/L (ref 98–111)
Creatinine, Ser: 1.27 mg/dL — ABNORMAL HIGH (ref 0.44–1.00)
GFR, Estimated: 43 mL/min — ABNORMAL LOW (ref >60)
Glucose, Bld: 238 mg/dL — ABNORMAL HIGH (ref 70–99)
Potassium: 3.2 mmol/L — ABNORMAL LOW (ref 3.5–5.1)
Sodium: 139 mmol/L (ref 135–145)

## 2023-05-31 LAB — CBC
HCT: 49.4 % — ABNORMAL HIGH (ref 36.0–46.0)
Hemoglobin: 15.4 g/dL — ABNORMAL HIGH (ref 12.0–15.0)
MCH: 29.7 pg (ref 26.0–34.0)
MCHC: 31.2 g/dL (ref 30.0–36.0)
MCV: 95.4 fL (ref 80.0–100.0)
Platelets: 190 10*3/uL (ref 150–400)
RBC: 5.18 MIL/uL — ABNORMAL HIGH (ref 3.87–5.11)
RDW: 14.8 % (ref 11.5–15.5)
WBC: 8 10*3/uL (ref 4.0–10.5)
nRBC: 0 % (ref 0.0–0.2)

## 2023-05-31 LAB — URINALYSIS, W/ REFLEX TO CULTURE (INFECTION SUSPECTED)
Bilirubin Urine: NEGATIVE
Glucose, UA: NEGATIVE mg/dL
Ketones, ur: NEGATIVE mg/dL
Leukocytes,Ua: NEGATIVE
Nitrite: NEGATIVE
Protein, ur: >=300 mg/dL — AB
Specific Gravity, Urine: 1.018 (ref 1.005–1.030)
pH: 5 (ref 5.0–8.0)

## 2023-05-31 LAB — STREP PNEUMONIAE URINARY ANTIGEN: Strep Pneumo Urinary Antigen: NEGATIVE

## 2023-05-31 LAB — MRSA NEXT GEN BY PCR, NASAL: MRSA by PCR Next Gen: NOT DETECTED

## 2023-05-31 MED ORDER — ARFORMOTEROL TARTRATE 15 MCG/2ML IN NEBU
15.0000 ug | INHALATION_SOLUTION | Freq: Two times a day (BID) | RESPIRATORY_TRACT | Status: DC
  Administered 2023-05-31 – 2023-06-13 (×26): 15 ug via RESPIRATORY_TRACT
  Filled 2023-05-31 (×28): qty 2

## 2023-05-31 MED ORDER — BUDESONIDE 0.5 MG/2ML IN SUSP
0.5000 mg | Freq: Two times a day (BID) | RESPIRATORY_TRACT | Status: DC
  Administered 2023-05-31 – 2023-06-09 (×19): 0.5 mg via RESPIRATORY_TRACT
  Filled 2023-05-31 (×19): qty 2

## 2023-05-31 MED ORDER — POTASSIUM CHLORIDE 10 MEQ/100ML IV SOLN
10.0000 meq | INTRAVENOUS | Status: AC
  Administered 2023-05-31 (×3): 10 meq via INTRAVENOUS
  Filled 2023-05-31 (×3): qty 100

## 2023-05-31 MED ORDER — METHYLPREDNISOLONE SODIUM SUCC 125 MG IJ SOLR
60.0000 mg | Freq: Two times a day (BID) | INTRAMUSCULAR | Status: DC
  Administered 2023-05-31 – 2023-06-06 (×13): 60 mg via INTRAVENOUS
  Filled 2023-05-31 (×14): qty 2

## 2023-05-31 MED ORDER — POTASSIUM CHLORIDE IN NACL 20-0.9 MEQ/L-% IV SOLN: INTRAVENOUS | Status: DC

## 2023-05-31 NOTE — Progress Notes (Addendum)
 PROGRESS NOTE  Carla [name] ZOX:[id number] DOB: [birth year] DOA: 05/30/2023 PCP: Patient, [name]  Brief History:  78 year old female with a history of dementia, COPD, tobacco abuse, hypertension, anxiety, hyperlipidemia, and primary hyperparathyroidism presenting with 3-day history of shortness of breath.  The patient is unable to provide history at this time secondary to being on BiPAP and receiving some hypnotic medications.  History is obtained from review of the medical record and speaking with the patient's family.  At baseline, the patient is pleasantly confused and able to perform her activities of daily living.  The patient continues to smoke 2 packs/day.  Aside from shortness of breath, family states that there has not been any particular complaints.  She has had a nonproductive cough.  There is not been any fevers, vomiting, diarrhea, abdominal pain.  The patient has had a few loose stools without hematochezia or melena.  Because of worsening shortness of breath, the patient was brought to the emergency department for further evaluation and treatment.  Notably, the patient's son states that the patient does not take any of her medications as directed nor use her inhalers as directed at home. In the ED, the patient was afebrile and hemodynamically stable.  Oxygen saturation was in the 60s on room air.  The patient was placed on BiPAP.  WBC 10.5, hemoglobin 18.6, platelets 190.  Sodium 139, potassium 3.2, bicarbonate 25, serum creatinine 1.07.  LFTs were unremarkable.  Chest x-ray showed increased interstitial markings and hyperinflation.  COVID-19 PCR was negative.  RSV was positive.  Lactic acid 2.6>> 1.5.  PCT 0.42.  EKG shows sinus rhythm and nonspecific T wave changes.  VBG showed 7.3/50 select/60/27.  The patient was started on Solu-Medrol and bronchodilators.   Assessment/Plan: Acute respiratory failure with hypoxia and hypercarbia -Secondary to COPD exacerbation in the  setting of RSV infection -Wean off BiPAP as tolerated -Initial VBG 7.3/56/60/27 -Personally reviewed chest x-ray--hyperinflation, bibasilar opacities  COPD exacerbation -Start Pulmicort -Start Brovana -Continue IV Solu-Medrol -Continue DuoNebs  RSV pneumonitis -Supportive care  Tobacco abuse -Tobacco cessation discussed  Major neurocognitive disorder -Patient is at risk for hospital delirium  Hypokalemia -Replete -check mag           Family Communication:   son updated 3/8  Consultants:  none  Code Status:  FULL   DVT Prophylaxis:  [place] Lovenox   Procedures: As Listed in Progress Note Above  Antibiotics: Ceftriaxone 3/7>> Azithro 3/7>>     Subjective: Patient is somnolent on BiPAP.  Review of systems not possible secondary to dementia and sedation.  Objective: Vitals:   05/31/23 0500 05/31/23 0600 05/31/23 0700 05/31/23 0719  BP: (!) 106/53 123/61 (!) 155/82   Pulse: 93 81 83   Resp: (!) 29 (!) 26 (!) 27   Temp:    97.6 F (36.4 C)  TempSrc:    Axillary  SpO2: 95% 97% 97%   Weight:      Height:        Intake/Output Summary (Last 24 hours) at 05/31/2023 0831 Last data filed at 05/31/2023 0631 Gross per 24 hour  Intake 1580.84 ml  Output 250 ml  Net 1330.84 ml   Weight change:  Exam:  General:  Pt is somnolent, does not follows command appropriately, not in acute distress HEENT: No icterus, No thrush, No neck mass, [place]/AT Cardiovascular: RRR, S1/S2, no rubs, no gallops Respiratory: Diminished breath sounds bilateral.  Minimal basilar wheeze. Abdomen: Soft/+BS, non  tender, non distended, no guarding Extremities: No edema, No lymphangitis, No petechiae, No rashes, no synovitis   Data Reviewed: I have personally reviewed following labs and imaging studies Basic Metabolic Panel: Recent Labs  Lab 05/30/23 1945 05/31/23 0345  NA 139 139  K 5.1 3.2*  CL 101 101  CO2 27 25  GLUCOSE 169* 238*  BUN 27* 38*  CREATININE 1.07* 1.27*   CALCIUM 9.8 9.1   Liver Function Tests: Recent Labs  Lab 05/30/23 1945  AST 29  ALT 16  ALKPHOS 113  BILITOT 1.0  PROT 8.5*  ALBUMIN 3.9   No results for input(s): "LIPASE", "AMYLASE" in the last 168 hours. No results for input(s): "AMMONIA" in the last 168 hours. Coagulation Profile: Recent Labs  Lab 05/30/23 1945  INR 1.1   CBC: Recent Labs  Lab 05/30/23 1945 05/31/23 0345  WBC 10.5 8.0  NEUTROABS 9.2*  --   HGB 18.6* 15.4*  HCT 58.1* 49.4*  MCV 94.6 95.4  PLT 243 190   Cardiac Enzymes: No results for input(s): "CKTOTAL", "CKMB", "CKMBINDEX", "TROPONINI" in the last 168 hours. BNP: Invalid input(s): "POCBNP" CBG: No results for input(s): "GLUCAP" in the last 168 hours. HbA1C: No results for input(s): "HGBA1C" in the last 72 hours. Urine analysis:    Component Value Date/Time   COLORURINE AMBER (A) 05/31/2023 0607   APPEARANCEUR CLOUDY (A) 05/31/2023 0607   LABSPEC 1.018 05/31/2023 0607   PHURINE 5.0 05/31/2023 0607   GLUCOSEU NEGATIVE 05/31/2023 0607   HGBUR MODERATE (A) 05/31/2023 0607   BILIRUBINUR NEGATIVE 05/31/2023 0607   BILIRUBINUR negative 08/11/2019 1432   BILIRUBINUR small 06/02/2019 1539   KETONESUR NEGATIVE 05/31/2023 0607   PROTEINUR >=300 (A) 05/31/2023 0607   UROBILINOGEN 0.2 08/11/2019 1432   UROBILINOGEN 0.2 07/07/2013 1550   NITRITE NEGATIVE 05/31/2023 0607   LEUKOCYTESUR NEGATIVE 05/31/2023 1610   Sepsis Labs: @LABRCNTIP (procalcitonin:4,lacticidven:4) ) Recent Results (from the past 240 hours)  Resp panel by RT-PCR (RSV, Flu A&B, Covid) Anterior Nasal Swab     Status: Abnormal   Collection Time: 05/30/23  7:45 PM   Specimen: Anterior Nasal Swab  Result Value Ref Range Status   SARS Coronavirus 2 by RT PCR NEGATIVE NEGATIVE Final    Comment: (NOTE) SARS-CoV-2 target nucleic acids are NOT DETECTED.  The SARS-CoV-2 RNA is generally detectable in upper respiratory specimens during the acute phase of infection. The  lowest concentration of SARS-CoV-2 viral copies this assay can detect is 138 copies/mL. A negative result does not preclude SARS-Cov-2 infection and should not be used as the sole basis for treatment or other patient management decisions. A negative result may occur with  improper specimen collection/handling, submission of specimen other than nasopharyngeal swab, presence of viral mutation(s) within the areas targeted by this assay, and inadequate number of viral copies(<138 copies/mL). A negative result must be combined with clinical observations, patient history, and epidemiological information. The expected result is Negative.  Fact Sheet for Patients:  BloggerCourse.com  Fact Sheet for Healthcare Providers:  SeriousBroker.it  This test is no t yet approved or cleared by the [place] FDA and  has been authorized for detection and/or diagnosis of SARS-CoV-2 by FDA under an Emergency Use Authorization (EUA). This EUA will remain  in effect (meaning this test can be used) for the duration of the COVID-19 declaration under Section 564(b)(1) of the Act, 21 U.S.C.section 360bbb-3(b)(1), unless the authorization is terminated  or revoked sooner.       Influenza A by PCR NEGATIVE  NEGATIVE Final   Influenza B by PCR NEGATIVE NEGATIVE Final    Comment: (NOTE) The Xpert Xpress SARS-CoV-2/FLU/RSV plus assay is intended as an aid in the diagnosis of influenza from Nasopharyngeal swab specimens and should not be used as a sole basis for treatment. Nasal washings and aspirates are unacceptable for Xpert Xpress SARS-CoV-2/FLU/RSV testing.  Fact Sheet for Patients: BloggerCourse.com  Fact Sheet for Healthcare Providers: SeriousBroker.it  This test is not yet approved or cleared by the [place] FDA and has been authorized for detection and/or diagnosis of SARS-CoV-2 by FDA under  an Emergency Use Authorization (EUA). This EUA will remain in effect (meaning this test can be used) for the duration of the COVID-19 declaration under Section 564(b)(1) of the Act, 21 U.S.C. section 360bbb-3(b)(1), unless the authorization is terminated or revoked.     Resp Syncytial Virus by PCR POSITIVE (A) NEGATIVE Final    Comment: (NOTE) Fact Sheet for Patients: BloggerCourse.com  Fact Sheet for Healthcare Providers: SeriousBroker.it  This test is not yet approved or cleared by the [place] FDA and has been authorized for detection and/or diagnosis of SARS-CoV-2 by FDA under an Emergency Use Authorization (EUA). This EUA will remain in effect (meaning this test can be used) for the duration of the COVID-19 declaration under Section 564(b)(1) of the Act, 21 U.S.C. section 360bbb-3(b)(1), unless the authorization is terminated or revoked.  Performed at [facility], [address], [place], [place] [address]   Blood Culture (routine x 2)     Status: None (Preliminary result)   Collection Time: 05/30/23  7:45 PM   Specimen: BLOOD  Result Value Ref Range Status   Specimen Description BLOOD BLOOD LEFT ARM AC  Final   Special Requests BOTTLES DRAWN AEROBIC AND ANAEROBIC  Final   Culture   Final    NO GROWTH < 12 HOURS Performed at [facility], [address], [place], [place] [address]    Report Status PENDING  Incomplete  Blood Culture (routine x 2)     Status: None (Preliminary result)   Collection Time: 05/30/23  8:00 PM   Specimen: BLOOD  Result Value Ref Range Status   Specimen Description BLOOD BLOOD RIGHT HAND  Final   Special Requests BOTTLES DRAWN AEROBIC AND ANAEROBIC  Final   Culture   Final    NO GROWTH < 12 HOURS Performed at [facility], [address] [place], [place] [address]    Report Status PENDING  Incomplete  MRSA Next Gen by PCR, Nasal     Status: None   Collection Time: 05/30/23  9:47 PM    Specimen: Nasal Mucosa; Nasal Swab  Result Value Ref Range Status   MRSA by PCR Next Gen NOT DETECTED NOT DETECTED Final    Comment: (NOTE) The GeneXpert MRSA Assay (FDA approved for NASAL specimens only), is one component of a comprehensive MRSA colonization surveillance program. It is not intended to diagnose MRSA infection nor to guide or monitor treatment for MRSA infections. Test performance is not FDA approved in patients less than 12 years old. Performed at [facility], [address] [place], [place] [address]      Scheduled Meds:  arformoterol  15 mcg Nebulization BID   budesonide (PULMICORT) nebulizer solution  0.5 mg Nebulization BID   Chlorhexidine Gluconate Cloth  6 each Topical Daily   enoxaparin (LOVENOX) injection  30 mg Subcutaneous Q24H   gabapentin  200 mg Oral QHS   guaiFENesin  600 mg Oral BID  ipratropium-albuterol  3 mL Nebulization Q6H   methylPREDNISolone (SOLU-MEDROL) injection  60 mg Intravenous Q12H   mouth rinse  15 mL Mouth Rinse 4 times per day   Continuous Infusions:  azithromycin     cefTRIAXone (ROCEPHIN)  IV     lactated ringers 150 mL/hr at 05/31/23 0346   potassium chloride 10 mEq (05/31/23 0756)    Procedures/Studies: DG Chest Port 1 View Result Date: 05/30/2023 CLINICAL DATA:  Worsening shortness of breath. History of COPD. Sepsis workup. EXAM: PORTABLE CHEST 1 VIEW COMPARISON:  Portable chest 07/03/2022 FINDINGS: The heart is slightly enlarged. There is increased central vascular prominence. Interval new mild interstitial edema in both lung bases with trace pleural effusions. The lungs are emphysematous with no focal pulmonary consolidation. Findings consistent with mild CHF or fluid overload. The mediastinum is stable. The aorta is heavily calcified. Osteopenia and thoracic spondylosis. No new osseous finding. Multiple overlying monitor wires. IMPRESSION: 1. Findings consistent with mild CHF or fluid overload. 2. Emphysema.  No focal  consolidation. 3. Aortic atherosclerosis. Electronically Signed   By: Almira Bar M.D.   On: 05/30/2023 23:08    Catarina Hartshorn, DO  [facility]  If 7PM-7AM, please contact night-coverage [email or web address] Password [email or web address] 05/31/2023, 8:31 AM   LOS: 1 day

## 2023-05-31 NOTE — Progress Notes (Signed)
 Patient has been somnolent this morning and has not awaken enough to swallow or eat. Have performed mouth care tolerated well. Still on Bipap due to sleepiness.

## 2023-05-31 NOTE — Hospital Course (Addendum)
 Carla [name] 77 year old female with a history of dementia, COPD, tobacco abuse, hypertension, anxiety, hyperlipidemia, and primary hyperparathyroidism presenting with 3-day history of shortness of breath.  The patient is unable to provide history at this time secondary to being on BiPAP and receiving some hypnotic medications.  History is obtained from review of the medical record and speaking with the patient's family.  At baseline, the patient is pleasantly confused and able to perform her activities of daily living.  The patient continues to smoke 2 packs/day.  Aside from shortness of breath, family states that there has not been any particular complaints.  She has had a nonproductive cough.  There is not been any fevers, vomiting, diarrhea, abdominal pain.  The patient has had a few loose stools without hematochezia or melena.  Because of worsening shortness of breath, the patient was brought to the emergency department for further evaluation and treatment.  Notably, the patient's son states that the patient does not take any of her medications as directed nor use her inhalers as directed at home. In the ED, the patient was afebrile and hemodynamically stable.  Oxygen saturation was in the 60s on room air.  The patient was placed on BiPAP.  WBC 10.5, hemoglobin 18.6, platelets 190.  Sodium 139, potassium 3.2, bicarbonate 25, serum creatinine 1.07.  LFTs were unremarkable.  Chest x-ray showed increased interstitial markings and hyperinflation.  COVID-19 PCR was negative.  RSV was positive.  Lactic acid 2.6>> 1.5.  PCT 0.42.  EKG shows sinus rhythm and nonspecific T wave changes.  VBG showed 7.3/50 select/60/27.  The patient was started on Solu-Medrol and bronchodilators.

## 2023-05-31 NOTE — Plan of Care (Signed)

## 2023-05-31 NOTE — TOC CM/SW Note (Signed)
 Transition of Care [facility]) - Inpatient Brief Assessment   Patient Details  Name: Carla [name] MRN: [id number] Date of Birth: 03/24/1946  Transition of Care [facility]) CM/SW Contact:    Isabella Bowens, LCSWA Phone Number: 05/31/2023, 1:40 PM   Clinical Narrative:  Transition of Care Department [facility]) has reviewed patient and no TOC needs have been identified at this time. We will continue to monitor patient advancement through interdisciplinary progression rounds. If new patient transition needs arise, please place a TOC consult.   Transition of Care Asessment: Insurance and Status: Insurance coverage has been reviewed Patient has primary care physician: Yes Home environment has been reviewed: Single family home with spouse Prior level of function:: Family assist with needs Prior/Current Home Services: No current home services Social Drivers of Health Review: SDOH reviewed no interventions necessary Readmission risk has been reviewed: Yes Transition of care needs: no transition of care needs at this time

## 2023-06-01 DIAGNOSIS — J121 Respiratory syncytial virus pneumonia: Secondary | ICD-10-CM | POA: Diagnosis not present

## 2023-06-01 DIAGNOSIS — F0390 Unspecified dementia without behavioral disturbance: Secondary | ICD-10-CM | POA: Diagnosis not present

## 2023-06-01 DIAGNOSIS — Z720 Tobacco use: Secondary | ICD-10-CM | POA: Diagnosis not present

## 2023-06-01 DIAGNOSIS — J9621 Acute and chronic respiratory failure with hypoxia: Secondary | ICD-10-CM | POA: Diagnosis not present

## 2023-06-01 LAB — BASIC METABOLIC PANEL
Anion gap: 10 (ref 5–15)
BUN: 42 mg/dL — ABNORMAL HIGH (ref 8–23)
CO2: 25 mmol/L (ref 22–32)
Calcium: 10 mg/dL (ref 8.9–10.3)
Chloride: 109 mmol/L (ref 98–111)
Creatinine, Ser: 1.15 mg/dL — ABNORMAL HIGH (ref 0.44–1.00)
GFR, Estimated: 49 mL/min — ABNORMAL LOW (ref >60)
Glucose, Bld: 132 mg/dL — ABNORMAL HIGH (ref 70–99)
Potassium: 4.1 mmol/L (ref 3.5–5.1)
Sodium: 144 mmol/L (ref 135–145)

## 2023-06-01 LAB — BLOOD GAS, ARTERIAL
Acid-Base Excess: 1.1 mmol/L (ref 0.0–2.0)
Bicarbonate: 28.3 mmol/L — ABNORMAL HIGH (ref 20.0–28.0)
Drawn by: 22223
O2 Saturation: 98.7 %
Patient temperature: 37
pCO2 arterial: 55 mmHg — ABNORMAL HIGH (ref 32–48)
pH, Arterial: 7.32 — ABNORMAL LOW (ref 7.35–7.45)
pO2, Arterial: 89 mmHg (ref 83–108)

## 2023-06-01 LAB — PHOSPHORUS: Phosphorus: 2.7 mg/dL (ref 2.5–4.6)

## 2023-06-01 LAB — MAGNESIUM: Magnesium: 2.7 mg/dL — ABNORMAL HIGH (ref 1.7–2.4)

## 2023-06-01 MED ORDER — LACTATED RINGERS IV SOLN: INTRAVENOUS | Status: AC

## 2023-06-01 MED ORDER — ORAL CARE MOUTH RINSE: 15.0000 mL | OROMUCOSAL | Status: DC | PRN

## 2023-06-01 MED ORDER — ORAL CARE MOUTH RINSE: 15.0000 mL | OROMUCOSAL | Status: DC

## 2023-06-01 NOTE — Progress Notes (Signed)
 PROGRESS NOTE  Carla [name]:[id number] DOB: 03-07-1946 DOA: 05/30/2023 PCP: Patient, [name]  Brief History:  78 year old female with a history of dementia, COPD, tobacco abuse, hypertension, anxiety, hyperlipidemia, and primary hyperparathyroidism presenting with 3-day history of shortness of breath.  The patient is unable to provide history at this time secondary to being on BiPAP and receiving some hypnotic medications.  History is obtained from review of the medical record and speaking with the patient's family.  At baseline, the patient is pleasantly confused and able to perform her activities of daily living.  The patient continues to smoke 2 packs/day.  Aside from shortness of breath, family states that there has not been any particular complaints.  She has had a nonproductive cough.  There is not been any fevers, vomiting, diarrhea, abdominal pain.  The patient has had a few loose stools without hematochezia or melena.  Because of worsening shortness of breath, the patient was brought to the emergency department for further evaluation and treatment.  Notably, the patient's son states that the patient does not take any of her medications as directed nor use her inhalers as directed at home. In the ED, the patient was afebrile and hemodynamically stable.  Oxygen saturation was in the 60s on room air.  The patient was placed on BiPAP.  WBC 10.5, hemoglobin 18.6, platelets 190.  Sodium 139, potassium 3.2, bicarbonate 25, serum creatinine 1.07.  LFTs were unremarkable.  Chest x-ray showed increased interstitial markings and hyperinflation.  COVID-19 PCR was negative.  RSV was positive.  Lactic acid 2.6>> 1.5.  PCT 0.42.  EKG shows sinus rhythm and nonspecific T wave changes.  VBG showed 7.3/50 select/60/27.  The patient was started on Solu-Medrol and bronchodilators.   Assessment/Plan: Acute respiratory failure with hypoxia and hypercarbia -Secondary to COPD exacerbation in the  setting of RSV infection -Wean off BiPAP as tolerated -now on 6-8L -expect that she may continue to need prn BiPAP for increase WOB -Initial VBG 7.3/56/60/27 -Personally reviewed chest x-ray--hyperinflation, bibasilar opacities   COPD exacerbation -Started Pulmicort -Started Brovana -Continue IV Solu-Medrol -Continue DuoNebs -PCT 0.42   RSV pneumonitis -Supportive care   Tobacco abuse -Tobacco cessation discussed   Major neurocognitive disorder -Patient is at risk for hospital delirium   Hypokalemia -Replete -check mag 2.7                     Family Communication:   son updated 3/9   Consultants:  none   Code Status:  FULL    DVT Prophylaxis:  [place] Lovenox     Procedures: As Listed in Progress Note Above   Antibiotics: Ceftriaxone 3/7>> Azithro 3/7>>        Subjective: Pt is more awake.  Occasionally answers question.  ROS limited.  Pt able to state name  Objective: Vitals:   06/01/23 1400 06/01/23 1405 06/01/23 1442 06/01/23 1532  BP: (!) 161/68     Pulse: 98 97    Resp: (!) 29 (!) 25    Temp:    98.6 F (37 C)  TempSrc:    Oral  SpO2: 98% 92% 94%   Weight:      Height:        Intake/Output Summary (Last 24 hours) at 06/01/2023 1651 Last data filed at 06/01/2023 1500 Gross per 24 hour  Intake 2089.9 ml  Output 650 ml  Net 1439.9 ml   Weight change:  Exam:  General:  Pt is alert, does not follow commands appropriately, not in acute distress HEENT: No icterus, No thrush, No neck mass, [place]/AT Cardiovascular: RRR, S1/S2, no rubs, no gallops Respiratory: bibasilar rales.  Diminished BS.  Bibasilar wheeze Abdomen: Soft/+BS, non tender, non distended, no guarding Extremities: No edema, No lymphangitis, No petechiae, No rashes, no synovitis   Data Reviewed: I have personally reviewed following labs and imaging studies Basic Metabolic Panel: Recent Labs  Lab 05/30/23 1945 05/31/23 0345 06/01/23 0604  NA 139 139 144  K 5.1 3.2* 4.1   CL 101 101 109  CO2 27 25 25   GLUCOSE 169* 238* 132*  BUN 27* 38* 42*  CREATININE 1.07* 1.27* 1.15*  CALCIUM 9.8 9.1 10.0  MG  --   --  2.7*  PHOS  --   --  2.7   Liver Function Tests: Recent Labs  Lab 05/30/23 1945  AST 29  ALT 16  ALKPHOS 113  BILITOT 1.0  PROT 8.5*  ALBUMIN 3.9   No results for input(s): "LIPASE", "AMYLASE" in the last 168 hours. No results for input(s): "AMMONIA" in the last 168 hours. Coagulation Profile: Recent Labs  Lab 05/30/23 1945  INR 1.1   CBC: Recent Labs  Lab 05/30/23 1945 05/31/23 0345  WBC 10.5 8.0  NEUTROABS 9.2*  --   HGB 18.6* 15.4*  HCT 58.1* 49.4*  MCV 94.6 95.4  PLT 243 190   Cardiac Enzymes: No results for input(s): "CKTOTAL", "CKMB", "CKMBINDEX", "TROPONINI" in the last 168 hours. BNP: Invalid input(s): "POCBNP" CBG: No results for input(s): "GLUCAP" in the last 168 hours. HbA1C: No results for input(s): "HGBA1C" in the last 72 hours. Urine analysis:    Component Value Date/Time   COLORURINE AMBER (A) 05/31/2023 0607   APPEARANCEUR CLOUDY (A) 05/31/2023 0607   LABSPEC 1.018 05/31/2023 0607   PHURINE 5.0 05/31/2023 0607   GLUCOSEU NEGATIVE 05/31/2023 0607   HGBUR MODERATE (A) 05/31/2023 0607   BILIRUBINUR NEGATIVE 05/31/2023 0607   BILIRUBINUR negative 08/11/2019 1432   BILIRUBINUR small 06/02/2019 1539   KETONESUR NEGATIVE 05/31/2023 0607   PROTEINUR >=300 (A) 05/31/2023 0607   UROBILINOGEN 0.2 08/11/2019 1432   UROBILINOGEN 0.2 07/07/2013 1550   NITRITE NEGATIVE 05/31/2023 0607   LEUKOCYTESUR NEGATIVE 05/31/2023 2956   Sepsis Labs: @LABRCNTIP (procalcitonin:4,lacticidven:4) ) Recent Results (from the past 240 hours)  Resp panel by RT-PCR (RSV, Flu A&B, Covid) Anterior Nasal Swab     Status: Abnormal   Collection Time: 05/30/23  7:45 PM   Specimen: Anterior Nasal Swab  Result Value Ref Range Status   SARS Coronavirus 2 by RT PCR NEGATIVE NEGATIVE Final    Comment: (NOTE) SARS-CoV-2 target nucleic  acids are NOT DETECTED.  The SARS-CoV-2 RNA is generally detectable in upper respiratory specimens during the acute phase of infection. The lowest concentration of SARS-CoV-2 viral copies this assay can detect is 138 copies/mL. A negative result does not preclude SARS-Cov-2 infection and should not be used as the sole basis for treatment or other patient management decisions. A negative result may occur with  improper specimen collection/handling, submission of specimen other than nasopharyngeal swab, presence of viral mutation(s) within the areas targeted by this assay, and inadequate number of viral copies(<138 copies/mL). A negative result must be combined with clinical observations, patient history, and epidemiological information. The expected result is Negative.  Fact Sheet for Patients:  BloggerCourse.com  Fact Sheet for Healthcare Providers:  SeriousBroker.it  This test is no t yet approved or cleared by the [place] FDA  and  has been authorized for detection and/or diagnosis of SARS-CoV-2 by FDA under an Emergency Use Authorization (EUA). This EUA will remain  in effect (meaning this test can be used) for the duration of the COVID-19 declaration under Section 564(b)(1) of the Act, 21 U.S.C.section 360bbb-3(b)(1), unless the authorization is terminated  or revoked sooner.       Influenza A by PCR NEGATIVE NEGATIVE Final   Influenza B by PCR NEGATIVE NEGATIVE Final    Comment: (NOTE) The Xpert Xpress SARS-CoV-2/FLU/RSV plus assay is intended as an aid in the diagnosis of influenza from Nasopharyngeal swab specimens and should not be used as a sole basis for treatment. Nasal washings and aspirates are unacceptable for Xpert Xpress SARS-CoV-2/FLU/RSV testing.  Fact Sheet for Patients: BloggerCourse.com  Fact Sheet for Healthcare Providers: SeriousBroker.it  This  test is not yet approved or cleared by the [place] FDA and has been authorized for detection and/or diagnosis of SARS-CoV-2 by FDA under an Emergency Use Authorization (EUA). This EUA will remain in effect (meaning this test can be used) for the duration of the COVID-19 declaration under Section 564(b)(1) of the Act, 21 U.S.C. section 360bbb-3(b)(1), unless the authorization is terminated or revoked.     Resp Syncytial Virus by PCR POSITIVE (A) NEGATIVE Final    Comment: (NOTE) Fact Sheet for Patients: BloggerCourse.com  Fact Sheet for Healthcare Providers: SeriousBroker.it  This test is not yet approved or cleared by the [place] FDA and has been authorized for detection and/or diagnosis of SARS-CoV-2 by FDA under an Emergency Use Authorization (EUA). This EUA will remain in effect (meaning this test can be used) for the duration of the COVID-19 declaration under Section 564(b)(1) of the Act, 21 U.S.C. section 360bbb-3(b)(1), unless the authorization is terminated or revoked.  Performed at [facility], [address], [place], [place] [address]   Blood Culture (routine x 2)     Status: None (Preliminary result)   Collection Time: 05/30/23  7:45 PM   Specimen: BLOOD  Result Value Ref Range Status   Specimen Description BLOOD BLOOD LEFT ARM AC  Final   Special Requests BOTTLES DRAWN AEROBIC AND ANAEROBIC  Final   Culture   Final    NO GROWTH 2 DAYS Performed at [facility], [address] [place], [place] [address]    Report Status PENDING  Incomplete  Blood Culture (routine x 2)     Status: None (Preliminary result)   Collection Time: 05/30/23  8:00 PM   Specimen: BLOOD  Result Value Ref Range Status   Specimen Description BLOOD BLOOD RIGHT HAND  Final   Special Requests BOTTLES DRAWN AEROBIC AND ANAEROBIC  Final   Culture   Final    NO GROWTH 2 DAYS Performed at [facility], [address], [place],  [place] [address]    Report Status PENDING  Incomplete  MRSA Next Gen by PCR, Nasal     Status: None   Collection Time: 05/30/23  9:47 PM   Specimen: Nasal Mucosa; Nasal Swab  Result Value Ref Range Status   MRSA by PCR Next Gen NOT DETECTED NOT DETECTED Final    Comment: (NOTE) The GeneXpert MRSA Assay (FDA approved for NASAL specimens only), is one component of a comprehensive MRSA colonization surveillance program. It is not intended to diagnose MRSA infection nor to guide or monitor treatment for MRSA infections. Test performance is not FDA approved in patients less than 81 years old. Performed at [facility], [address],  [place], [place] [address]      Scheduled Meds:  arformoterol  15 mcg Nebulization BID   budesonide (PULMICORT) nebulizer solution  0.5 mg Nebulization BID   Chlorhexidine Gluconate Cloth  6 each Topical Daily   enoxaparin (LOVENOX) injection  30 mg Subcutaneous Q24H   ipratropium-albuterol  3 mL Nebulization Q6H   methylPREDNISolone (SOLU-MEDROL) injection  60 mg Intravenous Q12H   mouth rinse  15 mL Mouth Rinse 4 times per day   Continuous Infusions:  azithromycin 500 mg (05/31/23 2048)   cefTRIAXone (ROCEPHIN)  IV 2 g (05/31/23 2004)   lactated ringers 75 mL/hr at 06/01/23 1627    Procedures/Studies: DG Chest Port 1 View Result Date: 05/30/2023 CLINICAL DATA:  Worsening shortness of breath. History of COPD. Sepsis workup. EXAM: PORTABLE CHEST 1 VIEW COMPARISON:  Portable chest 07/03/2022 FINDINGS: The heart is slightly enlarged. There is increased central vascular prominence. Interval new mild interstitial edema in both lung bases with trace pleural effusions. The lungs are emphysematous with no focal pulmonary consolidation. Findings consistent with mild CHF or fluid overload. The mediastinum is stable. The aorta is heavily calcified. Osteopenia and thoracic spondylosis. No new osseous finding. Multiple overlying monitor wires. IMPRESSION: 1. Findings consistent  with mild CHF or fluid overload. 2. Emphysema.  No focal consolidation. 3. Aortic atherosclerosis. Electronically Signed   By: Almira Bar M.D.   On: 05/30/2023 23:08    Catarina Hartshorn, DO  [facility]  If 7PM-7AM, please contact night-coverage [email or web address] Password [email or web address] 06/01/2023, 4:51 PM   LOS: 2 days

## 2023-06-01 NOTE — Plan of Care (Signed)
  Problem: Education: Goal: Knowledge of General Education information will improve Description: Including pain rating scale, medication(s)/side effects and non-pharmacologic comfort measures Outcome: Progressing   Problem: Health Behavior/Discharge Planning: Goal: Ability to manage health-related needs will improve Outcome: Progressing   Problem: Clinical Measurements: Goal: Ability to maintain clinical measurements within normal limits will improve Outcome: Progressing Goal: Will remain free from infection Outcome: Progressing Goal: Diagnostic test results will improve Outcome: Progressing Goal: Respiratory complications will improve Outcome: Progressing Goal: Cardiovascular complication will be avoided Outcome: Progressing   Problem: Activity: Goal: Risk for activity intolerance will decrease Outcome: Progressing   Problem: Nutrition: Goal: Adequate nutrition will be maintained Outcome: Progressing   Problem: Coping: Goal: Level of anxiety will decrease Outcome: Progressing   Problem: Elimination: Goal: Will not experience complications related to bowel motility Outcome: Progressing Goal: Will not experience complications related to urinary retention Outcome: Progressing   Problem: Pain Managment: Goal: General experience of comfort will improve and/or be controlled Outcome: Progressing   Problem: Safety: Goal: Ability to remain free from injury will improve Outcome: Progressing   Problem: Skin Integrity: Goal: Risk for impaired skin integrity will decrease Outcome: Progressing   Problem: Education: Goal: Knowledge of disease or condition will improve Outcome: Progressing Goal: Knowledge of the prescribed therapeutic regimen will improve Outcome: Progressing Goal: Individualized Educational Video(s) Outcome: Progressing   Problem: Activity: Goal: Ability to tolerate increased activity will improve Outcome: Progressing Goal: Will verbalize the  importance of balancing activity with adequate rest periods Outcome: Progressing   Problem: Respiratory: Goal: Ability to maintain a clear airway will improve Outcome: Progressing Goal: Levels of oxygenation will improve Outcome: Progressing Goal: Ability to maintain adequate ventilation will improve Outcome: Progressing   Problem: Activity: Goal: Ability to tolerate increased activity will improve Outcome: Progressing   Problem: Clinical Measurements: Goal: Ability to maintain a body temperature in the normal range will improve Outcome: Progressing   Problem: Respiratory: Goal: Ability to maintain adequate ventilation will improve Outcome: Progressing Goal: Ability to maintain a clear airway will improve Outcome: Progressing   Problem: Safety: Goal: Non-violent Restraint(s) Outcome: Progressing

## 2023-06-02 ENCOUNTER — Inpatient Hospital Stay (HOSPITAL_COMMUNITY)

## 2023-06-02 ENCOUNTER — Other Ambulatory Visit (HOSPITAL_COMMUNITY): Payer: Self-pay | Admitting: *Deleted

## 2023-06-02 DIAGNOSIS — R0989 Other specified symptoms and signs involving the circulatory and respiratory systems: Secondary | ICD-10-CM | POA: Diagnosis not present

## 2023-06-02 DIAGNOSIS — J121 Respiratory syncytial virus pneumonia: Secondary | ICD-10-CM | POA: Diagnosis not present

## 2023-06-02 DIAGNOSIS — I509 Heart failure, unspecified: Secondary | ICD-10-CM

## 2023-06-02 DIAGNOSIS — J441 Chronic obstructive pulmonary disease with (acute) exacerbation: Secondary | ICD-10-CM | POA: Diagnosis not present

## 2023-06-02 DIAGNOSIS — J9621 Acute and chronic respiratory failure with hypoxia: Secondary | ICD-10-CM | POA: Diagnosis not present

## 2023-06-02 LAB — BASIC METABOLIC PANEL
Anion gap: 8 (ref 5–15)
BUN: 31 mg/dL — ABNORMAL HIGH (ref 8–23)
CO2: 30 mmol/L (ref 22–32)
Calcium: 9.8 mg/dL (ref 8.9–10.3)
Chloride: 107 mmol/L (ref 98–111)
Creatinine, Ser: 0.8 mg/dL (ref 0.44–1.00)
GFR, Estimated: >60 mL/min (ref >60)
Glucose, Bld: 195 mg/dL — ABNORMAL HIGH (ref 70–99)
Potassium: 3.7 mmol/L (ref 3.5–5.1)
Sodium: 145 mmol/L (ref 135–145)

## 2023-06-02 LAB — ECHOCARDIOGRAM COMPLETE
AR max vel: 2.35 cm2
AV Area VTI: 2.36 cm2
AV Area mean vel: 2.28 cm2
AV Mean grad: 5 mmHg
AV Peak grad: 10.1 mmHg
Ao pk vel: 1.59 m/s
Area-P 1/2: 4.15 cm2
Height: 60 in
P 1/2 time: 281 ms
S' Lateral: 2.6 cm
Weight: 1358.03 [oz_av]

## 2023-06-02 LAB — HEMOGLOBIN A1C
Hgb A1c MFr Bld: 6 % — ABNORMAL HIGH (ref 4.8–5.6)
Mean Plasma Glucose: 125.5 mg/dL

## 2023-06-02 LAB — BLOOD GAS, VENOUS
Acid-Base Excess: 13.6 mmol/L — ABNORMAL HIGH (ref 0.0–2.0)
Bicarbonate: 40.3 mmol/L — ABNORMAL HIGH (ref 20.0–28.0)
Drawn by: 442
O2 Saturation: 72.3 %
Patient temperature: 36.5
pCO2, Ven: 57 mmHg (ref 44–60)
pH, Ven: 7.46 — ABNORMAL HIGH (ref 7.25–7.43)
pO2, Ven: 38 mmHg (ref 32–45)

## 2023-06-02 LAB — BRAIN NATRIURETIC PEPTIDE: B Natriuretic Peptide: 570 pg/mL — ABNORMAL HIGH (ref 0.0–100.0)

## 2023-06-02 LAB — PHOSPHORUS: Phosphorus: 1.6 mg/dL — ABNORMAL LOW (ref 2.5–4.6)

## 2023-06-02 LAB — CBC
HCT: 46.3 % — ABNORMAL HIGH (ref 36.0–46.0)
Hemoglobin: 14.4 g/dL (ref 12.0–15.0)
MCH: 30 pg (ref 26.0–34.0)
MCHC: 31.1 g/dL (ref 30.0–36.0)
MCV: 96.5 fL (ref 80.0–100.0)
Platelets: 228 10*3/uL (ref 150–400)
RBC: 4.8 MIL/uL (ref 3.87–5.11)
RDW: 14.9 % (ref 11.5–15.5)
WBC: 19.8 10*3/uL — ABNORMAL HIGH (ref 4.0–10.5)
nRBC: 0 % (ref 0.0–0.2)

## 2023-06-02 LAB — MAGNESIUM: Magnesium: 2.3 mg/dL (ref 1.7–2.4)

## 2023-06-02 MED ORDER — FUROSEMIDE 10 MG/ML IJ SOLN
INTRAMUSCULAR | Status: AC
  Filled 2023-06-02: qty 2

## 2023-06-02 MED ORDER — MORPHINE SULFATE (PF) 2 MG/ML IV SOLN
1.0000 mg | Freq: Once | INTRAVENOUS | Status: AC | PRN
  Administered 2023-06-02: 1 mg via INTRAVENOUS

## 2023-06-02 MED ORDER — HYDRALAZINE HCL 20 MG/ML IJ SOLN
10.0000 mg | Freq: Four times a day (QID) | INTRAMUSCULAR | Status: DC | PRN
  Administered 2023-06-02 – 2023-06-05 (×8): 10 mg via INTRAVENOUS
  Filled 2023-06-02 (×9): qty 1

## 2023-06-02 MED ORDER — MORPHINE SULFATE (PF) 2 MG/ML IV SOLN
INTRAVENOUS | Status: AC
  Filled 2023-06-02: qty 1

## 2023-06-02 MED ORDER — POTASSIUM PHOSPHATES 15 MMOLE/5ML IV SOLN
30.0000 mmol | Freq: Once | INTRAVENOUS | Status: AC
  Administered 2023-06-02: 30 mmol via INTRAVENOUS
  Filled 2023-06-02: qty 10

## 2023-06-02 MED ORDER — FUROSEMIDE 10 MG/ML IJ SOLN
20.0000 mg | Freq: Once | INTRAMUSCULAR | Status: AC
  Administered 2023-06-02: 20 mg via INTRAVENOUS

## 2023-06-02 NOTE — Progress Notes (Addendum)
 PROGRESS NOTE  Carla [name] BJY:[id number] DOB: Dec 23, 1945 DOA: 05/30/2023 PCP: Patient, [name]  Brief History:  78 year old female with a history of dementia, COPD, tobacco abuse, hypertension, anxiety, hyperlipidemia, and primary hyperparathyroidism presenting with 3-day history of shortness of breath.  The patient is unable to provide history at this time secondary to being on BiPAP and receiving some hypnotic medications.  History is obtained from review of the medical record and speaking with the patient's family.  At baseline, the patient is pleasantly confused and able to perform her activities of daily living.  The patient continues to smoke 2 packs/day.  Aside from shortness of breath, family states that there has not been any particular complaints.  She has had a nonproductive cough.  There is not been any fevers, vomiting, diarrhea, abdominal pain.  The patient has had a few loose stools without hematochezia or melena.  Because of worsening shortness of breath, the patient was brought to the emergency department for further evaluation and treatment.  Notably, the patient's son states that the patient does not take any of her medications as directed nor use her inhalers as directed at home. In the ED, the patient was afebrile and hemodynamically stable.  Oxygen saturation was in the 60s on room air.  The patient was placed on BiPAP.  WBC 10.5, hemoglobin 18.6, platelets 190.  Sodium 139, potassium 3.2, bicarbonate 25, serum creatinine 1.07.  LFTs were unremarkable.  Chest x-ray showed increased interstitial markings and hyperinflation.  COVID-19 PCR was negative.  RSV was positive.  Lactic acid 2.6>> 1.5.  PCT 0.42.  EKG shows sinus rhythm and nonspecific T wave changes.  VBG showed 7.3/50 select/60/27.  The patient was started on Solu-Medrol and bronchodilators.   Assessment/Plan: Acute respiratory failure with hypoxia and hypercarbia -Secondary to COPD exacerbation in the  setting of RSV infection -Wean off BiPAP as tolerated -weaned to 6-8L HFNC on 06/01/23 -06/02/23 @0530 --increase WOB>>placed back on BiPAP -expect that she may continue to need prn BiPAP for increase WOB -Initial VBG 7.3/56/60/27 -3/10 Personally reviewed chest x-ray--hyperinflation, slight increase interstitial markings -06/02/23--updated son--FULL CODE for now--I discussed concerns regarding her dementia, frailty, poor baseline pulm function if she required intubation/MV>>he will discuss with family regarding wishes -06/02/23--ativan 0.5mg , morphine 1mg  given; lasix 20 mg IV, hydralazine 10 mg; now on BiPAP   COPD exacerbation -Continue Pulmicort -Continue Brovana -Continue IV Solu-Medrol -Continue DuoNebs -PCT 0.42   RSV pneumonitis -Supportive care   Tobacco abuse -Tobacco cessation discussed   Major neurocognitive disorder -Patient is at risk for hospital delirium and clinical decompensation   Hypokalemia -Replete -check mag 2.7  Hypophosphatemia -replete         Family Communication:   son updated 3/10  Consultants:  none  Code Status:  FULL   DVT Prophylaxis:  [place] Lovenox   Procedures: As Listed in Progress Note Above  Antibiotics: Ceftriaxone 3/7>> Azithro 3/7>>     Subjective: Pt grimaces to protopathic stimuli.  ROS not possible.  On BiPAP  Objective: Vitals:   06/02/23 0600 06/02/23 0630 06/02/23 0640 06/02/23 0700  BP: (!) 194/82 (!) 178/75  (!) 179/105  Pulse: (!) 154 (!) 140  (!) 134  Resp: (!) 28 (!) 24  (!) 37  Temp:      TempSrc:      SpO2: (!) 89% 100% 97% 100%  Weight:      Height:        Intake/Output Summary (  Last 24 hours) at 06/02/2023 0733 Last data filed at 06/02/2023 0533 Gross per 24 hour  Intake 2208.86 ml  Output 1000 ml  Net 1208.86 ml   Weight change:  Exam:  General:  Pt is alert, follows commands appropriately, not in acute distress HEENT: No icterus, No thrush, No neck mass, /AT Cardiovascular: RRR,  S1/S2, no rubs, no gallops Respiratory: bilateral crackles.  Diminished BS Abdomen: Soft/+BS, non tender, non distended, no guarding Extremities: No edema, No lymphangitis, No petechiae, No rashes, no synovitis   Data Reviewed: I have personally reviewed following labs and imaging studies Basic Metabolic Panel: Recent Labs  Lab 05/30/23 1945 05/31/23 0345 06/01/23 0604 06/02/23 0436  NA 139 139 144 145  K 5.1 3.2* 4.1 3.7  CL 101 101 109 107  CO2 27 25 25 30   GLUCOSE 169* 238* 132* 195*  BUN 27* 38* 42* 31*  CREATININE 1.07* 1.27* 1.15* 0.80  CALCIUM 9.8 9.1 10.0 9.8  MG  --   --  2.7* 2.3  PHOS  --   --  2.7 1.6*   Liver Function Tests: Recent Labs  Lab 05/30/23 1945  AST 29  ALT 16  ALKPHOS 113  BILITOT 1.0  PROT 8.5*  ALBUMIN 3.9   No results for input(s): "LIPASE", "AMYLASE" in the last 168 hours. No results for input(s): "AMMONIA" in the last 168 hours. Coagulation Profile: Recent Labs  Lab 05/30/23 1945  INR 1.1   CBC: Recent Labs  Lab 05/30/23 1945 05/31/23 0345  WBC 10.5 8.0  NEUTROABS 9.2*  --   HGB 18.6* 15.4*  HCT 58.1* 49.4*  MCV 94.6 95.4  PLT 243 190   Cardiac Enzymes: No results for input(s): "CKTOTAL", "CKMB", "CKMBINDEX", "TROPONINI" in the last 168 hours. BNP: Invalid input(s): "POCBNP" CBG: No results for input(s): "GLUCAP" in the last 168 hours. HbA1C: No results for input(s): "HGBA1C" in the last 72 hours. Urine analysis:    Component Value Date/Time   COLORURINE AMBER (A) 05/31/2023 0607   APPEARANCEUR CLOUDY (A) 05/31/2023 0607   LABSPEC 1.018 05/31/2023 0607   PHURINE 5.0 05/31/2023 0607   GLUCOSEU NEGATIVE 05/31/2023 0607   HGBUR MODERATE (A) 05/31/2023 0607   BILIRUBINUR NEGATIVE 05/31/2023 0607   BILIRUBINUR negative 08/11/2019 1432   BILIRUBINUR small 06/02/2019 1539   KETONESUR NEGATIVE 05/31/2023 0607   PROTEINUR >=300 (A) 05/31/2023 0607   UROBILINOGEN 0.2 08/11/2019 1432   UROBILINOGEN 0.2 07/07/2013 1550    NITRITE NEGATIVE 05/31/2023 0607   LEUKOCYTESUR NEGATIVE 05/31/2023 1610   Sepsis Labs: @LABRCNTIP (procalcitonin:4,lacticidven:4) ) Recent Results (from the past 240 hours)  Resp panel by RT-PCR (RSV, Flu A&B, Covid) Anterior Nasal Swab     Status: Abnormal   Collection Time: 05/30/23  7:45 PM   Specimen: Anterior Nasal Swab  Result Value Ref Range Status   SARS Coronavirus 2 by RT PCR NEGATIVE NEGATIVE Final    Comment: (NOTE) SARS-CoV-2 target nucleic acids are NOT DETECTED.  The SARS-CoV-2 RNA is generally detectable in upper respiratory specimens during the acute phase of infection. The lowest concentration of SARS-CoV-2 viral copies this assay can detect is 138 copies/mL. A negative result does not preclude SARS-Cov-2 infection and should not be used as the sole basis for treatment or other patient management decisions. A negative result may occur with  improper specimen collection/handling, submission of specimen other than nasopharyngeal swab, presence of viral mutation(s) within the areas targeted by this assay, and inadequate number of viral copies(<138 copies/mL). A negative result must be  combined with clinical observations, patient history, and epidemiological information. The expected result is Negative.  Fact Sheet for Patients:  BloggerCourse.com  Fact Sheet for Healthcare Providers:  SeriousBroker.it  This test is no t yet approved or cleared by the [place] FDA and  has been authorized for detection and/or diagnosis of SARS-CoV-2 by FDA under an Emergency Use Authorization (EUA). This EUA will remain  in effect (meaning this test can be used) for the duration of the COVID-19 declaration under Section 564(b)(1) of the Act, 21 U.S.C.section 360bbb-3(b)(1), unless the authorization is terminated  or revoked sooner.       Influenza A by PCR NEGATIVE NEGATIVE Final   Influenza B by PCR NEGATIVE NEGATIVE  Final    Comment: (NOTE) The Xpert Xpress SARS-CoV-2/FLU/RSV plus assay is intended as an aid in the diagnosis of influenza from Nasopharyngeal swab specimens and should not be used as a sole basis for treatment. Nasal washings and aspirates are unacceptable for Xpert Xpress SARS-CoV-2/FLU/RSV testing.  Fact Sheet for Patients: BloggerCourse.com  Fact Sheet for Healthcare Providers: SeriousBroker.it  This test is not yet approved or cleared by the [place] FDA and has been authorized for detection and/or diagnosis of SARS-CoV-2 by FDA under an Emergency Use Authorization (EUA). This EUA will remain in effect (meaning this test can be used) for the duration of the COVID-19 declaration under Section 564(b)(1) of the Act, 21 U.S.C. section 360bbb-3(b)(1), unless the authorization is terminated or revoked.     Resp Syncytial Virus by PCR POSITIVE (A) NEGATIVE Final    Comment: (NOTE) Fact Sheet for Patients: BloggerCourse.com  Fact Sheet for Healthcare Providers: SeriousBroker.it  This test is not yet approved or cleared by the [place] FDA and has been authorized for detection and/or diagnosis of SARS-CoV-2 by FDA under an Emergency Use Authorization (EUA). This EUA will remain in effect (meaning this test can be used) for the duration of the COVID-19 declaration under Section 564(b)(1) of the Act, 21 U.S.C. section 360bbb-3(b)(1), unless the authorization is terminated or revoked.  Performed at [facility], [address] [place], [place] [address]   Blood Culture (routine x 2)     Status: None (Preliminary result)   Collection Time: 05/30/23  7:45 PM   Specimen: BLOOD  Result Value Ref Range Status   Specimen Description BLOOD BLOOD LEFT ARM AC  Final   Special Requests BOTTLES DRAWN AEROBIC AND ANAEROBIC  Final   Culture   Final    NO GROWTH 3 DAYS Performed at  [facility], [address] [place], [place] [address]    Report Status PENDING  Incomplete  Blood Culture (routine x 2)     Status: None (Preliminary result)   Collection Time: 05/30/23  8:00 PM   Specimen: BLOOD  Result Value Ref Range Status   Specimen Description BLOOD BLOOD RIGHT HAND  Final   Special Requests BOTTLES DRAWN AEROBIC AND ANAEROBIC  Final   Culture   Final    NO GROWTH 3 DAYS Performed at [facility], [address] [place], [place] [address]    Report Status PENDING  Incomplete  MRSA Next Gen by PCR, Nasal     Status: None   Collection Time: 05/30/23  9:47 PM   Specimen: Nasal Mucosa; Nasal Swab  Result Value Ref Range Status   MRSA by PCR Next Gen NOT DETECTED NOT DETECTED Final    Comment: (NOTE) The GeneXpert MRSA Assay (FDA approved for NASAL specimens only), is one component of  a comprehensive MRSA colonization surveillance program. It is not intended to diagnose MRSA infection nor to guide or monitor treatment for MRSA infections. Test performance is not FDA approved in patients less than 34 years old. Performed at [facility], [address], [place], [place] [address]      Scheduled Meds:  arformoterol  15 mcg Nebulization BID   budesonide (PULMICORT) nebulizer solution  0.5 mg Nebulization BID   Chlorhexidine Gluconate Cloth  6 each Topical Daily   enoxaparin (LOVENOX) injection  30 mg Subcutaneous Q24H   ipratropium-albuterol  3 mL Nebulization Q6H   methylPREDNISolone (SOLU-MEDROL) injection  60 mg Intravenous Q12H   mouth rinse  15 mL Mouth Rinse 4 times per day   Continuous Infusions:  azithromycin 500 mg (06/01/23 2130)   cefTRIAXone (ROCEPHIN)  IV 2 g (06/01/23 2037)   lactated ringers Stopped (06/02/23 0530)    Procedures/Studies: DG Chest Port 1 View Result Date: 06/02/2023 CLINICAL DATA:  Shortness of breath and COPD. EXAM: PORTABLE CHEST 1 VIEW COMPARISON:  05/30/2023 FINDINGS: Stable cardiomediastinal contours. Aortic  atherosclerosis. Lungs appear hyperinflated. Coarsened interstitial markings. Pulmonary vascular congestion without frank edema. No airspace consolidation, atelectasis or pneumothorax. Visualized osseous structures are intact. IMPRESSION: 1. Pulmonary vascular congestion without frank edema. 2. COPD. Electronically Signed   By: Signa Kell M.D.   On: 06/02/2023 06:52   DG Chest Port 1 View Result Date: 05/30/2023 CLINICAL DATA:  Worsening shortness of breath. History of COPD. Sepsis workup. EXAM: PORTABLE CHEST 1 VIEW COMPARISON:  Portable chest 07/03/2022 FINDINGS: The heart is slightly enlarged. There is increased central vascular prominence. Interval new mild interstitial edema in both lung bases with trace pleural effusions. The lungs are emphysematous with no focal pulmonary consolidation. Findings consistent with mild CHF or fluid overload. The mediastinum is stable. The aorta is heavily calcified. Osteopenia and thoracic spondylosis. No new osseous finding. Multiple overlying monitor wires. IMPRESSION: 1. Findings consistent with mild CHF or fluid overload. 2. Emphysema.  No focal consolidation. 3. Aortic atherosclerosis. Electronically Signed   By: Almira Bar M.D.   On: 05/30/2023 23:08    Catarina Hartshorn, DO  [facility]  If 7PM-7AM, please contact night-coverage [email or web address] Password [email or web address] 06/02/2023, 7:33 AM   LOS: 3 days

## 2023-06-02 NOTE — Progress Notes (Signed)
*  PRELIMINARY RESULTS* Echocardiogram 2D Echocardiogram has been performed.  Stacey Drain 06/02/2023, 2:55 PM

## 2023-06-02 NOTE — Plan of Care (Signed)
  Problem: Education: Goal: Knowledge of General Education information will improve Description: Including pain rating scale, medication(s)/side effects and non-pharmacologic comfort measures Outcome: Progressing   Problem: Health Behavior/Discharge Planning: Goal: Ability to manage health-related needs will improve Outcome: Progressing   Problem: Clinical Measurements: Goal: Ability to maintain clinical measurements within normal limits will improve Outcome: Progressing Goal: Will remain free from infection Outcome: Progressing Goal: Diagnostic test results will improve Outcome: Progressing Goal: Respiratory complications will improve Outcome: Progressing Goal: Cardiovascular complication will be avoided Outcome: Progressing   Problem: Activity: Goal: Risk for activity intolerance will decrease Outcome: Progressing   Problem: Nutrition: Goal: Adequate nutrition will be maintained Outcome: Progressing   Problem: Coping: Goal: Level of anxiety will decrease Outcome: Progressing   Problem: Elimination: Goal: Will not experience complications related to bowel motility Outcome: Progressing Goal: Will not experience complications related to urinary retention Outcome: Progressing   Problem: Pain Managment: Goal: General experience of comfort will improve and/or be controlled Outcome: Progressing   Problem: Safety: Goal: Ability to remain free from injury will improve Outcome: Progressing   Problem: Skin Integrity: Goal: Risk for impaired skin integrity will decrease Outcome: Progressing   Problem: Education: Goal: Knowledge of disease or condition will improve Outcome: Progressing Goal: Knowledge of the prescribed therapeutic regimen will improve Outcome: Progressing Goal: Individualized Educational Video(s) Outcome: Progressing   Problem: Activity: Goal: Ability to tolerate increased activity will improve Outcome: Progressing Goal: Will verbalize the  importance of balancing activity with adequate rest periods Outcome: Progressing   Problem: Respiratory: Goal: Ability to maintain a clear airway will improve Outcome: Progressing Goal: Levels of oxygenation will improve Outcome: Progressing Goal: Ability to maintain adequate ventilation will improve Outcome: Progressing   Problem: Activity: Goal: Ability to tolerate increased activity will improve Outcome: Progressing   Problem: Clinical Measurements: Goal: Ability to maintain a body temperature in the normal range will improve Outcome: Progressing   Problem: Respiratory: Goal: Ability to maintain adequate ventilation will improve Outcome: Progressing Goal: Ability to maintain a clear airway will improve Outcome: Progressing   Problem: Safety: Goal: Non-violent Restraint(s) Outcome: Progressing

## 2023-06-02 NOTE — Progress Notes (Addendum)
 Critical Note  Patient is a 78 year old female admitted due to acute respiratory failure with hypoxia and hypercarbia secondary to COPD exacerbation in the setting of RSV infection.  She was wearned off BiPAP, but earlier this morning, she started to develop increasing work of breathing, so she was placed back on BiPAP.  BP was elevated at 218/85, so IV hydralazine 10 mg was given with improvement in BP to 194/82.  At bedside, patient was in respiratory distress with use of accessory muscles, she recently received IV 0.5 mg Ativan.  Due to respiratory rate at bedside ranging within the mid 30s to mid 40s, IV morphine 1 mg x 1 was given. On auscultation at bedside, she presents with expiratory wheezes and some rales in lower lobes. Chest x-ray done was personally reviewed and it showed some vascular congestion, so IV Lasix 20 mg x 1 was given.  IV fluid was held RT was called and breathing treatment was provided. e[facility] PCCM (Dr. Delia Chimes) was consulted and he agreed with above treatment and increase the PEEP to provide additional respiratory support.  Assessment and plan Acute respiratory distress possibly due to mild pulmonary vascular congestion Acute respiratory failure with hypoxia and hypercarbia in the setting of COPD exacerbation with superimposed RSV infection Continue management as described above  Please refer to admission H&P, progress note and critical care progress note regarding details about the care of this patient.  Critical time: 49 minutes   Critical care personally provided  managing the patient due to high probability of clinically significant and life threatening deterioration. This critical care time included obtaining a history; examining the patient, pulse oximetry; ordering and review of studies; arranging urgent treatment with development of a management plan; evaluation of patient's response of treatment; frequent reassessment; and discussions with other providers.  This  critical care time was performed to assess and manage the high probability of imminent and life threatening deterioration that could result in multi-organ failure.

## 2023-06-02 NOTE — Progress Notes (Signed)
 eLink Physician-Brief Progress Note Patient Name: Carla [name] DOB: 1946-02-23 MRN: [id number]   Date of Service  06/02/2023  HPI/Events of Note  78 year old female that initially presented with acute respiratory failure with hypoxemia in the setting of a COPD exacerbation and potential pneumonia.  Baseline dementia and ongoing encephalopathy requiring medication to tolerate BiPAP.  On last ABG, the patient is adequately ventilating and oxygenating.  She does have increased work of breathing which over the past few hours is led to increasing tachypnea and tachycardia and hypertension.  Hypertension was treated with hydralazine with moderate effect.  Diuretics were administered recently.  Radiography consistent with mild fluid overload.  BiPAP pressures-peak of 19 with minimal leak.  eICU Interventions  To better optimize her respiratory status, will increase the PEEP to provide additional respiratory support.  Agree with diuretics and scheduled SVNs.  Maintain scheduled steroids.  Note that the patient was previously DNR per Dr. Durel Salts.  Would encourage another discussion with family as her underlying mental status would be an obstruction to vent liberation and could contribute to further decline in her respiratory status.  If she develops hypotension or her hemodynamics further worsen, would recommend obtaining a blood gas.  Evidence of worsening acidosis or hypoxemia would be indications for potential invasive mechanical ventilation     Intervention Category Intermediate Interventions: Respiratory distress - evaluation and management  [name] 06/02/2023, 7:00 AM

## 2023-06-03 DIAGNOSIS — J9601 Acute respiratory failure with hypoxia: Secondary | ICD-10-CM | POA: Diagnosis not present

## 2023-06-03 DIAGNOSIS — J121 Respiratory syncytial virus pneumonia: Secondary | ICD-10-CM | POA: Diagnosis not present

## 2023-06-03 DIAGNOSIS — J9621 Acute and chronic respiratory failure with hypoxia: Secondary | ICD-10-CM | POA: Diagnosis not present

## 2023-06-03 DIAGNOSIS — F0390 Unspecified dementia without behavioral disturbance: Secondary | ICD-10-CM | POA: Diagnosis not present

## 2023-06-03 LAB — BASIC METABOLIC PANEL
Anion gap: 11 (ref 5–15)
BUN: 32 mg/dL — ABNORMAL HIGH (ref 8–23)
CO2: 30 mmol/L (ref 22–32)
Calcium: 9.6 mg/dL (ref 8.9–10.3)
Chloride: 105 mmol/L (ref 98–111)
Creatinine, Ser: 0.98 mg/dL (ref 0.44–1.00)
GFR, Estimated: 59 mL/min — ABNORMAL LOW (ref >60)
Glucose, Bld: 186 mg/dL — ABNORMAL HIGH (ref 70–99)
Potassium: 4.4 mmol/L (ref 3.5–5.1)
Sodium: 146 mmol/L — ABNORMAL HIGH (ref 135–145)

## 2023-06-03 LAB — PHOSPHORUS: Phosphorus: 4 mg/dL (ref 2.5–4.6)

## 2023-06-03 LAB — LEGIONELLA PNEUMOPHILA SEROGP 1 UR AG: L. pneumophila Serogp 1 Ur Ag: NEGATIVE

## 2023-06-03 LAB — MAGNESIUM: Magnesium: 2.5 mg/dL — ABNORMAL HIGH (ref 1.7–2.4)

## 2023-06-03 MED ORDER — REVEFENACIN 175 MCG/3ML IN SOLN
175.0000 ug | Freq: Every day | RESPIRATORY_TRACT | Status: DC
  Administered 2023-06-04 – 2023-06-13 (×10): 175 ug via RESPIRATORY_TRACT
  Filled 2023-06-03 (×10): qty 3

## 2023-06-03 MED ORDER — AMLODIPINE BESYLATE 5 MG PO TABS
5.0000 mg | ORAL_TABLET | Freq: Every day | ORAL | Status: DC
  Administered 2023-06-03 – 2023-06-05 (×3): 5 mg via ORAL
  Filled 2023-06-03 (×3): qty 1

## 2023-06-03 MED ORDER — ALBUTEROL SULFATE (2.5 MG/3ML) 0.083% IN NEBU: 2.5000 mg | INHALATION_SOLUTION | Freq: Four times a day (QID) | RESPIRATORY_TRACT | Status: DC

## 2023-06-03 MED ORDER — SODIUM CHLORIDE 0.9 % IV SOLN
500.0000 mg | Freq: Once | INTRAVENOUS | Status: AC
  Administered 2023-06-03: 500 mg via INTRAVENOUS
  Filled 2023-06-03: qty 5

## 2023-06-03 MED ORDER — SODIUM CHLORIDE 0.45 % IV SOLN: INTRAVENOUS | Status: AC

## 2023-06-03 NOTE — Plan of Care (Signed)
  Problem: Education: Goal: Knowledge of General Education information will improve Description: Including pain rating scale, medication(s)/side effects and non-pharmacologic comfort measures Outcome: Progressing   Problem: Health Behavior/Discharge Planning: Goal: Ability to manage health-related needs will improve Outcome: Progressing   Problem: Clinical Measurements: Goal: Ability to maintain clinical measurements within normal limits will improve Outcome: Progressing Goal: Will remain free from infection Outcome: Progressing Goal: Diagnostic test results will improve Outcome: Progressing Goal: Respiratory complications will improve Outcome: Progressing Goal: Cardiovascular complication will be avoided Outcome: Progressing   Problem: Activity: Goal: Risk for activity intolerance will decrease Outcome: Progressing   Problem: Nutrition: Goal: Adequate nutrition will be maintained Outcome: Progressing   Problem: Coping: Goal: Level of anxiety will decrease Outcome: Progressing   Problem: Elimination: Goal: Will not experience complications related to bowel motility Outcome: Progressing Goal: Will not experience complications related to urinary retention Outcome: Progressing   Problem: Pain Managment: Goal: General experience of comfort will improve and/or be controlled Outcome: Progressing   Problem: Safety: Goal: Ability to remain free from injury will improve Outcome: Progressing   Problem: Skin Integrity: Goal: Risk for impaired skin integrity will decrease Outcome: Progressing   Problem: Education: Goal: Knowledge of disease or condition will improve Outcome: Progressing Goal: Knowledge of the prescribed therapeutic regimen will improve Outcome: Progressing Goal: Individualized Educational Video(s) Outcome: Progressing   Problem: Activity: Goal: Ability to tolerate increased activity will improve Outcome: Progressing Goal: Will verbalize the  importance of balancing activity with adequate rest periods Outcome: Progressing   Problem: Respiratory: Goal: Ability to maintain a clear airway will improve Outcome: Progressing Goal: Levels of oxygenation will improve Outcome: Progressing Goal: Ability to maintain adequate ventilation will improve Outcome: Progressing   Problem: Activity: Goal: Ability to tolerate increased activity will improve Outcome: Progressing   Problem: Clinical Measurements: Goal: Ability to maintain a body temperature in the normal range will improve Outcome: Progressing   Problem: Respiratory: Goal: Ability to maintain adequate ventilation will improve Outcome: Progressing Goal: Ability to maintain a clear airway will improve Outcome: Progressing   Problem: Safety: Goal: Non-violent Restraint(s) Outcome: Progressing

## 2023-06-03 NOTE — Progress Notes (Signed)
 PROGRESS NOTE  Carla [name] GNF:[id number] DOB: March 19, 1946 DOA: 05/30/2023 PCP: Patient, [name]  Brief History:  78 year old female with a history of dementia, COPD, tobacco abuse, hypertension, anxiety, hyperlipidemia, and primary hyperparathyroidism presenting with 3-day history of shortness of breath.  The patient is unable to provide history at this time secondary to being on BiPAP and receiving some hypnotic medications.  History is obtained from review of the medical record and speaking with the patient's family.  At baseline, the patient is pleasantly confused and able to perform her activities of daily living.  The patient continues to smoke 2 packs/day.  Aside from shortness of breath, family states that there has not been any particular complaints.  She has had a nonproductive cough.  There is not been any fevers, vomiting, diarrhea, abdominal pain.  The patient has had a few loose stools without hematochezia or melena.  Because of worsening shortness of breath, the patient was brought to the emergency department for further evaluation and treatment.  Notably, the patient's son states that the patient does not take any of her medications as directed nor use her inhalers as directed at home. In the ED, the patient was afebrile and hemodynamically stable.  Oxygen saturation was in the 60s on room air.  The patient was placed on BiPAP.  WBC 10.5, hemoglobin 18.6, platelets 190.  Sodium 139, potassium 3.2, bicarbonate 25, serum creatinine 1.07.  LFTs were unremarkable.  Chest x-ray showed increased interstitial markings and hyperinflation.  COVID-19 PCR was negative.  RSV was positive.  Lactic acid 2.6>> 1.5.  PCT 0.42.  EKG shows sinus rhythm and nonspecific T wave changes.  VBG showed 7.3/50 select/60/27.  The patient was started on Solu-Medrol and bronchodilators.   Assessment/Plan: Acute respiratory failure with hypoxia and hypercarbia -Secondary to COPD exacerbation in the  setting of RSV infection -Wean off BiPAP as tolerated -weaned to 6-8L HFNC on 06/01/23 -06/02/23 @0530 --increase WOB>>placed back on BiPAP -expect that she may continue to need prn BiPAP for increase WOB -Initial VBG 7.3/56/60/27 -3/10 Personally reviewed chest x-ray--hyperinflation, slight increase interstitial markings -06/02/23--updated son--FULL CODE for now--I discussed concerns regarding her dementia, frailty, poor baseline pulm function if she required intubation/MV>>he will discuss with family regarding wishes -06/02/23--ativan 0.5mg , morphine 1mg  given; lasix 20 mg IV, hydralazine 10 mg; now on BiPAP -06/03/23--no distress last 25 hours;  resume daily trials OFF bipap   COPD exacerbation -Continue Pulmicort -Continue Brovana -Continue IV Solu-Medrol -Continue DuoNebs -add Yulperi -PCT 0.42   RSV pneumonitis -Supportive care   Tobacco abuse -Tobacco cessation discussed   Major neurocognitive disorder -Patient is at risk for hospital delirium and clinical decompensation   Hypokalemia -Repleted -check mag 2.5   Hypophosphatemia -repleted               Family Communication:   son updated 3/10   Consultants:  none   Code Status:  FULL    DVT Prophylaxis:  [place] Lovenox     Procedures: As Listed in Progress Note Above   Antibiotics: Ceftriaxone 3/7>>3/11 Azithro 3/7>>3/11             Subjective: ROS limited due to dementia.  No vomiting or diarrhea  Objective: Vitals:   06/03/23 1105 06/03/23 1106 06/03/23 1129 06/03/23 1130  BP:    (!) 157/60  Pulse: (!) 102 (!) 102    Resp: (!) 27 (!) 30  (!) 23  Temp:   97.6 F (36.4  C)   TempSrc:   Axillary   SpO2: 96% 100%  100%  Weight:      Height:        Intake/Output Summary (Last 24 hours) at 06/03/2023 1152 Last data filed at 06/03/2023 0401 Gross per 24 hour  Intake 919.79 ml  Output 1400 ml  Net -480.21 ml   Weight change:  Exam:  General:  Pt is alert, intermittently follows commands  appropriately, not in acute distress HEENT: No icterus, No thrush, No neck mass, [place]/AT Cardiovascular: RRR, S1/S2, no rubs, no gallops Respiratory: bilateral rales.  Diminished BS.  No wheeze Abdomen: Soft/+BS, non tender, non distended, no guarding Extremities: No edema, No lymphangitis, No petechiae, No rashes, no synovitis   Data Reviewed: I have personally reviewed following labs and imaging studies Basic Metabolic Panel: Recent Labs  Lab 05/30/23 1945 05/31/23 0345 06/01/23 0604 06/02/23 0436 06/03/23 0431  NA 139 139 144 145 146*  K 5.1 3.2* 4.1 3.7 4.4  CL 101 101 109 107 105  CO2 27 25 25 30 30   GLUCOSE 169* 238* 132* 195* 186*  BUN 27* 38* 42* 31* 32*  CREATININE 1.07* 1.27* 1.15* 0.80 0.98  CALCIUM 9.8 9.1 10.0 9.8 9.6  MG  --   --  2.7* 2.3 2.5*  PHOS  --   --  2.7 1.6* 4.0   Liver Function Tests: Recent Labs  Lab 05/30/23 1945  AST 29  ALT 16  ALKPHOS 113  BILITOT 1.0  PROT 8.5*  ALBUMIN 3.9   No results for input(s): "LIPASE", "AMYLASE" in the last 168 hours. No results for input(s): "AMMONIA" in the last 168 hours. Coagulation Profile: Recent Labs  Lab 05/30/23 1945  INR 1.1   CBC: Recent Labs  Lab 05/30/23 1945 05/31/23 0345 06/02/23 0436  WBC 10.5 8.0 19.8*  NEUTROABS 9.2*  --   --   HGB 18.6* 15.4* 14.4  HCT 58.1* 49.4* 46.3*  MCV 94.6 95.4 96.5  PLT 243 190 228   Cardiac Enzymes: No results for input(s): "CKTOTAL", "CKMB", "CKMBINDEX", "TROPONINI" in the last 168 hours. BNP: Invalid input(s): "POCBNP" CBG: No results for input(s): "GLUCAP" in the last 168 hours. HbA1C: Recent Labs    06/02/23 0436  HGBA1C 6.0*   Urine analysis:    Component Value Date/Time   COLORURINE AMBER (A) 05/31/2023 0607   APPEARANCEUR CLOUDY (A) 05/31/2023 0607   LABSPEC 1.018 05/31/2023 0607   PHURINE 5.0 05/31/2023 0607   GLUCOSEU NEGATIVE 05/31/2023 0607   HGBUR MODERATE (A) 05/31/2023 0607   BILIRUBINUR NEGATIVE 05/31/2023 0607    BILIRUBINUR negative 08/11/2019 1432   BILIRUBINUR small 06/02/2019 1539   KETONESUR NEGATIVE 05/31/2023 0607   PROTEINUR >=300 (A) 05/31/2023 0607   UROBILINOGEN 0.2 08/11/2019 1432   UROBILINOGEN 0.2 07/07/2013 1550   NITRITE NEGATIVE 05/31/2023 0607   LEUKOCYTESUR NEGATIVE 05/31/2023 0607   Sepsis Labs: @LABRCNTIP (procalcitonin:4,lacticidven:4) ) Recent Results (from the past 240 hours)  Resp panel by RT-PCR (RSV, Flu A&B, Covid) Anterior Nasal Swab     Status: Abnormal   Collection Time: 05/30/23  7:45 PM   Specimen: Anterior Nasal Swab  Result Value Ref Range Status   SARS Coronavirus 2 by RT PCR NEGATIVE NEGATIVE Final    Comment: (NOTE) SARS-CoV-2 target nucleic acids are NOT DETECTED.  The SARS-CoV-2 RNA is generally detectable in upper respiratory specimens during the acute phase of infection. The lowest concentration of SARS-CoV-2 viral copies this assay can detect is 138 copies/mL. A negative result does not  preclude SARS-Cov-2 infection and should not be used as the sole basis for treatment or other patient management decisions. A negative result may occur with  improper specimen collection/handling, submission of specimen other than nasopharyngeal swab, presence of viral mutation(s) within the areas targeted by this assay, and inadequate number of viral copies(<138 copies/mL). A negative result must be combined with clinical observations, patient history, and epidemiological information. The expected result is Negative.  Fact Sheet for Patients:  BloggerCourse.com  Fact Sheet for Healthcare Providers:  SeriousBroker.it  This test is no t yet approved or cleared by the [place] FDA and  has been authorized for detection and/or diagnosis of SARS-CoV-2 by FDA under an Emergency Use Authorization (EUA). This EUA will remain  in effect (meaning this test can be used) for the duration of the COVID-19 declaration  under Section 564(b)(1) of the Act, 21 U.S.C.section 360bbb-3(b)(1), unless the authorization is terminated  or revoked sooner.       Influenza A by PCR NEGATIVE NEGATIVE Final   Influenza B by PCR NEGATIVE NEGATIVE Final    Comment: (NOTE) The Xpert Xpress SARS-CoV-2/FLU/RSV plus assay is intended as an aid in the diagnosis of influenza from Nasopharyngeal swab specimens and should not be used as a sole basis for treatment. Nasal washings and aspirates are unacceptable for Xpert Xpress SARS-CoV-2/FLU/RSV testing.  Fact Sheet for Patients: BloggerCourse.com  Fact Sheet for Healthcare Providers: SeriousBroker.it  This test is not yet approved or cleared by the [place] FDA and has been authorized for detection and/or diagnosis of SARS-CoV-2 by FDA under an Emergency Use Authorization (EUA). This EUA will remain in effect (meaning this test can be used) for the duration of the COVID-19 declaration under Section 564(b)(1) of the Act, 21 U.S.C. section 360bbb-3(b)(1), unless the authorization is terminated or revoked.     Resp Syncytial Virus by PCR POSITIVE (A) NEGATIVE Final    Comment: (NOTE) Fact Sheet for Patients: BloggerCourse.com  Fact Sheet for Healthcare Providers: SeriousBroker.it  This test is not yet approved or cleared by the [place] FDA and has been authorized for detection and/or diagnosis of SARS-CoV-2 by FDA under an Emergency Use Authorization (EUA). This EUA will remain in effect (meaning this test can be used) for the duration of the COVID-19 declaration under Section 564(b)(1) of the Act, 21 U.S.C. section 360bbb-3(b)(1), unless the authorization is terminated or revoked.  Performed at [facility], [address] [place], [place] [address]   Blood Culture (routine x 2)     Status: None (Preliminary result)   Collection Time: 05/30/23  7:45 PM    Specimen: BLOOD  Result Value Ref Range Status   Specimen Description BLOOD BLOOD LEFT ARM AC  Final   Special Requests BOTTLES DRAWN AEROBIC AND ANAEROBIC  Final   Culture   Final    NO GROWTH 4 DAYS Performed at [facility], [address], [place], [place] [address]    Report Status PENDING  Incomplete  Blood Culture (routine x 2)     Status: None (Preliminary result)   Collection Time: 05/30/23  8:00 PM   Specimen: BLOOD  Result Value Ref Range Status   Specimen Description BLOOD BLOOD RIGHT HAND  Final   Special Requests BOTTLES DRAWN AEROBIC AND ANAEROBIC  Final   Culture   Final    NO GROWTH 4 DAYS Performed at [facility] [place] [facility], [address] [place], [place] [address]    Report Status PENDING  Incomplete  MRSA Next Gen by PCR,  Nasal     Status: None   Collection Time: 05/30/23  9:47 PM   Specimen: Nasal Mucosa; Nasal Swab  Result Value Ref Range Status   MRSA by PCR Next Gen NOT DETECTED NOT DETECTED Final    Comment: (NOTE) The GeneXpert MRSA Assay (FDA approved for NASAL specimens only), is one component of a comprehensive MRSA colonization surveillance program. It is not intended to diagnose MRSA infection nor to guide or monitor treatment for MRSA infections. Test performance is not FDA approved in patients less than 30 years old. Performed at [facility], [address], [place], [place] [address]      Scheduled Meds:  arformoterol  15 mcg Nebulization BID   budesonide (PULMICORT) nebulizer solution  0.5 mg Nebulization BID   Chlorhexidine Gluconate Cloth  6 each Topical Daily   enoxaparin (LOVENOX) injection  30 mg Subcutaneous Q24H   ipratropium-albuterol  3 mL Nebulization Q6H   methylPREDNISolone (SOLU-MEDROL) injection  60 mg Intravenous Q12H   mouth rinse  15 mL Mouth Rinse 4 times per day   Continuous Infusions:  cefTRIAXone (ROCEPHIN)  IV 2 g (06/02/23 2021)    Procedures/Studies: ECHOCARDIOGRAM COMPLETE Result Date: 06/02/2023    ECHOCARDIOGRAM  REPORT   Patient Name:   Carla [name] Date of Exam: 06/02/2023 Medical Rec #:  [id number]        Height:       60.0 in Accession #:    [id number]       Weight:       84.9 lb Date of Birth:  [birth year]        BSA:          1.297 m Patient Age:    78 years         BP:           171/74 mmHg Patient Gender: F                HR:           96 bpm. [facility] Procedure: 2D Echo, Cardiac Doppler and Color Doppler (Both Spectral and Color            Flow Doppler were utilized during procedure). Indications:    Congestive Heart Failure I50.9  History:        Patient has prior history of Echocardiogram examinations, most                 recent 06/14/2022. COPD; Risk Factors:Hypertension, Current                 Smoker, Dyslipidemia and Diabetes. RSV (respiratory syncytial                 virus pneumonia), Acute on chronic respiratory failure with                 hypoxia (HCC), Dementia.  Sonographer:    Celesta Gentile RCS Referring Phys: [phone] Ayanah Snader IMPRESSIONS  1. Left ventricular ejection fraction, by estimation, is 55 to 60%. The left ventricle has normal function. The left ventricle has no regional wall motion abnormalities. There is moderate left ventricular hypertrophy. Left ventricular diastolic parameters are consistent with Grade I diastolic dysfunction (impaired relaxation). Elevated left atrial pressure.  2. Right ventricular systolic function is normal. The right ventricular size is normal.  3. The mitral valve is normal in structure. No evidence of mitral valve regurgitation. No evidence of mitral stenosis.  4. The tricuspid valve is abnormal.  5. The aortic valve has an indeterminant number of cusps. There is moderate calcification of the aortic valve. There is moderate thickening of the aortic valve. Aortic valve regurgitation is moderate. Aortic valve sclerosis/calcification is present, without any evidence of aortic stenosis. FINDINGS  Left Ventricle: Left ventricular ejection fraction, by  estimation, is 55 to 60%. The left ventricle has normal function. The left ventricle has no regional wall motion abnormalities. The left ventricular internal cavity size was normal in size. There is  moderate left ventricular hypertrophy. Left ventricular diastolic parameters are consistent with Grade I diastolic dysfunction (impaired relaxation). Elevated left atrial pressure. Right Ventricle: Not able to assess, patient not able to cooperate with IVC assessment. The right ventricular size is normal. Right vetricular wall thickness was not well visualized. Right ventricular systolic function is normal. Left Atrium: Left atrial size was normal in size. Right Atrium: Right atrial size was normal in size. Pericardium: There is no evidence of pericardial effusion. Mitral Valve: The mitral valve is normal in structure. No evidence of mitral valve regurgitation. No evidence of mitral valve stenosis. Tricuspid Valve: The tricuspid valve is abnormal. Tricuspid valve regurgitation is mild . No evidence of tricuspid stenosis. Aortic Valve: The aortic valve has an indeterminant number of cusps. There is moderate calcification of the aortic valve. There is moderate thickening of the aortic valve. There is moderate aortic valve annular calcification. Aortic valve regurgitation is moderate. Aortic regurgitation PHT measures 281 msec. Aortic valve sclerosis/calcification is present, without any evidence of aortic stenosis. Aortic valve mean gradient measures 5.0 mmHg. Aortic valve peak gradient measures 10.1 mmHg. Aortic valve area, by VTI measures 2.36 cm. Pulmonic Valve: The pulmonic valve was not well visualized. Pulmonic valve regurgitation is not visualized. No evidence of pulmonic stenosis. Aorta: The aortic root is normal in size and structure. Venous: The inferior vena cava was not well visualized. IAS/Shunts: No atrial level shunt detected by color flow Doppler.  LEFT VENTRICLE PLAX 2D LVIDd:         3.70 cm    Diastology LVIDs:         2.60 cm   LV e' medial:    5.11 cm/s LV PW:         1.30 cm   LV E/e' medial:  19.8 LV IVS:        1.20 cm   LV e' lateral:   6.74 cm/s LVOT diam:     1.80 cm   LV E/e' lateral: 15.0 LV SV:         64 LV SV Index:   49 LVOT Area:     2.54 cm  RIGHT VENTRICLE RV S prime:     12.45 cm/s TAPSE (M-mode): 1.7 cm LEFT ATRIUM             Index        RIGHT ATRIUM           Index LA diam:        2.75 cm 2.12 cm/m   RA Area:     14.70 cm LA Vol (A2C):   43.7 ml 33.70 ml/m  RA Volume:   35.90 ml  27.68 ml/m LA Vol (A4C):   23.9 ml 18.43 ml/m LA Biplane Vol: 32.8 ml 25.29 ml/m  AORTIC VALVE AV Area (Vmax):    2.35 cm AV Area (Vmean):   2.28 cm AV Area (VTI):     2.36 cm AV Vmax:           159.00  cm/s AV Vmean:          104.000 cm/s AV VTI:            0.271 m AV Peak Grad:      10.1 mmHg AV Mean Grad:      5.0 mmHg LVOT Vmax:         147.00 cm/s LVOT Vmean:        93.100 cm/s LVOT VTI:          0.251 m LVOT/AV VTI ratio: 0.93 AI PHT:            281 msec  AORTA Ao Root diam: 3.30 cm Ao Asc diam:  3.30 cm MITRAL VALVE                TRICUSPID VALVE MV Area (PHT): 4.15 cm     TR Peak grad:   26.4 mmHg MV Decel Time: 183 msec     TR Vmax:        257.00 cm/s MV E velocity: 101.00 cm/s MV A velocity: 148.00 cm/s  SHUNTS MV E/A ratio:  0.68         Systemic VTI:  0.25 m                             Systemic Diam: 1.80 cm Dina Rich MD Electronically signed by Dina Rich MD Signature Date/Time: 06/02/2023/3:32:10 PM    Final    DG Chest Port 1 View Result Date: 06/02/2023 CLINICAL DATA:  Shortness of breath and COPD. EXAM: PORTABLE CHEST 1 VIEW COMPARISON:  05/30/2023 FINDINGS: Stable cardiomediastinal contours. Aortic atherosclerosis. Lungs appear hyperinflated. Coarsened interstitial markings. Pulmonary vascular congestion without frank edema. No airspace consolidation, atelectasis or pneumothorax. Visualized osseous structures are intact. IMPRESSION: 1. Pulmonary vascular congestion  without frank edema. 2. COPD. Electronically Signed   By: Signa Kell M.D.   On: 06/02/2023 06:52   DG Chest Port 1 View Result Date: 05/30/2023 CLINICAL DATA:  Worsening shortness of breath. History of COPD. Sepsis workup. EXAM: PORTABLE CHEST 1 VIEW COMPARISON:  Portable chest 07/03/2022 FINDINGS: The heart is slightly enlarged. There is increased central vascular prominence. Interval new mild interstitial edema in both lung bases with trace pleural effusions. The lungs are emphysematous with no focal pulmonary consolidation. Findings consistent with mild CHF or fluid overload. The mediastinum is stable. The aorta is heavily calcified. Osteopenia and thoracic spondylosis. No new osseous finding. Multiple overlying monitor wires. IMPRESSION: 1. Findings consistent with mild CHF or fluid overload. 2. Emphysema.  No focal consolidation. 3. Aortic atherosclerosis. Electronically Signed   By: Almira Bar M.D.   On: 05/30/2023 23:08    Catarina Hartshorn, DO  [facility]  If 7PM-7AM, please contact night-coverage [email or web address] Password [facility] 06/03/2023, 11:52 AM   LOS: 4 days

## 2023-06-03 NOTE — Plan of Care (Signed)
 Patient currently on Bipap. Only AxO to self.

## 2023-06-04 ENCOUNTER — Encounter (HOSPITAL_COMMUNITY): Payer: Self-pay | Admitting: Primary Care

## 2023-06-04 DIAGNOSIS — Z7189 Other specified counseling: Secondary | ICD-10-CM

## 2023-06-04 DIAGNOSIS — Z515 Encounter for palliative care: Secondary | ICD-10-CM | POA: Diagnosis not present

## 2023-06-04 DIAGNOSIS — J9621 Acute and chronic respiratory failure with hypoxia: Secondary | ICD-10-CM | POA: Diagnosis not present

## 2023-06-04 LAB — PROCALCITONIN: Procalcitonin: 0.16 ng/mL

## 2023-06-04 LAB — CULTURE, BLOOD (ROUTINE X 2)
Culture: NO GROWTH
Culture: NO GROWTH

## 2023-06-04 LAB — LACTIC ACID, PLASMA
Lactic Acid, Venous: 2.2 mmol/L (ref 0.5–1.9)
Lactic Acid, Venous: 1.3 mmol/L (ref 0.5–1.9)

## 2023-06-04 MED ORDER — LEVALBUTEROL HCL 0.63 MG/3ML IN NEBU
0.6300 mg | INHALATION_SOLUTION | Freq: Four times a day (QID) | RESPIRATORY_TRACT | Status: DC
  Administered 2023-06-04 – 2023-06-05 (×6): 0.63 mg via RESPIRATORY_TRACT
  Filled 2023-06-04 (×6): qty 3

## 2023-06-04 MED ORDER — METOPROLOL TARTRATE 5 MG/5ML IV SOLN
5.0000 mg | Freq: Four times a day (QID) | INTRAVENOUS | Status: DC
  Administered 2023-06-04 – 2023-06-05 (×4): 5 mg via INTRAVENOUS
  Filled 2023-06-04 (×4): qty 5

## 2023-06-04 MED ORDER — FUROSEMIDE 10 MG/ML IJ SOLN: 40.0000 mg | Freq: Once | INTRAMUSCULAR | Status: DC

## 2023-06-04 MED ORDER — MORPHINE SULFATE (PF) 2 MG/ML IV SOLN
1.0000 mg | INTRAVENOUS | Status: DC | PRN
  Administered 2023-06-05 – 2023-06-06 (×2): 1 mg via INTRAVENOUS
  Filled 2023-06-04 (×2): qty 1

## 2023-06-04 MED ORDER — FUROSEMIDE 10 MG/ML IJ SOLN
40.0000 mg | Freq: Once | INTRAMUSCULAR | Status: AC
  Administered 2023-06-04: 40 mg via INTRAVENOUS
  Filled 2023-06-04: qty 4

## 2023-06-04 MED ORDER — HALOPERIDOL LACTATE 5 MG/ML IJ SOLN
2.0000 mg | Freq: Four times a day (QID) | INTRAMUSCULAR | Status: DC | PRN
Start: 2023-06-04 — End: 2023-06-06
  Administered 2023-06-04 – 2023-06-05 (×3): 2 mg via INTRAVENOUS
  Filled 2023-06-04 (×3): qty 1

## 2023-06-04 MED ORDER — SODIUM CHLORIDE 0.9 % IV SOLN
500.0000 mg | INTRAVENOUS | Status: AC
  Administered 2023-06-04 – 2023-06-05 (×2): 500 mg via INTRAVENOUS
  Filled 2023-06-04 (×2): qty 5

## 2023-06-04 MED ORDER — MORPHINE SULFATE (PF) 2 MG/ML IV SOLN: 1.0000 mg | INTRAVENOUS | Status: DC | PRN

## 2023-06-04 MED ORDER — IPRATROPIUM BROMIDE 0.02 % IN SOLN
0.5000 mg | Freq: Four times a day (QID) | RESPIRATORY_TRACT | Status: DC
  Administered 2023-06-04 – 2023-06-05 (×6): 0.5 mg via RESPIRATORY_TRACT
  Filled 2023-06-04 (×6): qty 2.5

## 2023-06-04 NOTE — Progress Notes (Signed)
 Patient became restless and heart rate jumped up to the 130-150s. EKG done. Patient unable to answer if any chest pain and just stating, "stop". O2 sat around 95% but patient still tossing and turning in the bed. Bed linen changed and patient had voided but still remained restless. Bipap placed on patient and PRN Ativan given with little effect. Heart rate continued to be elevated. Dr Flossie Dibble made aware of symptoms and restlessness. Patient remains on Bipap and Metoprolol IV given per orders with good effect. Heart rate is currently in the 90-low 100's.

## 2023-06-04 NOTE — Plan of Care (Signed)
 Problem: Education: Goal: Knowledge of General Education information will improve Description: Including pain rating scale, medication(s)/side effects and non-pharmacologic comfort measures 06/04/2023 1257 by Doran Durand, RN Outcome: Not Progressing 06/04/2023 1145 by Doran Durand, RN Outcome: Not Progressing 06/04/2023 1134 by Doran Durand, RN Outcome: Not Progressing   Problem: Health Behavior/Discharge Planning: Goal: Ability to manage health-related needs will improve 06/04/2023 1257 by Doran Durand, RN Outcome: Not Progressing 06/04/2023 1145 by Doran Durand, RN Outcome: Not Progressing 06/04/2023 1134 by Doran Durand, RN Outcome: Not Progressing   Problem: Clinical Measurements: Goal: Ability to maintain clinical measurements within normal limits will improve 06/04/2023 1257 by Doran Durand, RN Outcome: Not Progressing 06/04/2023 1145 by Doran Durand, RN Outcome: Not Progressing 06/04/2023 1134 by Doran Durand, RN Outcome: Not Progressing Goal: Will remain free from infection 06/04/2023 1257 by Doran Durand, RN Outcome: Not Progressing 06/04/2023 1145 by Doran Durand, RN Outcome: Not Progressing 06/04/2023 1134 by Doran Durand, RN Outcome: Not Progressing Goal: Diagnostic test results will improve 06/04/2023 1257 by Doran Durand, RN Outcome: Not Progressing 06/04/2023 1145 by Doran Durand, RN Outcome: Not Progressing 06/04/2023 1134 by Doran Durand, RN Outcome: Not Progressing Goal: Respiratory complications will improve 06/04/2023 1257 by Doran Durand, RN Outcome: Not Progressing 06/04/2023 1145 by Doran Durand, RN Outcome: Not Progressing 06/04/2023 1134 by Doran Durand, RN Outcome: Not Progressing Goal: Cardiovascular complication will be avoided 06/04/2023 1257 by Doran Durand, RN Outcome: Not Progressing 06/04/2023 1145 by Doran Durand, RN Outcome: Not Progressing 06/04/2023 1134 by Doran Durand, RN Outcome: Not Progressing   Problem: Activity: Goal: Risk for activity intolerance will decrease 06/04/2023 1257 by Doran Durand, RN Outcome: Not Progressing 06/04/2023 1145 by Doran Durand, RN Outcome: Not Progressing 06/04/2023 1134 by Doran Durand, RN Outcome: Not Progressing   Problem: Nutrition: Goal: Adequate nutrition will be maintained 06/04/2023 1257 by Doran Durand, RN Outcome: Not Progressing 06/04/2023 1145 by Doran Durand, RN Outcome: Not Progressing 06/04/2023 1134 by Doran Durand, RN Outcome: Not Progressing   Problem: Coping: Goal: Level of anxiety will decrease 06/04/2023 1257 by Doran Durand, RN Outcome: Not Progressing 06/04/2023 1145 by Doran Durand, RN Outcome: Not Progressing 06/04/2023 1134 by Doran Durand, RN Outcome: Not Progressing   Problem: Elimination: Goal: Will not experience complications related to bowel motility 06/04/2023 1257 by Doran Durand, RN Outcome: Not Progressing 06/04/2023 1145 by Doran Durand, RN Outcome: Not Progressing 06/04/2023 1134 by Doran Durand, RN Outcome: Not Progressing Goal: Will not experience complications related to urinary retention 06/04/2023 1257 by Doran Durand, RN Outcome: Not Progressing 06/04/2023 1145 by Doran Durand, RN Outcome: Not Progressing 06/04/2023 1134 by Doran Durand, RN Outcome: Not Progressing   Problem: Pain Managment: Goal: General experience of comfort will improve and/or be controlled 06/04/2023 1257 by Doran Durand, RN Outcome: Not Progressing 06/04/2023 1145 by Doran Durand, RN Outcome: Not Progressing 06/04/2023 1134 by Doran Durand, RN Outcome: Not Progressing   Problem: Safety: Goal: Ability to remain free from injury will improve 06/04/2023 1257 by Doran Durand, RN Outcome: Not Progressing 06/04/2023 1145 by Doran Durand, RN Outcome: Not Progressing 06/04/2023 1134 by Doran Durand, RN Outcome:  Not Progressing   Problem: Skin Integrity: Goal: Risk for impaired skin integrity will decrease 06/04/2023 1257 by Doran Durand, RN Outcome: Not Progressing  06/04/2023 1145 by Doran Durand, RN Outcome: Not Progressing 06/04/2023 1134 by Doran Durand, RN Outcome: Not Progressing   Problem: Education: Goal: Knowledge of disease or condition will improve 06/04/2023 1257 by Doran Durand, RN Outcome: Not Progressing 06/04/2023 1145 by Doran Durand, RN Outcome: Not Progressing 06/04/2023 1134 by Doran Durand, RN Outcome: Not Progressing Goal: Knowledge of the prescribed therapeutic regimen will improve 06/04/2023 1257 by Doran Durand, RN Outcome: Not Progressing 06/04/2023 1145 by Doran Durand, RN Outcome: Not Progressing 06/04/2023 1134 by Doran Durand, RN Outcome: Not Progressing Goal: Individualized Educational Video(s) 06/04/2023 1257 by Doran Durand, RN Outcome: Not Progressing 06/04/2023 1145 by Doran Durand, RN Outcome: Not Progressing 06/04/2023 1134 by Doran Durand, RN Outcome: Not Progressing   Problem: Activity: Goal: Ability to tolerate increased activity will improve 06/04/2023 1257 by Doran Durand, RN Outcome: Not Progressing 06/04/2023 1145 by Doran Durand, RN Outcome: Not Progressing 06/04/2023 1134 by Doran Durand, RN Outcome: Not Progressing Goal: Will verbalize the importance of balancing activity with adequate rest periods 06/04/2023 1257 by Doran Durand, RN Outcome: Not Progressing 06/04/2023 1145 by Doran Durand, RN Outcome: Not Progressing 06/04/2023 1134 by Doran Durand, RN Outcome: Not Progressing   Problem: Respiratory: Goal: Ability to maintain a clear airway will improve 06/04/2023 1257 by Doran Durand, RN Outcome: Not Progressing 06/04/2023 1145 by Doran Durand, RN Outcome: Not Progressing 06/04/2023 1134 by Doran Durand, RN Outcome: Not Progressing Goal: Levels of  oxygenation will improve 06/04/2023 1257 by Doran Durand, RN Outcome: Not Progressing 06/04/2023 1145 by Doran Durand, RN Outcome: Not Progressing 06/04/2023 1134 by Doran Durand, RN Outcome: Not Progressing Goal: Ability to maintain adequate ventilation will improve 06/04/2023 1257 by Doran Durand, RN Outcome: Not Progressing 06/04/2023 1145 by Doran Durand, RN Outcome: Not Progressing 06/04/2023 1134 by Doran Durand, RN Outcome: Not Progressing   Problem: Activity: Goal: Ability to tolerate increased activity will improve 06/04/2023 1257 by Doran Durand, RN Outcome: Not Progressing 06/04/2023 1145 by Doran Durand, RN Outcome: Not Progressing 06/04/2023 1134 by Doran Durand, RN Outcome: Not Progressing   Problem: Clinical Measurements: Goal: Ability to maintain a body temperature in the normal range will improve 06/04/2023 1257 by Doran Durand, RN Outcome: Not Progressing 06/04/2023 1145 by Doran Durand, RN Outcome: Not Progressing 06/04/2023 1134 by Doran Durand, RN Outcome: Not Progressing   Problem: Respiratory: Goal: Ability to maintain adequate ventilation will improve 06/04/2023 1257 by Doran Durand, RN Outcome: Not Progressing 06/04/2023 1145 by Doran Durand, RN Outcome: Not Progressing 06/04/2023 1134 by Doran Durand, RN Outcome: Not Progressing Goal: Ability to maintain a clear airway will improve 06/04/2023 1257 by Doran Durand, RN Outcome: Not Progressing 06/04/2023 1145 by Doran Durand, RN Outcome: Not Progressing 06/04/2023 1134 by Doran Durand, RN Outcome: Not Progressing   Problem: Safety: Goal: Non-violent Restraint(s) 06/04/2023 1257 by Doran Durand, RN Outcome: Not Progressing 06/04/2023 1145 by Doran Durand, RN Outcome: Not Progressing 06/04/2023 1134 by Doran Durand, RN Outcome: Not Progressing  Patient alert to self only.

## 2023-06-04 NOTE — Progress Notes (Signed)
 PROGRESS NOTE  ILIZA BLANKENBECKLER ZOX:[id number] DOB: [birth year] DOA: 05/30/2023 PCP: Patient, [name]  Subjective: The patient was seen and examined this morning, was on BiPAP, Refused agitated, withdraws to pain, does not follow any command  Later patient was examined again, off BiPAP on high flow oxygen by nasal cannula, satting 93-95% Remained tachypneic, tachycardic   Brief History:  Carla [name] 78 year old female with a history of dementia, COPD, tobacco abuse, hypertension, anxiety, hyperlipidemia, and primary hyperparathyroidism presenting with 3-day history of shortness of breath.  The patient is unable to provide history at this time secondary to being on BiPAP and receiving some hypnotic medications.  History is obtained from review of the medical record and speaking with the patient's family.  At baseline, the patient is pleasantly confused and able to perform her activities of daily living.  The patient continues to smoke 2 packs/day.  Aside from shortness of breath, family states that there has not been any particular complaints.  She has had a nonproductive cough.  There is not been any fevers, vomiting, diarrhea, abdominal pain.  The patient has had a few loose stools without hematochezia or melena.  Because of worsening shortness of breath, the patient was brought to the emergency department for further evaluation and treatment.  Notably, the patient's son states that the patient does not take any of her medications as directed nor use her inhalers as directed at home. In the ED, the patient was afebrile and hemodynamically stable.  Oxygen saturation was in the 60s on room air.  The patient was placed on BiPAP.  WBC 10.5, hemoglobin 18.6, platelets 190.  Sodium 139, potassium 3.2, bicarbonate 25, serum creatinine 1.07.  LFTs were unremarkable.  Chest x-ray showed increased interstitial markings and hyperinflation.  COVID-19 PCR was negative.  RSV was positive.  Lactic  acid 2.6>> 1.5.  PCT 0.42.  EKG shows sinus rhythm and nonspecific T wave changes.  VBG showed 7.3/50 select/60/27.  The patient was started on Solu-Medrol and bronchodilators.   Assessment/Plan:  Acute respiratory failure with hypoxia and hypercarbia Patient remained confused, in respiratory failure  -Secondary to COPD exacerbation in the setting of RSV infection -Wean off BiPAP as tolerated--- on BiPAP this morning -weaned to 6-8L HFNC on 06/01/23   -06/02/23 @0530 --increase WOB>>placed back on BiPAP -Initial VBG 7.3/56/60/27 -3/10 chest x-ray--hyperinflation, slight increase interstitial markings -06/02/23--updated son--FULL CODE for now-- Dr. Arbutus Leas discussed concerns regarding her dementia, frailty, poor baseline pulm function if she required intubation/MV>>he will discuss with family regarding wishes -06/02/23--ativan 0.5mg , morphine 1mg  given; lasix 20 mg IV, hydralazine 10 mg; now on BiPAP   -12/05/2023 currently on 3 L of oxygen, satting 93% -agitated, as needed Haldol, Ativan added  COPD exacerbation -Acute hypoxic respiratory failure -Continue Pulmicort -Continue Brovana -Continue IV Solu-Medrol -Continue DuoNebs -PCT 0.42   RSV pneumonitis -Supportive care   Tobacco abuse -Tobacco cessation discussed   Major neurocognitive disorder -Patient is at risk for hospital delirium and clinical decompensation   Hypokalemia -Replete -check mag 2.7  Hypophosphatemia -replete    Prognosis remain poor-due to multiple comorbidities and acute respiratory failure. Palliative care consulted Continue discussion with family regarding CODE STATUS, and goals of care     Family Communication:   son updated 3/10  Consultants:  none  Code Status:  FULL   DVT Prophylaxis:  [facility] Lovenox   Procedures: As Listed in Progress Note Above  Antibiotics: Ceftriaxone 3/7>> Azithro 3/7>>  Objective: Vitals:   06/04/23 1100 06/04/23 1130 06/04/23 1200 06/04/23 1211  BP:   (!) 180/78 114/68   Pulse: (!) 128 (!) 111 (!) 102   Resp: (!) 23 (!) 26 (!) 27   Temp:    97.9 F (36.6 C)  TempSrc:    Axillary  SpO2: 92% 95% 93%   Weight:      Height:        Intake/Output Summary (Last 24 hours) at 06/04/2023 1240 Last data filed at 06/04/2023 0543 Gross per 24 hour  Intake 1004.08 ml  Output 950 ml  Net 54.08 ml   Weight change: 0 kg Exam:   General:  Confused, agitated,  HEENT:  Normocephalic, PERRL, otherwise with in Normal limits   Neuro:  Exam, patient is confused, agitated in bed  Lungs:   Poor air exchange diffusely, on BiPAP, positive for rhonchi, minimal wheezing, negative for any crackles at lower lobes  Cardio:    S1/S2, RRR, No murmure, No Rubs or Gallops   Abdomen:  Soft, non-tender, bowel sounds active all four quadrants, no guarding or peritoneal signs.  Muscular  skeletal:  Limited exam -global generalized weaknesses - in bed, able to move all 4 extremities,   2+ pulses,  symmetric, No pitting edema  Skin:  Dry, warm to touch, negative for any Rashes,  Wounds: Please see nursing documentation          Data Reviewed: I have personally reviewed following labs and imaging studies Basic Metabolic Panel: Recent Labs  Lab 05/30/23 1945 05/31/23 0345 06/01/23 0604 06/02/23 0436 06/03/23 0431  NA 139 139 144 145 146*  K 5.1 3.2* 4.1 3.7 4.4  CL 101 101 109 107 105  CO2 27 25 25 30 30   GLUCOSE 169* 238* 132* 195* 186*  BUN 27* 38* 42* 31* 32*  CREATININE 1.07* 1.27* 1.15* 0.80 0.98  CALCIUM 9.8 9.1 10.0 9.8 9.6  MG  --   --  2.7* 2.3 2.5*  PHOS  --   --  2.7 1.6* 4.0   Liver Function Tests: Recent Labs  Lab 05/30/23 1945  AST 29  ALT 16  ALKPHOS 113  BILITOT 1.0  PROT 8.5*  ALBUMIN 3.9   No results for input(s): "LIPASE", "AMYLASE" in the last 168 hours. No results for input(s): "AMMONIA" in the last 168 hours. Coagulation Profile: Recent Labs  Lab 05/30/23 1945  INR 1.1   CBC: Recent Labs  Lab 05/30/23 1945  05/31/23 0345 06/02/23 0436  WBC 10.5 8.0 19.8*  NEUTROABS 9.2*  --   --   HGB 18.6* 15.4* 14.4  HCT 58.1* 49.4* 46.3*  MCV 94.6 95.4 96.5  PLT 243 190 228    HbA1C: Recent Labs    06/02/23 0436  HGBA1C 6.0*   Urine analysis:    Component Value Date/Time   COLORURINE AMBER (A) 05/31/2023 0607   APPEARANCEUR CLOUDY (A) 05/31/2023 0607   LABSPEC 1.018 05/31/2023 0607   PHURINE 5.0 05/31/2023 0607   GLUCOSEU NEGATIVE 05/31/2023 0607   HGBUR MODERATE (A) 05/31/2023 0607   BILIRUBINUR NEGATIVE 05/31/2023 0607   BILIRUBINUR negative 08/11/2019 1432   BILIRUBINUR small 06/02/2019 1539   KETONESUR NEGATIVE 05/31/2023 0607   PROTEINUR >=300 (A) 05/31/2023 0607   UROBILINOGEN 0.2 08/11/2019 1432   UROBILINOGEN 0.2 07/07/2013 1550   NITRITE NEGATIVE 05/31/2023 0607   LEUKOCYTESUR NEGATIVE 05/31/2023 0607   Sepsis Labs: @LABRCNTIP (procalcitonin:4,lacticidven:4) ) Recent Results (from the past 240 hours)  Resp panel by RT-PCR (RSV, Flu A&B,  Covid) Anterior Nasal Swab     Status: Abnormal   Collection Time: 05/30/23  7:45 PM   Specimen: Anterior Nasal Swab  Result Value Ref Range Status   SARS Coronavirus 2 by RT PCR NEGATIVE NEGATIVE Final    Comment: (NOTE) SARS-CoV-2 target nucleic acids are NOT DETECTED.  The SARS-CoV-2 RNA is generally detectable in upper respiratory specimens during the acute phase of infection. The lowest concentration of SARS-CoV-2 viral copies this assay can detect is 138 copies/mL. A negative result does not preclude SARS-Cov-2 infection and should not be used as the sole basis for treatment or other patient management decisions. A negative result may occur with  improper specimen collection/handling, submission of specimen other than nasopharyngeal swab, presence of viral mutation(s) within the areas targeted by this assay, and inadequate number of viral copies(<138 copies/mL). A negative result must be combined with clinical observations,  patient history, and epidemiological information. The expected result is Negative.  Fact Sheet for Patients:  BloggerCourse.com  Fact Sheet for Healthcare Providers:  SeriousBroker.it  This test is no t yet approved or cleared by the [place] FDA and  has been authorized for detection and/or diagnosis of SARS-CoV-2 by FDA under an Emergency Use Authorization (EUA). This EUA will remain  in effect (meaning this test can be used) for the duration of the COVID-19 declaration under Section 564(b)(1) of the Act, 21 U.S.C.section 360bbb-3(b)(1), unless the authorization is terminated  or revoked sooner.       Influenza A by PCR NEGATIVE NEGATIVE Final   Influenza B by PCR NEGATIVE NEGATIVE Final    Comment: (NOTE) The Xpert Xpress SARS-CoV-2/FLU/RSV plus assay is intended as an aid in the diagnosis of influenza from Nasopharyngeal swab specimens and should not be used as a sole basis for treatment. Nasal washings and aspirates are unacceptable for Xpert Xpress SARS-CoV-2/FLU/RSV testing.  Fact Sheet for Patients: BloggerCourse.com  Fact Sheet for Healthcare Providers: SeriousBroker.it  This test is not yet approved or cleared by the [place] FDA and has been authorized for detection and/or diagnosis of SARS-CoV-2 by FDA under an Emergency Use Authorization (EUA). This EUA will remain in effect (meaning this test can be used) for the duration of the COVID-19 declaration under Section 564(b)(1) of the Act, 21 U.S.C. section 360bbb-3(b)(1), unless the authorization is terminated or revoked.     Resp Syncytial Virus by PCR POSITIVE (A) NEGATIVE Final    Comment: (NOTE) Fact Sheet for Patients: BloggerCourse.com  Fact Sheet for Healthcare Providers: SeriousBroker.it  This test is not yet approved or cleared by the [place] FDA and has been authorized for detection and/or diagnosis of SARS-CoV-2 by FDA under an Emergency Use Authorization (EUA). This EUA will remain in effect (meaning this test can be used) for the duration of the COVID-19 declaration under Section 564(b)(1) of the Act, 21 U.S.C. section 360bbb-3(b)(1), unless the authorization is terminated or revoked.  Performed at [facility], [address] [place], [place] [address]   Blood Culture (routine x 2)     Status: None   Collection Time: 05/30/23  7:45 PM   Specimen: BLOOD  Result Value Ref Range Status   Specimen Description BLOOD BLOOD LEFT ARM AC  Final   Special Requests BOTTLES DRAWN AEROBIC AND ANAEROBIC  Final   Culture   Final    NO GROWTH 5 DAYS Performed at [facility], [address], [place], [place] [address]    Report Status 06/04/2023 FINAL  Final  Blood Culture (  routine x 2)     Status: None   Collection Time: 05/30/23  8:00 PM   Specimen: BLOOD  Result Value Ref Range Status   Specimen Description BLOOD BLOOD RIGHT HAND  Final   Special Requests BOTTLES DRAWN AEROBIC AND ANAEROBIC  Final   Culture   Final    NO GROWTH 5 DAYS Performed at [facility], [address], [place], [place] [address]    Report Status 06/04/2023 FINAL  Final  MRSA Next Gen by PCR, Nasal     Status: None   Collection Time: 05/30/23  9:47 PM   Specimen: Nasal Mucosa; Nasal Swab  Result Value Ref Range Status   MRSA by PCR Next Gen NOT DETECTED NOT DETECTED Final    Comment: (NOTE) The GeneXpert MRSA Assay (FDA approved for NASAL specimens only), is one component of a comprehensive MRSA colonization surveillance program. It is not intended to diagnose MRSA infection nor to guide or monitor treatment for MRSA infections. Test performance is not FDA approved in patients less than 5 years old. Performed at [facility], [address], [place], [place] [address]      Scheduled Meds:  amLODipine  5 mg Oral Daily   arformoterol  15  mcg Nebulization BID   budesonide (PULMICORT) nebulizer solution  0.5 mg Nebulization BID   Chlorhexidine Gluconate Cloth  6 each Topical Daily   enoxaparin (LOVENOX) injection  30 mg Subcutaneous Q24H   ipratropium  0.5 mg Nebulization Q6H   levalbuterol  0.63 mg Nebulization Q6H   methylPREDNISolone (SOLU-MEDROL) injection  60 mg Intravenous Q12H   mouth rinse  15 mL Mouth Rinse 4 times per day   revefenacin  175 mcg Nebulization Daily   Continuous Infusions:  azithromycin      Procedures/Studies: ECHOCARDIOGRAM COMPLETE Result Date: 06/02/2023    ECHOCARDIOGRAM REPORT   Patient Name:   Carla [name] Date of Exam: 06/02/2023 Medical Rec #:  [id number]        Height:       60.0 in Accession #:    [id number]       Weight:       84.9 lb Date of Birth:  1946-03-03        BSA:          1.297 m Patient Age:    78 years         BP:           171/74 mmHg Patient Gender: F                HR:           96 bpm. [facility] Procedure: 2D Echo, Cardiac Doppler and Color Doppler (Both Spectral and Color            Flow Doppler were utilized during procedure). Indications:    Congestive Heart Failure I50.9  History:        Patient has prior history of Echocardiogram examinations, most                 recent 06/14/2022. COPD; Risk Factors:Hypertension, Current                 Smoker, Dyslipidemia and Diabetes. RSV (respiratory syncytial                 virus pneumonia), Acute on chronic respiratory failure with                 hypoxia (HCC),  Dementia.  Sonographer:    Celesta Gentile RCS Referring Phys: [phone] DAVID TAT IMPRESSIONS  1. Left ventricular ejection fraction, by estimation, is 55 to 60%. The left ventricle has normal function. The left ventricle has no regional wall motion abnormalities. There is moderate left ventricular hypertrophy. Left ventricular diastolic parameters are consistent with Grade I diastolic dysfunction (impaired relaxation). Elevated left atrial pressure.  2. Right  ventricular systolic function is normal. The right ventricular size is normal.  3. The mitral valve is normal in structure. No evidence of mitral valve regurgitation. No evidence of mitral stenosis.  4. The tricuspid valve is abnormal.  5. The aortic valve has an indeterminant number of cusps. There is moderate calcification of the aortic valve. There is moderate thickening of the aortic valve. Aortic valve regurgitation is moderate. Aortic valve sclerosis/calcification is present, without any evidence of aortic stenosis. FINDINGS  Left Ventricle: Left ventricular ejection fraction, by estimation, is 55 to 60%. The left ventricle has normal function. The left ventricle has no regional wall motion abnormalities. The left ventricular internal cavity size was normal in size. There is  moderate left ventricular hypertrophy. Left ventricular diastolic parameters are consistent with Grade I diastolic dysfunction (impaired relaxation). Elevated left atrial pressure. Right Ventricle: Not able to assess, patient not able to cooperate with IVC assessment. The right ventricular size is normal. Right vetricular wall thickness was not well visualized. Right ventricular systolic function is normal. Left Atrium: Left atrial size was normal in size. Right Atrium: Right atrial size was normal in size. Pericardium: There is no evidence of pericardial effusion. Mitral Valve: The mitral valve is normal in structure. No evidence of mitral valve regurgitation. No evidence of mitral valve stenosis. Tricuspid Valve: The tricuspid valve is abnormal. Tricuspid valve regurgitation is mild . No evidence of tricuspid stenosis. Aortic Valve: The aortic valve has an indeterminant number of cusps. There is moderate calcification of the aortic valve. There is moderate thickening of the aortic valve. There is moderate aortic valve annular calcification. Aortic valve regurgitation is moderate. Aortic regurgitation PHT measures 281 msec. Aortic valve  sclerosis/calcification is present, without any evidence of aortic stenosis. Aortic valve mean gradient measures 5.0 mmHg. Aortic valve peak gradient measures 10.1 mmHg. Aortic valve area, by VTI measures 2.36 cm. Pulmonic Valve: The pulmonic valve was not well visualized. Pulmonic valve regurgitation is not visualized. No evidence of pulmonic stenosis. Aorta: The aortic root is normal in size and structure. Venous: The inferior vena cava was not well visualized. IAS/Shunts: No atrial level shunt detected by color flow Doppler.  LEFT VENTRICLE PLAX 2D LVIDd:         3.70 cm   Diastology LVIDs:         2.60 cm   LV e' medial:    5.11 cm/s LV PW:         1.30 cm   LV E/e' medial:  19.8 LV IVS:        1.20 cm   LV e' lateral:   6.74 cm/s LVOT diam:     1.80 cm   LV E/e' lateral: 15.0 LV SV:         64 LV SV Index:   49 LVOT Area:     2.54 cm  RIGHT VENTRICLE RV S prime:     12.45 cm/s TAPSE (M-mode): 1.7 cm LEFT ATRIUM             Index        RIGHT ATRIUM  Index LA diam:        2.75 cm 2.12 cm/m   RA Area:     14.70 cm LA Vol (A2C):   43.7 ml 33.70 ml/m  RA Volume:   35.90 ml  27.68 ml/m LA Vol (A4C):   23.9 ml 18.43 ml/m LA Biplane Vol: 32.8 ml 25.29 ml/m  AORTIC VALVE AV Area (Vmax):    2.35 cm AV Area (Vmean):   2.28 cm AV Area (VTI):     2.36 cm AV Vmax:           159.00 cm/s AV Vmean:          104.000 cm/s AV VTI:            0.271 m AV Peak Grad:      10.1 mmHg AV Mean Grad:      5.0 mmHg LVOT Vmax:         147.00 cm/s LVOT Vmean:        93.100 cm/s LVOT VTI:          0.251 m LVOT/AV VTI ratio: 0.93 AI PHT:            281 msec  AORTA Ao Root diam: 3.30 cm Ao Asc diam:  3.30 cm MITRAL VALVE                TRICUSPID VALVE MV Area (PHT): 4.15 cm     TR Peak grad:   26.4 mmHg MV Decel Time: 183 msec     TR Vmax:        257.00 cm/s MV E velocity: 101.00 cm/s MV A velocity: 148.00 cm/s  SHUNTS MV E/A ratio:  0.68         Systemic VTI:  0.25 m                             Systemic Diam: 1.80 cm  Dina Rich MD Electronically signed by Dina Rich MD Signature Date/Time: 06/02/2023/3:32:10 PM    Final    DG Chest Port 1 View Result Date: 06/02/2023 CLINICAL DATA:  Shortness of breath and COPD. EXAM: PORTABLE CHEST 1 VIEW COMPARISON:  05/30/2023 FINDINGS: Stable cardiomediastinal contours. Aortic atherosclerosis. Lungs appear hyperinflated. Coarsened interstitial markings. Pulmonary vascular congestion without frank edema. No airspace consolidation, atelectasis or pneumothorax. Visualized osseous structures are intact. IMPRESSION: 1. Pulmonary vascular congestion without frank edema. 2. COPD. Electronically Signed   By: Signa Kell M.D.   On: 06/02/2023 06:52   DG Chest Port 1 View Result Date: 05/30/2023 CLINICAL DATA:  Worsening shortness of breath. History of COPD. Sepsis workup. EXAM: PORTABLE CHEST 1 VIEW COMPARISON:  Portable chest 07/03/2022 FINDINGS: The heart is slightly enlarged. There is increased central vascular prominence. Interval new mild interstitial edema in both lung bases with trace pleural effusions. The lungs are emphysematous with no focal pulmonary consolidation. Findings consistent with mild CHF or fluid overload. The mediastinum is stable. The aorta is heavily calcified. Osteopenia and thoracic spondylosis. No new osseous finding. Multiple overlying monitor wires. IMPRESSION: 1. Findings consistent with mild CHF or fluid overload. 2. Emphysema.  No focal consolidation. 3. Aortic atherosclerosis. Electronically Signed   By: Almira Bar M.D.   On: 05/30/2023 23:08    [employer] Trinidad Ingle, 55 minutes critical care time spent spent in seeing evaluate patient, reviewing labs, Meds, electronic medical records.   [facility]  If 7PM-7AM, please contact night-coverage [email or web address] Password [facility] 06/04/2023, 12:40 PM  LOS: 5 days

## 2023-06-04 NOTE — Progress Notes (Signed)
 Date and time results received: 06/04/23  1647 (use smartphrase ".now" to insert current time)  Test: Lactic acid  Critical Value: 2.2  Name of Provider Notified: Dr. Flossie Dibble  Orders Received? Or Actions Taken?:  No new orders currently.

## 2023-06-04 NOTE — Consult Note (Signed)
 Consultation Note Date: 06/04/2023   Patient Name: Carla [name]  DOB: [birth year]  MRN: [id number]  Age / Sex: 78 y.o., female  PCP: Patient, [name] Referring Physician: Kendell Bane, MD  Reason for Consultation: Establishing goals of care  HPI/Patient Profile: 78 y.o. female  with past medical history of dementia, COPD, tobacco abuse, hypertension, anxiety, hyperlipidemia, and primary hyperparathyroidism admitted on 05/30/2023 with acute respiratory failure with hypoxia and hypercarbia secondary to COPD exacerbation with RSV.   Clinical Assessment and Goals of Care: Face-to-face discussion with bedside nursing staff related to patient condition, needs, goals of care.  I have reviewed medical records including EPIC notes, labs and imaging, received report from RN, assessed the patient.  Mrs. Beltran is lying quietly in bed.  She is resting quietly.  She has known dementia with agitation therefore I do not wake her to ask orientation questions.  I do not believe that she can make her basic needs known based on discussion with bedside nursing staff.  There is no family at bedside at this time.  Call to spouse, Kanesha Cadle, no answer, left voicemail message.  Call to son, Elishia Kaczorowski.  No answer, mailbox full unable to leave message.  Conference with attending, bedside nursing staff, transition of care team related to patient condition, needs, goals of care, disposition.   HCPOA  NEXT OF KIN -husband, Ishita Mcnerney.  2 sons, Loraine Leriche and Marcial Pacas.  Reportedly son Loraine Leriche lives in the home.    SUMMARY OF RECOMMENDATIONS   At this point continue full scope/full code by default Time for outcomes PMT attempting to reach family for goals of care Would recommend DNR at least, would benefit from comfort care   Code Status/Advance Care Planning: Full code -by default, unable to have goals of  care/CODE STATUS options with family  Symptom Management:  Per hospitalist, no additional needs at this time.  Palliative Prophylaxis:  Oral Care and Turn Reposition  Additional Recommendations (Limitations, Scope, Preferences): Full Scope Treatment  Psycho-social/Spiritual:  Desire for further Chaplaincy support:no Additional Recommendations: Caregiving  Support/Resources and Education on Hospice  Prognosis:  Unable to determine, based on outcomes.  Guarded at this point.  Discharge Planning: To Be Determined      Primary Diagnoses: Present on Admission:  Acute on chronic respiratory failure with hypoxia (HCC)  Tobacco abuse  Malnutrition of moderate degree (HCC)  Memory loss  Essential hypertension   I have reviewed the medical record, interviewed the patient and family, and examined the patient. The following aspects are pertinent.  Past Medical History:  Diagnosis Date   Anxiety    ASCVD (arteriosclerotic cardiovascular disease)    No critical disease on cath in 8/97: mild AI with trival, if any l AS; negative stress nuclear in 2010   Atrophic vaginitis 04/18/2016   Back pain    Cervical disc herniation    Chronic bronchitis    Colon polyps    De Quervain's disease (radial styloid tenosynovitis) 10/14/2016   Dementia (HCC) 06/10/2020  Depression    Elevated hemoglobin A1c    Fasting hyperglycemia    GERD (gastroesophageal reflux disease)    Heart disease    HSV (herpes simplex virus) infection    Hydronephrosis    Hypercalcemia 12/30/2008   Qualifier: Diagnosis of  By: Lillia Mountain LPN, Brandi     Hypercholesteremia    Hypertension    Neck muscle spasm 04/23/2014   Onychomycosis 07/08/2015   Osteoporosis    Peripheral vascular disease (HCC)    stauts post aortabifemoral graft     Secondary erythrocytosis 08/05/2012   Secondary to COPD   Tobacco abuse    has stopped   Western blot positive HSV2 02/12/2015   Social History   Socioeconomic History   Marital  status: Married    Spouse name: Not on file   Number of children: 3   Years of education: Not on file   Highest education level: Not on file  Occupational History   Occupation: disabled   Tobacco Use   Smoking status: Every Day    Current packs/day: 0.00    Average packs/day: 0.5 packs/day for 50.0 years (25.0 ttl pk-yrs)    Types: Cigarettes    Start date: 12/11/1960    Last attempt to quit: 12/12/2010    Years since quitting: 12.4   Smokeless tobacco: Never  Vaping Use   Vaping status: Never Used  Substance and Sexual Activity   Alcohol use: No   Drug use: No   Sexual activity: Not Currently    Birth control/protection: Surgical  Other Topics Concern   Not on file  Social History Narrative   2 Children living 1 deceased    Social Drivers of Health   Financial Resource Strain: Medium Risk (02/24/2020)   Overall Financial Resource Strain (CARDIA)    Difficulty of Paying Living Expenses: Somewhat hard  Food Insecurity: Patient Unable To Answer (05/30/2023)   Hunger Vital Sign    Worried About Running Out of Food in the Last Year: Patient unable to answer    Ran Out of Food in the Last Year: Patient unable to answer  Transportation Needs: Patient Unable To Answer (05/30/2023)   PRAPARE - Transportation    Lack of Transportation (Medical): Patient unable to answer    Lack of Transportation (Non-Medical): Patient unable to answer  Physical Activity: Insufficiently Active (02/24/2020)   Exercise Vital Sign    Days of Exercise per Week: 3 days    Minutes of Exercise per Session: 30 min  Stress: No Stress Concern Present (02/24/2020)   [employer] of Occupational Health - Occupational Stress Questionnaire    Feeling of Stress : Only a little  Social Connections: Patient Unable To Answer (05/30/2023)   Social Connection and Isolation Panel [NHANES]    Frequency of Communication with Friends and Family: Patient unable to answer    Frequency of Social Gatherings with Friends and  Family: Patient unable to answer    Attends Religious Services: Patient unable to answer    Active Member of Clubs or Organizations: Patient unable to answer    Attends Banker Meetings: Patient unable to answer    Marital Status: Patient unable to answer   Family History  Problem Relation Age of Onset   Heart attack Father    COPD Sister    Heart disease Sister    Diabetes Sister    Coronary artery disease Sister    Liver cancer Sister 53   Diabetes Sister    Diabetes Sister  COPD Sister    Heart attack Mother    COPD Brother    Heart attack Son    Colon cancer Neg Hx    Scheduled Meds:  amLODipine  5 mg Oral Daily   arformoterol  15 mcg Nebulization BID   budesonide (PULMICORT) nebulizer solution  0.5 mg Nebulization BID   Chlorhexidine Gluconate Cloth  6 each Topical Daily   enoxaparin (LOVENOX) injection  30 mg Subcutaneous Q24H   ipratropium  0.5 mg Nebulization Q6H   levalbuterol  0.63 mg Nebulization Q6H   methylPREDNISolone (SOLU-MEDROL) injection  60 mg Intravenous Q12H   mouth rinse  15 mL Mouth Rinse 4 times per day   revefenacin  175 mcg Nebulization Daily   Continuous Infusions:  azithromycin     PRN Meds:.acetaminophen **OR** acetaminophen, hydrALAZINE, LORazepam, nicotine, ondansetron **OR** ondansetron (ZOFRAN) IV, mouth rinse Medications Prior to Admission:  Prior to Admission medications   Medication Sig Start Date End Date Taking? Authorizing Provider  amLODipine (NORVASC) 5 MG tablet Take 5 mg by mouth daily.   Yes [provider]  ASHWAGANDHA PO Take 1 capsule by mouth at bedtime.   Yes [provider]  gabapentin (NEURONTIN) 100 MG capsule TAKE (2) CAPSULES BY MOUTH AT BEDTIME. Patient taking differently: Take 100 mg by mouth 2 (two) times daily. 05/30/21  Yes Kerri Perches, MD  mirtazapine (REMERON) 15 MG tablet Take 1 tablet (15 mg total) by mouth at bedtime. 05/04/21  Yes Kerri Perches, MD   Allergies   Allergen Reactions   Aricept Palma Holter Hcl] Diarrhea   Calcium-Containing Compounds Other (See Comments)    Pt has hyperparathyroidism which results in hypercalcemia   Exelon [Rivastigmine Tartrate] Rash   Exelon [Rivastigmine] Rash   Review of Systems  Unable to perform ROS: Acuity of condition    Physical Exam Vitals and nursing note reviewed.  Constitutional:      General: She is not in acute distress.    Appearance: She is ill-appearing.  Cardiovascular:     Rate and Rhythm: Tachycardia present.  Pulmonary:     Effort: Pulmonary effort is normal. No tachypnea.  Skin:    General: Skin is warm and dry.     Findings: Ecchymosis present.  Neurological:     Comments: Known dementia, do not wake for orientation questions  Psychiatric:     Comments: Resting comfortably, calm but with periods of agitation     Vital Signs: BP (!) 164/75   Pulse 97   Temp 97.7 F (36.5 C) (Axillary)   Resp (!) 31   Ht 5' (1.524 m)   Wt 41.7 kg   SpO2 100%   BMI 17.95 kg/m  Pain Scale: CPOT POSS *See Group Information*: S-Acceptable,Sleep, easy to arouse Pain Score: Asleep   SpO2: SpO2: 100 % O2 Device:SpO2: 100 % O2 Flow Rate: .O2 Flow Rate (L/min): 3 L/min  IO: Intake/output summary:  Intake/Output Summary (Last 24 hours) at 06/04/2023 0944 Last data filed at 06/04/2023 0543 Gross per 24 hour  Intake 1004.08 ml  Output 950 ml  Net 54.08 ml    LBM: Last BM Date : 06/01/23 Baseline Weight: Weight: 40.8 kg Most recent weight: Weight: 41.7 kg     Palliative Assessment/Data:     Time In: 1300   Time Out: 1355 Time Total: 55 minutes  Greater than 50%  of this time was spent counseling and coordinating care related to the above assessment and plan.  Signed by: Katheran Awe, NP  Please contact Palliative Medicine Team phone at [phone] for questions and concerns.  For individual provider: See Loretha Stapler

## 2023-06-05 ENCOUNTER — Inpatient Hospital Stay (HOSPITAL_COMMUNITY)

## 2023-06-05 DIAGNOSIS — J9621 Acute and chronic respiratory failure with hypoxia: Secondary | ICD-10-CM | POA: Diagnosis not present

## 2023-06-05 LAB — CBC
HCT: 48.4 % — ABNORMAL HIGH (ref 36.0–46.0)
Hemoglobin: 15.2 g/dL — ABNORMAL HIGH (ref 12.0–15.0)
MCH: 29.7 pg (ref 26.0–34.0)
MCHC: 31.4 g/dL (ref 30.0–36.0)
MCV: 94.5 fL (ref 80.0–100.0)
Platelets: 241 10*3/uL (ref 150–400)
RBC: 5.12 MIL/uL — ABNORMAL HIGH (ref 3.87–5.11)
RDW: 14.8 % (ref 11.5–15.5)
WBC: 10.2 10*3/uL (ref 4.0–10.5)
nRBC: 0 % (ref 0.0–0.2)

## 2023-06-05 LAB — GLUCOSE, CAPILLARY
Glucose-Capillary: 226 mg/dL — ABNORMAL HIGH (ref 70–99)
Glucose-Capillary: 146 mg/dL — ABNORMAL HIGH (ref 70–99)

## 2023-06-05 LAB — BLOOD GAS, ARTERIAL
Acid-Base Excess: 19.1 mmol/L — ABNORMAL HIGH (ref 0.0–2.0)
Bicarbonate: 43.2 mmol/L — ABNORMAL HIGH (ref 20.0–28.0)
Drawn by: 28340
O2 Saturation: 93.6 %
Patient temperature: 36.3
pCO2 arterial: 44 mmHg (ref 32–48)
pH, Arterial: 7.6 — ABNORMAL HIGH (ref 7.35–7.45)
pO2, Arterial: 58 mmHg — ABNORMAL LOW (ref 83–108)

## 2023-06-05 LAB — BASIC METABOLIC PANEL
Anion gap: 7 (ref 5–15)
BUN: 29 mg/dL — ABNORMAL HIGH (ref 8–23)
CO2: 36 mmol/L — ABNORMAL HIGH (ref 22–32)
Calcium: 9.7 mg/dL (ref 8.9–10.3)
Chloride: 103 mmol/L (ref 98–111)
Creatinine, Ser: 0.84 mg/dL (ref 0.44–1.00)
GFR, Estimated: >60 mL/min (ref >60)
Glucose, Bld: 170 mg/dL — ABNORMAL HIGH (ref 70–99)
Potassium: 4.4 mmol/L (ref 3.5–5.1)
Sodium: 146 mmol/L — ABNORMAL HIGH (ref 135–145)

## 2023-06-05 LAB — BRAIN NATRIURETIC PEPTIDE: B Natriuretic Peptide: 477 pg/mL — ABNORMAL HIGH (ref 0.0–100.0)

## 2023-06-05 MED ORDER — METOPROLOL TARTRATE 5 MG/5ML IV SOLN
5.0000 mg | Freq: Four times a day (QID) | INTRAVENOUS | Status: DC | PRN
  Administered 2023-06-06: 5 mg via INTRAVENOUS
  Filled 2023-06-05: qty 5

## 2023-06-05 MED ORDER — LORAZEPAM 2 MG/ML IJ SOLN: 0.5000 mg | Freq: Four times a day (QID) | INTRAMUSCULAR | Status: DC | PRN

## 2023-06-05 MED ORDER — METOPROLOL SUCCINATE ER 50 MG PO TB24
50.0000 mg | ORAL_TABLET | Freq: Every day | ORAL | Status: DC
  Administered 2023-06-05 – 2023-06-06 (×2): 50 mg via ORAL
  Filled 2023-06-05 (×2): qty 1

## 2023-06-05 MED ORDER — AMLODIPINE BESYLATE 5 MG PO TABS
10.0000 mg | ORAL_TABLET | Freq: Every day | ORAL | Status: DC
  Administered 2023-06-06: 10 mg via ORAL
  Filled 2023-06-05 (×2): qty 2

## 2023-06-05 MED ORDER — INSULIN ASPART 100 UNIT/ML IJ SOLN
0.0000 [IU] | Freq: Three times a day (TID) | INTRAMUSCULAR | Status: DC
  Administered 2023-06-05: 2 [IU] via SUBCUTANEOUS
  Administered 2023-06-06: 1 [IU] via SUBCUTANEOUS
  Administered 2023-06-06: 3 [IU] via SUBCUTANEOUS
  Administered 2023-06-07: 1 [IU] via SUBCUTANEOUS
  Administered 2023-06-07: 3 [IU] via SUBCUTANEOUS
  Administered 2023-06-08: 1 [IU] via SUBCUTANEOUS
  Administered 2023-06-09: 2 [IU] via SUBCUTANEOUS
  Administered 2023-06-10: 1 [IU] via SUBCUTANEOUS
  Administered 2023-06-10: 2 [IU] via SUBCUTANEOUS
  Administered 2023-06-11: 1 [IU] via SUBCUTANEOUS
  Administered 2023-06-11: 2 [IU] via SUBCUTANEOUS
  Administered 2023-06-13: 1 [IU] via SUBCUTANEOUS

## 2023-06-05 MED ORDER — DEXTROSE-SODIUM CHLORIDE 5-0.45 % IV SOLN: INTRAVENOUS | Status: AC

## 2023-06-05 MED ORDER — LEVALBUTEROL HCL 0.63 MG/3ML IN NEBU: 0.6300 mg | INHALATION_SOLUTION | Freq: Four times a day (QID) | RESPIRATORY_TRACT | Status: DC | PRN

## 2023-06-05 MED ORDER — MIRTAZAPINE 30 MG PO TBDP
30.0000 mg | ORAL_TABLET | Freq: Every day | ORAL | Status: DC
  Administered 2023-06-05 – 2023-06-12 (×7): 30 mg via ORAL
  Filled 2023-06-05 (×9): qty 1

## 2023-06-05 NOTE — Progress Notes (Addendum)
 PROGRESS NOTE  Carla [name] ZOX:[id number] DOB: 1945/07/08 DOA: 05/30/2023 PCP: Patient, [name]  Subjective:  The patient was seen and examined this morning, mentation improved from yesterday from agitation. Still confused open her eyes to pain stimuli, withdraws. Does not involve in verbal communication does not follow command Moving her extremities in bed  Tmax 98, pulse 110, RR 24, BP 160/84 No major issues overnight, responded to as needed morphine Remained off BiPAP overnight    Brief History:  Carla [name] 78 year old female with a history of dementia, COPD, tobacco abuse, hypertension, anxiety, hyperlipidemia, and primary hyperparathyroidism presenting with 3-day history of shortness of breath.  The patient is unable to provide history at this time secondary to being on BiPAP and receiving some hypnotic medications.  History is obtained from review of the medical record and speaking with the patient's family.  At baseline, the patient is pleasantly confused and able to perform her activities of daily living.  The patient continues to smoke 2 packs/day.  Aside from shortness of breath, family states that there has not been any particular complaints.  She has had a nonproductive cough.  There is not been any fevers, vomiting, diarrhea, abdominal pain.  The patient has had a few loose stools without hematochezia or melena.  Because of worsening shortness of breath, the patient was brought to the emergency department for further evaluation and treatment.  Notably, the patient's son states that the patient does not take any of her medications as directed nor use her inhalers as directed at home. In the ED, the patient was afebrile and hemodynamically stable.  Oxygen saturation was in the 60s on room air.  The patient was placed on BiPAP.  WBC 10.5, hemoglobin 18.6, platelets 190.  Sodium 139, potassium 3.2, bicarbonate 25, serum creatinine 1.07.  LFTs were unremarkable.   Chest x-ray showed increased interstitial markings and hyperinflation.  COVID-19 PCR was negative.  RSV was positive.  Lactic acid 2.6>> 1.5.  PCT 0.42.  EKG shows sinus rhythm and nonspecific T wave changes.  VBG showed 7.3/50 select/60/27.  The patient was started on Solu-Medrol and bronchodilators.   Assessment/Plan:  Acute respiratory failure with hypoxia and hypercarbia Much improved from respiratory status  Still confused, less agitated -Off BiPAP overnight currently on 2 L of oxygen, satting 95%  -Secondary to COPD exacerbation in the setting of RSV infection -Has been off and on BiPAP since admission-off BiPAP x 12 hours now   -06/02/23 @0530 --increase WOB>>placed back on BiPAP -Initial VBG 7.3/56/60/27 -3/10 chest x-ray--hyperinflation, slight increase interstitial markings -06/02/23--updated son--FULL CODE for now-- Dr. Arbutus Leas discussed concerns regarding her dementia, frailty, poor baseline pulm function if she required intubation/MV>>he will discuss with family regarding wishes -06/02/23--ativan 0.5mg , morphine 1mg  given; lasix 20 mg IV, hydralazine 10 mg; now on BiPAP   -12/05/2023 currently on 3 L of oxygen, satting 93% -agitated, as needed Haldol, Ativan added  COPD exacerbation -Acute hypoxic respiratory failure -off BiPAP, weaning down oxygen -Continue Pulmicort -Continue Brovana -Continue IV Solu-Medrol -Continue DuoNebs -PCT 0.42   RSV pneumonitis -Supportive care   Toxic metabolic encephalopathy  -Agitation improved, remained confused-does not follow command Hemodynamically stable, moving extremities in bed spontaneously -Hypoxia improving, no signs of sepsis -Will try to obtain MRI of the head to rule out acute CVA   tobacco abuse -Tobacco cessation discussed   Major neurocognitive disorder -Patient is at risk for hospital delirium and clinical decompensation  Hypokalemia -Replete -check mag 2.7  Hypophosphatemia -replete    Tachyarrhythmia   -Scheduled IV metoprolol--heart rate less than 110 If tolerating p.o., will switch to p.o. metoprolol   prognosis remain poor-due to multiple comorbidities and acute respiratory failure. Palliative care consulted Continue discussion with family regarding CODE STATUS, and goals of care     Family Communication:   son updated 3/10  Consultants:  none  Code Status:  FULL   DVT Prophylaxis:  [place] Lovenox   Procedures: As Listed in Progress Note Above  Antibiotics: Ceftriaxone 3/7>> Azithro 3/7>>       Objective: Vitals:   06/05/23 1030 06/05/23 1100 06/05/23 1130 06/05/23 1131  BP: (!) 192/77 (!) 154/47 (!) 160/84   Pulse: 86 93 90   Resp: (!) 21 (!) 25 (!) 24   Temp:    98 F (36.7 C)  TempSrc:    Axillary  SpO2: 98% 96% 95%   Weight:      Height:        Intake/Output Summary (Last 24 hours) at 06/05/2023 1304 Last data filed at 06/05/2023 1229 Gross per 24 hour  Intake 370.22 ml  Output 900 ml  Net -529.78 ml   Weight change: -0.3 kg Exam:        General:  More awake today, response to pain stimuli, still nonverbal  HEENT:  Normocephalic, PERRL, otherwise with in Normal limits   Neuro:  Limited exam, -not communicating verbally, response to pain stimuli  Moving upper and lower extremities spontaneously in bed  Lungs:   Clear to auscultation BL, Respirations unlabored,  No wheezes / crackles  Cardio:    S1/S2, RRR, No murmure, No Rubs or Gallops   Abdomen:  Soft, non-tender, bowel sounds active all four quadrants, no guarding or peritoneal signs.  Muscular  skeletal:  Limited exam -global generalized weaknesses - in bed, able to move all 4 extremities,   2+ pulses,  symmetric, No pitting edema  Skin:  Dry, warm to touch, negative for any Rashes,  Wounds: Please see nursing documentation           Data Reviewed: I have personally reviewed following labs and imaging studies Basic Metabolic Panel: Recent Labs  Lab 05/31/23 0345  06/01/23 0604 06/02/23 0436 06/03/23 0431 06/05/23 0432  NA 139 144 145 146* 146*  K 3.2* 4.1 3.7 4.4 4.4  CL 101 109 107 105 103  CO2 25 25 30 30  36*  GLUCOSE 238* 132* 195* 186* 170*  BUN 38* 42* 31* 32* 29*  CREATININE 1.27* 1.15* 0.80 0.98 0.84  CALCIUM 9.1 10.0 9.8 9.6 9.7  MG  --  2.7* 2.3 2.5*  --   PHOS  --  2.7 1.6* 4.0  --    Liver Function Tests: Recent Labs  Lab 05/30/23 1945  AST 29  ALT 16  ALKPHOS 113  BILITOT 1.0  PROT 8.5*  ALBUMIN 3.9   No results for input(s): "LIPASE", "AMYLASE" in the last 168 hours. No results for input(s): "AMMONIA" in the last 168 hours. Coagulation Profile: Recent Labs  Lab 05/30/23 1945  INR 1.1   CBC: Recent Labs  Lab 05/30/23 1945 05/31/23 0345 06/02/23 0436 06/05/23 0432  WBC 10.5 8.0 19.8* 10.2  NEUTROABS 9.2*  --   --   --   HGB 18.6* 15.4* 14.4 15.2*  HCT 58.1* 49.4* 46.3* 48.4*  MCV 94.6 95.4 96.5 94.5  PLT 243 190 228 241    HbA1C: No results for input(s): "HGBA1C" in the  last 72 hours.  Urine analysis:    Component Value Date/Time   COLORURINE AMBER (A) 05/31/2023 0607   APPEARANCEUR CLOUDY (A) 05/31/2023 0607   LABSPEC 1.018 05/31/2023 0607   PHURINE 5.0 05/31/2023 0607   GLUCOSEU NEGATIVE 05/31/2023 0607   HGBUR MODERATE (A) 05/31/2023 0607   BILIRUBINUR NEGATIVE 05/31/2023 0607   BILIRUBINUR negative 08/11/2019 1432   BILIRUBINUR small 06/02/2019 1539   KETONESUR NEGATIVE 05/31/2023 0607   PROTEINUR >=300 (A) 05/31/2023 0607   UROBILINOGEN 0.2 08/11/2019 1432   UROBILINOGEN 0.2 07/07/2013 1550   NITRITE NEGATIVE 05/31/2023 0607   LEUKOCYTESUR NEGATIVE 05/31/2023 1610   Sepsis Labs: @LABRCNTIP (procalcitonin:4,lacticidven:4) ) Recent Results (from the past 240 hours)  Resp panel by RT-PCR (RSV, Flu A&B, Covid) Anterior Nasal Swab     Status: Abnormal   Collection Time: 05/30/23  7:45 PM   Specimen: Anterior Nasal Swab  Result Value Ref Range Status   SARS Coronavirus 2 by RT PCR  NEGATIVE NEGATIVE Final    Comment: (NOTE) SARS-CoV-2 target nucleic acids are NOT DETECTED.  The SARS-CoV-2 RNA is generally detectable in upper respiratory specimens during the acute phase of infection. The lowest concentration of SARS-CoV-2 viral copies this assay can detect is 138 copies/mL. A negative result does not preclude SARS-Cov-2 infection and should not be used as the sole basis for treatment or other patient management decisions. A negative result may occur with  improper specimen collection/handling, submission of specimen other than nasopharyngeal swab, presence of viral mutation(s) within the areas targeted by this assay, and inadequate number of viral copies(<138 copies/mL). A negative result must be combined with clinical observations, patient history, and epidemiological information. The expected result is Negative.  Fact Sheet for Patients:  BloggerCourse.com  Fact Sheet for Healthcare Providers:  SeriousBroker.it  This test is no t yet approved or cleared by the [place] FDA and  has been authorized for detection and/or diagnosis of SARS-CoV-2 by FDA under an Emergency Use Authorization (EUA). This EUA will remain  in effect (meaning this test can be used) for the duration of the COVID-19 declaration under Section 564(b)(1) of the Act, 21 U.S.C.section 360bbb-3(b)(1), unless the authorization is terminated  or revoked sooner.       Influenza A by PCR NEGATIVE NEGATIVE Final   Influenza B by PCR NEGATIVE NEGATIVE Final    Comment: (NOTE) The Xpert Xpress SARS-CoV-2/FLU/RSV plus assay is intended as an aid in the diagnosis of influenza from Nasopharyngeal swab specimens and should not be used as a sole basis for treatment. Nasal washings and aspirates are unacceptable for Xpert Xpress SARS-CoV-2/FLU/RSV testing.  Fact Sheet for Patients: BloggerCourse.com  Fact Sheet for  Healthcare Providers: SeriousBroker.it  This test is not yet approved or cleared by the [place] FDA and has been authorized for detection and/or diagnosis of SARS-CoV-2 by FDA under an Emergency Use Authorization (EUA). This EUA will remain in effect (meaning this test can be used) for the duration of the COVID-19 declaration under Section 564(b)(1) of the Act, 21 U.S.C. section 360bbb-3(b)(1), unless the authorization is terminated or revoked.     Resp Syncytial Virus by PCR POSITIVE (A) NEGATIVE Final    Comment: (NOTE) Fact Sheet for Patients: BloggerCourse.com  Fact Sheet for Healthcare Providers: SeriousBroker.it  This test is not yet approved or cleared by the [place] FDA and has been authorized for detection and/or diagnosis of SARS-CoV-2 by FDA under an Emergency Use Authorization (EUA). This EUA will remain in effect (meaning this test can  be used) for the duration of the COVID-19 declaration under Section 564(b)(1) of the Act, 21 U.S.C. section 360bbb-3(b)(1), unless the authorization is terminated or revoked.  Performed at [facility], [address], [place], [place] [address]   Blood Culture (routine x 2)     Status: None   Collection Time: 05/30/23  7:45 PM   Specimen: BLOOD  Result Value Ref Range Status   Specimen Description BLOOD BLOOD LEFT ARM AC  Final   Special Requests BOTTLES DRAWN AEROBIC AND ANAEROBIC  Final   Culture   Final    NO GROWTH 5 DAYS Performed at [facility], [address] [place], [place] [address]    Report Status 06/04/2023 FINAL  Final  Blood Culture (routine x 2)     Status: None   Collection Time: 05/30/23  8:00 PM   Specimen: BLOOD  Result Value Ref Range Status   Specimen Description BLOOD BLOOD RIGHT HAND  Final   Special Requests BOTTLES DRAWN AEROBIC AND ANAEROBIC  Final   Culture   Final    NO GROWTH 5 DAYS Performed at [facility], [address] [place], [place] [address]    Report Status 06/04/2023 FINAL  Final  MRSA Next Gen by PCR, Nasal     Status: None   Collection Time: 05/30/23  9:47 PM   Specimen: Nasal Mucosa; Nasal Swab  Result Value Ref Range Status   MRSA by PCR Next Gen NOT DETECTED NOT DETECTED Final    Comment: (NOTE) The GeneXpert MRSA Assay (FDA approved for NASAL specimens only), is one component of a comprehensive MRSA colonization surveillance program. It is not intended to diagnose MRSA infection nor to guide or monitor treatment for MRSA infections. Test performance is not FDA approved in patients less than 76 years old. Performed at [facility], [address], [place], [place] [address]      Scheduled Meds:  amLODipine  5 mg Oral Daily   arformoterol  15 mcg Nebulization BID   budesonide (PULMICORT) nebulizer solution  0.5 mg Nebulization BID   Chlorhexidine Gluconate Cloth  6 each Topical Daily   enoxaparin (LOVENOX) injection  30 mg Subcutaneous Q24H   ipratropium  0.5 mg Nebulization Q6H   levalbuterol  0.63 mg Nebulization Q6H   methylPREDNISolone (SOLU-MEDROL) injection  60 mg Intravenous Q12H   metoprolol tartrate  5 mg Intravenous Q6H   mouth rinse  15 mL Mouth Rinse 4 times per day   revefenacin  175 mcg Nebulization Daily   Continuous Infusions:  azithromycin Stopped (06/04/23 2102)    Procedures/Studies: DG CHEST PORT 1 VIEW Result Date: 06/05/2023 CLINICAL DATA:  Shortness of breath. EXAM: PORTABLE CHEST 1 VIEW COMPARISON:  06/02/2023 FINDINGS: Lung bases are excluded from the field of view. Aortic atherosclerotic calcifications. Stable cardiomediastinal contours. The lungs are hyperinflated with coarsened interstitial markings noted bilaterally. No signs of pleural effusion, interstitial edema or airspace disease. The visualized osseous structures appear intact. IMPRESSION: 1. No acute cardiopulmonary abnormalities. 2. COPD. 3. Aortic Atherosclerosis (ICD10-I70.0).  Electronically Signed   By: Signa Kell M.D.   On: 06/05/2023 06:40   ECHOCARDIOGRAM COMPLETE Result Date: 06/02/2023    ECHOCARDIOGRAM REPORT   Patient Name:   Carla [name] Date of Exam: 06/02/2023 Medical Rec #:  [id number]        Height:       60.0 in Accession #:    [id number]       Weight:       84.9  lb Date of Birth:  1945/05/09        BSA:          1.297 m Patient Age:    27 years         BP:           171/74 mmHg Patient Gender: F                HR:           96 bpm. [facility] Procedure: 2D Echo, Cardiac Doppler and Color Doppler (Both Spectral and Color            Flow Doppler were utilized during procedure). Indications:    Congestive Heart Failure I50.9  History:        Patient has prior history of Echocardiogram examinations, most                 recent 06/14/2022. COPD; Risk Factors:Hypertension, Current                 Smoker, Dyslipidemia and Diabetes. RSV (respiratory syncytial                 virus pneumonia), Acute on chronic respiratory failure with                 hypoxia (HCC), Dementia.  Sonographer:    Celesta Gentile RCS Referring Phys: [phone] DAVID TAT IMPRESSIONS  1. Left ventricular ejection fraction, by estimation, is 55 to 60%. The left ventricle has normal function. The left ventricle has no regional wall motion abnormalities. There is moderate left ventricular hypertrophy. Left ventricular diastolic parameters are consistent with Grade I diastolic dysfunction (impaired relaxation). Elevated left atrial pressure.  2. Right ventricular systolic function is normal. The right ventricular size is normal.  3. The mitral valve is normal in structure. No evidence of mitral valve regurgitation. No evidence of mitral stenosis.  4. The tricuspid valve is abnormal.  5. The aortic valve has an indeterminant number of cusps. There is moderate calcification of the aortic valve. There is moderate thickening of the aortic valve. Aortic valve regurgitation is moderate. Aortic valve  sclerosis/calcification is present, without any evidence of aortic stenosis. FINDINGS  Left Ventricle: Left ventricular ejection fraction, by estimation, is 55 to 60%. The left ventricle has normal function. The left ventricle has no regional wall motion abnormalities. The left ventricular internal cavity size was normal in size. There is  moderate left ventricular hypertrophy. Left ventricular diastolic parameters are consistent with Grade I diastolic dysfunction (impaired relaxation). Elevated left atrial pressure. Right Ventricle: Not able to assess, patient not able to cooperate with IVC assessment. The right ventricular size is normal. Right vetricular wall thickness was not well visualized. Right ventricular systolic function is normal. Left Atrium: Left atrial size was normal in size. Right Atrium: Right atrial size was normal in size. Pericardium: There is no evidence of pericardial effusion. Mitral Valve: The mitral valve is normal in structure. No evidence of mitral valve regurgitation. No evidence of mitral valve stenosis. Tricuspid Valve: The tricuspid valve is abnormal. Tricuspid valve regurgitation is mild . No evidence of tricuspid stenosis. Aortic Valve: The aortic valve has an indeterminant number of cusps. There is moderate calcification of the aortic valve. There is moderate thickening of the aortic valve. There is moderate aortic valve annular calcification. Aortic valve regurgitation is moderate. Aortic regurgitation PHT measures 281 msec. Aortic valve sclerosis/calcification is present, without any evidence of aortic stenosis. Aortic valve  mean gradient measures 5.0 mmHg. Aortic valve peak gradient measures 10.1 mmHg. Aortic valve area, by VTI measures 2.36 cm. Pulmonic Valve: The pulmonic valve was not well visualized. Pulmonic valve regurgitation is not visualized. No evidence of pulmonic stenosis. Aorta: The aortic root is normal in size and structure. Venous: The inferior vena cava was not  well visualized. IAS/Shunts: No atrial level shunt detected by color flow Doppler.  LEFT VENTRICLE PLAX 2D LVIDd:         3.70 cm   Diastology LVIDs:         2.60 cm   LV e' medial:    5.11 cm/s LV PW:         1.30 cm   LV E/e' medial:  19.8 LV IVS:        1.20 cm   LV e' lateral:   6.74 cm/s LVOT diam:     1.80 cm   LV E/e' lateral: 15.0 LV SV:         64 LV SV Index:   49 LVOT Area:     2.54 cm  RIGHT VENTRICLE RV S prime:     12.45 cm/s TAPSE (M-mode): 1.7 cm LEFT ATRIUM             Index        RIGHT ATRIUM           Index LA diam:        2.75 cm 2.12 cm/m   RA Area:     14.70 cm LA Vol (A2C):   43.7 ml 33.70 ml/m  RA Volume:   35.90 ml  27.68 ml/m LA Vol (A4C):   23.9 ml 18.43 ml/m LA Biplane Vol: 32.8 ml 25.29 ml/m  AORTIC VALVE AV Area (Vmax):    2.35 cm AV Area (Vmean):   2.28 cm AV Area (VTI):     2.36 cm AV Vmax:           159.00 cm/s AV Vmean:          104.000 cm/s AV VTI:            0.271 m AV Peak Grad:      10.1 mmHg AV Mean Grad:      5.0 mmHg LVOT Vmax:         147.00 cm/s LVOT Vmean:        93.100 cm/s LVOT VTI:          0.251 m LVOT/AV VTI ratio: 0.93 AI PHT:            281 msec  AORTA Ao Root diam: 3.30 cm Ao Asc diam:  3.30 cm MITRAL VALVE                TRICUSPID VALVE MV Area (PHT): 4.15 cm     TR Peak grad:   26.4 mmHg MV Decel Time: 183 msec     TR Vmax:        257.00 cm/s MV E velocity: 101.00 cm/s MV A velocity: 148.00 cm/s  SHUNTS MV E/A ratio:  0.68         Systemic VTI:  0.25 m                             Systemic Diam: 1.80 cm Dina Rich MD Electronically signed by Dina Rich MD Signature Date/Time: 06/02/2023/3:32:10 PM    Final    DG Chest Port 1 View Result Date: 06/02/2023 CLINICAL  DATA:  Shortness of breath and COPD. EXAM: PORTABLE CHEST 1 VIEW COMPARISON:  05/30/2023 FINDINGS: Stable cardiomediastinal contours. Aortic atherosclerosis. Lungs appear hyperinflated. Coarsened interstitial markings. Pulmonary vascular congestion without frank edema. No airspace  consolidation, atelectasis or pneumothorax. Visualized osseous structures are intact. IMPRESSION: 1. Pulmonary vascular congestion without frank edema. 2. COPD. Electronically Signed   By: Signa Kell M.D.   On: 06/02/2023 06:52   DG Chest Port 1 View Result Date: 05/30/2023 CLINICAL DATA:  Worsening shortness of breath. History of COPD. Sepsis workup. EXAM: PORTABLE CHEST 1 VIEW COMPARISON:  Portable chest 07/03/2022 FINDINGS: The heart is slightly enlarged. There is increased central vascular prominence. Interval new mild interstitial edema in both lung bases with trace pleural effusions. The lungs are emphysematous with no focal pulmonary consolidation. Findings consistent with mild CHF or fluid overload. The mediastinum is stable. The aorta is heavily calcified. Osteopenia and thoracic spondylosis. No new osseous finding. Multiple overlying monitor wires. IMPRESSION: 1. Findings consistent with mild CHF or fluid overload. 2. Emphysema.  No focal consolidation. 3. Aortic atherosclerosis. Electronically Signed   By: Almira Bar M.D.   On: 05/30/2023 23:08    Jemell Town A Dondi Burandt, 55 minutes critical care time spent spent in seeing evaluate patient, reviewing labs, Meds, electronic medical records.   [facility]  If 7PM-7AM, please contact night-coverage [email or web address] Password [email or web address] 06/05/2023, 1:04 PM   LOS: 6 days

## 2023-06-05 NOTE — TOC Initial Note (Addendum)
 Transition of Care [facility]) - Initial/Assessment Note    Patient Details  Name: Carla [name] MRN: [id number] Date of Birth: [birth year]  Transition of Care [facility]) CM/SW Contact:    Villa Herb, LCSWA Phone Number: 06/05/2023, 11:04 AM  Clinical Narrative:                 Pt is high risk for readmission. CSW notes per chart review that pt lives with her spouse and son in the home. Pt is currently on BiPAP as needed. Palliative is following case as well. CSW continues to follow with treatment teams for updates. TOC to follow for discharge needs.   Addendum 1pm: CSW updated that PT is recommending SNF for pt at D/C. CSW reached out to pts spouse, unable to make contact at this time. Secure voicemail left requesting return call when able. TOC to follow.   Addendum 3pm: CSW spoke with pts spouse to inquire about interest in SNF placement once pt is medically stable. Pts spouse states that he is agreeable if it is being recommended. CSW to complete referral and send out for review. TOC to follow.   Expected Discharge Plan: Home/Self Care Barriers to Discharge: Continued Medical Work up   Patient Goals and CMS Choice Patient states their goals for this hospitalization and ongoing recovery are:: get better CMS Medicare.gov Compare Post Acute Care list provided to:: Patient Represenative (must comment) Choice offered to / list presented to : Spouse      Expected Discharge Plan and Services In-house Referral: Clinical Social Work Discharge Planning Services: CM Consult   Living arrangements for the past 2 months: Single Family Home                                      Prior Living Arrangements/Services Living arrangements for the past 2 months: Single Family Home Lives with:: Spouse, Adult Children Patient language and need for interpreter reviewed:: Yes Do you feel safe going back to the place where you live?: Yes      Need for Family Participation in Patient Care: Yes  (Comment) Care giver support system in place?: Yes (comment)   Criminal Activity/Legal Involvement Pertinent to Current Situation/Hospitalization: No - Comment as needed  Activities of Daily Living   ADL Screening (condition at time of admission) Independently performs ADLs?: No Does the patient have a NEW difficulty with bathing/dressing/toileting/self-feeding that is expected to last >3 days?: No Does the patient have a NEW difficulty with getting in/out of bed, walking, or climbing stairs that is expected to last >3 days?: No Does the patient have a NEW difficulty with communication that is expected to last >3 days?: No Is the patient deaf or have difficulty hearing?: No Does the patient have difficulty seeing, even when wearing glasses/contacts?: No Does the patient have difficulty concentrating, remembering, or making decisions?: Yes  Permission Sought/Granted                  Emotional Assessment         Alcohol / Substance Use: Not Applicable Psych Involvement: No (comment)  Admission diagnosis:  Acute respiratory failure with hypoxia (HCC) [J96.01] Acute on chronic respiratory failure with hypoxia (HCC) [J96.21] Patient Active Problem List   Diagnosis Date Noted   Acute respiratory failure with hypoxia and hypercapnia (HCC) 05/31/2023   Major neurocognitive disorder (HCC) 05/31/2023   RSV (respiratory syncytial virus pneumonia) 05/31/2023  Acute on chronic respiratory failure with hypoxia (HCC) 05/30/2023   COPD with acute exacerbation (HCC) 05/30/2023   Hyponatremia 07/13/2022   Pressure injury of skin 07/13/2022   Fever 07/05/2022   Acute respiratory failure with hypercapnia (HCC) 07/05/2022   AKI (acute kidney injury) (HCC) 07/04/2022   Hypernatremia 07/04/2022   Encephalopathy acute 07/03/2022   Tracheostomy in place [facility]) 07/03/2022   Urinary retention 07/02/2022   Hypervolemia 07/02/2022   Protein-calorie malnutrition, severe (HCC) 06/22/2022    Tobacco abuse 06/12/2022   Anxiety 06/12/2022   Peripheral vascular disease (HCC) 06/12/2022   Dementia (HCC) 07/28/2020   Pain in both lower extremities 08/11/2019   Elevated hemoglobin (HCC) 08/11/2019   Memory loss 08/11/2019   Anorexia 08/11/2019   Serrated adenoma of colon 05/19/2019   Weight loss, abnormal 04/26/2019   Malnutrition of moderate degree (HCC) 01/25/2019   Nicotine addiction 01/25/2019   Prediabetes 01/19/2019   Shortness of breath 09/21/2018   Anxiety attack 09/21/2018   Generalized weakness 09/21/2018   GAD (generalized anxiety disorder) 02/13/2014   Bilateral lower abdominal pain 07/07/2013   Seasonal allergies 03/30/2013   Cervical neck pain with evidence of disc disease 11/03/2012   Hearing loss 04/13/2012   Carotid bruit 04/13/2012   Back pain with radiation 10/15/2011   Emphysema with chronic bronchitis (HCC) 01/17/2011   Arteriosclerotic cardiovascular disease (ASCVD) 09/17/2009   PERIPHERAL VASCULAR DISEASE 09/17/2009   GASTROESOPHAGEAL REFLUX DISEASE 09/17/2009   Primary hyperparathyroidism (HCC) 01/20/2009   Vitamin D deficiency 01/20/2009   Hypercalcemia 12/30/2008   Hyperlipidemia LDL goal <100 06/12/2007   Depression 06/12/2007   Essential hypertension 06/12/2007   Osteoporosis 06/12/2007   PCP:  Patient, [name] Pharmacy:   [facility] [place] - [place], [place] - [address] [place] [address] Phone: [phone] Fax: [phone]     Social Drivers of Health (SDOH) Social History: SDOH Screenings   Food Insecurity: Patient Unable To Answer (05/30/2023)  Housing: Patient Unable To Answer (05/30/2023)  Transportation Needs: Patient Unable To Answer (05/30/2023)  Utilities: Patient Unable To Answer (05/30/2023)  Alcohol Screen: Low Risk  (02/24/2020)  Depression (PHQ2-9): Low Risk  (04/05/2020)  Financial Resource Strain: Medium Risk (02/24/2020)  Physical Activity: Insufficiently Active (02/24/2020)  Social  Connections: Patient Unable To Answer (05/30/2023)  Stress: No Stress Concern Present (02/24/2020)  Tobacco Use: High Risk (06/04/2023)   SDOH Interventions:     Readmission Risk Interventions    06/05/2023   11:03 AM 07/22/2022    1:43 PM  Readmission Risk Prevention Plan  Transportation Screening Complete Complete  HRI or Home Care Consult Complete   Social Work Consult for Recovery Care Planning/Counseling Complete   Palliative Care Screening Complete Complete  Medication Review [employer]) Complete

## 2023-06-05 NOTE — Plan of Care (Signed)
  Problem: Education: Goal: Knowledge of General Education information will improve Description: Including pain rating scale, medication(s)/side effects and non-pharmacologic comfort measures Outcome: Progressing   Problem: Health Behavior/Discharge Planning: Goal: Ability to manage health-related needs will improve Outcome: Progressing   Problem: Clinical Measurements: Goal: Ability to maintain clinical measurements within normal limits will improve Outcome: Progressing Goal: Will remain free from infection Outcome: Progressing Goal: Diagnostic test results will improve Outcome: Progressing Goal: Respiratory complications will improve Outcome: Progressing Goal: Cardiovascular complication will be avoided Outcome: Progressing   Problem: Activity: Goal: Risk for activity intolerance will decrease Outcome: Progressing   Problem: Nutrition: Goal: Adequate nutrition will be maintained Outcome: Progressing   Problem: Coping: Goal: Level of anxiety will decrease Outcome: Progressing   Problem: Elimination: Goal: Will not experience complications related to bowel motility Outcome: Progressing Goal: Will not experience complications related to urinary retention Outcome: Progressing   Problem: Pain Managment: Goal: General experience of comfort will improve and/or be controlled Outcome: Progressing   Problem: Safety: Goal: Ability to remain free from injury will improve Outcome: Progressing   Problem: Skin Integrity: Goal: Risk for impaired skin integrity will decrease Outcome: Progressing   Problem: Activity: Goal: Ability to tolerate increased activity will improve Outcome: Progressing Goal: Will verbalize the importance of balancing activity with adequate rest periods Outcome: Progressing   Problem: Education: Goal: Knowledge of disease or condition will improve Outcome: Progressing Goal: Knowledge of the prescribed therapeutic regimen will improve Outcome:  Progressing Goal: Individualized Educational Video(s) Outcome: Progressing   Problem: Respiratory: Goal: Ability to maintain a clear airway will improve Outcome: Progressing Goal: Levels of oxygenation will improve Outcome: Progressing Goal: Ability to maintain adequate ventilation will improve Outcome: Progressing   Problem: Activity: Goal: Ability to tolerate increased activity will improve Outcome: Progressing   Problem: Clinical Measurements: Goal: Ability to maintain a body temperature in the normal range will improve Outcome: Progressing   Problem: Respiratory: Goal: Ability to maintain adequate ventilation will improve Outcome: Progressing Goal: Ability to maintain a clear airway will improve Outcome: Progressing   Problem: Safety: Goal: Non-violent Restraint(s) Outcome: Progressing

## 2023-06-05 NOTE — Plan of Care (Signed)
  Problem: Acute Rehab PT Goals(only PT should resolve) Goal: Pt Will Go Supine/Side To Sit Outcome: Progressing Flowsheets (Taken 06/05/2023 1430) Pt will go Supine/Side to Sit:  with contact guard assist  with minimal assist Goal: Patient Will Transfer Sit To/From Stand Outcome: Progressing Flowsheets (Taken 06/05/2023 1430) Patient will transfer sit to/from stand:  with minimal assist  with contact guard assist Goal: Pt Will Transfer Bed To Chair/Chair To Bed Outcome: Progressing Flowsheets (Taken 06/05/2023 1430) Pt will Transfer Bed to Chair/Chair to Bed: with min assist Goal: Pt Will Ambulate Outcome: Progressing Flowsheets (Taken 06/05/2023 1430) Pt will Ambulate:  25 feet  with minimal assist  with rolling walker  with moderate assist   2:30 PM, 06/05/23 Ocie Bob, MPT Physical Therapist with [facility] 336 [phone] office [phone] mobile phone

## 2023-06-05 NOTE — [place] FL2 (Signed)
 [place] MEDICAID FL2 LEVEL OF CARE FORM     IDENTIFICATION  Patient Name: Carla [name] Birthdate: 12-Jun-1945 Sex: female Admission Date (Current Location): 05/30/2023  [facility] and [place] Number:  [employer] and Address:  [facility],  [address] [place] [address]      Provider Number: [id number]  Attending Physician Name and Address:  Kendell Bane, MD  Relative Name and Phone Number:       Current Level of Care: Hospital Recommended Level of Care: Skilled Nursing Facility Prior Approval Number:    Date Approved/Denied:   PASRR Number: [id number]  Discharge Plan: SNF    Current Diagnoses: Patient Active Problem List   Diagnosis Date Noted   Acute respiratory failure with hypoxia and hypercapnia (HCC) 05/31/2023   Major neurocognitive disorder (HCC) 05/31/2023   RSV (respiratory syncytial virus pneumonia) 05/31/2023   Acute on chronic respiratory failure with hypoxia (HCC) 05/30/2023   COPD with acute exacerbation (HCC) 05/30/2023   Hyponatremia 07/13/2022   Pressure injury of skin 07/13/2022   Fever 07/05/2022   Acute respiratory failure with hypercapnia (HCC) 07/05/2022   AKI (acute kidney injury) (HCC) 07/04/2022   Hypernatremia 07/04/2022   Encephalopathy acute 07/03/2022   Tracheostomy in place [facility]) 07/03/2022   Urinary retention 07/02/2022   Hypervolemia 07/02/2022   Protein-calorie malnutrition, severe (HCC) 06/22/2022   Tobacco abuse 06/12/2022   Anxiety 06/12/2022   Peripheral vascular disease (HCC) 06/12/2022   Dementia (HCC) 07/28/2020   Pain in both lower extremities 08/11/2019   Elevated hemoglobin (HCC) 08/11/2019   Memory loss 08/11/2019   Anorexia 08/11/2019   Serrated adenoma of colon 05/19/2019   Weight loss, abnormal 04/26/2019   Malnutrition of moderate degree (HCC) 01/25/2019   Nicotine addiction 01/25/2019   Prediabetes 01/19/2019   Shortness of breath 09/21/2018   Anxiety attack 09/21/2018    Generalized weakness 09/21/2018   GAD (generalized anxiety disorder) 02/13/2014   Bilateral lower abdominal pain 07/07/2013   Seasonal allergies 03/30/2013   Cervical neck pain with evidence of disc disease 11/03/2012   Hearing loss 04/13/2012   Carotid bruit 04/13/2012   Back pain with radiation 10/15/2011   Emphysema with chronic bronchitis (HCC) 01/17/2011   Arteriosclerotic cardiovascular disease (ASCVD) 09/17/2009   PERIPHERAL VASCULAR DISEASE 09/17/2009   GASTROESOPHAGEAL REFLUX DISEASE 09/17/2009   Primary hyperparathyroidism (HCC) 01/20/2009   Vitamin D deficiency 01/20/2009   Hypercalcemia 12/30/2008   Hyperlipidemia LDL goal <100 06/12/2007   Depression 06/12/2007   Essential hypertension 06/12/2007   Osteoporosis 06/12/2007    Orientation RESPIRATION BLADDER Height & Weight     Self  O2 (2L) Incontinent Weight: 91 lb 4.3 oz (41.4 kg) Height:  5' (152.4 cm)  BEHAVIORAL SYMPTOMS/MOOD NEUROLOGICAL BOWEL NUTRITION STATUS      Continent Diet (Heart healthy)  AMBULATORY STATUS COMMUNICATION OF NEEDS Skin   Extensive Assist Verbally Normal                       Personal Care Assistance Level of Assistance  Bathing, Dressing, Feeding Bathing Assistance: Limited assistance Feeding assistance: Independent Dressing Assistance: Limited assistance     Functional Limitations Info  Sight, Hearing, Speech Sight Info: Adequate Hearing Info: Adequate Speech Info: Adequate    SPECIAL CARE FACTORS FREQUENCY  PT (By licensed PT), OT (By licensed OT)     PT Frequency: 5 times weekly OT Frequency: 5 times weekly            Contractures Contractures  Info: Not present    Additional Factors Info  Code Status, Allergies Code Status Info: FULL Allergies Info: Aricept (Donepezil Hcl), Calcium-containing Compounds, Exelon (Rivastigmine Tartrate), Exelon (Rivastigmine)           Current Medications (06/05/2023):  This is the current hospital active medication  list Current Facility-Administered Medications  Medication Dose Route Frequency Provider Last Rate Last Admin   acetaminophen (TYLENOL) tablet 650 mg  650 mg Oral Q6H PRN Levie Heritage, DO       Or   acetaminophen (TYLENOL) suppository 650 mg  650 mg Rectal Q6H PRN Levie Heritage, DO       [START ON 06/06/2023] amLODipine (NORVASC) tablet 10 mg  10 mg Oral Daily Shahmehdi, Seyed A, MD       arformoterol (BROVANA) nebulizer solution 15 mcg  15 mcg Nebulization BID Tat, Onalee Hua, MD   15 mcg at 06/05/23 1610   azithromycin (ZITHROMAX) 500 mg in sodium chloride 0.9 % 250 mL IVPB  500 mg Intravenous Q24H Nevin Bloodgood A, MD   Stopped at 06/04/23 2102   budesonide (PULMICORT) nebulizer solution 0.5 mg  0.5 mg Nebulization BID Tat, Onalee Hua, MD   0.5 mg at 06/05/23 9604   Chlorhexidine Gluconate Cloth 2 % PADS 6 each  6 each Topical Daily Levie Heritage, DO   6 each at 06/05/23 0833   dextrose 5 % and 0.45 % NaCl infusion   Intravenous Continuous Nevin Bloodgood A, MD 50 mL/hr at 06/05/23 1502 Infusion Verify at 06/05/23 1502   enoxaparin (LOVENOX) injection 30 mg  30 mg Subcutaneous Q24H Levie Heritage, DO   30 mg at 06/05/23 0818   haloperidol lactate (HALDOL) injection 2 mg  2 mg Intravenous Q6H PRN Nevin Bloodgood A, MD   2 mg at 06/04/23 1740   hydrALAZINE (APRESOLINE) injection 10 mg  10 mg Intravenous Q6H PRN Adefeso, Oladapo, DO   10 mg at 06/05/23 1052   insulin aspart (novoLOG) injection 0-6 Units  0-6 Units Subcutaneous TID WC Shahmehdi, Seyed A, MD       ipratropium (ATROVENT) nebulizer solution 0.5 mg  0.5 mg Nebulization Q6H Shahmehdi, Seyed A, MD   0.5 mg at 06/05/23 1456   levalbuterol (XOPENEX) nebulizer solution 0.63 mg  0.63 mg Nebulization Q6H Shahmehdi, Seyed A, MD   0.63 mg at 06/05/23 1455   methylPREDNISolone sodium succinate (SOLU-MEDROL) 125 mg/2 mL injection 60 mg  60 mg Intravenous Pablo Ledger, MD   60 mg at 06/05/23 5409   metoprolol succinate (TOPROL-XL) 24 hr  tablet 50 mg  50 mg Oral Daily Shahmehdi, Seyed A, MD   50 mg at 06/05/23 1442   metoprolol tartrate (LOPRESSOR) injection 5 mg  5 mg Intravenous Q6H PRN Shahmehdi, Seyed A, MD       mirtazapine (REMERON SOL-TAB) disintegrating tablet 30 mg  30 mg Oral QHS Shahmehdi, Seyed A, MD       morphine (PF) 2 MG/ML injection 1 mg  1 mg Intravenous Q3H PRN Shahmehdi, Seyed A, MD       nicotine (NICODERM CQ - dosed in mg/24 hours) patch 21 mg  21 mg Transdermal Daily PRN Levie Heritage, DO       ondansetron [facility]) tablet 4 mg  4 mg Oral Q6H PRN Levie Heritage, DO       Or   ondansetron (ZOFRAN) injection 4 mg  4 mg Intravenous Q6H PRN Levie Heritage, DO       Oral care  mouth rinse  15 mL Mouth Rinse 4 times per day Levie Heritage, DO   15 mL at 06/05/23 1259   Oral care mouth rinse  15 mL Mouth Rinse PRN Levie Heritage, DO       revefenacin (YUPELRI) nebulizer solution 175 mcg  175 mcg Nebulization Daily Tat, Onalee Hua, MD   175 mcg at 06/05/23 1660     Discharge Medications: Please see discharge summary for a list of discharge medications.  Relevant Imaging Results:  Relevant Lab Results:   Additional Information SS# [id number]  Villa Herb, [place]

## 2023-06-05 NOTE — Evaluation (Signed)
 Physical Therapy Evaluation Patient Details Name: Carla [name] MRN: [id number] DOB: February 18, 1946 Today's Date: 06/05/2023  History of Present Illness  Carla [name] is a 78 y.o. female with medical history significant of dementia, hypertension, COPD, history of respiratory failure with extensive intubation, tracheostomy back in May 2024.  She presents to the hospital via EMS due to hypoxia and difficulty breathing.  Unfortunately the patient is unable to give much history due to dementia.  She does not know how long she has been short of breath.  She primarily asks for [place] to let her smoke a cigarette.  Her family had seen her and called EMS.  On their arrival she was severely hypertensive, tachycardic, tachypneic and hypoxic to 60% on room air.  She was immediately placed on a nonrebreather and brought to the hospital for evaluation.   Clinical Impression  Patient demonstrates slow labored movement for sitting up at bedside, requires repeated verbal/tactile cueing for following directions, very unsteady on feet and limited to a few side steps before having to sit due to BLE weakness. Patient tolerated sitting up in chair after therapy - nursing staff notified. Patient will benefit from continued skilled physical therapy in hospital and recommended venue below to increase strength, balance, endurance for safe ADLs and gait.           If plan is discharge home, recommend the following: A lot of help with bathing/dressing/bathroom;A lot of help with walking and/or transfers;Help with stairs or ramp for entrance;Assistance with cooking/housework   Can travel by private vehicle   No    Equipment Recommendations Rolling walker (2 wheels)  Recommendations for Other Services       Functional Status Assessment Patient has had a recent decline in their functional status and demonstrates the ability to make significant improvements in function in a reasonable and predictable amount of time.      Precautions / Restrictions Precautions Precautions: Fall Restrictions Weight Bearing Restrictions Per Provider Order: No      Mobility  Bed Mobility Overal bed mobility: Needs Assistance Bed Mobility: Supine to Sit     Supine to sit: Mod assist     General bed mobility comments: slow labored movement    Transfers Overall transfer level: Needs assistance Equipment used: Rolling walker (2 wheels) Transfers: Sit to/from Stand, Bed to chair/wheelchair/BSC Sit to Stand: Mod assist   Step pivot transfers: Mod assist       General transfer comment: poor standing balance due to weakness    Ambulation/Gait Ambulation/Gait assistance: Mod assist, Max assist Gait Distance (Feet): 4 Feet Assistive device: Rolling walker (2 wheels) Gait Pattern/deviations: Decreased step length - right, Decreased step length - left, Decreased stride length, Knees buckling Gait velocity: slow     General Gait Details: limited to a few slow labored side steps with buckling of knees due to weakness  Stairs            Wheelchair Mobility     Tilt Bed    Modified Rankin (Stroke Patients Only)       Balance Overall balance assessment: Needs assistance Sitting-balance support: Feet supported, No upper extremity supported Sitting balance-Leahy Scale: Poor Sitting balance - Comments: fair/poor seated at EOB   Standing balance support: During functional activity, No upper extremity supported Standing balance-Leahy Scale: Poor Standing balance comment: fair/poor using RW  Pertinent Vitals/Pain Pain Assessment Pain Assessment: No/denies pain    Home Living Family/patient expects to be discharged to:: Private residence Living Arrangements: Children;Spouse/significant other Available Help at Discharge: Available 24 hours/day;Family Type of Home: House Home Access: Stairs to enter   Secretary/administrator of Steps: 1   Home Layout: Two  level;Bed/bath upstairs Home Equipment: None Additional Comments: Information taken from previous admission due to patient is poor historian    Prior Function Prior Level of Function : Independent/Modified Independent             Mobility Comments: household ambulation without AD, "per patient' ADLs Comments: assisted by family     Extremity/Trunk Assessment   Upper Extremity Assessment Upper Extremity Assessment: Generalized weakness    Lower Extremity Assessment Lower Extremity Assessment: Generalized weakness    Cervical / Trunk Assessment Cervical / Trunk Assessment: Normal  Communication   Communication Communication: No apparent difficulties    Cognition Arousal: Alert Behavior During Therapy: Flat affect, Impulsive   PT - Cognitive impairments: History of cognitive impairments                         Following commands: Impaired Following commands impaired: Follows one step commands with increased time, Follows one step commands inconsistently     Cueing Cueing Techniques: Verbal cues, Tactile cues     General Comments      Exercises     Assessment/Plan    PT Assessment Patient needs continued PT services  PT Problem List Decreased strength;Decreased activity tolerance;Decreased balance;Decreased mobility       PT Treatment Interventions DME instruction;Gait training;Stair training;Functional mobility training;Therapeutic activities;Therapeutic exercise;Balance training;Patient/family education    PT Goals (Current goals can be found in the Care Plan section)  Acute Rehab PT Goals Patient Stated Goal: return home PT Goal Formulation: With patient Time For Goal Achievement: 06/19/23 Potential to Achieve Goals: Good    Frequency Min 3X/week     Co-evaluation               AM-PAC PT "6 Clicks" Mobility  Outcome Measure Help needed turning from your back to your side while in a flat bed without using bedrails?: A  Little Help needed moving from lying on your back to sitting on the side of a flat bed without using bedrails?: A Lot Help needed moving to and from a bed to a chair (including a wheelchair)?: A Lot Help needed standing up from a chair using your arms (e.g., wheelchair or bedside chair)?: A Lot Help needed to walk in hospital room?: A Lot Help needed climbing 3-5 steps with a railing? : A Lot 6 Click Score: 13    End of Session Equipment Utilized During Treatment: Oxygen Activity Tolerance: Patient tolerated treatment well;Patient limited by fatigue Patient left: in chair;with call bell/phone within reach;with chair alarm set Nurse Communication: Mobility status PT Visit Diagnosis: Unsteadiness on feet (R26.81);Other abnormalities of gait and mobility (R26.89);Muscle weakness (generalized) (M62.81)    Time: 1137-1206 PT Time Calculation (min) (ACUTE ONLY): 29 min   Charges:   PT Evaluation $PT Eval Moderate Complexity: 1 Mod PT Treatments $Therapeutic Activity: 23-37 mins PT General Charges $$ ACUTE PT VISIT: 1 Visit         2:28 PM, 06/05/23 Ocie Bob, MPT Physical Therapist with [facility] 336 [phone] office [phone] mobile phone

## 2023-06-06 DIAGNOSIS — J9621 Acute and chronic respiratory failure with hypoxia: Secondary | ICD-10-CM | POA: Diagnosis not present

## 2023-06-06 LAB — BASIC METABOLIC PANEL
Anion gap: 8 (ref 5–15)
BUN: 27 mg/dL — ABNORMAL HIGH (ref 8–23)
CO2: 32 mmol/L (ref 22–32)
Calcium: 9.2 mg/dL (ref 8.9–10.3)
Chloride: 100 mmol/L (ref 98–111)
Creatinine, Ser: 0.78 mg/dL (ref 0.44–1.00)
GFR, Estimated: >60 mL/min (ref >60)
Glucose, Bld: 212 mg/dL — ABNORMAL HIGH (ref 70–99)
Potassium: 3.7 mmol/L (ref 3.5–5.1)
Sodium: 140 mmol/L (ref 135–145)

## 2023-06-06 LAB — BRAIN NATRIURETIC PEPTIDE: B Natriuretic Peptide: 483 pg/mL — ABNORMAL HIGH (ref 0.0–100.0)

## 2023-06-06 LAB — CBC
HCT: 46.9 % — ABNORMAL HIGH (ref 36.0–46.0)
Hemoglobin: 14.8 g/dL (ref 12.0–15.0)
MCH: 29.4 pg (ref 26.0–34.0)
MCHC: 31.6 g/dL (ref 30.0–36.0)
MCV: 93.1 fL (ref 80.0–100.0)
Platelets: 226 10*3/uL (ref 150–400)
RBC: 5.04 MIL/uL (ref 3.87–5.11)
RDW: 14.6 % (ref 11.5–15.5)
WBC: 9.6 10*3/uL (ref 4.0–10.5)
nRBC: 0 % (ref 0.0–0.2)

## 2023-06-06 LAB — GLUCOSE, CAPILLARY
Glucose-Capillary: 165 mg/dL — ABNORMAL HIGH (ref 70–99)
Glucose-Capillary: 256 mg/dL — ABNORMAL HIGH (ref 70–99)
Glucose-Capillary: 99 mg/dL (ref 70–99)
Glucose-Capillary: 244 mg/dL — ABNORMAL HIGH (ref 70–99)

## 2023-06-06 MED ORDER — METHYLPREDNISOLONE SODIUM SUCC 40 MG IJ SOLR
40.0000 mg | Freq: Two times a day (BID) | INTRAMUSCULAR | Status: DC
  Administered 2023-06-06: 40 mg via INTRAVENOUS
  Filled 2023-06-06 (×2): qty 1

## 2023-06-06 MED ORDER — LEVALBUTEROL HCL 0.63 MG/3ML IN NEBU
0.6300 mg | INHALATION_SOLUTION | Freq: Four times a day (QID) | RESPIRATORY_TRACT | Status: DC
  Administered 2023-06-06: 0.63 mg via RESPIRATORY_TRACT
  Filled 2023-06-06: qty 3

## 2023-06-06 MED ORDER — HALOPERIDOL LACTATE 5 MG/ML IJ SOLN
1.0000 mg | Freq: Four times a day (QID) | INTRAMUSCULAR | Status: DC | PRN
  Administered 2023-06-06: 1 mg via INTRAMUSCULAR
  Filled 2023-06-06: qty 1

## 2023-06-06 MED ORDER — OLANZAPINE 10 MG IM SOLR
2.5000 mg | Freq: Once | INTRAMUSCULAR | Status: AC
  Administered 2023-06-07: 2.5 mg via INTRAMUSCULAR
  Filled 2023-06-06: qty 10

## 2023-06-06 MED ORDER — HALOPERIDOL 0.5 MG PO TABS
1.0000 mg | ORAL_TABLET | Freq: Four times a day (QID) | ORAL | Status: DC | PRN
  Administered 2023-06-07: 1 mg via ORAL
  Filled 2023-06-06: qty 2

## 2023-06-06 MED ORDER — LEVALBUTEROL HCL 0.63 MG/3ML IN NEBU
0.6300 mg | INHALATION_SOLUTION | Freq: Two times a day (BID) | RESPIRATORY_TRACT | Status: DC
  Administered 2023-06-07 – 2023-06-13 (×12): 0.63 mg via RESPIRATORY_TRACT
  Filled 2023-06-06 (×13): qty 3

## 2023-06-06 NOTE — Plan of Care (Signed)
  Problem: Acute Rehab OT Goals (only OT should resolve) Goal: Pt. Will Perform Upper Body Bathing Flowsheets (Taken 06/06/2023 1047) Pt Will Perform Upper Body Bathing: with set-up Goal: Pt. Will Perform Lower Body Dressing Flowsheets (Taken 06/06/2023 1047) Pt Will Perform Lower Body Dressing: Independently Goal: Pt. Will Perform Tub/Shower Transfer Flowsheets (Taken 06/06/2023 1047) Pt Will Perform Tub/Shower Transfer: Independently Goal: Pt/Caregiver Will Perform Home Exercise Program Flowsheets (Taken 06/06/2023 1047) Pt/caregiver will Perform Home Exercise Program:  Both right and left upper extremity  Increased ROM  Increased strength  With written HEP provided   Lurena Joiner, OTR/L

## 2023-06-06 NOTE — Evaluation (Signed)
 Occupational Therapy Evaluation Patient Details Name: Carla [name] MRN: [id number] DOB: [birth year] Today's Date: 06/06/2023   History of Present Illness   Carla [name] is a 78 y.o. female with medical history significant of dementia, hypertension, COPD, history of respiratory failure with extensive intubation, tracheostomy back in May 2024.  She presents to the hospital via EMS due to hypoxia and difficulty breathing.  Unfortunately the patient is unable to give much history due to dementia.  She does not know how long she has been short of breath.  She primarily asks for [place] to let her smoke a cigarette.  Her family had seen her and called EMS.  On their arrival she was severely hypertensive, tachycardic, tachypneic and hypoxic to 60% on room air.  She was immediately placed on a nonrebreather and brought to the hospital for evaluation.     Clinical Impressions Pt agreeable to OT/PT session. She is a poor historian therefor previous admission information was used for home set up. Pt demonstrated generalized weakness in BL UE. She completed bed mobility with modified independence and use of bed rails. She completed sit to stand t/f with min assist and completed functional mobility to toilet. Toilet t/f CGA for safety. Trialed pt off of O2 during mobility but O2 sats dropping into the 80s, O2 placed back on and bumped up to 3L. RN aware of O2 and mobility status.      If plan is discharge home, recommend the following:   A little help with walking and/or transfers;A little help with bathing/dressing/bathroom;Assistance with cooking/housework;Assist for transportation     Functional Status Assessment   Patient has had a recent decline in their functional status and demonstrates the ability to make significant improvements in function in a reasonable and predictable amount of time.     Equipment Recommendations   None recommended by OT     Recommendations for Other Services          Precautions/Restrictions   Precautions Precautions: Fall Restrictions Weight Bearing Restrictions Per Provider Order: No     Mobility Bed Mobility Overal bed mobility: Modified Independent Bed Mobility: Supine to Sit     Supine to sit: Modified independent (Device/Increase time) (use of bed rails)     General bed mobility comments: slow labored movement    Transfers Overall transfer level: Needs assistance Equipment used: Rolling walker (2 wheels) Transfers: Sit to/from Stand, Bed to chair/wheelchair/BSC Sit to Stand: Contact guard assist     Step pivot transfers: Contact guard assist     General transfer comment: impulsive      Balance Overall balance assessment: Needs assistance Sitting-balance support: Feet supported, No upper extremity supported Sitting balance-Leahy Scale: Good Sitting balance - Comments: good   Standing balance support: Reliant on assistive device for balance, Bilateral upper extremity supported Standing balance-Leahy Scale: Good Standing balance comment: fiar, impulsive                           ADL either performed or assessed with clinical judgement   ADL Overall ADL's : Needs assistance/impaired Eating/Feeding: Set up   Grooming: Set up;Sitting   Upper Body Bathing: Minimal assistance;Set up;Sitting   Lower Body Bathing: Moderate assistance;Sitting/lateral leans   Upper Body Dressing : Minimal assistance;Sitting   Lower Body Dressing: Moderate assistance;Sitting/lateral leans   Toilet Transfer: Minimal assistance;Rolling walker (2 wheels) Toilet Transfer Details (indicate cue type and reason): unsteady on feet Toileting- Clothing Manipulation and Hygiene: Set up;Sitting/lateral  lean   Tub/ Shower Transfer: Minimal assistance;Tub bench;Rolling walker (2 wheels)   Functional mobility during ADLs: Minimal assistance       Vision Baseline Vision/History: 0 No visual deficits Patient Visual Report: No  change from baseline Vision Assessment?: No apparent visual deficits            Pertinent Vitals/Pain Pain Assessment Pain Assessment: No/denies pain     Extremity/Trunk Assessment Upper Extremity Assessment Upper Extremity Assessment: Generalized weakness   Lower Extremity Assessment Lower Extremity Assessment: Defer to PT evaluation   Cervical / Trunk Assessment Cervical / Trunk Assessment: Normal   Communication Communication Communication: No apparent difficulties   Cognition Arousal: Alert Behavior During Therapy: Flat affect, Impulsive Cognition: No family/caregiver present to determine baseline             OT - Cognition Comments: pt very impulsive and does not knwo answers to home set up questions                 Following commands: Impaired Following commands impaired: Follows one step commands with increased time, Follows one step commands inconsistently     Cueing  General Comments   Cueing Techniques: Verbal cues;Tactile cues              Home Living Family/patient expects to be discharged to:: Private residence Living Arrangements: Children;Spouse/significant other Available Help at Discharge: Available 24 hours/day;Family Type of Home: House Home Access: Stairs to enter [employer] of Steps: 1   Home Layout: Two level;Bed/bath upstairs     Bathroom Shower/Tub: Chief Strategy Officer: Standard     Home Equipment: None   Additional Comments: Information taken from previous admission due to patient is poor historian      Prior Functioning/Environment Prior Level of Function : Independent/Modified Independent             Mobility Comments: household ambulation without AD, "per patient' ADLs Comments: assisted by family    OT Problem List: Decreased activity tolerance;Decreased safety awareness   OT Treatment/Interventions: Self-care/ADL training;DME and/or AE instruction;Therapeutic  exercise;Visual/perceptual remediation/compensation;Therapeutic activities;Patient/family education      OT Goals(Current goals can be found in the care plan section)   Acute Rehab OT Goals Patient Stated Goal: to go home OT Goal Formulation: Patient unable to participate in goal setting Time For Goal Achievement: 06/20/23 Potential to Achieve Goals: Good ADL Goals Pt Will Perform Upper Body Bathing: with set-up Pt Will Perform Lower Body Dressing: Independently Pt Will Perform Tub/Shower Transfer: Independently Pt/caregiver will Perform Home Exercise Program: Both right and left upper extremity;Increased ROM;Increased strength;With written HEP provided   OT Frequency:  Min 2X/week    Co-evaluation PT/OT/SLP Co-Evaluation/Treatment: Yes     OT goals addressed during session: ADL's and self-care      AM-PAC OT "6 Clicks" Daily Activity     Outcome Measure Help from another person eating meals?: A Little Help from another person taking care of personal grooming?: A Little Help from another person toileting, which includes using toliet, bedpan, or urinal?: A Little Help from another person bathing (including washing, rinsing, drying)?: A Little Help from another person to put on and taking off regular upper body clothing?: A Little Help from another person to put on and taking off regular lower body clothing?: A Little 6 Click Score: 18   End of Session Equipment Utilized During Treatment: Rolling walker (2 wheels);Oxygen Nurse Communication: Mobility status  Activity Tolerance: Patient tolerated treatment well Patient left: in chair;with  call bell/phone within reach;with chair alarm set  OT Visit Diagnosis: Unsteadiness on feet (R26.81)                Time: 4132-4401 OT Time Calculation (min): 21 min Charges:  OT General Charges $OT Visit: 1 Visit OT Evaluation $OT Eval Low Complexity: 1 Low OT Treatments $Self Care/Home Management : 8-22 mins    Bevelyn Ngo,  OTR/L 06/06/2023, 10:49 AM

## 2023-06-06 NOTE — Plan of Care (Signed)
  Problem: Education: Goal: Knowledge of General Education information will improve Description: Including pain rating scale, medication(s)/side effects and non-pharmacologic comfort measures Outcome: Progressing   Problem: Health Behavior/Discharge Planning: Goal: Ability to manage health-related needs will improve Outcome: Progressing   Problem: Clinical Measurements: Goal: Ability to maintain clinical measurements within normal limits will improve Outcome: Progressing Goal: Will remain free from infection Outcome: Progressing Goal: Diagnostic test results will improve Outcome: Progressing Goal: Respiratory complications will improve Outcome: Progressing Goal: Cardiovascular complication will be avoided Outcome: Progressing   Problem: Activity: Goal: Risk for activity intolerance will decrease Outcome: Progressing   Problem: Nutrition: Goal: Adequate nutrition will be maintained Outcome: Progressing   Problem: Coping: Goal: Level of anxiety will decrease Outcome: Progressing   Problem: Elimination: Goal: Will not experience complications related to bowel motility Outcome: Progressing Goal: Will not experience complications related to urinary retention Outcome: Progressing   Problem: Pain Managment: Goal: General experience of comfort will improve and/or be controlled Outcome: Progressing   Problem: Safety: Goal: Ability to remain free from injury will improve Outcome: Progressing   Problem: Skin Integrity: Goal: Risk for impaired skin integrity will decrease Outcome: Progressing   Problem: Education: Goal: Knowledge of disease or condition will improve Outcome: Progressing Goal: Knowledge of the prescribed therapeutic regimen will improve Outcome: Progressing Goal: Individualized Educational Video(s) Outcome: Progressing   Problem: Activity: Goal: Ability to tolerate increased activity will improve Outcome: Progressing Goal: Will verbalize the  importance of balancing activity with adequate rest periods Outcome: Progressing   Problem: Respiratory: Goal: Ability to maintain a clear airway will improve Outcome: Progressing Goal: Levels of oxygenation will improve Outcome: Progressing Goal: Ability to maintain adequate ventilation will improve Outcome: Progressing   Problem: Activity: Goal: Ability to tolerate increased activity will improve Outcome: Progressing   Problem: Clinical Measurements: Goal: Ability to maintain a body temperature in the normal range will improve Outcome: Progressing   Problem: Respiratory: Goal: Ability to maintain adequate ventilation will improve Outcome: Progressing Goal: Ability to maintain a clear airway will improve Outcome: Progressing   Problem: Safety: Goal: Non-violent Restraint(s) Outcome: Progressing   Problem: Education: Goal: Ability to describe self-care measures that may prevent or decrease complications (Diabetes Survival Skills Education) will improve Outcome: Progressing Goal: Individualized Educational Video(s) Outcome: Progressing   Problem: Coping: Goal: Ability to adjust to condition or change in health will improve Outcome: Progressing   Problem: Fluid Volume: Goal: Ability to maintain a balanced intake and output will improve Outcome: Progressing   Problem: Health Behavior/Discharge Planning: Goal: Ability to identify and utilize available resources and services will improve Outcome: Progressing Goal: Ability to manage health-related needs will improve Outcome: Progressing   Problem: Metabolic: Goal: Ability to maintain appropriate glucose levels will improve Outcome: Progressing   Problem: Nutritional: Goal: Maintenance of adequate nutrition will improve Outcome: Progressing Goal: Progress toward achieving an optimal weight will improve Outcome: Progressing   Problem: Skin Integrity: Goal: Risk for impaired skin integrity will decrease Outcome:  Progressing   Problem: Tissue Perfusion: Goal: Adequacy of tissue perfusion will improve Outcome: Progressing

## 2023-06-06 NOTE — Progress Notes (Signed)
 Physical Therapy Treatment Patient Details Name: Carla [name] MRN: [id number] DOB: [birth year] Today's Date: 06/06/2023   History of Present Illness Carla [name] is a 78 y.o. female with medical history significant of dementia, hypertension, COPD, history of respiratory failure with extensive intubation, tracheostomy back in May 2024.  She presents to the hospital via EMS due to hypoxia and difficulty breathing.  Unfortunately the patient is unable to give much history due to dementia.  She does not know how long she has been short of breath.  She primarily asks for [place] to let her smoke a cigarette.  Her family had seen her and called EMS.  On their arrival she was severely hypertensive, tachycardic, tachypneic and hypoxic to 60% on room air.  She was immediately placed on a nonrebreather and brought to the hospital for evaluation.    PT Comments  Patient demonstrates increased endurance/distance for taking steps in room, able to transfer to/from commode with labored movement, but required repeated verbal/tactile cueing for safety due to impulsive behavior. Patient had near loss of balance when taking steps without RW and required use of RW for transfers and gait training. Patient tolerated sitting up in chair after therapy - nursing staff notified. Patient will benefit from continued skilled physical therapy in hospital and recommended venue below to increase strength, balance, endurance for safe ADLs and gait.      If plan is discharge home, recommend the following: A lot of help with bathing/dressing/bathroom;A lot of help with walking and/or transfers;Help with stairs or ramp for entrance;Assistance with cooking/housework   Can travel by private vehicle     No  Equipment Recommendations  Rolling walker (2 wheels)    Recommendations for Other Services       Precautions / Restrictions Precautions Precautions: Fall Restrictions Weight Bearing Restrictions Per Provider Order: No      Mobility  Bed Mobility Overal bed mobility: Modified Independent             General bed mobility comments: demonstrated improvement for sitting up at bedside with HOB flat    Transfers Overall transfer level: Needs assistance Equipment used: Rolling walker (2 wheels) Transfers: Sit to/from Stand, Bed to chair/wheelchair/BSC Sit to Stand: Contact guard assist   Step pivot transfers: Contact guard assist, Min assist       General transfer comment: poor standing balance without AD with near loss of balance, required use of RW for safety    Ambulation/Gait Ambulation/Gait assistance: Min assist, Mod assist Gait Distance (Feet): 12 Feet Assistive device: Rolling walker (2 wheels), None Gait Pattern/deviations: Decreased step length - right, Decreased step length - left, Decreased stride length, Scissoring Gait velocity: slow     General Gait Details: unsteady labored movement with near fall when not using AD, required use of RW for ambulating to commode, limited mostly due to fatigue   Stairs             Wheelchair Mobility     Tilt Bed    Modified Rankin (Stroke Patients Only)       Balance Overall balance assessment: Needs assistance Sitting-balance support: Feet supported, No upper extremity supported Sitting balance-Leahy Scale: Good Sitting balance - Comments: seated at EOB   Standing balance support: During functional activity, No upper extremity supported Standing balance-Leahy Scale: Poor Standing balance comment: fair using RW  Communication Communication Communication: No apparent difficulties  Cognition Arousal: Alert Behavior During Therapy: Flat affect, Impulsive   PT - Cognitive impairments: History of cognitive impairments                         Following commands: Impaired Following commands impaired: Follows one step commands with increased time, Follows one step commands  inconsistently    Cueing Cueing Techniques: Verbal cues, Tactile cues  Exercises      General Comments        Pertinent Vitals/Pain Pain Assessment Pain Assessment: No/denies pain    Home Living                          Prior Function            PT Goals (current goals can now be found in the care plan section) Acute Rehab PT Goals Patient Stated Goal: return home PT Goal Formulation: With patient Time For Goal Achievement: 06/19/23 Potential to Achieve Goals: Good Progress towards PT goals: Progressing toward goals    Frequency    Min 3X/week      PT Plan      Co-evaluation PT/OT/SLP Co-Evaluation/Treatment: Yes Reason for Co-Treatment: To address functional/ADL transfers PT goals addressed during session: Mobility/safety with mobility;Balance;Proper use of DME OT goals addressed during session: ADL's and self-care      AM-PAC PT "6 Clicks" Mobility   Outcome Measure  Help needed turning from your back to your side while in a flat bed without using bedrails?: None Help needed moving from lying on your back to sitting on the side of a flat bed without using bedrails?: A Little Help needed moving to and from a bed to a chair (including a wheelchair)?: A Lot Help needed standing up from a chair using your arms (e.g., wheelchair or bedside chair)?: A Little Help needed to walk in hospital room?: A Lot Help needed climbing 3-5 steps with a railing? : A Lot 6 Click Score: 16    End of Session Equipment Utilized During Treatment: Oxygen Activity Tolerance: Patient tolerated treatment well;Patient limited by fatigue Patient left: in chair;with call bell/phone within reach;with chair alarm set Nurse Communication: Mobility status PT Visit Diagnosis: Unsteadiness on feet (R26.81);Other abnormalities of gait and mobility (R26.89);Muscle weakness (generalized) (M62.81)     Time: 1610-9604 PT Time Calculation (min) (ACUTE ONLY): 30 min  Charges:     $Therapeutic Activity: 23-37 mins PT General Charges $$ ACUTE PT VISIT: 1 Visit                     3:15 PM, 06/06/23 Ocie Bob, MPT Physical Therapist with [facility] 336 [phone] office [phone] mobile phone

## 2023-06-06 NOTE — Progress Notes (Signed)
 Patient ambulated with 2 person assist around the entire ICU.

## 2023-06-06 NOTE — Progress Notes (Signed)
 PROGRESS NOTE  ARTHURINE OLEARY UJW:[id number] DOB: September 29, 1945 DOA: 05/30/2023 PCP: Patient, [name]  Subjective:  The patient was seen and examined this morning Sleepy easily arousable, still not very interactive  Per nursing staff patient stayed off BiPAP overnight -Taine her O2 sat greater 92% on 3 L of oxygen     Brief History:  CHERA SLIVKA 78 year old female with a history of dementia, COPD, tobacco abuse, hypertension, anxiety, hyperlipidemia, and primary hyperparathyroidism presenting with 3-day history of shortness of breath.  The patient is unable to provide history at this time secondary to being on BiPAP and receiving some hypnotic medications.  History is obtained from review of the medical record and speaking with the patient's family.  At baseline, the patient is pleasantly confused and able to perform her activities of daily living.  The patient continues to smoke 2 packs/day.  Aside from shortness of breath, family states that there has not been any particular complaints.  She has had a nonproductive cough.  There is not been any fevers, vomiting, diarrhea, abdominal pain.  The patient has had a few loose stools without hematochezia or melena.  Because of worsening shortness of breath, the patient was brought to the emergency department for further evaluation and treatment.  Notably, the patient's son states that the patient does not take any of her medications as directed nor use her inhalers as directed at home. In the ED, the patient was afebrile and hemodynamically stable.  Oxygen saturation was in the 60s on room air.  The patient was placed on BiPAP.  WBC 10.5, hemoglobin 18.6, platelets 190.  Sodium 139, potassium 3.2, bicarbonate 25, serum creatinine 1.07.  LFTs were unremarkable.  Chest x-ray showed increased interstitial markings and hyperinflation.  COVID-19 PCR was negative.  RSV was positive.  Lactic acid 2.6>> 1.5.  PCT 0.42.  EKG shows sinus rhythm  and nonspecific T wave changes.  VBG showed 7.3/50 select/60/27.  The patient was started on Solu-Medrol and bronchodilators.   Assessment/Plan:  Acute respiratory failure with hypoxia and hypercarbia -Improving respiratory status -Off BiPAP x 24 hours now -Maintaining O2 sat at 92% with 3 L of oxygen  -Secondary to COPD exacerbation in the setting of RSV infection   -06/02/23 @0530 --increase WOB>>placed back on BiPAP -Initial VBG 7.3/56/60/27 -3/10 chest x-ray--hyperinflation, slight increase interstitial markings -06/02/23--updated son--FULL CODE for now-- Dr. Arbutus Leas discussed concerns regarding her dementia, frailty, poor baseline pulm function if she required intubation/MV>>he will discuss with family regarding wishes -06/02/23--ativan 0.5mg , morphine 1mg  given; lasix 20 mg IV, hydralazine 10 mg; now on BiPAP   -12/05/2023 currently on 3 L of oxygen, satting 93% -agitated, as needed Haldol, Ativan added -12/06/2023 -  -remain on 3 L of oxygen, satting 92%, during the use less of Haldol, Ativan 12/07/2023, more awake, still on 3 L of oxygen, discontinuing Haldol, and Ativan, continue as needed morphine-coercion p.o. intake    COPD exacerbation -With acute respiratory failure, off BiPAP now, on 3 L of oxygen -Continue Pulmicort -Continue Brovana -Continue IV Solu-Medrol  >> tapering down -Continue DuoNebs -PCT 0.42   RSV pneumonitis -Supportive care   Toxic metabolic encephalopathy  -Slow improvement likely due to hypoxia, infection MRI of the brain-reviewed, no acute intracranial normalities -Continue monitoring  tobacco abuse -Tobacco cessation discussed   Major neurocognitive disorder -Patient is at risk for hospital delirium and clinical decompensation -Medication was reviewed, restarting Remeron 30 mg nightly   Hypokalemia/hypophosphatemia -Replete -  check mag 2.7   Tachyarrhythmia  -Switched scheduled IV metoprolol to p.o.- Heart rate is stabilizing   prognosis  remain poor-due to multiple comorbidities and acute respiratory failure. Palliative care consulted Continue discussion with family regarding CODE STATUS, and goals of care     Family Communication:   son updated 3/10  Consultants:  none  Code Status:  FULL   DVT Prophylaxis:  Dwight Lovenox   Procedures: As Listed in Progress Note Above  Antibiotics: Ceftriaxone 3/7>> Azithro 3/7>>       Objective: Vitals:   06/06/23 0805 06/06/23 0830 06/06/23 0900 06/06/23 0930  BP:  (!) 173/72 (!) 190/73 (!) 153/75  Pulse: 90 92 100 77  Resp: 16 18 (!) 25 17  Temp: 97.9 F (36.6 C)     TempSrc: Axillary     SpO2: 93% 92%  92%  Weight:      Height:        Intake/Output Summary (Last 24 hours) at 06/06/2023 0959 Last data filed at 06/06/2023 0918 Gross per 24 hour  Intake 981.94 ml  Output --  Net 981.94 ml   Weight change:  Exam:        General:  Confused, sleepy, but arousable  HEENT:  Normocephalic, PERRL, otherwise with in Normal limits   Neuro:  CNII-XII intact. , normal motor and sensation, reflexes intact   Lungs:   Improved rhonchi, wheezing, reduced breath sounds mid to lower lobe  Cardio:    S1/S2, RRR, No murmure, No Rubs or Gallops   Abdomen:  Soft, non-tender, bowel sounds active all four quadrants, no guarding or peritoneal signs.  Muscular  skeletal:  Limited exam -global generalized weaknesses - in bed, able to move all 4 extremities,   2+ pulses,  symmetric, No pitting edema  Skin:  Dry, warm to touch, negative for any Rashes,  Wounds: Please see nursing documentation          Data Reviewed: I have personally reviewed following labs and imaging studies Basic Metabolic Panel: Recent Labs  Lab 06/01/23 0604 06/02/23 0436 06/03/23 0431 06/05/23 0432 06/06/23 0442  NA 144 145 146* 146* 140  K 4.1 3.7 4.4 4.4 3.7  CL 109 107 105 103 100  CO2 25 30 30  36* 32  GLUCOSE 132* 195* 186* 170* 212*  BUN 42* 31* 32* 29* 27*  CREATININE 1.15*  0.80 0.98 0.84 0.78  CALCIUM 10.0 9.8 9.6 9.7 9.2  MG 2.7* 2.3 2.5*  --   --   PHOS 2.7 1.6* 4.0  --   --    Liver Function Tests: Recent Labs  Lab 05/30/23 1945  AST 29  ALT 16  ALKPHOS 113  BILITOT 1.0  PROT 8.5*  ALBUMIN 3.9   No results for input(s): "LIPASE", "AMYLASE" in the last 168 hours. No results for input(s): "AMMONIA" in the last 168 hours. Coagulation Profile: Recent Labs  Lab 05/30/23 1945  INR 1.1   CBC: Recent Labs  Lab 05/30/23 1945 05/31/23 0345 06/02/23 0436 06/05/23 0432 06/06/23 0442  WBC 10.5 8.0 19.8* 10.2 9.6  NEUTROABS 9.2*  --   --   --   --   HGB 18.6* 15.4* 14.4 15.2* 14.8  HCT 58.1* 49.4* 46.3* 48.4* 46.9*  MCV 94.6 95.4 96.5 94.5 93.1  PLT 243 190 228 241 226    HbA1C: No results for input(s): "HGBA1C" in the last 72 hours.  Urine analysis:    Component Value Date/Time   COLORURINE AMBER (A) 05/31/2023 6578  APPEARANCEUR CLOUDY (A) 05/31/2023 0607   LABSPEC 1.018 05/31/2023 0607   PHURINE 5.0 05/31/2023 0607   GLUCOSEU NEGATIVE 05/31/2023 0607   HGBUR MODERATE (A) 05/31/2023 0607   BILIRUBINUR NEGATIVE 05/31/2023 0607   BILIRUBINUR negative 08/11/2019 1432   BILIRUBINUR small 06/02/2019 1539   KETONESUR NEGATIVE 05/31/2023 0607   PROTEINUR >=300 (A) 05/31/2023 0607   UROBILINOGEN 0.2 08/11/2019 1432   UROBILINOGEN 0.2 07/07/2013 1550   NITRITE NEGATIVE 05/31/2023 0607   LEUKOCYTESUR NEGATIVE 05/31/2023 0607   Sepsis Labs: @LABRCNTIP (procalcitonin:4,lacticidven:4) ) Recent Results (from the past 240 hours)  Resp panel by RT-PCR (RSV, Flu A&B, Covid) Anterior Nasal Swab     Status: Abnormal   Collection Time: 05/30/23  7:45 PM   Specimen: Anterior Nasal Swab  Result Value Ref Range Status   SARS Coronavirus 2 by RT PCR NEGATIVE NEGATIVE Final    Comment: (NOTE) SARS-CoV-2 target nucleic acids are NOT DETECTED.  The SARS-CoV-2 RNA is generally detectable in upper respiratory specimens during the acute phase of  infection. The lowest concentration of SARS-CoV-2 viral copies this assay can detect is 138 copies/mL. A negative result does not preclude SARS-Cov-2 infection and should not be used as the sole basis for treatment or other patient management decisions. A negative result may occur with  improper specimen collection/handling, submission of specimen other than nasopharyngeal swab, presence of viral mutation(s) within the areas targeted by this assay, and inadequate number of viral copies(<138 copies/mL). A negative result must be combined with clinical observations, patient history, and epidemiological information. The expected result is Negative.  Fact Sheet for Patients:  BloggerCourse.com  Fact Sheet for Healthcare Providers:  SeriousBroker.it  This test is no t yet approved or cleared by the [place] FDA and  has been authorized for detection and/or diagnosis of SARS-CoV-2 by FDA under an Emergency Use Authorization (EUA). This EUA will remain  in effect (meaning this test can be used) for the duration of the COVID-19 declaration under Section 564(b)(1) of the Act, 21 U.S.C.section 360bbb-3(b)(1), unless the authorization is terminated  or revoked sooner.       Influenza A by PCR NEGATIVE NEGATIVE Final   Influenza B by PCR NEGATIVE NEGATIVE Final    Comment: (NOTE) The Xpert Xpress SARS-CoV-2/FLU/RSV plus assay is intended as an aid in the diagnosis of influenza from Nasopharyngeal swab specimens and should not be used as a sole basis for treatment. Nasal washings and aspirates are unacceptable for Xpert Xpress SARS-CoV-2/FLU/RSV testing.  Fact Sheet for Patients: BloggerCourse.com  Fact Sheet for Healthcare Providers: SeriousBroker.it  This test is not yet approved or cleared by the [place] FDA and has been authorized for detection and/or diagnosis of SARS-CoV-2  by FDA under an Emergency Use Authorization (EUA). This EUA will remain in effect (meaning this test can be used) for the duration of the COVID-19 declaration under Section 564(b)(1) of the Act, 21 U.S.C. section 360bbb-3(b)(1), unless the authorization is terminated or revoked.     Resp Syncytial Virus by PCR POSITIVE (A) NEGATIVE Final    Comment: (NOTE) Fact Sheet for Patients: BloggerCourse.com  Fact Sheet for Healthcare Providers: SeriousBroker.it  This test is not yet approved or cleared by the [place] FDA and has been authorized for detection and/or diagnosis of SARS-CoV-2 by FDA under an Emergency Use Authorization (EUA). This EUA will remain in effect (meaning this test can be used) for the duration of the COVID-19 declaration under Section 564(b)(1) of the Act, 21 U.S.C. section 360bbb-3(b)(1), unless the  authorization is terminated or revoked.  Performed at [facility], [address], [place], [place] [address]   Blood Culture (routine x 2)     Status: None   Collection Time: 05/30/23  7:45 PM   Specimen: BLOOD  Result Value Ref Range Status   Specimen Description BLOOD BLOOD LEFT ARM AC  Final   Special Requests BOTTLES DRAWN AEROBIC AND ANAEROBIC  Final   Culture   Final    NO GROWTH 5 DAYS Performed at [facility], [address], [place], [place] [address]    Report Status 06/04/2023 FINAL  Final  Blood Culture (routine x 2)     Status: None   Collection Time: 05/30/23  8:00 PM   Specimen: BLOOD  Result Value Ref Range Status   Specimen Description BLOOD BLOOD RIGHT HAND  Final   Special Requests BOTTLES DRAWN AEROBIC AND ANAEROBIC  Final   Culture   Final    NO GROWTH 5 DAYS Performed at [facility], [address] [place], [place] [address]    Report Status 06/04/2023 FINAL  Final  MRSA Next Gen by PCR, Nasal     Status: None   Collection Time: 05/30/23  9:47 PM   Specimen: Nasal Mucosa; Nasal  Swab  Result Value Ref Range Status   MRSA by PCR Next Gen NOT DETECTED NOT DETECTED Final    Comment: (NOTE) The GeneXpert MRSA Assay (FDA approved for NASAL specimens only), is one component of a comprehensive MRSA colonization surveillance program. It is not intended to diagnose MRSA infection nor to guide or monitor treatment for MRSA infections. Test performance is not FDA approved in patients less than 73 years old. Performed at [facility], [address] [place], [place] [address]      Scheduled Meds:  amLODipine  10 mg Oral Daily   arformoterol  15 mcg Nebulization BID   budesonide (PULMICORT) nebulizer solution  0.5 mg Nebulization BID   Chlorhexidine Gluconate Cloth  6 each Topical Daily   enoxaparin (LOVENOX) injection  30 mg Subcutaneous Q24H   insulin aspart  0-6 Units Subcutaneous TID WC   levalbuterol  0.63 mg Nebulization Q6H   methylPREDNISolone (SOLU-MEDROL) injection  40 mg Intravenous Q12H   metoprolol succinate  50 mg Oral Daily   mirtazapine  30 mg Oral QHS   mouth rinse  15 mL Mouth Rinse 4 times per day   revefenacin  175 mcg Nebulization Daily   Continuous Infusions:  dextrose 5 % and 0.45 % NaCl 50 mL/hr at 06/06/23 0720    Procedures/Studies: MR BRAIN WO CONTRAST Result Date: 06/05/2023 CLINICAL DATA:  Altered mental status EXAM: MRI HEAD WITHOUT CONTRAST TECHNIQUE: Multiplanar, multiecho pulse sequences of the brain and surrounding structures were obtained without intravenous contrast. COMPARISON:  08/07/2004 FINDINGS: Brain: No acute infarct, mass effect or extra-axial collection. No acute or chronic hemorrhage. There is multifocal hyperintense T2-weighted signal within the white matter. Parenchymal volume and CSF spaces are normal. The midline structures are normal. Vascular: Normal flow voids. Skull and upper cervical spine: Normal calvarium and skull base. Visualized upper cervical spine and soft tissues are normal. Sinuses/Orbits:No acute finding  IMPRESSION: 1. No acute intracranial abnormality. 2. Findings of chronic small vessel ischemia. Electronically Signed   By: Deatra Robinson M.D.   On: 06/05/2023 20:29   DG CHEST PORT 1 VIEW Result Date: 06/05/2023 CLINICAL DATA:  Shortness of breath. EXAM: PORTABLE CHEST 1 VIEW COMPARISON:  06/02/2023 FINDINGS: Lung bases are excluded from the field  of view. Aortic atherosclerotic calcifications. Stable cardiomediastinal contours. The lungs are hyperinflated with coarsened interstitial markings noted bilaterally. No signs of pleural effusion, interstitial edema or airspace disease. The visualized osseous structures appear intact. IMPRESSION: 1. No acute cardiopulmonary abnormalities. 2. COPD. 3. Aortic Atherosclerosis (ICD10-I70.0). Electronically Signed   By: Signa Kell M.D.   On: 06/05/2023 06:40   ECHOCARDIOGRAM COMPLETE Result Date: 06/02/2023    ECHOCARDIOGRAM REPORT   Patient Name:   TAIS KOESTNER Date of Exam: 06/02/2023 Medical Rec #:  [id number]        Height:       60.0 in Accession #:    [id number]       Weight:       84.9 lb Date of Birth:  [birth year]        BSA:          1.297 m Patient Age:    78 years         BP:           171/74 mmHg Patient Gender: F                HR:           96 bpm. [facility] Procedure: 2D Echo, Cardiac Doppler and Color Doppler (Both Spectral and Color            Flow Doppler were utilized during procedure). Indications:    Congestive Heart Failure I50.9  History:        Patient has prior history of Echocardiogram examinations, most                 recent 06/14/2022. COPD; Risk Factors:Hypertension, Current                 Smoker, Dyslipidemia and Diabetes. RSV (respiratory syncytial                 virus pneumonia), Acute on chronic respiratory failure with                 hypoxia (HCC), Dementia.  Sonographer:    Celesta Gentile RCS Referring Phys: [phone] DAVID TAT IMPRESSIONS  1. Left ventricular ejection fraction, by estimation, is 55 to 60%. The left  ventricle has normal function. The left ventricle has no regional wall motion abnormalities. There is moderate left ventricular hypertrophy. Left ventricular diastolic parameters are consistent with Grade I diastolic dysfunction (impaired relaxation). Elevated left atrial pressure.  2. Right ventricular systolic function is normal. The right ventricular size is normal.  3. The mitral valve is normal in structure. No evidence of mitral valve regurgitation. No evidence of mitral stenosis.  4. The tricuspid valve is abnormal.  5. The aortic valve has an indeterminant number of cusps. There is moderate calcification of the aortic valve. There is moderate thickening of the aortic valve. Aortic valve regurgitation is moderate. Aortic valve sclerosis/calcification is present, without any evidence of aortic stenosis. FINDINGS  Left Ventricle: Left ventricular ejection fraction, by estimation, is 55 to 60%. The left ventricle has normal function. The left ventricle has no regional wall motion abnormalities. The left ventricular internal cavity size was normal in size. There is  moderate left ventricular hypertrophy. Left ventricular diastolic parameters are consistent with Grade I diastolic dysfunction (impaired relaxation). Elevated left atrial pressure. Right Ventricle: Not able to assess, patient not able to cooperate with IVC assessment. The right ventricular size is normal. Right vetricular wall thickness was not well visualized. Right ventricular systolic  function is normal. Left Atrium: Left atrial size was normal in size. Right Atrium: Right atrial size was normal in size. Pericardium: There is no evidence of pericardial effusion. Mitral Valve: The mitral valve is normal in structure. No evidence of mitral valve regurgitation. No evidence of mitral valve stenosis. Tricuspid Valve: The tricuspid valve is abnormal. Tricuspid valve regurgitation is mild . No evidence of tricuspid stenosis. Aortic Valve: The aortic valve  has an indeterminant number of cusps. There is moderate calcification of the aortic valve. There is moderate thickening of the aortic valve. There is moderate aortic valve annular calcification. Aortic valve regurgitation is moderate. Aortic regurgitation PHT measures 281 msec. Aortic valve sclerosis/calcification is present, without any evidence of aortic stenosis. Aortic valve mean gradient measures 5.0 mmHg. Aortic valve peak gradient measures 10.1 mmHg. Aortic valve area, by VTI measures 2.36 cm. Pulmonic Valve: The pulmonic valve was not well visualized. Pulmonic valve regurgitation is not visualized. No evidence of pulmonic stenosis. Aorta: The aortic root is normal in size and structure. Venous: The inferior vena cava was not well visualized. IAS/Shunts: No atrial level shunt detected by color flow Doppler.  LEFT VENTRICLE PLAX 2D LVIDd:         3.70 cm   Diastology LVIDs:         2.60 cm   LV e' medial:    5.11 cm/s LV PW:         1.30 cm   LV E/e' medial:  19.8 LV IVS:        1.20 cm   LV e' lateral:   6.74 cm/s LVOT diam:     1.80 cm   LV E/e' lateral: 15.0 LV SV:         64 LV SV Index:   49 LVOT Area:     2.54 cm  RIGHT VENTRICLE RV S prime:     12.45 cm/s TAPSE (M-mode): 1.7 cm LEFT ATRIUM             Index        RIGHT ATRIUM           Index LA diam:        2.75 cm 2.12 cm/m   RA Area:     14.70 cm LA Vol (A2C):   43.7 ml 33.70 ml/m  RA Volume:   35.90 ml  27.68 ml/m LA Vol (A4C):   23.9 ml 18.43 ml/m LA Biplane Vol: 32.8 ml 25.29 ml/m  AORTIC VALVE AV Area (Vmax):    2.35 cm AV Area (Vmean):   2.28 cm AV Area (VTI):     2.36 cm AV Vmax:           159.00 cm/s AV Vmean:          104.000 cm/s AV VTI:            0.271 m AV Peak Grad:      10.1 mmHg AV Mean Grad:      5.0 mmHg LVOT Vmax:         147.00 cm/s LVOT Vmean:        93.100 cm/s LVOT VTI:          0.251 m LVOT/AV VTI ratio: 0.93 AI PHT:            281 msec  AORTA Ao Root diam: 3.30 cm Ao Asc diam:  3.30 cm MITRAL VALVE                 TRICUSPID VALVE  MV Area (PHT): 4.15 cm     TR Peak grad:   26.4 mmHg MV Decel Time: 183 msec     TR Vmax:        257.00 cm/s MV E velocity: 101.00 cm/s MV A velocity: 148.00 cm/s  SHUNTS MV E/A ratio:  0.68         Systemic VTI:  0.25 m                             Systemic Diam: 1.80 cm Dina Rich MD Electronically signed by Dina Rich MD Signature Date/Time: 06/02/2023/3:32:10 PM    Final    DG Chest Port 1 View Result Date: 06/02/2023 CLINICAL DATA:  Shortness of breath and COPD. EXAM: PORTABLE CHEST 1 VIEW COMPARISON:  05/30/2023 FINDINGS: Stable cardiomediastinal contours. Aortic atherosclerosis. Lungs appear hyperinflated. Coarsened interstitial markings. Pulmonary vascular congestion without frank edema. No airspace consolidation, atelectasis or pneumothorax. Visualized osseous structures are intact. IMPRESSION: 1. Pulmonary vascular congestion without frank edema. 2. COPD. Electronically Signed   By: Signa Kell M.D.   On: 06/02/2023 06:52   DG Chest Port 1 View Result Date: 05/30/2023 CLINICAL DATA:  Worsening shortness of breath. History of COPD. Sepsis workup. EXAM: PORTABLE CHEST 1 VIEW COMPARISON:  Portable chest 07/03/2022 FINDINGS: The heart is slightly enlarged. There is increased central vascular prominence. Interval new mild interstitial edema in both lung bases with trace pleural effusions. The lungs are emphysematous with no focal pulmonary consolidation. Findings consistent with mild CHF or fluid overload. The mediastinum is stable. The aorta is heavily calcified. Osteopenia and thoracic spondylosis. No new osseous finding. Multiple overlying monitor wires. IMPRESSION: 1. Findings consistent with mild CHF or fluid overload. 2. Emphysema.  No focal consolidation. 3. Aortic atherosclerosis. Electronically Signed   By: Almira Bar M.D.   On: 05/30/2023 23:08    Montie Swiderski A Rithwik Schmieg, 55 minutes critical care time spent spent in seeing evaluate patient, reviewing labs, Meds,  electronic medical records.   [facility]  If 7PM-7AM, please contact night-coverage [email or web address] Password [email or web address] 06/06/2023, 9:59 AM   LOS: 7 days

## 2023-06-06 NOTE — Progress Notes (Signed)
 Patient was noted very agitated, pulling monitors and devices, despite using mittens.  She is trying to get out of the bed and not able to redirect.   Dx. COPD exacerbation complicated with acute delirium.   Will add as needed haloperidol to control her symptoms.

## 2023-06-07 DIAGNOSIS — J9621 Acute and chronic respiratory failure with hypoxia: Secondary | ICD-10-CM | POA: Diagnosis not present

## 2023-06-07 LAB — CBC
HCT: 46.7 % — ABNORMAL HIGH (ref 36.0–46.0)
Hemoglobin: 15.1 g/dL — ABNORMAL HIGH (ref 12.0–15.0)
MCH: 29.4 pg (ref 26.0–34.0)
MCHC: 32.3 g/dL (ref 30.0–36.0)
MCV: 91 fL (ref 80.0–100.0)
Platelets: 203 10*3/uL (ref 150–400)
RBC: 5.13 MIL/uL — ABNORMAL HIGH (ref 3.87–5.11)
RDW: 14 % (ref 11.5–15.5)
WBC: 10.3 10*3/uL (ref 4.0–10.5)
nRBC: 0 % (ref 0.0–0.2)

## 2023-06-07 LAB — BASIC METABOLIC PANEL
Anion gap: 8 (ref 5–15)
BUN: 26 mg/dL — ABNORMAL HIGH (ref 8–23)
CO2: 29 mmol/L (ref 22–32)
Calcium: 9.1 mg/dL (ref 8.9–10.3)
Chloride: 97 mmol/L — ABNORMAL LOW (ref 98–111)
Creatinine, Ser: 0.73 mg/dL (ref 0.44–1.00)
GFR, Estimated: >60 mL/min (ref >60)
Glucose, Bld: 212 mg/dL — ABNORMAL HIGH (ref 70–99)
Potassium: 3.5 mmol/L (ref 3.5–5.1)
Sodium: 134 mmol/L — ABNORMAL LOW (ref 135–145)

## 2023-06-07 LAB — GLUCOSE, CAPILLARY
Glucose-Capillary: 274 mg/dL — ABNORMAL HIGH (ref 70–99)
Glucose-Capillary: 79 mg/dL (ref 70–99)
Glucose-Capillary: 174 mg/dL — ABNORMAL HIGH (ref 70–99)
Glucose-Capillary: 169 mg/dL — ABNORMAL HIGH (ref 70–99)

## 2023-06-07 LAB — BRAIN NATRIURETIC PEPTIDE: B Natriuretic Peptide: 475 pg/mL — ABNORMAL HIGH (ref 0.0–100.0)

## 2023-06-07 MED ORDER — METOPROLOL SUCCINATE ER 50 MG PO TB24
100.0000 mg | ORAL_TABLET | Freq: Every day | ORAL | Status: DC
  Administered 2023-06-07 – 2023-06-08 (×2): 100 mg via ORAL
  Filled 2023-06-07 (×2): qty 2

## 2023-06-07 MED ORDER — METHYLPREDNISOLONE SODIUM SUCC 40 MG IJ SOLR
40.0000 mg | INTRAMUSCULAR | Status: DC
  Administered 2023-06-07 – 2023-06-09 (×3): 40 mg via INTRAVENOUS
  Filled 2023-06-07 (×3): qty 1

## 2023-06-07 MED ORDER — FUROSEMIDE 10 MG/ML IJ SOLN
20.0000 mg | Freq: Two times a day (BID) | INTRAMUSCULAR | Status: AC
  Administered 2023-06-07 (×2): 20 mg via INTRAVENOUS
  Filled 2023-06-07 (×2): qty 2

## 2023-06-07 MED ORDER — MORPHINE SULFATE (PF) 2 MG/ML IV SOLN: 1.0000 mg | INTRAVENOUS | Status: DC | PRN

## 2023-06-07 MED ORDER — AMLODIPINE BESYLATE 5 MG PO TABS
5.0000 mg | ORAL_TABLET | Freq: Every day | ORAL | Status: DC
  Administered 2023-06-07 – 2023-06-13 (×6): 5 mg via ORAL
  Filled 2023-06-07 (×6): qty 1

## 2023-06-07 MED ORDER — NICOTINE 7 MG/24HR TD PT24: 7.0000 mg | MEDICATED_PATCH | Freq: Every day | TRANSDERMAL | Status: DC | PRN

## 2023-06-07 MED ORDER — QUETIAPINE FUMARATE 100 MG PO TABS
100.0000 mg | ORAL_TABLET | Freq: Every day | ORAL | Status: DC
  Administered 2023-06-07: 100 mg via ORAL
  Filled 2023-06-07: qty 1

## 2023-06-07 MED ORDER — MORPHINE SULFATE (PF) 2 MG/ML IV SOLN
1.0000 mg | INTRAVENOUS | Status: DC | PRN
  Administered 2023-06-07: 1 mg via INTRAVENOUS
  Filled 2023-06-07: qty 1

## 2023-06-07 MED ORDER — ALPRAZOLAM 0.25 MG PO TABS
0.2500 mg | ORAL_TABLET | Freq: Three times a day (TID) | ORAL | Status: DC
  Administered 2023-06-07: 0.25 mg via ORAL
  Filled 2023-06-07: qty 1

## 2023-06-07 MED ORDER — NICOTINE 21 MG/24HR TD PT24
21.0000 mg | MEDICATED_PATCH | Freq: Every day | TRANSDERMAL | Status: DC | PRN
Start: 2023-06-07 — End: 2023-06-09
  Administered 2023-06-08 – 2023-06-09 (×2): 21 mg via TRANSDERMAL
  Filled 2023-06-07 (×2): qty 1

## 2023-06-07 MED ORDER — BUPROPION HCL ER (XL) 150 MG PO TB24
150.0000 mg | ORAL_TABLET | Freq: Every day | ORAL | Status: DC
  Filled 2023-06-07: qty 1

## 2023-06-07 MED ORDER — ALPRAZOLAM 0.5 MG PO TABS
0.5000 mg | ORAL_TABLET | Freq: Three times a day (TID) | ORAL | Status: DC
  Administered 2023-06-07 – 2023-06-08 (×4): 0.5 mg via ORAL
  Filled 2023-06-07 (×6): qty 1

## 2023-06-07 NOTE — Progress Notes (Signed)
 PROGRESS NOTE  AMILA CALLIES ZOX:[id number] DOB: 1945/10/18 DOA: 05/30/2023 PCP: Patient, [name]  Subjective:  The patient was seen and examined this morning, stable, cooperative, son present at bedside. Overnight had some agitation likely delirium  Later this morning patient was evaluated again, as she was tearful in restraints  Dynamically stable-hypertensive, satting 92% on 2 L of oxygen    Brief History:  SAMADHI MAHURIN 78 year old female with a history of dementia, COPD, tobacco abuse, hypertension, anxiety, hyperlipidemia, and primary hyperparathyroidism presenting with 3-day history of shortness of breath.  The patient is unable to provide history at this time secondary to being on BiPAP and receiving some hypnotic medications.  History is obtained from review of the medical record and speaking with the patient's family.  At baseline, the patient is pleasantly confused and able to perform her activities of daily living.  The patient continues to smoke 2 packs/day.  Aside from shortness of breath, family states that there has not been any particular complaints.  She has had a nonproductive cough.  There is not been any fevers, vomiting, diarrhea, abdominal pain.  The patient has had a few loose stools without hematochezia or melena.  Because of worsening shortness of breath, the patient was brought to the emergency department for further evaluation and treatment.  Notably, the patient's son states that the patient does not take any of her medications as directed nor use her inhalers as directed at home. In the ED, the patient was afebrile and hemodynamically stable.  Oxygen saturation was in the 60s on room air.  The patient was placed on BiPAP.  WBC 10.5, hemoglobin 18.6, platelets 190.  Sodium 139, potassium 3.2, bicarbonate 25, serum creatinine 1.07.  LFTs were unremarkable.  Chest x-ray showed increased interstitial markings and hyperinflation.  COVID-19 PCR was  negative.  RSV was positive.  Lactic acid 2.6>> 1.5.  PCT 0.42.  EKG shows sinus rhythm and nonspecific T wave changes.  VBG showed 7.3/50 select/60/27.  The patient was started on Solu-Medrol and bronchodilators.   Assessment/Plan:  Acute respiratory failure with hypoxia and hypercarbia -Much improved from respiratory status -Down to 2 L of oxygen, satting 92% , -Off BiPAP x 48 hours now -Maintaining O2 sat at 92% with 3 L of oxygen  -Secondary to COPD exacerbation in the setting of RSV infection   -06/02/23 @0530 --increase WOB>>placed back on BiPAP -Initial VBG 7.3/56/60/27 -3/10 chest x-ray--hyperinflation, slight increase interstitial markings -06/02/23--updated son--FULL CODE for now-- Dr. Arbutus Leas discussed concerns regarding her dementia, frailty, poor baseline pulm function if she required intubation/MV>>he will discuss with family regarding wishes -06/02/23--ativan 0.5mg , morphine 1mg  given; lasix 20 mg IV, hydralazine 10 mg; now on BiPAP   -12/05/2023 currently on 3 L of oxygen, satting 93% -agitated, as needed Haldol, Ativan added -12/06/2023 -  -remain on 3 L of oxygen, satting 92%, during the use less of Haldol, Ativan 12/07/2023, more awake, still on 3 L of oxygen, discontinuing Haldol, and Ativan, continue as needed morphine-coercion p.o. intake 12/08/2023-patient was seen and examined-mild agitation, delirium overnight otherwise stable this morning--- to 2 L, satting 92%   COPD exacerbation -With acute respiratory failure, off BiPAP--- down to 2 L of oxygen  -Continue Pulmicort -Continue Brovana -Continue IV Solu-Medrol  >> tapering down -Continue DuoNebs -PCT 0.42   RSV pneumonitis -Supportive care   Toxic metabolic encephalopathy-with delirium -Slow improvement likely due to hypoxia, infection MRI of the brain-reviewed, no acute intracranial  normalities -Continue monitoring  tobacco abuse -Tobacco cessation discussed   Major neurocognitive disorder -Patient is at  risk for hospital delirium and clinical decompensation -Medication was reviewed, restarting Remeron 30 mg nightly   Hypokalemia/hypophosphatemia -Repleted   Tachyarrhythmia  -Switched scheduled IV metoprolol to p.o.- Heart rate is stabilizing   prognosis remain poor-due to multiple comorbidities and acute respiratory failure. Palliative care consulted Continue discussion with family regarding CODE STATUS, and goals of care    Family Communication:   son updated 3/10  Consultants:  none  Code Status:  FULL   DVT Prophylaxis:  [place] Lovenox   Procedures: As Listed in Progress Note Above  Antibiotics: Ceftriaxone Azithro 3/7>> completed      Objective: Vitals:   06/07/23 0821 06/07/23 0830 06/07/23 0845 06/07/23 0900  BP:  (!) 175/73  (!) 160/109  Pulse: (!) 106 (!) 163 87 94  Resp: (!) 30 (!) 22 (!) 21 (!) 26  Temp:      TempSrc:      SpO2: 91% (!) 87% 94% 92%  Weight:      Height:        Intake/Output Summary (Last 24 hours) at 06/07/2023 1025 Last data filed at 06/07/2023 1000 Gross per 24 hour  Intake 457.44 ml  Output --  Net 457.44 ml   Weight change:  Exam:    General:  AAO x 2,    HEENT:  Normocephalic, PERRL, otherwise with in Normal limits   Neuro:  CNII-XII intact. , normal motor and sensation, reflexes intact   Lungs:   Clear to auscultation BL, Respirations unlabored,  No wheezes / crackles  Cardio:    S1/S2, RRR, No murmure, No Rubs or Gallops   Abdomen:  Soft, non-tender, bowel sounds active all four quadrants, no guarding or peritoneal signs.  Muscular  skeletal:  Limited exam -global generalized weaknesses - in bed, able to move all 4 extremities,   2+ pulses,  symmetric, No pitting edema  Skin:  Dry, warm to touch, negative for any Rashes,  Wounds: Please see nursing documentation        Data Reviewed: I have personally reviewed following labs and imaging studies Basic Metabolic Panel: Recent Labs  Lab 06/01/23 0604  06/02/23 0436 06/03/23 0431 06/05/23 0432 06/06/23 0442 06/07/23 0500  NA 144 145 146* 146* 140 134*  K 4.1 3.7 4.4 4.4 3.7 3.5  CL 109 107 105 103 100 97*  CO2 25 30 30  36* 32 29  GLUCOSE 132* 195* 186* 170* 212* 212*  BUN 42* 31* 32* 29* 27* 26*  CREATININE 1.15* 0.80 0.98 0.84 0.78 0.73  CALCIUM 10.0 9.8 9.6 9.7 9.2 9.1  MG 2.7* 2.3 2.5*  --   --   --   PHOS 2.7 1.6* 4.0  --   --   --      CBC: Recent Labs  Lab 06/02/23 0436 06/05/23 0432 06/06/23 0442 06/07/23 0500  WBC 19.8* 10.2 9.6 10.3  HGB 14.4 15.2* 14.8 15.1*  HCT 46.3* 48.4* 46.9* 46.7*  MCV 96.5 94.5 93.1 91.0  PLT 228 241 226 203    HbA1C: No results for input(s): "HGBA1C" in the last 72 hours.  Urine analysis:    Component Value Date/Time   COLORURINE AMBER (A) 05/31/2023 0607   APPEARANCEUR CLOUDY (A) 05/31/2023 0607   LABSPEC 1.018 05/31/2023 0607   PHURINE 5.0 05/31/2023 0607   GLUCOSEU NEGATIVE 05/31/2023 0607   HGBUR MODERATE (A) 05/31/2023 0607   BILIRUBINUR NEGATIVE 05/31/2023 9562  BILIRUBINUR negative 08/11/2019 1432   BILIRUBINUR small 06/02/2019 1539   KETONESUR NEGATIVE 05/31/2023 0607   PROTEINUR >=300 (A) 05/31/2023 0607   UROBILINOGEN 0.2 08/11/2019 1432   UROBILINOGEN 0.2 07/07/2013 1550   NITRITE NEGATIVE 05/31/2023 0607   LEUKOCYTESUR NEGATIVE 05/31/2023 1610   Sepsis Labs: @LABRCNTIP (procalcitonin:4,lacticidven:4) ) Recent Results (from the past 240 hours)  Resp panel by RT-PCR (RSV, Flu A&B, Covid) Anterior Nasal Swab     Status: Abnormal   Collection Time: 05/30/23  7:45 PM   Specimen: Anterior Nasal Swab  Result Value Ref Range Status   SARS Coronavirus 2 by RT PCR NEGATIVE NEGATIVE Final    Comment: (NOTE) SARS-CoV-2 target nucleic acids are NOT DETECTED.  The SARS-CoV-2 RNA is generally detectable in upper respiratory specimens during the acute phase of infection. The lowest concentration of SARS-CoV-2 viral copies this assay can detect is 138 copies/mL. A  negative result does not preclude SARS-Cov-2 infection and should not be used as the sole basis for treatment or other patient management decisions. A negative result may occur with  improper specimen collection/handling, submission of specimen other than nasopharyngeal swab, presence of viral mutation(s) within the areas targeted by this assay, and inadequate number of viral copies(<138 copies/mL). A negative result must be combined with clinical observations, patient history, and epidemiological information. The expected result is Negative.  Fact Sheet for Patients:  BloggerCourse.com  Fact Sheet for Healthcare Providers:  SeriousBroker.it  This test is no t yet approved or cleared by the [place] FDA and  has been authorized for detection and/or diagnosis of SARS-CoV-2 by FDA under an Emergency Use Authorization (EUA). This EUA will remain  in effect (meaning this test can be used) for the duration of the COVID-19 declaration under Section 564(b)(1) of the Act, 21 U.S.C.section 360bbb-3(b)(1), unless the authorization is terminated  or revoked sooner.       Influenza A by PCR NEGATIVE NEGATIVE Final   Influenza B by PCR NEGATIVE NEGATIVE Final    Comment: (NOTE) The Xpert Xpress SARS-CoV-2/FLU/RSV plus assay is intended as an aid in the diagnosis of influenza from Nasopharyngeal swab specimens and should not be used as a sole basis for treatment. Nasal washings and aspirates are unacceptable for Xpert Xpress SARS-CoV-2/FLU/RSV testing.  Fact Sheet for Patients: BloggerCourse.com  Fact Sheet for Healthcare Providers: SeriousBroker.it  This test is not yet approved or cleared by the [place] FDA and has been authorized for detection and/or diagnosis of SARS-CoV-2 by FDA under an Emergency Use Authorization (EUA). This EUA will remain in effect (meaning this test can  be used) for the duration of the COVID-19 declaration under Section 564(b)(1) of the Act, 21 U.S.C. section 360bbb-3(b)(1), unless the authorization is terminated or revoked.     Resp Syncytial Virus by PCR POSITIVE (A) NEGATIVE Final    Comment: (NOTE) Fact Sheet for Patients: BloggerCourse.com  Fact Sheet for Healthcare Providers: SeriousBroker.it  This test is not yet approved or cleared by the [place] FDA and has been authorized for detection and/or diagnosis of SARS-CoV-2 by FDA under an Emergency Use Authorization (EUA). This EUA will remain in effect (meaning this test can be used) for the duration of the COVID-19 declaration under Section 564(b)(1) of the Act, 21 U.S.C. section 360bbb-3(b)(1), unless the authorization is terminated or revoked.  Performed at [facility], [address], [place], [place] [address]   Blood Culture (routine x 2)     Status: None   Collection Time: 05/30/23  7:45 PM  Specimen: BLOOD  Result Value Ref Range Status   Specimen Description BLOOD BLOOD LEFT ARM AC  Final   Special Requests BOTTLES DRAWN AEROBIC AND ANAEROBIC  Final   Culture   Final    NO GROWTH 5 DAYS Performed at [facility], [address], [place], [place] [address]    Report Status 06/04/2023 FINAL  Final  Blood Culture (routine x 2)     Status: None   Collection Time: 05/30/23  8:00 PM   Specimen: BLOOD  Result Value Ref Range Status   Specimen Description BLOOD BLOOD RIGHT HAND  Final   Special Requests BOTTLES DRAWN AEROBIC AND ANAEROBIC  Final   Culture   Final    NO GROWTH 5 DAYS Performed at [facility], [address], [place], [place] [address]    Report Status 06/04/2023 FINAL  Final  MRSA Next Gen by PCR, Nasal     Status: None   Collection Time: 05/30/23  9:47 PM   Specimen: Nasal Mucosa; Nasal Swab  Result Value Ref Range Status   MRSA by PCR Next Gen NOT DETECTED NOT DETECTED Final    Comment:  (NOTE) The GeneXpert MRSA Assay (FDA approved for NASAL specimens only), is one component of a comprehensive MRSA colonization surveillance program. It is not intended to diagnose MRSA infection nor to guide or monitor treatment for MRSA infections. Test performance is not FDA approved in patients less than 32 years old. Performed at [facility], [address], [place], [place] [address]      Scheduled Meds:  amLODipine  5 mg Oral Daily   arformoterol  15 mcg Nebulization BID   budesonide (PULMICORT) nebulizer solution  0.5 mg Nebulization BID   Chlorhexidine Gluconate Cloth  6 each Topical Daily   enoxaparin (LOVENOX) injection  30 mg Subcutaneous Q24H   furosemide  20 mg Intravenous BID   insulin aspart  0-6 Units Subcutaneous TID WC   levalbuterol  0.63 mg Nebulization BID   methylPREDNISolone (SOLU-MEDROL) injection  40 mg Intravenous Q24H   metoprolol succinate  100 mg Oral Daily   mirtazapine  30 mg Oral QHS   mouth rinse  15 mL Mouth Rinse 4 times per day   revefenacin  175 mcg Nebulization Daily   Continuous Infusions:    Procedures/Studies: MR BRAIN WO CONTRAST Result Date: 06/05/2023 CLINICAL DATA:  Altered mental status EXAM: MRI HEAD WITHOUT CONTRAST TECHNIQUE: Multiplanar, multiecho pulse sequences of the brain and surrounding structures were obtained without intravenous contrast. COMPARISON:  08/07/2004 FINDINGS: Brain: No acute infarct, mass effect or extra-axial collection. No acute or chronic hemorrhage. There is multifocal hyperintense T2-weighted signal within the white matter. Parenchymal volume and CSF spaces are normal. The midline structures are normal. Vascular: Normal flow voids. Skull and upper cervical spine: Normal calvarium and skull base. Visualized upper cervical spine and soft tissues are normal. Sinuses/Orbits:No acute finding IMPRESSION: 1. No acute intracranial abnormality. 2. Findings of chronic small vessel ischemia. Electronically Signed   By:  Deatra Robinson M.D.   On: 06/05/2023 20:29   DG CHEST PORT 1 VIEW Result Date: 06/05/2023 CLINICAL DATA:  Shortness of breath. EXAM: PORTABLE CHEST 1 VIEW COMPARISON:  06/02/2023 FINDINGS: Lung bases are excluded from the field of view. Aortic atherosclerotic calcifications. Stable cardiomediastinal contours. The lungs are hyperinflated with coarsened interstitial markings noted bilaterally. No signs of pleural effusion, interstitial edema or airspace disease. The visualized osseous structures appear intact. IMPRESSION: 1. No acute cardiopulmonary abnormalities. 2. COPD. 3. Aortic Atherosclerosis (  ICD10-I70.0). Electronically Signed   By: Signa Kell M.D.   On: 06/05/2023 06:40   ECHOCARDIOGRAM COMPLETE Result Date: 06/02/2023    ECHOCARDIOGRAM REPORT   Patient Name:   Carla [name] Date of Exam: 06/02/2023 Medical Rec #:  [id number]        Height:       60.0 in Accession #:    [id number]       Weight:       84.9 lb Date of Birth:  1945-07-08        BSA:          1.297 m Patient Age:    78 years         BP:           171/74 mmHg Patient Gender: F                HR:           96 bpm. [facility] Procedure: 2D Echo, Cardiac Doppler and Color Doppler (Both Spectral and Color            Flow Doppler were utilized during procedure). Indications:    Congestive Heart Failure I50.9  History:        Patient has prior history of Echocardiogram examinations, most                 recent 06/14/2022. COPD; Risk Factors:Hypertension, Current                 Smoker, Dyslipidemia and Diabetes. RSV (respiratory syncytial                 virus pneumonia), Acute on chronic respiratory failure with                 hypoxia (HCC), Dementia.  Sonographer:    Celesta Gentile RCS Referring Phys: [phone] DAVID TAT IMPRESSIONS  1. Left ventricular ejection fraction, by estimation, is 55 to 60%. The left ventricle has normal function. The left ventricle has no regional wall motion abnormalities. There is moderate left  ventricular hypertrophy. Left ventricular diastolic parameters are consistent with Grade I diastolic dysfunction (impaired relaxation). Elevated left atrial pressure.  2. Right ventricular systolic function is normal. The right ventricular size is normal.  3. The mitral valve is normal in structure. No evidence of mitral valve regurgitation. No evidence of mitral stenosis.  4. The tricuspid valve is abnormal.  5. The aortic valve has an indeterminant number of cusps. There is moderate calcification of the aortic valve. There is moderate thickening of the aortic valve. Aortic valve regurgitation is moderate. Aortic valve sclerosis/calcification is present, without any evidence of aortic stenosis. FINDINGS  Left Ventricle: Left ventricular ejection fraction, by estimation, is 55 to 60%. The left ventricle has normal function. The left ventricle has no regional wall motion abnormalities. The left ventricular internal cavity size was normal in size. There is  moderate left ventricular hypertrophy. Left ventricular diastolic parameters are consistent with Grade I diastolic dysfunction (impaired relaxation). Elevated left atrial pressure. Right Ventricle: Not able to assess, patient not able to cooperate with IVC assessment. The right ventricular size is normal. Right vetricular wall thickness was not well visualized. Right ventricular systolic function is normal. Left Atrium: Left atrial size was normal in size. Right Atrium: Right atrial size was normal in size. Pericardium: There is no evidence of pericardial effusion. Mitral Valve: The mitral valve is normal in structure. No evidence of mitral valve regurgitation. No  evidence of mitral valve stenosis. Tricuspid Valve: The tricuspid valve is abnormal. Tricuspid valve regurgitation is mild . No evidence of tricuspid stenosis. Aortic Valve: The aortic valve has an indeterminant number of cusps. There is moderate calcification of the aortic valve. There is moderate  thickening of the aortic valve. There is moderate aortic valve annular calcification. Aortic valve regurgitation is moderate. Aortic regurgitation PHT measures 281 msec. Aortic valve sclerosis/calcification is present, without any evidence of aortic stenosis. Aortic valve mean gradient measures 5.0 mmHg. Aortic valve peak gradient measures 10.1 mmHg. Aortic valve area, by VTI measures 2.36 cm. Pulmonic Valve: The pulmonic valve was not well visualized. Pulmonic valve regurgitation is not visualized. No evidence of pulmonic stenosis. Aorta: The aortic root is normal in size and structure. Venous: The inferior vena cava was not well visualized. IAS/Shunts: No atrial level shunt detected by color flow Doppler.  LEFT VENTRICLE PLAX 2D LVIDd:         3.70 cm   Diastology LVIDs:         2.60 cm   LV e' medial:    5.11 cm/s LV PW:         1.30 cm   LV E/e' medial:  19.8 LV IVS:        1.20 cm   LV e' lateral:   6.74 cm/s LVOT diam:     1.80 cm   LV E/e' lateral: 15.0 LV SV:         64 LV SV Index:   49 LVOT Area:     2.54 cm  RIGHT VENTRICLE RV S prime:     12.45 cm/s TAPSE (M-mode): 1.7 cm LEFT ATRIUM             Index        RIGHT ATRIUM           Index LA diam:        2.75 cm 2.12 cm/m   RA Area:     14.70 cm LA Vol (A2C):   43.7 ml 33.70 ml/m  RA Volume:   35.90 ml  27.68 ml/m LA Vol (A4C):   23.9 ml 18.43 ml/m LA Biplane Vol: 32.8 ml 25.29 ml/m  AORTIC VALVE AV Area (Vmax):    2.35 cm AV Area (Vmean):   2.28 cm AV Area (VTI):     2.36 cm AV Vmax:           159.00 cm/s AV Vmean:          104.000 cm/s AV VTI:            0.271 m AV Peak Grad:      10.1 mmHg AV Mean Grad:      5.0 mmHg LVOT Vmax:         147.00 cm/s LVOT Vmean:        93.100 cm/s LVOT VTI:          0.251 m LVOT/AV VTI ratio: 0.93 AI PHT:            281 msec  AORTA Ao Root diam: 3.30 cm Ao Asc diam:  3.30 cm MITRAL VALVE                TRICUSPID VALVE MV Area (PHT): 4.15 cm     TR Peak grad:   26.4 mmHg MV Decel Time: 183 msec     TR Vmax:         257.00 cm/s MV E velocity: 101.00 cm/s MV A velocity: 148.00  cm/s  SHUNTS MV E/A ratio:  0.68         Systemic VTI:  0.25 m                             Systemic Diam: 1.80 cm Dina Rich MD Electronically signed by Dina Rich MD Signature Date/Time: 06/02/2023/3:32:10 PM    Final    DG Chest Port 1 View Result Date: 06/02/2023 CLINICAL DATA:  Shortness of breath and COPD. EXAM: PORTABLE CHEST 1 VIEW COMPARISON:  05/30/2023 FINDINGS: Stable cardiomediastinal contours. Aortic atherosclerosis. Lungs appear hyperinflated. Coarsened interstitial markings. Pulmonary vascular congestion without frank edema. No airspace consolidation, atelectasis or pneumothorax. Visualized osseous structures are intact. IMPRESSION: 1. Pulmonary vascular congestion without frank edema. 2. COPD. Electronically Signed   By: Signa Kell M.D.   On: 06/02/2023 06:52   DG Chest Port 1 View Result Date: 05/30/2023 CLINICAL DATA:  Worsening shortness of breath. History of COPD. Sepsis workup. EXAM: PORTABLE CHEST 1 VIEW COMPARISON:  Portable chest 07/03/2022 FINDINGS: The heart is slightly enlarged. There is increased central vascular prominence. Interval new mild interstitial edema in both lung bases with trace pleural effusions. The lungs are emphysematous with no focal pulmonary consolidation. Findings consistent with mild CHF or fluid overload. The mediastinum is stable. The aorta is heavily calcified. Osteopenia and thoracic spondylosis. No new osseous finding. Multiple overlying monitor wires. IMPRESSION: 1. Findings consistent with mild CHF or fluid overload. 2. Emphysema.  No focal consolidation. 3. Aortic atherosclerosis. Electronically Signed   By: Almira Bar M.D.   On: 05/30/2023 23:08    Tekia Waterbury A Tamiko Leopard, 55 minutes critical care time spent spent in seeing evaluate patient, reviewing labs, Meds, electronic medical records.   [facility]  If 7PM-7AM, please contact  night-coverage [email or web address] Password [email or web address] 06/07/2023, 10:25 AM   LOS: 8 days

## 2023-06-07 NOTE — Progress Notes (Signed)
 Blood pressure (!) 160/109, pulse 94, temperature 98.1 F (36.7 C), temperature source Axillary, resp. rate (!) 26, height 5' (1.524 m), weight 41.4 kg, SpO2 92%.   The patient was seen and examined this morning-son present at bedside\ Very cooperative... Was eating.   Reevaluated the patient-patient is in strain, she is in tears I have untied one of her hands-I am sitting at the side of bed she is not pulling of any of her lines or her oxygen off yet   Will order one-to-one sitter for close observation  Will try to keep her off restraints.  As it makes the patient more agitated.  Will try to reorient the patient as often as possible.     SIGNED: Kendell Bane, MD, FHM. FAAFP [facility],  Pager (please use [email or web address] to page/text)  Please use Epic Secure Chat for non-urgent communication (7AM-7PM) If 7PM-7AM, please contact night-coverage [email or web address],  06/07/2023, 10:23 AM

## 2023-06-07 NOTE — Progress Notes (Addendum)
 D.Shahmehdi notified that pt's restraints off per son's request (He is at bedside)  and now son is leaving and order needs to be put back in due to pt's constant attempts to get out of bed, pulling at lines.   Dr.Shahmehdi notified that pt.is high risk to fall or have a injury if restraints are removed at this time.

## 2023-06-07 NOTE — Progress Notes (Signed)
 Patient has not used BiPAP tonight.

## 2023-06-07 NOTE — Progress Notes (Signed)
 Pt continuously getting off the bed and pulling off contraptions all night, multiple attempts to redirect her, to no avail; even with Haldol and Olanzapine. Restraints ordered and applied. Son came and stayed at bedside, restraints were removed. Will endorse to incoming nurse.

## 2023-06-08 DIAGNOSIS — J9621 Acute and chronic respiratory failure with hypoxia: Secondary | ICD-10-CM | POA: Diagnosis not present

## 2023-06-08 LAB — CBC
HCT: 49.2 % — ABNORMAL HIGH (ref 36.0–46.0)
Hemoglobin: 16 g/dL — ABNORMAL HIGH (ref 12.0–15.0)
MCH: 29.8 pg (ref 26.0–34.0)
MCHC: 32.5 g/dL (ref 30.0–36.0)
MCV: 91.6 fL (ref 80.0–100.0)
Platelets: 211 10*3/uL (ref 150–400)
RBC: 5.37 MIL/uL — ABNORMAL HIGH (ref 3.87–5.11)
RDW: 14.1 % (ref 11.5–15.5)
WBC: 10.4 10*3/uL (ref 4.0–10.5)
nRBC: 0 % (ref 0.0–0.2)

## 2023-06-08 LAB — BASIC METABOLIC PANEL
Anion gap: 7 (ref 5–15)
BUN: 39 mg/dL — ABNORMAL HIGH (ref 8–23)
CO2: 36 mmol/L — ABNORMAL HIGH (ref 22–32)
Calcium: 9.2 mg/dL (ref 8.9–10.3)
Chloride: 95 mmol/L — ABNORMAL LOW (ref 98–111)
Creatinine, Ser: 1.01 mg/dL — ABNORMAL HIGH (ref 0.44–1.00)
GFR, Estimated: 57 mL/min — ABNORMAL LOW (ref >60)
Glucose, Bld: 145 mg/dL — ABNORMAL HIGH (ref 70–99)
Potassium: 2.9 mmol/L — ABNORMAL LOW (ref 3.5–5.1)
Sodium: 138 mmol/L (ref 135–145)

## 2023-06-08 LAB — GLUCOSE, CAPILLARY
Glucose-Capillary: 120 mg/dL — ABNORMAL HIGH (ref 70–99)
Glucose-Capillary: 198 mg/dL — ABNORMAL HIGH (ref 70–99)
Glucose-Capillary: 150 mg/dL — ABNORMAL HIGH (ref 70–99)
Glucose-Capillary: 165 mg/dL — ABNORMAL HIGH (ref 70–99)

## 2023-06-08 LAB — MAGNESIUM: Magnesium: 2.4 mg/dL (ref 1.7–2.4)

## 2023-06-08 MED ORDER — POTASSIUM CHLORIDE CRYS ER 20 MEQ PO TBCR
40.0000 meq | EXTENDED_RELEASE_TABLET | ORAL | Status: AC
  Administered 2023-06-08 (×2): 40 meq via ORAL
  Filled 2023-06-08 (×2): qty 2

## 2023-06-08 MED ORDER — QUETIAPINE FUMARATE 100 MG PO TABS
200.0000 mg | ORAL_TABLET | Freq: Every day | ORAL | Status: DC
  Administered 2023-06-08 – 2023-06-09 (×2): 200 mg via ORAL
  Filled 2023-06-08 (×2): qty 2

## 2023-06-08 MED ORDER — POLYETHYLENE GLYCOL 3350 17 G PO PACK
17.0000 g | PACK | Freq: Every day | ORAL | Status: DC | PRN
  Administered 2023-06-08: 17 g via ORAL
  Filled 2023-06-08: qty 1

## 2023-06-08 MED ORDER — SENNOSIDES-DOCUSATE SODIUM 8.6-50 MG PO TABS
1.0000 | ORAL_TABLET | Freq: Two times a day (BID) | ORAL | Status: DC
  Administered 2023-06-08 – 2023-06-13 (×6): 1 via ORAL
  Filled 2023-06-08 (×8): qty 1

## 2023-06-08 MED ORDER — MAGNESIUM SULFATE 2 GM/50ML IV SOLN
2.0000 g | Freq: Once | INTRAVENOUS | Status: AC
  Administered 2023-06-08: 2 g via INTRAVENOUS
  Filled 2023-06-08: qty 50

## 2023-06-08 NOTE — Plan of Care (Signed)
  Problem: Education: Goal: Knowledge of General Education information will improve Description: Including pain rating scale, medication(s)/side effects and non-pharmacologic comfort measures Outcome: Progressing   Problem: Health Behavior/Discharge Planning: Goal: Ability to manage health-related needs will improve Outcome: Progressing   Problem: Clinical Measurements: Goal: Ability to maintain clinical measurements within normal limits will improve Outcome: Progressing Goal: Will remain free from infection Outcome: Progressing Goal: Diagnostic test results will improve Outcome: Progressing Goal: Respiratory complications will improve Outcome: Progressing Goal: Cardiovascular complication will be avoided Outcome: Progressing   Problem: Activity: Goal: Risk for activity intolerance will decrease Outcome: Progressing   Problem: Nutrition: Goal: Adequate nutrition will be maintained Outcome: Progressing   Problem: Coping: Goal: Level of anxiety will decrease Outcome: Progressing   Problem: Elimination: Goal: Will not experience complications related to bowel motility Outcome: Progressing Goal: Will not experience complications related to urinary retention Outcome: Progressing   Problem: Pain Managment: Goal: General experience of comfort will improve and/or be controlled Outcome: Progressing   Problem: Safety: Goal: Ability to remain free from injury will improve Outcome: Progressing   Problem: Skin Integrity: Goal: Risk for impaired skin integrity will decrease Outcome: Progressing   Problem: Education: Goal: Knowledge of disease or condition will improve Outcome: Progressing Goal: Knowledge of the prescribed therapeutic regimen will improve Outcome: Progressing Goal: Individualized Educational Video(s) Outcome: Progressing   Problem: Activity: Goal: Ability to tolerate increased activity will improve Outcome: Progressing Goal: Will verbalize the  importance of balancing activity with adequate rest periods Outcome: Progressing   Problem: Respiratory: Goal: Ability to maintain a clear airway will improve Outcome: Progressing Goal: Levels of oxygenation will improve Outcome: Progressing Goal: Ability to maintain adequate ventilation will improve Outcome: Progressing   Problem: Activity: Goal: Ability to tolerate increased activity will improve Outcome: Progressing   Problem: Clinical Measurements: Goal: Ability to maintain a body temperature in the normal range will improve Outcome: Progressing   Problem: Respiratory: Goal: Ability to maintain adequate ventilation will improve Outcome: Progressing Goal: Ability to maintain a clear airway will improve Outcome: Progressing   Problem: Education: Goal: Ability to describe self-care measures that may prevent or decrease complications (Diabetes Survival Skills Education) will improve Outcome: Progressing Goal: Individualized Educational Video(s) Outcome: Progressing

## 2023-06-08 NOTE — Progress Notes (Signed)
 PROGRESS NOTE  Carla [name]:[id number] DOB: Oct 27, 1945 DOA: 05/30/2023 PCP: Patient, [name]  Subjective: Patient was seen and examined this morning, awake alert following some command, oriented x 1 Nursing staff did not had a good night sleep Still in soft restraints   Brief History:  Carla [name] 78 year old female with a history of dementia, COPD, tobacco abuse, hypertension, anxiety, hyperlipidemia, and primary hyperparathyroidism presenting with 3-day history of shortness of breath.  The patient is unable to provide history at this time secondary to being on BiPAP and receiving some hypnotic medications.  History is obtained from review of the medical record and speaking with the patient's family.  At baseline, the patient is pleasantly confused and able to perform her activities of daily living.  The patient continues to smoke 2 packs/day.  Aside from shortness of breath, family states that there has not been any particular complaints.  She has had a nonproductive cough.  There is not been any fevers, vomiting, diarrhea, abdominal pain.  The patient has had a few loose stools without hematochezia or melena.  Because of worsening shortness of breath, the patient was brought to the emergency department for further evaluation and treatment.  Notably, the patient's son states that the patient does not take any of her medications as directed nor use her inhalers as directed at home. In the ED, the patient was afebrile and hemodynamically stable.  Oxygen saturation was in the 60s on room air.  The patient was placed on BiPAP.  WBC 10.5, hemoglobin 18.6, platelets 190.  Sodium 139, potassium 3.2, bicarbonate 25, serum creatinine 1.07.  LFTs were unremarkable.  Chest x-ray showed increased interstitial markings and hyperinflation.  COVID-19 PCR was negative.  RSV was positive.  Lactic acid 2.6>> 1.5.  PCT 0.42.  EKG shows sinus rhythm and nonspecific T wave changes.  VBG showed  7.3/50 select/60/27.  The patient was started on Solu-Medrol and bronchodilators.   Assessment/Plan:  Acute respiratory failure with hypoxia and hypercarbia -Improved remain off BiPAP-on 2 L of oxygen, satting 93%  -Off BiPAP x 48 hours now -Maintaining O2 sat at 92% with 3 L of oxygen -Secondary to COPD exacerbation in the setting of RSV infection   -06/02/23 @0530 --increase WOB>>placed back on BiPAP -Initial VBG 7.3/56/60/27 -3/10 chest x-ray--hyperinflation, slight increase interstitial markings -06/02/23--updated son--FULL CODE for now-- Dr. Arbutus Leas discussed concerns regarding her dementia, frailty, poor baseline pulm function if she required intubation/MV>>he will discuss with family regarding wishes -06/02/23--ativan 0.5mg , morphine 1mg  given; lasix 20 mg IV, hydralazine 10 mg; now on BiPAP   -12/05/2023 currently on 3 L of oxygen, satting 93% -agitated, as needed Haldol, Ativan added -12/06/2023 -  -remain on 3 L of oxygen, satting 92%, during the use less of Haldol, Ativan 12/07/2023, more awake, still on 3 L of oxygen, discontinuing Haldol, and Ativan, continue as needed morphine-coercion p.o. intake 12/08/2023-patient was seen and examined-mild agitation, delirium overnight otherwise stable this morning--- to 2 L, satting 92%  -12/09/2023 - remain agitated, confused, still on 2 L of oxygen, satting 92%-added Seroquel, scheduled Xanax   COPD exacerbation -With acute respiratory failure, off BiPAP--- down to 2 L of oxygen  -Continue Pulmicort -Continue Brovana -Continue IV Solu-Medrol  >> tapering down -Continue DuoNebs -PCT 0.42   RSV pneumonitis -Supportive care   Toxic metabolic encephalopathy-with delirium -Slow improvement likely due to hypoxia, infection MRI of the brain-reviewed, no acute intracranial normalities -Continue monitoring  tobacco  abuse -Continue NicoDerm patch   Major neurocognitive disorder -Patient is at risk for hospital delirium and clinical  decompensation -Medication was reviewed, restarting Remeron 30 mg nightly Added Seroquel, and scheduled Xanax   Hypokalemia/hypophosphatemia -Repleted   Tachyarrhythmia  -Switched scheduled IV metoprolol to p.o.- Heart rate is stabilizing   prognosis remain poor-due to multiple comorbidities and acute respiratory failure. Palliative care consulted Continue discussion with family regarding CODE STATUS, and goals of care    Family Communication:   son updated 3/10  Consultants:  none  Code Status:  FULL   DVT Prophylaxis:  Harrison Lovenox   Procedures: As Listed in Progress Note Above  Antibiotics: Ceftriaxone Azithro 3/7>> completed      Objective: Vitals:   06/08/23 0830 06/08/23 0900 06/08/23 0930 06/08/23 1000  BP:  114/64 (!) 141/79 (!) 150/58  Pulse:  88 96 86  Resp:   18   Temp:      TempSrc:      SpO2: 91%   93%  Weight:      Height:        Intake/Output Summary (Last 24 hours) at 06/08/2023 1043 Last data filed at 06/08/2023 0800 Gross per 24 hour  Intake 320 ml  Output --  Net 320 ml   Weight change:  Exam:        General:  AAO x 1, confused, agitated  HEENT:  Normocephalic, PERRL, otherwise with in Normal limits   Neuro:  Limited exam, patient remained confused, agitated  CNII-XII intact. , normal motor and sensation, reflexes intact   Lungs:   Clear to auscultation BL, Respirations unlabored,  No wheezes / crackles  Cardio:    S1/S2, RRR, No murmure, No Rubs or Gallops   Abdomen:  Soft, non-tender, bowel sounds active all four quadrants, no guarding or peritoneal signs.  Muscular  skeletal:  Limited exam -global generalized weaknesses - in bed, able to move all 4 extremities,   2+ pulses,  symmetric, No pitting edema  Skin:  Dry, warm to touch, negative for any Rashes,  Wounds: Please see nursing documentation           Data Reviewed: I have personally reviewed following labs and imaging studies Basic Metabolic Panel: Recent  Labs  Lab 06/02/23 0436 06/03/23 0431 06/05/23 0432 06/06/23 0442 06/07/23 0500 06/08/23 0437  NA 145 146* 146* 140 134* 138  K 3.7 4.4 4.4 3.7 3.5 2.9*  CL 107 105 103 100 97* 95*  CO2 30 30 36* 32 29 36*  GLUCOSE 195* 186* 170* 212* 212* 145*  BUN 31* 32* 29* 27* 26* 39*  CREATININE 0.80 0.98 0.84 0.78 0.73 1.01*  CALCIUM 9.8 9.6 9.7 9.2 9.1 9.2  MG 2.3 2.5*  --   --   --  2.4  PHOS 1.6* 4.0  --   --   --   --      CBC: Recent Labs  Lab 06/02/23 0436 06/05/23 0432 06/06/23 0442 06/07/23 0500 06/08/23 0437  WBC 19.8* 10.2 9.6 10.3 10.4  HGB 14.4 15.2* 14.8 15.1* 16.0*  HCT 46.3* 48.4* 46.9* 46.7* 49.2*  MCV 96.5 94.5 93.1 91.0 91.6  PLT 228 241 226 203 211    HbA1C: No results for input(s): "HGBA1C" in the last 72 hours.  Urine analysis:    Component Value Date/Time   COLORURINE AMBER (A) 05/31/2023 0607   APPEARANCEUR CLOUDY (A) 05/31/2023 0607   LABSPEC 1.018 05/31/2023 0607   PHURINE 5.0 05/31/2023 0607   GLUCOSEU NEGATIVE  05/31/2023 0607   HGBUR MODERATE (A) 05/31/2023 0607   BILIRUBINUR NEGATIVE 05/31/2023 0607   BILIRUBINUR negative 08/11/2019 1432   BILIRUBINUR small 06/02/2019 1539   KETONESUR NEGATIVE 05/31/2023 0607   PROTEINUR >=300 (A) 05/31/2023 0607   UROBILINOGEN 0.2 08/11/2019 1432   UROBILINOGEN 0.2 07/07/2013 1550   NITRITE NEGATIVE 05/31/2023 0607   LEUKOCYTESUR NEGATIVE 05/31/2023 1610   Sepsis Labs: @LABRCNTIP (procalcitonin:4,lacticidven:4) ) Recent Results (from the past 240 hours)  Resp panel by RT-PCR (RSV, Flu A&B, Covid) Anterior Nasal Swab     Status: Abnormal   Collection Time: 05/30/23  7:45 PM   Specimen: Anterior Nasal Swab  Result Value Ref Range Status   SARS Coronavirus 2 by RT PCR NEGATIVE NEGATIVE Final    Comment: (NOTE) SARS-CoV-2 target nucleic acids are NOT DETECTED.  The SARS-CoV-2 RNA is generally detectable in upper respiratory specimens during the acute phase of infection. The lowest concentration of  SARS-CoV-2 viral copies this assay can detect is 138 copies/mL. A negative result does not preclude SARS-Cov-2 infection and should not be used as the sole basis for treatment or other patient management decisions. A negative result may occur with  improper specimen collection/handling, submission of specimen other than nasopharyngeal swab, presence of viral mutation(s) within the areas targeted by this assay, and inadequate number of viral copies(<138 copies/mL). A negative result must be combined with clinical observations, patient history, and epidemiological information. The expected result is Negative.  Fact Sheet for Patients:  BloggerCourse.com  Fact Sheet for Healthcare Providers:  SeriousBroker.it  This test is no t yet approved or cleared by the [place] FDA and  has been authorized for detection and/or diagnosis of SARS-CoV-2 by FDA under an Emergency Use Authorization (EUA). This EUA will remain  in effect (meaning this test can be used) for the duration of the COVID-19 declaration under Section 564(b)(1) of the Act, 21 U.S.C.section 360bbb-3(b)(1), unless the authorization is terminated  or revoked sooner.       Influenza A by PCR NEGATIVE NEGATIVE Final   Influenza B by PCR NEGATIVE NEGATIVE Final    Comment: (NOTE) The Xpert Xpress SARS-CoV-2/FLU/RSV plus assay is intended as an aid in the diagnosis of influenza from Nasopharyngeal swab specimens and should not be used as a sole basis for treatment. Nasal washings and aspirates are unacceptable for Xpert Xpress SARS-CoV-2/FLU/RSV testing.  Fact Sheet for Patients: BloggerCourse.com  Fact Sheet for Healthcare Providers: SeriousBroker.it  This test is not yet approved or cleared by the [place] FDA and has been authorized for detection and/or diagnosis of SARS-CoV-2 by FDA under an Emergency Use  Authorization (EUA). This EUA will remain in effect (meaning this test can be used) for the duration of the COVID-19 declaration under Section 564(b)(1) of the Act, 21 U.S.C. section 360bbb-3(b)(1), unless the authorization is terminated or revoked.     Resp Syncytial Virus by PCR POSITIVE (A) NEGATIVE Final    Comment: (NOTE) Fact Sheet for Patients: BloggerCourse.com  Fact Sheet for Healthcare Providers: SeriousBroker.it  This test is not yet approved or cleared by the [place] FDA and has been authorized for detection and/or diagnosis of SARS-CoV-2 by FDA under an Emergency Use Authorization (EUA). This EUA will remain in effect (meaning this test can be used) for the duration of the COVID-19 declaration under Section 564(b)(1) of the Act, 21 U.S.C. section 360bbb-3(b)(1), unless the authorization is terminated or revoked.  Performed at [facility], [address], [place], [place] [address]   Blood Culture (  routine x 2)     Status: None   Collection Time: 05/30/23  7:45 PM   Specimen: BLOOD  Result Value Ref Range Status   Specimen Description BLOOD BLOOD LEFT ARM AC  Final   Special Requests BOTTLES DRAWN AEROBIC AND ANAEROBIC  Final   Culture   Final    NO GROWTH 5 DAYS Performed at [facility], [address], [place], [place] [address]    Report Status 06/04/2023 FINAL  Final  Blood Culture (routine x 2)     Status: None   Collection Time: 05/30/23  8:00 PM   Specimen: BLOOD  Result Value Ref Range Status   Specimen Description BLOOD BLOOD RIGHT HAND  Final   Special Requests BOTTLES DRAWN AEROBIC AND ANAEROBIC  Final   Culture   Final    NO GROWTH 5 DAYS Performed at [facility], [address], [place], [place] [address]    Report Status 06/04/2023 FINAL  Final  MRSA Next Gen by PCR, Nasal     Status: None   Collection Time: 05/30/23  9:47 PM   Specimen: Nasal Mucosa; Nasal Swab  Result Value Ref Range  Status   MRSA by PCR Next Gen NOT DETECTED NOT DETECTED Final    Comment: (NOTE) The GeneXpert MRSA Assay (FDA approved for NASAL specimens only), is one component of a comprehensive MRSA colonization surveillance program. It is not intended to diagnose MRSA infection nor to guide or monitor treatment for MRSA infections. Test performance is not FDA approved in patients less than 61 years old. Performed at [facility], [address] [place], [place] [address]      Scheduled Meds:  ALPRAZolam  0.5 mg Oral TID   amLODipine  5 mg Oral Daily   arformoterol  15 mcg Nebulization BID   budesonide (PULMICORT) nebulizer solution  0.5 mg Nebulization BID   Chlorhexidine Gluconate Cloth  6 each Topical Daily   enoxaparin (LOVENOX) injection  30 mg Subcutaneous Q24H   insulin aspart  0-6 Units Subcutaneous TID WC   levalbuterol  0.63 mg Nebulization BID   methylPREDNISolone (SOLU-MEDROL) injection  40 mg Intravenous Q24H   metoprolol succinate  100 mg Oral Daily   mirtazapine  30 mg Oral QHS   mouth rinse  15 mL Mouth Rinse 4 times per day   potassium chloride  40 mEq Oral Q4H   QUEtiapine  200 mg Oral QHS   revefenacin  175 mcg Nebulization Daily   senna-docusate  1 tablet Oral BID    Procedures/Studies: MR BRAIN WO CONTRAST Result Date: 06/05/2023 CLINICAL DATA:  Altered mental status EXAM: MRI HEAD WITHOUT CONTRAST TECHNIQUE: Multiplanar, multiecho pulse sequences of the brain and surrounding structures were obtained without intravenous contrast. COMPARISON:  08/07/2004 FINDINGS: Brain: No acute infarct, mass effect or extra-axial collection. No acute or chronic hemorrhage. There is multifocal hyperintense T2-weighted signal within the white matter. Parenchymal volume and CSF spaces are normal. The midline structures are normal. Vascular: Normal flow voids. Skull and upper cervical spine: Normal calvarium and skull base. Visualized upper cervical spine and soft tissues are normal.  Sinuses/Orbits:No acute finding IMPRESSION: 1. No acute intracranial abnormality. 2. Findings of chronic small vessel ischemia. Electronically Signed   By: Deatra Robinson M.D.   On: 06/05/2023 20:29   DG CHEST PORT 1 VIEW Result Date: 06/05/2023 CLINICAL DATA:  Shortness of breath. EXAM: PORTABLE CHEST 1 VIEW COMPARISON:  06/02/2023 FINDINGS: Lung bases are excluded from the field of view. Aortic atherosclerotic calcifications.  Stable cardiomediastinal contours. The lungs are hyperinflated with coarsened interstitial markings noted bilaterally. No signs of pleural effusion, interstitial edema or airspace disease. The visualized osseous structures appear intact. IMPRESSION: 1. No acute cardiopulmonary abnormalities. 2. COPD. 3. Aortic Atherosclerosis (ICD10-I70.0). Electronically Signed   By: Signa Kell M.D.   On: 06/05/2023 06:40   ECHOCARDIOGRAM COMPLETE Result Date: 06/02/2023    ECHOCARDIOGRAM REPORT   Patient Name:   Carla [name] Date of Exam: 06/02/2023 Medical Rec #:  [id number]        Height:       60.0 in Accession #:    [id number]       Weight:       84.9 lb Date of Birth:  [birth year]        BSA:          1.297 m Patient Age:    78 years         BP:           171/74 mmHg Patient Gender: F                HR:           96 bpm. [facility] Procedure: 2D Echo, Cardiac Doppler and Color Doppler (Both Spectral and Color            Flow Doppler were utilized during procedure). Indications:    Congestive Heart Failure I50.9  History:        Patient has prior history of Echocardiogram examinations, most                 recent 06/14/2022. COPD; Risk Factors:Hypertension, Current                 Smoker, Dyslipidemia and Diabetes. RSV (respiratory syncytial                 virus pneumonia), Acute on chronic respiratory failure with                 hypoxia (HCC), Dementia.  Sonographer:    Celesta Gentile RCS Referring Phys: [phone] DAVID TAT IMPRESSIONS  1. Left ventricular ejection fraction, by  estimation, is 55 to 60%. The left ventricle has normal function. The left ventricle has no regional wall motion abnormalities. There is moderate left ventricular hypertrophy. Left ventricular diastolic parameters are consistent with Grade I diastolic dysfunction (impaired relaxation). Elevated left atrial pressure.  2. Right ventricular systolic function is normal. The right ventricular size is normal.  3. The mitral valve is normal in structure. No evidence of mitral valve regurgitation. No evidence of mitral stenosis.  4. The tricuspid valve is abnormal.  5. The aortic valve has an indeterminant number of cusps. There is moderate calcification of the aortic valve. There is moderate thickening of the aortic valve. Aortic valve regurgitation is moderate. Aortic valve sclerosis/calcification is present, without any evidence of aortic stenosis. FINDINGS  Left Ventricle: Left ventricular ejection fraction, by estimation, is 55 to 60%. The left ventricle has normal function. The left ventricle has no regional wall motion abnormalities. The left ventricular internal cavity size was normal in size. There is  moderate left ventricular hypertrophy. Left ventricular diastolic parameters are consistent with Grade I diastolic dysfunction (impaired relaxation). Elevated left atrial pressure. Right Ventricle: Not able to assess, patient not able to cooperate with IVC assessment. The right ventricular size is normal. Right vetricular wall thickness was not well visualized. Right ventricular systolic function is normal. Left Atrium:  Left atrial size was normal in size. Right Atrium: Right atrial size was normal in size. Pericardium: There is no evidence of pericardial effusion. Mitral Valve: The mitral valve is normal in structure. No evidence of mitral valve regurgitation. No evidence of mitral valve stenosis. Tricuspid Valve: The tricuspid valve is abnormal. Tricuspid valve regurgitation is mild . No evidence of tricuspid  stenosis. Aortic Valve: The aortic valve has an indeterminant number of cusps. There is moderate calcification of the aortic valve. There is moderate thickening of the aortic valve. There is moderate aortic valve annular calcification. Aortic valve regurgitation is moderate. Aortic regurgitation PHT measures 281 msec. Aortic valve sclerosis/calcification is present, without any evidence of aortic stenosis. Aortic valve mean gradient measures 5.0 mmHg. Aortic valve peak gradient measures 10.1 mmHg. Aortic valve area, by VTI measures 2.36 cm. Pulmonic Valve: The pulmonic valve was not well visualized. Pulmonic valve regurgitation is not visualized. No evidence of pulmonic stenosis. Aorta: The aortic root is normal in size and structure. Venous: The inferior vena cava was not well visualized. IAS/Shunts: No atrial level shunt detected by color flow Doppler.  LEFT VENTRICLE PLAX 2D LVIDd:         3.70 cm   Diastology LVIDs:         2.60 cm   LV e' medial:    5.11 cm/s LV PW:         1.30 cm   LV E/e' medial:  19.8 LV IVS:        1.20 cm   LV e' lateral:   6.74 cm/s LVOT diam:     1.80 cm   LV E/e' lateral: 15.0 LV SV:         64 LV SV Index:   49 LVOT Area:     2.54 cm  RIGHT VENTRICLE RV S prime:     12.45 cm/s TAPSE (M-mode): 1.7 cm LEFT ATRIUM             Index        RIGHT ATRIUM           Index LA diam:        2.75 cm 2.12 cm/m   RA Area:     14.70 cm LA Vol (A2C):   43.7 ml 33.70 ml/m  RA Volume:   35.90 ml  27.68 ml/m LA Vol (A4C):   23.9 ml 18.43 ml/m LA Biplane Vol: 32.8 ml 25.29 ml/m  AORTIC VALVE AV Area (Vmax):    2.35 cm AV Area (Vmean):   2.28 cm AV Area (VTI):     2.36 cm AV Vmax:           159.00 cm/s AV Vmean:          104.000 cm/s AV VTI:            0.271 m AV Peak Grad:      10.1 mmHg AV Mean Grad:      5.0 mmHg LVOT Vmax:         147.00 cm/s LVOT Vmean:        93.100 cm/s LVOT VTI:          0.251 m LVOT/AV VTI ratio: 0.93 AI PHT:            281 msec  AORTA Ao Root diam: 3.30 cm Ao Asc  diam:  3.30 cm MITRAL VALVE                TRICUSPID VALVE MV Area (PHT): 4.15 cm  TR Peak grad:   26.4 mmHg MV Decel Time: 183 msec     TR Vmax:        257.00 cm/s MV E velocity: 101.00 cm/s MV A velocity: 148.00 cm/s  SHUNTS MV E/A ratio:  0.68         Systemic VTI:  0.25 m                             Systemic Diam: 1.80 cm Dina Rich MD Electronically signed by Dina Rich MD Signature Date/Time: 06/02/2023/3:32:10 PM    Final    DG Chest Port 1 View Result Date: 06/02/2023 CLINICAL DATA:  Shortness of breath and COPD. EXAM: PORTABLE CHEST 1 VIEW COMPARISON:  05/30/2023 FINDINGS: Stable cardiomediastinal contours. Aortic atherosclerosis. Lungs appear hyperinflated. Coarsened interstitial markings. Pulmonary vascular congestion without frank edema. No airspace consolidation, atelectasis or pneumothorax. Visualized osseous structures are intact. IMPRESSION: 1. Pulmonary vascular congestion without frank edema. 2. COPD. Electronically Signed   By: Signa Kell M.D.   On: 06/02/2023 06:52   DG Chest Port 1 View Result Date: 05/30/2023 CLINICAL DATA:  Worsening shortness of breath. History of COPD. Sepsis workup. EXAM: PORTABLE CHEST 1 VIEW COMPARISON:  Portable chest 07/03/2022 FINDINGS: The heart is slightly enlarged. There is increased central vascular prominence. Interval new mild interstitial edema in both lung bases with trace pleural effusions. The lungs are emphysematous with no focal pulmonary consolidation. Findings consistent with mild CHF or fluid overload. The mediastinum is stable. The aorta is heavily calcified. Osteopenia and thoracic spondylosis. No new osseous finding. Multiple overlying monitor wires. IMPRESSION: 1. Findings consistent with mild CHF or fluid overload. 2. Emphysema.  No focal consolidation. 3. Aortic atherosclerosis. Electronically Signed   By: Almira Bar M.D.   On: 05/30/2023 23:08    Lue Sykora A Maybell Misenheimer, 55 minutes critical care time spent spent in seeing  evaluate patient, reviewing labs, Meds, electronic medical records.   [facility]  If 7PM-7AM, please contact night-coverage [email or web address] Password [email or web address] 06/08/2023, 10:43 AM   LOS: 9 days

## 2023-06-09 DIAGNOSIS — F01B18 Vascular dementia, moderate, with other behavioral disturbance: Secondary | ICD-10-CM | POA: Diagnosis not present

## 2023-06-09 DIAGNOSIS — J9621 Acute and chronic respiratory failure with hypoxia: Secondary | ICD-10-CM | POA: Diagnosis not present

## 2023-06-09 DIAGNOSIS — Z7189 Other specified counseling: Secondary | ICD-10-CM | POA: Diagnosis not present

## 2023-06-09 DIAGNOSIS — Z515 Encounter for palliative care: Secondary | ICD-10-CM | POA: Diagnosis not present

## 2023-06-09 LAB — CBC
HCT: 52.7 % — ABNORMAL HIGH (ref 36.0–46.0)
Hemoglobin: 16.9 g/dL — ABNORMAL HIGH (ref 12.0–15.0)
MCH: 29.8 pg (ref 26.0–34.0)
MCHC: 32.1 g/dL (ref 30.0–36.0)
MCV: 92.9 fL (ref 80.0–100.0)
Platelets: 195 10*3/uL (ref 150–400)
RBC: 5.67 MIL/uL — ABNORMAL HIGH (ref 3.87–5.11)
RDW: 14.4 % (ref 11.5–15.5)
WBC: 11.3 10*3/uL — ABNORMAL HIGH (ref 4.0–10.5)
nRBC: 0 % (ref 0.0–0.2)

## 2023-06-09 LAB — BASIC METABOLIC PANEL
Anion gap: 8 (ref 5–15)
BUN: 43 mg/dL — ABNORMAL HIGH (ref 8–23)
CO2: 31 mmol/L (ref 22–32)
Calcium: 9.3 mg/dL (ref 8.9–10.3)
Chloride: 102 mmol/L (ref 98–111)
Creatinine, Ser: 0.95 mg/dL (ref 0.44–1.00)
GFR, Estimated: >60 mL/min (ref >60)
Glucose, Bld: 128 mg/dL — ABNORMAL HIGH (ref 70–99)
Potassium: 5.1 mmol/L (ref 3.5–5.1)
Sodium: 141 mmol/L (ref 135–145)

## 2023-06-09 LAB — GLUCOSE, CAPILLARY
Glucose-Capillary: 123 mg/dL — ABNORMAL HIGH (ref 70–99)
Glucose-Capillary: 145 mg/dL — ABNORMAL HIGH (ref 70–99)
Glucose-Capillary: 243 mg/dL — ABNORMAL HIGH (ref 70–99)
Glucose-Capillary: 257 mg/dL — ABNORMAL HIGH (ref 70–99)

## 2023-06-09 MED ORDER — METHYLPREDNISOLONE SODIUM SUCC 40 MG IJ SOLR
30.0000 mg | INTRAMUSCULAR | Status: DC
  Administered 2023-06-10: 30 mg via INTRAVENOUS
  Filled 2023-06-09: qty 1

## 2023-06-09 MED ORDER — LOPERAMIDE HCL 2 MG PO CAPS
2.0000 mg | ORAL_CAPSULE | ORAL | Status: DC | PRN
  Administered 2023-06-09: 2 mg via ORAL
  Filled 2023-06-09: qty 1

## 2023-06-09 MED ORDER — NICOTINE 14 MG/24HR TD PT24
14.0000 mg | MEDICATED_PATCH | Freq: Every day | TRANSDERMAL | Status: DC | PRN
  Administered 2023-06-10 – 2023-06-12 (×3): 14 mg via TRANSDERMAL
  Filled 2023-06-09 (×3): qty 1

## 2023-06-09 MED ORDER — POLYETHYLENE GLYCOL 3350 17 G PO PACK
17.0000 g | PACK | Freq: Every day | ORAL | Status: DC
  Administered 2023-06-09 – 2023-06-13 (×3): 17 g via ORAL
  Filled 2023-06-09 (×4): qty 1

## 2023-06-09 MED ORDER — ALPRAZOLAM 0.25 MG PO TABS
0.2500 mg | ORAL_TABLET | Freq: Two times a day (BID) | ORAL | Status: DC
  Administered 2023-06-09 – 2023-06-13 (×7): 0.25 mg via ORAL
  Filled 2023-06-09 (×8): qty 1

## 2023-06-09 MED ORDER — ALPRAZOLAM 0.25 MG PO TABS: 0.2500 mg | ORAL_TABLET | Freq: Three times a day (TID) | ORAL | Status: DC

## 2023-06-09 MED ORDER — BISACODYL 10 MG RE SUPP
10.0000 mg | Freq: Every day | RECTAL | Status: DC | PRN
  Administered 2023-06-09: 10 mg via RECTAL
  Filled 2023-06-09: qty 1

## 2023-06-09 MED ORDER — METOPROLOL SUCCINATE ER 50 MG PO TB24
50.0000 mg | ORAL_TABLET | Freq: Every day | ORAL | Status: DC
  Administered 2023-06-10: 50 mg via ORAL
  Filled 2023-06-09: qty 1

## 2023-06-09 NOTE — Progress Notes (Signed)
 SLP Cancellation Note  Patient Details Name: Carla [name] MRN: [id number] DOB: 1945-06-06   Cancelled treatment:       Reason Eval/Treat Not Completed: Other (comment) - SLP roused Pt for PO trials; she did take two ice chips without incident but then urgently needed to have a BM. Staff was helping Pt to bedside commode. ST will re-attempt BSE later today as schedule permits. Thank you,  Marji Kuehnel H. Romie Levee, CCC-SLP Speech Language Pathologist    Georgetta Haber 06/09/2023, 12:54 PM

## 2023-06-09 NOTE — Progress Notes (Signed)
 PROGRESS NOTE  Carla [name]:[id number] DOB: April 05, 1945 DOA: 05/30/2023 PCP: Patient, [name]  Subjective: The patient was seen and examined this morning, stable no acute distress -Per nursing staff much better night overnight slept well Hemodynamically stable -satting 97% on 2 L of oxygen    Brief History:  Carla [name] 78 year old female with a history of dementia, COPD, tobacco abuse, hypertension, anxiety, hyperlipidemia, and primary hyperparathyroidism presenting with 3-day history of shortness of breath.  The patient is unable to provide history at this time secondary to being on BiPAP and receiving some hypnotic medications.  History is obtained from review of the medical record and speaking with the patient's family.  At baseline, the patient is pleasantly confused and able to perform her activities of daily living.  The patient continues to smoke 2 packs/day.  Aside from shortness of breath, family states that there has not been any particular complaints.  She has had a nonproductive cough.  There is not been any fevers, vomiting, diarrhea, abdominal pain.  The patient has had a few loose stools without hematochezia or melena.  Because of worsening shortness of breath, the patient was brought to the emergency department for further evaluation and treatment.  Notably, the patient's son states that the patient does not take any of her medications as directed nor use her inhalers as directed at home. In the ED, the patient was afebrile and hemodynamically stable.  Oxygen saturation was in the 60s on room air.  The patient was placed on BiPAP.  WBC 10.5, hemoglobin 18.6, platelets 190.  Sodium 139, potassium 3.2, bicarbonate 25, serum creatinine 1.07.  LFTs were unremarkable.  Chest x-ray showed increased interstitial markings and hyperinflation.  COVID-19 PCR was negative.  RSV was positive.  Lactic acid 2.6>> 1.5.  PCT 0.42.  EKG shows sinus rhythm and nonspecific T wave  changes.  VBG showed 7.3/50 select/60/27.  The patient was started on Solu-Medrol and bronchodilators.   Assessment/Plan:  Acute respiratory failure with hypoxia and hypercarbia  -secondary to COPD and RSV infection  Resolved -On 2 L of oxygen, satting 97% x 48 hours now  -Off BiPAP   -06/02/23 @0530 --increase WOB>>placed back on BiPAP -Initial VBG 7.3/56/60/27 -3/10 chest x-ray--hyperinflation, slight increase interstitial markings -06/02/23--updated son--FULL CODE for now-- Dr. Arbutus Leas discussed concerns regarding her dementia, frailty, poor baseline pulm function if she required intubation/MV>>he will discuss with family regarding wishes -06/02/23--ativan 0.5mg , morphine 1mg  given; lasix 20 mg IV, hydralazine 10 mg; now on BiPAP   -12/05/2023 currently on 3 L of oxygen, satting 93% -agitated, as needed Haldol, Ativan added -12/06/2023 -  -remain on 3 L of oxygen, satting 92%, during the use less of Haldol, Ativan 12/07/2023, more awake, still on 3 L of oxygen, discontinuing Haldol, and Ativan, continue as needed morphine-coercion p.o. intake 12/08/2023-patient was seen and examined-mild agitation, delirium overnight otherwise stable this morning--- to 2 L, satting 92%  -12/09/2023 - remain agitated, confused, still on 2 L of oxygen, satting 92%-added Seroquel, scheduled Xanax   COPD exacerbation -Resolved acute respiratory failure, stable now,-remain on 2 L of oxygen, satting  > 97% -Continue Pulmicort -Continue Brovana -Continue IV Solu-Medrol  >> tapered off. -Continue DuoNebs -PCT 0.42   RSV pneumonitis -Supportive care   Toxic metabolic encephalopathy-with delirium -agitation -Exacerbated by COPD, hypoxia, infection MRI of the brain-reviewed, no acute intracranial normalities -Added medication Xanax 0.5 mg p.o. 3 times daily, Seroquel 200 mg p.o. nightly -  Continue Remeron nightly   tobacco abuse -Continue NicoDerm patch   Major neurocognitive disorder -With agitation,  delirium--- improved with current regimen -Continue home medication of Remeron 30 mg nightly Added Seroquel, and scheduled Xanax   Hypokalemia/hypophosphatemia -Repleted   Tachyarrhythmia  -Remained stable on p.o. metoprolol    prognosis remain poor-due to multiple comorbidities and acute respiratory failure. Palliative care consulted Continue discussion with family regarding CODE STATUS, and goals of care    Family Communication:   son updated 3/10  Consultants:  none  Code Status:  FULL   DVT Prophylaxis:  [place] Lovenox   Procedures: As Listed in Progress Note Above  Antibiotics: Ceftriaxone Azithro 3/7>> completed      Objective: Vitals:   06/09/23 0800 06/09/23 0916 06/09/23 0925 06/09/23 0930  BP: (!) 145/64 (!) 145/64 (!) 130/98   Pulse:   67 68  Resp: 20  (!) 23 14  Temp:      TempSrc:      SpO2:   98% 97%  Weight:      Height:        Intake/Output Summary (Last 24 hours) at 06/09/2023 0955 Last data filed at 06/09/2023 0900 Gross per 24 hour  Intake 120 ml  Output 250 ml  Net -130 ml   Weight change:  Exam:         General:  AAO x 1,  cooperative, no distress;   HEENT:  Normocephalic, PERRL, otherwise with in Normal limits   Neuro:  CNII-XII intact. , normal motor and sensation, reflexes intact   Lungs:   Clear to auscultation BL, Respirations unlabored,  No wheezes / crackles  Cardio:    S1/S2, RRR, No murmure, No Rubs or Gallops   Abdomen:  Soft, non-tender, bowel sounds active all four quadrants, no guarding or peritoneal signs.  Muscular  skeletal:  Limited exam -global generalized weaknesses - in bed, able to move all 4 extremities,   2+ pulses,  symmetric, No pitting edema  Skin:  Dry, warm to touch, negative for any Rashes,  Wounds: Please see nursing documentation              Data Reviewed: I have personally reviewed following labs and imaging studies Basic Metabolic Panel: Recent Labs  Lab 06/03/23 0431  06/05/23 0432 06/06/23 0442 06/07/23 0500 06/08/23 0437 06/09/23 0358  NA 146* 146* 140 134* 138 141  K 4.4 4.4 3.7 3.5 2.9* 5.1  CL 105 103 100 97* 95* 102  CO2 30 36* 32 29 36* 31  GLUCOSE 186* 170* 212* 212* 145* 128*  BUN 32* 29* 27* 26* 39* 43*  CREATININE 0.98 0.84 0.78 0.73 1.01* 0.95  CALCIUM 9.6 9.7 9.2 9.1 9.2 9.3  MG 2.5*  --   --   --  2.4  --   PHOS 4.0  --   --   --   --   --      CBC: Recent Labs  Lab 06/05/23 0432 06/06/23 0442 06/07/23 0500 06/08/23 0437 06/09/23 0637  WBC 10.2 9.6 10.3 10.4 11.3*  HGB 15.2* 14.8 15.1* 16.0* 16.9*  HCT 48.4* 46.9* 46.7* 49.2* 52.7*  MCV 94.5 93.1 91.0 91.6 92.9  PLT 241 226 203 211 195    HbA1C: No results for input(s): "HGBA1C" in the last 72 hours.  Urine analysis:    Component Value Date/Time   COLORURINE AMBER (A) 05/31/2023 0607   APPEARANCEUR CLOUDY (A) 05/31/2023 0607   LABSPEC 1.018 05/31/2023 0607   PHURINE 5.0  05/31/2023 0607   GLUCOSEU NEGATIVE 05/31/2023 0607   HGBUR MODERATE (A) 05/31/2023 0607   BILIRUBINUR NEGATIVE 05/31/2023 0607   BILIRUBINUR negative 08/11/2019 1432   BILIRUBINUR small 06/02/2019 1539   KETONESUR NEGATIVE 05/31/2023 0607   PROTEINUR >=300 (A) 05/31/2023 0607   UROBILINOGEN 0.2 08/11/2019 1432   UROBILINOGEN 0.2 07/07/2013 1550   NITRITE NEGATIVE 05/31/2023 0607   LEUKOCYTESUR NEGATIVE 05/31/2023 4782   Sepsis Labs:   RSV positive on 05/30/2023  Scheduled Meds:  ALPRAZolam  0.5 mg Oral TID   amLODipine  5 mg Oral Daily   arformoterol  15 mcg Nebulization BID   budesonide (PULMICORT) nebulizer solution  0.5 mg Nebulization BID   Chlorhexidine Gluconate Cloth  6 each Topical Daily   enoxaparin (LOVENOX) injection  30 mg Subcutaneous Q24H   insulin aspart  0-6 Units Subcutaneous TID WC   levalbuterol  0.63 mg Nebulization BID   methylPREDNISolone (SOLU-MEDROL) injection  40 mg Intravenous Q24H   metoprolol succinate  100 mg Oral Daily   mirtazapine  30 mg Oral QHS    mouth rinse  15 mL Mouth Rinse 4 times per day   polyethylene glycol  17 g Oral Daily   QUEtiapine  200 mg Oral QHS   revefenacin  175 mcg Nebulization Daily   senna-docusate  1 tablet Oral BID    Procedures/Studies: MR BRAIN WO CONTRAST Result Date: 06/05/2023 CLINICAL DATA:  Altered mental status EXAM: MRI HEAD WITHOUT CONTRAST TECHNIQUE: Multiplanar, multiecho pulse sequences of the brain and surrounding structures were obtained without intravenous contrast. COMPARISON:  08/07/2004 FINDINGS: Brain: No acute infarct, mass effect or extra-axial collection. No acute or chronic hemorrhage. There is multifocal hyperintense T2-weighted signal within the white matter. Parenchymal volume and CSF spaces are normal. The midline structures are normal. Vascular: Normal flow voids. Skull and upper cervical spine: Normal calvarium and skull base. Visualized upper cervical spine and soft tissues are normal. Sinuses/Orbits:No acute finding IMPRESSION: 1. No acute intracranial abnormality. 2. Findings of chronic small vessel ischemia. Electronically Signed   By: Deatra Robinson M.D.   On: 06/05/2023 20:29   DG CHEST PORT 1 VIEW Result Date: 06/05/2023 CLINICAL DATA:  Shortness of breath. EXAM: PORTABLE CHEST 1 VIEW COMPARISON:  06/02/2023 FINDINGS: Lung bases are excluded from the field of view. Aortic atherosclerotic calcifications. Stable cardiomediastinal contours. The lungs are hyperinflated with coarsened interstitial markings noted bilaterally. No signs of pleural effusion, interstitial edema or airspace disease. The visualized osseous structures appear intact. IMPRESSION: 1. No acute cardiopulmonary abnormalities. 2. COPD. 3. Aortic Atherosclerosis (ICD10-I70.0). Electronically Signed   By: Signa Kell M.D.   On: 06/05/2023 06:40   ECHOCARDIOGRAM COMPLETE Result Date: 06/02/2023    ECHOCARDIOGRAM REPORT   Patient Name:   Carla [name] Date of Exam: 06/02/2023 Medical Rec #:  [id number]        Height:        60.0 in Accession #:    [id number]       Weight:       84.9 lb Date of Birth:  08-19-1945        BSA:          1.297 m Patient Age:    78 years         BP:           171/74 mmHg Patient Gender: F                HR:  96 bpm. [facility] Procedure: 2D Echo, Cardiac Doppler and Color Doppler (Both Spectral and Color            Flow Doppler were utilized during procedure). Indications:    Congestive Heart Failure I50.9  History:        Patient has prior history of Echocardiogram examinations, most                 recent 06/14/2022. COPD; Risk Factors:Hypertension, Current                 Smoker, Dyslipidemia and Diabetes. RSV (respiratory syncytial                 virus pneumonia), Acute on chronic respiratory failure with                 hypoxia (HCC), Dementia.  Sonographer:    Celesta Gentile RCS Referring Phys: [id number] DAVID TAT IMPRESSIONS  1. Left ventricular ejection fraction, by estimation, is 55 to 60%. The left ventricle has normal function. The left ventricle has no regional wall motion abnormalities. There is moderate left ventricular hypertrophy. Left ventricular diastolic parameters are consistent with Grade I diastolic dysfunction (impaired relaxation). Elevated left atrial pressure.  2. Right ventricular systolic function is normal. The right ventricular size is normal.  3. The mitral valve is normal in structure. No evidence of mitral valve regurgitation. No evidence of mitral stenosis.  4. The tricuspid valve is abnormal.  5. The aortic valve has an indeterminant number of cusps. There is moderate calcification of the aortic valve. There is moderate thickening of the aortic valve. Aortic valve regurgitation is moderate. Aortic valve sclerosis/calcification is present, without any evidence of aortic stenosis. FINDINGS  Left Ventricle: Left ventricular ejection fraction, by estimation, is 55 to 60%. The left ventricle has normal function. The left ventricle has no regional wall motion  abnormalities. The left ventricular internal cavity size was normal in size. There is  moderate left ventricular hypertrophy. Left ventricular diastolic parameters are consistent with Grade I diastolic dysfunction (impaired relaxation). Elevated left atrial pressure. Right Ventricle: Not able to assess, patient not able to cooperate with IVC assessment. The right ventricular size is normal. Right vetricular wall thickness was not well visualized. Right ventricular systolic function is normal. Left Atrium: Left atrial size was normal in size. Right Atrium: Right atrial size was normal in size. Pericardium: There is no evidence of pericardial effusion. Mitral Valve: The mitral valve is normal in structure. No evidence of mitral valve regurgitation. No evidence of mitral valve stenosis. Tricuspid Valve: The tricuspid valve is abnormal. Tricuspid valve regurgitation is mild . No evidence of tricuspid stenosis. Aortic Valve: The aortic valve has an indeterminant number of cusps. There is moderate calcification of the aortic valve. There is moderate thickening of the aortic valve. There is moderate aortic valve annular calcification. Aortic valve regurgitation is moderate. Aortic regurgitation PHT measures 281 msec. Aortic valve sclerosis/calcification is present, without any evidence of aortic stenosis. Aortic valve mean gradient measures 5.0 mmHg. Aortic valve peak gradient measures 10.1 mmHg. Aortic valve area, by VTI measures 2.36 cm. Pulmonic Valve: The pulmonic valve was not well visualized. Pulmonic valve regurgitation is not visualized. No evidence of pulmonic stenosis. Aorta: The aortic root is normal in size and structure. Venous: The inferior vena cava was not well visualized. IAS/Shunts: No atrial level shunt detected by color flow Doppler.  LEFT VENTRICLE PLAX 2D LVIDd:  3.70 cm   Diastology LVIDs:         2.60 cm   LV e' medial:    5.11 cm/s LV PW:         1.30 cm   LV E/e' medial:  19.8 LV IVS:         1.20 cm   LV e' lateral:   6.74 cm/s LVOT diam:     1.80 cm   LV E/e' lateral: 15.0 LV SV:         64 LV SV Index:   49 LVOT Area:     2.54 cm  RIGHT VENTRICLE RV S prime:     12.45 cm/s TAPSE (M-mode): 1.7 cm LEFT ATRIUM             Index        RIGHT ATRIUM           Index LA diam:        2.75 cm 2.12 cm/m   RA Area:     14.70 cm LA Vol (A2C):   43.7 ml 33.70 ml/m  RA Volume:   35.90 ml  27.68 ml/m LA Vol (A4C):   23.9 ml 18.43 ml/m LA Biplane Vol: 32.8 ml 25.29 ml/m  AORTIC VALVE AV Area (Vmax):    2.35 cm AV Area (Vmean):   2.28 cm AV Area (VTI):     2.36 cm AV Vmax:           159.00 cm/s AV Vmean:          104.000 cm/s AV VTI:            0.271 m AV Peak Grad:      10.1 mmHg AV Mean Grad:      5.0 mmHg LVOT Vmax:         147.00 cm/s LVOT Vmean:        93.100 cm/s LVOT VTI:          0.251 m LVOT/AV VTI ratio: 0.93 AI PHT:            281 msec  AORTA Ao Root diam: 3.30 cm Ao Asc diam:  3.30 cm MITRAL VALVE                TRICUSPID VALVE MV Area (PHT): 4.15 cm     TR Peak grad:   26.4 mmHg MV Decel Time: 183 msec     TR Vmax:        257.00 cm/s MV E velocity: 101.00 cm/s MV A velocity: 148.00 cm/s  SHUNTS MV E/A ratio:  0.68         Systemic VTI:  0.25 m                             Systemic Diam: 1.80 cm Dina Rich MD Electronically signed by Dina Rich MD Signature Date/Time: 06/02/2023/3:32:10 PM    Final    DG Chest Port 1 View Result Date: 06/02/2023 CLINICAL DATA:  Shortness of breath and COPD. EXAM: PORTABLE CHEST 1 VIEW COMPARISON:  05/30/2023 FINDINGS: Stable cardiomediastinal contours. Aortic atherosclerosis. Lungs appear hyperinflated. Coarsened interstitial markings. Pulmonary vascular congestion without frank edema. No airspace consolidation, atelectasis or pneumothorax. Visualized osseous structures are intact. IMPRESSION: 1. Pulmonary vascular congestion without frank edema. 2. COPD. Electronically Signed   By: Signa Kell M.D.   On: 06/02/2023 06:52   DG Chest Port 1  View Result Date: 05/30/2023 CLINICAL DATA:  Worsening  shortness of breath. History of COPD. Sepsis workup. EXAM: PORTABLE CHEST 1 VIEW COMPARISON:  Portable chest 07/03/2022 FINDINGS: The heart is slightly enlarged. There is increased central vascular prominence. Interval new mild interstitial edema in both lung bases with trace pleural effusions. The lungs are emphysematous with no focal pulmonary consolidation. Findings consistent with mild CHF or fluid overload. The mediastinum is stable. The aorta is heavily calcified. Osteopenia and thoracic spondylosis. No new osseous finding. Multiple overlying monitor wires. IMPRESSION: 1. Findings consistent with mild CHF or fluid overload. 2. Emphysema.  No focal consolidation. 3. Aortic atherosclerosis. Electronically Signed   By: Almira Bar M.D.   On: 05/30/2023 23:08    Kasheem Toner A Kawanda Drumheller, 55 minutes critical care time spent spent in seeing evaluate patient, reviewing labs, Meds, electronic medical records.   [facility]  If 7PM-7AM, please contact night-coverage [email or web address] Password [email or web address] 06/09/2023, 9:55 AM   LOS: 10 days

## 2023-06-09 NOTE — Progress Notes (Signed)
 Patient was sleeping and would wake when name called but did not keep eyes open most of the day. She did wake up to take PO meds and when she had to go to the bathroom and to take some PO Meds. Scheduled xanax held at time due to patient not staying awake and needing SLP eval. Dr Flossie Dibble aware. Patient on heating blanket and rectal temp is currently 97.0. Dr Flossie Dibble aware and we will continue to monitor as he feels this may be due to poor PO intake the past couple of days. Patient is currently more alert and was able to feed self dinner and drank from cup. Appetite very good. Patient this morning noted to have stool in rectum when putting heating blanket rectal thermometer in and only had smear documented on night shift since PO miralax and senna given. Verbal order for soap sud enema given per Dr Flossie Dibble at 0830 and done at 0900 after failed attempt to have BM when patient placed on bedside commode. Patient had two small formed stool noted after enema on bedside commode but stool noted in rectum when placed temp catheter replaced. Dr Flossie Dibble and T. Dove NP made aware. Ducolax suppository ordered and given to which patient continued to have small BM on bedside commode throughout the day. Patient had been calm, redirectable and tolerated interventions well.

## 2023-06-09 NOTE — Plan of Care (Signed)
  Problem: Education: Goal: Knowledge of General Education information will improve Description: Including pain rating scale, medication(s)/side effects and non-pharmacologic comfort measures Outcome: Progressing   Problem: Health Behavior/Discharge Planning: Goal: Ability to manage health-related needs will improve Outcome: Progressing   Problem: Clinical Measurements: Goal: Ability to maintain clinical measurements within normal limits will improve Outcome: Progressing Goal: Will remain free from infection Outcome: Progressing Goal: Diagnostic test results will improve Outcome: Progressing Goal: Respiratory complications will improve Outcome: Progressing Goal: Cardiovascular complication will be avoided Outcome: Progressing   Problem: Activity: Goal: Risk for activity intolerance will decrease Outcome: Progressing   Problem: Nutrition: Goal: Adequate nutrition will be maintained Outcome: Progressing   Problem: Coping: Goal: Level of anxiety will decrease Outcome: Progressing   Problem: Elimination: Goal: Will not experience complications related to bowel motility Outcome: Progressing Goal: Will not experience complications related to urinary retention Outcome: Progressing   Problem: Pain Managment: Goal: General experience of comfort will improve and/or be controlled Outcome: Progressing   Problem: Safety: Goal: Ability to remain free from injury will improve Outcome: Progressing   Problem: Skin Integrity: Goal: Risk for impaired skin integrity will decrease Outcome: Progressing   Problem: Education: Goal: Knowledge of disease or condition will improve Outcome: Progressing Goal: Knowledge of the prescribed therapeutic regimen will improve Outcome: Progressing Goal: Individualized Educational Video(s) Outcome: Progressing   Problem: Activity: Goal: Ability to tolerate increased activity will improve Outcome: Progressing Goal: Will verbalize the  importance of balancing activity with adequate rest periods Outcome: Progressing   Problem: Respiratory: Goal: Ability to maintain a clear airway will improve Outcome: Progressing Goal: Levels of oxygenation will improve Outcome: Progressing Goal: Ability to maintain adequate ventilation will improve Outcome: Progressing   Problem: Activity: Goal: Ability to tolerate increased activity will improve Outcome: Progressing   Problem: Clinical Measurements: Goal: Ability to maintain a body temperature in the normal range will improve Outcome: Progressing   Problem: Respiratory: Goal: Ability to maintain adequate ventilation will improve Outcome: Progressing Goal: Ability to maintain a clear airway will improve Outcome: Progressing   Problem: Education: Goal: Ability to describe self-care measures that may prevent or decrease complications (Diabetes Survival Skills Education) will improve Outcome: Progressing Goal: Individualized Educational Video(s) Outcome: Progressing   Problem: Coping: Goal: Ability to adjust to condition or change in health will improve Outcome: Progressing   Problem: Fluid Volume: Goal: Ability to maintain a balanced intake and output will improve Outcome: Progressing   Problem: Health Behavior/Discharge Planning: Goal: Ability to identify and utilize available resources and services will improve Outcome: Progressing Goal: Ability to manage health-related needs will improve Outcome: Progressing   Problem: Metabolic: Goal: Ability to maintain appropriate glucose levels will improve Outcome: Progressing   Problem: Nutritional: Goal: Maintenance of adequate nutrition will improve Outcome: Progressing Goal: Progress toward achieving an optimal weight will improve Outcome: Progressing   Problem: Skin Integrity: Goal: Risk for impaired skin integrity will decrease Outcome: Progressing   Problem: Tissue Perfusion: Goal: Adequacy of tissue  perfusion will improve Outcome: Progressing

## 2023-06-09 NOTE — Progress Notes (Signed)
 Palliative: Face-to-face conference with bedside nursing staff related to patient condition, needs, goals of care.  Mrs. Carla [name], is lying quietly in bed.  She appears acutely/chronically ill and frail and thin.  She is resting comfortably and will briefly make but not keep eye contact when prompted.  I am not sure that she can make her basic needs known.  There is no family at bedside at this time.  Call to husband of 60 years, Carla [name].  We talk about Carla [name]'s acute and chronic health concerns.  We talked about her acute declines and the treatment plan.  Time for outcomes.  Carla [name] shares that Carla [name] is never left alone, their son Carla [name] moved in about 3 years ago.  He states that they would prefer to bring her home, but she is still in the intensive care.  We talked about CODE STATUS.  Mr. Wareing states that his wife was on life support about 1 year ago and improved.  He states that they would accept long-term life support, trach/PEG, if needed in order to give her time to recover.  Conference with attending, bedside nursing staff, transition of care team related to patient condition, needs, goals of care, disposition.  Plan:  Continue to treat the treatable.  Would accept long term life support, trach/PEG.  Time for outcomes.  Disposition remains to be seen.     50 minutes  Lillia Carmel, NP Palliative medicine team Team phone [phone]

## 2023-06-09 NOTE — TOC Progression Note (Signed)
 Transition of Care [facility]) - Progression Note    Patient Details  Name: Carla [name] MRN: [id number] Date of Birth: [birth year]  Transition of Care [facility]) CM/SW Contact  Villa Herb, [place] Phone Number: 06/09/2023, 10:13 AM  Clinical Narrative:    CSW spoke to Leonard with [facility] who states that they cannot consider taking pt until she has calmed down and has been out of restraints with no behaviors for at least 48 hours. Also, facility is unable to take pt is if she is on Seroquel and does not have an appropriate diagnosis for that. CSW updated MD and RN of facility concerns. TOC to follow.   Expected Discharge Plan: Home/Self Care Barriers to Discharge: Continued Medical Work up  Expected Discharge Plan and Services In-house Referral: Clinical Social Work Discharge Planning Services: CM Consult   Living arrangements for the past 2 months: Single Family Home                                       Social Determinants of Health (SDOH) Interventions SDOH Screenings   Food Insecurity: Patient Unable To Answer (05/30/2023)  Housing: Patient Unable To Answer (05/30/2023)  Transportation Needs: Patient Unable To Answer (05/30/2023)  Utilities: Patient Unable To Answer (05/30/2023)  Alcohol Screen: Low Risk  (02/24/2020)  Depression (PHQ2-9): Low Risk  (04/05/2020)  Financial Resource Strain: Medium Risk (02/24/2020)  Physical Activity: Insufficiently Active (02/24/2020)  Social Connections: Patient Unable To Answer (05/30/2023)  Stress: No Stress Concern Present (02/24/2020)  Tobacco Use: High Risk (06/04/2023)    Readmission Risk Interventions    06/05/2023   11:03 AM 07/22/2022    1:43 PM  Readmission Risk Prevention Plan  Transportation Screening Complete Complete  HRI or Home Care Consult Complete   Social Work Consult for Recovery Care Planning/Counseling Complete   Palliative Care Screening Complete Complete  Medication Review Oceanographer) Complete

## 2023-06-09 NOTE — Plan of Care (Signed)
  Problem: Education: Goal: Knowledge of General Education information will improve Description: Including pain rating scale, medication(s)/side effects and non-pharmacologic comfort measures Outcome: Progressing   Problem: Health Behavior/Discharge Planning: Goal: Ability to manage health-related needs will improve Outcome: Progressing   Problem: Clinical Measurements: Goal: Ability to maintain clinical measurements within normal limits will improve Outcome: Progressing Goal: Will remain free from infection Outcome: Progressing Goal: Diagnostic test results will improve Outcome: Progressing Goal: Respiratory complications will improve Outcome: Progressing Goal: Cardiovascular complication will be avoided Outcome: Progressing   Problem: Activity: Goal: Risk for activity intolerance will decrease Outcome: Not Progressing   Problem: Nutrition: Goal: Adequate nutrition will be maintained Outcome: Progressing   Problem: Coping: Goal: Level of anxiety will decrease Outcome: Not Progressing   Problem: Elimination: Goal: Will not experience complications related to bowel motility Outcome: Progressing Goal: Will not experience complications related to urinary retention Outcome: Progressing   Problem: Pain Managment: Goal: General experience of comfort will improve and/or be controlled Outcome: Progressing   Problem: Safety: Goal: Ability to remain free from injury will improve Outcome: Progressing   Problem: Skin Integrity: Goal: Risk for impaired skin integrity will decrease Outcome: Progressing   Problem: Education: Goal: Knowledge of disease or condition will improve Outcome: Progressing Goal: Knowledge of the prescribed therapeutic regimen will improve Outcome: Progressing Goal: Individualized Educational Video(s) Outcome: Progressing   Problem: Activity: Goal: Ability to tolerate increased activity will improve Outcome: Progressing Goal: Will verbalize the  importance of balancing activity with adequate rest periods Outcome: Progressing   Problem: Respiratory: Goal: Ability to maintain a clear airway will improve Outcome: Progressing Goal: Levels of oxygenation will improve Outcome: Progressing Goal: Ability to maintain adequate ventilation will improve Outcome: Progressing   Problem: Activity: Goal: Ability to tolerate increased activity will improve Outcome: Progressing   Problem: Clinical Measurements: Goal: Ability to maintain a body temperature in the normal range will improve Outcome: Progressing   Problem: Respiratory: Goal: Ability to maintain adequate ventilation will improve Outcome: Progressing Goal: Ability to maintain a clear airway will improve Outcome: Progressing   Problem: Education: Goal: Ability to describe self-care measures that may prevent or decrease complications (Diabetes Survival Skills Education) will improve Outcome: Progressing Goal: Individualized Educational Video(s) Outcome: Progressing   Problem: Coping: Goal: Ability to adjust to condition or change in health will improve Outcome: Progressing   Problem: Fluid Volume: Goal: Ability to maintain a balanced intake and output will improve Outcome: Progressing   Problem: Health Behavior/Discharge Planning: Goal: Ability to identify and utilize available resources and services will improve Outcome: Progressing Goal: Ability to manage health-related needs will improve Outcome: Progressing   Problem: Metabolic: Goal: Ability to maintain appropriate glucose levels will improve Outcome: Progressing   Problem: Nutritional: Goal: Maintenance of adequate nutrition will improve Outcome: Progressing Goal: Progress toward achieving an optimal weight will improve Outcome: Progressing   Problem: Skin Integrity: Goal: Risk for impaired skin integrity will decrease Outcome: Progressing   Problem: Tissue Perfusion: Goal: Adequacy of tissue  perfusion will improve Outcome: Progressing

## 2023-06-09 NOTE — Evaluation (Signed)
 Clinical/Bedside Swallow Evaluation Patient Details  Name: Carla [name] MRN: [id number] Date of Birth: 06/11/1945  Today's Date: 06/09/2023 Time: SLP Start Time (ACUTE ONLY): 1605 SLP Stop Time (ACUTE ONLY): 1630 SLP Time Calculation (min) (ACUTE ONLY): 25 min  Past Medical History:  Past Medical History:  Diagnosis Date   Anxiety    ASCVD (arteriosclerotic cardiovascular disease)    No critical disease on cath in 8/97: mild AI with trival, if any l AS; negative stress nuclear in 2010   Atrophic vaginitis 04/18/2016   Back pain    Cervical disc herniation    Chronic bronchitis    Colon polyps    De Quervain's disease (radial styloid tenosynovitis) 10/14/2016   Dementia (HCC) 06/10/2020   Depression    Elevated hemoglobin A1c    Fasting hyperglycemia    GERD (gastroesophageal reflux disease)    Heart disease    HSV (herpes simplex virus) infection    Hydronephrosis    Hypercalcemia 12/30/2008   Qualifier: Diagnosis of  By: Lillia Mountain LPN, Brandi     Hypercholesteremia    Hypertension    Neck muscle spasm 04/23/2014   Onychomycosis 07/08/2015   Osteoporosis    Peripheral vascular disease (HCC)    stauts post aortabifemoral graft     Secondary erythrocytosis 08/05/2012   Secondary to COPD   Tobacco abuse    has stopped   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 Fields:Normal colon without evidence of polyps, masses, inflammatory changes/Normal retroflex view of the rectum   COLONOSCOPY N/A 08/24/2013   Procedure: COLONOSCOPY;  Surgeon: West Bali, MD;  Location: [facility];  Service: Endoscopy;  Laterality: N/A;  10:30-moved to [address] notified pt   lysis of adhesions  2000   PARTIAL HYSTERECTOMY     right kidney surgery following damage during arterial surgery     TOTAL ABDOMINAL  HYSTERECTOMY W/ BILATERAL SALPINGOOPHORECTOMY  1975   HPI:  Pt admitted on 05/30/23 and per MD: 78 year old female with a history of dementia, COPD, tobacco abuse, hypertension, anxiety, hyperlipidemia, and primary hyperparathyroidism presenting with 3-day history of shortness of breath.  The patient is unable to provide history at this time secondary to being on BiPAP and receiving some hypnotic medications.  History is obtained from review of the medical record and speaking with the patient's family.  At baseline, the patient is pleasantly confused and able to perform her activities of daily living.  The patient continues to smoke 2 packs/day.  Aside from shortness of breath, family states that there has not been any particular complaints.  She has had a nonproductive cough.  There is not been any fevers, vomiting, diarrhea, abdominal pain.  The patient has had a few loose stools without hematochezia or melena.  Because of worsening shortness of breath, the patient was brought to the emergency department for further evaluation and treatment; RSV. BSE requested 06/09/23.    Assessment / Plan / Recommendation  Clinical Impression  Clinical swallow evaluation completed at bedside. Oral motor examination is WNL. Pt able to place her upper dentures. Her voice is hoarse/rough (she is a smoker) and she has a congested cough. Pt assessed with ice chips, thin water via cup/straw, NTL via cup/straw, puree, mech soft, and regular textures. Pt with occasional delayed  cough after sequential straw sips of both thin and NTL, however difficult to determine if due to taking sequential sips and holding breath in setting of respiratory compromise/smoker, etc. Pt needs reminders to refrain from taking sequential sips. Recommend regular textures (staff to cut up meats) and thin liquids via cup sips and avoid use of straws at this time, PO medication whole with water or in puree. SLP will follow her during acute stay. Above to  RN. SLP Visit Diagnosis: Dysphagia, unspecified (R13.10)    Aspiration Risk  Mild aspiration risk    Diet Recommendation Regular;Thin liquid    Liquid Administration via: Cup;No straw Medication Administration: Whole meds with liquid Supervision: Patient able to self feed;Intermittent supervision to cue for compensatory strategies Compensations: Slow rate;Small sips/bites Postural Changes: Seated upright at 90 degrees;Remain upright for at least 30 minutes after po intake    Other  Recommendations Oral Care Recommendations: Oral care BID    Recommendations for follow up therapy are one component of a multi-disciplinary discharge planning process, led by the attending physician.  Recommendations may be updated based on patient status, additional functional criteria and insurance authorization.  Follow up Recommendations No SLP follow up      Assistance Recommended at Discharge    Functional Status Assessment Patient has had a recent decline in their functional status and demonstrates the ability to make significant improvements in function in a reasonable and predictable amount of time.  Frequency and Duration min 2x/week  1 week       Prognosis Prognosis for improved oropharyngeal function: Good Barriers to Reach Goals: Cognitive deficits      Swallow Study   General Date of Onset: 06/09/23 (admitted 05/30/23) HPI: Pt admitted on 05/30/23 and per MD: 78 year old female with a history of dementia, COPD, tobacco abuse, hypertension, anxiety, hyperlipidemia, and primary hyperparathyroidism presenting with 3-day history of shortness of breath.  The patient is unable to provide history at this time secondary to being on BiPAP and receiving some hypnotic medications.  History is obtained from review of the medical record and speaking with the patient's family.  At baseline, the patient is pleasantly confused and able to perform her activities of daily living.  The patient continues to smoke  2 packs/day.  Aside from shortness of breath, family states that there has not been any particular complaints.  She has had a nonproductive cough.  There is not been any fevers, vomiting, diarrhea, abdominal pain.  The patient has had a few loose stools without hematochezia or melena.  Because of worsening shortness of breath, the patient was brought to the emergency department for further evaluation and treatment; RSV. BSE requested 06/09/23. Type of Study: Bedside Swallow Evaluation Diet Prior to this Study: Regular;Thin liquids (Level 0) Temperature Spikes Noted: No Respiratory Status: Nasal cannula History of Recent Intubation: No Behavior/Cognition: Alert;Pleasant mood;Cooperative Oral Cavity Assessment: Within Functional Limits Oral Care Completed by SLP: Recent completion by staff Oral Cavity - Dentition: Dentures, top Vision: Functional for self-feeding Self-Feeding Abilities: Able to feed self;Needs set up Patient Positioning: Upright in bed Baseline Vocal Quality: Hoarse Volitional Cough: Congested;Strong Volitional Swallow: Able to elicit    Oral/Motor/Sensory Function Overall Oral Motor/Sensory Function: Within functional limits   Ice Chips Ice chips: Within functional limits Presentation: Spoon   Thin Liquid Thin Liquid: Within functional limits Presentation: Cup;Self Fed;Straw    Nectar Thick Nectar Thick Liquid: Within functional limits Presentation: Cup;Self Fed;Straw   Honey Thick Honey Thick Liquid: Not tested   Puree Puree:  Within functional limits Presentation: Spoon   Solid     Solid: Within functional limits Presentation: Self Fed     Thank you,  Havery Moros, CCC-SLP [phone]  Chantale Leugers 06/09/2023,4:30 PM

## 2023-06-10 ENCOUNTER — Encounter: Payer: Self-pay | Admitting: Cardiology

## 2023-06-10 ENCOUNTER — Inpatient Hospital Stay (HOSPITAL_COMMUNITY)

## 2023-06-10 DIAGNOSIS — J9621 Acute and chronic respiratory failure with hypoxia: Secondary | ICD-10-CM | POA: Diagnosis not present

## 2023-06-10 LAB — CBC WITH DIFFERENTIAL/PLATELET
Abs Immature Granulocytes: 0.14 10*3/uL — ABNORMAL HIGH (ref 0.00–0.07)
Basophils Absolute: 0.1 10*3/uL (ref 0.0–0.1)
Basophils Relative: 0 %
Eosinophils Absolute: 0 10*3/uL (ref 0.0–0.5)
Eosinophils Relative: 0 %
HCT: 49.1 % — ABNORMAL HIGH (ref 36.0–46.0)
Hemoglobin: 16 g/dL — ABNORMAL HIGH (ref 12.0–15.0)
Immature Granulocytes: 1 %
Lymphocytes Relative: 13 %
Lymphs Abs: 2.2 10*3/uL (ref 0.7–4.0)
MCH: 30 pg (ref 26.0–34.0)
MCHC: 32.6 g/dL (ref 30.0–36.0)
MCV: 91.9 fL (ref 80.0–100.0)
Monocytes Absolute: 0.7 10*3/uL (ref 0.1–1.0)
Monocytes Relative: 4 %
Neutro Abs: 13.8 10*3/uL — ABNORMAL HIGH (ref 1.7–7.7)
Neutrophils Relative %: 82 %
Platelets: 157 10*3/uL (ref 150–400)
RBC: 5.34 MIL/uL — ABNORMAL HIGH (ref 3.87–5.11)
RDW: 14.5 % (ref 11.5–15.5)
WBC: 16.9 10*3/uL — ABNORMAL HIGH (ref 4.0–10.5)
nRBC: 0 % (ref 0.0–0.2)

## 2023-06-10 LAB — COMPREHENSIVE METABOLIC PANEL
ALT: 39 U/L (ref 0–44)
AST: 18 U/L (ref 15–41)
Albumin: 3 g/dL — ABNORMAL LOW (ref 3.5–5.0)
Alkaline Phosphatase: 71 U/L (ref 38–126)
Anion gap: 9 (ref 5–15)
BUN: 40 mg/dL — ABNORMAL HIGH (ref 8–23)
CO2: 28 mmol/L (ref 22–32)
Calcium: 8.9 mg/dL (ref 8.9–10.3)
Chloride: 99 mmol/L (ref 98–111)
Creatinine, Ser: 1.04 mg/dL — ABNORMAL HIGH (ref 0.44–1.00)
GFR, Estimated: 55 mL/min — ABNORMAL LOW (ref >60)
Glucose, Bld: 160 mg/dL — ABNORMAL HIGH (ref 70–99)
Potassium: 4.6 mmol/L (ref 3.5–5.1)
Sodium: 136 mmol/L (ref 135–145)
Total Bilirubin: 0.6 mg/dL (ref 0.0–1.2)
Total Protein: 5.5 g/dL — ABNORMAL LOW (ref 6.5–8.1)

## 2023-06-10 LAB — URINALYSIS, ROUTINE W REFLEX MICROSCOPIC
Bilirubin Urine: NEGATIVE
Glucose, UA: NEGATIVE mg/dL
Hgb urine dipstick: NEGATIVE
Ketones, ur: NEGATIVE mg/dL
Leukocytes,Ua: NEGATIVE
Nitrite: NEGATIVE
Protein, ur: NEGATIVE mg/dL
Specific Gravity, Urine: 1.017 (ref 1.005–1.030)
pH: 6 (ref 5.0–8.0)

## 2023-06-10 LAB — GLUCOSE, CAPILLARY
Glucose-Capillary: 176 mg/dL — ABNORMAL HIGH (ref 70–99)
Glucose-Capillary: 110 mg/dL — ABNORMAL HIGH (ref 70–99)
Glucose-Capillary: 233 mg/dL — ABNORMAL HIGH (ref 70–99)
Glucose-Capillary: 107 mg/dL — ABNORMAL HIGH (ref 70–99)

## 2023-06-10 LAB — PROCALCITONIN: Procalcitonin: 0.14 ng/mL

## 2023-06-10 MED ORDER — SODIUM CHLORIDE 0.9 % IV SOLN: INTRAVENOUS | Status: AC

## 2023-06-10 MED ORDER — METHYLPREDNISOLONE SODIUM SUCC 40 MG IJ SOLR
20.0000 mg | INTRAMUSCULAR | Status: DC
  Administered 2023-06-11: 20 mg via INTRAVENOUS
  Filled 2023-06-10: qty 1

## 2023-06-10 MED ORDER — QUETIAPINE FUMARATE 100 MG PO TABS
100.0000 mg | ORAL_TABLET | Freq: Every day | ORAL | Status: DC
  Administered 2023-06-10 – 2023-06-12 (×3): 100 mg via ORAL
  Filled 2023-06-10 (×3): qty 1

## 2023-06-10 MED ORDER — DEXTROSE-SODIUM CHLORIDE 5-0.45 % IV SOLN: INTRAVENOUS | Status: DC

## 2023-06-10 NOTE — Progress Notes (Signed)
 Speech Language Pathology Treatment: Dysphagia  Patient Details Name: Carla [name] MRN: [id number] DOB: May 07, 1945 Today's Date: 06/10/2023 Time: 6213-0865 SLP Time Calculation (min) (ACUTE ONLY): 14 min  Assessment / Plan / Recommendation Clinical Impression  Pt seen for dysphagia f/u tx session to assess current diet/precautions given during clinical swallow assessment.  Pt consumed thin via cup/straw with min A/verbal cues for volume control without overt s/s of aspiration present.  Pt stated "I am feeling much better" and voicing desire for coffee.  Small trial of regular solids with adequate mastication observed and no oropharyngeal concerns noted.  Pt with a timely swallow, adequate oral preparation/propulsion and oropharyngeal clearance.  Nursing stated pt was "coughing with liquids" initially, but this has improved with meals and medication administration.  Recommend continue current diet of Regular/thin liquids with slow rate/small bites/sips and intermittent supervision for following swallowing precautions d/t known cognitive impairment.  ST will f/u for brief period in acute setting with no f/u required at d/c.    HPI HPI: Pt admitted on 05/30/23 and per MD: 78 year old female with a history of dementia, COPD, tobacco abuse, hypertension, anxiety, hyperlipidemia, and primary hyperparathyroidism presenting with 3-day history of shortness of breath. The patient is unable to provide history at this time secondary to being on BiPAP and receiving some hypnotic medications. History is obtained from review of the medical record and speaking with the patient's family. At baseline, the patient is pleasantly confused and able to perform her activities of daily living. The patient continues to smoke 2 packs/day. Aside from shortness of breath, family states that there has not been any particular complaints. She has had a nonproductive cough. There is not been any fevers, vomiting, diarrhea, abdominal  pain. The patient has had a few loose stools without hematochezia or melena. Because of worsening shortness of breath, the patient was brought to the emergency department for further evaluation and treatment; RSV. BSE completed with recs for regular/thin without straws.  ST f/u for diet tolerance/education/dysphagia tx.      SLP Plan  Continue with current plan of care      Recommendations for follow up therapy are one component of a multi-disciplinary discharge planning process, led by the attending physician.  Recommendations may be updated based on patient status, additional functional criteria and insurance authorization.    Recommendations  Diet recommendations: Regular;Thin liquid Liquids provided via: Cup;Straw Medication Administration: Whole meds with liquid Supervision: Patient able to self feed Compensations: Slow rate;Small sips/bites                  Oral care BID     Dysphagia, unspecified (R13.10)     Continue with current plan of care     Pat Crystle Carelli,M.S.,CCC-SLP  06/10/2023, 11:29 AM

## 2023-06-10 NOTE — Plan of Care (Signed)
  Problem: Education: Goal: Knowledge of General Education information will improve Description: Including pain rating scale, medication(s)/side effects and non-pharmacologic comfort measures Outcome: Progressing   Problem: Health Behavior/Discharge Planning: Goal: Ability to manage health-related needs will improve Outcome: Progressing   Problem: Clinical Measurements: Goal: Ability to maintain clinical measurements within normal limits will improve Outcome: Progressing Goal: Will remain free from infection Outcome: Progressing Goal: Diagnostic test results will improve Outcome: Progressing Goal: Respiratory complications will improve Outcome: Progressing Goal: Cardiovascular complication will be avoided Outcome: Progressing   Problem: Activity: Goal: Risk for activity intolerance will decrease Outcome: Progressing   Problem: Nutrition: Goal: Adequate nutrition will be maintained Outcome: Progressing   Problem: Coping: Goal: Level of anxiety will decrease Outcome: Progressing   Problem: Elimination: Goal: Will not experience complications related to bowel motility Outcome: Progressing Goal: Will not experience complications related to urinary retention Outcome: Progressing   Problem: Pain Managment: Goal: General experience of comfort will improve and/or be controlled Outcome: Progressing   Problem: Safety: Goal: Ability to remain free from injury will improve Outcome: Progressing   Problem: Skin Integrity: Goal: Risk for impaired skin integrity will decrease Outcome: Progressing   Problem: Education: Goal: Knowledge of disease or condition will improve Outcome: Progressing Goal: Knowledge of the prescribed therapeutic regimen will improve Outcome: Progressing Goal: Individualized Educational Video(s) Outcome: Progressing   Problem: Activity: Goal: Ability to tolerate increased activity will improve Outcome: Progressing Goal: Will verbalize the  importance of balancing activity with adequate rest periods Outcome: Progressing   Problem: Respiratory: Goal: Ability to maintain a clear airway will improve Outcome: Progressing Goal: Levels of oxygenation will improve Outcome: Progressing Goal: Ability to maintain adequate ventilation will improve Outcome: Progressing   Problem: Activity: Goal: Ability to tolerate increased activity will improve Outcome: Progressing   Problem: Clinical Measurements: Goal: Ability to maintain a body temperature in the normal range will improve Outcome: Progressing   Problem: Respiratory: Goal: Ability to maintain adequate ventilation will improve Outcome: Progressing Goal: Ability to maintain a clear airway will improve Outcome: Progressing   Problem: Education: Goal: Individualized Educational Video(s) Outcome: Progressing   Problem: Fluid Volume: Goal: Ability to maintain a balanced intake and output will improve Outcome: Progressing   Problem: Health Behavior/Discharge Planning: Goal: Ability to identify and utilize available resources and services will improve Outcome: Progressing Goal: Ability to manage health-related needs will improve Outcome: Progressing   Problem: Metabolic: Goal: Ability to maintain appropriate glucose levels will improve Outcome: Progressing   Problem: Nutritional: Goal: Maintenance of adequate nutrition will improve Outcome: Progressing Goal: Progress toward achieving an optimal weight will improve Outcome: Progressing   Problem: Skin Integrity: Goal: Risk for impaired skin integrity will decrease Outcome: Progressing   Problem: Tissue Perfusion: Goal: Adequacy of tissue perfusion will improve Outcome: Progressing

## 2023-06-10 NOTE — Plan of Care (Signed)
  Problem: Education: Goal: Knowledge of General Education information will improve Description: Including pain rating scale, medication(s)/side effects and non-pharmacologic comfort measures Outcome: Progressing   Problem: Health Behavior/Discharge Planning: Goal: Ability to manage health-related needs will improve Outcome: Progressing   Problem: Clinical Measurements: Goal: Ability to maintain clinical measurements within normal limits will improve Outcome: Progressing Goal: Will remain free from infection Outcome: Progressing Goal: Diagnostic test results will improve Outcome: Progressing Goal: Respiratory complications will improve Outcome: Progressing Goal: Cardiovascular complication will be avoided Outcome: Progressing   Problem: Activity: Goal: Risk for activity intolerance will decrease Outcome: Progressing   Problem: Nutrition: Goal: Adequate nutrition will be maintained Outcome: Progressing   Problem: Coping: Goal: Level of anxiety will decrease Outcome: Progressing   Problem: Elimination: Goal: Will not experience complications related to bowel motility Outcome: Progressing Goal: Will not experience complications related to urinary retention Outcome: Progressing   Problem: Pain Managment: Goal: General experience of comfort will improve and/or be controlled Outcome: Progressing   Problem: Safety: Goal: Ability to remain free from injury will improve Outcome: Progressing   Problem: Skin Integrity: Goal: Risk for impaired skin integrity will decrease Outcome: Progressing   Problem: Education: Goal: Knowledge of disease or condition will improve Outcome: Progressing Goal: Knowledge of the prescribed therapeutic regimen will improve Outcome: Progressing Goal: Individualized Educational Video(s) Outcome: Progressing   Problem: Activity: Goal: Ability to tolerate increased activity will improve Outcome: Progressing Goal: Will verbalize the  importance of balancing activity with adequate rest periods Outcome: Progressing   Problem: Respiratory: Goal: Ability to maintain a clear airway will improve Outcome: Progressing Goal: Levels of oxygenation will improve Outcome: Progressing Goal: Ability to maintain adequate ventilation will improve Outcome: Progressing   Problem: Activity: Goal: Ability to tolerate increased activity will improve Outcome: Progressing   Problem: Clinical Measurements: Goal: Ability to maintain a body temperature in the normal range will improve Outcome: Progressing   Problem: Respiratory: Goal: Ability to maintain adequate ventilation will improve Outcome: Progressing Goal: Ability to maintain a clear airway will improve Outcome: Progressing   Problem: Education: Goal: Ability to describe self-care measures that may prevent or decrease complications (Diabetes Survival Skills Education) will improve Outcome: Progressing Goal: Individualized Educational Video(s) Outcome: Progressing   Problem: Coping: Goal: Ability to adjust to condition or change in health will improve Outcome: Progressing   Problem: Fluid Volume: Goal: Ability to maintain a balanced intake and output will improve Outcome: Progressing   Problem: Health Behavior/Discharge Planning: Goal: Ability to identify and utilize available resources and services will improve Outcome: Progressing Goal: Ability to manage health-related needs will improve Outcome: Progressing   Problem: Metabolic: Goal: Ability to maintain appropriate glucose levels will improve Outcome: Progressing   Problem: Nutritional: Goal: Maintenance of adequate nutrition will improve Outcome: Progressing Goal: Progress toward achieving an optimal weight will improve Outcome: Progressing   Problem: Skin Integrity: Goal: Risk for impaired skin integrity will decrease Outcome: Progressing   Problem: Tissue Perfusion: Goal: Adequacy of tissue  perfusion will improve Outcome: Progressing

## 2023-06-10 NOTE — Progress Notes (Signed)
 PROGRESS NOTE  [name] YNW:[id number] DOB: 11-29-1945 DOA: 05/30/2023 PCP: Patient, [name]  Subjective: The patient was seen and examined this morning, stable no acute distress -Per nursing staff much better night overnight slept well Hemodynamically stable -satting 97% on 2 L of oxygen    Brief History:  Carla [name] 78 year old female with a history of dementia, COPD, tobacco abuse, hypertension, anxiety, hyperlipidemia, and primary hyperparathyroidism presenting with 3-day history of shortness of breath.  The patient is unable to provide history at this time secondary to being on BiPAP and receiving some hypnotic medications.  History is obtained from review of the medical record and speaking with the patient's family.  At baseline, the patient is pleasantly confused and able to perform her activities of daily living.  The patient continues to smoke 2 packs/day.  Aside from shortness of breath, family states that there has not been any particular complaints.  She has had a nonproductive cough.  There is not been any fevers, vomiting, diarrhea, abdominal pain.  The patient has had a few loose stools without hematochezia or melena.  Because of worsening shortness of breath, the patient was brought to the emergency department for further evaluation and treatment.  Notably, the patient's son states that the patient does not take any of her medications as directed nor use her inhalers as directed at home. In the ED, the patient was afebrile and hemodynamically stable.  Oxygen saturation was in the 60s on room air.  The patient was placed on BiPAP.  WBC 10.5, hemoglobin 18.6, platelets 190.  Sodium 139, potassium 3.2, bicarbonate 25, serum creatinine 1.07.  LFTs were unremarkable.  Chest x-ray showed increased interstitial markings and hyperinflation.  COVID-19 PCR was negative.  RSV was positive.  Lactic acid 2.6>> 1.5.  PCT 0.42.  EKG shows sinus rhythm and nonspecific T wave  changes.  VBG showed 7.3/50 select/60/27.  The patient was started on Solu-Medrol and bronchodilators.   Assessment/Plan:  Acute respiratory failure with hypoxia and hypercarbia  -secondary to COPD and RSV infection  Resolved -Maintaining O2 sat, currently 100% on 3.5 L  -Off BiPAP   -06/02/23 @0530 --increase WOB>>placed back on BiPAP -Initial VBG 7.3/56/60/27 -3/10 chest x-ray--hyperinflation, slight increase interstitial markings -06/02/23--updated son--FULL CODE for now-- Dr. Arbutus Leas discussed concerns regarding her dementia, frailty, poor baseline pulm function if she required intubation/MV>>he will discuss with family regarding wishes -06/02/23--ativan 0.5mg , morphine 1mg  given; lasix 20 mg IV, hydralazine 10 mg; now on BiPAP   -12/05/2023 currently on 3 L of oxygen, satting 93% -agitated, as needed Haldol, Ativan added -12/06/2023 -  -remain on 3 L of oxygen, satting 92%, during the use less of Haldol, Ativan 12/07/2023, more awake, still on 3 L of oxygen, discontinuing Haldol, and Ativan, continue as needed morphine-coercion p.o. intake 12/08/2023-patient was seen and examined-mild agitation, delirium overnight otherwise stable this morning--- to 2 L, satting 92%  -12/09/2023 - remain agitated, confused, still on 2 L of oxygen, satting 92%-added Seroquel, scheduled Xanax  12/10/2023 -Per nursing staff no BM, bowel regimen initiated, Cardizem p.o., maintain O2 sat > 92% on 2 L -active care following, family still unrealistic-full code, full scope of care   12/11/2023 -was seen and examined, somnolent this morning, Reducing Seroquel from 200 to 100 mg p.o. daily, changing Xanax to as needed  Due to poor p.o. intake, gentle IVF w NS @ 50 ml for 10 hours  -leukocytosis noted but patient has  been on IV steroids with taper (obtaining Pro-Cal, UA, chest x-ray)     COPD exacerbation -Resolved acute respiratory failure, stable now,-remain on 2 L of oxygen, satting  > 97% -Continue Pulmicort, e  Brovana -Continue IV Solu-Medrol  >> tapered off. -Continue DuoNebs -PCT 0.42 >>    RSV pneumonitis -Supportive care   Toxic metabolic encephalopathy- with delirium -agitation -Exacerbated by COPD, hypoxia, infection MRI of the brain-reviewed, no acute intracranial normalities -Reducing Seroquel from 200 to 100 mg p.o. nightly -Changing Xanax to as needed 3 times daily -Continue Remeron   Tobacco abuse -Continue NicoDerm patch   Major neurocognitive disorder -With agitation, delirium--- improved with current regimen -Continue home medication of Remeron 30 mg nightly Added Seroquel, and Xanax   Hypokalemia/hypophosphatemia -Repleted   Tachyarrhythmia  -Remained stable on p.o. metoprolol    Prognosis remain poor-due to multiple comorbidities and acute respiratory failure. Palliative care consulted Continue discussion with family regarding CODE STATUS, and goals of care  Long length of stay-specifically due to patient's waxing and waning mentation, waxing and waning respiratory failure - -Patient needs to be off chemical or physical restraint before discharge to SNF  Anticipating discharge to SNF in a.m. if remains stable    Family Communication:   son updated 3/10  Consultants:  none  Code Status:  FULL   DVT Prophylaxis:  [place] Lovenox   Procedures: As Listed in Progress Note Above  Antibiotics: Ceftriaxone Azithro 3/7>> completed      Objective: Vitals:   06/10/23 0800 06/10/23 0829 06/10/23 0900 06/10/23 1000  BP: (!) 112/45 (!) 112/45 (!) 119/41 (!) 128/47  Pulse: 77 80    Resp: (!) 32  (!) 24 (!) 21  Temp: (!) 97.4 F (36.3 C)     TempSrc: Axillary     SpO2: 100%     Weight:      Height:        Intake/Output Summary (Last 24 hours) at 06/10/2023 1111 Last data filed at 06/10/2023 0100 Gross per 24 hour  Intake 300 ml  Output --  Net 300 ml   Weight change:  Exam:        General:  AAO x 1, pleasantly confused, somnolent  HEENT:   Normocephalic, PERRL, otherwise with in Normal limits   Neuro:  -Exam, pleasantly confused  CNII-XII intact. , normal motor and sensation, reflexes intact   Lungs:   Clear to auscultation BL, Respirations unlabored,  No wheezes / crackles  Cardio:    S1/S2, RRR, No murmure, No Rubs or Gallops   Abdomen:  Soft, non-tender, bowel sounds active all four quadrants, no guarding or peritoneal signs.  Muscular  skeletal:  Limited exam -global generalized weaknesses - in bed, able to move all 4 extremities,   2+ pulses,  symmetric, No pitting edema  Skin:  Dry, warm to touch, negative for any Rashes,  Wounds: Please see nursing documentation              Data Reviewed: I have personally reviewed following labs and imaging studies Basic Metabolic Panel: Recent Labs  Lab 06/06/23 0442 06/07/23 0500 06/08/23 0437 06/09/23 0358 06/10/23 0758  NA 140 134* 138 141 136  K 3.7 3.5 2.9* 5.1 4.6  CL 100 97* 95* 102 99  CO2 32 29 36* 31 28  GLUCOSE 212* 212* 145* 128* 160*  BUN 27* 26* 39* 43* 40*  CREATININE 0.78 0.73 1.01* 0.95 1.04*  CALCIUM 9.2 9.1 9.2 9.3 8.9  MG  --   --  2.4  --   --      CBC: Recent Labs  Lab 06/06/23 0442 06/07/23 0500 06/08/23 0437 06/09/23 0637 06/10/23 0758  WBC 9.6 10.3 10.4 11.3* 16.9*  NEUTROABS  --   --   --   --  13.8*  HGB 14.8 15.1* 16.0* 16.9* 16.0*  HCT 46.9* 46.7* 49.2* 52.7* 49.1*  MCV 93.1 91.0 91.6 92.9 91.9  PLT 226 203 211 195 157    HbA1C: No results for input(s): "HGBA1C" in the last 72 hours.  Urine analysis:    Component Value Date/Time   COLORURINE AMBER (A) 05/31/2023 0607   APPEARANCEUR CLOUDY (A) 05/31/2023 0607   LABSPEC 1.018 05/31/2023 0607   PHURINE 5.0 05/31/2023 0607   GLUCOSEU NEGATIVE 05/31/2023 0607   HGBUR MODERATE (A) 05/31/2023 0607   BILIRUBINUR NEGATIVE 05/31/2023 0607   BILIRUBINUR negative 08/11/2019 1432   BILIRUBINUR small 06/02/2019 1539   KETONESUR NEGATIVE 05/31/2023 0607   PROTEINUR  >=300 (A) 05/31/2023 0607   UROBILINOGEN 0.2 08/11/2019 1432   UROBILINOGEN 0.2 07/07/2013 1550   NITRITE NEGATIVE 05/31/2023 0607   LEUKOCYTESUR NEGATIVE 05/31/2023 1610   Sepsis Labs:   RSV positive on 05/30/2023  Scheduled Meds:  ALPRAZolam  0.25 mg Oral BID   amLODipine  5 mg Oral Daily   arformoterol  15 mcg Nebulization BID   Chlorhexidine Gluconate Cloth  6 each Topical Daily   enoxaparin (LOVENOX) injection  30 mg Subcutaneous Q24H   insulin aspart  0-6 Units Subcutaneous TID WC   levalbuterol  0.63 mg Nebulization BID   [START ON 06/11/2023] methylPREDNISolone (SOLU-MEDROL) injection  20 mg Intravenous Q24H   metoprolol succinate  50 mg Oral Daily   mirtazapine  30 mg Oral QHS   mouth rinse  15 mL Mouth Rinse 4 times per day   polyethylene glycol  17 g Oral Daily   QUEtiapine  100 mg Oral QHS   revefenacin  175 mcg Nebulization Daily   senna-docusate  1 tablet Oral BID     sodium chloride    Procedures/Studies: MR BRAIN WO CONTRAST Result Date: 06/05/2023 CLINICAL DATA:  Altered mental status EXAM: MRI HEAD WITHOUT CONTRAST TECHNIQUE: Multiplanar, multiecho pulse sequences of the brain and surrounding structures were obtained without intravenous contrast. COMPARISON:  08/07/2004 FINDINGS: Brain: No acute infarct, mass effect or extra-axial collection. No acute or chronic hemorrhage. There is multifocal hyperintense T2-weighted signal within the white matter. Parenchymal volume and CSF spaces are normal. The midline structures are normal. Vascular: Normal flow voids. Skull and upper cervical spine: Normal calvarium and skull base. Visualized upper cervical spine and soft tissues are normal. Sinuses/Orbits:No acute finding IMPRESSION: 1. No acute intracranial abnormality. 2. Findings of chronic small vessel ischemia. Electronically Signed   By: Deatra Robinson M.D.   On: 06/05/2023 20:29   DG CHEST PORT 1 VIEW Result Date: 06/05/2023 CLINICAL DATA:  Shortness of breath. EXAM:  PORTABLE CHEST 1 VIEW COMPARISON:  06/02/2023 FINDINGS: Lung bases are excluded from the field of view. Aortic atherosclerotic calcifications. Stable cardiomediastinal contours. The lungs are hyperinflated with coarsened interstitial markings noted bilaterally. No signs of pleural effusion, interstitial edema or airspace disease. The visualized osseous structures appear intact. IMPRESSION: 1. No acute cardiopulmonary abnormalities. 2. COPD. 3. Aortic Atherosclerosis (ICD10-I70.0). Electronically Signed   By: Signa Kell M.D.   On: 06/05/2023 06:40   ECHOCARDIOGRAM COMPLETE Result Date: 06/02/2023    ECHOCARDIOGRAM REPORT   Patient Name:   Carla [name] Date of Exam: 06/02/2023 Medical  Rec #:  [id number]        Height:       60.0 in Accession #:    [id number]       Weight:       84.9 lb Date of Birth:  January 01, 1946        BSA:          1.297 m Patient Age:    78 years         BP:           171/74 mmHg Patient Gender: F                HR:           96 bpm. [facility] Procedure: 2D Echo, Cardiac Doppler and Color Doppler (Both Spectral and Color            Flow Doppler were utilized during procedure). Indications:    Congestive Heart Failure I50.9  History:        Patient has prior history of Echocardiogram examinations, most                 recent 06/14/2022. COPD; Risk Factors:Hypertension, Current                 Smoker, Dyslipidemia and Diabetes. RSV (respiratory syncytial                 virus pneumonia), Acute on chronic respiratory failure with                 hypoxia (HCC), Dementia.  Sonographer:    Celesta Gentile RCS Referring Phys: [phone] DAVID TAT IMPRESSIONS  1. Left ventricular ejection fraction, by estimation, is 55 to 60%. The left ventricle has normal function. The left ventricle has no regional wall motion abnormalities. There is moderate left ventricular hypertrophy. Left ventricular diastolic parameters are consistent with Grade I diastolic dysfunction (impaired relaxation).  Elevated left atrial pressure.  2. Right ventricular systolic function is normal. The right ventricular size is normal.  3. The mitral valve is normal in structure. No evidence of mitral valve regurgitation. No evidence of mitral stenosis.  4. The tricuspid valve is abnormal.  5. The aortic valve has an indeterminant number of cusps. There is moderate calcification of the aortic valve. There is moderate thickening of the aortic valve. Aortic valve regurgitation is moderate. Aortic valve sclerosis/calcification is present, without any evidence of aortic stenosis. FINDINGS  Left Ventricle: Left ventricular ejection fraction, by estimation, is 55 to 60%. The left ventricle has normal function. The left ventricle has no regional wall motion abnormalities. The left ventricular internal cavity size was normal in size. There is  moderate left ventricular hypertrophy. Left ventricular diastolic parameters are consistent with Grade I diastolic dysfunction (impaired relaxation). Elevated left atrial pressure. Right Ventricle: Not able to assess, patient not able to cooperate with IVC assessment. The right ventricular size is normal. Right vetricular wall thickness was not well visualized. Right ventricular systolic function is normal. Left Atrium: Left atrial size was normal in size. Right Atrium: Right atrial size was normal in size. Pericardium: There is no evidence of pericardial effusion. Mitral Valve: The mitral valve is normal in structure. No evidence of mitral valve regurgitation. No evidence of mitral valve stenosis. Tricuspid Valve: The tricuspid valve is abnormal. Tricuspid valve regurgitation is mild . No evidence of tricuspid stenosis. Aortic Valve: The aortic valve has an indeterminant number of cusps. There is moderate calcification of the aortic  valve. There is moderate thickening of the aortic valve. There is moderate aortic valve annular calcification. Aortic valve regurgitation is moderate. Aortic  regurgitation PHT measures 281 msec. Aortic valve sclerosis/calcification is present, without any evidence of aortic stenosis. Aortic valve mean gradient measures 5.0 mmHg. Aortic valve peak gradient measures 10.1 mmHg. Aortic valve area, by VTI measures 2.36 cm. Pulmonic Valve: The pulmonic valve was not well visualized. Pulmonic valve regurgitation is not visualized. No evidence of pulmonic stenosis. Aorta: The aortic root is normal in size and structure. Venous: The inferior vena cava was not well visualized. IAS/Shunts: No atrial level shunt detected by color flow Doppler.  LEFT VENTRICLE PLAX 2D LVIDd:         3.70 cm   Diastology LVIDs:         2.60 cm   LV e' medial:    5.11 cm/s LV PW:         1.30 cm   LV E/e' medial:  19.8 LV IVS:        1.20 cm   LV e' lateral:   6.74 cm/s LVOT diam:     1.80 cm   LV E/e' lateral: 15.0 LV SV:         64 LV SV Index:   49 LVOT Area:     2.54 cm  RIGHT VENTRICLE RV S prime:     12.45 cm/s TAPSE (M-mode): 1.7 cm LEFT ATRIUM             Index        RIGHT ATRIUM           Index LA diam:        2.75 cm 2.12 cm/m   RA Area:     14.70 cm LA Vol (A2C):   43.7 ml 33.70 ml/m  RA Volume:   35.90 ml  27.68 ml/m LA Vol (A4C):   23.9 ml 18.43 ml/m LA Biplane Vol: 32.8 ml 25.29 ml/m  AORTIC VALVE AV Area (Vmax):    2.35 cm AV Area (Vmean):   2.28 cm AV Area (VTI):     2.36 cm AV Vmax:           159.00 cm/s AV Vmean:          104.000 cm/s AV VTI:            0.271 m AV Peak Grad:      10.1 mmHg AV Mean Grad:      5.0 mmHg LVOT Vmax:         147.00 cm/s LVOT Vmean:        93.100 cm/s LVOT VTI:          0.251 m LVOT/AV VTI ratio: 0.93 AI PHT:            281 msec  AORTA Ao Root diam: 3.30 cm Ao Asc diam:  3.30 cm MITRAL VALVE                TRICUSPID VALVE MV Area (PHT): 4.15 cm     TR Peak grad:   26.4 mmHg MV Decel Time: 183 msec     TR Vmax:        257.00 cm/s MV E velocity: 101.00 cm/s MV A velocity: 148.00 cm/s  SHUNTS MV E/A ratio:  0.68         Systemic VTI:  0.25 m  Systemic Diam: 1.80 cm Dina Rich MD Electronically signed by Dina Rich MD Signature Date/Time: 06/02/2023/3:32:10 PM    Final    DG Chest Port 1 View Result Date: 06/02/2023 CLINICAL DATA:  Shortness of breath and COPD. EXAM: PORTABLE CHEST 1 VIEW COMPARISON:  05/30/2023 FINDINGS: Stable cardiomediastinal contours. Aortic atherosclerosis. Lungs appear hyperinflated. Coarsened interstitial markings. Pulmonary vascular congestion without frank edema. No airspace consolidation, atelectasis or pneumothorax. Visualized osseous structures are intact. IMPRESSION: 1. Pulmonary vascular congestion without frank edema. 2. COPD. Electronically Signed   By: Signa Kell M.D.   On: 06/02/2023 06:52   DG Chest Port 1 View Result Date: 05/30/2023 CLINICAL DATA:  Worsening shortness of breath. History of COPD. Sepsis workup. EXAM: PORTABLE CHEST 1 VIEW COMPARISON:  Portable chest 07/03/2022 FINDINGS: The heart is slightly enlarged. There is increased central vascular prominence. Interval new mild interstitial edema in both lung bases with trace pleural effusions. The lungs are emphysematous with no focal pulmonary consolidation. Findings consistent with mild CHF or fluid overload. The mediastinum is stable. The aorta is heavily calcified. Osteopenia and thoracic spondylosis. No new osseous finding. Multiple overlying monitor wires. IMPRESSION: 1. Findings consistent with mild CHF or fluid overload. 2. Emphysema.  No focal consolidation. 3. Aortic atherosclerosis. Electronically Signed   By: Almira Bar M.D.   On: 05/30/2023 23:08    Lorriane Dehart A Jen Benedict, 55 minutes critical care time spent spent in seeing evaluate patient, reviewing labs, Meds, electronic medical records.   [facility]  If 7PM-7AM, please contact night-coverage [email or web address] Password [email or web address] 06/10/2023, 11:11 AM   LOS: 11 days

## 2023-06-11 DIAGNOSIS — J9621 Acute and chronic respiratory failure with hypoxia: Secondary | ICD-10-CM | POA: Diagnosis not present

## 2023-06-11 DIAGNOSIS — Z515 Encounter for palliative care: Secondary | ICD-10-CM | POA: Diagnosis not present

## 2023-06-11 DIAGNOSIS — Z7189 Other specified counseling: Secondary | ICD-10-CM | POA: Diagnosis not present

## 2023-06-11 LAB — CBC
HCT: 51.9 % — ABNORMAL HIGH (ref 36.0–46.0)
Hemoglobin: 16.5 g/dL — ABNORMAL HIGH (ref 12.0–15.0)
MCH: 29.9 pg (ref 26.0–34.0)
MCHC: 31.8 g/dL (ref 30.0–36.0)
MCV: 94.2 fL (ref 80.0–100.0)
Platelets: 233 10*3/uL (ref 150–400)
RBC: 5.51 MIL/uL — ABNORMAL HIGH (ref 3.87–5.11)
RDW: 14.3 % (ref 11.5–15.5)
WBC: 17.3 10*3/uL — ABNORMAL HIGH (ref 4.0–10.5)
nRBC: 0 % (ref 0.0–0.2)

## 2023-06-11 LAB — GLUCOSE, CAPILLARY
Glucose-Capillary: 100 mg/dL — ABNORMAL HIGH (ref 70–99)
Glucose-Capillary: 157 mg/dL — ABNORMAL HIGH (ref 70–99)
Glucose-Capillary: 242 mg/dL — ABNORMAL HIGH (ref 70–99)
Glucose-Capillary: 86 mg/dL (ref 70–99)

## 2023-06-11 MED ORDER — PREDNISONE 20 MG PO TABS
20.0000 mg | ORAL_TABLET | Freq: Every day | ORAL | Status: DC
  Administered 2023-06-13: 20 mg via ORAL
  Filled 2023-06-11 (×2): qty 1

## 2023-06-11 MED ORDER — METOPROLOL SUCCINATE ER 25 MG PO TB24
25.0000 mg | ORAL_TABLET | Freq: Every day | ORAL | Status: DC
  Administered 2023-06-13: 25 mg via ORAL
  Filled 2023-06-11 (×3): qty 1

## 2023-06-11 MED ORDER — PREDNISONE 10 MG PO TABS: ORAL_TABLET | ORAL | 0 refills | Status: AC

## 2023-06-11 MED ORDER — ACETAMINOPHEN 325 MG PO TABS: 650.0000 mg | ORAL_TABLET | Freq: Four times a day (QID) | ORAL | 0 refills | Status: DC | PRN

## 2023-06-11 MED ORDER — ALPRAZOLAM 0.25 MG PO TABS: 0.2500 mg | ORAL_TABLET | Freq: Two times a day (BID) | ORAL | 0 refills | Status: DC

## 2023-06-11 MED ORDER — QUETIAPINE FUMARATE 100 MG PO TABS: 100.0000 mg | ORAL_TABLET | Freq: Every day | ORAL | 0 refills | Status: DC

## 2023-06-11 MED ORDER — REVEFENACIN 175 MCG/3ML IN SOLN: 175.0000 ug | Freq: Every day | RESPIRATORY_TRACT | 0 refills | Status: DC

## 2023-06-11 MED ORDER — SENNOSIDES-DOCUSATE SODIUM 8.6-50 MG PO TABS: 1.0000 | ORAL_TABLET | Freq: Every day | ORAL | 0 refills | Status: DC

## 2023-06-11 MED ORDER — LEVALBUTEROL HCL 0.63 MG/3ML IN NEBU: 0.6300 mg | INHALATION_SOLUTION | Freq: Two times a day (BID) | RESPIRATORY_TRACT | 12 refills | Status: DC

## 2023-06-11 MED ORDER — ARFORMOTEROL TARTRATE 15 MCG/2ML IN NEBU: 15.0000 ug | INHALATION_SOLUTION | Freq: Two times a day (BID) | RESPIRATORY_TRACT | 0 refills | Status: DC

## 2023-06-11 MED ORDER — NICOTINE 14 MG/24HR TD PT24: 14.0000 mg | MEDICATED_PATCH | Freq: Every day | TRANSDERMAL | 0 refills | Status: DC | PRN

## 2023-06-11 MED ORDER — MIRTAZAPINE 30 MG PO TBDP: 30.0000 mg | ORAL_TABLET | Freq: Every day | ORAL | 2 refills | Status: DC

## 2023-06-11 MED ORDER — METOPROLOL SUCCINATE ER 25 MG PO TB24: 25.0000 mg | ORAL_TABLET | Freq: Every day | ORAL | 0 refills | Status: DC

## 2023-06-11 NOTE — Plan of Care (Signed)
  Problem: Education: Goal: Knowledge of General Education information will improve Description: Including pain rating scale, medication(s)/side effects and non-pharmacologic comfort measures Outcome: Progressing   Problem: Health Behavior/Discharge Planning: Goal: Ability to manage health-related needs will improve Outcome: Progressing   Problem: Clinical Measurements: Goal: Ability to maintain clinical measurements within normal limits will improve Outcome: Progressing Goal: Will remain free from infection Outcome: Progressing Goal: Diagnostic test results will improve Outcome: Progressing Goal: Respiratory complications will improve Outcome: Progressing Goal: Cardiovascular complication will be avoided Outcome: Progressing   Problem: Activity: Goal: Risk for activity intolerance will decrease Outcome: Progressing   Problem: Nutrition: Goal: Adequate nutrition will be maintained Outcome: Progressing   Problem: Coping: Goal: Level of anxiety will decrease Outcome: Progressing   Problem: Elimination: Goal: Will not experience complications related to bowel motility Outcome: Progressing Goal: Will not experience complications related to urinary retention Outcome: Progressing   Problem: Pain Managment: Goal: General experience of comfort will improve and/or be controlled Outcome: Progressing   Problem: Safety: Goal: Ability to remain free from injury will improve Outcome: Progressing   Problem: Skin Integrity: Goal: Risk for impaired skin integrity will decrease Outcome: Progressing   Problem: Education: Goal: Knowledge of disease or condition will improve Outcome: Progressing Goal: Knowledge of the prescribed therapeutic regimen will improve Outcome: Progressing Goal: Individualized Educational Video(s) Outcome: Progressing   Problem: Activity: Goal: Ability to tolerate increased activity will improve Outcome: Progressing Goal: Will verbalize the  importance of balancing activity with adequate rest periods Outcome: Progressing   Problem: Respiratory: Goal: Ability to maintain a clear airway will improve Outcome: Progressing Goal: Levels of oxygenation will improve Outcome: Progressing Goal: Ability to maintain adequate ventilation will improve Outcome: Progressing   Problem: Activity: Goal: Ability to tolerate increased activity will improve Outcome: Progressing   Problem: Clinical Measurements: Goal: Ability to maintain a body temperature in the normal range will improve Outcome: Progressing   Problem: Respiratory: Goal: Ability to maintain adequate ventilation will improve Outcome: Progressing Goal: Ability to maintain a clear airway will improve Outcome: Progressing   Problem: Education: Goal: Ability to describe self-care measures that may prevent or decrease complications (Diabetes Survival Skills Education) will improve Outcome: Progressing Goal: Individualized Educational Video(s) Outcome: Progressing   Problem: Coping: Goal: Ability to adjust to condition or change in health will improve Outcome: Progressing   Problem: Fluid Volume: Goal: Ability to maintain a balanced intake and output will improve Outcome: Progressing   Problem: Health Behavior/Discharge Planning: Goal: Ability to identify and utilize available resources and services will improve Outcome: Progressing Goal: Ability to manage health-related needs will improve Outcome: Progressing   Problem: Metabolic: Goal: Ability to maintain appropriate glucose levels will improve Outcome: Progressing   Problem: Nutritional: Goal: Maintenance of adequate nutrition will improve Outcome: Progressing Goal: Progress toward achieving an optimal weight will improve Outcome: Progressing   Problem: Skin Integrity: Goal: Risk for impaired skin integrity will decrease Outcome: Progressing   Problem: Tissue Perfusion: Goal: Adequacy of tissue  perfusion will improve Outcome: Progressing

## 2023-06-11 NOTE — Progress Notes (Signed)
 Palliative:  Mrs. Carla [name], Carla [name], is sitting up quietly in bed.  She appears acutely/chronically ill and frail.  She has known dementia therefore I do not ask orientation questions.  I believe that she can make her basic needs known.  There is no family at bedside at this time.  I asked Carla [name] if she is ready to return home.  She tells me that she is.  I ask if she would like something to eat or drink and she agrees.  She is able to take/hold applesauce cup and feed herself.  No overt signs and symptoms of aspiration.  Conference with bedside nursing staff and transition of care team related to patient condition, needs, goals of care, disposition.  Plan: Continue full scope/full code.  Return home under the care of family.  Husband Carla [name] states that he would accept long-term life support trach/PEG for [place].  35 minutes  Lillia Carmel, NP Palliative Medicine Team  Team Phone [phone]

## 2023-06-11 NOTE — TOC Progression Note (Signed)
 Transition of Care [facility]) - Progression Note    Patient Details  Name: RYLEI MASELLA MRN: [id number] Date of Birth: 1946/03/19  [facility] CM/SW Contact  Villa Herb, [place] Phone Number: 06/11/2023, 3:55 PM  Clinical Narrative:    CSW spoke to [place] with [facility] who states that she visited pt and they are able to offer a bed. CSW reached out to pts husband to update on bed offer, VM left requesting call back. Insurance Berkley Harvey is pending at this time. TOC to follow.   Expected Discharge Plan: Home/Self Care Barriers to Discharge: Continued Medical Work up  Expected Discharge Plan and Services In-house Referral: Clinical Social Work Discharge Planning Services: CM Consult   Living arrangements for the past 2 months: Single Family Home Expected Discharge Date: 06/12/23                                     Social Determinants of Health (SDOH) Interventions SDOH Screenings   Food Insecurity: Patient Unable To Answer (05/30/2023)  Housing: Patient Unable To Answer (05/30/2023)  Transportation Needs: Patient Unable To Answer (05/30/2023)  Utilities: Patient Unable To Answer (05/30/2023)  Alcohol Screen: Low Risk  (02/24/2020)  Depression (PHQ2-9): Low Risk  (04/05/2020)  Financial Resource Strain: Medium Risk (02/24/2020)  Physical Activity: Insufficiently Active (02/24/2020)  Social Connections: Patient Unable To Answer (05/30/2023)  Stress: No Stress Concern Present (02/24/2020)  Tobacco Use: High Risk (06/04/2023)    Readmission Risk Interventions    06/05/2023   11:03 AM 07/22/2022    1:43 PM  Readmission Risk Prevention Plan  Transportation Screening Complete Complete  HRI or Home Care Consult Complete   Social Work Consult for Recovery Care Planning/Counseling Complete   Palliative Care Screening Complete Complete  Medication Review Oceanographer) Complete

## 2023-06-11 NOTE — Discharge Summary (Signed)
 Physician Discharge Summary   Patient: Carla [name] MRN: [id number] DOB: May 29, 1945  Admit date:     05/30/2023  Discharge date: 06/11/23  Discharge Physician: Kendell Bane   PCP: Patient, [name]   Recommendations at discharge:   Follow-up with PCP in 1-2 weeks Follow-up with palliative care team as soon as possible  Discharge Diagnoses: Principal Problem:   Acute on chronic respiratory failure with hypoxia (HCC) Active Problems:   Essential hypertension   Malnutrition of moderate degree (HCC)   Memory loss   Tobacco abuse   COPD with acute exacerbation (HCC)   Acute respiratory failure with hypoxia and hypercapnia (HCC)   Major neurocognitive disorder (HCC)   RSV (respiratory syncytial virus pneumonia)  Resolved Problems:   * No resolved hospital problems. *  Hospital Course: Carla [name] 78 year old female with a history of dementia, COPD, tobacco abuse, hypertension, anxiety, hyperlipidemia, and primary hyperparathyroidism presenting with 3-day history of shortness of breath.  The patient is unable to provide history at this time secondary to being on BiPAP and receiving some hypnotic medications.  History is obtained from review of the medical record and speaking with the patient's family.  At baseline, the patient is pleasantly confused and able to perform her activities of daily living.  The patient continues to smoke 2 packs/day.  Aside from shortness of breath, family states that there has not been any particular complaints.  She has had a nonproductive cough.  There is not been any fevers, vomiting, diarrhea, abdominal pain.  The patient has had a few loose stools without hematochezia or melena.  Because of worsening shortness of breath, the patient was brought to the emergency department for further evaluation and treatment.  Notably, the patient's son states that the patient does not take any of her medications as directed nor use her inhalers as directed at  home. In the ED, the patient was afebrile and hemodynamically stable.  Oxygen saturation was in the 60s on room air.  The patient was placed on BiPAP.  WBC 10.5, hemoglobin 18.6, platelets 190.  Sodium 139, potassium 3.2, bicarbonate 25, serum creatinine 1.07.  LFTs were unremarkable.  Chest x-ray showed increased interstitial markings and hyperinflation.  COVID-19 PCR was negative.  RSV was positive.  Lactic acid 2.6>> 1.5.  PCT 0.42.  EKG shows sinus rhythm and nonspecific T wave changes.  VBG showed 7.3/50 select/60/27.  The patient was started on Solu-Medrol and bronchodilators.   Acute respiratory failure with hypoxia and hypercarbia  -secondary to COPD and RSV infection   Resolved -Maintaining O2 sats of 96% on 2 L of oxygen   -Off BiPAP x 4 days now     -06/02/23 @0530 --increase WOB>>placed back on BiPAP -Initial VBG 7.3/56/60/27 -3/10 chest x-ray--hyperinflation, slight increase interstitial markings -06/02/23--updated son--FULL CODE for now-- Dr. Arbutus Leas discussed concerns regarding her dementia, frailty, poor baseline pulm function if she required intubation/MV>>he will discuss with family regarding wishes -06/02/23--ativan 0.5mg , morphine 1mg  given; lasix 20 mg IV, hydralazine 10 mg; now on BiPAP   -12/05/2023 currently on 3 L of oxygen, satting 93% -agitated, as needed Haldol, Ativan added -12/06/2023 -  -remain on 3 L of oxygen, satting 92%, during the use less of Haldol, Ativan 12/07/2023, more awake, still on 3 L of oxygen, discontinuing Haldol, and Ativan, continue as needed morphine-coercion p.o. intake 12/08/2023-patient was seen and examined-mild agitation, delirium overnight otherwise stable this morning--- to 2 L, satting 92%   -12/09/2023 - remain agitated, confused, still on  2 L of oxygen, satting 92%-added Seroquel, scheduled Xanax   12/10/2023 -Per nursing staff no BM, bowel regimen initiated, Cardizem p.o., maintain O2 sat > 92% on 2 L -active care following, family still  unrealistic-full code, full scope of care     12/11/2023 -was seen and examined, somnolent this morning, Reducing Seroquel from 200 to 100 mg p.o. daily, changing Xanax to as needed  Due to poor p.o. intake, gentle IVF w NS @ 50 ml for 10 hours  -leukocytosis noted but patient has been on IV steroids with taper (obtaining Pro-Cal, UA, chest x-ray)   12/12/2023, awake alert oriented cooperative, tolerating p.o.  Leukocytosis due to steroids, Pro-Cal, UA, chest x-ray are all reviewed, no signs of infection     COPD exacerbation -Resolved acute respiratory failure, stable now,-remain on 2 L of oxygen, satting  > 97% -Continue Pulmicort, Brovana -Continue IV Solu-Medrol  >> tapering off with p.o. prednisone -Continue DuoNebs -PCT 0.42 >>    RSV pneumonitis -Supportive care   Toxic metabolic encephalopathy- with delirium -agitation -Much improved, at baseline now , alert oriented, cooperative -Exacerbated by COPD, hypoxia, infection MRI of the brain-reviewed, no acute intracranial normalities -Reducing Seroquel from 200 to 100 mg p.o. nightly -Changing Xanax to as needed 3 times daily -Continue Remeron     Tobacco abuse -Continue NicoDerm patch   Major neurocognitive disorder -With agitation, delirium--- improved with current regimen -Continue home medication of Remeron 30 mg nightly Added Seroquel, and Xanax   Hypokalemia/hypophosphatemia -Repleted     Tachyarrhythmia  -Remained stable on p.o. metoprolol       Prognosis remain poor-due to multiple comorbidities -along with cognitive, and respiratory decline Continue discussion with family regarding CODE STATUS, and goals of care   CODE STATUS: Full code    Disposition: Skilled nursing facility Diet recommendation:  Discharge Diet Orders (From admission, onward)     Start     Ordered   06/11/23 0000  Diet - low sodium heart healthy        06/11/23 1021           Carb modified diet DISCHARGE  MEDICATION: Allergies as of 06/11/2023       Reactions   Aricept [donepezil Hcl] Diarrhea   Calcium-containing Compounds Other (See Comments)   Pt has hyperparathyroidism which results in hypercalcemia   Exelon [rivastigmine Tartrate] Rash   Exelon [rivastigmine] Rash        Medication List     STOP taking these medications    gabapentin 100 MG capsule Commonly known as: NEURONTIN   mirtazapine 15 MG tablet Commonly known as: REMERON Replaced by: mirtazapine 30 MG disintegrating tablet       TAKE these medications    acetaminophen 325 MG tablet Commonly known as: TYLENOL Take 2 tablets (650 mg total) by mouth every 6 (six) hours as needed for mild pain (pain score 1-3) (or Fever >/= 101).   ALPRAZolam 0.25 MG tablet Commonly known as: XANAX Take 1 tablet (0.25 mg total) by mouth 2 (two) times daily.   amLODipine 5 MG tablet Commonly known as: NORVASC Take 5 mg by mouth daily.   arformoterol 15 MCG/2ML Nebu Commonly known as: BROVANA Take 2 mLs (15 mcg total) by nebulization 2 (two) times daily.   ASHWAGANDHA PO Take 1 capsule by mouth at bedtime.   levalbuterol 0.63 MG/3ML nebulizer solution Commonly known as: XOPENEX Take 3 mLs (0.63 mg total) by nebulization 2 (two) times daily.   metoprolol succinate 25 MG 24  hr tablet Commonly known as: TOPROL-XL Take 1 tablet (25 mg total) by mouth daily. Start taking on: June 12, 2023   mirtazapine 30 MG disintegrating tablet Commonly known as: REMERON SOL-TAB Take 1 tablet (30 mg total) by mouth at bedtime. Replaces: mirtazapine 15 MG tablet   nicotine 14 mg/24hr patch Commonly known as: NICODERM CQ - dosed in mg/24 hours Place 1 patch (14 mg total) onto the skin daily as needed (nicotine craving).   predniSONE 10 MG tablet Commonly known as: DELTASONE Take 2 tablets (20 mg total) by mouth daily for 3 days, THEN 1 tablet (10 mg total) daily for 3 days, THEN 0.5 tablets (5 mg total) daily for 3 days. Start  taking on: June 11, 2023   QUEtiapine 100 MG tablet Commonly known as: SEROQUEL Take 1 tablet (100 mg total) by mouth at bedtime.   revefenacin 175 MCG/3ML nebulizer solution Commonly known as: YUPELRI Take 3 mLs (175 mcg total) by nebulization daily. Start taking on: June 12, 2023   senna-docusate 8.6-50 MG tablet Commonly known as: Senokot-S Take 1 tablet by mouth at bedtime.               Discharge Care Instructions  (From admission, onward)           Start     Ordered   06/11/23 0000  Discharge wound care:       Comments: Per nursing instructions, Encouraging offload pressure points,, ambulate, and bed reposition every 2 hours   06/11/23 1021            Discharge Exam: Filed Weights   06/03/23 0406 06/04/23 0530 06/05/23 0344  Weight: 41.7 kg 41.7 kg 41.4 kg        General:  AAO x 1,  cooperative, no distress;   HEENT:  Normocephalic, PERRL, otherwise with in Normal limits   Neuro:  CNII-XII intact. , normal motor and sensation, reflexes intact   Lungs:   Clear to auscultation BL, Respirations unlabored,  No wheezes / crackles  Cardio:    S1/S2, RRR, No murmure, No Rubs or Gallops   Abdomen:  Soft, non-tender, bowel sounds active all four quadrants, no guarding or peritoneal signs.  Muscular  skeletal:  Limited exam -global generalized weaknesses - in bed, able to move all 4 extremities,   2+ pulses,  symmetric, No pitting edema  Skin:  Dry, warm to touch, negative for any Rashes,  Wounds: Please see nursing documentation          Condition at discharge: fair  The results of significant diagnostics from this hospitalization (including imaging, microbiology, ancillary and laboratory) are listed below for reference.   Imaging Studies: DG CHEST PORT 1 VIEW Result Date: 06/10/2023 CLINICAL DATA:  Shortness of breath. EXAM: PORTABLE CHEST 1 VIEW COMPARISON:  Chest radiograph dated 06/05/2023. FINDINGS: Left lung base atelectasis. Pneumonia  is not excluded. No pleural effusion or pneumothorax. Stable cardiac silhouette. Atherosclerotic calcification of the aorta. Osteopenia with degenerative changes. No acute osseous pathology. IMPRESSION: Left lung base atelectasis. Pneumonia is not excluded. Electronically Signed   By: Elgie Collard M.D.   On: 06/10/2023 15:48   MR BRAIN WO CONTRAST Result Date: 06/05/2023 CLINICAL DATA:  Altered mental status EXAM: MRI HEAD WITHOUT CONTRAST TECHNIQUE: Multiplanar, multiecho pulse sequences of the brain and surrounding structures were obtained without intravenous contrast. COMPARISON:  08/07/2004 FINDINGS: Brain: No acute infarct, mass effect or extra-axial collection. No acute or chronic hemorrhage. There is multifocal hyperintense T2-weighted signal within the  white matter. Parenchymal volume and CSF spaces are normal. The midline structures are normal. Vascular: Normal flow voids. Skull and upper cervical spine: Normal calvarium and skull base. Visualized upper cervical spine and soft tissues are normal. Sinuses/Orbits:No acute finding IMPRESSION: 1. No acute intracranial abnormality. 2. Findings of chronic small vessel ischemia. Electronically Signed   By: Deatra Robinson M.D.   On: 06/05/2023 20:29   DG CHEST PORT 1 VIEW Result Date: 06/05/2023 CLINICAL DATA:  Shortness of breath. EXAM: PORTABLE CHEST 1 VIEW COMPARISON:  06/02/2023 FINDINGS: Lung bases are excluded from the field of view. Aortic atherosclerotic calcifications. Stable cardiomediastinal contours. The lungs are hyperinflated with coarsened interstitial markings noted bilaterally. No signs of pleural effusion, interstitial edema or airspace disease. The visualized osseous structures appear intact. IMPRESSION: 1. No acute cardiopulmonary abnormalities. 2. COPD. 3. Aortic Atherosclerosis (ICD10-I70.0). Electronically Signed   By: Signa Kell M.D.   On: 06/05/2023 06:40   ECHOCARDIOGRAM COMPLETE Result Date: 06/02/2023    ECHOCARDIOGRAM  REPORT   Patient Name:   Carla [name] Date of Exam: 06/02/2023 Medical Rec #:  [id number]        Height:       60.0 in Accession #:    [id number]       Weight:       84.9 lb Date of Birth:  December 05, 1945        BSA:          1.297 m Patient Age:    78 years         BP:           171/74 mmHg Patient Gender: F                HR:           96 bpm. [facility] Procedure: 2D Echo, Cardiac Doppler and Color Doppler (Both Spectral and Color            Flow Doppler were utilized during procedure). Indications:    Congestive Heart Failure I50.9  History:        Patient has prior history of Echocardiogram examinations, most                 recent 06/14/2022. COPD; Risk Factors:Hypertension, Current                 Smoker, Dyslipidemia and Diabetes. RSV (respiratory syncytial                 virus pneumonia), Acute on chronic respiratory failure with                 hypoxia (HCC), Dementia.  Sonographer:    Celesta Gentile RCS Referring Phys: [phone] DAVID TAT IMPRESSIONS  1. Left ventricular ejection fraction, by estimation, is 55 to 60%. The left ventricle has normal function. The left ventricle has no regional wall motion abnormalities. There is moderate left ventricular hypertrophy. Left ventricular diastolic parameters are consistent with Grade I diastolic dysfunction (impaired relaxation). Elevated left atrial pressure.  2. Right ventricular systolic function is normal. The right ventricular size is normal.  3. The mitral valve is normal in structure. No evidence of mitral valve regurgitation. No evidence of mitral stenosis.  4. The tricuspid valve is abnormal.  5. The aortic valve has an indeterminant number of cusps. There is moderate calcification of the aortic valve. There is moderate thickening of the aortic valve. Aortic valve regurgitation is moderate. Aortic valve sclerosis/calcification is present, without  any evidence of aortic stenosis. FINDINGS  Left Ventricle: Left ventricular ejection fraction, by  estimation, is 55 to 60%. The left ventricle has normal function. The left ventricle has no regional wall motion abnormalities. The left ventricular internal cavity size was normal in size. There is  moderate left ventricular hypertrophy. Left ventricular diastolic parameters are consistent with Grade I diastolic dysfunction (impaired relaxation). Elevated left atrial pressure. Right Ventricle: Not able to assess, patient not able to cooperate with IVC assessment. The right ventricular size is normal. Right vetricular wall thickness was not well visualized. Right ventricular systolic function is normal. Left Atrium: Left atrial size was normal in size. Right Atrium: Right atrial size was normal in size. Pericardium: There is no evidence of pericardial effusion. Mitral Valve: The mitral valve is normal in structure. No evidence of mitral valve regurgitation. No evidence of mitral valve stenosis. Tricuspid Valve: The tricuspid valve is abnormal. Tricuspid valve regurgitation is mild . No evidence of tricuspid stenosis. Aortic Valve: The aortic valve has an indeterminant number of cusps. There is moderate calcification of the aortic valve. There is moderate thickening of the aortic valve. There is moderate aortic valve annular calcification. Aortic valve regurgitation is moderate. Aortic regurgitation PHT measures 281 msec. Aortic valve sclerosis/calcification is present, without any evidence of aortic stenosis. Aortic valve mean gradient measures 5.0 mmHg. Aortic valve peak gradient measures 10.1 mmHg. Aortic valve area, by VTI measures 2.36 cm. Pulmonic Valve: The pulmonic valve was not well visualized. Pulmonic valve regurgitation is not visualized. No evidence of pulmonic stenosis. Aorta: The aortic root is normal in size and structure. Venous: The inferior vena cava was not well visualized. IAS/Shunts: No atrial level shunt detected by color flow Doppler.  LEFT VENTRICLE PLAX 2D LVIDd:         3.70 cm    Diastology LVIDs:         2.60 cm   LV e' medial:    5.11 cm/s LV PW:         1.30 cm   LV E/e' medial:  19.8 LV IVS:        1.20 cm   LV e' lateral:   6.74 cm/s LVOT diam:     1.80 cm   LV E/e' lateral: 15.0 LV SV:         64 LV SV Index:   49 LVOT Area:     2.54 cm  RIGHT VENTRICLE RV S prime:     12.45 cm/s TAPSE (M-mode): 1.7 cm LEFT ATRIUM             Index        RIGHT ATRIUM           Index LA diam:        2.75 cm 2.12 cm/m   RA Area:     14.70 cm LA Vol (A2C):   43.7 ml 33.70 ml/m  RA Volume:   35.90 ml  27.68 ml/m LA Vol (A4C):   23.9 ml 18.43 ml/m LA Biplane Vol: 32.8 ml 25.29 ml/m  AORTIC VALVE AV Area (Vmax):    2.35 cm AV Area (Vmean):   2.28 cm AV Area (VTI):     2.36 cm AV Vmax:           159.00 cm/s AV Vmean:          104.000 cm/s AV VTI:            0.271 m AV Peak Grad:  10.1 mmHg AV Mean Grad:      5.0 mmHg LVOT Vmax:         147.00 cm/s LVOT Vmean:        93.100 cm/s LVOT VTI:          0.251 m LVOT/AV VTI ratio: 0.93 AI PHT:            281 msec  AORTA Ao Root diam: 3.30 cm Ao Asc diam:  3.30 cm MITRAL VALVE                TRICUSPID VALVE MV Area (PHT): 4.15 cm     TR Peak grad:   26.4 mmHg MV Decel Time: 183 msec     TR Vmax:        257.00 cm/s MV E velocity: 101.00 cm/s MV A velocity: 148.00 cm/s  SHUNTS MV E/A ratio:  0.68         Systemic VTI:  0.25 m                             Systemic Diam: 1.80 cm Dina Rich MD Electronically signed by Dina Rich MD Signature Date/Time: 06/02/2023/3:32:10 PM    Final    DG Chest Port 1 View Result Date: 06/02/2023 CLINICAL DATA:  Shortness of breath and COPD. EXAM: PORTABLE CHEST 1 VIEW COMPARISON:  05/30/2023 FINDINGS: Stable cardiomediastinal contours. Aortic atherosclerosis. Lungs appear hyperinflated. Coarsened interstitial markings. Pulmonary vascular congestion without frank edema. No airspace consolidation, atelectasis or pneumothorax. Visualized osseous structures are intact. IMPRESSION: 1. Pulmonary vascular congestion  without frank edema. 2. COPD. Electronically Signed   By: Signa Kell M.D.   On: 06/02/2023 06:52   DG Chest Port 1 View Result Date: 05/30/2023 CLINICAL DATA:  Worsening shortness of breath. History of COPD. Sepsis workup. EXAM: PORTABLE CHEST 1 VIEW COMPARISON:  Portable chest 07/03/2022 FINDINGS: The heart is slightly enlarged. There is increased central vascular prominence. Interval new mild interstitial edema in both lung bases with trace pleural effusions. The lungs are emphysematous with no focal pulmonary consolidation. Findings consistent with mild CHF or fluid overload. The mediastinum is stable. The aorta is heavily calcified. Osteopenia and thoracic spondylosis. No new osseous finding. Multiple overlying monitor wires. IMPRESSION: 1. Findings consistent with mild CHF or fluid overload. 2. Emphysema.  No focal consolidation. 3. Aortic atherosclerosis. Electronically Signed   By: Almira Bar M.D.   On: 05/30/2023 23:08    Microbiology: Results for orders placed or performed during the hospital encounter of 05/30/23  Resp panel by RT-PCR (RSV, Flu A&B, Covid) Anterior Nasal Swab     Status: Abnormal   Collection Time: 05/30/23  7:45 PM   Specimen: Anterior Nasal Swab  Result Value Ref Range Status   SARS Coronavirus 2 by RT PCR NEGATIVE NEGATIVE Final    Comment: (NOTE) SARS-CoV-2 target nucleic acids are NOT DETECTED.  The SARS-CoV-2 RNA is generally detectable in upper respiratory specimens during the acute phase of infection. The lowest concentration of SARS-CoV-2 viral copies this assay can detect is 138 copies/mL. A negative result does not preclude SARS-Cov-2 infection and should not be used as the sole basis for treatment or other patient management decisions. A negative result may occur with  improper specimen collection/handling, submission of specimen other than nasopharyngeal swab, presence of viral mutation(s) within the areas targeted by this assay, and inadequate  number of viral copies(<138 copies/mL). A negative result must be combined with clinical observations,  patient history, and epidemiological information. The expected result is Negative.  Fact Sheet for Patients:  BloggerCourse.com  Fact Sheet for Healthcare Providers:  SeriousBroker.it  This test is no t yet approved or cleared by the [place] FDA and  has been authorized for detection and/or diagnosis of SARS-CoV-2 by FDA under an Emergency Use Authorization (EUA). This EUA will remain  in effect (meaning this test can be used) for the duration of the COVID-19 declaration under Section 564(b)(1) of the Act, 21 U.S.C.section 360bbb-3(b)(1), unless the authorization is terminated  or revoked sooner.       Influenza A by PCR NEGATIVE NEGATIVE Final   Influenza B by PCR NEGATIVE NEGATIVE Final    Comment: (NOTE) The Xpert Xpress SARS-CoV-2/FLU/RSV plus assay is intended as an aid in the diagnosis of influenza from Nasopharyngeal swab specimens and should not be used as a sole basis for treatment. Nasal washings and aspirates are unacceptable for Xpert Xpress SARS-CoV-2/FLU/RSV testing.  Fact Sheet for Patients: BloggerCourse.com  Fact Sheet for Healthcare Providers: SeriousBroker.it  This test is not yet approved or cleared by the [place] FDA and has been authorized for detection and/or diagnosis of SARS-CoV-2 by FDA under an Emergency Use Authorization (EUA). This EUA will remain in effect (meaning this test can be used) for the duration of the COVID-19 declaration under Section 564(b)(1) of the Act, 21 U.S.C. section 360bbb-3(b)(1), unless the authorization is terminated or revoked.     Resp Syncytial Virus by PCR POSITIVE (A) NEGATIVE Final    Comment: (NOTE) Fact Sheet for Patients: BloggerCourse.com  Fact Sheet for Healthcare  Providers: SeriousBroker.it  This test is not yet approved or cleared by the [place] FDA and has been authorized for detection and/or diagnosis of SARS-CoV-2 by FDA under an Emergency Use Authorization (EUA). This EUA will remain in effect (meaning this test can be used) for the duration of the COVID-19 declaration under Section 564(b)(1) of the Act, 21 U.S.C. section 360bbb-3(b)(1), unless the authorization is terminated or revoked.  Performed at [facility], [address] [place], [place] [address]   Blood Culture (routine x 2)     Status: None   Collection Time: 05/30/23  7:45 PM   Specimen: BLOOD  Result Value Ref Range Status   Specimen Description BLOOD BLOOD LEFT ARM AC  Final   Special Requests BOTTLES DRAWN AEROBIC AND ANAEROBIC  Final   Culture   Final    NO GROWTH 5 DAYS Performed at [facility], [address] [place], [place] [address]    Report Status 06/04/2023 FINAL  Final  Blood Culture (routine x 2)     Status: None   Collection Time: 05/30/23  8:00 PM   Specimen: BLOOD  Result Value Ref Range Status   Specimen Description BLOOD BLOOD RIGHT HAND  Final   Special Requests BOTTLES DRAWN AEROBIC AND ANAEROBIC  Final   Culture   Final    NO GROWTH 5 DAYS Performed at [facility], [address] [place], [place] [address]    Report Status 06/04/2023 FINAL  Final  MRSA Next Gen by PCR, Nasal     Status: None   Collection Time: 05/30/23  9:47 PM   Specimen: Nasal Mucosa; Nasal Swab  Result Value Ref Range Status   MRSA by PCR Next Gen NOT DETECTED NOT DETECTED Final    Comment: (NOTE) The GeneXpert MRSA Assay (FDA approved for NASAL specimens only), is one component of a comprehensive MRSA colonization surveillance program. It  is not intended to diagnose MRSA infection nor to guide or monitor treatment for MRSA infections. Test performance is not FDA approved in patients less than 18 years old. Performed at [facility], [address], [place], [place] [address]     Labs: CBC: Recent Labs  Lab 06/07/23 0500 06/08/23 0437 06/09/23 0637 06/10/23 0758 06/11/23 0726  WBC 10.3 10.4 11.3* 16.9* 17.3*  NEUTROABS  --   --   --  13.8*  --   HGB 15.1* 16.0* 16.9* 16.0* 16.5*  HCT 46.7* 49.2* 52.7* 49.1* 51.9*  MCV 91.0 91.6 92.9 91.9 94.2  PLT 203 211 195 157 233   Basic Metabolic Panel: Recent Labs  Lab 06/06/23 0442 06/07/23 0500 06/08/23 0437 06/09/23 0358 06/10/23 0758  NA 140 134* 138 141 136  K 3.7 3.5 2.9* 5.1 4.6  CL 100 97* 95* 102 99  CO2 32 29 36* 31 28  GLUCOSE 212* 212* 145* 128* 160*  BUN 27* 26* 39* 43* 40*  CREATININE 0.78 0.73 1.01* 0.95 1.04*  CALCIUM 9.2 9.1 9.2 9.3 8.9  MG  --   --  2.4  --   --    Liver Function Tests: Recent Labs  Lab 06/10/23 0758  AST 18  ALT 39  ALKPHOS 71  BILITOT 0.6  PROT 5.5*  ALBUMIN 3.0*   CBG: Recent Labs  Lab 06/10/23 0738 06/10/23 1135 06/10/23 1614 06/10/23 2131 06/11/23 0807  GLUCAP 176* 110* 233* 107* 100*    Discharge time spent: greater than 55 minutes.  Signed: Kendell Bane, MD [facility] 06/11/2023

## 2023-06-11 NOTE — Progress Notes (Signed)
 Physical Therapy Treatment Patient Details Name: Carla [name] MRN: [id number] DOB: 01-17-1946 Today's Date: 06/11/2023   History of Present Illness Carla [name] is a 78 y.o. female with medical history significant of dementia, hypertension, COPD, history of respiratory failure with extensive intubation, tracheostomy back in May 2024.  She presents to the hospital via EMS due to hypoxia and difficulty breathing.  Unfortunately the patient is unable to give much history due to dementia.  She does not know how long she has been short of breath.  She primarily asks for [place] to let her smoke a cigarette.  Her family had seen her and called EMS.  On their arrival she was severely hypertensive, tachycardic, tachypneic and hypoxic to 60% on room air.  She was immediately placed on a nonrebreather and brought to the hospital for evaluation.    PT Comments  Patient demonstrates slightly labored movement for sitting up at bedside, fair/good return for completing BLE exercises requiring repeated verbal cueing with fair/good carryover, very unsteady on feet with frequent scissoring of legs during gait training and limited mostly due to fatigue and fall risk. Patient tolerated sitting up in chair after therapy. Patient will benefit from continued skilled physical therapy in hospital and recommended venue below to increase strength, balance, endurance for safe ADLs and gait.      If plan is discharge home, recommend the following: A lot of help with walking and/or transfers;A little help with bathing/dressing/bathroom;Help with stairs or ramp for entrance;Assistance with cooking/housework   Can travel by private vehicle     Yes  Equipment Recommendations  Rolling walker (2 wheels)    Recommendations for Other Services       Precautions / Restrictions Precautions Precautions: Fall Restrictions Weight Bearing Restrictions Per Provider Order: No     Mobility  Bed Mobility Overal bed mobility:  Needs Assistance Bed Mobility: Supine to Sit     Supine to sit: Supervision     General bed mobility comments: slightly labored movement    Transfers Overall transfer level: Needs assistance Equipment used: Rolling walker (2 wheels) Transfers: Sit to/from Stand, Bed to chair/wheelchair/BSC Sit to Stand: Contact guard assist   Step pivot transfers: Min assist, Contact guard assist       General transfer comment: unsteady on feet having to lean on armrest of chair for support, required use of RW for safety    Ambulation/Gait Ambulation/Gait assistance: Min assist, Mod assist Gait Distance (Feet): 30 Feet Assistive device: Rolling walker (2 wheels) Gait Pattern/deviations: Decreased step length - right, Decreased step length - left, Decreased stride length, Scissoring Gait velocity: decreased     General Gait Details: slow labored unsteady movement with frequent scissoring of legs requiring Min/mod assist to prevent loss of balance, limited mostly due to fatigue   Stairs             Wheelchair Mobility     Tilt Bed    Modified Rankin (Stroke Patients Only)       Balance Overall balance assessment: Needs assistance Sitting-balance support: Feet supported, No upper extremity supported Sitting balance-Leahy Scale: Good Sitting balance - Comments: seated at EOB   Standing balance support: During functional activity, No upper extremity supported Standing balance-Leahy Scale: Poor Standing balance comment: fair/poor using RW                            Communication Communication Communication: No apparent difficulties  Cognition Arousal: Alert Behavior During  Therapy: WFL for tasks assessed/performed   PT - Cognitive impairments: History of cognitive impairments                         Following commands: Intact      Cueing Cueing Techniques: Verbal cues, Tactile cues  Exercises General Exercises - Lower Extremity Long Arc  Quad: Seated, AROM, Strengthening, Both, 10 reps Hip Flexion/Marching: Seated, AROM, Strengthening, Both, 10 reps Toe Raises: Seated, AROM, Strengthening, Both, 10 reps Heel Raises: Seated, AROM, Strengthening, Both, 10 reps    General Comments        Pertinent Vitals/Pain Pain Assessment Pain Assessment: No/denies pain    Home Living                          Prior Function            PT Goals (current goals can now be found in the care plan section) Acute Rehab PT Goals Patient Stated Goal: return home PT Goal Formulation: With patient Time For Goal Achievement: 06/19/23 Potential to Achieve Goals: Good Progress towards PT goals: Progressing toward goals    Frequency    Min 3X/week      PT Plan      Co-evaluation              AM-PAC PT "6 Clicks" Mobility   Outcome Measure  Help needed turning from your back to your side while in a flat bed without using bedrails?: None Help needed moving from lying on your back to sitting on the side of a flat bed without using bedrails?: A Little Help needed moving to and from a bed to a chair (including a wheelchair)?: A Little Help needed standing up from a chair using your arms (e.g., wheelchair or bedside chair)?: A Little Help needed to walk in hospital room?: A Lot Help needed climbing 3-5 steps with a railing? : A Lot 6 Click Score: 17    End of Session   Activity Tolerance: Patient tolerated treatment well;Patient limited by fatigue Patient left: in chair;with call bell/phone within reach;with chair alarm set Nurse Communication: Mobility status PT Visit Diagnosis: Unsteadiness on feet (R26.81);Other abnormalities of gait and mobility (R26.89);Muscle weakness (generalized) (M62.81)     Time: 1610-9604 PT Time Calculation (min) (ACUTE ONLY): 24 min  Charges:    $Gait Training: 8-22 mins $Therapeutic Exercise: 8-22 mins PT General Charges $$ ACUTE PT VISIT: 1 Visit                      11:50 AM, 06/11/23 Ocie Bob, MPT Physical Therapist with [facility] 336 [phone] office [phone] mobile phone

## 2023-06-11 NOTE — Care Management Important Message (Signed)
 Important Message  Patient Details  Name: Carla [name] MRN: [id number] Date of Birth: 04-22-1945   Important Message Given:  Yes - Medicare IM     Corey Harold 06/11/2023, 1:15 PM

## 2023-06-12 DIAGNOSIS — J9621 Acute and chronic respiratory failure with hypoxia: Secondary | ICD-10-CM | POA: Diagnosis not present

## 2023-06-12 LAB — CBC
HCT: 50.9 % — ABNORMAL HIGH (ref 36.0–46.0)
Hemoglobin: 16.1 g/dL — ABNORMAL HIGH (ref 12.0–15.0)
MCH: 29.8 pg (ref 26.0–34.0)
MCHC: 31.6 g/dL (ref 30.0–36.0)
MCV: 94.3 fL (ref 80.0–100.0)
Platelets: 179 10*3/uL (ref 150–400)
RBC: 5.4 MIL/uL — ABNORMAL HIGH (ref 3.87–5.11)
RDW: 14.5 % (ref 11.5–15.5)
WBC: 14.3 10*3/uL — ABNORMAL HIGH (ref 4.0–10.5)
nRBC: 0 % (ref 0.0–0.2)

## 2023-06-12 LAB — GLUCOSE, CAPILLARY
Glucose-Capillary: 122 mg/dL — ABNORMAL HIGH (ref 70–99)
Glucose-Capillary: 111 mg/dL — ABNORMAL HIGH (ref 70–99)
Glucose-Capillary: 127 mg/dL — ABNORMAL HIGH (ref 70–99)
Glucose-Capillary: 159 mg/dL — ABNORMAL HIGH (ref 70–99)

## 2023-06-12 NOTE — Progress Notes (Signed)
  Progress Note   Patient: Carla [name] BJY:[id number] DOB: 01/26/1946 DOA: 05/30/2023     13 DOS: the patient was seen and examined on 06/12/2023   Brief hospital course: Carla [name] 78 year old female with a history of dementia, COPD, tobacco abuse, hypertension, anxiety, hyperlipidemia, and primary hyperparathyroidism presenting with 3-day history of shortness of breath.  Oxygen saturation was in the 60s on room air.  RSV was positive.    Assessment and Plan:  Acute hypoxic and hypercapnic respiratory failure - Nodular requiring BiPAP, throughout the course of hospital stay was able to be weaned down to 2 L.  Appears to be resolved at this time.  O2 sats in the 90s on 2 L nasal cannula.  Acute COPD exacerbation secondary to RSV infection - Appears resolved at this time.  Continues on p.o. prednisone taper, nebulizers.  Acute delirium with underlying neurocognitive disorder - Patient appears to be at baseline.  Seroquel, Xanax, Remeron on board.  Tobacco abuse - Nicotine patch on board.  Goals of care - Working closely with patient, husband, case management on disposition planning.  Subsequently ready to go to SNF however was pending insurance authorization.  Peer to peer performed today with results unclear at this time.  Patient son's visiting stating that they may be amenable to take patient home.  Awaiting decision about SNF authorization at this time.  Possibility of discharge later today or tomorrow.      Subjective: Patient resting comfortably this morning.  Reported that she was initially agitated however this appears to be resolved at this time.  No fevers, worsening shortness of breath, nausea, vomiting.  Physical Exam: Vitals:   06/12/23 1100 06/12/23 1109 06/12/23 1147 06/12/23 1205  BP:    (!) 98/46  Pulse:      Resp: 17   17  Temp:   (!) 97.5 F (36.4 C)   TempSrc:   Axillary   SpO2:  90%    Weight:      Height:       GENERAL:  Alert, pleasant, no  acute distress, frail HEENT:  EOMI CARDIOVASCULAR:  RRR, no murmurs appreciated RESPIRATORY:  Clear to auscultation, no wheezing, rales, or rhonchi GASTROINTESTINAL:  Soft, nontender, nondistended EXTREMITIES: Thin, no LE edema bilaterally NEURO:  No new focal deficits appreciated SKIN:  No rashes noted PSYCH:  Appropriate mood and affect   Data Reviewed:  There are no new results to review at this time.  Family Communication: None at bedside  Disposition: Status is: Inpatient Remains inpatient appropriate because: Unsafe disposition  Planned Discharge Destination: Skilled nursing facility    Time spent: 36 minutes  Author: Deanna Artis, DO 06/12/2023 3:12 PM  For on call review [email or web address].

## 2023-06-12 NOTE — TOC Progression Note (Signed)
 Transition of Care [facility]) - Progression Note    Patient Details  Name: Carla [name] MRN: [id number] Date of Birth: May 29, 1945  Transition of Care [facility]) CM/SW Contact  Villa Herb, [place] Phone Number: 06/12/2023, 3:58 PM  Clinical Narrative:    CSW spoke with pts spouse at bedside about discharge plan. Pts spouse feels that he would prefer to take pt home with [facility] rather than go to SNF at this time. He states he has family that can assist if needed. Pt does not wear home O2, he is agreeable to this being set up and does not have an agency preference. Pts spouse is agreeable to [facility] being set up prior to D/C and does not have an agency preference. TOC to follow.   Expected Discharge Plan: Home/Self Care Barriers to Discharge: Insurance Authorization  Expected Discharge Plan and Services In-house Referral: Clinical Social Work Discharge Planning Services: CM Consult   Living arrangements for the past 2 months: Single Family Home Expected Discharge Date: 06/12/23                                     Social Determinants of Health (SDOH) Interventions SDOH Screenings   Food Insecurity: Patient Unable To Answer (05/30/2023)  Housing: Patient Unable To Answer (05/30/2023)  Transportation Needs: Patient Unable To Answer (05/30/2023)  Utilities: Patient Unable To Answer (05/30/2023)  Alcohol Screen: Low Risk  (02/24/2020)  Depression (PHQ2-9): Low Risk  (04/05/2020)  Financial Resource Strain: Medium Risk (02/24/2020)  Physical Activity: Insufficiently Active (02/24/2020)  Social Connections: Patient Unable To Answer (05/30/2023)  Stress: No Stress Concern Present (02/24/2020)  Tobacco Use: High Risk (06/04/2023)    Readmission Risk Interventions    06/05/2023   11:03 AM 07/22/2022    1:43 PM  Readmission Risk Prevention Plan  Transportation Screening Complete Complete  HRI or Home Care Consult Complete   Social Work Consult for Recovery Care Planning/Counseling Complete    Palliative Care Screening Complete Complete  Medication Review Oceanographer) Complete

## 2023-06-12 NOTE — Progress Notes (Signed)
 SLP Cancellation Note  Patient Details Name: Carla [name] MRN: [id number] DOB: Feb 19, 1946   Cancelled treatment:       Reason Eval/Treat Not Completed: Other (comment);Fatigue/lethargy limiting ability to participate (Pt sleeping soundly and RN reports that she was awake earlier and consumed nabs and water without incident and best to let her sleep for now. Continue diet as ordered.)  Thank you,  Havery Moros, CCC-SLP [phone]  Carla [name] 06/12/2023, 3:47 PM
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
## 2023-06-12 NOTE — TOC Progression Note (Signed)
 Transition of Care [facility]) - Progression Note    Patient Details  Name: Carla [name] MRN: [id number] Date of Birth: 1945-06-01  Transition [facility]) CM/SW Contact  Villa Herb, [place] Phone Number: 06/12/2023, 1:38 PM  Clinical Narrative:    CSW left VM for pts spouse and granddaughter to review bed offer from [facility]. CSW continues to await call back. CSW updated that pts insurance is requesting peer to peer with Attending MD. CSW updated that MD completed. CSW awaits peer to peer results. TOC to follow.   Expected Discharge Plan: Home/Self Care Barriers to Discharge: Insurance Authorization  Expected Discharge Plan and Services In-house Referral: Clinical Social Work Discharge Planning Services: CM Consult   Living arrangements for the past 2 months: Single Family Home Expected Discharge Date: 06/12/23                                     Social Determinants of Health (SDOH) Interventions SDOH Screenings   Food Insecurity: Patient Unable To Answer (05/30/2023)  Housing: Patient Unable To Answer (05/30/2023)  Transportation Needs: Patient Unable To Answer (05/30/2023)  Utilities: Patient Unable To Answer (05/30/2023)  Alcohol Screen: Low Risk  (02/24/2020)  Depression (PHQ2-9): Low Risk  (04/05/2020)  Financial Resource Strain: Medium Risk (02/24/2020)  Physical Activity: Insufficiently Active (02/24/2020)  Social Connections: Patient Unable To Answer (05/30/2023)  Stress: No Stress Concern Present (02/24/2020)  Tobacco Use: High Risk (06/04/2023)    Readmission Risk Interventions    06/05/2023   11:03 AM 07/22/2022    1:43 PM  Readmission Risk Prevention Plan  Transportation Screening Complete Complete  HRI or Home Care Consult Complete   Social Work Consult for Recovery Care Planning/Counseling Complete   Palliative Care Screening Complete Complete  Medication Review Oceanographer) Complete

## 2023-06-13 ENCOUNTER — Other Ambulatory Visit: Payer: Self-pay | Admitting: *Deleted

## 2023-06-13 DIAGNOSIS — J9621 Acute and chronic respiratory failure with hypoxia: Secondary | ICD-10-CM | POA: Diagnosis not present

## 2023-06-13 LAB — CBC
HCT: 44.4 % (ref 36.0–46.0)
Hemoglobin: 14 g/dL (ref 12.0–15.0)
MCH: 29.4 pg (ref 26.0–34.0)
MCHC: 31.5 g/dL (ref 30.0–36.0)
MCV: 93.3 fL (ref 80.0–100.0)
Platelets: 207 10*3/uL (ref 150–400)
RBC: 4.76 MIL/uL (ref 3.87–5.11)
RDW: 14.4 % (ref 11.5–15.5)
WBC: 12.3 10*3/uL — ABNORMAL HIGH (ref 4.0–10.5)
nRBC: 0 % (ref 0.0–0.2)

## 2023-06-13 LAB — GLUCOSE, CAPILLARY
Glucose-Capillary: 114 mg/dL — ABNORMAL HIGH (ref 70–99)
Glucose-Capillary: 156 mg/dL — ABNORMAL HIGH (ref 70–99)

## 2023-06-13 MED ORDER — QUETIAPINE FUMARATE 100 MG PO TABS: 100.0000 mg | ORAL_TABLET | Freq: Every day | ORAL | 0 refills | Status: DC

## 2023-06-13 MED ORDER — COMPRESSOR/NEBULIZER MISC: 1.0000 | 0 refills | Status: DC

## 2023-06-13 MED ORDER — TAMSULOSIN HCL 0.4 MG PO CAPS: 0.4000 mg | ORAL_CAPSULE | Freq: Every day | ORAL | 0 refills | Status: DC

## 2023-06-13 MED ORDER — MIRTAZAPINE 30 MG PO TBDP: 30.0000 mg | ORAL_TABLET | Freq: Every day | ORAL | 2 refills | Status: DC

## 2023-06-13 MED ORDER — ALPRAZOLAM 0.25 MG PO TABS: 0.2500 mg | ORAL_TABLET | Freq: Two times a day (BID) | ORAL | 0 refills | Status: DC

## 2023-06-13 MED ORDER — TAMSULOSIN HCL 0.4 MG PO CAPS
0.4000 mg | ORAL_CAPSULE | Freq: Every day | ORAL | Status: DC
Start: 2023-06-13 — End: 2023-06-13
  Administered 2023-06-13: 0.4 mg via ORAL
  Filled 2023-06-13: qty 1

## 2023-06-13 NOTE — Progress Notes (Signed)
 Patient alert and oriented to self and place. She is able to verbalize wants and needs. Patient up out of bed, ambulatory with walker. Patient can be non-compliant with wearing O2 when needed even with education. Dr Sharlene Dory aware. Family aware of this also. Much education done about the importance of cessation of smoking although patient states, "the first thing I plan to do is smoke!" Education provided to both family and patient NOT to smoke with O2 around/on. Expressed understanding.

## 2023-06-13 NOTE — Progress Notes (Addendum)
 SATURATION QUALIFICATIONS: (This note is used to comply with regulatory documentation for home oxygen)  Patient Saturations on Room Air at Rest = 91%  Patient Saturations on Room Air while Ambulating = 72%  Patient Saturations on 3 Liters of oxygen while Ambulating = 95%  Please briefly explain why patient needs home oxygen: Desats with activity on room air

## 2023-06-13 NOTE — Plan of Care (Signed)
  Problem: Clinical Measurements: Goal: Ability to maintain clinical measurements within normal limits will improve Outcome: Progressing Goal: Respiratory complications will improve Outcome: Progressing   Problem: Pain Managment: Goal: General experience of comfort will improve and/or be controlled Outcome: Progressing   Problem: Skin Integrity: Goal: Risk for impaired skin integrity will decrease Outcome: Progressing   Problem: Activity: Goal: Ability to tolerate increased activity will improve Outcome: Progressing   Problem: Respiratory: Goal: Ability to maintain a clear airway will improve Outcome: Progressing Goal: Levels of oxygenation will improve Outcome: Progressing   Problem: Coping: Goal: Level of anxiety will decrease Outcome: Not Progressing   Problem: Nutrition: Goal: Adequate nutrition will be maintained Outcome: Adequate for Discharge

## 2023-06-13 NOTE — Discharge Instructions (Signed)
   Providers Accepting New Patients in [place], [place]    [facility] [address]  [place], [place] [address] [phone] Accepts most insurances  [facility] [address] [place], [place] [address] [phone] Accepts most insurances  [facility] [address] [place], [place] [address]  [phone] Must meet requirements  [facility] [address] [place], [place] [address] [phone] Accepts most insurances  [facility] [address]  [place], [place] [address] [phone] Accepts most insurances  [facility] [address]   [place], [place]   [phone] Accepts most insurances  [facility])  [phone] [address]  [place], [place] [address] [phone] Accepts most insurances     [facility] [address]  [place], [place] [address] [phone] Accepts most insurances  [facility] [address] [place], [place] [address] [phone] option 1 Accepts Medicaid and [facility] Medicine [address]  [place], [place] [address] [phone] Accepts most insurances  [place]on Gully, MD [address] [place], [place] [address] [phone] Accepts most insurances  [facility] [address]  [place], [place] [address] [phone] Accepts most insurances  [facility] [phone] [address] [place], [place] [address] [phone] Accepts most insurances  [place], [place] [address] [place], [place] [address] [phone]  Accepts most insurances

## 2023-06-13 NOTE — Progress Notes (Signed)
 Patient alert and oriented to self and place. IV removed with no complications. Patient tolerated PO meds and diet well, appetite good. No complaints of pain, shortness of breath, chest pain, dizziness, nausea or vomiting. Patient up out of bed with supervision, dressed and stated, "I'm ready to go." Went over discharge summary and medication education with patient and patient's son, Loraine Leriche. All questions answered and both expressed full understanding of summary and education with teach back. Patient placed on 3L O2 via nasal cannula at the time of discharge as patient is up walking around and was still applied when patient left hospital. Once again, writer went over with patient and son, Loraine Leriche about wearing O2 when needed (O2 sat less than 90%.) Educated Mark on how and where to get a pulse oximeter to which he expressed full understanding of where to get it and how to use it. Patient discharged with all belongings for home with home O2 tank via car.

## 2023-06-13 NOTE — TOC Transition Note (Signed)
 Transition of Care [facility]) - Discharge Note   Patient Details  Name: Carla [name] MRN: [id number] Date of Birth: 12-Jun-1945  Transition [facility]) CM/SW Contact:  Villa Herb, LCSWA Phone Number: 06/13/2023, 1:48 PM   Clinical Narrative:    CSW spoke to [employer] who states they can arrange home O2 for pt. Tank delivered to room. CSW spoke to [place] with [facility] who accepts [facility] referral. MD placed HH orders. TOC signing off.   Final next level of care: Home w Home Health Services Barriers to Discharge: Barriers Resolved   Patient Goals and CMS Choice Patient states their goals for this hospitalization and ongoing recovery are:: return home CMS Medicare.gov Compare Post Acute Care list provided to:: Patient Represenative (must comment) Choice offered to / list presented to : Spouse      Discharge Placement                       Discharge Plan and Services Additional resources added to the After Visit Summary for   In-house Referral: Clinical Social Work Discharge Planning Services: CM Consult            DME Arranged: Oxygen DME Agency: Patsy Lager       HH Arranged: PT, OT HH Agency: [facility] Date [facility] Agency Contacted: 06/13/23   Representative spoke with at [facility] Agency: Kandee Keen  Social Drivers of Health (SDOH) Interventions SDOH Screenings   Food Insecurity: Patient Unable To Answer (05/30/2023)  Housing: Patient Unable To Answer (05/30/2023)  Transportation Needs: Patient Unable To Answer (05/30/2023)  Utilities: Patient Unable To Answer (05/30/2023)  Alcohol Screen: Low Risk  (02/24/2020)  Depression (PHQ2-9): Low Risk  (04/05/2020)  Financial Resource Strain: Medium Risk (02/24/2020)  Physical Activity: Insufficiently Active (02/24/2020)  Social Connections: Patient Unable To Answer (05/30/2023)  Stress: No Stress Concern Present (02/24/2020)  Tobacco Use: High Risk (06/04/2023)     Readmission Risk Interventions    06/05/2023   11:03 AM  07/22/2022    1:43 PM  Readmission Risk Prevention Plan  Transportation Screening Complete Complete  HRI or Home Care Consult Complete   Social Work Consult for Recovery Care Planning/Counseling Complete   Palliative Care Screening Complete Complete  Medication Review Oceanographer) Complete

## 2023-06-13 NOTE — Consult Note (Signed)
 [facility] Liaison Note  06/13/2023  Carla [name] 1945-08-30 [id number]  Location: RN Hospital Liaison screened the patient remotely at [facility].  Insurance: [employer]   Carla [name] is a 78 y.o. female who is a Primary Care Patient of the last provided noted Gwinda Passe, NP with [facility]. The patient was screened for  readmission hospitalization with noted high risk score for unplanned readmission risk with 1 IP in 6 months.  The patient was assessed for potential Care Management service needs for post hospital transition for care coordination. Review of patient's electronic medical record reveals patient was admitted for Acute on chronic respiratory failure with hypoxia. Pt will discharged with [facility]. Liaison attempted to call both the pt and DPR granddaughter [name]) that were unsuccessful. Based upon the high risk factor and pt discharged from ICU will make a referral to [facility] for care coordination services.  PLAN: Liaison will make a referral for care coordination post hospital prevention readmission follow up calls.   [facility] Care Management/Population Health does not replace or interfere with any arrangements made by the Inpatient Transition of Care team.   For questions contact:   Elliot Cousin, RN, BSN Hospital Liaison [place]   [facility], Population Health Office Hours MTWF  8:00 am-6:00 pm Direct Dial: [phone] mobile [email or web address]

## 2023-06-13 NOTE — Discharge Summary (Signed)
 Physician Discharge Summary   Patient: Carla [name] MRN: [id number] DOB: February 18, 1946  Admit date:     05/30/2023  Discharge date: 06/13/23  Discharge Physician: Deanna Artis   PCP: Patient, [name]   Recommendations at discharge:   At this time patient will be discharged home with home health.  If you experience any symptoms such as fever, vomiting, shortness of breath, chest pain, abdominal pain, or other concerning symptoms, please call your primary care provider or go to the emergency department immediately.  Discharge Diagnoses: Principal Problem:   Acute on chronic respiratory failure with hypoxia (HCC) Active Problems:   Essential hypertension   Malnutrition of moderate degree (HCC)   Memory loss   Tobacco abuse   COPD with acute exacerbation (HCC)   Acute respiratory failure with hypoxia and hypercapnia (HCC)   Major neurocognitive disorder (HCC)   RSV (respiratory syncytial virus pneumonia)  Resolved Problems:   * No resolved hospital problems. *  Hospital Course: CONI HOMESLEY 78 year old female with a history of dementia, COPD, tobacco abuse, hypertension, anxiety, hyperlipidemia, and primary hyperparathyroidism presenting with 3-day history of shortness of breath.  Oxygen saturation was in the 60s on room air.  RSV was positive.    Assessment and Plan: Acute hypoxic and hypercapnic respiratory failure -likely chronic hypoxia - Nodular requiring BiPAP, throughout the course of hospital stay was able to be weaned down to 2 L.  Appears to be resolved at this time.  O2 sats in the 90s on 2 L nasal cannula.  Appears to plateaued around 2-3 L.  Ambulatory O2 sats less than 88% on room air.  Will work to transition patient home with home O2, tank, concentrator prior to discharge.   Acute COPD exacerbation secondary to RSV infection - Appears resolved at this time.     Acute delirium with underlying neurocognitive disorder - Patient appears to be at baseline.   Seroquel, Xanax, Remeron on board.   Tobacco abuse - Nicotine patch on board.  Encourage cessation.   Goals of care - Working closely with patient, husband, sons, case management on disposition planning.  Subsequently ready to go to SNF however was pending insurance authorization.  Peer to peer performed today with results unclear at this time.  Patient son's visiting stating that they may be amenable to take patient home.  Repeat discussion with patient's family all coming to consensus preferring patient come home with home health.  Will work closely with case management to discharge patient home with home health today.  Patient will need to as well.       Consultants: Palliative care Procedures performed: None Disposition: Home health Diet recommendation:  Discharge Diet Orders (From admission, onward)     Start     Ordered   06/13/23 0000  Diet - low sodium heart healthy        06/13/23 1042   06/11/23 0000  Diet - low sodium heart healthy        06/11/23 1021           Cardiac diet DISCHARGE MEDICATION: Allergies as of 06/13/2023       Reactions   Aricept [donepezil Hcl] Diarrhea   Calcium-containing Compounds Other (See Comments)   Pt has hyperparathyroidism which results in hypercalcemia   Exelon [rivastigmine Tartrate] Rash   Exelon [rivastigmine] Rash        Medication List     STOP taking these medications    gabapentin 100 MG capsule Commonly known as:  NEURONTIN   mirtazapine 15 MG tablet Commonly known as: REMERON Replaced by: mirtazapine 30 MG disintegrating tablet       TAKE these medications    acetaminophen 325 MG tablet Commonly known as: TYLENOL Take 2 tablets (650 mg total) by mouth every 6 (six) hours as needed for mild pain (pain score 1-3) (or Fever >/= 101).   ALPRAZolam 0.25 MG tablet Commonly known as: XANAX Take 1 tablet (0.25 mg total) by mouth 2 (two) times daily.   amLODipine 5 MG tablet Commonly known as: NORVASC Take 5  mg by mouth daily.   arformoterol 15 MCG/2ML Nebu Commonly known as: BROVANA Take 2 mLs (15 mcg total) by nebulization 2 (two) times daily.   ASHWAGANDHA PO Take 1 capsule by mouth at bedtime.   levalbuterol 0.63 MG/3ML nebulizer solution Commonly known as: XOPENEX Take 3 mLs (0.63 mg total) by nebulization 2 (two) times daily.   metoprolol succinate 25 MG 24 hr tablet Commonly known as: TOPROL-XL Take 1 tablet (25 mg total) by mouth daily.   mirtazapine 30 MG disintegrating tablet Commonly known as: REMERON SOL-TAB Take 1 tablet (30 mg total) by mouth at bedtime. Replaces: mirtazapine 15 MG tablet   nicotine 14 mg/24hr patch Commonly known as: NICODERM CQ - dosed in mg/24 hours Place 1 patch (14 mg total) onto the skin daily as needed (nicotine craving).   predniSONE 10 MG tablet Commonly known as: DELTASONE Take 2 tablets (20 mg total) by mouth daily for 3 days, THEN 1 tablet (10 mg total) daily for 3 days, THEN 0.5 tablets (5 mg total) daily for 3 days. Start taking on: June 11, 2023   QUEtiapine 100 MG tablet Commonly known as: SEROQUEL Take 1 tablet (100 mg total) by mouth at bedtime.   revefenacin 175 MCG/3ML nebulizer solution Commonly known as: YUPELRI Take 3 mLs (175 mcg total) by nebulization daily.   senna-docusate 8.6-50 MG tablet Commonly known as: Senokot-S Take 1 tablet by mouth at bedtime.   tamsulosin 0.4 MG Caps capsule Commonly known as: FLOMAX Take 1 capsule (0.4 mg total) by mouth daily.               Discharge Care Instructions  (From admission, onward)           Start     Ordered   06/11/23 0000  Discharge wound care:       Comments: Per nursing instructions, Encouraging offload pressure points,, ambulate, and bed reposition every 2 hours   06/11/23 1021            Discharge Exam: Filed Weights   06/03/23 0406 06/04/23 0530 06/05/23 0344  Weight: 41.7 kg 41.7 kg 41.4 kg   GENERAL:  Alert, pleasant, no acute  distress, frail HEENT:  EOMI CARDIOVASCULAR:  RRR, no murmurs appreciated RESPIRATORY:  Clear to auscultation, no wheezing, rales, or rhonchi GASTROINTESTINAL:  Soft, nontender, nondistended EXTREMITIES: Thin, no LE edema bilaterally NEURO:  No new focal deficits appreciated SKIN:  No rashes noted PSYCH:  Appropriate mood and affect   Condition at discharge: improving  The results of significant diagnostics from this hospitalization (including imaging, microbiology, ancillary and laboratory) are listed below for reference.   Imaging Studies: DG CHEST PORT 1 VIEW Result Date: 06/10/2023 CLINICAL DATA:  Shortness of breath. EXAM: PORTABLE CHEST 1 VIEW COMPARISON:  Chest radiograph dated 06/05/2023. FINDINGS: Left lung base atelectasis. Pneumonia is not excluded. No pleural effusion or pneumothorax. Stable cardiac silhouette. Atherosclerotic calcification of the aorta. Osteopenia with  degenerative changes. No acute osseous pathology. IMPRESSION: Left lung base atelectasis. Pneumonia is not excluded. Electronically Signed   By: Elgie Collard M.D.   On: 06/10/2023 15:48   MR BRAIN WO CONTRAST Result Date: 06/05/2023 CLINICAL DATA:  Altered mental status EXAM: MRI HEAD WITHOUT CONTRAST TECHNIQUE: Multiplanar, multiecho pulse sequences of the brain and surrounding structures were obtained without intravenous contrast. COMPARISON:  08/07/2004 FINDINGS: Brain: No acute infarct, mass effect or extra-axial collection. No acute or chronic hemorrhage. There is multifocal hyperintense T2-weighted signal within the white matter. Parenchymal volume and CSF spaces are normal. The midline structures are normal. Vascular: Normal flow voids. Skull and upper cervical spine: Normal calvarium and skull base. Visualized upper cervical spine and soft tissues are normal. Sinuses/Orbits:No acute finding IMPRESSION: 1. No acute intracranial abnormality. 2. Findings of chronic small vessel ischemia. Electronically Signed    By: Deatra Robinson M.D.   On: 06/05/2023 20:29   DG CHEST PORT 1 VIEW Result Date: 06/05/2023 CLINICAL DATA:  Shortness of breath. EXAM: PORTABLE CHEST 1 VIEW COMPARISON:  06/02/2023 FINDINGS: Lung bases are excluded from the field of view. Aortic atherosclerotic calcifications. Stable cardiomediastinal contours. The lungs are hyperinflated with coarsened interstitial markings noted bilaterally. No signs of pleural effusion, interstitial edema or airspace disease. The visualized osseous structures appear intact. IMPRESSION: 1. No acute cardiopulmonary abnormalities. 2. COPD. 3. Aortic Atherosclerosis (ICD10-I70.0). Electronically Signed   By: Signa Kell M.D.   On: 06/05/2023 06:40   ECHOCARDIOGRAM COMPLETE Result Date: 06/02/2023    ECHOCARDIOGRAM REPORT   Patient Name:   ELIANI LECLERE Date of Exam: 06/02/2023 Medical Rec #:  [id number]        Height:       60.0 in Accession #:    [id number]       Weight:       84.9 lb Date of Birth:  02/14/1946        BSA:          1.297 m Patient Age:    78 years         BP:           171/74 mmHg Patient Gender: F                HR:           96 bpm. [facility] Procedure: 2D Echo, Cardiac Doppler and Color Doppler (Both Spectral and Color            Flow Doppler were utilized during procedure). Indications:    Congestive Heart Failure I50.9  History:        Patient has prior history of Echocardiogram examinations, most                 recent 06/14/2022. COPD; Risk Factors:Hypertension, Current                 Smoker, Dyslipidemia and Diabetes. RSV (respiratory syncytial                 virus pneumonia), Acute on chronic respiratory failure with                 hypoxia (HCC), Dementia.  Sonographer:    Celesta Gentile RCS Referring Phys: [phone] DAVID TAT IMPRESSIONS  1. Left ventricular ejection fraction, by estimation, is 55 to 60%. The left ventricle has normal function. The left ventricle has no regional wall motion abnormalities. There is moderate left  ventricular hypertrophy. Left ventricular diastolic parameters  are consistent with Grade I diastolic dysfunction (impaired relaxation). Elevated left atrial pressure.  2. Right ventricular systolic function is normal. The right ventricular size is normal.  3. The mitral valve is normal in structure. No evidence of mitral valve regurgitation. No evidence of mitral stenosis.  4. The tricuspid valve is abnormal.  5. The aortic valve has an indeterminant number of cusps. There is moderate calcification of the aortic valve. There is moderate thickening of the aortic valve. Aortic valve regurgitation is moderate. Aortic valve sclerosis/calcification is present, without any evidence of aortic stenosis. FINDINGS  Left Ventricle: Left ventricular ejection fraction, by estimation, is 55 to 60%. The left ventricle has normal function. The left ventricle has no regional wall motion abnormalities. The left ventricular internal cavity size was normal in size. There is  moderate left ventricular hypertrophy. Left ventricular diastolic parameters are consistent with Grade I diastolic dysfunction (impaired relaxation). Elevated left atrial pressure. Right Ventricle: Not able to assess, patient not able to cooperate with IVC assessment. The right ventricular size is normal. Right vetricular wall thickness was not well visualized. Right ventricular systolic function is normal. Left Atrium: Left atrial size was normal in size. Right Atrium: Right atrial size was normal in size. Pericardium: There is no evidence of pericardial effusion. Mitral Valve: The mitral valve is normal in structure. No evidence of mitral valve regurgitation. No evidence of mitral valve stenosis. Tricuspid Valve: The tricuspid valve is abnormal. Tricuspid valve regurgitation is mild . No evidence of tricuspid stenosis. Aortic Valve: The aortic valve has an indeterminant number of cusps. There is moderate calcification of the aortic valve. There is moderate  thickening of the aortic valve. There is moderate aortic valve annular calcification. Aortic valve regurgitation is moderate. Aortic regurgitation PHT measures 281 msec. Aortic valve sclerosis/calcification is present, without any evidence of aortic stenosis. Aortic valve mean gradient measures 5.0 mmHg. Aortic valve peak gradient measures 10.1 mmHg. Aortic valve area, by VTI measures 2.36 cm. Pulmonic Valve: The pulmonic valve was not well visualized. Pulmonic valve regurgitation is not visualized. No evidence of pulmonic stenosis. Aorta: The aortic root is normal in size and structure. Venous: The inferior vena cava was not well visualized. IAS/Shunts: No atrial level shunt detected by color flow Doppler.  LEFT VENTRICLE PLAX 2D LVIDd:         3.70 cm   Diastology LVIDs:         2.60 cm   LV e' medial:    5.11 cm/s LV PW:         1.30 cm   LV E/e' medial:  19.8 LV IVS:        1.20 cm   LV e' lateral:   6.74 cm/s LVOT diam:     1.80 cm   LV E/e' lateral: 15.0 LV SV:         64 LV SV Index:   49 LVOT Area:     2.54 cm  RIGHT VENTRICLE RV S prime:     12.45 cm/s TAPSE (M-mode): 1.7 cm LEFT ATRIUM             Index        RIGHT ATRIUM           Index LA diam:        2.75 cm 2.12 cm/m   RA Area:     14.70 cm LA Vol (A2C):   43.7 ml 33.70 ml/m  RA Volume:   35.90 ml  27.68 ml/m LA Vol (  A4C):   23.9 ml 18.43 ml/m LA Biplane Vol: 32.8 ml 25.29 ml/m  AORTIC VALVE AV Area (Vmax):    2.35 cm AV Area (Vmean):   2.28 cm AV Area (VTI):     2.36 cm AV Vmax:           159.00 cm/s AV Vmean:          104.000 cm/s AV VTI:            0.271 m AV Peak Grad:      10.1 mmHg AV Mean Grad:      5.0 mmHg LVOT Vmax:         147.00 cm/s LVOT Vmean:        93.100 cm/s LVOT VTI:          0.251 m LVOT/AV VTI ratio: 0.93 AI PHT:            281 msec  AORTA Ao Root diam: 3.30 cm Ao Asc diam:  3.30 cm MITRAL VALVE                TRICUSPID VALVE MV Area (PHT): 4.15 cm     TR Peak grad:   26.4 mmHg MV Decel Time: 183 msec     TR Vmax:         257.00 cm/s MV E velocity: 101.00 cm/s MV A velocity: 148.00 cm/s  SHUNTS MV E/A ratio:  0.68         Systemic VTI:  0.25 m                             Systemic Diam: 1.80 cm Dina Rich MD Electronically signed by Dina Rich MD Signature Date/Time: 06/02/2023/3:32:10 PM    Final    DG Chest Port 1 View Result Date: 06/02/2023 CLINICAL DATA:  Shortness of breath and COPD. EXAM: PORTABLE CHEST 1 VIEW COMPARISON:  05/30/2023 FINDINGS: Stable cardiomediastinal contours. Aortic atherosclerosis. Lungs appear hyperinflated. Coarsened interstitial markings. Pulmonary vascular congestion without frank edema. No airspace consolidation, atelectasis or pneumothorax. Visualized osseous structures are intact. IMPRESSION: 1. Pulmonary vascular congestion without frank edema. 2. COPD. Electronically Signed   By: Signa Kell M.D.   On: 06/02/2023 06:52   DG Chest Port 1 View Result Date: 05/30/2023 CLINICAL DATA:  Worsening shortness of breath. History of COPD. Sepsis workup. EXAM: PORTABLE CHEST 1 VIEW COMPARISON:  Portable chest 07/03/2022 FINDINGS: The heart is slightly enlarged. There is increased central vascular prominence. Interval new mild interstitial edema in both lung bases with trace pleural effusions. The lungs are emphysematous with no focal pulmonary consolidation. Findings consistent with mild CHF or fluid overload. The mediastinum is stable. The aorta is heavily calcified. Osteopenia and thoracic spondylosis. No new osseous finding. Multiple overlying monitor wires. IMPRESSION: 1. Findings consistent with mild CHF or fluid overload. 2. Emphysema.  No focal consolidation. 3. Aortic atherosclerosis. Electronically Signed   By: Almira Bar M.D.   On: 05/30/2023 23:08    Microbiology: Results for orders placed or performed during the hospital encounter of 05/30/23  Resp panel by RT-PCR (RSV, Flu A&B, Covid) Anterior Nasal Swab     Status: Abnormal   Collection Time: 05/30/23  7:45 PM    Specimen: Anterior Nasal Swab  Result Value Ref Range Status   SARS Coronavirus 2 by RT PCR NEGATIVE NEGATIVE Final    Comment: (NOTE) SARS-CoV-2 target nucleic acids are NOT DETECTED.  The SARS-CoV-2 RNA is generally detectable in  upper respiratory specimens during the acute phase of infection. The lowest concentration of SARS-CoV-2 viral copies this assay can detect is 138 copies/mL. A negative result does not preclude SARS-Cov-2 infection and should not be used as the sole basis for treatment or other patient management decisions. A negative result may occur with  improper specimen collection/handling, submission of specimen other than nasopharyngeal swab, presence of viral mutation(s) within the areas targeted by this assay, and inadequate number of viral copies(<138 copies/mL). A negative result must be combined with clinical observations, patient history, and epidemiological information. The expected result is Negative.  Fact Sheet for Patients:  BloggerCourse.com  Fact Sheet for Healthcare Providers:  SeriousBroker.it  This test is no t yet approved or cleared by the [place] FDA and  has been authorized for detection and/or diagnosis of SARS-CoV-2 by FDA under an Emergency Use Authorization (EUA). This EUA will remain  in effect (meaning this test can be used) for the duration of the COVID-19 declaration under Section 564(b)(1) of the Act, 21 U.S.C.section 360bbb-3(b)(1), unless the authorization is terminated  or revoked sooner.       Influenza A by PCR NEGATIVE NEGATIVE Final   Influenza B by PCR NEGATIVE NEGATIVE Final    Comment: (NOTE) The Xpert Xpress SARS-CoV-2/FLU/RSV plus assay is intended as an aid in the diagnosis of influenza from Nasopharyngeal swab specimens and should not be used as a sole basis for treatment. Nasal washings and aspirates are unacceptable for Xpert Xpress  SARS-CoV-2/FLU/RSV testing.  Fact Sheet for Patients: BloggerCourse.com  Fact Sheet for Healthcare Providers: SeriousBroker.it  This test is not yet approved or cleared by the [place] FDA and has been authorized for detection and/or diagnosis of SARS-CoV-2 by FDA under an Emergency Use Authorization (EUA). This EUA will remain in effect (meaning this test can be used) for the duration of the COVID-19 declaration under Section 564(b)(1) of the Act, 21 U.S.C. section 360bbb-3(b)(1), unless the authorization is terminated or revoked.     Resp Syncytial Virus by PCR POSITIVE (A) NEGATIVE Final    Comment: (NOTE) Fact Sheet for Patients: BloggerCourse.com  Fact Sheet for Healthcare Providers: SeriousBroker.it  This test is not yet approved or cleared by the [place] FDA and has been authorized for detection and/or diagnosis of SARS-CoV-2 by FDA under an Emergency Use Authorization (EUA). This EUA will remain in effect (meaning this test can be used) for the duration of the COVID-19 declaration under Section 564(b)(1) of the Act, 21 U.S.C. section 360bbb-3(b)(1), unless the authorization is terminated or revoked.  Performed at [facility], [address] [place], [place] [address]   Blood Culture (routine x 2)     Status: None   Collection Time: 05/30/23  7:45 PM   Specimen: BLOOD  Result Value Ref Range Status   Specimen Description BLOOD BLOOD LEFT ARM AC  Final   Special Requests BOTTLES DRAWN AEROBIC AND ANAEROBIC  Final   Culture   Final    NO GROWTH 5 DAYS Performed at [facility], [address], [place], [place] [address]    Report Status 06/04/2023 FINAL  Final  Blood Culture (routine x 2)     Status: None   Collection Time: 05/30/23  8:00 PM   Specimen: BLOOD  Result Value Ref Range Status   Specimen Description BLOOD BLOOD RIGHT HAND  Final   Special  Requests BOTTLES DRAWN AEROBIC AND ANAEROBIC  Final   Culture   Final    NO GROWTH 5 DAYS  Performed at [facility], [address], [place], [place] [address]    Report Status 06/04/2023 FINAL  Final  MRSA Next Gen by PCR, Nasal     Status: None   Collection Time: 05/30/23  9:47 PM   Specimen: Nasal Mucosa; Nasal Swab  Result Value Ref Range Status   MRSA by PCR Next Gen NOT DETECTED NOT DETECTED Final    Comment: (NOTE) The GeneXpert MRSA Assay (FDA approved for NASAL specimens only), is one component of a comprehensive MRSA colonization surveillance program. It is not intended to diagnose MRSA infection nor to guide or monitor treatment for MRSA infections. Test performance is not FDA approved in patients less than 60 years old. Performed at [facility], [address], [place], [place] [address]     Labs: CBC: Recent Labs  Lab 06/09/23 [phone] 06/10/23 0758 06/11/23 0726 06/12/23 0858 06/13/23 0540  WBC 11.3* 16.9* 17.3* 14.3* 12.3*  NEUTROABS  --  13.8*  --   --   --   HGB 16.9* 16.0* 16.5* 16.1* 14.0  HCT 52.7* 49.1* 51.9* 50.9* 44.4  MCV 92.9 91.9 94.2 94.3 93.3  PLT 195 157 233 179 207   Basic Metabolic Panel: Recent Labs  Lab 06/07/23 0500 06/08/23 0437 06/09/23 0358 06/10/23 0758  NA 134* 138 141 136  K 3.5 2.9* 5.1 4.6  CL 97* 95* 102 99  CO2 29 36* 31 28  GLUCOSE 212* 145* 128* 160*  BUN 26* 39* 43* 40*  CREATININE 0.73 1.01* 0.95 1.04*  CALCIUM 9.1 9.2 9.3 8.9  MG  --  2.4  --   --    Liver Function Tests: Recent Labs  Lab 06/10/23 0758  AST 18  ALT 39  ALKPHOS 71  BILITOT 0.6  PROT 5.5*  ALBUMIN 3.0*   CBG: Recent Labs  Lab 06/12/23 0809 06/12/23 1137 06/12/23 1615 06/12/23 2138 06/13/23 0752  GLUCAP 122* 111* 127* 159* 114*    Discharge time spent: greater than 30 minutes.  Signed: Deanna Artis, DO [facility] 06/13/2023

## 2023-06-14 ENCOUNTER — Emergency Department (HOSPITAL_COMMUNITY)
Admission: EM | Admit: 2023-06-14 | Discharge: 2023-06-14 | Disposition: A | Discharge status: Home / Self Care | Attending: Emergency Medicine | Admitting: Emergency Medicine

## 2023-06-14 ENCOUNTER — Other Ambulatory Visit: Payer: Self-pay

## 2023-06-14 ENCOUNTER — Emergency Department (HOSPITAL_COMMUNITY)

## 2023-06-14 ENCOUNTER — Encounter (HOSPITAL_COMMUNITY): Payer: Self-pay | Admitting: Emergency Medicine

## 2023-06-14 DIAGNOSIS — R456 Violent behavior: Secondary | ICD-10-CM | POA: Diagnosis not present

## 2023-06-14 DIAGNOSIS — I700 Atherosclerosis of aorta: Secondary | ICD-10-CM | POA: Diagnosis not present

## 2023-06-14 DIAGNOSIS — R0789 Other chest pain: Secondary | ICD-10-CM | POA: Insufficient documentation

## 2023-06-14 DIAGNOSIS — R079 Chest pain, unspecified: Secondary | ICD-10-CM | POA: Diagnosis not present

## 2023-06-14 DIAGNOSIS — J449 Chronic obstructive pulmonary disease, unspecified: Secondary | ICD-10-CM | POA: Insufficient documentation

## 2023-06-14 DIAGNOSIS — R110 Nausea: Secondary | ICD-10-CM | POA: Insufficient documentation

## 2023-06-14 DIAGNOSIS — Z79899 Other long term (current) drug therapy: Secondary | ICD-10-CM | POA: Insufficient documentation

## 2023-06-14 DIAGNOSIS — F0390 Unspecified dementia without behavioral disturbance: Secondary | ICD-10-CM | POA: Insufficient documentation

## 2023-06-14 DIAGNOSIS — R918 Other nonspecific abnormal finding of lung field: Secondary | ICD-10-CM | POA: Diagnosis not present

## 2023-06-14 DIAGNOSIS — I10 Essential (primary) hypertension: Secondary | ICD-10-CM | POA: Insufficient documentation

## 2023-06-14 DIAGNOSIS — J181 Lobar pneumonia, unspecified organism: Secondary | ICD-10-CM | POA: Diagnosis not present

## 2023-06-14 DIAGNOSIS — J189 Pneumonia, unspecified organism: Secondary | ICD-10-CM

## 2023-06-14 LAB — CBC
HCT: 48.9 % — ABNORMAL HIGH (ref 36.0–46.0)
Hemoglobin: 16.1 g/dL — ABNORMAL HIGH (ref 12.0–15.0)
MCH: 30.5 pg (ref 26.0–34.0)
MCHC: 32.9 g/dL (ref 30.0–36.0)
MCV: 92.6 fL (ref 80.0–100.0)
Platelets: 234 10*3/uL (ref 150–400)
RBC: 5.28 MIL/uL — ABNORMAL HIGH (ref 3.87–5.11)
RDW: 14.6 % (ref 11.5–15.5)
WBC: 16.8 10*3/uL — ABNORMAL HIGH (ref 4.0–10.5)
nRBC: 0 % (ref 0.0–0.2)

## 2023-06-14 LAB — BASIC METABOLIC PANEL
Anion gap: 10 (ref 5–15)
BUN: 33 mg/dL — ABNORMAL HIGH (ref 8–23)
CO2: 26 mmol/L (ref 22–32)
Calcium: 9.7 mg/dL (ref 8.9–10.3)
Chloride: 102 mmol/L (ref 98–111)
Creatinine, Ser: 1.16 mg/dL — ABNORMAL HIGH (ref 0.44–1.00)
GFR, Estimated: 48 mL/min — ABNORMAL LOW (ref >60)
Glucose, Bld: 116 mg/dL — ABNORMAL HIGH (ref 70–99)
Potassium: 3.5 mmol/L (ref 3.5–5.1)
Sodium: 138 mmol/L (ref 135–145)

## 2023-06-14 LAB — TROPONIN I (HIGH SENSITIVITY)
Troponin I (High Sensitivity): 36 ng/L — ABNORMAL HIGH (ref <18)
Troponin I (High Sensitivity): 48 ng/L — ABNORMAL HIGH (ref <18)

## 2023-06-14 MED ORDER — ONDANSETRON 4 MG PO TBDP
4.0000 mg | ORAL_TABLET | Freq: Once | ORAL | Status: DC
  Filled 2023-06-14: qty 1

## 2023-06-14 MED ORDER — ONDANSETRON HCL 4 MG/2ML IJ SOLN: 4.0000 mg | Freq: Once | INTRAMUSCULAR | Status: DC

## 2023-06-14 MED ORDER — AZITHROMYCIN 250 MG PO TABS
500.0000 mg | ORAL_TABLET | Freq: Once | ORAL | Status: AC
Start: 2023-06-14 — End: 2023-06-14
  Administered 2023-06-14: 500 mg via ORAL
  Filled 2023-06-14 (×2): qty 2

## 2023-06-14 MED ORDER — AZITHROMYCIN 250 MG PO TABS: 250.0000 mg | ORAL_TABLET | Freq: Every day | ORAL | 0 refills | Status: DC

## 2023-06-14 NOTE — ED Notes (Signed)
 Antibiotic provided for son to administer

## 2023-06-14 NOTE — ED Triage Notes (Signed)
 Pt bib EMS after family called stating pt was c/o CP. Per EMS, pt unable to state whether she was having any CP or not. EMS was unable to obtain any VS because they stated pt was combative with them. Per family, pt has advanced dementia. Pt c/o indigestion as she was being taken to room on stretcher with EMS. Pt discharged form hospital yesterday.

## 2023-06-14 NOTE — ED Notes (Signed)
 ED Provider at bedside.

## 2023-06-14 NOTE — ED Provider Notes (Signed)
 [place] EMERGENCY DEPARTMENT AT [facility] Provider Note   CSN: [id number] Arrival date & time: 06/14/23  0041     History  Chief Complaint  Patient presents with   Gastroesophageal Reflux    Carla [name] is a 78 y.o. female.  This patient is a 78 year old female with past medical history of advanced dementia, hypertension, COPD.  Patient discharged earlier today after being admitted for a COPD exacerbation.  Patient was at home with family when she developed the sudden onset of sharp pain to the center of her chest.  She also felt nauseated.  Vital signs unable to be obtained secondary to patient noncompliance.  Majority of history taken from the patient's son who was present at bedside.  The history is provided by the patient and a relative.       Home Medications Prior to Admission medications   Medication Sig Start Date End Date Taking? Authorizing Provider  acetaminophen (TYLENOL) 325 MG tablet Take 2 tablets (650 mg total) by mouth every 6 (six) hours as needed for mild pain (pain score 1-3) (or Fever >/= 101). 06/11/23   Shahmehdi, Gemma Payor, MD  ALPRAZolam Prudy Feeler) 0.25 MG tablet Take 1 tablet (0.25 mg total) by mouth 2 (two) times daily. 06/13/23   Deanna Artis, DO  amLODipine (NORVASC) 5 MG tablet Take 5 mg by mouth daily.    [provider]  arformoterol (BROVANA) 15 MCG/2ML NEBU Take 2 mLs (15 mcg total) by nebulization 2 (two) times daily. 06/11/23   Shahmehdi, Gemma Payor, MD  ASHWAGANDHA PO Take 1 capsule by mouth at bedtime.    [provider]  levalbuterol Pauline Aus) 0.63 MG/3ML nebulizer solution Take 3 mLs (0.63 mg total) by nebulization 2 (two) times daily. 06/11/23   Shahmehdi, Gemma Payor, MD  metoprolol succinate (TOPROL-XL) 25 MG 24 hr tablet Take 1 tablet (25 mg total) by mouth daily. 06/12/23   Shahmehdi, Gemma Payor, MD  mirtazapine (REMERON SOL-TAB) 30 MG disintegrating tablet Take 1 tablet (30 mg total) by mouth at bedtime. 06/13/23    Deanna Artis, DO  Nebulizers (COMPRESSOR/NEBULIZER) MISC 1 each by Does not apply route as directed. 06/13/23   Deanna Artis, DO  nicotine (NICODERM CQ - DOSED IN MG/24 HOURS) 14 mg/24hr patch Place 1 patch (14 mg total) onto the skin daily as needed (nicotine craving). 06/11/23   Shahmehdi, Gemma Payor, MD  predniSONE (DELTASONE) 10 MG tablet Take 2 tablets (20 mg total) by mouth daily for 3 days, THEN 1 tablet (10 mg total) daily for 3 days, THEN 0.5 tablets (5 mg total) daily for 3 days. 06/11/23 06/20/23  ShahmehdiGemma Payor, MD  QUEtiapine (SEROQUEL) 100 MG tablet Take 1 tablet (100 mg total) by mouth at bedtime. 06/13/23   Deanna Artis, DO  revefenacin (YUPELRI) 175 MCG/3ML nebulizer solution Take 3 mLs (175 mcg total) by nebulization daily. 06/12/23   Shahmehdi, Gemma Payor, MD  senna-docusate (SENOKOT-S) 8.6-50 MG tablet Take 1 tablet by mouth at bedtime. 06/11/23   Shahmehdi, Gemma Payor, MD  tamsulosin (FLOMAX) 0.4 MG CAPS capsule Take 1 capsule (0.4 mg total) by mouth daily. 06/13/23   Deanna Artis, DO      Allergies    Aricept Palma Holter hcl], Calcium-containing compounds, Exelon [rivastigmine tartrate], and Exelon [rivastigmine]    Review of Systems   Review of Systems  Unable to perform ROS: Dementia    Physical Exam Updated Vital Signs BP (!) 192/81 (BP Location: Left Arm)  Pulse 85   Temp 97.6 F (36.4 C) (Oral)   Resp (!) 22   Ht 5' (1.524 m)   Wt 41.4 kg   SpO2 92%   BMI 17.83 kg/m  Physical Exam Vitals and nursing note reviewed.  Constitutional:      General: She is not in acute distress.    Appearance: She is well-developed. She is not diaphoretic.  HENT:     Head: Normocephalic and atraumatic.  Cardiovascular:     Rate and Rhythm: Normal rate and regular rhythm.     Heart sounds: No murmur heard.    No friction rub. No gallop.  Pulmonary:     Effort: Pulmonary effort is normal. No respiratory distress.     Breath sounds: Normal breath sounds. No wheezing.   Abdominal:     General: Bowel sounds are normal. There is no distension.     Palpations: Abdomen is soft.     Tenderness: There is no abdominal tenderness.  Musculoskeletal:        General: Normal range of motion.     Cervical back: Normal range of motion and neck supple.  Skin:    General: Skin is warm and dry.  Neurological:     General: No focal deficit present.     Mental Status: She is alert and oriented to person, place, and time.     ED Results / Procedures / Treatments   Labs (all labs ordered are listed, but only abnormal results are displayed) Labs Reviewed  CBC - Abnormal; Notable for the following components:      Result Value   WBC 16.8 (*)    RBC 5.28 (*)    Hemoglobin 16.1 (*)    HCT 48.9 (*)    All other components within normal limits  BASIC METABOLIC PANEL  TROPONIN I (HIGH SENSITIVITY)    EKG EKG Interpretation Date/Time:  Saturday June 14 2023 00:52:54 EDT Ventricular Rate:  91 PR Interval:  148 QRS Duration:  142 QT Interval:  355 QTC Calculation: 381 R Axis:   97  Text Interpretation: Sinus rhythm Supraventricular bigeminy LAE, consider biatrial enlargement Confirmed by Geoffery Lyons ([id number]) on 06/14/2023 1:05:22 AM  Radiology No results found.  Procedures Procedures    Medications Ordered in ED Medications  ondansetron (ZOFRAN-ODT) disintegrating tablet 4 mg (has no administration in time range)    ED Course/ Medical Decision Making/ A&P  Patient is a 78 year old female with history of dementia and COPD.  Patient presenting with complaints of chest pain as described in the HPI.  Patient arrives here with stable vital signs and is afebrile.  Physical examination basically unremarkable.  Laboratory studies obtained including CBC, basic metabolic panel, and troponin x 2.  She does have a mildly elevated troponin, however this is consistent with her baseline.  Chest x-ray showing questionable developing right lower lung airspace  opacity.  Patient to be treated with Zithromax.  Patient refusing nausea medications here in the ER and seems to be feeling better.  She is now resting comfortably.  I have discussed the results of the test with the patient's son who is present at bedside.  He feels comfortable with her going home.  I will prescribe outpatient Zithromax and have patient follow-up as needed.  Final Clinical Impression(s) / ED Diagnoses Final diagnoses:  None    Rx / DC Orders ED Discharge Orders     None         Geoffery Lyons, MD 06/14/23 [phone]

## 2023-06-14 NOTE — ED Notes (Signed)
 Patient refused to take medication by staff members.

## 2023-06-14 NOTE — Discharge Instructions (Signed)
 Begin taking Zithromax as prescribed.  Return to the emergency department if you develop any new and/or concerning issues.

## 2023-06-15 ENCOUNTER — Telehealth: Payer: Self-pay

## 2023-06-15 NOTE — Telephone Encounter (Signed)
 Consult from Friday night son requested to speak to someone more about planning and ALF resources. Called Mr Bunte, he was at he pharmacy and needed some time to speak to his father. Told son Loraine Leriche this RNCM would be available at number provided until 6pm today.

## 2023-06-16 ENCOUNTER — Telehealth: Payer: Self-pay | Admitting: *Deleted

## 2023-06-16 DIAGNOSIS — J449 Chronic obstructive pulmonary disease, unspecified: Secondary | ICD-10-CM | POA: Diagnosis not present

## 2023-06-16 NOTE — Progress Notes (Unsigned)
 Complex Care Management Note Care Guide Note  06/16/2023 Name: Carla [name] MRN: [id number] DOB: Jul 29, 1945   Complex Care Management Outreach Attempts: An unsuccessful telephone outreach was attempted today to offer the patient information about available complex care management services.  Follow Up Plan:  Additional outreach attempts will be made to offer the patient complex care management information and services.   Encounter Outcome:  No Answer  Gwenevere Ghazi  [facility]  [facility], [facility] Guide  Direct Dial: [phone]  Fax [phone]

## 2023-06-17 ENCOUNTER — Telehealth (HOSPITAL_COMMUNITY): Payer: Self-pay | Admitting: Pharmacy Technician

## 2023-06-17 NOTE — Telephone Encounter (Signed)
 Pharmacy Patient Advocate Encounter  Received notification from [facility] [employer] that Prior Authorization for Yupelri 175MCG/3ML solution has been APPROVED from 06/17/2023 to 03/24/2098   PA #/Case ID/Reference #: [id number]

## 2023-06-17 NOTE — Telephone Encounter (Signed)
 Pharmacy Patient Advocate Encounter   Received notification from Fax that prior authorization for Yupelri 175MCG/3ML solution is required/requested.   Insurance verification completed.   The patient is insured through [facility] [employer]VANT[employer]/RX ADVANCE .   Per test claim: PA required; PA submitted to above mentioned insurance via CoverMyMeds Key/confirmation #/EOC [id number] Status is pending

## 2023-06-18 NOTE — Progress Notes (Unsigned)
 Complex Care Management Note Care Guide Note  06/18/2023 Name: Carla [name] MRN: [id number] DOB: 04/23/1945   Complex Care Management Outreach Attempts: A second unsuccessful outreach was attempted today to offer the patient with information about available complex care management services.  Follow Up Plan:  Additional outreach attempts will be made to offer the patient complex care management information and services.   Encounter Outcome:  No Answer  Gwenevere Ghazi  [facility]  [facility], [facility] Guide  Direct Dial: [phone]  Fax [phone]

## 2023-06-19 NOTE — Progress Notes (Signed)
 Complex Care Management Note Care Guide Note  06/19/2023 Name: LASHYA PASSE MRN: [id number] DOB: 12/26/1945   Complex Care Management Outreach Attempts: A third unsuccessful outreach was attempted today to offer the patient with information about available complex care management services.  Follow Up Plan:  No further outreach attempts will be made at this time. We have been unable to contact the patient to offer or enroll patient in complex care management services.  Encounter Outcome:  No Answer  Gwenevere Ghazi  [facility]  [facility], [facility] Guide  Direct Dial: [phone]  Fax [phone]

## 2023-06-26 DIAGNOSIS — J9621 Acute and chronic respiratory failure with hypoxia: Secondary | ICD-10-CM | POA: Diagnosis not present

## 2023-06-26 DIAGNOSIS — J441 Chronic obstructive pulmonary disease with (acute) exacerbation: Secondary | ICD-10-CM | POA: Diagnosis not present

## 2023-07-17 DIAGNOSIS — J449 Chronic obstructive pulmonary disease, unspecified: Secondary | ICD-10-CM | POA: Diagnosis not present

## 2023-09-06 ENCOUNTER — Other Ambulatory Visit: Payer: Self-pay

## 2023-09-06 ENCOUNTER — Emergency Department (HOSPITAL_COMMUNITY)

## 2023-09-06 ENCOUNTER — Inpatient Hospital Stay (HOSPITAL_COMMUNITY)
Admission: EM | Admit: 2023-09-06 | Discharge: 2023-09-23 | DRG: 871 | Disposition: E | Source: Other Acute Inpatient Hospital | Attending: Internal Medicine | Admitting: Internal Medicine

## 2023-09-06 DIAGNOSIS — I21A1 Myocardial infarction type 2: Secondary | ICD-10-CM | POA: Diagnosis not present

## 2023-09-06 DIAGNOSIS — F05 Delirium due to known physiological condition: Secondary | ICD-10-CM | POA: Diagnosis present

## 2023-09-06 DIAGNOSIS — J9622 Acute and chronic respiratory failure with hypercapnia: Secondary | ICD-10-CM | POA: Diagnosis not present

## 2023-09-06 DIAGNOSIS — Z79899 Other long term (current) drug therapy: Secondary | ICD-10-CM

## 2023-09-06 DIAGNOSIS — R6521 Severe sepsis with septic shock: Secondary | ICD-10-CM | POA: Diagnosis not present

## 2023-09-06 DIAGNOSIS — J9621 Acute and chronic respiratory failure with hypoxia: Secondary | ICD-10-CM | POA: Diagnosis present

## 2023-09-06 DIAGNOSIS — J690 Pneumonitis due to inhalation of food and vomit: Secondary | ICD-10-CM | POA: Diagnosis not present

## 2023-09-06 DIAGNOSIS — N170 Acute kidney failure with tubular necrosis: Secondary | ICD-10-CM | POA: Diagnosis not present

## 2023-09-06 DIAGNOSIS — J441 Chronic obstructive pulmonary disease with (acute) exacerbation: Secondary | ICD-10-CM | POA: Diagnosis not present

## 2023-09-06 DIAGNOSIS — Z5986 Financial insecurity: Secondary | ICD-10-CM

## 2023-09-06 DIAGNOSIS — E1165 Type 2 diabetes mellitus with hyperglycemia: Secondary | ICD-10-CM | POA: Diagnosis present

## 2023-09-06 DIAGNOSIS — I110 Hypertensive heart disease with heart failure: Secondary | ICD-10-CM | POA: Diagnosis not present

## 2023-09-06 DIAGNOSIS — I351 Nonrheumatic aortic (valve) insufficiency: Secondary | ICD-10-CM | POA: Diagnosis present

## 2023-09-06 DIAGNOSIS — Z711 Person with feared health complaint in whom no diagnosis is made: Secondary | ICD-10-CM | POA: Diagnosis not present

## 2023-09-06 DIAGNOSIS — I5023 Acute on chronic systolic (congestive) heart failure: Secondary | ICD-10-CM | POA: Diagnosis not present

## 2023-09-06 DIAGNOSIS — I2510 Atherosclerotic heart disease of native coronary artery without angina pectoris: Secondary | ICD-10-CM | POA: Diagnosis present

## 2023-09-06 DIAGNOSIS — E43 Unspecified severe protein-calorie malnutrition: Secondary | ICD-10-CM | POA: Diagnosis not present

## 2023-09-06 DIAGNOSIS — I5020 Unspecified systolic (congestive) heart failure: Secondary | ICD-10-CM | POA: Diagnosis not present

## 2023-09-06 DIAGNOSIS — R570 Cardiogenic shock: Secondary | ICD-10-CM | POA: Diagnosis present

## 2023-09-06 DIAGNOSIS — G9341 Metabolic encephalopathy: Secondary | ICD-10-CM | POA: Diagnosis not present

## 2023-09-06 DIAGNOSIS — M810 Age-related osteoporosis without current pathological fracture: Secondary | ICD-10-CM | POA: Diagnosis present

## 2023-09-06 DIAGNOSIS — F02C18 Dementia in other diseases classified elsewhere, severe, with other behavioral disturbance: Secondary | ICD-10-CM | POA: Diagnosis not present

## 2023-09-06 DIAGNOSIS — E1151 Type 2 diabetes mellitus with diabetic peripheral angiopathy without gangrene: Secondary | ICD-10-CM | POA: Diagnosis not present

## 2023-09-06 DIAGNOSIS — R739 Hyperglycemia, unspecified: Secondary | ICD-10-CM | POA: Diagnosis present

## 2023-09-06 DIAGNOSIS — Z515 Encounter for palliative care: Secondary | ICD-10-CM | POA: Diagnosis not present

## 2023-09-06 DIAGNOSIS — E872 Acidosis, unspecified: Secondary | ICD-10-CM | POA: Diagnosis not present

## 2023-09-06 DIAGNOSIS — R4182 Altered mental status, unspecified: Secondary | ICD-10-CM | POA: Diagnosis not present

## 2023-09-06 DIAGNOSIS — J439 Emphysema, unspecified: Secondary | ICD-10-CM | POA: Diagnosis not present

## 2023-09-06 DIAGNOSIS — R6520 Severe sepsis without septic shock: Secondary | ICD-10-CM

## 2023-09-06 DIAGNOSIS — D649 Anemia, unspecified: Secondary | ICD-10-CM | POA: Diagnosis not present

## 2023-09-06 DIAGNOSIS — E7800 Pure hypercholesterolemia, unspecified: Secondary | ICD-10-CM | POA: Diagnosis present

## 2023-09-06 DIAGNOSIS — Z860100 Personal history of colon polyps, unspecified: Secondary | ICD-10-CM

## 2023-09-06 DIAGNOSIS — J440 Chronic obstructive pulmonary disease with acute lower respiratory infection: Secondary | ICD-10-CM | POA: Diagnosis not present

## 2023-09-06 DIAGNOSIS — R0689 Other abnormalities of breathing: Secondary | ICD-10-CM | POA: Diagnosis not present

## 2023-09-06 DIAGNOSIS — Z90711 Acquired absence of uterus with remaining cervical stump: Secondary | ICD-10-CM

## 2023-09-06 DIAGNOSIS — J9691 Respiratory failure, unspecified with hypoxia: Secondary | ICD-10-CM | POA: Diagnosis not present

## 2023-09-06 DIAGNOSIS — I214 Non-ST elevation (NSTEMI) myocardial infarction: Secondary | ICD-10-CM | POA: Diagnosis not present

## 2023-09-06 DIAGNOSIS — N179 Acute kidney failure, unspecified: Secondary | ICD-10-CM | POA: Diagnosis not present

## 2023-09-06 DIAGNOSIS — F0390 Unspecified dementia without behavioral disturbance: Secondary | ICD-10-CM | POA: Diagnosis not present

## 2023-09-06 DIAGNOSIS — R410 Disorientation, unspecified: Secondary | ICD-10-CM | POA: Diagnosis not present

## 2023-09-06 DIAGNOSIS — R0600 Dyspnea, unspecified: Secondary | ICD-10-CM | POA: Diagnosis not present

## 2023-09-06 DIAGNOSIS — D75839 Thrombocytosis, unspecified: Secondary | ICD-10-CM | POA: Diagnosis present

## 2023-09-06 DIAGNOSIS — Z825 Family history of asthma and other chronic lower respiratory diseases: Secondary | ICD-10-CM

## 2023-09-06 DIAGNOSIS — Z66 Do not resuscitate: Secondary | ICD-10-CM | POA: Diagnosis not present

## 2023-09-06 DIAGNOSIS — Z7982 Long term (current) use of aspirin: Secondary | ICD-10-CM

## 2023-09-06 DIAGNOSIS — A419 Sepsis, unspecified organism: Secondary | ICD-10-CM | POA: Diagnosis not present

## 2023-09-06 DIAGNOSIS — I3139 Other pericardial effusion (noninflammatory): Secondary | ICD-10-CM | POA: Diagnosis not present

## 2023-09-06 DIAGNOSIS — R7989 Other specified abnormal findings of blood chemistry: Secondary | ICD-10-CM | POA: Diagnosis present

## 2023-09-06 DIAGNOSIS — Z5320 Procedure and treatment not carried out because of patient's decision for unspecified reasons: Secondary | ICD-10-CM | POA: Diagnosis present

## 2023-09-06 DIAGNOSIS — Z800 Family history of malignant neoplasm of digestive organs: Secondary | ICD-10-CM

## 2023-09-06 DIAGNOSIS — R627 Adult failure to thrive: Secondary | ICD-10-CM | POA: Diagnosis present

## 2023-09-06 DIAGNOSIS — E8729 Other acidosis: Secondary | ICD-10-CM | POA: Diagnosis present

## 2023-09-06 DIAGNOSIS — Z8249 Family history of ischemic heart disease and other diseases of the circulatory system: Secondary | ICD-10-CM

## 2023-09-06 DIAGNOSIS — Z1152 Encounter for screening for COVID-19: Secondary | ICD-10-CM

## 2023-09-06 DIAGNOSIS — F03C11 Unspecified dementia, severe, with agitation: Secondary | ICD-10-CM | POA: Diagnosis not present

## 2023-09-06 DIAGNOSIS — R918 Other nonspecific abnormal finding of lung field: Secondary | ICD-10-CM | POA: Diagnosis not present

## 2023-09-06 DIAGNOSIS — G301 Alzheimer's disease with late onset: Secondary | ICD-10-CM | POA: Diagnosis not present

## 2023-09-06 DIAGNOSIS — F17210 Nicotine dependence, cigarettes, uncomplicated: Secondary | ICD-10-CM | POA: Diagnosis present

## 2023-09-06 DIAGNOSIS — R7303 Prediabetes: Secondary | ICD-10-CM | POA: Diagnosis not present

## 2023-09-06 DIAGNOSIS — I5021 Acute systolic (congestive) heart failure: Secondary | ICD-10-CM | POA: Diagnosis not present

## 2023-09-06 DIAGNOSIS — R579 Shock, unspecified: Secondary | ICD-10-CM | POA: Diagnosis not present

## 2023-09-06 DIAGNOSIS — Z7189 Other specified counseling: Secondary | ICD-10-CM | POA: Diagnosis not present

## 2023-09-06 DIAGNOSIS — I700 Atherosclerosis of aorta: Secondary | ICD-10-CM | POA: Diagnosis not present

## 2023-09-06 DIAGNOSIS — Z4682 Encounter for fitting and adjustment of non-vascular catheter: Secondary | ICD-10-CM | POA: Diagnosis not present

## 2023-09-06 DIAGNOSIS — R64 Cachexia: Secondary | ICD-10-CM | POA: Diagnosis present

## 2023-09-06 DIAGNOSIS — Z91199 Patient's noncompliance with other medical treatment and regimen due to unspecified reason: Secondary | ICD-10-CM

## 2023-09-06 DIAGNOSIS — R456 Violent behavior: Secondary | ICD-10-CM | POA: Diagnosis not present

## 2023-09-06 DIAGNOSIS — R0602 Shortness of breath: Secondary | ICD-10-CM | POA: Diagnosis not present

## 2023-09-06 DIAGNOSIS — Z90722 Acquired absence of ovaries, bilateral: Secondary | ICD-10-CM

## 2023-09-06 DIAGNOSIS — Z833 Family history of diabetes mellitus: Secondary | ICD-10-CM

## 2023-09-06 DIAGNOSIS — E210 Primary hyperparathyroidism: Secondary | ICD-10-CM | POA: Diagnosis present

## 2023-09-06 DIAGNOSIS — Z9911 Dependence on respirator [ventilator] status: Secondary | ICD-10-CM

## 2023-09-06 DIAGNOSIS — R0603 Acute respiratory distress: Secondary | ICD-10-CM | POA: Diagnosis not present

## 2023-09-06 DIAGNOSIS — Z681 Body mass index (BMI) 19 or less, adult: Secondary | ICD-10-CM

## 2023-09-06 DIAGNOSIS — J90 Pleural effusion, not elsewhere classified: Secondary | ICD-10-CM | POA: Diagnosis not present

## 2023-09-06 DIAGNOSIS — J189 Pneumonia, unspecified organism: Secondary | ICD-10-CM | POA: Diagnosis not present

## 2023-09-06 DIAGNOSIS — J9601 Acute respiratory failure with hypoxia: Secondary | ICD-10-CM | POA: Diagnosis not present

## 2023-09-06 DIAGNOSIS — R404 Transient alteration of awareness: Secondary | ICD-10-CM | POA: Diagnosis not present

## 2023-09-06 LAB — PROTIME-INR
INR: 1 (ref 0.8–1.2)
Prothrombin Time: 12.9 s (ref 11.4–15.2)

## 2023-09-06 LAB — COMPREHENSIVE METABOLIC PANEL WITH GFR
ALT: 17 U/L (ref 0–44)
AST: 25 U/L (ref 15–41)
Albumin: 3.3 g/dL — ABNORMAL LOW (ref 3.5–5.0)
Alkaline Phosphatase: 113 U/L (ref 38–126)
Anion gap: 11 (ref 5–15)
BUN: 17 mg/dL (ref 8–23)
CO2: 28 mmol/L (ref 22–32)
Calcium: 9.2 mg/dL (ref 8.9–10.3)
Chloride: 100 mmol/L (ref 98–111)
Creatinine, Ser: 1.13 mg/dL — ABNORMAL HIGH (ref 0.44–1.00)
GFR, Estimated: 50 mL/min — ABNORMAL LOW (ref >60)
Glucose, Bld: 203 mg/dL — ABNORMAL HIGH (ref 70–99)
Potassium: 4.3 mmol/L (ref 3.5–5.1)
Sodium: 139 mmol/L (ref 135–145)
Total Bilirubin: 0.6 mg/dL (ref 0.0–1.2)
Total Protein: 7 g/dL (ref 6.5–8.1)

## 2023-09-06 LAB — CBC WITH DIFFERENTIAL/PLATELET
Abs Immature Granulocytes: 0.05 10*3/uL (ref 0.00–0.07)
Basophils Absolute: 0.1 10*3/uL (ref 0.0–0.1)
Basophils Relative: 1 %
Eosinophils Absolute: 0 10*3/uL (ref 0.0–0.5)
Eosinophils Relative: 0 %
HCT: 46.1 % — ABNORMAL HIGH (ref 36.0–46.0)
Hemoglobin: 14.4 g/dL (ref 12.0–15.0)
Immature Granulocytes: 0 %
Lymphocytes Relative: 18 %
Lymphs Abs: 2.3 10*3/uL (ref 0.7–4.0)
MCH: 30.4 pg (ref 26.0–34.0)
MCHC: 31.2 g/dL (ref 30.0–36.0)
MCV: 97.3 fL (ref 80.0–100.0)
Monocytes Absolute: 0.7 10*3/uL (ref 0.1–1.0)
Monocytes Relative: 6 %
Neutro Abs: 9.9 10*3/uL — ABNORMAL HIGH (ref 1.7–7.7)
Neutrophils Relative %: 75 %
Platelets: 412 10*3/uL — ABNORMAL HIGH (ref 150–400)
RBC: 4.74 MIL/uL (ref 3.87–5.11)
RDW: 14.7 % (ref 11.5–15.5)
WBC: 13.1 10*3/uL — ABNORMAL HIGH (ref 4.0–10.5)
nRBC: 0 % (ref 0.0–0.2)

## 2023-09-06 LAB — BRAIN NATRIURETIC PEPTIDE: B Natriuretic Peptide: >4500 pg/mL — ABNORMAL HIGH (ref 0.0–100.0)

## 2023-09-06 LAB — LACTIC ACID, PLASMA: Lactic Acid, Venous: 2.7 mmol/L (ref 0.5–1.9)

## 2023-09-06 MED ORDER — SODIUM CHLORIDE 0.9 % IV SOLN
500.0000 mg | Freq: Once | INTRAVENOUS | Status: AC
Start: 2023-09-06 — End: 2023-09-07
  Administered 2023-09-06: 500 mg via INTRAVENOUS
  Filled 2023-09-06: qty 5

## 2023-09-06 MED ORDER — IPRATROPIUM-ALBUTEROL 0.5-2.5 (3) MG/3ML IN SOLN
3.0000 mL | Freq: Once | RESPIRATORY_TRACT | Status: AC
  Administered 2023-09-06: 3 mL via RESPIRATORY_TRACT
  Filled 2023-09-06: qty 3

## 2023-09-06 MED ORDER — CEFTRIAXONE SODIUM 2 G IJ SOLR
2.0000 g | Freq: Once | INTRAMUSCULAR | Status: AC
  Administered 2023-09-06: 2 g via INTRAVENOUS
  Filled 2023-09-06: qty 20

## 2023-09-06 MED ORDER — ALBUTEROL SULFATE (2.5 MG/3ML) 0.083% IN NEBU
2.5000 mg | INHALATION_SOLUTION | Freq: Once | RESPIRATORY_TRACT | Status: AC
  Administered 2023-09-06: 2.5 mg via RESPIRATORY_TRACT
  Filled 2023-09-06: qty 3

## 2023-09-06 MED ORDER — METHYLPREDNISOLONE SODIUM SUCC 125 MG IJ SOLR
125.0000 mg | Freq: Once | INTRAMUSCULAR | Status: AC
  Administered 2023-09-06: 125 mg via INTRAVENOUS
  Filled 2023-09-06: qty 2

## 2023-09-06 MED ORDER — MAGNESIUM SULFATE 2 GM/50ML IV SOLN
2.0000 g | Freq: Once | INTRAVENOUS | Status: AC
  Administered 2023-09-06: 2 g via INTRAVENOUS
  Filled 2023-09-06: qty 50

## 2023-09-06 MED ORDER — SODIUM CHLORIDE 0.9 % IV BOLUS: 1000.0000 mL | Freq: Once | INTRAVENOUS | Status: DC

## 2023-09-06 NOTE — Progress Notes (Signed)
 Pt being followed by [facility] for Sepsis protocol.

## 2023-09-06 NOTE — ED Triage Notes (Addendum)
 Pt bib [facility] from home, family called out for breathing difficulty. Per EMS pt was 70% on RA upon their arrival, pt arrives to ED at 96% on 15L non-rebreather.  Pt has dementia and is non compliant with all medication. Hx of COPD. EMS also reports family stated pt has not been eating well for the last week.   EDP and Respiratory at bedside

## 2023-09-06 NOTE — ED Provider Notes (Signed)
 [place] EMERGENCY DEPARTMENT AT [facility] Provider Note   CSN: [id number] Arrival date & time: 09/06/23  2222     Patient presents with: Respiratory Distress   Carla [name] is a 78 y.o. female.  {Add pertinent medical, surgical, social history, OB history to BMW:41324} Patient has a history of severe COPD and dementia.  She became very short of breath.  Paramedics arrived and her O2 sats were in the 70s.  The history is provided by the patient and medical records. No language interpreter was used.  Shortness of Breath Severity:  Severe Onset quality:  Sudden Timing:  Constant Progression:  Worsening Chronicity:  Recurrent Context: activity   Relieved by:  Nothing Worsened by:  Nothing Ineffective treatments:  Oxygen  Associated symptoms: no abdominal pain        Prior to Admission medications   Medication Sig Start Date End Date Taking? Authorizing Provider  acetaminophen  (TYLENOL ) 325 MG tablet Take 2 tablets (650 mg total) by mouth every 6 (six) hours as needed for mild pain (pain score 1-3) (or Fever >/= 101). 06/11/23   Shahmehdi, Constantino Demark, MD  ALPRAZolam  (XANAX ) 0.25 MG tablet Take 1 tablet (0.25 mg total) by mouth 2 (two) times daily. 06/13/23   Jodeane Mulligan, DO  amLODipine  (NORVASC ) 5 MG tablet Take 5 mg by mouth daily.    [provider]  arformoterol  (BROVANA ) 15 MCG/2ML NEBU Take 2 mLs (15 mcg total) by nebulization 2 (two) times daily. 06/11/23   Shahmehdi, Seyed A, MD  ASHWAGANDHA PO Take 1 capsule by mouth at bedtime.    [provider]  azithromycin  (ZITHROMAX ) 250 MG tablet Take 1 tablet (250 mg total) by mouth daily. 06/14/23   Orvilla Blander, MD  levalbuterol  (XOPENEX ) 0.63 MG/3ML nebulizer solution Take 3 mLs (0.63 mg total) by nebulization 2 (two) times daily. 06/11/23   Shahmehdi, Constantino Demark, MD  metoprolol  succinate (TOPROL -XL) 25 MG 24 hr tablet Take 1 tablet (25 mg total) by mouth daily. 06/12/23   Shahmehdi, Constantino Demark, MD   mirtazapine  (REMERON  SOL-TAB) 30 MG disintegrating tablet Take 1 tablet (30 mg total) by mouth at bedtime. 06/13/23   Jodeane Mulligan, DO  Nebulizers (COMPRESSOR/NEBULIZER) MISC 1 each by Does not apply route as directed. 06/13/23   Jodeane Mulligan, DO  nicotine  (NICODERM CQ  - DOSED IN MG/24 HOURS) 14 mg/24hr patch Place 1 patch (14 mg total) onto the skin daily as needed (nicotine  craving). 06/11/23   Shahmehdi, Constantino Demark, MD  QUEtiapine  (SEROQUEL ) 100 MG tablet Take 1 tablet (100 mg total) by mouth at bedtime. 06/13/23   Jodeane Mulligan, DO  revefenacin  (YUPELRI ) 175 MCG/3ML nebulizer solution Take 3 mLs (175 mcg total) by nebulization daily. 06/12/23   Shahmehdi, Constantino Demark, MD  senna-docusate (SENOKOT-S) 8.6-50 MG tablet Take 1 tablet by mouth at bedtime. 06/11/23   Shahmehdi, Constantino Demark, MD  tamsulosin  (FLOMAX ) 0.4 MG CAPS capsule Take 1 capsule (0.4 mg total) by mouth daily. 06/13/23   Jodeane Mulligan, DO    Allergies: Aricept  [donepezil  hcl], Calcium -containing compounds, Exelon  [rivastigmine  tartrate], and Exelon  [rivastigmine ]    Review of Systems  Unable to perform ROS: Acuity of condition  Respiratory:  Positive for shortness of breath.   Gastrointestinal:  Negative for abdominal pain.    Updated Vital Signs BP (!) 160/75   Pulse (!) 146   Temp (!) 97.2 F (36.2 C) (Axillary)   Resp (!) 35   SpO2 98%   Physical Exam Vitals  and nursing note reviewed.  Constitutional:      Appearance: She is well-developed.  HENT:     Head: Normocephalic.     Nose: Nose normal.   Eyes:     General: No scleral icterus.    Conjunctiva/sclera: Conjunctivae normal.   Neck:     Thyroid : No thyromegaly.   Cardiovascular:     Rate and Rhythm: Regular rhythm. Tachycardia present.     Heart sounds: No murmur heard.    No friction rub. No gallop.  Pulmonary:     Breath sounds: No stridor. Wheezing present. No rales.     Comments: Tachypnea Chest:     Chest wall: No tenderness.  Abdominal:      General: There is no distension.     Tenderness: There is no abdominal tenderness. There is no rebound.   Musculoskeletal:        General: Normal range of motion.     Cervical back: Neck supple.  Lymphadenopathy:     Cervical: No cervical adenopathy.   Skin:    Findings: No erythema or rash.   Neurological:     Mental Status: She is alert.     Motor: No abnormal muscle tone.     Coordination: Coordination normal.     Comments: Oriented to person only  Psychiatric:     Comments: Anxious     (all labs ordered are listed, but only abnormal results are displayed) Labs Reviewed  LACTIC ACID, PLASMA - Abnormal; Notable for the following components:      Result Value   Lactic Acid, Venous 2.7 (*)    All other components within normal limits  COMPREHENSIVE METABOLIC PANEL WITH GFR - Abnormal; Notable for the following components:   Glucose, Bld 203 (*)    Creatinine, Ser 1.13 (*)    Albumin 3.3 (*)    GFR, Estimated 50 (*)    All other components within normal limits  CBC WITH DIFFERENTIAL/PLATELET - Abnormal; Notable for the following components:   WBC 13.1 (*)    HCT 46.1 (*)    Platelets 412 (*)    Neutro Abs 9.9 (*)    All other components within normal limits  BRAIN NATRIURETIC PEPTIDE - Abnormal; Notable for the following components:   B Natriuretic Peptide >4,500.0 (*)    All other components within normal limits  BLOOD GAS, VENOUS - Abnormal; Notable for the following components:   pCO2, Ven 73 (*)    pO2, Ven <31 (*)    Bicarbonate 33.3 (*)    Acid-Base Excess 3.6 (*)    All other components within normal limits  CULTURE, BLOOD (ROUTINE X 2)  RESP PANEL BY RT-PCR (RSV, FLU A&B, COVID)  RVPGX2  CULTURE, BLOOD (ROUTINE X 2)  PROTIME-INR  LACTIC ACID, PLASMA  URINALYSIS, W/ REFLEX TO CULTURE (INFECTION SUSPECTED)  TROPONIN I (HIGH SENSITIVITY)    EKG: EKG Interpretation Date/Time:  Saturday September 06 2023 22:26:34 EDT Ventricular Rate:  115 PR  Interval:  140 QRS Duration:  73 QT Interval:  339 QTC Calculation: 469 R Axis:   132  Text Interpretation: Sinus tachycardia Right atrial enlargement Right ventricular hypertrophy Borderline abnrm T, anterolateral leads Confirmed by Cheyenne Cotta [phone]) on 09/06/2023 11:13:09 PM  Radiology: [facility] Query Chest Port 1 View Result Date: 09/06/2023 CLINICAL DATA:  Questionable sepsis-evaluate for abnormality. Difficulty breathing. Dementia. Decreased oral intake. EXAM: PORTABLE CHEST 1 VIEW COMPARISON:  06/14/2023 FINDINGS: Stable cardiomediastinal silhouette. Aortic atherosclerotic calcification. Diffuse interstitial coarsening has increased compared to 06/14/2023.  Moderate left pleural effusion and left basilar airspace opacities. Small right pleural effusion. No pneumothorax. No displaced rib fractures. IMPRESSION: 1. Left lower lobe pneumonia. Small right and moderate left pleural effusions. Electronically Signed   By: Rozell Cornet M.D.   On: 09/06/2023 22:45    {Document cardiac monitor, telemetry assessment procedure when appropriate:32947} Procedures   Medications Ordered in the ED  cefTRIAXone  (ROCEPHIN ) 2 g in sodium chloride  0.9 % 100 mL IVPB (2 g Intravenous New Bag/Given 09/06/23 2322)  azithromycin  (ZITHROMAX ) 500 mg in sodium chloride  0.9 % 250 mL IVPB (500 mg Intravenous New Bag/Given 09/06/23 2328)  magnesium  sulfate IVPB 2 g 50 mL (2 g Intravenous New Bag/Given 09/06/23 2249)  sodium chloride  0.9 % bolus 1,000 mL (0 mLs Intravenous Hold 09/06/23 2322)  methylPREDNISolone  sodium succinate (SOLU-MEDROL ) 125 mg/2 mL injection 125 mg (125 mg Intravenous Given 09/06/23 2257)  ipratropium-albuterol  (DUONEB) 0.5-2.5 (3) MG/3ML nebulizer solution 3 mL (3 mLs Nebulization Given 09/06/23 2251)  albuterol  (PROVENTIL ) (2.5 MG/3ML) 0.083% nebulizer solution 2.5 mg (2.5 mg Nebulization Given 09/06/23 2251)   .edcxr CRITICAL CARE Performed by: Cheyenne Cotta Total critical care time: 45  minutes Critical care time was exclusive of separately billable procedures and treating other patients. Critical care was necessary to treat or prevent imminent or life-threatening deterioration. Critical care was time spent personally by me on the following activities: development of treatment plan with patient and/or surrogate as well as nursing, discussions with consultants, evaluation of patient's response to treatment, examination of patient, obtaining history from patient or surrogate, ordering and performing treatments and interventions, ordering and review of laboratory studies, ordering and review of radiographic studies, pulse oximetry and re-evaluation of patient's condition.    {Click here for ABCD2, HEART and other calculators REFRESH Note before signing:1}                              Medical Decision Making Amount and/or Complexity of Data Reviewed Labs: ordered. Radiology: ordered.  Risk Prescription drug management.   Patient with COPD exacerbation and pneumonia {Document critical care time when appropriate  Document review of labs and clinical decision tools ie CHADS2VASC2, etc  Document your independent review of radiology images and any outside records  Document your discussion with family members, caretakers and with consultants  Document social determinants of health affecting pt's care  Document your decision making why or why not admission, treatments were needed:32947:::1}   Final diagnoses:  None    ED Discharge Orders     None

## 2023-09-06 NOTE — H&P (Signed)
 History and Physical    Patient: Carla [name] WUJ:[id number] DOB: [birth year] DOA: 09/06/2023 DOS: the patient was seen and examined on 09/07/2023 PCP: Patient, [name]  Patient coming from: Home  Chief Complaint:  Chief Complaint  Patient presents with   Respiratory Distress   HPI: Carla [name] is a 78 y.o. female with medical history significant of hypertension, hyperlipidemia, COPD, primary hyperparathyroidism, anxiety and tobacco abuse who presents to the emergency department from home via EMS due to shortness of breath.  On arrival of EMS team, she was noted to have an O2 sat in the 70s.  She was placed on NRB at 15 L with improvement in O2 sat to 96%.  ED Course:  In the emergency department, temperature was 90 7.66F, respiratory rate 35/min, pulse 146 bpm, BP 160/75, O2 sat was 98% on HFNC at 6 L.  VBG was done and showed pH 7.26, PCO273, PO2 < 31, bicarb 33.3.  Troponin 308 > 246.  Lactic acid 2.7 >3.2.  BNP > 4,500.  Urinalysis was normal.  CBC was normal except for WBC of 13.1 and hematocrit of 46.1, platelets 412.  BMP was normal except for blood glucose of 203 Chest x-ray showed left lower lobe pneumonia.  Small right and moderate left pleural effusions Breathing treatment with DuoNeb and albuterol  was provided, IV Solu-Medrol  25 mg x 1 was given.  She was started on IV ceftriaxone  and azithromycin . With time in the ED, she was placed on BiPAP, but patient continued to be agitated, altered and pulled off BiPAP.  It was then decided for patient to be intubated.  She was started on IV Levophed , propofol  and fentanyl . PCCM (Dr. Claven Cumming) was consulted and accepted patient for transfer to ICU for further evaluation and management.  Review of Systems: Review of systems as noted in the HPI. All other systems reviewed and are negative.   Past Medical History:  Diagnosis Date   Anxiety    ASCVD (arteriosclerotic cardiovascular disease)    No critical disease on cath in  8/97: mild AI with trival, if any l AS; negative stress nuclear in 2010   Atrophic vaginitis 04/18/2016   Back pain    Cervical disc herniation    Chronic bronchitis    Colon polyps    De Quervain's disease (radial styloid tenosynovitis) 10/14/2016   Dementia (HCC) 06/10/2020   Depression    Elevated hemoglobin A1c    Fasting hyperglycemia    GERD (gastroesophageal reflux disease)    Heart disease    HSV (herpes simplex virus) infection    Hydronephrosis    Hypercalcemia 12/30/2008   Qualifier: Diagnosis of  By: Gardenia Jump LPN, Brandi     Hypercholesteremia    Hypertension    Neck muscle spasm 04/23/2014   Onychomycosis 07/08/2015   Osteoporosis    Peripheral vascular disease (HCC)    stauts post aortabifemoral graft     Secondary erythrocytosis 08/05/2012   Secondary to COPD   Tobacco abuse    has stopped   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 Fields:Normal colon without evidence of polyps, masses, inflammatory changes/Normal retroflex view of the rectum   COLONOSCOPY N/A 08/24/2013   Procedure: COLONOSCOPY;  Surgeon: Alyce Jubilee, MD;  Location: [facility];  Service: Endoscopy;  Laterality: N/A;  10:30-moved to [address] notified pt   lysis of adhesions  2000   PARTIAL HYSTERECTOMY     right kidney surgery following damage during arterial surgery     TOTAL ABDOMINAL HYSTERECTOMY W/ BILATERAL SALPINGOOPHORECTOMY  1975    Social History:  reports that she has been smoking cigarettes. She started smoking about 62 years ago. She has a 25 pack-year smoking history. She has never used smokeless tobacco. She reports that she does not drink alcohol and does not use drugs.   Allergies  Allergen Reactions   Aricept  [Donepezil  Hcl] Diarrhea   Calcium -Containing Compounds Other (See Comments)    Pt has hyperparathyroidism which  results in hypercalcemia   Exelon  [Rivastigmine  Tartrate] Rash   Exelon  [Rivastigmine ] Rash    Family History  Problem Relation Age of Onset   Heart attack Father    COPD Sister    Heart disease Sister    Diabetes Sister    Coronary artery disease Sister    Liver cancer Sister 27   Diabetes Sister    Diabetes Sister    COPD Sister    Heart attack Mother    COPD Brother    Heart attack Son    Colon cancer Neg Hx      Prior to Admission medications   Medication Sig Start Date End Date Taking? Authorizing Provider  acetaminophen  (TYLENOL ) 325 MG tablet Take 2 tablets (650 mg total) by mouth every 6 (six) hours as needed for mild pain (pain score 1-3) (or Fever >/= 101). 06/11/23   Shahmehdi, Constantino Demark, MD  ALPRAZolam  (XANAX ) 0.25 MG tablet Take 1 tablet (0.25 mg total) by mouth 2 (two) times daily. 06/13/23   Jodeane Mulligan, DO  amLODipine  (NORVASC ) 5 MG tablet Take 5 mg by mouth daily.    [provider]  arformoterol  (BROVANA ) 15 MCG/2ML NEBU Take 2 mLs (15 mcg total) by nebulization 2 (two) times daily. 06/11/23   Shahmehdi, Seyed A, MD  ASHWAGANDHA PO Take 1 capsule by mouth at bedtime.    [provider]  azithromycin  (ZITHROMAX ) 250 MG tablet Take 1 tablet (250 mg total) by mouth daily. 06/14/23   Orvilla Blander, MD  levalbuterol  (XOPENEX ) 0.63 MG/3ML nebulizer solution Take 3 mLs (0.63 mg total) by nebulization 2 (two) times daily. 06/11/23   Shahmehdi, Constantino Demark, MD  metoprolol  succinate (TOPROL -XL) 25 MG 24 hr tablet Take 1 tablet (25 mg total) by mouth daily. 06/12/23   Shahmehdi, Constantino Demark, MD  mirtazapine  (REMERON  SOL-TAB) 30 MG disintegrating tablet Take 1 tablet (30 mg total) by mouth at bedtime. 06/13/23   Jodeane Mulligan, DO  Nebulizers (COMPRESSOR/NEBULIZER) MISC 1 each by Does not apply route as directed. 06/13/23   Jodeane Mulligan, DO  nicotine  (NICODERM CQ  - DOSED IN MG/24 HOURS) 14 mg/24hr patch Place 1 patch (14 mg total) onto the skin daily as needed  (nicotine  craving). 06/11/23   Shahmehdi, Constantino Demark, MD  QUEtiapine  (SEROQUEL ) 100 MG tablet Take 1 tablet (100 mg total) by mouth at bedtime. 06/13/23   Jodeane Mulligan, DO  revefenacin  (YUPELRI ) 175 MCG/3ML nebulizer solution Take 3 mLs (175 mcg total) by nebulization daily. 06/12/23   Shahmehdi, Constantino Demark, MD  senna-docusate (SENOKOT-S) 8.6-50 MG tablet Take 1 tablet by mouth at bedtime. 06/11/23   Shahmehdi, Constantino Demark, MD  tamsulosin  (FLOMAX ) 0.4 MG CAPS capsule Take 1 capsule (0.4 mg total) by mouth daily. 06/13/23   Jodeane Mulligan, DO    Physical Exam: BP Aaron Aas)  99/37 (BP Location: Right Arm)   Pulse 70   Temp 98.1 F (36.7 C) (Bladder)   Resp (!) 22   Ht 5' (1.524 m)   Wt 41.4 kg   SpO2 100%   BMI 17.83 kg/m   General: 78 y.o. year-old female ill appearing, intubated and sedated and in no acute distress.   HEENT: NCAT, PERRL Neck: Supple, trachea medial Cardiovascular: Tachycardia.  Regular rate and rhythm with no rubs or gallops.  No thyromegaly or JVD noted.  No lower extremity edema. 2/4 pulses in all 4 extremities. Respiratory: Tachypnea.  Diffuse wheezes, bilateral mild rales in lower lobes ( L > R ).   Abdomen: Soft, nontender nondistended with normal bowel sounds x4 quadrants. Muskuloskeletal: No cyanosis, clubbing or edema noted bilaterally Neuro: This cannot be evaluated at this time due to patient being intubated and sedated  Skin: No ulcerative lesions noted or rashes Psychiatry: This cannot be evaluated at this time due to patient being intubated and sedated           Labs on Admission:  Basic Metabolic Panel: Recent Labs  Lab 09/06/23 2226  NA 139  K 4.3  CL 100  CO2 28  GLUCOSE 203*  BUN 17  CREATININE 1.13*  CALCIUM  9.2   Liver Function Tests: Recent Labs  Lab 09/06/23 2226  AST 25  ALT 17  ALKPHOS 113  BILITOT 0.6  PROT 7.0  ALBUMIN 3.3*   No results for input(s): LIPASE, AMYLASE in the last 168 hours. No results for input(s): AMMONIA in the  last 168 hours. CBC: Recent Labs  Lab 09/06/23 2226  WBC 13.1*  NEUTROABS 9.9*  HGB 14.4  HCT 46.1*  MCV 97.3  PLT 412*   Cardiac Enzymes: No results for input(s): CKTOTAL, CKMB, CKMBINDEX, TROPONINI in the last 168 hours.  BNP (last 3 results) Recent Labs    06/06/23 0442 06/07/23 0500 09/06/23 2228  BNP 483.0* 475.0* >4,500.0*    ProBNP (last 3 results) No results for input(s): PROBNP in the last 8760 hours.  CBG: Recent Labs  Lab 09/07/23 0358  GLUCAP 223*    Radiological Exams on Admission: DG Chest Portable 1 View Result Date: 09/07/2023 CLINICAL DATA:  Status post intubation. EXAM: PORTABLE CHEST 1 VIEW COMPARISON:  September 06, 2023 FINDINGS: Since the prior study there is been interval placement of an endotracheal tube. Its distal tip is approximately 4.6 cm from the carina. Interval enteric tube position is also seen with its distal portion extending below the level of the diaphragm. The heart size and mediastinal contours are within normal limits. There is marked severity calcification of the aortic arch. The lungs are hyperinflated with stable diffuse chronic appearing increased interstitial lung markings. Moderate severity left basilar atelectasis and/or infiltrate is seen with a small left pleural effusion. No pneumothorax is identified. Multilevel degenerative changes seen throughout the thoracic spine. IMPRESSION: 1. Interval endotracheal tube and enteric tube placement, as described above. 2. Moderate severity left basilar atelectasis and/or infiltrate with a small left pleural effusion. Electronically Signed   By: Virgle Grime M.D.   On: 09/07/2023 01:48   DG Chest Port 1 View Result Date: 09/06/2023 CLINICAL DATA:  Questionable sepsis-evaluate for abnormality. Difficulty breathing. Dementia. Decreased oral intake. EXAM: PORTABLE CHEST 1 VIEW COMPARISON:  06/14/2023 FINDINGS: Stable cardiomediastinal silhouette. Aortic atherosclerotic calcification.  Diffuse interstitial coarsening has increased compared to 06/14/2023. Moderate left pleural effusion and left basilar airspace opacities. Small right pleural effusion. No pneumothorax. No displaced rib fractures.  IMPRESSION: 1. Left lower lobe pneumonia. Small right and moderate left pleural effusions. Electronically Signed   By: Rozell Cornet M.D.   On: 09/06/2023 22:45    EKG: I independently viewed the EKG done and my findings are as followed: Sinus tachycardia at a rate of 115 bpm  Assessment/Plan Present on Admission:  CAP (community acquired pneumonia)  Acute on chronic respiratory failure with hypoxia and hypercapnia (HCC)  COPD with acute exacerbation (HCC)  Prediabetes  Principal Problem:   CAP (community acquired pneumonia) Active Problems:   Prediabetes   COPD with acute exacerbation (HCC)   Acute on chronic respiratory failure with hypoxia and hypercapnia (HCC)   Severe sepsis (HCC)   Lactic acidosis   Elevated brain natriuretic peptide (BNP) level   Bilateral pleural effusion   Elevated troponin   Hyperglycemia   Thrombocytosis  Acute on chronic respiratory failure with hypoxia and hypercapnia Patient was intubated and sedated ABG postintubation showed pH 7.35, PCO245, PO290, O2 sat 97.7 PCCM was consulted and patient was transferred to [facility]  Severe sepsis due to CAP POA Patient met sepsis criteria due to having leukocytosis, tachypnea and tachycardia Chest x-ray was suggestive of Pneumonia Lactic acid 2.7 > 3.2 She was started on IV ceftriaxone  and azithromycin  Blood culture pending  Lactic acidosis Lactic acid 2.7 > 3.2; continue to trend lactic acid  Elevated BNP; r/o CHF Bilateral pleural effusion ( L > R ) BNP > 4,500 Continue total input/output, daily weights and fluid restriction IV Lasix  40 mg x 1 was given Echocardiogram in the morning   Elevated troponin possibly secondary to type II demand ischemia Troponin 308 > 246  Pre-diabetes  with hyperglycemia Hemoglobin A1c on 06/02/2023 was 6.0 CBG of 203 Continue ISS and hypoglycemia protocol.  Thrombocytosis possibly reactive Platelets 412, continue to monitor platelet levels  DVT prophylaxis: Subcu heparin   Code Status: Full code  Family Communication: Family at bedside (all questions answered to satisfaction)  Consults: PCCM (Dr. Claven Cumming)  Severity of Illness: The appropriate patient status for this patient is INPATIENT. Inpatient status is judged to be reasonable and necessary in order to provide the required intensity of service to ensure the patient's safety. The patient's presenting symptoms, physical exam findings, and initial radiographic and laboratory data in the context of their chronic comorbidities is felt to place them at high risk for further clinical deterioration. Furthermore, it is not anticipated that the patient will be medically stable for discharge from the hospital within 2 midnights of admission.   * I certify that at the point of admission it is my clinical judgment that the patient will require inpatient hospital care spanning beyond 2 midnights from the point of admission due to high intensity of service, high risk for further deterioration and high frequency of surveillance required.*  Author: Nakkia Mackiewicz, DO 09/07/2023 5:04 AM   Critical time: 71 minutes   Critical care personally provided  managing the patient due to high probability of clinically significant and life threatening deterioration. This critical care time included obtaining a history; examining the patient, pulse oximetry; ordering and review of studies; arranging urgent treatment with development of a management plan; evaluation of patient's response of treatment; frequent reassessment; and discussions with other providers.  This critical care time was performed to assess and manage the high probability of imminent and life threatening deterioration that could result in  multi-organ failure.    For on call review [email or web address].

## 2023-09-07 ENCOUNTER — Inpatient Hospital Stay (HOSPITAL_COMMUNITY)

## 2023-09-07 DIAGNOSIS — R739 Hyperglycemia, unspecified: Secondary | ICD-10-CM | POA: Diagnosis present

## 2023-09-07 DIAGNOSIS — R0603 Acute respiratory distress: Secondary | ICD-10-CM

## 2023-09-07 DIAGNOSIS — R7989 Other specified abnormal findings of blood chemistry: Secondary | ICD-10-CM

## 2023-09-07 DIAGNOSIS — Z7189 Other specified counseling: Secondary | ICD-10-CM

## 2023-09-07 DIAGNOSIS — E8729 Other acidosis: Secondary | ICD-10-CM

## 2023-09-07 DIAGNOSIS — J9601 Acute respiratory failure with hypoxia: Secondary | ICD-10-CM

## 2023-09-07 DIAGNOSIS — Z515 Encounter for palliative care: Secondary | ICD-10-CM

## 2023-09-07 DIAGNOSIS — D75839 Thrombocytosis, unspecified: Secondary | ICD-10-CM | POA: Insufficient documentation

## 2023-09-07 DIAGNOSIS — J9622 Acute and chronic respiratory failure with hypercapnia: Secondary | ICD-10-CM | POA: Diagnosis present

## 2023-09-07 DIAGNOSIS — J189 Pneumonia, unspecified organism: Secondary | ICD-10-CM | POA: Diagnosis not present

## 2023-09-07 DIAGNOSIS — A419 Sepsis, unspecified organism: Secondary | ICD-10-CM | POA: Diagnosis not present

## 2023-09-07 DIAGNOSIS — J441 Chronic obstructive pulmonary disease with (acute) exacerbation: Secondary | ICD-10-CM | POA: Diagnosis not present

## 2023-09-07 LAB — RESPIRATORY PANEL BY PCR

## 2023-09-07 LAB — URINALYSIS, W/ REFLEX TO CULTURE (INFECTION SUSPECTED)
Bacteria, UA: NONE SEEN
Bilirubin Urine: NEGATIVE
Glucose, UA: NEGATIVE mg/dL
Ketones, ur: NEGATIVE mg/dL
Leukocytes,Ua: NEGATIVE
Nitrite: NEGATIVE
Protein, ur: 100 mg/dL — AB
Specific Gravity, Urine: 1.019 (ref 1.005–1.030)
pH: 5 (ref 5.0–8.0)

## 2023-09-07 LAB — HEMOGLOBIN A1C
Hgb A1c MFr Bld: 5.9 % — ABNORMAL HIGH (ref 4.8–5.6)
Mean Plasma Glucose: 122.63 mg/dL

## 2023-09-07 LAB — CBC
HCT: 45.8 % (ref 36.0–46.0)
Hemoglobin: 14.1 g/dL (ref 12.0–15.0)
MCH: 29.3 pg (ref 26.0–34.0)
MCHC: 30.8 g/dL (ref 30.0–36.0)
MCV: 95.2 fL (ref 80.0–100.0)
Platelets: 323 10*3/uL (ref 150–400)
RBC: 4.81 MIL/uL (ref 3.87–5.11)
RDW: 14.5 % (ref 11.5–15.5)
WBC: 15.1 10*3/uL — ABNORMAL HIGH (ref 4.0–10.5)
nRBC: 0 % (ref 0.0–0.2)

## 2023-09-07 LAB — BLOOD GAS, ARTERIAL
Acid-base deficit: 0.8 mmol/L (ref 0.0–2.0)
Bicarbonate: 25.4 mmol/L (ref 20.0–28.0)
Drawn by: 22223
O2 Saturation: 97.7 %
Patient temperature: 36.2
pCO2 arterial: 45 mmHg (ref 32–48)
pH, Arterial: 7.35 (ref 7.35–7.45)
pO2, Arterial: 90 mmHg (ref 83–108)

## 2023-09-07 LAB — PHOSPHORUS
Phosphorus: 4.5 mg/dL (ref 2.5–4.6)
Phosphorus: 3.1 mg/dL (ref 2.5–4.6)
Phosphorus: 3.3 mg/dL (ref 2.5–4.6)

## 2023-09-07 LAB — ECHOCARDIOGRAM COMPLETE
Area-P 1/2: 3.89 cm2
Height: 60 in
P 1/2 time: 667 ms
S' Lateral: 3.2 cm
Single Plane A4C EF: 36.2 %
Weight: 1460.33 [oz_av]

## 2023-09-07 LAB — GLUCOSE, CAPILLARY
Glucose-Capillary: 142 mg/dL — ABNORMAL HIGH (ref 70–99)
Glucose-Capillary: 158 mg/dL — ABNORMAL HIGH (ref 70–99)
Glucose-Capillary: 175 mg/dL — ABNORMAL HIGH (ref 70–99)
Glucose-Capillary: 183 mg/dL — ABNORMAL HIGH (ref 70–99)
Glucose-Capillary: 200 mg/dL — ABNORMAL HIGH (ref 70–99)
Glucose-Capillary: 223 mg/dL — ABNORMAL HIGH (ref 70–99)

## 2023-09-07 LAB — COMPREHENSIVE METABOLIC PANEL WITH GFR
ALT: 27 U/L (ref 0–44)
AST: 61 U/L — ABNORMAL HIGH (ref 15–41)
Albumin: 2.9 g/dL — ABNORMAL LOW (ref 3.5–5.0)
Alkaline Phosphatase: 161 U/L — ABNORMAL HIGH (ref 38–126)
Anion gap: 14 (ref 5–15)
BUN: 18 mg/dL (ref 8–23)
CO2: 25 mmol/L (ref 22–32)
Calcium: 9.1 mg/dL (ref 8.9–10.3)
Chloride: 103 mmol/L (ref 98–111)
Creatinine, Ser: 1.36 mg/dL — ABNORMAL HIGH (ref 0.44–1.00)
GFR, Estimated: 40 mL/min — ABNORMAL LOW (ref >60)
Glucose, Bld: 220 mg/dL — ABNORMAL HIGH (ref 70–99)
Potassium: 4.3 mmol/L (ref 3.5–5.1)
Sodium: 142 mmol/L (ref 135–145)
Total Bilirubin: 0.4 mg/dL (ref 0.0–1.2)
Total Protein: 6.5 g/dL (ref 6.5–8.1)

## 2023-09-07 LAB — MAGNESIUM
Magnesium: 3.3 mg/dL — ABNORMAL HIGH (ref 1.7–2.4)
Magnesium: 2.8 mg/dL — ABNORMAL HIGH (ref 1.7–2.4)
Magnesium: 2.7 mg/dL — ABNORMAL HIGH (ref 1.7–2.4)

## 2023-09-07 LAB — TROPONIN I (HIGH SENSITIVITY)
Troponin I (High Sensitivity): 246 ng/L (ref <18)
Troponin I (High Sensitivity): 308 ng/L (ref <18)

## 2023-09-07 LAB — RESP PANEL BY RT-PCR (RSV, FLU A&B, COVID)  RVPGX2
Influenza A by PCR: NEGATIVE
Influenza B by PCR: NEGATIVE
Resp Syncytial Virus by PCR: NEGATIVE
SARS Coronavirus 2 by RT PCR: NEGATIVE

## 2023-09-07 LAB — BLOOD GAS, VENOUS
Acid-Base Excess: 3.6 mmol/L — ABNORMAL HIGH (ref 0.0–2.0)
Bicarbonate: 33.3 mmol/L — ABNORMAL HIGH (ref 20.0–28.0)
O2 Saturation: 39 %
Patient temperature: 36.2
pCO2, Ven: 73 mmHg (ref 44–60)
pH, Ven: 7.26 (ref 7.25–7.43)
pO2, Ven: <31 mmHg — CL (ref 32–45)

## 2023-09-07 LAB — LACTIC ACID, PLASMA
Lactic Acid, Venous: 2.9 mmol/L (ref 0.5–1.9)
Lactic Acid, Venous: 3.2 mmol/L (ref 0.5–1.9)
Lactic Acid, Venous: 4.9 mmol/L (ref 0.5–1.9)

## 2023-09-07 LAB — STREP PNEUMONIAE URINARY ANTIGEN: Strep Pneumo Urinary Antigen: NEGATIVE

## 2023-09-07 LAB — MRSA NEXT GEN BY PCR, NASAL: MRSA by PCR Next Gen: NOT DETECTED

## 2023-09-07 LAB — PROCALCITONIN: Procalcitonin: 0.11 ng/mL

## 2023-09-07 MED ORDER — BUDESONIDE 0.25 MG/2ML IN SUSP
0.2500 mg | Freq: Two times a day (BID) | RESPIRATORY_TRACT | Status: DC
  Administered 2023-09-07 – 2023-09-09 (×6): 0.25 mg via RESPIRATORY_TRACT
  Filled 2023-09-07 (×7): qty 2

## 2023-09-07 MED ORDER — PROPOFOL 1000 MG/100ML IV EMUL
0.0000 ug/kg/min | INTRAVENOUS | Status: DC
  Administered 2023-09-07: 50 ug/kg/min via INTRAVENOUS
  Administered 2023-09-07: 25 ug/kg/min via INTRAVENOUS
  Administered 2023-09-08: 15 ug/kg/min via INTRAVENOUS
  Administered 2023-09-08: 10 ug/kg/min via INTRAVENOUS
  Filled 2023-09-07 (×3): qty 100

## 2023-09-07 MED ORDER — INSULIN ASPART 100 UNIT/ML IJ SOLN
0.0000 [IU] | INTRAMUSCULAR | Status: DC
  Administered 2023-09-07 (×3): 2 [IU] via SUBCUTANEOUS
  Administered 2023-09-07: 3 [IU] via SUBCUTANEOUS
  Administered 2023-09-07: 2 [IU] via SUBCUTANEOUS
  Administered 2023-09-08: 3 [IU] via SUBCUTANEOUS
  Administered 2023-09-08 (×3): 2 [IU] via SUBCUTANEOUS
  Administered 2023-09-08: 1 [IU] via SUBCUTANEOUS
  Administered 2023-09-08: 3 [IU] via SUBCUTANEOUS
  Administered 2023-09-09: 2 [IU] via SUBCUTANEOUS
  Administered 2023-09-09: 1 [IU] via SUBCUTANEOUS
  Administered 2023-09-09: 3 [IU] via SUBCUTANEOUS
  Administered 2023-09-09: 1 [IU] via SUBCUTANEOUS
  Administered 2023-09-09: 2 [IU] via SUBCUTANEOUS

## 2023-09-07 MED ORDER — HEPARIN SODIUM (PORCINE) 5000 UNIT/ML IJ SOLN
5000.0000 [IU] | Freq: Three times a day (TID) | INTRAMUSCULAR | Status: DC
  Administered 2023-09-07 – 2023-09-08 (×4): 5000 [IU] via SUBCUTANEOUS
  Filled 2023-09-07 (×4): qty 1

## 2023-09-07 MED ORDER — VITAL HIGH PROTEIN PO LIQD
1000.0000 mL | ORAL | Status: DC
  Administered 2023-09-07: 1000 mL

## 2023-09-07 MED ORDER — FENTANYL 2500MCG IN NS 250ML (10MCG/ML) PREMIX INFUSION
0.0000 ug/h | INTRAVENOUS | Status: DC
  Administered 2023-09-07: 60 ug/h via INTRAVENOUS

## 2023-09-07 MED ORDER — LEVALBUTEROL HCL 0.63 MG/3ML IN NEBU
INHALATION_SOLUTION | RESPIRATORY_TRACT | Status: AC
  Administered 2023-09-07: 9 mg
  Filled 2023-09-07: qty 9

## 2023-09-07 MED ORDER — NOREPINEPHRINE 4 MG/250ML-% IV SOLN
0.0000 ug/min | INTRAVENOUS | Status: DC
  Administered 2023-09-07: 2 ug/min via INTRAVENOUS
  Administered 2023-09-07: 3 ug/min via INTRAVENOUS
  Administered 2023-09-08: 2 ug/min via INTRAVENOUS
  Filled 2023-09-07 (×2): qty 250

## 2023-09-07 MED ORDER — FUROSEMIDE 10 MG/ML IJ SOLN: 60.0000 mg | Freq: Two times a day (BID) | INTRAMUSCULAR | Status: DC

## 2023-09-07 MED ORDER — METHYLPREDNISOLONE SODIUM SUCC 40 MG IJ SOLR
40.0000 mg | INTRAMUSCULAR | Status: DC
  Administered 2023-09-08 – 2023-09-10 (×3): 40 mg via INTRAVENOUS
  Filled 2023-09-07 (×3): qty 1

## 2023-09-07 MED ORDER — ORAL CARE MOUTH RINSE
15.0000 mL | OROMUCOSAL | Status: DC
Start: 2023-09-07 — End: 2023-09-17
  Administered 2023-09-07 – 2023-09-17 (×93): 15 mL via OROMUCOSAL

## 2023-09-07 MED ORDER — SUCCINYLCHOLINE CHLORIDE 200 MG/10ML IV SOSY
PREFILLED_SYRINGE | INTRAVENOUS | Status: AC
  Filled 2023-09-07: qty 10

## 2023-09-07 MED ORDER — ETOMIDATE 2 MG/ML IV SOLN
INTRAVENOUS | Status: AC
  Filled 2023-09-07: qty 10

## 2023-09-07 MED ORDER — NOREPINEPHRINE 4 MG/250ML-% IV SOLN
INTRAVENOUS | Status: AC
  Administered 2023-09-07: 10 ug/min via INTRAVENOUS
  Filled 2023-09-07: qty 250

## 2023-09-07 MED ORDER — FENTANYL BOLUS VIA INFUSION
30.0000 ug | INTRAVENOUS | Status: DC | PRN
  Administered 2023-09-07: 30 ug via INTRAVENOUS

## 2023-09-07 MED ORDER — SODIUM CHLORIDE 0.9 % IV SOLN
2.0000 g | INTRAVENOUS | Status: AC
  Administered 2023-09-07 – 2023-09-11 (×5): 2 g via INTRAVENOUS
  Filled 2023-09-07 (×5): qty 20

## 2023-09-07 MED ORDER — PROSOURCE TF20 ENFIT COMPATIBL EN LIQD
60.0000 mL | Freq: Every day | ENTERAL | Status: DC
  Administered 2023-09-07 – 2023-09-08 (×2): 60 mL
  Filled 2023-09-07 (×2): qty 60

## 2023-09-07 MED ORDER — ARFORMOTEROL TARTRATE 15 MCG/2ML IN NEBU
15.0000 ug | INHALATION_SOLUTION | Freq: Two times a day (BID) | RESPIRATORY_TRACT | Status: DC
  Administered 2023-09-07 – 2023-09-09 (×6): 15 ug via RESPIRATORY_TRACT
  Filled 2023-09-07 (×7): qty 2

## 2023-09-07 MED ORDER — SUCCINYLCHOLINE CHLORIDE 20 MG/ML IJ SOLN
INTRAMUSCULAR | Status: DC | PRN
  Administered 2023-09-07: 100 mg via INTRAVENOUS

## 2023-09-07 MED ORDER — POLYETHYLENE GLYCOL 3350 17 G PO PACK: 17.0000 g | PACK | Freq: Every day | ORAL | Status: DC | PRN

## 2023-09-07 MED ORDER — ORAL CARE MOUTH RINSE: 15.0000 mL | OROMUCOSAL | Status: DC | PRN

## 2023-09-07 MED ORDER — REVEFENACIN 175 MCG/3ML IN SOLN
175.0000 ug | Freq: Every day | RESPIRATORY_TRACT | Status: DC
  Administered 2023-09-07 – 2023-09-09 (×3): 175 ug via RESPIRATORY_TRACT
  Filled 2023-09-07 (×4): qty 3

## 2023-09-07 MED ORDER — FAMOTIDINE IN NACL 20-0.9 MG/50ML-% IV SOLN
20.0000 mg | Freq: Every day | INTRAVENOUS | Status: DC
  Administered 2023-09-07: 20 mg via INTRAVENOUS
  Filled 2023-09-07: qty 50

## 2023-09-07 MED ORDER — METHYLPREDNISOLONE SODIUM SUCC 125 MG IJ SOLR
60.0000 mg | Freq: Two times a day (BID) | INTRAMUSCULAR | Status: DC
  Administered 2023-09-07: 60 mg via INTRAVENOUS
  Filled 2023-09-07: qty 2

## 2023-09-07 MED ORDER — SODIUM CHLORIDE 0.9 % IV SOLN
500.0000 mg | INTRAVENOUS | Status: AC
  Administered 2023-09-07 – 2023-09-11 (×5): 500 mg via INTRAVENOUS
  Filled 2023-09-07 (×6): qty 5

## 2023-09-07 MED ORDER — KETAMINE HCL 50 MG/5ML IJ SOSY
PREFILLED_SYRINGE | INTRAMUSCULAR | Status: AC
  Filled 2023-09-07: qty 5

## 2023-09-07 MED ORDER — CHLORHEXIDINE GLUCONATE CLOTH 2 % EX PADS
6.0000 | MEDICATED_PAD | Freq: Every day | CUTANEOUS | Status: DC
  Administered 2023-09-07 – 2023-09-17 (×10): 6 via TOPICAL

## 2023-09-07 MED ORDER — FENTANYL 2500MCG IN NS 250ML (10MCG/ML) PREMIX INFUSION
INTRAVENOUS | Status: AC
  Administered 2023-09-07: 25 ug/h via INTRAVENOUS
  Filled 2023-09-07: qty 250

## 2023-09-07 MED ORDER — FUROSEMIDE 10 MG/ML IJ SOLN
40.0000 mg | Freq: Once | INTRAMUSCULAR | Status: AC
  Administered 2023-09-07: 40 mg via INTRAVENOUS
  Filled 2023-09-07: qty 4

## 2023-09-07 MED ORDER — SODIUM CHLORIDE 0.9 % IV SOLN
10.0000 mg | Freq: Every day | INTRAVENOUS | Status: DC
  Administered 2023-09-08: 10 mg via INTRAVENOUS
  Filled 2023-09-07: qty 1

## 2023-09-07 MED ORDER — IPRATROPIUM BROMIDE 0.02 % IN SOLN
RESPIRATORY_TRACT | Status: AC
  Administered 2023-09-07: 1 mg
  Filled 2023-09-07: qty 5

## 2023-09-07 MED ORDER — PROPOFOL 1000 MG/100ML IV EMUL
INTRAVENOUS | Status: AC
  Administered 2023-09-07: 5 ug/kg/min via INTRAVENOUS
  Filled 2023-09-07: qty 100

## 2023-09-07 MED ORDER — KETAMINE HCL 10 MG/ML IJ SOLN
INTRAMUSCULAR | Status: DC | PRN
  Administered 2023-09-07: 50 mg via INTRAVENOUS

## 2023-09-07 MED ORDER — DOCUSATE SODIUM 50 MG/5ML PO LIQD: 100.0000 mg | Freq: Two times a day (BID) | ORAL | Status: DC | PRN

## 2023-09-07 NOTE — Plan of Care (Signed)
  Problem: Clinical Measurements: Goal: Ability to maintain clinical measurements within normal limits will improve Outcome: Progressing Goal: Respiratory complications will improve Outcome: Progressing Goal: Cardiovascular complication will be avoided Outcome: Progressing   Problem: Coping: Goal: Level of anxiety will decrease Outcome: Progressing   Problem: Pain Managment: Goal: General experience of comfort will improve and/or be controlled Outcome: Progressing

## 2023-09-07 NOTE — Progress Notes (Signed)
 Interim CCM Progress Note:   Patient has history of right pupil being slightly larger than left- per history.  See exam in H&P of 06/12/22 by TRH   Rey Catholic AGACNP-BC   [facility] 09/07/2023, 4:56 AM  Please see [email or web address] for pager details.  From 7A-7P if no response, please call [phone]. After hours, please call ELink [phone].

## 2023-09-07 NOTE — Assessment & Plan Note (Signed)
 Blood sugar control currently acceptable.  -Will add tube feed coverage.Aaron Aas

## 2023-09-07 NOTE — Assessment & Plan Note (Signed)
 Presently on nominal ventilator settings.  -Start to wean sedation and attempt SBT. -If agitation occurs, add enteral Seroquel  and consider switch to dexmedetomidine .

## 2023-09-07 NOTE — Progress Notes (Signed)
  Echocardiogram 2D Echocardiogram has been performed.  Carla [name] 09/07/2023, 11:31 AM

## 2023-09-07 NOTE — Progress Notes (Signed)
 eLink Physician-Brief Progress Note Patient Name: Carla [name] DOB: 03/26/1945 MRN: [id number]   Date of Service  09/07/2023  HPI/Events of Note  78 y.o. female with medical history significant of hypertension, hyperlipidemia, COPD, primary hyperparathyroidism, anxiety and tobacco abuse who presents to the emergency department from home via EMS due to shortness of breath.  Intubated in the setting of COPD exacerbation.  Vital signs are stable, currently mechanically ventilated with propofol , fentanyl , and low-dose norepinephrine  infusion.  Labs and imaging reviewed.  eICU Interventions  Maintain SVNs, steroids, and empiric antibiotics  Maintain mechanical ventilation, daily spontaneous awakening/breathing trials  Heparin  subcutaneous Famotidine  for GI prophylaxis     Intervention Category Evaluation Type: New Patient Evaluation  Seymone Forlenza 09/07/2023, 4:47 AM

## 2023-09-07 NOTE — Consult Note (Signed)
 Palliative Medicine Inpatient Consult Note  Consulting Provider: Sula End, NP   Reason for consult:   Palliative Care Consult Services Palliative Medicine Consult  Reason for Consult? Establish GOC   09/07/2023  HPI:  Per intake H&P -->   78 y/o female with PMH for COPD/Emphysema, HTN, Dementia (AAOx1 at baseline), Anxiety, Depression, GERD, Primary hyperparathyroidism, Vit D Def, HLD, Osteoporosis, who was BIB EMS to [facility] for difficulty breathing, Per EMS RA sats 70% and placed on 15L sats 96%, per EMS and family she has not been eating well x 1 week.   Palliative care has seen Carla [name] in the past in April of '24 and March of '25. We have been consulted during this hospitalization for goals of care conversations.   Clinical Assessment/Goals of Care:  *Please note that this is a verbal dictation therefore any spelling or grammatical errors are due to the Dragon Medical One system interpretation.  I have reviewed medical records including EPIC notes, labs and imaging, received report from bedside RN, assessed the patient who is lying in bed intubated and sedated.    I spoke to patients spouse, Dough over the phone to further discuss diagnosis prognosis, GOC, EOL wishes, disposition and options.   I introduced Palliative Medicine as specialized medical care for people living with serious illness. It focuses on providing relief from the symptoms and stress of a serious illness. The goal is to improve quality of life for both the patient and the family.  Medical History Review and Understanding:  A review of Carla [name]'s past medical history significant for COPD, Emphysema, HTN, dementia, anxiety, depression, HLD, OP, GERD, and primary hyperparathyroidism.   Social History:  Carla [name] is from [place], [place] . She has been married to the patients spouse for > 50 years. The patient has three son(s) though one is deceased. She has multiple grandchildren. She formerly worked in [employer]  works. 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 She is a woman of strong faith practicing within the [employer] denomination.   Functional and Nutritional State:  Per patients spouse she was able to bathroom herself, feed herself, mobilize on her own, and dress herself. At times Carla [name]'s granddaughter would come to help bath her. She had a decently good appetite at baseline.   Patients two sons and spouse take shifts, they try to not leave her alone at anytime.   Advance Directives:  A detailed discussion was had today regarding advanced directives.  Patients spouse, Carla [name] is her Runner, broadcasting/film/video.   Code Status:  Concepts specific to code status, artifical feeding and hydration, continued IV antibiotics and rehospitalization was had.  The difference between a aggressive medical intervention path  and a palliative comfort care path for this patient at this time was had.   Encouraged Doug to consider no chest compressions if she were to go into cardiac arrest. I shared given Carla [name]'s frailty I worry she may not fair well if we compress deeply and rapidly upon her chest cavity. He shares that he would at this time at least like for us  to continue all measures.  Dough and I discussed concerns as they related to patients with severe COPD and mechanical ventilatory support. He shares that she has been intubated various times in the past and has always done fairly well.   Discussion:  Discussions related to Carla [name]'s present hospitalization were held. We discussed patients septic shock and the measures being pursued to improve her present condition. We discussed her LLL PNA,  COPD, and respiratory failure.  Patient spouse shares that he knows Carla [name] would, want to live and he would like for the medical team to continue all present measures.  Dough does note if Carla [name] was ever in a position whereby improvement were not likely that he would not want her to lay in bed in a vegetative state for an  indefinite amount of time.   Discussed the importance of continued conversation with family and their  medical providers regarding overall plan of care and treatment options, ensuring decisions are within the context of the patients values and GOCs.  Decision Maker: Carla [name] (Spouse):[phone] (Mobile)   SUMMARY OF RECOMMENDATIONS   Full Code / Full scope of care   Open and honest conversations held in the setting of patients acute on chronic disease burden  Goals of patients family are to continue present care with the hope of improvement  Ongoing PMT support  Code Status/Advance Care Planning: FULL CODE   Palliative Prophylaxis:  Aspiration, Bowel Regimen, Delirium Protocol, Frequent Pain Assessment, Oral Care, Palliative Wound Care, and Turn Reposition  Additional Recommendations (Limitations, Scope, Preferences): Continue present care  Psycho-social/Spiritual:  Desire for further Chaplaincy support: Yes Additional Recommendations: Education on disease burden   Prognosis: High 12 month mortality risk.  Discharge Planning: To be determined.   Vitals:   09/07/23 0730 09/07/23 0745  BP: (!) 94/53 103/61  Pulse: 64 (!) 59  Resp: (!) 26 (!) 26  Temp: 98.2 F (36.8 C) 98.2 F (36.8 C)  SpO2: 100% 100%    Intake/Output Summary (Last 24 hours) at 09/07/2023 4098 Last data filed at 09/07/2023 1191 Gross per 24 hour  Intake 551.43 ml  Output 45 ml  Net 506.43 ml   Last Weight  Most recent update: 09/07/2023  1:29 AM    Weight  41.4 kg (91 lb 4.3 oz)            Gen:  Elderly Caucasian F chronically ill appearing HEENT: ETT, OGT, dry mucous membranes CV: Regular rate and rhythm  PULM: On mechanical ventilator ABD: soft/nontender  EXT: No edema  Neuro: Sedated  PPS: 10%   This conversation/these recommendations were discussed with patient primary care team, Dr. Mason Sole ______________________________________________________ [facility]  Health Palliative Medicine Team Team Cell Phone: [phone] Please utilize secure chat with additional questions, if there is no response within 30 minutes please call the above phone number  Total Time: 75 Billing based on MDM: High  Palliative Medicine Team providers are available by phone from 7am to 7pm daily and can be reached through the team cell phone.  Should this patient require assistance outside of these hours, please call the patient's attending physician.

## 2023-09-07 NOTE — Plan of Care (Signed)
  Problem: Education: Goal: Knowledge of General Education information will improve Description: Including pain rating scale, medication(s)/side effects and non-pharmacologic comfort measures Outcome: Not Progressing   Problem: Clinical Measurements: Goal: Ability to maintain clinical measurements within normal limits will improve Outcome: Progressing Goal: Diagnostic test results will improve Outcome: Progressing Goal: Respiratory complications will improve Outcome: Progressing Goal: Cardiovascular complication will be avoided Outcome: Progressing   Problem: Activity: Goal: Risk for activity intolerance will decrease Outcome: Not Progressing   Problem: Nutrition: Goal: Adequate nutrition will be maintained Outcome: Progressing

## 2023-09-07 NOTE — H&P (Signed)
 NAME:  Carla [name], MRN:  [id number], DOB:  [birth year], LOS: 1 ADMISSION DATE:  09/06/2023, CONSULTATION DATE:  09/07/2023 REFERRING MD:  Gwenetta Lennert, MD, CHIEF COMPLAINT:  SOB  History of Present Illness:  78 y/o female with PMH for COPD/Emphysema, HTN, Dementia (AAOx1 at baseline), Anxiety, Depression, GERD, Primary hyperparathyroidism, Vit D Def, HLD, Osteoporosis, who was BIB EMS to [facility] for difficulty breathing, Per EMS RA sats 70% and placed on 15L sats 96%, per EMS and family she has not been eating well x 1 week. In ED BiPAP was tried but failed and given her confusion she was not keeping the mask on and high flow also did not work.  She was subsequently intubated.  She was admitted for COPD exacerbation and she became hypotensive requiring Levophed .  Family wants full code per documentation at [facility]. PH 7.26 PCO2 73 on VBG, BNP >4500, WBC 13, Cr 1.3, cxr showing left basilar infiltrate. Pertinent  Medical History   Anxiety, ASCVD (arteriosclerotic cardiovascular disease), Atrophic vaginitis (04/18/2016), Back pain, Cervical disc herniation, Chronic bronchitis, Colon polyps, De Quervain's disease (radial styloid tenosynovitis) (10/14/2016), Dementia (HCC) (06/10/2020), Depression, Elevated hemoglobin A1c, Fasting hyperglycemia, GERD (gastroesophageal reflux disease), Heart disease, HSV (herpes simplex virus) infection, Hydronephrosis, Hypercalcemia (12/30/2008), Hypercholesteremia, Hypertension, Neck muscle spasm (04/23/2014), Onychomycosis (07/08/2015), Osteoporosis, Peripheral vascular disease (HCC), Secondary erythrocytosis (08/05/2012), Tobacco abuse, and 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  Significant Hospital Events: Including procedures, antibiotic start and stop dates in addition to other pertinent events   6/15: Transfer from [facility] to [facility] ICU admit, intubated on Levophed   Interim History / Subjective:  N/A  Objective    Blood pressure (!) 99/37, pulse 70, temperature 98.1 F (36.7  C), temperature source Bladder, resp. rate (!) 22, height 5' (1.524 m), weight 41.4 kg, SpO2 100%.    Vent Mode: PRVC FiO2 (%):  [40 %-60 %] 60 % Set Rate:  [26 bmp] 26 bmp Vt Set:  [360 mL-500 mL] 360 mL PEEP:  [5 cmH20] 5 cmH20 Plateau Pressure:  [16 cmH20] 16 cmH20   Intake/Output Summary (Last 24 hours) at 09/07/2023 0446 Last data filed at 09/07/2023 1324 Gross per 24 hour  Intake 397.7 ml  Output 15 ml  Net 382.7 ml   Filed Weights   09/07/23 0126  Weight: 41.4 kg    Examination: General: sedated on mech vent NAD HENT: right pupil larger than left (previously noted) Lungs: CTA no wheezes no rales no rhonchi Cardiovascular: reg s1s2 no murmurs gallops or rubs Abdomen: soft nt nd bs pos Extremities: no cyanosis, clubbing or edema Neuro: sedated and intubated   Resolved problem list   Assessment and Plan  Acute Hypercapnic respiratory failure Continue with Vent management VAP prevention SBT/SAT daily Monitor ABGs for PCO2 improvements COPD exacerbation Continue with nebs/O2/Vent Start Solumedrol Sepsis Currently on Levophed , wean as tolerated Broad spectrum antibiotics Holding IVF secondary to increased BNP Pan culture Hypotension Levophed  support and wen as tolerated Sepsis/PNA/COPD treatment Community Acquired Pneumonia Broad spectrum antibiotics Tracheal aspirated for cultures  Lactic and  respiratory acidosis Should improve as sepsis improves and better perfusion Increased Troponins Already trending down, likely demand ischemia Increased BNP Get echo Diuresis as possible Monitor Is/O's  Best Practice (right click and Reselect all SmartList Selections daily)   Diet/type: NPO DVT prophylaxis prophylactic heparin   Pressure ulcer(s): N/A GI prophylaxis: H2B Lines: N/A Foley:  Yes, and it is still needed Code Status:  full code   Labs   CBC: Recent Labs  Lab 09/06/23  2226  WBC 13.1*  NEUTROABS 9.9*  HGB 14.4  HCT 46.1*  MCV 97.3   PLT 412*    Basic Metabolic Panel: Recent Labs  Lab 09/06/23 2226  NA 139  K 4.3  CL 100  CO2 28  GLUCOSE 203*  BUN 17  CREATININE 1.13*  CALCIUM  9.2   GFR: Estimated Creatinine Clearance: 26.8 mL/min (A) (by C-G formula based on SCr of 1.13 mg/dL (H)). Recent Labs  Lab 09/06/23 2226 09/07/23 0013  WBC 13.1*  --   LATICACIDVEN 2.7* 3.2*    Liver Function Tests: Recent Labs  Lab 09/06/23 2226  AST 25  ALT 17  ALKPHOS 113  BILITOT 0.6  PROT 7.0  ALBUMIN 3.3*   No results for input(s): LIPASE, AMYLASE in the last 168 hours. No results for input(s): AMMONIA in the last 168 hours.  ABG    Component Value Date/Time   PHART 7.35 09/07/2023 0230   PCO2ART 45 09/07/2023 0230   PO2ART 90 09/07/2023 0230   HCO3 25.4 09/07/2023 0230   TCO2 38 (H) 06/17/2022 1114   ACIDBASEDEF 0.8 09/07/2023 0230   O2SAT 97.7 09/07/2023 0230     Coagulation Profile: Recent Labs  Lab 09/06/23 2226  INR 1.0    Cardiac Enzymes: No results for input(s): CKTOTAL, CKMB, CKMBINDEX, TROPONINI in the last 168 hours.  HbA1C: Hgb A1c MFr Bld  Date/Time Value Ref Range Status  06/02/2023 04:36 AM 6.0 (H) 4.8 - 5.6 % Final    Comment:    (NOTE) Pre diabetes:          5.7%-6.4%  Diabetes:              >6.4%  Glycemic control for   <7.0% adults with diabetes   06/12/2022 06:31 PM 6.0 (H) 4.8 - 5.6 % Final    Comment:    (NOTE)         Prediabetes: 5.7 - 6.4         Diabetes: >6.4         Glycemic control for adults with diabetes: <7.0     CBG: Recent Labs  Lab 09/07/23 0358  GLUCAP 223*    Review of Systems:   Unable to obtain, intubated/sedated  Past Medical History:  She,  has a past medical history of Anxiety, ASCVD (arteriosclerotic cardiovascular disease), Atrophic vaginitis (04/18/2016), Back pain, Cervical disc herniation, Chronic bronchitis, Colon polyps, De Quervain's disease (radial styloid tenosynovitis) (10/14/2016), Dementia (HCC)  (06/10/2020), Depression, Elevated hemoglobin A1c, Fasting hyperglycemia, GERD (gastroesophageal reflux disease), Heart disease, HSV (herpes simplex virus) infection, Hydronephrosis, Hypercalcemia (12/30/2008), Hypercholesteremia, Hypertension, Neck muscle spasm (04/23/2014), Onychomycosis (07/08/2015), Osteoporosis, Peripheral vascular disease (HCC), Secondary erythrocytosis (08/05/2012), Tobacco abuse, and 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   Surgical History:   Past Surgical History:  Procedure Laterality Date   ABDOMINAL HYSTERECTOMY     aorta bifem graft     CHOLECYSTECTOMY     COLONOSCOPY  06/23/2006   Dr. Fields:Normal colon without evidence of polyps, masses, inflammatory changes/Normal retroflex view of the rectum   COLONOSCOPY N/A 08/24/2013   Procedure: COLONOSCOPY;  Surgeon: Alyce Jubilee, MD;  Location: [facility];  Service: Endoscopy;  Laterality: N/A;  10:30-moved to [address] notified pt   lysis of adhesions  2000   PARTIAL HYSTERECTOMY     right kidney surgery following damage during arterial surgery     TOTAL ABDOMINAL HYSTERECTOMY W/ BILATERAL SALPINGOOPHORECTOMY  1975     Social History:   reports that she  has been smoking cigarettes. She started smoking about 62 years ago. She has a 25 pack-year smoking history. She has never used smokeless tobacco. She reports that she does not drink alcohol and does not use drugs.   Family History:  Her family history includes COPD in her brother, sister, and sister; Coronary artery disease in her sister; Diabetes in her sister, sister, and sister; Heart attack in her father, mother, and son; Heart disease in her sister; Liver cancer (age of onset: 90) in her sister. There is no history of Colon cancer.   Allergies Allergies  Allergen Reactions   Aricept  [Donepezil  Hcl] Diarrhea   Calcium -Containing Compounds Other (See Comments)    Pt has hyperparathyroidism which results in hypercalcemia   Exelon  [Rivastigmine  Tartrate]  Rash   Exelon  [Rivastigmine ] Rash     Home Medications  Prior to Admission medications   Medication Sig Start Date End Date Taking? Authorizing Provider  acetaminophen  (TYLENOL ) 325 MG tablet Take 2 tablets (650 mg total) by mouth every 6 (six) hours as needed for mild pain (pain score 1-3) (or Fever >/= 101). 06/11/23   Shahmehdi, Constantino Demark, MD  ALPRAZolam  (XANAX ) 0.25 MG tablet Take 1 tablet (0.25 mg total) by mouth 2 (two) times daily. 06/13/23   Jodeane Mulligan, DO  amLODipine  (NORVASC ) 5 MG tablet Take 5 mg by mouth daily.    [provider]  arformoterol  (BROVANA ) 15 MCG/2ML NEBU Take 2 mLs (15 mcg total) by nebulization 2 (two) times daily. 06/11/23   Shahmehdi, Seyed A, MD  ASHWAGANDHA PO Take 1 capsule by mouth at bedtime.    [provider]  azithromycin  (ZITHROMAX ) 250 MG tablet Take 1 tablet (250 mg total) by mouth daily. 06/14/23   Orvilla Blander, MD  levalbuterol  (XOPENEX ) 0.63 MG/3ML nebulizer solution Take 3 mLs (0.63 mg total) by nebulization 2 (two) times daily. 06/11/23   Shahmehdi, Constantino Demark, MD  metoprolol  succinate (TOPROL -XL) 25 MG 24 hr tablet Take 1 tablet (25 mg total) by mouth daily. 06/12/23   Shahmehdi, Constantino Demark, MD  mirtazapine  (REMERON  SOL-TAB) 30 MG disintegrating tablet Take 1 tablet (30 mg total) by mouth at bedtime. 06/13/23   Jodeane Mulligan, DO  Nebulizers (COMPRESSOR/NEBULIZER) MISC 1 each by Does not apply route as directed. 06/13/23   Jodeane Mulligan, DO  nicotine  (NICODERM CQ  - DOSED IN MG/24 HOURS) 14 mg/24hr patch Place 1 patch (14 mg total) onto the skin daily as needed (nicotine  craving). 06/11/23   Shahmehdi, Constantino Demark, MD  QUEtiapine  (SEROQUEL ) 100 MG tablet Take 1 tablet (100 mg total) by mouth at bedtime. 06/13/23   Jodeane Mulligan, DO  revefenacin  (YUPELRI ) 175 MCG/3ML nebulizer solution Take 3 mLs (175 mcg total) by nebulization daily. 06/12/23   Shahmehdi, Constantino Demark, MD  senna-docusate (SENOKOT-S) 8.6-50 MG tablet Take 1 tablet by mouth at  bedtime. 06/11/23   Shahmehdi, Constantino Demark, MD  tamsulosin  (FLOMAX ) 0.4 MG CAPS capsule Take 1 capsule (0.4 mg total) by mouth daily. 06/13/23   Jodeane Mulligan, DO     Critical care time: 46   The patient is critically ill with multiple organ system failure and requires high complexity decision making for assessment and support, frequent evaluation and titration of therapies, advanced monitoring, review of radiographic studies and interpretation of complex data.   Critical Care Time devoted to patient care services, exclusive of separately billable procedures, described in this note is 32 minutes.   Claven Cumming, MD [facility] See  Amion for pager  If no response to pager , please call [phone] until 7pm After 7:00 pm call Elink  [phone] 09/07/2023, 4:47 AM

## 2023-09-07 NOTE — Progress Notes (Addendum)
 NAME:  Carla [name], MRN:  [id number], DOB:  1945-12-07, LOS: 1 ADMISSION DATE:  09/06/2023, CONSULTATION DATE:  09/07/2023 REFERRING MD:  Gwenetta Lennert, MD, CHIEF COMPLAINT:  SOB  History of Present Illness:  78 y/o female with PMH for COPD/Emphysema, HTN, Dementia (AAOx1 at baseline), Anxiety, Depression, GERD, Primary hyperparathyroidism, Vit D Def, HLD, Osteoporosis, who was BIB EMS to [facility] for difficulty breathing, Per EMS RA sats 70% and placed on 15L sats 96%, per EMS and family she has not been eating well x 1 week. In ED BiPAP was tried but failed and given her confusion she was not keeping the mask on and high flow also did not work.  She was subsequently intubated.  She was admitted for COPD exacerbation and she became hypotensive requiring Levophed .  Family wants full code per documentation at [facility]. PH 7.26 PCO2 73 on VBG, BNP >4500, WBC 13, Cr 1.3, cxr showing left basilar infiltrate. Pertinent  Medical History   Anxiety, ASCVD (arteriosclerotic cardiovascular disease), Atrophic vaginitis (04/18/2016), Back pain, Cervical disc herniation, Chronic bronchitis, Colon polyps, De Quervain's disease (radial styloid tenosynovitis) (10/14/2016), Dementia (HCC) (06/10/2020), Depression, Elevated hemoglobin A1c, Fasting hyperglycemia, GERD (gastroesophageal reflux disease), Heart disease, HSV (herpes simplex virus) infection, Hydronephrosis, Hypercalcemia (12/30/2008), Hypercholesteremia, Hypertension, Neck muscle spasm (04/23/2014), Onychomycosis (07/08/2015), Osteoporosis, Peripheral vascular disease (HCC), Secondary erythrocytosis (08/05/2012), Tobacco abuse, and 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  Significant Hospital Events: Including procedures, antibiotic start and stop dates in addition to other pertinent events   6/15: Transfer from [facility] to [facility] ICU admit, intubated on Levophed   Interim History / Subjective:   Became easily agitated with sedation weaning last night.   Objective    Blood  pressure (!) 105/54, pulse (!) 58, temperature 98.4 F (36.9 C), resp. rate (!) 26, height 5' (1.524 m), weight 41.4 kg, SpO2 100%.    Vent Mode: PRVC FiO2 (%):  [40 %-60 %] 40 % Set Rate:  [26 bmp] 26 bmp Vt Set:  [360 mL-630 mL] 630 mL PEEP:  [5 cmH20] 5 cmH20 Plateau Pressure:  [16 cmH20] 16 cmH20   Intake/Output Summary (Last 24 hours) at 09/07/2023 0841 Last data filed at 09/07/2023 7829 Gross per 24 hour  Intake 551.43 ml  Output 90 ml  Net 461.43 ml   Filed Weights   09/07/23 0126  Weight: 41.4 kg    Examination: General: Frail appearing woman HENT: ETT and OGT in place. Oral mucosa is dry. Lungs: Crackles and bronchial breath sounds at the left base. No wheezing.  On nominal ventilator settings with acceptable airway pressures. Cardiovascular: HS normal. Extremities are warm Abdomen: soft  Extremities: Decreased muscle mass. No edema.  Neuro: sedated and intubated  Ancillary Tests   CXR shows LLL opacification - combination of infiltrate with effusion.  Background of severe COPD with chronic interstitial changes and flattened diaphragms. Creatinine is mildly elevated at 1.36.  Markedly elevated BNP.  Assessment and Plan  Acute on chronic respiratory failure with hypoxia and hypercapnia (HCC) Presently on nominal ventilator settings.  -Start to wean sedation and attempt SBT. -If agitation occurs, add enteral Seroquel  and consider switch to dexmedetomidine .  CAP (community acquired pneumonia) Viral studies are negative.  Focal infiltrate on the left side with possible small parapneumonic effusion most consistent with bacterial pneumonia.  -Respiratory cultures are pending. - Complete empiric course of antibiotics for community-acquired pneumonia.  COPD with acute exacerbation (HCC) Currently not wheezing on examination.  Significant hyperinflation on exam.  -Continue current bronchodilators. -Short course of  steroids which we will start to taper now to limit  myopathic effects and already frail woman.  Septic shock (HCC) Still requiring low-dose vasopressors. Sedation use also contributing.  High BNP suggest possible cardiogenic component.  - Wean propofol  preferentially. -Await echocardiogram to help guide fluid therapy. -Overall, vasopressor requirements continue to decrease.  Consider fluid challenge if increasing pressor requirements.  Elevated brain natriuretic peptide (BNP) level Normal EF in 3/25.  Currently BNP greater than 4500, highest reading noted in chart. Clinically does not appear volume overloaded.  Chest x-ray more compatible with COPD and pneumonia than heart failure.  -Await repeat echocardiogram to help guide hemodynamic management.  Elevated troponin Very mild troponin elevation consistent with demand related injury.  - Echo pending.  Supportive care.  Hyperglycemia Blood sugar control currently acceptable.  -Will add tube feed coverage..  Dementia (HCC) Patient's function has been declining.  -Palliative care consultation to establish longer-term goals of care.    Best Practice (right click and Reselect all SmartList Selections daily)   Diet/type: NPO -start tube feeds DVT prophylaxis prophylactic heparin   Pressure ulcer(s): N/A GI prophylaxis: H2B Lines: N/A Foley:  Yes, and it is still needed Code Status:  full code  CRITICAL CARE Performed by: Arlina Lair   Total critical care time: 45 minutes  Critical care time was exclusive of separately billable procedures and treating other patients.  Critical care was necessary to treat or prevent imminent or life-threatening deterioration.  Critical care was time spent personally by me on the following activities: development of treatment plan with patient and/or surrogate as well as nursing, discussions with consultants, evaluation of patient's response to treatment, examination of patient, obtaining history from patient or surrogate, ordering and  performing treatments and interventions, ordering and review of laboratory studies, ordering and review of radiographic studies, pulse oximetry, re-evaluation of patient's condition and participation in multidisciplinary rounds.  Arlina Lair, MD [facility] ICU Physician [facility]  Pager: [phone] Mobile: [phone] After hours: [phone].

## 2023-09-07 NOTE — Assessment & Plan Note (Signed)
 Currently not wheezing on examination.  Significant hyperinflation on exam.  -Continue current bronchodilators. -Short course of steroids which we will start to taper now to limit myopathic effects and already frail woman.
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
## 2023-09-07 NOTE — Progress Notes (Signed)
 eLink Physician-Brief Progress Note Patient Name: ROSETTE BELLAVANCE DOB: [birth year] MRN: [id number]   Date of Service  09/07/2023  HPI/Events of Note  78 y/o female with PMH for COPD/Emphysema, HTN, Dementia (AAOx1 at baseline), Anxiety, Depression, GERD, Primary hyperparathyroidism, Vit D Def, HLD, Osteoporosis, who was BIB EMS to [facility] for difficulty breathing, Intubated  eICU Interventions  Add Fentanyl  bolus from bag PRN to maintain RASS/CPOT   0441 -low urine output, BNP elevated, radiographically fluid overloaded.  Worsening renal failure and likely ATN. Response to Lasix  insulin  elevated.  Continue observation for now  Intervention Category Minor Interventions: Routine modifications to care plan (e.g. PRN medications for pain, fever)  Lavonta Tillis 09/07/2023, 7:28 PM

## 2023-09-07 NOTE — Assessment & Plan Note (Signed)
 Viral studies are negative.  Focal infiltrate on the left side with possible small parapneumonic effusion most consistent with bacterial pneumonia.  -Respiratory cultures are pending. - Complete empiric course of antibiotics for community-acquired pneumonia.

## 2023-09-07 NOTE — ED Notes (Signed)
Dentures given to family.

## 2023-09-07 NOTE — Assessment & Plan Note (Signed)
 Very mild troponin elevation consistent with demand related injury.  - Echo pending.  Supportive care.

## 2023-09-07 NOTE — ED Provider Notes (Signed)
 Called to bedside for worsening clinical status.  Patient becoming agitated, altered, pulled off her BiPAP.  Reportedly admitted for COPD exacerbation.  Labs show a prominently elevated BNP as well and so likely a component of pulmonary edema.  Patient on my immediate exam looks ill, hypotensive, and adequately breathing, mental status no longer appropriate for BiPAP.  She is barely tolerating nonrebreather, wakes up and pulls off anything on her face.  Case discussed with family, they confirmed that she is full code.  Intubated as described below.  Hospitalist to facilitate transfer to [facility], ICU.  [facility] AasCritical Care  Performed by: Edson Graces, MD Authorized by: Edson Graces, MD   Critical care provider statement:    Critical care time (minutes):  35   Critical care was necessary to treat or prevent imminent or life-threatening deterioration of the following conditions:  Respiratory failure   Critical care was time spent personally by me on the following activities:  Development of treatment plan with patient or surrogate, discussions with consultants, evaluation of patient's response to treatment, examination of patient, ordering and review of laboratory studies, ordering and review of radiographic studies, ordering and performing treatments and interventions, pulse oximetry, re-evaluation of patient's condition and review of old charts Procedure Name: Intubation Date/Time: 09/07/2023 3:08 AM  Performed by: Edson Graces, MDPre-anesthesia Checklist: Patient identified, Patient being monitored, Emergency Drugs available, Timeout performed and Suction available Oxygen  Delivery Method: Non-rebreather mask Preoxygenation: Pre-oxygenation with 100% oxygen  Induction Type: Rapid sequence Ventilation: Mask ventilation without difficulty Laryngoscope Size: Glidescope and 3 Grade View: Grade I Tube size: 7.5 mm Number of attempts: 1 Airway Equipment and Method: Rigid stylet Placement  Confirmation: ETT inserted through vocal cords under direct vision, CO2 detector and Breath sounds checked- equal and bilateral Secured at: 21 cm Tube secured with: ETT holder Comments: 50 mg ketamine and 100 mg succinylcholine         Edson Graces, MD 09/07/23 [phone]

## 2023-09-07 NOTE — Assessment & Plan Note (Signed)
 Normal EF in 3/25.  Currently BNP greater than 4500, highest reading noted in chart. Clinically does not appear volume overloaded.  Chest x-ray more compatible with COPD and pneumonia than heart failure.  -Await repeat echocardiogram to help guide hemodynamic management.

## 2023-09-07 NOTE — Assessment & Plan Note (Signed)
 Patient's function has been declining.  -Palliative care consultation to establish longer-term goals of care.

## 2023-09-07 NOTE — Assessment & Plan Note (Signed)
 Still requiring low-dose vasopressors. Sedation use also contributing.  High BNP suggest possible cardiogenic component.  - Wean propofol  preferentially. -Await echocardiogram to help guide fluid therapy. -Overall, vasopressor requirements continue to decrease.  Consider fluid challenge if increasing pressor requirements.
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
## 2023-09-08 ENCOUNTER — Other Ambulatory Visit: Payer: Self-pay

## 2023-09-08 DIAGNOSIS — Z9911 Dependence on respirator [ventilator] status: Secondary | ICD-10-CM

## 2023-09-08 DIAGNOSIS — Z515 Encounter for palliative care: Secondary | ICD-10-CM | POA: Diagnosis not present

## 2023-09-08 DIAGNOSIS — J441 Chronic obstructive pulmonary disease with (acute) exacerbation: Secondary | ICD-10-CM | POA: Diagnosis not present

## 2023-09-08 DIAGNOSIS — J9621 Acute and chronic respiratory failure with hypoxia: Secondary | ICD-10-CM | POA: Diagnosis not present

## 2023-09-08 DIAGNOSIS — J90 Pleural effusion, not elsewhere classified: Secondary | ICD-10-CM | POA: Diagnosis not present

## 2023-09-08 DIAGNOSIS — F03C11 Unspecified dementia, severe, with agitation: Secondary | ICD-10-CM

## 2023-09-08 DIAGNOSIS — Z7189 Other specified counseling: Secondary | ICD-10-CM | POA: Diagnosis not present

## 2023-09-08 DIAGNOSIS — N179 Acute kidney failure, unspecified: Secondary | ICD-10-CM

## 2023-09-08 DIAGNOSIS — R579 Shock, unspecified: Secondary | ICD-10-CM

## 2023-09-08 DIAGNOSIS — I5020 Unspecified systolic (congestive) heart failure: Secondary | ICD-10-CM

## 2023-09-08 DIAGNOSIS — J9622 Acute and chronic respiratory failure with hypercapnia: Secondary | ICD-10-CM | POA: Diagnosis not present

## 2023-09-08 DIAGNOSIS — I214 Non-ST elevation (NSTEMI) myocardial infarction: Secondary | ICD-10-CM

## 2023-09-08 LAB — BLOOD GAS, VENOUS
Acid-Base Excess: 4.3 mmol/L — ABNORMAL HIGH (ref 0.0–2.0)
Bicarbonate: 29.8 mmol/L — ABNORMAL HIGH (ref 20.0–28.0)
O2 Saturation: 77.8 %
Patient temperature: 37.2
pCO2, Ven: 47 mmHg (ref 44–60)
pH, Ven: 7.41 (ref 7.25–7.43)
pO2, Ven: 49 mmHg — ABNORMAL HIGH (ref 32–45)

## 2023-09-08 LAB — CBC
HCT: 42.2 % (ref 36.0–46.0)
Hemoglobin: 12.9 g/dL (ref 12.0–15.0)
MCH: 29.5 pg (ref 26.0–34.0)
MCHC: 30.6 g/dL (ref 30.0–36.0)
MCV: 96.6 fL (ref 80.0–100.0)
Platelets: 228 10*3/uL (ref 150–400)
RBC: 4.37 MIL/uL (ref 3.87–5.11)
RDW: 15 % (ref 11.5–15.5)
WBC: 24.4 10*3/uL — ABNORMAL HIGH (ref 4.0–10.5)
nRBC: 0 % (ref 0.0–0.2)

## 2023-09-08 LAB — COOXEMETRY PANEL
Carboxyhemoglobin: 1.5 % (ref 0.5–1.5)
Methemoglobin: <0.7 % (ref 0.0–1.5)
O2 Saturation: 73.6 %
Total hemoglobin: 11.8 g/dL — ABNORMAL LOW (ref 12.0–16.0)

## 2023-09-08 LAB — BASIC METABOLIC PANEL WITH GFR
Anion gap: 11 (ref 5–15)
BUN: 40 mg/dL — ABNORMAL HIGH (ref 8–23)
CO2: 25 mmol/L (ref 22–32)
Calcium: 9.1 mg/dL (ref 8.9–10.3)
Chloride: 103 mmol/L (ref 98–111)
Creatinine, Ser: 1.51 mg/dL — ABNORMAL HIGH (ref 0.44–1.00)
GFR, Estimated: 35 mL/min — ABNORMAL LOW (ref >60)
Glucose, Bld: 163 mg/dL — ABNORMAL HIGH (ref 70–99)
Potassium: 4.3 mmol/L (ref 3.5–5.1)
Sodium: 139 mmol/L (ref 135–145)

## 2023-09-08 LAB — PHOSPHORUS
Phosphorus: 4.1 mg/dL (ref 2.5–4.6)
Phosphorus: 3.5 mg/dL (ref 2.5–4.6)

## 2023-09-08 LAB — MAGNESIUM
Magnesium: 2.6 mg/dL — ABNORMAL HIGH (ref 1.7–2.4)
Magnesium: 2.5 mg/dL — ABNORMAL HIGH (ref 1.7–2.4)

## 2023-09-08 LAB — CREATININE, URINE, RANDOM: Creatinine, Urine: 100 mg/dL

## 2023-09-08 LAB — GLUCOSE, CAPILLARY
Glucose-Capillary: 156 mg/dL — ABNORMAL HIGH (ref 70–99)
Glucose-Capillary: 169 mg/dL — ABNORMAL HIGH (ref 70–99)
Glucose-Capillary: 189 mg/dL — ABNORMAL HIGH (ref 70–99)
Glucose-Capillary: 192 mg/dL — ABNORMAL HIGH (ref 70–99)
Glucose-Capillary: 211 mg/dL — ABNORMAL HIGH (ref 70–99)
Glucose-Capillary: 244 mg/dL — ABNORMAL HIGH (ref 70–99)

## 2023-09-08 MED ORDER — FAMOTIDINE 20 MG PO TABS
10.0000 mg | ORAL_TABLET | Freq: Every day | ORAL | Status: DC
  Administered 2023-09-09: 10 mg
  Filled 2023-09-08: qty 1

## 2023-09-08 MED ORDER — SODIUM CHLORIDE 0.9% FLUSH
10.0000 mL | Freq: Two times a day (BID) | INTRAVENOUS | Status: DC
  Administered 2023-09-08 – 2023-09-11 (×6): 10 mL
  Administered 2023-09-12: 30 mL
  Administered 2023-09-13 – 2023-09-17 (×8): 10 mL

## 2023-09-08 MED ORDER — QUETIAPINE FUMARATE 25 MG PO TABS
25.0000 mg | ORAL_TABLET | Freq: Two times a day (BID) | ORAL | Status: DC
  Administered 2023-09-08 – 2023-09-09 (×3): 25 mg
  Filled 2023-09-08 (×3): qty 1

## 2023-09-08 MED ORDER — SODIUM CHLORIDE 0.9% FLUSH
10.0000 mL | INTRAVENOUS | Status: DC | PRN
  Administered 2023-09-16: 10 mL

## 2023-09-08 MED ORDER — ALPRAZOLAM 0.5 MG PO TABS
0.2500 mg | ORAL_TABLET | Freq: Two times a day (BID) | ORAL | Status: DC
  Administered 2023-09-08 – 2023-09-09 (×3): 0.25 mg
  Filled 2023-09-08 (×3): qty 1

## 2023-09-08 MED ORDER — ASPIRIN 81 MG PO CHEW
81.0000 mg | CHEWABLE_TABLET | Freq: Every day | ORAL | Status: DC
  Administered 2023-09-08 – 2023-09-10 (×3): 81 mg
  Filled 2023-09-08 (×3): qty 1

## 2023-09-08 MED ORDER — HEPARIN (PORCINE) 25000 UT/250ML-% IV SOLN
900.0000 [IU]/h | INTRAVENOUS | Status: DC
  Administered 2023-09-08: 600 [IU]/h via INTRAVENOUS
  Administered 2023-09-10: 900 [IU]/h via INTRAVENOUS
  Filled 2023-09-08 (×2): qty 250

## 2023-09-08 MED ORDER — FUROSEMIDE 10 MG/ML IJ SOLN
120.0000 mg | Freq: Once | INTRAVENOUS | Status: DC
  Filled 2023-09-08: qty 12

## 2023-09-08 MED ORDER — OSMOLITE 1.2 CAL PO LIQD
1000.0000 mL | ORAL | Status: DC
  Administered 2023-09-08: 1000 mL
  Filled 2023-09-08 (×3): qty 1000

## 2023-09-08 MED ORDER — ADULT MULTIVITAMIN W/MINERALS CH
1.0000 | ORAL_TABLET | Freq: Every day | ORAL | Status: DC
  Administered 2023-09-08 – 2023-09-10 (×3): 1
  Filled 2023-09-08 (×3): qty 1

## 2023-09-08 MED ORDER — THIAMINE MONONITRATE 100 MG PO TABS
100.0000 mg | ORAL_TABLET | Freq: Every day | ORAL | Status: DC
  Administered 2023-09-08 – 2023-09-10 (×3): 100 mg
  Filled 2023-09-08 (×3): qty 1

## 2023-09-08 MED ORDER — NICOTINE 14 MG/24HR TD PT24
14.0000 mg | MEDICATED_PATCH | Freq: Every day | TRANSDERMAL | Status: DC
  Administered 2023-09-08 – 2023-09-09 (×2): 14 mg via TRANSDERMAL
  Filled 2023-09-08 (×2): qty 1

## 2023-09-08 MED ORDER — FUROSEMIDE 10 MG/ML IJ SOLN
60.0000 mg | Freq: Once | INTRAMUSCULAR | Status: AC
  Administered 2023-09-08: 60 mg via INTRAVENOUS
  Filled 2023-09-08: qty 6

## 2023-09-08 MED ORDER — METOLAZONE 2.5 MG PO TABS: 5.0000 mg | ORAL_TABLET | Freq: Once | ORAL | Status: DC

## 2023-09-08 NOTE — Plan of Care (Signed)
  Problem: Clinical Measurements: Goal: Ability to maintain clinical measurements within normal limits will improve Outcome: Progressing Goal: Respiratory complications will improve Outcome: Progressing Goal: Cardiovascular complication will be avoided Outcome: Progressing   Problem: Nutrition: Goal: Adequate nutrition will be maintained Outcome: Progressing   Problem: Pain Managment: Goal: General experience of comfort will improve and/or be controlled Outcome: Progressing   Problem: Safety: Goal: Ability to remain free from injury will improve Outcome: Progressing

## 2023-09-08 NOTE — Progress Notes (Signed)
 Peripherally Inserted Central Catheter Placement  The IV Nurse has discussed with the patient and/or persons authorized to consent for the patient, the purpose of this procedure and the potential benefits and risks involved with this procedure.  The benefits include less needle sticks, lab draws from the catheter, and the patient may be discharged home with the catheter. Risks include, but not limited to, infection, bleeding, blood clot (thrombus formation), and puncture of an artery; nerve damage and irregular heartbeat and possibility to perform a PICC exchange if needed/ordered by physician.  Alternatives to this procedure were also discussed.  Bard Power PICC patient education guide, fact sheet on infection prevention and patient information card has been provided to patient /or left at bedside.    PICC Placement Documentation  PICC Triple Lumen 09/08/23 Right Brachial 33 cm 0 cm (Active)  Indication for Insertion or Continuance of Line Vasoactive infusions 09/08/23 1245  Exposed Catheter (cm) 0 cm 09/08/23 1245  Site Assessment Clean, Dry, Intact 09/08/23 1245  Lumen #1 Status Flushed;Blood return noted;Saline locked 09/08/23 1245  Lumen #2 Status Flushed;Blood return noted;Saline locked 09/08/23 1245  Lumen #3 Status Flushed;Blood return noted;Saline locked 09/08/23 1245  Dressing Type Transparent 09/08/23 1245  Dressing Status Antimicrobial disc/dressing in place 09/08/23 1245  Line Care Connections checked and tightened 09/08/23 1245  Line Adjustment (NICU/IV Team Only) No 09/08/23 1245  Dressing Intervention New dressing 09/08/23 1245  Dressing Change Due 09/15/23 09/08/23 1245       [name] 09/08/2023, 12:57 PM

## 2023-09-08 NOTE — Progress Notes (Signed)
 Heart Failure Navigator Progress Note  Assessed for Heart & Vascular TOC clinic readiness.  Patient does not meet criteria due to per MD note patient with history of Dementia. No HF TOC. .   Navigator will sign off at this time.   Randie Bustle, BSN, Scientist, clinical (histocompatibility and immunogenetics) Only

## 2023-09-08 NOTE — Progress Notes (Signed)
 40 ml's / 400 mcg's of Fentanyl  wasted in Stericycle. Witness by Parke Boll RN

## 2023-09-08 NOTE — TOC CM/SW Note (Addendum)
 Transition of Care [facility]) - Inpatient Brief Assessment   Patient Details  Name: Carla [name] MRN: [id number] Date of Birth: Jul 29, 1945  Transition of Care [facility]) CM/SW Contact:    Juliane Och, LCSW Phone Number: 09/08/2023, 10:10 AM   Clinical Narrative:  10:10 AM Per chart review, patient resides at home. Patient has insurance but not a PCP. Patient does not have SNF history. Patient has HH history with [facility]. Patient has home oxygen /nebulizer machine through [facility]. Patient is currently intubated. CSW attempted to call patient's spouse, [name], to introduce self and role, as well as conduct TOC initial assessment. There was no response and a voicemail was left. TOC will continue to follow.  Transition of Care Asessment: Insurance and Status: Insurance coverage has been reviewed Patient has primary care physician: No Home environment has been reviewed: Private Residence Prior level of function:: N/A Prior/Current Home Services: No current home services (Has HH/DME history) Social Drivers of Health Review:  (Patient unable to answer) Readmission risk has been reviewed: Yes Transition of care needs: transition of care needs identified, TOC will continue to follow

## 2023-09-08 NOTE — Progress Notes (Signed)
 Initial Nutrition Assessment  DOCUMENTATION CODES:  Underweight, Severe malnutrition in context of chronic illness  INTERVENTION:  Adjust tube feeding via OGT: Osmolite 1.2 at 50 ml/h (1200 ml per day) Start at 40 and advance by 10mL q6h to goal Provides 1440 kcal, 67 gm protein, 984 ml free water  daily Thiamine 100 mg x 5 days, MVI with minerals daily  NUTRITION DIAGNOSIS:  Severe Malnutrition related to chronic illness (COPD) as evidenced by severe muscle depletion, severe fat depletion.  GOAL:  Patient will meet greater than or equal to 90% of their needs  MONITOR:  TF tolerance, I & O's, Vent status, Labs  REASON FOR ASSESSMENT:  Consult Enteral/tube feeding initiation and management  ASSESSMENT:  Pt with hx of dementia, COPD, PVD, GERD, HTN, and HLD presented to ED with breathing difficulty. Found to have a COPD exacerbation.  Pt agitated and unwilling to wear BiPAP at [facility] ED. Required intubation for hypoxia and transferred to [facility].  6/14 - presented to ED 6/15 - intubated, transferred to [facility]  Patient is currently intubated on ventilator support. No family at bedside to provide a hx at this time. On exam, pt very thin with loss of muscle and fat stores. Family reported in triage that pt's had not been eating well for about a week.  Pt discussed during ICU rounds and with RN and MD. TF protocol initiated over the weekend. Will adjust to better meet pt's needs. PMT assisting pt's family with GOC discussions.   MV: 9.8 L/min Temp (24hrs), Avg:99.4 F (37.4 C), Min:98.6 F (37 C), Max:99.9 F (37.7 C) MAP (cuff): 80 mmHg  Propofol : 3.73 ml/hr (98 kcal/d)  Admit weight: 41.4 kg   Current weight: 42.3 kg    Intake/Output Summary (Last 24 hours) at 09/08/2023 1437 Last data filed at 09/08/2023 1100 Gross per 24 hour  Intake 1575.71 ml  Output 370 ml  Net 1205.71 ml  Net IO Since Admission: 1,940.62 mL [09/08/23 1437]  Drains/Lines: OGT 16 Fr.    Nutritionally Relevant Medications: Scheduled Meds:  PROSource TF20  60 mL Per Tube Daily   VITAL HIGH PROTEIN  1,000 mL Per Tube Q24H   insulin  aspart  0-9 Units Subcutaneous Q4H   methylPREDNISolone  (SOLU-MEDROL ) injection  40 mg Intravenous Q24H   Continuous Infusions:  azithromycin  Stopped (09/07/23 1930)   cefTRIAXone  (ROCEPHIN )  IV Stopped (09/07/23 1821)   famotidine  (PEPCID ) IV 10 mg (09/08/23 0927)   norepinephrine  (LEVOPHED ) Adult infusion 2 mcg/min (09/08/23 0700)   propofol  (DIPRIVAN ) infusion 15 mcg/kg/min (09/08/23 0700)   PRN Meds: docusate, polyethylene glycol  Labs Reviewed: BUN 40, creatinine 1.51 Magnesium  2.6 CBG ranges from 142-223 mg/dL over the last 24 hours HgbA1c 5.9% (6/15)  NUTRITION - FOCUSED PHYSICAL EXAM: Flowsheet Row Most Recent Value  Orbital Region Severe depletion  Upper Arm Region Severe depletion  Thoracic and Lumbar Region Severe depletion  Buccal Region Severe depletion  Temple Region Severe depletion  Clavicle Bone Region Severe depletion  Clavicle and Acromion Bone Region Severe depletion  Scapular Bone Region Severe depletion  Dorsal Hand Unable to assess  [mittens]  Patellar Region Severe depletion  Anterior Thigh Region Severe depletion  Posterior Calf Region Severe depletion  Edema (RD Assessment) None  Hair Reviewed  Eyes Reviewed  Mouth Reviewed  Skin Reviewed  Nails Unable to assess  [mittens]    Diet Order:   Diet Order             Diet NPO time specified  Diet effective now                   EDUCATION NEEDS:  Not appropriate for education at this time  Skin:  Skin Assessment: Reviewed RN Assessment  Last BM:  unsure  Height:  Ht Readings from Last 1 Encounters:  09/07/23 5' (1.524 m)    Weight:  Wt Readings from Last 1 Encounters:  09/08/23 42.3 kg    Ideal Body Weight:  45.5 kg  BMI:  Body mass index is 18.21 kg/m.  Estimated Nutritional Needs:  Kcal:  1300-1500 kcal/d Protein:   65-80g/d Fluid:  1.5L/d    Edwena Graham, RD, LDN Registered Dietitian II Please reach out via secure chat

## 2023-09-08 NOTE — Progress Notes (Signed)
   Palliative Medicine Inpatient Follow Up Note HPI: 78 y/o female with PMH for COPD/Emphysema, HTN, Dementia (AAOx1 at baseline), Anxiety, Depression, GERD, Primary hyperparathyroidism, Vit D Def, HLD, Osteoporosis, and prior tracheostomy in '24. She was BIB EMS to [facility] for difficulty breathing, has subsequently been intubated.    Palliative care has seen Carla [name] in the past in April of '24 and March of '25. We have been consulted during this hospitalization for goals of care conversations.    Today's Discussion 09/08/2023  *Please note that this is a verbal dictation therefore any spelling or grammatical errors are due to the Dragon Medical One system interpretation.  Chart reviewed inclusive of vital signs, progress notes, laboratory results, and diagnostic images.   I met with Carla [name]'s RN at bedside this morning. We discussed patients current status and how she is quite strong and seems to get agitated when sedation is weaned. Patient not at this time following commands for nursing.   Carla [name] is resting calmly when I was  at bedside. She remains intubated and sedated. ________________  I was able to call patients spouse later this morning. Created space and opportunity for patients spouse to explore thoughts feelings and fears regarding current medical situation.  We discussed that Carla [name] is relying on the experts to help guide him regarding the decisions he makes for his wife.   We talked further about CPR - primarly chest compressions and Carla [name] agrees that by pursuing these we would likely do more harm than good. He shares that he understands no chest compressions and this is what he'd like to pursue. He agree's to continue with full scope of care.   Dough remains hopeful for improvements.   Questions and concerns addressed/Palliative Support Provided.   Objective Assessment: Vital Signs Vitals:   09/08/23 0715 09/08/23 0731  BP: (!) 105/53   Pulse: (!) 47 (!) 55  Resp: (!) 26    Temp: 99.1 F (37.3 C)   SpO2: 98%     Intake/Output Summary (Last 24 hours) at 09/08/2023 1610 Last data filed at 09/08/2023 0700 Gross per 24 hour  Intake 1628.24 ml  Output 400 ml  Net 1228.24 ml   Last Weight  Most recent update: 09/08/2023  5:42 AM    Weight  42.3 kg (93 lb 4.1 oz)            Gen:  Elderly Caucasian F chronically ill appearing HEENT: ETT, OGT, dry mucous membranes CV: Regular rate and rhythm  PULM: On mechanical ventilator ABD: soft/nontender  EXT: No edema Neuro: Sedated  SUMMARY OF RECOMMENDATIONS   DNR with interventions / Full scope of care --> No chest compressions   Open and honest conversations held in the setting of patients acute on chronic disease burden   Goals of patients family are to continue present care with the hope of improvement  Appreciate CCM updated patients spouse when able   Ongoing PMT support ______________________________________________________________________________________ Camille C[facility] Palliative Medicine Team Team Cell Phone: [phone] Please utilize secure chat with additional questions, if there is no response within 30 minutes please call the above phone number  MDM: High Time: 42  Palliative Medicine Team providers are available by phone from 7am to 7pm daily and can be reached through the team cell phone.  Should this patient require assistance outside of these hours, please call the patient's attending physician.

## 2023-09-08 NOTE — Progress Notes (Signed)
 NAME:  Carla [name], MRN:  [id number], DOB:  15-May-1945, LOS: 2 ADMISSION DATE:  09/06/2023, CONSULTATION DATE:  09/07/2023 REFERRING MD:  Gwenetta Lennert, MD, CHIEF COMPLAINT:  SOB  History of Present Illness:  78 y/o female with PMH for COPD/Emphysema, HTN, Dementia (AAOx1 at baseline), Anxiety, Depression, GERD, Primary hyperparathyroidism, Vit D Def, HLD, Osteoporosis, who was BIB EMS to [facility] for difficulty breathing, Per EMS RA sats 70% and placed on 15L sats 96%, per EMS and family she has not been eating well x 1 week. In ED BiPAP was tried but failed and given her confusion she was not keeping the mask on and high flow also did not work.  She was subsequently intubated.  She was admitted for COPD exacerbation and she became hypotensive requiring Levophed .  Family wants full code per documentation at [facility]. PH 7.26 PCO2 73 on VBG, BNP >4500, WBC 13, Cr 1.3, cxr showing left basilar infiltrate.  Pertinent  Medical History   Anxiety, ASCVD (arteriosclerotic cardiovascular disease), Atrophic vaginitis (04/18/2016), Back pain, Cervical disc herniation, Chronic bronchitis, Colon polyps, De Quervain's disease (radial styloid tenosynovitis) (10/14/2016), Dementia (HCC) (06/10/2020), Depression, Elevated hemoglobin A1c, Fasting hyperglycemia, GERD (gastroesophageal reflux disease), Heart disease, HSV (herpes simplex virus) infection, Hydronephrosis, Hypercalcemia (12/30/2008), Hypercholesteremia, Hypertension, Neck muscle spasm (04/23/2014), Onychomycosis (07/08/2015), Osteoporosis, Peripheral vascular disease (HCC), Secondary erythrocytosis (08/05/2012), Tobacco abuse, and 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  Significant Hospital Events: Including procedures, antibiotic start and stop dates in addition to other pertinent events   6/15: Transfer from [facility] to [facility] ICU admit, intubated on Levophed   Interim History / Subjective:  Remains easily agitated on low dose propofol  and fentanyl   Afebrile Echo with  new HFrEF 35-40% Remains on NE 2  Objective    Blood pressure (!) 105/53, pulse (!) 55, temperature 99.1 F (37.3 C), resp. rate (!) 26, height 5' (1.524 m), weight 42.3 kg, SpO2 98%.    Vent Mode: PRVC FiO2 (%):  [40 %] 40 % Set Rate:  [26 bmp] 26 bmp Vt Set:  [360 mL] 360 mL PEEP:  [5 cmH20] 5 cmH20 Pressure Support:  [5 cmH20] 5 cmH20 Plateau Pressure:  [15 cmH20-17 cmH20] 17 cmH20   Intake/Output Summary (Last 24 hours) at 09/08/2023 1032 Last data filed at 09/08/2023 0700 Gross per 24 hour  Intake 1588.46 ml  Output 400 ml  Net 1188.46 ml   Filed Weights   09/07/23 0126 09/08/23 0500  Weight: 41.4 kg 42.3 kg    Examination: Propofol  15, fent 50 General:  AoC thin older female sedated on MV HEENT: MM pink/moist, R 3/nr, L 2/r, ETT/ OGT, JVP up to jaw Neuro: attempts to open eyes, not f/c, w/d to noxious stimuli, intermittent agitated/ UE tremors  CV: rr, SB, no murmur, +2 pulses PULM:  MV supported, clear with left basilar rales, no wheeze, Pplat 15, dp 10, apneic on SBT GI: thin, +bs, NT, foley- cyu Extremities: warm/dry, no tibial edema  Skin: no rashes   UOP 457ml/ 24hrs Net +1.7 ml  Labs> K 4.3, BUN/ sCr 18/ 1.36> 40/ 1.51  6/14 Bcx2> ngtd x2 6/15 RVP > neg 6/15 MRSA PCR > neg 6/14 SARs/ flu/ RSV> neg trach asp> not sent> will send today 6/16   Assessment and Plan  Acute on chronic hypoxic and hypercarbic respiratory failure 2/2 left CAP, left pleural effusion and AECOPD - MRSA PCR, RVP, SARS/ flu/ RSV, and strep ua neg P:  - cont full MV support, 8cc/kg IBW with goal Pplat <30  and DP<15  - VAP prevention protocol/ PPI - PAD protocol for sedation> minimize fentanyl  to hopeful pushes, would like to change over from propofol  to precedex  but bradycardia limits that currently.  RASS goal 0/-1 - recheck VBG> likely can decrease RR from 26 - wean FiO2 as able for SpO2 >92%  - daily SAT & SBT > apneic on SBT - resend trach asp> never sent - cont abx as  below - cont triple therapy nebs and   - cont daily solumedrol for 5 days of therapy - diureses for left effusion  - CXR in am     Shock- septic, cardiogenic, and sedation needs could be contributing Lactic acidosis, resolving  - improving peripheral pressors requirement but ongoing requirement, will get PICC - goal MAP > 65 - trend CVP, coox - cont to follow cultures, trach asp never sent> will resend  - cont azithro/ ceftriaxone   - recheck PCT, initial 0.11 - trend WBC/ fever curve    New HFrEF NSTEMI Elevated BNP - 6/15 TTE > LVEF 35-40%, G2DD, +regional wall motion abnormalities (apical lateral segment, mid anterolateral segment, mid inferoseptal segment, apical septal segment, and apex are akinetic, RV mildly reduced, RVSP 37.8, small apex and circumferential pericardial effusion, noted left pleural effusion, mild aortic regurgitation, suggestive RA 15.  Previous EF 55-60% in March 2025 P:  - check coox/ trend CVP - heparin  per pharmacy empirically - ASA - cont to diurese as renally/ hemodynamically tolerated  - will treat from medical standpoint for now as not currently stable for invasive testing.  Will need cardiology consult after liberation from vent for further recs and evaluation - optimize electrolytes  AKI - minimal response to lasix  yest with increased BUN/ sCr.  Will check FEUr.  Consider renal US  if  worsening renal function - trend renal indices  - strict I/Os, daily wts - avoid nephrotoxins, renal dose meds, hemodynamic support as above   Hyperglycemia, 2/2 reactive/ steroids  - A1c 5.9 - cont CBG q4/ SSI prn, currently within goal   Dementia - Aox1 at baseline - cont supportive care.  Apparently not taking home meds> will restart pta low dose xanax  and lower dose seroquel  to minimize continuous meds  At risk for malnutrition - EN per RD  Tobacco abuse - nicotine  patch, smoking cessation when appropriate   GOC - appreciate PMT assistance.   Changed to limited code- no CPR 6/16.  Overall prognosis remains poor given multi co-morbidities, recurrent resp failure and now new HFrEF.    Best Practice (right click and Reselect all SmartList Selections daily)   Diet/type: NPO; TF  DVT prophylaxis systemic heparin  Pressure ulcer(s): N/A GI prophylaxis: H2B Lines: N/A Foley:  Yes, and it is still needed Code Status:  limited  Pending, called> went to VM, message left 6/16   CRITICAL CARE Performed by: Early Glisson   Total critical care time: 38 mins minutes     Early Glisson, MSN, AG-ACNP-BC [facility] 09/08/2023, 10:32 AM  See Amion for pager If no response to pager , please call [phone] until 7pm After 7:00 pm call Elink  [phone]

## 2023-09-08 NOTE — Progress Notes (Addendum)
 PHARMACY - ANTICOAGULATION CONSULT NOTE  Pharmacy Consult for heparin   Indication: chest pain/ACS  Allergies  Allergen Reactions   Aricept  [Donepezil  Hcl] Diarrhea   Calcium -Containing Compounds Other (See Comments)    Pt has hyperparathyroidism which results in hypercalcemia   Exelon  [Rivastigmine ] Rash    Patient Measurements: Height: 5' (152.4 cm) Weight: 42.3 kg (93 lb 4.1 oz) IBW/kg (Calculated) : 45.5 Heparin  dosing weight: 42.3 kg   Vital Signs: Temp: 99.1 F (37.3 C) (06/16 1100) BP: 148/53 (06/16 1100) Pulse Rate: 57 (06/16 1129)  Labs: Recent Labs    09/06/23 2220 09/06/23 2226 09/06/23 2226 09/07/23 0013 09/07/23 0452 09/08/23 0259  HGB  --  14.4   < >  --  14.1 12.9  HCT  --  46.1*  --   --  45.8 42.2  PLT  --  412*  --   --  323 228  LABPROT  --  12.9  --   --   --   --   INR  --  1.0  --   --   --   --   CREATININE  --  1.13*  --   --  1.36* 1.51*  TROPONINIHS 308*  --   --  246*  --   --    < > = values in this interval not displayed.    Estimated Creatinine Clearance: 20.5 mL/min (A) (by C-G formula based on SCr of 1.51 mg/dL (H)).   Medical History: Past Medical History:  Diagnosis Date   Anxiety    ASCVD (arteriosclerotic cardiovascular disease)    No critical disease on cath in 8/97: mild AI with trival, if any l AS; negative stress nuclear in 2010   Atrophic vaginitis 04/18/2016   Back pain    Cervical disc herniation    Chronic bronchitis    Colon polyps    De Quervain's disease (radial styloid tenosynovitis) 10/14/2016   Dementia (HCC) 06/10/2020   Depression    Elevated hemoglobin A1c    Fasting hyperglycemia    GERD (gastroesophageal reflux disease)    Heart disease    HSV (herpes simplex virus) infection    Hydronephrosis    Hypercalcemia 12/30/2008   Qualifier: Diagnosis of  By: Gardenia Jump LPN, Brandi     Hypercholesteremia    Hypertension    Neck muscle spasm 04/23/2014   Onychomycosis 07/08/2015   Osteoporosis    Peripheral  vascular disease (HCC)    stauts post aortabifemoral graft     Secondary erythrocytosis 08/05/2012   Secondary to COPD   Tobacco abuse    has stopped   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 Echo showing motion wall changes and new reduced EF. HgB 12.9 and PLTs 228. Pt not on anticoagulation PTA. Pharmacy consulted to dose heparin  gtt. Planning to treat medically and consult cardiology after MV.   Goal of Therapy:  Heparin  level 0.3-0.7 units/ml Monitor platelets by anticoagulation protocol: Yes   Plan:  No bolus with recent heparin  subcutaneous and low body weight + AKI, high risk for bleeding.  Start heparin  infusion at 600 units/hr Check anti-Xa level in 8 hours and daily while on heparin  Continue to monitor H&H and platelets D/c heparin  subcutaneous.   Mamie Searles, PharmD, BCCCP  09/08/2023,2:42 PM

## 2023-09-08 NOTE — TOC Progression Note (Signed)
 Transition of Care [facility]) - Progression Note    Patient Details  Name: Carla [name] MRN: [id number] Date of Birth: 1946/03/22  [facility] CM/SW Contact  Juliane Och, LCSW Phone Number: 09/08/2023, 12:03 PM  Clinical Narrative:     12:03 PM CSW spoke with patient's spouse, [name]. [name] confirmed that him and patient reside together with their adult son, [name]. [name] confirmed him and mark could provide transportation and home support if needed upon discharge. Patient confirmed patient does not have SNF history. Patient confirmed HH/DME history ([facility], [facility] home oxygen ). [name] confirmed his and Herbalist.  Expected Discharge Plan: Home/Self Care Barriers to Discharge: Continued Medical Work up  Expected Discharge Plan and Services In-house Referral: Clinical Social Work     Living arrangements for the past 2 months: Single Family Home                                       Social Determinants of Health (SDOH) Interventions SDOH Screenings   Food Insecurity: Patient Unable To Answer (05/30/2023)  Housing: Patient Unable To Answer (05/30/2023)  Transportation Needs: Patient Unable To Answer (05/30/2023)  Utilities: Patient Unable To Answer (05/30/2023)  Alcohol Screen: Low Risk  (02/24/2020)  Depression (PHQ2-9): Low Risk  (04/05/2020)  Financial Resource Strain: Medium Risk (02/24/2020)  Physical Activity: Insufficiently Active (02/24/2020)  Social Connections: Patient Unable To Answer (05/30/2023)  Stress: No Stress Concern Present (02/24/2020)  Tobacco Use: High Risk (06/14/2023)    Readmission Risk Interventions    06/05/2023   11:03 AM 07/22/2022    1:43 PM  Readmission Risk Prevention Plan  Transportation Screening Complete Complete  HRI or Home Care Consult Complete   Social Work Consult for Recovery Care Planning/Counseling Complete   Palliative Care Screening Complete Complete  Medication Review Oceanographer)  Complete

## 2023-09-09 DIAGNOSIS — J441 Chronic obstructive pulmonary disease with (acute) exacerbation: Secondary | ICD-10-CM | POA: Diagnosis not present

## 2023-09-09 DIAGNOSIS — J90 Pleural effusion, not elsewhere classified: Secondary | ICD-10-CM | POA: Diagnosis not present

## 2023-09-09 DIAGNOSIS — J9622 Acute and chronic respiratory failure with hypercapnia: Secondary | ICD-10-CM | POA: Diagnosis not present

## 2023-09-09 DIAGNOSIS — J9621 Acute and chronic respiratory failure with hypoxia: Secondary | ICD-10-CM | POA: Diagnosis not present

## 2023-09-09 DIAGNOSIS — Z7189 Other specified counseling: Secondary | ICD-10-CM | POA: Diagnosis not present

## 2023-09-09 DIAGNOSIS — Z515 Encounter for palliative care: Secondary | ICD-10-CM | POA: Diagnosis not present

## 2023-09-09 LAB — BASIC METABOLIC PANEL WITH GFR
Anion gap: 7 (ref 5–15)
BUN: 52 mg/dL — ABNORMAL HIGH (ref 8–23)
CO2: 29 mmol/L (ref 22–32)
Calcium: 8.2 mg/dL — ABNORMAL LOW (ref 8.9–10.3)
Chloride: 102 mmol/L (ref 98–111)
Creatinine, Ser: 1.2 mg/dL — ABNORMAL HIGH (ref 0.44–1.00)
GFR, Estimated: 46 mL/min — ABNORMAL LOW (ref >60)
Glucose, Bld: 173 mg/dL — ABNORMAL HIGH (ref 70–99)
Potassium: 3.8 mmol/L (ref 3.5–5.1)
Sodium: 138 mmol/L (ref 135–145)

## 2023-09-09 LAB — CBC
HCT: 35.7 % — ABNORMAL LOW (ref 36.0–46.0)
Hemoglobin: 11 g/dL — ABNORMAL LOW (ref 12.0–15.0)
MCH: 29.1 pg (ref 26.0–34.0)
MCHC: 30.8 g/dL (ref 30.0–36.0)
MCV: 94.4 fL (ref 80.0–100.0)
Platelets: 265 10*3/uL (ref 150–400)
RBC: 3.78 MIL/uL — ABNORMAL LOW (ref 3.87–5.11)
RDW: 15.1 % (ref 11.5–15.5)
WBC: 14.6 10*3/uL — ABNORMAL HIGH (ref 4.0–10.5)
nRBC: 0 % (ref 0.0–0.2)

## 2023-09-09 LAB — HEPARIN LEVEL (UNFRACTIONATED)
Heparin Unfractionated: 0.25 [IU]/mL — ABNORMAL LOW (ref 0.30–0.70)
Heparin Unfractionated: 0.21 [IU]/mL — ABNORMAL LOW (ref 0.30–0.70)
Heparin Unfractionated: 0.24 [IU]/mL — ABNORMAL LOW (ref 0.30–0.70)

## 2023-09-09 LAB — POCT I-STAT EG7
Acid-Base Excess: 6 mmol/L — ABNORMAL HIGH (ref 0.0–2.0)
Bicarbonate: 32.9 mmol/L — ABNORMAL HIGH (ref 20.0–28.0)
Calcium, Ion: 1.28 mmol/L (ref 1.15–1.40)
HCT: 36 % (ref 36.0–46.0)
Hemoglobin: 12.2 g/dL (ref 12.0–15.0)
O2 Saturation: 76 %
Patient temperature: 96.6
Potassium: 4.9 mmol/L (ref 3.5–5.1)
Sodium: 140 mmol/L (ref 135–145)
TCO2: 35 mmol/L — ABNORMAL HIGH (ref 22–32)
pCO2, Ven: 55.2 mmHg (ref 44–60)
pH, Ven: 7.378 (ref 7.25–7.43)
pO2, Ven: 40 mmHg (ref 32–45)

## 2023-09-09 LAB — GLUCOSE, CAPILLARY
Glucose-Capillary: 100 mg/dL — ABNORMAL HIGH (ref 70–99)
Glucose-Capillary: 113 mg/dL — ABNORMAL HIGH (ref 70–99)
Glucose-Capillary: 142 mg/dL — ABNORMAL HIGH (ref 70–99)
Glucose-Capillary: 148 mg/dL — ABNORMAL HIGH (ref 70–99)
Glucose-Capillary: 186 mg/dL — ABNORMAL HIGH (ref 70–99)
Glucose-Capillary: 223 mg/dL — ABNORMAL HIGH (ref 70–99)

## 2023-09-09 LAB — UREA NITROGEN, URINE: Urea Nitrogen, Ur: 1224 mg/dL

## 2023-09-09 LAB — LEGIONELLA PNEUMOPHILA SEROGP 1 UR AG: L. pneumophila Serogp 1 Ur Ag: NEGATIVE

## 2023-09-09 LAB — PROCALCITONIN: Procalcitonin: 0.1 ng/mL

## 2023-09-09 MED ORDER — ALPRAZOLAM 0.5 MG PO TABS: 0.2500 mg | ORAL_TABLET | Freq: Two times a day (BID) | ORAL | Status: DC | PRN

## 2023-09-09 MED ORDER — POTASSIUM CHLORIDE 20 MEQ PO PACK
20.0000 meq | PACK | Freq: Once | ORAL | Status: AC
  Administered 2023-09-09: 20 meq
  Filled 2023-09-09: qty 1

## 2023-09-09 NOTE — Progress Notes (Signed)
 PHARMACY - ANTICOAGULATION CONSULT NOTE  Pharmacy Consult for heparin   Indication: chest pain/ACS  Allergies  Allergen Reactions   Aricept  [Donepezil  Hcl] Diarrhea   Calcium -Containing Compounds Other (See Comments)    Pt has hyperparathyroidism which results in hypercalcemia   Exelon  [Rivastigmine ] Rash    Patient Measurements: Height: 5' (152.4 cm) Weight: 43.3 kg (95 lb 7.4 oz) IBW/kg (Calculated) : 45.5 HEPARIN  DW (KG): 42.3 Heparin  dosing weight: 42.3 kg   Vital Signs: Temp: 97.7 F (36.5 C) (06/17 0945) BP: 106/69 (06/17 0945) Pulse Rate: 63 (06/17 0945)  Labs: Recent Labs    09/06/23 2220 09/06/23 2226 09/06/23 2226 09/07/23 0013 09/07/23 0452 09/08/23 0259 09/09/23 0053 09/09/23 0332  HGB  --  14.4   < >  --  14.1 12.9  --  11.0*  HCT  --  46.1*   < >  --  45.8 42.2  --  35.7*  PLT  --  412*   < >  --  323 228  --  265  LABPROT  --  12.9  --   --   --   --   --   --   INR  --  1.0  --   --   --   --   --   --   HEPARINUNFRC  --   --   --   --   --   --  0.25*  --   CREATININE  --  1.13*   < >  --  1.36* 1.51*  --  1.20*  TROPONINIHS 308*  --   --  246*  --   --   --   --    < > = values in this interval not displayed.    Estimated Creatinine Clearance: 26.4 mL/min (A) (by C-G formula based on SCr of 1.2 mg/dL (H)).   Medical History: Past Medical History:  Diagnosis Date   Anxiety    ASCVD (arteriosclerotic cardiovascular disease)    No critical disease on cath in 8/97: mild AI with trival, if any l AS; negative stress nuclear in 2010   Atrophic vaginitis 04/18/2016   Back pain    Cervical disc herniation    Chronic bronchitis    Colon polyps    De Quervain's disease (radial styloid tenosynovitis) 10/14/2016   Dementia (HCC) 06/10/2020   Depression    Elevated hemoglobin A1c    Fasting hyperglycemia    GERD (gastroesophageal reflux disease)    Heart disease    HSV (herpes simplex virus) infection    Hydronephrosis    Hypercalcemia 12/30/2008    Qualifier: Diagnosis of  By: Gardenia Jump LPN, Brandi     Hypercholesteremia    Hypertension    Neck muscle spasm 04/23/2014   Onychomycosis 07/08/2015   Osteoporosis    Peripheral vascular disease (HCC)    stauts post aortabifemoral graft     Secondary erythrocytosis 08/05/2012   Secondary to COPD   Tobacco abuse    has stopped   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 Echo showing motion wall changes and new reduced EF. HgB 12.9 and PLTs 228. Pt not on anticoagulation PTA. Pharmacy consulted to dose heparin  gtt. Planning to treat medically and consult cardiology after MV.   Heparin  level subtherapeutic at 0.21 while on heparin  700u/hr. HgB 11.0 and PLTs 265. No issues noted.   Goal of Therapy:  Heparin  level 0.3-0.7 units/ml Monitor platelets by anticoagulation protocol: Yes   Plan:  Increase heparin  infusion to 800u/hr.  Check anti-Xa level in 8 hours and daily while on heparin  Continue to monitor H&H and platelets Plan to d/c heparin  gtt tomorrow around noon for a total duration of 48 hours of therapy for NSTEMI.   Mamie Searles, PharmD, BCCCP  09/09/2023,10:45 AM

## 2023-09-09 NOTE — Progress Notes (Signed)
 PHARMACY - ANTICOAGULATION CONSULT NOTE  Pharmacy Consult for heparin   Indication: chest pain/ACS  Allergies  Allergen Reactions   Aricept  [Donepezil  Hcl] Diarrhea   Calcium -Containing Compounds Other (See Comments)    Pt has hyperparathyroidism which results in hypercalcemia   Exelon  [Rivastigmine ] Rash    Patient Measurements: Height: 5' (152.4 cm) Weight: 43.3 kg (95 lb 7.4 oz) IBW/kg (Calculated) : 45.5 HEPARIN  DW (KG): 42.3 Heparin  dosing weight: 42.3 kg   Vital Signs: Temp: 97 F (36.1 C) (06/17 1800) BP: 120/61 (06/17 1800) Pulse Rate: 77 (06/17 1800)  Labs: Recent Labs    09/06/23 2226 09/07/23 0013 09/07/23 0452 09/08/23 0259 09/09/23 0053 09/09/23 0332 09/09/23 1202 09/09/23 1508 09/09/23 2150  HGB 14.4  --  14.1 12.9  --  11.0*  --  12.2  --   HCT 46.1*  --  45.8 42.2  --  35.7*  --  36.0  --   PLT 412*  --  323 228  --  265  --   --   --   LABPROT 12.9  --   --   --   --   --   --   --   --   INR 1.0  --   --   --   --   --   --   --   --   HEPARINUNFRC  --   --   --   --  0.25*  --  0.21*  --  0.24*  CREATININE 1.13*  --  1.36* 1.51*  --  1.20*  --   --   --   TROPONINIHS  --  246*  --   --   --   --   --   --   --     Estimated Creatinine Clearance: 26.4 mL/min (A) (by C-G formula based on SCr of 1.2 mg/dL (H)).   Assessment: Patient admitted with suspected COPD exacerbation, trop elevated  on admission to 308 down to 246. Echo showing motion wall changes and new reduced EF. HgB 12.9 and PLTs 228. Pt not on anticoagulation PTA. Pharmacy consulted to dose heparin  gtt. Planning to treat medically and consult cardiology after MV.   Heparin  level remains subtherapeutic at 0.24 on infusion at 800 units/hr. No issues with line or bleeding reported per RN.   Goal of Therapy:  Heparin  level 0.3-0.7 units/ml Monitor platelets by anticoagulation protocol: Yes   Plan:  Increase heparin  infusion to 900 units/hr. Will f/u heparin  level in a.m. Plan to  d/c heparin  gtt tomorrow around noon for a total duration of 48 hours of therapy for NSTEMI.   Enrigue Harvard, PharmD, BCPS Please see amion for complete clinical pharmacist phone list 09/09/2023,10:20 PM

## 2023-09-09 NOTE — Progress Notes (Addendum)
 Tolerating SBT for 1hr on PSV 5/5, will proceed with extubation.    Called husband to update, no answer and did not go to VM    Early Glisson, MSN, AG-ACNP-BC [facility] 09/09/2023, 11:10 AM  See Amion for pager If no response to pager , please call [phone] until 7pm After 7:00 pm call Elink  [phone]

## 2023-09-09 NOTE — Progress Notes (Signed)
 NAME:  Carla [name], MRN:  [id number], DOB:  1945-06-20, LOS: 3 ADMISSION DATE:  09/06/2023, CONSULTATION DATE:  09/07/2023 REFERRING MD:  Gwenetta Lennert, MD, CHIEF COMPLAINT:  SOB  History of Present Illness:  78 y/o female with PMH for COPD/Emphysema (prior trach, self de-cannulated 06/2022), HTN, Dementia (AAOx1 at baseline), Anxiety, Depression, GERD, Primary hyperparathyroidism, Vit D Def, HLD, Osteoporosis, who was BIB EMS to [facility] for difficulty breathing, Per EMS RA sats 70% and placed on 15L sats 96%, per EMS and family she has not been eating well x 1 week. In ED BiPAP was tried but failed and given her confusion she was not keeping the mask on and high flow also did not work.  She was subsequently intubated.  She was admitted for COPD exacerbation and she became hypotensive requiring Levophed .  Family wants full code per documentation at [facility]. PH 7.26 PCO2 73 on VBG, BNP >4500, WBC 13, Cr 1.3, cxr showing left basilar infiltrate.  Pertinent  Medical History   Anxiety, ASCVD (arteriosclerotic cardiovascular disease), Atrophic vaginitis (04/18/2016), Back pain, Cervical disc herniation, Chronic bronchitis, Colon polyps, De Quervain's disease (radial styloid tenosynovitis) (10/14/2016), Dementia (HCC) (06/10/2020), Depression, Elevated hemoglobin A1c, Fasting hyperglycemia, GERD (gastroesophageal reflux disease), Heart disease, HSV (herpes simplex virus) infection, Hydronephrosis, Hypercalcemia (12/30/2008), Hypercholesteremia, Hypertension, Neck muscle spasm (04/23/2014), Onychomycosis (07/08/2015), Osteoporosis, Peripheral vascular disease (HCC), Secondary erythrocytosis (08/05/2012), Tobacco abuse, and 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  Significant Hospital Events: Including procedures, antibiotic start and stop dates in addition to other pertinent events   6/15: Transfer from [facility] to [facility] ICU admit, intubated on Levophed  6/16 PICC, coox   Interim History / Subjective:  Off fent gtt since  yest, ongoing low dose propofol  for agitation Apneic on SBT this am Remains on NE 1-2 mcg CVP 6 Following some commands.    Objective    Blood pressure (!) 105/53, pulse (!) 50, temperature (!) 97.5 F (36.4 C), resp. rate (!) 26, height 5' (1.524 m), weight 43.3 kg, SpO2 100%. CVP:  [0 mmHg-45 mmHg] 0 mmHg  Vent Mode: PRVC FiO2 (%):  [40 %] 40 % Set Rate:  [26 bmp] 26 bmp Vt Set:  [360 mL] 360 mL PEEP:  [5 cmH20] 5 cmH20 Plateau Pressure:  [14 cmH20-15 cmH20] 15 cmH20   Intake/Output Summary (Last 24 hours) at 09/09/2023 0942 Last data filed at 09/09/2023 0600 Gross per 24 hour  Intake 1540.49 ml  Output 1725 ml  Net -184.51 ml   Filed Weights   09/08/23 0500 09/08/23 1400 09/09/23 0500  Weight: 42.3 kg 42.3 kg 43.3 kg   Examination: Off 15 of propofol  @ 1005 General:  AoC thin older female lying in bed in NAD HEENT: MM pink/moist, ETT/ OGT, pupils L> R at baseline, anicteric, JV pulse still nearly to jaw Neuro:  opened eyes once then shook head no on repeat ask, will wiggle toes, MAE spont CV: SB to SR, no murmur PULM:  MV supported, clear, no wheeze, scant light tan secretions, Plat 15, dp 10 GI: soft, bs+, NT, foley- cyu Extremities: warm/dry, no extremity edema  Skin: no rashes   UOP 1.7L/ 24hrs Net +1.6 ml CBG trend improving into goal   Labs> K 3.8, BUN/ sCr 40/ 1.51> 52/ 1.2, repeat PCT 0.1, WBC 24> 14.6, H/H 11/ 35  6/14 Bcx2> ngtd x3 6/15 RVP > neg 6/15 MRSA PCR > neg 6/14 SARs/ flu/ RSV> neg trach asp> not sent   Assessment and Plan  Acute on chronic hypoxic and  hypercarbic respiratory failure 2/2 LLL CAP, small left pleural effusion and AECOPD - MRSA PCR, RVP, SARS/ flu/ RSV, and strep ua neg P:  - hopeful for extubation over next hour, otherwise cont full MV support, 8cc/kg IBW with goal Pplat <30 and DP<15  - VAP prevention protocol/ PPI - PAD protocol for sedation> limit fentanyl  pushes, prn propofol  if needed.  Bradycardia has limited use of  precedex .  Cont enteral low dose seroquel / home xanax .  RASS goal 0/-1 - wean FiO2 as able for SpO2 92%  - resend trach asp> never sent - cont 5 days of CAP treatment- azithro/ ctx - pending legionella - prn CXR  - cont triple therapy nebs  - cont daily solumedrol for 5 days of therapy - daily assessment for diureses> hold today, CVP 6, remains on pressors   Shock- septic 2/2 PNA, possible cardiogenic and sedation needs could be contributing Lactic acidosis, resolving  - coox reassuring 77.8 yesterday - remains on low dose NE for MAP goal > 65 - CVP 6> hold further diureses today - follow cultures - CAP tx as above - PCT remains neg, but WBC decreasing and remains afebrile - if worsening shock> repeat echo given pericardial effusion   New HFrEF EF 35-40% NSTEMI Elevated BNP Small pericardial effusion Suspect chronic PH - 6/15 TTE > LVEF 35-40%, G2DD, +regional wall motion abnormalities (apical lateral segment, mid anterolateral segment, mid inferoseptal segment, apical septal segment, and apex are akinetic, RV mildly reduced, RVSP 37.8, small apex and circumferential pericardial effusion, noted left pleural effusion, mild aortic regurgitation, suggestive RA 15.  Previous EF 55-60% in March 2025 P:  - coox reassuring at 77.8 yest - cont heparin  per pharmacy empirically for 48hrs - ASA - check lipid panel - daily assessment of diureses - optimize electrolytes - cont medical management for now.  Once extubated, will get cardiology consult for further evaluation and treatment recommendations   AKI, improving - delayed response to lasix  but good UOP, sCr slightly better - d/c foley if extubated  - trend renal indices  - strict I/Os, daily wts - avoid nephrotoxins, renal dose meds, hemodynamic support as above   Hyperglycemia, 2/2 reactive/ steroids  - A1c 5.9 - cont CBG q4/ SSI prn, currently improving   Dementia - Aox1 at baseline - cont supportive care.   - delirium  precautions   At risk for malnutrition - EN per RD.  May need SLP post extubation   Tobacco abuse - nicotine  patch, smoking cessation when appropriate    GOC - appreciate PMT assistance.  Changed to limited code- no CPR 6/16.  Overall prognosis remains poor given multi co-morbidities, recurrent resp failure and now new HFrEF.    Best Practice (right click and Reselect all SmartList Selections daily)   Diet/type: NPO; TF > on hold  DVT prophylaxis systemic heparin  Pressure ulcer(s): N/A GI prophylaxis: H2B Lines: N/A Foley:  Yes, and it is still needed> likely d/c today Code Status:  limited- no CPR   Pending, 6/17   CRITICAL CARE Performed by: Early Glisson   Total critical care time: 34 mins minutes     Early Glisson, MSN, AG-ACNP-BC [facility] 09/09/2023, 9:42 AM  See Amion for pager If no response to pager , please call [phone] until 7pm After 7:00 pm call Elink  [phone]

## 2023-09-09 NOTE — Progress Notes (Signed)
 PHARMACY - ANTICOAGULATION Pharmacy Consult for heparin   Indication: chest pain/ACS Brief A/P: Heparin  level subtherapeutic. Increase Heparin  rate  Allergies  Allergen Reactions   Aricept  [Donepezil  Hcl] Diarrhea   Calcium -Containing Compounds Other (See Comments)    Pt has hyperparathyroidism which results in hypercalcemia   Exelon  [Rivastigmine ] Rash    Patient Measurements: Height: 5' (152.4 cm) Weight: 42.3 kg (93 lb 4.1 oz) IBW/kg (Calculated) : 45.5 HEPARIN  DW (KG): 42.3 Heparin  dosing weight: 42.3 kg   Vital Signs: Temp: 98.2 F (36.8 C) (06/16 2200) BP: 117/64 (06/16 2344) Pulse Rate: 62 (06/16 2200)  Labs: Recent Labs    09/06/23 2220 09/06/23 2226 09/06/23 2226 09/07/23 0013 09/07/23 0452 09/08/23 0259 09/09/23 0053  HGB  --  14.4   < >  --  14.1 12.9  --   HCT  --  46.1*  --   --  45.8 42.2  --   PLT  --  412*  --   --  323 228  --   LABPROT  --  12.9  --   --   --   --   --   INR  --  1.0  --   --   --   --   --   HEPARINUNFRC  --   --   --   --   --   --  0.25*  CREATININE  --  1.13*  --   --  1.36* 1.51*  --   TROPONINIHS 308*  --   --  246*  --   --   --    < > = values in this interval not displayed.    Estimated Creatinine Clearance: 20.5 mL/min (A) (by C-G formula based on SCr of 1.51 mg/dL (H)).  Assessment: 78 y.o. female with NSTEMI for heparin   Goal of Therapy:  Heparin  level 0.3-0.7 units/ml Monitor platelets by anticoagulation protocol: Yes   Plan:  Increase Heparin  700 units/hr Check heparin  level in 8 hours.   Claudine Cullens, PharmD, BCPS  09/09/2023,2:02 AM

## 2023-09-09 NOTE — Progress Notes (Signed)
   Palliative Medicine Inpatient Follow Up Note HPI: 78 y/o female with PMH for COPD/Emphysema, HTN, Dementia (AAOx1 at baseline), Anxiety, Depression, GERD, Primary hyperparathyroidism, Vit D Def, HLD, Osteoporosis, and prior tracheostomy in '24. She was BIB EMS to [facility] for difficulty breathing, has subsequently been intubated.    Palliative care has seen Carla [name] in the past in April of '24 and March of '25. We have been consulted during this hospitalization for goals of care conversations.    Today's Discussion 09/09/2023  *Please note that this is a verbal dictation therefore any spelling or grammatical errors are due to the Dragon Medical One system interpretation.  Chart reviewed inclusive of vital signs, progress notes, laboratory results, and diagnostic images.   I met with Carla [name]'s RN, Bearl Limes this morning. He shares that Carla [name] has been less alert and not following directions. He shares that sedation is being weaned.   I assessed Carla [name] at bedside and she did open her eyes and respond fairly well. She was able to follow some basic commands for me such as wiggle your toes and lift your arms. She generally appears to not be in distress.  Per discussion with the CCM team, Carla [name] has been following commands. They feel she is stable to extubate.   I have called patients spouse, Carla [name] though he did not answer therefore a HIPAA compliant VM was left.    Questions and concerns addressed/Palliative Support Provided.   Objective Assessment: Vital Signs Vitals:   09/09/23 0930 09/09/23 0945  BP: (!) 94/50 106/69  Pulse: (!) 54 63  Resp: (!) 26 (!) 28  Temp: 97.7 F (36.5 C) 97.7 F (36.5 C)  SpO2: 98% 99%    Intake/Output Summary (Last 24 hours) at 09/09/2023 1127 Last data filed at 09/09/2023 0600 Gross per 24 hour  Intake 1428.1 ml  Output 1725 ml  Net -296.9 ml   Last Weight  Most recent update: 09/09/2023  5:18 AM    Weight  43.3 kg (95 lb 7.4 oz)            Gen:  Elderly  Caucasian F chronically ill appearing HEENT: ETT, OGT, dry mucous membranes CV: Regular rate and rhythm  PULM: On mechanical ventilator ABD: soft/nontender  EXT: No edema Neuro: Sedated  SUMMARY OF RECOMMENDATIONS   DNR with interventions / Full scope of care --> No chest compressions   Open and honest conversations held in the setting of patients acute on chronic disease burden   Goals of patients family are to continue present care with the hope of improvement  Appreciate CCM updated patients spouse when able   Ongoing PMT support ______________________________________________________________________________________ [facility] Team Team Cell Phone: [phone] Please utilize secure chat with additional questions, if there is no response within 30 minutes please call the above phone number  MDM: High Time: 37  Palliative Medicine Team providers are available by phone from 7am to 7pm daily and can be reached through the team cell phone.  Should this patient require assistance outside of these hours, please call the patient's attending physician.

## 2023-09-09 NOTE — Procedures (Signed)
 Extubation Procedure Note  Patient Details:   Name: Carla [name] DOB: 06-02-1945 MRN: [id number]   Airway Documentation:    Vent end date: 09/09/23 Vent end time: 1134   Evaluation  O2 sats: stable throughout Complications: No apparent complications Patient did tolerate procedure well. Bilateral Breath Sounds: Diminished, Clear   No  Per CCM order RT extubated pt to [place]. Prior to extubation pt had + cuff leak. Pt tol well.  Alliyah Roesler  [place] 09/09/2023, 11:36 AM

## 2023-09-10 DIAGNOSIS — J9621 Acute and chronic respiratory failure with hypoxia: Secondary | ICD-10-CM | POA: Diagnosis not present

## 2023-09-10 DIAGNOSIS — J9691 Respiratory failure, unspecified with hypoxia: Secondary | ICD-10-CM

## 2023-09-10 DIAGNOSIS — I5021 Acute systolic (congestive) heart failure: Secondary | ICD-10-CM

## 2023-09-10 DIAGNOSIS — J9622 Acute and chronic respiratory failure with hypercapnia: Secondary | ICD-10-CM | POA: Diagnosis not present

## 2023-09-10 DIAGNOSIS — J189 Pneumonia, unspecified organism: Secondary | ICD-10-CM | POA: Diagnosis not present

## 2023-09-10 DIAGNOSIS — Z515 Encounter for palliative care: Secondary | ICD-10-CM | POA: Diagnosis not present

## 2023-09-10 DIAGNOSIS — J90 Pleural effusion, not elsewhere classified: Secondary | ICD-10-CM | POA: Diagnosis not present

## 2023-09-10 DIAGNOSIS — I214 Non-ST elevation (NSTEMI) myocardial infarction: Secondary | ICD-10-CM | POA: Diagnosis not present

## 2023-09-10 DIAGNOSIS — Z7189 Other specified counseling: Secondary | ICD-10-CM | POA: Diagnosis not present

## 2023-09-10 DIAGNOSIS — J441 Chronic obstructive pulmonary disease with (acute) exacerbation: Secondary | ICD-10-CM | POA: Diagnosis not present

## 2023-09-10 LAB — CBC
HCT: 39.1 % (ref 36.0–46.0)
Hemoglobin: 11.9 g/dL — ABNORMAL LOW (ref 12.0–15.0)
MCH: 29.3 pg (ref 26.0–34.0)
MCHC: 30.4 g/dL (ref 30.0–36.0)
MCV: 96.3 fL (ref 80.0–100.0)
Platelets: 280 K/uL (ref 150–400)
RBC: 4.06 MIL/uL (ref 3.87–5.11)
RDW: 15.3 % (ref 11.5–15.5)
WBC: 13.8 K/uL — ABNORMAL HIGH (ref 4.0–10.5)
nRBC: 0 % (ref 0.0–0.2)

## 2023-09-10 LAB — BASIC METABOLIC PANEL WITH GFR
Anion gap: 8 (ref 5–15)
BUN: 39 mg/dL — ABNORMAL HIGH (ref 8–23)
CO2: 30 mmol/L (ref 22–32)
Calcium: 9.4 mg/dL (ref 8.9–10.3)
Chloride: 104 mmol/L (ref 98–111)
Creatinine, Ser: 0.93 mg/dL (ref 0.44–1.00)
GFR, Estimated: >60 mL/min
Glucose, Bld: 113 mg/dL — ABNORMAL HIGH (ref 70–99)
Potassium: 4.6 mmol/L (ref 3.5–5.1)
Sodium: 142 mmol/L (ref 135–145)

## 2023-09-10 LAB — LIPID PANEL
Cholesterol: 176 mg/dL (ref 0–200)
HDL: 55 mg/dL (ref >40)
LDL Cholesterol: 98 mg/dL (ref 0–99)
Total CHOL/HDL Ratio: 3.2 ratio
Triglycerides: 117 mg/dL (ref <150)
VLDL: 23 mg/dL (ref 0–40)

## 2023-09-10 LAB — GLUCOSE, CAPILLARY
Glucose-Capillary: 92 mg/dL (ref 70–99)
Glucose-Capillary: 214 mg/dL — ABNORMAL HIGH (ref 70–99)

## 2023-09-10 LAB — BRAIN NATRIURETIC PEPTIDE: B Natriuretic Peptide: 4307.8 pg/mL — ABNORMAL HIGH (ref 0.0–100.0)

## 2023-09-10 LAB — HEPARIN LEVEL (UNFRACTIONATED): Heparin Unfractionated: 0.42 [IU]/mL (ref 0.30–0.70)

## 2023-09-10 MED ORDER — PREDNISONE 20 MG PO TABS: 20.0000 mg | ORAL_TABLET | Freq: Every day | ORAL | Status: DC

## 2023-09-10 MED ORDER — PREDNISONE 10 MG PO TABS: 10.0000 mg | ORAL_TABLET | Freq: Every day | ORAL | Status: DC

## 2023-09-10 MED ORDER — THIAMINE MONONITRATE 100 MG PO TABS
100.0000 mg | ORAL_TABLET | Freq: Every day | ORAL | Status: AC
  Administered 2023-09-11: 100 mg via ORAL
  Filled 2023-09-10: qty 1

## 2023-09-10 MED ORDER — POLYETHYLENE GLYCOL 3350 17 G PO PACK
17.0000 g | PACK | Freq: Every day | ORAL | Status: DC
  Administered 2023-09-10 – 2023-09-11 (×2): 17 g via ORAL
  Filled 2023-09-10 (×2): qty 1

## 2023-09-10 MED ORDER — PREDNISONE 20 MG PO TABS
40.0000 mg | ORAL_TABLET | Freq: Every day | ORAL | Status: AC
  Administered 2023-09-11: 40 mg via ORAL
  Filled 2023-09-10: qty 2

## 2023-09-10 MED ORDER — HALOPERIDOL LACTATE 5 MG/ML IJ SOLN
2.0000 mg | Freq: Once | INTRAMUSCULAR | Status: AC
  Administered 2023-09-10: 2 mg via INTRAVENOUS
  Filled 2023-09-10: qty 1

## 2023-09-10 MED ORDER — UMECLIDINIUM-VILANTEROL 62.5-25 MCG/ACT IN AEPB
1.0000 | INHALATION_SPRAY | Freq: Every day | RESPIRATORY_TRACT | Status: DC
  Administered 2023-09-13: 1 via RESPIRATORY_TRACT
  Filled 2023-09-10 (×2): qty 14

## 2023-09-10 MED ORDER — NICOTINE 21 MG/24HR TD PT24
21.0000 mg | MEDICATED_PATCH | Freq: Every day | TRANSDERMAL | Status: DC
  Administered 2023-09-10 – 2023-09-17 (×7): 21 mg via TRANSDERMAL
  Filled 2023-09-10 (×7): qty 1

## 2023-09-10 MED ORDER — HEPARIN SODIUM (PORCINE) 5000 UNIT/ML IJ SOLN
5000.0000 [IU] | Freq: Three times a day (TID) | INTRAMUSCULAR | Status: DC
  Administered 2023-09-12 – 2023-09-17 (×15): 5000 [IU] via SUBCUTANEOUS
  Filled 2023-09-10 (×15): qty 1

## 2023-09-10 MED ORDER — ASPIRIN 81 MG PO CHEW
81.0000 mg | CHEWABLE_TABLET | Freq: Every day | ORAL | Status: DC
  Administered 2023-09-11: 81 mg via ORAL
  Filled 2023-09-10: qty 1

## 2023-09-10 MED ORDER — BISACODYL 10 MG RE SUPP: 10.0000 mg | Freq: Every day | RECTAL | Status: DC | PRN

## 2023-09-10 MED ORDER — PREDNISONE 20 MG PO TABS: 40.0000 mg | ORAL_TABLET | Freq: Every day | ORAL | Status: DC

## 2023-09-10 MED ORDER — HEPARIN (PORCINE) 25000 UT/250ML-% IV SOLN
900.0000 [IU]/h | INTRAVENOUS | Status: AC
  Administered 2023-09-10: 900 [IU]/h via INTRAVENOUS

## 2023-09-10 MED ORDER — HEPARIN (PORCINE) 25000 UT/250ML-% IV SOLN: 900.0000 [IU]/h | INTRAVENOUS | Status: DC

## 2023-09-10 MED ORDER — PREDNISONE 20 MG PO TABS: 30.0000 mg | ORAL_TABLET | Freq: Every day | ORAL | Status: DC

## 2023-09-10 MED ORDER — ADULT MULTIVITAMIN W/MINERALS CH
1.0000 | ORAL_TABLET | Freq: Every day | ORAL | Status: DC
  Administered 2023-09-11: 1 via ORAL
  Filled 2023-09-10 (×2): qty 1

## 2023-09-10 NOTE — Progress Notes (Signed)
 PICC removal order: RN requested PIV to remove PICC. R forearm assessed with US . No suitable veins noted. L forearm Mepilex dressing in place. L medial AC bruised and edematous. Recommend keeping PICC for infusion and lab. Consider exchange to single vs dual lumen.

## 2023-09-10 NOTE — IPAL (Signed)
  Interdisciplinary Goals of Care Family Meeting   Date carried out: 09/10/2023  Location of the meeting: Conference room  Member's involved: Physician, Family Member or next of kin, and Palliative care team member  Durable Power of Attorney or acting medical decision maker: husband    Discussion: We discussed goals of care for [name] .  I met with Carla [name] husband, son Carla [name], and other son's fiancee Carla [name] with NP Ferolito from Palliative care. We discussed her COPD and dementia as chronic progressive diseases that will continue to progress over time.  We reviewed the struggles the family has had at home with her using oxygen , nebs, taking medications, and even getting her to eat. She has had aggressive behaviors and behavioral outbursts at home related to her dementia. They acknowledged what a challenge it has been, but they are dedicated to keeping her at home where she wants to be. 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   We discussed options moving forward. We introduced the concept of hospice in end of life care where symptom management and being comfortable at home is the priority. We talked about the possibility of us  getting to the point of doing things to her that no longer are benefiting her, possibly even making things worse. Her family understands this, especially with picking their battles at home. We talked about options moving forward including limiting her care during future hospitalizations if we get to the point that we are causing more suffering than benefit. They want to talk as a family before making any decisions.   Code status: not addressed during today's meeting  Disposition: Continue current acute care   Time spent for the meeting: 40 min.  Joesph Mussel 09/10/2023, 4:28 PM

## 2023-09-10 NOTE — Consult Note (Addendum)
 Cardiology Consultation  Patient ID: Carla [name] MRN: [id number]; DOB: Aug 19, 1945  Admit date: 09/06/2023 Date of Consult: 09/10/2023  PCP:  Patient, [name]   [facility] Providers Cardiologist:  None     Patient Profile: Carla [name] is a 78 y.o. female with a hx of COPD, hypertension, dementia (AAOx1 at baseline), anxiety, depression, aortic regurgitation, GERD, primary hyperparathyroidism, HLD, osteoporosis who is being seen 09/10/2023 for the evaluation of elevated troponin levels at the request of Dr. Fulton Job.  History of Present Illness: Carla [name] with past medical history as above. Presented to [facility] via EMS hypoxic with SpO2 at 80% on RA. She was placed on 15 L and SpO2 rose to 96%. Her family reported that she had not been eating well for about one week.   While in the ED she failed BiPAP as she was confused and not able to keep the mask on. She was also unable to tolerate HFNC therefore was intubated. She was admitted with COPD exacerbation and became hypotensive, requiring Levophed .   She was extubated 6/17, taken off pressors and had CVP of 4. She was found to have acute on chronic hypoxic and hypercarbic respiratory failure secondary to CAP. She was found to have new reduced EF of 35-40%, previous EF 05/2023 was 55-60%. She was found to have elevated troponin levels 308 > 246. She was noted to have RWMA on echocardiogram with reduced EF and has been on IV heparin  x 48 hours for medical treatment of NSTEMI. BP has been low-normal, requiring pressors at some point but has since recovered, most recent BP 137/68.   She last saw Dr. Amanda Jungling 06/2019 as follow up. At this appointment he was continued on medical therapy for CAD ASA 81 mg daily, Crestor  40 mg daily, amlodipine  10 mg daily. She was asymptomatic at this time and did not require any further ischemic testing at that time.   Upon evaluation of patient, she was alone without family present. She was  arouseable and was able to answer questions appropriately. She denied any current chest pain and told me that her breathing has improved since being admitted. She refused to keep her Belk on and removed it multiple times while I was in the room. SpO2 remained around 92-94% without it on while I was present. Would be beneficial to be able to speak to her family at some point to be able to determine true goals of care. Through chart review, it appears that the patient's husband was attempted to be reached however he was unable to tell them when he would have time to come in.   Past Medical History:  Diagnosis Date   Anxiety    ASCVD (arteriosclerotic cardiovascular disease)    No critical disease on cath in 8/97: mild AI with trival, if any l AS; negative stress nuclear in 2010   Atrophic vaginitis 04/18/2016   Back pain    Cervical disc herniation    Chronic bronchitis    Colon polyps    De Quervain's disease (radial styloid tenosynovitis) 10/14/2016   Dementia (HCC) 06/10/2020   Depression    Elevated hemoglobin A1c    Fasting hyperglycemia    GERD (gastroesophageal reflux disease)    Heart disease    HSV (herpes simplex virus) infection    Hydronephrosis    Hypercalcemia 12/30/2008   Qualifier: Diagnosis of  By: Gardenia Jump LPN, Brandi     Hypercholesteremia    Hypertension    Neck muscle spasm  04/23/2014   Onychomycosis 07/08/2015   Osteoporosis    Peripheral vascular disease (HCC)    stauts post aortabifemoral graft     Secondary erythrocytosis 08/05/2012   Secondary to COPD   Tobacco abuse    has stopped   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 Fields:Normal colon without evidence of polyps, masses, inflammatory changes/Normal retroflex view of the rectum   COLONOSCOPY N/A 08/24/2013   Procedure: COLONOSCOPY;  Surgeon: Alyce Jubilee,  MD;  Location: [facility];  Service: Endoscopy;  Laterality: N/A;  10:30-moved to [address] notified pt   lysis of adhesions  2000   PARTIAL HYSTERECTOMY     right kidney surgery following damage during arterial surgery     TOTAL ABDOMINAL HYSTERECTOMY W/ BILATERAL SALPINGOOPHORECTOMY  1975    Home Medications:  Prior to Admission medications   Medication Sig Start Date End Date Taking? Authorizing Provider  acetaminophen  (TYLENOL ) 325 MG tablet Take 2 tablets (650 mg total) by mouth every 6 (six) hours as needed for mild pain (pain score 1-3) (or Fever >/= 101). Patient not taking: Reported on 09/07/2023 06/11/23   Bobbetta Burnet, MD  ALPRAZolam  (XANAX ) 0.25 MG tablet Take 1 tablet (0.25 mg total) by mouth 2 (two) times daily. Patient not taking: Reported on 09/07/2023 06/13/23   Jodeane Mulligan, DO  arformoterol  (BROVANA ) 15 MCG/2ML NEBU Take 2 mLs (15 mcg total) by nebulization 2 (two) times daily. Patient not taking: Reported on 09/07/2023 06/11/23   Shahmehdi, Seyed A, MD  ASHWAGANDHA PO Take 1 capsule by mouth at bedtime. Patient not taking: Reported on 09/07/2023    [provider]  levalbuterol  (XOPENEX ) 0.63 MG/3ML nebulizer solution Take 3 mLs (0.63 mg total) by nebulization 2 (two) times daily. Patient not taking: Reported on 09/07/2023 06/11/23   Shahmehdi, Seyed A, MD  metoprolol  succinate (TOPROL -XL) 25 MG 24 hr tablet Take 1 tablet (25 mg total) by mouth daily. Patient not taking: Reported on 09/07/2023 06/12/23   Bobbetta Burnet, MD  mirtazapine  (REMERON  SOL-TAB) 30 MG disintegrating tablet Take 1 tablet (30 mg total) by mouth at bedtime. Patient not taking: Reported on 09/07/2023 06/13/23   Jodeane Mulligan, DO  Nebulizers (COMPRESSOR/NEBULIZER) MISC 1 each by Does not apply route as directed. 06/13/23   Jodeane Mulligan, DO  nicotine  (NICODERM CQ  - DOSED IN MG/24 HOURS) 14 mg/24hr patch Place 1 patch (14 mg total) onto the skin daily as needed (nicotine   craving). Patient not taking: Reported on 09/07/2023 06/11/23   Bobbetta Burnet, MD  QUEtiapine  (SEROQUEL ) 100 MG tablet Take 1 tablet (100 mg total) by mouth at bedtime. Patient not taking: Reported on 09/07/2023 06/13/23   Jodeane Mulligan, DO  revefenacin  (YUPELRI ) 175 MCG/3ML nebulizer solution Take 3 mLs (175 mcg total) by nebulization daily. Patient not taking: Reported on 09/07/2023 06/12/23   Bobbetta Burnet, MD  senna-docusate (SENOKOT-S) 8.6-50 MG tablet Take 1 tablet by mouth at bedtime. Patient not taking: Reported on 09/07/2023 06/11/23   Bobbetta Burnet, MD  tamsulosin  (FLOMAX ) 0.4 MG CAPS capsule Take 1 capsule (0.4 mg total) by mouth daily. Patient not taking: Reported on 09/07/2023 06/13/23   Jodeane Mulligan, DO   Scheduled Meds:  [START ON 09/11/2023] aspirin   81 mg Oral Daily  Chlorhexidine  Gluconate Cloth  6 each Topical Daily   [START ON 09/11/2023] heparin  injection (subcutaneous)  5,000 Units Subcutaneous Q8H   [START ON 09/11/2023] multivitamin with minerals  1 tablet Oral Daily   nicotine   21 mg Transdermal Daily   mouth rinse  15 mL Mouth Rinse Q2H   polyethylene glycol  17 g Oral Daily   [START ON 09/11/2023] predniSONE   40 mg Oral Q breakfast   Followed by   Cecily Cohen ON 09/12/2023] predniSONE   30 mg Oral Q breakfast   Followed by   Cecily Cohen ON 09/13/2023] predniSONE   20 mg Oral Q breakfast   Followed by   Cecily Cohen ON 09/14/2023] predniSONE   10 mg Oral Q breakfast   sodium chloride  flush  10-40 mL Intracatheter Q12H   [START ON 09/11/2023] thiamine  100 mg Oral Daily   umeclidinium-vilanterol  1 puff Inhalation Daily   Continuous Infusions:  azithromycin  Stopped (09/09/23 1912)   cefTRIAXone  (ROCEPHIN )  IV Stopped (09/09/23 2024)   heparin  900 Units/hr (09/10/23 1118)   PRN Meds: bisacodyl , mouth rinse, sodium chloride  flush  Allergies:    Allergies  Allergen Reactions   Aricept  [Donepezil  Hcl] Diarrhea   Calcium -Containing Compounds Other (See Comments)    Pt  has hyperparathyroidism which results in hypercalcemia   Exelon  [Rivastigmine ] Rash   Social History:   Social History   Socioeconomic History   Marital status: Married    Spouse name: Not on file   Number of children: 3   Years of education: Not on file   Highest education level: Not on file  Occupational History   Occupation: disabled   Tobacco Use   Smoking status: Every Day    Current packs/day: 0.00    Average packs/day: 0.5 packs/day for 50.0 years (25.0 ttl pk-yrs)    Types: Cigarettes    Start date: 12/11/1960    Last attempt to quit: 12/12/2010    Years since quitting: 12.7   Smokeless tobacco: Never  Vaping Use   Vaping status: Never Used  Substance and Sexual Activity   Alcohol use: No   Drug use: No   Sexual activity: Not Currently    Birth control/protection: Surgical  Other Topics Concern   Not on file  Social History Narrative   2 Children living 1 deceased    Social Drivers of Health   Financial Resource Strain: Medium Risk (02/24/2020)   Overall Financial Resource Strain (CARDIA)    Difficulty of Paying Living Expenses: Somewhat hard  Food Insecurity: Patient Unable To Answer (09/08/2023)   Hunger Vital Sign    Worried About Running Out of Food in the Last Year: Patient unable to answer    Ran Out of Food in the Last Year: Patient unable to answer  Transportation Needs: Patient Unable To Answer (09/09/2023)   PRAPARE - Transportation    Lack of Transportation (Medical): Patient unable to answer    Lack of Transportation (Non-Medical): Patient unable to answer  Physical Activity: Insufficiently Active (02/24/2020)   Exercise Vital Sign    Days of Exercise per Week: 3 days    Minutes of Exercise per Session: 30 min  Stress: No Stress Concern Present (02/24/2020)   [employer] of Occupational Health - Occupational Stress Questionnaire    Feeling of Stress : Only a little  Social Connections: Patient Unable To Answer (09/09/2023)   Social Connection  and Isolation Panel    Frequency of Communication with Friends and Family: Patient unable to answer    Frequency of Social  Gatherings with Friends and Family: Patient unable to answer    Attends Religious Services: Patient unable to answer    Active Member of Clubs or Organizations: Patient unable to answer    Attends Club or Organization Meetings: Patient unable to answer    Marital Status: Patient unable to answer  Intimate Partner Violence: Patient Unable To Answer (09/08/2023)   Humiliation, Afraid, Rape, and Kick questionnaire    Fear of Current or Ex-Partner: Patient unable to answer    Emotionally Abused: Patient unable to answer    Physically Abused: Patient unable to answer    Sexually Abused: Patient unable to answer    Family History:   Family History  Problem Relation Age of Onset   Heart attack Father    COPD Sister    Heart disease Sister    Diabetes Sister    Coronary artery disease Sister    Liver cancer Sister 51   Diabetes Sister    Diabetes Sister    COPD Sister    Heart attack Mother    COPD Brother    Heart attack Son    Colon cancer Neg Hx     ROS:  Please see the history of present illness.  All other ROS reviewed and negative.     Physical Exam/Data: Vitals:   09/10/23 0600 09/10/23 0700 09/10/23 0800 09/10/23 1403  BP: 136/62 137/74 134/70 137/68  Pulse: 65 65 64 66  Resp: (!) 23 20 (!) 24 16  Temp: 98.1 F (36.7 C) 98.2 F (36.8 C) 98.1 F (36.7 C) 98.2 F (36.8 C)  TempSrc:    Oral  SpO2: 95% 98% 97% 98%  Weight:      Height:       Intake/Output Summary (Last 24 hours) at 09/10/2023 1525 Last data filed at 09/10/2023 0300 Gross per 24 hour  Intake 448.03 ml  Output --  Net 448.03 ml      09/10/2023    5:00 AM 09/09/2023    5:00 AM 09/08/2023    2:00 PM  Last 3 Weights  Weight (lbs) 94 lb 2.2 oz 95 lb 7.4 oz 93 lb 4.1 oz  Weight (kg) 42.7 kg 43.3 kg 42.3 kg     Body mass index is 18.38 kg/m.   General:  chronically  ill-appearing female, in no acute distress, refusing to wear [place] HEENT: normal Neck: no JVD Vascular: Distal pulses 2+ bilaterally Cardiac:  normal S1, S2; RRR; no murmur  Lungs:  diminished breath sounds, not labored, coughing frequently   Abd: soft, nontender Ext: no edema Musculoskeletal:  No deformities Skin: warm and dry  Neuro:  answers questions appropriately, at baseline per nursing staff   Telemetry:  Telemetry was personally reviewed and demonstrates:  sinus rhythm, HR 70s   Relevant CV Studies:  Echocardiogram, 09/07/2023 Left ventricular ejection fraction, by estimation, is 35 to 40% . The left ventricle has moderately decreased function. The left ventricle demonstrates regional wall motion abnormalities ( see scoring diagram/ findings for description) . There is mild concentric left ventricular hypertrophy. Left ventricular diastolic parameters are consistent with Grade II diastolic dysfunction ( pseudonormalization) .  Right ventricular systolic function is mildly reduced. The right ventricular size is normal. There is mildly elevated pulmonary artery systolic pressure. The estimated right ventricular systolic pressure is 37. 8 mmHg.  A small pericardial effusion is present. The pericardial effusion is surrounding the apex and circumferential. Large pleural effusion in the left lateral region.  The mitral valve is normal in  structure. No evidence of mitral valve regurgitation. No evidence of mitral stenosis. The aortic valve is tricuspid. There is mild calcification of the aortic valve. Aortic valve regurgitation is mild. Aortic valve sclerosis is present, with no evidence of aortic valve stenosis.  The inferior vena cava is dilated in size with < 50% respiratory variability, suggesting right atrial pressure of 15 mmHg.  Comparison(s) : Prior images reviewed side by side. Significant changes have occured.  Nuclear stress test 05/2015 Blood pressure demonstrated a hypertensive  response to exercise. Baseline 0.5 mm horizontal ST depressions inferior leads at rest. Horizontal ST segment depression 1mm depression was noted during stress in the II, III and aVF leads. Nonspecific change from baseline The study is normal. No perfusion defects consistent with prior infarct or current ischemia. This is a low risk study. The left ventricular ejection fraction is hyperdynamic (>65%).  Laboratory Data: High Sensitivity Troponin:   Recent Labs  Lab 09/06/23 2220 09/07/23 0013  TROPONINIHS 308* 246*     Chemistry Recent Labs  Lab 09/07/23 1629 09/08/23 0259 09/08/23 2241 09/09/23 0332 09/09/23 1508 09/10/23 0444  NA  --  139  --  138 140 142  K  --  4.3  --  3.8 4.9 4.6  CL  --  103  --  102  --  104  CO2  --  25  --  29  --  30  GLUCOSE  --  163*  --  173*  --  113*  BUN  --  40*  --  52*  --  39*  CREATININE  --  1.51*  --  1.20*  --  0.93  CALCIUM   --  9.1  --  8.2*  --  9.4  MG 2.7* 2.6* 2.5*  --   --   --   GFRNONAA  --  35*  --  46*  --  >60  ANIONGAP  --  11  --  7  --  8    Recent Labs  Lab 09/06/23 2226 09/07/23 0452  PROT 7.0 6.5  ALBUMIN 3.3* 2.9*  AST 25 61*  ALT 17 27  ALKPHOS 113 161*  BILITOT 0.6 0.4   Lipids  Recent Labs  Lab 09/10/23 0444  CHOL 176  TRIG 117  HDL 55  LDLCALC 98  CHOLHDL 3.2    Hematology Recent Labs  Lab 09/08/23 0259 09/09/23 0332 09/09/23 1508 09/10/23 0444  WBC 24.4* 14.6*  --  13.8*  RBC 4.37 3.78*  --  4.06  HGB 12.9 11.0* 12.2 11.9*  HCT 42.2 35.7* 36.0 39.1  MCV 96.6 94.4  --  96.3  MCH 29.5 29.1  --  29.3  MCHC 30.6 30.8  --  30.4  RDW 15.0 15.1  --  15.3  PLT 228 265  --  280   Thyroid  No results for input(s): TSH, FREET4 in the last 168 hours.  BNP Recent Labs  Lab 09/06/23 2228  BNP >4,500.0*    DDimer No results for input(s): DDIMER in the last 168 hours.  Radiology/Studies:  US  EKG SITE RITE Result Date: 09/08/2023 If Site Rite image not attached, placement could not  be confirmed due to current cardiac rhythm.  ECHOCARDIOGRAM COMPLETE Result Date: 09/07/2023    ECHOCARDIOGRAM REPORT   Patient Name:   GLADYCE MCRAY Date of Exam: 09/07/2023 Medical Rec #:  [id number]        Height:       60.0 in Accession #:  [id number]       Weight:       91.3 lb Date of Birth:  Apr 15, 1945        BSA:          1.337 m Patient Age:    78 years         BP:           111/56 mmHg Patient Gender: F                HR:           60 bpm. Exam Location:  Inpatient Procedure: 2D Echo (Both Spectral and Color Flow Doppler were utilized during            procedure). Indications:    acute respiratory distress  History:        Patient has prior history of Echocardiogram examinations, most                 recent 06/02/2023. COPD, Signs/Symptoms:elevated troponin; Risk                 Factors:Hypertension.  Sonographer:    Dione Franks RDCS Referring Phys: [id number] [place]  Sonographer Comments: Echo performed with patient supine and on artificial respirator. IMPRESSIONS  1. Left ventricular ejection fraction, by estimation, is 35 to 40%. The left ventricle has moderately decreased function. The left ventricle demonstrates regional wall motion abnormalities (see scoring diagram/findings for description). There is mild concentric left ventricular hypertrophy. Left ventricular diastolic parameters are consistent with Grade II diastolic dysfunction (pseudonormalization).  2. Right ventricular systolic function is mildly reduced. The right ventricular size is normal. There is mildly elevated pulmonary artery systolic pressure. The estimated right ventricular systolic pressure is 37.8 mmHg.  3. A small pericardial effusion is present. The pericardial effusion is surrounding the apex and circumferential. Large pleural effusion in the left lateral region.  4. The mitral valve is normal in structure. No evidence of mitral valve regurgitation. No evidence of mitral stenosis.  5. The aortic valve is  tricuspid. There is mild calcification of the aortic valve. Aortic valve regurgitation is mild. Aortic valve sclerosis is present, with no evidence of aortic valve stenosis.  6. The inferior vena cava is dilated in size with <50% respiratory variability, suggesting right atrial pressure of 15 mmHg. Comparison(s): Prior images reviewed side by side. Significant changes have occured. FINDINGS  Left Ventricle: Left ventricular ejection fraction, by estimation, is 35 to 40%. The left ventricle has moderately decreased function. The left ventricle demonstrates regional wall motion abnormalities. The left ventricular internal cavity size was normal in size. There is mild concentric left ventricular hypertrophy. Left ventricular diastolic parameters are consistent with Grade II diastolic dysfunction (pseudonormalization).  LV Wall Scoring: The apical lateral segment, mid anterolateral segment, mid inferoseptal segment, apical septal segment, and apex are akinetic. Right Ventricle: The right ventricular size is normal. No increase in right ventricular wall thickness. Right ventricular systolic function is mildly reduced. There is mildly elevated pulmonary artery systolic pressure. The tricuspid regurgitant velocity  is 2.39 m/s, and with an assumed right atrial pressure of 15 mmHg, the estimated right ventricular systolic pressure is 37.8 mmHg. Left Atrium: Left atrial size was normal in size. Right Atrium: Right atrial size was normal in size. Pericardium: A small pericardial effusion is present. The pericardial effusion is surrounding the apex and circumferential. Mitral Valve: The mitral valve is normal in structure. No evidence of mitral valve regurgitation. No evidence of mitral valve  stenosis. Tricuspid Valve: The tricuspid valve is normal in structure. Tricuspid valve regurgitation is trivial. No evidence of tricuspid stenosis. Aortic Valve: The aortic valve is tricuspid. There is mild calcification of the aortic  valve. Aortic valve regurgitation is mild. Aortic regurgitation PHT measures 667 msec. Aortic valve sclerosis is present, with no evidence of aortic valve stenosis. Pulmonic Valve: The pulmonic valve was normal in structure. Pulmonic valve regurgitation is not visualized. No evidence of pulmonic stenosis. Aorta: The aortic root and ascending aorta are structurally normal, with no evidence of dilitation. Venous: The inferior vena cava is dilated in size with less than 50% respiratory variability, suggesting right atrial pressure of 15 mmHg. IAS/Shunts: The atrial septum is grossly normal. Additional Comments: There is a large pleural effusion in the left lateral region.  LEFT VENTRICLE PLAX 2D LVIDd:         4.10 cm     Diastology LVIDs:         3.20 cm     LV e' medial:    2.94 cm/s LV PW:         1.20 cm     LV E/e' medial:  26.0 LV IVS:        1.20 cm     LV e' lateral:   3.59 cm/s LVOT diam:     1.90 cm     LV E/e' lateral: 21.3 LV SV:         59 LV SV Index:   44 LVOT Area:     2.84 cm  LV Volumes (MOD) LV vol d, MOD A4C: 92.1 ml LV vol s, MOD A4C: 58.8 ml LV SV MOD A4C:     92.1 ml RIGHT VENTRICLE            IVC RV Basal diam:  2.70 cm    IVC diam: 2.50 cm RV S prime:     7.29 cm/s TAPSE (M-mode): 0.9 cm LEFT ATRIUM             Index        RIGHT ATRIUM           Index LA diam:        3.50 cm 2.62 cm/m   RA Area:     11.20 cm LA Vol (A2C):   56.6 ml 42.32 ml/m  RA Volume:   25.20 ml  18.84 ml/m LA Vol (A4C):   24.1 ml 18.02 ml/m LA Biplane Vol: 39.8 ml 29.76 ml/m  AORTIC VALVE LVOT Vmax:   106.00 cm/s LVOT Vmean:  65.600 cm/s LVOT VTI:    0.209 m AI PHT:      667 msec  AORTA Ao Root diam: 3.20 cm Ao Asc diam:  3.20 cm MITRAL VALVE                TRICUSPID VALVE MV Area (PHT): 3.89 cm     TR Peak grad:   22.8 mmHg MV Decel Time: 195 msec     TR Vmax:        239.00 cm/s MV E velocity: 76.40 cm/s MV A velocity: 110.00 cm/s  SHUNTS MV E/A ratio:  0.69         Systemic VTI:  0.21 m                              Systemic Diam: 1.90 cm Gloriann Larger MD Electronically signed by Gloriann Larger MD Signature Date/Time: 09/07/2023/12:56:13 PM  Final    DG Chest Port 1 View Result Date: 09/07/2023 CLINICAL DATA:  Acute respiratory failurer. EXAM: PORTABLE CHEST 1 VIEW COMPARISON:  09/07/2023 FINDINGS: Endotracheal tube tip is approximately 2.8 cm above the base of the carina. The NG tube passes into the stomach although the distal tip position is not included on the film. Lungs are hyperexpanded. Cardiopericardial silhouette is at upper limits of normal for size. Left base collapse/consolidation again noted with small left pleural effusion. Interstitial markings are diffusely coarsened with chronic features. Bones are diffusely demineralized. Telemetry leads overlie the chest. IMPRESSION: 1. Endotracheal tube tip is approximately 2.8 cm above the base of the carina. 2. Left base collapse/consolidation with small left pleural effusion. Electronically Signed   By: Donnal Fusi M.D.   On: 09/07/2023 05:05   DG Chest Portable 1 View Result Date: 09/07/2023 CLINICAL DATA:  Status post intubation. EXAM: PORTABLE CHEST 1 VIEW COMPARISON:  September 06, 2023 FINDINGS: Since the prior study there is been interval placement of an endotracheal tube. Its distal tip is approximately 4.6 cm from the carina. Interval enteric tube position is also seen with its distal portion extending below the level of the diaphragm. The heart size and mediastinal contours are within normal limits. There is marked severity calcification of the aortic arch. The lungs are hyperinflated with stable diffuse chronic appearing increased interstitial lung markings. Moderate severity left basilar atelectasis and/or infiltrate is seen with a small left pleural effusion. No pneumothorax is identified. Multilevel degenerative changes seen throughout the thoracic spine. IMPRESSION: 1. Interval endotracheal tube and enteric tube placement, as described  above. 2. Moderate severity left basilar atelectasis and/or infiltrate with a small left pleural effusion. Electronically Signed   By: Virgle Grime M.D.   On: 09/07/2023 01:48   DG Chest Port 1 View Result Date: 09/06/2023 CLINICAL DATA:  Questionable sepsis-evaluate for abnormality. Difficulty breathing. Dementia. Decreased oral intake. EXAM: PORTABLE CHEST 1 VIEW COMPARISON:  06/14/2023 FINDINGS: Stable cardiomediastinal silhouette. Aortic atherosclerotic calcification. Diffuse interstitial coarsening has increased compared to 06/14/2023. Moderate left pleural effusion and left basilar airspace opacities. Small right pleural effusion. No pneumothorax. No displaced rib fractures. IMPRESSION: 1. Left lower lobe pneumonia. Small right and moderate left pleural effusions. Electronically Signed   By: Rozell Cornet M.D.   On: 09/06/2023 22:45   Assessment and Plan: NSTEMI Acute HFrEF, EF 35-40% previously 55-60% Patient presented in acute on chronic hypoxic and hypercarbic respiratory failure Troponin level 308 > 246 BNP > 4,500 on 6/15, repeat level now that patient is less acutely ill  Previously required intubation and Levophed  but now extubated and off pressors 6/17 BP stable today Echo this admission showed: EF 35-40%, LV RWMA, mild LVH, G2DD, mildly reduced RV function, small pericardial effusion, mild AR, dilated IVC  Renal function normalizing today, creatinine 0.93 Currently on IV heparin  x 48 hours for medical management Currently on ASA 81 mg daily  Agree with continuing medical management as above. Holding off on GDMT for now, given recently taken off pressors Patient does not appear volume overloaded on exam, do not think diuretics are necessary at this point  Palliative waiting to meet with patient and family, family was not present today, to fully discuss goals of care with overall poor prognosis -- will hold off on any aggressive treatment until family can make a decision in  regards to goals of care   Per primary Acute respiratory failure with hypoxia and hypercapnia due to COPD exacerbation   Aspiration PNA Tobacco  abuse  Cachexia Dementia Septic shock, resolved Acute metabolic encephalopathy    Risk Assessment/Risk Scores:    TIMI Risk Score for Unstable Angina or Non-ST Elevation MI:   The patient's TIMI risk score is 4, which indicates a 20% risk of all cause mortality, new or recurrent myocardial infarction or need for urgent revascularization in the next 14 days.  New [place]  Heart Association (NYHA) Functional Class NYHA Class II    For questions or updates, please contact [facility] Please consult [email or web address] for contact info under    Signed, Jiles Mote, PA-C  09/10/2023 3:25 PM  I have personally seen and examined the patient.  My HPI, Exam, and assessment and plan are below, independent of the NPP above.  Carla [name] is a 78 year old with hypertension, COPD, and dementia who presents with acute hypoxic respiratory failure.  She has a history of hypertension, COPD, and dementia, with baseline orientation times one. Recently, she developed new cardiac issues including an ejection fraction of 35-40%, wall motion abnormalities, a small pericardial effusion, and aortic regurgitation, noted since June 02, 2023.  Her recent blood work showed a BNP of 4500 and elevated high sensitivity troponin levels, initially at 308 and decreasing to 246. She presented with hypoxic respiratory failure, with oxygen  saturation in the 80s, requiring 15 liters of oxygen . Due to her dementia, she could not tolerate BiPAP and was intubated. She was extubated on September 09, 2023, and her CVP decreased to 4.  She received 48 hours of anticoagulation for NSTEMI. Her blood pressure has been unstable, limiting medical therapy options. She was previously on aspirin , statin, and amlodipine  for coronary artery disease.  A family meeting was held on July 21, 2023, to discuss her care plan(no changes as per note). She is currently on aspirin  81 mg and was recently taken off pressors.  Patient notes she is well.  Would like a cigarette and a warm blanket.  I was able to get her a warm blanket with help from our NT team.  She does not remember Dr. Amanda Jungling, her cardiologist.  She is unable to discuss what a typical day is for her in regard to ADLs.  Exam notable for  Gen: no distress, chronically ill appearing Cardiac: No Rubs or Gallops, diastolic Murmur, RRR +2 Respiratory: Decreased breath sounds bilaterally.Normal rate and effort. GI: Soft, nontender, non-distended  MS: No  edema;  moves all extremities Integument: Skin feels warm Neuro:  At time of evaluation, alert and oriented to person  Very pleasant  Personally reviewed relevant tests;  LABS BNP: 4500 High sensitivity troponin: 308 High sensitivity troponin: 246 (downtending)  DIAGNOSTIC Echocardiography: Ejection fraction 35-40%, new wall motion abnormalities, small pericardial effusion, aortic regurgitation (06/02/2023) Telemetry: Rare PACs and PVCs, sinus rhythm with sinus tachycardia (09/10/2023)  In assessment and plan:   NSTEMI Demand II suspected in the setting of sepsis but with new RWMA cannot exclude plaque rupture - NSTEMI with new reduction in ejection fraction. Not a candidate for invasive therapy. Received 48 hours of anticoagulation with heparin  as medical management. Transition to aspirin  81 mg planned. Long-term management may be limited by dementia. - Continue aspirin  81 mg. - DC of heparin  after 48 hours - pericardial effusion without evidence of tamponade.  Heart failure with reduced ejection fraction Heart failure with reduced ejection fraction (35-40%) with new wall motion abnormalities and small pericardial effusion. Not starting goal-directed medical therapy or diuretics at this time due to current clinical status and blood  pressure concerns. Metoprolol   succinate 25 mg may be resumed if blood pressure stabilizes. - Resume metoprolol  succinate 25 mg if blood pressure stabilizes; we will likely optimized GDMT therapy tomorrow if no change in GOC is made (see PCCM note from this PM)  Acute hypoxic respiratory failure Acute hypoxic respiratory failure secondary to heart failure and community-acquired pneumonia. Initially presented with oxygen  saturation in the 80s, requiring 15 liters of oxygen . Intubated due to inability to tolerate BiPAP because of dementia. Extubated on September 09, 2023. Currently off pressors  - on ABX as per primary  Chronic obstructive pulmonary disease (COPD) - optimized by PCCM; unable to smoke in the building for safety  Dementia Dementia limits ability to tolerate certain therapies such as BiPAP. Oriented times one at baseline. Dementia may limit significant amount of care and palliative care may be appropriate.  Actively engaged with PC.  If no change in therapy goals tomorrow we will stop heparin  and optimize GDMT  Gloriann Larger, MD FASE [facility] Cardiologist [facility]  [address] [place], [address] [place], [place] [address] [phone]  5:02 PM

## 2023-09-10 NOTE — Progress Notes (Addendum)
 Palliative Medicine Inpatient Follow Up Note HPI: 78 y/o female with PMH for COPD/Emphysema, HTN, Dementia (AAOx1 at baseline), Anxiety, Depression, GERD, Primary hyperparathyroidism, Vit D Def, HLD, Osteoporosis, and prior tracheostomy in '24. She [facility] BIB EMS to [facility] for difficulty breathing, has subsequently been intubated.    Palliative care has seen Carla [name] in the past in April of '24 and March of '25. We have been consulted during this hospitalization for goals of care conversations.    Today's Discussion 09/10/2023  *Please note that this is a verbal dictation therefore any spelling or grammatical errors are due to the Dragon Medical One system interpretation.  Chart reviewed inclusive of vital signs, progress notes, laboratory results, and diagnostic images.   I met with Carla [name]'s night RN who shares that she has been awake though she has been aggravated by the fact that she cannot eat yet. From an O2 perspective she has been well on 3-4LPM [place].   I met with Carla [name] at bedside who is resting well, she arouses when spoken to. We discussed her current health though she shared she doesn't;t want to talk to me unless I can get her sometime to eat. We reviewed that the speech therapist would be by this morning to evaluate her for a diet. She denies pain, shortness of breath, or nausea.  Patients spouse was called to arrange an in person meeting however he has not yet determined a time whereby he or his son are able to come in.  _________________________________ Addendum:  Family meeting held this evening in the presence of patients spouse, Carla [name], son, Carla [name], and CG Carla [name].  Dr. Fulton Job and myself were the providers present.  Dr. Fulton Job explained patients advanced COPD and how difficult of a disease this will remain to be as patient neglects to take her medications as she should due to her dementia. Dr. Fulton Job shared that each COPD exacerbation worsens the patients overall condition. She shares that it  will be important moving into the future to consider the goals which are most important to the patient. Based upon her lack of wanting to be in the hospital and her generalized discomfort with interventions provided it was recommended that as a family it is considered what may or may not be of benefit to Carla [name] from the perspective of interventions.   We reviewed in addition the chronic progressive nature of dementia and how this disease is uncurable.We discussed that behaviors can become more aggressive with disease progression. We reviewed anticipation moving into the future in relation to care needs.  The chronic disease trajectory and patient generalized frailty and adult FTT were reviewed as sequelae of chronic disease processes.  The topic of hospice was gently introduced for family to consider further. I described hospice as a service for patients who have a life expectancy of 6 months or less. The goal of hospice is the preservation of dignity and quality at the end phases of life. Under hospice care, the focus changes from curative to symptom relief.   Patients family note that this is a great deal to consider and they would like to take a few days to determine what may be best for Carla [name]. They do recognize the difficulties associated with her behaviors and seem to understand the concerns regarding patients disease burden and long term outlook.   Questions and concerns addressed/Palliative Support Provided.   Add time: 47  Objective Assessment: Vital Signs Vitals:   09/10/23 0700 09/10/23 0800  BP: 137/74 134/70  Pulse: 65  64  Resp: 20 (!) 24  Temp: 98.2 F (36.8 C) 98.1 F (36.7 C)  SpO2: 98% 97%    Intake/Output Summary (Last 24 hours) at 09/10/2023 0954 Last data filed at 09/10/2023 0300 Gross per 24 hour  Intake 621.53 ml  Output --  Net 621.53 ml   Last Weight  Most recent update: 09/10/2023  7:08 AM    Weight  42.7 kg (94 lb 2.2 oz)            Gen:  Elderly Caucasian  F chronically ill appearing HEENT: dry mucous membranes CV: Regular rate and rhythm  PULM: On 4LPM [place], breathing is even and nonlabored ABD: soft/nontender  EXT: No edema Neuro: Awake and alert to self  SUMMARY OF RECOMMENDATIONS   DNR with interventions / Full scope of care --> No chest compressions   Family meeting held - patients family need time to consider options though the topic of hospice was gently approached   Ongoing PMT support ______________________________________________________________________________________ Carla [name] [facility] Palliative Medicine Team Team Cell Phone: [phone] Please utilize secure chat with additional questions, if there is no response within 30 minutes please call the above phone number  Time: 14  Palliative Medicine Team providers are available by phone from 7am to 7pm daily and can be reached through the team cell phone.  Should this patient require assistance outside of these hours, please call the patient's attending physician.

## 2023-09-10 NOTE — Evaluation (Signed)
 Clinical/Bedside Swallow Evaluation Patient Details  Name: Carla [name] MRN: [id number] Date of Birth: [birth year]  Today's Date: 09/10/2023 Time: SLP Start Time (ACUTE ONLY): 1023 SLP Stop Time (ACUTE ONLY): 1040 SLP Time Calculation (min) (ACUTE ONLY): 17 min  Past Medical History:  Past Medical History:  Diagnosis Date   Anxiety    ASCVD (arteriosclerotic cardiovascular disease)    No critical disease on cath in 8/97: mild AI with trival, if any l AS; negative stress nuclear in 2010   Atrophic vaginitis 04/18/2016   Back pain    Cervical disc herniation    Chronic bronchitis    Colon polyps    De Quervain's disease (radial styloid tenosynovitis) 10/14/2016   Dementia (HCC) 06/10/2020   Depression    Elevated hemoglobin A1c    Fasting hyperglycemia    GERD (gastroesophageal reflux disease)    Heart disease    HSV (herpes simplex virus) infection    Hydronephrosis    Hypercalcemia 12/30/2008   Qualifier: Diagnosis of  By: Gardenia Jump LPN, Brandi     Hypercholesteremia    Hypertension    Neck muscle spasm 04/23/2014   Onychomycosis 07/08/2015   Osteoporosis    Peripheral vascular disease (HCC)    stauts post aortabifemoral graft     Secondary erythrocytosis 08/05/2012   Secondary to COPD   Tobacco abuse    has stopped   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 Fields:Normal colon without evidence of polyps, masses, inflammatory changes/Normal retroflex view of the rectum   COLONOSCOPY N/A 08/24/2013   Procedure: COLONOSCOPY;  Surgeon: Alyce Jubilee, MD;  Location: [facility]O [facility]ITE;  Service: Endoscopy;  Laterality: N/A;  10:30-moved to [address] notified pt   lysis of adhesions  2000   PARTIAL HYSTERECTOMY     right kidney surgery following damage during arterial surgery     TOTAL ABDOMINAL  HYSTERECTOMY W/ BILATERAL SALPINGOOPHORECTOMY  1975   HPI:  Patient is a 78 y.o. female with PMH: dementia, COPD/Emphysema, HTN, anxiety, depression, GERD, primary hyperparathyroidism, HLD, osteoporosis, h/o prolonged need for trach. She presented to the hospital on 09/06/2023 via EMS to [facility] for difficulty breathing and per EMS, patient's SpO2 was 70% on RA improving to 96% on 15L. Family reported poor PO intake for past week. In ED, she failed BiPAP and was subsequently intubated. CXR showed left basilar infiltrate. She was extubated on 6/17. She was recently admitted to hospital [facility]) in March of 2025 for worsening SOB and in April of 2024 was hospitalized after being found unresponsive by family, was intubated 06/12/22, ultimately had tracheostomy placed 07/01/22 and self-decannulated 07/22/22.    Assessment / Plan / Recommendation  Clinical Impression  Patient with limited participation in evaluation but SLP able to assess her toleration of thin liquids (via cup and straw sips) and mechanical soft solids (fig newton bar). She fed herself, exhibited timely swallow initiation and did not exhibit any overt s/s aspiration during or immediately after PO intake. She had one congested sounding cough, however SLP not highly suspicious of aspiration. She did complain of her throat feeling sore. SLP recommening initiate PO diet of finger foods secondary to questioning her ability to self feed with utensils as well as safety of that secondary to her behaviors. SLP will follow up  at least once to ensure toleration. SLP Visit Diagnosis: Dysphagia, unspecified (R13.10)    Aspiration Risk  Mild aspiration risk    Diet Recommendation Regular;Thin liquid    Liquid Administration via: Cup;Straw Medication Administration: Whole meds with liquid Supervision: Patient able to self feed;Full supervision/cueing for compensatory strategies Compensations: Minimize environmental distractions Postural Changes: Seated upright  at 90 degrees    Other  Recommendations Oral Care Recommendations: Oral care BID     Assistance Recommended at Discharge    Functional Status Assessment Patient has had a recent decline in their functional status and demonstrates the ability to make significant improvements in function in a reasonable and predictable amount of time.  Frequency and Duration min 1 x/week  1 week       Prognosis Prognosis for improved oropharyngeal function: Good Barriers to Reach Goals: Cognitive deficits;Behavior      Swallow Study   General Date of Onset: 09/10/23 HPI: Patient is a 78 y.o. female with PMH: dementia, COPD/Emphysema, HTN, anxiety, depression, GERD, primary hyperparathyroidism, HLD, osteoporosis, h/o prolonged need for trach. She presented to the hospital on 09/06/2023 via EMS to [facility] for difficulty breathing and per EMS, patient's SpO2 was 70% on RA improving to 96% on 15L. Family reported poor PO intake for past week. In ED, she failed BiPAP and was subsequently intubated. CXR showed left basilar infiltrate. She was extubated on 6/17. She was recently admitted to hospital [facility]) in March of 2025 for worsening SOB and in April of 2024 was hospitalized after being found unresponsive by family, was intubated 06/12/22, ultimately had tracheostomy placed 07/01/22 and self-decannulated 07/22/22. Type of Study: Bedside Swallow Evaluation Previous Swallow Assessment: BSE during recent admission March 2025 Diet Prior to this Study: NPO Temperature Spikes Noted: No Respiratory Status: Nasal cannula History of Recent Intubation: Yes Total duration of intubation (days): 3 days Date extubated: 09/09/23 Behavior/Cognition: Alert;Agitated;Uncooperative;Impulsive;Requires cueing;Doesn't follow directions Oral Cavity Assessment: Other (comment) (does not allow for OME) Oral Care Completed by SLP: No Oral Cavity - Dentition: Dentures, top Vision: Functional for self-feeding Self-Feeding Abilities: Able to  feed self Patient Positioning: Upright in bed Baseline Vocal Quality: Normal Volitional Cough: Cognitively unable to elicit Volitional Swallow: Unable to elicit    Oral/Motor/Sensory Function Overall Oral Motor/Sensory Function: Other (comment) (does not participate in OME, no focal weakness observed)   Ice Chips     Thin Liquid Thin Liquid: Within functional limits Presentation: Self Fed;Cup;Straw    Nectar Thick     Honey Thick     Puree Puree: Not tested   Solid     Solid: Within functional limits Presentation: Self Fed     Jacqualine Mater, MA, CCC-SLP Speech Therapy

## 2023-09-10 NOTE — Progress Notes (Signed)
 OT Cancellation Note  Patient Details Name: Carla [name] MRN: [id number] DOB: 02-Nov-1945   Cancelled Treatment:    Reason Eval/Treat Not Completed: Patient declined, no reason specified (declines therapy evaluation, states she will get up when she is ready, then attempts to hit therapist's arm. Will reattempt as appropriate.)  Livier Hendel K, OTD, OTR/L SecureChat Preferred Acute Rehab [phone]   Benedict Brain Koonce 09/10/2023, 10:05 AM

## 2023-09-10 NOTE — Progress Notes (Signed)
 PT Cancellation Note  Patient Details Name: Carla [name] MRN: [id number] DOB: May 18, 1945   Cancelled Treatment:    Reason Eval/Treat Not Completed: (P) Patient declined, no reason specified Pt belligerent and reporting she will get up when she is damn well ready. PT will follow back for Evaluation later this afternoon as able.   Dannie Woolen B. Jewel Mortimer PT, DPT Acute Rehabilitation Services Please use secure chat or  Call Office [phone]    Verlie Glisson [facility] 09/10/2023, 10:04 AM

## 2023-09-10 NOTE — Progress Notes (Signed)
 NAME:  Carla [name], MRN:  [id number], DOB:  07/02/1945, LOS: 4 ADMISSION DATE:  09/06/2023, CONSULTATION DATE:  09/07/2023 REFERRING MD:  Gwenetta Lennert, MD, CHIEF COMPLAINT:  SOB  History of Present Illness:  78 y/o female with PMH for COPD/Emphysema (prior trach, self de-cannulated 06/2022), HTN, Dementia (AAOx1 at baseline), Anxiety, Depression, GERD, Primary hyperparathyroidism, Vit D Def, HLD, Osteoporosis, who was BIB EMS to [facility] for difficulty breathing, Per EMS RA sats 70% and placed on 15L sats 96%, per EMS and family she has not been eating well x 1 week. In ED BiPAP was tried but failed and given her confusion she was not keeping the mask on and high flow also did not work.  She was subsequently intubated.  She was admitted for COPD exacerbation and she became hypotensive requiring Levophed .  Family wants full code per documentation at [facility]. PH 7.26 PCO2 73 on VBG, BNP >4500, WBC 13, Cr 1.3, cxr showing left basilar infiltrate.  Pertinent  Medical History   Anxiety, ASCVD (arteriosclerotic cardiovascular disease), Atrophic vaginitis (04/18/2016), Back pain, Cervical disc herniation, Chronic bronchitis, Colon polyps, De Quervain's disease (radial styloid tenosynovitis) (10/14/2016), Dementia (HCC) (06/10/2020), Depression, Elevated hemoglobin A1c, Fasting hyperglycemia, GERD (gastroesophageal reflux disease), Heart disease, HSV (herpes simplex virus) infection, Hydronephrosis, Hypercalcemia (12/30/2008), Hypercholesteremia, Hypertension, Neck muscle spasm (04/23/2014), Onychomycosis (07/08/2015), Osteoporosis, Peripheral vascular disease (HCC), Secondary erythrocytosis (08/05/2012), Tobacco abuse, and 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  Significant Hospital Events: Including procedures, antibiotic start and stop dates in addition to other pertinent events   6/15: Transfer from [facility] to [facility] ICU admit, intubated on Levophed , echo EF 35-45, +WMA 6/16 PICC, coox 77, CVP 6, heparin  gtt for NSTEMI,  diureses 6/17 extubated, off pressors, CVP 4  Interim History / Subjective:  Hemodynamically stable C/o of being hungry and asking for a cigarette.  Oriented at baseline- person.  Did not pass bedside swallow, pending SLP but becomes agitated and pushed at staff to get away for not giving her food.  Refused her nebs.    Objective    Blood pressure 120/65, pulse 61, temperature 97.9 F (36.6 C), temperature source Bladder, resp. rate 19, height 5' (1.524 m), weight 42.7 kg, SpO2 100%. CVP:  [4 mmHg-8 mmHg] 8 mmHg  Vent Mode: CPAP;PSV FiO2 (%):  [36 %-40 %] 36 % Set Rate:  [26 bmp] 26 bmp Vt Set:  [360 mL] 360 mL PEEP:  [5 cmH20] 5 cmH20 Pressure Support:  [5 cmH20] 5 cmH20 Plateau Pressure:  [15 cmH20] 15 cmH20   Intake/Output Summary (Last 24 hours) at 09/10/2023 0801 Last data filed at 09/10/2023 0300 Gross per 24 hour  Intake 680.19 ml  Output --  Net 680.19 ml   Filed Weights   09/08/23 1400 09/09/23 0500 09/10/23 0500  Weight: 42.3 kg 43.3 kg 42.7 kg   Examination: General:  chronically ill appearing elderly female sitting in bed in NAD but then became very agitated when told has to wait for SLP for food HEENT: assessment limited due to intermittent agitation, no JVD, elevated JVP Neuro: awakens to verbal, oriented to self, MAE spont CV:  SR 60s, no murmur, RUE PICC PULM:  non labored, congested np cough, mostly clear and diminished  GI: soft, bs+, NT, foley -cyu Extremities: warm/dry, no edema  Skin: no rashes   CVP 5 afebrile UOP > no UOP documented>reportedly yest and in chamber Wts 43.3> 42.7kg Net I/Os inaccurate  CBG trend 92-142  Labs> K 4.6, BUN/ sCr  52/ 1.2>  39/ 0.93, WBC 14.6> 13.8, H/H 11.9/ 39  6/14 Bcx2> ngtd x3 6/15 RVP > neg 6/15 MRSA PCR > neg 6/14 SARs/ flu/ RSV> neg trach asp> not sent   Assessment and Plan  Acute on chronic hypoxic and hypercarbic respiratory failure 2/2 LLL CAP, small left pleural effusion and  AECOPD Tobacco abuse - MRSA PCR, RVP, SARS/ flu/ RSV, and strep ua neg P:  - extubated 6/17 - pending SLP, failed bedside.  Remains NPO - wean FiO2 as able for SpO2 92%, currently on 1L [place] - to complete 5 days of CAP treatment- azithro/ ctx - legionella neg - cont triple therapy nebs as tolerated - cont daily solumedrol for 5 days of therapy - aggressive pulm hygiene- IS, mobilize  - aspiration precautions   Shock, resolved- septic 2/2 PNA, possible cardiogenic and sedation needs could be contributing Lactic acidosis, resolving  - coox reassuring 77.8 6/17.  CVP 5 today.  Hold on diureses - off pressors after extubation 6/17 - d/c PICC  - remains hemodynamically stable  - follow cultures> ngtd - remains afebrile, decreasing WBC.  Continue to monitor - complete 5 days of azithro/ ctx - if recurrent shock> repeat echo given pericardial effusion   New HFrEF EF 35-40% NSTEMI Elevated BNP Small pericardial effusion Suspect chronic PH - 6/15 TTE > LVEF 35-40%, G2DD, +regional wall motion abnormalities (apical lateral segment, mid anterolateral segment, mid inferoseptal segment, apical septal segment, and apex are akinetic, RV mildly reduced, RVSP 37.8, small apex and circumferential pericardial effusion, noted left pleural effusion, mild aortic regurgitation, suggestive RA 15.  Previous EF 55-60% in March 2025 P:  - coox reassuring at 77.8 6/17 - cont heparin  per pharmacy empirically for 48hrs, completes this evening - have been managing medically for now but now extubated will consult cardiology for further recs  - ASA - optimize electrolytes  AKI, resolved - d/c foley, monitor for retention - resume flomax  when cleared for POs - trend renal indices  - strict I/Os, daily wts - avoid nephrotoxins, renal dose meds, hemodynamic support as above  Hyperglycemia, 2/2 reactive/ steroids  - A1c 5.9 - d/c prn SSI given lower CBGS while NPO  Dementia - Aox1 at baseline.  Seems  back to baseline - cont supportive care - delirium precautions - not taking per med rec> xanax / seroquel .  Caution with any sedating meds   At risk for malnutrition - pending SLP eval today   Tobacco abuse - nicotine  patch, smoking cessation when appropriate   GOC - appreciate PMT assistance.  Changed to limited code- no CPR 6/16.  Overall prognosis remains poor given multi co-morbidities, recurrent resp failure and now new HFrEF.   - plans for family meeting today with PMT for ongoing discussion of ongoing GOC  Overall> stable for transfer out of ICU and to [facility] as primary 6/19  Best Practice (right click and Reselect all SmartList Selections daily)   Diet/type: NPO DVT prophylaxis systemic heparin  Pressure ulcer(s): N/A GI prophylaxis: H2B Lines: Central line Foley:  Yes, and it is still needed> d/c today Code Status:  limited- no CPR   No family at bedside.  Husband has been difficult to reach by phone.  Pending family meeting today.      Early Glisson, MSN, AG-ACNP-BC [facility] 09/10/2023, 8:01 AM  See Amion for pager If no response to pager , please call [phone] until 7pm After 7:00 pm call Elink  [phone]

## 2023-09-10 NOTE — Progress Notes (Signed)
 PHARMACY - ANTICOAGULATION CONSULT NOTE  Pharmacy Consult for heparin   Indication: chest pain/ACS  Allergies  Allergen Reactions   Aricept  [Donepezil  Hcl] Diarrhea   Calcium -Containing Compounds Other (See Comments)    Pt has hyperparathyroidism which results in hypercalcemia   Exelon  [Rivastigmine ] Rash    Patient Measurements: Height: 5' (152.4 cm) Weight: 42.7 kg (94 lb 2.2 oz) IBW/kg (Calculated) : 45.5 HEPARIN  DW (KG): 42.3 Heparin  dosing weight: 42.3 kg   Vital Signs: Temp: 97.9 F (36.6 C) (06/17 2300) Temp Source: Bladder (06/18 0300) BP: 120/65 (06/17 2300) Pulse Rate: 61 (06/17 2300)  Labs: Recent Labs    09/08/23 0259 09/09/23 0053 09/09/23 0332 09/09/23 1202 09/09/23 1508 09/09/23 2150 09/10/23 0444 09/10/23 0613  HGB 12.9  --  11.0*  --  12.2  --  11.9*  --   HCT 42.2  --  35.7*  --  36.0  --  39.1  --   PLT 228  --  265  --   --   --  280  --   HEPARINUNFRC  --    < >  --  0.21*  --  0.24*  --  0.42  CREATININE 1.51*  --  1.20*  --   --   --  0.93  --    < > = values in this interval not displayed.    Estimated Creatinine Clearance: 33.6 mL/min (by C-G formula based on SCr of 0.93 mg/dL).   Assessment: Patient admitted with suspected COPD exacerbation, trop elevated  on admission to 308 down to 246. Echo showing motion wall changes and new reduced EF. HgB 12.9 and PLTs 228. Pt not on anticoagulation PTA. Pharmacy consulted to dose heparin  gtt. Planning to treat medically and consult cardiology after MV.   Heparin  level therapeutic at 0.42. HgB 11.9 and PLTs 280.    Goal of Therapy:  Heparin  level 0.3-0.7 units/ml Monitor platelets by anticoagulation protocol: Yes   Plan:  Continue heparin  infusion to 900 units/hr.  Add stop time for 48 hours after start per discussion with CCM.   Mamie Searles, PharmD, BCCCP  Please see amion for complete clinical pharmacist phone list 09/10/2023,8:11 AM

## 2023-09-10 NOTE — Progress Notes (Signed)
 Verbal order from Dr. Fulton Job for 2 MG IV haldol  x1

## 2023-09-10 NOTE — Progress Notes (Signed)
 Patient attempted to get OOB. I want my mother, I want to go home, find my clothes. RN and NT attempted to reorient patient back to the bedside. Patient adamant on sitting on edge of bed, insisting on nursing staff to provide clothes and cigarettes. RN educated patient on cigarettes and re-attempt once more to reorient back to bed. CN notified MD.

## 2023-09-11 DIAGNOSIS — I214 Non-ST elevation (NSTEMI) myocardial infarction: Secondary | ICD-10-CM | POA: Diagnosis not present

## 2023-09-11 DIAGNOSIS — G301 Alzheimer's disease with late onset: Secondary | ICD-10-CM | POA: Diagnosis not present

## 2023-09-11 DIAGNOSIS — I2510 Atherosclerotic heart disease of native coronary artery without angina pectoris: Secondary | ICD-10-CM | POA: Diagnosis not present

## 2023-09-11 DIAGNOSIS — J441 Chronic obstructive pulmonary disease with (acute) exacerbation: Secondary | ICD-10-CM | POA: Diagnosis not present

## 2023-09-11 DIAGNOSIS — J189 Pneumonia, unspecified organism: Secondary | ICD-10-CM | POA: Diagnosis not present

## 2023-09-11 DIAGNOSIS — I5021 Acute systolic (congestive) heart failure: Secondary | ICD-10-CM | POA: Diagnosis not present

## 2023-09-11 DIAGNOSIS — Z66 Do not resuscitate: Secondary | ICD-10-CM

## 2023-09-11 DIAGNOSIS — F02C18 Dementia in other diseases classified elsewhere, severe, with other behavioral disturbance: Secondary | ICD-10-CM

## 2023-09-11 DIAGNOSIS — Z7189 Other specified counseling: Secondary | ICD-10-CM | POA: Diagnosis not present

## 2023-09-11 DIAGNOSIS — J9621 Acute and chronic respiratory failure with hypoxia: Secondary | ICD-10-CM | POA: Diagnosis not present

## 2023-09-11 LAB — CULTURE, BLOOD (ROUTINE X 2)
Culture: NO GROWTH
Culture: NO GROWTH
Special Requests: ADEQUATE
Special Requests: ADEQUATE

## 2023-09-11 LAB — BASIC METABOLIC PANEL WITH GFR
Anion gap: 10 (ref 5–15)
BUN: 32 mg/dL — ABNORMAL HIGH (ref 8–23)
CO2: 31 mmol/L (ref 22–32)
Calcium: 9.7 mg/dL (ref 8.9–10.3)
Chloride: 102 mmol/L (ref 98–111)
Creatinine, Ser: 0.99 mg/dL (ref 0.44–1.00)
GFR, Estimated: 58 mL/min — ABNORMAL LOW (ref >60)
Glucose, Bld: 127 mg/dL — ABNORMAL HIGH (ref 70–99)
Potassium: 4.1 mmol/L (ref 3.5–5.1)
Sodium: 143 mmol/L (ref 135–145)

## 2023-09-11 LAB — CBC
HCT: 44 % (ref 36.0–46.0)
Hemoglobin: 13.3 g/dL (ref 12.0–15.0)
MCH: 29.4 pg (ref 26.0–34.0)
MCHC: 30.2 g/dL (ref 30.0–36.0)
MCV: 97.1 fL (ref 80.0–100.0)
Platelets: 316 10*3/uL (ref 150–400)
RBC: 4.53 MIL/uL (ref 3.87–5.11)
RDW: 15.4 % (ref 11.5–15.5)
WBC: 11.7 10*3/uL — ABNORMAL HIGH (ref 4.0–10.5)
nRBC: 0 % (ref 0.0–0.2)

## 2023-09-11 MED ORDER — OLANZAPINE 5 MG PO TBDP
5.0000 mg | ORAL_TABLET | Freq: Every day | ORAL | Status: DC
  Administered 2023-09-11: 5 mg via ORAL
  Filled 2023-09-11 (×2): qty 1

## 2023-09-11 MED ORDER — HALOPERIDOL LACTATE 5 MG/ML IJ SOLN
2.0000 mg | Freq: Four times a day (QID) | INTRAMUSCULAR | Status: DC | PRN
  Administered 2023-09-11 – 2023-09-13 (×4): 2 mg via INTRAVENOUS
  Filled 2023-09-11 (×4): qty 1

## 2023-09-11 MED ORDER — ALPRAZOLAM 0.25 MG PO TABS
0.2500 mg | ORAL_TABLET | Freq: Two times a day (BID) | ORAL | Status: DC
  Administered 2023-09-11: 0.25 mg via ORAL
  Filled 2023-09-11: qty 1

## 2023-09-11 MED ORDER — METOPROLOL SUCCINATE ER 25 MG PO TB24
25.0000 mg | ORAL_TABLET | Freq: Every day | ORAL | Status: DC
  Administered 2023-09-11: 25 mg via ORAL
  Filled 2023-09-11: qty 1

## 2023-09-11 MED ORDER — LORAZEPAM 2 MG/ML IJ SOLN
2.0000 mg | INTRAMUSCULAR | Status: DC | PRN
  Administered 2023-09-12 – 2023-09-13 (×3): 2 mg via INTRAVENOUS
  Filled 2023-09-11 (×3): qty 1

## 2023-09-11 MED ORDER — NICOTINE 14 MG/24HR TD PT24: 14.0000 mg | MEDICATED_PATCH | Freq: Every day | TRANSDERMAL | Status: DC | PRN

## 2023-09-11 NOTE — Progress Notes (Signed)
 [facility] ROUNDING NOTE Carla MCKEEVER ZOX:[id number]  DOB: Jun 10, 1945  DOA: 09/06/2023  PCP: Patient, [name]  09/11/2023,10:40 AM  LOS: 5 days    Code Status: DNR   from: Home current Dispo: Unclear   78 year old female Ongoing tobacco, COPD stage I HTN HLD primary hyperparathyroidism Community hospitalization 05/2022-08/2022 with hypercarbic respiratory failure metabolic encephalopathy- Tracheostomy placed  subsequent hospitalization 3/7-3 21 2025 again with respiratory failure requiring BiPAP  09/06/2023 presented to [facility] ED short of breath O2 sat 70s placed on nonrebreather 15 L BUN/creatinine 17/1.1 LFTs normal BNP 4500, troponin 308 VBG pH 7.2/73/31--- CXR left basilar infiltrate WBC 13 platelet 412--lactic acid 2.7- RSV/COVID/flu negative UA negative--BCx 2 collected 6/15 ICU transfer to [facility] failed BiPAP intubated hypotensive requiring Levophed  Echo = EF 35-45% with wall motion abnormality---- palliative medicine consulted 6/16 PICC line placed: 77 CVP 6 placed on heparin  gtt. 6/17 extubated off pressors and sent to floor   Plan  Acute COPD exacerbation//aspiration pneumonia severe sepsis on admission [peak lactic acid 4.9, peak WBC 24] Continue azithromycin  and ceftriaxone  since 6/15--placed on steroid taper prednisone  orally --some refusal of meds interventions such as albuterol , portending poor prognosis Expect further worsening  NSTEMI, HFrEF acute Small pericardial effusion--not felt concerning per prior discussions Cardiology consulted--has completed IV heparin , continue ASA 81-attempt GDMT metoprolol  25 XL if she will take Not currently on diuretics-monitor fluid status has been stable and administer meds as she will allow  A1c 6.0 on 06/02/2023 Eating 100% of meals-glucose from labs predominantly below 180-would hold coverage at this time  AKI on admission presumed secondary to hypotension/Cardio-renal Resolved to some degree--keep volumes even  Normocytic anemia  with drop during hospitalization presumed secondary to dilution Monitor trend for the dark stool would hemoccult  Metabolic encephalopathy-Acute with delirirum on admit At home is supposed to be on Seroquel  100 Remeron  30 for Xanax  0.25 twice daily but does not seem to be compliant There does not seem to be reliably take p.o. meds occasionally so was given Haldol  2 mg on 6/15 We will attempt to continue oral Seroquel  100, resume Xanax  0.25 twice daily if she allows  Goals of care--patient has had several hospitalizations over the past several years with acute respiratory failure including 1 menance in 1 to 3 months Family is contemplatin options and next steps and seem to want to take patient home eventually when all stable  DVT prophylaxis: Heparin   Status is: Inpatient Remains inpatient appropriate because:    As inpatient awaiting discussion with family      Subjective: Awake disoriented thinks that is at home-asking if she can go home Seems to be refusing meds occasionally  Objective + exam Vitals:   09/10/23 0800 09/10/23 1403 09/10/23 2008 09/11/23 1011  BP: 134/70 137/68 133/67 (!) 168/100  Pulse: 64 66 95 92  Resp: (!) 24 16 20 16   Temp: 98.1 F (36.7 C) 98.2 F (36.8 C)  97.7 F (36.5 C)  TempSrc:  Oral  Oral  SpO2: 97% 98% 94% 94%  Weight:      Height:       Filed Weights   09/08/23 1400 09/09/23 0500 09/10/23 0500  Weight: 42.3 kg 43.3 kg 42.7 kg    Examination: Pleasant but incoherent awake no distress On oxygen  Chest is clear posteriorly ROM is intact S1-S2 no murmur no rub no gallop On monitors PVCs with intermittent tachycardia Abdomen soft no rebound No lower extremity edema   Data Reviewed: reviewed   CBC  Component Value Date/Time   WBC 13.8 (H) 09/10/2023 0444   RBC 4.06 09/10/2023 0444   HGB 11.9 (L) 09/10/2023 0444   HGB 17.6 (H) 08/10/2020 0928   HCT 39.1 09/10/2023 0444   HCT 51.4 (H) 08/10/2020 0928   PLT 280 09/10/2023  0444   PLT 289 08/10/2020 0928   MCV 96.3 09/10/2023 0444   MCV 92 08/10/2020 0928   MCH 29.3 09/10/2023 0444   MCHC 30.4 09/10/2023 0444   RDW 15.3 09/10/2023 0444   RDW 12.9 08/10/2020 0928   LYMPHSABS 2.3 09/06/2023 2226   MONOABS 0.7 09/06/2023 2226   EOSABS 0.0 09/06/2023 2226   BASOSABS 0.1 09/06/2023 2226      Latest Ref Rng & Units 09/10/2023    4:44 AM 09/09/2023    3:08 PM 09/09/2023    3:32 AM  CMP  Glucose 70 - 99 mg/dL 130   865   BUN 8 - 23 mg/dL 39   52   Creatinine 7.84 - 1.00 mg/dL 6.96   2.95   Sodium 284 - 145 mmol/L 142  140  138   Potassium 3.5 - 5.1 mmol/L 4.6  4.9  3.8   Chloride 98 - 111 mmol/L 104   102   CO2 22 - 32 mmol/L 30   29   Calcium  8.9 - 10.3 mg/dL 9.4   8.2     Scheduled Meds:  aspirin   81 mg Oral Daily   Chlorhexidine  Gluconate Cloth  6 each Topical Daily   heparin  injection (subcutaneous)  5,000 Units Subcutaneous Q8H   metoprolol  succinate  25 mg Oral Daily   multivitamin with minerals  1 tablet Oral Daily   nicotine   21 mg Transdermal Daily   mouth rinse  15 mL Mouth Rinse Q2H   polyethylene glycol  17 g Oral Daily   [START ON 09/12/2023] predniSONE   30 mg Oral Q breakfast   Followed by   Cecily Cohen ON 09/13/2023] predniSONE   20 mg Oral Q breakfast   Followed by   Cecily Cohen ON 09/14/2023] predniSONE   10 mg Oral Q breakfast   sodium chloride  flush  10-40 mL Intracatheter Q12H   thiamine  100 mg Oral Daily   umeclidinium-vilanterol  1 puff Inhalation Daily   Continuous Infusions:  azithromycin  500 mg (09/10/23 1822)   cefTRIAXone  (ROCEPHIN )  IV 2 g (09/10/23 1716)    Time 46  Verlie Glisson, MD  [facility]

## 2023-09-11 NOTE — Progress Notes (Signed)
 Daily Progress Note   Patient Name: Carla [name]       Date: 09/11/2023 DOB: 1945-10-14  Age: 78 y.o. MRN#: [id number] Attending Physician: Samtani, Jai-Gurmukh, MD Primary Care Physician: Patient, [name] Admit Date: 09/06/2023  Reason for Consultation/Follow-up: Establishing goals of care  Subjective: Patient sleeping during my visit, did not wake as nurse reports agitation and confusion -patient unable to participate in goals of care conversations and waking her would likely only lead to agitation.  Family at bedside during my visit.  Length of Stay: 5  Current Medications: Scheduled Meds:   aspirin   81 mg Oral Daily   Chlorhexidine  Gluconate Cloth  6 each Topical Daily   heparin  injection (subcutaneous)  5,000 Units Subcutaneous Q8H   metoprolol  succinate  25 mg Oral Daily   multivitamin with minerals  1 tablet Oral Daily   nicotine   21 mg Transdermal Daily   OLANZapine  zydis  5 mg Oral Daily   mouth rinse  15 mL Mouth Rinse Q2H   polyethylene glycol  17 g Oral Daily   [START ON 09/12/2023] predniSONE   30 mg Oral Q breakfast   Followed by   Cecily Cohen ON 09/13/2023] predniSONE   20 mg Oral Q breakfast   Followed by   Cecily Cohen ON 09/14/2023] predniSONE   10 mg Oral Q breakfast   sodium chloride  flush  10-40 mL Intracatheter Q12H   thiamine  100 mg Oral Daily   umeclidinium-vilanterol  1 puff Inhalation Daily    Continuous Infusions:  azithromycin  500 mg (09/10/23 1822)   cefTRIAXone  (ROCEPHIN )  IV 2 g (09/10/23 1716)    PRN Meds: bisacodyl , haloperidol  lactate, LORazepam , nicotine , mouth rinse, sodium chloride  flush  Physical Exam Constitutional:      General: She is not in acute distress.    Appearance: She is ill-appearing.     Comments: Sleeping soundly  Pulmonary:     Effort:  Pulmonary effort is normal.     Comments: No oxygen  in place, patient continues to remove  Skin:    General: Skin is warm and dry.             Vital Signs: BP (!) 156/79 (BP Location: Left Arm)   Pulse 82   Temp 97.9 F (36.6 C) (Oral)   Resp 20   Ht 5' (1.524 m)   Wt 42.7 kg   SpO2 97%   BMI 18.38 kg/m  SpO2: SpO2: 97 % O2 Device: O2 Device: Nasal Cannula O2 Flow Rate: O2 Flow Rate (L/min): 3 L/min  Intake/output summary:  Intake/Output Summary (Last 24 hours) at 09/11/2023 1651 Last data filed at 09/11/2023 1033 Gross per 24 hour  Intake 590 ml  Output --  Net 590 ml   LBM: Last BM Date :  (PTA) Baseline Weight: Weight: 41.4 kg Most recent weight: Weight: 42.7 kg       Patient Active Problem List   Diagnosis Date Noted   On mechanically assisted ventilation (HCC) 09/08/2023   NSTEMI (non-ST elevated myocardial infarction) (HCC) 09/08/2023   Acute on chronic respiratory failure with hypoxia and hypercapnia (HCC) 09/07/2023   Elevated brain natriuretic peptide (BNP) level 09/07/2023   Elevated troponin 09/07/2023   Hyperglycemia 09/07/2023  Thrombocytosis 09/07/2023   Septic shock (HCC) 09/07/2023   CAP (community acquired pneumonia) 09/06/2023   Acute respiratory failure with hypoxia and hypercapnia (HCC) 05/31/2023   Major neurocognitive disorder (HCC) 05/31/2023   RSV (respiratory syncytial virus pneumonia) 05/31/2023   Acute on chronic respiratory failure with hypoxia (HCC) 05/30/2023   COPD with acute exacerbation (HCC) 05/30/2023   Hyponatremia 07/13/2022   Pressure injury of skin 07/13/2022   Fever 07/05/2022   Acute respiratory failure with hypercapnia (HCC) 07/05/2022   AKI (acute kidney injury) (HCC) 07/04/2022   Hypernatremia 07/04/2022   Encephalopathy acute 07/03/2022   Tracheostomy in place [facility]) 07/03/2022   Urinary retention 07/02/2022   Hypervolemia 07/02/2022   Protein-calorie malnutrition, severe (HCC) 06/22/2022   Tobacco abuse  06/12/2022   Anxiety 06/12/2022   Peripheral vascular disease (HCC) 06/12/2022   Dementia (HCC) 07/28/2020   Pain in both lower extremities 08/11/2019   Elevated hemoglobin (HCC) 08/11/2019   Memory loss 08/11/2019   Anorexia 08/11/2019   Serrated adenoma of colon 05/19/2019   Weight loss, abnormal 04/26/2019   Malnutrition of moderate degree (HCC) 01/25/2019   Nicotine  addiction 01/25/2019   Prediabetes 01/19/2019   Shortness of breath 09/21/2018   Anxiety attack 09/21/2018   Generalized weakness 09/21/2018   GAD (generalized anxiety disorder) 02/13/2014   Bilateral lower abdominal pain 07/07/2013   Seasonal allergies 03/30/2013   Cervical neck pain with evidence of disc disease 11/03/2012   Hearing loss 04/13/2012   Carotid bruit 04/13/2012   Back pain with radiation 10/15/2011   Emphysema with chronic bronchitis (HCC) 01/17/2011   Arteriosclerotic cardiovascular disease (ASCVD) 09/17/2009   PERIPHERAL VASCULAR DISEASE 09/17/2009   GASTROESOPHAGEAL REFLUX DISEASE 09/17/2009   Primary hyperparathyroidism (HCC) 01/20/2009   Vitamin D  deficiency 01/20/2009   Hypercalcemia 12/30/2008   Hyperlipidemia LDL goal <100 06/12/2007   Depression 06/12/2007   Essential hypertension 06/12/2007   Osteoporosis 06/12/2007    Palliative Care Assessment & Plan   HPI: 78 y/o female with PMH for COPD/Emphysema, HTN, Dementia (AAOx1 at baseline), Anxiety, Depression, GERD, Primary hyperparathyroidism, Vit D Def, HLD, Osteoporosis, and prior tracheostomy in '24. She was BIB EMS to [facility] for difficulty breathing, has subsequently been intubated.    Palliative care has seen Marily Shows in the past in April of '24 and March of '25. We have been consulted during this hospitalization for goals of care conversations.   Assessment: Follow-up today. Per RN, patient with severe agitation overnight and periods of agitation during the day today.  Required a dose of Haldol .  No family at bedside today. Yesterday  evening, palliative NP and Dr. Fulton Job with CCM were able to have conversation with family reviewing goals of care. Today I called patient's spouse [name].  We reviewed that his main goal is to have patient at home.  His daughter-in-law is moving in and she has a medical background so she will help provide care for the patient. We reviewed yesterday's conversation, specifically the question of any boundaries around Judy's care -such as rehospitalization, intubation, and what sort of support they may want in the home.  [name] tells me the family has not had a chance to discuss this yet and so he remains unsure.  At this point he is most focused on getting [place] home.  He is open to palliative care following up with him again tomorrow. I tried to schedule an in person meeting with him but he is not sure when he will be at the hospital.  Recommendations/Plan: Ongoing goals of  care discussions DNR but would except reintubation Family continues to consider options, hospice was previously introduced PMT will follow  Care plan was discussed with patient's spouse [name] and RN  Thank you for allowing the Palliative Medicine Team to assist in the care of this patient.   Total Time 40 minutes Prolonged Time Billed  no   Time spent includes: Detailed review of medical records (labs, imaging, vital signs), medically appropriate exam, discussion with treatment team, counseling and educating patient, family and/or staff, documenting clinical information, medication management and coordination of care.     *Please note that this is a verbal dictation therefore any spelling or grammatical errors are due to the Dragon [employer] One system interpretation.  Alvino Aye, DNP, [facility] Palliative Medicine Team Team Phone # [phone]  Pager [phone]

## 2023-09-11 NOTE — Progress Notes (Signed)
 Patient refused RN to apply nasal cannula for oxygen  supplementation. MD made aware, MD calling family. Patient asked RN to leave room. RN verbalize understanding.

## 2023-09-11 NOTE — Progress Notes (Signed)
 Notified [facility] of patient's refusal of labs, weight including heparin  sq. Not sure if patient has voided but patient would let us  check her let alone bladder scan

## 2023-09-11 NOTE — Progress Notes (Signed)
 Telemetry set alarming in patient's room, RN rounded and patient attempted to take off remaining tele leads, removed oxygen  and attempted to remove PICC under dressing. RN reorient back to center of bed. MD notified and made aware. MD order to keep tele off.

## 2023-09-11 NOTE — Plan of Care (Signed)
   Problem: Health Behavior/Discharge Planning: Goal: Ability to manage health-related needs will improve Outcome: Not Progressing

## 2023-09-11 NOTE — Progress Notes (Signed)
 Patient refused RN to obtained AM labs per MD order. Patient refused RN to perform assessment and administer AM meds after awoken from sleep. RN to re-attempt in 30 minutes.

## 2023-09-11 NOTE — Progress Notes (Signed)
 This RN attempted to get labs from this patient's PICC line. Patient told this RN that she wasn't going to let me give her anymore shots. Attempted to explain to patient that I was just drawing labs from her PICC line. She told me no and to get out of her room.  NT was in room at the time and would not let us  check her to see if she has voided, take vitals, or weigh her.  Will pass along to days.

## 2023-09-11 NOTE — Evaluation (Signed)
 Occupational Therapy Evaluation and Discharge Patient Details Name: Carla [name] MRN: [id number] DOB: 04/12/1945 Today's Date: 09/11/2023   History of Present Illness   Pt is a 78 y.o. female who presented to [facility] on 6/14 due to difficulty breathing and decreased O2 sat with family also reporting pt had not been eating well for a week. Pt was transferred to [facility] on 6/15 and admitted for COPD exacerbation with pt becoming hypotensive, requiring treatment. Pt intubated 6/15 to 6/17. PMH: dementia, HTN, COPD/emphysema, history of respiratory failure with extensive intubation, tracheostomy in May 2024, anxiety, depression, primary hyperparathyroidism, HLD, osteoporosis, De Quervain's disease, depression, GERD, heart disease, PVD, tobacco abuse     Clinical Impressions Per chart review, pt receives assistance from family for ADLs and IADLs and performs functional mobility without an AD in the home. During OT eval, pt able to spontaneously appropriately engage in self-directed familiar activities when items were set up for pt (i.e. self-feeding and washing hands with washcloth). Pt also able to spontaneously reposition herself in bed and come into long sitting in bed with demonstrated good balance. However, pt unable to follow any 1-step commands or to shift her attention from self-directed to therapist directed activities during evaluation. Due to this, pt is not appropriate for acute skilled OT services at this time. Please reconsult as appropriate. OT is signing off at this time.       If plan is discharge home, recommend the following:   A little help with walking and/or transfers;A lot of help with bathing/dressing/bathroom;Assistance with cooking/housework;Assistance with feeding;Direct supervision/assist for medications management;Direct supervision/assist for financial management;Assist for transportation;Help with stairs or ramp for entrance;Supervision due to cognitive status      Functional Status Assessment   Patient has had a recent decline in their functional status and/or demonstrates limited ability to make significant improvements in function in a reasonable and predictable amount of time     Equipment Recommendations   None recommended by OT     Recommendations for Other Services         Precautions/Restrictions   Precautions Precautions: Fall Recall of Precautions/Restrictions: Impaired Restrictions Weight Bearing Restrictions Per Provider Order: No     Mobility Bed Mobility Overal bed mobility: Needs Assistance Bed Mobility: Supine to Sit, Sit to Supine (long sit in the bed)     Supine to sit: Supervision Sit to supine: Supervision   General bed mobility comments: Pt spontaniously coming into long sit in the bed to reach for items on tray and to readjust in the bed and spontaniously return to Supine with HOB elevated. Pt unable to follow 1-step commands for further participation in bed mobility.    Transfers                   General transfer comment: Deferred for safety due to pt unable to follow 1-step commands and with impulsiveness with movement      Balance   Sitting-balance support: No upper extremity supported, Feet supported (in long sitting in bed) Sitting balance-Leahy Scale: Good Sitting balance - Comments: Pt able to spontaniously bring self into long sitting in the bed and maintain balance in dynamic and static sitting without UE support                                   ADL either performed or assessed with clinical judgement   ADL Overall ADL's : Needs assistance/impaired  Eating/Feeding: Set up;Sitting   Grooming: Wash/dry hands;Set up;Sitting                                 General ADL Comments: Pt spontaniously engaging in meal and appropriately self-feeding once meal was set up. Similarly, pt spontaniously washing hands when handed a washcloth then dropping wash  cloth on bed. Pt unable to follow commands to pick up washcloth or give washcloth to OT. However, pt unable to follow any 1-step commands or to shift attention from self-directed tasks to OT directed tasks.     Vision Patient Visual Report: Other (comment) (Pt unable to report) Vision Assessment?: No apparent visual deficits Additional Comments: Through clinical observation durign functional tasks. Pt unable to follow commands for vision screen.     Perception         Praxis         Pertinent Vitals/Pain Pain Assessment Pain Assessment: Faces Faces Pain Scale: No hurt Pain Intervention(s): Monitored during session     Extremity/Trunk Assessment Upper Extremity Assessment Upper Extremity Assessment: Difficult to assess due to impaired cognition (Pt demonstrating functional use of B UE WFL in self-directed tasks, but unable to follow commands for AROM or MMT and pulling away from attempts at AAROM/PROM.)   Lower Extremity Assessment Lower Extremity Assessment: Defer to PT evaluation       Communication Communication Communication: Impaired Factors Affecting Communication: Difficulty expressing self   Cognition Arousal: Lethargic, Alert (Pt initially lethargic but quickly alerting when asked if she wanted to eat lunch.) Behavior During Therapy: [facility] for tasks assessed/performed, Impulsive (Largely WFL with occasional impulsiveness) Cognition: History of cognitive impairments, Cognition impaired, No family/caregiver present to determine baseline   Orientation impairments: Place, Time, Situation (Pt not able to state name, but responding to her name) Awareness: Intellectual awareness impaired, Online awareness impaired Memory impairment (select all impairments): Short-term memory, Working Civil Service fast streamer, Conservation officer, historic buildings Attention impairment (select first level of impairment): Selective attention Executive functioning impairment (select all impairments): Organization,  Reasoning, Problem solving OT - Cognition Comments: Pt appropriately answering simple yes/no questions and providing 2-3 words answer to questions about her preferences (ex. Do you want to eat your banana? Do you like to watch TV? How is your sandwich?). Pt also appropriately spontaniously engaging in familiar tasks of eating and readjusting self in bed. However, pt unable to follow 1-step commands during session or participate in novel tasks not driven internally, such as, Take a tissue, when offered a tissue box or Touch your head.                 Following commands: Impaired Following commands impaired:  (Pt unable to follow any 1-step commands during session)     Cueing  General Comments   Cueing Techniques: Verbal cues;Gestural cues;Tactile cues;Visual cues      Exercises     Shoulder Instructions      Home Living Family/patient expects to be discharged to:: Private residence Living Arrangements: Children;Spouse/significant other (husband and son) Available Help at Discharge: Family;Available 24 hours/day Type of Home: House Home Access: Stairs to enter [employer] of Steps: 1   Home Layout: Two level;Bed/bath upstairs     Bathroom Shower/Tub: Chief Strategy Officer: Standard     Home Equipment: None   Additional Comments: Home set up per chart review as pt unable to report and no family/caregiver available at this time.  Prior Functioning/Environment Prior Level of Function : Independent/Modified Independent;Needs assist             Mobility Comments: Per chart review, pt is ambulatory without an AD at baseline ADLs Comments: Per chart review, family provides assistance with ADLs and IADLs    OT Problem List:     OT Treatment/Interventions:        OT Goals(Current goals can be found in the care plan section)   Acute Rehab OT Goals Patient Stated Goal: pt unable to state OT Goal Formulation: All assessment and  education complete, DC therapy Potential to Achieve Goals: Poor   OT Frequency:       Co-evaluation              AM-PAC OT 6 Clicks Daily Activity     Outcome Measure Help from another person eating meals?: A Little Help from another person taking care of personal grooming?: A Little Help from another person toileting, which includes using toliet, bedpan, or urinal?: A Lot Help from another person bathing (including washing, rinsing, drying)?: A Lot Help from another person to put on and taking off regular upper body clothing?: A Little Help from another person to put on and taking off regular lower body clothing?: A Lot 6 Click Score: 15   End of Session Nurse Communication: Other (comment) (Pt unable to follow 1-step commands or shift atttention from self-directed tasks. Pt set up for and eating lunch form bed level with alarm set. OT is signing off at this time.)  Activity Tolerance: Other (comment) (Pt limited by current cognitive level) Patient left: in bed;with call bell/phone within reach;with bed alarm set  OT Visit Diagnosis: Other symptoms and signs involving cognitive function;Other (comment) (decreased activity tolerance)                Time: 1440-1452 OT Time Calculation (min): 12 min Charges:  OT General Charges $OT Visit: 1 Visit OT Evaluation $OT Eval Low Complexity: 1 Low  Ronnell Coins., OTR/L, MA Acute Rehab [phone]   Walt Gunner 09/11/2023, 4:57 PM

## 2023-09-11 NOTE — Progress Notes (Signed)
 PT Cancellation Note  Patient Details Name: NINA MONDOR MRN: [id number] DOB: [birth year]   Cancelled Treatment:    Reason Eval/Treat Not Completed: Patient not medically ready.  RN asked for PT to hold.  Pt is not appropriate or ready for therapy.   09/11/2023  Nohemi Batters., PT Acute Rehabilitation Services [phone]  (office)   Durell Gilding Arly Salminen 09/11/2023, 5:16 PM

## 2023-09-11 NOTE — Progress Notes (Signed)
 Speech Language Pathology Treatment: Dysphagia  Patient Details Name: Carla [name] MRN: [id number] DOB: 1945/10/14 Today's Date: 09/11/2023 Time: 5284-1324 SLP Time Calculation (min) (ACUTE ONLY): 18 min  Assessment / Plan / Recommendation Clinical Impression  Patient seen by SLP for skilled treatment focused on dysphagia goals. She was asleep when SLP entered room but awakened easily to voice. Overall, she was much more calm and agreeable to assistance than previous date, even requesting assistance at times. Breakfast tray in room and patient agreeable to eating. SLP assisted with elevating HOB and minimal amount of assist opening containers, spreading butter but later on in meal, patient demonstrated ability to use utensils and open containers herself. Patient then able to feed self without difficulty. Congested sounding cough present prior to and during PO intake but SLP not highly suspicious of this being related to PO intake. SLP to s/o at this time and recommend continue with regular solids/finger foods and assistance with setup of meal trays.    HPI HPI: Patient is a 78 y.o. female with PMH: dementia, COPD/Emphysema, HTN, anxiety, depression, GERD, primary hyperparathyroidism, HLD, osteoporosis, h/o prolonged need for trach. She presented to the hospital on 09/06/2023 via EMS to [facility] for difficulty breathing and per EMS, patient's SpO2 was 70% on RA improving to 96% on 15L. Family reported poor PO intake for past week. In ED, she failed BiPAP and was subsequently intubated. CXR showed left basilar infiltrate. She was extubated on 6/17. She was recently admitted to hospital [facility] in March of 2025 for worsening SOB and in April of 2024 was hospitalized after being found unresponsive by family, was intubated 06/12/22, ultimately had tracheostomy placed 07/01/22 and self-decannulated 07/22/22.      SLP Plan  Discharge SLP treatment due to (comment);All goals met          Recommendations  Diet  recommendations: Regular;Thin liquid Liquids provided via: Cup;Straw Medication Administration: Other (Comment) (as tolerated) Compensations: Minimize environmental distractions Postural Changes and/or Swallow Maneuvers: Seated upright 90 degrees                  Oral care BID   Set up Supervision/Assistance Dysphagia, unspecified (R13.10)     Discharge SLP treatment due to (comment);All goals met    Jacqualine Mater, MA, CCC-SLP Speech Therapy

## 2023-09-11 NOTE — Progress Notes (Signed)
 Nutrition Follow-up  DOCUMENTATION CODES:   Underweight, Severe malnutrition in context of chronic illness  INTERVENTION:  Continue diet order per SLP recommendations; currently on finger foods Mighty Shake TID with meals, each supplement provides 330 kcals and 9 grams of protein Magic cup BID with meals, each supplement provides 290 kcal and 9 grams of protein Recommend increasing bowel regimen  NUTRITION DIAGNOSIS:   Severe Malnutrition related to chronic illness (COPD) as evidenced by severe muscle depletion, severe fat depletion. - remains applicable  GOAL:   Patient will meet greater than or equal to 90% of their needs - goal unmet, addressing via meals and nutrition supplements  MONITOR:   TF tolerance, I & O's, Vent status, Labs  REASON FOR ASSESSMENT:   Consult Enteral/tube feeding initiation and management  ASSESSMENT:   Pt with hx of dementia, COPD, PVD, GERD, HTN, and HLD presented to ED with breathing difficulty. Found to have a COPD exacerbation.  PMT consulted for GOC. Discussion ongoing but hopeful to return home.   Speech s/o yesterday and recommends continue with regular solids/finger foods and assistance with meal tray set up.   Pt notably continues with confusion and agitation. Discussed pt with RN yesterday who reports pt ate well for breakfast, noted 100% consumed. She enjoys sweets.  Automatic trays and nutrition supplements ordered for patient.   Pt's weights throughout admission have increased from 41.4 kg up to 46.4 kg. Question accuracy of last weight (67.2 kg) as this is significantly increased from prior weights.   Meal completions: 6/19: 100% breakfast  Medications: MVI, miralax , prednisone , thiamine  Labs:  BUN 31 Cr 1.06 GFR 54  Diet Order:   Diet Order             DIET FINGER FOODS Room service appropriate? No; Fluid consistency: Thin  Diet effective now                   EDUCATION NEEDS:   Not appropriate for  education at this time  Skin:  Skin Assessment: Reviewed RN Assessment  Last BM:  none documented throughout admission  Height:   Ht Readings from Last 1 Encounters:  09/08/23 5' (1.524 m)    Weight:   Wt Readings from Last 1 Encounters:  09/12/23 67.2 kg    Ideal Body Weight:  45.5 kg  BMI:  Body mass index is 28.93 kg/m.  Estimated Nutritional Needs:   Kcal:  1300-1500 kcal/d  Protein:  65-80g/d  Fluid:  1.5L/d  Rocklin Chute, RDN, LDN Clinical Nutrition See AMiON for contact information.

## 2023-09-11 NOTE — Progress Notes (Addendum)
 Progress Note  Patient Name: Carla [name] Date of Encounter: 09/11/2023 [facility] Cardiologist: None   Interval Summary   Patient appears to have become very agitated last night, requiring haldol   Patient is sleeping and still a bit irritated when awoken  She has been refusing medications, telemetry, vitals from nursing staff  Attempt to add back on BB she had previously, will see if she will take   Vital Signs Vitals:   09/10/23 0700 09/10/23 0800 09/10/23 1403 09/10/23 2008  BP: 137/74 134/70 137/68 133/67  Pulse: 65 64 66 95  Resp: 20 (!) 24 16 20   Temp: 98.2 F (36.8 C) 98.1 F (36.7 C) 98.2 F (36.8 C)   TempSrc:   Oral   SpO2: 98% 97% 98% 94%  Weight:      Height:        Intake/Output Summary (Last 24 hours) at 09/11/2023 0858 Last data filed at 09/11/2023 0010 Gross per 24 hour  Intake 388.73 ml  Output --  Net 388.73 ml      09/10/2023    5:00 AM 09/09/2023    5:00 AM 09/08/2023    2:00 PM  Last 3 Weights  Weight (lbs) 94 lb 2.2 oz 95 lb 7.4 oz 93 lb 4.1 oz  Weight (kg) 42.7 kg 43.3 kg 42.3 kg      Telemetry/ECG  N/A patient off telemetry  - Personally Reviewed  Physical Exam  GEN: No acute distress, chronically ill-appearing, irritated this morning .   Neck: No JVD Cardiac: RRR Respiratory: diminished breath sounds bilaterally. GI: Soft, nontender, non-distended  MS: No edema  Assessment & Plan  Carla [name] is a 78 y.o. female with a hx of COPD, hypertension, dementia (AAOx1 at baseline), anxiety, depression, aortic regurgitation, GERD, primary hyperparathyroidism, HLD, osteoporosis who is being seen for elevated troponin levels   NSTEMI Acute HFrEF, EF 35-40% previously 55-60% Patient presented in acute on chronic hypoxic and hypercarbic respiratory failure Troponin level 308 > 246 BNP > 4,500, repeated 4, 307 Previously required intubation and Levophed  but now extubated and off pressors 6/17 BP stable today Echo this  admission showed: EF 35-40%, LV RWMA, mild LVH, G2DD, mildly reduced RV function, small pericardial effusion, mild AR, dilated IVC  Renal function normalizing today, creatinine 0.93 Completed IV heparin  x 48 hours  Continue ASA 81 mg daily  BP remains stable -- adding back Toprol  25 mg daily, patient seems to be refusing most medications but will see if she will be agreeable to take this  Family met with Palliative care and discussed wanting to take the patient home but they were wanting to wait and talk as a family before making any decisions regarding goals of care with palliative/hospice care    Per primary Acute respiratory failure with hypoxia and hypercapnia due to COPD exacerbation   Aspiration PNA Tobacco abuse  Cachexia Dementia Septic shock, resolved Acute metabolic encephalopathy    For questions or updates, please contact [facility] Please consult [email or web address] for contact info under       Signed, Jiles Mote, PA-C   I have personally seen and examined the patient.  My HPI, Exam, and assessment and plan are below, independent of the NPP above.  78 yo F with Dementia and COPD.  Asymptomatic today.  AOX1.  Exam notable for  Gen: no distress, Chronically ill appearing   Cardiac: No Rubs or Gallops, systolic Murmur, RRR +2 radial pulses Respiratory: Coarse breath sounds GI: Soft, nontender,  non-distended  MS: No  edema Neuro:  At time of evaluation, alert and oriented to person/place/time/situation  Psych: Normal affect, patient feels ok  Tele: SR  In assessment and plan:  CAD with HF and Dementia - conservative therapy is appropriate; she is refusing some medications today - asymptomatic - ASA and succinate ordered; ultimately, my sense of the ICU goals of care were that when all the family was able to get here they would transition to focusing on comfort; no changes have been made to GOC since yesterday, heparin  stopped (to DVT PPX) - at this  point will not escalate care; if new sx or hypervolemia we will be happy to re-engage   Gloriann Larger, MD FASE [facility] Cardiologist [facility]  [address] [place], [place] [address] [phone]  11:01 AM

## 2023-09-12 ENCOUNTER — Inpatient Hospital Stay (HOSPITAL_COMMUNITY)

## 2023-09-12 DIAGNOSIS — G301 Alzheimer's disease with late onset: Secondary | ICD-10-CM | POA: Diagnosis not present

## 2023-09-12 DIAGNOSIS — J441 Chronic obstructive pulmonary disease with (acute) exacerbation: Secondary | ICD-10-CM | POA: Diagnosis not present

## 2023-09-12 DIAGNOSIS — J9621 Acute and chronic respiratory failure with hypoxia: Secondary | ICD-10-CM | POA: Diagnosis not present

## 2023-09-12 DIAGNOSIS — Z7189 Other specified counseling: Secondary | ICD-10-CM | POA: Diagnosis not present

## 2023-09-12 DIAGNOSIS — J189 Pneumonia, unspecified organism: Secondary | ICD-10-CM | POA: Diagnosis not present

## 2023-09-12 LAB — CBC WITH DIFFERENTIAL/PLATELET
Abs Immature Granulocytes: 0.08 10*3/uL — ABNORMAL HIGH (ref 0.00–0.07)
Basophils Absolute: 0 10*3/uL (ref 0.0–0.1)
Basophils Relative: 0 %
Eosinophils Absolute: 0 10*3/uL (ref 0.0–0.5)
Eosinophils Relative: 0 %
HCT: 42.8 % (ref 36.0–46.0)
Hemoglobin: 12.9 g/dL (ref 12.0–15.0)
Immature Granulocytes: 1 %
Lymphocytes Relative: 24 %
Lymphs Abs: 2.6 10*3/uL (ref 0.7–4.0)
MCH: 29.1 pg (ref 26.0–34.0)
MCHC: 30.1 g/dL (ref 30.0–36.0)
MCV: 96.6 fL (ref 80.0–100.0)
Monocytes Absolute: 0.7 10*3/uL (ref 0.1–1.0)
Monocytes Relative: 6 %
Neutro Abs: 7.7 10*3/uL (ref 1.7–7.7)
Neutrophils Relative %: 69 %
Platelets: 348 10*3/uL (ref 150–400)
RBC: 4.43 MIL/uL (ref 3.87–5.11)
RDW: 15.1 % (ref 11.5–15.5)
WBC: 11.1 10*3/uL — ABNORMAL HIGH (ref 4.0–10.5)
nRBC: 0 % (ref 0.0–0.2)

## 2023-09-12 LAB — BASIC METABOLIC PANEL WITH GFR
Anion gap: 7 (ref 5–15)
BUN: 31 mg/dL — ABNORMAL HIGH (ref 8–23)
CO2: 28 mmol/L (ref 22–32)
Calcium: 8.8 mg/dL — ABNORMAL LOW (ref 8.9–10.3)
Chloride: 105 mmol/L (ref 98–111)
Creatinine, Ser: 1.06 mg/dL — ABNORMAL HIGH (ref 0.44–1.00)
GFR, Estimated: 54 mL/min — ABNORMAL LOW (ref >60)
Glucose, Bld: 183 mg/dL — ABNORMAL HIGH (ref 70–99)
Potassium: 4.6 mmol/L (ref 3.5–5.1)
Sodium: 140 mmol/L (ref 135–145)

## 2023-09-12 MED ORDER — ASPIRIN 300 MG RE SUPP
300.0000 mg | Freq: Every day | RECTAL | Status: DC
  Administered 2023-09-17: 300 mg via RECTAL
  Filled 2023-09-12 (×6): qty 1

## 2023-09-12 MED ORDER — ALBUTEROL SULFATE (2.5 MG/3ML) 0.083% IN NEBU
INHALATION_SOLUTION | RESPIRATORY_TRACT | Status: AC
  Filled 2023-09-12: qty 3

## 2023-09-12 MED ORDER — SODIUM CHLORIDE 0.9 % IV SOLN
2.0000 g | INTRAVENOUS | Status: AC
  Administered 2023-09-12 – 2023-09-16 (×5): 2 g via INTRAVENOUS
  Filled 2023-09-12 (×5): qty 20

## 2023-09-12 MED ORDER — SODIUM CHLORIDE 0.9 % IV SOLN
500.0000 mg | INTRAVENOUS | Status: AC
  Administered 2023-09-12 – 2023-09-16 (×5): 500 mg via INTRAVENOUS
  Filled 2023-09-12 (×5): qty 5

## 2023-09-12 MED ORDER — ALBUTEROL SULFATE (2.5 MG/3ML) 0.083% IN NEBU: 2.5000 mg | INHALATION_SOLUTION | Freq: Once | RESPIRATORY_TRACT | Status: DC | PRN

## 2023-09-12 MED ORDER — METOPROLOL TARTRATE 5 MG/5ML IV SOLN
5.0000 mg | Freq: Four times a day (QID) | INTRAVENOUS | Status: DC
  Administered 2023-09-12 – 2023-09-16 (×16): 5 mg via INTRAVENOUS
  Filled 2023-09-12 (×16): qty 5

## 2023-09-12 MED ORDER — METHYLPREDNISOLONE SODIUM SUCC 40 MG IJ SOLR
40.0000 mg | Freq: Two times a day (BID) | INTRAMUSCULAR | Status: DC
  Administered 2023-09-12 – 2023-09-17 (×10): 40 mg via INTRAVENOUS
  Filled 2023-09-12 (×10): qty 1

## 2023-09-12 MED ORDER — ALBUTEROL SULFATE (2.5 MG/3ML) 0.083% IN NEBU
2.5000 mg | INHALATION_SOLUTION | RESPIRATORY_TRACT | Status: DC | PRN
  Administered 2023-09-14 – 2023-09-15 (×3): 2.5 mg via RESPIRATORY_TRACT
  Filled 2023-09-12 (×2): qty 3

## 2023-09-12 NOTE — Progress Notes (Addendum)
 Seen at bedside after Rapid called by bedside RN  Progressively SOB with refusal of therapy refusal of oxygen  as well as being awake the whole night  She seems comfortable on bipap but is pulling at it and pulling at the lines as well   Called husband next to did not get him-he finally called back-discussed briefly with him that patient is actively declining-he asks what palliative medicine would offer differently and hospice given his reticence to accept these recommendations yesterday-I explained to him that she probably has worse prognosis now that she has a heart issue in addition to the respiratory failure and hypercarbia and that overall her prognosis is less than about 2 weeks  I do believe that patient is stable currently and can stay on BiPAP but that there is higher chance of decline-I will ask palliative care to reengage as I think patient is hospice eligible and will probably not do well despite our best efforts  We will follow her x-ray and I will renew sitter order  Full note to follow  > 30 minutes, Adv care planning CRITICAL CARE Performed by: Verlie Glisson   Total critical care time: 15 minutes  Critical care time was exclusive of separately billable procedures and treating other patients.  Critical care was necessary to treat or prevent imminent or life-threatening deterioration.  Critical care was time spent personally by me on the following activities: development of treatment plan with patient and/or surrogate as well as nursing, discussions with consultants, evaluation of patient's response to treatment, examination of patient, obtaining history from patient or surrogate, ordering and performing treatments and interventions, ordering and review of laboratory studies, ordering and review of radiographic studies, pulse oximetry and re-evaluation of patient's condition.    Jai Shaniah Baltes, MD [facility] 8:39 AM

## 2023-09-12 NOTE — Plan of Care (Signed)
  Problem: Clinical Measurements: Goal: Will remain free from infection Outcome: Progressing   Problem: Health Behavior/Discharge Planning: Goal: Ability to manage health-related needs will improve Outcome: Not Progressing   Problem: Clinical Measurements: Goal: Diagnostic test results will improve Outcome: Not Progressing

## 2023-09-12 NOTE — Progress Notes (Signed)
 Patient removes oxygen , gets out of bed and half way up the hall by the time staff gets to room. Patient escorted back to room. Oxygen  back on. Haldol  given

## 2023-09-12 NOTE — Progress Notes (Signed)
 Patient agitated all night. Ativan  given but only kept her calm for a little bit. Patient continues to get out of the bed and by the time staff gets in the room she is out of the bed and around side of bed.   Another RN walked patient in the hall in hopes that patient would tire and go to sleep. Patient continued to get out of bed.   Checked on patient and found patient in respiratory distress. Asking for my help. Asked where she was. Placed patient on 3 L O2 to get her sats up from mid 80's. Patient has not had her oxygen  all night for this RN.   Backed patient down to 2 L

## 2023-09-12 NOTE — Significant Event (Signed)
 Rapid Response Event Note   Reason for Call :  Acute respiratory distress after taking her oxygen  off.  O2 sats 50%  placed on NRB  Initial Focused Assessment:  Patient is alert, and restless. She has increased work of breathing.  Lung sounds with scattered rhonchi/crackles/wheezes.  Heart tones regular.  BP 198/85  ST 122  RR 35-40  O2 sat 95% on NRB.    Interventions:  Albuterol  treatment Placed on Bipap per RT PCXR done Ativan  given IV  Plan of Care:     Event Summary:   MD Notified: Dr Haywood Lisle came to bedside Call Time: 0734   Arrival Time: 4034 End Time: 0900  Waldemar Guillaume, RN

## 2023-09-12 NOTE — Progress Notes (Signed)
 Daily Progress Note   Patient Name: Carla [name]       Date: 09/12/2023 DOB: [birth year]  Age: 78 y.o. MRN#: [id number] Attending Physician: Samtani, Jai-Gurmukh, MD Primary Care Physician: Patient, [name] Admit Date: 09/06/2023  Reason for Consultation/Follow-up: Establishing goals of care  Subjective: Patient sleeping during my visit - on bipap, did not wake as nurse reports agitation - recently received ativan  and confusion. No family at bedside.   Length of Stay: 6  Current Medications: Scheduled Meds:   aspirin   300 mg Rectal Daily   Chlorhexidine  Gluconate Cloth  6 each Topical Daily   heparin  injection (subcutaneous)  5,000 Units Subcutaneous Q8H   methylPREDNISolone  (SOLU-MEDROL ) injection  40 mg Intravenous Q12H   metoprolol  tartrate  5 mg Intravenous Q6H   multivitamin with minerals  1 tablet Oral Daily   nicotine   21 mg Transdermal Daily   mouth rinse  15 mL Mouth Rinse Q2H   sodium chloride  flush  10-40 mL Intracatheter Q12H   thiamine  100 mg Oral Daily   umeclidinium-vilanterol  1 puff Inhalation Daily    Continuous Infusions:  azithromycin      cefTRIAXone  (ROCEPHIN )  IV      PRN Meds: albuterol , haloperidol  lactate, LORazepam , nicotine , mouth rinse, sodium chloride  flush  Physical Exam Constitutional:      General: She is not in acute distress.    Appearance: She is ill-appearing.     Comments: Sleeping soundly  Pulmonary:     Comments: bipap  Skin:    General: Skin is warm and dry.             Vital Signs: BP 139/73   Pulse 62   Temp 98.4 F (36.9 C) (Oral)   Resp (!) 25   Ht 5' (1.524 m)   Wt 67.2 kg   SpO2 99%   BMI 28.93 kg/m  SpO2: SpO2: 99 % O2 Device: O2 Device: Nasal Cannula O2 Flow Rate: O2 Flow Rate (L/min): 3 L/min  Intake/output  summary:  Intake/Output Summary (Last 24 hours) at 09/12/2023 1758 Last data filed at 09/12/2023 0153 Gross per 24 hour  Intake 0 ml  Output --  Net 0 ml   LBM: Last BM Date :  (PTA) Baseline Weight: Weight: 41.4 kg Most recent weight: Weight: 67.2 kg       Patient Active Problem List   Diagnosis Date Noted   On mechanically assisted ventilation (HCC) 09/08/2023   NSTEMI (non-ST elevated myocardial infarction) (HCC) 09/08/2023   Acute on chronic respiratory failure with hypoxia and hypercapnia (HCC) 09/07/2023   Elevated brain natriuretic peptide (BNP) level 09/07/2023   Elevated troponin 09/07/2023   Hyperglycemia 09/07/2023   Thrombocytosis 09/07/2023   Septic shock (HCC) 09/07/2023   CAP (community acquired pneumonia) 09/06/2023   Acute respiratory failure with hypoxia and hypercapnia (HCC) 05/31/2023   Major neurocognitive disorder (HCC) 05/31/2023   RSV (respiratory syncytial virus pneumonia) 05/31/2023   Acute on chronic respiratory failure with hypoxia (HCC) 05/30/2023   COPD with acute exacerbation (HCC) 05/30/2023   Hyponatremia 07/13/2022   Pressure injury of skin 07/13/2022   Fever 07/05/2022   Acute respiratory failure with hypercapnia (HCC) 07/05/2022   AKI (acute  kidney injury) (HCC) 07/04/2022   Hypernatremia 07/04/2022   Encephalopathy acute 07/03/2022   Tracheostomy in place [facility]) 07/03/2022   Urinary retention 07/02/2022   Hypervolemia 07/02/2022   Protein-calorie malnutrition, severe (HCC) 06/22/2022   Tobacco abuse 06/12/2022   Anxiety 06/12/2022   Peripheral vascular disease (HCC) 06/12/2022   Dementia (HCC) 07/28/2020   Pain in both lower extremities 08/11/2019   Elevated hemoglobin (HCC) 08/11/2019   Memory loss 08/11/2019   Anorexia 08/11/2019   Serrated adenoma of colon 05/19/2019   Weight loss, abnormal 04/26/2019   Malnutrition of moderate degree (HCC) 01/25/2019   Nicotine  addiction 01/25/2019   Prediabetes 01/19/2019   Shortness of  breath 09/21/2018   Anxiety attack 09/21/2018   Generalized weakness 09/21/2018   GAD (generalized anxiety disorder) 02/13/2014   Bilateral lower abdominal pain 07/07/2013   Seasonal allergies 03/30/2013   Cervical neck pain with evidence of disc disease 11/03/2012   Hearing loss 04/13/2012   Carotid bruit 04/13/2012   Back pain with radiation 10/15/2011   Emphysema with chronic bronchitis (HCC) 01/17/2011   Arteriosclerotic cardiovascular disease (ASCVD) 09/17/2009   PERIPHERAL VASCULAR DISEASE 09/17/2009   GASTROESOPHAGEAL REFLUX DISEASE 09/17/2009   Primary hyperparathyroidism (HCC) 01/20/2009   Vitamin D  deficiency 01/20/2009   Hypercalcemia 12/30/2008   Hyperlipidemia LDL goal <100 06/12/2007   Depression 06/12/2007   Essential hypertension 06/12/2007   Osteoporosis 06/12/2007    Palliative Care Assessment & Plan   HPI: 78 y/o female with PMH for COPD/Emphysema, HTN, Dementia (AAOx1 at baseline), Anxiety, Depression, GERD, Primary hyperparathyroidism, Vit D Def, HLD, Osteoporosis, and prior tracheostomy in '24. She was BIB EMS to [facility] for difficulty breathing, has subsequently been intubated.    Palliative care has seen Marily Shows in the past in April of '24 and March of '25. We have been consulted during this hospitalization for goals of care conversations.   Assessment: Follow-up today. Received message this morning from Dr Haywood Lisle that respiratory status had declined and patient was placed on bipap. At bedside RN reports need for ativan  to keep patient calm.   I called patient's spouse Carla [name]. He had just had conversation with Dr. Haywood Lisle and understands severity of situation. I ask him if he can come to bedside for discussion but he shares he's unable to be at the hospital today. We discuss goals over the phone and he tells me he understands her prognosis is poor but wants to continue current care. We discuss how to proceed if her respiratory status were to worsen and discuss concern  about reintubation. At this point, he is open to reintubation. He agrees to more conversation and plans to come to hospital tomorrow AM for further discussion - agrees to meet with PMT at that time.   Recommendations/Plan: Ongoing goals of care discussions - PMT to reach out in AM to spouse DNR but would Accept reintubation Family continues to consider options, hospice was previously introduced PMT will follow  Care plan was discussed with patient's spouse Carla [name] and RN  Thank you for allowing the Palliative Medicine Team to assist in the care of this patient.   Total Time 35 minutes Prolonged Time Billed  no   Time spent includes: Detailed review of medical records (labs, imaging, vital signs), medically appropriate exam, discussion with treatment team, counseling and educating patient, family and/or staff, documenting clinical information, medication management and coordination of care.     *Please note that this is a verbal dictation therefore any spelling or grammatical errors are due to the  Dragon Medical One system interpretation.  Alvino Aye, DNP, [facility] Palliative Medicine Team Team Phone # [phone]  Pager [phone]

## 2023-09-12 NOTE — Progress Notes (Signed)
 PT Cancellation Note  Patient Details Name: Carla [name] MRN: [id number] DOB: Nov 26, 1945   Cancelled Treatment:    Reason Eval/Treat Not Completed: Medical issues which prohibited therapy; patient with respiratory distress on Bipap earlier today and just now calm on 4L O2.  RN requested to hold PT.  Will follow up.   Marley Simmers 09/12/2023, 5:03 PM Abigail Hoff, PT Acute Rehabilitation Services Office:[phone] 09/12/2023

## 2023-09-12 NOTE — Progress Notes (Signed)
 Pt was transitioned from BiPAP to 4L [place] and is tolerating fairly well. Pt combative after BiPAP removal pulling [place] off nose.Pt now sleep with [place] with no SOB noticed and is VSS at this time

## 2023-09-12 NOTE — Progress Notes (Signed)
 Daily Progress Note   Patient Name: Carla [name]       Date: 09/12/2023 DOB: 09-15-1945  Age: 78 y.o. MRN#: [id number] Attending Physician: Samtani, Jai-Gurmukh, MD Primary Care Physician: Patient, [name] Admit Date: 09/06/2023  Reason for Consultation/Follow-up: Establishing goals of care  Subjective: Patient sleeping during my visit, did not wake as nurse reports agitation and confusion -patient unable to participate in goals of care conversations and waking her would likely only lead to agitation.  Family not at bedside during my visit.  Length of Stay: 6  Current Medications: Scheduled Meds:   aspirin   300 mg Rectal Daily   Chlorhexidine  Gluconate Cloth  6 each Topical Daily   heparin  injection (subcutaneous)  5,000 Units Subcutaneous Q8H   methylPREDNISolone  (SOLU-MEDROL ) injection  40 mg Intravenous Q12H   metoprolol  tartrate  5 mg Intravenous Q6H   multivitamin with minerals  1 tablet Oral Daily   nicotine   21 mg Transdermal Daily   mouth rinse  15 mL Mouth Rinse Q2H   sodium chloride  flush  10-40 mL Intracatheter Q12H   thiamine  100 mg Oral Daily   umeclidinium-vilanterol  1 puff Inhalation Daily    Continuous Infusions:  azithromycin      cefTRIAXone  (ROCEPHIN )  IV      PRN Meds: albuterol , haloperidol  lactate, LORazepam , nicotine , mouth rinse, sodium chloride  flush  Physical Exam Constitutional:      General: She is not in acute distress.    Appearance: She is ill-appearing.     Comments: Sleeping soundly  Pulmonary:     Effort: Pulmonary effort is normal.     Comments: No oxygen  in place, patient continues to remove  Skin:    General: Skin is warm and dry.             Vital Signs: BP 139/73   Pulse 62   Temp 98.4 F (36.9 C) (Oral)   Resp (!) 25   Ht 5'  (1.524 m)   Wt 67.2 kg   SpO2 99%   BMI 28.93 kg/m  SpO2: SpO2: 99 % O2 Device: O2 Device: Nasal Cannula O2 Flow Rate: O2 Flow Rate (L/min): 3 L/min  Intake/output summary:  Intake/Output Summary (Last 24 hours) at 09/12/2023 1802 Last data filed at 09/12/2023 0153 Gross per 24 hour  Intake 0 ml  Output --  Net 0 ml   LBM: Last BM Date :  (PTA) Baseline Weight: Weight: 41.4 kg Most recent weight: Weight: 67.2 kg       Patient Active Problem List   Diagnosis Date Noted   On mechanically assisted ventilation (HCC) 09/08/2023   NSTEMI (non-ST elevated myocardial infarction) (HCC) 09/08/2023   Acute on chronic respiratory failure with hypoxia and hypercapnia (HCC) 09/07/2023   Elevated brain natriuretic peptide (BNP) level 09/07/2023   Elevated troponin 09/07/2023   Hyperglycemia 09/07/2023   Thrombocytosis 09/07/2023   Septic shock (HCC) 09/07/2023   CAP (community acquired pneumonia) 09/06/2023   Acute respiratory failure with hypoxia and hypercapnia (HCC) 05/31/2023   Major neurocognitive disorder (HCC) 05/31/2023   RSV (respiratory syncytial virus pneumonia) 05/31/2023   Acute on chronic respiratory failure with hypoxia (HCC) 05/30/2023   COPD with acute exacerbation (  HCC) 05/30/2023   Hyponatremia 07/13/2022   Pressure injury of skin 07/13/2022   Fever 07/05/2022   Acute respiratory failure with hypercapnia (HCC) 07/05/2022   AKI (acute kidney injury) (HCC) 07/04/2022   Hypernatremia 07/04/2022   Encephalopathy acute 07/03/2022   Tracheostomy in place [facility]) 07/03/2022   Urinary retention 07/02/2022   Hypervolemia 07/02/2022   Protein-calorie malnutrition, severe (HCC) 06/22/2022   Tobacco abuse 06/12/2022   Anxiety 06/12/2022   Peripheral vascular disease (HCC) 06/12/2022   Dementia (HCC) 07/28/2020   Pain in both lower extremities 08/11/2019   Elevated hemoglobin (HCC) 08/11/2019   Memory loss 08/11/2019   Anorexia 08/11/2019   Serrated adenoma of colon  05/19/2019   Weight loss, abnormal 04/26/2019   Malnutrition of moderate degree (HCC) 01/25/2019   Nicotine  addiction 01/25/2019   Prediabetes 01/19/2019   Shortness of breath 09/21/2018   Anxiety attack 09/21/2018   Generalized weakness 09/21/2018   GAD (generalized anxiety disorder) 02/13/2014   Bilateral lower abdominal pain 07/07/2013   Seasonal allergies 03/30/2013   Cervical neck pain with evidence of disc disease 11/03/2012   Hearing loss 04/13/2012   Carotid bruit 04/13/2012   Back pain with radiation 10/15/2011   Emphysema with chronic bronchitis (HCC) 01/17/2011   Arteriosclerotic cardiovascular disease (ASCVD) 09/17/2009   PERIPHERAL VASCULAR DISEASE 09/17/2009   GASTROESOPHAGEAL REFLUX DISEASE 09/17/2009   Primary hyperparathyroidism (HCC) 01/20/2009   Vitamin D  deficiency 01/20/2009   Hypercalcemia 12/30/2008   Hyperlipidemia LDL goal <100 06/12/2007   Depression 06/12/2007   Essential hypertension 06/12/2007   Osteoporosis 06/12/2007    Palliative Care Assessment & Plan   HPI: 78 y/o female with PMH for COPD/Emphysema, HTN, Dementia (AAOx1 at baseline), Anxiety, Depression, GERD, Primary hyperparathyroidism, Vit D Def, HLD, Osteoporosis, and prior tracheostomy in '24. She was BIB EMS to [facility] for difficulty breathing, has subsequently been intubated.    Palliative care has seen Carla [name] in the past in April of '24 and March of '25. We have been consulted during this hospitalization for goals of care conversations.   Assessment: Follow-up today. Per RN, patient with severe agitation overnight and periods of agitation during the day today.  Required a dose of Haldol .  No family at bedside today. Yesterday evening, palliative NP and Dr. Fulton Job with [facility] were able to have conversation with family reviewing goals of care. Today I called patient's spouse Carla [name].  We reviewed that his main goal is to have patient at home.  His daughter-in-law is moving in and she has a medical  background so she will help provide care for the patient. We reviewed yesterday's conversation, specifically the question of any boundaries around Carla [name]'s care -such as rehospitalization, intubation, and what sort of support they may want in the home.  Carla [name] tells me the family has not had a chance to discuss this yet and so he remains unsure.  At this point he is most focused on getting [place] home.  He is open to palliative care following up with him again tomorrow. I tried to schedule an in person meeting with him but he is not sure when he will be at the hospital.  Recommendations/Plan: Ongoing goals of care discussions DNR but would accept reintubation Family continues to consider options, hospice was previously introduced PMT will follow  Care plan was discussed with patient's spouse Carla [name] and RN  Thank you for allowing the Palliative Medicine Team to assist in the care of this patient.   Total Time 40 minutes Prolonged Time Billed  no  Time spent includes: Detailed review of medical records (labs, imaging, vital signs), medically appropriate exam, discussion with treatment team, counseling and educating patient, family and/or staff, documenting clinical information, medication management and coordination of care.     *Please note that this is a verbal dictation therefore any spelling or grammatical errors are due to the Dragon Medical One system interpretation.  Alvino Aye, DNP, [facility] Palliative Medicine Team Team Phone # [phone]  Pager [phone]

## 2023-09-12 NOTE — Progress Notes (Addendum)
 [facility] ROUNDING NOTE RMONI KEPLINGER ZOX:[id number]  DOB: Aug 11, 1945  DOA: 09/06/2023  PCP: Patient, [name]  09/12/2023,4:47 PM  LOS: 6 days    Code Status: DNR-contemplating intubation if worsens however--if worsens transfer to ICU   from: Home current Dispo: Guarded likely in the hospital death   78 year old female Ongoing tobacco, COPD stage I HTN HLD primary hyperparathyroidism Community hospitalization 05/2022-08/2022 with hypercarbic respiratory failure metabolic encephalopathy- Tracheostomy placed  subsequent hospitalization 3/7-3 21 2025 again with respiratory failure requiring BiPAP   09/06/2023 presented to [facility] ED short of breath O2 sat 70s placed on nonrebreather 15 L BUN/creatinine 17/1.1 LFTs normal BNP 4500, troponin 308 VBG pH 7.2/73/31--- CXR left basilar infiltrate WBC 13 platelet 412--lactic acid 2.7- RSV/COVID/flu negative UA negative--BCx 2 collected 6/15 ICU transfer to [facility] failed BiPAP intubated hypotensive requiring Levophed  Echo = EF 35-45% with wall motion abnormality---- palliative medicine consulted 6/16 PICC line placed: 77 CVP 6 placed on heparin  gtt. 6/17 extubated off pressors and sent to floor 6/20 significant worsening respiratory status requiring BiPAP   Plan  Acute respiratory failure secondary to noncompliance oxygen /therapies Underlying severe aspiration pneumonia peak lactic acid 4.9 WBC 24 Worsened late night 6/19 refused meds ambulatory refused oxygen  culminating in needing to be placed on BiPAP Detailed discussion with husband several times this morning-critical care has been consulted to revisit discussions as palliative indicates family may wish transfer to ICU and intubation if necessary--Will attempt to escalate only to BiPAP as need discussions about goals of care --- family conflicted and need more time to think through this Extending course of antibiotics azithromycin  and ceftriaxone  for 5 more days Currently on nasal cannula 4 L and  somnolent and has not been able to take some meds by mouth  COPD underlying continued smoker switch to IV Solu-Medrol  40 bid as not able to take p.o. Trial albuterol  2.5 as needed wheeze, Anoro Ellipta  NSTEMI HFrEF acute Not alert enough for GDMT-start metoprolol  5 every 6/every 6 as needed heart rate >120 Change aspirin  to 300 recommend  Normocytic anemia-dilutional  Metabolic encephalopathy acute delirium on admit Not awake alert enough to take any meds Continue Haldol  2 q 6 as needed severe agitation, lorazepam  2 mg every 4 as needed agitation required safety sitter as agitated and at risk of pulling out lines at risk of harming self  DVT prophylaxis: Heparin   Status is: Inpatient Remains inpatient appropriate because:   Requires goals of care and end-of-life discussions      Subjective: Somnolent this morning on BiPAP not responsive as per my prior note-now sleepy but on nasal cannula   Objective + exam Vitals:   09/12/23 0806 09/12/23 1000 09/12/23 1050 09/12/23 1513  BP: (!) 192/92 139/73    Pulse: (!) 119  80 62  Resp: (!) 40 (!) 29 (!) 32 (!) 25  Temp:   98.1 F (36.7 C) 98.4 F (36.9 C)  TempSrc:   Axillary Oral  SpO2: 100%  98% 99%  Weight:      Height:       Filed Weights   09/10/23 0500 09/12/23 0500 09/12/23 0638  Weight: 42.7 kg 46.4 kg 67.2 kg    Examination: Incoherent sleepy Chest some wheezes posterior laterally S1-S2 no murmur Abdomen soft no rebound No lower extremity edema Power cannot be tested  Data Reviewed: reviewed   CBC    Component Value Date/Time   WBC 11.1 (H) 09/12/2023 0555   RBC 4.43 09/12/2023 0555   HGB 12.9  09/12/2023 0555   HGB 17.6 (H) 08/10/2020 0928   HCT 42.8 09/12/2023 0555   HCT 51.4 (H) 08/10/2020 0928   PLT 348 09/12/2023 0555   PLT 289 08/10/2020 0928   MCV 96.6 09/12/2023 0555   MCV 92 08/10/2020 0928   MCH 29.1 09/12/2023 0555   MCHC 30.1 09/12/2023 0555   RDW 15.1 09/12/2023 0555   RDW 12.9  08/10/2020 0928   LYMPHSABS 2.6 09/12/2023 0555   MONOABS 0.7 09/12/2023 0555   EOSABS 0.0 09/12/2023 0555   BASOSABS 0.0 09/12/2023 0555      Latest Ref Rng & Units 09/12/2023    5:55 AM 09/11/2023   10:31 AM 09/10/2023    4:44 AM  CMP  Glucose 70 - 99 mg/dL 366  440  347   BUN 8 - 23 mg/dL 31  32  39   Creatinine 0.44 - 1.00 mg/dL 4.25  9.56  3.87   Sodium 135 - 145 mmol/L 140  143  142   Potassium 3.5 - 5.1 mmol/L 4.6  4.1  4.6   Chloride 98 - 111 mmol/L 105  102  104   CO2 22 - 32 mmol/L 28  31  30    Calcium  8.9 - 10.3 mg/dL 8.8  9.7  9.4     Scheduled Meds:  aspirin   81 mg Oral Daily   Chlorhexidine  Gluconate Cloth  6 each Topical Daily   heparin  injection (subcutaneous)  5,000 Units Subcutaneous Q8H   metoprolol  succinate  25 mg Oral Daily   multivitamin with minerals  1 tablet Oral Daily   nicotine   21 mg Transdermal Daily   OLANZapine  zydis  5 mg Oral Daily   mouth rinse  15 mL Mouth Rinse Q2H   polyethylene glycol  17 g Oral Daily   predniSONE   30 mg Oral Q breakfast   Followed by   Cecily Cohen ON 09/13/2023] predniSONE   20 mg Oral Q breakfast   Followed by   Cecily Cohen ON 09/14/2023] predniSONE   10 mg Oral Q breakfast   sodium chloride  flush  10-40 mL Intracatheter Q12H   thiamine  100 mg Oral Daily   umeclidinium-vilanterol  1 puff Inhalation Daily   Continuous Infusions:  Time 25  Jai-Gurmukh Marlow Berenguer, MD  [facility]

## 2023-09-13 DIAGNOSIS — J441 Chronic obstructive pulmonary disease with (acute) exacerbation: Secondary | ICD-10-CM | POA: Diagnosis not present

## 2023-09-13 DIAGNOSIS — J9621 Acute and chronic respiratory failure with hypoxia: Secondary | ICD-10-CM | POA: Diagnosis not present

## 2023-09-13 DIAGNOSIS — J9622 Acute and chronic respiratory failure with hypercapnia: Secondary | ICD-10-CM | POA: Diagnosis not present

## 2023-09-13 DIAGNOSIS — J189 Pneumonia, unspecified organism: Secondary | ICD-10-CM | POA: Diagnosis not present

## 2023-09-13 DIAGNOSIS — Z515 Encounter for palliative care: Secondary | ICD-10-CM | POA: Diagnosis not present

## 2023-09-13 LAB — CBC WITH DIFFERENTIAL/PLATELET
Abs Immature Granulocytes: 0.04 10*3/uL (ref 0.00–0.07)
Basophils Absolute: 0 10*3/uL (ref 0.0–0.1)
Basophils Relative: 0 %
Eosinophils Absolute: 0 10*3/uL (ref 0.0–0.5)
Eosinophils Relative: 0 %
HCT: 40.5 % (ref 36.0–46.0)
Hemoglobin: 12.5 g/dL (ref 12.0–15.0)
Immature Granulocytes: 1 %
Lymphocytes Relative: 10 %
Lymphs Abs: 0.8 10*3/uL (ref 0.7–4.0)
MCH: 29.6 pg (ref 26.0–34.0)
MCHC: 30.9 g/dL (ref 30.0–36.0)
MCV: 95.7 fL (ref 80.0–100.0)
Monocytes Absolute: 0.1 10*3/uL (ref 0.1–1.0)
Monocytes Relative: 1 %
Neutro Abs: 7.2 10*3/uL (ref 1.7–7.7)
Neutrophils Relative %: 88 %
Platelets: 274 10*3/uL (ref 150–400)
RBC: 4.23 MIL/uL (ref 3.87–5.11)
RDW: 15.2 % (ref 11.5–15.5)
WBC: 8.1 10*3/uL (ref 4.0–10.5)
nRBC: 0 % (ref 0.0–0.2)

## 2023-09-13 LAB — BASIC METABOLIC PANEL WITH GFR
Anion gap: 8 (ref 5–15)
BUN: 25 mg/dL — ABNORMAL HIGH (ref 8–23)
CO2: 28 mmol/L (ref 22–32)
Calcium: 9.2 mg/dL (ref 8.9–10.3)
Chloride: 105 mmol/L (ref 98–111)
Creatinine, Ser: 0.81 mg/dL (ref 0.44–1.00)
GFR, Estimated: >60 mL/min (ref >60)
Glucose, Bld: 157 mg/dL — ABNORMAL HIGH (ref 70–99)
Potassium: 4.7 mmol/L (ref 3.5–5.1)
Sodium: 141 mmol/L (ref 135–145)

## 2023-09-13 MED ORDER — LORAZEPAM 2 MG/ML IJ SOLN
2.0000 mg | INTRAMUSCULAR | Status: DC | PRN
  Administered 2023-09-13 – 2023-09-16 (×12): 2 mg via INTRAVENOUS
  Filled 2023-09-13 (×13): qty 1

## 2023-09-13 MED ORDER — FUROSEMIDE 10 MG/ML IJ SOLN
20.0000 mg | Freq: Once | INTRAMUSCULAR | Status: AC
  Administered 2023-09-13: 20 mg via INTRAVENOUS
  Filled 2023-09-13: qty 2

## 2023-09-13 MED ORDER — CARMEX CLASSIC LIP BALM EX OINT
TOPICAL_OINTMENT | CUTANEOUS | Status: DC | PRN
  Administered 2023-09-13: 1 via TOPICAL
  Filled 2023-09-13: qty 10

## 2023-09-13 MED ORDER — HALOPERIDOL LACTATE 5 MG/ML IJ SOLN
1.0000 mg | Freq: Three times a day (TID) | INTRAMUSCULAR | Status: DC | PRN
  Administered 2023-09-13 – 2023-09-14 (×2): 1 mg via INTRAVENOUS
  Filled 2023-09-13 (×2): qty 1

## 2023-09-13 NOTE — Progress Notes (Signed)
 PT Cancellation Note  Patient Details Name: Carla [name] MRN: [id number] DOB: 1945/09/16   Cancelled Treatment:    Reason Eval/Treat Not Completed: PT screened, no needs identified, will sign off  Attempted again with pt not arousable. Noted Dr. Raynard summary re; if worsens will transfer to ICU and pt getting scheduled haldol  and ativan . Noted pt adamantly refused PT previously and was not able to follow any commands during OT evaluation with OT signing off. Today is 4th day PT has tried to evaluate. Will sign-off at this time and will need re-order if pt becomes appropriate for PT/activity.    Macario RAMAN, PT Acute Rehabilitation Services  Office [phone]   [name] 09/13/2023, 2:54 PM

## 2023-09-13 NOTE — Plan of Care (Signed)

## 2023-09-13 NOTE — Progress Notes (Signed)
 Patient ID: JAMIA HOBAN, female   DOB: November 02, 1945, 78 y.o.   MRN: [id number]    Progress Note from the Palliative Medicine Team at [facility]   Patient Name: Carla [name]        Date: 09/13/2023 DOB: Nov 11, 1945  Age: 78 y.o. MRN#: [id number] Attending Physician: Samtani, Jai-Gurmukh, MD Primary Care Physician: Patient, [name] Admit Date: 09/06/2023   Reason for Consultation/Follow-up   Establishing Goals of Care   HPI/ Brief Hospital Review 78 y/o female with PMH for COPD/Emphysema, HTN, Dementia (AAOx1 at baseline), Anxiety, Depression, GERD, Primary hyperparathyroidism, Vit D Def, HLD, Osteoporosis, and prior tracheostomy in '24. She was BIB EMS to [facility] for difficulty breathing, has subsequently been intubated.    Palliative care has seen Dagoberto in the past in April of '24 and March of '25. We have been consulted during this hospitalization for goals of care conversations.   Family face treatment option decisions, advanced directive decisions and anticipatory care needs.    Subjective  Extensive chart review has been completed prior to meeting with patient/family  including labs, vital signs, imaging, progress/consult notes, orders, medications and available advance directive documents.    This NP assessed patient at the bedside as a follow up for palliative medicine needs and emotional support.  Patient appears weak and frail.  She is lethargic, she awakens to gentle touch and verbal stimuli.  No family at bedside  Attempted to contact husband x 2 left to phone messages, await callback.  Left message regarding the importance of continued conversation with the   medical providers regarding overall plan of care and treatment options,  ensuring decisions are within the context of the patients values and GOCs.  Questions and concerns addressed   Discussed with primary team and nursing staff   Time:  25 minutes  Detailed review of medical records ( labs, imaging,  vital signs), medically appropriate exam ( MS, skin, cardiac,  resp)   discussed with treatment team, counseling and education to patient, family, staff, documenting clinical information, medication management, coordination of care    Ronal Plants NP  Palliative Medicine Team Team Phone # [phone] Pager [phone]

## 2023-09-13 NOTE — Progress Notes (Signed)
 [facility] ROUNDING NOTE Carla [name] FMW:[id number]  DOB: [birth year]  DOA: 09/06/2023  PCP: Patient, [name]  09/13/2023,1:48 PM  LOS: 7 days    Code Status: DNR-contemplating intubation if worsens however--if worsens transfer to ICU   from: Home current Dispo: Guarded likely in the hospital death   78 year old female Ongoing tobacco, COPD stage I HTN HLD primary hyperparathyroidism Community hospitalization 05/2022-08/2022 with hypercarbic respiratory failure metabolic encephalopathy- Tracheostomy placed  subsequent hospitalization 3/7-3 21 2025 again with respiratory failure requiring BiPAP   09/06/2023 presented to [facility] ED short of breath O2 sat 70s placed on nonrebreather 15 L BUN/creatinine 17/1.1 LFTs normal BNP 4500, troponin 308 VBG pH 7.2/73/31--- CXR left basilar infiltrate WBC 13 platelet 412--lactic acid 2.7- RSV/COVID/flu negative UA negative--BCx 2 collected 6/15 ICU transfer to [facility] failed BiPAP intubated hypotensive requiring Levophed  Echo = EF 35-45% with wall motion abnormality---- palliative medicine consulted 6/16 PICC line placed: 77 CVP 6 placed on heparin  gtt. 6/17 extubated off pressors and sent to floor 6/20 significant worsening respiratory status requiring BiPAP-palliative reconsulted,    Plan  Acute hypoxic respiratory failure-noncompliance with regimen with severe hypoxia 6/19 Very poor overall prognosis-x-rayRead from 6/19 shows worsening pneumonia versus pulmonary edema--to me more atelectasis--white count is down to 8.1 extended azithromycin  500 and ceftriaxone  2g IV but high risk for decompensation worsening and recurrence Not safe to eat-n.p.o. for now until further discussions with palliative care critical care made aware that family (husband) wanting to transfer back to ICU if decompensates-hopeful to avoid intubation -use NIPPV if needed See below discussion  COPD underlying smoker Continue Solu-Medrol  every 12, as needed inhaler albuterol  2.5Q4,  Anoro Ellipta  1 daily-no overt wheeze Continue NicoDerm patch 21 mcg  NSTEMI HFrEF Oral meds held as sleepy, rectal aspirin  300 Metoprolol  IV every 6 heart rate >120 Adding small dose of IV Lasix  20 based on x-ray above-recheck clinical status in a.m.  Metabolic encephalopathy with acute delirium in the setting of probable underlying dementia Very poor prognosis overall multiple discussions held with family about end-of-life care and discussion has required BiPAP several times between last year and this year and has been recommended to pursue palliative measures as an outpatient For now continue Ativan  2 every 4 as needed anxiety agitation, Haldol  be cut back to 1 mg every 8 as needed to allow for awake state Safety sitter to be reordered if confused or agitated as was pulling at lines earlier in hospital stayagitation required safety sitter as agitated and at risk of pulling out lines at risk of harming self  DVT prophylaxis: Heparin   Status is: Inpatient Remains inpatient appropriate because:   Requires goals of care and end-of-life discussions      Subjective: Somnolent --- somewhat arousable but falls back to sleep Has not recently been given Haldol  or Ativan  so we may have to lighten these PRNs to see how she does  Objective + exam Vitals:   09/12/23 2300 09/13/23 0300 09/13/23 0800 09/13/23 1158  BP: (!) 154/76 (!) 146/77 135/66 137/79  Pulse: 77 79 (!) 58 65  Resp: 20 20 20 19   Temp: 97.7 F (36.5 C) 97.9 F (36.6 C) 98.7 F (37.1 C) 98 F (36.7 C)  TempSrc: Axillary Axillary Oral Oral  SpO2: 100% 94% 99% 98%  Weight:      Height:       Filed Weights   09/10/23 0500 09/12/23 0500 09/12/23 0638  Weight: 42.7 kg 46.4 kg 67.2 kg    Examination:  Incoherent  somewhat sleepy, on nasal oxygen  2 to 3 L Chest clear no wheeze rales rhonchi Abdomen soft no rebound PureWick in place No lower extremity edema Not awake enough to interact with neuroexam  Data  Reviewed: reviewed   CBC    Component Value Date/Time   WBC 8.1 09/13/2023 0354   RBC 4.23 09/13/2023 0354   HGB 12.5 09/13/2023 0354   HGB 17.6 (H) 08/10/2020 0928   HCT 40.5 09/13/2023 0354   HCT 51.4 (H) 08/10/2020 0928   PLT 274 09/13/2023 0354   PLT 289 08/10/2020 0928   MCV 95.7 09/13/2023 0354   MCV 92 08/10/2020 0928   MCH 29.6 09/13/2023 0354   MCHC 30.9 09/13/2023 0354   RDW 15.2 09/13/2023 0354   RDW 12.9 08/10/2020 0928   LYMPHSABS 0.8 09/13/2023 0354   MONOABS 0.1 09/13/2023 0354   EOSABS 0.0 09/13/2023 0354   BASOSABS 0.0 09/13/2023 0354      Latest Ref Rng & Units 09/13/2023    3:54 AM 09/12/2023    5:55 AM 09/11/2023   10:31 AM  CMP  Glucose 70 - 99 mg/dL 842  816  872   BUN 8 - 23 mg/dL 25  31  32   Creatinine 0.44 - 1.00 mg/dL 9.18  8.93  9.00   Sodium 135 - 145 mmol/L 141  140  143   Potassium 3.5 - 5.1 mmol/L 4.7  4.6  4.1   Chloride 98 - 111 mmol/L 105  105  102   CO2 22 - 32 mmol/L 28  28  31    Calcium  8.9 - 10.3 mg/dL 9.2  8.8  9.7     Scheduled Meds:  aspirin   300 mg Rectal Daily   Chlorhexidine  Gluconate Cloth  6 each Topical Daily   furosemide   20 mg Intravenous Once   heparin  injection (subcutaneous)  5,000 Units Subcutaneous Q8H   methylPREDNISolone  (SOLU-MEDROL ) injection  40 mg Intravenous Q12H   metoprolol  tartrate  5 mg Intravenous Q6H   multivitamin with minerals  1 tablet Oral Daily   nicotine   21 mg Transdermal Daily   mouth rinse  15 mL Mouth Rinse Q2H   sodium chloride  flush  10-40 mL Intracatheter Q12H   umeclidinium-vilanterol  1 puff Inhalation Daily   Continuous Infusions:  azithromycin  Stopped (09/12/23 2013)   cefTRIAXone  (ROCEPHIN )  IV Stopped (09/12/23 1938)    Time 25  Colen Grimes, MD  [facility]

## 2023-09-13 NOTE — Progress Notes (Signed)
 PT Cancellation Note  Patient Details Name: NICOLE DEFINO MRN: [id number] DOB: [birth year]   Cancelled Treatment:    Reason Eval/Treat Not Completed: Fatigue/lethargy limiting ability to participate  Patient sleeping soundly and did not easily arouse. Noted periods of agitation and refusing activity. Will try to catch pt when she is awake to complete PT eval.    Macario RAMAN, PT Acute Rehabilitation Services  Office [phone]  [name] 09/13/2023, 10:30 AM

## 2023-09-13 NOTE — Progress Notes (Signed)
  Interdisciplinary Goals of Care Family Meeting   Date carried out: 09/13/2023  Location of the meeting: Unit  Member's involved: Physician and Bedside Registered Nurse  Durable Power of Attorney or acting medical decision maker:   NONE    Called to speak with husband Doug--didn't get him on phone. Called to speak with next contact--granddaguther Alan [phone] no response  Very high risk for decompensation--await further palliative medicine input and frank direct discussion of her terminal trajectory with family.    Husband doug [phone] have been cautioned previously that with her refusals to participate with care, her non compliance on therapies and her confusion that she is at very high risk for poor outcome.  Would try everything short of intubation including PPV if she decompensates, as we have not been abel to have further discussion with Georgette despite myself and palliative medicine calling him numerous times today  >15 min  Jai Morgen Ritacco, MD [facility] 6:09 PM  Code status:   Code Status: Do not attempt resuscitation (DNR) PRE-ARREST INTERVENTIONS DESIRED   Disposition: Continue current acute care  Time spent for the meeting: 22    Colen Grimes, MD  09/13/2023, 6:10 PM

## 2023-09-13 NOTE — Plan of Care (Signed)
  Problem: Education: Goal: Knowledge of General Education information will improve Description: Including pain rating scale, medication(s)/side effects and non-pharmacologic comfort measures Outcome: Not Progressing   Problem: Health Behavior/Discharge Planning: Goal: Ability to manage health-related needs will improve Outcome: Not Progressing   Problem: Coping: Goal: Level of anxiety will decrease Outcome: Not Progressing   Problem: Skin Integrity: Goal: Risk for impaired skin integrity will decrease Outcome: Not Progressing

## 2023-09-14 DIAGNOSIS — J189 Pneumonia, unspecified organism: Secondary | ICD-10-CM | POA: Diagnosis not present

## 2023-09-14 LAB — CBC WITH DIFFERENTIAL/PLATELET
Abs Immature Granulocytes: 0.03 10*3/uL (ref 0.00–0.07)
Basophils Absolute: 0 10*3/uL (ref 0.0–0.1)
Basophils Relative: 0 %
Eosinophils Absolute: 0 10*3/uL (ref 0.0–0.5)
Eosinophils Relative: 0 %
HCT: 42.7 % (ref 36.0–46.0)
Hemoglobin: 13 g/dL (ref 12.0–15.0)
Immature Granulocytes: 0 %
Lymphocytes Relative: 13 %
Lymphs Abs: 1.1 10*3/uL (ref 0.7–4.0)
MCH: 29.2 pg (ref 26.0–34.0)
MCHC: 30.4 g/dL (ref 30.0–36.0)
MCV: 96 fL (ref 80.0–100.0)
Monocytes Absolute: 0.4 10*3/uL (ref 0.1–1.0)
Monocytes Relative: 5 %
Neutro Abs: 6.8 10*3/uL (ref 1.7–7.7)
Neutrophils Relative %: 82 %
Platelets: 285 10*3/uL (ref 150–400)
RBC: 4.45 MIL/uL (ref 3.87–5.11)
RDW: 15.2 % (ref 11.5–15.5)
WBC: 8.3 10*3/uL (ref 4.0–10.5)
nRBC: 0 % (ref 0.0–0.2)

## 2023-09-14 LAB — BASIC METABOLIC PANEL WITH GFR
Anion gap: 3 — ABNORMAL LOW (ref 5–15)
BUN: 28 mg/dL — ABNORMAL HIGH (ref 8–23)
CO2: 31 mmol/L (ref 22–32)
Calcium: 8.9 mg/dL (ref 8.9–10.3)
Chloride: 106 mmol/L (ref 98–111)
Creatinine, Ser: 0.8 mg/dL (ref 0.44–1.00)
GFR, Estimated: >60 mL/min (ref >60)
Glucose, Bld: 146 mg/dL — ABNORMAL HIGH (ref 70–99)
Potassium: 3.9 mmol/L (ref 3.5–5.1)
Sodium: 140 mmol/L (ref 135–145)

## 2023-09-14 NOTE — Plan of Care (Signed)
  Problem: Education: Goal: Knowledge of General Education information will improve Description: Including pain rating scale, medication(s)/side effects and non-pharmacologic comfort measures Outcome: Progressing   Problem: Education: Goal: Knowledge of General Education information will improve Description: Including pain rating scale, medication(s)/side effects and non-pharmacologic comfort measures Outcome: Progressing   Problem: Health Behavior/Discharge Planning: Goal: Ability to manage health-related needs will improve Outcome: Progressing   Problem: Clinical Measurements: Goal: Ability to maintain clinical measurements within normal limits will improve Outcome: Progressing Goal: Will remain free from infection Outcome: Progressing Goal: Diagnostic test results will improve Outcome: Progressing   

## 2023-09-14 NOTE — Progress Notes (Signed)
 [facility] ROUNDING NOTE Carla [name] FMW:[id number]  DOB: 01-06-1946  DOA: 09/06/2023  PCP: Patient, [name]  09/14/2023,3:57 PM  LOS: 8 days    Code Status: DNR-contemplating intubation if worsens however--if worsens transfer to ICU   from: Home current Dispo: Guarded likely in the hospital death   78 year old female Ongoing tobacco, COPD stage I HTN HLD primary hyperparathyroidism Community hospitalization 05/2022-08/2022 with hypercarbic respiratory failure metabolic encephalopathy- Tracheostomy placed  subsequent hospitalization 3/7-3 21 2025 again with respiratory failure requiring BiPAP   09/06/2023 presented to [facility] ED short of breath O2 sat 70s placed on nonrebreather 15 L BUN/creatinine 17/1.1 LFTs normal BNP 4500, troponin 308 VBG pH 7.2/73/31--- CXR left basilar infiltrate WBC 13 platelet 412--lactic acid 2.7- RSV/COVID/flu negative UA negative--BCx 2 collected 6/15 ICU transfer to [facility] failed BiPAP intubated hypotensive requiring Levophed  Echo = EF 35-45% with wall motion abnormality---- palliative medicine consulted 6/16 PICC line placed: 77 CVP 6 placed on heparin  gtt. 6/17 extubated off pressors and sent to floor 6/20 significant worsening respiratory status requiring BiPAP-palliative reconsulted   Plan  Acute hypoxic respiratory failure-noncompliance with regimen with severe hypoxia 6/19 Very poor overall prognosis--decompensated 6/19 but stabilized on Nasal Canula extended azithromycin  500 and ceftriaxone  2g IV but high risk for decompensation worsening and recurrence Not safe to eat-n.p.o. for now---will have to discuss nutrition in the next 24-48 hr given NPO state and poor likely swallow/unsafe to eat critical care made aware that family (husband) wanting to transfer back to ICU if decompensates-hopeful to avoid intubation -use NIPPV if needed Periodic CXR--so far oxygenation has stabilized from prior  See below discussion  COPD underlying smoker Continue  Solu-Medrol  every 12, as needed inhaler albuterol  2.5Q4, Anoro Ellipta  1 daily-no overt wheeze Continue NicoDerm patch 21 mcg  NSTEMI HFrEF Oral meds held as sleepy, rectal aspirin  300 Metoprolol  IV every 6 heart rate >120 Adding small dose of IV Lasix  20 based on x-ray   Metabolic encephalopathy with acute delirium in the setting of probable underlying dementia Very poor prognosis overall multiple discussions held with family about end-of-life care and discussion has required BiPAP several times between last year and this year and has been recommended to pursue palliative measures as an outpatient ontinue Ativan  2 every 4 as needed anxiety agitation, Haldol  be cut back to 1 mg every 8 as needed to allow for awake state Safety sitter to be reordered required safety sitter as agitated and at risk of pulling out lines at risk of harming self  Doug and I spoke---family coming tomorrow to discuss with Pallaitve tomorrow   DVT prophylaxis: Heparin   Status is: Inpatient Remains inpatient appropriate because:   Requires goals of care and end-of-life discussions      Subjective: Somnolent ---unsafe to eat.  Swatting at RN overnight More awake at night per nursing I am unable to get ROS on her  Objective + exam Vitals:   09/14/23 0250 09/14/23 0500 09/14/23 0800 09/14/23 1200  BP: (!) 158/83 131/63 (!) 159/70 (!) 162/71  Pulse: 77 (!) 55 (!) 58 63  Resp: 20 20 20 20   Temp: 97.6 F (36.4 C)  98.1 F (36.7 C) 97.7 F (36.5 C)  TempSrc: Oral  Oral Oral  SpO2: 98% 100% 96% 100%  Weight:  45.6 kg    Height:       Filed Weights   09/12/23 0500 09/12/23 0638 09/14/23 0500  Weight: 46.4 kg 67.2 kg 45.6 kg    Examination:  Incoherent arouses some then  falls back to sleep, on nasal oxygen  this am Ant chest clear Abdomen soft no rebound PureWick in place No lower extremity edema Not awake enough to interact with neuroexam  Data Reviewed: reviewed   CBC    Component Value  Date/Time   WBC 8.3 09/14/2023 0613   RBC 4.45 09/14/2023 0613   HGB 13.0 09/14/2023 0613   HGB 17.6 (H) 08/10/2020 0928   HCT 42.7 09/14/2023 0613   HCT 51.4 (H) 08/10/2020 0928   PLT 285 09/14/2023 0613   PLT 289 08/10/2020 0928   MCV 96.0 09/14/2023 0613   MCV 92 08/10/2020 0928   MCH 29.2 09/14/2023 0613   MCHC 30.4 09/14/2023 0613   RDW 15.2 09/14/2023 0613   RDW 12.9 08/10/2020 0928   LYMPHSABS 1.1 09/14/2023 0613   MONOABS 0.4 09/14/2023 0613   EOSABS 0.0 09/14/2023 0613   BASOSABS 0.0 09/14/2023 0613      Latest Ref Rng & Units 09/14/2023    6:13 AM 09/13/2023    3:54 AM 09/12/2023    5:55 AM  CMP  Glucose 70 - 99 mg/dL 853  842  816   BUN 8 - 23 mg/dL 28  25  31    Creatinine 0.44 - 1.00 mg/dL 9.19  9.18  8.93   Sodium 135 - 145 mmol/L 140  141  140   Potassium 3.5 - 5.1 mmol/L 3.9  4.7  4.6   Chloride 98 - 111 mmol/L 106  105  105   CO2 22 - 32 mmol/L 31  28  28    Calcium  8.9 - 10.3 mg/dL 8.9  9.2  8.8     Scheduled Meds:  aspirin   300 mg Rectal Daily   Chlorhexidine  Gluconate Cloth  6 each Topical Daily   heparin  injection (subcutaneous)  5,000 Units Subcutaneous Q8H   methylPREDNISolone  (SOLU-MEDROL ) injection  40 mg Intravenous Q12H   metoprolol  tartrate  5 mg Intravenous Q6H   multivitamin with minerals  1 tablet Oral Daily   nicotine   21 mg Transdermal Daily   mouth rinse  15 mL Mouth Rinse Q2H   sodium chloride  flush  10-40 mL Intracatheter Q12H   umeclidinium-vilanterol  1 puff Inhalation Daily   Continuous Infusions:  azithromycin  500 mg (09/13/23 1804)   cefTRIAXone  (ROCEPHIN )  IV Stopped (09/13/23 1754)    Time 15  Colen Grimes, MD  [facility]

## 2023-09-15 DIAGNOSIS — G301 Alzheimer's disease with late onset: Secondary | ICD-10-CM | POA: Diagnosis not present

## 2023-09-15 DIAGNOSIS — Z515 Encounter for palliative care: Secondary | ICD-10-CM | POA: Diagnosis not present

## 2023-09-15 DIAGNOSIS — J9622 Acute and chronic respiratory failure with hypercapnia: Secondary | ICD-10-CM | POA: Diagnosis not present

## 2023-09-15 DIAGNOSIS — J189 Pneumonia, unspecified organism: Secondary | ICD-10-CM | POA: Diagnosis not present

## 2023-09-15 DIAGNOSIS — J9621 Acute and chronic respiratory failure with hypoxia: Secondary | ICD-10-CM | POA: Diagnosis not present

## 2023-09-15 LAB — BASIC METABOLIC PANEL WITH GFR
Anion gap: 8 (ref 5–15)
BUN: 32 mg/dL — ABNORMAL HIGH (ref 8–23)
CO2: 30 mmol/L (ref 22–32)
Calcium: 9.6 mg/dL (ref 8.9–10.3)
Chloride: 105 mmol/L (ref 98–111)
Creatinine, Ser: 0.93 mg/dL (ref 0.44–1.00)
GFR, Estimated: >60 mL/min (ref >60)
Glucose, Bld: 164 mg/dL — ABNORMAL HIGH (ref 70–99)
Potassium: 4.1 mmol/L (ref 3.5–5.1)
Sodium: 143 mmol/L (ref 135–145)

## 2023-09-15 LAB — CBC WITH DIFFERENTIAL/PLATELET
Abs Immature Granulocytes: 0.03 10*3/uL (ref 0.00–0.07)
Basophils Absolute: 0 10*3/uL (ref 0.0–0.1)
Basophils Relative: 0 %
Eosinophils Absolute: 0 10*3/uL (ref 0.0–0.5)
Eosinophils Relative: 0 %
HCT: 46.2 % — ABNORMAL HIGH (ref 36.0–46.0)
Hemoglobin: 14 g/dL (ref 12.0–15.0)
Immature Granulocytes: 0 %
Lymphocytes Relative: 17 %
Lymphs Abs: 1.6 10*3/uL (ref 0.7–4.0)
MCH: 29.7 pg (ref 26.0–34.0)
MCHC: 30.3 g/dL (ref 30.0–36.0)
MCV: 97.9 fL (ref 80.0–100.0)
Monocytes Absolute: 0.6 10*3/uL (ref 0.1–1.0)
Monocytes Relative: 6 %
Neutro Abs: 7 10*3/uL (ref 1.7–7.7)
Neutrophils Relative %: 77 %
Platelets: 302 10*3/uL (ref 150–400)
RBC: 4.72 MIL/uL (ref 3.87–5.11)
RDW: 15.3 % (ref 11.5–15.5)
WBC: 9.2 10*3/uL (ref 4.0–10.5)
nRBC: 0 % (ref 0.0–0.2)

## 2023-09-15 NOTE — Plan of Care (Signed)

## 2023-09-15 NOTE — Progress Notes (Addendum)
 Patient ID: Carla [name], female   DOB: January 25, 1946, 78 y.o.   MRN: [id number]    Progress Note from the Palliative Medicine Team at [facility]   Patient Name: Carla [name]        Date: 09/15/2023 DOB: 08-22-1945  Age: 78 y.o. MRN#: [id number] Attending Physician: Samtani, Jai-Gurmukh, MD Primary Care Physician: Patient, [name] Admit Date: 09/06/2023   Reason for Consultation/Follow-up   Establishing Goals of Care   HPI/ Brief Hospital Review 78 YO female with PMH for COPD/Emphysema, HTN, Dementia (AAOx1 at baseline), Anxiety, Depression, GERD, Primary hyperparathyroidism, Vit D Def, HLD, Osteoporosis, and prior tracheostomy in '24.    09-06-23 admitted on [facility] ED with SOB and )2   Sats --70s 09-07-23-  Patient was intubated and transferred to [facility]/ICU, Echo 35-45% 09-09-23 successfully extubated 09-12-23 worsening respiratory status requiring BiPap  Palliative medicine has seen patient  multiple times in the past.  PMT consulted  for goals of care conversations.   Family face treatment option decisions, advanced directive decisions and anticipatory care needs.    Subjective  Extensive chart review has been completed prior to meeting with patient/family  including labs, vital signs, imaging, progress/consult notes, orders, medications and available advance directive documents.    This NP assessed patient at the bedside as a follow up for palliative medicine needs and emotional support and to meet with family as scheduled for ongoing conversation regarding current medical situation.  Patient appears weak and frail, she is minimally responsive.   Family at bedside includes her husband, son and grand-daughter.  Introduced concept of Palliative Care as specialized medical care for people and their families living with serious illness.  If focuses on providing relief from the symptoms and stress of a serious illness.  The goal is to improve quality of life for both the patient  and the family.   Values and goals important to patient and family were attempted to be illicited.  We explored EOL wishes  Education offered on the seriousness of her current medical situation.    Education offered on her overall failure to thrive and limitations of medical interventions to prolong quality of life when a body fails to thrive.  Education offered on her acute on chronic respiratory failure. Discussed in detail utilization of BiPap; risks and benefits   Education offered on the diagnosis of dementia and its natural trajectory.  Family report continued cognitive decline since 2018, rapid decline in past few months.  Education offered on the difference between an aggressive medical intervention path attempting  to prolong life and a palliative comfort path allowing for a natural death.    We explored concept of suffering.       Family all understand the overall poor prognosis, they express the  difficulty of making decisions regarding treatment options and de-escalation of care is for them.   They want  to make sure they are doing the right thing   They need more time.  Family make decision today to continue current medical interventions hoping for signs of improvement.  They agree to meet again in one week, next Monday June 30 at 3pm  I shared with them my concern that patient is high risk and may decompensate and possible die before then.   They understand  Questions and concerns addressed   Discussed with primary team and nursing staff  PMT will continue to support holistically   Hard Choices booklet left for review   Time: 65  minutes  Detailed review of medical records ( labs, imaging, vital signs), medically appropriate exam  (MS, skin, cardiac,  resp)   discussed with treatment team, counseling and education to patient, family, staff, documenting clinical information, medication management, coordination of care    Ronal Plants NP  Palliative Medicine Team Team  Phone # [phone] Pager [phone]

## 2023-09-15 NOTE — Progress Notes (Signed)
   09/15/23 2228  Vent Select  Invasive or Noninvasive Noninvasive  Adult Vent Y  Adult Ventilator Settings  Vent Type Servo i  Vent Mode BIPAP;PCV  FiO2 (%) 40 %  IPAP 14 cmH20  EPAP 8 cmH20  Pressure Control 6 cmH20  PEEP 8 cmH20  Adult Ventilator Measurements  Peak Airway Pressure 16 L/min  Mean Airway Pressure 11 cmH20  Resp Rate Spontaneous 17 br/min  Resp Rate Total 32 br/min  Spont TV 346 mL  Measured Ve 10.3 L  I:E Ratio Measured 1:1.2  Auto PEEP 0 cmH20  Total PEEP 8 cmH20  SpO2 96 %  Adult Ventilator Alarms  Alarms On Y  Ve High Alarm 25 L/min  Ve Low Alarm 4 L/min  Resp Rate High Alarm 42 br/min  Resp Rate Low Alarm 8  PEEP Low Alarm 3 cmH2O  Press High Alarm 30 cmH2O  T Apnea 20 sec(s)  Breath Sounds  Bilateral Breath Sounds Diminished  R Upper  Breath Sounds Diminished  L Upper Breath Sounds Diminished  R Lower Breath Sounds Diminished  L Lower Breath Sounds Diminished  Suction Method  Respiratory Interventions Oral suction  Oral Suctioning/Secretions  Suction Type Oral  Suction Device Yankauer  Secretion Amount Moderate  Secretion Color White;Tan  Secretion Consistency Thick  Suction Tolerance Tolerated fairly well  Suctioning Adverse Effects None

## 2023-09-15 NOTE — Progress Notes (Signed)
 TRH ROUNDING NOTE Carla [name] FMW:[id number]  DOB: 1945-07-14  DOA: 09/06/2023  PCP: Patient, [name]  09/15/2023,4:37 PM  LOS: 9 days    Code Status: DNR-contemplating intubation if worsens however--if worsens transfer to ICU   from: Home current Dispo: Guarded likely in the hospital death   78 year old female Ongoing tobacco, COPD stage I HTN HLD primary hyperparathyroidism Community hospitalization 05/2022-08/2022 with hypercarbic respiratory failure metabolic encephalopathy- Tracheostomy placed  subsequent hospitalization 3/7-3 21 2025 again with respiratory failure requiring BiPAP   09/06/2023 presented to [facility] ED short of breath O2 sat 70s placed on nonrebreather 15 L BUN/creatinine 17/1.1 LFTs normal BNP 4500, troponin 308 VBG pH 7.2/73/31--- CXR left basilar infiltrate WBC 13 platelet 412--lactic acid 2.7- RSV/COVID/flu negative UA negative--BCx 2 collected 6/15 ICU transfer to [facility] failed BiPAP intubated hypotensive requiring Levophed  Echo = EF 35-45% with wall motion abnormality---- palliative medicine consulted 6/16 PICC line placed: 77 CVP 6 placed on heparin  gtt. 6/17 extubated off pressors and sent to floor 6/20 significant worsening respiratory status requiring BiPAP-palliative reconsulted   Plan  Acute hypoxic respiratory failure-noncompliance with regimen with severe hypoxia 6/19 Very poor overall prognosis--decompensated 6/19 but stabilized requiring intermittent BiPAP at night  extended azithromycin  500 and ceftriaxone  2g IV  Not safe to eat-n.p.o. for now--- await outcome palliative discussion and then decide further Check a.m. x-ray depending on goals discussion  COPD underlying smoker Continue Solu-Medrol  every 12, as needed inhaler albuterol  2.5Q4, Anoro Ellipta  1 daily- Continue NicoDerm patch 21 mcg  NSTEMI HFrEF Oral meds held rectal aspirin  300 Metoprolol  IV every 6 heart rate >120  Metabolic encephalopathy with acute delirium in the setting of  probable underlying dementia Very poor prognosis overall multiple discussions held with family about end-of-life care and discussion has required BiPAP several times between last year and this year and has been recommended to pursue palliative measures as an outpatient There is a goals of care discussion pending today 6/23--- we await that in person discussion with husband and family Ativan  2 every 4 as needed anxiety agitation, Haldol  be cut back to 1 mg every 8 as needed to allow for awake state Safety sitter to be reordered required safety sitter as agitated and at risk of pulling out lines at risk of harming self   DVT prophylaxis: Heparin   Status is: Inpatient Remains inpatient appropriate because:   Requires goals of care and end-of-life discussions      Subjective:  Somnolent, appeared to be on BiPAP last night On nasal cannula now does not really awaken  Objective + exam Vitals:   09/15/23 1116 09/15/23 1145 09/15/23 1519 09/15/23 1552  BP:  (!) 171/82  (!) 141/64  Pulse: 74 80 66 68  Resp:  (!) 22 (!) 24 (!) 23  Temp:  97.8 F (36.6 C)  97.6 F (36.4 C)  TempSrc:  Oral  Axillary  SpO2:  100% 98% 95%  Weight:      Height:       Filed Weights   09/12/23 0638 09/14/23 0500 09/15/23 0500  Weight: 67.2 kg 45.6 kg 45.2 kg    Examination:  Incoherent arouses  on nasal oxygen  Ant chest clear Abdomen soft no rebound PureWick in place No lower extremity edema Not awake enough to interact with neuroexam  Data Reviewed: reviewed   CBC    Component Value Date/Time   WBC 9.2 09/15/2023 0500   RBC 4.72 09/15/2023 0500   HGB 14.0 09/15/2023 0500   HGB 17.6 (H)  08/10/2020 0928   HCT 46.2 (H) 09/15/2023 0500   HCT 51.4 (H) 08/10/2020 0928   PLT 302 09/15/2023 0500   PLT 289 08/10/2020 0928   MCV 97.9 09/15/2023 0500   MCV 92 08/10/2020 0928   MCH 29.7 09/15/2023 0500   MCHC 30.3 09/15/2023 0500   RDW 15.3 09/15/2023 0500   RDW 12.9 08/10/2020 0928    LYMPHSABS 1.6 09/15/2023 0500   MONOABS 0.6 09/15/2023 0500   EOSABS 0.0 09/15/2023 0500   BASOSABS 0.0 09/15/2023 0500      Latest Ref Rng & Units 09/15/2023    5:00 AM 09/14/2023    6:13 AM 09/13/2023    3:54 AM  CMP  Glucose 70 - 99 mg/dL 835  853  842   BUN 8 - 23 mg/dL 32  28  25   Creatinine 0.44 - 1.00 mg/dL 9.06  9.19  9.18   Sodium 135 - 145 mmol/L 143  140  141   Potassium 3.5 - 5.1 mmol/L 4.1  3.9  4.7   Chloride 98 - 111 mmol/L 105  106  105   CO2 22 - 32 mmol/L 30  31  28    Calcium  8.9 - 10.3 mg/dL 9.6  8.9  9.2     Scheduled Meds:  aspirin   300 mg Rectal Daily   Chlorhexidine  Gluconate Cloth  6 each Topical Daily   heparin  injection (subcutaneous)  5,000 Units Subcutaneous Q8H   methylPREDNISolone  (SOLU-MEDROL ) injection  40 mg Intravenous Q12H   metoprolol  tartrate  5 mg Intravenous Q6H   multivitamin with minerals  1 tablet Oral Daily   nicotine   21 mg Transdermal Daily   mouth rinse  15 mL Mouth Rinse Q2H   sodium chloride  flush  10-40 mL Intracatheter Q12H   umeclidinium-vilanterol  1 puff Inhalation Daily   Continuous Infusions:  azithromycin  250 mL/hr at 09/15/23 0159   cefTRIAXone  (ROCEPHIN )  IV 200 mL/hr at 09/15/23 0148    Time 22  Colen Grimes, MD  [facility]

## 2023-09-15 NOTE — Progress Notes (Signed)
 RT at bedside to for rounds and noticed pt off BiPAP, now on 5L [place]. Pt tolerating well with SVS. Pt in no distress. Pt is currently sleeping soundly. BiPAP on stby at bedside.

## 2023-09-16 DIAGNOSIS — J189 Pneumonia, unspecified organism: Secondary | ICD-10-CM | POA: Diagnosis not present

## 2023-09-16 LAB — CBC WITH DIFFERENTIAL/PLATELET
Abs Immature Granulocytes: 0.04 10*3/uL (ref 0.00–0.07)
Basophils Absolute: 0 10*3/uL (ref 0.0–0.1)
Basophils Relative: 0 %
Eosinophils Absolute: 0 10*3/uL (ref 0.0–0.5)
Eosinophils Relative: 0 %
HCT: 44 % (ref 36.0–46.0)
Hemoglobin: 13.5 g/dL (ref 12.0–15.0)
Immature Granulocytes: 0 %
Lymphocytes Relative: 10 %
Lymphs Abs: 0.9 10*3/uL (ref 0.7–4.0)
MCH: 29.9 pg (ref 26.0–34.0)
MCHC: 30.7 g/dL (ref 30.0–36.0)
MCV: 97.3 fL (ref 80.0–100.0)
Monocytes Absolute: 0.3 10*3/uL (ref 0.1–1.0)
Monocytes Relative: 4 %
Neutro Abs: 8.1 10*3/uL — ABNORMAL HIGH (ref 1.7–7.7)
Neutrophils Relative %: 86 %
Platelets: 260 10*3/uL (ref 150–400)
RBC: 4.52 MIL/uL (ref 3.87–5.11)
RDW: 15.3 % (ref 11.5–15.5)
WBC: 9.4 10*3/uL (ref 4.0–10.5)
nRBC: 0 % (ref 0.0–0.2)

## 2023-09-16 LAB — BASIC METABOLIC PANEL WITH GFR
Anion gap: 11 (ref 5–15)
BUN: 35 mg/dL — ABNORMAL HIGH (ref 8–23)
CO2: 30 mmol/L (ref 22–32)
Calcium: 9.7 mg/dL (ref 8.9–10.3)
Chloride: 105 mmol/L (ref 98–111)
Creatinine, Ser: 0.92 mg/dL (ref 0.44–1.00)
GFR, Estimated: >60 mL/min (ref >60)
Glucose, Bld: 149 mg/dL — ABNORMAL HIGH (ref 70–99)
Potassium: 4.5 mmol/L (ref 3.5–5.1)
Sodium: 146 mmol/L — ABNORMAL HIGH (ref 135–145)

## 2023-09-16 LAB — GLUCOSE, CAPILLARY: Glucose-Capillary: 141 mg/dL — ABNORMAL HIGH (ref 70–99)

## 2023-09-16 MED ORDER — SODIUM CHLORIDE 0.9 % IV SOLN: INTRAVENOUS | Status: DC

## 2023-09-16 MED ORDER — METOPROLOL TARTRATE 5 MG/5ML IV SOLN: 5.0000 mg | Freq: Four times a day (QID) | INTRAVENOUS | Status: DC | PRN

## 2023-09-16 NOTE — TOC Progression Note (Signed)
 Transition of Care [facility]) - Progression Note    Patient Details  Name: Carla [name] MRN: [id number] Date of Birth: 18-Apr-1945  Transition of Care [facility]) CM/SW Contact  Montie [name] Louder, [place] Phone Number: 09/16/2023, 12:56 PM  Clinical Narrative:     Per chart review -Safety sitter to be reordered required safety sitter as agitated and at risk of pulling out lines at risk of harming self   TOC continues to follow and assist with needs.   Montie Louder, MSW, LCSW Clinical Social Worker    Expected Discharge Plan: Home/Self Care Barriers to Discharge: Continued Medical Work up  Expected Discharge Plan and Services In-house Referral: Clinical Social Work     Living arrangements for the past 2 months: Single Family Home                                       Social Determinants of Health (SDOH) Interventions SDOH Screenings   Food Insecurity: Patient Unable To Answer (09/08/2023)  Housing: Patient Unable To Answer (09/08/2023)  Transportation Needs: Patient Unable To Answer (09/09/2023)  Utilities: Patient Unable To Answer (09/09/2023)  Alcohol Screen: Low Risk  (02/24/2020)  Depression (PHQ2-9): Low Risk  (04/05/2020)  Financial Resource Strain: Medium Risk (02/24/2020)  Physical Activity: Insufficiently Active (02/24/2020)  Social Connections: Patient Unable To Answer (09/09/2023)  Stress: No Stress Concern Present (02/24/2020)  Tobacco Use: High Risk (06/14/2023)    Readmission Risk Interventions    06/05/2023   11:03 AM 07/22/2022    1:43 PM  Readmission Risk Prevention Plan  Transportation Screening Complete Complete  HRI or Home Care Consult Complete   Social Work Consult for Recovery Care Planning/Counseling Complete   Palliative Care Screening Complete Complete  Medication Review Oceanographer) Complete

## 2023-09-16 NOTE — Progress Notes (Signed)
 TRH ROUNDING NOTE TOMEKO SCOVILLE FMW:[id number]  DOB: Apr 22, 1945  DOA: 09/06/2023  PCP: Patient, [name]  09/16/2023,4:21 PM  LOS: 10 days    Code Status: DNR-contemplating intubation if worsens however--if worsens transfer to ICU   from: Home current Dispo: Guarded likely in the hospital death   78 year old female Ongoing tobacco, COPD stage I HTN HLD primary hyperparathyroidism Community hospitalization 05/2022-08/2022 with hypercarbic respiratory failure metabolic encephalopathy- Tracheostomy placed  subsequent hospitalization 3/7-3 21 2025 again with respiratory failure requiring BiPAP   09/06/2023 presented to [facility] ED short of breath O2 sat 70s placed on nonrebreather 15 L BUN/creatinine 17/1.1 LFTs normal BNP 4500, troponin 308 VBG pH 7.2/73/31--- CXR left basilar infiltrate WBC 13 platelet 412--lactic acid 2.7- RSV/COVID/flu negative UA negative--BCx 2 collected 6/15 ICU transfer to [facility] failed BiPAP intubated hypotensive requiring Levophed  Echo = EF 35-45% with wall motion abnormality---- palliative medicine consulted 6/16 PICC line placed: 77 CVP 6 placed on heparin  gtt. 6/17 extubated off pressors and sent to floor 6/20 significant worsening respiratory status requiring BiPAP-palliative reconsulted   Plan  Acute hypoxic respiratory failure-noncompliance with regimen with severe hypoxia 6/19 Very poor overall prognosis--continues to require BiPAP and now needs it continuously extensive conversation as below extended azithromycin  500 and ceftriaxone  2g IV  Not safe to eat-n.p.o. Saline 40 cc/H time-limited  COPD underlying smoker Continue Solu-Medrol  every 12, as needed inhaler albuterol  2.5 Q4, Anoro Ellipta  1 daily- Continue NicoDerm patch 21 mcg  NSTEMI HFrEF Oral meds held rectal aspirin  300 Metoprolol  IV every 6 heart rate >120  Metabolic encephalopathy with acute delirium in the setting of probable underlying dementia Very poor prognosis overall multiple  discussions held with family about end-of-life care and discussion has required BiPAP several times between last year and this year and has been recommended to pursue palliative measures as an outpatient As of care discussion 6/23-husband and family decided to wait until Monday 6/30 to make a decision about goals I explained to the patient husband quite clearly that we cannot use BiPAP if patients are in restraints and that it is not safe to use BiPAP with chemical restraints either and have lessened the sedation and will only use Ativan  2 mg every 4 as needed and discontinue the Haldol  When I took her off the BiPAP, she decompensated and sats dropped and respiratory rate rose to the 40s She has a very poor prognosis and I have asked that patient husband be aware that it is not safe  use BiPAP once again in patients that are altered etc.-palliative care will follow-up   DVT prophylaxis: Heparin   Status is: Inpatient Remains inpatient appropriate because:   Requires goals of care and end-of-life discussions      Subjective:  Taken off BiPAP becomes agitated restless pulling at my hands pulling at the tube and pulling at the BiPAP She has not been able to eat or take anything p.o. I am unable to get a review of system once again on her  Objective + exam Vitals:   09/16/23 0732 09/16/23 0835 09/16/23 1100 09/16/23 1601  BP: (!) 182/86  (!) 177/66 (!) 174/70  Pulse: 81 70 60 70  Resp: (!) 32  20 (!) 26  Temp: 97.8 F (36.6 C)  97.8 F (36.6 C) 97.7 F (36.5 C)  TempSrc: Axillary  Axillary Axillary  SpO2: 97%  98% 100%  Weight:      Height:       Filed Weights   09/14/23 0500 09/15/23 0500  09/16/23 0543  Weight: 45.6 kg 45.2 kg 45.1 kg    Examination:  Incoherent arouses  on nasal oxygen  Ant chest clear Abdomen soft no rebound PureWick in place No lower extremity edema Not awake enough to interact with neuroexam Very cachectic  Data Reviewed: reviewed   CBC     Component Value Date/Time   WBC 9.4 09/16/2023 0325   RBC 4.52 09/16/2023 0325   HGB 13.5 09/16/2023 0325   HGB 17.6 (H) 08/10/2020 0928   HCT 44.0 09/16/2023 0325   HCT 51.4 (H) 08/10/2020 0928   PLT 260 09/16/2023 0325   PLT 289 08/10/2020 0928   MCV 97.3 09/16/2023 0325   MCV 92 08/10/2020 0928   MCH 29.9 09/16/2023 0325   MCHC 30.7 09/16/2023 0325   RDW 15.3 09/16/2023 0325   RDW 12.9 08/10/2020 0928   LYMPHSABS 0.9 09/16/2023 0325   MONOABS 0.3 09/16/2023 0325   EOSABS 0.0 09/16/2023 0325   BASOSABS 0.0 09/16/2023 0325      Latest Ref Rng & Units 09/16/2023    3:25 AM 09/15/2023    5:00 AM 09/14/2023    6:13 AM  CMP  Glucose 70 - 99 mg/dL 850  835  853   BUN 8 - 23 mg/dL 35  32  28   Creatinine 0.44 - 1.00 mg/dL 9.07  9.06  9.19   Sodium 135 - 145 mmol/L 146  143  140   Potassium 3.5 - 5.1 mmol/L 4.5  4.1  3.9   Chloride 98 - 111 mmol/L 105  105  106   CO2 22 - 32 mmol/L 30  30  31    Calcium  8.9 - 10.3 mg/dL 9.7  9.6  8.9     Scheduled Meds:  aspirin   300 mg Rectal Daily   Chlorhexidine  Gluconate Cloth  6 each Topical Daily   heparin  injection (subcutaneous)  5,000 Units Subcutaneous Q8H   methylPREDNISolone  (SOLU-MEDROL ) injection  40 mg Intravenous Q12H   multivitamin with minerals  1 tablet Oral Daily   nicotine   21 mg Transdermal Daily   mouth rinse  15 mL Mouth Rinse Q2H   sodium chloride  flush  10-40 mL Intracatheter Q12H   umeclidinium-vilanterol  1 puff Inhalation Daily   Continuous Infusions:  sodium chloride      azithromycin  500 mg (09/15/23 1640)   cefTRIAXone  (ROCEPHIN )  IV 2 g (09/15/23 1639)    Time 22  Colen Grimes, MD  [facility]

## 2023-09-17 ENCOUNTER — Inpatient Hospital Stay (HOSPITAL_COMMUNITY)

## 2023-09-17 DIAGNOSIS — J441 Chronic obstructive pulmonary disease with (acute) exacerbation: Secondary | ICD-10-CM | POA: Diagnosis not present

## 2023-09-17 DIAGNOSIS — Z7189 Other specified counseling: Secondary | ICD-10-CM | POA: Diagnosis not present

## 2023-09-17 DIAGNOSIS — Z711 Person with feared health complaint in whom no diagnosis is made: Secondary | ICD-10-CM

## 2023-09-17 DIAGNOSIS — Z515 Encounter for palliative care: Secondary | ICD-10-CM | POA: Diagnosis not present

## 2023-09-17 LAB — CBC WITH DIFFERENTIAL/PLATELET
Abs Immature Granulocytes: 0.03 10*3/uL (ref 0.00–0.07)
Basophils Absolute: 0 10*3/uL (ref 0.0–0.1)
Basophils Relative: 0 %
Eosinophils Absolute: 0 10*3/uL (ref 0.0–0.5)
Eosinophils Relative: 0 %
HCT: 41.6 % (ref 36.0–46.0)
Hemoglobin: 12.8 g/dL (ref 12.0–15.0)
Immature Granulocytes: 0 %
Lymphocytes Relative: 10 %
Lymphs Abs: 0.7 10*3/uL (ref 0.7–4.0)
MCH: 29.6 pg (ref 26.0–34.0)
MCHC: 30.8 g/dL (ref 30.0–36.0)
MCV: 96.3 fL (ref 80.0–100.0)
Monocytes Absolute: 0.3 10*3/uL (ref 0.1–1.0)
Monocytes Relative: 4 %
Neutro Abs: 5.9 10*3/uL (ref 1.7–7.7)
Neutrophils Relative %: 86 %
Platelets: 179 10*3/uL (ref 150–400)
RBC: 4.32 MIL/uL (ref 3.87–5.11)
RDW: 15.1 % (ref 11.5–15.5)
WBC: 7 10*3/uL (ref 4.0–10.5)
nRBC: 0 % (ref 0.0–0.2)

## 2023-09-17 LAB — BASIC METABOLIC PANEL WITH GFR
Anion gap: 8 (ref 5–15)
BUN: 33 mg/dL — ABNORMAL HIGH (ref 8–23)
CO2: 30 mmol/L (ref 22–32)
Calcium: 9.4 mg/dL (ref 8.9–10.3)
Chloride: 108 mmol/L (ref 98–111)
Creatinine, Ser: 0.86 mg/dL (ref 0.44–1.00)
GFR, Estimated: >60 mL/min (ref >60)
Glucose, Bld: 146 mg/dL — ABNORMAL HIGH (ref 70–99)
Potassium: 4.2 mmol/L (ref 3.5–5.1)
Sodium: 146 mmol/L — ABNORMAL HIGH (ref 135–145)

## 2023-09-17 LAB — BLOOD GAS, ARTERIAL
Acid-Base Excess: 5.6 mmol/L — ABNORMAL HIGH (ref 0.0–2.0)
Bicarbonate: 30.6 mmol/L — ABNORMAL HIGH (ref 20.0–28.0)
O2 Saturation: 98.5 %
Patient temperature: 36.5
pCO2 arterial: 44 mmHg (ref 32–48)
pH, Arterial: 7.45 (ref 7.35–7.45)
pO2, Arterial: 88 mmHg (ref 83–108)

## 2023-09-17 LAB — GLUCOSE, CAPILLARY: Glucose-Capillary: 112 mg/dL — ABNORMAL HIGH (ref 70–99)

## 2023-09-17 MED ORDER — BUDESONIDE 0.25 MG/2ML IN SUSP
0.2500 mg | Freq: Two times a day (BID) | RESPIRATORY_TRACT | Status: DC
  Administered 2023-09-17: 0.25 mg via RESPIRATORY_TRACT
  Filled 2023-09-17: qty 2

## 2023-09-17 MED ORDER — ONDANSETRON HCL 4 MG/2ML IJ SOLN: 4.0000 mg | Freq: Four times a day (QID) | INTRAMUSCULAR | Status: DC | PRN

## 2023-09-17 MED ORDER — ACETAMINOPHEN 650 MG RE SUPP: 650.0000 mg | Freq: Four times a day (QID) | RECTAL | Status: DC | PRN

## 2023-09-17 MED ORDER — LORAZEPAM 2 MG/ML IJ SOLN: 0.5000 mg | INTRAMUSCULAR | Status: DC | PRN

## 2023-09-17 MED ORDER — LORAZEPAM 2 MG/ML IJ SOLN
0.5000 mg | INTRAMUSCULAR | Status: DC | PRN
  Administered 2023-09-17: 0.5 mg via INTRAVENOUS
  Filled 2023-09-17: qty 1

## 2023-09-17 MED ORDER — LORAZEPAM 2 MG/ML IJ SOLN
1.5000 mg | Freq: Once | INTRAMUSCULAR | Status: AC
  Administered 2023-09-17: 1.5 mg via INTRAVENOUS

## 2023-09-17 MED ORDER — MORPHINE SULFATE (PF) 2 MG/ML IV SOLN
2.0000 mg | Freq: Once | INTRAVENOUS | Status: AC
  Administered 2023-09-17: 2 mg via INTRAVENOUS
  Filled 2023-09-17: qty 1

## 2023-09-17 MED ORDER — ARFORMOTEROL TARTRATE 15 MCG/2ML IN NEBU
15.0000 ug | INHALATION_SOLUTION | Freq: Two times a day (BID) | RESPIRATORY_TRACT | Status: DC
  Administered 2023-09-17: 15 ug via RESPIRATORY_TRACT
  Filled 2023-09-17: qty 2

## 2023-09-17 MED ORDER — ONDANSETRON 4 MG PO TBDP: 4.0000 mg | ORAL_TABLET | Freq: Four times a day (QID) | ORAL | Status: DC | PRN

## 2023-09-17 MED ORDER — LORAZEPAM 2 MG/ML IJ SOLN
2.0000 mg | INTRAMUSCULAR | Status: DC
  Administered 2023-09-17: 2 mg via INTRAVENOUS
  Filled 2023-09-17 (×2): qty 1

## 2023-09-17 MED ORDER — MORPHINE BOLUS VIA INFUSION: 2.0000 mg | INTRAVENOUS | Status: DC | PRN

## 2023-09-17 MED ORDER — LORAZEPAM 2 MG/ML IJ SOLN
2.0000 mg | INTRAMUSCULAR | Status: DC | PRN
  Filled 2023-09-17: qty 1

## 2023-09-17 MED ORDER — MORPHINE 100MG IN NS 100ML (1MG/ML) PREMIX INFUSION
2.0000 mg/h | INTRAVENOUS | Status: DC
  Administered 2023-09-17: 2 mg/h via INTRAVENOUS
  Filled 2023-09-17: qty 100

## 2023-09-17 MED ORDER — BIOTENE DRY MOUTH MT LIQD: 15.0000 mL | OROMUCOSAL | Status: DC | PRN

## 2023-09-17 MED ORDER — GLYCOPYRROLATE 0.2 MG/ML IJ SOLN: 0.2000 mg | INTRAMUSCULAR | Status: DC | PRN

## 2023-09-17 MED ORDER — ACETAMINOPHEN 325 MG PO TABS: 650.0000 mg | ORAL_TABLET | Freq: Four times a day (QID) | ORAL | Status: DC | PRN

## 2023-09-17 MED ORDER — GLYCOPYRROLATE 1 MG PO TABS: 1.0000 mg | ORAL_TABLET | ORAL | Status: DC | PRN

## 2023-09-17 MED ORDER — GLYCOPYRROLATE 0.2 MG/ML IJ SOLN
0.2000 mg | INTRAMUSCULAR | Status: DC | PRN
  Filled 2023-09-17: qty 1

## 2023-09-17 MED ORDER — POLYVINYL ALCOHOL 1.4 % OP SOLN
1.0000 [drp] | Freq: Four times a day (QID) | OPHTHALMIC | Status: DC | PRN
  Filled 2023-09-17: qty 15

## 2023-09-17 MED ORDER — REVEFENACIN 175 MCG/3ML IN SOLN
175.0000 ug | Freq: Every day | RESPIRATORY_TRACT | Status: DC
  Administered 2023-09-17: 175 ug via RESPIRATORY_TRACT
  Filled 2023-09-17: qty 3

## 2023-09-17 MED ORDER — DEXTROSE-SODIUM CHLORIDE 5-0.45 % IV SOLN: INTRAVENOUS | Status: DC

## 2023-09-17 NOTE — Progress Notes (Signed)
 Nutrition Brief Note  Chart reviewed. Pt now transitioning to comfort care.  No further nutrition interventions planned at this time.  Please re-consult as needed.   Drusilla Kanner, RDN, LDN Clinical Nutrition See AMiON for contact information.

## 2023-09-17 NOTE — Progress Notes (Addendum)
 Palliative Medicine Inpatient Follow Up Note HPI: 78 y/o female with PMH for COPD/Emphysema, HTN, Dementia (AAOx1 at baseline), Anxiety, Depression, GERD, Primary hyperparathyroidism, Vit D Def, HLD, Osteoporosis, and prior tracheostomy in '24. She was BIB EMS to [facility] for difficulty breathing, has subsequently been intubated.    Palliative care has seen Dagoberto in the past in April of '24 and March of '25. We have been consulted during this hospitalization for goals of care conversations.    Today's Discussion 09/05/2023  *Please note that this is a verbal dictation therefore any spelling or grammatical errors are due to the Dragon [employer] One system interpretation.  Chart reviewed inclusive of vital signs, progress notes, laboratory results, and diagnostic images.   I spoke with Dr. Tobie who notes patient has been on bipap for 48 hours. He plans to get a blood gas this morning. He has consulted CCM. He plans to speak to family thereafter.    I spoke with Judy's RN, Harlene this late morning. She shares concerns associated with Judy's present condition and her dependence on bipap support.   I met with Dagoberto at bedside the remains on bipap and is quite somnolent overall.  _____________________ Addendum:  I called and spoke with patients son, Oneil this afternoon. We discussed Judy's current condition and poor prognosis. We reviewed that it is unlikely she can recover from this and time does seem quite limited.  Patients son notes his father is taking a shower but he states they did speak to Dr. Tobie this morning and understand the severity of the current situation. He shares that they are both having a tough time letting it soak in. He understands though that at anytime the patient could pass.   Confirmed DNAR/DNI code status.   I shared we would continue to follow up and offer ongoing support.  _____________________ Addendum:  I spoke to patients husband, Georgette over the phone. We  discussed transitioning Dagoberto to comfort measures this evening. We reviewed Judy's disease burden and her inability to recover from this. We discussed the significance of her lung disease and dementia.   We talked about transition to comfort measures in house and what that would entail inclusive of medications to control pain, dyspnea, agitation, nausea, itching, and hiccups.   We discussed stopping all uneccessary measures such as cardiac monitoring, blood draws, needle sticks, and frequent vital signs.   I shared given the degree of air hunger Dagoberto is experiencing we will initiate a morphine  gtt.   Utilized reflective listening throughout our time together as Dough shares she has been his best friend for the past 61 years. Dough shares he does not want Dagoberto to suffer.   Patients son will be here this evening after he has time to say his goodbyes bipap will be stopped  Questions and concerns addressed.  Add time: 33  Objective Assessment: Vital Signs Vitals:   09/21/2023 0846 09/22/2023 1046  BP: (!) 178/94 (!) 180/86  Pulse: 75 83  Resp: 20 (!) 30  Temp: 97.7 F (36.5 C)   SpO2: 93%     Intake/Output Summary (Last 24 hours) at 08/27/2023 1102 Last data filed at 09/03/2023 0816 Gross per 24 hour  Intake 375.93 ml  Output 600 ml  Net -224.07 ml   Last Weight  Most recent update: 09/16/2023  5:43 AM    Weight  45.1 kg (99 lb 6.8 oz)            Gen:  Elderly Caucasian F chronically  ill appearing HEENT: dry mucous membranes CV: Regular rate and rhythm  PULM: On bipap ABD: soft/nontender  EXT: No edema Neuro: Somnolent  SUMMARY OF RECOMMENDATIONS   DNAR/DNI  Removed Bipap once son arrives to allow transition to end of life more peacefully  Comfort care  Will start a low dose morphine  gtt with boluses and titration PRN  Ativan  2mg  IVP Q4H ATC and as needed PRN  Additional comfort medications per [facility]  Ongoing PMT support  Patient will likely pass in  house ______________________________________________________________________________________ Rosaline Becton [facility] Palliative Medicine Team Team Cell Phone: [phone] Please utilize secure chat with additional questions, if there is no response within 30 minutes please call the above phone number  Time: 31  Palliative Medicine Team providers are available by phone from 7am to 7pm daily and can be reached through the team cell phone.  Should this patient require assistance outside of these hours, please call the patient's attending physician.

## 2023-09-17 NOTE — Hospital Course (Signed)
 Patient with PMH of severe COPD, HTN, HLD, anxiety, depression, primary hyperparathyroidism, GERD present to the hospital with complaints of shortness of breath. 09-06-23 admitted on [facility] ED with SOB and )2   Sats --70s 09-07-23-  Patient was intubated and transferred to [facility]ICU, Echo 35-45% 09-09-23 successfully extubated 09-12-23 worsening respiratory status requiring BiPap 09/15/2023 remains on BiPAP now continuously. 6/25 transition to comfort care as unable to tolerate BiPAP and per PCCM are recommended for intubation. Assessment and Plan: Acute on chronic hypoxic and hypercarbic respiratory failure 2/2 LLL CAP, small left pleural effusion and AECOPD Tobacco abuse Presented with shortness of breath. Requiring intubation and Ativan  hospital. Extubated on 6/17. MRSA PCR, RVP, SARS/ flu/ RSV, and strep ua neg P:  SLP was consulted and the patient was able to swallow safely although continues to have progressive decline in her respiratory status and requiring BiPAP. Since 6/23 patient has been continuously on the BiPAP.  Unable to tolerate able to come off the BiPAP with severe tachycardia tachypnea and hypoxia with saturations in 70s. On 6/25 discussed with pulmonary.  ABG was reassuring although patient not able to come off the BiPAP again.  Patient if ends up on a ventilator most likely will require trach and PEG and prognosis will be guarded. Discussed this with family in detail.  Family's heart is to focus comfort to the patient. Palliative care care was consulted. Given her inability to come off of the BiPAP, inability to come off of the Ativan  and ongoing respiratory distress despite being on BiPAP recommended that the patient transition to complete comfort.  Family agreed with the recommendation.  Anticipating hospital death.   Multifactorial Shock, resolved-  septic 2/2 PNA, possible cardiogenic and sedation needs could be contributing off pressors after extubation 6/17 Blood cultures  ngtd Completed 5 days of azithro/ ctx   New HFrEF EF 35-40% NSTEMI Elevated BNP Small pericardial effusion Suspect chronic PH CAD 6/15 TTE > LVEF 35-40%, G2DD, +regional wall motion abnormalities (apical lateral segment, mid anterolateral segment, mid inferoseptal segment, apical septal segment, and apex are akinetic, RV mildly reduced, RVSP 37.8, small apex and circumferential pericardial effusion, noted left pleural effusion, mild aortic regurgitation, suggestive RA 15.  Previous EF 55-60% in March 2025 Cardiology was consulted. Was on heparin . Echocardiogram shows pericardial effusion without evidence of tamponade. Conservative therapy was recommended.  Patient has been receiving aspirin  via suppository as unable to tolerate p.o. or secondary to need for BiPAP. Cardiology recommended to focus on comfort.   AKI, resolved Baseline creatinine 0.9. Creatinine peaked at 1.5.   Hyperglycemia, 2/2 reactive/ steroids  A1c 5.9   Dementia with delirium Aox1 at baseline.   Tobacco abuse nicotine  patch,  Now comfort care.   GOC Changed to limited code- no CPR 6/16. Overall prognosis remains poor given multi co-morbidities, recurrent resp failure and now new HFrEF. Multiple conversation with family on 6/25 including discussed with son twice on phone as well as husband twice on the phone as well as met with son at bedside as well as niece. Patient's prognosis is very poor.  Patient is unable to tolerate BiPAP and has been unable to come off of the BiPAP for 48 hours.  No record needed for intubation.  Requiring medications to support her breathing for comfort. Recommended family transition to complete comfort given poor prognosis.  Family agreed.  Anticipating hospital death.

## 2023-09-17 NOTE — Progress Notes (Signed)
 ABG drawn and sent to lab

## 2023-09-17 NOTE — Progress Notes (Addendum)
 [facility] Progress Note Patient: Carla [name] FMW:[id number] DOB: 01-13-1946 DOA: 09/06/2023  DOS: the patient was seen and examined on 09/04/2023  Brief Hospital Course: Patient with PMH of severe COPD, HTN, HLD, anxiety, depression, primary hyperparathyroidism, GERD present to the hospital with complaints of shortness of breath. 09-06-23 admitted on [facility] ED with SOB and )2   Sats --70s 09-07-23-  Patient was intubated and transferred to [facility]/ICU, Echo 35-45% 09-09-23 successfully extubated 09-12-23 worsening respiratory status requiring BiPap 09/15/2023 remains on BiPAP now continuously. 6/25 transition to comfort care as unable to tolerate BiPAP and per PCCM are recommended for intubation. Assessment and Plan: Acute on chronic hypoxic and hypercarbic respiratory failure 2/2 LLL CAP, small left pleural effusion and AECOPD Tobacco abuse Presented with shortness of breath. Requiring intubation and Ativan  hospital. Extubated on 6/17. MRSA PCR, RVP, SARS/ flu/ RSV, and strep ua neg P:  SLP was consulted and the patient was able to swallow safely although continues to have progressive decline in her respiratory status and requiring BiPAP. Since 6/23 patient has been continuously on the BiPAP.  Unable to tolerate able to come off the BiPAP with severe tachycardia tachypnea and hypoxia with saturations in 70s. On 6/25 discussed with pulmonary.  ABG was reassuring although patient not able to come off the BiPAP again.  Patient if ends up on a ventilator most likely will require trach and PEG and prognosis will be guarded. Discussed this with family in detail.  Family's heart is to focus comfort to the patient. Palliative care care was consulted. Given her inability to come off of the BiPAP, inability to come off of the Ativan  and ongoing respiratory distress despite being on BiPAP recommended that the patient transition to complete comfort.  Family agreed with the recommendation.  Anticipating  hospital death.   Multifactorial Shock, resolved-  septic 2/2 PNA, possible cardiogenic and sedation needs could be contributing off pressors after extubation 6/17 Blood cultures ngtd Completed 5 days of azithro/ ctx   New HFrEF EF 35-40% NSTEMI Elevated BNP Small pericardial effusion Suspect chronic PH CAD 6/15 TTE > LVEF 35-40%, G2DD, +regional wall motion abnormalities (apical lateral segment, mid anterolateral segment, mid inferoseptal segment, apical septal segment, and apex are akinetic, RV mildly reduced, RVSP 37.8, small apex and circumferential pericardial effusion, noted left pleural effusion, mild aortic regurgitation, suggestive RA 15.  Previous EF 55-60% in March 2025 Cardiology was consulted. Was on heparin . Echocardiogram shows pericardial effusion without evidence of tamponade. Conservative therapy was recommended.  Patient has been receiving aspirin  via suppository as unable to tolerate p.o. or secondary to need for BiPAP. Cardiology recommended to focus on comfort.   AKI, resolved Baseline creatinine 0.9. Creatinine peaked at 1.5.   Hyperglycemia, 2/2 reactive/ steroids  A1c 5.9   Dementia with delirium Aox1 at baseline.   Tobacco abuse nicotine  patch,  Now comfort care.   GOC Changed to limited code- no CPR 6/16. Overall prognosis remains poor given multi co-morbidities, recurrent resp failure and now new HFrEF. Multiple conversation with family on 6/25 including discussed with son twice on phone as well as husband twice on the phone as well as met with son at bedside as well as niece. Patient's prognosis is very poor.  Patient is unable to tolerate BiPAP and has been unable to come off of the BiPAP for 48 hours.  No record needed for intubation.  Requiring medications to support her breathing for comfort. Recommended family transition to complete comfort given poor prognosis.  Family agreed.  Anticipating hospital death.   Subjective: Severely agitated.   No nausea or vomiting.  Unable to follow any commands.  Unable to verbalize anything.  Physical Exam: General: in severe distress, No Rash Cardiovascular: S1 and S2 Present, No Murmur Respiratory: Increased respiratory effort, Bilateral Air entry present. No Crackles, occasional wheezes Abdomen: Bowel Sound present, difficult to assess tenderness Extremities: No edema Neuro: Lethargic, nonverbal, unable to follow commands, spontaneously moves all extremities though.  Data Reviewed: I have Reviewed nursing notes, Vitals, and Lab results. Since last encounter, pertinent lab results CBC and BMP   . I have ordered test including none  . I have discussed pt's care plan and test results with palliative care and pulmonary  . I have ordered imaging chest x-ray  .   Disposition: Status is: Inpatient Remains inpatient appropriate because: Now comfort care.  Family Communication: Multiple discussion with the family as above. Level of care: Telemetry Cardiac   Vitals:   09/12/2023 1046 09/12/2023 1100 09/16/2023 1200 08/26/2023 1436  BP: (!) 180/86 (!) 181/75 (!) 176/73 (!) 141/77  Pulse: 83 72 65 (!) 108  Resp: (!) 30 (!) 21 (!) 23 (!) 39  Temp:  97.7 F (36.5 C) 98 F (36.7 C) 97.6 F (36.4 C)  TempSrc:  Axillary Axillary Axillary  SpO2: 93% 93% 100% 97%  Weight:      Height:       The patient is critically ill with multiple organ systems failure and requires high complexity decision making for assessment and support, frequent evaluation and titration of therapies. Critical Care Time devoted to patient care services described in this note is 35 minutes   Author: Yetta Blanch, MD 09/11/2023 7:42 PM  Please look on [email or web address] to find out who is on call.

## 2023-09-23 NOTE — Death Summary Note (Signed)
 DEATH SUMMARY   Patient Details  Name: Carla [name] MRN: [id number] DOB: Nov 22, 1945 ERE:[facility], [name] Admission/Discharge Information   Admit Date:  09-25-2023  Date of Death: Date of Death: 10-07-23  Time of Death: Time of Death: 0515  Length of Stay: Sep 23, 2023   Principle Cause of death: acute on chronic hypoxic and hypercarbic respiratory failure secondary to pneumonia and acute exacerbation of COPD  Hospital Diagnoses: Principal Problem:   CAP (community acquired pneumonia) Active Problems:   Dementia (HCC)   COPD with acute exacerbation (HCC)   Acute on chronic respiratory failure with hypoxia and hypercapnia (HCC)   Elevated brain natriuretic peptide (BNP) level   Elevated troponin   Hyperglycemia   Septic shock (HCC)   On mechanically assisted ventilation (HCC)   NSTEMI (non-ST elevated myocardial infarction) [facility])   Hospital Course: Patient with PMH of severe COPD, HTN, HLD, anxiety, depression, primary hyperparathyroidism, GERD present to the hospital with complaints of shortness of breath. 25-Sep-2023 admitted on [facility] ED with SOB and )2   Sats --70s 6/15  Patient was intubated and transferred to [facility]ICU, Echo 35-45% 6/17 successfully extubated 6/20 worsening respiratory status requiring BiPap 6/23 remains on BiPAP now continuously. 6/25 transition to comfort care as unable to tolerate BiPAP.   Assessment and Plan: Acute on chronic hypoxic and hypercarbic respiratory failure 2/2 LLL CAP, small left pleural effusion and AECOPD Tobacco abuse Presented with shortness of breath. Requiring intubation and Ativan  hospital. Extubated on 6/17. MRSA PCR, RVP, SARS/ flu/ RSV, and strep ua neg SLP was consulted and the patient was able to swallow safely Since 6/23 patient has been continuously on the BiPAP.  Unable to tolerate  coming off of the BiPAP with severe tachycardia, heart rate trending all the way up to 130s, tachypnea respiratory rate in 40s and hypoxia with  saturations in 70s. Even on BiPAP patient was breathing at a rate of 30s. On 6/25 discussed with pulmonary.  ABG was reassuring although patient not able to come off the BiPAP again.  Patient if ends up on a ventilator most likely will require trach and PEG and prognosis will be guarded. Discussed this with family in detail. Palliative care care was consulted. Given her inability to come off of the BiPAP, inability to come off of the Ativan  and ongoing respiratory distress despite being on BiPAP recommended that the patient transition to complete comfort.  Family agreed with the recommendation.   Multifactorial Shock, resolved septic 2/2 PNA, possible cardiogenic and sedation medications were off pressors after extubation 6/17 Blood cultures ngtd Completed 5 days of azithro/ ctx   Acute HFrEF EF 35-40% NSTEMI Elevated BNP Small pericardial effusion Suspect chronic PH CAD 6/15 TTE > LVEF 35-40%, G2DD, +regional wall motion abnormalities (apical lateral segment, mid anterolateral segment, mid inferoseptal segment, apical septal segment, and apex are akinetic, RV mildly reduced, RVSP 37.8, small apex and circumferential pericardial effusion, noted left pleural effusion, mild aortic regurgitation, suggestive RA 15.  Previous EF 55-60% in March 2025 Cardiology was consulted. Was on heparin . Echocardiogram shows small pericardial effusion without evidence of tamponade. Conservative therapy was recommended.  Patient has been receiving aspirin  via suppository as unable to tolerate p.o. or secondary to need for BiPAP. Cardiology recommended to focus on comfort.   AKI, resolved Baseline creatinine 0.9. Creatinine peaked at 1.5.   Hyperglycemia, 2/2 reactive/ steroids  A1c 5.9   Dementia with delirium Aox1 at baseline.   Tobacco abuse nicotine  patch,  Now comfort care.  GOC Changed to limited code- no CPR 6/16. Overall prognosis remains poor given multi co-morbidities, recurrent resp  failure and now new HFrEF. Multiple conversation with family on 6/25 including discussed with son twice on phone as well as husband twice on the phone as well as met with son at bedside as well as niece. Patient's prognosis is very poor.  Patient is unable to tolerate BiPAP and has been unable to come off of the BiPAP for 48 hours.  No record needed for intubation.  Requiring medications to support her breathing for comfort. Recommended family transition to complete comfort given poor prognosis.  Family agreed.  Anticipating hospital death.  Severe protein calorie malnutrition. Secondary to COPD. Body mass index is 19.42 kg/m.  Placing the patient at a high risk for outcome. Was requiring tube feeding.  After SLP evaluation was allowed for diet although unable to eat much.  Procedures: Echocardiogram  Consultations: Primary admission with PCCM Perative care  The results of significant diagnostics from this hospitalization (including imaging, microbiology, ancillary and laboratory) are listed below for reference.   Significant Diagnostic Studies: DG CHEST PORT 1 VIEW Result Date: 09/21/2023 CLINICAL DATA:  Shortness of breath EXAM: PORTABLE CHEST 1 VIEW COMPARISON:  CT chest 06/12/2022, chest radiograph 09/12/2023. FINDINGS: Lungs are hyperinflated. Prominence of the central pulmonary vasculature. Similar appearance of small to moderate left pleural effusion. There is an additional small right pleural effusion which has slowly increased since 09/07/2023. Increased opacities in the right lung base concerning for atelectasis. Similar opacities in the mid and lower left lung. Cardiomediastinal silhouette is similar to prior. Aortic arch calcifications. Visualized osseous structures without acute abnormality. Right PICC with tip over the lower SVC. IMPRESSION: Similar small to moderate left pleural effusion. Increasing small right pleural effusion with increased right basilar opacities concerning for  atelectasis versus infiltrate. Similar left basilar consolidation. Prominence of the central pulmonary vasculature and interstitial prominence suggestive of mild pulmonary edema. Electronically Signed   By: Donnice Mania M.D.   On: 08/25/2023 15:03   DG Chest Port 1 View Result Date: 09/12/2023 CLINICAL DATA:  33497 Acute respiratory distress 33497 EXAM: PORTABLE CHEST - 1 VIEW COMPARISON:  September 07, 2023, April 30, 2019 FINDINGS: Hyperexpanded lungs. Architectural distortion in the upper lungs. Hazy nodular opacities within the right mid and lower lung zones. Redemonstrated left pleural effusion with left basilar airspace opacities. No pneumothorax. Unchanged mild cardiomegaly. Tortuous aorta with aortic atherosclerosis. No acute fracture or destructive lesions. Multilevel thoracic osteophytosis. Osteopenia. Right PICC terminates at the cavoatrial junction. IMPRESSION: 1. Emphysema. Worsening hazy airspace disease in the mid and right lung base, possibly reflecting a developing bronchopneumonia or asymmetric pulmonary edema. 2. Unchanged small left pleural effusion with left basilar consolidation. 3. Right PICC terminates at the cavoatrial junction. Electronically Signed   By: Rogelia Myers M.D.   On: 09/12/2023 08:52   US  EKG SITE RITE Result Date: 09/08/2023 If Site Rite image not attached, placement could not be confirmed due to current cardiac rhythm.  ECHOCARDIOGRAM COMPLETE Result Date: 09/07/2023    ECHOCARDIOGRAM REPORT   Patient Name:   SHAUNTIA LEVENGOOD Date of Exam: 09/07/2023 Medical Rec #:  [id number]        Height:       60.0 in Accession #:    [id number]       Weight:       91.3 lb Date of Birth:  08/09/1945        BSA:  1.337 m Patient Age:    78 years         BP:           111/56 mmHg Patient Gender: F                HR:           60 bpm. Exam Location:  Inpatient Procedure: 2D Echo (Both Spectral and Color Flow Doppler were utilized during            procedure). Indications:     acute respiratory distress  History:        Patient has prior history of Echocardiogram examinations, most                 recent 06/02/2023. COPD, Signs/Symptoms:elevated troponin; Risk                 Factors:Hypertension.  Sonographer:    Tinnie Barefoot RDCS Referring Phys: [id number] FONDA JAYSON SHARPS  Sonographer Comments: Echo performed with patient supine and on artificial respirator. IMPRESSIONS  1. Left ventricular ejection fraction, by estimation, is 35 to 40%. The left ventricle has moderately decreased function. The left ventricle demonstrates regional wall motion abnormalities (see scoring diagram/findings for description). There is mild concentric left ventricular hypertrophy. Left ventricular diastolic parameters are consistent with Grade II diastolic dysfunction (pseudonormalization).  2. Right ventricular systolic function is mildly reduced. The right ventricular size is normal. There is mildly elevated pulmonary artery systolic pressure. The estimated right ventricular systolic pressure is 37.8 mmHg.  3. A small pericardial effusion is present. The pericardial effusion is surrounding the apex and circumferential. Large pleural effusion in the left lateral region.  4. The mitral valve is normal in structure. No evidence of mitral valve regurgitation. No evidence of mitral stenosis.  5. The aortic valve is tricuspid. There is mild calcification of the aortic valve. Aortic valve regurgitation is mild. Aortic valve sclerosis is present, with no evidence of aortic valve stenosis.  6. The inferior vena cava is dilated in size with <50% respiratory variability, suggesting right atrial pressure of 15 mmHg. Comparison(s): Prior images reviewed side by side. Significant changes have occured. FINDINGS  Left Ventricle: Left ventricular ejection fraction, by estimation, is 35 to 40%. The left ventricle has moderately decreased function. The left ventricle demonstrates regional wall motion abnormalities. The left  ventricular internal cavity size was normal in size. There is mild concentric left ventricular hypertrophy. Left ventricular diastolic parameters are consistent with Grade II diastolic dysfunction (pseudonormalization).  LV Wall Scoring: The apical lateral segment, mid anterolateral segment, mid inferoseptal segment, apical septal segment, and apex are akinetic. Right Ventricle: The right ventricular size is normal. No increase in right ventricular wall thickness. Right ventricular systolic function is mildly reduced. There is mildly elevated pulmonary artery systolic pressure. The tricuspid regurgitant velocity  is 2.39 m/s, and with an assumed right atrial pressure of 15 mmHg, the estimated right ventricular systolic pressure is 37.8 mmHg. Left Atrium: Left atrial size was normal in size. Right Atrium: Right atrial size was normal in size. Pericardium: A small pericardial effusion is present. The pericardial effusion is surrounding the apex and circumferential. Mitral Valve: The mitral valve is normal in structure. No evidence of mitral valve regurgitation. No evidence of mitral valve stenosis. Tricuspid Valve: The tricuspid valve is normal in structure. Tricuspid valve regurgitation is trivial. No evidence of tricuspid stenosis. Aortic Valve: The aortic valve is tricuspid. There is mild calcification of the aortic valve. Aortic valve  regurgitation is mild. Aortic regurgitation PHT measures 667 msec. Aortic valve sclerosis is present, with no evidence of aortic valve stenosis. Pulmonic Valve: The pulmonic valve was normal in structure. Pulmonic valve regurgitation is not visualized. No evidence of pulmonic stenosis. Aorta: The aortic root and ascending aorta are structurally normal, with no evidence of dilitation. Venous: The inferior vena cava is dilated in size with less than 50% respiratory variability, suggesting right atrial pressure of 15 mmHg. IAS/Shunts: The atrial septum is grossly normal. Additional  Comments: There is a large pleural effusion in the left lateral region.  LEFT VENTRICLE PLAX 2D LVIDd:         4.10 cm     Diastology LVIDs:         3.20 cm     LV e' medial:    2.94 cm/s LV PW:         1.20 cm     LV E/e' medial:  26.0 LV IVS:        1.20 cm     LV e' lateral:   3.59 cm/s LVOT diam:     1.90 cm     LV E/e' lateral: 21.3 LV SV:         59 LV SV Index:   44 LVOT Area:     2.84 cm  LV Volumes (MOD) LV vol d, MOD A4C: 92.1 ml LV vol s, MOD A4C: 58.8 ml LV SV MOD A4C:     92.1 ml RIGHT VENTRICLE            IVC RV Basal diam:  2.70 cm    IVC diam: 2.50 cm RV S prime:     7.29 cm/s TAPSE (M-mode): 0.9 cm LEFT ATRIUM             Index        RIGHT ATRIUM           Index LA diam:        3.50 cm 2.62 cm/m   RA Area:     11.20 cm LA Vol (A2C):   56.6 ml 42.32 ml/m  RA Volume:   25.20 ml  18.84 ml/m LA Vol (A4C):   24.1 ml 18.02 ml/m LA Biplane Vol: 39.8 ml 29.76 ml/m  AORTIC VALVE LVOT Vmax:   106.00 cm/s LVOT Vmean:  65.600 cm/s LVOT VTI:    0.209 m AI PHT:      667 msec  AORTA Ao Root diam: 3.20 cm Ao Asc diam:  3.20 cm MITRAL VALVE                TRICUSPID VALVE MV Area (PHT): 3.89 cm     TR Peak grad:   22.8 mmHg MV Decel Time: 195 msec     TR Vmax:        239.00 cm/s MV E velocity: 76.40 cm/s MV A velocity: 110.00 cm/s  SHUNTS MV E/A ratio:  0.69         Systemic VTI:  0.21 m                             Systemic Diam: 1.90 cm Stanly Leavens MD Electronically signed by Stanly Leavens MD Signature Date/Time: 09/07/2023/12:56:13 PM    Final    DG Chest Port 1 View Result Date: 09/07/2023 CLINICAL DATA:  Acute respiratory failurer. EXAM: PORTABLE CHEST 1 VIEW COMPARISON:  09/07/2023 FINDINGS: Endotracheal tube tip is approximately 2.8 cm above the  base of the carina. The NG tube passes into the stomach although the distal tip position is not included on the film. Lungs are hyperexpanded. Cardiopericardial silhouette is at upper limits of normal for size. Left base collapse/consolidation  again noted with small left pleural effusion. Interstitial markings are diffusely coarsened with chronic features. Bones are diffusely demineralized. Telemetry leads overlie the chest. IMPRESSION: 1. Endotracheal tube tip is approximately 2.8 cm above the base of the carina. 2. Left base collapse/consolidation with small left pleural effusion. Electronically Signed   By: Camellia Candle M.D.   On: 09/07/2023 05:05   DG Chest Portable 1 View Result Date: 09/07/2023 CLINICAL DATA:  Status post intubation. EXAM: PORTABLE CHEST 1 VIEW COMPARISON:  September 06, 2023 FINDINGS: Since the prior study there is been interval placement of an endotracheal tube. Its distal tip is approximately 4.6 cm from the carina. Interval enteric tube position is also seen with its distal portion extending below the level of the diaphragm. The heart size and mediastinal contours are within normal limits. There is marked severity calcification of the aortic arch. The lungs are hyperinflated with stable diffuse chronic appearing increased interstitial lung markings. Moderate severity left basilar atelectasis and/or infiltrate is seen with a small left pleural effusion. No pneumothorax is identified. Multilevel degenerative changes seen throughout the thoracic spine. IMPRESSION: 1. Interval endotracheal tube and enteric tube placement, as described above. 2. Moderate severity left basilar atelectasis and/or infiltrate with a small left pleural effusion. Electronically Signed   By: Suzen Dials M.D.   On: 09/07/2023 01:48   DG Chest Port 1 View Result Date: 09/06/2023 CLINICAL DATA:  Questionable sepsis-evaluate for abnormality. Difficulty breathing. Dementia. Decreased oral intake. EXAM: PORTABLE CHEST 1 VIEW COMPARISON:  06/14/2023 FINDINGS: Stable cardiomediastinal silhouette. Aortic atherosclerotic calcification. Diffuse interstitial coarsening has increased compared to 06/14/2023. Moderate left pleural effusion and left basilar  airspace opacities. Small right pleural effusion. No pneumothorax. No displaced rib fractures. IMPRESSION: 1. Left lower lobe pneumonia. Small right and moderate left pleural effusions. Electronically Signed   By: Norman Gatlin M.D.   On: 09/06/2023 22:45    Microbiology: No results found for this or any previous visit (from the past 240 hours).  Time spent: 30 minutes  Signed: Yetta Blanch, MD

## 2023-09-23 DEATH — deceased
